# Patient Record
Sex: Female | Born: 1935 | Race: White | Hispanic: No | Marital: Married | Smoking: Never smoker
Health system: Southern US, Community
[De-identification: ages and names within clinical notes are randomized; demographics above are authoritative.]

## PROBLEM LIST (undated history)

## (undated) DIAGNOSIS — R0600 Dyspnea, unspecified: Secondary | ICD-10-CM

## (undated) DIAGNOSIS — I119 Hypertensive heart disease without heart failure: Secondary | ICD-10-CM

## (undated) DIAGNOSIS — Z9289 Personal history of other medical treatment: Secondary | ICD-10-CM

## (undated) DIAGNOSIS — I872 Venous insufficiency (chronic) (peripheral): Secondary | ICD-10-CM

## (undated) DIAGNOSIS — E039 Hypothyroidism, unspecified: Secondary | ICD-10-CM

## (undated) DIAGNOSIS — R51 Headache: Secondary | ICD-10-CM

## (undated) DIAGNOSIS — R519 Headache, unspecified: Secondary | ICD-10-CM

## (undated) DIAGNOSIS — R32 Unspecified urinary incontinence: Secondary | ICD-10-CM

## (undated) DIAGNOSIS — E785 Hyperlipidemia, unspecified: Secondary | ICD-10-CM

## (undated) DIAGNOSIS — M1990 Unspecified osteoarthritis, unspecified site: Secondary | ICD-10-CM

## (undated) DIAGNOSIS — N183 Chronic kidney disease, stage 3 unspecified: Secondary | ICD-10-CM

## (undated) DIAGNOSIS — I10 Essential (primary) hypertension: Secondary | ICD-10-CM

## (undated) DIAGNOSIS — I2510 Atherosclerotic heart disease of native coronary artery without angina pectoris: Secondary | ICD-10-CM

## (undated) DIAGNOSIS — I5032 Chronic diastolic (congestive) heart failure: Secondary | ICD-10-CM

## (undated) DIAGNOSIS — I639 Cerebral infarction, unspecified: Secondary | ICD-10-CM

## (undated) DIAGNOSIS — E669 Obesity, unspecified: Secondary | ICD-10-CM

## (undated) DIAGNOSIS — C801 Malignant (primary) neoplasm, unspecified: Secondary | ICD-10-CM

## (undated) DIAGNOSIS — J189 Pneumonia, unspecified organism: Secondary | ICD-10-CM

## (undated) HISTORY — PX: CHOLECYSTECTOMY: SHX55

## (undated) HISTORY — PX: ABDOMINAL HYSTERECTOMY: SHX81

## (undated) HISTORY — PX: CORONARY STENT PLACEMENT: SHX1402

## (undated) HISTORY — DX: Unspecified osteoarthritis, unspecified site: M19.90

## (undated) HISTORY — PX: OTHER SURGICAL HISTORY: SHX169

## (undated) HISTORY — PX: ROTATOR CUFF REPAIR: SHX139

## (undated) HISTORY — DX: Cerebral infarction, unspecified: I63.9

---

## 1997-10-19 ENCOUNTER — Ambulatory Visit (HOSPITAL_COMMUNITY): Admission: RE | Admit: 1997-10-19 | Discharge: 1997-10-19 | Payer: Self-pay | Admitting: Internal Medicine

## 1998-10-18 ENCOUNTER — Encounter: Payer: Self-pay | Admitting: Internal Medicine

## 1998-10-18 ENCOUNTER — Ambulatory Visit (HOSPITAL_COMMUNITY): Admission: RE | Admit: 1998-10-18 | Discharge: 1998-10-18 | Payer: Self-pay | Admitting: Internal Medicine

## 1999-06-26 ENCOUNTER — Encounter: Payer: Self-pay | Admitting: Internal Medicine

## 1999-06-26 ENCOUNTER — Ambulatory Visit (HOSPITAL_COMMUNITY): Admission: RE | Admit: 1999-06-26 | Discharge: 1999-06-26 | Payer: Self-pay | Admitting: Internal Medicine

## 1999-08-30 ENCOUNTER — Encounter: Payer: Self-pay | Admitting: Cardiology

## 1999-08-30 ENCOUNTER — Encounter: Admission: RE | Admit: 1999-08-30 | Discharge: 1999-08-30 | Payer: Self-pay | Admitting: Cardiology

## 1999-09-04 ENCOUNTER — Ambulatory Visit (HOSPITAL_COMMUNITY): Admission: RE | Admit: 1999-09-04 | Discharge: 1999-09-05 | Payer: Self-pay | Admitting: Cardiology

## 1999-09-04 HISTORY — PX: CARDIAC CATHETERIZATION: SHX172

## 1999-10-25 ENCOUNTER — Encounter: Payer: Self-pay | Admitting: Internal Medicine

## 1999-10-25 ENCOUNTER — Ambulatory Visit (HOSPITAL_COMMUNITY): Admission: RE | Admit: 1999-10-25 | Discharge: 1999-10-25 | Payer: Self-pay | Admitting: Internal Medicine

## 2000-10-28 ENCOUNTER — Ambulatory Visit (HOSPITAL_COMMUNITY): Admission: RE | Admit: 2000-10-28 | Discharge: 2000-10-28 | Payer: Self-pay | Admitting: Internal Medicine

## 2000-10-28 ENCOUNTER — Encounter: Payer: Self-pay | Admitting: Internal Medicine

## 2001-03-25 ENCOUNTER — Ambulatory Visit (HOSPITAL_COMMUNITY): Admission: RE | Admit: 2001-03-25 | Discharge: 2001-03-25 | Payer: Self-pay | Admitting: Cardiovascular Disease

## 2001-03-25 ENCOUNTER — Encounter: Payer: Self-pay | Admitting: Cardiovascular Disease

## 2001-03-25 HISTORY — PX: CARDIAC CATHETERIZATION: SHX172

## 2001-06-18 ENCOUNTER — Encounter: Payer: Self-pay | Admitting: Internal Medicine

## 2001-06-18 ENCOUNTER — Ambulatory Visit (HOSPITAL_COMMUNITY): Admission: RE | Admit: 2001-06-18 | Discharge: 2001-06-18 | Payer: Self-pay | Admitting: Internal Medicine

## 2001-06-23 ENCOUNTER — Encounter: Admission: RE | Admit: 2001-06-23 | Discharge: 2001-06-23 | Payer: Self-pay | Admitting: Internal Medicine

## 2001-06-23 ENCOUNTER — Encounter: Payer: Self-pay | Admitting: Internal Medicine

## 2001-07-11 ENCOUNTER — Encounter: Admission: RE | Admit: 2001-07-11 | Discharge: 2001-07-11 | Payer: Self-pay | Admitting: Family Medicine

## 2001-07-11 ENCOUNTER — Encounter: Payer: Self-pay | Admitting: Internal Medicine

## 2001-09-24 ENCOUNTER — Inpatient Hospital Stay (HOSPITAL_COMMUNITY): Admission: RE | Admit: 2001-09-24 | Discharge: 2001-09-25 | Payer: Self-pay | Admitting: Neurosurgery

## 2001-10-30 ENCOUNTER — Ambulatory Visit (HOSPITAL_COMMUNITY): Admission: RE | Admit: 2001-10-30 | Discharge: 2001-10-30 | Payer: Self-pay | Admitting: Internal Medicine

## 2001-10-30 ENCOUNTER — Encounter: Payer: Self-pay | Admitting: Internal Medicine

## 2002-02-03 ENCOUNTER — Emergency Department (HOSPITAL_COMMUNITY): Admission: EM | Admit: 2002-02-03 | Discharge: 2002-02-03 | Payer: Self-pay | Admitting: *Deleted

## 2002-02-03 ENCOUNTER — Encounter: Payer: Self-pay | Admitting: Emergency Medicine

## 2002-02-10 ENCOUNTER — Ambulatory Visit (HOSPITAL_COMMUNITY): Admission: RE | Admit: 2002-02-10 | Discharge: 2002-02-10 | Payer: Self-pay | Admitting: Internal Medicine

## 2002-02-10 ENCOUNTER — Encounter: Payer: Self-pay | Admitting: Internal Medicine

## 2003-04-08 ENCOUNTER — Encounter: Payer: Self-pay | Admitting: Internal Medicine

## 2003-04-08 ENCOUNTER — Ambulatory Visit (HOSPITAL_COMMUNITY): Admission: RE | Admit: 2003-04-08 | Discharge: 2003-04-08 | Payer: Self-pay | Admitting: Internal Medicine

## 2003-05-04 ENCOUNTER — Ambulatory Visit (HOSPITAL_BASED_OUTPATIENT_CLINIC_OR_DEPARTMENT_OTHER): Admission: RE | Admit: 2003-05-04 | Discharge: 2003-05-04 | Payer: Self-pay | Admitting: Otolaryngology

## 2003-07-20 ENCOUNTER — Ambulatory Visit (HOSPITAL_COMMUNITY): Admission: RE | Admit: 2003-07-20 | Discharge: 2003-07-20 | Payer: Self-pay | Admitting: Otolaryngology

## 2003-07-20 ENCOUNTER — Ambulatory Visit (HOSPITAL_BASED_OUTPATIENT_CLINIC_OR_DEPARTMENT_OTHER): Admission: RE | Admit: 2003-07-20 | Discharge: 2003-07-20 | Payer: Self-pay | Admitting: Otolaryngology

## 2003-07-23 ENCOUNTER — Ambulatory Visit (HOSPITAL_COMMUNITY): Admission: RE | Admit: 2003-07-23 | Discharge: 2003-07-23 | Payer: Self-pay | Admitting: Internal Medicine

## 2004-04-18 ENCOUNTER — Ambulatory Visit (HOSPITAL_COMMUNITY): Admission: RE | Admit: 2004-04-18 | Discharge: 2004-04-18 | Payer: Self-pay | Admitting: Internal Medicine

## 2004-05-24 ENCOUNTER — Other Ambulatory Visit: Admission: RE | Admit: 2004-05-24 | Discharge: 2004-05-24 | Payer: Self-pay | Admitting: Obstetrics and Gynecology

## 2005-04-27 ENCOUNTER — Ambulatory Visit (HOSPITAL_COMMUNITY): Admission: RE | Admit: 2005-04-27 | Discharge: 2005-04-27 | Payer: Self-pay | Admitting: Internal Medicine

## 2005-08-03 ENCOUNTER — Ambulatory Visit (HOSPITAL_COMMUNITY): Admission: RE | Admit: 2005-08-03 | Discharge: 2005-08-03 | Payer: Self-pay | Admitting: Neurosurgery

## 2005-08-14 ENCOUNTER — Encounter: Admission: RE | Admit: 2005-08-14 | Discharge: 2005-08-14 | Payer: Self-pay | Admitting: Orthopedic Surgery

## 2005-08-15 ENCOUNTER — Ambulatory Visit (HOSPITAL_BASED_OUTPATIENT_CLINIC_OR_DEPARTMENT_OTHER): Admission: RE | Admit: 2005-08-15 | Discharge: 2005-08-16 | Payer: Self-pay | Admitting: Orthopedic Surgery

## 2005-08-15 ENCOUNTER — Ambulatory Visit (HOSPITAL_COMMUNITY): Admission: RE | Admit: 2005-08-15 | Discharge: 2005-08-15 | Payer: Self-pay | Admitting: Orthopedic Surgery

## 2006-06-18 ENCOUNTER — Ambulatory Visit (HOSPITAL_COMMUNITY): Admission: RE | Admit: 2006-06-18 | Discharge: 2006-06-18 | Payer: Self-pay | Admitting: Internal Medicine

## 2006-08-27 ENCOUNTER — Ambulatory Visit (HOSPITAL_COMMUNITY): Admission: RE | Admit: 2006-08-27 | Discharge: 2006-08-27 | Payer: Self-pay | Admitting: Internal Medicine

## 2006-08-29 ENCOUNTER — Ambulatory Visit: Payer: Self-pay | Admitting: Gastroenterology

## 2006-09-12 ENCOUNTER — Ambulatory Visit: Payer: Self-pay | Admitting: Gastroenterology

## 2006-09-25 ENCOUNTER — Ambulatory Visit: Payer: Self-pay | Admitting: Internal Medicine

## 2006-09-27 ENCOUNTER — Ambulatory Visit: Payer: Self-pay | Admitting: Internal Medicine

## 2006-11-07 ENCOUNTER — Ambulatory Visit: Payer: Self-pay | Admitting: Internal Medicine

## 2007-07-10 ENCOUNTER — Ambulatory Visit (HOSPITAL_COMMUNITY): Admission: RE | Admit: 2007-07-10 | Discharge: 2007-07-10 | Payer: Self-pay | Admitting: Internal Medicine

## 2008-04-29 ENCOUNTER — Encounter: Admission: RE | Admit: 2008-04-29 | Discharge: 2008-04-29 | Payer: Self-pay | Admitting: Internal Medicine

## 2008-09-02 ENCOUNTER — Encounter: Admission: RE | Admit: 2008-09-02 | Discharge: 2008-09-02 | Payer: Self-pay | Admitting: Internal Medicine

## 2008-09-17 HISTORY — PX: BLADDER SURGERY: SHX569

## 2009-03-23 ENCOUNTER — Encounter: Admission: RE | Admit: 2009-03-23 | Discharge: 2009-03-23 | Payer: Self-pay | Admitting: Internal Medicine

## 2009-05-02 ENCOUNTER — Ambulatory Visit (HOSPITAL_BASED_OUTPATIENT_CLINIC_OR_DEPARTMENT_OTHER): Admission: RE | Admit: 2009-05-02 | Discharge: 2009-05-02 | Payer: Self-pay | Admitting: Urology

## 2009-05-30 ENCOUNTER — Ambulatory Visit (HOSPITAL_COMMUNITY): Admission: RE | Admit: 2009-05-30 | Discharge: 2009-05-30 | Payer: Self-pay | Admitting: Urology

## 2009-06-29 ENCOUNTER — Ambulatory Visit (HOSPITAL_COMMUNITY): Admission: RE | Admit: 2009-06-29 | Discharge: 2009-06-30 | Payer: Self-pay | Admitting: Urology

## 2009-07-28 ENCOUNTER — Ambulatory Visit (HOSPITAL_BASED_OUTPATIENT_CLINIC_OR_DEPARTMENT_OTHER): Admission: RE | Admit: 2009-07-28 | Discharge: 2009-07-28 | Payer: Self-pay | Admitting: Urology

## 2009-09-14 ENCOUNTER — Ambulatory Visit (HOSPITAL_COMMUNITY): Admission: RE | Admit: 2009-09-14 | Discharge: 2009-09-14 | Payer: Self-pay | Admitting: Internal Medicine

## 2010-01-31 ENCOUNTER — Ambulatory Visit
Admission: RE | Admit: 2010-01-31 | Discharge: 2010-01-31 | Payer: Self-pay | Source: Home / Self Care | Admitting: Urology

## 2010-02-09 ENCOUNTER — Ambulatory Visit (HOSPITAL_BASED_OUTPATIENT_CLINIC_OR_DEPARTMENT_OTHER): Admission: RE | Admit: 2010-02-09 | Discharge: 2010-02-09 | Payer: Self-pay | Admitting: Urology

## 2010-07-31 ENCOUNTER — Encounter: Admission: RE | Admit: 2010-07-31 | Discharge: 2010-07-31 | Payer: Self-pay | Admitting: Internal Medicine

## 2010-10-17 ENCOUNTER — Ambulatory Visit (HOSPITAL_COMMUNITY)
Admission: RE | Admit: 2010-10-17 | Discharge: 2010-10-17 | Payer: Self-pay | Source: Home / Self Care | Attending: Internal Medicine | Admitting: Internal Medicine

## 2010-12-04 LAB — POCT I-STAT 4, (NA,K, GLUC, HGB,HCT)
Glucose, Bld: 107 mg/dL — ABNORMAL HIGH (ref 70–99)
HCT: 44 % (ref 36.0–46.0)
Hemoglobin: 15 g/dL (ref 12.0–15.0)
Potassium: 3.4 mEq/L — ABNORMAL LOW (ref 3.5–5.1)
Sodium: 141 mEq/L (ref 135–145)

## 2010-12-04 LAB — POCT HEMOGLOBIN-HEMACUE: Hemoglobin: 15.6 g/dL — ABNORMAL HIGH (ref 12.0–15.0)

## 2010-12-20 LAB — POCT I-STAT 4, (NA,K, GLUC, HGB,HCT)
Glucose, Bld: 107 mg/dL — ABNORMAL HIGH (ref 70–99)
HCT: 48 % — ABNORMAL HIGH (ref 36.0–46.0)
Hemoglobin: 16.3 g/dL — ABNORMAL HIGH (ref 12.0–15.0)
Potassium: 3.4 mEq/L — ABNORMAL LOW (ref 3.5–5.1)
Sodium: 137 mEq/L (ref 135–145)

## 2010-12-21 LAB — TYPE AND SCREEN
ABO/RH(D): A POS
Antibody Screen: NEGATIVE

## 2010-12-21 LAB — COMPREHENSIVE METABOLIC PANEL
ALT: 37 U/L — ABNORMAL HIGH (ref 0–35)
AST: 42 U/L — ABNORMAL HIGH (ref 0–37)
Albumin: 3.7 g/dL (ref 3.5–5.2)
Alkaline Phosphatase: 59 U/L (ref 39–117)
BUN: 19 mg/dL (ref 6–23)
CO2: 27 mEq/L (ref 19–32)
Calcium: 9.3 mg/dL (ref 8.4–10.5)
Chloride: 103 mEq/L (ref 96–112)
Creatinine, Ser: 1.19 mg/dL (ref 0.4–1.2)
GFR calc Af Amer: 54 mL/min — ABNORMAL LOW (ref >60)
GFR calc non Af Amer: 44 mL/min — ABNORMAL LOW (ref >60)
Glucose, Bld: 106 mg/dL — ABNORMAL HIGH (ref 70–99)
Potassium: 4.4 mEq/L (ref 3.5–5.1)
Sodium: 137 mEq/L (ref 135–145)
Total Bilirubin: 1 mg/dL (ref 0.3–1.2)
Total Protein: 6.7 g/dL (ref 6.0–8.3)

## 2010-12-21 LAB — CBC
HCT: 44.7 % (ref 36.0–46.0)
Hemoglobin: 15.1 g/dL — ABNORMAL HIGH (ref 12.0–15.0)
MCHC: 33.7 g/dL (ref 30.0–36.0)
MCV: 95.8 fL (ref 78.0–100.0)
Platelets: 239 10*3/uL (ref 150–400)
RBC: 4.67 MIL/uL (ref 3.87–5.11)
RDW: 13.2 % (ref 11.5–15.5)
WBC: 7.9 10*3/uL (ref 4.0–10.5)

## 2010-12-21 LAB — PROTIME-INR
INR: 1.04 (ref 0.00–1.49)
Prothrombin Time: 13.5 seconds (ref 11.6–15.2)

## 2010-12-21 LAB — APTT: aPTT: 24 seconds (ref 24–37)

## 2010-12-21 LAB — ABO/RH: ABO/RH(D): A POS

## 2010-12-22 LAB — BASIC METABOLIC PANEL
BUN: 15 mg/dL (ref 6–23)
CO2: 27 mEq/L (ref 19–32)
Calcium: 8.9 mg/dL (ref 8.4–10.5)
Chloride: 102 mEq/L (ref 96–112)
Creatinine, Ser: 0.87 mg/dL (ref 0.4–1.2)
GFR calc Af Amer: >60 mL/min (ref >60)
GFR calc non Af Amer: >60 mL/min (ref >60)
Glucose, Bld: 110 mg/dL — ABNORMAL HIGH (ref 70–99)
Potassium: 3.7 mEq/L (ref 3.5–5.1)
Sodium: 137 mEq/L (ref 135–145)

## 2010-12-22 LAB — HEMOGLOBIN AND HEMATOCRIT, BLOOD
HCT: 43.2 % (ref 36.0–46.0)
Hemoglobin: 14.6 g/dL (ref 12.0–15.0)

## 2011-01-30 NOTE — Op Note (Signed)
Jill Preston, Jill Preston             ACCOUNT NO.:  [id number]   MEDICAL RECORD NO.:  [id number]          PATIENT TYPE:  AMB   LOCATION:  [facility]                         FACILITY:  [facility]   PHYSICIAN:  Bertram Millard. Dahlstedt, M.D.DATE OF BIRTH:  07-06-1933   DATE OF PROCEDURE:  05/02/2009  DATE OF DISCHARGE:                               OPERATIVE REPORT   PREOPERATIVE DIAGNOSIS:  Stress urinary incontinence.   POSTOPERATIVE DIAGNOSIS:  Stress urinary incontinence.   PROCEDURE:  Lynx suprapubic sling.   SURGEON:  Bertram Millard. Dahlstedt, M.D.   ANESTHESIA:  General with LMA.   COMPLICATIONS:  None.   BRIEF HISTORY:  The patient is a 77 year old female with significant  urinary leakage.  She has mixed incontinence, and has fair response  anticholinergics.  Urodynamics revealed decreased bladder capacity, but  she had a compliant bladder.  There was stress urinary incontinence as  well as unstable contractions.  The patient desires to be aggressive  with management of her leakage.  She is aware that a sling would fix the  stress incontinence, but that she would more than likely need  anticholinergics to improve her leakage as well.  Risks and  complications of the procedure have been discussed with the patient in  full.  She understands these and desires to proceed.   DESCRIPTION OF PROCEDURE:  The patient was administered Cipro initially.  After a small amount of this drug was infused intravenously, there was  an urticarial reaction.  This was stopped.  She was then given 80 mg of  gentamicin IV.  The patient was identified and then taken to the  operating room where general ended was administered using LMA.  She was  placed in the dorsal lithotomy position.  Genitalia and perineum as well  as lower abdomen were prepped with a non-iodine containing solution.  She was draped.  Her bladder was drained with a Foley catheter, 39-  [place], that was left in place.  The balloon was evident at  the bladder  neck palpably through the vagina.  Two small punctures were made on  either side of the midline just overlying the pubic arch.  A Deaver was  then placed in the vagina, as the weighted speculum could not be placed  due to introital stenosis.  The urethral meatus was grasped with an  Allis clamp.  The submucosal tissue in the vagina anteriorly was  infiltrated with 1% lidocaine with epinephrine, approximately 4 mL were  used.  A midline incision was made along the anterior vaginal wall in  the midline just overlying the urethra.  This was dissected carefully  bilaterally underneath the vaginal epithelium.  I dissected up to the  pubocervical fascia bilaterally which could be easily palpable.  The  vaginal incision was approximately 2 cm long.  The tissue was quite  thin.  I then passed the retropubic needles through each puncture wound  through the space of Retzius, with the needles carefully hugging the  pubic bone.  These were brought out through the corresponding locations  on either side of the periurethral incision.  The bladder was then  inspected with a 70 degrees lens.  No punctures were seen within the  bladder.  The urethra was normal as well.  The lynx sling was then  grasped and brought through the retropubic space by pulling the needles  anteriorly.  The sheath was cut and the plastic sheath removed.  The  sling was left in place with a fair amount of slack between the urethra  and the sling.  This was enough slack to easily admit a right angle with  extra space in between the sling and the right angle clamp.  The vaginal  mucosa was then irrigated.  It was carefully closed with a running 2-0  Vicryl.  Care was taken to evert the skin edges to avoid extrusion of  the sling later on.  The sling was then clipped below the skin  bilaterally and pubic area with the sling pushed into the subdermal  tissue.  Dermabond was used to close the small puncture sites.  A   Estrace impregnated pack was then left within the vagina.  Approximately  150 mL of water was left in the bladder following removal of the  cystoscope.  The procedure was then terminated.  The patient was  awakened and taken to PACU in stable condition.      Bertram Millard. Dahlstedt, M.D.  Electronically Signed     SMD/MEDQ  D:  05/02/2009  T:  05/02/2009  Job:  [id number]

## 2011-02-02 NOTE — Op Note (Signed)
NAME:  Jill Preston, RISK NO.:  [id number]   MEDICAL RECORD NO.:  [id number]          PATIENT TYPE:  AMB   LOCATION:  DSC                          FACILITY:  [facility]   PHYSICIAN:  Lockie Pares, M.D.    DATE OF BIRTH:  1933/01/27   DATE OF PROCEDURE:  08/15/2005  DATE OF DISCHARGE:                                 OPERATIVE REPORT   INDICATION:  The patient is a 77 year old female who presented to my office  with intractable shoulder pain and MRI-proven symptomatic rotator cuff tear  felt to be amenable to outpatient surgery.   PREOPERATIVE DIAGNOSES:  1.  Torn right rotator cuff (5-cm tear).  2.  Impingement.  3.  Acromioclavicular joint arthritis.  4.  Degenerative tear of anterior and superior labrum with involvement of      biceps.   OPERATION:  1.  Open rotator cuff repair and open acromioplasty.  2.  Arthroscopic debridement of torn labrum.  3.  Open excision of distal clavicle.   SURGEON:  Lockie Pares, M.D.   ANESTHESIA:  General.   DESCRIPTION OF PROCEDURE:  Sterile prep and drape.  Normal range of motion,  no instability.  She was arthroscoped through a posterior, lateral and  anterior portal.  Intra-articular inspection showed no degenerative changes  of the glenoid or humerus.  Attritional-type tearing with flattening of the  biceps, degenerative tear of the labrum, extensive debridement carried out,  large rotator cuff identified.  Procedure quickly converted to an open  procedure, incision bisecting the [facility] joint acromion, splitting the deltoid 2  cm distal to the tip of the acromion, excision of very hypertrophic distal  clavicle as well as a severely thickened an type 3 acromion.  Cuff tear was  freshened with a 15 blade.  Bursectomy carried out, tuberosity freshened,  followed by insertion of two 5.5-mm Arthrex absorbable anchors, each with #2  FiberWires attached with a total of 4 sutures to create an essentially  watertight repair of the cuff  under no tension.  Irrigation was carried out.  Closure of the deltoid, which had been split, with meticulous repair with #1  Tycron including 2 sutures through drill holes, lightly compressive sterile  dressing, mini-pillows were applied, taken to the recovery room in stable  condition.      Lockie Pares, M.D.  Electronically Signed     WDC/MEDQ  D:  08/15/2005  T:  08/16/2005  Job:  [id number]

## 2011-02-02 NOTE — Op Note (Signed)
Jill Preston, Jill Preston                         ACCOUNT NO.:  [id number]   MEDICAL RECORD NO.:  [id number]                   PATIENT TYPE:  AMB   LOCATION:  DSC                                  FACILITY:  [facility]   PHYSICIAN:  Christopher E. Ezzard Standing, M.D.         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  Turbinate hypertrophy.   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  Turbinate hypertrophy.   OPERATION PERFORMED:  Septoplasty with bilateral inferior turbinate  reductions.   SURGEON:  Kristine Garbe. Ezzard Standing, M.D.   ANESTHESIA:  General endotracheal.   COMPLICATIONS:  None.   INDICATIONS FOR PROCEDURE:  Jill Preston is a 77 year old female who  apparently fell on her face and nose back in December.  Since that time, she  has had difficulty breathing through the left side of her nose.  On  examination, she has a significant septal deviation to the left with 70 to  80% closure of the left nasal passageway compared to the right.  She has  moderate sized turbinates.  The patient was taken to the operating room at  this time for septoplasty and turbinate reductions.   DESCRIPTION OF PROCEDURE:  After adequate endotracheal anesthesia, the  patient received 1g Ancef IV preoperatively.  The nose was prepped with  Betadine solution and draped out with sterile towels.  The nose was then  injected with Xylocaine with epinephrine for hemostasis. The patient  had a  severe deviation of the septum to the left, obstructing the left nasal  airway.  A hemitransfixion incision was made along the caudal edge of the  septum on the left side, mucoperichondrial and mucoperiosteal flaps were  elevated posteriorly.  The portion of the septum that was deviated was  mostly at the juncture of the  bony cartilaginous septum.  This portion of  the septum was removed  after elevating mucoperichondrial and mucoperiosteal  flaps on either side.  This allowed the remaining septum to return more to  midline.  In addition, the patient had a portion of the cartilaginous septum  bowed into the right nasal airway off of the maxillary crest and this  portion protruding into the right airway was excised and cauterized.  Hemitransfixion incision was closed with interrupted 5-0 chromic sutures.  The septum was basted with a 3-0 chromic suture.  Next, inferior turbinate  reductions were performed on the right side.  ______ was used to perform  submucosal bipolar cauterization of the inferior turbinate.  On the left  side, incision was made along the inferior edge of the turbinate.  Mucosal  flaps were elevated off of the turbinate bone and turbinate bone was  removed.  After removing turbinate bone, submucosal cauterization was  performed.  The remaining turbinate tissue was outfractured.  This completed  the procedure.  Splints were secured to either side of the septum with a 3-0  nylon suture.  Nose was then packed with Telfa soaked in bacitracin  ointment.    DISPOSITION:  Jill Preston is discharged home later this morning on  Tylenol and Vicodin as needed for pain, Keflex 500 mg twice daily for one  week.  Will have her follow up in my office tomorrow to have her nasal packs  removed.                                               Kristine Garbe. Ezzard Standing, M.D.    CEN/MEDQ  D:  05/04/2003  T:  05/04/2003  Job:  [id number]   cc:   Lucky Cowboy, M.D.  [address]  [place], [place] [address]  Fax: [phone]

## 2011-02-02 NOTE — Assessment & Plan Note (Signed)
[place]                             PULMONARY OFFICE NOTE   SHANIGUA, DURELL                    MRN:          [id number]  DATE:09/25/2006                            DOB:          [birth year]    PULMONARY/NEW PATIENT EVALUATION   CHIEF COMPLAINT:  Dyspnea.   HISTORY:  A 77 year old white female who has been having trouble with  her breathing for 3 or 4 years and typically loses her voice when she  loses her breath. This happened again around Thanksgiving and left her  with a persistent sensation that she could not get her breath. She  also had profound hoarseness. She tells me that she was evaluated by Dr.  Radene Journey and was found to have no problems. She was not treated  with anything and ended up in the ER and admitted to [facility]  five days ago with elevated blood pressure, but I gather no pulmonary  medicines. For some reason, she was discharged on prednisone however.  She says she is better since the prednisone was started. She denies any  pleuritic pain, fevers, chills, sweats, orthopnea, PND or leg swelling.   PAST MEDICAL HISTORY:  Significant for:  1. Remote cholecystectomy.  2. Neck surgery.  3. Angioplasty.  4. Hysterectomy.  5. She also has a history of hypertension, but denies being on ACE      inhibitors.   MEDICATIONS:  Reviewed in detail on the worksheet. Significant for the  fact that she is not on ACE inhibitors. See column dated September 25, 2006, for details.   SOCIAL HISTORY:  She has never smoked. Denies any unusual travel, pet or  hobby exposure.   FAMILY HISTORY:  Is recorded in detail on the worksheet. Significant for  the absence of respiratory diseases.   REVIEW OF SYSTEMS:  Taken in detail on the worksheet. Significant only  for upper airway complaints.   PHYSICAL EXAMINATION:  This is a hoarse, ambulatory, white female with  classic voice fatigue. She is afebrile with stable vital signs.  HEENT: Reveals upper and lower dentures in place. Oropharynx was  completely clear without any evidence of excessive post-nasal drainage  or cobblestoning.  NECK: Supple without cervical adenopathy or tenderness. Trachea is  midline. No thyromegaly.  Lung fields perfectly clear bilaterally to auscultation and percussion.  She had classic pseudo wheeze.  HEART: Regular rate and rhythm without murmur, gallop or rub present.  ABDOMEN: Soft, benign.  EXTREMITIES: Warm without calf tenderness, cyanosis, clubbing or edema.   IMPRESSION:  Upper airways dysfunction, either functional related to  reflux and interestingly at least partially responsive to prednisone. I  would have bet that she was on ACE inhibitors, but she denied this and  did give a very accurate inventory of all of her medications.   Since ACE inhibitors and sinus disease do not appear to be contributing  to her upper airway problem, I am going to recommend treatment directed  at reflux namely use Zegerid 40 mg at bedtime, along with dietary  modifications and followup here in 4 to 6 weeks with PFTs.  If her  condition deteriorates in the meantime, I have asked her to return here  immediately.   In the meantime, I will try to obtain all of her records from her recent  [facility] admission which included apparently every x-ray and blood test  you can name (she actually could name none of them).   09/26/2006 Reviewed record from [facility] indicates she was admitted on  quinapril and discharged off it. Contacted patient by phone who says she  thinks she's still taking it and doesn't have the discharge list of meds  from [facility] and doesn't really know what to take. I asked her to stop  the quinapril (she's already taken today's dose) and return tomorrow for  med reconciliation.     Christena Deem. Melvyn Novas, MD, [facility]  Electronically Signed    MBW/MedQ  DD: 09/25/2006  DT: 09/25/2006  Job #: [id number]

## 2011-02-02 NOTE — Op Note (Signed)
[place]  Patient:    AIDALY, CORDNER Visit Number: [id number] MRN: [id number]          Service Type: SUR Location: [id number] Attending Physician:  Cristi Loron Proc. Date: 09/24/01 Admit Date:  09/24/2001 Discharge Date: 09/25/2001                             Operative Report  INDICATION:  The patient is a 77 year old white female who suffers from neck and arm pain.  She failed medical management and was worked up as an outpatient with a cervical MRI which demonstrated a herniated disk with spondylosis at C5-6 and C6-7.  I discussed the various treatment options with her including surgery.  The patient weighed the benefits, benefits, and alternatives to surgery, and decided to proceed with the operation.  PREOPERATIVE DIAGNOSES:  C5-6 and C6-7 spondylosis, herniated stenosis, and cervical radiculopathy.  POSTOPERATIVE DIAGNOSES:  C5-6 and C6-7 spondylosis, herniated stenosis, and cervical radiculopathy.  PROCEDURE PERFORMED:  C5-6 and C6-7 extensive anterior cervical diskectomy, interbody iliac crest allograft arthrodesis, anterior cervical plating (Codman titanium plate and screws).  SURGEON:  Cristi Loron, M.D.  ASSISTANT:  Stefani Dama, M.D.  ANESTHESIA:  General endotracheal.  ESTIMATED BLOOD LOSS:  150 cc.  SPECIMENS:  None.  DRAINS:  None.  COMPLICATIONS:  None.  DESCRIPTION OF PROCEDURE:  The patient was brought to the operating room by the anesthesia team.  General endotracheal anesthesia was induced.  The patient remained in the supine position.  A roll was placed under her shoulders to place her neck in slight extension.  Her anterior cervical region was then prepared with Betadine scrub and Betadine solution.  Sterile drapes were applied.  I then injected the area to be incised with Marcaine with epinephrine solution.  I then made the transverse incision in the patients left anterior neck.  I  used the Metzenbaum scissors to divide the platysma muscle and divide medial to the sternocleidomastoid muscle, jugular vein, and carotid artery.  I bluntly dissected down towards the anterior cervical spine carefully identifying the esophagus and retracting it medially.  I cleared the soft tissues from the anterior cervical spine using swabs and then inserted a bent spinal needle into the upper exposed interspace.  I obtained intraoperative radiographs to confirm our location and then used electrocautery to detach the medial border of the longus coli muscle bilaterally from the C5-6 and C6-7 intervertebral disk.  The caspar self-retaining retractor for exposure.  I then incised the C5-6 intervertebral disk and performed the partial diskectomy using the pituitary forceps.  I inserted distraction screws at C5 and C6.  I then distracted the interspace. I then used a high speed drill to decorticate the vertebral end plate of M5-7 and drilled away the remainder of the intervertebral disk.  I then brought out the partial posterior longitudinal ligament with the drill and drilled out some bone spurs from the posterior bone edges.  I then incised the posterior longitudinal ligament with the knife and removed it with Kerrison punch undercutting the vertebral end plate of Q4-6, decompressing the thecal sac.  I then performed a foraminotomy about the bilateral C6 nerve root.  I then repeated the procedure at C6-7.  I removed the distraction screw at C5 placed at C7, distracted the C6-7 interspace, incising the vertebral disk for a partial diskectomy with the pituitary forceps and karlin curets using a high speed drill  to decorticate the vertebral end plate of Z6-1, thinning out the posterior longitudinal ligament, and drilled away some bone spurs.  I incised the ligament with the knife and then removed it with a Kerrison punch on the vertebral end plate of W9-6 decompressing the thecal sac.  I then  performed a foraminotomy about the bilateral C7 nerve root.  I completed the diskectomy and I then turned my attention to the anterior spinal fusion/arthrodesis.  I obtained the iliac crest tricortical allograft bone graft and fashioned them to their proximal dimensions.  Approximately 7 height and 1 cm in depth.  I inserted one bone graft in the tract of C6-7 interspace and then removed it from C7 and placed it back in C5, distracted the C5-6 interspace and placed the bone graft into the C5-6 interspace.  There was a good fit of bone graft at both levels.  I then turned my attention to the anterior spinal instrumentation.  I obtained the appropriate length of the anterior cervical plate, and laid it along the anterior aspect of the vertebral body from C5 to C7 and then drilled two holes in C5, two in C6, two in C7, tapped the holes, and secured the plate to the vertebral bodies with two 15 mm screws at each vertebral body.  I then obtained a radiograph with limited exposure particularly with the lower edge of the plate, but it looked to be in good position.  I then secured the screws to the plate using the can tightener at each screws and achieved hemostasis using bipolar electrocautery and Gelfoam.  I removed the Caspar retractor and then inspected the esophagus for any damage, there was none apparent.  I then reapproximated the patients platysma muscle with interrupted 3-0 Vicryl suture, subcutaneous tissue with interrupted 3-0 Vicryl suture, and the skin with Steri-Strips and Benzoin. The wound was then coated with bacitracin ointment.  A sterile dressing was applied.  The patient was subsequently extubated by the anesthesia team and transported to the post anesthesia care unit in stable condition.  All sponge, instrument, and needle counts were correct at the end of the case. Attending Physician:  Tressie Stalker D DD:  09/24/01 TD:  09/25/01 Job: [id number]

## 2011-02-02 NOTE — Cardiovascular Report (Signed)
[place]  Patient:    Jill Preston, Jill Preston                    MRN: [id number] Proc. Date: 03/25/01 Adm. Date:  [id number] Attending:  Virgina Evener CC:         Madaline Savage, M.D.  Tildon Husky, M.D.   Cardiac Catheterization  INDICATIONS:  The patient is a 77 year old, white female, patient of Dr. Elsie Lincoln, who had previous undergone PTCA and stenting of the proximal to mid LAD in December of 2000.  The patient had recently developed chest and left arm discomfort.  She saw Dr. Elsie Lincoln yesterday in the office.  She had recently had a Cardiolite stress test suggesting possible mild anterior wall ischemia.  She is now referred for definitive diagnostic catheterization.  PROCEDURES:  Left heart catheterization, cine coronary angiography, biplane left ventriculography, distal aortography.  HEMODYNAMIC DATA:  Central aortic pressure 138/59, mean 92, left ventricular pressure 138/14, post A wave 25.  ANGIOGRAPHIC DATA:  Left main coronary artery:  The left main coronary artery was a long normal vessel that trifurcated into an LAD, a ramus intermediate branch and a left circumflex coronary artery.  Left anterior descending:  The LAD extended to the LV apex.  Between the first and second diagonal vessel was the site of the prior LAD stent.  This was widely patent with a residual narrowing of no greater than 10%.  There was a midportion of the LAD that seemed to dip intramyocardial.  There was no significant stenoses.  The ramus intermediate vessel was angiographically normal.  Circumflex artery:  The circumflex vessel was angiographically normal and gave rise to one marginal vessel.  Right coronary artery:  The right coronary artery was angiographically normal and had a proximal conus branch.  The vessel supplied the PDA, two inferior lv branches and ended as a posterolateral vessel.  The RCA was  angiographically normal.  Biplane cine left ventriculography revealed normal LV function without focal segmental wall motion abnormality.  DISTAL AORTOGRAPHY:  Distal aortography did not demonstrate any renal artery stenosis in this patient with a history of hypertension.  There was no significant aortoiliac disease.  IMPRESSION: 1. Normal left ventricular function. 2. No evidence for re-stenosis at the prior proximal left anterior descending    stent site with residual narrowing of less than 10%, intimal hyperplasia. 3. Normal ramus intermediate, circumflex and right coronary arteries. 4. No evidence for renal artery stenosis. DD:  03/25/01 TD:  03/25/01 Job: [id number]

## 2011-02-02 NOTE — Assessment & Plan Note (Signed)
[facility]                             PULMONARY OFFICE NOTE   Jill Preston, Jill Preston                    MRN:          [id number]  DATE:11/07/2006                            DOB:          December 26, 1932    HISTORY:  This patient was initially seen on September 25, 2006, with  choking spells that I thought were consistent with upper airway  obstruction from either reflux or ACE inhibitors.  She did not actually  give the history that she was on Quinapril but we found out later that  she did, indeed, take it and on the 11th, it was stopped.  We treated  her empirically for GERD with Zegerid 40 mg at bedtime and she says she  is 100% better at this point; however, she does continue to audibly  clear her throat during the interview and exam.  Her husband says she  notices it but she actually does not perceive it as a problem.  She  denies any excess nasal complaints, overt reflux symptoms, fevers,  chills, sweats, orthopnea, PND, or leg swelling.   For a full list of medications, please see face sheet on the column  dated November 07, 2006, noting that she is now on Benicar 40 mg per day  in the place of Quinapril, and note that her medications now are  reflected on a medication calendar which corresponds 100% to the  medication face sheet on the column dated November 07, 2006, and was  reviewed with her in detail and is correct as listed.   PHYSICAL EXAMINATION:  GENERAL:  She is an obese ambulatory white female  in no acute distress who clears her throat frequently during the exam.  VITAL SIGNS:  Blood pressure 126/82.  HEENT:  Her oropharynx is clear without any excessive post nasal  drainage or cobblestoning.  NECK:  Supple without cervical adenopathy or tenderness.  Trachea is  midline.  LUNGS:  Fields clear bilaterally to auscultation and percussion.  HEART:  Regular rate and rhythm without murmur, gallop or rub.  ABDOMEN:  Soft, benign.  EXTREMITIES:  Warm without calf tenderness, cyanosis, clubbing, or  edema.   IMPRESSION:  1. Continued throat pain is the only finding six weeks after stopping      Quinapril with no further spells of dyspnea or cough.  At this      point, I am not sure reflux is even an issue and recommended she      empirically stop the Zegerid to see if the problem flares.  If so,      a GI referral may be necessary.  2. Blood pressure is well controlled on Benicar in the place of      Quinapril.  3. I did review lifestyle issues with her, as well, in terms of diet      and encouraged her to use sugarless candy instead of menthol      containing lozenges to suppress the urge to clear her throat.   I also reviewed with her her PFTs today which are basically normal  except for slight truncation of the  inspiratory loop, which would  normally give me concern but note that Dr. Radene Journey has already  evaluated her upper airway and feels that it is normal.   The patient plans to establish with Dr. Tomasa Hosteller.  I will send him this  note which summarizes my previous evaluation.  Pulmonary follow up can  be p.r.n.     Jill Preston. Melvyn Novas, MD, [facility]  Electronically Signed    MBW/MedQ  DD: 11/07/2006  DT: 11/07/2006  Job #: [id number]   cc:   Ardeen Jourdain, M.D.

## 2011-02-02 NOTE — Assessment & Plan Note (Signed)
[facility]                             PULMONARY OFFICE NOTE   Jill Preston, Jill Preston                    MRN:          [id number]  DATE:09/27/2006                            DOB:          03-27-1933    Patient is a 77 year old white female patient of Dr. Thurston Hole who was seen  in the office 2 days ago for pulmonary consult for worsening shortness  of breath and cough.  Patient was recently admitted at [facility]  from January 4-January 7 for atypical chest pain with rule out cardiac  event, and was ruled out for cardiac event.  Patient also had an acute  tracheobronchitis, and was treated with antibiotics and prednisone  taper.  Since discharge, patient reports that she has had decreased  cough, congestion, and shortness of breath.  Patient does complain that  she continues to have a persistent hoarseness and throat clearing.  Patient has brought all of her medications in today for review.  Patient  had been suspected to have some upper airway instability, possibly  secondary to reflux.  Zegerid was added into her regimen 2 days ago.  Patient denies any chest pain, purulent sputum, fever, orthopnea, PND or  leg swelling.  Patient has brought all of her medications in today for  review.  Patient has multiple discrepancies, including several old  bottles of medications, and several medications that were not previously  listed on her medication list.   PAST MEDICAL HISTORY:  Reviewed.   CURRENT MEDICATIONS:  Reviewed.   PHYSICAL EXAMINATION:  Patient is a pleasant female in no acute  distress.  She is afebrile with stable vital signs.  O2 saturation is  94% on room air.  HEENT:  Posterior pharynx is clear without any exudate or redness.  TMs  are normal.  EACs are clear.  Nasal mucosa is slightly pale with some  turbinate edema.  NECK:  Supple without cervical adenopathy.  No JVD.  LUNGS:  Clear to auscultation bilaterally without any wheezes,  no  crackles.  CARDIAC:  Regular rate and rhythm.  ABDOMEN:  Soft without any hepatosplenomegaly, no guarding or rebound.  EXTREMITIES:  Warm without any calf tenderness, cyanosis, clubbing, or  edema.   IMPRESSION:  1. Upper airway instability, possibly secondary to reflux and/or      angiotensin-converting enzyme inhibitor.  Patient will stop her      quinapril presently and begin Benicar 40 mg daily.  She will      continue on Zegerid at bedtime.  Patient also has a sample of      Advair which she is unsure if she has been taking or not.  I      recommended to hold on this medication for now since it is not      listed on any of her medication lists nor her discharge summary      from [facility].  This can also contribute to upper airway      instability.  Patient does have a followup appointment in 4 weeks      for a scheduled pulmonary function test, and  we will determine if      any inhalers are needed at that time.  2. Hypertension.  Will recheck blood pressure at followup visit in 4      weeks on new medication of Benicar 40 daily.  3. Complex medication regimen.  Patient's medications were reviewed in      detail.  Patient education was provided.  Patient, as above, has      multiple discrepancies in her medicines.  Patient has discarded all      medication bottles that are expired, and has been given a      computerized medication list of her medications, and patient      verbalizes understanding and awareness to bring list back to each      and every visit.      Rubye Oaks, NP  Electronically Signed      Charlaine Dalton. Sherene Sires, MD, [facility]  Electronically Signed   TP/MedQ  DD: 10/01/2006  DT: 10/01/2006  Job #: [id number]

## 2011-02-27 ENCOUNTER — Other Ambulatory Visit: Payer: Self-pay | Admitting: Adult Health Nurse Practitioner

## 2011-02-27 ENCOUNTER — Ambulatory Visit
Admission: RE | Admit: 2011-02-27 | Discharge: 2011-02-27 | Disposition: A | Discharge status: Home / Self Care | Payer: Medicare Other | Source: Ambulatory Visit | Attending: Adult Health Nurse Practitioner | Admitting: Adult Health Nurse Practitioner

## 2011-02-27 DIAGNOSIS — R52 Pain, unspecified: Secondary | ICD-10-CM

## 2011-05-01 ENCOUNTER — Ambulatory Visit (HOSPITAL_BASED_OUTPATIENT_CLINIC_OR_DEPARTMENT_OTHER)
Admission: RE | Admit: 2011-05-01 | Discharge: 2011-05-01 | Disposition: A | Discharge status: Home / Self Care | Payer: Medicare Other | Source: Ambulatory Visit | Attending: Urology | Admitting: Urology

## 2011-05-01 ENCOUNTER — Ambulatory Visit (HOSPITAL_COMMUNITY): Payer: Medicare Other | Attending: Urology

## 2011-05-01 DIAGNOSIS — Z01812 Encounter for preprocedural laboratory examination: Secondary | ICD-10-CM | POA: Insufficient documentation

## 2011-05-01 DIAGNOSIS — N3941 Urge incontinence: Secondary | ICD-10-CM | POA: Insufficient documentation

## 2011-05-01 DIAGNOSIS — I10 Essential (primary) hypertension: Secondary | ICD-10-CM | POA: Insufficient documentation

## 2011-05-01 DIAGNOSIS — Y831 Surgical operation with implant of artificial internal device as the cause of abnormal reaction of the patient, or of later complication, without mention of misadventure at the time of the procedure: Secondary | ICD-10-CM | POA: Insufficient documentation

## 2011-05-01 DIAGNOSIS — R9431 Abnormal electrocardiogram [ECG] [EKG]: Secondary | ICD-10-CM | POA: Insufficient documentation

## 2011-05-01 DIAGNOSIS — Z79899 Other long term (current) drug therapy: Secondary | ICD-10-CM | POA: Insufficient documentation

## 2011-05-01 DIAGNOSIS — T85890A Other specified complication of nervous system prosthetic devices, implants and grafts, initial encounter: Secondary | ICD-10-CM | POA: Insufficient documentation

## 2011-05-01 DIAGNOSIS — T8389XA Other specified complication of genitourinary prosthetic devices, implants and grafts, initial encounter: Secondary | ICD-10-CM | POA: Insufficient documentation

## 2011-05-01 DIAGNOSIS — R109 Unspecified abdominal pain: Secondary | ICD-10-CM | POA: Insufficient documentation

## 2011-05-01 DIAGNOSIS — I2510 Atherosclerotic heart disease of native coronary artery without angina pectoris: Secondary | ICD-10-CM | POA: Insufficient documentation

## 2011-05-01 LAB — POCT I-STAT 4, (NA,K, GLUC, HGB,HCT)
Glucose, Bld: 99 mg/dL (ref 70–99)
HCT: 44 % (ref 36.0–46.0)
Hemoglobin: 15 g/dL (ref 12.0–15.0)
Potassium: 3.5 mEq/L (ref 3.5–5.1)
Sodium: 140 mEq/L (ref 135–145)

## 2011-07-05 ENCOUNTER — Other Ambulatory Visit: Payer: Self-pay | Admitting: Family Medicine

## 2011-07-05 ENCOUNTER — Ambulatory Visit
Admission: RE | Admit: 2011-07-05 | Discharge: 2011-07-05 | Disposition: A | Discharge status: Home / Self Care | Payer: Medicare Other | Source: Ambulatory Visit | Attending: Family Medicine | Admitting: Family Medicine

## 2011-07-05 DIAGNOSIS — R05 Cough: Secondary | ICD-10-CM

## 2011-07-05 DIAGNOSIS — R059 Cough, unspecified: Secondary | ICD-10-CM

## 2011-07-05 DIAGNOSIS — IMO0001 Reserved for inherently not codable concepts without codable children: Secondary | ICD-10-CM

## 2011-07-20 ENCOUNTER — Inpatient Hospital Stay (HOSPITAL_COMMUNITY)
Admission: EM | Admit: 2011-07-20 | Discharge: 2011-07-24 | DRG: 689 | Disposition: A | Discharge status: Home / Self Care | Payer: Medicare Other | Attending: Internal Medicine | Admitting: Internal Medicine

## 2011-07-20 ENCOUNTER — Observation Stay (HOSPITAL_COMMUNITY): Payer: Medicare Other

## 2011-07-20 DIAGNOSIS — J189 Pneumonia, unspecified organism: Secondary | ICD-10-CM | POA: Diagnosis present

## 2011-07-20 DIAGNOSIS — E872 Acidosis, unspecified: Secondary | ICD-10-CM | POA: Diagnosis present

## 2011-07-20 DIAGNOSIS — Z7982 Long term (current) use of aspirin: Secondary | ICD-10-CM

## 2011-07-20 DIAGNOSIS — E039 Hypothyroidism, unspecified: Secondary | ICD-10-CM

## 2011-07-20 DIAGNOSIS — N390 Urinary tract infection, site not specified: Principal | ICD-10-CM | POA: Diagnosis present

## 2011-07-20 DIAGNOSIS — I2510 Atherosclerotic heart disease of native coronary artery without angina pectoris: Secondary | ICD-10-CM | POA: Diagnosis present

## 2011-07-20 DIAGNOSIS — I951 Orthostatic hypotension: Secondary | ICD-10-CM | POA: Diagnosis present

## 2011-07-20 DIAGNOSIS — E785 Hyperlipidemia, unspecified: Secondary | ICD-10-CM | POA: Diagnosis present

## 2011-07-20 DIAGNOSIS — I10 Essential (primary) hypertension: Secondary | ICD-10-CM | POA: Diagnosis present

## 2011-07-20 DIAGNOSIS — Z91040 Latex allergy status: Secondary | ICD-10-CM

## 2011-07-20 DIAGNOSIS — Z880 Allergy status to penicillin: Secondary | ICD-10-CM

## 2011-07-20 DIAGNOSIS — N179 Acute kidney failure, unspecified: Secondary | ICD-10-CM | POA: Diagnosis present

## 2011-07-20 DIAGNOSIS — Z9861 Coronary angioplasty status: Secondary | ICD-10-CM

## 2011-07-20 LAB — URINALYSIS, ROUTINE W REFLEX MICROSCOPIC
Bilirubin Urine: NEGATIVE
Glucose, UA: NEGATIVE mg/dL
Hgb urine dipstick: NEGATIVE
Ketones, ur: NEGATIVE mg/dL
Nitrite: NEGATIVE
Protein, ur: NEGATIVE mg/dL
Specific Gravity, Urine: 1.018 (ref 1.005–1.030)
Urobilinogen, UA: 0.2 mg/dL (ref 0.0–1.0)
pH: 5.5 (ref 5.0–8.0)

## 2011-07-20 LAB — COMPREHENSIVE METABOLIC PANEL
ALT: 22 U/L (ref 0–35)
AST: 22 U/L (ref 0–37)
Albumin: 3.9 g/dL (ref 3.5–5.2)
Alkaline Phosphatase: 94 U/L (ref 39–117)
BUN: 14 mg/dL (ref 6–23)
CO2: 16 mEq/L — ABNORMAL LOW (ref 19–32)
Calcium: 9.2 mg/dL (ref 8.4–10.5)
Chloride: 100 mEq/L (ref 96–112)
Creatinine, Ser: 0.7 mg/dL (ref 0.50–1.10)
GFR calc Af Amer: >90 mL/min (ref >90)
GFR calc non Af Amer: 83 mL/min — ABNORMAL LOW (ref >90)
Glucose, Bld: 116 mg/dL — ABNORMAL HIGH (ref 70–99)
Potassium: 3.8 mEq/L (ref 3.5–5.1)
Sodium: 137 mEq/L (ref 135–145)
Total Bilirubin: 0.2 mg/dL — ABNORMAL LOW (ref 0.3–1.2)
Total Protein: 6.5 g/dL (ref 6.0–8.3)

## 2011-07-20 LAB — URINE MICROSCOPIC-ADD ON

## 2011-07-20 LAB — CBC
HCT: 39.5 % (ref 36.0–46.0)
Hemoglobin: 13.5 g/dL (ref 12.0–15.0)
MCH: 29.8 pg (ref 26.0–34.0)
MCHC: 34.2 g/dL (ref 30.0–36.0)
MCV: 87.2 fL (ref 78.0–100.0)
Platelets: 255 10*3/uL (ref 150–400)
RBC: 4.53 MIL/uL (ref 3.87–5.11)
RDW: 13.8 % (ref 11.5–15.5)
WBC: 9.4 10*3/uL (ref 4.0–10.5)

## 2011-07-20 LAB — LIPASE, BLOOD: Lipase: 56 U/L (ref 11–59)

## 2011-07-21 LAB — BASIC METABOLIC PANEL
BUN: 35 mg/dL — ABNORMAL HIGH (ref 6–23)
CO2: 31 mEq/L (ref 19–32)
Calcium: 9.5 mg/dL (ref 8.4–10.5)
Chloride: 99 mEq/L (ref 96–112)
Creatinine, Ser: 2.05 mg/dL — ABNORMAL HIGH (ref 0.50–1.10)
GFR calc Af Amer: 26 mL/min — ABNORMAL LOW (ref >90)
GFR calc non Af Amer: 23 mL/min — ABNORMAL LOW (ref >90)
Glucose, Bld: 100 mg/dL — ABNORMAL HIGH (ref 70–99)
Potassium: 3 mEq/L — ABNORMAL LOW (ref 3.5–5.1)
Sodium: 140 mEq/L (ref 135–145)

## 2011-07-21 LAB — URINE CULTURE
Colony Count: 75000
Culture  Setup Time: 201211030052

## 2011-07-21 LAB — CBC
HCT: 39.6 % (ref 36.0–46.0)
Hemoglobin: 13.1 g/dL (ref 12.0–15.0)
MCH: 33.2 pg (ref 26.0–34.0)
MCHC: 33.1 g/dL (ref 30.0–36.0)
MCV: 100.5 fL — ABNORMAL HIGH (ref 78.0–100.0)
Platelets: 184 10*3/uL (ref 150–400)
RBC: 3.94 MIL/uL (ref 3.87–5.11)
RDW: 14.7 % (ref 11.5–15.5)
WBC: 8.2 10*3/uL (ref 4.0–10.5)

## 2011-07-21 LAB — URINE MICROSCOPIC-ADD ON

## 2011-07-21 LAB — URINALYSIS, ROUTINE W REFLEX MICROSCOPIC
Bilirubin Urine: NEGATIVE
Glucose, UA: NEGATIVE mg/dL
Hgb urine dipstick: NEGATIVE
Ketones, ur: NEGATIVE mg/dL
Nitrite: NEGATIVE
Protein, ur: NEGATIVE mg/dL
Specific Gravity, Urine: 1.018 (ref 1.005–1.030)
Urobilinogen, UA: 0.2 mg/dL (ref 0.0–1.0)
pH: 6 (ref 5.0–8.0)

## 2011-07-21 LAB — RETICULOCYTES
RBC.: 3.94 MIL/uL (ref 3.87–5.11)
Retic Count, Absolute: 74.9 10*3/uL (ref 19.0–186.0)
Retic Ct Pct: 1.9 % (ref 0.4–3.1)

## 2011-07-21 LAB — MAGNESIUM: Magnesium: 2.2 mg/dL (ref 1.5–2.5)

## 2011-07-21 LAB — IRON: Iron: 116 ug/dL (ref 42–135)

## 2011-07-21 LAB — LACTIC ACID, PLASMA: Lactic Acid, Venous: 1.8 mmol/L (ref 0.5–2.2)

## 2011-07-21 LAB — PHOSPHORUS: Phosphorus: 2.2 mg/dL — ABNORMAL LOW (ref 2.3–4.6)

## 2011-07-21 LAB — OCCULT BLOOD X 1 CARD TO LAB, STOOL: Fecal Occult Bld: NEGATIVE

## 2011-07-21 MED ORDER — SULFAMETHOXAZOLE-TRIMETHOPRIM 400-80 MG PO TABS
1.0000 | ORAL_TABLET | Freq: Two times a day (BID) | ORAL | Status: DC
  Administered 2011-07-22 (×2): 1 via ORAL
  Filled 2011-07-21 (×6): qty 1

## 2011-07-21 MED ORDER — FENOFIBRATE 160 MG PO TABS
160.0000 mg | ORAL_TABLET | Freq: Every day | ORAL | Status: DC
  Administered 2011-07-22 – 2011-07-24 (×3): 160 mg via ORAL
  Filled 2011-07-21 (×4): qty 1

## 2011-07-21 MED ORDER — SODIUM CHLORIDE 0.9 % IV SOLN: INTRAVENOUS | Status: DC

## 2011-07-21 MED ORDER — LEVOTHYROXINE SODIUM 150 MCG PO TABS
150.0000 ug | ORAL_TABLET | Freq: Every day | ORAL | Status: DC
  Administered 2011-07-22 – 2011-07-24 (×3): 150 ug via ORAL
  Filled 2011-07-21 (×5): qty 1

## 2011-07-21 MED ORDER — GENTAMICIN IN SALINE 1.6-0.9 MG/ML-% IV SOLN
80.0000 mg | Freq: Three times a day (TID) | INTRAVENOUS | Status: DC
  Filled 2011-07-21 (×5): qty 50

## 2011-07-21 MED ORDER — ONDANSETRON HCL 4 MG PO TABS: 4.0000 mg | ORAL_TABLET | Freq: Four times a day (QID) | ORAL | Status: DC | PRN

## 2011-07-21 MED ORDER — ASPIRIN EC 81 MG PO TBEC
81.0000 mg | DELAYED_RELEASE_TABLET | Freq: Every day | ORAL | Status: DC
  Administered 2011-07-22 – 2011-07-24 (×3): 81 mg via ORAL
  Filled 2011-07-21 (×4): qty 1

## 2011-07-21 MED ORDER — PRAVASTATIN SODIUM 20 MG PO TABS
20.0000 mg | ORAL_TABLET | Freq: Every day | ORAL | Status: DC
  Administered 2011-07-22 – 2011-07-23 (×2): 20 mg via ORAL
  Filled 2011-07-21: qty 1
  Filled 2011-07-21 (×2): qty 0.5
  Filled 2011-07-21: qty 1
  Filled 2011-07-21: qty 0.5

## 2011-07-21 MED ORDER — ESTRADIOL 0.1 MG/GM VA CREA: 2.0000 g | TOPICAL_CREAM | Freq: Every day | VAGINAL | Status: DC | PRN

## 2011-07-21 MED ORDER — ACETAMINOPHEN 325 MG PO TABS: 650.0000 mg | ORAL_TABLET | ORAL | Status: DC | PRN

## 2011-07-21 MED ORDER — ONDANSETRON HCL 4 MG/2ML IJ SOLN: 4.0000 mg | Freq: Four times a day (QID) | INTRAMUSCULAR | Status: DC | PRN

## 2011-07-21 MED ORDER — PRAVASTATIN SODIUM 40 MG PO TABS
20.0000 mg | ORAL_TABLET | Freq: Every day | ORAL | Status: DC
  Filled 2011-07-21: qty 0.5

## 2011-07-21 MED ORDER — HEPARIN SODIUM (PORCINE) 5000 UNIT/ML IJ SOLN
5000.0000 [IU] | Freq: Three times a day (TID) | INTRAMUSCULAR | Status: DC
  Administered 2011-07-22 – 2011-07-24 (×6): 5000 [IU] via SUBCUTANEOUS
  Filled 2011-07-21 (×10): qty 1

## 2011-07-21 NOTE — H&P (Signed)
Jill Preston, AMARAL NO.:  [id number]  MEDICAL RECORD NO.:  [id number]  LOCATION:  3013                         FACILITY:  [facility]  PHYSICIAN:  Carlota Raspberry, MD         DATE OF BIRTH:  1933/02/15  DATE OF ADMISSION:  07/20/2011 DATE OF DISCHARGE:                             HISTORY & PHYSICAL   CHIEF COMPLAINT:  Orthostatic symptoms dizziness, not feeling well.  HISTORY OF PRESENT ILLNESS:  A 77 year old female with a history of PTCA and stenting of mid LAD in 2000, normal cath in July 2009, bladder prolapse and incontinence status post numerous surgical procedures presents with orthostasis.  The patient is able to relate her history and states that about 2-3 weeks ago she had an episode of bronchitis for which she was given a Z- Pak x2.  She also presents with some typed notes from her PCP visit today which confirms that she has suspected bronchitis but they state she also was given prednisone, but the patient denies prednisone.  At that time, she had chills but no objective fever and very severe cough for which she was given Hycodan cough syrup.  These symptoms have apparently resolved over the past couple of weeks,  however, the more acute issue at present is that for the past week she has felt decreased energy, sluggishness and lethargy.  It has been progressively worsening over the past week. Over the past 2 days, she has felt orthostatics symptoms such that when she gets out of bed  she feels very dizzy and will have to hold on to the wall while walking to the bathroom and has felt very presyncopal but does not actually lost any consciousness.  She is not currently having any fevers.  She does maintain that she is constantly drinking water and has maintained oral liquids but does endorse anorexia and decreased appetite and has not been eating as much over the past couple of days.  She endorses "craving salt."  Otherwise, she has been having nausea when  she eats the little amount of food that she has been able to get in but she feels that her cough is improved and denies any chest pain, palpitations, shortness of breath, vomiting, abdominal pain.  She denies any orthopnea and states that her lower extremities are always swollen (however by my exam are not really that swollen).  She does endorse some dysuria but this appears to be a chronic issue for her that improves with estrogen cream and is more related to her long history of urethral and bladder issues for which she has had 7 surgeries over the past 3 years.  Since all of these bladder issues, she does state that she has had more UTIs in the recent past. She is having dysuria over the last few days.  She presents to her primary care doctor on July 20, 2011, with the above complaints and was noted to have a blood pressure of 80/60 with temp 97.9 and a pulse of 70 while sitting.  Her blood pressure apparently rose to 130/80 with a pulse of 58 while standing, however, the paper also states that she later had a blood pressure of 90/60  with a pulse of 68.  Her PCP sent her to the emergency room for orthostatic hypotension.  She also mentioned an issue with elevated creatinine, see below in the impression.  In the emergency room, initial vital signs were temp 97.9, blood pressure 164/60, pulse 57, respirations 16, and pulse ox 95%.  Her workup in the ED showed a fairly normal CBC other than a slightly dropped hematocrit at 39.  Chemistry normal except her bicarb was 16. Her renal function was 17 and 0.7.  She had a UA which showed large leuk esterase, but negative nitrites, and 21-50 wbc's and a few bacteria; however, there were a lot of squamous cells.  She was initially put in the CDU and started treatment with IV gentamicin given that she has an extensive history of allergies with hives from Cipro and shortness of breath from penicillin.  Therefore, ED staff spoke with the pharmacy  and the most reasonable antibiotic they came up with was IV gentamicin which she was given 120 mg.  She was also given 2 L of normal saline.  Currently, the patient is feeling better and is resting comfortably. She relates the above review of systems and is otherwise negative.  PAST MEDICAL HISTORY: 1. CAD status post PTCA and stenting of the proximal to mid LAD in     December 2000, repeat cath July 2009 was normal. 2. Refractory urge incontinence status post 7 surgeries since 2009,     and status post implantation and explantation of an InterStim. 3. Upper airway issue unclear,?  Due to GERD versus ACE inhibitor in     2008. 4. Hypertension. 5. Remote cholecystectomy. 6. Neck surgery. 7. Hysterectomy. 8. Primary hypothyroidism. 9. Right rotator cuff status post arthroscopy in 2005.  MEDICATIONS:  Medication list is somewhat reconciled with the patient and is done between a handwritten list and also medication list from the primary care providers note and include: 1. B12 1000 mcg daily. 2. Losartan/hydrochlorothiazide 100/25 daily. 3. Levothyroxine 150 mcg daily. 4. Fenofibrate 160 mg daily. 5. Bumex 2 mg daily. 6. Aspirin 81 daily. 7. Estrace cream. 8. Naproxen 250-500 mg q.8 p.r.n. 9. Cranberry juice. 10.Azithromycin 250 mg for the recent bronchitis. 11.Hycodan syrup for the bronchitis. 12.Pravachol 20 mg daily. 13.Prednisone 10 mg dose pack which the patient states she had     actually never took. 14.Pro-Air inhalers.  ALLERGIES:  Listed are to LATEX which causes hives and skin rash, CIPRO which causes hives and skin rash, PENICILLIN which causes trouble breathing, and QUINAPRIL which caused a cough.  FAMILY HISTORY:  Father had a CVA and deceased at 47, mother had breast cancer and deceased at 11.  Father and mother had hypertension.  SOCIAL HISTORY:  She is still living at home.  She has never smoked.  PHYSICAL EXAMINATION:  VITAL SIGNS:  Temperature 98.0, pulse  57, respirations 20, blood pressure 129/68, 95% on room air. GENERAL:  She is a large, somewhat obese lady in the hospital bed.  She is pleasant, conversant, able to relate her history well.  She does not appear ill or toxic. HEENT:  Her pupils are equal, round, and reactive to light.  Her extraocular muscles are intact.  Her sclerae are clear.  Her mouth is somewhat dry but not overtly so.  Her lips are not extremely dry appearing, otherwise her oropharynx is clear. NECK:  Supple.  There is no cervical lymphadenopathy or thyromegaly. LUNGS:  Bilateral and inspiratory crackles at the bases only, otherwise are quite  clear. HEART:  Regular rate and rhythm with no murmurs or gallops or other adventitious heart sounds. ABDOMEN:  A bit obese but is soft, nontender, nondistended and quite benign. EXTREMITIES:  Warm, well perfused with no cyanosis or clubbing.  There is no bilateral lower extremity edema by my exam. NEURO:  Grossly nonfocal.  She is alert, conversant, pleasant, moving her extremities, able to sit up in the ED bed. SKIN:  Nondiaphoretic.  It is a bit dry appearing and there is some mild skin tenting on her hand.  LABORATORY WORK:  White blood cell count 9.4, hematocrit 39.5, platelets of 255.  Chemistry panel was completely normal except her bicarb was 16. Her glucose is 116. Her renal function is 17 and 0.7.  LFTs are normal. Calcium is 9.2, lipase is 56.  Her UA shows large leuk esterase and negative nitrites, many squamous cells, 21-50 wbc's and a few bacteria.  Chest x-ray shows cardiomegaly but no active disease.  Of note, her chest x-ray on June 25, 2011, was called as a normal heart size.  IMPRESSION:  This is a 77 year old female with a history of coronary artery disease status post percutaneous transluminal coronary angioplasty/stenting of the left anterior descending  in December 2000, with repeat catheterization in 2009 normal, refractory urge  incontinence and bladder and urethral issues status post numerous procedures, hypothyroidism who presents with orthostatic hypotension at her PCP's office. 1. Orthostasis, lethargy.  I suspect that she had a bronchitis a     couple of weeks ago and got behind in her p.o. intake and had     continued to take hydrochlorothiazide and bumetanide and maybe got     a bit dehydrated.  This would cause her orthostatics seen at the     PCP.  Her PCP does mention some blood loss anemia in her assessment     and her hematocrit is down at 39.5 from a baseline of 44 earlier     this year.  I doubt this is causing her acute symptoms as  this     hematocrit is still quite robust but it is a bit different.     Finally, it should be mentioned that her chest x-ray shows     cardiomegaly, where as one done a couple of weeks ago was called as     a normal heart size.  I reviewed these chest x-rays myself and I am     not that impressed that she is overtly in congestive heart failure.     Therefore, we would treat her for orthostatic hypotension at     present, and if she continues to do poorly, just consider an echo     but I am not overwhelmed to do this at this point.  Therefore, overnight, we will hold her Bumex and her hydrochlorothiazide and hold her blood pressure medicines as well.  We will get orthostatic on admission and continue them daily until they are seen to be negative and continue to give her some continuous fluids.  Per her PCP's suggestion, we will get TSH, free T4-T3 although I doubt this is the cause of her presentation.  We will Hemoccult her stools and get iron studies and reticulocyte count to evaluate anemia.  1. Questionable metabolic acidosis: Her bicarb is 16, but I do not     think that she is as ill as to suggest a florid acidosis, and I am     questioning whether this is a lab abnormality  although I cannot be     sure.  Therefore, like to repeat a BMET as soon as possible and  get     a venous lactate at the same time.  She does not appear to have     hyperglycemia to suggest a DKA and her renal function is also     normal.  If it continues to be abnormal, she will need an ABG to     further evaluate.  1. Urinary tract infection.  She does showed signs of infection on her     UA and has a complicated history of bladder and urethral     procedures.  However, her UA shows a lot of squamous cells, and so     I would like to repeat it and also get a urine culture.  Treat with     gentamicin at this point is fair until she rules in or out for     urinary tract infection.  Gentamicin was decided upon because of a     complicated allergy history.  1. ? AKI.  Per her prior PCP note, her creatinine is ranged 1.55-1.63     over the past couple of months, and her creatinine was 2.36 on     July 05, 2011, per PCP notes.  However, it appears completely     normal now so this is unclear to me.  For now, I would just     continue to trend this.  1. Coronary artery disease.  We will continue her home aspirin 81, but     we will hold home losartan/hydrochlorothiazide and continue home     statin.  1. Hypothyroidism.  We will continue her home thyroid medication and     get thyroid studies.  Her PCP notes that her TSH was 0.115 on     May 08, 2011.  1. Fluids, electrolytes and nutrition.  We will give her continuous IV     fluids overnight and she can get her heart healthy diet.  1. IV access.  She has 1 peripheral IV in her left arm.  1. Prophylaxis subcutaneous heparin unless she is ambulatory t.i.d.,     Zofran, Tylenol.  CODE STATUS:  She will be full code.  I have discussed this with her.  The patient will be admitted to a regular bed under [facility].          ______________________________ Carlota Raspberry, MD     EB/MEDQ  D:  07/21/2011  T:  07/21/2011  Job:  [id number]  Electronically Signed by Carlota Raspberry MD on 07/21/2011 04:19:17 AM

## 2011-07-21 NOTE — Progress Notes (Signed)
  Jill Preston, Jill Preston NO.:  [id number]  MEDICAL RECORD NO.:  [id number]  LOCATION:  3013                         FACILITY:  [facility]  PHYSICIAN:  Brendia Sacks, MD    DATE OF BIRTH:  02/23/1933                                PROGRESS NOTE   BRIEF NARRATIVE: This is a 77 year old woman who presented to her primary care physician's office  in July 20, 2011 with complaints of dizziness for a week.  She had recently been treated for bronchitis with Zithromax, prednisone and cough medication.  Primary care physician is Andi Devon.  She has noted to have gait instability, generalized weakness and malaise.  Blood pressure was noted to be 80/60 with a heart rate of 70.  EMS was called for orthostasis.  Per EMS documentation on arrival, blood pressure is 122/86, and heart rate of 66 with no evidence of orthostasis.  Review of Dr. Renae Gloss notes and include laboratory studies are notable for creatinine of 1.63 May 08, 2011 and a creatinine of 2.36 with a BUN of 40, July 05, 2011.  Outpatient medications include bumetanide and Hyzaar.  PAST MEDICAL HISTORY: Includes hypertension, hypothyroidism, coronary artery disease with stent placement, bladder prolapse with incontinence.  ASSESSMENT/PLAN: 1. Orthostatic hypotension likely secondary to diuretics and     dehydration.  Creatinine yesterday was 0.7,  today is 2.05 which     has been confirmed.  I suspect yesterday's value was spurious.  She     was also noted to have a low CO2 at that time.  I suspect basic     metabolic panel today reflects her true status at this point.     Actually her creatinine is improved and her BUN is improved when     compared to July 05, 2011 laboratory studies.  She does not have     any history of heart failure.  She has been on Bumex and Hyzaar for     some time for lower extremity edema.  These are on hold and we will     continue IV fluids at this time. 2. Urinary  tract infection.  Culture is pending.  The patient has no     allergy to sulfa.  She has no allergies to sulfa.  She cannot recall      what previous antibiotic she has taken     for urinary tract infections.  She is apparently allergic to CIPRO     as well as PENICILLIN. Will discontinue gentamicin and start Bactrim per     pharmacy. 3. Hypothyroidism.  Continue her Synthroid and follow up on TSH. 4. Hypertension appears to be stable. 5. Chronic lower extremity edema.  She does not have any significant     edema on exam today.     Brendia Sacks, MD     DG/MEDQ  D:  07/21/2011  T:  07/21/2011  Job:  [id number]  Electronically Signed by Brendia Sacks  on 07/21/2011 05:22:02 PM

## 2011-07-22 LAB — BASIC METABOLIC PANEL
BUN: 21 mg/dL (ref 6–23)
CO2: 25 mEq/L (ref 19–32)
Calcium: 8.8 mg/dL (ref 8.4–10.5)
Chloride: 102 mEq/L (ref 96–112)
Creatinine, Ser: 1.59 mg/dL — ABNORMAL HIGH (ref 0.50–1.10)
GFR calc Af Amer: 36 mL/min — ABNORMAL LOW (ref >90)
GFR calc non Af Amer: 31 mL/min — ABNORMAL LOW (ref >90)
Glucose, Bld: 100 mg/dL — ABNORMAL HIGH (ref 70–99)
Potassium: 3 mEq/L — ABNORMAL LOW (ref 3.5–5.1)
Sodium: 137 mEq/L (ref 135–145)

## 2011-07-22 LAB — PHOSPHORUS: Phosphorus: 1.5 mg/dL — ABNORMAL LOW (ref 2.3–4.6)

## 2011-07-22 LAB — LACTIC ACID, PLASMA: Lactic Acid, Venous: 1.9 mmol/L (ref 0.5–2.2)

## 2011-07-22 LAB — CBC
HCT: 38.7 % (ref 36.0–46.0)
Hemoglobin: 12.8 g/dL (ref 12.0–15.0)
MCH: 33.3 pg (ref 26.0–34.0)
MCHC: 33.1 g/dL (ref 30.0–36.0)
MCV: 100.8 fL — ABNORMAL HIGH (ref 78.0–100.0)
Platelets: 181 10*3/uL (ref 150–400)
RBC: 3.84 MIL/uL — ABNORMAL LOW (ref 3.87–5.11)
RDW: 14.8 % (ref 11.5–15.5)
WBC: 6.2 10*3/uL (ref 4.0–10.5)

## 2011-07-22 LAB — MAGNESIUM: Magnesium: 2.2 mg/dL (ref 1.5–2.5)

## 2011-07-22 LAB — T3: T3, Total: <10 ng/dl — ABNORMAL LOW (ref 80.0–204.0)

## 2011-07-22 LAB — URINE CULTURE
Colony Count: 10000
Culture  Setup Time: 201211031204
Special Requests: NEGATIVE

## 2011-07-22 LAB — FERRITIN: Ferritin: 246 ng/mL (ref 10–291)

## 2011-07-22 LAB — T4, FREE: Free T4: 0.12 ng/dL — ABNORMAL LOW (ref 0.80–1.80)

## 2011-07-22 LAB — TSH: TSH: 157.371 u[IU]/mL — ABNORMAL HIGH (ref 0.350–4.500)

## 2011-07-22 MED ORDER — PANTOPRAZOLE SODIUM 40 MG PO TBEC
40.0000 mg | DELAYED_RELEASE_TABLET | Freq: Every day | ORAL | Status: DC
  Administered 2011-07-22 – 2011-07-24 (×3): 40 mg via ORAL
  Filled 2011-07-22 (×2): qty 1

## 2011-07-22 MED ORDER — POTASSIUM CHLORIDE 20 MEQ/15ML (10%) PO LIQD
40.0000 meq | Freq: Once | ORAL | Status: AC
  Administered 2011-07-22: 40 meq via ORAL
  Filled 2011-07-22: qty 30

## 2011-07-22 MED ORDER — POTASSIUM PHOSPHATE MONOBASIC 500 MG PO TABS
500.0000 mg | ORAL_TABLET | Freq: Three times a day (TID) | ORAL | Status: AC
  Administered 2011-07-22 – 2011-07-23 (×3): 500 mg via ORAL
  Filled 2011-07-22 (×4): qty 1

## 2011-07-22 MED ORDER — POTASSIUM CHLORIDE IN NACL 20-0.45 MEQ/L-% IV SOLN
INTRAVENOUS | Status: DC
  Administered 2011-07-22: 15:00:00 via INTRAVENOUS
  Filled 2011-07-22 (×2): qty 1000

## 2011-07-22 NOTE — Progress Notes (Signed)
Subjective: Reviewed chart. Saw and examined patient at bedside. Jill Preston was admitted with dizziness and found to be orthostatic, has UTI, hypothyroid with TSH 157, in ARF. It seems like she takes the synthroid inconsistently. She complaints of cough and leg swelling. She feels drained out.  Objective: Vital signs in last 24 hours: Temp:  [97.6 F (36.4 C)-98 F (36.7 C)] 97.6 F (36.4 C) (11/04 0933) Pulse Rate:  [55] 55  (11/04 0933) Resp:  [20] 20  (11/04 0933) BP: (134-137)/(69-73) 134/69 mmHg (11/04 0933) SpO2:  [93 %-96 %] 96 % (11/04 0933) Weight:  [104.3 kg (229 lb 15 oz)] 229 lb 15 oz (104.3 kg) (11/03 1700) Weight change:     Intake/Output from previous day: 11/03 0701 - 11/04 0700 In: -  Out: 250 [Urine:250] Intake/Output this shift: Total I/O In: 1440 [P.O.:1440] Out: 901 [Urine:900; Stool:1]  General appearance: alert, cooperative, fatigued, mild distress, morbidly obese and slowed mentation Neck: no adenopathy, no carotid bruit, no JVD, supple, symmetrical, trachea midline and thyroid not enlarged, symmetric, no tenderness/mass/nodules Resp: diminished breath sounds bilaterally and rhonchi RLL Cardio: regular rate and rhythm, S1, S2 normal, no murmur, click, rub or gallop GI: soft, non-tender; bowel sounds normal; no masses,  no organomegaly Extremities: some tinge of edema and tenderness to palpation. Cold. Neurologic: Alert and oriented X 3, normal strength and tone. Normal symmetric reflexes. Normal coordination and gait  Lab Results:  Basename 07/22/11 0730 07/21/11 0730  WBC 6.2 8.2  HGB 12.8 13.1  HCT 38.7 39.6  PLT 181 184   BMET  Basename 07/22/11 0730 07/21/11 1023  NA 137 140  K 3.0* 3.0*  CL 102 99  CO2 25 31  GLUCOSE 100* 100*  BUN 21 35*  CREATININE 1.59* 2.05*  CALCIUM 8.8 9.5    Studies/Results: Dg Chest 2 View  07/20/2011  *RADIOLOGY REPORT*  Clinical Data: Weakness, cough, shortness of breath.  CHEST - 2 VIEW  Comparison:  07/05/2011  Findings: Mild cardiomegaly.  No focal opacities, effusions or edema.  No acute bony abnormality.  IMPRESSION: Cardiomegaly.  No active disease.  Original Report Authenticated By: Cyndie Chime, M.D.    Medications: medications reviewed.  Assessment/Plan: 1. AKI, orthostatic hypotension- improving. Wonder how much hypothyroidism contributed. Continue gentle fluids. 2. UTI- multiple bacterial polymorphs on culture. On bactrim, not ideal given aki. Will switch to ceftriaxone/zithromax. 3. Hypothyroidism- not sure if qtn of non adherence or something else going on. Continue synthroid as current. 4. Morbid obesity. 5. Possible PNA- will switch to ceftriaxone/zithromax. 6. DVT/GI prophylaxis.  LOS: 2 days   Donell Sliwinski 07/22/2011, 2:21 PM

## 2011-07-23 ENCOUNTER — Inpatient Hospital Stay (HOSPITAL_COMMUNITY): Payer: Medicare Other

## 2011-07-23 DIAGNOSIS — N179 Acute kidney failure, unspecified: Secondary | ICD-10-CM | POA: Diagnosis present

## 2011-07-23 DIAGNOSIS — I10 Essential (primary) hypertension: Secondary | ICD-10-CM | POA: Diagnosis present

## 2011-07-23 DIAGNOSIS — E039 Hypothyroidism, unspecified: Secondary | ICD-10-CM | POA: Diagnosis present

## 2011-07-23 LAB — COMPREHENSIVE METABOLIC PANEL
ALT: 18 U/L (ref 0–35)
AST: 34 U/L (ref 0–37)
Albumin: 3 g/dL — ABNORMAL LOW (ref 3.5–5.2)
Alkaline Phosphatase: 25 U/L — ABNORMAL LOW (ref 39–117)
BUN: 13 mg/dL (ref 6–23)
CO2: 26 mEq/L (ref 19–32)
Calcium: 8.8 mg/dL (ref 8.4–10.5)
Chloride: 106 mEq/L (ref 96–112)
Creatinine, Ser: 1.62 mg/dL — ABNORMAL HIGH (ref 0.50–1.10)
GFR calc Af Amer: 35 mL/min — ABNORMAL LOW (ref >90)
GFR calc non Af Amer: 30 mL/min — ABNORMAL LOW (ref >90)
Glucose, Bld: 97 mg/dL (ref 70–99)
Potassium: 3.7 mEq/L (ref 3.5–5.1)
Sodium: 140 mEq/L (ref 135–145)
Total Bilirubin: 0.6 mg/dL (ref 0.3–1.2)
Total Protein: 5.6 g/dL — ABNORMAL LOW (ref 6.0–8.3)

## 2011-07-23 LAB — PHOSPHORUS: Phosphorus: 2.3 mg/dL (ref 2.3–4.6)

## 2011-07-23 LAB — CBC
HCT: 36.2 % (ref 36.0–46.0)
Hemoglobin: 11.9 g/dL — ABNORMAL LOW (ref 12.0–15.0)
MCH: 33.1 pg (ref 26.0–34.0)
MCHC: 32.9 g/dL (ref 30.0–36.0)
MCV: 100.8 fL — ABNORMAL HIGH (ref 78.0–100.0)
Platelets: 166 10*3/uL (ref 150–400)
RBC: 3.59 MIL/uL — ABNORMAL LOW (ref 3.87–5.11)
RDW: 14.7 % (ref 11.5–15.5)
WBC: 5.7 10*3/uL (ref 4.0–10.5)

## 2011-07-23 LAB — SODIUM, URINE, RANDOM: Sodium, Ur: 92 mEq/L

## 2011-07-23 LAB — CREATININE, URINE, RANDOM: Creatinine, Urine: 60.52 mg/dL

## 2011-07-23 LAB — LACTIC ACID, PLASMA: Lactic Acid, Venous: 1.5 mmol/L (ref 0.5–2.2)

## 2011-07-23 LAB — MAGNESIUM: Magnesium: 2.1 mg/dL (ref 1.5–2.5)

## 2011-07-23 MED ORDER — AZITHROMYCIN 250 MG PO TABS
500.0000 mg | ORAL_TABLET | Freq: Every day | ORAL | Status: AC
  Filled 2011-07-23: qty 1

## 2011-07-23 MED ORDER — AZITHROMYCIN 250 MG PO TABS
250.0000 mg | ORAL_TABLET | Freq: Every day | ORAL | Status: DC
  Administered 2011-07-24: 250 mg via ORAL
  Filled 2011-07-23: qty 1

## 2011-07-23 MED ORDER — SULFAMETHOXAZOLE-TRIMETHOPRIM 400-80 MG PO TABS
1.0000 | ORAL_TABLET | Freq: Every day | ORAL | Status: DC
  Administered 2011-07-23 – 2011-07-24 (×2): 1 via ORAL
  Filled 2011-07-23 (×2): qty 1

## 2011-07-23 NOTE — Progress Notes (Signed)
Subjective: Feels better. More energy. Problems breathing at night. Husband says patient occasionally snores.  Objective: Vital signs in last 24 hours: Temp:  [98.2 F (36.8 C)-98.4 F (36.9 C)] 98.2 F (36.8 C) (11/05 0603) Pulse Rate:  [54-59] 54  (11/05 0603) Resp:  [19-20] 20  (11/05 0603) BP: (130-145)/(55-72) 130/72 mmHg (11/05 0603) SpO2:  [91 %-94 %] 94 % (11/05 0603) Weight change:  Last BM Date: 07/21/11  Intake/Output from previous day: 11/04 0701 - 11/05 0700 In: 2490 [P.O.:2160; I.V.:330] Out: 2301 [Urine:2300; Stool:1] Intake/Output this shift: Total I/O In: -  Out: 700 [Urine:700]  HEENT- no jvd. RS- Lungs clear. CVS- S1,S2.RRR. No murmurs. Abdomen-benign. CNS-grossly intact. Extremities- pedal edema resolved.  Lab Results:  Basename 07/23/11 0620 07/22/11 0730  WBC 5.7 6.2  HGB 11.9* 12.8  HCT 36.2 38.7  PLT 166 181   BMET  Basename 07/23/11 0620 07/22/11 0730  NA 140 137  K 3.7 3.0*  CL 106 102  CO2 26 25  GLUCOSE 97 100*  BUN 13 21  CREATININE 1.62* 1.59*  CALCIUM 8.8 8.8      Medications: medications reviewed.  Assessment/Plan:  1. AKI- no significant change. Check urine electrolytes. D/C IVF. Change bactrim to once daily dosing. 2. UTI- On bactrim,not given ceftriaxone due to pcn allergy.  3. Hypothyroidism- better with oral synthroid. Compliance seems an issue. Would continue synthroid for a couple of weeks and reevaluate for ?central problems. Meanwhile, sent a screening random cortisol/prolactin. May benefit form Endocrinology f/u outpatient.  4. Morbid obesity/Possible OSA- will need sleep study/PFTs once back to baseline. 5. Possible PNA- clinically better. Add Zithromax to bactrim for atypicals. 6. DVT/GI prophylaxis.   LOS: 3 days   Lillian Tigges 07/23/2011, 10:11 AM

## 2011-07-23 NOTE — Progress Notes (Signed)
Physical Therapy Treatment Patient Details Name: Jill Preston MRN: [id number] DOB: 28-Mar-1933 Today's Date: 07/23/2011  PT Assessment/Plan  PT - Assessment/Plan Comments on Treatment Session: Pt progressing well with PT goals.  States that she continues to feel slightly weak but is getting back to baseline.   PT Plan: Discharge plan remains appropriate Follow Up Recommendations: Home health PT Equipment Recommended: None recommended by PT PT Goals     PT Treatment Precautions/Restrictions  Restrictions Weight Bearing Restrictions: No Mobility (including Balance) Bed Mobility Bed Mobility: No Transfers Transfers: Yes Sit to Stand: 5: Supervision Sit to Stand Details (indicate cue type and reason): supervision for safety due to weakness Stand to Sit: 5: Supervision Stand to Sit Details: supervision for safety due to weakness Ambulation/Gait Ambulation/Gait: Yes Ambulation/Gait Assistance: 5: Supervision Ambulation/Gait Assistance Details (indicate cue type and reason): supervision for safety due to weakness.  Pt with mild unsteadiness/mild swaying with various challenges such as horizontal/vertical head turns, directional changes, velocity changes Ambulation Distance (Feet): 300 Feet Assistive device: None Stairs: No Wheelchair Mobility Wheelchair Mobility: No  Posture/Postural Control Posture/Postural Control: No significant limitations Balance Balance Assessed: No Exercise  General Exercises - Lower Extremity Long Arc Quad: AROM;15 reps;Both;Seated Hip Flexion/Marching: AROM;Both;Other reps (comment);Seated (15 reps) Heel Raises: 15 reps;AROM;Seated End of Session PT - End of Session Equipment Utilized During Treatment: Gait belt Activity Tolerance: Patient tolerated treatment well Patient left: in chair General Behavior During Session: [facility] for tasks performed Cognition: [facility] for tasks performed  Lara Mulch 07/23/2011, 3:59 PM

## 2011-07-24 LAB — BASIC METABOLIC PANEL
BUN: 11 mg/dL (ref 6–23)
CO2: 28 mEq/L (ref 19–32)
Calcium: 9.7 mg/dL (ref 8.4–10.5)
Chloride: 104 mEq/L (ref 96–112)
Creatinine, Ser: 1.77 mg/dL — ABNORMAL HIGH (ref 0.50–1.10)
GFR calc Af Amer: 31 mL/min — ABNORMAL LOW (ref >90)
GFR calc non Af Amer: 27 mL/min — ABNORMAL LOW (ref >90)
Glucose, Bld: 97 mg/dL (ref 70–99)
Potassium: 3.8 mEq/L (ref 3.5–5.1)
Sodium: 141 mEq/L (ref 135–145)

## 2011-07-24 LAB — TSH: TSH: 164.451 u[IU]/mL — ABNORMAL HIGH (ref 0.350–4.500)

## 2011-07-24 LAB — HEPATIC FUNCTION PANEL
ALT: 20 U/L (ref 0–35)
AST: 37 U/L (ref 0–37)
Albumin: 3.5 g/dL (ref 3.5–5.2)
Alkaline Phosphatase: 29 U/L — ABNORMAL LOW (ref 39–117)
Bilirubin, Direct: 0.1 mg/dL (ref 0.0–0.3)
Indirect Bilirubin: 0.4 mg/dL (ref 0.3–0.9)
Total Bilirubin: 0.5 mg/dL (ref 0.3–1.2)
Total Protein: 6.5 g/dL (ref 6.0–8.3)

## 2011-07-24 LAB — MAGNESIUM: Magnesium: 2.3 mg/dL (ref 1.5–2.5)

## 2011-07-24 LAB — TRANSFERRIN: Transferrin: 323 mg/dL (ref 200–360)

## 2011-07-24 LAB — PHOSPHORUS: Phosphorus: 2.2 mg/dL — ABNORMAL LOW (ref 2.3–4.6)

## 2011-07-24 LAB — CK: Total CK: 358 U/L — ABNORMAL HIGH (ref 7–177)

## 2011-07-24 MED ORDER — AZITHROMYCIN 250 MG PO TABS: 250.0000 mg | ORAL_TABLET | Freq: Every day | ORAL | Status: DC

## 2011-07-24 MED ORDER — PANTOPRAZOLE SODIUM 40 MG PO TBEC: 40.0000 mg | DELAYED_RELEASE_TABLET | Freq: Every day | ORAL | Status: DC

## 2011-07-24 NOTE — Progress Notes (Signed)
Physical Therapy Treatment Patient Details Name: KIYOMI PALLO MRN: [id number] DOB: 08-11-1933 Today's Date: 07/24/2011  PT Assessment/Plan  PT - Assessment/Plan PT Plan: Discharge plan remains appropriate Follow Up Recommendations: Home health PT PT Goals  Acute Rehab PT Goals PT Transfer Goal: Sit to Stand/Stand to Sit - Progress: Met PT Goal: Ambulate - Progress: Progressing toward goal PT Goal: Up/Down Stairs - Progress: Met  PT Treatment Precautions/Restrictions  Restrictions Weight Bearing Restrictions: No Mobility (including Balance) Bed Mobility Bed Mobility: No Transfers Transfers: Yes Sit to Stand: 6: Modified independent (Device/Increase time);With upper extremity assist Stand to Sit: 6: Modified independent (Device/Increase time);With upper extremity assist Ambulation/Gait Ambulation/Gait: Yes Ambulation/Gait Assistance: 5: Supervision Ambulation/Gait Assistance Details (indicate cue type and reason): supervision for safety due to pt with mild staggering with dynamic movements but did not require any physical assistance; pt with SOB but 02 sats WNL--> cues for pt to perform pursed lip breathing Ambulation Distance (Feet): 400 Feet Assistive device: None Gait Pattern: Within Functional Limits Stairs: Yes Stairs Assistance: 6: Modified independent (Device/Increase time) Stair Management Technique: One rail Right Number of Stairs: 4  Wheelchair Mobility Wheelchair Mobility: No  Balance Balance Assessed: No Dynamic Gait Index Level Surface: Normal Change in Gait Speed: Mild Impairment Gait with Horizontal Head Turns: Normal Gait with Vertical Head Turns: Normal Gait and Pivot Turn: Mild Impairment Step Over Obstacle: Normal Step Around Obstacles: Normal Steps: Mild Impairment Total Score: 21  Exercise    End of Session PT - End of Session Equipment Utilized During Treatment: Gait belt Activity Tolerance:  (SOB noted but O2 sats WNL (95%)) Patient  left: in chair;with call bell in reach;with family/visitor present General Behavior During Session: [facility] for tasks performed Cognition: [facility] for tasks performed  Lara Mulch 07/24/2011, 9:49 AM

## 2011-07-24 NOTE — Discharge Instructions (Signed)
Follow with your PCP in week to have your renal function checked.

## 2011-07-24 NOTE — Discharge Summary (Addendum)
Physician Discharge Summary  Patient ID: EARNIE ROCKHOLD MRN: [id number] DOB/AGE: 1932/10/10 77 y.o.  Admit date: 07/20/2011 Discharge date: 07/24/2011  Admission Diagnoses: Orthostatic hypotension.  Discharge Diagnoses:  Active Problems:  Myxedema  ARF (acute renal failure)  Morbid obesity  Hypothyroidism  HTN (hypertension)   Discharged Condition: fair  Hospital Course: A 77 year old female with a history of PTCA  and stenting of mid LAD in 2000, normal cath in July 2009, bladder  prolapse and incontinence status post numerous surgical procedures  presents with orthostasis.  Patient found to have UTI, multiple polymorphs, possible pna. Has worsening renal failure ? Combination of factors- antibiotics/myxedema/arb/diuretics/uti. TSH 157? Compliance issues versus central causes. Cortisol/prolactin/fsh/LH pending at time of d/c. Patient eager to go home. She will follow with her PCP in 1 week. She will call for appointment. She received a few days of bactrim, and she will d/c on zithromax- PCN allergy. Condition improved.    Consults: none  Significant Diagnostic Studies: radiology: CXR:    Discharge Exam: Blood pressure 162/96, pulse 62, temperature 98.3 F (36.8 C), temperature source Oral, resp. rate 18, height 5\' 6"  (1.676 m), weight 104.3 kg (229 lb 15 oz), SpO2 95.00%. Resp: clear to auscultation bilaterally Extremities: no edema, redness or tenderness in the calves or thighs  Disposition: Home or Self Care  Discharge Orders    Future Orders Please Complete By Expires   Diet - low sodium heart healthy      Increase activity slowly        Current Discharge Medication List    START taking these medications   Details  azithromycin (ZITHROMAX) 250 MG tablet Take 1 tablet (250 mg total) by mouth daily. Qty: 4 each, Refills: 0    pantoprazole (PROTONIX) 40 MG tablet Take 1 tablet (40 mg total) by mouth daily at 12 noon. Qty: 15 tablet, Refills: 0      CONTINUE  these medications which have NOT CHANGED   Details  aspirin 81 MG tablet Take 81 mg by mouth daily.      estradiol (ESTRACE) 0.1 MG/GM vaginal cream Place 2 g vaginally daily as needed. For urinary tract infection     fenofibrate 160 MG tablet Take 160 mg by mouth daily.      levothyroxine (SYNTHROID, LEVOTHROID) 150 MCG tablet Take 150 mcg by mouth every morning.      pravastatin (PRAVACHOL) 20 MG tablet Take 20 mg by mouth daily.      vitamin B-12 (CYANOCOBALAMIN) 1000 MCG tablet Take 1,000 mcg by mouth daily.      Cranberry 500 MG CAPS Take 1 capsule by mouth daily.        STOP taking these medications     bumetanide (BUMEX) 2 MG tablet      losartan-hydrochlorothiazide (HYZAAR) 100-25 MG per tablet        Follow-up Information    Follow up with with PCP.         Signed: Brindley Madarang 07/24/2011, 1:02 PM  Time spent for d/c preparation 25 minutes.

## 2011-09-13 ENCOUNTER — Encounter (HOSPITAL_COMMUNITY): Payer: Self-pay | Admitting: Pharmacy Technician

## 2011-09-17 ENCOUNTER — Other Ambulatory Visit: Payer: Self-pay | Admitting: Cardiovascular Disease

## 2011-09-25 ENCOUNTER — Encounter (HOSPITAL_COMMUNITY)
Admission: RE | Disposition: A | Discharge status: Home / Self Care | Payer: Self-pay | Source: Ambulatory Visit | Attending: Cardiovascular Disease

## 2011-09-25 ENCOUNTER — Ambulatory Visit (HOSPITAL_COMMUNITY)
Admission: RE | Admit: 2011-09-25 | Discharge: 2011-09-25 | Disposition: A | Discharge status: Home / Self Care | Payer: Medicare Other | Source: Ambulatory Visit | Attending: Cardiovascular Disease | Admitting: Cardiovascular Disease

## 2011-09-25 DIAGNOSIS — I209 Angina pectoris, unspecified: Secondary | ICD-10-CM | POA: Insufficient documentation

## 2011-09-25 DIAGNOSIS — I2510 Atherosclerotic heart disease of native coronary artery without angina pectoris: Secondary | ICD-10-CM | POA: Insufficient documentation

## 2011-09-25 HISTORY — PX: LEFT HEART CATHETERIZATION WITH CORONARY ANGIOGRAM: SHX5451

## 2011-09-25 HISTORY — PX: CARDIAC CATHETERIZATION: SHX172

## 2011-09-25 SURGERY — LEFT HEART CATHETERIZATION WITH CORONARY ANGIOGRAM
Anesthesia: LOCAL

## 2011-09-25 MED ORDER — SODIUM CHLORIDE 0.9 % IV SOLN
INTRAVENOUS | Status: DC
  Administered 2011-09-25: 08:00:00 via INTRAVENOUS

## 2011-09-25 MED ORDER — DIPHENHYDRAMINE HCL 50 MG/ML IJ SOLN
25.0000 mg | INTRAMUSCULAR | Status: AC
  Administered 2011-09-25: 25 mg via INTRAVENOUS

## 2011-09-25 MED ORDER — MIDAZOLAM HCL 2 MG/2ML IJ SOLN
INTRAMUSCULAR | Status: AC
  Filled 2011-09-25: qty 2

## 2011-09-25 MED ORDER — ISOSORBIDE MONONITRATE ER 30 MG PO TB24: 30.0000 mg | ORAL_TABLET | Freq: Every day | ORAL | Status: DC

## 2011-09-25 MED ORDER — FENTANYL CITRATE 0.05 MG/ML IJ SOLN
INTRAMUSCULAR | Status: AC
  Filled 2011-09-25: qty 2

## 2011-09-25 MED ORDER — VERAPAMIL HCL 2.5 MG/ML IV SOLN
INTRAVENOUS | Status: AC
  Filled 2011-09-25: qty 2

## 2011-09-25 MED ORDER — SODIUM CHLORIDE 0.9 % IJ SOLN: 3.0000 mL | INTRAMUSCULAR | Status: DC | PRN

## 2011-09-25 MED ORDER — METHYLPREDNISOLONE SODIUM SUCC 125 MG IJ SOLR
INTRAMUSCULAR | Status: AC
  Filled 2011-09-25: qty 2

## 2011-09-25 MED ORDER — HEPARIN (PORCINE) IN NACL 2-0.9 UNIT/ML-% IJ SOLN
INTRAMUSCULAR | Status: AC
  Filled 2011-09-25: qty 2000

## 2011-09-25 MED ORDER — DIPHENHYDRAMINE HCL 50 MG/ML IJ SOLN
INTRAMUSCULAR | Status: AC
  Filled 2011-09-25: qty 1

## 2011-09-25 MED ORDER — NITROGLYCERIN 0.2 MG/ML ON CALL CATH LAB
INTRAVENOUS | Status: AC
  Filled 2011-09-25: qty 1

## 2011-09-25 MED ORDER — FAMOTIDINE IN NACL 20-0.9 MG/50ML-% IV SOLN
INTRAVENOUS | Status: AC
  Filled 2011-09-25: qty 50

## 2011-09-25 MED ORDER — HEPARIN SODIUM (PORCINE) 1000 UNIT/ML IJ SOLN
INTRAMUSCULAR | Status: AC
  Filled 2011-09-25: qty 1

## 2011-09-25 MED ORDER — FAMOTIDINE IN NACL 20-0.9 MG/50ML-% IV SOLN
20.0000 mg | INTRAVENOUS | Status: AC
  Administered 2011-09-25: 20 mg via INTRAVENOUS

## 2011-09-25 MED ORDER — METHYLPREDNISOLONE SODIUM SUCC 125 MG IJ SOLR
60.0000 mg | INTRAMUSCULAR | Status: AC
  Administered 2011-09-25: 125 mg via INTRAVENOUS

## 2011-09-25 MED ORDER — LIDOCAINE HCL (PF) 1 % IJ SOLN
INTRAMUSCULAR | Status: AC
  Filled 2011-09-25: qty 30

## 2011-09-25 NOTE — Progress Notes (Signed)
On discharge instructions noted that there was a med that "to be picked up from any pharmacy": Imdur. PA for Dr Sallyanne Kuster called in [place] to [facility] [phone]. Pt's made aware.

## 2011-09-25 NOTE — Discharge Instructions (Addendum)
Radial Site Care Refer to this sheet in the next few weeks. These instructions provide you with information on caring for yourself after your procedure. Your caregiver may also give you more specific instructions. Your treatment has been planned according to current medical practices, but problems sometimes occur. Call your caregiver if you have any problems or questions after your procedure. HOME CARE INSTRUCTIONS  You may shower the day after the procedure.Remove the bandage (dressing) and gently wash the site with plain soap and water.Gently pat the site dry.   Do not apply powder or lotion to the site.   Do not submerge the affected site in water for 3 to 5 days.   Inspect the site at least twice daily.   Do not flex or bend the affected arm for 24 hours.   No lifting over 5 pounds (2.3 kg) for 5 days after your procedure.   Do not drive home if you are discharged the same day of the procedure. Have someone else drive you.   You may drive 24 hours after the procedure unless otherwise instructed by your caregiver.  What to expect:  Any bruising will usually fade within 1 to 2 weeks.   Blood that collects in the tissue (hematoma) may be painful to the touch. It should usually decrease in size and tenderness within 1 to 2 weeks.  SEEK IMMEDIATE MEDICAL CARE IF:  You have unusual pain at the radial site.   You have redness, warmth, swelling, or pain at the radial site.   You have drainage (other than a small amount of blood on the dressing).   You have chills.   You have a fever or persistent symptoms for more than 72 hours.   You have a fever and your symptoms suddenly get worse.   Your arm becomes pale, cool, tingly, or numb.   You have heavy bleeding from the site. Hold pressure on the site.  Document Released: 10/06/2010 Document Revised: 05/16/2011 Document Reviewed: 10/06/2010 Exit[employer] Patient Information 2012 [employer]. 

## 2011-09-25 NOTE — Op Note (Signed)
Jill Preston,Jill Preston Female, 78 y.o., 1933/01/11  MRN: [id number]   CARDIAC CATHETERIZATION REPORT   Procedures performed:  1. Left heart catheterization  2. Selective coronary angiography   Reason for procedure:  Stable angina pectoris Abnormal nuclear stress test/stress echo   Procedure performed by: Thurmon Fair, MD, [facility]  Complications: none   Estimated blood loss: less than 5 mL   History:  Jill Preston is a 78 year old woman with a long-standing history of coronary artery disease who is now 12 years status post stents to the LAD artery. She has had a recurrence of anginal chest pain associated with mild to moderate exertion. This initially began when she was hyper thyroid after an adjustment of her thyroid hormone supplementation dose. Even after a reduction in the dose of levothyroxine and improvements in her thyroid hormone levels she continues to have exertional angina. A followup nuclear stress this suggests possible LAD territory ischemia although attenuation artifact limits the evaluation.  Consent: The risks, benefits, and details of the procedure were explained to the patient. Risks including death, MI, stroke, bleeding, limb ischemia, renal failure and allergy were described and accepted by the patient. Informed written consent was obtained prior to proceeding.  Technique: The patient was brought to the cardiac catheterization laboratory in the fasting state. He was prepped and draped in the usual sterile fashion. Local anesthesia with 1% lidocaine was administered to the right wrist area. Using the modified Seldinger technique a 5 French right radial artery glide sheath was introduced without difficulty. Under fluoroscopic guidance, using 5 [place] TIG and NoTorque catheters, selective cannulation of the left coronary artery and right coronary artery were respectively performed. Several coronary angiograms in a variety of projections were recorded.  A left ventriculogram was not  performed. After the procedure, hemostasis will be achieved with manual pressure.  Contrast used: 60 mL Omnipaque  Angiographic Findings:  1. The left main coronary artery is free of significant atherosclerosis and bifurcates in the usual fashion into the left anterior descending artery and left circumflex coronary artery.  2. The left anterior descending artery is a large vessel that reaches the apex and generates two major diagonal branches. There is evidence of mild luminal irregularities and minimal calcification. no hemodynamically meaningful stenoses are seen. The previously placed proximal to mid LAD stent is widely patent and free of restenosis. The second diagonal artery branch has moderate luminal irregularities and an approximately 60% stenosis. 3. The left circumflex coronary artery is a medium to large-size vessel non- dominant vessel that generates a single major oblque marginal artery and then continues as a fairly long AV groove vessel with small oblique marginal branches. There is evidence of mild luminal irregularities and mild calcification. no hemodynamically meaningful stenoses are seen. 4. The right coronary artery is a very large-size dominant vessel that generates a large posterior lateral ventricular system as well as the PDA. There is evidence of mild luminal irregularities and minimal calcification. no hemodynamically meaningful stenoses are seen.    IMPRESSIONS:  The overall territory of potential ischemia is small and attributable to a moderately diseased small second diagonal artery. Medical therapy is indicated.  RECOMMENDATION:  Risk factor modification and antianginal therapy.     CC: Jill Preston.D.

## 2011-09-25 NOTE — H&P (Signed)
History reviewed, patient examined, no change in status, stable for surgery. Thurmon Fair, MD, [facility] [phone] 09/25/2011 8:48 AM

## 2011-09-28 ENCOUNTER — Other Ambulatory Visit (HOSPITAL_COMMUNITY): Payer: Self-pay | Admitting: Internal Medicine

## 2011-09-28 DIAGNOSIS — Z1231 Encounter for screening mammogram for malignant neoplasm of breast: Secondary | ICD-10-CM

## 2011-10-24 ENCOUNTER — Ambulatory Visit (HOSPITAL_COMMUNITY)
Admission: RE | Admit: 2011-10-24 | Discharge: 2011-10-24 | Disposition: A | Discharge status: Home / Self Care | Payer: Medicare Other | Source: Ambulatory Visit | Attending: Internal Medicine | Admitting: Internal Medicine

## 2011-10-24 DIAGNOSIS — Z1231 Encounter for screening mammogram for malignant neoplasm of breast: Secondary | ICD-10-CM | POA: Insufficient documentation

## 2012-06-03 DIAGNOSIS — N398 Other specified disorders of urinary system: Secondary | ICD-10-CM | POA: Insufficient documentation

## 2012-06-03 DIAGNOSIS — N3941 Urge incontinence: Secondary | ICD-10-CM | POA: Insufficient documentation

## 2012-06-03 DIAGNOSIS — L900 Lichen sclerosus et atrophicus: Secondary | ICD-10-CM | POA: Insufficient documentation

## 2012-06-03 DIAGNOSIS — N390 Urinary tract infection, site not specified: Secondary | ICD-10-CM | POA: Insufficient documentation

## 2012-06-03 DIAGNOSIS — N816 Rectocele: Secondary | ICD-10-CM | POA: Insufficient documentation

## 2012-08-05 DIAGNOSIS — E785 Hyperlipidemia, unspecified: Secondary | ICD-10-CM | POA: Insufficient documentation

## 2012-08-06 DIAGNOSIS — J45909 Unspecified asthma, uncomplicated: Secondary | ICD-10-CM | POA: Insufficient documentation

## 2012-10-23 DIAGNOSIS — B3731 Acute candidiasis of vulva and vagina: Secondary | ICD-10-CM | POA: Insufficient documentation

## 2012-10-23 DIAGNOSIS — B373 Candidiasis of vulva and vagina: Secondary | ICD-10-CM | POA: Insufficient documentation

## 2012-11-10 ENCOUNTER — Ambulatory Visit
Admission: RE | Admit: 2012-11-10 | Discharge: 2012-11-10 | Disposition: A | Discharge status: Home / Self Care | Payer: Medicare Other | Source: Ambulatory Visit | Attending: Internal Medicine | Admitting: Internal Medicine

## 2012-11-10 ENCOUNTER — Other Ambulatory Visit: Payer: Self-pay | Admitting: Family

## 2012-11-10 DIAGNOSIS — M549 Dorsalgia, unspecified: Secondary | ICD-10-CM

## 2012-12-05 ENCOUNTER — Other Ambulatory Visit: Payer: Self-pay | Admitting: Neurosurgery

## 2012-12-05 DIAGNOSIS — M5410 Radiculopathy, site unspecified: Secondary | ICD-10-CM

## 2012-12-05 DIAGNOSIS — M48061 Spinal stenosis, lumbar region without neurogenic claudication: Secondary | ICD-10-CM

## 2012-12-12 ENCOUNTER — Ambulatory Visit
Admission: RE | Admit: 2012-12-12 | Discharge: 2012-12-12 | Disposition: A | Discharge status: Home / Self Care | Payer: Medicare Other | Source: Ambulatory Visit | Attending: Neurosurgery | Admitting: Neurosurgery

## 2012-12-12 DIAGNOSIS — M5410 Radiculopathy, site unspecified: Secondary | ICD-10-CM

## 2012-12-12 DIAGNOSIS — M48061 Spinal stenosis, lumbar region without neurogenic claudication: Secondary | ICD-10-CM

## 2013-02-23 ENCOUNTER — Telehealth: Payer: Self-pay | Admitting: Cardiovascular Disease

## 2013-02-23 NOTE — Telephone Encounter (Signed)
Returned call.  Pt stated Sunday morning she got up and "had all this chest pain."  Stated it was in the middle of her chest and when she takes a deep breath, it shoots out to the right.  Pt wanted to know if she should get it checked out.  Denied CP today unless she takes a "real deep breath."  Pt also c/o SOB with any exertion.  Stated she has been SOB before, but this is different since she had the epidural. Message forwarded to L. Diona Fanti, PA-C for further instructions.

## 2013-02-23 NOTE — Telephone Encounter (Signed)
After speaking w/ pt, L. Kilroy, PA-C advised RN schedule pt to come in within a week to see him.  Also advised to take aleve for pain and if no improvement to call back.  Pt will need an appt w/ Dr. [name] for her annual f/u.  Will schedule defer until after visit w/ Bluford Kaufmann, PA-C.  Appt scheduled for Thursday, June 12th at 8:40am w/ Diona Fanti, PA-C.  ER instructions given and pt verbalized understanding and agreed w/ plan.

## 2013-02-23 NOTE — Telephone Encounter (Signed)
Please call-had epidural 3 wks ago-Neck was swollen at the base of her neck,not now-no energy-middle of chest hurt ,started hurting yesterday-head aching also-wonder if Epidural affected her heart?Also SOB!

## 2013-02-23 NOTE — Telephone Encounter (Signed)
I spoke with the pt. She has been having atypical pleuritic chest pain over the weekend. It radiates to the Rt side of her chest with inspiration. She also has various other somatic complaints and is somewhat of a difficult historian, answering direct questions with long anecdotes.. She denies orthopnea or hemoptysis. She had a Temp of 100 yesterday but not today. No cough. No lower extremity edema. She has chronic SOB which is unchanged. I suggested Aleve BID prn. She is overdue for an apt and we will schedule her to be seen next week. She will call if her symptoms change in which case we will see her sooner.  Corine Shelter PA-C 02/23/2013 1:17 PM

## 2013-02-26 ENCOUNTER — Encounter (HOSPITAL_COMMUNITY): Payer: Self-pay | Admitting: Physician Assistant

## 2013-02-26 ENCOUNTER — Ambulatory Visit (INDEPENDENT_AMBULATORY_CARE_PROVIDER_SITE_OTHER): Payer: Medicare Other | Admitting: Cardiology

## 2013-02-26 ENCOUNTER — Encounter: Payer: Self-pay | Admitting: Cardiology

## 2013-02-26 VITALS — BP 136/80 | HR 59 | Ht 65.0 in | Wt 216.5 lb

## 2013-02-26 DIAGNOSIS — R0609 Other forms of dyspnea: Secondary | ICD-10-CM | POA: Insufficient documentation

## 2013-02-26 DIAGNOSIS — R079 Chest pain, unspecified: Secondary | ICD-10-CM | POA: Insufficient documentation

## 2013-02-26 DIAGNOSIS — R0600 Dyspnea, unspecified: Secondary | ICD-10-CM | POA: Insufficient documentation

## 2013-02-26 DIAGNOSIS — M1990 Unspecified osteoarthritis, unspecified site: Secondary | ICD-10-CM

## 2013-02-26 DIAGNOSIS — I10 Essential (primary) hypertension: Secondary | ICD-10-CM

## 2013-02-26 DIAGNOSIS — I2510 Atherosclerotic heart disease of native coronary artery without angina pectoris: Secondary | ICD-10-CM | POA: Insufficient documentation

## 2013-02-26 DIAGNOSIS — I872 Venous insufficiency (chronic) (peripheral): Secondary | ICD-10-CM

## 2013-02-26 DIAGNOSIS — R0989 Other specified symptoms and signs involving the circulatory and respiratory systems: Secondary | ICD-10-CM

## 2013-02-26 DIAGNOSIS — E785 Hyperlipidemia, unspecified: Secondary | ICD-10-CM | POA: Insufficient documentation

## 2013-02-26 MED ORDER — ASPIRIN EC 81 MG PO TBEC: 81.0000 mg | DELAYED_RELEASE_TABLET | Freq: Every day | ORAL | Status: DC

## 2013-02-26 MED ORDER — ISOSORBIDE MONONITRATE ER 30 MG PO TB24: 30.0000 mg | ORAL_TABLET | Freq: Every day | ORAL | Status: DC

## 2013-02-26 MED ORDER — NITROGLYCERIN 0.4 MG SL SUBL: 0.4000 mg | SUBLINGUAL_TABLET | SUBLINGUAL | Status: DC | PRN

## 2013-02-26 NOTE — Patient Instructions (Signed)
Resume Imdur. Myoview to be scheduled. NTG if needed for chest pressure, tightness.

## 2013-02-26 NOTE — Assessment & Plan Note (Signed)
Last cath 1/13

## 2013-02-26 NOTE — Assessment & Plan Note (Signed)
She complains of dyspnea doing housework, sweeping, vacuuming. Relieved with rest.

## 2013-02-26 NOTE — Assessment & Plan Note (Signed)
Some atypical symptoms- worse with deep inspiration, worse with movement, "sharp pain"

## 2013-02-26 NOTE — Progress Notes (Signed)
02/26/2013 Jill Preston   04/09/1933  BJ:[id number]  Primary Physicia Salena Saner., MD Primary Cardiologist: Dr Sallyanne Kuster  HPI:  79 y/o with a history of CAD. She had an LAD stent placed in Dec 2000. This has been patent of restudy in '02 and Jan 2013. She does have some residual Dx disease treated medically. She is in the office today with complaints of chest pain and dyspnea. Her chest pain sounds a little atypical at first- sharp, worse with deep breathing and movement. She also admits to dyspnea and "tightness" when sweeping or vacuuming. In addition she is currently being Rx'd for back DJD, she had her second epidural yesterday. She is not on an aspirin and her Imdur has not been refilled.   Current Outpatient Prescriptions  Medication Sig Dispense Refill  . Co-Enzyme Q-10 100 MG CAPS Take 1 capsule by mouth daily.        . Cranberry 500 MG CAPS Take 1 capsule by mouth daily.        Marland Kitchen docusate sodium (COLACE) 50 MG capsule Take by mouth 2 (two) times daily.      Marland Kitchen estradiol (ESTRACE) 0.1 MG/GM vaginal cream Place 2 g vaginally daily as needed. For urinary tract infection       . fenofibrate 160 MG tablet Take 160 mg by mouth daily.        Marland Kitchen levothyroxine (SYNTHROID, LEVOTHROID) 125 MCG tablet Take 125 mcg by mouth daily before breakfast.      . losartan-hydrochlorothiazide (HYZAAR) 100-25 MG per tablet Take 1 tablet by mouth daily.        . Magnesium 250 MG TABS Take 1 tablet by mouth daily.        . Multiple Vitamins-Minerals (CENTRUM SILVER ADULT 50+) TABS Take by mouth.      . Multiple Vitamins-Minerals (ICAPS MV PO) Take by mouth.      Marland Kitchen POTASSIUM GLUCONATE PO Take 1 tablet by mouth daily.        . pravastatin (PRAVACHOL) 20 MG tablet Take 20 mg by mouth daily.        Marland Kitchen pyridoxine (B-6) 100 MG tablet Take 100 mg by mouth daily.      . vitamin B-12 (CYANOCOBALAMIN) 1000 MCG tablet Take 2,500 mcg by mouth daily.       . vitamin C (ASCORBIC ACID) 250 MG tablet Take 1,000 mg by  mouth daily.       Marland Kitchen aspirin EC 81 MG tablet Take 1 tablet (81 mg total) by mouth daily.  90 tablet  3  . isosorbide mononitrate (IMDUR) 30 MG 24 hr tablet Take 1 tablet (30 mg total) by mouth daily.  30 tablet  10  . nitroGLYCERIN (NITROSTAT) 0.4 MG SL tablet Place 1 tablet (0.4 mg total) under the tongue every 5 (five) minutes as needed for chest pain.  25 tablet  2   No current facility-administered medications for this visit.    Allergies  Allergen Reactions  . Atorvastatin Anaphylaxis    Rxn unknown  . Latex     Rxn is blisters  . Shellfish Allergy Nausea And Vomiting  . Ciprofloxacin Hives  . Codeine Nausea And Vomiting  . Contrast Media (Iodinated Diagnostic Agents)     Flushing  . Gelnique (Oxybutynin)     Rxn is blisters  . Penicillins Hives    History   Social History  . Marital Status: Married    Spouse Name: N/A    Number of Children: N/A  . Years  of Education: N/A   Occupational History  . Not on file.   Social History Main Topics  . Smoking status: Never Smoker   . Smokeless tobacco: Not on file  . Alcohol Use: No  . Drug Use: No  . Sexually Active: Not on file   Other Topics Concern  . Not on file   Social History Narrative  . No narrative on file     Review of Systems: General: negative for chills, fever, night sweats or weight changes.  Cardiovascular: negative for chest pain, dyspnea on exertion, edema, orthopnea, palpitations, paroxysmal nocturnal dyspnea or shortness of breath Dermatological: negative for rash Respiratory: negative for cough or wheezing Urologic: negative for hematuria Abdominal: negative for nausea, vomiting, diarrhea, bright red blood per rectum, melena, or hematemesis Neurologic: negative for visual changes, syncope, or dizziness All other systems reviewed and are otherwise negative except as noted above.    Blood pressure 136/80, pulse 59, height 5\' 5"  (1.651 m), weight 216 lb 8 oz (98.204 kg).  General appearance:  alert, cooperative, no distress and moderately obese Neck: no carotid bruit and no JVD Lungs: clear to auscultation bilaterally Heart: regular rate and rhythm Extrem: Trace edema  EKG  EKG: normal EKG, normal sinus rhythm, unchanged from previous tracings, sinus bradycardia.  ASSESSMENT AND PLAN:   Dyspnea on exertion She complains of dyspnea doing housework, sweeping, vacuuming. Relieved with rest.  Chest pain Some atypical symptoms- worse with deep inspiration, worse with movement, "sharp pain"  DJD (degenerative joint disease)- back She is currently being treated for back pain- Dr Arnoldo Morale follows. She had her second epidural yesterday.  Venous insufficiency Chronic. Venous dopplers done last year  CAD LAD stent 12/00. Patent '02 and 1/13 Last cath 1/13  Morbid obesity BMI 36  HTN (hypertension) controlled   PLAN  I ordered a Myoview. She will resume her ASA 81mg  and Imdur. Follow up with Dr Sallyanne Kuster in a few weeks as he has not seen her in  More than a a year.   [facility] KPA-C 02/26/2013 9:38 AM

## 2013-02-26 NOTE — Assessment & Plan Note (Signed)
She is currently being treated for back pain- Dr Lovell Sheehan follows. She had her second epidural yesterday.

## 2013-02-26 NOTE — Assessment & Plan Note (Signed)
controlled 

## 2013-02-26 NOTE — Assessment & Plan Note (Addendum)
BMI 36 

## 2013-02-26 NOTE — Assessment & Plan Note (Signed)
Chronic. Venous dopplers done last year

## 2013-03-03 ENCOUNTER — Encounter: Payer: Self-pay | Admitting: Cardiovascular Disease

## 2013-03-05 ENCOUNTER — Ambulatory Visit (HOSPITAL_COMMUNITY)
Admission: RE | Admit: 2013-03-05 | Discharge: 2013-03-05 | Disposition: A | Discharge status: Home / Self Care | Payer: Medicare Other | Source: Ambulatory Visit | Attending: Cardiovascular Disease | Admitting: Cardiovascular Disease

## 2013-03-05 DIAGNOSIS — R0609 Other forms of dyspnea: Secondary | ICD-10-CM | POA: Insufficient documentation

## 2013-03-05 DIAGNOSIS — I2510 Atherosclerotic heart disease of native coronary artery without angina pectoris: Secondary | ICD-10-CM | POA: Insufficient documentation

## 2013-03-05 DIAGNOSIS — R42 Dizziness and giddiness: Secondary | ICD-10-CM | POA: Insufficient documentation

## 2013-03-05 DIAGNOSIS — R0600 Dyspnea, unspecified: Secondary | ICD-10-CM

## 2013-03-05 DIAGNOSIS — R0989 Other specified symptoms and signs involving the circulatory and respiratory systems: Secondary | ICD-10-CM | POA: Insufficient documentation

## 2013-03-05 DIAGNOSIS — R079 Chest pain, unspecified: Secondary | ICD-10-CM | POA: Insufficient documentation

## 2013-03-05 DIAGNOSIS — Z9861 Coronary angioplasty status: Secondary | ICD-10-CM | POA: Insufficient documentation

## 2013-03-05 MED ORDER — TECHNETIUM TC 99M SESTAMIBI GENERIC - CARDIOLITE
10.9000 | Freq: Once | INTRAVENOUS | Status: AC | PRN
  Administered 2013-03-05: 10.9 via INTRAVENOUS

## 2013-03-05 MED ORDER — REGADENOSON 0.4 MG/5ML IV SOLN
0.4000 mg | Freq: Once | INTRAVENOUS | Status: AC
  Administered 2013-03-05: 0.4 mg via INTRAVENOUS

## 2013-03-05 MED ORDER — AMINOPHYLLINE 25 MG/ML IV SOLN
75.0000 mg | Freq: Once | INTRAVENOUS | Status: AC
  Administered 2013-03-05: 75 mg via INTRAVENOUS

## 2013-03-05 MED ORDER — TECHNETIUM TC 99M SESTAMIBI GENERIC - CARDIOLITE
32.6000 | Freq: Once | INTRAVENOUS | Status: AC | PRN
  Administered 2013-03-05: 33 via INTRAVENOUS

## 2013-03-05 NOTE — Procedures (Addendum)
[facility] [address] [place] [address] [phone]  Cardiology Nuclear Med Study  Jill Preston is a 79 y.o. female     MRN : [id number]     DOB: [birth year]  Procedure Date: 03/05/2013  Nuclear Med Background Indication for Stress Test:  Stent Patency History:  CAD;STENT/PTCA--08/1999;STENT--2002 AND 2013 Cardiac Risk Factors: Family History - CAD, Hypertension, Lipids, Obesity and VENOUS INSUFFICIENCY  Symptoms:  Chest Pain, DOE, Light-Headedness and SOB   Nuclear Pre-Procedure Caffeine/Decaff Intake:  1:00am NPO After: 11AM   IV Site: R Forearm  IV 0.9% NS with Angio Cath:  22g  Chest Size (in):  N/A IV Started by: Emmit Pomfret, RN  Height: 5\' 5"  (1.651 m)  Cup Size: D  BMI:  Body mass index is 35.94 kg/(m^2). Weight:  216 lb (97.977 kg)   Tech Comments:  N/A    Nuclear Med Study 1 or 2 day study: 1 day  Stress Test Type:  Lexiscan  Order Authorizing Provider:  Thurmon Fair, MD   Resting Radionuclide: Technetium 76m Sestamibi  Resting Radionuclide Dose: 10.9 mCi   Stress Radionuclide:  Technetium 25m Sestamibi  Stress Radionuclide Dose: 32.6 mCi           Stress Protocol Rest HR: 60 Stress HR: 66  Rest BP: 155/90 Stress BP: 151/75  Exercise Time (min): n/a METS: n/a   Predicted Max HR: 143 bpm % Max HR: 46.15 bpm Rate Pressure Product: 8844  Dose of Adenosine (mg):  n/a Dose of Lexiscan: 0.4 mg  Dose of Atropine (mg): n/a Dose of Dobutamine: n/a mcg/kg/min (at max HR)  Stress Test Technologist: Esperanza Sheets, CCT Nuclear Technologist: Gonzella Lex, CNMT   Rest Procedure:  Myocardial perfusion imaging was performed at rest 45 minutes following the intravenous administration of Technetium 62m Sestamibi. Stress Procedure:  The patient received IV Lexiscan 0.4 mg over 15-seconds.  Technetium 55m Sestamibi injected at 30-seconds.  The patient experienced SOB and lightheadedness; 75 mg IV  Aminophylline was administered with resolutionof symptoms.  There were no significant changes with Lexiscan.  Quantitative spect images were obtained after a 45 minute delay.  Transient Ischemic Dilatation (Normal <1.22):  0.94 Lung/Heart Ratio (Normal <0.45):  0.27 QGS EDV:  71 ml QGS ESV:  22 ml LV Ejection Fraction: 69%  Rest ECG: NSR - Normal EKG  Stress ECG: No significant change from baseline ECG  QPS Raw Data Images:  Normal; no motion artifact; normal heart/lung ratio. Stress Images:  There is decreased uptake in the anterior wall. Rest Images:  There is decreased uptake in the anterior wall. Subtraction (SDS):  These findings are consistent with ischemia. The SDS score is 5.  Impression Exercise Capacity:  Lexiscan with no exercise. BP Response:  Normal blood pressure response. Clinical Symptoms:  No significant symptoms noted. ECG Impression:  No significant ECG changes with Lexiscan. Comparison with Prior Nuclear Study: There is a persistent reversible anterior defect which was previously identified to be diagonal ischemia.    Overall Impression:  Intermediate risk stress nuclear study with reversible anterior ischemia - SDS 5..  LV Wall Motion:  NL LV Function; NL Wall Motion; EF 69%.  Chrystie Nose, MD, [facility] Board Certified in Nuclear Cardiology Attending Cardiologist [facility]  Chrystie Nose, MD  03/05/2013 5:54 PM

## 2013-03-06 ENCOUNTER — Telehealth: Payer: Self-pay | Admitting: Cardiovascular Disease

## 2013-03-06 NOTE — Telephone Encounter (Signed)
Mrs. Freeze is returning your call .Marland Kitchen

## 2013-03-10 NOTE — Telephone Encounter (Signed)
appt 03/11/13 w/Dr. [name] for test results

## 2013-03-11 ENCOUNTER — Ambulatory Visit (INDEPENDENT_AMBULATORY_CARE_PROVIDER_SITE_OTHER): Payer: Medicare Other | Admitting: Cardiovascular Disease

## 2013-03-11 ENCOUNTER — Encounter: Payer: Self-pay | Admitting: Cardiovascular Disease

## 2013-03-11 VITALS — BP 128/78 | HR 60 | Resp 20 | Ht 65.5 in | Wt 219.8 lb

## 2013-03-11 DIAGNOSIS — I2510 Atherosclerotic heart disease of native coronary artery without angina pectoris: Secondary | ICD-10-CM

## 2013-03-11 DIAGNOSIS — N189 Chronic kidney disease, unspecified: Secondary | ICD-10-CM

## 2013-03-11 DIAGNOSIS — E785 Hyperlipidemia, unspecified: Secondary | ICD-10-CM

## 2013-03-11 DIAGNOSIS — E039 Hypothyroidism, unspecified: Secondary | ICD-10-CM

## 2013-03-11 MED ORDER — RANOLAZINE ER 500 MG PO TB12: 500.0000 mg | ORAL_TABLET | Freq: Two times a day (BID) | ORAL | Status: DC

## 2013-03-11 MED ORDER — RANOLAZINE ER 1000 MG PO TB12: 1000.0000 mg | ORAL_TABLET | Freq: Two times a day (BID) | ORAL | Status: DC

## 2013-03-11 NOTE — Assessment & Plan Note (Addendum)
Her nuclear stress test is abnormal, but the area of ischemia is small and fits quite well with the known stenosis in the second diagonal artery. The distribution of the defect does not suggest proximal LAD stenosis or left main disease. Systolic function is preserved. Her symptoms are consistent with exertional angina, CCS class 2-3. Her report of low blood pressure after physical exercise is a little concerning, but it is difficult to believe that she has progressed to three-vessel coronary artery disease and/or a left main stenosis in just 18 months. She showed little if any progression of disease over a period of 11 years leading up to the recent cardiac catheterization in January 2013. Her blood pressure is relatively low. I do not think adding beta blockers or calcium channel blockers will have salutary effects. I recommended that she start Ranexa 500 mg twice a day for one week and then 1000 mg twice a day thereafter. If her symptoms are well compensated after this treatment, I would stop there. If not she should proceed to repeat cardiac catheterization. Notes that she had difficulty with coronary angiography from the radial approach and if coronary angiography is necessary again she prefers the traditional femoral approach.

## 2013-03-11 NOTE — Progress Notes (Signed)
Patient ID: Jill Preston, female   DOB: 08/19/1933, 79 y.o.   MRN: [id number]     Reason for office visit Followup abnormal nuclear stress test  79 y/o with a history of CAD. She had an LAD stent placed in Dec 2000. This has been patent of restudy in '02 and Jan 2013. She does have some residual Dx disease treated medically. She is in the office today with complaints of chest pain and dyspnea. Her chest pain sounds a little atypical at first- sharp, worse with deep breathing and movement. She also admits to dyspnea and "tightness" when sweeping or vacuuming. In addition she is currently being Rx'd for back DJD, she had her second epidural yesterday.   She had a LexiScan Myoview performed recently which showed a relatively small anterior wall reversible defect. This corresponds quite well with a known stenosis in the second diagonal artery that was left for medical therapy after her last cath. Left ventricular systolic function is normal. She states that the discomfort she felt in her chest during a LexiScan study was identical with her clinical complaints of left-sided chest pain.  She also says that she is unable to perform usual activities such as sweeping mopping or vacuuming because of sudden onset of severe weakness. She checks her blood pressure when she feels this way and it has been low at around 99/60 mm Hg      Allergies  Allergen Reactions  . Atorvastatin Anaphylaxis    Rxn unknown  . Latex     Rxn is blisters  . Shellfish Allergy Nausea And Vomiting  . Ciprofloxacin Hives  . Codeine Nausea And Vomiting  . Contrast Media (Iodinated Diagnostic Agents)     Flushing  . Gelnique (Oxybutynin)     Rxn is blisters  . Penicillins Hives    Current Outpatient Prescriptions  Medication Sig Dispense Refill  . aspirin EC 81 MG tablet Take 1 tablet (81 mg total) by mouth daily.  90 tablet  3  . Co-Enzyme Q-10 100 MG CAPS Take 1 capsule by mouth daily.        . Cranberry 500 MG  CAPS Take 1 capsule by mouth daily.        Marland Kitchen docusate sodium (COLACE) 50 MG capsule Take by mouth 2 (two) times daily.      Marland Kitchen estradiol (ESTRACE) 0.1 MG/GM vaginal cream Place 2 g vaginally daily as needed. For urinary tract infection       . fenofibrate 160 MG tablet Take 160 mg by mouth daily.        . furosemide (LASIX) 20 MG tablet Take 20 mg by mouth daily as needed.      . isosorbide mononitrate (IMDUR) 30 MG 24 hr tablet Take 1 tablet (30 mg total) by mouth daily.  30 tablet  10  . levothyroxine (SYNTHROID, LEVOTHROID) 125 MCG tablet Take 125 mcg by mouth daily before breakfast.      . losartan-hydrochlorothiazide (HYZAAR) 100-25 MG per tablet Take 1 tablet by mouth daily.        . Magnesium 250 MG TABS Take 1 tablet by mouth daily.        . Multiple Vitamins-Minerals (CENTRUM SILVER ADULT 50+) TABS Take by mouth.      . Multiple Vitamins-Minerals (ICAPS MV PO) Take by mouth.      . nitroGLYCERIN (NITROSTAT) 0.4 MG SL tablet Place 1 tablet (0.4 mg total) under the tongue every 5 (five) minutes as needed for chest pain.  25  tablet  2  . potassium chloride (K-DUR) 10 MEQ tablet Take 10 mEq by mouth as needed.      . pravastatin (PRAVACHOL) 20 MG tablet Take 20 mg by mouth daily.        Marland Kitchen pyridoxine (B-6) 100 MG tablet Take 100 mg by mouth daily.      . vitamin B-12 (CYANOCOBALAMIN) 1000 MCG tablet Take 2,500 mcg by mouth daily.       . vitamin C (ASCORBIC ACID) 250 MG tablet Take 1,000 mg by mouth daily.       Marland Kitchen POTASSIUM GLUCONATE PO Take 1 tablet by mouth daily.         No current facility-administered medications for this visit.    Past Medical History  Diagnosis Date  . SOB (shortness of breath)     2D ECHO, 04/27/2010 - EF >55%, normal  . Chest pain, atypical     LEXISCAN, 08/29/2011 - anterior wall defect, partially reversible-suggestive of artifact and mild ischemia in LAD, post stress EF 68%, no ECG changes  . Edema of lower extremity     LEA VENOUS, 10/17/2011 - mild reflux  in bilateral common femoral veins    Past Surgical History  Procedure Laterality Date  . Cardiac catheterization Left 09/25/2011    Medical management  . Cardiac catheterization Left 03/25/2001    Normal LV function, LAD residual narrowing of less than 10%, normal ramus intermediate, circumflex, and RCA,   . Cardiac catheterization  09/04/1999    LAD, 3x44mm Tetra stent resulting in a reduction of the 80% stenosis to 0% residual    Family History  Problem Relation Age of Onset  . Cancer Mother   . Heart disease Father   . Heart disease Sister   . Heart disease Brother     History   Social History  . Marital Status: Married    Spouse Name: N/A    Number of Children: N/A  . Years of Education: N/A   Occupational History  . Not on file.   Social History Main Topics  . Smoking status: Never Smoker   . Smokeless tobacco: Not on file  . Alcohol Use: No  . Drug Use: No  . Sexually Active: Not on file   Other Topics Concern  . Not on file   Social History Narrative  . No narrative on file    Review of systems: She complains of exertional fatigue, left-sided chest discomfort and dyspnea. The patient specifically denies any chest pain at rest, dyspnea at rest, orthopnea, paroxysmal nocturnal dyspnea, syncope, palpitations, focal neurological deficits, intermittent claudication, lower extremity edema, unexplained weight gain, cough, hemoptysis or wheezing.  The patient also denies abdominal pain, nausea, vomiting, dysphagia, diarrhea, constipation, polyuria, polydipsia, dysuria, hematuria, frequency, urgency, abnormal bleeding or bruising, fever, chills, unexpected weight changes, mood swings, change in skin or hair texture, change in voice quality, auditory or visual problems, allergic reactions or rashes, new musculoskeletal complaints other than usual "aches and pains" , mostly in her back and legs.   PHYSICAL EXAM BP 128/78  Pulse 60  Resp 20  Ht 5' 5.5" (1.664 m)  Wt 219  lb 12.8 oz (99.701 kg)  BMI 36.01 kg/m2  General: Alert, oriented x3, no distress Head: no evidence of trauma, PERRL, EOMI, no exophtalmos or lid lag, no myxedema, no xanthelasma; normal ears, nose and oropharynx Neck: normal jugular venous pulsations and no hepatojugular reflux; brisk carotid pulses without delay and no carotid bruits Chest: clear to auscultation, no  signs of consolidation by percussion or palpation, normal fremitus, symmetrical and full respiratory excursions Cardiovascular: normal position and quality of the apical impulse, regular rhythm, normal first and second heart sounds, no murmurs, rubs or gallops Abdomen: no tenderness or distention, no masses by palpation, no abnormal pulsatility or arterial bruits, normal bowel sounds, no hepatosplenomegaly Extremities: no clubbing, cyanosis or edema; 2+ radial, ulnar and brachial pulses bilaterally; 2+ right femoral, posterior tibial and dorsalis pedis pulses; 2+ left femoral, posterior tibial and dorsalis pedis pulses; no subclavian or femoral bruits Neurological: grossly nonfocal    BMET    Component Value Date/Time   NA 141 07/24/2011 0620   K 3.8 07/24/2011 0620   CL 104 07/24/2011 0620   CO2 28 07/24/2011 0620   GLUCOSE 97 07/24/2011 0620   BUN 11 07/24/2011 0620   CREATININE 1.77* 07/24/2011 0620   CALCIUM 9.7 07/24/2011 0620   GFRNONAA 27* 07/24/2011 0620   GFRAA 31* 07/24/2011 0620   LEXISCAN MYOVIEW 02/26/2013 Impression Exercise Capacity: Lexiscan with no exercise. BP Response: Normal blood pressure response. Clinical Symptoms: No significant symptoms noted. ECG Impression: No significant ECG changes with Lexiscan. Comparison with Prior Nuclear Study: There is a persistent  reversible anterior defect which was previously identified to be  diagonal ischemia.   Overall Impression: Intermediate risk stress nuclear study with  reversible anterior ischemia - SDS 5..  LV Wall Motion: NL LV Function; NL Wall Motion; EF  69%.  Chrystie Nose, MD, [facility]    ASSESSMENT AND PLAN CAD LAD stent 12/00. Patent '02 and 1/13 Her nuclear stress test is abnormal, but the area of ischemia is small and fits quite well with the known stenosis in the second diagonal artery. The distribution of the defect does not suggest proximal LAD stenosis or left main disease. Systolic function is preserved. Her symptoms are consistent with exertional angina, CCS class 2-3. Her report of low blood pressure after physical exercise is a little concerning, but it is difficult to believe that she has progressed to three-vessel coronary artery disease and/or a left main stenosis in just 18 months. She showed little if any progression of disease over a period of 11 years leading up to the recent cardiac catheterization in January 2013. Her blood pressure is relatively low. I do not think adding beta blockers or calcium channel blockers will have salutary effects. I recommended that she start Ranexa 500 mg twice a day for one week and then 1000 mg twice a day thereafter. If her symptoms are well compensated after this treatment, I would stop there. If not she should proceed to repeat cardiac catheterization. Notes that she had difficulty with coronary angiography from the radial approach and if coronary angiography is necessary again she prefers the traditional femoral approach.  Dyslipidemia Recheck a lipid profile prior to her next appointment  Hypothyroidism Followup on TSH level. At one point it appeared that her increasing angina was related to iatrogenic hyperthyroid  Morbid obesity Once her angina is better controlled, regular physical exercise would be very helpful to assist with weight loss  CKD (chronic kidney disease) Although her most recent creatinine that I have available for review (January 2013) was normal at 1.1, chest had numerous creatinine levels between 1.5 and 1.7 over the last couple of years. Her creatinine peaked at 2.0  during an episode of acute renal failure within the last 2 years. Kidney disease is a major disincentive to proceed with coronary angiography and contrast exposure for what appears to  be diagonal branch stenosis.    Meds ordered this encounter  Medications  . furosemide (LASIX) 20 MG tablet    Sig: Take 20 mg by mouth daily as needed.  . potassium chloride (K-DUR) 10 MEQ tablet    Sig: Take 10 mEq by mouth as needed.  . ranolazine (RANEXA) 500 MG 12 hr tablet    Sig: Take 1 tablet (500 mg total) by mouth 2 (two) times daily.    Dispense:  14 tablet    Refill:  0  . ranolazine (RANEXA) 1000 MG SR tablet    Sig: Take 1 tablet (1,000 mg total) by mouth 2 (two) times daily.    Dispense:  42 tablet    Refill:  0    [id number] Exp: 06/2016    Junious Silk, MD, [facility] [phone] office [phone] pager

## 2013-03-11 NOTE — Patient Instructions (Signed)
.  Your physician has recommended you make the following change in your medication: start Ranexa 500 mg twice a day for one week, then 1000 mg twice a day for 3 weeks Your physician recommends that you schedule a follow-up appointment in: 4 weeks. Cardiac catheterization may be necessary if you do not improve with this treatment.

## 2013-03-11 NOTE — Assessment & Plan Note (Signed)
Once her angina is better controlled, regular physical exercise would be very helpful to assist with weight loss

## 2013-03-11 NOTE — Assessment & Plan Note (Signed)
Followup on TSH level. At one point it appeared that her increasing angina was related to iatrogenic hyperthyroid

## 2013-03-11 NOTE — Assessment & Plan Note (Signed)
Recheck a lipid profile prior to her next appointment

## 2013-03-11 NOTE — Assessment & Plan Note (Signed)
Although her most recent creatinine that I have available for review (January 2013) was normal at 1.1, chest had numerous creatinine levels between 1.5 and 1.7 over the last couple of years. Her creatinine peaked at 2.0 during an episode of acute renal failure within the last 2 years. Kidney disease is a major disincentive to proceed with coronary angiography and contrast exposure for what appears to be diagonal branch stenosis.

## 2013-04-08 ENCOUNTER — Ambulatory Visit (INDEPENDENT_AMBULATORY_CARE_PROVIDER_SITE_OTHER): Payer: Medicare Other | Admitting: Cardiovascular Disease

## 2013-04-08 ENCOUNTER — Encounter: Payer: Self-pay | Admitting: Cardiovascular Disease

## 2013-04-08 VITALS — BP 116/62 | HR 68 | Ht 65.5 in | Wt 218.2 lb

## 2013-04-08 DIAGNOSIS — E785 Hyperlipidemia, unspecified: Secondary | ICD-10-CM

## 2013-04-08 DIAGNOSIS — I2510 Atherosclerotic heart disease of native coronary artery without angina pectoris: Secondary | ICD-10-CM

## 2013-04-08 MED ORDER — ISOSORBIDE MONONITRATE ER 30 MG PO TB24: 30.0000 mg | ORAL_TABLET | Freq: Two times a day (BID) | ORAL | Status: DC

## 2013-04-08 NOTE — Patient Instructions (Addendum)
Your physician has recommended you make the following change in your medication: Increase isosorbide mononitrate to a total of 60 mg a day. Depending on the pattern of your chest discomfort, he may choose to take both 30 mg tablets every morning or choose to split the medication until morning and evening dose. Your physician recommends that you return for lab work first available. Your physician recommends that you schedule a follow-up appointment in: 4 weeks

## 2013-04-08 NOTE — Assessment & Plan Note (Signed)
Ms. Gafford has mild angina pectoris that is usually exertional. Unfortunately there is little room to adjust her antianginal is due to low blood pressure. Ranexa seemed to work but was associated with intolerable constipation. The symptoms at this point are mild and her recent nuclear stress test is reassuring. I think we will try to avoid invasive coronary angiography, especially knowing that the diagonal artery stenosis was not felt amenable to PCI. She will increase the isosorbide mononitrate to 60 mg a day. She would probably do best if she takes the entire amount early in the morning, but if she has nocturnal angina (as she sometimes does) she may choose to split it into twice daily administration.

## 2013-04-08 NOTE — Progress Notes (Signed)
Patient ID: Jill Preston, female   DOB: 1933/04/12, 79 y.o.   MRN: [id number]     Reason for office visit Coronary artery disease  79 y/o with a history of CAD. She had an LAD stent placed in Dec 2000. This has been patent by restudy in '02 and Jan 2013. She does have some residual Dx disease treated medically.   She is in the office today because of complaints of chest pain and dyspnea. We started Ranexa and does appear to lead to improvement in her symptoms, but after about a week she developed severe constipation and required large amounts of laxatives. She has stopped next. The medication did appear to alleviate her symptoms. She has only had one episode of angina since she was here last month. In the week status past and she stopped Ranexa she has not had recurrent chest pain. She does not think she can take this medication long-term because of its side effects.  She had a LexiScan Myoview performed recently which showed a relatively small anterior wall reversible defect. This corresponds quite well with a known stenosis in the second diagonal artery that was left for medical therapy after her last cath. Left ventricular systolic function is normal. She states that the discomfort she felt in her chest during a LexiScan study was identical with her clinical complaints of left-sided chest pain.     Allergies  Allergen Reactions  . Atorvastatin Anaphylaxis    Rxn unknown  . Latex     Rxn is blisters  . Shellfish Allergy Nausea And Vomiting  . Ciprofloxacin Hives  . Codeine Nausea And Vomiting  . Contrast Media (Iodinated Diagnostic Agents)     Flushing  . Gelnique (Oxybutynin)     Rxn is blisters  . Penicillins Hives    Current Outpatient Prescriptions  Medication Sig Dispense Refill  . aspirin EC 81 MG tablet Take 1 tablet (81 mg total) by mouth daily.  90 tablet  3  . Co-Enzyme Q-10 100 MG CAPS Take 1 capsule by mouth daily.        . Cranberry 500 MG CAPS Take 1 capsule by  mouth daily.        Marland Kitchen docusate sodium (COLACE) 50 MG capsule Take by mouth 2 (two) times daily.      Marland Kitchen estradiol (ESTRACE) 0.1 MG/GM vaginal cream Place 2 g vaginally daily as needed. For urinary tract infection       . fenofibrate 160 MG tablet Take 160 mg by mouth daily.        . furosemide (LASIX) 20 MG tablet Take 20 mg by mouth daily as needed.      . isosorbide mononitrate (IMDUR) 30 MG 24 hr tablet Take 1 tablet (30 mg total) by mouth daily.  30 tablet  10  . levothyroxine (SYNTHROID, LEVOTHROID) 125 MCG tablet Take 125 mcg by mouth daily before breakfast.      . losartan-hydrochlorothiazide (HYZAAR) 100-25 MG per tablet Take 1 tablet by mouth daily.        . Magnesium 250 MG TABS Take 1 tablet by mouth daily.        . Multiple Vitamins-Minerals (CENTRUM SILVER ADULT 50+) TABS Take by mouth.      . Multiple Vitamins-Minerals (ICAPS MV PO) Take by mouth.      . nitroGLYCERIN (NITROSTAT) 0.4 MG SL tablet Place 1 tablet (0.4 mg total) under the tongue every 5 (five) minutes as needed for chest pain.  25 tablet  2  .  potassium chloride (K-DUR) 10 MEQ tablet Take 10 mEq by mouth as needed.      Marland Kitchen POTASSIUM GLUCONATE PO Take 1 tablet by mouth daily.        . pravastatin (PRAVACHOL) 20 MG tablet Take 20 mg by mouth daily.        Marland Kitchen pyridoxine (B-6) 100 MG tablet Take 100 mg by mouth daily.      . vitamin B-12 (CYANOCOBALAMIN) 1000 MCG tablet Take 2,500 mcg by mouth daily.       . vitamin C (ASCORBIC ACID) 250 MG tablet Take 1,000 mg by mouth daily.       . ranolazine (RANEXA) 1000 MG SR tablet Take 1 tablet (1,000 mg total) by mouth 2 (two) times daily.  42 tablet  0   No current facility-administered medications for this visit.    Past Medical History  Diagnosis Date  . SOB (shortness of breath)     2D ECHO, 04/27/2010 - EF >55%, normal  . Chest pain, atypical     LEXISCAN, 08/29/2011 - anterior wall defect, partially reversible-suggestive of artifact and mild ischemia in LAD, post stress  EF 68%, no ECG changes  . Edema of lower extremity     LEA VENOUS, 10/17/2011 - mild reflux in bilateral common femoral veins    Past Surgical History  Procedure Laterality Date  . Cardiac catheterization Left 09/25/2011    Medical management  . Cardiac catheterization Left 03/25/2001    Normal LV function, LAD residual narrowing of less than 10%, normal ramus intermediate, circumflex, and RCA,   . Cardiac catheterization  09/04/1999    LAD, 3x55mm Tetra stent resulting in a reduction of the 80% stenosis to 0% residual    Family History  Problem Relation Age of Onset  . Cancer Mother   . Heart disease Father   . Heart disease Sister   . Heart disease Brother     History   Social History  . Marital Status: Married    Spouse Name: N/A    Number of Children: N/A  . Years of Education: N/A   Occupational History  . Not on file.   Social History Main Topics  . Smoking status: Never Smoker   . Smokeless tobacco: Not on file  . Alcohol Use: No  . Drug Use: No  . Sexually Active: Not on file   Other Topics Concern  . Not on file   Social History Narrative  . No narrative on file    Review of systems: The patient specifically denies any  orthopnea, paroxysmal nocturnal dyspnea, syncope, palpitations, focal neurological deficits, intermittent claudication, lower extremity edema, unexplained weight gain, cough, hemoptysis or wheezing.  The patient also denies abdominal pain, nausea, vomiting, dysphagia, diarrhea, constipation, polyuria, polydipsia, dysuria, hematuria, frequency, urgency, abnormal bleeding or bruising, fever, chills, unexpected weight changes, mood swings, change in skin or hair texture, change in voice quality, auditory or visual problems, allergic reactions or rashes.  She has chronic back pain and recently has required repeated epidural injections  PHYSICAL EXAM BP 116/62  Pulse 68  Ht 5' 5.5" (1.664 m)  Wt 218 lb 3.2 oz (98.975 kg)  BMI 35.75  kg/m2 General: Alert, oriented x3, no distress  Head: no evidence of trauma, PERRL, EOMI, no exophtalmos or lid lag, no myxedema, no xanthelasma; normal ears, nose and oropharynx  Neck: normal jugular venous pulsations and no hepatojugular reflux; brisk carotid pulses without delay and no carotid bruits  Chest: clear to auscultation, no signs  of consolidation by percussion or palpation, normal fremitus, symmetrical and full respiratory excursions  Cardiovascular: normal position and quality of the apical impulse, regular rhythm, normal first and second heart sounds, no murmurs, rubs or gallops  Abdomen: no tenderness or distention, no masses by palpation, no abnormal pulsatility or arterial bruits, normal bowel sounds, no hepatosplenomegaly  Extremities: no clubbing, cyanosis or edema; 2+ radial, ulnar and brachial pulses bilaterally; 2+ right femoral, posterior tibial and dorsalis pedis pulses; 2+ left femoral, posterior tibial and dorsalis pedis pulses; no subclavian or femoral bruits  Neurological: grossly nonfocal   BMET    Component Value Date/Time   NA 141 07/24/2011 0620   K 3.8 07/24/2011 0620   CL 104 07/24/2011 0620   CO2 28 07/24/2011 0620   GLUCOSE 97 07/24/2011 0620   BUN 11 07/24/2011 0620   CREATININE 1.77* 07/24/2011 0620   CALCIUM 9.7 07/24/2011 0620   GFRNONAA 27* 07/24/2011 0620   GFRAA 31* 07/24/2011 0620     ASSESSMENT AND PLAN CAD LAD stent 12/00. Patent '02 and 1/13 Ms. Takeshita has mild angina pectoris that is usually exertional. Unfortunately there is little room to adjust her antianginal is due to low blood pressure. Ranexa seemed to work but was associated with intolerable constipation. The symptoms at this point are mild and her recent nuclear stress test is reassuring. I think we will try to avoid invasive coronary angiography, especially knowing that the diagonal artery stenosis was not felt amenable to PCI. She will increase the isosorbide mononitrate to 60 mg a day.  She would probably do best if she takes the entire amount early in the morning, but if she has nocturnal angina (as she sometimes does) she may choose to split it into twice daily administration.   Orders Placed This Encounter  Procedures  . Lipid Profile  . Comp Met (CMET)   Meds ordered this encounter  Medications  . isosorbide mononitrate (IMDUR) 30 MG 24 hr tablet    Sig: Take 1 tablet (30 mg total) by mouth 2 (two) times daily.    Dispense:  60 tablet    Refill:  12    Order Specific Question:  Supervising Provider    Answer:  Runell Gess [[id number]]    Junious Silk, MD, [facility] [phone] office [phone] pager

## 2013-04-18 LAB — COMPREHENSIVE METABOLIC PANEL
ALT: 20 U/L (ref 0–35)
AST: 30 U/L (ref 0–37)
Albumin: 4 g/dL (ref 3.5–5.2)
Alkaline Phosphatase: 28 U/L — ABNORMAL LOW (ref 39–117)
BUN: 17 mg/dL (ref 6–23)
CO2: 28 mEq/L (ref 19–32)
Calcium: 9.9 mg/dL (ref 8.4–10.5)
Chloride: 103 mEq/L (ref 96–112)
Creat: 1.4 mg/dL — ABNORMAL HIGH (ref 0.50–1.10)
Glucose, Bld: 125 mg/dL — ABNORMAL HIGH (ref 70–99)
Potassium: 3.9 mEq/L (ref 3.5–5.3)
Sodium: 140 mEq/L (ref 135–145)
Total Bilirubin: 0.8 mg/dL (ref 0.3–1.2)
Total Protein: 6.1 g/dL (ref 6.0–8.3)

## 2013-04-18 LAB — LIPID PANEL
Cholesterol: 162 mg/dL (ref 0–200)
HDL: 42 mg/dL (ref >39)
LDL Cholesterol: 92 mg/dL (ref 0–99)
Total CHOL/HDL Ratio: 3.9 Ratio
Triglycerides: 140 mg/dL (ref <150)
VLDL: 28 mg/dL (ref 0–40)

## 2013-04-27 ENCOUNTER — Telehealth: Payer: Self-pay | Admitting: Cardiovascular Disease

## 2013-04-27 ENCOUNTER — Other Ambulatory Visit: Payer: Self-pay | Admitting: Neurosurgery

## 2013-04-27 NOTE — Telephone Encounter (Signed)
Pt is experiencing pain in your left side of chest that lasted about 2 minutes, I

## 2013-04-27 NOTE — Telephone Encounter (Signed)
Pt is calling stating that she is having some chest pain that last approx. 2 min or so. She was offered an earlier appointment but wanted to talk to someone regarding this before she decides whether or not she needs to move her appointment.

## 2013-04-27 NOTE — Telephone Encounter (Signed)
Pt had an episode of chest pain yesterday while in the choir at church.  It was associated with a headache that went from the back of her head down into her neck.  It lasted approx 2 min.  She did not take NTG. It upset her to the point that she cried.  She said that the pain was stronger than normal.  Today, she is feeling fatigued and weak, but no more episodes of chest pain since yesterday.    Dr C...please advise

## 2013-04-27 NOTE — Telephone Encounter (Signed)
She has a known the diagonal artery stenosis and a corresponding small perfusion defect on her nuclear study. I think it is possible she'll continue to have occasional brief angina pectoris episodes. If she has episodes that last more than 20 minutes, she should immediately seek medical assistance in the emergency room. Otherwise continue with the same plan of care.

## 2013-04-27 NOTE — Telephone Encounter (Signed)
Patient advised of treatment plan and verbalized understanding

## 2013-04-28 ENCOUNTER — Other Ambulatory Visit: Payer: Self-pay | Admitting: Neurosurgery

## 2013-04-28 DIAGNOSIS — M5412 Radiculopathy, cervical region: Secondary | ICD-10-CM

## 2013-05-07 ENCOUNTER — Ambulatory Visit (INDEPENDENT_AMBULATORY_CARE_PROVIDER_SITE_OTHER): Payer: Medicare Other | Admitting: Physician Assistant

## 2013-05-07 ENCOUNTER — Encounter: Payer: Self-pay | Admitting: Physician Assistant

## 2013-05-07 VITALS — BP 148/62 | HR 61 | Ht 65.5 in | Wt 216.3 lb

## 2013-05-07 DIAGNOSIS — I2510 Atherosclerotic heart disease of native coronary artery without angina pectoris: Secondary | ICD-10-CM

## 2013-05-07 DIAGNOSIS — R079 Chest pain, unspecified: Secondary | ICD-10-CM

## 2013-05-07 MED ORDER — ISOSORBIDE MONONITRATE ER 30 MG PO TB24: 60.0000 mg | ORAL_TABLET | Freq: Two times a day (BID) | ORAL | Status: DC

## 2013-05-07 NOTE — Assessment & Plan Note (Signed)
Patient continues to have intermittent chest pain. She's had 3 episodes since Sunday although last less than a minute duration. She typically just sitting. Maximum intensity is been 6/10. Going to increase her Imdur to 60 mg twice daily from 30 mg twice daily. She's taken Ranexa in the past but has made her extremely constipated and that had been discontinued I did talk to her briefly about restarting at a lower dose. We may need to consider that in the future

## 2013-05-07 NOTE — Patient Instructions (Signed)
Start taking Imdur 60 mg twice daily.  Follow up with Dr. Salena Saner in three months.

## 2013-05-07 NOTE — Progress Notes (Signed)
Date:  05/07/2013   ID:  Jill Preston, DOB 04/19/1933, MRN [id number]  PCP:  Alva Garnet., MD  Primary Cardiologist:  [facility]    History of Present Illness: Jill Preston is a 79 y.o. female with a history of CAD. She had an LAD stent placed in Dec 2000. This has been patent by restudy in '02 and Jan 2013. She does have some residual Dx disease treated medically.  Her last nuclear stress test was June 2014 and showed intermediate risk with reversible anterior ischemia which was previously identified to be in the diagonal is occluded. Overall however her she is no wall motion abnormalities and ejection fraction 69%.  These results are similar to spelled Lexiscan completed December 2012  Presents today for followup evaluation. She has reported 3 episodes of chest pain since Sunday all less than 1 minute in duration.  Strong as intense as it was 6/10. She has not taken any sublingual nitroglycerin they all have resolved on their own. She also reports some morning nausea and occasional shortness of breath neither of which coincide with her chest pain. She does take Lasix as needed but does complain of orthopnea and occasional PND.  The patient currently denies vomiting, fever,  Dizziness, cough, congestion, abdominal pain, hematochezia, melena, lower extremity edema, claudication.  Wt Readings from Last 3 Encounters:  05/07/13 216 lb 4.8 oz (98.113 kg)  04/08/13 218 lb 3.2 oz (98.975 kg)  03/11/13 219 lb 12.8 oz (99.701 kg)     Past Medical History  Diagnosis Date  . SOB (shortness of breath)     2D ECHO, 04/27/2010 - EF >55%, normal  . Chest pain, atypical     LEXISCAN, 08/29/2011 - anterior wall defect, partially reversible-suggestive of artifact and mild ischemia in LAD, post stress EF 68%, no ECG changes  . Edema of lower extremity     LEA VENOUS, 10/17/2011 - mild reflux in bilateral common femoral veins    Current Outpatient Prescriptions  Medication Sig Dispense  Refill  . aspirin EC 81 MG tablet Take 1 tablet (81 mg total) by mouth daily.  90 tablet  3  . Co-Enzyme Q-10 100 MG CAPS Take 1 capsule by mouth daily.        . Cranberry 500 MG CAPS Take 1 capsule by mouth daily.        Marland Kitchen docusate sodium (COLACE) 50 MG capsule Take by mouth 2 (two) times daily.      Marland Kitchen estradiol (ESTRACE) 0.1 MG/GM vaginal cream Place 2 g vaginally daily as needed. For urinary tract infection       . fenofibrate 160 MG tablet Take 160 mg by mouth daily.        . furosemide (LASIX) 20 MG tablet Take 20 mg by mouth daily as needed.      . isosorbide mononitrate (IMDUR) 30 MG 24 hr tablet Take 2 tablets (60 mg total) by mouth 2 (two) times daily.  60 tablet  12  . levothyroxine (SYNTHROID, LEVOTHROID) 125 MCG tablet Take 125 mcg by mouth daily before breakfast.      . losartan-hydrochlorothiazide (HYZAAR) 100-25 MG per tablet Take 1 tablet by mouth daily.        . Magnesium 250 MG TABS Take 1 tablet by mouth daily.        . Multiple Vitamins-Minerals (CENTRUM SILVER ADULT 50+) TABS Take by mouth.      . nitroGLYCERIN (NITROSTAT) 0.4 MG SL tablet Place 1 tablet (0.4 mg total)  under the tongue every 5 (five) minutes as needed for chest pain.  25 tablet  2  . potassium chloride (K-DUR) 10 MEQ tablet Take 10 mEq by mouth as needed.      . pravastatin (PRAVACHOL) 20 MG tablet Take 20 mg by mouth daily.        Marland Kitchen pyridoxine (B-6) 100 MG tablet Take 100 mg by mouth daily.      . vitamin B-12 (CYANOCOBALAMIN) 1000 MCG tablet Take 2,500 mcg by mouth daily.       . vitamin C (ASCORBIC ACID) 250 MG tablet Take 1,000 mg by mouth daily.       Marland Kitchen POTASSIUM GLUCONATE PO Take 1 tablet by mouth daily.         No current facility-administered medications for this visit.    Allergies:    Allergies  Allergen Reactions  . Atorvastatin Anaphylaxis    Rxn unknown  . Latex     Rxn is blisters  . Shellfish Allergy Nausea And Vomiting  . Ciprofloxacin Hives  . Codeine Nausea And Vomiting  .  Contrast Media [Iodinated Diagnostic Agents]     Flushing  . Gelnique [Oxybutynin]     Rxn is blisters  . Penicillins Hives    Social History:  The patient  reports that she has never smoked. She does not have any smokeless tobacco history on file. She reports that she does not drink alcohol or use illicit drugs.   Family history:   Family History  Problem Relation Age of Onset  . Cancer Mother   . Heart disease Father   . Heart disease Sister   . Heart disease Brother     ROS:  Please see the history of present illness.  All other systems reviewed and negative.   PHYSICAL EXAM: VS:  BP 148/62  Pulse 61  Ht 5' 5.5" (1.664 m)  Wt 216 lb 4.8 oz (98.113 kg)  BMI 35.43 kg/m2 Obese, well developed, in no acute distress HEENT: Pupils are equal round react to light accommodation extraocular movements are intact.  Neck: no JVDNo cervical lymphadenopathy. Cardiac: Regular rate and rhythm without murmurs rubs or gallops. Lungs:  clear to auscultation bilaterally, no wheezing, rhonchi or rales Abd: soft, nontender, positive bowel sounds all quadrants, no hepatosplenomegaly Ext: no lower extremity edema.  2+ radial and dorsalis pedis pulses. Skin: warm and dry Neuro:  Grossly normal  EKG:   Normal sinus rhythm T-wave inversions in V1 and 2 aVL similar to prior rate 61 beats per minute  ASSESSMENT AND PLAN:  Problem List Items Addressed This Visit   Chest pain - Primary     Patient continues to have intermittent chest pain. She's had 3 episodes since Sunday although last less than a minute duration. She typically just sitting. Maximum intensity is been 6/10. Going to increase her Imdur to 60 mg twice daily from 30 mg twice daily. She's taken Ranexa in the past but has made her extremely constipated and that had been discontinued I did talk to her briefly about restarting at a lower dose. We may need to consider that in the future    Relevant Orders      EKG 12-Lead   CAD LAD stent  12/00. Patent '02 and 1/13 (Chronic)   Relevant Medications      isosorbide mononitrate (IMDUR) 24 hr tablet

## 2013-06-24 ENCOUNTER — Ambulatory Visit (INDEPENDENT_AMBULATORY_CARE_PROVIDER_SITE_OTHER): Payer: BLUE CROSS/BLUE SHIELD | Admitting: Podiatry

## 2013-06-24 ENCOUNTER — Encounter: Payer: Self-pay | Admitting: Podiatry

## 2013-06-24 VITALS — BP 133/71 | HR 68 | Ht 65.0 in | Wt 215.0 lb

## 2013-06-24 DIAGNOSIS — M21969 Unspecified acquired deformity of unspecified lower leg: Secondary | ICD-10-CM

## 2013-06-24 DIAGNOSIS — M25571 Pain in right ankle and joints of right foot: Secondary | ICD-10-CM

## 2013-06-24 DIAGNOSIS — M79609 Pain in unspecified limb: Secondary | ICD-10-CM

## 2013-06-24 DIAGNOSIS — M79671 Pain in right foot: Secondary | ICD-10-CM | POA: Insufficient documentation

## 2013-06-24 DIAGNOSIS — M25579 Pain in unspecified ankle and joints of unspecified foot: Secondary | ICD-10-CM

## 2013-06-24 MED ORDER — ARCH BANDAGE MISC: 1.0000 | Freq: Once | Status: DC

## 2013-06-24 NOTE — Patient Instructions (Signed)
Seen for right foot pain and swelling. Findings reveal possible torn ligament at the base of the 2nd metatarsal bone. If conservative approach fails improve, the area may need be fused surgically. At this time it is recommended to decrease weight bearing activity and wear Metatarsal binder with ankle brace. Return next week to evaluate Metatarsal binder.

## 2013-06-24 NOTE — Progress Notes (Signed)
Subjective: 79 y.o. year old female patient presents complaining of pain on top of right foot that extends up to ankle and to front of right lower leg for duration of a year. Patient points the center top of the right foot being the source of pain and swelling. Stated that she is very active and on feet constantly caring for grandchildren and sewing for cancer patients.   Review of Systems - General ROS: negative for - chills, fatigue, hot flashes, night sweats or sleep disturbance Ophthalmic ROS: Eyes are getting weaker. She will have them checked soon. ENT ROS: negative Allergy and Immunology ROS: Allertic to shell fish strawberry, eggs, and greenbeans. Breast ROS: negative for breast lumps Respiratory ROS: no cough, shortness of breath, or wheezing Cardiovascular ROS: Being monitored for a small blocked artery in heart.  Gastrointestinal ROS: no abdominal pain, change in bowel habits, or black or bloody stools Musculoskeletal ROS: Only in legs and foot. Neurological ROS: no TIA or stroke symptoms Dermatological ROS: negative.  Objective: Dermatologic: Normal findings.  Vascular: Pedal pulses are all palpable. Orthopedic: Forefoot varus with elevated first ray bilateral. Pain upon direct pressure and range of motion moving the mid foot at lisfranc's joint near 2nd Metatarsal cuneiform joint right. Neurologic: All epicritic and tactile sensations grossly intact. Some decreased response to Monofilament sensory testing bilateral. Normal vibratory sensation test. Decreased DTR reaction on left knee, all others, knee, ankle DTR is normal.  X-ray: Status post resected medial eminence of the first metatarsal bone bilateral. Long 2nd metatarsal bone right foot, short and flat metatarsal head on 2nd left foot in AP view. Elevated first ray bilateral in lateral view. Increased joint space at the articulation between medial aspect of the 2nd metatarsal base and the distal lateral surface of the  first cuneiform joint.  Positive of excess bone spur at the plantar and posterior aspect of the calcaneus bilateral on latera view.  There is more lateral deviation of the Calcaneocuboid lateral deviation angle on right more than the left in AP view.   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  Focalized Lisfranc joint injury at medial base of the 2nd Metatarsal and the lateral surface of the first socuneiform joint right foot. Forefoot varus and elevated first ray leading excess load to the 2nd ray and at Lisfranc joint right.   Treatment: Reviewed clinical findings and available treatment options. May benefit from midtarsal joint support and compression using Metatarsal binder.  May benefit from changing shoe gear to lace up Tennis shoes (patient has problem wearing closed in shoes, because of burning feeling with them). Patient is to return with her foot wrap for evaluation. Discussed possible surgical fusion of the injured site if the conservative treatment fail to improve the right foot condition.

## 2013-07-01 ENCOUNTER — Encounter: Payer: Self-pay | Admitting: Podiatry

## 2013-07-01 ENCOUNTER — Telehealth: Payer: Self-pay | Admitting: *Deleted

## 2013-07-01 ENCOUNTER — Encounter: Payer: Self-pay | Admitting: Gastroenterology

## 2013-07-01 ENCOUNTER — Ambulatory Visit (INDEPENDENT_AMBULATORY_CARE_PROVIDER_SITE_OTHER): Payer: BLUE CROSS/BLUE SHIELD | Admitting: Podiatry

## 2013-07-01 VITALS — BP 136/78 | HR 67 | Ht 65.0 in | Wt 210.0 lb

## 2013-07-01 DIAGNOSIS — M21969 Unspecified acquired deformity of unspecified lower leg: Secondary | ICD-10-CM

## 2013-07-01 DIAGNOSIS — M25579 Pain in unspecified ankle and joints of unspecified foot: Secondary | ICD-10-CM

## 2013-07-01 DIAGNOSIS — M21961 Unspecified acquired deformity of right lower leg: Secondary | ICD-10-CM

## 2013-07-01 DIAGNOSIS — M25571 Pain in right ankle and joints of right foot: Secondary | ICD-10-CM

## 2013-07-01 MED ORDER — DICLOFENAC EPOLAMINE 1.3 % TD PTCH: 1.0000 | MEDICATED_PATCH | Freq: Two times a day (BID) | TRANSDERMAL | Status: DC

## 2013-07-01 NOTE — Telephone Encounter (Signed)
Dr. Raynald Kemp, [facility] called and said the Rx for Patches were 95.00(15) She didn't get it filled, saying she might as well do the surgery. She wanted me to let you know.

## 2013-07-01 NOTE — Progress Notes (Signed)
Right foot pain on dorsum. Patient stated that Metatarsal binder and ankle brace is not helping.  Patient is seeking other alternative options such as surgery. Assessment: Previously noted for Arthropathy of Lisfranc joint right at 2nd MCJ.  Plan: Will try topical cream and patches at this time.  If this fails then may discuss other options.  Try the topical and patches for 3 weeks and return.

## 2013-07-01 NOTE — Patient Instructions (Signed)
Problem with right foot pain. Metatarsal pad, and other modality failed to improve. Will try compounding cream and patch. Return in 3 weeks.

## 2013-07-19 ENCOUNTER — Emergency Department (HOSPITAL_COMMUNITY)
Admission: EM | Admit: 2013-07-19 | Discharge: 2013-07-19 | Disposition: A | Discharge status: Home / Self Care | Payer: Medicare Other | Attending: Emergency Medicine | Admitting: Emergency Medicine

## 2013-07-19 ENCOUNTER — Encounter (HOSPITAL_COMMUNITY): Payer: Self-pay | Admitting: Emergency Medicine

## 2013-07-19 ENCOUNTER — Emergency Department (HOSPITAL_COMMUNITY): Payer: Medicare Other

## 2013-07-19 DIAGNOSIS — Y9389 Activity, other specified: Secondary | ICD-10-CM | POA: Insufficient documentation

## 2013-07-19 DIAGNOSIS — W19XXXA Unspecified fall, initial encounter: Secondary | ICD-10-CM

## 2013-07-19 DIAGNOSIS — Z79899 Other long term (current) drug therapy: Secondary | ICD-10-CM | POA: Insufficient documentation

## 2013-07-19 DIAGNOSIS — Z91040 Latex allergy status: Secondary | ICD-10-CM | POA: Insufficient documentation

## 2013-07-19 DIAGNOSIS — Y92009 Unspecified place in unspecified non-institutional (private) residence as the place of occurrence of the external cause: Secondary | ICD-10-CM | POA: Insufficient documentation

## 2013-07-19 DIAGNOSIS — W1809XA Striking against other object with subsequent fall, initial encounter: Secondary | ICD-10-CM | POA: Insufficient documentation

## 2013-07-19 DIAGNOSIS — IMO0002 Reserved for concepts with insufficient information to code with codable children: Secondary | ICD-10-CM | POA: Insufficient documentation

## 2013-07-19 DIAGNOSIS — Z7982 Long term (current) use of aspirin: Secondary | ICD-10-CM | POA: Insufficient documentation

## 2013-07-19 DIAGNOSIS — R32 Unspecified urinary incontinence: Secondary | ICD-10-CM | POA: Insufficient documentation

## 2013-07-19 DIAGNOSIS — Z880 Allergy status to penicillin: Secondary | ICD-10-CM | POA: Insufficient documentation

## 2013-07-19 MED ORDER — HYDROCODONE-ACETAMINOPHEN 5-325 MG PO TABS: 1.0000 | ORAL_TABLET | Freq: Four times a day (QID) | ORAL | Status: DC | PRN

## 2013-07-19 NOTE — Discharge Instructions (Signed)
Contusion X-rays show no broken bones. You do have arthritis in your back. Take Tylenol for mild pain or the pain medicine prescribed for bad pain. See Dr. Karlton Lemon if you have significant pain in 4 or 5 days. Consider getting a cane if you feel unsteady on her feet A contusion is a deep bruise. Contusions are the result of an injury that caused bleeding under the skin. The contusion may turn blue, purple, or yellow. Minor injuries will give you a painless contusion, but more severe contusions may stay painful and swollen for a few weeks.  CAUSES  A contusion is usually caused by a blow, trauma, or direct force to an area of the body. SYMPTOMS   Swelling and redness of the injured area.  Bruising of the injured area.  Tenderness and soreness of the injured area.  Pain. DIAGNOSIS  The diagnosis can be made by taking a history and physical exam. An X-ray, CT scan, or MRI may be needed to determine if there were any associated injuries, such as fractures. TREATMENT  Specific treatment will depend on what area of the body was injured. In general, the best treatment for a contusion is resting, icing, elevating, and applying cold compresses to the injured area. Over-the-counter medicines may also be recommended for pain control. Ask your caregiver what the best treatment is for your contusion. HOME CARE INSTRUCTIONS   Put ice on the injured area.  Put ice in a plastic bag.  Place a towel between your skin and the bag.  Leave the ice on for 15-20 minutes, 3-4 times a day.  Only take over-the-counter or prescription medicines for pain, discomfort, or fever as directed by your caregiver. Your caregiver may recommend avoiding anti-inflammatory medicines (aspirin, ibuprofen, and naproxen) for 48 hours because these medicines may increase bruising.  Rest the injured area.  If possible, elevate the injured area to reduce swelling. SEEK IMMEDIATE MEDICAL CARE IF:   You have increased bruising or  swelling.  You have pain that is getting worse.  Your swelling or pain is not relieved with medicines. MAKE SURE YOU:   Understand these instructions.  Will watch your condition.  Will get help right away if you are not doing well or get worse. Document Released: 06/13/2005 Document Revised: 11/26/2011 Document Reviewed: 07/09/2011 [facility] Patient Information 2014 [place], [place].

## 2013-07-19 NOTE — ED Notes (Signed)
Pt from home reports that she bent down to pickup something from floor, lost her balance and fell onto her bottom, hitting her back on a chair 2 days ago. Pt has bruising to her lower spine. Pt also c/o R leg and hip pain since fall. Pt has been taking ibuprofen with no relief. Pt is A&O and in NAD, has no other c/o.

## 2013-07-19 NOTE — ED Provider Notes (Signed)
CSN: [id number]     Arrival date & time 07/19/13  0920 History   First MD Initiated Contact with Patient 07/19/13 [phone]     Chief Complaint  Patient presents with  . Back Pain  . Fall   (Consider location/radiation/quality/duration/timing/severity/associated sxs/prior Treatment) HPI Patient slipped and fell 2 days ago when she is bending over she landed on her buttocks and struck her back against a chair. She complains of low back pain, nonradiating since the event. She denies hip or leg pain. He has been in [employer] since the event. Pain is worse with moving or walking. Improved with remaining still. She's been treated herself with ibuprofen with partial relief. Patient suffers from chronic urinary incontinence. She's had no loss of bowel control. Past Medical History  Diagnosis Date  . SOB (shortness of breath)     2D ECHO, 04/27/2010 - EF >55%, normal  . Chest pain, atypical     LEXISCAN, 08/29/2011 - anterior wall defect, partially reversible-suggestive of artifact and mild ischemia in LAD, post stress EF 68%, no ECG changes  . Edema of lower extremity     LEA VENOUS, 10/17/2011 - mild reflux in bilateral common femoral veins   Past Surgical History  Procedure Laterality Date  . Cardiac catheterization Left 09/25/2011    Medical management  . Cardiac catheterization Left 03/25/2001    Normal LV function, LAD residual narrowing of less than 10%, normal ramus intermediate, circumflex, and RCA,   . Cardiac catheterization  09/04/1999    LAD, 3x47mm Tetra stent resulting in a reduction of the 80% stenosis to 0% residual   Family History  Problem Relation Age of Onset  . Cancer Mother   . Heart disease Father   . Heart disease Sister   . Heart disease Brother    History  Substance Use Topics  . Smoking status: Never Smoker   . Smokeless tobacco: Never Used  . Alcohol Use: No   OB History   Grav Para Term Preterm Abortions TAB SAB Ect Mult Living                 Review of Systems   Constitutional: Negative.   HENT: Negative.   Respiratory: Negative.   Cardiovascular: Negative.   Gastrointestinal: Negative.   Genitourinary:       Chronic urinary incontinence  Musculoskeletal: Positive for back pain.  Skin: Negative.   Neurological: Negative.   Psychiatric/Behavioral: Negative.   All other systems reviewed and are negative.    Allergies  Atorvastatin; Latex; Shellfish allergy; Ciprofloxacin; Codeine; Contrast media; Gelnique; and Penicillins  Home Medications   Current Outpatient Rx  Name  Route  Sig  Dispense  Refill  . aspirin EC 81 MG tablet   Oral   Take 1 tablet (81 mg total) by mouth daily.   90 tablet   3   . Co-Enzyme Q-10 100 MG CAPS   Oral   Take 1 capsule by mouth daily.           . Cranberry 500 MG CAPS   Oral   Take 1 capsule by mouth daily.           . diclofenac (FLECTOR) 1.3 % PTCH   Transdermal   Place 1 patch onto the skin 2 (two) times daily.   15 patch   0   . docusate sodium (COLACE) 50 MG capsule   Oral   Take by mouth 2 (two) times daily.         Marland Kitchen  estradiol (ESTRACE) 0.1 MG/GM vaginal cream   Vaginal   Place 2 g vaginally daily as needed. For urinary tract infection          . fenofibrate 160 MG tablet   Oral   Take 160 mg by mouth daily.           . furosemide (LASIX) 20 MG tablet   Oral   Take 20 mg by mouth daily as needed.         . isosorbide mononitrate (IMDUR) 30 MG 24 hr tablet   Oral   Take 2 tablets (60 mg total) by mouth 2 (two) times daily.   60 tablet   12   . levothyroxine (SYNTHROID, LEVOTHROID) 125 MCG tablet   Oral   Take 125 mcg by mouth daily before breakfast.         . losartan-hydrochlorothiazide (HYZAAR) 100-25 MG per tablet   Oral   Take 1 tablet by mouth daily.           . Magnesium 250 MG TABS   Oral   Take 1 tablet by mouth daily.           . Multiple Vitamins-Minerals (CENTRUM SILVER ADULT 50+) TABS   Oral   Take by mouth.         .  nitroGLYCERIN (NITROSTAT) 0.4 MG SL tablet   Sublingual   Place 1 tablet (0.4 mg total) under the tongue every 5 (five) minutes as needed for chest pain.   25 tablet   2   . potassium chloride (K-DUR) 10 MEQ tablet   Oral   Take 10 mEq by mouth as needed.         Marland Kitchen POTASSIUM GLUCONATE PO   Oral   Take 1 tablet by mouth daily.           . pravastatin (PRAVACHOL) 20 MG tablet   Oral   Take 20 mg by mouth daily.           Marland Kitchen pyridoxine (B-6) 100 MG tablet   Oral   Take 100 mg by mouth daily.         . vitamin B-12 (CYANOCOBALAMIN) 1000 MCG tablet   Oral   Take 2,500 mcg by mouth daily.          . vitamin C (ASCORBIC ACID) 250 MG tablet   Oral   Take 1,000 mg by mouth daily.           BP 139/76  Pulse 66  Temp(Src) 98.1 F (36.7 C) (Oral)  Resp 16  SpO2 93% Physical Exam  Nursing note and vitals reviewed. Constitutional: She is oriented to person, place, and time. She appears well-developed and well-nourished.  HENT:  Head: Normocephalic and atraumatic.  Eyes: Conjunctivae are normal. Pupils are equal, round, and reactive to light.  Neck: Neck supple. No tracheal deviation present. No thyromegaly present.  Cardiovascular: Normal rate and regular rhythm.   No murmur heard. Pulmonary/Chest: Effort normal and breath sounds normal.  Abdominal: Soft. Bowel sounds are normal. She exhibits no distension. There is no tenderness.  Musculoskeletal: Normal range of motion. She exhibits no edema and no tenderness.  Ecchymotic along the lumbar spine Mild lumbar tenderness. No cervical tenderness no thoracic tenderness. Stable nontender. All 4 extremities up tenderness neurovascularly intact. No pain on internal rotation of either thigh.  Neurological: She is alert and oriented to person, place, and time. She has normal reflexes. Coordination normal.  DTRs symmetric bilaterally and ankle jerk  knee jerk toes downgoing bilaterally. Gait normal  Skin: Skin is warm and dry.  No rash noted.  Psychiatric: She has a normal mood and affect.    ED Course  Procedures (including critical care time) Labs Review Labs Reviewed - No data to display Imaging Review No results found.  EKG Interpretation   None      declines pain medicine in ED X-rays viewed by me Results for orders placed in visit on 04/08/13  LIPID PANEL      Result Value Range   Cholesterol 162  0 - 200 mg/dL   Triglycerides 140  <150 mg/dL   HDL 42  >39 mg/dL   Total CHOL/HDL Ratio 3.9     VLDL 28  0 - 40 mg/dL   LDL Cholesterol 92  0 - 99 mg/dL  COMPREHENSIVE METABOLIC PANEL      Result Value Range   Sodium 140  135 - 145 mEq/L   Potassium 3.9  3.5 - 5.3 mEq/L   Chloride 103  96 - 112 mEq/L   CO2 28  19 - 32 mEq/L   Glucose, Bld 125 (*) 70 - 99 mg/dL   BUN 17  6 - 23 mg/dL   Creat 1.40 (*) 0.50 - 1.10 mg/dL   Total Bilirubin 0.8  0.3 - 1.2 mg/dL   Alkaline Phosphatase 28 (*) 39 - 117 U/L   AST 30  0 - 37 U/L   ALT 20  0 - 35 U/L   Total Protein 6.1  6.0 - 8.3 g/dL   Albumin 4.0  3.5 - 5.2 g/dL   Calcium 9.9  8.4 - 10.5 mg/dL   Dg Lumbar Spine Complete  07/19/2013   CLINICAL DATA:  Status post fall. Lower back pain which radiates into the right hip.  EXAM: LUMBAR SPINE - COMPLETE 4+ VIEW  COMPARISON:  11/10/2012  FINDINGS: Degenerative changes are seen throughout the lumbar spine. There there is no acute fracture or subluxation. No suspicious lytic or blastic lesions are identified. Regional bowel gas pattern is nonobstructive. Atherosclerotic calcification is seen in the aorta. Surgical clips are seen in the right upper quadrant of the abdomen.  IMPRESSION: 1. Degenerative changes. 2.  No evidence for acute  abnormality.   Electronically Signed   By: Shon Hale M.D.   On: 07/19/2013 10:57   Dg Hip Complete Right  07/19/2013   CLINICAL DATA:  Status post fall. Lower back pain radiating to the right hip.  EXAM: RIGHT HIP - COMPLETE 2+ VIEW  COMPARISON:  None.  FINDINGS: There is no  evidence of hip fracture or dislocation. There is no evidence of arthropathy or other focal bone abnormality.  IMPRESSION: Negative.   Electronically Signed   By: Shon Hale M.D.   On: 07/19/2013 10:58    MDM  No diagnosis found. Strongly doubt occult hip fracture denies hip pain Plan prescription Norco Followup Dr. Karlton Lemon if having significant pain 4 or5 days Diagnosis #1 fall #2 contusion to lower back    Orlie Dakin, MD 07/19/13 1133

## 2013-07-22 ENCOUNTER — Encounter: Payer: Self-pay | Admitting: Podiatry

## 2013-07-22 ENCOUNTER — Ambulatory Visit (INDEPENDENT_AMBULATORY_CARE_PROVIDER_SITE_OTHER): Payer: BLUE CROSS/BLUE SHIELD | Admitting: Podiatry

## 2013-07-22 VITALS — BP 144/85 | HR 68 | Ht 65.0 in | Wt 211.0 lb

## 2013-07-22 DIAGNOSIS — S8990XA Unspecified injury of unspecified lower leg, initial encounter: Secondary | ICD-10-CM

## 2013-07-22 DIAGNOSIS — M25579 Pain in unspecified ankle and joints of unspecified foot: Secondary | ICD-10-CM

## 2013-07-22 DIAGNOSIS — M79671 Pain in right foot: Secondary | ICD-10-CM

## 2013-07-22 DIAGNOSIS — S99929A Unspecified injury of unspecified foot, initial encounter: Secondary | ICD-10-CM | POA: Insufficient documentation

## 2013-07-22 DIAGNOSIS — M25571 Pain in right ankle and joints of right foot: Secondary | ICD-10-CM

## 2013-07-22 DIAGNOSIS — M79609 Pain in unspecified limb: Secondary | ICD-10-CM

## 2013-07-22 DIAGNOSIS — S99921A Unspecified injury of right foot, initial encounter: Secondary | ICD-10-CM

## 2013-07-22 MED ORDER — NABUMETONE 500 MG PO TABS: 500.0000 mg | ORAL_TABLET | Freq: Two times a day (BID) | ORAL | Status: DC | PRN

## 2013-07-22 NOTE — Progress Notes (Signed)
Patient fell and twisted right foot 4 days ago. The right foot gave in and fell down. Pain on midfoot with and without weight bearing. She has checked out her hip and found to be only a bruise and no broken bones.   Objective: Minimum edema and no erythema noted. Pain with weight bearing and non weight bearing over the Lisfranc joints right foot. Radiographs of right foot taken. No acute changes seen. No fracture noted in AP and Lateral view.  Assessment: Tenosynovitis lisfranc joint right foot following a fall. Pain on right foot.  Plan: Right foot and ankle wrapped with Unna boot followed by Coban and Ace wrap. Surgical shoe dispensed.

## 2013-07-22 NOTE — Patient Instructions (Signed)
Seen for painful injured right foot. Unna boot applied with Coban and Ace wrap. Keep the wrap for 2 weeks and return to replace. May ambulate as tolerated. Minimize weight bearing and ambulation if possible for the next 2 weeks.  Return in two weeks.

## 2013-08-04 ENCOUNTER — Ambulatory Visit (INDEPENDENT_AMBULATORY_CARE_PROVIDER_SITE_OTHER): Payer: BLUE CROSS/BLUE SHIELD | Admitting: Podiatry

## 2013-08-04 ENCOUNTER — Encounter: Payer: Self-pay | Admitting: Podiatry

## 2013-08-04 VITALS — BP 122/70 | HR 71 | Ht 65.0 in | Wt 211.0 lb

## 2013-08-04 DIAGNOSIS — M21961 Unspecified acquired deformity of right lower leg: Secondary | ICD-10-CM

## 2013-08-04 DIAGNOSIS — S99921A Unspecified injury of right foot, initial encounter: Secondary | ICD-10-CM

## 2013-08-04 DIAGNOSIS — M25579 Pain in unspecified ankle and joints of unspecified foot: Secondary | ICD-10-CM

## 2013-08-04 DIAGNOSIS — Z5189 Encounter for other specified aftercare: Secondary | ICD-10-CM

## 2013-08-04 DIAGNOSIS — S99921D Unspecified injury of right foot, subsequent encounter: Secondary | ICD-10-CM

## 2013-08-04 DIAGNOSIS — M21969 Unspecified acquired deformity of unspecified lower leg: Secondary | ICD-10-CM

## 2013-08-04 DIAGNOSIS — M25571 Pain in right ankle and joints of right foot: Secondary | ICD-10-CM

## 2013-08-04 NOTE — Progress Notes (Signed)
Subjective: Follow up on right foot injury. Stated that she is still in pain at near the ankle on top of right foot. She has noted a popping sound on right foot when as she was getting up from her recliner and had much pain afterward. Stated that Compound cream as helped. She has a trip planned to [place].    Objective: There is no visible edema or erythema. Pain with weight bearing on right. Normal range of motion on forefoot and rearfoot. All neurovascular status are within normal.  Assessment: Arthropathy right midfoot following right foot injury. Tenosynovitis at Lisfranc joint, chronic.  Plan: Reviewed the benefit of compound cream and ankle brace. Ankle brace medium dispensed. Sample Compound cream dispensed.

## 2013-08-04 NOTE — Patient Instructions (Signed)
Seen for follow up on right foot pain.  May use Ankle brace with Tennis shoes.

## 2013-08-12 ENCOUNTER — Ambulatory Visit: Payer: Medicare Other | Admitting: Cardiovascular Disease

## 2013-08-19 ENCOUNTER — Other Ambulatory Visit: Payer: Self-pay | Admitting: Family

## 2013-08-19 ENCOUNTER — Ambulatory Visit
Admission: RE | Admit: 2013-08-19 | Discharge: 2013-08-19 | Disposition: A | Discharge status: Home / Self Care | Payer: Medicare Other | Source: Ambulatory Visit | Attending: Family | Admitting: Family

## 2013-08-19 DIAGNOSIS — R0602 Shortness of breath: Secondary | ICD-10-CM

## 2013-08-19 DIAGNOSIS — R05 Cough: Secondary | ICD-10-CM

## 2013-08-19 DIAGNOSIS — J111 Influenza due to unidentified influenza virus with other respiratory manifestations: Secondary | ICD-10-CM

## 2013-08-19 DIAGNOSIS — R059 Cough, unspecified: Secondary | ICD-10-CM

## 2013-08-25 ENCOUNTER — Ambulatory Visit: Payer: Medicare Other | Admitting: Cardiovascular Disease

## 2013-10-22 ENCOUNTER — Encounter: Payer: Self-pay | Admitting: Cardiovascular Disease

## 2013-10-22 ENCOUNTER — Ambulatory Visit (INDEPENDENT_AMBULATORY_CARE_PROVIDER_SITE_OTHER): Payer: Medicare HMO | Admitting: Cardiovascular Disease

## 2013-10-22 VITALS — BP 126/72 | HR 76 | Resp 16 | Ht 65.5 in | Wt 214.0 lb

## 2013-10-22 DIAGNOSIS — I2510 Atherosclerotic heart disease of native coronary artery without angina pectoris: Secondary | ICD-10-CM

## 2013-10-22 DIAGNOSIS — E785 Hyperlipidemia, unspecified: Secondary | ICD-10-CM

## 2013-10-22 MED ORDER — LOSARTAN POTASSIUM-HCTZ 100-25 MG PO TABS: 0.5000 | ORAL_TABLET | Freq: Every day | ORAL | Status: DC

## 2013-10-22 NOTE — Patient Instructions (Signed)
DECREASE Losartan HCTZ to 1/2 tablet daily (50/12.5mg ).  Dr. Sallyanne Kuster recommends that you schedule a follow-up appointment in: 3 MONTHS.

## 2013-10-26 ENCOUNTER — Encounter: Payer: Self-pay | Admitting: Cardiovascular Disease

## 2013-10-26 MED ORDER — PRAVASTATIN SODIUM 80 MG PO TABS: 80.0000 mg | ORAL_TABLET | Freq: Every day | ORAL | Status: DC

## 2013-10-26 NOTE — Assessment & Plan Note (Signed)
As before, the biggest challenges finding an antianginal regimen that'll not cause symptomatic hypotension. Her episodes of pre-syncope was likely related to prolonged orthostasis but she is again proven to be intolerance of higher doses of nitrates. For the same reason we are avoiding calcium channel blockers. Will try to see if cutting back her losartan/hydrochlorothiazide dose will allow more antianginal therapy without causing worsening lower showed edema (the edema is mostly due to venous insufficiency). Ranexa would've been the ideal agent for her but she was unable to tolerate even a lower dose due to constipation

## 2013-10-26 NOTE — Progress Notes (Signed)
Patient ID: Jill Preston, female   DOB: Apr 16, 1933, 80 y.o.   MRN: [id number]      Reason for office visit CAD, dyslipidemia, hypertension  Jill Preston is a 80 year old woman with history of coronary artery disease status post remote placement of a bare-metal stent in the proximal LAD artery in the year 2000. The stent was found to be patent by cardiac catheterizations in 2002 and 2013. She has some residual distal disease especially prominent in the diagonal artery and has had some chest pain that might be angina. Is a mild perfusion defect in this distribution on nuclear stress testing . Chest aches were improved with Ranexa, but she had to stop this medication due to severe constipation. We therefore increased her dose of isosorbide  She is severely obese and has hypertension and dyslipidemia, venous insufficiency and chronic kidney disease stage III (estimated GFR 36 mL/minutes).  Since her last visit she has not been troubled by angina pectoris. Whenever she checks her blood pressure is normal. She had one episode of severe weakness associated with diaphoresis and dizziness that occurred while she was standing up and cooking. It resolved after 15-30 minutes of lying down. She has symptoms of orthostatic hypotension in the mornings.   She is very inactive. She had the "flu" for for 5 weeks between Thanksgiving and Christmas and still has hoarseness and weakness since then. She has pain in her trapezius muscle neck and the back of her head since that time.   Allergies  Allergen Reactions  . Atorvastatin Anaphylaxis    Rxn unknown  . Latex     Rxn is blisters  . Shellfish Allergy Nausea And Vomiting  . Ciprofloxacin Hives  . Codeine Nausea And Vomiting  . Contrast Media [Iodinated Diagnostic Agents]     Flushing  . Gelnique [Oxybutynin]     Rxn is blisters  . Penicillins Hives    Current Outpatient Prescriptions  Medication Sig Dispense Refill  . aspirin EC 81 MG tablet  Take 1 tablet (81 mg total) by mouth daily.  90 tablet  3  . docusate sodium (COLACE) 50 MG capsule Take by mouth 2 (two) times daily.      Marland Kitchen estradiol (ESTRACE) 0.1 MG/GM vaginal cream Place 2 g vaginally daily as needed. For urinary tract infection       . fenofibrate 160 MG tablet Take 160 mg by mouth daily.        Marland Kitchen ibuprofen (ADVIL,MOTRIN) 200 MG tablet Take 200 mg by mouth every 6 (six) hours as needed for pain.      . isosorbide mononitrate (IMDUR) 30 MG 24 hr tablet Take 2 tablets (60 mg total) by mouth 2 (two) times daily.  60 tablet  12  . levothyroxine (SYNTHROID, LEVOTHROID) 125 MCG tablet Take 125 mcg by mouth daily before breakfast.      . losartan-hydrochlorothiazide (HYZAAR) 100-25 MG per tablet Take 0.5 tablets by mouth daily.  30 tablet  3  . Magnesium 250 MG TABS Take 1 tablet by mouth daily.        . Multiple Vitamins-Minerals (CENTRUM SILVER ADULT 50+) TABS Take by mouth.      . nitroGLYCERIN (NITROSTAT) 0.4 MG SL tablet Place 1 tablet (0.4 mg total) under the tongue every 5 (five) minutes as needed for chest pain.  25 tablet  2  . pravastatin (PRAVACHOL) 20 MG tablet Take 20 mg by mouth daily.        Marland Kitchen pyridoxine (B-6) 100 MG tablet Take  100 mg by mouth daily.      . vitamin B-12 (CYANOCOBALAMIN) 1000 MCG tablet Take 2,500 mcg by mouth daily.       . vitamin C (ASCORBIC ACID) 250 MG tablet Take 1,000 mg by mouth daily.        Current Facility-Administered Medications  Medication Dose Route Frequency Provider Last Rate Last Dose  . Arch Bandage MISC 1 each  1 each Does not apply Once Estelle Grumbles, DPM        Past Medical History  Diagnosis Date  . SOB (shortness of breath)     2D ECHO, 04/27/2010 - EF >55%, normal  . Chest pain, atypical     LEXISCAN, 08/29/2011 - anterior wall defect, partially reversible-suggestive of artifact and mild ischemia in LAD, post stress EF 68%, no ECG changes  . Edema of lower extremity     LEA VENOUS, 10/17/2011 - mild reflux in bilateral  common femoral veins    Past Surgical History  Procedure Laterality Date  . Cardiac catheterization Left 09/25/2011    Medical management  . Cardiac catheterization Left 03/25/2001    Normal LV function, LAD residual narrowing of less than 10%, normal ramus intermediate, circumflex, and RCA,   . Cardiac catheterization  09/04/1999    LAD, 3x62mm Tetra stent resulting in a reduction of the 80% stenosis to 0% residual    Family History  Problem Relation Age of Onset  . Cancer Mother   . Heart disease Father   . Heart disease Sister   . Heart disease Brother     History   Social History  . Marital Status: Married    Spouse Name: N/A    Number of Children: N/A  . Years of Education: N/A   Occupational History  . Not on file.   Social History Main Topics  . Smoking status: Never Smoker   . Smokeless tobacco: Never Used  . Alcohol Use: No  . Drug Use: No  . Sexual Activity: Not on file   Other Topics Concern  . Not on file   Social History Narrative  . No narrative on file    Review of systems: The patient specifically denies any chest pain at rest or with exertion, dyspnea at rest or with exertion, orthopnea, paroxysmal nocturnal dyspnea, syncope, palpitations, focal neurological deficits, intermittent claudication, unexplained weight gain, hemoptysis or wheezing.  The patient also denies abdominal pain, nausea, vomiting, dysphagia, diarrhea, constipation, polyuria, polydipsia, dysuria, hematuria, frequency, urgency, abnormal bleeding or bruising, fever, chills, unexpected weight changes, mood swings, change in skin or hair texture, auditory or visual problems, allergic reactions or rashes, new musculoskeletal complaints other than usual "aches and pains".   PHYSICAL EXAM BP 126/72  Pulse 76  Resp 16  Ht 5' 5.5" (1.664 m)  Wt 97.07 kg (214 lb)  BMI 35.06 kg/m2 General: Alert, oriented x3, no distress  Head: no evidence of trauma, PERRL, EOMI, no exophtalmos or lid  lag, no myxedema, no xanthelasma; normal ears, nose and oropharynx  Neck: normal jugular venous pulsations and no hepatojugular reflux; brisk carotid pulses without delay and no carotid bruits  Chest: clear to auscultation, no signs of consolidation by percussion or palpation, normal fremitus, symmetrical and full respiratory excursions  Cardiovascular: normal position and quality of the apical impulse, regular rhythm, normal first and second heart sounds, no murmurs, rubs or gallops  Abdomen: no tenderness or distention, no masses by palpation, no abnormal pulsatility or arterial bruits, normal bowel sounds, no hepatosplenomegaly  Extremities: no clubbing, cyanosis or edema; 2+ radial, ulnar and brachial pulses bilaterally; 2+ right femoral, posterior tibial and dorsalis pedis pulses; 2+ left femoral, posterior tibial and dorsalis pedis pulses; no subclavian or femoral bruits  Neurological: grossly nonfocal    EKG: Normal sinus rhythm, nonspecific ST-T changes most obvious in leads V5 and V6 1 and aVL  Lipid Panel  September 18, 2013 total cholesterol 215, HDL 58, LDL 131, LDL particles 2340, small LDL 1055, triglycerides 129  TSH 6.87, free T4-1 0.73, free T3-1 0.5 (low) Creatinine 1.6 to (GFR 30), glucose 121, hemoglobin 15.1    Component Value Date/Time   CHOL 162 04/18/2013 0826   TRIG 140 04/18/2013 0826   HDL 42 04/18/2013 0826   CHOLHDL 3.9 04/18/2013 0826   VLDL 28 04/18/2013 0826   LDLCALC 92 04/18/2013 0826    BMET    Component Value Date/Time   NA 140 04/18/2013 0826   K 3.9 04/18/2013 0826   CL 103 04/18/2013 0826   CO2 28 04/18/2013 0826   GLUCOSE 125* 04/18/2013 0826   BUN 17 04/18/2013 0826   CREATININE 1.40* 04/18/2013 0826   CREATININE 1.77* 07/24/2011 0620   CALCIUM 9.9 04/18/2013 0826   GFRNONAA 27* 07/24/2011 0620   GFRAA 31* 07/24/2011 0620     ASSESSMENT AND PLAN CAD LAD stent 12/00. Patent '02 and 1/13 As before, the biggest challenges finding an antianginal regimen that'll not  cause symptomatic hypotension. Her episodes of pre-syncope was likely related to prolonged orthostasis but she is again proven to be intolerance of higher doses of nitrates. For the same reason we are avoiding calcium channel blockers. Will try to see if cutting back her losartan/hydrochlorothiazide dose will allow more antianginal therapy without causing worsening lower showed edema (the edema is mostly due to venous insufficiency). Ranexa would've been the ideal agent for her but she was unable to tolerate even a lower dose due to constipation  Dyslipidemia Her lipid profile is actually quite discouraging with worsening LDL and LDL particle number I would recommend increasing the dose of pravastatin. Notes that she was intolerance to atorvastatin. If a repeat lipid profile is still unsatisfactory, consider switching to Crestor.   Orders Placed This Encounter  Procedures  . EKG 12-Lead   Meds ordered this encounter  Medications  . losartan-hydrochlorothiazide (HYZAAR) 100-25 MG per tablet    Sig: Take 0.5 tablets by mouth daily.    Dispense:  30 tablet    Refill:  3  . pravastatin (PRAVACHOL) 80 MG tablet    Sig: Take 1 tablet (80 mg total) by mouth at bedtime.    Dispense:  90 tablet    Refill:  Inyo Angely Dietz, MD, [facility] [phone] office [phone] pager

## 2013-10-26 NOTE — Assessment & Plan Note (Signed)
Her lipid profile is actually quite discouraging with worsening LDL and LDL particle number I would recommend increasing the dose of pravastatin. Notes that she was intolerance to atorvastatin. If a repeat lipid profile is still unsatisfactory, consider switching to Crestor.

## 2013-10-27 ENCOUNTER — Telehealth: Payer: Self-pay | Admitting: Cardiovascular Disease

## 2013-10-27 NOTE — Telephone Encounter (Signed)
Blood pressure is going up,wants to know if she should go back on the half a pill he took her off of?(losortan)

## 2013-10-27 NOTE — Telephone Encounter (Signed)
Returned call.  Left message w/o pt-identifiable information that Dr. Loletha Grayer would like BP to be checked a few more days and to call back w/ reading.  Also to call back before 4pm if questions.

## 2013-10-27 NOTE — Telephone Encounter (Signed)
Returned call and pt verified x 2.  Pt stated Dr. Loletha Grayer cut her losartan when she was seen on the 5th and told her to watch her BP.  Stated BP was about 137/74 and 157/80 HR 63 both times.  Pt denied having any symptoms.  Stated she just wanted to let Dr. Loletha Grayer know b/c he told her to watch her BP.  Pt informed Dr. Loletha Grayer will be notified and RN will contact her once response given.  Stated she has been taking half of the pill as instructed.  Pt verbalized understanding and agreed w/ plan.  Message forwarded to Dr. Sallyanne Kuster.

## 2013-10-27 NOTE — Telephone Encounter (Signed)
Please tell her I'd like her to check a few more days to make sure we see where her blood pressure levels off before making other adjustments.

## 2013-11-05 ENCOUNTER — Other Ambulatory Visit: Payer: Self-pay | Admitting: *Deleted

## 2013-11-05 MED ORDER — ISOSORBIDE MONONITRATE ER 30 MG PO TB24: 60.0000 mg | ORAL_TABLET | Freq: Two times a day (BID) | ORAL | Status: DC

## 2013-11-05 NOTE — Telephone Encounter (Signed)
Rx was sent to pharmacy electronically. 

## 2013-12-31 ENCOUNTER — Telehealth: Payer: Self-pay | Admitting: *Deleted

## 2013-12-31 NOTE — Telephone Encounter (Signed)
Requested a copy of stent card from 1999 placed by Dr. Melvern Banker.  No copy of stent card available. Faxed cath report of 09/04/1999 which states stent type placed.

## 2014-01-19 ENCOUNTER — Encounter: Payer: Self-pay | Admitting: Cardiovascular Disease

## 2014-01-19 ENCOUNTER — Ambulatory Visit (INDEPENDENT_AMBULATORY_CARE_PROVIDER_SITE_OTHER): Payer: Medicare HMO | Admitting: Cardiovascular Disease

## 2014-01-19 VITALS — BP 142/70 | HR 68 | Resp 16 | Ht 65.5 in | Wt 207.8 lb

## 2014-01-19 DIAGNOSIS — I2510 Atherosclerotic heart disease of native coronary artery without angina pectoris: Secondary | ICD-10-CM

## 2014-01-19 DIAGNOSIS — I872 Venous insufficiency (chronic) (peripheral): Secondary | ICD-10-CM

## 2014-01-19 DIAGNOSIS — E785 Hyperlipidemia, unspecified: Secondary | ICD-10-CM

## 2014-01-19 NOTE — Patient Instructions (Signed)
Dr Croitoru wants you to follow-up in 1 year. You will receive a reminder letter in the mail one months in advance. If you don't receive a letter, please call our office to schedule the follow-up appointment. 

## 2014-01-21 ENCOUNTER — Encounter: Payer: Self-pay | Admitting: Cardiovascular Disease

## 2014-01-21 NOTE — Assessment & Plan Note (Signed)
Angina is now well controlled. In the past she has not tolerated Ranexa and other meds caused orthostatic hypotension when used in higher doses.

## 2014-01-21 NOTE — Assessment & Plan Note (Signed)
Ankle edema resolves if she elevates her legs

## 2014-01-21 NOTE — Progress Notes (Signed)
Patient ID: Jill Preston, female   DOB: 02/07/1933, 80 y.o.   MRN: [id number]     Reason for office visit CAD  Jill Preston is feeling well - no angina on current meds.  Jill Preston is a 80 year old woman with history of coronary artery disease status post remote placement of a bare-metal stent in the proximal LAD artery in the year 2000. The stent was found to be patent by cardiac catheterizations in 2002 and 2013. She has some residual distal disease especially prominent in the diagonal artery and has had some chest pain that might be angina. Is a mild perfusion defect in this distribution on nuclear stress testing . Chest aches were improved with Ranexa, but she had to stop this medication due to severe constipation. We therefore increased her dose of isosorbide  She is severely obese and has hypertension and dyslipidemia, venous insufficiency and chronic kidney disease stage III (estimated GFR 36 mL/minutes).     Allergies  Allergen Reactions  . Atorvastatin Anaphylaxis    Rxn unknown  . Latex     Rxn is blisters  . Shellfish Allergy Nausea And Vomiting  . Ciprofloxacin Hives  . Codeine Nausea And Vomiting  . Contrast Media [Iodinated Diagnostic Agents]     Flushing  . Gelnique [Oxybutynin]     Rxn is blisters  . Penicillins Hives    Current Outpatient Prescriptions  Medication Sig Dispense Refill  . aspirin EC 81 MG tablet Take 1 tablet (81 mg total) by mouth daily.  90 tablet  3  . docusate sodium (COLACE) 50 MG capsule Take by mouth 2 (two) times daily.      Marland Kitchen estradiol (ESTRACE) 0.1 MG/GM vaginal cream Place 2 g vaginally daily as needed. For urinary tract infection       . fenofibrate 160 MG tablet Take 160 mg by mouth daily.        Marland Kitchen ibuprofen (ADVIL,MOTRIN) 200 MG tablet Take 200 mg by mouth every 6 (six) hours as needed for pain.      . isosorbide mononitrate (IMDUR) 30 MG 24 hr tablet Take 2 tablets (60 mg total) by mouth 2 (two) times daily.  360 tablet  2  .  levothyroxine (SYNTHROID, LEVOTHROID) 125 MCG tablet Take 125 mcg by mouth daily before breakfast.      . losartan-hydrochlorothiazide (HYZAAR) 100-25 MG per tablet Take 0.5 tablets by mouth daily.  30 tablet  3  . Magnesium 250 MG TABS Take 1 tablet by mouth daily.        . Multiple Vitamins-Minerals (CENTRUM SILVER ADULT 50+) TABS Take by mouth.      . nitroGLYCERIN (NITROSTAT) 0.4 MG SL tablet Place 1 tablet (0.4 mg total) under the tongue every 5 (five) minutes as needed for chest pain.  25 tablet  2  . pravastatin (PRAVACHOL) 80 MG tablet Take 1 tablet (80 mg total) by mouth at bedtime.  90 tablet  3  . pyridoxine (B-6) 100 MG tablet Take 100 mg by mouth daily.      . vitamin B-12 (CYANOCOBALAMIN) 1000 MCG tablet Take 2,500 mcg by mouth daily.       . vitamin C (ASCORBIC ACID) 250 MG tablet Take 1,000 mg by mouth daily.        Current Facility-Administered Medications  Medication Dose Route Frequency Provider Last Rate Last Dose  . Arch Bandage MISC 1 each  1 each Does not apply Once Estelle Grumbles, DPM        Past Medical  History  Diagnosis Date  . SOB (shortness of breath)     2D ECHO, 04/27/2010 - EF >55%, normal  . Chest pain, atypical     LEXISCAN, 08/29/2011 - anterior wall defect, partially reversible-suggestive of artifact and mild ischemia in LAD, post stress EF 68%, no ECG changes  . Edema of lower extremity     LEA VENOUS, 10/17/2011 - mild reflux in bilateral common femoral veins    Past Surgical History  Procedure Laterality Date  . Cardiac catheterization Left 09/25/2011    Medical management  . Cardiac catheterization Left 03/25/2001    Normal LV function, LAD residual narrowing of less than 10%, normal ramus intermediate, circumflex, and RCA,   . Cardiac catheterization  09/04/1999    LAD, 3x41mm Tetra stent resulting in a reduction of the 80% stenosis to 0% residual    Family History  Problem Relation Age of Onset  . Cancer Mother   . Heart disease Father   .  Heart disease Sister   . Heart disease Brother     History   Social History  . Marital Status: Married    Spouse Name: N/A    Number of Children: N/A  . Years of Education: N/A   Occupational History  . Not on file.   Social History Main Topics  . Smoking status: Never Smoker   . Smokeless tobacco: Never Used  . Alcohol Use: No  . Drug Use: No  . Sexual Activity: Not on file   Other Topics Concern  . Not on file   Social History Narrative  . No narrative on file    Review of systems: The patient specifically denies any chest pain at rest or with exertion, dyspnea at rest or with exertion, orthopnea, paroxysmal nocturnal dyspnea, syncope, palpitations, focal neurological deficits, intermittent claudication, lower extremity edema, unexplained weight gain, cough, hemoptysis or wheezing.  The patient also denies abdominal pain, nausea, vomiting, dysphagia, diarrhea, constipation, polyuria, polydipsia, dysuria, hematuria, frequency, urgency, abnormal bleeding or bruising, fever, chills, unexpected weight changes, mood swings, change in skin or hair texture, change in voice quality, auditory or visual problems, allergic reactions or rashes, new musculoskeletal complaints other than usual "aches and pains".   PHYSICAL EXAM BP 142/70  Pulse 68  Resp 16  Ht 5' 5.5" (1.664 m)  Wt 207 lb 12.8 oz (94.257 kg)  BMI 34.04 kg/m2 General: Alert, oriented x3, no distress  Head: no evidence of trauma, PERRL, EOMI, no exophtalmos or lid lag, no myxedema, no xanthelasma; normal ears, nose and oropharynx  Neck: normal jugular venous pulsations and no hepatojugular reflux; brisk carotid pulses without delay and no carotid bruits  Chest: clear to auscultation, no signs of consolidation by percussion or palpation, normal fremitus, symmetrical and full respiratory excursions  Cardiovascular: normal position and quality of the apical impulse, regular rhythm, normal first and second heart sounds,  no murmurs, rubs or gallops  Abdomen: no tenderness or distention, no masses by palpation, no abnormal pulsatility or arterial bruits, normal bowel sounds, no hepatosplenomegaly  Extremities: no clubbing, cyanosis or edema; 2+ radial, ulnar and brachial pulses bilaterally; 2+ right femoral, posterior tibial and dorsalis pedis pulses; 2+ left femoral, posterior tibial and dorsalis pedis pulses; no subclavian or femoral bruits  Neurological: grossly nonfocal  EKG: Normal sinus rhythm, nonspecific ST-T changes most obvious in leads V5 and V6 1 and aVL   Lipid Panel     Component Value Date/Time   CHOL 162 04/18/2013 0826   TRIG 140  04/18/2013 0826   HDL 42 04/18/2013 0826   CHOLHDL 3.9 04/18/2013 0826   VLDL 28 04/18/2013 0826   LDLCALC 92 04/18/2013 0826    BMET    Component Value Date/Time   NA 140 04/18/2013 0826   K 3.9 04/18/2013 0826   CL 103 04/18/2013 0826   CO2 28 04/18/2013 0826   GLUCOSE 125* 04/18/2013 0826   BUN 17 04/18/2013 0826   CREATININE 1.40* 04/18/2013 0826   CREATININE 1.77* 07/24/2011 0620   CALCIUM 9.9 04/18/2013 0826   GFRNONAA 27* 07/24/2011 0620   GFRAA 31* 07/24/2011 0620     ASSESSMENT AND PLAN CAD LAD stent 12/00. Patent '02 and 1/13 Angina is now well controlled. In the past she has not tolerated Ranexa and other meds caused orthostatic hypotension when used in higher doses.  Dyslipidemia Fair lipid profile. Ideally LDL would be <70, but intolerant to more potent statins like atorvastatin and Crestor is too costly  Venous insufficiency Ankle edema resolves if she elevates her legs    Ambriana Selway  Sanda Klein, MD, [facility] [phone] office [phone] pager

## 2014-01-21 NOTE — Assessment & Plan Note (Signed)
Fair lipid profile. Ideally LDL would be <70, but intolerant to more potent statins like atorvastatin and Crestor is too costly

## 2014-02-17 ENCOUNTER — Other Ambulatory Visit: Payer: Self-pay | Admitting: Family Medicine

## 2014-02-17 ENCOUNTER — Ambulatory Visit
Admission: RE | Admit: 2014-02-17 | Discharge: 2014-02-17 | Disposition: A | Discharge status: Home / Self Care | Payer: Commercial Managed Care - HMO | Source: Ambulatory Visit | Attending: Family Medicine | Admitting: Family Medicine

## 2014-02-17 DIAGNOSIS — M79643 Pain in unspecified hand: Secondary | ICD-10-CM

## 2014-02-17 DIAGNOSIS — M542 Cervicalgia: Secondary | ICD-10-CM

## 2014-04-05 ENCOUNTER — Other Ambulatory Visit: Payer: Self-pay

## 2014-04-05 DIAGNOSIS — Z1231 Encounter for screening mammogram for malignant neoplasm of breast: Secondary | ICD-10-CM

## 2014-04-20 ENCOUNTER — Ambulatory Visit: Payer: Commercial Managed Care - HMO

## 2014-05-17 ENCOUNTER — Telehealth: Payer: Self-pay | Admitting: Cardiovascular Disease

## 2014-05-17 NOTE — Telephone Encounter (Signed)
Spoke with pt, she is having a sharpe pain and numbness in her hand that travels up her left arm and into her neck. It is a throbbing pain, like a tooth ache. She denies SOB or chest pain. Blue emu and aleve help ease the discomfort but it does not go away completely. She has talked to the surgeon who did the surgery on her neck several years ago and he is doing a MRI. Using the hand or arm will make the pain worse. Reassurance given to patient, it does not sound like it is related to her heart. She is going to call about getting the MRI scheduled. Pt agreed with this plan.

## 2014-05-17 NOTE — Telephone Encounter (Signed)
Pt is having tingling and numbness in her left hand.At first it started with numbness and than she had pain in arm and running up to her neck.

## 2014-05-26 ENCOUNTER — Ambulatory Visit
Admission: RE | Admit: 2014-05-26 | Discharge: 2014-05-26 | Disposition: A | Discharge status: Home / Self Care | Payer: Commercial Managed Care - HMO | Source: Ambulatory Visit | Attending: Family Medicine | Admitting: Family Medicine

## 2014-05-26 ENCOUNTER — Other Ambulatory Visit: Payer: Self-pay | Admitting: Family Medicine

## 2014-05-26 DIAGNOSIS — R52 Pain, unspecified: Secondary | ICD-10-CM

## 2014-07-04 ENCOUNTER — Emergency Department (HOSPITAL_COMMUNITY): Payer: Medicare HMO

## 2014-07-04 ENCOUNTER — Encounter (HOSPITAL_COMMUNITY): Payer: Self-pay | Admitting: Emergency Medicine

## 2014-07-04 ENCOUNTER — Inpatient Hospital Stay (HOSPITAL_COMMUNITY)
Admission: EM | Admit: 2014-07-04 | Discharge: 2014-07-08 | DRG: 291 | Disposition: A | Discharge status: Home / Self Care | Payer: Medicare HMO | Attending: Cardiology | Admitting: Cardiology

## 2014-07-04 DIAGNOSIS — E039 Hypothyroidism, unspecified: Secondary | ICD-10-CM | POA: Diagnosis present

## 2014-07-04 DIAGNOSIS — I130 Hypertensive heart and chronic kidney disease with heart failure and stage 1 through stage 4 chronic kidney disease, or unspecified chronic kidney disease: Principal | ICD-10-CM | POA: Diagnosis present

## 2014-07-04 DIAGNOSIS — I160 Hypertensive urgency: Secondary | ICD-10-CM

## 2014-07-04 DIAGNOSIS — R0602 Shortness of breath: Secondary | ICD-10-CM

## 2014-07-04 DIAGNOSIS — I5032 Chronic diastolic (congestive) heart failure: Secondary | ICD-10-CM | POA: Diagnosis present

## 2014-07-04 DIAGNOSIS — Z6835 Body mass index (BMI) 35.0-35.9, adult: Secondary | ICD-10-CM

## 2014-07-04 DIAGNOSIS — N183 Chronic kidney disease, stage 3 unspecified: Secondary | ICD-10-CM | POA: Diagnosis present

## 2014-07-04 DIAGNOSIS — I10 Essential (primary) hypertension: Secondary | ICD-10-CM | POA: Diagnosis present

## 2014-07-04 DIAGNOSIS — IMO0001 Reserved for inherently not codable concepts without codable children: Secondary | ICD-10-CM | POA: Diagnosis present

## 2014-07-04 DIAGNOSIS — E785 Hyperlipidemia, unspecified: Secondary | ICD-10-CM | POA: Diagnosis present

## 2014-07-04 DIAGNOSIS — Z7982 Long term (current) use of aspirin: Secondary | ICD-10-CM

## 2014-07-04 DIAGNOSIS — I5033 Acute on chronic diastolic (congestive) heart failure: Secondary | ICD-10-CM

## 2014-07-04 DIAGNOSIS — Z9861 Coronary angioplasty status: Secondary | ICD-10-CM

## 2014-07-04 DIAGNOSIS — Z79899 Other long term (current) drug therapy: Secondary | ICD-10-CM

## 2014-07-04 DIAGNOSIS — I2510 Atherosclerotic heart disease of native coronary artery without angina pectoris: Secondary | ICD-10-CM | POA: Diagnosis present

## 2014-07-04 DIAGNOSIS — I872 Venous insufficiency (chronic) (peripheral): Secondary | ICD-10-CM | POA: Diagnosis present

## 2014-07-04 DIAGNOSIS — I509 Heart failure, unspecified: Secondary | ICD-10-CM

## 2014-07-04 DIAGNOSIS — E876 Hypokalemia: Secondary | ICD-10-CM | POA: Diagnosis present

## 2014-07-04 DIAGNOSIS — E669 Obesity, unspecified: Secondary | ICD-10-CM | POA: Diagnosis present

## 2014-07-04 DIAGNOSIS — I119 Hypertensive heart disease without heart failure: Secondary | ICD-10-CM | POA: Diagnosis present

## 2014-07-04 HISTORY — DX: Headache, unspecified: R51.9

## 2014-07-04 HISTORY — DX: Chronic diastolic (congestive) heart failure: I50.32

## 2014-07-04 HISTORY — DX: Obesity, unspecified: E66.9

## 2014-07-04 HISTORY — DX: Hyperlipidemia, unspecified: E78.5

## 2014-07-04 HISTORY — DX: Atherosclerotic heart disease of native coronary artery without angina pectoris: I25.10

## 2014-07-04 HISTORY — DX: Headache: R51

## 2014-07-04 HISTORY — DX: Chronic kidney disease, stage 3 (moderate): N18.3

## 2014-07-04 HISTORY — DX: Essential (primary) hypertension: I10

## 2014-07-04 HISTORY — DX: Venous insufficiency (chronic) (peripheral): I87.2

## 2014-07-04 HISTORY — DX: Hypothyroidism, unspecified: E03.9

## 2014-07-04 HISTORY — DX: Chronic kidney disease, stage 3 unspecified: N18.30

## 2014-07-04 HISTORY — DX: Hypertensive heart disease without heart failure: I11.9

## 2014-07-04 LAB — CBC WITH DIFFERENTIAL/PLATELET
Basophils Absolute: 0.1 10*3/uL (ref 0.0–0.1)
Basophils Relative: 1 % (ref 0–1)
Eosinophils Absolute: 0.6 10*3/uL (ref 0.0–0.7)
Eosinophils Relative: 8 % — ABNORMAL HIGH (ref 0–5)
HCT: 42.6 % (ref 36.0–46.0)
Hemoglobin: 14.3 g/dL (ref 12.0–15.0)
Lymphocytes Relative: 21 % (ref 12–46)
Lymphs Abs: 1.6 10*3/uL (ref 0.7–4.0)
MCH: 31.7 pg (ref 26.0–34.0)
MCHC: 33.6 g/dL (ref 30.0–36.0)
MCV: 94.5 fL (ref 78.0–100.0)
Monocytes Absolute: 0.5 10*3/uL (ref 0.1–1.0)
Monocytes Relative: 6 % (ref 3–12)
Neutro Abs: 4.9 10*3/uL (ref 1.7–7.7)
Neutrophils Relative %: 64 % (ref 43–77)
Platelets: 186 10*3/uL (ref 150–400)
RBC: 4.51 MIL/uL (ref 3.87–5.11)
RDW: 12.7 % (ref 11.5–15.5)
WBC: 7.6 10*3/uL (ref 4.0–10.5)

## 2014-07-04 LAB — COMPREHENSIVE METABOLIC PANEL
ALT: 14 U/L (ref 0–35)
AST: 20 U/L (ref 0–37)
Albumin: 3.7 g/dL (ref 3.5–5.2)
Alkaline Phosphatase: 56 U/L (ref 39–117)
Anion gap: 12 (ref 5–15)
BUN: 21 mg/dL (ref 6–23)
CO2: 27 mEq/L (ref 19–32)
Calcium: 9.4 mg/dL (ref 8.4–10.5)
Chloride: 101 mEq/L (ref 96–112)
Creatinine, Ser: 1.08 mg/dL (ref 0.50–1.10)
GFR calc Af Amer: 55 mL/min — ABNORMAL LOW (ref >90)
GFR calc non Af Amer: 48 mL/min — ABNORMAL LOW (ref >90)
Glucose, Bld: 89 mg/dL (ref 70–99)
Potassium: 3.7 mEq/L (ref 3.7–5.3)
Sodium: 140 mEq/L (ref 137–147)
Total Bilirubin: 0.8 mg/dL (ref 0.3–1.2)
Total Protein: 6.8 g/dL (ref 6.0–8.3)

## 2014-07-04 LAB — PROTIME-INR
INR: 1.12 (ref 0.00–1.49)
Prothrombin Time: 14.5 seconds (ref 11.6–15.2)

## 2014-07-04 LAB — D-DIMER, QUANTITATIVE: D-Dimer, Quant: 0.65 ug/mL-FEU — ABNORMAL HIGH (ref 0.00–0.48)

## 2014-07-04 LAB — PRO B NATRIURETIC PEPTIDE: Pro B Natriuretic peptide (BNP): 872.5 pg/mL — ABNORMAL HIGH (ref 0–450)

## 2014-07-04 LAB — TROPONIN I: Troponin I: <0.3 ng/mL (ref <0.30)

## 2014-07-04 MED ORDER — SODIUM CHLORIDE 0.9 % IV SOLN: 250.0000 mL | INTRAVENOUS | Status: DC | PRN

## 2014-07-04 MED ORDER — TECHNETIUM TO 99M ALBUMIN AGGREGATED
6.0000 | Freq: Once | INTRAVENOUS | Status: AC | PRN
  Administered 2014-07-04: 6 via INTRAVENOUS

## 2014-07-04 MED ORDER — ONDANSETRON HCL 4 MG/2ML IJ SOLN: 4.0000 mg | Freq: Four times a day (QID) | INTRAMUSCULAR | Status: DC | PRN

## 2014-07-04 MED ORDER — LOSARTAN POTASSIUM 50 MG PO TABS
100.0000 mg | ORAL_TABLET | Freq: Every day | ORAL | Status: DC
  Administered 2014-07-04 – 2014-07-08 (×5): 100 mg via ORAL
  Filled 2014-07-04 (×5): qty 2

## 2014-07-04 MED ORDER — FUROSEMIDE 10 MG/ML IJ SOLN
40.0000 mg | Freq: Once | INTRAMUSCULAR | Status: AC
  Administered 2014-07-04: 40 mg via INTRAVENOUS
  Filled 2014-07-04: qty 4

## 2014-07-04 MED ORDER — NITROGLYCERIN 0.4 MG SL SUBL: 0.4000 mg | SUBLINGUAL_TABLET | SUBLINGUAL | Status: DC | PRN

## 2014-07-04 MED ORDER — FENOFIBRATE 160 MG PO TABS
160.0000 mg | ORAL_TABLET | Freq: Every day | ORAL | Status: DC
  Administered 2014-07-04 – 2014-07-08 (×5): 160 mg via ORAL
  Filled 2014-07-04 (×5): qty 1

## 2014-07-04 MED ORDER — ISOSORBIDE MONONITRATE ER 60 MG PO TB24
60.0000 mg | ORAL_TABLET | Freq: Every day | ORAL | Status: DC
  Administered 2014-07-04 – 2014-07-08 (×5): 60 mg via ORAL
  Filled 2014-07-04 (×5): qty 1

## 2014-07-04 MED ORDER — ASPIRIN EC 81 MG PO TBEC
81.0000 mg | DELAYED_RELEASE_TABLET | Freq: Every day | ORAL | Status: DC
  Administered 2014-07-04 – 2014-07-08 (×5): 81 mg via ORAL
  Filled 2014-07-04 (×5): qty 1

## 2014-07-04 MED ORDER — ACETAMINOPHEN 325 MG PO TABS
650.0000 mg | ORAL_TABLET | ORAL | Status: DC | PRN
Start: 2014-07-04 — End: 2014-07-08
  Administered 2014-07-04 – 2014-07-05 (×2): 650 mg via ORAL
  Filled 2014-07-04 (×2): qty 2

## 2014-07-04 MED ORDER — PRAVASTATIN SODIUM 20 MG PO TABS
20.0000 mg | ORAL_TABLET | Freq: Every day | ORAL | Status: DC
  Administered 2014-07-04 – 2014-07-08 (×5): 20 mg via ORAL
  Filled 2014-07-04 (×5): qty 1

## 2014-07-04 MED ORDER — FUROSEMIDE 10 MG/ML IJ SOLN
40.0000 mg | Freq: Two times a day (BID) | INTRAMUSCULAR | Status: DC
  Administered 2014-07-04 – 2014-07-06 (×4): 40 mg via INTRAVENOUS
  Filled 2014-07-04 (×5): qty 4

## 2014-07-04 MED ORDER — GUAIFENESIN-CODEINE 100-10 MG/5ML PO SOLN
5.0000 mL | ORAL | Status: DC | PRN
  Administered 2014-07-04: 5 mL via ORAL
  Filled 2014-07-04: qty 5

## 2014-07-04 MED ORDER — SODIUM CHLORIDE 0.9 % IJ SOLN: 3.0000 mL | INTRAMUSCULAR | Status: DC | PRN

## 2014-07-04 MED ORDER — TECHNETIUM TC 99M DIETHYLENETRIAME-PENTAACETIC ACID: 40.0000 | Freq: Once | INTRAVENOUS | Status: AC | PRN

## 2014-07-04 MED ORDER — SODIUM CHLORIDE 0.9 % IJ SOLN
3.0000 mL | Freq: Two times a day (BID) | INTRAMUSCULAR | Status: DC
  Administered 2014-07-04 – 2014-07-08 (×7): 3 mL via INTRAVENOUS

## 2014-07-04 MED ORDER — LEVOTHYROXINE SODIUM 125 MCG PO TABS
125.0000 ug | ORAL_TABLET | Freq: Every day | ORAL | Status: DC
  Administered 2014-07-05 – 2014-07-08 (×4): 125 ug via ORAL
  Filled 2014-07-04 (×5): qty 1

## 2014-07-04 NOTE — Progress Notes (Signed)
Patient came to the floor around 1800, alert oriented,mild pain to R. Side of the neck , patient refuse to take pain med, bp is elevated, 174/76, SB in 57. No c/o of cp. Will continue to monitor patient.

## 2014-07-04 NOTE — ED Notes (Signed)
Per pt. She is having shortness of breath, chest tightness, and had a high blood pressure at home "200 something over 100 something". Pt has a cuff at home that she monitors BP with. Pt says that the discomfort is radiating down left arm, and up her neck.

## 2014-07-04 NOTE — H&P (Addendum)
Patient ID: Jill Preston MRN: [id number], DOB/AGE: Apr 19, 1933   Admit date: 07/04/2014   Primary Physician: Rachell Cipro, MD Primary Cardiologist: Dr Sallyanne Kuster, Mihai  Pt. Profile:  SOB, hypertesive urgency  Problem List  Past Medical History  Diagnosis Date  . SOB (shortness of breath)     2D ECHO, 04/27/2010 - EF >55%, normal  . Chest pain, atypical     LEXISCAN, 08/29/2011 - anterior wall defect, partially reversible-suggestive of artifact and mild ischemia in LAD, post stress EF 68%, no ECG changes  . Edema of lower extremity     LEA VENOUS, 10/17/2011 - mild reflux in bilateral common femoral veins  . Hyperlipidemia     Past Surgical History  Procedure Laterality Date  . Cardiac catheterization Left 09/25/2011    Medical management  . Cardiac catheterization Left 03/25/2001    Normal LV function, LAD residual narrowing of less than 10%, normal ramus intermediate, circumflex, and RCA,   . Cardiac catheterization  09/04/1999    LAD, 3x11mm Tetra stent resulting in a reduction of the 80% stenosis to 0% residual    Allergies  Allergies  Allergen Reactions  . Atorvastatin Anaphylaxis and Other (See Comments)    Rxn unknown  . Latex Other (See Comments)    Rxn is blisters  . Shellfish Allergy Nausea And Vomiting  . Eggs Or Egg-Derived Products   . Ciprofloxacin Hives  . Codeine Nausea And Vomiting  . Contrast Media [Iodinated Diagnostic Agents] Other (See Comments)    Flushing  . Gelnique [Oxybutynin] Other (See Comments)    Rxn is blisters  . Penicillins Hives   HPI  Patient is a 80 y.o. female with a PMHx of CAD, s/p PCI/ BMS to proximal LAD and balloon angioplasty to mid LAD with suboptimal results in 2000, followed by Dr Sallyanne Kuster, last stress test in 2012 with findings of anterior wall defect, partially reversible-suggestive of artifact and mild ischemia in LAD, post stress EF 68%, no ECG changes. The stent was found to be patent by cardiac  catheterizations in 2002 and 2013. She has some residual distal disease especially prominent in the diagonal artery and has had some chest pain that might be angina. Is a mild perfusion defect in this distribution on nuclear stress testing . Chest aches were improved with Ranexa, but she had to stop this medication due to severe constipation. Dr Sallyanne Kuster therefore increased her dose of isosorbide. Her angina was well controlled during the last visit in May 2015.  She also has severe obesity and has hypertension and dyslipidemia, venous insufficiency and chronic kidney disease stage III (estimated GFR 36 mL/minutes).  The patient presented to ER today with complains of worsening SOB, lower extremity edema and noticed to have BP > 200 mmHg.  She has been experiencing elevated BP for the last 2 weeks, a week ago she developed dry cough. She noticed PND the last night. Minimal activity makes her SOB. No Chest pain.   Home Medications  Prior to Admission medications   Medication Sig Start Date End Date Taking? Authorizing Provider  aspirin EC 81 MG tablet Take 1 tablet (81 mg total) by mouth daily. 02/26/13  Yes Luke K Kilroy, PA-C  diclofenac sodium (VOLTAREN) 1 % GEL Apply 2 g topically 4 (four) times daily. pain   Yes Historical Provider, MD  docusate sodium (COLACE) 50 MG capsule Take by mouth 2 (two) times daily.   Yes Historical Provider, MD  estradiol (ESTRACE) 0.1 MG/GM vaginal cream Place 2 g  vaginally daily as needed. For urinary tract infection    Yes Historical Provider, MD  fenofibrate 160 MG tablet Take 160 mg by mouth daily.     Yes Historical Provider, MD  ibuprofen (ADVIL,MOTRIN) 200 MG tablet Take 200 mg by mouth every 6 (six) hours as needed for pain.   Yes Historical Provider, MD  isosorbide mononitrate (IMDUR) 30 MG 24 hr tablet Take 2 tablets (60 mg total) by mouth 2 (two) times daily. 11/05/13 11/05/14 Yes Mihai Croitoru, MD  levothyroxine (SYNTHROID, LEVOTHROID) 125 MCG tablet Take  125 mcg by mouth daily before breakfast.   Yes Historical Provider, MD  losartan-hydrochlorothiazide (HYZAAR) 100-25 MG per tablet Take 0.5 tablets by mouth daily. 10/22/13  Yes Mihai Croitoru, MD  Multiple Vitamins-Minerals (CENTRUM SILVER ADULT 50+) TABS Take by mouth.   Yes Historical Provider, MD  pravastatin (PRAVACHOL) 20 MG tablet Take 20 mg by mouth daily.   Yes Historical Provider, MD  nitroGLYCERIN (NITROSTAT) 0.4 MG SL tablet Place 1 tablet (0.4 mg total) under the tongue every 5 (five) minutes as needed for chest pain. 02/26/13   Erlene Quan, PA-C    Family History  Family History  Problem Relation Age of Onset  . Cancer Mother   . Heart disease Father   . Heart disease Sister   . Heart disease Brother     Social History  History   Social History  . Marital Status: Married    Spouse Name: N/A    Number of Children: N/A  . Years of Education: N/A   Occupational History  . Not on file.   Social History Main Topics  . Smoking status: Never Smoker   . Smokeless tobacco: Never Used  . Alcohol Use: No  . Drug Use: No  . Sexual Activity: Not on file   Other Topics Concern  . Not on file   Social History Narrative  . No narrative on file     Review of Systems General:  No chills, fever, night sweats or weight changes.  Cardiovascular:  No chest pain, dyspnea on exertion, edema, orthopnea, palpitations, paroxysmal nocturnal dyspnea. Dermatological: No rash, lesions/masses Respiratory: No cough, dyspnea Urologic: No hematuria, dysuria Abdominal:   No nausea, vomiting, diarrhea, bright red blood per rectum, melena, or hematemesis Neurologic:  No visual changes, wkns, changes in mental status. All other systems reviewed and are otherwise negative except as noted above.  Physical Exam  Blood pressure 185/70, pulse 67, temperature 97.8 F (36.6 C), temperature source Oral, resp. rate 18, height 5\' 5"  (1.651 m), weight 218 lb (98.884 kg), SpO2 97.00%.  General:  Pleasant, NAD Psych: Normal affect. Neuro: Alert and oriented X 3. Moves all extremities spontaneously. HEENT: Normal  Neck: Supple without bruits or JVD. Lungs:  Resp regular and unlabored, rales B/L. Heart: RRR no s3, s4, or murmurs. Abdomen: Soft, non-tender, non-distended, BS + x 4.  Extremities: No clubbing, cyanosis, +1 B/L edema. DP/PT/Radials 2+ and equal bilaterally.  Labs   Recent Labs  07/04/14 0923  TROPONINI <0.30   Lab Results  Component Value Date   WBC 7.6 07/04/2014   HGB 14.3 07/04/2014   HCT 42.6 07/04/2014   MCV 94.5 07/04/2014   PLT 186 07/04/2014    Recent Labs Lab 07/04/14 0923  NA 140  K 3.7  CL 101  CO2 27  BUN 21  CREATININE 1.08  CALCIUM 9.4  PROT 6.8  BILITOT 0.8  ALKPHOS 56  ALT 14  AST 20  GLUCOSE 89  Lab Results  Component Value Date   CHOL 162 04/18/2013   HDL 42 04/18/2013   LDLCALC 92 04/18/2013   TRIG 140 04/18/2013   Lab Results  Component Value Date   DDIMER 0.65* 07/04/2014   Radiology/Studies  Dg Chest Port 1 View  07/04/2014   CLINICAL DATA:  Shortness of breath with left-sided chest tightness beginning last night.  EXAM: PORTABLE CHEST - 1 VIEW  COMPARISON:  08/19/2013  FINDINGS: Lungs are adequately inflated with mild prominence of the perihilar markings suggesting a mild degree of vascular congestion. Borderline cardiomegaly. Fusion hardware over the lower cervical spine. Mild degenerative change of the spine. Minimal calcified plaque over the thoracic aorta.  IMPRESSION: Borderline cardiomegaly with suggestion of minimal vascular congestion.   Electronically Signed   By: Marin Olp M.D.   On: 07/04/2014 10:36   Echocardiogram - none  ECG - SR, non-specific T wave abnormality    ASSESSMENT AND PLAN  80 year old female   1. Acute on chronic diastolic CHF - secondary to hypertensive urgency  - 11 lbs weight gain since the last visit with Dr Sallyanne Kuster 218 lbs today, 207 in May 2015 -  no echo in epic, we will  order  - we will start Lasix 40 mg iv BID and monitor Crea closely, today 1.08, previously 1.5  2. Hypertensive urgency   - increase imdur to 60 mg po daily, adjust further as needed  3. CAD - no angina, unchanged ECG, negative enzymes  4. Dyslipidemia   - Fair lipid profile. Ideally LDL would be <70, but intolerant to more potent statins like atorvastatin and Crestor is too costly   Signed, Dorothy Spark, MD, [facility] 07/04/2014, 2:43 PM

## 2014-07-04 NOTE — ED Provider Notes (Signed)
CSN: [id number]     Arrival date & time 07/04/14  2831 History   First MD Initiated Contact with Patient 07/04/14 [phone]     Chief Complaint  Patient presents with  . Chest Pain  . Hypertension  . Shortness of Breath     (Consider location/radiation/quality/duration/timing/severity/associated sxs/prior Treatment) The history is provided by the patient. No language interpreter was used.  Jill Preston is a 80 y/o F with PMHx of chest pain, shortness of breath, edema to lower extremities bilaterally presenting to the ED with chest pain, shortness of breath that started during the night. Patient reported that while she was sleeping she felt short of breath, as if she was wheezing. Stated that when she was trying to sleep last night she was unable to completely lay flat secondary to feeling as if her chest is closing in on her. Reported that she has been having shortness of breath all morning worse with exertion. Stated that she has been feeling a pressure sensation to the center of her chest. Stated that on Thursday for received the pneumovax and reported that she has been having coughing - reported to be dry. Denied fever, chills, diaphoresis, travels, changes to leg swelling, abdominal pain, nausea, vomiting, dizziness, fainting. PCP Dr. Karlton Lemon Cardiology Dr. Sallyanne Kuster  Past Medical History  Diagnosis Date  . SOB (shortness of breath)     2D ECHO, 04/27/2010 - EF >55%, normal  . Chest pain, atypical     LEXISCAN, 08/29/2011 - anterior wall defect, partially reversible-suggestive of artifact and mild ischemia in LAD, post stress EF 68%, no ECG changes  . Edema of lower extremity     LEA VENOUS, 10/17/2011 - mild reflux in bilateral common femoral veins  . Hyperlipidemia    Past Surgical History  Procedure Laterality Date  . Cardiac catheterization Left 09/25/2011    Medical management  . Cardiac catheterization Left 03/25/2001    Normal LV function, LAD residual narrowing of less than 10%,  normal ramus intermediate, circumflex, and RCA,   . Cardiac catheterization  09/04/1999    LAD, 3x18mm Tetra stent resulting in a reduction of the 80% stenosis to 0% residual   Family History  Problem Relation Age of Onset  . Cancer Mother   . Heart disease Father   . Heart disease Sister   . Heart disease Brother    History  Substance Use Topics  . Smoking status: Never Smoker   . Smokeless tobacco: Never Used  . Alcohol Use: No   OB History   Grav Para Term Preterm Abortions TAB SAB Ect Mult Living                 Review of Systems  Constitutional: Negative for fever and chills.  Respiratory: Positive for cough and chest tightness.   Cardiovascular: Positive for chest pain and leg swelling (chronic).  Gastrointestinal: Negative for nausea, vomiting, abdominal pain and diarrhea.  Musculoskeletal: Negative for back pain and neck pain.  Neurological: Negative for dizziness, weakness and headaches.      Allergies  Atorvastatin; Latex; Shellfish allergy; Eggs or egg-derived products; Ciprofloxacin; Codeine; Contrast media; Gelnique; and Penicillins  Home Medications   Prior to Admission medications   Medication Sig Start Date End Date Taking? Authorizing Provider  aspirin EC 81 MG tablet Take 1 tablet (81 mg total) by mouth daily. 02/26/13  Yes Luke K Kilroy, PA-C  diclofenac sodium (VOLTAREN) 1 % GEL Apply 2 g topically 4 (four) times daily. pain   Yes  Historical Provider, MD  docusate sodium (COLACE) 50 MG capsule Take by mouth 2 (two) times daily.   Yes Historical Provider, MD  estradiol (ESTRACE) 0.1 MG/GM vaginal cream Place 2 g vaginally daily as needed. For urinary tract infection    Yes Historical Provider, MD  fenofibrate 160 MG tablet Take 160 mg by mouth daily.     Yes Historical Provider, MD  ibuprofen (ADVIL,MOTRIN) 200 MG tablet Take 200 mg by mouth every 6 (six) hours as needed for pain.   Yes Historical Provider, MD  isosorbide mononitrate (IMDUR) 30 MG 24 hr  tablet Take 2 tablets (60 mg total) by mouth 2 (two) times daily. 11/05/13 11/05/14 Yes Mihai Croitoru, MD  levothyroxine (SYNTHROID, LEVOTHROID) 125 MCG tablet Take 125 mcg by mouth daily before breakfast.   Yes Historical Provider, MD  losartan-hydrochlorothiazide (HYZAAR) 100-25 MG per tablet Take 0.5 tablets by mouth daily. 10/22/13  Yes Mihai Croitoru, MD  Multiple Vitamins-Minerals (CENTRUM SILVER ADULT 50+) TABS Take by mouth.   Yes Historical Provider, MD  pravastatin (PRAVACHOL) 20 MG tablet Take 20 mg by mouth daily.   Yes Historical Provider, MD  nitroGLYCERIN (NITROSTAT) 0.4 MG SL tablet Place 1 tablet (0.4 mg total) under the tongue every 5 (five) minutes as needed for chest pain. 02/26/13   Luke K Kilroy, PA-C   BP 164/70  Pulse 57  Temp(Src) 98.4 F (36.9 C) (Oral)  Resp 19  Ht 5\' 5"  (1.651 m)  Wt 218 lb (98.884 kg)  BMI 36.28 kg/m2  SpO2 95% Physical Exam  Nursing note and vitals reviewed. Constitutional: She is oriented to person, place, and time. She appears well-developed and well-nourished. No distress.  HENT:  Head: Normocephalic and atraumatic.  Mouth/Throat: Oropharynx is clear and moist. No oropharyngeal exudate.  Eyes: Conjunctivae and EOM are normal. Pupils are equal, round, and reactive to light. Right eye exhibits no discharge. Left eye exhibits no discharge.  Neck: Normal range of motion. Neck supple. No tracheal deviation present.  Cardiovascular: Normal rate, regular rhythm and normal heart sounds.  Exam reveals no friction rub.   No murmur heard. Pulses:      Radial pulses are 2+ on the right side, and 2+ on the left side.       Dorsalis pedis pulses are 2+ on the right side, and 2+ on the left side.  Cap refill < 3 seconds Negative swelling or pitting edema noted to the lower extremities bilaterally   Pulmonary/Chest: Effort normal. No respiratory distress. She has no wheezes. She has no rales. She exhibits no tenderness.  Patient is able to speak in full  sentences, but at the end of each sentence she does have to take a deep breath in  Negative use of accessory muscles Negative stridor  Musculoskeletal: Normal range of motion.  Full ROM to upper and lower extremities without difficulty noted, negative ataxia noted.  Lymphadenopathy:    She has no cervical adenopathy.  Neurological: She is alert and oriented to person, place, and time. No cranial nerve deficit. She exhibits normal muscle tone. Coordination normal.  Cranial nerves III-XII grossly intact Strength 5+/5+ to upper and lower extremities bilaterally with resistance applied, equal distribution noted Equal grip strength bilaterally  Negative facial drooping Negative slurred speech  Negative aphasia Negative arm drift Fine motor skills intact  Skin: Skin is warm and dry. No rash noted. She is not diaphoretic. No erythema.  Psychiatric: She has a normal mood and affect. Her behavior is normal. Thought content normal.  ED Course  Procedures (including critical care time)  Results for orders placed during the hospital encounter of 07/04/14  CBC WITH DIFFERENTIAL      Result Value Ref Range   WBC 7.6  4.0 - 10.5 K/uL   RBC 4.51  3.87 - 5.11 MIL/uL   Hemoglobin 14.3  12.0 - 15.0 g/dL   HCT 42.6  36.0 - 46.0 %   MCV 94.5  78.0 - 100.0 fL   MCH 31.7  26.0 - 34.0 pg   MCHC 33.6  30.0 - 36.0 g/dL   RDW 12.7  11.5 - 15.5 %   Platelets 186  150 - 400 K/uL   Neutrophils Relative % 64  43 - 77 %   Neutro Abs 4.9  1.7 - 7.7 K/uL   Lymphocytes Relative 21  12 - 46 %   Lymphs Abs 1.6  0.7 - 4.0 K/uL   Monocytes Relative 6  3 - 12 %   Monocytes Absolute 0.5  0.1 - 1.0 K/uL   Eosinophils Relative 8 (*) 0 - 5 %   Eosinophils Absolute 0.6  0.0 - 0.7 K/uL   Basophils Relative 1  0 - 1 %   Basophils Absolute 0.1  0.0 - 0.1 K/uL  COMPREHENSIVE METABOLIC PANEL      Result Value Ref Range   Sodium 140  137 - 147 mEq/L   Potassium 3.7  3.7 - 5.3 mEq/L   Chloride 101  96 - 112 mEq/L    CO2 27  19 - 32 mEq/L   Glucose, Bld 89  70 - 99 mg/dL   BUN 21  6 - 23 mg/dL   Creatinine, Ser 1.08  0.50 - 1.10 mg/dL   Calcium 9.4  8.4 - 10.5 mg/dL   Total Protein 6.8  6.0 - 8.3 g/dL   Albumin 3.7  3.5 - 5.2 g/dL   AST 20  0 - 37 U/L   ALT 14  0 - 35 U/L   Alkaline Phosphatase 56  39 - 117 U/L   Total Bilirubin 0.8  0.3 - 1.2 mg/dL   GFR calc non Af Amer 48 (*) >90 mL/min   GFR calc Af Amer 55 (*) >90 mL/min   Anion gap 12  5 - 15  TROPONIN I      Result Value Ref Range   Troponin I <0.30  <0.30 ng/mL  PRO B NATRIURETIC PEPTIDE      Result Value Ref Range   Pro B Natriuretic peptide (BNP) 872.5 (*) 0 - 450 pg/mL  PROTIME-INR      Result Value Ref Range   Prothrombin Time 14.5  11.6 - 15.2 seconds   INR 1.12  0.00 - 1.49  D-DIMER, QUANTITATIVE      Result Value Ref Range   D-Dimer, Quant 0.65 (*) 0.00 - 0.48 ug/mL-FEU    Labs Review Labs Reviewed  CBC WITH DIFFERENTIAL - Abnormal; Notable for the following:    Eosinophils Relative 8 (*)    All other components within normal limits  COMPREHENSIVE METABOLIC PANEL - Abnormal; Notable for the following:    GFR calc non Af Amer 48 (*)    GFR calc Af Amer 55 (*)    All other components within normal limits  PRO B NATRIURETIC PEPTIDE - Abnormal; Notable for the following:    Pro B Natriuretic peptide (BNP) 872.5 (*)    All other components within normal limits  D-DIMER, QUANTITATIVE - Abnormal; Notable for the following:  D-Dimer, Quant 0.65 (*)    All other components within normal limits  TROPONIN I  PROTIME-INR    Imaging Review Nm Pulmonary Perf And Vent  07/04/2014   CLINICAL DATA:  Elevated D-dimer, shortness of breath with left-sided chest tightness beginning last night, initial evaluation  EXAM: NUCLEAR MEDICINE VENTILATION - PERFUSION LUNG SCAN  TECHNIQUE: Ventilation images were obtained in multiple projections using inhaled aerosol technetium 99 M DTPA. Perfusion images were obtained in multiple  projections after intravenous injection of Tc-84m MAA.  RADIOPHARMACEUTICALS:  Forty mCi Tc-48m DTPA aerosol and 6 mCi Tc-54m MAA  COMPARISON:  07/04/2014 chest radiograph  FINDINGS: Ventilation: Tiny subsegmental right upper lobe defect. Tiny subsegmental left upper lobe defect.  Perfusion: Profusion imaging shows clumping of tracer centrally in both hilar regions. This suggests the possibility of air trapping which can be seen with COPD. Ventilation imaging is non back  IMPRESSION: Very low probability for pulmonary embolism. Nondiagnostic ventilation imaging.   Electronically Signed   By: Skipper Cliche M.D.   On: 07/04/2014 17:06   Dg Chest Port 1 View  07/04/2014   CLINICAL DATA:  Shortness of breath with left-sided chest tightness beginning last night.  EXAM: PORTABLE CHEST - 1 VIEW  COMPARISON:  08/19/2013  FINDINGS: Lungs are adequately inflated with mild prominence of the perihilar markings suggesting a mild degree of vascular congestion. Borderline cardiomegaly. Fusion hardware over the lower cervical spine. Mild degenerative change of the spine. Minimal calcified plaque over the thoracic aorta.  IMPRESSION: Borderline cardiomegaly with suggestion of minimal vascular congestion.   Electronically Signed   By: Marin Olp M.D.   On: 07/04/2014 10:36     EKG Interpretation   Date/Time:  Sunday July 04 2014 09:26:44 EDT Ventricular Rate:  68 PR Interval:  161 QRS Duration: 93 QT Interval:  427 QTC Calculation: 454 R Axis:   63 Text Interpretation:  Age not entered, assumed to be  80 years old for  purpose of ECG interpretation Sinus rhythm Nonspecific ST and T wave  abnormality Compared to prior, no longer sinus bradycardia Confirmed by  [facility]OFFORD  MD, TREY ([id number]) on 07/04/2014 9:37:34 AM      12 :17 PM Patient seen and assessed by attending physician, Dr. Maryanna Shape who agreed to admission. Recommended 40 mg IV lasix.  1:08 PM This provider spoke with Dr. Liane Comber, Cardiology -  discussed case, labs, imaging, vitals, ED course in great detail. Cardiologist to come and assess patient.   1:43 PM This provider re-assessed the patient. Patient reported that she is breathing more easily. Stated that the shortness of breath has improved.   55:32 PM This provider went to re-assess the patient. Patient doing well and breathing well. Discussed findings on VQ scan. Patient reported that she did not have a reaction to the medications given - denied chest pain, flushing, numbness, tingling, throat closing sensation. Negative signs of angioedema or respiratory distress.   MDM   Final diagnoses:  Shortness of breath  CHF exacerbation    Medications  isosorbide mononitrate (IMDUR) 24 hr tablet 60 mg (not administered)  furosemide (LASIX) injection 40 mg (40 mg Intravenous Given 07/04/14 1706)  guaiFENesin-codeine 100-10 MG/5ML solution 5 mL (5 mLs Oral Given 07/04/14 1554)  technetium TC 105M diethylenetriame-pentaacetic acid (DTPA) injection 40 milli Curie (not administered)  furosemide (LASIX) injection 40 mg (40 mg Intravenous Given 07/04/14 1259)  technetium albumin aggregated (MAA) injection solution 6 milli Curie (6 milli Curies Intravenous Contrast Given 07/04/14 1615)  Filed Vitals:   07/04/14 1245 07/04/14 1316 07/04/14 1324 07/04/14 1710  BP: 172/77 191/71 185/70 164/70  Pulse: 56 67  57  Temp:    98.4 F (36.9 C)  TempSrc:    Oral  Resp: 20 16 18 19   Height:      Weight:      SpO2: 96% 97% 97% 95%   This provider reviewed patient's chart. Patient has a stent placed in her LAD. Patient has had 3 cardiac catheterizations performed-2000, 2002, 2013. The latest cardiac catheterization reported identified calcification in the LAD, left circumflex artery and right coronary artery. EKG noted normal sinus rhythm with nonspecific ST and T wave abnormalities with a heart rate of 68 beats per minute. Troponin negative elevation. D-dimer elevated at 0.65. BNP mildly  elevated at 872.5. CBC and CMP unremarkable. INR unremarkable. Chest x-ray noted borderline cardiac megaly with suggestion of minimal vascular congestion. V/Q perfusion and ventilation very low probability for PE. Patient presenting to the ED with increased shortness of breath with difficulty breathing. Patient placed on nasal cannula - 2L/min to aid in breathing. Patient has elevated BNP. IV lasix given in the ED setting with improvement. Cardiomegaly noted on the chest xray - suspicion to be shortness of breath secondary to CHF exacerbation. Patient seen and assessed by Cardiology - patient to be admitted under the care of cardiology. Patient agreed to plan of admission, understood. Patient stable transferred.  Jamse Mead, PA-C 07/04/14 [place], PA-C 07/04/14 1755

## 2014-07-04 NOTE — ED Notes (Signed)
Attempted to call report. Secretary advised consulting w/Charge Nurse.

## 2014-07-05 ENCOUNTER — Encounter (HOSPITAL_COMMUNITY): Payer: Self-pay | Admitting: General Practice

## 2014-07-05 DIAGNOSIS — R0602 Shortness of breath: Secondary | ICD-10-CM | POA: Diagnosis present

## 2014-07-05 DIAGNOSIS — I2510 Atherosclerotic heart disease of native coronary artery without angina pectoris: Secondary | ICD-10-CM | POA: Diagnosis present

## 2014-07-05 DIAGNOSIS — Z9861 Coronary angioplasty status: Secondary | ICD-10-CM | POA: Diagnosis not present

## 2014-07-05 DIAGNOSIS — I130 Hypertensive heart and chronic kidney disease with heart failure and stage 1 through stage 4 chronic kidney disease, or unspecified chronic kidney disease: Secondary | ICD-10-CM | POA: Diagnosis present

## 2014-07-05 DIAGNOSIS — Z7982 Long term (current) use of aspirin: Secondary | ICD-10-CM | POA: Diagnosis not present

## 2014-07-05 DIAGNOSIS — N183 Chronic kidney disease, stage 3 (moderate): Secondary | ICD-10-CM | POA: Diagnosis present

## 2014-07-05 DIAGNOSIS — E669 Obesity, unspecified: Secondary | ICD-10-CM | POA: Diagnosis present

## 2014-07-05 DIAGNOSIS — E785 Hyperlipidemia, unspecified: Secondary | ICD-10-CM | POA: Diagnosis present

## 2014-07-05 DIAGNOSIS — I5033 Acute on chronic diastolic (congestive) heart failure: Secondary | ICD-10-CM | POA: Diagnosis present

## 2014-07-05 DIAGNOSIS — I059 Rheumatic mitral valve disease, unspecified: Secondary | ICD-10-CM

## 2014-07-05 DIAGNOSIS — I872 Venous insufficiency (chronic) (peripheral): Secondary | ICD-10-CM | POA: Diagnosis present

## 2014-07-05 DIAGNOSIS — E039 Hypothyroidism, unspecified: Secondary | ICD-10-CM | POA: Diagnosis present

## 2014-07-05 DIAGNOSIS — Z79899 Other long term (current) drug therapy: Secondary | ICD-10-CM | POA: Diagnosis not present

## 2014-07-05 DIAGNOSIS — E876 Hypokalemia: Secondary | ICD-10-CM | POA: Diagnosis present

## 2014-07-05 DIAGNOSIS — Z6835 Body mass index (BMI) 35.0-35.9, adult: Secondary | ICD-10-CM | POA: Diagnosis not present

## 2014-07-05 LAB — BASIC METABOLIC PANEL
Anion gap: 14 (ref 5–15)
BUN: 22 mg/dL (ref 6–23)
CO2: 32 mEq/L (ref 19–32)
Calcium: 9.6 mg/dL (ref 8.4–10.5)
Chloride: 94 mEq/L — ABNORMAL LOW (ref 96–112)
Creatinine, Ser: 1.33 mg/dL — ABNORMAL HIGH (ref 0.50–1.10)
GFR calc Af Amer: 43 mL/min — ABNORMAL LOW (ref >90)
GFR calc non Af Amer: 37 mL/min — ABNORMAL LOW (ref >90)
Glucose, Bld: 94 mg/dL (ref 70–99)
Potassium: 3.5 mEq/L — ABNORMAL LOW (ref 3.7–5.3)
Sodium: 140 mEq/L (ref 137–147)

## 2014-07-05 NOTE — Progress Notes (Signed)
SUBJECTIVE:  She thinks that she is not quite as tight as she has been.  Still not breathing at baseline.    PHYSICAL EXAM Filed Vitals:   07/04/14 1710 07/04/14 1814 07/04/14 2050 07/05/14 0613  BP: 164/70 174/76 153/65 118/55  Pulse: 57 57 54 51  Temp: 98.4 F (36.9 C) 98.2 F (36.8 C) 97.6 F (36.4 C) 97.8 F (36.6 C)  TempSrc: Oral Oral Oral Oral  Resp: 19 19 18 17   Height:  5\' 5"  (1.651 m)    Weight:  213 lb 6.4 oz (96.798 kg)  212 lb 4.9 oz (96.3 kg)  SpO2: 95% 99% 98% 95%   General:  No distress Lungs:  Few basilar crackles Heart:  RRR Abdomen:  Positive bowel sounds, no rebound no guarding Extremities:  Mild edema   LABS: Lab Results  Component Value Date   TROPONINI <0.30 07/04/2014   Results for orders placed during the hospital encounter of 07/04/14 (from the past 24 hour(s))  CBC WITH DIFFERENTIAL     Status: Abnormal   Collection Time    07/04/14  9:23 AM      Result Value Ref Range   WBC 7.6  4.0 - 10.5 K/uL   RBC 4.51  3.87 - 5.11 MIL/uL   Hemoglobin 14.3  12.0 - 15.0 g/dL   HCT 42.6  36.0 - 46.0 %   MCV 94.5  78.0 - 100.0 fL   MCH 31.7  26.0 - 34.0 pg   MCHC 33.6  30.0 - 36.0 g/dL   RDW 12.7  11.5 - 15.5 %   Platelets 186  150 - 400 K/uL   Neutrophils Relative % 64  43 - 77 %   Neutro Abs 4.9  1.7 - 7.7 K/uL   Lymphocytes Relative 21  12 - 46 %   Lymphs Abs 1.6  0.7 - 4.0 K/uL   Monocytes Relative 6  3 - 12 %   Monocytes Absolute 0.5  0.1 - 1.0 K/uL   Eosinophils Relative 8 (*) 0 - 5 %   Eosinophils Absolute 0.6  0.0 - 0.7 K/uL   Basophils Relative 1  0 - 1 %   Basophils Absolute 0.1  0.0 - 0.1 K/uL  COMPREHENSIVE METABOLIC PANEL     Status: Abnormal   Collection Time    07/04/14  9:23 AM      Result Value Ref Range   Sodium 140  137 - 147 mEq/L   Potassium 3.7  3.7 - 5.3 mEq/L   Chloride 101  96 - 112 mEq/L   CO2 27  19 - 32 mEq/L   Glucose, Bld 89  70 - 99 mg/dL   BUN 21  6 - 23 mg/dL   Creatinine, Ser 1.08  0.50 - 1.10 mg/dL   Calcium 9.4  8.4 - 10.5 mg/dL   Total Protein 6.8  6.0 - 8.3 g/dL   Albumin 3.7  3.5 - 5.2 g/dL   AST 20  0 - 37 U/L   ALT 14  0 - 35 U/L   Alkaline Phosphatase 56  39 - 117 U/L   Total Bilirubin 0.8  0.3 - 1.2 mg/dL   GFR calc non Af Amer 48 (*) >90 mL/min   GFR calc Af Amer 55 (*) >90 mL/min   Anion gap 12  5 - 15  TROPONIN I     Status: None   Collection Time    07/04/14  9:23 AM      Result  Value Ref Range   Troponin I <0.30  <0.30 ng/mL  PROTIME-INR     Status: None   Collection Time    07/04/14  9:23 AM      Result Value Ref Range   Prothrombin Time 14.5  11.6 - 15.2 seconds   INR 1.12  0.00 - 1.49  D-DIMER, QUANTITATIVE     Status: Abnormal   Collection Time    07/04/14  9:23 AM      Result Value Ref Range   D-Dimer, Quant 0.65 (*) 0.00 - 0.48 ug/mL-FEU  PRO B NATRIURETIC PEPTIDE     Status: Abnormal   Collection Time    07/04/14  9:44 AM      Result Value Ref Range   Pro B Natriuretic peptide (BNP) 872.5 (*) 0 - 450 pg/mL  BASIC METABOLIC PANEL     Status: Abnormal   Collection Time    07/05/14  4:00 AM      Result Value Ref Range   Sodium 140  137 - 147 mEq/L   Potassium 3.5 (*) 3.7 - 5.3 mEq/L   Chloride 94 (*) 96 - 112 mEq/L   CO2 32  19 - 32 mEq/L   Glucose, Bld 94  70 - 99 mg/dL   BUN 22  6 - 23 mg/dL   Creatinine, Ser 1.33 (*) 0.50 - 1.10 mg/dL   Calcium 9.6  8.4 - 10.5 mg/dL   GFR calc non Af Amer 37 (*) >90 mL/min   GFR calc Af Amer 43 (*) >90 mL/min   Anion gap 14  5 - 15    Intake/Output Summary (Last 24 hours) at 07/05/14 0759 Last data filed at 07/05/14 9024  Gross per 24 hour  Intake      0 ml  Output   2520 ml  Net  -2520 ml    ASSESSMENT AND PLAN:  CAD:  No objective evidence of ischemia.    ACUTE DIASTOLIC HF:    Echo pending.  Good urine out put.  Creat is up slightly.  I will however, continue current diuresis IV and check the BMET daily.    CKD III:  As above.    HTN:   BP has come down.   Continue current meds.     Jeneen Rinks  [facility] 07/05/2014 7:59 AM

## 2014-07-05 NOTE — Progress Notes (Signed)
UR completed 

## 2014-07-05 NOTE — Evaluation (Signed)
Physical Therapy Evaluation Patient Details Name: Jill Preston MRN: [id number] DOB: [birth year] Today's Date: 07/05/2014   History of Present Illness  Patient is a 80 y/o female admitted with SOB and HTN urgency. PMH of CAD, s/p PCI/ BMS to proximal LAD and balloon angioplasty to mid LAD with suboptimal results in 2000, obesity, HTN, dyslipidemia, venous insufficiency and CKD stage III.    Clinical Impression  Patient presents close to functional baseline as pt tolerated ambulating community distances and negotiating steps without LOB or difficulty. Mild SOB present with drop in Sa02 on RA however O2 sats resolved quickly during rest. Pt does not require skilled therapy services at this time. Encourage daily ambulation to improve uptake and utilization of 02 while in hospital. Will sign off for now. Thanks for the consult.    Follow Up Recommendations No PT follow up    Equipment Recommendations  None recommended by PT    Recommendations for Other Services       Precautions / Restrictions Restrictions Weight Bearing Restrictions: No      Mobility  Bed Mobility               General bed mobility comments: Received sitting in chair upon PT arrival.   Transfers Overall transfer level: Independent                  Ambulation/Gait Ambulation/Gait assistance: Independent   Assistive device: None Gait Pattern/deviations: Step-through pattern   Gait velocity interpretation: at or above normal speed for age/gender General Gait Details: Steady gait. Mild dyspnea present. Sa02 decreased to 87% however resolved quickly during rest.  Stairs Stairs: Yes Stairs assistance: Modified independent (Device/Increase time) Stair Management: One rail Right;Alternating pattern Number of Stairs: 11 General stair comments: Safe technique.  Wheelchair Mobility    Modified Rankin (Stroke Patients Only)       Balance Overall balance assessment: Independent                                            Pertinent Vitals/Pain Pain Assessment: No/denies pain    Home Living Family/patient expects to be discharged to:: Private residence Living Arrangements: Spouse/significant other Available Help at Discharge: Family;Available 24 hours/day Type of Home: House Home Access: Stairs to enter Entrance Stairs-Rails: Right Entrance Stairs-Number of Steps: 7 Home Layout: One level Home Equipment: None      Prior Function Level of Independence: Independent               Hand Dominance        Extremity/Trunk Assessment   Upper Extremity Assessment: Overall WFL for tasks assessed;RUE deficits/detail RUE Deficits / Details: Numbness in right hand that sometimes radiates up to shoulder.         Lower Extremity Assessment: Overall WFL for tasks assessed         Communication   Communication: No difficulties  Cognition Arousal/Alertness: Awake/alert Behavior During Therapy: WFL for tasks assessed/performed Overall Cognitive Status: Within Functional Limits for tasks assessed                      General Comments      Exercises        Assessment/Plan    PT Assessment Patent does not need any further PT services  PT Diagnosis Difficulty walking   PT Problem List    PT Treatment Interventions  PT Goals (Current goals can be found in the Care Plan section) Acute Rehab PT Goals Patient Stated Goal: to breathe better PT Goal Formulation: All assessment and education complete, DC therapy    Frequency     Barriers to discharge        Co-evaluation               End of Session Equipment Utilized During Treatment: Gait belt Activity Tolerance: Patient tolerated treatment well Patient left: in bed;with call bell/phone within reach;with family/visitor present Nurse Communication: Mobility status         Time: 9485-4627 PT Time Calculation (min): 15 min   Charges:   PT Evaluation $Initial  PT Evaluation Tier I: 1 Procedure PT Treatments $Therapeutic Exercise: 8-22 mins   PT G CodesCandy Sledge A 07/05/2014, 4:29 PM Candy Sledge, [place], DPT [phone]

## 2014-07-05 NOTE — ED Provider Notes (Signed)
Medical screening examination/treatment/procedure(s) were conducted as a shared visit with non-physician practitioner(s) and myself.  I personally evaluated the patient during the encounter.   EKG Interpretation   Date/Time:  Sunday July 04 2014 09:26:44 EDT Ventricular Rate:  68 PR Interval:  161 QRS Duration: 93 QT Interval:  427 QTC Calculation: 454 R Axis:   63 Text Interpretation:  Age not entered, assumed to be  80 years old for  purpose of ECG interpretation Sinus rhythm Nonspecific ST and T wave  abnormality Compared to prior, no longer sinus bradycardia Confirmed by  [facility]  MD, TREY ([id number]) on 07/04/2014 9:37:34 AM        Artis Delay, MD 07/05/14 1356

## 2014-07-05 NOTE — Plan of Care (Signed)
Problem: Phase I Progression Outcomes Goal: EF % per last Echo/documented,Core Reminder form on chart Outcome: Completed/Met Date Met:  07/05/14 EF >68% as of 2012

## 2014-07-05 NOTE — Plan of Care (Signed)
Problem: Phase I Progression Outcomes Goal: Up in chair, BRP Outcome: Completed/Met Date Met:  07/05/14 Pt up in room ad lib, pt state she walked in hallway with family members and PT today

## 2014-07-05 NOTE — ED Provider Notes (Signed)
Medical screening examination/treatment/procedure(s) were conducted as a shared visit with non-physician practitioner(s) and myself.  I personally evaluated the patient during the encounter.   EKG Interpretation   Date/Time:  Sunday July 04 2014 09:26:44 EDT Ventricular Rate:  68 PR Interval:  161 QRS Duration: 93 QT Interval:  427 QTC Calculation: 454 R Axis:   63 Text Interpretation:  Age not entered, assumed to be  80 years old for  purpose of ECG interpretation Sinus rhythm Nonspecific ST and T wave  abnormality Compared to prior, no longer sinus bradycardia Confirmed by  Simrin Vegh  MD, TREY ([id number]) on 07/04/2014 9:37:34 AM      80  yo female presenting with chest pain and shortness of breath.  On exam, nontoxic, but in moderate respiratory distressed, tachypneic, speaking in short sentences, bibasilar rales, heart sounds normal with RRR.  Plan admit to cardiology.    Clinical Impression: 1. Shortness of breath   2. CHF exacerbation   3. Hypertensive urgency   4. Acute on chronic diastolic CHF (congestive heart failure), NYHA class 3   5. Coronary artery disease involving native coronary artery of native heart without angina pectoris   6. Dyslipidemia        Artis Delay, MD 07/05/14 1356

## 2014-07-05 NOTE — Progress Notes (Signed)
Report Given to receiving RN. Patient in bed resting. No verbal complaints.

## 2014-07-05 NOTE — Progress Notes (Signed)
  Echocardiogram 2D Echocardiogram has been performed.  Jill Preston [name] 07/05/2014, 10:09 AM

## 2014-07-06 LAB — BASIC METABOLIC PANEL
Anion gap: 11 (ref 5–15)
BUN: 27 mg/dL — ABNORMAL HIGH (ref 6–23)
CO2: 36 mEq/L — ABNORMAL HIGH (ref 19–32)
Calcium: 10 mg/dL (ref 8.4–10.5)
Chloride: 93 mEq/L — ABNORMAL LOW (ref 96–112)
Creatinine, Ser: 1.38 mg/dL — ABNORMAL HIGH (ref 0.50–1.10)
GFR calc Af Amer: 41 mL/min — ABNORMAL LOW (ref >90)
GFR calc non Af Amer: 36 mL/min — ABNORMAL LOW (ref >90)
Glucose, Bld: 105 mg/dL — ABNORMAL HIGH (ref 70–99)
Potassium: 3.6 mEq/L — ABNORMAL LOW (ref 3.7–5.3)
Sodium: 140 mEq/L (ref 137–147)

## 2014-07-06 MED ORDER — FUROSEMIDE 40 MG PO TABS
40.0000 mg | ORAL_TABLET | Freq: Two times a day (BID) | ORAL | Status: DC
  Administered 2014-07-06 – 2014-07-08 (×4): 40 mg via ORAL
  Filled 2014-07-06 (×6): qty 1

## 2014-07-06 NOTE — Progress Notes (Signed)
Report Given to receiving RN. Patient sitting in chair with family at bedside. No verbal complaints.

## 2014-07-06 NOTE — Progress Notes (Signed)
Pt removed oxygen to sit in chair. O2 sat 89% on RA. Pt placed on 2L [place] to keep O2 greater than 92% per order

## 2014-07-06 NOTE — Plan of Care (Signed)
Problem: Phase II Progression Outcomes Goal: Walk in hall or up in chair TID Outcome: Completed/Met Date Met:  07/06/14 Patient ambulating in hallway.

## 2014-07-06 NOTE — Progress Notes (Signed)
Patient Name: Jill Preston Date of Encounter: 07/06/2014  Primary Cardiologist: Dr Sallyanne Kuster, Dani Gobble    Principal Problem:   Acute on chronic diastolic HF (heart failure) Active Problems:   Hypothyroidism   Morbid obesity   HTN (hypertension)   CAD LAD stent 12/00. Patent '02 and 1/13   Dyslipidemia   Venous insufficiency   CKD (chronic kidney disease)    SUBJECTIVE  Denies any significant SOB. Mild L sided chest discomfort around 8am today, lasted less than 1 min, resolved, no recurrence  CURRENT MEDS . aspirin EC  81 mg Oral Daily  . fenofibrate  160 mg Oral Daily  . furosemide  40 mg Intravenous BID  . isosorbide mononitrate  60 mg Oral Daily  . levothyroxine  125 mcg Oral QAC breakfast  . losartan  100 mg Oral Daily  . pravastatin  20 mg Oral Daily  . sodium chloride  3 mL Intravenous Q12H    OBJECTIVE  Filed Vitals:   07/06/14 0210 07/06/14 0524 07/06/14 0603 07/06/14 0606  BP: 119/52 130/46    Pulse: 60 61    Temp: 98.2 F (36.8 C) 98.2 F (36.8 C)    TempSrc: Oral Oral    Resp: 18 18    Height:      Weight:  210 lb 1.6 oz (95.3 kg)    SpO2: 95% 92% 89% 93%    Intake/Output Summary (Last 24 hours) at 07/06/14 0907 Last data filed at 07/06/14 0903  Gross per 24 hour  Intake   1200 ml  Output   2300 ml  Net  -1100 ml   Filed Weights   07/04/14 1814 07/05/14 0613 07/06/14 0524  Weight: 213 lb 6.4 oz (96.798 kg) 212 lb 4.9 oz (96.3 kg) 210 lb 1.6 oz (95.3 kg)    PHYSICAL EXAM  General: Pleasant, NAD. Neuro: Alert and oriented X 3. Moves all extremities spontaneously. Psych: Normal affect. HEENT:  Normal  Neck: Supple without bruits or JVD. Lungs:  Resp regular and unlabored, mild bibasilar rale Heart: RRR no s3, s4, or murmurs. Abdomen: Soft, non-tender, non-distended, BS + x 4.  Extremities: No clubbing, cyanosis or edema. DP/PT/Radials 2+ and equal bilaterally.  Accessory Clinical Findings  CBC  Recent Labs  07/04/14 0923  WBC  7.6  NEUTROABS 4.9  HGB 14.3  HCT 42.6  MCV 94.5  PLT 563   Basic Metabolic Panel  Recent Labs  07/05/14 0400 07/06/14 0524  NA 140 140  K 3.5* 3.6*  CL 94* 93*  CO2 32 36*  GLUCOSE 94 105*  BUN 22 27*  CREATININE 1.33* 1.38*  CALCIUM 9.6 10.0   Liver Function Tests  Recent Labs  07/04/14 0923  AST 20  ALT 14  ALKPHOS 56  BILITOT 0.8  PROT 6.8  ALBUMIN 3.7   Cardiac Enzymes  Recent Labs  07/04/14 0923  TROPONINI <0.30   D-Dimer  Recent Labs  07/04/14 0923  DDIMER 0.65*    TELE NSR with HR 40-60s    ECG  No new EKG  Echocardiogram 07/05/2014  LV EF: 65% - 70%  ------------------------------------------------------------------- Indications: CHF - 428.0.  ------------------------------------------------------------------- History: PMH: Chest pain. Coronary artery disease. Risk factors: Hypertension. Dyslipidemia.  ------------------------------------------------------------------- Study Conclusions  - Left ventricle: Systolic function was vigorous. The estimated ejection fraction was in the range of 65% to 70%. - Mitral valve: There was mild regurgitation.      Radiology/Studies  Nm Pulmonary Perf And Vent  07/04/2014   CLINICAL DATA:  Elevated D-dimer, shortness of breath with left-sided chest tightness beginning last night, initial evaluation  EXAM: NUCLEAR MEDICINE VENTILATION - PERFUSION LUNG SCAN  TECHNIQUE: Ventilation images were obtained in multiple projections using inhaled aerosol technetium 99 M DTPA. Perfusion images were obtained in multiple projections after intravenous injection of Tc-72m MAA.  RADIOPHARMACEUTICALS:  Forty mCi Tc-26m DTPA aerosol and 6 mCi Tc-55m MAA  COMPARISON:  07/04/2014 chest radiograph  FINDINGS: Ventilation: Tiny subsegmental right upper lobe defect. Tiny subsegmental left upper lobe defect.  Perfusion: Profusion imaging shows clumping of tracer centrally in both hilar regions. This suggests the  possibility of air trapping which can be seen with COPD. Ventilation imaging is non back  IMPRESSION: Very low probability for pulmonary embolism. Nondiagnostic ventilation imaging.   Electronically Signed   By: Skipper Cliche M.D.   On: 07/04/2014 17:06   Dg Chest Port 1 View  07/04/2014   CLINICAL DATA:  Shortness of breath with left-sided chest tightness beginning last night.  EXAM: PORTABLE CHEST - 1 VIEW  COMPARISON:  08/19/2013  FINDINGS: Lungs are adequately inflated with mild prominence of the perihilar markings suggesting a mild degree of vascular congestion. Borderline cardiomegaly. Fusion hardware over the lower cervical spine. Mild degenerative change of the spine. Minimal calcified plaque over the thoracic aorta.  IMPRESSION: Borderline cardiomegaly with suggestion of minimal vascular congestion.   Electronically Signed   By: Marin Olp M.D.   On: 07/04/2014 10:36    ASSESSMENT AND PLAN  1. Acute on chronic diastolic HF in the setting of malignant hypertension  - 11 lbs weight gain since the last visit with Dr Sallyanne Kuster 218 lbs today, 207 in May 2015  - V/Q scan negative for PE  - Echo 07/05/2014 EF 65-70%, mild MR  - -4L while on 40mg  IV lasix BID, still has mild intermittent rale near bases, likely can switch to oral diuretic either this afternoon or tomorrow morning. Problem is she had bladder surgery in 2010 which resulted in some degree of urinary incontinence which is why she was only on HCTZ before. She soaked 2 pads yesterday and fear oral lasix at home. ?if we can still do HCTZ with PRN lasix as needed based on weight gain and symptom.  - likely discharge tomorrow  2. CAD   - PCI/ BMS to proximal LAD and balloon angioplasty to mid LAD with suboptimal results in 2000  - no angina or EKG changes, serial enzyme negative  3. HTN: uncontrollled  - SBP >200 on arrival  4. Dyslipidemia  Signed, Almyra Deforest PA-C Pager: [phone]   History and all data above reviewed.   Patient examined.  I agree with the findings as above. No chest pain.  No SOB breathing at baseline  The patient exam reveals COR:RRR  ,  Lungs: Clear  ,  Abd: Positive bowel sounds, no rebound no guarding, Ext Mild edema  .  All available labs, radiology testing, previous records reviewed. Agree with documented assessment and plan. Acute on chronic HF:  Change to oral diuretic this PM and probably home in the AM.  She will at least need PRN Lasix at home. I discussed with her how we would dose that.   Jeneen Rinks Arman Loy  12:30 PM  07/06/2014

## 2014-07-07 LAB — BASIC METABOLIC PANEL
Anion gap: 12 (ref 5–15)
BUN: 29 mg/dL — ABNORMAL HIGH (ref 6–23)
CO2: 33 mEq/L — ABNORMAL HIGH (ref 19–32)
Calcium: 9.9 mg/dL (ref 8.4–10.5)
Chloride: 93 mEq/L — ABNORMAL LOW (ref 96–112)
Creatinine, Ser: 1.36 mg/dL — ABNORMAL HIGH (ref 0.50–1.10)
GFR calc Af Amer: 42 mL/min — ABNORMAL LOW (ref >90)
GFR calc non Af Amer: 36 mL/min — ABNORMAL LOW (ref >90)
Glucose, Bld: 116 mg/dL — ABNORMAL HIGH (ref 70–99)
Potassium: 3.4 mEq/L — ABNORMAL LOW (ref 3.7–5.3)
Sodium: 138 mEq/L (ref 137–147)

## 2014-07-07 MED ORDER — FUROSEMIDE 10 MG/ML IJ SOLN
40.0000 mg | Freq: Every day | INTRAMUSCULAR | Status: DC
  Administered 2014-07-07: 40 mg via INTRAVENOUS
  Filled 2014-07-07: qty 4

## 2014-07-07 MED ORDER — POTASSIUM CHLORIDE CRYS ER 20 MEQ PO TBCR
40.0000 meq | EXTENDED_RELEASE_TABLET | Freq: Once | ORAL | Status: AC
  Administered 2014-07-07: 40 meq via ORAL
  Filled 2014-07-07: qty 2

## 2014-07-07 MED ORDER — FUROSEMIDE 10 MG/ML IJ SOLN
INTRAMUSCULAR | Status: AC
  Filled 2014-07-07: qty 4

## 2014-07-07 NOTE — Progress Notes (Signed)
Report Given to receiving RN. Patient in bed resting. No verbal complaints.

## 2014-07-07 NOTE — Progress Notes (Signed)
Subjective: Breathing much better  Objective: Vital signs in last 24 hours: Temp:  [98.1 F (36.7 C)-98.5 F (36.9 C)] 98.4 F (36.9 C) (10/21 0531) Pulse Rate:  [59-68] 59 (10/21 0531) Resp:  [18-20] 18 (10/21 0531) BP: (105-153)/(47-68) 133/52 mmHg (10/21 0531) SpO2:  [96 %-97 %] 97 % (10/21 0531) Weight:  [210 lb 1.6 oz (95.3 kg)] 210 lb 1.6 oz (95.3 kg) (10/21 0531) Last BM Date: 07/04/14  Intake/Output from previous day: 10/20 0701 - 10/21 0700 In: 960 [P.O.:960] Out: 2365 [Urine:2365] Intake/Output this shift:    Medications Current Facility-Administered Medications  Medication Dose Route Frequency Provider Last Rate Last Dose  . 0.9 %  sodium chloride infusion  250 mL Intravenous PRN Brittainy Erie Noe, PA-C      . acetaminophen (TYLENOL) tablet 650 mg  650 mg Oral Q4H PRN Consuelo Pandy, PA-C   650 mg at 07/05/14 0333  . aspirin EC tablet 81 mg  81 mg Oral Daily Brittainy Erie Noe, PA-C   81 mg at 07/06/14 1012  . fenofibrate tablet 160 mg  160 mg Oral Daily Consuelo Pandy, PA-C   160 mg at 07/06/14 1012  . furosemide (LASIX) tablet 40 mg  40 mg Oral BID Minus Breeding, MD   40 mg at 07/06/14 1840  . guaiFENesin-codeine 100-10 MG/5ML solution 5 mL  5 mL Oral Q4H PRN Dorothy Spark, MD   5 mL at 07/04/14 1554  . isosorbide mononitrate (IMDUR) 24 hr tablet 60 mg  60 mg Oral Daily Dorothy Spark, MD   60 mg at 07/06/14 1012  . levothyroxine (SYNTHROID, LEVOTHROID) tablet 125 mcg  125 mcg Oral QAC breakfast Consuelo Pandy, PA-C   125 mcg at 07/07/14 0539  . losartan (COZAAR) tablet 100 mg  100 mg Oral Daily Consuelo Pandy, PA-C   100 mg at 07/06/14 1012  . nitroGLYCERIN (NITROSTAT) SL tablet 0.4 mg  0.4 mg Sublingual Q5 min PRN Brittainy M Simmons, PA-C      . ondansetron [facility]) injection 4 mg  4 mg Intravenous Q6H PRN Brittainy Erie Noe, PA-C      . pravastatin (PRAVACHOL) tablet 20 mg  20 mg Oral Daily Brittainy Erie Noe, PA-C   20 mg  at 07/06/14 1012  . sodium chloride 0.9 % injection 3 mL  3 mL Intravenous Q12H Brittainy Erie Noe, PA-C   3 mL at 07/06/14 2028  . sodium chloride 0.9 % injection 3 mL  3 mL Intravenous PRN Brittainy Erie Noe, PA-C        PE: General appearance: alert, cooperative and no distress Lungs: mild coarse crackles at the bases.  No wheeze Heart: regular rate and rhythm, S1, S2 normal, no murmur, click, rub or gallop Abdomen: +BS.  soft, nontender Extremities: No LEE Pulses: 2+ and symmetric Skin: Warm and dry Neurologic: Grossly normal  Lab Results:   Recent Labs  07/04/14 0923  WBC 7.6  HGB 14.3  HCT 42.6  PLT 186   BMET  Recent Labs  07/05/14 0400 07/06/14 0524 07/07/14 0420  NA 140 140 138  K 3.5* 3.6* 3.4*  CL 94* 93* 93*  CO2 32 36* 33*  GLUCOSE 94 105* 116*  BUN 22 27* 29*  CREATININE 1.33* 1.38* 1.36*  CALCIUM 9.6 10.0 9.9   PT/INR  Recent Labs  07/04/14 0923  LABPROT 14.5  INR 1.12    Assessment/Plan  Active Problems:   Acute on chronic diastolic HF (heart failure)  11 lbs weight gain since the last visit with Dr Sallyanne Kuster 218 lbs today, 207 in May 2015. Net fluids: -14L/-5.7L.  V/Q scan negative for PE.  Echo 07/05/2014 EF 65-70%, mild MR.    Lasix at 40mg  BID PO.  Plan for PRN lasix with daily weight monitoring. Discussed again. BUN/SCr slighly up.        Hypokalemia  Give K this morning.     HTN (hypertension)  BP stable. Imdur 60, cozaar 100.      Hypothyroidism   Morbid obesity   CAD LAD stent 12/00. Patent '02 and 1/13   Dyslipidemia  On a statin   Venous insufficiency   CKD (chronic kidney disease)    LOS: 3 days    HAGER, BRYAN PA-C 07/07/2014 7:50 AM  History and all data above reviewed.  Increased cough last night.  Still somewhat SOB. Patient examined.  I agree with the findings as above.  The patient exam reveals COR:RRR  ,  Lungs: Bilateral left greater than right crackles  ,  Abd: Positive bowel sounds, no rebound no  guarding, Ext Trace edema  .  All available labs, radiology testing, previous records reviewed. Agree with documented assessment and plan. Seems to have increased volume today.  I will restart IV Lasix.  Not home today.  She will likely need more than PRN Lasix.   Jeneen Rinks Zandyr Barnhill  10:16 AM  07/07/2014

## 2014-07-07 NOTE — Progress Notes (Signed)
SATURATION QUALIFICATIONS: (This note is used to comply with regulatory documentation for home oxygen)  Patient Saturations on Room Air at Rest = 96%  Patient Saturations on Room Air while Ambulating = 92-93%

## 2014-07-07 NOTE — Plan of Care (Signed)
Problem: Phase III Progression Outcomes Goal: Activity at appropriate level-compared to baseline (UP IN CHAIR FOR HEMODIALYSIS)  Outcome: Adequate for Discharge Patient ambulated in hallway. O2 Sats maintained within normal limits.

## 2014-07-08 ENCOUNTER — Encounter (HOSPITAL_COMMUNITY): Payer: Self-pay | Admitting: Physician Assistant

## 2014-07-08 ENCOUNTER — Other Ambulatory Visit: Payer: Self-pay | Admitting: Physician Assistant

## 2014-07-08 DIAGNOSIS — N183 Chronic kidney disease, stage 3 unspecified: Secondary | ICD-10-CM | POA: Diagnosis present

## 2014-07-08 DIAGNOSIS — I872 Venous insufficiency (chronic) (peripheral): Secondary | ICD-10-CM | POA: Insufficient documentation

## 2014-07-08 DIAGNOSIS — IMO0001 Reserved for inherently not codable concepts without codable children: Secondary | ICD-10-CM | POA: Diagnosis present

## 2014-07-08 DIAGNOSIS — I10 Essential (primary) hypertension: Secondary | ICD-10-CM | POA: Insufficient documentation

## 2014-07-08 DIAGNOSIS — I119 Hypertensive heart disease without heart failure: Secondary | ICD-10-CM | POA: Diagnosis present

## 2014-07-08 LAB — BASIC METABOLIC PANEL
Anion gap: 12 (ref 5–15)
BUN: 35 mg/dL — ABNORMAL HIGH (ref 6–23)
CO2: 31 mEq/L (ref 19–32)
Calcium: 10.2 mg/dL (ref 8.4–10.5)
Chloride: 94 mEq/L — ABNORMAL LOW (ref 96–112)
Creatinine, Ser: 1.68 mg/dL — ABNORMAL HIGH (ref 0.50–1.10)
GFR calc Af Amer: 33 mL/min — ABNORMAL LOW (ref >90)
GFR calc non Af Amer: 28 mL/min — ABNORMAL LOW (ref >90)
Glucose, Bld: 106 mg/dL — ABNORMAL HIGH (ref 70–99)
Potassium: 4.1 mEq/L (ref 3.7–5.3)
Sodium: 137 mEq/L (ref 137–147)

## 2014-07-08 MED ORDER — LOSARTAN POTASSIUM 100 MG PO TABS: 100.0000 mg | ORAL_TABLET | Freq: Every day | ORAL | Status: DC

## 2014-07-08 MED ORDER — FUROSEMIDE 20 MG PO TABS: 20.0000 mg | ORAL_TABLET | Freq: Every day | ORAL | Status: DC

## 2014-07-08 MED ORDER — ISOSORBIDE MONONITRATE ER 60 MG PO TB24
60.0000 mg | ORAL_TABLET | Freq: Every day | ORAL | Status: DC
Start: 2014-07-08 — End: 2015-11-22

## 2014-07-08 MED ORDER — FUROSEMIDE 20 MG PO TABS
20.0000 mg | ORAL_TABLET | Freq: Every day | ORAL | Status: DC
Start: 2014-07-09 — End: 2014-08-18

## 2014-07-08 NOTE — Progress Notes (Signed)
    SUBJECTIVE:  Breathing better.  No SOB.  Mild posterior neck pain.   PHYSICAL EXAM Filed Vitals:   07/07/14 1338 07/07/14 1725 07/07/14 2040 07/08/14 0453  BP: 103/51 118/61 129/57 136/46  Pulse: 74 73 63 51  Temp: 97.6 F (36.4 C)  97.8 F (36.6 C) 97.6 F (36.4 C)  TempSrc: Oral  Oral Oral  Resp: 16  18 18   Height:      Weight:    211 lb 3.2 oz (95.8 kg)  SpO2: 94% 96% 93% 96%   General:  No distress Lungs:  Left greater than right crackles Heart:  RRR Abdomen:  Positive bowel sounds, no rebound no guarding Extremities:  Trace edema   LABS:  Results for orders placed during the hospital encounter of 07/04/14 (from the past 24 hour(s))  BASIC METABOLIC PANEL     Status: Abnormal   Collection Time    07/08/14  5:28 AM      Result Value Ref Range   Sodium 137  137 - 147 mEq/L   Potassium 4.1  3.7 - 5.3 mEq/L   Chloride 94 (*) 96 - 112 mEq/L   CO2 31  19 - 32 mEq/L   Glucose, Bld 106 (*) 70 - 99 mg/dL   BUN 35 (*) 6 - 23 mg/dL   Creatinine, Ser 1.68 (*) 0.50 - 1.10 mg/dL   Calcium 10.2  8.4 - 10.5 mg/dL   GFR calc non Af Amer 28 (*) >90 mL/min   GFR calc Af Amer 33 (*) >90 mL/min   Anion gap 12  5 - 15    Intake/Output Summary (Last 24 hours) at 07/08/14 0806 Last data filed at 07/08/14 0459  Gross per 24 hour  Intake    960 ml  Output    850 ml  Net    110 ml    ASSESSMENT AND PLAN:  ACUTE ON CHRONIC DIASTOLIC HF:  Now creat is up.  Her sats with ambulation were OK.  She can go home but she will need a BMET early next week.  Needs to be seen within 7 days in the office.   Discharge on 20 mg Lasix daily.   She received a total of 120 mg yesterday.     CKD: Creat is up.  See above.  Will reduce diuretic at discharge with rising creat.  Check creat on Monday.     Jill Preston [facility] 07/08/2014 8:06 AM

## 2014-07-08 NOTE — Discharge Summary (Signed)
Discharge Summary   Patient ID: Jill Preston MRN: [id number], DOB/AGE: [birth year] 80 y.o. Admit date: 07/04/2014 D/C date:     07/08/2014  Primary Care Provider: Rachell Cipro, MD Primary Cardiologist: Croitoru  Primary Discharge Diagnoses:  1. Acute on chronic diastolic CHF due to hypertensive heart disease 2. AKI on CKD stage III 3. Accelerated HTN 4. Obesity Body mass index is 35.15 kg/(m^2). 5. CAD without recent angina - s/p PCI/BMS to prox LAD and balloon angioplasty to mLAD with suboptimal result in 2000 - abnormal stress test in 2012 and last cath in 09/2011 showed that the overall territory of potential ischemia was small and attributable to a moderately diseased small second diagonal artery, managed medically  Secondary Discharge Diagnoses:  1. Chronic venous insufficiency - LE VENOUS, 10/17/2011 - mild reflux in bilateral common femoral veins  2. Hyperlipidemia 3. Hypothyroidism  Hospital Course:  Jill Preston 79 y.o. female with history of CAD (s/p PCI/BMS to proximal LAD and balloon angioplasty to mid LAD with suboptimal results in 2000), HTN, HLD, chronic venous insufficiency, chronic diastolic CHF and CKD who presented to [facility] 07/04/2014 with SOB. She had an abnormal stress test in 2012 and last cath in 09/2011 showed that the overall territory of potential ischemia was small and attributable to a moderately diseased small second diagonal artery. She has been managed medically with addition of Ranexa but this was stopped this medication due to severe constipation. She has done better on an increased dose of Imdur.   She presented to the ER 07/04/2014 with worsening SOB, lower extremity edema, PND, and noticed to have BP > 200 mmHg. She has been experiencing elevated BP for the last 2 weeks. A week ago she developed dry cough. She has also had 11 lb weight gain since last OV in 01/2014. She had not had any chest pain. CXR showed borderline cardiomegaly with suggestion of  minimal vascular congestion. EKG showed SR, non-specific T wave abnormality. CMET, CBC, and initial troponin were unremarkable. pBNP was 872. D-dimer was mildly elevated but VQ showed low probability for PE. She was felt to have acute on chronic diastolic CHF and was admitted for IV diuresis. Her CAD was felt stable as she had had unchanged EKG and no recent angina. 2D Echo 07/05/14 showed EF 65-70%, mild MR. By 07/06/14, she was able to be changed to oral Lasix. Earlier on in her hospitalization she had evidence of hypoxia down to 89% on RA but closer to discharge was 96% at rest and 92-93% on ambulation thus did not require home O2. Today she is feeling better. With diuresis, her Cr has increased from 1.08 on admission to 1.38 to 1.68. However, this is closer to the levels she had 1-2 years ago of 1.4-1.77 so suspect her baseline is somewhere around 1.5. We will plan a BMET on 07/12/14 and TOC follow-up in the office. I have left a message on our [facility] office's scheduling voicemail requesting a follow-up appointment, and our office will call the patient with this appointment. I gave her a lab slip for the BMET.  Medication changes made this admission:  - Imdur decreased from 60mg  BID to 60mg  daily, - Losartan/HCTZ 100/25 1/2 tablet daily changed to Losartan 100mg  daily  - Addition of Lasix 20mg  daily.  She uses a mail order pharmacy but requested initial fill go to [facility]. At her f/u appt if she is tolerating meds well we can then send in to mail order. I did send in her  Imdur to mail order since she already had tablets at home but informed her this would be a new dose size since she was previously taking 2 30mg 's per day.  Of note, with her CKD, I advised her "Due to your kidney disease, you should avoid medicines like ibuprofen, Advil, Motrin, naproxen, and Aleve. Diclofenac (voltaren gel) is also a medicine that should be used with caution in the setting of kidney disease - please discuss  with your primary doctor."  Discharge Vitals: Blood pressure 124/52, pulse 71, temperature 98.3 F (36.8 C), temperature source Oral, resp. rate 17, height 5\' 5"  (1.651 m), weight 211 lb 3.2 oz (95.8 kg), SpO2 96.00%.  Labs: Lab Results  Component Value Date   WBC 7.6 07/04/2014   HGB 14.3 07/04/2014   HCT 42.6 07/04/2014   MCV 94.5 07/04/2014   PLT 186 07/04/2014    Recent Labs Lab 07/04/14 0923  07/08/14 0528  NA 140  < > 137  K 3.7  < > 4.1  CL 101  < > 94*  CO2 27  < > 31  BUN 21  < > 35*  CREATININE 1.08  < > 1.68*  CALCIUM 9.4  < > 10.2  PROT 6.8  --   --   BILITOT 0.8  --   --   ALKPHOS 56  --   --   ALT 14  --   --   AST 20  --   --   GLUCOSE 89  < > 106*  < > = values in this interval not displayed.  Lab Results  Component Value Date   CHOL 162 04/18/2013   HDL 42 04/18/2013   LDLCALC 92 04/18/2013   TRIG 140 04/18/2013   Lab Results  Component Value Date   DDIMER 0.65* 07/04/2014    Diagnostic Studies/Procedures   Nm Pulmonary Perf And Vent  07/04/2014   CLINICAL DATA:  Elevated D-dimer, shortness of breath with left-sided chest tightness beginning last night, initial evaluation  EXAM: NUCLEAR MEDICINE VENTILATION - PERFUSION LUNG SCAN  TECHNIQUE: Ventilation images were obtained in multiple projections using inhaled aerosol technetium 99 M DTPA. Perfusion images were obtained in multiple projections after intravenous injection of Tc-19m MAA.  RADIOPHARMACEUTICALS:  Forty mCi Tc-52m DTPA aerosol and 6 mCi Tc-16m MAA  COMPARISON:  07/04/2014 chest radiograph  FINDINGS: Ventilation: Tiny subsegmental right upper lobe defect. Tiny subsegmental left upper lobe defect.  Perfusion: Profusion imaging shows clumping of tracer centrally in both hilar regions. This suggests the possibility of air trapping which can be seen with COPD. Ventilation imaging is non back  IMPRESSION: Very low probability for pulmonary embolism. Nondiagnostic ventilation imaging.   Electronically  Signed   By: Skipper Cliche M.D.   On: 07/04/2014 17:06   Dg Chest Port 1 View  07/04/2014   CLINICAL DATA:  Shortness of breath with left-sided chest tightness beginning last night.  EXAM: PORTABLE CHEST - 1 VIEW  COMPARISON:  08/19/2013  FINDINGS: Lungs are adequately inflated with mild prominence of the perihilar markings suggesting a mild degree of vascular congestion. Borderline cardiomegaly. Fusion hardware over the lower cervical spine. Mild degenerative change of the spine. Minimal calcified plaque over the thoracic aorta.  IMPRESSION: Borderline cardiomegaly with suggestion of minimal vascular congestion.   Electronically Signed   By: Marin Olp M.D.   On: 07/04/2014 10:36   2D echo 07/05/14 - Left ventricle: Systolic function was vigorous. The estimated ejection fraction was in the range of 65% to  70%. - Mitral valve: There was mild regurgitation.    Discharge Medications   Current Discharge Medication List    START taking these medications   Details  furosemide (LASIX) 20 MG tablet Take 1 tablet (20 mg total) by mouth daily. Qty: 30 tablet, Refills: 1    losartan (COZAAR) 100 MG tablet Take 1 tablet (100 mg total) by mouth daily. Qty: 30 tablet, Refills: 1      CONTINUE these medications which have CHANGED   Details  isosorbide mononitrate (IMDUR) 60 MG 24 hr tablet Take 1 tablet (60 mg total) by mouth daily. Qty: 90 tablet, Refills: 2      CONTINUE these medications which have NOT CHANGED   Details  aspirin EC 81 MG tablet Take 1 tablet (81 mg total) by mouth daily.     diclofenac sodium (VOLTAREN) 1 % GEL Apply 2 g topically 4 (four) times daily. pain    docusate sodium (COLACE) 50 MG capsule Take by mouth 2 (two) times daily.    estradiol (ESTRACE) 0.1 MG/GM vaginal cream Place 2 g vaginally daily as needed. For urinary tract infection     fenofibrate 160 MG tablet Take 160 mg by mouth daily.      levothyroxine (SYNTHROID, LEVOTHROID) 125 MCG tablet Take  125 mcg by mouth daily before breakfast.    Multiple Vitamins-Minerals (CENTRUM SILVER ADULT 50+) TABS Take by mouth.    pravastatin (PRAVACHOL) 20 MG tablet Take 20 mg by mouth daily.    nitroGLYCERIN (NITROSTAT) 0.4 MG SL tablet Place 1 tablet (0.4 mg total) under the tongue every 5 (five) minutes as needed for chest pain.       STOP taking these medications     ibuprofen (ADVIL,MOTRIN) 200 MG tablet      losartan-hydrochlorothiazide (HYZAAR) 100-25 MG per tablet         Disposition   The patient will be discharged in stable condition to home. Discharge Instructions   Diet - low sodium heart healthy    Complete by:  As directed      Increase activity slowly    Complete by:  As directed   Due to your kidney disease, you should avoid medicines like ibuprofen, Advil, Motrin, naproxen, and Aleve. Diclofenac (voltaren gel) is also a medicine that should be used with caution in the setting of kidney disease - please discuss with your primary doctor.  Your losartan/hydrochlorothiazide tablet was changed to a losartan-only tablet - this was sent to [facility]. Lasix (furosemide) is a new prescription - this was sent to [facility]. Your isosorbide mononitrate was decreased to 60mg  only ONCE a day - we have send in a prescription for a new tablet size to your mail order. If you have lots of 30mg  tablets at home, you may take 2 of them daily to equal 60mg  total until you are due for your refill of the new 60mg  tablet.          Follow-up Information   Follow up with [facility]. (Please take lab slip to [facility] labs on Monday 07/12/14 to have bloodwork drawn.)    Contact information:   [address], [address]  [place], [place] [address]  [phone] (Phone)  (Fax)  8:00am-5:00pm First Floor of your Heart Doctor's Building      Follow up with [facility]. (Our office will call you for a follow-up appointment. Please call the office if you have not heard from [place] within 3  days.)    Specialty:  Cardiology   Contact information:   [address] [place] [address] [phone]        Duration of Discharge Encounter: Greater than 30 minutes including physician and PA time.  Signed, Melina Copa PA-C 07/08/2014, 10:01 AM  Patient seen and examined.  Plan as discussed in my rounding note for today and outlined above. Jill Preston  07/08/2014  10:50 AM

## 2014-07-08 NOTE — Progress Notes (Signed)
Patient ambulated in hall, O2 SAT 87 % on room air.

## 2014-07-09 ENCOUNTER — Other Ambulatory Visit: Payer: Self-pay | Admitting: *Deleted

## 2014-07-09 DIAGNOSIS — I779 Disorder of arteries and arterioles, unspecified: Secondary | ICD-10-CM

## 2014-07-09 DIAGNOSIS — I739 Peripheral vascular disease, unspecified: Principal | ICD-10-CM

## 2014-07-12 ENCOUNTER — Telehealth: Payer: Self-pay | Admitting: Cardiology

## 2014-07-12 DIAGNOSIS — N183 Chronic kidney disease, stage 3 unspecified: Secondary | ICD-10-CM

## 2014-07-12 LAB — BASIC METABOLIC PANEL
BUN: 25 mg/dL — ABNORMAL HIGH (ref 6–23)
CO2: 31 mEq/L (ref 19–32)
Calcium: 10.3 mg/dL (ref 8.4–10.5)
Chloride: 99 mEq/L (ref 96–112)
Creat: 1.63 mg/dL — ABNORMAL HIGH (ref 0.50–1.10)
Glucose, Bld: 104 mg/dL — ABNORMAL HIGH (ref 70–99)
Potassium: 4.8 mEq/L (ref 3.5–5.3)
Sodium: 139 mEq/L (ref 135–145)

## 2014-07-12 NOTE — Telephone Encounter (Signed)
Jill Preston states that she needs lab orders for pt. She is currently in the lab

## 2014-07-12 NOTE — Telephone Encounter (Signed)
Labs original order by D Dunn PA- LAB WAS SENT TO LAB ON [address] [facility] ON [place]. REORDER LAB.SPOKE TO ADRAINNE AT [facility].

## 2014-07-14 ENCOUNTER — Telehealth: Payer: Self-pay | Admitting: Cardiovascular Disease

## 2014-07-14 NOTE — Telephone Encounter (Signed)
New message    tcm appt move up to 10/30 @ 1:30 . Per dayna dunn. Pa

## 2014-07-16 ENCOUNTER — Telehealth: Payer: Self-pay | Admitting: Physician Assistant

## 2014-07-16 ENCOUNTER — Ambulatory Visit (INDEPENDENT_AMBULATORY_CARE_PROVIDER_SITE_OTHER): Payer: Commercial Managed Care - HMO | Admitting: Physician Assistant

## 2014-07-16 ENCOUNTER — Encounter: Payer: Self-pay | Admitting: Physician Assistant

## 2014-07-16 VITALS — BP 148/82 | HR 64 | Ht 65.0 in | Wt 210.1 lb

## 2014-07-16 DIAGNOSIS — I5032 Chronic diastolic (congestive) heart failure: Secondary | ICD-10-CM | POA: Insufficient documentation

## 2014-07-16 DIAGNOSIS — N183 Chronic kidney disease, stage 3 unspecified: Secondary | ICD-10-CM

## 2014-07-16 DIAGNOSIS — I509 Heart failure, unspecified: Secondary | ICD-10-CM

## 2014-07-16 DIAGNOSIS — I110 Hypertensive heart disease with heart failure: Secondary | ICD-10-CM

## 2014-07-16 DIAGNOSIS — I2510 Atherosclerotic heart disease of native coronary artery without angina pectoris: Secondary | ICD-10-CM

## 2014-07-16 DIAGNOSIS — I10 Essential (primary) hypertension: Secondary | ICD-10-CM

## 2014-07-16 NOTE — Progress Notes (Signed)
[place], [place], [place]  [address] Phone: [phone] Fax:  [phone]  Date:  07/16/2014   Patient ID:  Jill, Preston 1933/03/09, MRN [id number]   PCP:  Rachell Cipro, MD  Cardiologist:  Croitoru  History of Present Illness: Jill Preston is a 80 y.o. female with history of CAD (s/p PCI/BMS to proximal LAD and balloon angioplasty to mid LAD with suboptimal result in 2000, moderate diag disease in 2013), HTN, HLD, chronic venous insufficiency, chronic diastolic CHF, hypertensive heart disease, and CKD III who presents for post-hospital follow-up. She had an abnormal stress test in 2012 and last cath in 09/2011 showed that the overall territory of potential ischemia was small and attributable to a moderately diseased small second diagonal artery. She has been managed medically with addition of Ranexa but this was stopped this medication due to severe constipation. She has done better on an increased dose of Imdur. She was admitted 10/18-10/22 with worsening SOB, LEE, PND, 11lb weight gain and accelerated HTN. CBC, CMET, troponin unremarkable. 2D Echo 07/05/14 showed EF 65-70%, mild MR. She was treated with IV lasix. With diuresis, her Cr has increased from 1.08 on admission to 1.38 to 1.68. However, this is closer to the levels she had 1-2 years ago of 1.4-1.77 so suspect her baseline is somewhere around 1.5. Med changes during admission included:  - Imdur decreased from 60mg  BID to 60mg  daily,  - Losartan/HCTZ 100/25 1/2 tablet daily changed to Losartan 100mg  daily  - Addition of Lasix 20mg  daily. F/u BMET on 07/12/14 showed BUN/Cr 25/1.63, slightly improved from prior values.  She comes in doing stable from a cardiac standpoint from discharge. Her dyspnea and LEE are back to their chronic level. She has h/o mild LEE following an accident of dropping something on her legs. She denies any chest pain, orthopnea, syncope, vomiting. She has chronic arthritic type  pains that are followed by her PCP.  Recent Labs: 07/04/2014: ALT 14; Hemoglobin 14.3; Pro B Natriuretic peptide (BNP) 872.5*  07/12/2014: Creatinine 1.63*; Potassium 4.8   Wt Readings from Last 3 Encounters:  07/16/14 210 lb 1.9 oz (95.31 kg)  07/08/14 211 lb 3.2 oz (95.8 kg)  01/19/14 207 lb 12.8 oz (94.257 kg)     Past Medical History  Diagnosis Date  . Chronic venous insufficiency     LEA VENOUS, 10/17/2011 - mild reflux in bilateral common femoral veins  . Hyperlipidemia   . Coronary artery disease     a. s/p PCI/BMS to prox LAD and balloon angioplasty to mLAD with suboptimal result in 2000. b. Abnl nuc 2012, cath 09/2011 - showed that the overall territory of potential ischemia was small and attributable to a moderately diseased small second diagonal artery. Med rx.  . Hypertension   . Chronic diastolic CHF (congestive heart failure)   . Hypothyroidism   . Hypertensive heart disease   . CKD (chronic kidney disease), stage III   . Obesity   . Arthritis     Current Outpatient Prescriptions  Medication Sig Dispense Refill  . aspirin EC 81 MG tablet Take 1 tablet (81 mg total) by mouth daily.  90 tablet  3  . diclofenac sodium (VOLTAREN) 1 % GEL Apply 2 g topically 4 (four) times daily. pain      . docusate sodium (COLACE) 50 MG capsule Take by mouth 2 (two) times daily.      Marland Kitchen estradiol (ESTRACE) 0.1 MG/GM vaginal cream Place 2 g vaginally daily  as needed. For urinary tract infection       . fenofibrate 160 MG tablet Take 160 mg by mouth daily.        . furosemide (LASIX) 20 MG tablet Take 1 tablet (20 mg total) by mouth daily.  30 tablet  1  . isosorbide mononitrate (IMDUR) 60 MG 24 hr tablet Take 1 tablet (60 mg total) by mouth daily.  90 tablet  2  . levothyroxine (SYNTHROID, LEVOTHROID) 125 MCG tablet Take 125 mcg by mouth daily before breakfast.      . losartan (COZAAR) 100 MG tablet Take 1 tablet (100 mg total) by mouth daily.  30 tablet  1  . Multiple  Vitamins-Minerals (CENTRUM SILVER ADULT 50+) TABS Take by mouth.      . nitroGLYCERIN (NITROSTAT) 0.4 MG SL tablet Place 1 tablet (0.4 mg total) under the tongue every 5 (five) minutes as needed for chest pain.  25 tablet  2  . pravastatin (PRAVACHOL) 20 MG tablet Take 20 mg by mouth daily.       No current facility-administered medications for this visit.    Allergies:   Atorvastatin; Latex; Shellfish allergy; Eggs or egg-derived products; Ranexa; Ciprofloxacin; Codeine; Contrast media; Gelnique; and Penicillins   Social History:  The patient  reports that she has never smoked. She has never used smokeless tobacco. She reports that she does not drink alcohol or use illicit drugs.   Family History:  The patient's family history includes Cancer in her mother; Heart disease in her brother, father, and sister. \  ROS:  Please see the history of present illness.  All other systems reviewed and negative.   PHYSICAL EXAM:  VS:  BP 148/82  Pulse 64  Ht 5\' 5"  (1.651 m)  Wt 210 lb 1.9 oz (95.31 kg)  BMI 34.97 kg/m2 Well nourished, well developed, in no acute distress HEENT: normal Neck: no JVD Cardiac:  normal S1, S2; RRR; no murmur Lungs:  clear to auscultation bilaterally, no wheezing, rhonchi or rales Abd: soft, nontender, no hepatomegaly Ext: trade R>L LE edema Skin: warm and dry Neuro:  moves all extremities spontaneously, no focal abnormalities noted  EKG:  NSR 64bpm, TWI V2, avL, no acute ST-T changes, no significant change from prior  ASSESSMENT AND PLAN:  1. Chronic diastolic CHF due to hypertensive heart disease - appears euvolemic. Down 1 lb from hospital discharge. She has chronic R>LEE with chronic venous insufficiency which sounds back to her baseline. Will not push diuretics further right now given her CKD. Her recent f/u Cr came back at 1.63. Her Cr has varied quite a bit over the past few years but baseline may fall somewhere around 1.4-1.5 when euvolemic. Continue current  regimen. Salt/fluid restriction discussed. 2. Hypertension - she brings in BP's ranging 110-130 mainly, with last home reading in the 150's. She is slightly above goal today. I have asked her to follow pressures at home and to call if running higher than 130/80. Of note, she was referred to vascular for difference in BP in her arms and sees Dr. [name] Dock in a few weeks. 3. CKD stage III - she does not have a nephrologist and has not been evaluated for sequalae such as metabolic bone disease. I think stage 3 is an appropriate time for her to establish a relationship with nephrology, especially as we continue to manage her chronic diastolic CHF. Will refer. 4. CAD s/p PCI, without recent angina - continue current regimen. It appears that she is not on  a beta blocker, perhaps due to HR in the 60s. We could reconsider this at her follow-up appointment. 5. Arthritis - f/u PCP.  Dispo: F/u with Dr. Sallyanne Kuster in 6 weeks. I noticed her AVS accidentally says 6 months. I have sent a message to our nurse to help get this changed to 6 weeks.  Signed, Melina Copa, PA-C  07/16/2014 2:52 PM

## 2014-07-16 NOTE — Patient Instructions (Addendum)
Your physician recommends that you continue on your current medications as directed. Please refer to the Current Medication list given to you today.   Your physician wants you to follow-up in:6 MONTHS  DR [place] will receive a reminder letter in the mail two months in advance. If you don't receive a letter, please call our office to schedule the follow-up appointment.   REFERRAL TO  [facility] WITH ON OF THESE  DRS IF POSSIBLE  [place], [place], [place]  ,[place] OR [place]

## 2014-07-16 NOTE — Telephone Encounter (Signed)
Jill Preston, I think this patient needs to be seen in 6 weeks, not 6 months with Dr. Sallyanne Kuster. The AVS said 6 months. Can you help get this changed? Thanks!! [employer]

## 2014-07-22 ENCOUNTER — Ambulatory Visit: Payer: Commercial Managed Care - HMO | Admitting: Cardiology

## 2014-07-28 ENCOUNTER — Telehealth: Payer: Self-pay | Admitting: Cardiovascular Disease

## 2014-07-28 NOTE — Telephone Encounter (Signed)
Please call,pt received a letter about being in a heart failure research study. She wants to know what Dr C thinks about her participating in this.If not there or phone is busy,please call-[phone].

## 2014-07-28 NOTE — Telephone Encounter (Signed)
Patient received letter asking her to partake in a research study - [employer] Study. Letter states "volunteers needed", "payements up to $425", "no cost for FDA-approved medications".   I advised patient that there is no guarantee she would receive the study medication - if it is a med versus placebo kind of trial. She voiced understanding of this, however is interested, if Dr. Sallyanne Kuster gives her an OK, as her husband did a trial when he had prostate cancer and he received the medications and it helped him a lot.   No information as to what medication is being used in the trial - if any - was provided to patient in the letter.   www.myheartfailurestudies.com [phone]  Patient needs response by 11/27  Informed patient that this will be forwarded to Dr. Sallyanne Kuster & Pamala Hurry - but that he is on vacation and will not respond for a few days. She voiced understanding  Also, patient needs f/up with Dr. Loletha Grayer 6 weeks after last OV on 10/30 with D. Dunn, PA - message will be sent to his scheduler to set this up. Patient is agreeable to OV

## 2014-07-30 NOTE — Telephone Encounter (Signed)
Patient called with Dr. Lurline Del response.  Voiced understanding.

## 2014-07-30 NOTE — Telephone Encounter (Signed)
[employer] runs multiple trials. Hard to answer without more details. I see no reason why she could not go through screening process and then get more details so I can render an opinion

## 2014-08-02 ENCOUNTER — Telehealth: Payer: Self-pay | Admitting: Cardiovascular Disease

## 2014-08-02 NOTE — Telephone Encounter (Signed)
The pt states that her weight this am is 206 lbs. And that she weighed 199 lbs. Yesterday morning.  The pt states that she weighs herself every am on a set of digital scales and on a set of regular scales. She states that her regular scales are more accurate than the digital so we are basing her weights on her regular scales. She c/o SOB with exertion and bilateral edema in her feet and legs.  Based on the pts weight on 10/30 at home of 209.5 lbs. and in the office at the pts last ov on that same day her weight was 210 lbs (both of these weights are higher than her weight this am).  Per [employer] [employer], the pt is advised to wear her compression stockings, elevate her feet as often as possible and to watch her salt and fluid intake.  She verbalized understanding and agrees with plan.

## 2014-08-02 NOTE — Telephone Encounter (Signed)
Mrs. Martinek has gained 4lbs overnight and wants to know if she should increase her Fluid pills. Please call    Thanks

## 2014-08-03 ENCOUNTER — Encounter: Payer: Self-pay | Admitting: Vascular Surgery

## 2014-08-04 ENCOUNTER — Ambulatory Visit (INDEPENDENT_AMBULATORY_CARE_PROVIDER_SITE_OTHER): Payer: Commercial Managed Care - HMO | Admitting: Vascular Surgery

## 2014-08-04 ENCOUNTER — Encounter: Payer: Self-pay | Admitting: Vascular Surgery

## 2014-08-04 ENCOUNTER — Ambulatory Visit (HOSPITAL_COMMUNITY)
Admission: RE | Admit: 2014-08-04 | Discharge: 2014-08-04 | Disposition: A | Discharge status: Home / Self Care | Payer: Medicare HMO | Source: Ambulatory Visit | Attending: Vascular Surgery | Admitting: Vascular Surgery

## 2014-08-04 ENCOUNTER — Other Ambulatory Visit: Payer: Self-pay | Admitting: Vascular Surgery

## 2014-08-04 VITALS — BP 107/59 | HR 72 | Resp 18 | Ht 65.5 in | Wt 208.3 lb

## 2014-08-04 DIAGNOSIS — I708 Atherosclerosis of other arteries: Secondary | ICD-10-CM

## 2014-08-04 DIAGNOSIS — I779 Disorder of arteries and arterioles, unspecified: Secondary | ICD-10-CM | POA: Diagnosis not present

## 2014-08-04 DIAGNOSIS — R202 Paresthesia of skin: Secondary | ICD-10-CM

## 2014-08-04 DIAGNOSIS — I739 Peripheral vascular disease, unspecified: Secondary | ICD-10-CM

## 2014-08-04 DIAGNOSIS — I771 Stricture of artery: Secondary | ICD-10-CM | POA: Insufficient documentation

## 2014-08-04 NOTE — Assessment & Plan Note (Signed)
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 However, she has no evidence of subclavian steal. She has antegrade flow in both vertebral arteries and has no vertebrobasilar symptoms. I have simply instructed her to have her blood pressure always taken on the right side. She has occasional paresthesias in the left hand and I think this is likely related to previous procedures on the left hand. She has no significant carotid disease noted on her duplex scan. I do not think any further vascular workup is indicated. She also has some chronic lower extremity swelling partly related to her heart failure, but also she has some evidence of chronic venous insufficiency. We have discussed the importance of intermittent leg elevation in the proper positioning for this.

## 2014-08-04 NOTE — Progress Notes (Signed)
Patient ID: Jill Preston, female   DOB: 06/11/1933, 80 y.o.   MRN: [id number]  Reason for Consult:  Unequal arm pressures.  Referred by Dr. Rachell Cipro  Subjective:     HPI:  JANNAE Preston is a 80 y.o. female who was noted to have a blood pressure discrepancy in her upper extremities and was sent for vascular evaluation. She does note some occasional paresthesias in her left hand. She states that she's had cysts worked on on the left hand and the symptoms may be related to these previous procedures. I do not get any history consistent with TIAs. She did have a stroke in the remote past but cannot remember the details. She denies any problems with tinnitus or vertigo.  Of note she was admitted to the hospital with congestive heart failure last month and she is followed by Dr. Sallyanne Kuster.  I have reviewed her records from Dr. Ival Bible office. She has multiple medical issues including arthritis, morbid obesity, benign essential hypertension, hypothyroidism. Her blood pressure appears to be under good control. She had been having some occasional pain in the left arm and tingling in the left arm.  Past Medical History  Diagnosis Date  . Chronic venous insufficiency     LEA VENOUS, 10/17/2011 - mild reflux in bilateral common femoral veins  . Hyperlipidemia   . Coronary artery disease     a. s/p PCI/BMS to prox LAD and balloon angioplasty to mLAD with suboptimal result in 2000. b. Abnl nuc 2012, cath 09/2011 - showed that the overall territory of potential ischemia was small and attributable to a moderately diseased small second diagonal artery. Med rx.  . Hypertension   . Chronic diastolic CHF (congestive heart failure)   . Hypothyroidism   . Hypertensive heart disease   . CKD (chronic kidney disease), stage III   . Obesity   . Arthritis   . Stroke     pt. states she had "light stroke" in sept. 1980   Family History  Problem Relation Age of Onset  . Cancer Mother   . Heart  disease Father   . Heart disease Sister   . Heart disease Brother    Past Surgical History  Procedure Laterality Date  . Cardiac catheterization Left 09/25/2011    Medical management  . Cardiac catheterization Left 03/25/2001    Normal LV function, LAD residual narrowing of less than 10%, normal ramus intermediate, circumflex, and RCA,   . Cardiac catheterization  09/04/1999    LAD, 3x23mm Tetra stent resulting in a reduction of the 80% stenosis to 0% residual  . Coronary stent placement      LAD  . Bladder surgery  2010    WITH MESH   . Cholecystectomy    . Abdominal hysterectomy    . Rotator cuff repair Right     Short Social History:  History  Substance Use Topics  . Smoking status: Never Smoker   . Smokeless tobacco: Never Used  . Alcohol Use: No    Allergies  Allergen Reactions  . Atorvastatin Anaphylaxis and Other (See Comments)    Rxn unknown  . Latex Other (See Comments)    Rxn is blisters  . Shellfish Allergy Nausea And Vomiting  . Eggs Or Egg-Derived Products   . Ranexa [Ranolazine]     Constipation   . Ciprofloxacin Hives  . Codeine Nausea And Vomiting  . Contrast Media [Iodinated Diagnostic Agents] Other (See Comments)    Flushing  . Gelnique [Oxybutynin]  Other (See Comments)    Rxn is blisters  . Penicillins Hives    Current Outpatient Prescriptions  Medication Sig Dispense Refill  . aspirin EC 81 MG tablet Take 1 tablet (81 mg total) by mouth daily. 90 tablet 3  . docusate sodium (COLACE) 50 MG capsule Take by mouth 2 (two) times daily.    Marland Kitchen estradiol (ESTRACE) 0.1 MG/GM vaginal cream Place 2 g vaginally daily as needed. For urinary tract infection     . fenofibrate 160 MG tablet Take 160 mg by mouth daily.      . furosemide (LASIX) 20 MG tablet Take 1 tablet (20 mg total) by mouth daily. 30 tablet 1  . isosorbide mononitrate (IMDUR) 60 MG 24 hr tablet Take 1 tablet (60 mg total) by mouth daily. 90 tablet 2  . levothyroxine (SYNTHROID, LEVOTHROID) 125  MCG tablet Take 125 mcg by mouth daily before breakfast.    . losartan (COZAAR) 100 MG tablet Take 1 tablet (100 mg total) by mouth daily. 30 tablet 1  . Multiple Vitamins-Minerals (CENTRUM SILVER ADULT 50+) TABS Take by mouth.    . nitroGLYCERIN (NITROSTAT) 0.4 MG SL tablet Place 1 tablet (0.4 mg total) under the tongue every 5 (five) minutes as needed for chest pain. 25 tablet 2  . pravastatin (PRAVACHOL) 20 MG tablet Take 20 mg by mouth daily.    . diclofenac sodium (VOLTAREN) 1 % GEL Apply 2 g topically 4 (four) times daily. pain     No current facility-administered medications for this visit.    Review of Systems  Constitutional: Negative for chills and fever.  Eyes: Negative for loss of vision.  Respiratory: Negative for cough and wheezing.  Cardiovascular: Negative for chest pain, chest tightness, claudication, dyspnea with exertion, orthopnea and palpitations.  GI: Negative for blood in stool and vomiting.  GU: Negative for dysuria and hematuria.  Musculoskeletal: Negative for leg pain, joint pain and myalgias.  Skin: Negative for rash and wound.  Neurological: Positive for numbness. Negative for dizziness and speech difficulty.       She occasionally has paresthesias in her left hand. Hematologic: Negative for bruises/bleeds easily. Psychiatric: Negative for depressed mood.        Objective:  Objective  Filed Vitals:   08/04/14 1356  BP: 107/59  Pulse: 72  Resp: 18  Height: 5' 5.5" (1.664 m)  Weight: 208 lb 4.8 oz (94.484 kg)   Body mass index is 34.12 kg/(m^2).  Physical Exam  Constitutional: She is oriented to person, place, and time. She appears well-developed and well-nourished.  HENT:  Head: Normocephalic and atraumatic.  Neck: Neck supple. No JVD present. No thyromegaly present.  Cardiovascular: Normal rate, regular rhythm, normal heart sounds and normal pulses.  Exam reveals no friction rub.   No murmur heard. Pulses:      Femoral pulses are 2+ on the  right side, and 2+ on the left side.      Popliteal pulses are 2+ on the right side, and 2+ on the left side.       Dorsalis pedis pulses are 2+ on the right side, and 2+ on the left side.  I do not detect carotid bruits.  Pulmonary/Chest: Breath sounds normal. She has no wheezes. She has no rales.  Abdominal: Soft. Bowel sounds are normal. There is no tenderness.  Musculoskeletal: Normal range of motion. She exhibits no edema.  Lymphadenopathy:    She has no cervical adenopathy.  Neurological: She is alert and oriented to person,  place, and time. She has normal strength. No sensory deficit.  Skin: No lesion and no rash noted.  She does have some varicose veins bilaterally.  Psychiatric: She has a normal mood and affect.   Data: CAROTID DUPLEX: I have independently interpreted her carotid duplex scan. She has a less than 40% carotid stenosis bilaterally. There is a slight difference in her arm pressures. Systolic pressure on the right is 122 mmHg. Systolic pressure on the left is 100 mmHg. She has a triphasic brachial artery waveform bilaterally. Both vertebral arteries are patent with antegrade flow.      Assessment/Plan:     Subclavian artery stenosis, left Based on her Doppler study she does have evidence of a left subclavian artery stenosis. However, she has no evidence of subclavian steal. She has antegrade flow in both vertebral arteries and has no vertebrobasilar symptoms. I have simply instructed her to have her blood pressure always taken on the right side. She has occasional paresthesias in the left hand and I think this is likely related to previous procedures on the left hand. She has no significant carotid disease noted on her duplex scan. I do not think any further vascular workup is indicated. She also has some chronic lower extremity swelling partly related to her heart failure, but also she has some evidence of chronic venous insufficiency. We have discussed the importance of  intermittent leg elevation in the proper positioning for this.   Angelia Mould MD [facility]

## 2014-08-17 HISTORY — PX: OTHER SURGICAL HISTORY: SHX169

## 2014-08-18 ENCOUNTER — Ambulatory Visit (INDEPENDENT_AMBULATORY_CARE_PROVIDER_SITE_OTHER): Payer: Commercial Managed Care - HMO | Admitting: Cardiovascular Disease

## 2014-08-18 ENCOUNTER — Encounter: Payer: Self-pay | Admitting: Cardiovascular Disease

## 2014-08-18 VITALS — BP 108/50 | HR 72 | Ht 65.5 in | Wt 210.2 lb

## 2014-08-18 DIAGNOSIS — I5032 Chronic diastolic (congestive) heart failure: Secondary | ICD-10-CM

## 2014-08-18 DIAGNOSIS — I509 Heart failure, unspecified: Secondary | ICD-10-CM

## 2014-08-18 DIAGNOSIS — E785 Hyperlipidemia, unspecified: Secondary | ICD-10-CM

## 2014-08-18 DIAGNOSIS — N183 Chronic kidney disease, stage 3 unspecified: Secondary | ICD-10-CM

## 2014-08-18 DIAGNOSIS — I2510 Atherosclerotic heart disease of native coronary artery without angina pectoris: Secondary | ICD-10-CM

## 2014-08-18 DIAGNOSIS — I110 Hypertensive heart disease with heart failure: Secondary | ICD-10-CM

## 2014-08-18 DIAGNOSIS — I708 Atherosclerosis of other arteries: Secondary | ICD-10-CM

## 2014-08-18 DIAGNOSIS — E669 Obesity, unspecified: Secondary | ICD-10-CM

## 2014-08-18 DIAGNOSIS — E039 Hypothyroidism, unspecified: Secondary | ICD-10-CM

## 2014-08-18 DIAGNOSIS — I771 Stricture of artery: Secondary | ICD-10-CM

## 2014-08-18 MED ORDER — FUROSEMIDE 20 MG PO TABS: 40.0000 mg | ORAL_TABLET | Freq: Every day | ORAL | Status: DC

## 2014-08-18 NOTE — Progress Notes (Signed)
Patient ID: Jill Preston, female   DOB: 09/19/1932, 80 y.o.   MRN: [id number]     Reason for office visit Follow-up after hospitalization for acute exacerbation of diastolic heart failure  Jill Preston is a 80 y.o. female with history of CAD (s/p PCI/BMS to proximal LAD and balloon angioplasty to mid LAD, moderate diag disease in 2013), HTN, HLD, chronic venous insufficiency, chronic diastolic CHF, hypertensive heart disease, and CKD III. She had an abnormal stress test in 2012 and last cath in 09/2011 showed that the overall territory of potential ischemia was small and attributable to a moderately diseased small second diagonal artery. She has been managed medically with addition of Ranexa but this was stopped due to severe constipation. She has done better on an increased dose of Imdur. She was admitted 10/18-10/22 with worsening SOB, LEE, PND, 11lb weight gain and accelerated HTN. CBC, CMET, troponin unremarkable. 2D Echo 07/05/14 showed EF 65-70%, mild MR. She was treated with IV lasix. With diuresis, her Cr has increased from 1.08 on admission to 1.38 to 1.68. However, this is comparable to the levels she had 1-2 years ago of 1.4-1.77. Since her appointment in our office on October 30 her weight has not really changed.  She describes very rare dizziness when she bends over for a long time. Mostly she complains about shortness of breath and "exhaustion" after even limited physical activity. She has to stop for return to her car after just 15 minutes of shopping in a grocery store. She has chronic lower extremity edema and it has been difficult to distinguish heart failure from peripheral venous insufficiency.   Allergies  Allergen Reactions  . Atorvastatin Anaphylaxis and Other (See Comments)    Rxn unknown  . Latex Other (See Comments)    Rxn is blisters  . Shellfish Allergy Nausea And Vomiting  . Eggs Or Egg-Derived Products   . Ranexa [Ranolazine]     Constipation   .  Ciprofloxacin Hives  . Codeine Nausea And Vomiting  . Contrast Media [Iodinated Diagnostic Agents] Other (See Comments)    Flushing  . Gelnique [Oxybutynin] Other (See Comments)    Rxn is blisters  . Penicillins Hives    Current Outpatient Prescriptions  Medication Sig Dispense Refill  . aspirin EC 81 MG tablet Take 1 tablet (81 mg total) by mouth daily. 90 tablet 3  . diclofenac sodium (VOLTAREN) 1 % GEL Apply 2 g topically 4 (four) times daily. pain    . docusate sodium (COLACE) 50 MG capsule Take by mouth 2 (two) times daily.    Marland Kitchen estradiol (ESTRACE) 0.1 MG/GM vaginal cream Place 2 g vaginally daily as needed. For urinary tract infection     . fenofibrate 160 MG tablet Take 160 mg by mouth daily.      . fluconazole (DIFLUCAN) 150 MG tablet Take 150 mg by mouth every 30 (thirty) days.  0  . fluticasone (CUTIVATE) 0.05 % cream Apply topically 2 (two) times daily.  0  . furosemide (LASIX) 20 MG tablet Take 2 tablets (40 mg total) by mouth daily. 60 tablet 12  . isosorbide mononitrate (IMDUR) 60 MG 24 hr tablet Take 1 tablet (60 mg total) by mouth daily. 90 tablet 2  . ketoconazole (NIZORAL) 2 % cream Apply topically 2 (two) times daily.  0  . levothyroxine (SYNTHROID, LEVOTHROID) 125 MCG tablet Take 125 mcg by mouth daily before breakfast.    . losartan (COZAAR) 100 MG tablet Take 1 tablet (100 mg total)  by mouth daily. 30 tablet 1  . Multiple Vitamins-Minerals (CENTRUM SILVER ADULT 50+) TABS Take by mouth.    . nitroGLYCERIN (NITROSTAT) 0.4 MG SL tablet Place 1 tablet (0.4 mg total) under the tongue every 5 (five) minutes as needed for chest pain. 25 tablet 2  . pravastatin (PRAVACHOL) 20 MG tablet Take 20 mg by mouth daily.    . TURMERIC CURCUMIN PO Take 1 tablet by mouth daily.    . Vitamin D-Vitamin K (D3 + K2 DOTS PO) Take 1 tablet by mouth daily.     No current facility-administered medications for this visit.    Past Medical History  Diagnosis Date  . Chronic venous  insufficiency     LEA VENOUS, 10/17/2011 - mild reflux in bilateral common femoral veins  . Hyperlipidemia   . Coronary artery disease     a. s/p PCI/BMS to prox LAD and balloon angioplasty to mLAD with suboptimal result in 2000. b. Abnl nuc 2012, cath 09/2011 - showed that the overall territory of potential ischemia was small and attributable to a moderately diseased small second diagonal artery. Med rx.  . Hypertension   . Chronic diastolic CHF (congestive heart failure)   . Hypothyroidism   . Hypertensive heart disease   . CKD (chronic kidney disease), stage III   . Obesity   . Arthritis   . Stroke     pt. states she had "light stroke" in sept. 1980    Past Surgical History  Procedure Laterality Date  . Cardiac catheterization Left 09/25/2011    Medical management  . Cardiac catheterization Left 03/25/2001    Normal LV function, LAD residual narrowing of less than 10%, normal ramus intermediate, circumflex, and RCA,   . Cardiac catheterization  09/04/1999    LAD, 3x16mm Tetra stent resulting in a reduction of the 80% stenosis to 0% residual  . Coronary stent placement      LAD  . Bladder surgery  2010    WITH MESH   . Cholecystectomy    . Abdominal hysterectomy    . Rotator cuff repair Right     Family History  Problem Relation Age of Onset  . Cancer Mother   . Heart disease Father   . Heart disease Sister   . Heart disease Brother     History   Social History  . Marital Status: Married    Spouse Name: N/A    Number of Children: N/A  . Years of Education: N/A   Occupational History  . Not on file.   Social History Main Topics  . Smoking status: Never Smoker   . Smokeless tobacco: Never Used  . Alcohol Use: No  . Drug Use: No  . Sexual Activity: Not on file   Other Topics Concern  . Not on file   Social History Narrative    Review of systems: Shortness of breath on exertion, NYHA functional class II-III The patient specifically denies any chest pain at  rest or with exertion, dyspnea at rest , orthopnea, paroxysmal nocturnal dyspnea, syncope, palpitations, focal neurological deficits, intermittent claudication, lower extremity edema, unexplained weight gain, cough, hemoptysis or wheezing.  The patient also denies abdominal pain, nausea, vomiting, dysphagia, diarrhea, constipation, polyuria, polydipsia, dysuria, hematuria, frequency, urgency, abnormal bleeding or bruising, fever, chills, unexpected weight changes, mood swings, change in skin or hair texture, change in voice quality, auditory or visual problems, allergic reactions or rashes, new musculoskeletal complaints other than usual "aches and pains".   PHYSICAL EXAM BP  108/50 mmHg  Pulse 72  Ht 5' 5.5" (1.664 m)  Wt 210 lb 3.2 oz (95.346 kg)  BMI 34.43 kg/m2  General: Alert, oriented x3, no distress. Obesity does limit her exam Head: no evidence of trauma, PERRL, EOMI, no exophtalmos or lid lag, no myxedema, no xanthelasma; normal ears, nose and oropharynx Neck: normal jugular venous pulsations and no hepatojugular reflux; brisk carotid pulses without delay and no carotid bruits Chest: clear to auscultation, no signs of consolidation by percussion or palpation, normal fremitus, symmetrical and full respiratory excursions Cardiovascular: normal position and quality of the apical impulse, regular rhythm, normal first and second heart sounds, no murmurs, rubs or gallops Abdomen: no tenderness or distention, no masses by palpation, no abnormal pulsatility or arterial bruits, normal bowel sounds, no hepatosplenomegaly Extremities: no clubbing, cyanosis; there is 2+ right ankle edema and 1+ left ankle edema; 2+ radial, ulnar and brachial pulses bilaterally; 2+ right femoral, posterior tibial and dorsalis pedis pulses; 2+ left femoral, posterior tibial and dorsalis pedis pulses; no subclavian or femoral bruits Neurological: grossly nonfocal   Lipid Panel     Component Value Date/Time   CHOL  162 04/18/2013 0826   TRIG 140 04/18/2013 0826   HDL 42 04/18/2013 0826   CHOLHDL 3.9 04/18/2013 0826   VLDL 28 04/18/2013 0826   LDLCALC 92 04/18/2013 0826    BMET    Component Value Date/Time   NA 139 07/12/2014 1318   K 4.8 07/12/2014 1318   CL 99 07/12/2014 1318   CO2 31 07/12/2014 1318   GLUCOSE 104* 07/12/2014 1318   BUN 25* 07/12/2014 1318   CREATININE 1.63* 07/12/2014 1318   CREATININE 1.68* 07/08/2014 0528   CALCIUM 10.3 07/12/2014 1318   GFRNONAA 28* 07/08/2014 0528   GFRAA 33* 07/08/2014 0528     ASSESSMENT AND PLAN Diastolic heart failure, chronic Functional status is mediocre, She may still have some hypervolemia. Have asked her to increase her furosemide to 40 mg daily. Will repeat lab tests in short order.  CAD LAD stent 12/00. Patent '02 and 1/13 Angina is now well controlled. In the past she has not tolerated Ranexa and other meds caused orthostatic hypotension when used in higher doses.  Dyslipidemia Fair lipid profile. Ideally LDL would be <70, but intolerant to more potent statins like atorvastatin.Crestor is too costly  Venous insufficiency Ankle edema resolves if she elevates her legs  Left subclavian artery stenosis without evidence of subclavian steal Blood pressure should always be checked in the right upper extremity.  Patient Instructions  Your physician recommends that you schedule a follow-up appointment in: [place]  Your physician recommends that you HAVE LAB WORK IN ONE WEEK  INCREASE FUROSEMIDE TO 40 MG ONCE DAILY= 2 OF THE 20 MG TABLETS ONCE DAILY    Orders Placed This Encounter  Procedures  . BMP with Estimated GFR (CPT-80048)  . TSH   Meds ordered this encounter  Medications  . fluconazole (DIFLUCAN) 150 MG tablet    Sig: Take 150 mg by mouth every 30 (thirty) days.    Refill:  0  . fluticasone (CUTIVATE) 0.05 % cream    Sig: Apply topically 2 (two) times daily.    Refill:  0  . ketoconazole (NIZORAL)  2 % cream    Sig: Apply topically 2 (two) times daily.    Refill:  0  . TURMERIC CURCUMIN PO    Sig: Take 1 tablet by mouth daily.  . Vitamin D-Vitamin K (D3 +  K2 DOTS PO)    Sig: Take 1 tablet by mouth daily.  . furosemide (LASIX) 20 MG tablet    Sig: Take 2 tablets (40 mg total) by mouth daily.    Dispense:  60 tablet    Refill:  [address], [place], [facility] [phone] office [phone] pager

## 2014-08-18 NOTE — Patient Instructions (Addendum)
Your physician recommends that you schedule a follow-up appointment in: [place]  Your physician recommends that you HAVE LAB WORK IN ONE WEEK  INCREASE FUROSEMIDE TO 40 MG ONCE DAILY= 2 OF THE 20 MG TABLETS ONCE DAILY

## 2014-08-26 ENCOUNTER — Encounter (HOSPITAL_COMMUNITY): Payer: Self-pay | Admitting: Cardiovascular Disease

## 2014-08-26 LAB — BASIC METABOLIC PANEL WITH GFR
BUN: 35 mg/dL — ABNORMAL HIGH (ref 6–23)
CO2: 32 mEq/L (ref 19–32)
Calcium: 10.3 mg/dL (ref 8.4–10.5)
Chloride: 97 mEq/L (ref 96–112)
Creat: 1.47 mg/dL — ABNORMAL HIGH (ref 0.50–1.10)
GFR, Est African American: 39 mL/min — ABNORMAL LOW
GFR, Est Non African American: 34 mL/min — ABNORMAL LOW
Glucose, Bld: 105 mg/dL — ABNORMAL HIGH (ref 70–99)
Potassium: 3.7 mEq/L (ref 3.5–5.3)
Sodium: 139 mEq/L (ref 135–145)

## 2014-08-26 LAB — TSH: TSH: 0.689 u[IU]/mL (ref 0.350–4.500)

## 2014-08-27 ENCOUNTER — Other Ambulatory Visit: Payer: Self-pay | Admitting: *Deleted

## 2014-08-27 ENCOUNTER — Telehealth: Payer: Self-pay | Admitting: Cardiovascular Disease

## 2014-08-27 MED ORDER — LOSARTAN POTASSIUM 100 MG PO TABS: 100.0000 mg | ORAL_TABLET | Freq: Every day | ORAL | Status: DC

## 2014-08-27 NOTE — Telephone Encounter (Signed)
Lab results discussed with patient.  Voiced understanding.

## 2014-08-27 NOTE — Telephone Encounter (Signed)
Returning your call,concerning her lab work.

## 2014-09-05 ENCOUNTER — Emergency Department (HOSPITAL_COMMUNITY)
Admission: EM | Admit: 2014-09-05 | Discharge: 2014-09-05 | Disposition: A | Discharge status: Other Acute Inpatient Hospital | Payer: Commercial Managed Care - HMO | Attending: Emergency Medicine | Admitting: Emergency Medicine

## 2014-09-05 ENCOUNTER — Encounter (HOSPITAL_COMMUNITY): Payer: Self-pay | Admitting: *Deleted

## 2014-09-05 DIAGNOSIS — I130 Hypertensive heart and chronic kidney disease with heart failure and stage 1 through stage 4 chronic kidney disease, or unspecified chronic kidney disease: Secondary | ICD-10-CM | POA: Insufficient documentation

## 2014-09-05 DIAGNOSIS — Z9889 Other specified postprocedural states: Secondary | ICD-10-CM | POA: Diagnosis not present

## 2014-09-05 DIAGNOSIS — I2510 Atherosclerotic heart disease of native coronary artery without angina pectoris: Secondary | ICD-10-CM | POA: Diagnosis not present

## 2014-09-05 DIAGNOSIS — N183 Chronic kidney disease, stage 3 (moderate): Secondary | ICD-10-CM | POA: Insufficient documentation

## 2014-09-05 DIAGNOSIS — Z91040 Latex allergy status: Secondary | ICD-10-CM | POA: Diagnosis not present

## 2014-09-05 DIAGNOSIS — I998 Other disorder of circulatory system: Secondary | ICD-10-CM | POA: Insufficient documentation

## 2014-09-05 DIAGNOSIS — Z7982 Long term (current) use of aspirin: Secondary | ICD-10-CM | POA: Diagnosis not present

## 2014-09-05 DIAGNOSIS — Z79899 Other long term (current) drug therapy: Secondary | ICD-10-CM | POA: Diagnosis not present

## 2014-09-05 DIAGNOSIS — M6281 Muscle weakness (generalized): Secondary | ICD-10-CM | POA: Diagnosis present

## 2014-09-05 DIAGNOSIS — Z9861 Coronary angioplasty status: Secondary | ICD-10-CM | POA: Insufficient documentation

## 2014-09-05 DIAGNOSIS — I5032 Chronic diastolic (congestive) heart failure: Secondary | ICD-10-CM | POA: Diagnosis not present

## 2014-09-05 DIAGNOSIS — I509 Heart failure, unspecified: Secondary | ICD-10-CM | POA: Insufficient documentation

## 2014-09-05 DIAGNOSIS — Z880 Allergy status to penicillin: Secondary | ICD-10-CM | POA: Diagnosis not present

## 2014-09-05 DIAGNOSIS — M79605 Pain in left leg: Secondary | ICD-10-CM

## 2014-09-05 DIAGNOSIS — M1990 Unspecified osteoarthritis, unspecified site: Secondary | ICD-10-CM | POA: Diagnosis not present

## 2014-09-05 DIAGNOSIS — E669 Obesity, unspecified: Secondary | ICD-10-CM | POA: Insufficient documentation

## 2014-09-05 DIAGNOSIS — Z8673 Personal history of transient ischemic attack (TIA), and cerebral infarction without residual deficits: Secondary | ICD-10-CM | POA: Diagnosis not present

## 2014-09-05 DIAGNOSIS — E785 Hyperlipidemia, unspecified: Secondary | ICD-10-CM | POA: Insufficient documentation

## 2014-09-05 DIAGNOSIS — Z791 Long term (current) use of non-steroidal anti-inflammatories (NSAID): Secondary | ICD-10-CM | POA: Diagnosis not present

## 2014-09-05 DIAGNOSIS — E039 Hypothyroidism, unspecified: Secondary | ICD-10-CM | POA: Insufficient documentation

## 2014-09-05 LAB — DIFFERENTIAL
Basophils Absolute: 0.1 10*3/uL (ref 0.0–0.1)
Basophils Relative: 0 % (ref 0–1)
Eosinophils Absolute: 0.1 10*3/uL (ref 0.0–0.7)
Eosinophils Relative: 1 % (ref 0–5)
Lymphocytes Relative: 12 % (ref 12–46)
Lymphs Abs: 1.4 10*3/uL (ref 0.7–4.0)
Monocytes Absolute: 0.7 10*3/uL (ref 0.1–1.0)
Monocytes Relative: 5 % (ref 3–12)
Neutro Abs: 10 10*3/uL — ABNORMAL HIGH (ref 1.7–7.7)
Neutrophils Relative %: 82 % — ABNORMAL HIGH (ref 43–77)

## 2014-09-05 LAB — COMPREHENSIVE METABOLIC PANEL
ALT: 20 U/L (ref 0–35)
AST: 30 U/L (ref 0–37)
Albumin: 4 g/dL (ref 3.5–5.2)
Alkaline Phosphatase: 43 U/L (ref 39–117)
Anion gap: 18 — ABNORMAL HIGH (ref 5–15)
BUN: 43 mg/dL — ABNORMAL HIGH (ref 6–23)
CO2: 28 mEq/L (ref 19–32)
Calcium: 10.9 mg/dL — ABNORMAL HIGH (ref 8.4–10.5)
Chloride: 90 mEq/L — ABNORMAL LOW (ref 96–112)
Creatinine, Ser: 2.1 mg/dL — ABNORMAL HIGH (ref 0.50–1.10)
GFR calc Af Amer: 25 mL/min — ABNORMAL LOW (ref >90)
GFR calc non Af Amer: 21 mL/min — ABNORMAL LOW (ref >90)
Glucose, Bld: 132 mg/dL — ABNORMAL HIGH (ref 70–99)
Potassium: 3.5 mEq/L — ABNORMAL LOW (ref 3.7–5.3)
Sodium: 136 mEq/L — ABNORMAL LOW (ref 137–147)
Total Bilirubin: 0.7 mg/dL (ref 0.3–1.2)
Total Protein: 7.3 g/dL (ref 6.0–8.3)

## 2014-09-05 LAB — CBC
HCT: 43.1 % (ref 36.0–46.0)
Hemoglobin: 14.5 g/dL (ref 12.0–15.0)
MCH: 31.9 pg (ref 26.0–34.0)
MCHC: 33.6 g/dL (ref 30.0–36.0)
MCV: 94.9 fL (ref 78.0–100.0)
Platelets: 226 10*3/uL (ref 150–400)
RBC: 4.54 MIL/uL (ref 3.87–5.11)
RDW: 13.4 % (ref 11.5–15.5)
WBC: 12.2 10*3/uL — ABNORMAL HIGH (ref 4.0–10.5)

## 2014-09-05 LAB — I-STAT TROPONIN, ED: Troponin i, poc: 0.09 ng/mL (ref 0.00–0.08)

## 2014-09-05 LAB — APTT: aPTT: 26 seconds (ref 24–37)

## 2014-09-05 LAB — PROTIME-INR
INR: 1.08 (ref 0.00–1.49)
Prothrombin Time: 14.1 seconds (ref 11.6–15.2)

## 2014-09-05 MED ORDER — HEPARIN BOLUS VIA INFUSION
5000.0000 [IU] | Freq: Once | INTRAVENOUS | Status: AC
  Administered 2014-09-05: 5000 [IU] via INTRAVENOUS
  Filled 2014-09-05: qty 5000

## 2014-09-05 MED ORDER — SODIUM CHLORIDE 0.9 % IV BOLUS (SEPSIS)
500.0000 mL | Freq: Once | INTRAVENOUS | Status: AC
  Administered 2014-09-05: 500 mL via INTRAVENOUS

## 2014-09-05 MED ORDER — HEPARIN (PORCINE) IN NACL 100-0.45 UNIT/ML-% IJ SOLN
1200.0000 [IU]/h | INTRAMUSCULAR | Status: DC
  Administered 2014-09-05: 1200 [IU]/h via INTRAVENOUS
  Filled 2014-09-05: qty 250

## 2014-09-05 NOTE — ED Provider Notes (Signed)
CSN: [id number]     Arrival date & time 09/05/14  1343 History   First MD Initiated Contact with Patient 09/05/14 1431     Chief Complaint  Patient presents with  . Extremity Weakness     (Consider location/radiation/quality/duration/timing/severity/associated sxs/prior Treatment) Patient is a 80 y.o. female presenting with extremity weakness. The history is provided by the patient.  Extremity Weakness This is a new problem. The current episode started 6 to 12 hours ago. The problem occurs constantly. The problem has not changed since onset.Pertinent negatives include no abdominal pain and no shortness of breath. Exacerbated by: bearing weight, walking. Nothing relieves the symptoms. She has tried nothing for the symptoms.    Past Medical History  Diagnosis Date  . Chronic venous insufficiency     LEA VENOUS, 10/17/2011 - mild reflux in bilateral common femoral veins  . Hyperlipidemia   . Coronary artery disease     a. s/p PCI/BMS to prox LAD and balloon angioplasty to mLAD with suboptimal result in 2000. b. Abnl nuc 2012, cath 09/2011 - showed that the overall territory of potential ischemia was small and attributable to a moderately diseased small second diagonal artery. Med rx.  . Hypertension   . Chronic diastolic CHF (congestive heart failure)   . Hypothyroidism   . Hypertensive heart disease   . CKD (chronic kidney disease), stage III   . Obesity   . Arthritis   . Stroke     pt. states she had "light stroke" in sept. 1980   Past Surgical History  Procedure Laterality Date  . Cardiac catheterization Left 09/25/2011    Medical management  . Cardiac catheterization Left 03/25/2001    Normal LV function, LAD residual narrowing of less than 10%, normal ramus intermediate, circumflex, and RCA,   . Cardiac catheterization  09/04/1999    LAD, 3x91mm Tetra stent resulting in a reduction of the 80% stenosis to 0% residual  . Coronary stent placement      LAD  . Bladder surgery  2010     WITH MESH   . Cholecystectomy    . Abdominal hysterectomy    . Rotator cuff repair Right   . Left heart catheterization with coronary angiogram N/A 09/25/2011    Procedure: LEFT HEART CATHETERIZATION WITH CORONARY ANGIOGRAM;  Surgeon: Sanda Klein, MD;  Location: [place];  Service: Cardiovascular;  Laterality: N/A;   Family History  Problem Relation Age of Onset  . Cancer Mother   . Heart disease Father   . Heart disease Sister   . Heart disease Brother    History  Substance Use Topics  . Smoking status: Never Smoker   . Smokeless tobacco: Never Used  . Alcohol Use: No   OB History    No data available     Review of Systems  Constitutional: Negative for fever and chills.  Respiratory: Negative for cough and shortness of breath.   Gastrointestinal: Negative for vomiting and abdominal pain.  Musculoskeletal: Positive for extremity weakness.  All other systems reviewed and are negative.     Allergies  Atorvastatin; Latex; Shellfish allergy; Eggs or egg-derived products; Ranexa; Ciprofloxacin; Codeine; Contrast media; Gelnique; and Penicillins  Home Medications   Prior to Admission medications   Medication Sig Start Date End Date Taking? Authorizing Provider  aspirin EC 81 MG tablet Take 1 tablet (81 mg total) by mouth daily. 02/26/13   Erlene Quan, PA-C  diclofenac sodium (VOLTAREN) 1 % GEL Apply 2 g topically 4 (four) times  daily. pain    Historical Provider, MD  docusate sodium (COLACE) 50 MG capsule Take by mouth 2 (two) times daily.    Historical Provider, MD  estradiol (ESTRACE) 0.1 MG/GM vaginal cream Place 2 g vaginally daily as needed. For urinary tract infection     Historical Provider, MD  fenofibrate 160 MG tablet Take 160 mg by mouth daily.      Historical Provider, MD  fluconazole (DIFLUCAN) 150 MG tablet Take 150 mg by mouth every 30 (thirty) days. 08/17/14   Historical Provider, MD  fluticasone (CUTIVATE) 0.05 % cream Apply topically 2 (two) times  daily. 08/17/14   Historical Provider, MD  furosemide (LASIX) 20 MG tablet Take 2 tablets (40 mg total) by mouth daily. 08/18/14   Mihai Croitoru, MD  isosorbide mononitrate (IMDUR) 60 MG 24 hr tablet Take 1 tablet (60 mg total) by mouth daily. 07/08/14   Dayna N Dunn, PA-C  ketoconazole (NIZORAL) 2 % cream Apply topically 2 (two) times daily. 08/17/14   Historical Provider, MD  levothyroxine (SYNTHROID, LEVOTHROID) 125 MCG tablet Take 125 mcg by mouth daily before breakfast.    Historical Provider, MD  losartan (COZAAR) 100 MG tablet Take 1 tablet (100 mg total) by mouth daily. 08/27/14   Mihai Croitoru, MD  Multiple Vitamins-Minerals (CENTRUM SILVER ADULT 50+) TABS Take by mouth.    Historical Provider, MD  nitroGLYCERIN (NITROSTAT) 0.4 MG SL tablet Place 1 tablet (0.4 mg total) under the tongue every 5 (five) minutes as needed for chest pain. 02/26/13   Erlene Quan, PA-C  pravastatin (PRAVACHOL) 20 MG tablet Take 20 mg by mouth daily.    Historical Provider, MD  TURMERIC CURCUMIN PO Take 1 tablet by mouth daily.    Historical Provider, MD  Vitamin D-Vitamin K (D3 + K2 DOTS PO) Take 1 tablet by mouth daily.    Historical Provider, MD   BP 91/46 mmHg  Pulse 92  Temp(Src) 97.8 F (36.6 C) (Oral)  Resp 18  SpO2 94% Physical Exam  Constitutional: She is oriented to person, place, and time. She appears well-developed and well-nourished. No distress.  HENT:  Head: Normocephalic and atraumatic.  Mouth/Throat: Oropharynx is clear and moist.  Eyes: EOM are normal. Pupils are equal, round, and reactive to light.  Neck: Normal range of motion. Neck supple.  Cardiovascular: Normal rate and regular rhythm.  Exam reveals no friction rub.   No murmur heard. Pulmonary/Chest: Effort normal and breath sounds normal. No respiratory distress. She has no wheezes. She has no rales.  Abdominal: Soft. She exhibits no distension. There is no tenderness. There is no rebound.  Musculoskeletal: Normal range of  motion. She exhibits no edema.  Neurological: She is alert and oriented to person, place, and time.  Skin: No rash noted. She is not diaphoretic.  Nursing note and vitals reviewed.   ED Course  Procedures (including critical care time) Labs Review Labs Reviewed  PROTIME-INR  APTT  CBC  DIFFERENTIAL  COMPREHENSIVE METABOLIC PANEL  I-STAT [place], ED    Imaging Review No results found.   EKG Interpretation None     CRITICAL CARE Performed by: Osvaldo Shipper   Total critical care time: 30 minutes  Critical care time was exclusive of separately billable procedures and treating other patients.  Critical care was necessary to treat or prevent imminent or life-threatening deterioration.  Critical care was time spent personally by me on the following activities: development of treatment plan with patient and/or surrogate as well as nursing,  discussions with consultants, evaluation of patient's response to treatment, examination of patient, obtaining history from patient or surrogate, ordering and performing treatments and interventions, ordering and review of laboratory studies, ordering and review of radiographic studies, pulse oximetry and re-evaluation of patient's condition.  MDM   Final diagnoses:  Left leg pain  Ischemia of extremity    59F presents with left leg weakness. In triage, concern for stroke, stroke orderset used. Patient on my exam has blue and dusky left leg with pain. Unable to bear weight on leg. Normal sensation, normal strength, however pain with bearing weight. No appreciable swelling noted. Normal pulses, however sluggish cap refill when compared to contralateral side.  No hx of blood clots, does have hx of venous insufficiency.  No pain on palpation of leg, however entire L leg color is dusky and dark. Heparin initiated emergently. Vascular Surgery consulted. Husband helped her get dressed this morning and stated her leg was not like this this  morning. Vascular Surgery unable to see patient - they are in the OR with a critical Aortic Dissection. Dr. Trula Slade felt patient should be transferred to another center. [facility] accepting patient. I spoke with ED attending Dr. Curly Rim, and with the Vascular Surgeon on call. Unable to get CT Angio here due to contrast alelrgy, stated she was deathly ill and felt flushed.    [name], MD 09/05/14 [phone]

## 2014-09-05 NOTE — ED Notes (Signed)
Pt reports hx of back problems. Had cervical injection this week and was feeling fine, pt reports waking up with pain to left hip and then had left leg weakness/numbness, reports difficulty ambulating.

## 2014-09-05 NOTE — ED Notes (Signed)
Dr.Walden at the bedside.  

## 2014-09-05 NOTE — Progress Notes (Addendum)
ANTICOAGULATION CONSULT NOTE - Initial Consult  Pharmacy Consult for Heparin Indication: r/o VTE  Allergies  Allergen Reactions  . Atorvastatin Anaphylaxis and Other (See Comments)    Rxn unknown  . Latex Other (See Comments)    Rxn is blisters  . Shellfish Allergy Nausea And Vomiting  . Eggs Or Egg-Derived Products   . Ranexa [Ranolazine]     Constipation   . Ciprofloxacin Hives  . Codeine Nausea And Vomiting  . Contrast Media [Iodinated Diagnostic Agents] Other (See Comments)    Flushing  . Gelnique [Oxybutynin] Other (See Comments)    Rxn is blisters  . Penicillins Hives   Patient Measurements: Ht. 65 inches Wt. ~ 93 kg (from 07/2014) IBW:  ~ 57 kg Heparin Dosing Weight: 74.1 kg  Vital Signs: Temp: 97.8 F (36.6 C) (12/20 1406) Temp Source: Oral (12/20 1406) BP: 91/46 mmHg (12/20 1406) Pulse Rate: 92 (12/20 1406)  Labs:  Recent Labs  09/05/14 1435  HGB 14.5  HCT 43.1  PLT 226   CrCl cannot be calculated (Unknown ideal weight.).  Medical History: Past Medical History  Diagnosis Date  . Chronic venous insufficiency     LEA VENOUS, 10/17/2011 - mild reflux in bilateral common femoral veins  . Hyperlipidemia   . Coronary artery disease     a. s/p PCI/BMS to prox LAD and balloon angioplasty to mLAD with suboptimal result in 2000. b. Abnl nuc 2012, cath 09/2011 - showed that the overall territory of potential ischemia was small and attributable to a moderately diseased small second diagonal artery. Med rx.  . Hypertension   . Chronic diastolic CHF (congestive heart failure)   . Hypothyroidism   . Hypertensive heart disease   . CKD (chronic kidney disease), stage III   . Obesity   . Arthritis   . Stroke     pt. states she had "light stroke" in sept. 1980   Assessment: 80yo female presents with c/o of lower extremity weakness.  She has some chronic venous insufficiency and we have been asked to initiate IV heparin until her work up for possible VTE is  complete.  Goal of Therapy:  Heparin level 0.3-0.7 units/ml Monitor platelets by anticoagulation protocol: Yes   Plan:  Heparin 5000 units IV bolus x 1  Heparin infusion at 1200 units/hr F/U 6 hour HL and adjust Daily CBC/HL  Rober Minion, PharmD., MS Clinical Pharmacist Pager:  [phone] Thank you for allowing pharmacy to be part of this patients care team. 09/05/2014,2:59 PM  Addendum: H/H and platelets WNL - continue plan as noted above.

## 2014-09-05 NOTE — ED Notes (Signed)
Troponin results given to Dr. Mingo Amber on Pod A

## 2014-09-14 DIAGNOSIS — I82409 Acute embolism and thrombosis of unspecified deep veins of unspecified lower extremity: Secondary | ICD-10-CM | POA: Insufficient documentation

## 2014-09-14 DIAGNOSIS — D62 Acute posthemorrhagic anemia: Secondary | ICD-10-CM | POA: Insufficient documentation

## 2014-09-14 DIAGNOSIS — I82402 Acute embolism and thrombosis of unspecified deep veins of left lower extremity: Secondary | ICD-10-CM | POA: Insufficient documentation

## 2014-09-16 DIAGNOSIS — R600 Localized edema: Secondary | ICD-10-CM | POA: Insufficient documentation

## 2014-09-17 HISTORY — PX: IVC FILTER INSERTION: CATH118245

## 2014-09-20 ENCOUNTER — Non-Acute Institutional Stay (SKILLED_NURSING_FACILITY): Payer: BLUE CROSS/BLUE SHIELD | Admitting: Internal Medicine

## 2014-09-20 ENCOUNTER — Encounter: Payer: Self-pay | Admitting: Internal Medicine

## 2014-09-20 DIAGNOSIS — K661 Hemoperitoneum: Secondary | ICD-10-CM

## 2014-09-20 DIAGNOSIS — I2510 Atherosclerotic heart disease of native coronary artery without angina pectoris: Secondary | ICD-10-CM

## 2014-09-20 DIAGNOSIS — R5381 Other malaise: Secondary | ICD-10-CM

## 2014-09-20 DIAGNOSIS — E039 Hypothyroidism, unspecified: Secondary | ICD-10-CM

## 2014-09-20 DIAGNOSIS — K683 Retroperitoneal hematoma: Secondary | ICD-10-CM

## 2014-09-20 DIAGNOSIS — E785 Hyperlipidemia, unspecified: Secondary | ICD-10-CM

## 2014-09-20 DIAGNOSIS — K5900 Constipation, unspecified: Secondary | ICD-10-CM

## 2014-09-20 DIAGNOSIS — I82492 Acute embolism and thrombosis of other specified deep vein of left lower extremity: Secondary | ICD-10-CM

## 2014-09-20 DIAGNOSIS — B9620 Unspecified Escherichia coli [E. coli] as the cause of diseases classified elsewhere: Secondary | ICD-10-CM

## 2014-09-20 DIAGNOSIS — R7881 Bacteremia: Secondary | ICD-10-CM

## 2014-09-20 DIAGNOSIS — I10 Essential (primary) hypertension: Secondary | ICD-10-CM

## 2014-09-20 NOTE — Progress Notes (Signed)
Patient ID: Jill Preston, female   DOB: March 31, 1933, 81 y.o.   MRN: [id number]     [facility]   PCP: [facility], MD  Code Status: full code  Allergies  Allergen Reactions  . Atorvastatin Anaphylaxis  . Latex Other (See Comments)    Causes blisters  . Shellfish Allergy Nausea And Vomiting    Severe nausea and vomiting  . Eggs Or Egg-Derived Products Other (See Comments)    Causes eye problems  . Ranexa [Ranolazine] Other (See Comments)    Constipation   . Ciprofloxacin Hives  . Codeine Nausea And Vomiting  . Contrast Media [Iodinated Diagnostic Agents] Other (See Comments)    Flushing  . Gelnique [Oxybutynin] Other (See Comments)    Causes blisters  . Penicillins Hives    Chief Complaint  Patient presents with  . New Admit To SNF     HPI:  81 y/o female patient is here for STR post hospital admission from 09/05/14-09/16/14 with acute onset left lower extremity DVT. She was started on anticoagulation, developed retroperitoneal bleed with hemorrhagic shock. She required blood transfusion and had IVC filter placed. She also had e.coli bacteremia and was started on antibiotics for total of 6 weeks. Her anticoagulation has been on hold for now. She has PMH of CKD, CADs/p PCI/BMS, HTN, hypothyroidism, chronic venous insufficiency She is seen in her room today with her husband present. She complaints of being weak and tired but feels her strength has improved since time of admission. She is working with therapy team. Her appetite is coming back.   Review of Systems:  Constitutional: Negative for fever, chills, diaphoresis.  HENT: Negative for congestion  Eyes: Negative for eye pain, blurred vision, double vision and discharge.  Respiratory: Negative for cough, shortness of breath and wheezing.   Cardiovascular: Negative for chest pain, palpitations. Positive for leg swelling.  Gastrointestinal: Negative for heartburn, nausea,  vomiting, abdominal pain. Had a bowel movement this am.   Genitourinary: Negative for dysuria, hematuria, flank pain.  Musculoskeletal: Negative for falls. Low back pain Skin: Negative for itching, rash.  Neurological: Negative for dizziness, tingling, focal weakness and headaches.  Psychiatric/Behavioral: Negative for depression   Past Medical History  Diagnosis Date  . Chronic venous insufficiency     LEA VENOUS, 10/17/2011 - mild reflux in bilateral common femoral veins  . Hyperlipidemia   . Coronary artery disease     a. s/p PCI/BMS to prox LAD and balloon angioplasty to mLAD with suboptimal result in 2000. b. Abnl nuc 2012, cath 09/2011 - showed that the overall territory of potential ischemia was small and attributable to a moderately diseased small second diagonal artery. Med rx.  . Hypertension   . Chronic diastolic CHF (congestive heart failure)   . Hypothyroidism   . Hypertensive heart disease   . CKD (chronic kidney disease), stage III   . Obesity   . Arthritis   . Stroke     pt. states she had "light stroke" in sept. 1980   Past Surgical History  Procedure Laterality Date  . Cardiac catheterization Left 09/25/2011    Medical management  . Cardiac catheterization Left 03/25/2001    Normal LV function, LAD residual narrowing of less than 10%, normal ramus intermediate, circumflex, and RCA,   . Cardiac catheterization  09/04/1999    LAD, 3x73mm Tetra stent resulting in a reduction of the 80% stenosis to 0% residual  . Coronary stent placement      LAD  .  Bladder surgery  2010    WITH MESH   . Cholecystectomy    . Abdominal hysterectomy    . Rotator cuff repair Right   . Left heart catheterization with coronary angiogram N/A 09/25/2011    Procedure: LEFT HEART CATHETERIZATION WITH CORONARY ANGIOGRAM;  Surgeon: Sanda Klein, MD;  Location: [place] [facility];  Service: Cardiovascular;  Laterality: N/A;   Social History:   reports that she has never smoked. She has never used  smokeless tobacco. She reports that she does not drink alcohol or use illicit drugs.  Family History  Problem Relation Age of Onset  . Cancer Mother   . Heart disease Father   . Heart disease Sister   . Heart disease Brother     Medications: Patient's Medications  New Prescriptions   No medications on file  Previous Medications   ACETAMINOPHEN (TYLENOL) 650 MG CR TABLET    Take 1,300 mg by mouth 2 (two) times daily.   ASPIRIN EC 81 MG TABLET    Take 1 tablet (81 mg total) by mouth daily.   DICLOFENAC SODIUM (VOLTAREN) 1 % GEL    Apply 1 application topically daily as needed (foot pain).    DOCUSATE SODIUM (COLACE PO)    Take 2 capsules by mouth daily.   FENOFIBRATE 160 MG TABLET    Take 160 mg by mouth daily.     FUROSEMIDE (LASIX) 20 MG TABLET    Take 2 tablets (40 mg total) by mouth daily.   ISOSORBIDE MONONITRATE (IMDUR) 60 MG 24 HR TABLET    Take 1 tablet (60 mg total) by mouth daily.   LEVOTHYROXINE (SYNTHROID, LEVOTHROID) 125 MCG TABLET    Take 125 mcg by mouth daily before breakfast.   LOSARTAN (COZAAR) 100 MG TABLET    Take 1 tablet (100 mg total) by mouth daily.   MELATONIN 3 MG CAPS    Take 1 capsule by mouth at bedtime as needed.   MULTIPLE VITAMIN (MULTIVITAMIN WITH MINERALS) TABS TABLET    Take 1 tablet by mouth daily. Centrum 50+   NITROGLYCERIN (NITROSTAT) 0.4 MG SL TABLET    Place 1 tablet (0.4 mg total) under the tongue every 5 (five) minutes as needed for chest pain.   PRAVASTATIN (PRAVACHOL) 20 MG TABLET    Take 20 mg by mouth daily.   SULFAMETHOXAZOLE-TRIMETHOPRIM (BACTRIM DS,SEPTRA DS) 800-160 MG PER TABLET    Take 1 tablet by mouth 2 (two) times daily. Until 10/26/13 for e.coli bacteremia  Modified Medications   No medications on file  Discontinued Medications   FLUTICASONE (CUTIVATE) 0.05 % CREAM    Apply 1 application topically 2 (two) times daily. Apply with ketoconazole cream followed by Desitin   KETOCONAZOLE (NIZORAL) 2 % CREAM    Apply 1 application  topically 2 (two) times daily. Apply with fluticasone cream followed by Desitin   LIVER OIL-ZINC OXIDE (DESITIN) 40 % OINTMENT    Apply 1 application topically 2 (two) times daily. Apply to vaginal area over fluticasone cream and ketoconazole cream   OVER THE COUNTER MEDICATION    Apply 1 application topically at bedtime. Fungal OTC medication - for toes   TURMERIC CURCUMIN PO    Take 1 tablet by mouth daily.     Physical Exam: Filed Vitals:   09/20/14 1419  BP: 125/60  Pulse: 78  Temp: 98 F (36.7 C)  Resp: 17  SpO2: 91%    General- elderly female in no acute distress Head- atraumatic, normocephalic Eyes- no pallor,  no icterus, no discharge Neck- no cervical lymphadenopathy Throat- moist mucus membrane Cardiovascular- normal s1,s2, no murmurs, palpable dorsalis pedis Respiratory- bilateral clear to auscultation, no wheeze, no rhonchi, no crackles, no use of accessory muscles Abdomen- bowel sounds present, soft, non tender Musculoskeletal- able to move all 4 extremities, generalized weakness, on a recliner, trace edema right leg and 2+ edema left leg. Using walker, right arm picc line Neurological- no focal deficit Skin- warm and dry, easy bruising, left back resolving ecchymoses Psychiatry- alert and oriented to person, place and time, normal mood and affect    Labs reviewed: Basic Metabolic Panel:  Recent Labs  07/12/14 1318 08/25/14 0901 09/05/14 1435  NA 139 139 136*  K 4.8 3.7 3.5*  CL 99 97 90*  CO2 31 32 28  GLUCOSE 104* 105* 132*  BUN 25* 35* 43*  CREATININE 1.63* 1.47* 2.10*  CALCIUM 10.3 10.3 10.9*   Liver Function Tests:  Recent Labs  07/04/14 0923 09/05/14 1435  AST 20 30  ALT 14 20  ALKPHOS 56 43  BILITOT 0.8 0.7  PROT 6.8 7.3  ALBUMIN 3.7 4.0   No results for input(s): LIPASE, AMYLASE in the last 8760 hours. No results for input(s): AMMONIA in the last 8760 hours. CBC:  Recent Labs  07/04/14 0923 09/05/14 1435  WBC 7.6 12.2*    NEUTROABS 4.9 10.0*  HGB 14.3 14.5  HCT 42.6 43.1  MCV 94.5 94.9  PLT 186 226   Cardiac Enzymes:  Recent Labs  07/04/14 0923  TROPONINI <0.30    Radiological Exams: 09/10/14 cxr- no acute cardiopulmonary findings 09/11/14 ct abdomen/ pelvis- large left retroperitoneal hematoma, involvement of left iliacus muscle, cirrhotic liver morphology   Assessment/Plan  Physical deconditioning Will have patient work with PT/OT as tolerated to regain strength and restore function.  Fall precautions are in place.  Acute DVT LLE dvt, off anticoagulation for now, has IVC filter in place. Consider resuming anticoagulation after 6 weeks. Continue lasix 20 mg bid for left leg edema. Continue left groin skin check and has f/u with [facility] 10/25/14. Consider restart of anticoagulation during the visit  Retroperitoneal bleed S/p anticoagulation, off anticoagulants at present, s/p prbc transfusion. Monitor h&h  E.coli bacteremia Continue bactrim ds bid until 10/26/14, monitor wbc and temp curve. Continue picc line care  Insomnia Continue prn melatonin for now  HTN bp stable, continue losartan 25 mg daily for now, titrate up if needed, monitor bp readings  CAD S/p stent, remains chest pain free. Continue aspirin, losartan, imdur, statin and prn NTG  HLD Stable, continue fenofibrate and pravastatin 20 mg daily  Constipation Stable, continue colace 200 mg daily  Hypothyroidism Continue levothyroxine 125 mcg daily    Family/ staff Communication: reviewed care plan with patient and nursing supervisor   Goals of care: short term rehabilitation    Labs/tests ordered: cbc, bmp    Blanchie Serve, MD  [facility] [phone] (Monday-Friday 8 am - 5 pm) [phone] (afterhours)

## 2014-09-23 ENCOUNTER — Non-Acute Institutional Stay (SKILLED_NURSING_FACILITY): Payer: Commercial Managed Care - HMO | Admitting: Adult Health

## 2014-09-23 ENCOUNTER — Encounter: Payer: Self-pay | Admitting: Adult Health

## 2014-09-23 DIAGNOSIS — J309 Allergic rhinitis, unspecified: Secondary | ICD-10-CM

## 2014-09-23 DIAGNOSIS — R609 Edema, unspecified: Secondary | ICD-10-CM

## 2014-09-23 NOTE — Progress Notes (Signed)
Patient ID: Jill Preston, female   DOB: 04-03-1933, 81 y.o.   MRN: [id number]   09/23/2014  Facility:  Nursing Home Location:  [place] Room Number: 308-P LEVEL OF CARE:  SNF (31)   Chief Complaint  Patient presents with  . Acute Visit    Edema and Allergic Rhinitis    HISTORY OF PRESENT ILLNESS:  This is a 81 year old female who was noted to have LLE edema 3+. She is currently taking Lasix 20 mg BID. She, also, complained of clear nasal drip and reported taking Claritin @ home.  She had an acute onset of LLE DVT. She was started on anticoagulant and complicated with retroperitoneal bleed with hemorrhagic shock. She had blood transfusion and IVC filter placement. She, also, had E. Coli bacteriemia and was treated with antibiotic X 6 weeks. She is here for a short-term rehabilitation.   PAST MEDICAL HISTORY:  Past Medical History  Diagnosis Date  . Chronic venous insufficiency     LEA VENOUS, 10/17/2011 - mild reflux in bilateral common femoral veins  . Hyperlipidemia   . Coronary artery disease     a. s/p PCI/BMS to prox LAD and balloon angioplasty to mLAD with suboptimal result in 2000. b. Abnl nuc 2012, cath 09/2011 - showed that the overall territory of potential ischemia was small and attributable to a moderately diseased small second diagonal artery. Med rx.  . Hypertension   . Chronic diastolic CHF (congestive heart failure)   . Hypothyroidism   . Hypertensive heart disease   . CKD (chronic kidney disease), stage III   . Obesity   . Arthritis   . Stroke     pt. states she had "light stroke" in sept. 1980    CURRENT MEDICATIONS: Reviewed per MAR/see medication list  Allergies  Allergen Reactions  . Atorvastatin Anaphylaxis  . Latex Other (See Comments)    Causes blisters  . Shellfish Allergy Nausea And Vomiting    Severe nausea and vomiting  . Eggs Or Egg-Derived Products Other (See Comments)    Causes eye problems  . Ranexa  [Ranolazine] Other (See Comments)    Constipation   . Ciprofloxacin Hives  . Codeine Nausea And Vomiting  . Contrast Media [Iodinated Diagnostic Agents] Other (See Comments)    Flushing  . Gelnique [Oxybutynin] Other (See Comments)    Causes blisters  . Penicillins Hives     REVIEW OF SYSTEMS:  GENERAL: no change in appetite, no fatigue, no weight changes, no fever, chills or weakness RESPIRATORY: no cough, SOB, DOE, wheezing, hemoptysis CARDIAC: no chest pain, or palpitations GI: no abdominal pain, diarrhea, constipation, heart burn, nausea or vomiting  PHYSICAL EXAMINATION  GENERAL: no acute distress, obese EYES: conjunctivae normal, sclerae normal, normal eye lids NECK: supple, trachea midline, no neck masses, no thyroid tenderness, no thyromegaly LYMPHATICS: no LAN in the neck, no supraclavicular LAN RESPIRATORY: breathing is even & unlabored, BS CTAB CARDIAC: RRR, no murmur,no extra heart sounds, LLE edema 3+ GI: abdomen soft, normal BS, no masses, no tenderness, no hepatomegaly, no splenomegaly EXTREMITIES:  Able to move X 4 extremities PSYCHIATRIC: the patient is alert & oriented to person, affect & behavior appropriate  LABS/RADIOLOGY: Labs reviewed: Basic Metabolic Panel:  Recent Labs  07/12/14 1318 08/25/14 0901 09/05/14 1435  NA 139 139 136*  K 4.8 3.7 3.5*  CL 99 97 90*  CO2 31 32 28  GLUCOSE 104* 105* 132*  BUN 25* 35* 43*  CREATININE 1.63* 1.47*  2.10*  CALCIUM 10.3 10.3 10.9*   Liver Function Tests:  Recent Labs  07/04/14 0923 09/05/14 1435  AST 20 30  ALT 14 20  ALKPHOS 56 43  BILITOT 0.8 0.7  PROT 6.8 7.3  ALBUMIN 3.7 4.0   CBC:  Recent Labs  07/04/14 0923 09/05/14 1435  WBC 7.6 12.2*  NEUTROABS 4.9 10.0*  HGB 14.3 14.5  HCT 42.6 43.1  MCV 94.5 94.9  PLT 186 226   Cardiac Enzymes:  Recent Labs  07/04/14 0923  TROPONINI <0.30    ASSESSMENT/PLAN:  Edema - give Lasix 20 mg PO X 1 and continue Lasix 20 mg PO  BID  Allergic Rhinitis - start Claritin 10 mg 1 tab PO Q D   Goals of care:  Short-term rehabilitation   Labs/test ordered:  BMP next lab draw    CPT CODE: 16109    MEDINA-VARGAS,Loran Auguste, NP [facility] [phone]

## 2014-09-27 ENCOUNTER — Encounter: Payer: Self-pay | Admitting: Adult Health

## 2014-09-27 ENCOUNTER — Non-Acute Institutional Stay (SKILLED_NURSING_FACILITY): Payer: Commercial Managed Care - HMO | Admitting: Adult Health

## 2014-09-27 DIAGNOSIS — B001 Herpesviral vesicular dermatitis: Secondary | ICD-10-CM

## 2014-09-27 NOTE — Progress Notes (Signed)
Patient ID: Jill Preston, female   DOB: 1932/11/21, 81 y.o.   MRN: [id number]   09/27/2014  Facility:  Nursing Home Location:  [place] Room Number: 308-P LEVEL OF CARE:  SNF (31)   Chief Complaint  Patient presents with  . Acute Visit    Fever Blisters    HISTORY OF PRESENT ILLNESS:  This is a 81 year old female who was noted to have blisters on her upper lip. No fever reported. She is here for a short-term rehabilitation.  PAST MEDICAL HISTORY:  Past Medical History  Diagnosis Date  . Chronic venous insufficiency     LEA VENOUS, 10/17/2011 - mild reflux in bilateral common femoral veins  . Hyperlipidemia   . Coronary artery disease     a. s/p PCI/BMS to prox LAD and balloon angioplasty to mLAD with suboptimal result in 2000. b. Abnl nuc 2012, cath 09/2011 - showed that the overall territory of potential ischemia was small and attributable to a moderately diseased small second diagonal artery. Med rx.  . Hypertension   . Chronic diastolic CHF (congestive heart failure)   . Hypothyroidism   . Hypertensive heart disease   . CKD (chronic kidney disease), stage III   . Obesity   . Arthritis   . Stroke     pt. states she had "light stroke" in sept. 1980    CURRENT MEDICATIONS: Reviewed per MAR/see medication list  Allergies  Allergen Reactions  . Atorvastatin Anaphylaxis  . Latex Other (See Comments)    Causes blisters  . Shellfish Allergy Nausea And Vomiting    Severe nausea and vomiting  . Eggs Or Egg-Derived Products Other (See Comments)    Causes eye problems  . Ranexa [Ranolazine] Other (See Comments)    Constipation   . Ciprofloxacin Hives  . Codeine Nausea And Vomiting  . Contrast Media [Iodinated Diagnostic Agents] Other (See Comments)    Flushing  . Gelnique [Oxybutynin] Other (See Comments)    Causes blisters  . Penicillins Hives     REVIEW OF SYSTEMS:  GENERAL: no change in appetite, no fatigue, no weight  changes, no fever, chills or weakness RESPIRATORY: no cough, SOB, DOE, wheezing, hemoptysis CARDIAC: no chest pain, or palpitations GI: no abdominal pain, diarrhea, constipation, heart burn, nausea or vomiting  PHYSICAL EXAMINATION  GENERAL: no acute distress, obese LIPS:  Blisters noted on mid upper lip NECK: supple, trachea midline, no neck masses, no thyroid tenderness, no thyromegaly LYMPHATICS: no LAN in the neck, no supraclavicular LAN RESPIRATORY: breathing is even & unlabored, BS CTAB CARDIAC: RRR, no murmur,no extra heart sounds, LLE edema 3+ GI: abdomen soft, normal BS, no masses, no tenderness, no hepatomegaly, no splenomegaly EXTREMITIES:  Able to move X 4 extremities PSYCHIATRIC: the patient is alert & oriented to person, affect & behavior appropriate  LABS/RADIOLOGY: 09/23/13  WBC 6.9 hemoglobin 10.4 hematocrit 32.9 MCV 100.9 sodium 133 potassium 4.6 glucose 112 BUN 21 creatinine 1.9 calcium 9.3 GFR 27.60 Labs reviewed: Basic Metabolic Panel:  Recent Labs  07/12/14 1318 08/25/14 0901 09/05/14 1435  NA 139 139 136*  K 4.8 3.7 3.5*  CL 99 97 90*  CO2 31 32 28  GLUCOSE 104* 105* 132*  BUN 25* 35* 43*  CREATININE 1.63* 1.47* 2.10*  CALCIUM 10.3 10.3 10.9*   Liver Function Tests:  Recent Labs  07/04/14 0923 09/05/14 1435  AST 20 30  ALT 14 20  ALKPHOS 56 43  BILITOT 0.8 0.7  PROT 6.8 7.3  ALBUMIN 3.7 4.0   CBC:  Recent Labs  07/04/14 0923 09/05/14 1435  WBC 7.6 12.2*  NEUTROABS 4.9 10.0*  HGB 14.3 14.5  HCT 42.6 43.1  MCV 94.5 94.9  PLT 186 226   Cardiac Enzymes:  Recent Labs  07/04/14 0923  TROPONINI <0.30    ASSESSMENT/PLAN:  Fever Blisters - start Abreva 10% cream to upper lip blisters 5X/day 10 days   Goals of care:  Short-term rehabilitation   Labs/test ordered:  none    CPT CODE: 28366    [facility], [place]

## 2014-09-30 ENCOUNTER — Non-Acute Institutional Stay (SKILLED_NURSING_FACILITY): Payer: Commercial Managed Care - HMO | Admitting: Adult Health

## 2014-09-30 ENCOUNTER — Encounter: Payer: Self-pay | Admitting: Adult Health

## 2014-09-30 DIAGNOSIS — R5381 Other malaise: Secondary | ICD-10-CM

## 2014-09-30 DIAGNOSIS — N183 Chronic kidney disease, stage 3 unspecified: Secondary | ICD-10-CM

## 2014-09-30 DIAGNOSIS — E039 Hypothyroidism, unspecified: Secondary | ICD-10-CM

## 2014-09-30 DIAGNOSIS — R7881 Bacteremia: Secondary | ICD-10-CM

## 2014-09-30 DIAGNOSIS — I2510 Atherosclerotic heart disease of native coronary artery without angina pectoris: Secondary | ICD-10-CM

## 2014-09-30 DIAGNOSIS — I82492 Acute embolism and thrombosis of other specified deep vein of left lower extremity: Secondary | ICD-10-CM

## 2014-09-30 DIAGNOSIS — G4700 Insomnia, unspecified: Secondary | ICD-10-CM

## 2014-09-30 DIAGNOSIS — J309 Allergic rhinitis, unspecified: Secondary | ICD-10-CM

## 2014-09-30 DIAGNOSIS — R609 Edema, unspecified: Secondary | ICD-10-CM

## 2014-09-30 DIAGNOSIS — E785 Hyperlipidemia, unspecified: Secondary | ICD-10-CM

## 2014-09-30 DIAGNOSIS — I10 Essential (primary) hypertension: Secondary | ICD-10-CM

## 2014-09-30 NOTE — Progress Notes (Signed)
Patient ID: Jill Preston, female   DOB: [birth year], 81 y.o.   MRN: [id number]   09/30/2014  Facility:  Nursing Home Location:  [place] Room Number: 308-P LEVEL OF CARE:  SNF (31)   Chief Complaint  Patient presents with  . Discharge Note    Physical deconditioning, Escherichia coli bacteremia, insomnia, hypertension, CAD, hyperlipidemia, hypothyroidism, edema of lower extremity and CKD stage III    HISTORY OF PRESENT ILLNESS:  This is a 81 year old female who is for discharge home with home health PT and OT. She has past medical history of CAD, hypothyroidism and hypertension. She has been admitted to [facility] on 09/16/14 from [facility]. She had an acute onset of LLE DVT. She was started on anticoagulant and complicated with retroperitoneal bleed with hemorrhagic shock. She had blood transfusion and IVC filter placement. She, also, had E. Coli bacteriemia and was treated with antibiotic X 6 weeks. Patient was admitted to this facility for short-term rehabilitation after the patient's recent hospitalization.  Patient has completed SNF rehabilitation and therapy has cleared the patient for discharge.  PAST MEDICAL HISTORY:  Past Medical History  Diagnosis Date  . Chronic venous insufficiency     LEA VENOUS, 10/17/2011 - mild reflux in bilateral common femoral veins  . Hyperlipidemia   . Coronary artery disease     a. s/p PCI/BMS to prox LAD and balloon angioplasty to mLAD with suboptimal result in 2000. b. Abnl nuc 2012, cath 09/2011 - showed that the overall territory of potential ischemia was small and attributable to a moderately diseased small second diagonal artery. Med rx.  . Hypertension   . Chronic diastolic CHF (congestive heart failure)   . Hypothyroidism   . Hypertensive heart disease   . CKD (chronic kidney disease), stage III   . Obesity   . Arthritis   . Stroke     pt. states she had "light  stroke" in sept. 1980    CURRENT MEDICATIONS: Reviewed per MAR/see medication list  Allergies  Allergen Reactions  . Atorvastatin Anaphylaxis  . Latex Other (See Comments)    Causes blisters  . Shellfish Allergy Nausea And Vomiting    Severe nausea and vomiting  . Eggs Or Egg-Derived Products Other (See Comments)    Causes eye problems  . Ranexa [Ranolazine] Other (See Comments)    Constipation   . Ciprofloxacin Hives  . Codeine Nausea And Vomiting  . Contrast Media [Iodinated Diagnostic Agents] Other (See Comments)    Flushing  . Gelnique [Oxybutynin] Other (See Comments)    Causes blisters  . Penicillins Hives     REVIEW OF SYSTEMS:  GENERAL: no change in appetite, no fatigue, no weight changes, no fever, chills or weakness RESPIRATORY: no cough, SOB, DOE, wheezing, hemoptysis CARDIAC: no chest pain, or palpitations GI: no abdominal pain, diarrhea, constipation, heart burn, nausea or vomiting  PHYSICAL EXAMINATION  GENERAL: no acute distress, obese NECK: supple, trachea midline, no neck masses, no thyroid tenderness, no thyromegaly LYMPHATICS: no LAN in the neck, no supraclavicular LAN RESPIRATORY: breathing is even & unlabored, BS CTAB CARDIAC: RRR, no murmur,no extra heart sounds, LLE edema 3+ GI: abdomen soft, normal BS, no masses, no tenderness, no hepatomegaly, no splenomegaly EXTREMITIES:  Able to move X 4 extremities PSYCHIATRIC: the patient is alert & oriented to person, affect & behavior appropriate  LABS/RADIOLOGY: 09/23/13  WBC 6.9 hemoglobin 10.4 hematocrit 32.9 MCV 100.9 sodium 133 potassium 4.6 glucose 112  BUN 21 creatinine 1.9 calcium 9.3 GFR 27.60 Labs reviewed: Basic Metabolic Panel:  Recent Labs  07/12/14 1318 08/25/14 0901 09/05/14 1435  NA 139 139 136*  K 4.8 3.7 3.5*  CL 99 97 90*  CO2 31 32 28  GLUCOSE 104* 105* 132*  BUN 25* 35* 43*  CREATININE 1.63* 1.47* 2.10*  CALCIUM 10.3 10.3 10.9*   Liver Function Tests:  Recent Labs   07/04/14 0923 09/05/14 1435  AST 20 30  ALT 14 20  ALKPHOS 56 43  BILITOT 0.8 0.7  PROT 6.8 7.3  ALBUMIN 3.7 4.0   CBC:  Recent Labs  07/04/14 0923 09/05/14 1435  WBC 7.6 12.2*  NEUTROABS 4.9 10.0*  HGB 14.3 14.5  HCT 42.6 43.1  MCV 94.5 94.9  PLT 186 226   Cardiac Enzymes:  Recent Labs  07/04/14 0923  TROPONINI <0.30    ASSESSMENT/PLAN:  Physical deconditioning - for home health PT and OT Acute DVT, LLE - off anticoagulation for now; IVC filter in place; follow-up with the [facility] on 10/25/14 Escherichia coli bacteremia - continue Bactrim DS 1 tab by mouth twice a day by mouth 2/09/ Insomnia - continue melatonin 3 mg by mouth daily at bedtime Hypertension - well controlled; continue losartan 25 mg by mouth daily CAD S/P stent - stable; continue aspirin 81 mg daily, losartan 25 mg daily, Imdur 60 mg ER daily and nitroglycerin when necessary Dyslipidemia - continue pravastatin 20 mg by mouth daily and TriCor 160 mg by mouth daily Hypothyroidism - TSH 0.350; continue levothyroxine 125 g daily Edema, LLE - continue Lasix 20 mg 1 tab by mouth twice a day; monitory BMP Allergic rhinitis - continue Claritin 10 mg by mouth daily Chronic kidney disease, stage III - creatinine 1.9; stable    I have filled out patient's discharge paperwork and written prescriptions.  Patient will receive home health PT and OT.  Total discharge time: Greater than 30 minutes  Discharge time involved coordination of the discharge process with social worker, nursing staff and therapy department. Medical justification for home health services verified.    [facility], NP [employer] [phone]

## 2014-10-01 ENCOUNTER — Encounter: Payer: Self-pay | Admitting: Cardiovascular Disease

## 2014-10-01 NOTE — Telephone Encounter (Signed)
This encounter was created in error - please disregard.

## 2014-10-02 DIAGNOSIS — N189 Chronic kidney disease, unspecified: Secondary | ICD-10-CM | POA: Diagnosis not present

## 2014-10-02 DIAGNOSIS — I82402 Acute embolism and thrombosis of unspecified deep veins of left lower extremity: Secondary | ICD-10-CM | POA: Diagnosis not present

## 2014-10-02 DIAGNOSIS — I509 Heart failure, unspecified: Secondary | ICD-10-CM | POA: Diagnosis not present

## 2014-10-02 DIAGNOSIS — I129 Hypertensive chronic kidney disease with stage 1 through stage 4 chronic kidney disease, or unspecified chronic kidney disease: Secondary | ICD-10-CM | POA: Diagnosis not present

## 2014-10-12 ENCOUNTER — Telehealth: Payer: Self-pay | Admitting: Cardiovascular Disease

## 2014-10-12 NOTE — Telephone Encounter (Signed)
SPOKE TO WANDA-OCUPATIONAL THERAPIST WANDA STATES SHE WANTED TO KNOW IF PATIENT SHOULD BE TAKING COL - RITE-(LAXATIVE )  RN INFORMED WANDA- LAST VISIT SHE WAS TAKING COLACE MEDICATION. THIS MEDICATION WOULD BE FOLLOWED BY PRIMARY WANDA STATES THEY FOUND MEDICATION ,SHE IS TAKING IT

## 2014-11-03 ENCOUNTER — Other Ambulatory Visit: Payer: Self-pay | Admitting: Nephrology

## 2014-11-03 DIAGNOSIS — N183 Chronic kidney disease, stage 3 (moderate): Secondary | ICD-10-CM

## 2014-11-05 ENCOUNTER — Ambulatory Visit
Admission: RE | Admit: 2014-11-05 | Discharge: 2014-11-05 | Disposition: A | Discharge status: Home / Self Care | Payer: Commercial Managed Care - HMO | Source: Ambulatory Visit | Attending: Nephrology | Admitting: Nephrology

## 2014-11-05 DIAGNOSIS — N183 Chronic kidney disease, stage 3 (moderate): Secondary | ICD-10-CM

## 2014-11-17 ENCOUNTER — Telehealth (HOSPITAL_COMMUNITY): Payer: Self-pay | Admitting: Cardiovascular Disease

## 2014-11-17 NOTE — Telephone Encounter (Signed)
Received records from Dr Rachell Cipro for appointment on 11/19/14 with Dr Sallyanne Kuster.  Records given to [facility] (medical records) for Dr Croitoru's schedule on 11/19/14.  lp

## 2014-11-19 ENCOUNTER — Ambulatory Visit (INDEPENDENT_AMBULATORY_CARE_PROVIDER_SITE_OTHER): Payer: Commercial Managed Care - HMO | Admitting: Cardiovascular Disease

## 2014-11-19 ENCOUNTER — Encounter: Payer: Self-pay | Admitting: Cardiovascular Disease

## 2014-11-19 VITALS — BP 140/80 | HR 84 | Resp 16 | Ht 65.5 in | Wt 208.9 lb

## 2014-11-19 DIAGNOSIS — I509 Heart failure, unspecified: Secondary | ICD-10-CM

## 2014-11-19 DIAGNOSIS — I10 Essential (primary) hypertension: Secondary | ICD-10-CM

## 2014-11-19 DIAGNOSIS — I708 Atherosclerosis of other arteries: Secondary | ICD-10-CM

## 2014-11-19 DIAGNOSIS — N183 Chronic kidney disease, stage 3 unspecified: Secondary | ICD-10-CM

## 2014-11-19 DIAGNOSIS — I2510 Atherosclerotic heart disease of native coronary artery without angina pectoris: Secondary | ICD-10-CM

## 2014-11-19 DIAGNOSIS — I110 Hypertensive heart disease with heart failure: Secondary | ICD-10-CM

## 2014-11-19 DIAGNOSIS — E785 Hyperlipidemia, unspecified: Secondary | ICD-10-CM

## 2014-11-19 DIAGNOSIS — I771 Stricture of artery: Secondary | ICD-10-CM

## 2014-11-19 DIAGNOSIS — I82402 Acute embolism and thrombosis of unspecified deep veins of left lower extremity: Secondary | ICD-10-CM

## 2014-11-19 NOTE — Patient Instructions (Signed)
Dr. Croitoru recommends that you schedule a follow-up appointment in: 6 months    

## 2014-11-21 ENCOUNTER — Encounter: Payer: Self-pay | Admitting: Cardiovascular Disease

## 2014-11-21 NOTE — Progress Notes (Signed)
Patient ID: Jill Preston, female   DOB: June 25, 1933, 81 y.o.   MRN: [id number]      Cardiology Office Note   Date:  11/21/2014   ID:  Jill Preston, DOB 09-Dec-1932, MRN [id number]  PCP:  Rachell Cipro, MD  Cardiologist:   Sanda Klein, MD   Chief Complaint  Patient presents with  . Follow-up    3 months: Recent hospitalization for blood clots left leg and sepsis.      History of Present Illness: Jill Preston is a 81 y.o. female who presents for CAD, HTN with chronic diastolic HF, chronic venous insufficiency due to bilateral common femoral vein reflux and PAD.  She was critically ill and admitted to the ICU at [facility] in late December. She presented with acute iliofemoral left leg DVT. Anticoagulation was complicated by retroperitoneal hematoma (an IVC filter was placed) and hemorrhagic shock, subsequently sepsis due to E.coli UTI, transient renal failure and CHF exacerbation. Imaging studies suggested possible cirrhosis (?NASH -related). Reevaluation for removal of the IVC filter showed residual bilateral DVT and this has been delayed for May.  She is much better and really seems to be back to her usual self. She is bothered most by inguinal intertrigo, not helped by nystatin ointment. Ketoconazole cream helped better.  She has CAD (s/p PCI/BMS to proximal LAD and balloon angioplasty to mid LAD, moderate diag disease in 2013). She had an abnormal stress test in 2012 and last cath in 09/2011 showed that the overall territory of potential ischemia was small and attributable to a moderately diseased small second diagonal artery. She responded to addition of Ranexa but this was stopped due to severe constipation. She has done better on an increased dose of Imdur. She was admitted 10/18-10/22 with acute diastolic HF. 2D Echo 66/44/03 showed EF 65-70%, mild MR.   Hard to know what he dry weight is now. She probably lost substantial weight during her ICU stay, but then  had fluid overload. Gained weight back.  Past Medical History  Diagnosis Date  . Chronic venous insufficiency     LEA VENOUS, 10/17/2011 - mild reflux in bilateral common femoral veins  . Hyperlipidemia   . Coronary artery disease     a. s/p PCI/BMS to prox LAD and balloon angioplasty to mLAD with suboptimal result in 2000. b. Abnl nuc 2012, cath 09/2011 - showed that the overall territory of potential ischemia was small and attributable to a moderately diseased small second diagonal artery. Med rx.  . Hypertension   . Chronic diastolic CHF (congestive heart failure)   . Hypothyroidism   . Hypertensive heart disease   . CKD (chronic kidney disease), stage III   . Obesity   . Arthritis   . Stroke     pt. states she had "light stroke" in sept. 1980    Past Surgical History  Procedure Laterality Date  . Cardiac catheterization Left 09/25/2011    Medical management  . Cardiac catheterization Left 03/25/2001    Normal LV function, LAD residual narrowing of less than 10%, normal ramus intermediate, circumflex, and RCA,   . Cardiac catheterization  09/04/1999    LAD, 3x73mm Tetra stent resulting in a reduction of the 80% stenosis to 0% residual  . Coronary stent placement      LAD  . Bladder surgery  2010    WITH MESH   . Cholecystectomy    . Abdominal hysterectomy    . Rotator cuff repair Right   . Left heart catheterization  with coronary angiogram N/A 09/25/2011    Procedure: LEFT HEART CATHETERIZATION WITH CORONARY ANGIOGRAM;  Surgeon: Sanda Klein, MD;  Location: [place] CATH LAB;  Service: Cardiovascular;  Laterality: N/A;     Current Outpatient Prescriptions  Medication Sig Dispense Refill  . acetaminophen (TYLENOL) 650 MG CR tablet Take 1,300 mg by mouth 2 (two) times daily.    Marland Kitchen aspirin EC 81 MG tablet Take 1 tablet (81 mg total) by mouth daily. 90 tablet 3  . diclofenac sodium (VOLTAREN) 1 % GEL Apply 1 application topically daily as needed (foot pain).     Jill Preston Sodium  (COLACE PO) Take 2 capsules by mouth daily.    . fenofibrate 160 MG tablet Take 160 mg by mouth daily.      . furosemide (LASIX) 20 MG tablet Take 2 tablets (40 mg total) by mouth daily. (Patient taking differently: Take 20 mg by mouth daily. 1/2 in the afternoon as needed for swelling.) 60 tablet 12  . isosorbide mononitrate (IMDUR) 60 MG 24 hr tablet Take 1 tablet (60 mg total) by mouth daily. 90 tablet 2  . levothyroxine (SYNTHROID, LEVOTHROID) 125 MCG tablet Take 125 mcg by mouth daily before breakfast.    . losartan (COZAAR) 100 MG tablet Take 1 tablet (100 mg total) by mouth daily. (Patient taking differently: Take 25 mg by mouth daily. ) 30 tablet 9  . Melatonin 3 MG CAPS Take 1 capsule by mouth at bedtime as needed.    . Multiple Vitamin (MULTIVITAMIN WITH MINERALS) TABS tablet Take 1 tablet by mouth daily. Centrum 50+    . nitroGLYCERIN (NITROSTAT) 0.4 MG SL tablet Place 1 tablet (0.4 mg total) under the tongue every 5 (five) minutes as needed for chest pain. 25 tablet 2  . pravastatin (PRAVACHOL) 20 MG tablet Take 20 mg by mouth daily.    . Rivaroxaban (XARELTO) 15 MG TABS tablet Take 15 mg by mouth 2 (two) times daily with a meal.     No current facility-administered medications for this visit.    Allergies:   Atorvastatin; Latex; Shellfish allergy; Eggs or egg-derived products; Ranexa; Ciprofloxacin; Codeine; Contrast media; Gelnique; and Penicillins    Social History:  The patient  reports that she has never smoked. She has never used smokeless tobacco. She reports that she does not drink alcohol or use illicit drugs.   Family History:  The patient's family history includes Cancer in her mother; Heart disease in her brother, father, and sister.    ROS:  Please see the history of present illness. She has class 2-3 exertional dyspnea.   Otherwise, review of systems are positive for NONE  The patient specifically denies any chest pain at rest or with exertion, dyspnea at rest or  with light exertion, orthopnea, paroxysmal nocturnal dyspnea, syncope, palpitations, focal neurological deficits, intermittent claudication, unexplained weight gain, cough, hemoptysis or wheezing.  The patient also denies abdominal pain, nausea, vomiting, dysphagia, diarrhea, constipation, polyuria, polydipsia, dysuria, hematuria, frequency, urgency, abnormal bleeding or bruising, fever, chills, unexpected weight changes, mood swings, change in skin or hair texture, change in voice quality, auditory or visual problems, allergic reactions, new musculoskeletal complaints other than usual "aches and pains".    PHYSICAL EXAM: VS:  BP 140/80 mmHg  Pulse 84  Resp 16  Ht 5' 5.5" (1.664 m)  Wt 208 lb 14.4 oz (94.756 kg)  BMI 34.22 kg/m2 , BMI Body mass index is 34.22 kg/(m^2). General: Alert, oriented x3, no distress. Obesity does limit her exam Head:  no evidence of trauma, PERRL, EOMI, no exophtalmos or lid lag, no myxedema, no xanthelasma; normal ears, nose and oropharynx Neck: normal jugular venous pulsations and no hepatojugular reflux; brisk carotid pulses without delay and no carotid bruits Chest: clear to auscultation, no signs of consolidation by percussion or palpation, normal fremitus, symmetrical and full respiratory excursions Cardiovascular: normal position and quality of the apical impulse, regular rhythm, normal first and second heart sounds, no murmurs, rubs or gallops Abdomen: no tenderness or distention, no masses by palpation, no abnormal pulsatility or arterial bruits, normal bowel sounds, no hepatosplenomegaly Extremities: no clubbing, cyanosis; there is 2+ right ankle edema and 2+ left ankle edema; 2+ radial, ulnar and brachial pulses bilaterally; 2+ right femoral, posterior tibial and dorsalis pedis pulses; 2+ left femoral, posterior tibial and dorsalis pedis pulses; no subclavian or femoral bruits Neurological: grossly nonfocal   EKG:  EKG is not ordered today.   Recent  Labs: 07/04/2014: Pro B Natriuretic peptide (BNP) 872.5* 08/25/2014: TSH 0.689 09/05/2014: ALT 20; BUN 43*; Creatinine 2.10*; Hemoglobin 14.5; Platelets 226; Potassium 3.5*; Sodium 136*    Lipid Panel    Component Value Date/Time   CHOL 162 04/18/2013 0826   TRIG 140 04/18/2013 0826   HDL 42 04/18/2013 0826   CHOLHDL 3.9 04/18/2013 0826   VLDL 28 04/18/2013 0826   LDLCALC 92 04/18/2013 0826      Wt Readings from Last 3 Encounters:  11/19/14 208 lb 14.4 oz (94.756 kg)  09/30/14 217 lb (98.431 kg)  09/27/14 217 lb (98.431 kg)      Other studies Reviewed: Additional studies/ records that were reviewed today include: extensive records from [facility], including labs and imaging studies..  ASSESSMENT AND PLAN:  Diastolic heart failure, chronic Functional status remains mediocre. Probably at/close to euvolemia. Dry weight uncertain. Fair BP control, now on lower losartan dose. Na restriction reinforced. CKD stage 3-4.  CAD LAD stent 12/00. Patent '02 and 1/13 Angina is now well controlled. In the past she has not tolerated Ranexa and other meds caused orthostatic hypotension when used in higher doses.  Dyslipidemia Fair lipid profile. Ideally LDL would be <70, but intolerant to more potent statins like atorvastatin.Crestor is too costly. Note report of abdominal MR images suggestive of cirrhosis, but no clinical or biochemical findings to support this.  Venous insufficiency and recent left iliofemoral DVT Recommended improved compliance with compression stockings. Plan for IVC filter removal in May.Continue anticoagulants.  Left subclavian artery stenosis without evidence of subclavian steal Blood pressure should always be checked in the right upper extremity.   Current medicines are reviewed at length with the patient today.  The patient does not have concerns regarding medicines.  The following changes have been made:  no change  Labs/ tests ordered today include: No  orders of the defined types were placed in this encounter.    Patient Instructions  Dr. Sallyanne Kuster recommends that you schedule a follow-up appointment in: 6 months.       Mikael Spray, MD  11/21/2014 11:35 AM    [address], [place], [place]  [address] Phone: [phone]; Fax: [phone]

## 2015-01-13 ENCOUNTER — Telehealth: Payer: Self-pay | Admitting: Cardiovascular Disease

## 2015-01-13 NOTE — Telephone Encounter (Signed)
Pt c/o Shortness Of Breath: STAT if SOB developed within the last 24 hours or pt is noticeably SOB on the phone  1. Are you currently SOB (can you hear that pt is SOB on the phone)? no  2. How long have you been experiencing SOB? Last couple of days   3. Are you SOB when sitting or when up moving around?Moving around   4. Are you currently experiencing any other symptoms? High bp 133/64 pulse 80 , also irregular heartbeat

## 2015-01-13 NOTE — Telephone Encounter (Signed)
Returned call to patient she stated she went out to eat lunch and do some errands today.Stated while in store she became weak,sob,left chest pain.Stated chest pain lasted appox 1 hour.Stated when she got home she was exhausted.B/P 133/64 pulse irregular 80 bpm.Stated she took a nap and feels a little better.Stated she continues to be sob hard to walk from room to room.Stated she has noticed her sob is worse, would like to be seen.Appointment scheduled with Jill Range PA tomorrow 01/14/15 at 8:30 am [facility] office.Advised to go to [facility] ER if needed.

## 2015-01-13 NOTE — Progress Notes (Signed)
Cardiology Office Note   Date:  01/14/2015   ID:  Jill Preston, DOB [birth year], MRN [id number]  PCP:  Rachell Cipro, MD  Cardiologist:  Dr. Sallyanne Kuster   CP and SOB    History of Present Illness: Jill Preston is a 81 y.o. female with a history of CAD s/p PCI/BMS to pLAD and POBA to mLAD, HTN, HLD, chronic diastolic HF, chronic venous insufficiency due to bilateral common femoral vein reflux, CKD, PAD, and left subclavian artery stenosis who presents to clinic today for evaluation of CP and SOB.  She has CAD (s/p PCI/BMS to proximal LAD and balloon angioplasty to mid LAD, moderate diag disease in 2013). She had her last cath in 09/2011 showed that the overall territory of potential ischemia was small and attributable to a moderately diseased small second diagonal artery. She had a LexiScan Myoview 02/2013 which showed a relatively small anterior wall reversible defect. This was felt to correspond quite well with a known stenosis in the second diagonal artery that was left for medical therapy after her last cath. Left ventricular systolic function is normal. She states that the discomfort she felt in her chest during a LexiScan study was identical with her clinical complaints of left-sided chest pain. Unfortunately there was little room to adjust her antianginal is due to low blood pressure. Ranexa seemed to work but was associated with intolerable constipation. Dr Sallyanne Kuster elected to avoid invasive coronary angiography at that time, especially knowing that the diagonal artery stenosis was not felt amenable to PCI.   She was admitted to [facility] from 10/18-10/22/15 with acute diastolic HF. 2D Echo 34/91/79 showed EF 65-70%, mild MR.   She was critically ill and admitted to the ICU at [facility] in 08/2014. She presented with acute iliofemoral left leg DVT. Anticoagulation was complicated by retroperitoneal hematoma (an IVC filter was placed) and hemorrhagic shock, subsequently sepsis due  to E.coli UTI, transient renal failure and CHF exacerbation. Imaging studies suggested possible cirrhosis (?NASH -related). Reevaluation for removal of the IVC filter showed residual bilateral DVT and this has been delayed for May. She is on Xarelto 15mg  BID.   Seen in the office on 11/19/14 and felt to be close to her dry weight at 208 lbs and chronic poor functional statius. She was noted to have class 2-3 exertional dyspnea at that time.   She was added onto my schedule today for evaluation of CP and SOB while running errands yesterday. She had chest pain which lasted ~1 hour. Her chest pain resolved after a nap but she continued to feel exhausted and felt SOB walking from room to room. She has been complaint with all her medications and salt and fluid restrictions.     Past Medical History  Diagnosis Date  . Chronic venous insufficiency     LEA VENOUS, 10/17/2011 - mild reflux in bilateral common femoral veins  . Hyperlipidemia   . Coronary artery disease     a. s/p PCI/BMS to prox LAD and balloon angioplasty to mLAD with suboptimal result in 2000. b. Abnl nuc 2012, cath 09/2011 - showed that the overall territory of potential ischemia was small and attributable to a moderately diseased small second diagonal artery. Med rx.  . Hypertension   . Chronic diastolic CHF (congestive heart failure)   . Hypothyroidism   . Hypertensive heart disease   . CKD (chronic kidney disease), stage III   . Obesity   . Arthritis   . Stroke  pt. states she had "light stroke" in sept. 1980    Past Surgical History  Procedure Laterality Date  . Cardiac catheterization Left 09/25/2011    Medical management  . Cardiac catheterization Left 03/25/2001    Normal LV function, LAD residual narrowing of less than 10%, normal ramus intermediate, circumflex, and RCA,   . Cardiac catheterization  09/04/1999    LAD, 3x41mm Tetra stent resulting in a reduction of the 80% stenosis to 0% residual  . Coronary stent  placement      LAD  . Bladder surgery  2010    WITH MESH   . Cholecystectomy    . Abdominal hysterectomy    . Rotator cuff repair Right   . Left heart catheterization with coronary angiogram N/A 09/25/2011    Procedure: LEFT HEART CATHETERIZATION WITH CORONARY ANGIOGRAM;  Surgeon: Sanda Klein, MD;  Location: [place] CATH LAB;  Service: Cardiovascular;  Laterality: N/A;     Current Outpatient Prescriptions  Medication Sig Dispense Refill  . acetaminophen (TYLENOL) 650 MG CR tablet Take 1,300 mg by mouth 2 (two) times daily.    Marland Kitchen aspirin EC 81 MG tablet Take 1 tablet (81 mg total) by mouth daily. 90 tablet 3  . diclofenac sodium (VOLTAREN) 1 % GEL Apply 1 application topically daily as needed (foot pain).     Mariane Baumgarten Sodium (COLACE PO) Take 2 capsules by mouth daily.    . fenofibrate 160 MG tablet Take 160 mg by mouth daily.      . furosemide (LASIX) 20 MG tablet Take 2 tablets (40 mg total) by mouth daily. (Patient taking differently: Take 20 mg by mouth daily. 1/2 in the afternoon as needed for swelling.) 60 tablet 12  . isosorbide mononitrate (IMDUR) 60 MG 24 hr tablet Take 1 tablet (60 mg total) by mouth daily. 90 tablet 2  . levothyroxine (SYNTHROID, LEVOTHROID) 125 MCG tablet Take 125 mcg by mouth daily before breakfast.    . losartan (COZAAR) 100 MG tablet Take 1 tablet (100 mg total) by mouth daily. (Patient taking differently: Take 25 mg by mouth daily. ) 30 tablet 9  . Melatonin 3 MG CAPS Take 1 capsule by mouth at bedtime as needed.    . Multiple Vitamin (MULTIVITAMIN WITH MINERALS) TABS tablet Take 1 tablet by mouth daily. Centrum 50+    . nitroGLYCERIN (NITROSTAT) 0.4 MG SL tablet Place 1 tablet (0.4 mg total) under the tongue every 5 (five) minutes as needed for chest pain. 25 tablet 2  . pravastatin (PRAVACHOL) 20 MG tablet Take 20 mg by mouth daily.    . Rivaroxaban (XARELTO) 15 MG TABS tablet Take 15 mg by mouth 2 (two) times daily with a meal.     No current  facility-administered medications for this visit.    Allergies:   Atorvastatin; Latex; Shellfish allergy; Eggs or egg-derived products; Ranexa; Ciprofloxacin; Codeine; Contrast media; Gelnique; and Penicillins    Social History:  The patient  reports that she has never smoked. She has never used smokeless tobacco. She reports that she does not drink alcohol or use illicit drugs.   Family History:  The patient's family history includes Cancer in her mother; Heart disease in her brother, father, and sister.    ROS:  Please see the history of present illness. Otherwise, review of systems are positive for none.   All other systems are reviewed and negative.    PHYSICAL EXAM: VS:  BP 110/60 mmHg  Pulse 67  Ht 5' 5.5" (1.664 m)  Wt 212 lb 12.8 oz (96.525 kg)  BMI 34.86 kg/m2 , BMI Body mass index is 34.86 kg/(m^2). GEN: Well nourished, well developed, in no acute distress HEENT: normal Neck: no JVD, carotid bruits, or masses Cardiac: RRR; no murmurs, rubs, or gallops,no edema  Respiratory:  clear to auscultation bilaterally, normal work of breathing GI: soft, nontender, nondistended, + BS MS: no deformity or atrophy Skin: warm and dry, no rash Neuro:  Strength and sensation are intact Psych: euthymic mood, full affect   EKG:  EKG is ordered today. The ekg ordered today demonstrates NSR HR 67   Recent Labs: 07/04/2014: Pro B Natriuretic peptide (BNP) 872.5* 08/25/2014: TSH 0.689 09/05/2014: ALT 20; Hemoglobin 14.5; Platelets 226 01/14/2015: BUN 31*; Creatinine 1.25*; Potassium 3.1*; Sodium 140    Lipid Panel    Component Value Date/Time   CHOL 162 04/18/2013 0826   TRIG 140 04/18/2013 0826   HDL 42 04/18/2013 0826   CHOLHDL 3.9 04/18/2013 0826   VLDL 28 04/18/2013 0826   LDLCALC 92 04/18/2013 0826      Wt Readings from Last 3 Encounters:  01/14/15 212 lb 12.8 oz (96.525 kg)  11/19/14 208 lb 14.4 oz (94.756 kg)  09/30/14 217 lb (98.431 kg)      Other studies  Reviewed: Additional studies/ records that were reviewed today include: 2D ECHO, LHC Review of the above records demonstrates:  -- 2D ECHO 06/2014: EF 65-75%, mild MR. -- LHC 2013 showed that the overall territory of potential ischemia was small and attributable to a moderately diseased small second diagonal artery. Med rx.  ASSESSMENT AND PLAN:  Jill Preston is a 81 y.o. female with a history of CAD s/p PCI/BMS to pLAD and POBA to mLAD, HTN, HLD, chronic diastolic HF, chronic venous insufficiency due to bilateral common femoral vein reflux, CKD, PAD, and left subclavian artery stenosis who presents to clinic today for evaluation of CP and SOB.  CP and SOB- this is a reoccurring problem for her. I spoke with her primary cardiologist, Dr. Sallyanne Kuster, who felt that [facility] was not unreasonable. Her last creat was 2.10 in 08/2014. We will check a BMET today. If okay she will need to be set up for [facility] next week. She will need to hold her xarelto the night prior and as well as her Losartan and lasix.   CAD- with worsening angina and SOB -- Continue ASA and statin. No BB due to low BP. Continue imdur 60mg . Will set her up for [facility] depending on kidney function.  Diastolic heart failure, chronic -- Functional status remains mediocre. Probably at/close to euvolemia. Dry weight uncertain.  -- Today she weighs ~213 lbs which is a little higher than her last office visit at 208 but she does not appear volume overloaded to me.   Dyslipidemia Continue statin.   Venous insufficiency and recent left iliofemoral DVT Continue compression stockings. Plan for IVC filter removal in May. SHe has an appointment for this next week at [place].  Left subclavian artery stenosis without evidence of subclavian steal Blood pressure should always be checked in the right upper extremity.   Current medicines are reviewed at length with the patient today.  The patient does not have concerns regarding  medicines.  The following changes have been made:  no change  Labs/ tests ordered today include:   Orders Placed This Encounter  Procedures  . Basic metabolic panel  . EKG 12-Lead     Disposition:   FU with Dr Sallyanne Kuster  after [facility]  Signed, Eileen Stanford, PA-C  01/14/2015 1:14 PM    [facility] [address], [place], [place]  [address] Phone: [phone]; Fax: [phone]

## 2015-01-14 ENCOUNTER — Ambulatory Visit (INDEPENDENT_AMBULATORY_CARE_PROVIDER_SITE_OTHER): Payer: Commercial Managed Care - HMO | Admitting: Physician Assistant

## 2015-01-14 ENCOUNTER — Encounter: Payer: Self-pay | Admitting: Physician Assistant

## 2015-01-14 ENCOUNTER — Other Ambulatory Visit: Payer: Self-pay | Admitting: *Deleted

## 2015-01-14 VITALS — BP 110/60 | HR 67 | Ht 65.5 in | Wt 212.8 lb

## 2015-01-14 DIAGNOSIS — I5032 Chronic diastolic (congestive) heart failure: Secondary | ICD-10-CM | POA: Diagnosis not present

## 2015-01-14 DIAGNOSIS — N183 Chronic kidney disease, stage 3 unspecified: Secondary | ICD-10-CM

## 2015-01-14 LAB — BASIC METABOLIC PANEL
BUN: 31 mg/dL — ABNORMAL HIGH (ref 6–23)
CO2: 33 mEq/L — ABNORMAL HIGH (ref 19–32)
Calcium: 10.6 mg/dL — ABNORMAL HIGH (ref 8.4–10.5)
Chloride: 98 mEq/L (ref 96–112)
Creatinine, Ser: 1.25 mg/dL — ABNORMAL HIGH (ref 0.40–1.20)
GFR: 43.89 mL/min — ABNORMAL LOW (ref >60.00)
Glucose, Bld: 90 mg/dL (ref 70–99)
Potassium: 3.1 mEq/L — ABNORMAL LOW (ref 3.5–5.1)
Sodium: 140 mEq/L (ref 135–145)

## 2015-01-14 NOTE — Patient Instructions (Addendum)
Medication Instructions:   Your physician recommends that you continue on your current medications as directed. Please refer to the Current Medication list given to you today.   Labwork: BMET  Testing/Procedures:   Follow-Up:   Any Other Special Instructions Will Be Listed Below (If Applicable).  WILL CONTACTED AFTER LAB RESULTS FOR FURTHER STEPS

## 2015-01-18 ENCOUNTER — Encounter: Payer: Self-pay | Admitting: *Deleted

## 2015-01-25 ENCOUNTER — Telehealth: Payer: Self-pay | Admitting: Cardiovascular Disease

## 2015-01-25 NOTE — Telephone Encounter (Signed)
Returned call to patient. She expressed concerns regarding her filter she has in for her clots in her abdominal area. She states she went to [facility] yesterday, was told her clots were gone. She states the MD at [facility] wants to take out filter on 5/16 and put in stent in stomach. She wanted to know if this was too much in a short period of time since she has a heart cath tomorrow. Advised patient it is up to her if she wants to reschedule the procedure at [facility] and that the MD will be able to let her know tomorrow after the cath if she should be OK to proceed with the procedure at [facility].   Will route to Dr. Sallyanne Kuster and Dr. Claiborne Billings (doing cath) as Juluis Rainier

## 2015-01-25 NOTE — Telephone Encounter (Signed)
Pt is scheduled for Cath tomorrow,she have some questions.

## 2015-01-26 ENCOUNTER — Encounter (HOSPITAL_COMMUNITY)
Admission: RE | Disposition: A | Discharge status: Home / Self Care | Payer: Commercial Managed Care - HMO | Source: Ambulatory Visit | Attending: Cardiovascular Disease

## 2015-01-26 ENCOUNTER — Encounter (HOSPITAL_COMMUNITY): Payer: Self-pay | Admitting: *Deleted

## 2015-01-26 ENCOUNTER — Other Ambulatory Visit: Payer: Self-pay | Admitting: *Deleted

## 2015-01-26 ENCOUNTER — Inpatient Hospital Stay (HOSPITAL_COMMUNITY)
Admission: RE | Admit: 2015-01-26 | Discharge: 2015-01-28 | DRG: 287 | Disposition: A | Discharge status: Home / Self Care | Payer: Commercial Managed Care - HMO | Source: Ambulatory Visit | Attending: Cardiovascular Disease | Admitting: Cardiovascular Disease

## 2015-01-26 DIAGNOSIS — R7309 Other abnormal glucose: Secondary | ICD-10-CM | POA: Diagnosis present

## 2015-01-26 DIAGNOSIS — I2510 Atherosclerotic heart disease of native coronary artery without angina pectoris: Secondary | ICD-10-CM

## 2015-01-26 DIAGNOSIS — Z8673 Personal history of transient ischemic attack (TIA), and cerebral infarction without residual deficits: Secondary | ICD-10-CM | POA: Diagnosis not present

## 2015-01-26 DIAGNOSIS — N183 Chronic kidney disease, stage 3 (moderate): Secondary | ICD-10-CM | POA: Diagnosis present

## 2015-01-26 DIAGNOSIS — I10 Essential (primary) hypertension: Secondary | ICD-10-CM | POA: Diagnosis present

## 2015-01-26 DIAGNOSIS — I130 Hypertensive heart and chronic kidney disease with heart failure and stage 1 through stage 4 chronic kidney disease, or unspecified chronic kidney disease: Secondary | ICD-10-CM | POA: Diagnosis present

## 2015-01-26 DIAGNOSIS — Z7982 Long term (current) use of aspirin: Secondary | ICD-10-CM | POA: Diagnosis not present

## 2015-01-26 DIAGNOSIS — Z91013 Allergy to seafood: Secondary | ICD-10-CM

## 2015-01-26 DIAGNOSIS — I872 Venous insufficiency (chronic) (peripheral): Secondary | ICD-10-CM | POA: Diagnosis present

## 2015-01-26 DIAGNOSIS — I771 Stricture of artery: Secondary | ICD-10-CM | POA: Diagnosis present

## 2015-01-26 DIAGNOSIS — Z79899 Other long term (current) drug therapy: Secondary | ICD-10-CM

## 2015-01-26 DIAGNOSIS — Z955 Presence of coronary angioplasty implant and graft: Secondary | ICD-10-CM | POA: Diagnosis not present

## 2015-01-26 DIAGNOSIS — E039 Hypothyroidism, unspecified: Secondary | ICD-10-CM | POA: Diagnosis present

## 2015-01-26 DIAGNOSIS — E785 Hyperlipidemia, unspecified: Secondary | ICD-10-CM | POA: Diagnosis present

## 2015-01-26 DIAGNOSIS — R079 Chest pain, unspecified: Secondary | ICD-10-CM | POA: Diagnosis present

## 2015-01-26 DIAGNOSIS — R001 Bradycardia, unspecified: Secondary | ICD-10-CM | POA: Diagnosis not present

## 2015-01-26 DIAGNOSIS — I739 Peripheral vascular disease, unspecified: Secondary | ICD-10-CM | POA: Diagnosis present

## 2015-01-26 DIAGNOSIS — Z86718 Personal history of other venous thrombosis and embolism: Secondary | ICD-10-CM | POA: Diagnosis not present

## 2015-01-26 DIAGNOSIS — I5032 Chronic diastolic (congestive) heart failure: Secondary | ICD-10-CM | POA: Diagnosis present

## 2015-01-26 DIAGNOSIS — R0789 Other chest pain: Secondary | ICD-10-CM | POA: Diagnosis present

## 2015-01-26 DIAGNOSIS — Z7901 Long term (current) use of anticoagulants: Secondary | ICD-10-CM

## 2015-01-26 DIAGNOSIS — I25119 Atherosclerotic heart disease of native coronary artery with unspecified angina pectoris: Secondary | ICD-10-CM | POA: Diagnosis not present

## 2015-01-26 DIAGNOSIS — Z01811 Encounter for preprocedural respiratory examination: Secondary | ICD-10-CM

## 2015-01-26 DIAGNOSIS — I495 Sick sinus syndrome: Secondary | ICD-10-CM | POA: Diagnosis not present

## 2015-01-26 DIAGNOSIS — I209 Angina pectoris, unspecified: Secondary | ICD-10-CM | POA: Insufficient documentation

## 2015-01-26 HISTORY — PX: CARDIAC CATHETERIZATION: SHX172

## 2015-01-26 LAB — BASIC METABOLIC PANEL
Anion gap: 10 (ref 5–15)
BUN: 29 mg/dL — ABNORMAL HIGH (ref 6–20)
CO2: 30 mmol/L (ref 22–32)
Calcium: 9.7 mg/dL (ref 8.9–10.3)
Chloride: 101 mmol/L (ref 101–111)
Creatinine, Ser: 1.4 mg/dL — ABNORMAL HIGH (ref 0.44–1.00)
GFR calc Af Amer: 40 mL/min — ABNORMAL LOW (ref >60)
GFR calc non Af Amer: 35 mL/min — ABNORMAL LOW (ref >60)
Glucose, Bld: 116 mg/dL — ABNORMAL HIGH (ref 70–99)
Potassium: 3.9 mmol/L (ref 3.5–5.1)
Sodium: 141 mmol/L (ref 135–145)

## 2015-01-26 LAB — MRSA PCR SCREENING: MRSA by PCR: NEGATIVE

## 2015-01-26 LAB — CBC
HCT: 43.1 % (ref 36.0–46.0)
Hemoglobin: 13.9 g/dL (ref 12.0–15.0)
MCH: 30.9 pg (ref 26.0–34.0)
MCHC: 32.3 g/dL (ref 30.0–36.0)
MCV: 95.8 fL (ref 78.0–100.0)
Platelets: 174 10*3/uL (ref 150–400)
RBC: 4.5 MIL/uL (ref 3.87–5.11)
RDW: 13 % (ref 11.5–15.5)
WBC: 5.5 10*3/uL (ref 4.0–10.5)

## 2015-01-26 LAB — TROPONIN I: Troponin I: <0.03 ng/mL (ref <0.031)

## 2015-01-26 SURGERY — RIGHT/LEFT HEART CATH AND CORONARY ANGIOGRAPHY

## 2015-01-26 MED ORDER — FUROSEMIDE 40 MG PO TABS
40.0000 mg | ORAL_TABLET | Freq: Every day | ORAL | Status: DC
  Administered 2015-01-27 – 2015-01-28 (×2): 40 mg via ORAL
  Filled 2015-01-26 (×2): qty 1

## 2015-01-26 MED ORDER — LOSARTAN POTASSIUM 50 MG PO TABS
100.0000 mg | ORAL_TABLET | Freq: Every day | ORAL | Status: DC
  Administered 2015-01-26 – 2015-01-28 (×3): 100 mg via ORAL
  Filled 2015-01-26 (×3): qty 2

## 2015-01-26 MED ORDER — METHYLPREDNISOLONE SODIUM SUCC 125 MG IJ SOLR
INTRAMUSCULAR | Status: AC
  Filled 2015-01-26: qty 2

## 2015-01-26 MED ORDER — LEVOTHYROXINE SODIUM 125 MCG PO TABS
125.0000 ug | ORAL_TABLET | Freq: Every day | ORAL | Status: DC
  Administered 2015-01-27 – 2015-01-28 (×2): 125 ug via ORAL
  Filled 2015-01-26 (×3): qty 1

## 2015-01-26 MED ORDER — FENTANYL CITRATE (PF) 100 MCG/2ML IJ SOLN
INTRAMUSCULAR | Status: DC | PRN
  Administered 2015-01-26: 25 ug via INTRAVENOUS

## 2015-01-26 MED ORDER — ISOSORBIDE MONONITRATE ER 60 MG PO TB24
60.0000 mg | ORAL_TABLET | Freq: Every day | ORAL | Status: DC
  Administered 2015-01-27 – 2015-01-28 (×2): 60 mg via ORAL
  Filled 2015-01-26 (×2): qty 1

## 2015-01-26 MED ORDER — SODIUM CHLORIDE 0.9 % IJ SOLN: 3.0000 mL | INTRAMUSCULAR | Status: DC | PRN

## 2015-01-26 MED ORDER — ASPIRIN EC 81 MG PO TBEC: 81.0000 mg | DELAYED_RELEASE_TABLET | Freq: Every day | ORAL | Status: DC

## 2015-01-26 MED ORDER — FAMOTIDINE IN NACL 20-0.9 MG/50ML-% IV SOLN
20.0000 mg | Freq: Once | INTRAVENOUS | Status: AC
  Administered 2015-01-26: 20 mg via INTRAVENOUS

## 2015-01-26 MED ORDER — ACETAMINOPHEN ER 650 MG PO TBCR: 1300.0000 mg | EXTENDED_RELEASE_TABLET | Freq: Three times a day (TID) | ORAL | Status: DC | PRN

## 2015-01-26 MED ORDER — ACETAMINOPHEN 325 MG PO TABS: 650.0000 mg | ORAL_TABLET | ORAL | Status: DC | PRN

## 2015-01-26 MED ORDER — ASPIRIN EC 81 MG PO TBEC
81.0000 mg | DELAYED_RELEASE_TABLET | Freq: Every day | ORAL | Status: DC
  Administered 2015-01-27 – 2015-01-28 (×2): 81 mg via ORAL
  Filled 2015-01-26 (×2): qty 1

## 2015-01-26 MED ORDER — HEPARIN (PORCINE) IN NACL 100-0.45 UNIT/ML-% IJ SOLN
1200.0000 [IU]/h | INTRAMUSCULAR | Status: DC
  Administered 2015-01-26: 1000 [IU]/h via INTRAVENOUS
  Filled 2015-01-26 (×2): qty 250

## 2015-01-26 MED ORDER — LOSARTAN POTASSIUM-HCTZ 100-25 MG PO TABS: 1.0000 | ORAL_TABLET | Freq: Every day | ORAL | Status: DC

## 2015-01-26 MED ORDER — SODIUM CHLORIDE 0.9 % IV SOLN: 250.0000 mL | INTRAVENOUS | Status: DC | PRN

## 2015-01-26 MED ORDER — LIDOCAINE HCL (PF) 1 % IJ SOLN
INTRAMUSCULAR | Status: AC
  Filled 2015-01-26: qty 30

## 2015-01-26 MED ORDER — TRAMADOL HCL 50 MG PO TABS
100.0000 mg | ORAL_TABLET | Freq: Once | ORAL | Status: AC
  Administered 2015-01-26: 100 mg via ORAL
  Filled 2015-01-26: qty 2

## 2015-01-26 MED ORDER — SODIUM CHLORIDE 0.9 % WEIGHT BASED INFUSION
3.0000 mL/kg/h | INTRAVENOUS | Status: DC
  Administered 2015-01-26: 3 mL/kg/h via INTRAVENOUS

## 2015-01-26 MED ORDER — FENOFIBRATE 160 MG PO TABS
160.0000 mg | ORAL_TABLET | Freq: Every day | ORAL | Status: DC
  Administered 2015-01-27 – 2015-01-28 (×2): 160 mg via ORAL
  Filled 2015-01-26 (×2): qty 1

## 2015-01-26 MED ORDER — DIPHENHYDRAMINE HCL 50 MG/ML IJ SOLN
INTRAMUSCULAR | Status: AC
  Filled 2015-01-26: qty 1

## 2015-01-26 MED ORDER — SODIUM CHLORIDE 0.9 % IV SOLN: INTRAVENOUS | Status: AC

## 2015-01-26 MED ORDER — ADULT MULTIVITAMIN W/MINERALS CH
1.0000 | ORAL_TABLET | Freq: Every day | ORAL | Status: DC
  Administered 2015-01-26 – 2015-01-28 (×3): 1 via ORAL
  Filled 2015-01-26 (×3): qty 1

## 2015-01-26 MED ORDER — MIDAZOLAM HCL 2 MG/2ML IJ SOLN
INTRAMUSCULAR | Status: DC | PRN
  Administered 2015-01-26: 1 mg via INTRAVENOUS

## 2015-01-26 MED ORDER — IOHEXOL 350 MG/ML SOLN
INTRAVENOUS | Status: DC | PRN
  Administered 2015-01-26: 100 mL via INTRA_ARTERIAL

## 2015-01-26 MED ORDER — DIPHENHYDRAMINE HCL 50 MG/ML IJ SOLN
25.0000 mg | Freq: Once | INTRAMUSCULAR | Status: AC
  Administered 2015-01-26: 25 mg via INTRAVENOUS

## 2015-01-26 MED ORDER — SODIUM CHLORIDE 0.9 % WEIGHT BASED INFUSION
1.0000 mL/kg/h | INTRAVENOUS | Status: DC
  Administered 2015-01-26: 1 mL/kg/h via INTRAVENOUS

## 2015-01-26 MED ORDER — ONDANSETRON HCL 4 MG/2ML IJ SOLN: 4.0000 mg | Freq: Four times a day (QID) | INTRAMUSCULAR | Status: DC | PRN

## 2015-01-26 MED ORDER — DOCUSATE SODIUM 100 MG PO CAPS
100.0000 mg | ORAL_CAPSULE | Freq: Every day | ORAL | Status: DC
  Administered 2015-01-27 – 2015-01-28 (×2): 100 mg via ORAL
  Filled 2015-01-26 (×2): qty 1

## 2015-01-26 MED ORDER — METOPROLOL TARTRATE 12.5 MG HALF TABLET
12.5000 mg | ORAL_TABLET | Freq: Two times a day (BID) | ORAL | Status: DC
  Administered 2015-01-26: 12.5 mg via ORAL
  Filled 2015-01-26 (×3): qty 1

## 2015-01-26 MED ORDER — ASPIRIN 81 MG PO CHEW: 81.0000 mg | CHEWABLE_TABLET | ORAL | Status: DC

## 2015-01-26 MED ORDER — METHYLPREDNISOLONE SODIUM SUCC 125 MG IJ SOLR
125.0000 mg | Freq: Once | INTRAMUSCULAR | Status: AC
Start: 2015-01-26 — End: 2015-01-26
  Administered 2015-01-26: 125 mg via INTRAVENOUS

## 2015-01-26 MED ORDER — MIDAZOLAM HCL 2 MG/2ML IJ SOLN
INTRAMUSCULAR | Status: AC
  Filled 2015-01-26: qty 2

## 2015-01-26 MED ORDER — FENTANYL CITRATE (PF) 100 MCG/2ML IJ SOLN
INTRAMUSCULAR | Status: AC
  Filled 2015-01-26: qty 2

## 2015-01-26 MED ORDER — NITROGLYCERIN 0.4 MG SL SUBL: 0.4000 mg | SUBLINGUAL_TABLET | SUBLINGUAL | Status: DC | PRN

## 2015-01-26 MED ORDER — HEPARIN (PORCINE) IN NACL 2-0.9 UNIT/ML-% IJ SOLN
INTRAMUSCULAR | Status: AC
  Filled 2015-01-26: qty 1000

## 2015-01-26 MED ORDER — FAMOTIDINE IN NACL 20-0.9 MG/50ML-% IV SOLN
INTRAVENOUS | Status: AC
  Filled 2015-01-26: qty 50

## 2015-01-26 MED ORDER — ISOSORBIDE MONONITRATE ER 30 MG PO TB24: 30.0000 mg | ORAL_TABLET | Freq: Every day | ORAL | Status: DC

## 2015-01-26 MED ORDER — MELATONIN 3 MG PO CAPS: 1.0000 | ORAL_CAPSULE | Freq: Every evening | ORAL | Status: DC | PRN

## 2015-01-26 MED ORDER — NITROGLYCERIN 1 MG/10 ML FOR IR/CATH LAB
INTRA_ARTERIAL | Status: AC
  Filled 2015-01-26: qty 10

## 2015-01-26 MED ORDER — SODIUM CHLORIDE 0.9 % IJ SOLN: 3.0000 mL | Freq: Two times a day (BID) | INTRAMUSCULAR | Status: DC

## 2015-01-26 MED ORDER — HYDROCHLOROTHIAZIDE 25 MG PO TABS
25.0000 mg | ORAL_TABLET | Freq: Every day | ORAL | Status: DC
  Administered 2015-01-26 – 2015-01-28 (×3): 25 mg via ORAL
  Filled 2015-01-26 (×3): qty 1

## 2015-01-26 MED ORDER — SODIUM CHLORIDE 0.9 % IJ SOLN
3.0000 mL | Freq: Two times a day (BID) | INTRAMUSCULAR | Status: DC
  Administered 2015-01-27 (×2): 3 mL via INTRAVENOUS

## 2015-01-26 SURGICAL SUPPLY — 20 items
CATH BALLN WEDGE 5F 110CM (CATHETERS) IMPLANT
CATH INFINITI 5FR ANG PIGTAIL (CATHETERS) IMPLANT
CATH INFINITI 5FR JL5 (CATHETERS) ×3 IMPLANT
CATH INFINITI 5FR MULTPACK ANG (CATHETERS) ×3 IMPLANT
CATH OPTITORQUE TIG 4.0 5F (CATHETERS) IMPLANT
CATH SWAN GANZ 7F STRAIGHT (CATHETERS) IMPLANT
DEVICE RAD COMP TR BAND LRG (VASCULAR PRODUCTS) IMPLANT
GLIDESHEATH SLEND A-KIT 6F 22G (SHEATH) IMPLANT
KIT HEART LEFT (KITS) ×3 IMPLANT
KIT HEART RIGHT NAMIC (KITS) IMPLANT
PACK CARDIAC CATHETERIZATION (CUSTOM PROCEDURE TRAY) ×3 IMPLANT
SET AVANTA MULTI PATIENT (MISCELLANEOUS) IMPLANT
SHEATH FAST CATH BRACH 5F 5CM (SHEATH) IMPLANT
SHEATH PINNACLE 5F 10CM (SHEATH) ×3 IMPLANT
SHEATH PINNACLE 7F 10CM (SHEATH) IMPLANT
SYR MEDRAD MARK V 150ML (SYRINGE) ×3 IMPLANT
TRANSDUCER W/STOPCOCK (MISCELLANEOUS) ×3 IMPLANT
TUBING CIL FLEX 10 FLL-RA (TUBING) IMPLANT
WIRE EMERALD 3MM-J .035X150CM (WIRE) ×3 IMPLANT
WIRE SAFE-T 1.5MM-J .035X260CM (WIRE) IMPLANT

## 2015-01-26 NOTE — Progress Notes (Signed)
Report given to 2H. Dr. Servando Snare in seeing pt.

## 2015-01-26 NOTE — Interval H&P Note (Signed)
History and Physical Interval Note:  01/26/2015 Cath Lab Visit (complete for each Cath Lab visit)  Clinical Evaluation Leading to the Procedure:   ACS: No.  Non-ACS:    Anginal Classification: CCS III  Anti-ischemic medical therapy: Maximal Therapy (2 or more classes of medications)  Non-Invasive Test Results: No non-invasive testing performed  Prior CABG: No previous CABG       10:59 AM  Jill Preston  has presented today for surgery, with the diagnosis of cp/shortness of breath  The various methods of treatment have been discussed with the patient and family. After consideration of risks, benefits and other options for treatment, the patient has consented to  Procedure(s): Right/Left Heart Cath and Coronary Angiography (N/A) as a surgical intervention .  The patient's history has been reviewed, patient examined, no change in status, stable for surgery.  I have reviewed the patient's chart and labs.  Questions were answered to the patient's satisfaction.     KELLY,THOMAS A

## 2015-01-26 NOTE — Progress Notes (Signed)
Bed requested

## 2015-01-26 NOTE — H&P (View-Only) (Signed)
Cardiology Office Note   Date:  01/14/2015   ID:  Jill Preston, DOB Jan 15, 1933, MRN [id number]  PCP:  Rachell Cipro, MD  Cardiologist:  Dr. Sallyanne Kuster   CP and SOB    History of Present Illness: Jill Preston is a 81 y.o. female with a history of CAD s/p PCI/BMS to pLAD and POBA to mLAD, HTN, HLD, chronic diastolic HF, chronic venous insufficiency due to bilateral common femoral vein reflux, CKD, PAD, and left subclavian artery stenosis who presents to clinic today for evaluation of CP and SOB.  She has CAD (s/p PCI/BMS to proximal LAD and balloon angioplasty to mid LAD, moderate diag disease in 2013). She had her last cath in 09/2011 showed that the overall territory of potential ischemia was small and attributable to a moderately diseased small second diagonal artery. She had a LexiScan Myoview 02/2013 which showed a relatively small anterior wall reversible defect. This was felt to correspond quite well with a known stenosis in the second diagonal artery that was left for medical therapy after her last cath. Left ventricular systolic function is normal. She states that the discomfort she felt in her chest during a LexiScan study was identical with her clinical complaints of left-sided chest pain. Unfortunately there was little room to adjust her antianginal is due to low blood pressure. Ranexa seemed to work but was associated with intolerable constipation. Dr Sallyanne Kuster elected to avoid invasive coronary angiography at that time, especially knowing that the diagonal artery stenosis was not felt amenable to PCI.   She was admitted to [facility] from 10/18-10/22/15 with acute diastolic HF. 2D Echo 25/42/70 showed EF 65-70%, mild MR.   She was critically ill and admitted to the ICU at [facility] in 08/2014. She presented with acute iliofemoral left leg DVT. Anticoagulation was complicated by retroperitoneal hematoma (an IVC filter was placed) and hemorrhagic shock, subsequently sepsis due  to E.coli UTI, transient renal failure and CHF exacerbation. Imaging studies suggested possible cirrhosis (?NASH -related). Reevaluation for removal of the IVC filter showed residual bilateral DVT and this has been delayed for May. She is on Xarelto 15mg  BID.   Seen in the office on 11/19/14 and felt to be close to her dry weight at 208 lbs and chronic poor functional statius. She was noted to have class 2-3 exertional dyspnea at that time.   She was added onto my schedule today for evaluation of CP and SOB while running errands yesterday. She had chest pain which lasted ~1 hour. Her chest pain resolved after a nap but she continued to feel exhausted and felt SOB walking from room to room. She has been complaint with all her medications and salt and fluid restrictions.     Past Medical History  Diagnosis Date  . Chronic venous insufficiency     LEA VENOUS, 10/17/2011 - mild reflux in bilateral common femoral veins  . Hyperlipidemia   . Coronary artery disease     a. s/p PCI/BMS to prox LAD and balloon angioplasty to mLAD with suboptimal result in 2000. b. Abnl nuc 2012, cath 09/2011 - showed that the overall territory of potential ischemia was small and attributable to a moderately diseased small second diagonal artery. Med rx.  . Hypertension   . Chronic diastolic CHF (congestive heart failure)   . Hypothyroidism   . Hypertensive heart disease   . CKD (chronic kidney disease), stage III   . Obesity   . Arthritis   . Stroke  pt. states she had "light stroke" in sept. 1980    Past Surgical History  Procedure Laterality Date  . Cardiac catheterization Left 09/25/2011    Medical management  . Cardiac catheterization Left 03/25/2001    Normal LV function, LAD residual narrowing of less than 10%, normal ramus intermediate, circumflex, and RCA,   . Cardiac catheterization  09/04/1999    LAD, 3x94mm Tetra stent resulting in a reduction of the 80% stenosis to 0% residual  . Coronary stent  placement      LAD  . Bladder surgery  2010    WITH MESH   . Cholecystectomy    . Abdominal hysterectomy    . Rotator cuff repair Right   . Left heart catheterization with coronary angiogram N/A 09/25/2011    Procedure: LEFT HEART CATHETERIZATION WITH CORONARY ANGIOGRAM;  Surgeon: Sanda Klein, MD;  Location: [facility] [place] CATH [facility];  Service: Cardiovascular;  Laterality: N/A;     Current Outpatient Prescriptions  Medication Sig Dispense Refill  . acetaminophen (TYLENOL) 650 MG CR tablet Take 1,300 mg by mouth 2 (two) times daily.    Marland Kitchen aspirin EC 81 MG tablet Take 1 tablet (81 mg total) by mouth daily. 90 tablet 3  . diclofenac sodium (VOLTAREN) 1 % GEL Apply 1 application topically daily as needed (foot pain).     Jill Preston Sodium (COLACE PO) Take 2 capsules by mouth daily.    . fenofibrate 160 MG tablet Take 160 mg by mouth daily.      . furosemide (LASIX) 20 MG tablet Take 2 tablets (40 mg total) by mouth daily. (Patient taking differently: Take 20 mg by mouth daily. 1/2 in the afternoon as needed for swelling.) 60 tablet 12  . isosorbide mononitrate (IMDUR) 60 MG 24 hr tablet Take 1 tablet (60 mg total) by mouth daily. 90 tablet 2  . levothyroxine (SYNTHROID, LEVOTHROID) 125 MCG tablet Take 125 mcg by mouth daily before breakfast.    . losartan (COZAAR) 100 MG tablet Take 1 tablet (100 mg total) by mouth daily. (Patient taking differently: Take 25 mg by mouth daily. ) 30 tablet 9  . Melatonin 3 MG CAPS Take 1 capsule by mouth at bedtime as needed.    . Multiple Vitamin (MULTIVITAMIN WITH MINERALS) TABS tablet Take 1 tablet by mouth daily. Centrum 50+    . nitroGLYCERIN (NITROSTAT) 0.4 MG SL tablet Place 1 tablet (0.4 mg total) under the tongue every 5 (five) minutes as needed for chest pain. 25 tablet 2  . pravastatin (PRAVACHOL) 20 MG tablet Take 20 mg by mouth daily.    . Rivaroxaban (XARELTO) 15 MG TABS tablet Take 15 mg by mouth 2 (two) times daily with a meal.     No current  facility-administered medications for this visit.    Allergies:   Atorvastatin; Latex; Shellfish allergy; Eggs or egg-derived products; Ranexa; Ciprofloxacin; Codeine; Contrast media; Gelnique; and Penicillins    Social History:  The patient  reports that she has never smoked. She has never used smokeless tobacco. She reports that she does not drink alcohol or use illicit drugs.   Family History:  The patient's family history includes Cancer in her mother; Heart disease in her brother, father, and sister.    ROS:  Please see the history of present illness. Otherwise, review of systems are positive for none.   All other systems are reviewed and negative.    PHYSICAL EXAM: VS:  BP 110/60 mmHg  Pulse 67  Ht 5' 5.5" (1.664 m)  Wt 212 lb 12.8 oz (96.525 kg)  BMI 34.86 kg/m2 , BMI Body mass index is 34.86 kg/(m^2). GEN: Well nourished, well developed, in no acute distress HEENT: normal Neck: no JVD, carotid bruits, or masses Cardiac: RRR; no murmurs, rubs, or gallops,no edema  Respiratory:  clear to auscultation bilaterally, normal work of breathing GI: soft, nontender, nondistended, + BS MS: no deformity or atrophy Skin: warm and dry, no rash Neuro:  Strength and sensation are intact Psych: euthymic mood, full affect   EKG:  EKG is ordered today. The ekg ordered today demonstrates NSR HR 67   Recent Labs: 07/04/2014: Pro B Natriuretic peptide (BNP) 872.5* 08/25/2014: TSH 0.689 09/05/2014: ALT 20; Hemoglobin 14.5; Platelets 226 01/14/2015: BUN 31*; Creatinine 1.25*; Potassium 3.1*; Sodium 140    Lipid Panel    Component Value Date/Time   CHOL 162 04/18/2013 0826   TRIG 140 04/18/2013 0826   HDL 42 04/18/2013 0826   CHOLHDL 3.9 04/18/2013 0826   VLDL 28 04/18/2013 0826   LDLCALC 92 04/18/2013 0826      Wt Readings from Last 3 Encounters:  01/14/15 212 lb 12.8 oz (96.525 kg)  11/19/14 208 lb 14.4 oz (94.756 kg)  09/30/14 217 lb (98.431 kg)      Other studies  Reviewed: Additional studies/ records that were reviewed today include: 2D ECHO, LHC Review of the above records demonstrates:  -- 2D ECHO 06/2014: EF 65-75%, mild MR. -- LHC 2013 showed that the overall territory of potential ischemia was small and attributable to a moderately diseased small second diagonal artery. Med rx.  ASSESSMENT AND PLAN:  JOELYN LOVER is a 81 y.o. female with a history of CAD s/p PCI/BMS to pLAD and POBA to mLAD, HTN, HLD, chronic diastolic HF, chronic venous insufficiency due to bilateral common femoral vein reflux, CKD, PAD, and left subclavian artery stenosis who presents to clinic today for evaluation of CP and SOB.  CP and SOB- this is a reoccurring problem for her. I spoke with her primary cardiologist, Dr. Sallyanne Kuster, who felt that [facility] was not unreasonable. Her last creat was 2.10 in 08/2014. We will check a BMET today. If okay she will need to be set up for [facility] next week. She will need to hold her xarelto the night prior and as well as her Losartan and lasix.   CAD- with worsening angina and SOB -- Continue ASA and statin. No BB due to low BP. Continue imdur 60mg . Will set her up for [facility] depending on kidney function.  Diastolic heart failure, chronic -- Functional status remains mediocre. Probably at/close to euvolemia. Dry weight uncertain.  -- Today she weighs ~213 lbs which is a little higher than her last office visit at 208 but she does not appear volume overloaded to me.   Dyslipidemia Continue statin.   Venous insufficiency and recent left iliofemoral DVT Continue compression stockings. Plan for IVC filter removal in May. SHe has an appointment for this next week at [place].  Left subclavian artery stenosis without evidence of subclavian steal Blood pressure should always be checked in the right upper extremity.   Current medicines are reviewed at length with the patient today.  The patient does not have concerns regarding  medicines.  The following changes have been made:  no change  Labs/ tests ordered today include:   Orders Placed This Encounter  Procedures  . Basic metabolic panel  . EKG 12-Lead     Disposition:   FU with Dr Sallyanne Kuster  after [facility]  Signed, Eileen Stanford, PA-C  01/14/2015 1:14 PM    [facility] [place], [place], [place]  [address] Phone: [phone]; Fax: [phone]

## 2015-01-26 NOTE — Progress Notes (Signed)
ANTICOAGULATION CONSULT NOTE - Initial Consult  Pharmacy Consult for heparin Indication: chest pain/ACS  Allergies  Allergen Reactions  . Atorvastatin Anaphylaxis  . Latex Other (See Comments)    Causes blisters  . Shellfish Allergy Nausea And Vomiting    Severe nausea and vomiting  . Eggs Or Egg-Derived Products Other (See Comments)    Causes eye problems  . Ranexa [Ranolazine] Other (See Comments)    Constipation   . Ciprofloxacin Hives  . Codeine Nausea And Vomiting  . Contrast Media [Iodinated Diagnostic Agents] Other (See Comments)    Flushing  . Gelnique [Oxybutynin] Other (See Comments)    Causes blisters  . Penicillins Hives    Patient Measurements: Height: 5' 5.5" (166.4 cm) Weight: 213 lb (96.616 kg) IBW/kg (Calculated) : 58.15 Heparin Dosing Weight: 71 kg  Vital Signs: Temp: 98.3 F (36.8 C) (05/11 1830) Temp Source: Oral (05/11 1830) BP: 164/87 mmHg (05/11 1900) Pulse Rate: 77 (05/11 1900)  Labs:  Recent Labs  01/26/15 0817  HGB 13.9  HCT 43.1  PLT 174  CREATININE 1.40*    Estimated Creatinine Clearance: 37.9 mL/min (by C-G formula based on Cr of 1.4).   Medical History: Past Medical History  Diagnosis Date  . Chronic venous insufficiency     LEA VENOUS, 10/17/2011 - mild reflux in bilateral common femoral veins  . Hyperlipidemia   . Coronary artery disease     a. s/p PCI/BMS to prox LAD and balloon angioplasty to mLAD with suboptimal result in 2000. b. Abnl nuc 2012, cath 09/2011 - showed that the overall territory of potential ischemia was small and attributable to a moderately diseased small second diagonal artery. Med rx.  . Hypertension   . Chronic diastolic CHF (congestive heart failure)   . Hypothyroidism   . Hypertensive heart disease   . CKD (chronic kidney disease), stage III   . Obesity   . Arthritis   . Stroke     pt. states she had "light stroke" in sept. 1980    Medications:  Prescriptions prior to admission  Medication  Sig Dispense Refill Last Dose  . acetaminophen (TYLENOL) 650 MG CR tablet Take 1,300 mg by mouth every 8 (eight) hours as needed for pain.    Past Month at Unknown time  . aspirin EC 81 MG tablet Take 1 tablet (81 mg total) by mouth daily. 90 tablet 3 01/26/2015 at 0500  . docusate sodium (COLACE) 100 MG capsule Take 100 mg by mouth daily.   01/25/2015 at 2100  . fenofibrate 160 MG tablet Take 160 mg by mouth daily.     01/25/2015 at 0800  . furosemide (LASIX) 20 MG tablet Take 2 tablets (40 mg total) by mouth daily. 60 tablet 12 01/25/2015 at 0800  . isosorbide mononitrate (IMDUR) 60 MG 24 hr tablet Take 1 tablet (60 mg total) by mouth daily. 90 tablet 2 01/25/2015 at 0800  . Lactobacillus-Inulin (CULTURELLE DIGESTIVE HEALTH PO) Take 1 tablet by mouth daily.   01/25/2015 at Unknown time  . levothyroxine (SYNTHROID, LEVOTHROID) 125 MCG tablet Take 125 mcg by mouth daily before breakfast.   01/26/2015 at Unknown time  . losartan-hydrochlorothiazide (HYZAAR) 100-25 MG per tablet Take 1 tablet by mouth daily.   01/25/2015 at Unknown time  . Melatonin 3 MG CAPS Take 1 capsule by mouth at bedtime as needed (sleep).    01/25/2015 at Unknown time  . Multiple Vitamin (MULTIVITAMIN WITH MINERALS) TABS tablet Take 1 tablet by mouth daily. Centrum 50+  01/25/2015 at Unknown time  . nitroGLYCERIN (NITROSTAT) 0.4 MG SL tablet Place 1 tablet (0.4 mg total) under the tongue every 5 (five) minutes as needed for chest pain. 25 tablet 2 not yet taken  . nystatin ointment (MYCOSTATIN) Apply 1 application topically at bedtime.   0 01/25/2015 at Unknown time  . pravastatin (PRAVACHOL) 20 MG tablet Take 40 mg by mouth daily.    01/25/2015 at Unknown time  . rivaroxaban (XARELTO) 20 MG TABS tablet Take 20 mg by mouth daily with supper.   01/24/2015 at Unknown time  . losartan (COZAAR) 100 MG tablet Take 1 tablet (100 mg total) by mouth daily. (Patient not taking: Reported on 01/26/2015) 30 tablet 9 Not Taking at Unknown time     Assessment: 81 yo lady to start heparin 4 hours after sheath pull.  She is referred to [facility].   Goal of Therapy:  Heparin level 0.3-0.7 units/ml Monitor platelets by anticoagulation protocol: Yes   Plan:  Start heparin at 1000 units/hr Check heparin level in 8 hours Daily HL and CBC while on heparin Monitor for bleeding complications  Thanks for allowing pharmacy to be a part of this patient's care.  Excell Seltzer, PharmD Clinical Pharmacist, [phone]  01/26/2015,7:24 PM

## 2015-01-26 NOTE — Progress Notes (Signed)
PHARMACIST - PHYSICIAN ORDER COMMUNICATION  CONCERNING: P&T Medication Policy on Herbal Medications  DESCRIPTION:  This patient's order for:  Melatonin   has been noted.  This product(s) is classified as an "herbal" or natural product. Due to a lack of definitive safety studies or FDA approval, nonstandard manufacturing practices, plus the potential risk of unknown drug-drug interactions while on inpatient medications, the Pharmacy and Therapeutics Committee does not permit the use of "herbal" or natural products of this type within [facility].   ACTION TAKEN: The pharmacy department is unable to verify this order at this time and your patient has been informed of this safety policy. Please reevaluate patient's clinical condition at discharge and address if the herbal or natural product(s) should be resumed at that time.  Sherlon Handing, PharmD, BCPS Clinical pharmacist, pager [phone] 01/26/2015 7:22 PM

## 2015-01-26 NOTE — Progress Notes (Signed)
Lars Pinks PA in to see pt.

## 2015-01-26 NOTE — Progress Notes (Signed)
eLink Physician-Brief Progress Note Patient Name: Jill Preston DOB: Jul 02, 1933 MRN: [id number]   Date of Service  01/26/2015  HPI/Events of Note  81 yo female with PMH HTN, Hyperlipidemia, Chronic venous insufficiency, Hyothyroidism, PVD, CVA, Ileofemoral DVT s/p IVC, Chronic Diastolic Heart Failure and CAD. Underwent cardiac cath today with PCI and BMS to proximal LAD and balloon dilation of mid LAD. Also has L Main and CFx stenosis. Being evaluated for CABG. Management per Cardiology.   eICU Interventions  Watch BP. May require more aggressive BP control.      Intervention Category Evaluation Type: New Patient Evaluation  Lysle Dingwall 01/26/2015, 7:41 PM

## 2015-01-26 NOTE — Progress Notes (Signed)
Site area: rt fa sheath Site Prior to Removal:  Level   0 Pressure Applied For:  20 minutes Manual:   yes Patient Status During Pull:   stable Post Pull Site:  Level  0 Post Pull Instructions Given:  yes Post Pull Pulses Present:  yes Dressing Applied:  tegaderm Bedrest begins @ 9802 Comments:  none

## 2015-01-26 NOTE — Consult Note (Signed)
[address]       [place] [address]             [phone]        Kameshia K Joslyn Craig Medical Record #[id number] Date of Birth: 02/23/1933  Referring: Dr. Claiborne Billings Primary Care: Rachell Cipro, MD  Chief Complaint:   Chest pain and shortness of breath  History of Present Illness:     This is a 81 year old female with a history of CAD (s/p PCI/BMS to proximal LAD and balloon angioplasty to mid LAD), hypertnesion, hyperlipidemia, chronic diastolic HF, chronic venous insufficiency (due to bilateral common femoral vein reflux), chronic kidney disease, peripheral arterial disease, and left subclavian artery stenosis who presented to the cardiologist office for evaluation of chest pain and shortness of breath on 01/14/2015.   She has CAD (s/p PCI/BMS to proximal LAD and balloon angioplasty to mid LAD, moderate diag disease in 2013).  Her last cath in 09/2011 showed that the overall territory of potential ischemia was small and attributable to a moderately diseased small second diagonal artery. She had a LexiScan Myoview 02/2013 which showed a relatively small anterior wall reversible defect. This was felt to correspond quite well with a known stenosis in the second diagonal artery that was left for medical therapy after her last cath. Left ventricular systolic function is normal. She states that the discomfort she felt in her chest during a LexiScan study was identical with her clinical complaints of left-sided chest pain. Unfortunately, there was little room to adjust her antianginal is due to low blood pressure. Ranexa seemed to work but was associated with intolerable constipation. Dr Sallyanne Kuster elected to avoid invasive coronary angiography at that time, especially knowing that the diagonal artery stenosis was not felt amenable to PCI.   She was admitted to [facility] from 10/18-10/22/15 with acute diastolic heart failure. 2D Echo 07/05/14 showed LVEF 65-70%  and mild MR.   She was critically ill and admitted to the ICU at [facility] in 08/2014 after being seen in the [facility]. She presented with acute iliofemoral left leg DVT. Anticoagulation was complicated by retroperitoneal hematoma (an IVC filter was placed) and hemorrhagic shock. She also had sepsis due to E.coli UTI, transient renal failure, and CHF exacerbation. Imaging studies suggested possible cirrhosis (?NASH -related). Reevaluation for removal of the IVC filter showed residual bilateral DVT and this has been delayed for May. She is on Xarelto 15mg  BID.   She was last seen  in the cardiologist office on 11/19/14 and felt to be close to her dry weight at 208 lbs and chronic poor functional statius. She was noted to have class 2-3 exertional dyspnea at that time.   She apparently was running errands yesterday and experienced chest pain and shortness of breath. The chest pain lasted about 1 hour. Her chest pain resolved after a nap, but she continued to feel exhausted and felt short of breath walking from room to room. Per the patient, she has actually been having these symptoms for several months. What prompted her to call cardiology, was left arm pain.She apparently has been compliant with all her medications and salt and fluid restrictions.   She present to [facility] today to have a cardiac catheterization. This was done by Dr. Claiborne Billings earlier today.  Cardiac Surgery consult was requested . Currently, the patient is NOT having chest pain, shortness of breath, and is in NO distress.  Current Activity/ Functional Status:  Patient is independent with mobility/ambulation, transfers, ADL's, IADL's.   Zubrod Score: At the time of surgery this patient's most appropriate activity status/level should be described as: []     0    Normal activity, no symptoms [x]     1    Restricted in physical strenuous activity but ambulatory, able to do out light work [x]     2    Ambulatory and capable of  self care, unable to do work activities, up and about                 more than 50%  Of the time                            []     3    Only limited self care, in bed greater than 50% of waking hours []     4    Completely disabled, no self care, confined to bed or chair []     5    Moribund  Past Medical History  Diagnosis Date  . Chronic venous insufficiency     LEA VENOUS, 10/17/2011 - mild reflux in bilateral common femoral veins  . Hyperlipidemia   . Coronary artery disease     a. s/p PCI/BMS to prox LAD and balloon angioplasty to mLAD with suboptimal result in 2000. b. Abnl nuc 2012, cath 09/2011 - showed that the overall territory of potential ischemia was small and attributable to a moderately diseased small second diagonal artery. Med rx.  . Hypertension   . Chronic diastolic CHF (congestive heart failure)   . Hypothyroidism   . Hypertensive heart disease   . CKD (chronic kidney disease), stage III   . Obesity   . Arthritis   . Stroke     pt. states she had "light stroke" in sept. 1980    Past Surgical History  Procedure Laterality Date  . Cardiac catheterization Left 09/25/2011    Medical management  . Cardiac catheterization Left 03/25/2001    Normal LV function, LAD residual narrowing of less than 10%, normal ramus intermediate, circumflex, and RCA,   . Cardiac catheterization  09/04/1999    LAD, 3x70mm Tetra stent resulting in a reduction of the 80% stenosis to 0% residual  . Coronary stent placement      LAD  . Bladder surgery  2010    WITH MESH   . Cholecystectomy    . Abdominal hysterectomy    . Rotator cuff repair Right   . Left heart catheterization with coronary angiogram N/A 09/25/2011    Procedure: LEFT HEART CATHETERIZATION WITH CORONARY ANGIOGRAM;  Surgeon: Sanda Klein, MD;  Location: [facility] CATH LAB;  Service: Cardiovascular;  Laterality: N/A;  . Cardiac catheterization N/A 01/26/2015    Procedure: Right/Left Heart Cath and Coronary Angiography;  Surgeon: Troy Sine, MD;  Location: [facility] CV LAB;  Service: Cardiovascular;  Laterality: N/A;    History   Social History  . Marital Status: Married    Spouse Name: N/A  . Number of Children: 1 son  . Years of Education: N/A    Social History Main Topics  . Smoking status: Never Smoker   . Smokeless tobacco: Never Used  . Alcohol Use: No  . Drug Use: No  . Sexual Activity: Not on file    Allergies  Allergen Reactions  . Atorvastatin Anaphylaxis  . Latex Other (See Comments)    Causes blisters  . Shellfish Allergy Nausea  And Vomiting    Severe nausea and vomiting  . Eggs Or Egg-Derived Products Other (See Comments)    Causes eye problems  . Ranexa [Ranolazine] Other (See Comments)    Constipation   . Ciprofloxacin Hives  . Codeine Nausea And Vomiting  . Contrast Media [Iodinated Diagnostic Agents] Other (See Comments)    Flushing  . Gelnique [Oxybutynin] Other (See Comments)    Causes blisters  . Penicillins Hives    Current Facility-Administered Medications  Medication Dose Route Frequency Provider Last Rate Last Dose  . 0.9 %  sodium chloride infusion  250 mL Intravenous PRN Troy Sine, MD      . 0.9 %  sodium chloride infusion   Intravenous Continuous Troy Sine, MD 150 mL/hr at 01/26/15 1316    . acetaminophen (TYLENOL) tablet 650 mg  650 mg Oral Q4H PRN Troy Sine, MD      . Derrill Memo ON 01/27/2015] aspirin EC tablet 81 mg  81 mg Oral Daily Troy Sine, MD      . diphenhydrAMINE (BENADRYL) 50 MG/ML injection           . famotidine (PEPCID) 20-0.9 MG/50ML-% IVPB           . methylPREDNISolone sodium succinate (SOLU-MEDROL) 125 mg/2 mL injection           . ondansetron (ZOFRAN) injection 4 mg  4 mg Intravenous Q6H PRN Troy Sine, MD      . sodium chloride 0.9 % injection 3 mL  3 mL Intravenous Q12H Troy Sine, MD      . sodium chloride 0.9 % injection 3 mL  3 mL Intravenous PRN Troy Sine, MD        Prescriptions prior to admission  Medication  Sig Dispense Refill Last Dose  . acetaminophen (TYLENOL) 650 MG CR tablet Take 1,300 mg by mouth every 8 (eight) hours as needed for pain.    Past Month at Unknown time  . aspirin EC 81 MG tablet Take 1 tablet (81 mg total) by mouth daily. 90 tablet 3 01/26/2015 at 0500  . docusate sodium (COLACE) 100 MG capsule Take 100 mg by mouth daily.   01/25/2015 at 2100  . fenofibrate 160 MG tablet Take 160 mg by mouth daily.     01/25/2015 at 0800  . furosemide (LASIX) 20 MG tablet Take 2 tablets (40 mg total) by mouth daily. 60 tablet 12 01/25/2015 at 0800  . isosorbide mononitrate (IMDUR) 60 MG 24 hr tablet Take 1 tablet (60 mg total) by mouth daily. 90 tablet 2 01/25/2015 at 0800  . Lactobacillus-Inulin (CULTURELLE DIGESTIVE HEALTH PO) Take 1 tablet by mouth daily.   01/25/2015 at Unknown time  . levothyroxine (SYNTHROID, LEVOTHROID) 125 MCG tablet Take 125 mcg by mouth daily before breakfast.   01/26/2015 at Unknown time  . losartan-hydrochlorothiazide (HYZAAR) 100-25 MG per tablet Take 1 tablet by mouth daily.   01/25/2015 at Unknown time  . Melatonin 3 MG CAPS Take 1 capsule by mouth at bedtime as needed (sleep).    01/25/2015 at Unknown time  . Multiple Vitamin (MULTIVITAMIN WITH MINERALS) TABS tablet Take 1 tablet by mouth daily. Centrum 50+   01/25/2015 at Unknown time  . nitroGLYCERIN (NITROSTAT) 0.4 MG SL tablet Place 1 tablet (0.4 mg total) under the tongue every 5 (five) minutes as needed for chest pain. 25 tablet 2 not yet taken  . nystatin ointment (MYCOSTATIN) Apply 1 application topically at bedtime.   0  01/25/2015 at Unknown time  . pravastatin (PRAVACHOL) 20 MG tablet Take 40 mg by mouth daily.    01/25/2015 at Unknown time  . rivaroxaban (XARELTO) 20 MG TABS tablet Take 20 mg by mouth daily with supper.   01/24/2015 at Unknown time  . losartan (COZAAR) 100 MG tablet Take 1 tablet (100 mg total) by mouth daily. (Patient not taking: Reported on 01/26/2015) 30 tablet 9 Not Taking at Unknown time     Family History  Problem Relation Age of Onset  . Cancer Mother   . Heart disease Father   . Heart disease Sister   . Heart disease Brother      Review of Systems:      Cardiac Review of Systems: Y or N  Chest Pain [  Y  ]  Resting SOB [ N  ] Exertional SOB  [ Y ]  Orthopnea [ N ]   Pedal Edema [  Y ]    Palpitations [ N ] Syncope  [ N ]   Presyncope [ N  ]  General Review of Systems: [Y] = yes [  ]=no Constitional: recent weight change [ N ]; anorexia Aqua.Slicker  ]; fatigue [ Y ]; nausea [  N]; night sweats [N ]; fever Aqua.Slicker  ]; or chills [  N]                                               Eye : blurred vision [ N ]; diplopia [ N  ]; vision changes-difficult to see small print [ Y ];  Amaurosis fugax[N  ]; Resp: cough [ N ];  wheezing[  N];  hemoptysis[ N ];  GI: vomiting[ N ];  dysphagia[ N ]; melena[ N ];  hematochezia [ N ]; heartburn[ N ];  GU: kidney stones [ N ]; hematuria[ N ];   dysuria [ N ];  nocturia[ N ];               Skin: rash, swelling[N  ];, hair loss[ N ];  peripheral edema[ Y ];  or itching[ N ]; Musculosketetal: myalgias[ N ];  joint swelling[N  ];  joint erythema[ N ]; joint pain[ N ];  back pain[ Y ];  Heme/Lymph: bruising[N  ];  bleeding[N  ];  anemia[ N ];  Neuro: TIA[ N ]; stroke[  Y];  vertigo[ N ];  seizures[  N];   paresthesias[ N ];  difficulty walking[ N ];  Psych:depression[ N ]; anxiety[ N ];  Endocrine: diabetes[ N ];  thyroid dysfunction[ Y ];    Physical Exam: BP 162/72 mmHg  Pulse 85  Temp(Src) 98.3 F (36.8 C) (Oral)  Resp 18  Ht 5' 5.5" (1.664 m)  Wt 213 lb (96.616 kg)  BMI 34.89 kg/m2  SpO2 94%   General appearance: alert, cooperative and no distress Head: Normocephalic, without obvious abnormality, atraumatic Neck: no adenopathy, no carotid bruit, no JVD and supple, symmetrical, trachea midline Resp: clear to auscultation bilaterally Cardio: regular rate and rhythm, S1, S2 normal, no murmur, click, rub or gallop GI: Soft, obese, non  tender, bowel sounds present Extremities: Mild LE edema;palpable DP bilaterally Neurologic: Grossly normal  Diagnostic Studies & Laboratory data:     Recent Radiology Findings:  No radiology findings   Recent Lab Findings: Lab Results  Component Value Date   WBC 5.5  01/26/2015   HGB 13.9 01/26/2015   HCT 43.1 01/26/2015   PLT 174 01/26/2015   GLUCOSE 116* 01/26/2015   CHOL 162 04/18/2013   TRIG 140 04/18/2013   HDL 42 04/18/2013   LDLCALC 92 04/18/2013   ALT 20 09/05/2014   AST 30 09/05/2014   NA 141 01/26/2015   K 3.9 01/26/2015   CL 101 01/26/2015   CREATININE 1.40* 01/26/2015   BUN 29* 01/26/2015   CO2 30 01/26/2015   TSH 0.689 08/25/2014   INR 1.08 09/05/2014    Chronic Kidney Disease   Stage I     GFR >90  Stage II    GFR 60-89  Stage IIIA GFR 45-59  Stage IIIB GFR 30-44  Stage IV   GFR 15-29  Stage V    GFR  <15  Lab Results  Component Value Date   CREATININE 1.40* 01/26/2015   Estimated Creatinine Clearance: 37.9 mL/min (by C-G formula based on Cr of 1.4).  Cath films:  LM lesion, 70% stenosed.  Ost Cx lesion, 60% stenosed.  Prox RCA-1 lesion, 20% stenosed.  Prox RCA-2 lesion, 30% stenosed.  Hyperdynamic LV function with significant left ventricular hypertrophy and an ejection fraction of at least 70%.  Coronary obstructive disease with focal calcification of the distal left main with 70% distal left main stenosis, widely patent stent in the LAD after the first diagonal vessel, 60% ostial left circumflex stenosis prior to a very high marginal, almost ramus intermediate like vessel and mild 20 and 30% narrowings in the proximal dominant RCA.  IVC filter visualized in the inferior vena cava.  Normal left ventricular end-diastolic pressure.  RECOMMENDATION:  In light of the focal distal calcified shelflike left main stenosis with ostial circumflex encroachment, surgical consultation will be obtained for consideration of CABG revascularization  surgery Dr Claiborne Billings    I have independently reviewed the above  cath films and reviewed the findings with the  patient . After review of the films I am concerned about the significance of the lesions,     Assessment / Plan:      1. CAD-Has had PCI with BMS to proximal LAD and percutaneous balloon angioplasty to mid LAD. I suggest review of the films by additional cardiologist and consider objective determination of the significance of the lesions before considering CABG in high risk patient with chronic renal disease and history caval filter and large ileofemoral DVT  And sepsis several months ago. Discussed with patient and her husband  2. Hypertension-taking Imdur 60 mg daily, Cozaar 25 mg daily,  3. Hyperlipidemia-taking Pravachol 20 mg daily and fenofibrate 160 mg daily 4. Chronic diastolic heart failure-taking lasix  20 mg daily 5. Chronic kidney disease (stage III) 6. Chronic venous insufficiency 7. Left ileofemoral DVT-taking Xarelto 15 mg bid and has IVC filter 8. Stroke 9. Hypothyroidism-on Synthroid 125 mcg daily 10. Left subclavian artery stenosis-BP should be taken in the RUE    I  spent 45 minutes counseling the patient face to face and 50% or more the  time was spent in counseling and coordination of care. The total time spent in the appointment was 60 minutes.   Grace Isaac MD      [address] [place] [address] Office [phone]   [place]

## 2015-01-27 ENCOUNTER — Inpatient Hospital Stay (HOSPITAL_COMMUNITY): Payer: Commercial Managed Care - HMO

## 2015-01-27 ENCOUNTER — Encounter (HOSPITAL_COMMUNITY): Payer: Commercial Managed Care - HMO

## 2015-01-27 ENCOUNTER — Encounter (HOSPITAL_COMMUNITY)
Admission: RE | Disposition: A | Discharge status: Home / Self Care | Payer: Commercial Managed Care - HMO | Source: Ambulatory Visit | Attending: Cardiovascular Disease

## 2015-01-27 DIAGNOSIS — I495 Sick sinus syndrome: Secondary | ICD-10-CM

## 2015-01-27 DIAGNOSIS — I2510 Atherosclerotic heart disease of native coronary artery without angina pectoris: Secondary | ICD-10-CM

## 2015-01-27 HISTORY — PX: CARDIAC CATHETERIZATION: SHX172

## 2015-01-27 LAB — HEPARIN LEVEL (UNFRACTIONATED): Heparin Unfractionated: 0.13 IU/mL — ABNORMAL LOW (ref 0.30–0.70)

## 2015-01-27 LAB — BASIC METABOLIC PANEL
Anion gap: 8 (ref 5–15)
BUN: 24 mg/dL — ABNORMAL HIGH (ref 6–20)
CO2: 29 mmol/L (ref 22–32)
Calcium: 9.5 mg/dL (ref 8.9–10.3)
Chloride: 103 mmol/L (ref 101–111)
Creatinine, Ser: 1.27 mg/dL — ABNORMAL HIGH (ref 0.44–1.00)
GFR calc Af Amer: 45 mL/min — ABNORMAL LOW (ref >60)
GFR calc non Af Amer: 39 mL/min — ABNORMAL LOW (ref >60)
Glucose, Bld: 103 mg/dL — ABNORMAL HIGH (ref 65–99)
Potassium: 3.8 mmol/L (ref 3.5–5.1)
Sodium: 140 mmol/L (ref 135–145)

## 2015-01-27 LAB — POCT I-STAT 3, ART BLOOD GAS (G3+)
Acid-Base Excess: 1 mmol/L (ref 0.0–2.0)
Bicarbonate: 26.6 mEq/L — ABNORMAL HIGH (ref 20.0–24.0)
O2 Saturation: 95 %
TCO2: 28 mmol/L (ref 0–100)
pCO2 arterial: 46.5 mmHg — ABNORMAL HIGH (ref 35.0–45.0)
pH, Arterial: 7.365 (ref 7.350–7.450)
pO2, Arterial: 79 mmHg — ABNORMAL LOW (ref 80.0–100.0)

## 2015-01-27 LAB — POCT I-STAT 3, VENOUS BLOOD GAS (G3P V)
Acid-base deficit: 4 mmol/L — ABNORMAL HIGH (ref 0.0–2.0)
Bicarbonate: 20.8 mEq/L (ref 20.0–24.0)
O2 Saturation: 71 %
TCO2: 22 mmol/L (ref 0–100)
pCO2, Ven: 37.2 mmHg — ABNORMAL LOW (ref 45.0–50.0)
pH, Ven: 7.357 — ABNORMAL HIGH (ref 7.250–7.300)
pO2, Ven: 39 mmHg (ref 30.0–45.0)

## 2015-01-27 LAB — TROPONIN I
Troponin I: <0.03 ng/mL (ref <0.031)
Troponin I: <0.03 ng/mL (ref <0.031)

## 2015-01-27 LAB — CBC
HCT: 41.7 % (ref 36.0–46.0)
Hemoglobin: 13.9 g/dL (ref 12.0–15.0)
MCH: 31.8 pg (ref 26.0–34.0)
MCHC: 33.3 g/dL (ref 30.0–36.0)
MCV: 95.4 fL (ref 78.0–100.0)
Platelets: 166 10*3/uL (ref 150–400)
RBC: 4.37 MIL/uL (ref 3.87–5.11)
RDW: 12.8 % (ref 11.5–15.5)
WBC: 9.7 10*3/uL (ref 4.0–10.5)

## 2015-01-27 LAB — POCT ACTIVATED CLOTTING TIME
Activated Clotting Time: 294 seconds
Activated Clotting Time: 239 seconds

## 2015-01-27 LAB — HEMOGLOBIN A1C
Hgb A1c MFr Bld: 6.1 % — ABNORMAL HIGH (ref 4.8–5.6)
Mean Plasma Glucose: 128 mg/dL

## 2015-01-27 SURGERY — INTRAVASCULAR PRESSURE WIRE/FFR STUDY
Anesthesia: LOCAL

## 2015-01-27 MED ORDER — SODIUM CHLORIDE 0.9 % IJ SOLN: 3.0000 mL | INTRAMUSCULAR | Status: DC | PRN

## 2015-01-27 MED ORDER — VERAPAMIL HCL 2.5 MG/ML IV SOLN
INTRAVENOUS | Status: DC | PRN
  Administered 2015-01-27: 14:00:00 via INTRA_ARTERIAL

## 2015-01-27 MED ORDER — SODIUM CHLORIDE 0.9 % IV SOLN: 250.0000 mL | INTRAVENOUS | Status: DC | PRN

## 2015-01-27 MED ORDER — DIPHENHYDRAMINE HCL 50 MG/ML IJ SOLN
25.0000 mg | INTRAMUSCULAR | Status: AC
  Administered 2015-01-27: 25 mg via INTRAVENOUS
  Filled 2015-01-27: qty 1

## 2015-01-27 MED ORDER — IOHEXOL 300 MG/ML  SOLN
INTRAMUSCULAR | Status: DC | PRN
  Administered 2015-01-27: 70 mL via INTRA_ARTERIAL

## 2015-01-27 MED ORDER — ZOLPIDEM TARTRATE 5 MG PO TABS
5.0000 mg | ORAL_TABLET | Freq: Every evening | ORAL | Status: DC | PRN
Start: 2015-01-27 — End: 2015-01-28
  Administered 2015-01-27: 5 mg via ORAL
  Filled 2015-01-27: qty 1

## 2015-01-27 MED ORDER — SODIUM CHLORIDE 0.9 % WEIGHT BASED INFUSION: 3.0000 mL/kg/h | INTRAVENOUS | Status: DC

## 2015-01-27 MED ORDER — ADENOSINE (DIAGNOSTIC) 140MCG/KG/MIN
INTRAVENOUS | Status: DC | PRN
Start: 2015-01-27 — End: 2015-01-27
  Administered 2015-01-27: 140 ug/kg/min via INTRAVENOUS

## 2015-01-27 MED ORDER — LIDOCAINE HCL (PF) 1 % IJ SOLN
INTRAMUSCULAR | Status: AC
  Filled 2015-01-27: qty 30

## 2015-01-27 MED ORDER — VERAPAMIL HCL 2.5 MG/ML IV SOLN
INTRAVENOUS | Status: AC
  Filled 2015-01-27: qty 2

## 2015-01-27 MED ORDER — SODIUM CHLORIDE 0.9 % IV SOLN: INTRAVENOUS | Status: DC

## 2015-01-27 MED ORDER — FENTANYL CITRATE (PF) 100 MCG/2ML IJ SOLN
INTRAMUSCULAR | Status: AC
  Filled 2015-01-27: qty 2

## 2015-01-27 MED ORDER — METHYLPREDNISOLONE SODIUM SUCC 125 MG IJ SOLR
125.0000 mg | INTRAMUSCULAR | Status: AC
  Administered 2015-01-27: 125 mg via INTRAVENOUS
  Filled 2015-01-27: qty 2

## 2015-01-27 MED ORDER — HEPARIN SODIUM (PORCINE) 1000 UNIT/ML IJ SOLN
INTRAMUSCULAR | Status: DC | PRN
  Administered 2015-01-27: 10000 [IU] via INTRAVENOUS

## 2015-01-27 MED ORDER — ADENOSINE 12 MG/4ML IV SOLN
16.0000 mL | INTRAVENOUS | Status: DC
  Filled 2015-01-27: qty 16

## 2015-01-27 MED ORDER — SODIUM CHLORIDE 0.9 % IJ SOLN
3.0000 mL | Freq: Two times a day (BID) | INTRAMUSCULAR | Status: DC
  Administered 2015-01-27: 3 mL via INTRAVENOUS

## 2015-01-27 MED ORDER — SODIUM CHLORIDE 0.9 % WEIGHT BASED INFUSION: 1.0000 mL/kg/h | INTRAVENOUS | Status: DC

## 2015-01-27 MED ORDER — FENTANYL CITRATE (PF) 100 MCG/2ML IJ SOLN
INTRAMUSCULAR | Status: DC | PRN
  Administered 2015-01-27: 25 ug via INTRAVENOUS

## 2015-01-27 MED ORDER — MIDAZOLAM HCL 2 MG/2ML IJ SOLN
INTRAMUSCULAR | Status: AC
  Filled 2015-01-27: qty 2

## 2015-01-27 MED ORDER — HEPARIN (PORCINE) IN NACL 2-0.9 UNIT/ML-% IJ SOLN
INTRAMUSCULAR | Status: AC
  Filled 2015-01-27: qty 1000

## 2015-01-27 MED ORDER — MIDAZOLAM HCL 2 MG/2ML IJ SOLN
INTRAMUSCULAR | Status: DC | PRN
  Administered 2015-01-27: 1 mg via INTRAVENOUS

## 2015-01-27 MED ORDER — SODIUM CHLORIDE 0.9 % IJ SOLN
3.0000 mL | Freq: Two times a day (BID) | INTRAMUSCULAR | Status: DC
Start: 2015-01-27 — End: 2015-01-27
  Administered 2015-01-27: 3 mL via INTRAVENOUS

## 2015-01-27 MED ORDER — FAMOTIDINE IN NACL 20-0.9 MG/50ML-% IV SOLN
20.0000 mg | INTRAVENOUS | Status: AC
  Administered 2015-01-27: 20 mg via INTRAVENOUS
  Filled 2015-01-27: qty 50

## 2015-01-27 MED ORDER — DOCUSATE SODIUM 100 MG PO CAPS
100.0000 mg | ORAL_CAPSULE | Freq: Once | ORAL | Status: AC
  Administered 2015-01-27: 100 mg via ORAL
  Filled 2015-01-27: qty 1

## 2015-01-27 MED ORDER — NITROGLYCERIN 1 MG/10 ML FOR IR/CATH LAB
INTRA_ARTERIAL | Status: AC
  Filled 2015-01-27: qty 10

## 2015-01-27 MED FILL — Lidocaine HCl Local Preservative Free (PF) Inj 1%: INTRAMUSCULAR | Qty: 30 | Status: AC

## 2015-01-27 MED FILL — Heparin Sodium (Porcine) 2 Unit/ML in Sodium Chloride 0.9%: INTRAMUSCULAR | Qty: 1000 | Status: AC

## 2015-01-27 SURGICAL SUPPLY — 15 items
CATH BALLN WEDGE 5F 110CM (CATHETERS) ×2 IMPLANT
CATH MICROCATH NAVVUS (MICROCATHETER) ×1 IMPLANT
CATH VISTA GUIDE 6FR JL4 (CATHETERS) ×2 IMPLANT
CATH VISTA GUIDE 6FR XBLAD3.5 (CATHETERS) ×2 IMPLANT
GLIDESHEATH SLEND SS 6F .021 (SHEATH) ×2 IMPLANT
KIT ESSENTIALS PG (KITS) ×2 IMPLANT
KIT HEART LEFT (KITS) ×2 IMPLANT
MICROCATHETER NAVVUS (MICROCATHETER) ×2
PACK CARDIAC CATHETERIZATION (CUSTOM PROCEDURE TRAY) ×2 IMPLANT
SHEATH FAST CATH BRACH 5F 5CM (SHEATH) ×2 IMPLANT
TRANSDUCER W/STOPCOCK (MISCELLANEOUS) ×2 IMPLANT
TUBING CIL FLEX 10 FLL-RA (TUBING) ×2 IMPLANT
WIRE COUGAR XT STRL 190CM (WIRE) ×2 IMPLANT
WIRE HI TORQ WHISPER MS 190CM (WIRE) ×2 IMPLANT
WIRE SAFE-T 1.5MM-J .035X260CM (WIRE) ×2 IMPLANT

## 2015-01-27 NOTE — Progress Notes (Signed)
Subjective: Ms. Milian was seen and examined this morning.  She continues to have constant 1/10 left chest pressure.  She is not dyspneic.    Objective: Vital signs in last 24 hours: Filed Vitals:   01/27/15 0300 01/27/15 0400 01/27/15 0500 01/27/15 0600  BP: 109/39 103/41 109/37 125/48  Pulse: 41 44 43 46  Temp:   98 F (36.7 C)   TempSrc:   Oral   Resp: 12 14 19 12   Height:      Weight:      SpO2: 93% 94% 93% 89%   Weight change:   Intake/Output Summary (Last 24 hours) at 01/27/15 0727 Last data filed at 01/27/15 0600  Gross per 24 hour  Intake    332 ml  Output    850 ml  Net   -518 ml   General:  Sitting up in bed in NAD reading HEENT:  [place]/AT, no JVD or carotid bruits Cardiac: RRR, no rubs, murmurs or gallops; distal pulses 2+ Pulm: clear to auscultation bilaterally, moving normal volumes of air, no wheezes/rales/rhonchi Abd: soft, nondistended, BS present, mild diffuse tenderness (she says it's "sore") Ext: warm and well perfused, trace pedal edema, right femoral bandage c/d/i Neuro: alert and oriented X3, responding appropriately, moving extremities spontaneously  Lab Results: Basic Metabolic Panel:  Recent Labs Lab 01/26/15 0817 01/27/15 0740  NA 141 140  K 3.9 3.8  CL 101 103  CO2 30 29  GLUCOSE 116* 103*  BUN 29* 24*  CREATININE 1.40* 1.27*  CALCIUM 9.7 9.5   CBC:  Recent Labs Lab 01/26/15 0817 01/27/15 0328  WBC 5.5 9.7  HGB 13.9 13.9  HCT 43.1 41.7  MCV 95.8 95.4  PLT 174 166   Cardiac Enzymes:  Recent Labs Lab 01/26/15 2020 01/27/15 0126 01/27/15 0740  TROPONINI <0.03 <0.03 <0.03   Hemoglobin A1C:  Recent Labs Lab 01/26/15 2020  HGBA1C 6.1*    Studies/Results: Dg Chest 2 View  01/27/2015   CLINICAL DATA:  CABG.  EXAM: CHEST  2 VIEW  COMPARISON:  07/04/2014.  FINDINGS: Mediastinum and hilar structures are normal. Stable cardiomegaly with normal pulmonary vascularity. Mild bibasilar subsegmental atelectasis and/or  scarring. No pleural effusion or pneumothorax. Prior cervical spine fusion. IVC filter noted.  IMPRESSION: 1. Stable cardiomegaly.  No CHF . 2. Mild bibasilar subsegmental atelectasis.   Electronically Signed   By: Marcello Moores  Register   On: 01/27/2015 07:19   Medications: I have reviewed the patient's current medications. Scheduled Meds: . aspirin EC  81 mg Oral Daily  . docusate sodium  100 mg Oral Daily  . fenofibrate  160 mg Oral Daily  . furosemide  40 mg Oral Daily  . losartan  100 mg Oral Daily   And  . hydrochlorothiazide  25 mg Oral Daily  . isosorbide mononitrate  60 mg Oral Daily  . levothyroxine  125 mcg Oral QAC breakfast  . multivitamin with minerals  1 tablet Oral Daily  . sodium chloride  3 mL Intravenous Q12H   Continuous Infusions: . heparin 1,200 Units/hr (01/27/15 0559)   PRN Meds:.sodium chloride, acetaminophen, nitroGLYCERIN, ondansetron (ZOFRAN) IV, sodium chloride   Telemetry:  Sinus brady, HR 40s 01/27/15 EKG:  Sinus brady with sinus arrhythmia (likely respiratory), HR 40, normal intervals, no ST abnormalities  Assessment/Plan: 34 w/ PMH of CAD s/p BMS to pLAD and POBA to mLAD, HTN, HLD, chronic diastolic HF, DVT, CKD, PAD, left subclavian artery stenosis s/p LHC 01/26/15 direct admitted for consideration of CABG.  CAD:  S/p LHC 01/26/15.  Left main with 70% calcified stenosis with ostial circumflex involvement.  There was initial concern that she may need CABG, but after additional review by Cardiology and CVTS, CABG may not be necessary.  Will await RHC and Fractional Flow Reserve to assist with further management decisions.   - FFR and RHC today - continue ASA 81mg  daily, Imdur 60mg  daily, losartan 100mg  daily, fenofibrate (atorvastatin allergy - anaphylaxis; however pravastatin is on her home med list), SL NTG prn - d/c metoprolol due to significant sinus brady (HR 40s)  Sinus bradycardia:  Tele occasionally picking up AFib this morning, however, patient is  regular on exam and EKG reveals p waves.  D/c metop as above.  Chronic diastolic HF:  Euvolemic.  Continue home po Lasix dose.  Hx of left leg DVT w/ IVC filter in place:  Xarelto held for cath.  She says her last dose was on 05/08. - continue heparin gtt and continue to hold Xarelto for today's procedures   LOS: 1 day   Francesca Oman, DO 01/27/2015, 7:27 AM   Attending Note:   The patient was seen and examined.  Agree with assessment and plan as noted above.  Changes made to the above note as needed.  I have personally reviewed the angiograms.   I do not think the LM is significant .  There is a LCx stenosis that may be causing her symptoms. Agree with plans for FFR evaluation Also, she was scheduled for right heart cath which was not done.  Will do RHC today from the brachial or IJ.   She is on heparin for her DVT.   Thayer Headings, Brooke Bonito., MD, [facility] 01/27/2015, 9:41 AM [address]  [place] Pager [phone]

## 2015-01-27 NOTE — Progress Notes (Signed)
Pt c/o "burning" right lower abdomen.On first check skin was clear. On second report red rash noted approx 6 x1 in Dr  Shon Hough notified. He recomends observation for now.

## 2015-01-27 NOTE — Care Management Note (Signed)
Case Management Note  Patient Details  Name: Jill Preston MRN: [id number] Date of Birth: August 25, 1933  Subjective/Objective:    Adm w pos card cath, await cvts eval                Action/Plan:lives w husband, pcp dr Benjamine Mola dewey   Expected Discharge Date:                  Expected Discharge Plan:  Home/Self Care  In-House Referral:     Discharge planning Services     Post Acute Care Choice:    Choice offered to:     DME Arranged:    DME Agency:     HH Arranged:    [place] Agency:     Status of Service:     Medicare Important Message Given:    Date Medicare IM Given:    Medicare IM give by:    Date Additional Medicare IM Given:    Additional Medicare Important Message give by:     If discussed at [place] of Stay Meetings, dates discussed:    Additional Comments:ur ins review  Lacretia Leigh, RN 01/27/2015, 11:32 AM

## 2015-01-27 NOTE — Progress Notes (Signed)
Site area:Left ac venous sheath  Site Prior to Removal:  Level 0 Pressure Applied For:22min Manual:  yes  Patient Status During Pull:stable  Post Pull Site:  Level0 Post Pull Instructions Given:  yes Post Pull Pulses Present: n/a Dressing Applied: clear/coban Bedrest begins @  Comments: not able to obtain adequate return for ACT

## 2015-01-27 NOTE — H&P (View-Only) (Signed)
Subjective: [name] was seen and examined this morning.  She continues to have constant 1/10 left chest pressure.  She is not dyspneic.    Objective: Vital signs in last 24 hours: Filed Vitals:   01/27/15 0300 01/27/15 0400 01/27/15 0500 01/27/15 0600  BP: 109/39 103/41 109/37 125/48  Pulse: 41 44 43 46  Temp:   98 F (36.7 C)   TempSrc:   Oral   Resp: 12 14 19 12   Height:      Weight:      SpO2: 93% 94% 93% 89%   Weight change:   Intake/Output Summary (Last 24 hours) at 01/27/15 0727 Last data filed at 01/27/15 0600  Gross per 24 hour  Intake    332 ml  Output    850 ml  Net   -518 ml   General:  Sitting up in bed in NAD reading HEENT:  [place]/AT, no JVD or carotid bruits Cardiac: RRR, no rubs, murmurs or gallops; distal pulses 2+ Pulm: clear to auscultation bilaterally, moving normal volumes of air, no wheezes/rales/rhonchi Abd: soft, nondistended, BS present, mild diffuse tenderness (she says it's "sore") Ext: warm and well perfused, trace pedal edema, right femoral bandage c/d/i Neuro: alert and oriented X3, responding appropriately, moving extremities spontaneously  Lab Results: Basic Metabolic Panel:  Recent Labs Lab 01/26/15 0817 01/27/15 0740  NA 141 140  K 3.9 3.8  CL 101 103  CO2 30 29  GLUCOSE 116* 103*  BUN 29* 24*  CREATININE 1.40* 1.27*  CALCIUM 9.7 9.5   CBC:  Recent Labs Lab 01/26/15 0817 01/27/15 0328  WBC 5.5 9.7  HGB 13.9 13.9  HCT 43.1 41.7  MCV 95.8 95.4  PLT 174 166   Cardiac Enzymes:  Recent Labs Lab 01/26/15 2020 01/27/15 0126 01/27/15 0740  TROPONINI <0.03 <0.03 <0.03   Hemoglobin A1C:  Recent Labs Lab 01/26/15 2020  HGBA1C 6.1*    Studies/Results: Dg Chest 2 View  01/27/2015   CLINICAL DATA:  CABG.  EXAM: CHEST  2 VIEW  COMPARISON:  07/04/2014.  FINDINGS: Mediastinum and hilar structures are normal. Stable cardiomegaly with normal pulmonary vascularity. Mild bibasilar subsegmental atelectasis and/or  scarring. No pleural effusion or pneumothorax. Prior cervical spine fusion. IVC filter noted.  IMPRESSION: 1. Stable cardiomegaly.  No CHF . 2. Mild bibasilar subsegmental atelectasis.   Electronically Signed   By: Marcello Moores  Register   On: 01/27/2015 07:19   Medications: I have reviewed the patient's current medications. Scheduled Meds: . aspirin EC  81 mg Oral Daily  . docusate sodium  100 mg Oral Daily  . fenofibrate  160 mg Oral Daily  . furosemide  40 mg Oral Daily  . losartan  100 mg Oral Daily   And  . hydrochlorothiazide  25 mg Oral Daily  . isosorbide mononitrate  60 mg Oral Daily  . levothyroxine  125 mcg Oral QAC breakfast  . multivitamin with minerals  1 tablet Oral Daily  . sodium chloride  3 mL Intravenous Q12H   Continuous Infusions: . heparin 1,200 Units/hr (01/27/15 0559)   PRN Meds:.sodium chloride, acetaminophen, nitroGLYCERIN, ondansetron (ZOFRAN) IV, sodium chloride   Telemetry:  Sinus brady, HR 40s 01/27/15 EKG:  Sinus brady with sinus arrhythmia (likely respiratory), HR 40, normal intervals, no ST abnormalities  Assessment/Plan: 76 w/ PMH of CAD s/p BMS to pLAD and POBA to mLAD, HTN, HLD, chronic diastolic HF, DVT, CKD, PAD, left subclavian artery stenosis s/p LHC 01/26/15 direct admitted for consideration of CABG.  CAD:  S/p LHC 01/26/15.  Left main with 70% calcified stenosis with ostial circumflex involvement.  There was initial concern that she may need CABG, but after additional review by Cardiology and CVTS, CABG may not be necessary.  Will await RHC and Fractional Flow Reserve to assist with further management decisions.   - FFR and RHC today - continue ASA 81mg  daily, Imdur 60mg  daily, losartan 100mg  daily, fenofibrate (atorvastatin allergy - anaphylaxis; however pravastatin is on her home med list), SL NTG prn - d/c metoprolol due to significant sinus brady (HR 40s)  Sinus bradycardia:  Tele occasionally picking up AFib this morning, however, patient is  regular on exam and EKG reveals p waves.  D/c metop as above.  Chronic diastolic HF:  Euvolemic.  Continue home po Lasix dose.  Hx of left leg DVT w/ IVC filter in place:  Xarelto held for cath.  She says her last dose was on 05/08. - continue heparin gtt and continue to hold Xarelto for today's procedures   LOS: 1 day   Francesca Oman, DO 01/27/2015, 7:27 AM   Attending Note:   The patient was seen and examined.  Agree with assessment and plan as noted above.  Changes made to the above note as needed.  I have personally reviewed the angiograms.   I do not think the LM is significant .  There is a LCx stenosis that may be causing her symptoms. Agree with plans for FFR evaluation Also, she was scheduled for right heart cath which was not done.  Will do RHC today from the brachial or IJ.   She is on heparin for her DVT.   Thayer Headings, Brooke Bonito., MD, [facility] 01/27/2015, 9:41 AM [address],  [place] Pager [phone]

## 2015-01-27 NOTE — Progress Notes (Signed)
VASCULAR LAB PRELIMINARY  PRELIMINARY  PRELIMINARY  PRELIMINARY  Pre-op Cardiac Surgery  Carotid Findings at [facility] Cardiovascular Imaging at [facility] on 08/04/14:  Bilateral:  1-39% ICA stenosis.  Vertebral artery flow is antegrade.      Upper Extremity Right Left  Brachial Pressures 145 triphasic Triphasic - BP not taken due to left subclavian steal and cath via left radial artery  Radial Waveforms triphasic triphasic  Ulnar Waveforms triphasic triphasic  Palmar Arch (Allen's Test) WNL* **   Findings:  *Right:  Doppler waveforms remain normal with ulnar and radial compressions.  **Left:  Allen's test not performed due to cath via left radial artery.    Lower  Extremity Right Left  Dorsalis Pedis    Anterior Tibial 172 triphasic 168 triphasic  Posterior Tibial 179 triphasic 176 triphasic  Ankle/Brachial Indices      Findings:  ABI is within normal limits bilaterally.   Guinevere Ferrari, RVT 01/27/2015, 4:55 PM

## 2015-01-27 NOTE — Interval H&P Note (Signed)
History and Physical Interval Note:  01/27/2015 1:17 PM  Jill Preston  has presented today for cardiac cath with the diagnosis of CAD, chest pain. The various methods of treatment have been discussed with the patient and family. After consideration of risks, benefits and other options for treatment, the patient has consented to  Procedure(s): Intravascular Pressure Wire/FFR Study (N/A) Right Heart Cath (N/A) as a surgical intervention .  The patient's history has been reviewed, patient examined, no change in status, stable for surgery.  I have reviewed the patient's chart and labs.  Questions were answered to the patient's satisfaction.    Cath Lab Visit (complete for each Cath Lab visit)  Clinical Evaluation Leading to the Procedure:   ACS: No.  Non-ACS:    Anginal Classification: CCS III  Anti-ischemic medical therapy: Minimal Therapy (1 class of medications)  Non-Invasive Test Results: No non-invasive testing performed  Prior CABG: No previous CABG         MCALHANY,CHRISTOPHER

## 2015-01-27 NOTE — Progress Notes (Signed)
ANTICOAGULATION CONSULT NOTE - Follow Up Consult  Pharmacy Consult for Heparin  Indication: chest pain/ACS, s/p cath, undergoing TCTS work-up  Allergies  Allergen Reactions  . Atorvastatin Anaphylaxis  . Latex Other (See Comments)    Causes blisters  . Shellfish Allergy Nausea And Vomiting    Severe nausea and vomiting  . Eggs Or Egg-Derived Products Other (See Comments)    Causes eye problems  . Ranexa [Ranolazine] Other (See Comments)    Constipation   . Ciprofloxacin Hives  . Codeine Nausea And Vomiting  . Contrast Media [Iodinated Diagnostic Agents] Other (See Comments)    Flushing  . Gelnique [Oxybutynin] Other (See Comments)    Causes blisters  . Penicillins Hives    Patient Measurements: Height: 5' 5.5" (166.4 cm) Weight: 213 lb (96.616 kg) IBW/kg (Calculated) : 58.15  Vital Signs: Temp: 98 F (36.7 C) (05/12 0000) Temp Source: Oral (05/12 0000) BP: 109/39 mmHg (05/12 0300) Pulse Rate: 41 (05/12 0300)  Labs:  Recent Labs  01/26/15 0817 01/26/15 2020 01/27/15 0126 01/27/15 0328  HGB 13.9  --   --  13.9  HCT 43.1  --   --  41.7  PLT 174  --   --  166  HEPARINUNFRC  --   --   --  0.13*  CREATININE 1.40*  --   --   --   TROPONINI  --  <0.03 <0.03  --     Estimated Creatinine Clearance: 37.9 mL/min (by C-G formula based on Cr of 1.4).  Assessment: Sub-therapeutic heparin level x 1 s/p cath, no issues per RN.   Goal of Therapy:  Heparin level 0.3-0.7 units/ml Monitor platelets by anticoagulation protocol: Yes   Plan:  -Increase heparin drip to 1200 units/hr -1400 HL -Daily CBC/HL -Monitor for bleeding  Narda Bonds 01/27/2015,5:56 AM

## 2015-01-28 ENCOUNTER — Encounter (HOSPITAL_COMMUNITY): Payer: Self-pay | Admitting: Cardiovascular Disease

## 2015-01-28 DIAGNOSIS — I10 Essential (primary) hypertension: Secondary | ICD-10-CM

## 2015-01-28 LAB — CBC
HCT: 41.2 % (ref 36.0–46.0)
Hemoglobin: 13.4 g/dL (ref 12.0–15.0)
MCH: 30.9 pg (ref 26.0–34.0)
MCHC: 32.5 g/dL (ref 30.0–36.0)
MCV: 94.9 fL (ref 78.0–100.0)
Platelets: 153 10*3/uL (ref 150–400)
RBC: 4.34 MIL/uL (ref 3.87–5.11)
RDW: 12.8 % (ref 11.5–15.5)
WBC: 7.2 10*3/uL (ref 4.0–10.5)

## 2015-01-28 LAB — BASIC METABOLIC PANEL
Anion gap: 13 (ref 5–15)
BUN: 27 mg/dL — ABNORMAL HIGH (ref 6–20)
CO2: 25 mmol/L (ref 22–32)
Calcium: 9.3 mg/dL (ref 8.9–10.3)
Chloride: 99 mmol/L — ABNORMAL LOW (ref 101–111)
Creatinine, Ser: 1.33 mg/dL — ABNORMAL HIGH (ref 0.44–1.00)
GFR calc Af Amer: 43 mL/min — ABNORMAL LOW (ref >60)
GFR calc non Af Amer: 37 mL/min — ABNORMAL LOW (ref >60)
Glucose, Bld: 126 mg/dL — ABNORMAL HIGH (ref 65–99)
Potassium: 3.9 mmol/L (ref 3.5–5.1)
Sodium: 137 mmol/L (ref 135–145)

## 2015-01-28 MED ORDER — RIVAROXABAN 20 MG PO TABS
20.0000 mg | ORAL_TABLET | Freq: Every day | ORAL | Status: DC
  Filled 2015-01-28: qty 1

## 2015-01-28 MED FILL — Lidocaine HCl Local Preservative Free (PF) Inj 1%: INTRAMUSCULAR | Qty: 30 | Status: AC

## 2015-01-28 MED FILL — Heparin Sodium (Porcine) 2 Unit/ML in Sodium Chloride 0.9%: INTRAMUSCULAR | Qty: 1000 | Status: AC

## 2015-01-28 NOTE — Care Management Note (Signed)
Case Management Note  Patient Details  Name: BUSHRA DENMAN MRN: [id number] Date of Birth: 01-22-1933  Subjective/Objective:       Adm w cad             Action/Plan:lives w husband   Expected Discharge Date:     01/28/15             Expected Discharge Plan:  Home/Self Care  In-House Referral:     Discharge planning Services  CM Consult, Medication Assistance  Post Acute Care Choice:    Choice offered to:     DME Arranged:    DME Agency:     HH Arranged:    HH Agency:     Status of Service:     Medicare Important Message Given:    Date Medicare IM Given:    Medicare IM give by:    Date Additional Medicare IM Given:    Additional Medicare Important Message give by:     If discussed at [place] of Stay Meetings, dates discussed:    Additional Comments:spoke w pt and husband, gave pt 30day free xarelto card. She had never used this. Gave her pt asssist inform on xarelto since her copay is 400.00 per month. Gave her inform on shipp program thru [place] to see if she can get assist w meds.  Lacretia Leigh, RN 01/28/2015, 9:39 AM

## 2015-01-28 NOTE — Discharge Instructions (Addendum)
Angiogram, Care After Refer to this sheet in the next few weeks. These instructions provide you with information on caring for yourself after your procedure. Your health care provider may also give you more specific instructions. Your treatment has been planned according to current medical practices, but problems sometimes occur. Call your health care provider if you have any problems or questions after your procedure.  WHAT TO EXPECT AFTER THE PROCEDURE After your procedure, it is typical to have the following sensations:  Minor discomfort or tenderness and a small bump at the catheter insertion site. The bump should usually decrease in size and tenderness within 1 to 2 weeks.  Any bruising will usually fade within 2 to 4 weeks. HOME CARE INSTRUCTIONS   You may need to keep taking blood thinners if they were prescribed for you. Take medicines only as directed by your health care provider.  Do not apply powder or lotion to the site.  Do not take baths, swim, or use a hot tub until your health care provider approves.  You may shower 24 hours after the procedure. Remove the bandage (dressing) and gently wash the site with plain soap and water. Gently pat the site dry.  Inspect the site at least twice daily.  Limit your activity for the first 48 hours. Do not bend, squat, or lift anything over 20 lb (9 kg) or as directed by your health care provider.  Plan to have someone take you home after the procedure. Follow instructions about when you can drive or return to work. SEEK MEDICAL CARE IF:  You get light-headed when standing up.  You have drainage (other than a small amount of blood on the dressing).  You have chills.  You have a fever.  You have redness, warmth, swelling, or pain at the insertion site. SEEK IMMEDIATE MEDICAL CARE IF:   You develop chest pain or shortness of breath, feel faint, or pass out.  You have bleeding, swelling larger than a walnut, or drainage from the  catheter insertion site.  You develop pain, discoloration, coldness, or severe bruising in the leg or arm that held the catheter.  You develop bleeding from any other place, such as the bowels. You may see bright red blood in your urine or stools, or your stools may appear black and tarry.  You have heavy bleeding from the site. If this happens, hold pressure on the site and call 911. MAKE SURE YOU:  Understand these instructions.  Will watch your condition.  Will get help right away if you are not doing well or get worse. Document Released: 03/22/2005 Document Revised: 01/18/2014 Document Reviewed: 01/26/2013 [facility] Patient Information 2015 [place], [place]. This information is not intended to replace advice given to you by your health care provider. Make sure you discuss any questions you have with your health care provider.  ------------------------  Information on my medicine - XARELTO (rivaroxaban)  This medication education was reviewed with me or my healthcare representative as part of my discharge preparation.  The pharmacist that spoke with me during my hospital stay was:  Blossom Hoops, Datto? Xarelto was prescribed to treat blood clots that may have been found in the veins of your legs (deep vein thrombosis) or in your lungs (pulmonary embolism) and to reduce the risk of them occurring again.  What do you need to know about Xarelto? DO NOT stop taking Xarelto without talking to the health care provider who prescribed the medication.  Refill your prescription for 20 mg tablets before you run out.  After discharge, you should have regular check-up appointments with your healthcare provider that is prescribing your Xarelto.  In the future your dose may need to be changed if your kidney function changes by a significant amount.  What do you do if you miss a dose? If you are taking Xarelto TWICE DAILY and you miss a dose, take it as soon  as you remember. You may take two 15 mg tablets (total 30 mg) at the same time then resume your regularly scheduled 15 mg twice daily the next day.  If you are taking Xarelto ONCE DAILY and you miss a dose, take it as soon as you remember on the same day then continue your regularly scheduled once daily regimen the next day. Do not take two doses of Xarelto at the same time.   Important Safety Information Xarelto is a blood thinner medicine that can cause bleeding. You should call your healthcare provider right away if you experience any of the following: ? Bleeding from an injury or your nose that does not stop. ? Unusual colored urine (red or dark brown) or unusual colored stools (red or black). ? Unusual bruising for unknown reasons. ? A serious fall or if you hit your head (even if there is no bleeding).  Some medicines may interact with Xarelto and might increase your risk of bleeding while on Xarelto. To help avoid this, consult your healthcare provider or pharmacist prior to using any new prescription or non-prescription medications, including herbals, vitamins, non-steroidal anti-inflammatory drugs (NSAIDs) and supplements.  This website has more information on Xarelto: [email or web address]   1. You have hospital follow up appointments as follows: 02/02/15 at 1:45PM - with Dr. Ernie Hew 02/03/15 at 8:30AM - with Dr. Sallyanne Kuster   2. Please take all medications as prescribed.    3. If you have worsening of your symptoms or new symptoms arise, please call the clinic ([phone]), or go to the ER immediately if symptoms are severe.

## 2015-01-28 NOTE — Plan of Care (Signed)
Problem: Phase II Progression Outcomes Goal: Discharge plan in place and appropriate Outcome: Completed/Met Date Met:  01/28/15 Pt discharged to home with all belongings. Transported to car per wheelchair. Discharged at 1415

## 2015-01-28 NOTE — Progress Notes (Signed)
Cardiology Progress Note  Subjective: Ms. Dockery was seen and examined this morning. She denies CP or dyspnea.  Appetite is good.  She has some irritation at site where tape was holding gauze over right femoral artery cath site.  She is wondering when she can go home.   Objective: Vital signs in last 24 hours: Filed Vitals:   01/28/15 0400 01/28/15 0505 01/28/15 0600 01/28/15 0700  BP: 161/59 120/44 160/51 168/71  Pulse: 50 48 50 53  Temp:      TempSrc:      Resp: 11 14 12 15   Height:      Weight:      SpO2: 94% 96% 93% 99%   Weight change:   Intake/Output Summary (Last 24 hours) at 01/28/15 0721 Last data filed at 01/28/15 0700  Gross per 24 hour  Intake 1959.9 ml  Output   3325 ml  Net -1365.1 ml   General:  Sitting up in bed in NAD  HEENT:  [place]/AT, no JVD or carotid bruits Cardiac: bradycardic and regular; no rubs, murmurs or gallops; distal pulse intact, 2+ right femoral Pulm: clear to auscultation bilaterally, moving normal volumes of air, no wheezes/rales/rhonchi Abd: soft, nondistended, BS present, mild lower abdominal TTP, mild erythema beneath right pannus/inguinal area Ext: warm and well perfused, trace pedal edema B/L, left radial and left brachial bandages c/d/i, bruise of right antecubital fossa  Neuro: alert and oriented X3, responding appropriately, moving extremities spontaneously  Lab Results: Basic Metabolic Panel:  Recent Labs Lab 01/27/15 0740 01/28/15 0248  NA 140 137  K 3.8 3.9  CL 103 99*  CO2 29 25  GLUCOSE 103* 126*  BUN 24* 27*  CREATININE 1.27* 1.33*  CALCIUM 9.5 9.3   CBC:  Recent Labs Lab 01/27/15 0328 01/28/15 0248  WBC 9.7 7.2  HGB 13.9 13.4  HCT 41.7 41.2  MCV 95.4 94.9  PLT 166 153   Cardiac Enzymes:  Recent Labs Lab 01/26/15 2020 01/27/15 0126 01/27/15 0740  TROPONINI <0.03 <0.03 <0.03   Hemoglobin A1C:  Recent Labs Lab 01/26/15 2020  HGBA1C 6.1*    Studies/Results: Dg Chest 2 View  01/27/2015    CLINICAL DATA:  CABG.  EXAM: CHEST  2 VIEW  COMPARISON:  07/04/2014.  FINDINGS: Mediastinum and hilar structures are normal. Stable cardiomegaly with normal pulmonary vascularity. Mild bibasilar subsegmental atelectasis and/or scarring. No pleural effusion or pneumothorax. Prior cervical spine fusion. IVC filter noted.  IMPRESSION: 1. Stable cardiomegaly.  No CHF . 2. Mild bibasilar subsegmental atelectasis.   Electronically Signed   By: Marcello Moores  Register   On: 01/27/2015 07:19   Medications: I have reviewed the patient's current medications. Scheduled Meds: . aspirin EC  81 mg Oral Daily  . docusate sodium  100 mg Oral Daily  . fenofibrate  160 mg Oral Daily  . furosemide  40 mg Oral Daily  . losartan  100 mg Oral Daily   And  . hydrochlorothiazide  25 mg Oral Daily  . isosorbide mononitrate  60 mg Oral Daily  . levothyroxine  125 mcg Oral QAC breakfast  . multivitamin with minerals  1 tablet Oral Daily  . sodium chloride  3 mL Intravenous Q12H  . sodium chloride  3 mL Intravenous Q12H   Continuous Infusions: . sodium chloride 75 mL/hr at 01/27/15 2000   PRN Meds:.sodium chloride, sodium chloride, acetaminophen, nitroGLYCERIN, ondansetron (ZOFRAN) IV, sodium chloride, sodium chloride, zolpidem   Telemetry:  Sinus brady, HR 50s 01/28/15 EKG:  Sinus brady, HR  70, normal intervals, no ST abnormalities  Assessment/Plan: 44 w/ PMH of CAD s/p BMS to pLAD and POBA to mLAD, HTN, HLD, chronic diastolic HF, DVT, CKD, PAD, left subclavian artery stenosis s/p LHC 01/26/15 direct admitted for consideration of CABG.  Non-cardiac chest pain in the setting of CAD:  S/p LHC 01/26/15.  Left main with 70% calcified stenosis with ostial circumflex involvement.  There was initial concern that she may need CABG, but after additional review by Cardiology and CVTS, CABG may not be necessary.  FFR performed pm 01/27/15 was 0.91 which suggests the stenosis is not flow limiting.  Recommendations are for medical  management as below.   - no indication for CABG at this time - continue ASA 81mg  daily, Imdur 60mg  daily, losartan 100mg  daily, fenofibrate, pravastatin (atorvastatin allergy - anaphylaxis, however she confirms she takes pravastatin at home), SL NTG prn - no BB due to sinus brady 50s - ambulate with RN - wean oxygen and check saturations - if she continues to do well, will plan on d/c this afternoon - will arrange close follow-up with PCP and with cardiologist, Dr. Sallyanne Kuster  Sinus bradycardia:  The patient says her HR is usually low, 50s-60s.  EKG sinus brady.  She is not on BB at home.  Will continue to hold BB.    Chronic diastolic HF:  Pulmonary wedge pressure elevated at 18 but no evidence of pulmonary edema.  She reports compliance with daily Lasix 40mg .  Continue above meds and po lasix.  Advise salt-free diet.    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  She says her last dose was on 05/08. - heparin gtt was d/c 01/27/15 - resume Xarelto today  Pre-DM:  hgb A1c 6.1.  Diet recommendations made.  Will have patient follow-up with PCP for monitoring.  Irritated skin of right pannus/inguinal area:  This is likely local skin reaction to tape (had been holding femoral bandage in place) which has now been removed.  She has a strong femoral pulse, no swelling of the area.   LOS: 2 days   Francesca Oman, DO IMTS PGY2 on CCU Service 01/28/2015, 7:21 AM   Attending Note:   The patient was seen and examined.  Agree with assessment and plan as noted above.  Changes made to the above note as needed.  I have reviewed the angiograms and agree that the LM stenosis does not appear to be critical  FFR shows the stenosis is not significant .   Wedge pressure is slightly  Elevated.  Advised her to avoid salt .   She may be discharged today .  Follow up with Dr.  Sallyanne Kuster   Thayer Headings, Brooke Bonito., MD, [facility] 01/28/2015, 9:34 AM [address],  [place] Pager [phone]

## 2015-01-28 NOTE — Discharge Summary (Signed)
[facility] Discharge Summary   Patient Name:  Jill Preston  MRN: [id number]  PCP: Rachell Cipro, MD  DOB:  [birth year]       Date of Admission:  01/26/2015  Date of Discharge:  01/28/2015      Attending Physician: Dr. Troy Sine, MD         DISCHARGE DIAGNOSES:: 1. CAD in native artery 2. Sinus bradycardia 3. Chronic diastolic heart failure 4. Essential hypertension 5. History of left leg DVT w/ IVC filter and on Xarelto 6. Pre-diabetes    DISPOSITION AND FOLLOW-UP: Jill Preston is to follow-up with the listed providers as detailed below.     Follow-up Information    Follow up with [facility], MD On 02/02/2015.   Specialty:  Family Medicine   Why:  for hospital follow-up 1:45PM   Contact information:   [address] [place] [address] [phone]       Follow up with Sanda Klein, MD On 02/03/2015.   Specialty:  Cardiology   Why:  for hospital follow-up at 8:30AM   Contact information:   [address] [place] [address] [phone]        DISCHARGE MEDICATIONS:   Medication List    STOP taking these medications        losartan 100 MG tablet  Commonly known as:  COZAAR      TAKE these medications        acetaminophen 650 MG CR tablet  Commonly known as:  TYLENOL  Take 1,300 mg by mouth every 8 (eight) hours as needed for pain.     aspirin EC 81 MG tablet  Take 1 tablet (81 mg total) by mouth daily.     CULTURELLE DIGESTIVE HEALTH PO  Take 1 tablet by mouth daily.     docusate sodium 100 MG capsule  Commonly known as:  COLACE  Take 100 mg by mouth daily.     fenofibrate 160 MG tablet  Take 160 mg by mouth daily.     furosemide 20 MG tablet  Commonly known as:  LASIX  Take 2 tablets (40 mg total) by mouth daily.     isosorbide mononitrate 60 MG 24 hr tablet  Commonly known as:  IMDUR  Take 1 tablet (60 mg total) by mouth daily.     levothyroxine 125 MCG tablet  Commonly known as:   SYNTHROID, LEVOTHROID  Take 125 mcg by mouth daily before breakfast.     losartan-hydrochlorothiazide 100-25 MG per tablet  Commonly known as:  HYZAAR  Take 1 tablet by mouth daily.     Melatonin 3 MG Caps  Take 1 capsule by mouth at bedtime as needed (sleep).     multivitamin with minerals Tabs tablet  Take 1 tablet by mouth daily. Centrum 50+     nitroGLYCERIN 0.4 MG SL tablet  Commonly known as:  NITROSTAT  Place 1 tablet (0.4 mg total) under the tongue every 5 (five) minutes as needed for chest pain.     nystatin ointment  Commonly known as:  MYCOSTATIN  Apply 1 application topically at bedtime.     PRAVACHOL 20 MG tablet  Generic drug:  pravastatin  Take 40 mg by mouth daily.     rivaroxaban 20 MG Tabs tablet  Commonly known as:  XARELTO  Take 20 mg by mouth daily with supper.         CONSULTS:   Lanelle Bal, MD (Cardiothoracic Surgeon)   PROCEDURES PERFORMED:  Dg Chest 2 View  01/27/2015   CLINICAL DATA:  CABG.  EXAM: CHEST  2 VIEW  COMPARISON:  07/04/2014.  FINDINGS: Mediastinum and hilar structures are normal. Stable cardiomegaly with normal pulmonary vascularity. Mild bibasilar subsegmental atelectasis and/or scarring. No pleural effusion or pneumothorax. Prior cervical spine fusion. IVC filter noted.  IMPRESSION: 1. Stable cardiomegaly.  No CHF . 2. Mild bibasilar subsegmental atelectasis.   Electronically Signed   By: [place]   On: 01/27/2015 07:19   01/26/15 LHC    LM lesion, 70% stenosed.  Ost Cx lesion, 60% stenosed.  Prox RCA-1 lesion, 20% stenosed.  Prox RCA-2 lesion, 30% stenosed.  Hyperdynamic LV function with significant left ventricular hypertrophy and an ejection fraction of at least 70%.  Coronary obstructive disease with focal calcification of the distal left main with 70% distal left main stenosis, widely patent stent in the LAD after the first diagonal vessel, 60% ostial left circumflex stenosis prior to a very high marginal,  almost ramus intermediate like vessel and mild 20 and 30% narrowings in the proximal dominant RCA.  IVC filter visualized in the inferior vena cava.  Normal left ventricular end-diastolic pressure.  RECOMMENDATION:  In light of the focal distal calcified shelflike left main stenosis with ostial circumflex encroachment, surgical consultation will be obtained for consideration of CABG revascularization surgery.  01/27/15 RHC and FFR  Ost Cx lesion, 60% stenosed.  LM lesion, 50% stenosed.  1. Moderate distal left main artery stenosis with fractional flow reserve assessment suggesting the lesion is not flow limiting. (FFR=0.91) 2. Moderate ostial Circumflex stenosis  Recommendations: Medical management of CAD. I would not pursue revascularization strategies at this time. Her chest pressure has been present continuously for months with no relief suggesting it is not cardiac related.     ADMISSION DATA: Patient presented for outpatient R and LHC on 01/26/15 and was found to have a 70% stenosis of left main so she was direct admitted for evaluation for potential CABG.  [name] has presented today for surgery, with the diagnosis of cp/shortness of breath The various methods of treatment have been discussed with the patient and family. After consideration of risks, benefits and other options for treatment, the patient has consented to Procedure(s): Right/Left Heart Cath and Coronary Angiography (N/A) as a surgical intervention . The patient's history has been reviewed, patient examined, no change in status, stable for surgery. I have reviewed the patient's chart and labs. Questions were answered to the patient's satisfaction.   [place] COURSE:  Non-cardiac chest pain in the setting of CAD: The patient reported constant left sided chest and arm pressure for several months duration.  She was direct admitted to cardiology service after her [place] on 01/26/15  due to significant left main disease. Left main with 70% calcified stenosis with ostial circumflex involvement. There was initial concern that she may need CABG.  After further review by CVTS and Cardiology, it was felt the lesion may not be as stenotic and further investigation with Fractional flow reserve (FFR) was pursued.  FFR was performed and was 0.91 which suggests the stenosis is not flow limiting and likely not responsible for the patient's chronic chest pain.  CVTS and Cardiology both agree that there is no indication for CABG at this time.  Recommendations are for continued medical management. Patient's home medications (including ASA, Imdur, losartan-HCTZ, fenofibrate, SL NTG prn, pravastatin were continued.  The patient has an atorvastatin allergy (? Anaphylaxis) however she confirms she  takes pravastatin at home.  She is not on a beta blocker and we did not add one due to sinus bradycardia.  She confirmed that her HR is usually 50s-60s.  It is unclear what is causing her constant chest pressure of several months duration but this would be unlikely for pain of cardiac etiology.  There is very low likelihood of PE since she has an IVC filter and is on A/C.  She had no chest pain, was breathing comfortably on room and able to ambulate around the unit without problem by day of discharge.  She was discharged in stable condition.  She will follow-up with her PCP and her cardiologist next week (dates and times listed above).  Sinus bradycardia: EKG with sinus bradycardia.  She was asymptomatic and confirmed that her HR is usually 50s-60s.     Chronic diastolic HF: RHC revealed an elevated pulmonary wedge pressure of 18.  She appeared euvolemic.  Home medications, including daily po Lasix 40mg  was continued during admission. She was counseled on avoiding dietary salt.    Hypertension:  Home medications were continued.   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  Heparin was discontinued after procedures were complete.  Xarelto was resumed on day of discharge.   Pre-diabetes: Hgb A1c was 6.1 this admission.  The patient says she has been told her A1c was in pre-DM range before.  Diet recommendations were made. She will follow-up with PCP for monitoring.   DISCHARGE DATA: Vital Signs: BP 101/66 mmHg  Pulse 58  Temp(Src) 98.2 F (36.8 C) (Oral)  Resp 19  Ht 5' 5.5" (1.664 m)  Wt 213 lb (96.616 kg)  BMI 34.89 kg/m2  SpO2 96%  Labs: Results for orders placed or performed during the hospital encounter of 01/26/15 (from the past 24 hour(s))  I-STAT 3, arterial blood gas (G3+)     Status: Abnormal   Collection Time: 01/27/15  2:06 PM  Result Value Ref Range   pH, Arterial 7.365 7.350 - 7.450   pCO2 arterial 46.5 (H) 35.0 - 45.0 mmHg   pO2, Arterial 79.0 (L) 80.0 - 100.0 mmHg   Bicarbonate 26.6 (H) 20.0 - 24.0 mEq/L   TCO2 28 0 - 100 mmol/L   O2 Saturation 95.0 %   Acid-Base Excess 1.0 0.0 - 2.0 mmol/L   Sample type ARTERIAL   I-STAT 3, venous blood gas (G3P V)     Status: Abnormal   Collection Time: 01/27/15  2:08 PM  Result Value Ref Range   pH, Ven 7.357 (H) 7.250 - 7.300   pCO2, Ven 37.2 (L) 45.0 - 50.0 mmHg   pO2, Ven 39.0 30.0 - 45.0 mmHg   Bicarbonate 20.8 20.0 - 24.0 mEq/L   TCO2 22 0 - 100 mmol/L   O2 Saturation 71.0 %   Acid-base deficit 4.0 (H) 0.0 - 2.0 mmol/L   Sample type VENOUS    Comment NOTIFIED PHYSICIAN   POCT Activated clotting time     Status: None   Collection Time: 01/27/15  2:18 PM  Result Value Ref Range   Activated Clotting Time 294 seconds  POCT Activated clotting time     Status: None   Collection Time: 01/27/15  2:47 PM  Result Value Ref Range   Activated Clotting Time 239 seconds  Basic metabolic panel     Status: Abnormal   Collection Time: 01/28/15  2:48 AM  Result Value  Ref Range   Sodium 137 135 - 145 mmol/L   Potassium 3.9 3.5 - 5.1  mmol/L   Chloride 99 (L) 101 - 111 mmol/L   CO2 25 22 - 32 mmol/L   Glucose, Bld 126 (H) 65 - 99 mg/dL   BUN 27 (H) 6 - 20 mg/dL   Creatinine, Ser 1.33 (H) 0.44 - 1.00 mg/dL   Calcium 9.3 8.9 - 10.3 mg/dL   GFR calc non Af Amer 37 (L) >60 mL/min   GFR calc Af Amer 43 (L) >60 mL/min   Anion gap 13 5 - 15  CBC     Status: None   Collection Time: 01/28/15  2:48 AM  Result Value Ref Range   WBC 7.2 4.0 - 10.5 K/uL   RBC 4.34 3.87 - 5.11 MIL/uL   Hemoglobin 13.4 12.0 - 15.0 g/dL   HCT 41.2 36.0 - 46.0 %   MCV 94.9 78.0 - 100.0 fL   MCH 30.9 26.0 - 34.0 pg   MCHC 32.5 30.0 - 36.0 g/dL   RDW 12.8 11.5 - 15.5 %   Platelets 153 150 - 400 K/uL     Services Ordered on Discharge: Y = Yes; Blank = No PT:   OT:   RN:   Equipment:   Other:      Time Spent on Discharge: 35 min   Signed: Duwaine Maxin DO PGY 2, Internal Medicine Resident on CCU 01/28/2015, 1:18 PM   Attending Note:   The patient was seen and examined.  Agree with assessment and plan as noted above.  Changes made to the above note as needed.  Please see my progress note from day of discharge.  Repeat evaluation of the LM stenosis shows that it is not critical . will continue with medical therapy.   Thayer Headings, Brooke Bonito., MD, [facility] 01/31/2015, 1:22 PM [address],  [place] Pager [phone]

## 2015-02-02 ENCOUNTER — Telehealth: Payer: Self-pay | Admitting: Cardiovascular Disease

## 2015-02-02 NOTE — Telephone Encounter (Signed)
Appt confirmed for tomorrow w/ Dr. Sallyanne Kuster.

## 2015-02-02 NOTE — Telephone Encounter (Signed)
Needs a TOC phone call .. Thanks  °

## 2015-02-03 ENCOUNTER — Encounter: Payer: Self-pay | Admitting: Cardiovascular Disease

## 2015-02-03 ENCOUNTER — Ambulatory Visit (INDEPENDENT_AMBULATORY_CARE_PROVIDER_SITE_OTHER): Payer: Commercial Managed Care - HMO | Admitting: Cardiovascular Disease

## 2015-02-03 VITALS — BP 102/84 | HR 72 | Ht 65.0 in | Wt 210.6 lb

## 2015-02-03 DIAGNOSIS — I25118 Atherosclerotic heart disease of native coronary artery with other forms of angina pectoris: Secondary | ICD-10-CM

## 2015-02-03 DIAGNOSIS — I110 Hypertensive heart disease with heart failure: Secondary | ICD-10-CM | POA: Diagnosis not present

## 2015-02-03 DIAGNOSIS — I2510 Atherosclerotic heart disease of native coronary artery without angina pectoris: Secondary | ICD-10-CM

## 2015-02-03 DIAGNOSIS — I872 Venous insufficiency (chronic) (peripheral): Secondary | ICD-10-CM | POA: Diagnosis not present

## 2015-02-03 DIAGNOSIS — I5032 Chronic diastolic (congestive) heart failure: Secondary | ICD-10-CM

## 2015-02-03 DIAGNOSIS — I771 Stricture of artery: Secondary | ICD-10-CM

## 2015-02-03 DIAGNOSIS — I509 Heart failure, unspecified: Secondary | ICD-10-CM

## 2015-02-03 DIAGNOSIS — R0602 Shortness of breath: Secondary | ICD-10-CM | POA: Diagnosis not present

## 2015-02-03 DIAGNOSIS — E669 Obesity, unspecified: Secondary | ICD-10-CM

## 2015-02-03 DIAGNOSIS — I10 Essential (primary) hypertension: Secondary | ICD-10-CM

## 2015-02-03 DIAGNOSIS — E785 Hyperlipidemia, unspecified: Secondary | ICD-10-CM

## 2015-02-03 DIAGNOSIS — I708 Atherosclerosis of other arteries: Secondary | ICD-10-CM | POA: Diagnosis not present

## 2015-02-03 NOTE — Patient Instructions (Signed)
Your physician has recommended that you have a pulmonary function test with and without bronchodilators. Pulmonary Function Tests are a group of tests that measure how well air moves in and out of your lungs.  STOP Fenofibrate.  Dr. Sallyanne Kuster recommends that you schedule a follow-up appointment in: 4 months.

## 2015-02-05 NOTE — Progress Notes (Signed)
Patient ID: Jill Preston, female   DOB: Nov 22, 1932, 81 y.o.   MRN: [id number]     Cardiology Office Note   Date:  02/05/2015   ID:  Jill Preston, DOB January 03, 1933, MRN [id number]  PCP:  Rachell Cipro, MD  Cardiologist:   Sanda Klein, MD   Chief Complaint  Patient presents with  . Follow-up    Post cardiac cath:  SOB with minimal activity.      History of Present Illness: Jill Preston is a 81 y.o. female who presents for follow-up after hospitalization for chest pain and repeat cardiac catheterization.  She has a long history of coronary disease. She underwent proximal LAD bare-metal stenting and balloon angioplasty of the mid LAD in 2013. She has had persistent ischemia in the territory of the diagonal artery on nuclear stress test performed over the last few years. She has preserved left ventricular systolic function but has had episodes of acute exacerbation of diastolic heart failure attributed to hypertensive heart disease. Last December she was critically ill with an acute left iliofemoral DVT with anticoagulation complicated by retroperitoneal hematoma and hemorrhagic shock, requiring placement of an inferior vena cava filter. There was a plan to remove her IVC filter this month but she was then hospitalized with chest pain.  On May 11 coronary angiography performed by Dr. Ellouise Newer estimated there was a 70% stenosis of the left coronary artery, 60% stenosis of the ostium of the left circumflex coronary artery and 20-30%stenosis in the right coronary artery. The LAD stent was widely patent. LVEDP was normal. Surgical consultation was obtained but there was some uncertainty regarding the severity of the left main coronary stenosis. Fractional flow reserve was performed by Dr. Darlina Guys May 12 in the FFR was 0.96 at baseline and 0.91 during IV adenosine infusion. Medical management of her coronary disease was then recommended.  In the past her angina seemed to  respond well to Ranexa, but this medication caused severe constipation and Mrs. Ogden could not tolerate it. She is not receiving beta blockers due to previous problems with bradycardia.  Past Medical History  Diagnosis Date  . Chronic venous insufficiency     LEA VENOUS, 10/17/2011 - mild reflux in bilateral common femoral veins  . Hyperlipidemia   . Coronary artery disease     a. s/p PCI/BMS to prox LAD and balloon angioplasty to mLAD with suboptimal result in 2000. b. Abnl nuc 2012, cath 09/2011 - showed that the overall territory of potential ischemia was small and attributable to a moderately diseased small second diagonal artery. Med rx.  . Hypertension   . Chronic diastolic CHF (congestive heart failure)   . Hypothyroidism   . Hypertensive heart disease   . CKD (chronic kidney disease), stage III   . Obesity   . Arthritis   . Stroke     pt. states she had "light stroke" in sept. 1980    Past Surgical History  Procedure Laterality Date  . Cardiac catheterization Left 09/25/2011    Medical management  . Cardiac catheterization Left 03/25/2001    Normal LV function, LAD residual narrowing of less than 10%, normal ramus intermediate, circumflex, and RCA,   . Cardiac catheterization  09/04/1999    LAD, 3x60mm Tetra stent resulting in a reduction of the 80% stenosis to 0% residual  . Coronary stent placement      LAD  . Bladder surgery  2010    WITH MESH   . Cholecystectomy    . Abdominal  hysterectomy    . Rotator cuff repair Right   . Left heart catheterization with coronary angiogram N/A 09/25/2011    Procedure: LEFT HEART CATHETERIZATION WITH CORONARY ANGIOGRAM;  Surgeon: Sanda Klein, MD;  Location: [facility];  Service: Cardiovascular;  Laterality: N/A;  . Cardiac catheterization N/A 01/26/2015    Procedure: Right/Left Heart Cath and Coronary Angiography;  Surgeon: Troy Sine, MD;  Location: [facility];  Service: Cardiovascular;  Laterality: N/A;  . Cardiac  catheterization N/A 01/27/2015    Procedure: Intravascular Pressure Wire/FFR Study;  Surgeon: Burnell Blanks, MD;  Location: [facility];  Service: Cardiovascular;  Laterality: N/A;  . Cardiac catheterization N/A 01/27/2015    Procedure: Right Heart Cath;  Surgeon: Burnell Blanks, MD;  Location: [facility];  Service: Cardiovascular;  Laterality: N/A;     Current Outpatient Prescriptions  Medication Sig Dispense Refill  . acetaminophen (TYLENOL) 650 MG CR tablet Take 1,300 mg by mouth every 8 (eight) hours as needed for pain.     Marland Kitchen aspirin EC 81 MG tablet Take 1 tablet (81 mg total) by mouth daily. 90 tablet 3  . docusate sodium (COLACE) 100 MG capsule Take 100 mg by mouth daily.    . furosemide (LASIX) 20 MG tablet Take 2 tablets (40 mg total) by mouth daily. 60 tablet 12  . isosorbide mononitrate (IMDUR) 60 MG 24 hr tablet Take 1 tablet (60 mg total) by mouth daily. 90 tablet 2  . Lactobacillus-Inulin (CULTURELLE DIGESTIVE HEALTH PO) Take 1 tablet by mouth daily.    Marland Kitchen levothyroxine (SYNTHROID, LEVOTHROID) 125 MCG tablet Take 125 mcg by mouth daily before breakfast.    . losartan-hydrochlorothiazide (HYZAAR) 100-25 MG per tablet Take 1 tablet by mouth daily.    . Melatonin 3 MG CAPS Take 1 capsule by mouth at bedtime as needed (sleep).     . Multiple Vitamin (MULTIVITAMIN WITH MINERALS) TABS tablet Take 1 tablet by mouth daily. Centrum 50+    . nitroGLYCERIN (NITROSTAT) 0.4 MG SL tablet Place 1 tablet (0.4 mg total) under the tongue every 5 (five) minutes as needed for chest pain. 25 tablet 2  . nystatin ointment (MYCOSTATIN) Apply 1 application topically at bedtime.   0  . pravastatin (PRAVACHOL) 20 MG tablet Take 40 mg by mouth daily.     . rivaroxaban (XARELTO) 20 MG TABS tablet Take 20 mg by mouth daily with supper.     No current facility-administered medications for this visit.    Allergies:   Atorvastatin; Latex; Shellfish allergy; Eggs or egg-derived  products; Ranexa; Ciprofloxacin; Codeine; Contrast media; Gelnique; and Penicillins    Social History:  The patient  reports that she has never smoked. She has never used smokeless tobacco. She reports that she does not drink alcohol or use illicit drugs.   Family History:  The patient's family history includes Cancer in her mother; Heart disease in her brother, father, and sister.    ROS:  Please see the history of present illness.    Otherwise, review of systems positive for lower extremity edema, shortness of breath with light activity..   All other systems are reviewed and negative.    PHYSICAL EXAM: VS:  BP 102/84 mmHg  Pulse 72  Ht 5\' 5"  (1.651 m)  Wt 95.528 kg (210 lb 9.6 oz)  BMI 35.05 kg/m2 , BMI Body mass index is 35.05 kg/(m^2).  General: Alert, oriented x3, no distress Head: no evidence of trauma, PERRL, EOMI, no  exophtalmos or lid lag, no myxedema, no xanthelasma; normal ears, nose and oropharynx Neck: normal jugular venous pulsations and no hepatojugular reflux; brisk carotid pulses without delay and no carotid bruits Chest: clear to auscultation, no signs of consolidation by percussion or palpation, normal fremitus, symmetrical and full respiratory excursions Cardiovascular: normal position and quality of the apical impulse, regular rhythm, normal first and second heart sounds, no murmurs, rubs or gallops Abdomen: no tenderness or distention, no masses by palpation, no abnormal pulsatility or arterial bruits, normal bowel sounds, no hepatosplenomegaly Extremities: no clubbing, cyanosis or edema; 2+ radial, ulnar and brachial pulses bilaterally; 2+ right femoral, posterior tibial and dorsalis pedis pulses; 2+ left femoral, posterior tibial and dorsalis pedis pulses; no subclavian or femoral bruits Neurological: grossly nonfocal Psych: euthymic mood, full affect   EKG:  EKG is not ordered today.   Recent Labs: 07/04/2014: Pro B Natriuretic peptide (BNP)  872.5* 08/25/2014: TSH 0.689 09/05/2014: ALT 20 01/28/2015: BUN 27*; Creatinine 1.33*; Hemoglobin 13.4; Platelets 153; Potassium 3.9; Sodium 137    Lipid Panel    Component Value Date/Time   CHOL 162 04/18/2013 0826   TRIG 140 04/18/2013 0826   HDL 42 04/18/2013 0826   CHOLHDL 3.9 04/18/2013 0826   VLDL 28 04/18/2013 0826   LDLCALC 92 04/18/2013 0826      Wt Readings from Last 3 Encounters:  02/03/15 95.528 kg (210 lb 9.6 oz)  01/26/15 96.616 kg (213 lb)  01/14/15 96.525 kg (212 lb 12.8 oz)      Other studies Reviewed: Additional studies/ records that were reviewed today include: Reports and images of recent cardiac catheterization procedures on May 11 and May 12, discharge summary, ECG and labs.  ASSESSMENT AND PLAN:  CAD s/p LAD stent, moderate LMCA stenosis Mrs. Thurmond's symptoms have been troublesome for a long time now. She has not had any major coronary events since 2013. We'll continue with medical therapy. She has had side effects with potent statins in the past and is on a combination of pravastatin and fenofibrate. The benefit of fenofibrate is quite questionable and might be contracting to some of her other somatic complaints. Will stop it and reevaluate her lipid profile. She is receiving a moderate dose of long-acting nitrates. Consider adding amlodipine instead of her other antihypertensive medications. However this will likely worsen her lower showed edema.  Diastolic heart failure, chronic Functional status remains mediocre. Probably at/close to euvolemia. LVEDP normal at cath. Excellent BP control. She had dyspnea in the setting of normal intracardiac filling pressures, suggesting a noncardiac cause for her dyspnea. She has a recent pulmonary embolism and I'm sure her obesity is contributory. Will check pulmonary function tests as well.  Dyslipidemia Fair lipid profile. Ideally LDL would be <70, but intolerant to more potent statins like atorvastatin.Crestor is  too costly. Note report of abdominal MR images suggestive of cirrhosis, but no clinical or biochemical findings to support this. Stop fenofibrate, of very questionable benefit and increased risk of side effects.  Venous insufficiency and recent left iliofemoral DVT Recommended improved compliance with compression stockings. Plan for IVC filter removal in future. Continue anticoagulants as long as filter is in place.  Left subclavian artery stenosis without evidence of subclavian steal Blood pressure should always be checked in the right upper extremity.     Current medicines are reviewed at length with the patient today.  The patient has concerns regarding medicines.  The following changes have been made:  Stop fenofibrate  Labs/ tests ordered today include:  Orders Placed This Encounter  Procedures  . Pulmonary Function Test    Patient Instructions  Your physician has recommended that you have a pulmonary function test with and without bronchodilators. Pulmonary Function Tests are a group of tests that measure how well air moves in and out of your lungs.  STOP Fenofibrate.  Dr. Sallyanne Kuster recommends that you schedule a follow-up appointment in: 4 months.      Mikael Spray, MD  02/05/2015 4:40 PM    Sanda Klein, MD, [facility] [phone] office [phone] pager

## 2015-02-09 ENCOUNTER — Ambulatory Visit (HOSPITAL_COMMUNITY)
Admission: RE | Admit: 2015-02-09 | Discharge: 2015-02-09 | Disposition: A | Discharge status: Home / Self Care | Payer: Commercial Managed Care - HMO | Source: Ambulatory Visit | Attending: Cardiovascular Disease | Admitting: Cardiovascular Disease

## 2015-02-09 DIAGNOSIS — R0602 Shortness of breath: Secondary | ICD-10-CM

## 2015-02-09 LAB — PULMONARY FUNCTION TEST
DL/VA % pred: 103 %
DL/VA: 4.96 ml/min/mmHg/L
DLCO cor % pred: 79 %
DLCO cor: 19.32 ml/min/mmHg
DLCO unc % pred: 79 %
DLCO unc: 19.32 ml/min/mmHg
FEF 25-75 Post: 2.72 L/sec
FEF 25-75 Pre: 2.44 L/sec
FEF2575-%Change-Post: 11 %
FEF2575-%Pred-Post: 190 %
FEF2575-%Pred-Pre: 170 %
FEV1-%Change-Post: 3 %
FEV1-%Pred-Post: 117 %
FEV1-%Pred-Pre: 112 %
FEV1-Post: 2.3 L
FEV1-Pre: 2.21 L
FEV1FVC-%Change-Post: 2 %
FEV1FVC-%Pred-Pre: 111 %
FEV6-%Change-Post: 1 %
FEV6-%Pred-Post: 109 %
FEV6-%Pred-Pre: 107 %
FEV6-Post: 2.72 L
FEV6-Pre: 2.67 L
FEV6FVC-%Pred-Post: 105 %
FEV6FVC-%Pred-Pre: 105 %
FVC-%Change-Post: 1 %
FVC-%Pred-Post: 103 %
FVC-%Pred-Pre: 101 %
FVC-Post: 2.72 L
FVC-Pre: 2.67 L
Post FEV1/FVC ratio: 85 %
Post FEV6/FVC ratio: 100 %
Pre FEV1/FVC ratio: 83 %
Pre FEV6/FVC Ratio: 100 %
RV % pred: 90 %
RV: 2.16 L
TLC % pred: 96 %
TLC: 4.85 L

## 2015-02-09 MED ORDER — ALBUTEROL SULFATE (2.5 MG/3ML) 0.083% IN NEBU
2.5000 mg | INHALATION_SOLUTION | Freq: Once | RESPIRATORY_TRACT | Status: AC
  Administered 2015-02-09: 2.5 mg via RESPIRATORY_TRACT

## 2015-02-15 DIAGNOSIS — Z95828 Presence of other vascular implants and grafts: Secondary | ICD-10-CM | POA: Insufficient documentation

## 2015-02-15 HISTORY — PX: ILIAC VEIN ANGIOPLASTY / STENTING: SHX1788

## 2015-02-15 HISTORY — PX: IVC FILTER REMOVAL: CATH118246

## 2015-03-04 ENCOUNTER — Telehealth: Payer: Self-pay | Admitting: Cardiovascular Disease

## 2015-03-04 NOTE — Telephone Encounter (Signed)
Spoke w/ patient. She had BP elevation of 165/101, HR 64 which was with activity. When rechecked a few minutes later on the phone with me, her BP was 141/78, HR 61.  She had no symptoms w/e of her dyspnea upon walking, which is an ongoing concern. She had recent PFT, was recommended to f/u w/ pulmonology. She is still waiting on PCP to give her word on this.  Advised her to call PCP, see if Pulmonology referral has been sent.  Advised her to call for worsening/different/concerning symptoms - pt verbalized understanding.

## 2015-03-04 NOTE — Telephone Encounter (Signed)
Spoke to patient - states she called PCP and they had not received report.  Called Dr. Ival Bible office at [phone], inquired - faxed PFT results to their office fax [phone]

## 2015-03-04 NOTE — Telephone Encounter (Signed)
Pt says she needs to talk to you again.

## 2015-03-04 NOTE — Telephone Encounter (Signed)
Pt says her blood pressure is 165/101 and pulse rate is 64. She wants to know if there is anything she can take,so she can be able to walk. She cant even walk yards without getting out of breath.

## 2015-03-09 ENCOUNTER — Telehealth: Payer: Self-pay | Admitting: Cardiovascular Disease

## 2015-03-09 NOTE — Telephone Encounter (Signed)
Mrs. Gloeckner is calling to see if she can get some samples of Xarelto .Marland Kitchen Please call    Thanks

## 2015-03-09 NOTE — Telephone Encounter (Signed)
Returned call to patient Jill Preston 20 mg samples left at front desk of [facility] office. 

## 2015-03-28 ENCOUNTER — Telehealth: Payer: Self-pay | Admitting: Cardiovascular Disease

## 2015-03-28 NOTE — Telephone Encounter (Signed)
Pt called in stating that when she took her BP this morning and it seemed normal but as the day progressed, her BP started dropping and now her monitor reads that she has an irregular heart beat. She would like to be advised on what to do.   Thanks

## 2015-03-28 NOTE — Telephone Encounter (Signed)
Can this encounter be closed?

## 2015-03-28 NOTE — Telephone Encounter (Signed)
Patient states her BP this AM was 111/70 HR of 55.  After lunch she check again because she felt like her heart was irregular BP 104/62 HR 60.  States her minister (EMT in the past) told her in the past to take 2 NTG when she felt like this.  She did and now she feels better.  States she place 2 NTG under her tongue at the same time.  Instructed her not to do that again and NTG is normally used for chest pain.  But states she feels fine now and doesn't feel she needs any assistance.  Will call if she experiences any more problems.

## 2015-06-16 ENCOUNTER — Ambulatory Visit (INDEPENDENT_AMBULATORY_CARE_PROVIDER_SITE_OTHER): Payer: Commercial Managed Care - HMO | Admitting: Cardiovascular Disease

## 2015-06-16 ENCOUNTER — Encounter: Payer: Self-pay | Admitting: Cardiovascular Disease

## 2015-06-16 ENCOUNTER — Ambulatory Visit (HOSPITAL_COMMUNITY)
Admission: RE | Admit: 2015-06-16 | Discharge: 2015-06-16 | Disposition: A | Discharge status: Home / Self Care | Payer: Commercial Managed Care - HMO | Source: Ambulatory Visit | Attending: Cardiovascular Disease | Admitting: Cardiovascular Disease

## 2015-06-16 VITALS — BP 106/62 | HR 69 | Resp 16 | Ht 65.0 in | Wt 203.0 lb

## 2015-06-16 DIAGNOSIS — N183 Chronic kidney disease, stage 3 unspecified: Secondary | ICD-10-CM

## 2015-06-16 DIAGNOSIS — E669 Obesity, unspecified: Secondary | ICD-10-CM

## 2015-06-16 DIAGNOSIS — I872 Venous insufficiency (chronic) (peripheral): Secondary | ICD-10-CM | POA: Diagnosis not present

## 2015-06-16 DIAGNOSIS — I5032 Chronic diastolic (congestive) heart failure: Secondary | ICD-10-CM

## 2015-06-16 DIAGNOSIS — Z79899 Other long term (current) drug therapy: Secondary | ICD-10-CM

## 2015-06-16 DIAGNOSIS — I129 Hypertensive chronic kidney disease with stage 1 through stage 4 chronic kidney disease, or unspecified chronic kidney disease: Secondary | ICD-10-CM | POA: Diagnosis not present

## 2015-06-16 DIAGNOSIS — I2510 Atherosclerotic heart disease of native coronary artery without angina pectoris: Secondary | ICD-10-CM

## 2015-06-16 DIAGNOSIS — I82403 Acute embolism and thrombosis of unspecified deep veins of lower extremity, bilateral: Secondary | ICD-10-CM | POA: Insufficient documentation

## 2015-06-16 DIAGNOSIS — E785 Hyperlipidemia, unspecified: Secondary | ICD-10-CM | POA: Diagnosis not present

## 2015-06-16 DIAGNOSIS — I509 Heart failure, unspecified: Secondary | ICD-10-CM

## 2015-06-16 DIAGNOSIS — I25118 Atherosclerotic heart disease of native coronary artery with other forms of angina pectoris: Secondary | ICD-10-CM

## 2015-06-16 DIAGNOSIS — I708 Atherosclerosis of other arteries: Secondary | ICD-10-CM

## 2015-06-16 DIAGNOSIS — I110 Hypertensive heart disease with heart failure: Secondary | ICD-10-CM | POA: Diagnosis not present

## 2015-06-16 DIAGNOSIS — I771 Stricture of artery: Secondary | ICD-10-CM

## 2015-06-16 DIAGNOSIS — I119 Hypertensive heart disease without heart failure: Secondary | ICD-10-CM | POA: Insufficient documentation

## 2015-06-16 NOTE — Progress Notes (Signed)
Patient ID: DARNISE MONTAG, female   DOB: 01-08-1933, 81 y.o.   MRN: [id number]     Cardiology Office Note   Date:  06/17/2015   ID:  MARCHETTA NAVRATIL, DOB August 04, 1933, MRN [id number]  PCP:  Rachell Cipro, MD  Cardiologist:   Sanda Klein, MD   Chief Complaint  Patient presents with  . 4 MONTHS    Patient has no complaints.      History of Present Illness: FRANCELLA BARNETT is a 81 y.o. female who presents for  Follow-up coronary artery disease and diastolic heart failure, previous deep venous thrombosis of the lower extremities , hypertension and hyperlipidemia.   she had a recent fall (not associated with syncope or dizziness) and developed a very large bruise over her right hip and right leg swelling. 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   She has had 2 episodes that sound vasovagal in etiology and associated with some type of abdominal distress. On both occasions she had severe cramping pain in her lower abdomen, associated with cold sweats, flushing and dizziness , that improved after she laid down.  She did not lose consciousness.  She has a long history of coronary disease. She underwent proximal LAD bare-metal stenting and balloon angioplasty of the mid LAD in 2013. She has had persistent ischemia in the territory of the diagonal artery on nuclear stress test performed over the last few years. She has preserved left ventricular systolic function but has had episodes of acute exacerbation of diastolic heart failure attributed to hypertensive heart disease. Last December she was critically ill with an acute left iliofemoral DVT with anticoagulation complicated by retroperitoneal hematoma and hemorrhagic shock, requiring placement of an inferior vena cava filter. The filter was removed in June 2016.  On May 11 coronary angiography performed by Dr. Ellouise Newer estimated there was a 70% stenosis of the left coronary  artery, 60% stenosis of the ostium of the left circumflex coronary artery and 20-30%stenosis in the right coronary artery. The LAD stent was widely patent. LVEDP was normal. Surgical consultation was obtained but there was some uncertainty regarding the severity of the left main coronary stenosis. Fractional flow reserve was performed by Dr. Darlina Guys May 12 in the FFR was 0.96 at baseline and 0.91 during IV adenosine infusion. Medical management of her coronary disease was then recommended.  In the past her angina seemed to respond well to Ranexa, but this medication caused severe constipation and Mrs. Heidler could not tolerate it. She is not receiving beta blockers due to previous problems with bradycardia.    Past Medical History  Diagnosis Date  . Chronic venous insufficiency     LEA VENOUS, 10/17/2011 - mild reflux in bilateral common femoral veins  . Hyperlipidemia   . Coronary artery disease     a. s/p PCI/BMS to prox LAD and balloon angioplasty to mLAD with suboptimal result in 2000. b. Abnl nuc 2012, cath 09/2011 - showed that the overall territory of potential ischemia was small and attributable to a moderately diseased small second diagonal artery. Med rx.  . Hypertension   . Chronic diastolic CHF (congestive heart failure)   . Hypothyroidism   . Hypertensive heart disease   . CKD (chronic kidney disease), stage III   . Obesity   . Arthritis   . Stroke     pt. states she had "light stroke" in sept. 1980    Past Surgical History  Procedure Laterality Date  . Cardiac catheterization Left 09/25/2011    Medical management  . Cardiac catheterization Left 03/25/2001    Normal LV function, LAD residual narrowing of less than 10%, normal ramus intermediate, circumflex, and RCA,   . Cardiac catheterization  09/04/1999    LAD, 3x3mm Tetra stent resulting in a reduction of the 80% stenosis to 0% residual  . Coronary stent placement      LAD  . Bladder surgery  2010    WITH MESH     . Cholecystectomy    . Abdominal hysterectomy    . Rotator cuff repair Right   . Left heart catheterization with coronary angiogram N/A 09/25/2011    Procedure: LEFT HEART CATHETERIZATION WITH CORONARY ANGIOGRAM;  Surgeon: Sanda Klein, MD;  Location: [place] [facility];  Service: Cardiovascular;  Laterality: N/A;  . Cardiac catheterization N/A 01/26/2015    Procedure: Right/Left Heart Cath and Coronary Angiography;  Surgeon: Troy Sine, MD;  Location: [place] [facility];  Service: Cardiovascular;  Laterality: N/A;  . Cardiac catheterization N/A 01/27/2015    Procedure: Intravascular Pressure Wire/FFR Study;  Surgeon: Burnell Blanks, MD;  Location: [place] [facility];  Service: Cardiovascular;  Laterality: N/A;  . Cardiac catheterization N/A 01/27/2015    Procedure: Right Heart Cath;  Surgeon: Burnell Blanks, MD;  Location: [place] [facility];  Service: Cardiovascular;  Laterality: N/A;     Current Outpatient Prescriptions  Medication Sig Dispense Refill  . acetaminophen (TYLENOL) 650 MG CR tablet Take 1,300 mg by mouth every 8 (eight) hours as needed for pain.     Marland Kitchen aspirin EC 81 MG tablet Take 1 tablet (81 mg total) by mouth daily. 90 tablet 3  . docusate sodium (COLACE) 100 MG capsule Take 100 mg by mouth daily.    . furosemide (LASIX) 20 MG tablet Take 2 tablets (40 mg total) by mouth daily. 60 tablet 12  . isosorbide mononitrate (IMDUR) 60 MG 24 hr tablet Take 1 tablet (60 mg total) by mouth daily. 90 tablet 2  . Lactobacillus-Inulin (CULTURELLE DIGESTIVE HEALTH PO) Take 1 tablet by mouth daily.    Marland Kitchen levothyroxine (SYNTHROID, LEVOTHROID) 125 MCG tablet Take 125 mcg by mouth daily before breakfast.    . losartan-hydrochlorothiazide (HYZAAR) 100-25 MG per tablet Take 0.5 tablets by mouth daily.    . Melatonin 3 MG CAPS Take 1 capsule by mouth at bedtime as needed (sleep).     . Multiple Vitamin (MULTIVITAMIN WITH MINERALS) TABS tablet Take 1 tablet by mouth daily. Centrum 50+     . nitroGLYCERIN (NITROSTAT) 0.4 MG SL tablet Place 1 tablet (0.4 mg total) under the tongue every 5 (five) minutes as needed for chest pain. 25 tablet 2  . nystatin ointment (MYCOSTATIN) Apply 1 application topically at bedtime.   0  . pravastatin (PRAVACHOL) 20 MG tablet Take 40 mg by mouth daily.      No current facility-administered medications for this visit.    Allergies:   Atorvastatin; Latex; Shellfish allergy; Eggs or egg-derived products; Ranexa; Ciprofloxacin; Codeine; Contrast media; Gelnique; and Penicillins    Social History:  The patient  reports that she has never smoked. She has never used smokeless tobacco. She reports that she does not drink alcohol or use illicit drugs.   Family History:  The patient's family history includes Cancer in her mother; Heart disease in her brother, father, and sister.    ROS:  Please see the history of present illness.  Otherwise, review of systems positive for none.   All other systems are reviewed and negative.    PHYSICAL EXAM: VS:  BP 106/62 mmHg  Pulse 69  Resp 16  Ht 5\' 5"  (1.651 m)  Wt 203 lb (92.08 kg)  BMI 33.78 kg/m2 , BMI Body mass index is 33.78 kg/(m^2).  General: Alert, oriented x3, no distress Head: no evidence of trauma, PERRL, EOMI, no exophtalmos or lid lag, no myxedema, no xanthelasma; normal ears, nose and oropharynx Neck: normal jugular venous pulsations and no hepatojugular reflux; brisk carotid pulses without delay and no carotid bruits Chest: clear to auscultation, no signs of consolidation by percussion or palpation, normal fremitus, symmetrical and full respiratory excursions Cardiovascular: normal position and quality of the apical impulse, regular rhythm, normal first and second heart sounds, no  murmurs, rubs or gallops Abdomen: no tenderness or distention, no masses by palpation, no abnormal pulsatility or arterial bruits, normal bowel sounds, no hepatosplenomegaly Extremities: no clubbing, cyanosis;;   She has some residual ecchymosis over her right buttock ; the right thigh is slightly more swollen than the left; 2+ radial, ulnar and brachial pulses bilaterally; 2+ right femoral, posterior tibial and dorsalis pedis pulses; 2+ left femoral, posterior tibial and dorsalis pedis pulses; no subclavian or femoral bruits Neurological: grossly nonfocal Psych: euthymic mood, full affect    Recent Labs: 07/04/2014: Pro B Natriuretic peptide (BNP) 872.5* 08/25/2014: TSH 0.689 09/05/2014: ALT 20 01/28/2015: BUN 27*; Creatinine, Ser 1.33*; Hemoglobin 13.4; Platelets 153; Potassium 3.9; Sodium 137    Lipid Panel    Component Value Date/Time   CHOL 162 04/18/2013 0826   TRIG 140 04/18/2013 0826   HDL 42 04/18/2013 0826   CHOLHDL 3.9 04/18/2013 0826   VLDL 28 04/18/2013 0826   LDLCALC 92 04/18/2013 0826      Wt Readings from Last 3 Encounters:  06/16/15 203 lb (92.08 kg)  02/03/15 210 lb 9.6 oz (95.528 kg)  01/26/15 213 lb (96.616 kg)       ASSESSMENT AND PLAN:  1.  Right leg swelling and history of recent DVT , no longer on anticoagulation due to bleeding complications. We repeated the lower extremity venous Dopplers in the office today. There was no evidence of DVT in either leg. There was also no evidence of residual hematoma.  2.  Possible vasovagal events. Her blood pressure is quite low. Have asked her to decrease the valsartan/hydrochlorothiazide to a half tablet daily.  3.  CAD s/p LAD stent, moderate LMCA stenosis with recent coronary angiogram. Currently free of angina pectoris. Continue medical therapy.  4.  Hyperlipidemia on statin therapy. I'm a little skeptical that the current dose of pravastatin is sufficient. Will repeat labs.  She did not tolerate atorvastatin and cannot afford Crestor. Note report of abdominal MR images suggestive of cirrhosis, but no clinical or biochemical findings to support this  5. Left subclavian artery stenosis without evidence of subclavian  steal Blood pressure should always be checked in the right upper extremity.  Current medicines are reviewed at length with the patient today.  The patient does not have concerns regarding medicines.  The following changes have been made:  no change  Labs/ tests ordered today include:  LIPID profile   Patient Instructions  Your physician has requested that you have a lower extremity venous duplex. This test is an ultrasound of the veins in the legs or arms. It looks at venous blood flow that carries blood from the heart to the legs or arms.  Allow one hour for a Lower Venous exam. Allow thirty minutes for an Upper Venous exam. There are no restrictions or special instructions.  Dr. Sallyanne Kuster recommends that you schedule a follow-up appointment in: ONE YEAR           Signed, Sanda Klein, MD  06/17/2015 2:26 PM    Sanda Klein, MD, [facility] [phone] office [phone] pager

## 2015-06-16 NOTE — Patient Instructions (Signed)
Your physician has requested that you have a lower extremity venous duplex. This test is an ultrasound of the veins in the legs or arms. It looks at venous blood flow that carries blood from the heart to the legs or arms. Allow one hour for a Lower Venous exam. Allow thirty minutes for an Upper Venous exam. There are no restrictions or special instructions.  Dr. Sallyanne Kuster recommends that you schedule a follow-up appointment in: [place]

## 2015-06-28 LAB — COMPREHENSIVE METABOLIC PANEL
ALT: 30 U/L — ABNORMAL HIGH (ref 6–29)
AST: 34 U/L (ref 10–35)
Albumin: 4.1 g/dL (ref 3.6–5.1)
Alkaline Phosphatase: 69 U/L (ref 33–130)
BUN: 25 mg/dL (ref 7–25)
CO2: 32 mmol/L — ABNORMAL HIGH (ref 20–31)
Calcium: 10.6 mg/dL — ABNORMAL HIGH (ref 8.6–10.4)
Chloride: 94 mmol/L — ABNORMAL LOW (ref 98–110)
Creat: 1.06 mg/dL — ABNORMAL HIGH (ref 0.60–0.93)
Glucose, Bld: 114 mg/dL — ABNORMAL HIGH (ref 65–99)
Potassium: 3.4 mmol/L — ABNORMAL LOW (ref 3.5–5.3)
Sodium: 139 mmol/L (ref 135–146)
Total Bilirubin: 0.9 mg/dL (ref 0.2–1.2)
Total Protein: 6.7 g/dL (ref 6.1–8.1)

## 2015-06-28 LAB — LIPID PANEL
Cholesterol: 187 mg/dL (ref 125–200)
HDL: 45 mg/dL — ABNORMAL LOW (ref >=46)
LDL Cholesterol: 94 mg/dL (ref <130)
Total CHOL/HDL Ratio: 4.2 Ratio (ref <=5.0)
Triglycerides: 239 mg/dL — ABNORMAL HIGH (ref <150)
VLDL: 48 mg/dL — ABNORMAL HIGH (ref <30)

## 2015-06-30 ENCOUNTER — Telehealth: Payer: Self-pay | Admitting: *Deleted

## 2015-06-30 MED ORDER — POTASSIUM CHLORIDE ER 10 MEQ PO TBCR: 10.0000 meq | EXTENDED_RELEASE_TABLET | Freq: Every day | ORAL | Status: DC

## 2015-06-30 NOTE — Telephone Encounter (Signed)
Lab results called to patient.  Will start K+ 10 meq daily.  Rx sent to pharmacy.  Patient in agreement to decrease the sweets and starches in her diet. Voiced understanding.  Copy sent to Dr. Ernie Hew.

## 2015-06-30 NOTE — Telephone Encounter (Signed)
-----   Message from Sanda Klein, MD sent at 06/29/2015 10:13 AM EDT ----- Labs generally OK except low K. Start KCl 10 mEq daily please. TG a little high. Cut back on sweets and starches please. Copy Dr. Ernie Hew please

## 2015-09-18 DIAGNOSIS — Z9289 Personal history of other medical treatment: Secondary | ICD-10-CM

## 2015-09-18 HISTORY — DX: Personal history of other medical treatment: Z92.89

## 2015-09-30 DIAGNOSIS — J309 Allergic rhinitis, unspecified: Secondary | ICD-10-CM | POA: Diagnosis not present

## 2015-09-30 DIAGNOSIS — H6122 Impacted cerumen, left ear: Secondary | ICD-10-CM | POA: Diagnosis not present

## 2015-09-30 DIAGNOSIS — M8930 Hypertrophy of bone, unspecified site: Secondary | ICD-10-CM | POA: Diagnosis not present

## 2015-09-30 DIAGNOSIS — M2550 Pain in unspecified joint: Secondary | ICD-10-CM | POA: Diagnosis not present

## 2015-10-03 ENCOUNTER — Other Ambulatory Visit: Payer: Self-pay | Admitting: Family Medicine

## 2015-10-03 ENCOUNTER — Ambulatory Visit
Admission: RE | Admit: 2015-10-03 | Discharge: 2015-10-03 | Disposition: A | Discharge status: Home / Self Care | Payer: PPO | Source: Ambulatory Visit | Attending: Family Medicine | Admitting: Family Medicine

## 2015-10-03 DIAGNOSIS — R52 Pain, unspecified: Secondary | ICD-10-CM

## 2015-10-03 DIAGNOSIS — M25512 Pain in left shoulder: Secondary | ICD-10-CM | POA: Diagnosis not present

## 2015-10-04 ENCOUNTER — Telehealth: Payer: Self-pay | Admitting: Cardiovascular Disease

## 2015-10-04 NOTE — Telephone Encounter (Signed)
Explained to patient message to her from Dr, Sallyanne Kuster

## 2015-10-04 NOTE — Telephone Encounter (Signed)
Left arm BP is much lower than right L 95/66 R 128/77  HR 65 Concerned this morning of the difference in her left and right arm BP Denies any c/o numbness, tingling or discomfort on left side.  Normal color and warmth equal both hands

## 2015-10-04 NOTE — Telephone Encounter (Signed)
Please tell her we have known for quite some time that she has a narrowing in her left subclavian artery. Her BP has been lower on the left at her previous office checks. The real BP is the one recorded on the right. This is the same type of blockage that she has in her coronaries. It does not need to be "fixed" if she has no symptoms attributable to it. The same treatments she takes for her CAD (keep cholesterol low, aspirin, etc.) help prevent this worsening as well.

## 2015-10-14 DIAGNOSIS — J387 Other diseases of larynx: Secondary | ICD-10-CM | POA: Diagnosis not present

## 2015-10-14 DIAGNOSIS — R490 Dysphonia: Secondary | ICD-10-CM | POA: Diagnosis not present

## 2015-10-21 ENCOUNTER — Ambulatory Visit (INDEPENDENT_AMBULATORY_CARE_PROVIDER_SITE_OTHER): Payer: PPO | Admitting: Cardiovascular Disease

## 2015-10-21 ENCOUNTER — Encounter: Payer: Self-pay | Admitting: Cardiovascular Disease

## 2015-10-21 ENCOUNTER — Telehealth: Payer: Self-pay | Admitting: Cardiovascular Disease

## 2015-10-21 VITALS — BP 120/74 | HR 73 | Resp 16 | Ht 65.5 in | Wt 207.6 lb

## 2015-10-21 DIAGNOSIS — M47812 Spondylosis without myelopathy or radiculopathy, cervical region: Secondary | ICD-10-CM

## 2015-10-21 DIAGNOSIS — I10 Essential (primary) hypertension: Secondary | ICD-10-CM

## 2015-10-21 DIAGNOSIS — R079 Chest pain, unspecified: Secondary | ICD-10-CM

## 2015-10-21 DIAGNOSIS — E785 Hyperlipidemia, unspecified: Secondary | ICD-10-CM

## 2015-10-21 DIAGNOSIS — I110 Hypertensive heart disease with heart failure: Secondary | ICD-10-CM

## 2015-10-21 DIAGNOSIS — I2510 Atherosclerotic heart disease of native coronary artery without angina pectoris: Secondary | ICD-10-CM

## 2015-10-21 DIAGNOSIS — I872 Venous insufficiency (chronic) (peripheral): Secondary | ICD-10-CM

## 2015-10-21 DIAGNOSIS — I708 Atherosclerosis of other arteries: Secondary | ICD-10-CM | POA: Diagnosis not present

## 2015-10-21 DIAGNOSIS — I5032 Chronic diastolic (congestive) heart failure: Secondary | ICD-10-CM | POA: Diagnosis not present

## 2015-10-21 DIAGNOSIS — E669 Obesity, unspecified: Secondary | ICD-10-CM

## 2015-10-21 DIAGNOSIS — G4710 Hypersomnia, unspecified: Secondary | ICD-10-CM

## 2015-10-21 DIAGNOSIS — I771 Stricture of artery: Secondary | ICD-10-CM

## 2015-10-21 NOTE — Progress Notes (Signed)
Patient ID: Jill Preston, female   DOB: 08-09-1933, 82 y.o.   MRN: [id number]    Cardiology Office Note    Date:  10/23/2015   ID:  Jill Preston, DOB 12-26-1932, MRN [id number]  PCP:  Rachell Cipro, MD  Cardiologist:   Sanda Klein, MD   Chief Complaint  Patient presents with  . Follow-up    no current chest pain, has chest pressure, has pain in Left arm, has shortness of breath, has edema in ankles, has pain and cramping in legs, occassional lightheaded and dizziness    History of Present Illness:  Jill Preston is a 82 y.o. female with CAD, heart failure with preserved ejection fraction, history of lower extremity DVT, hypertension, hyperlipidemia and previous cervical spine surgery. She presents today with a variety of complaints and looks quite unhappy.  She has a lot of discomfort in both shoulders, occipital headaches, shooting neuralgic pain in her left arm and recently has developed severe numbness in her left arm. The pain radiates down her back. About 2 weeks ago she expressed chest pressure while walking in [place] store, she has not had any recurrence since then. She describes waking up feeling tired and has daytime hypersomnolence. She snores. Her husband sleeps in a separate room so it is unclear whether she has had apnea.  She has a long history of coronary disease. She underwent proximal LAD bare-metal stenting and balloon angioplasty of the mid LAD in 2013. She has had persistent ischemia in the territory of the diagonal artery on nuclear stress test performed over the last few years. Coronary angiography performed May 2016 showed a widely patent LAD stent, 60% ostial circumflex stenosis, 20-30 percent right coronary artery stenosis. There was concern about possible left coronary artery stenosis but fractional flow reserve was normal (0.96 at baseline, 0.91 during intravenous adenosine infusion). She had good anginal response to Ranexa but developed severe  constipation and could not tolerate the medication. She has been intolerant to beta blockers due to bradycardia. She has preserved left ventricular systolic function but has had episodes of acute exacerbation of diastolic heart failure attributed to hypertensive heart disease. December 2015, she was critically ill with an acute left iliofemoral DVT with anticoagulation complicated by retroperitoneal hematoma and hemorrhagic shock, requiring placement of an inferior vena cava filter. The filter was removed in June 2016.  Past Medical History  Diagnosis Date  . Chronic venous insufficiency     LEA VENOUS, 10/17/2011 - mild reflux in bilateral common femoral veins  . Hyperlipidemia   . Coronary artery disease     a. s/p PCI/BMS to prox LAD and balloon angioplasty to mLAD with suboptimal result in 2000. b. Abnl nuc 2012, cath 09/2011 - showed that the overall territory of potential ischemia was small and attributable to a moderately diseased small second diagonal artery. Med rx.  . Hypertension   . Chronic diastolic CHF (congestive heart failure) ([place])   . Hypothyroidism   . Hypertensive heart disease   . CKD (chronic kidney disease), stage III   . Obesity   . Arthritis   . Stroke [facility])     pt. states she had "light stroke" in sept. 1980    Past Surgical History  Procedure Laterality Date  . Cardiac catheterization Left 09/25/2011    Medical management  . Cardiac catheterization Left 03/25/2001    Normal LV function, LAD residual narrowing of less than 10%, normal ramus intermediate, circumflex, and RCA,   . Cardiac catheterization  09/04/1999  LAD, 3x32mm Tetra stent resulting in a reduction of the 80% stenosis to 0% residual  . Coronary stent placement      LAD  . Bladder surgery  2010    WITH MESH   . Cholecystectomy    . Abdominal hysterectomy    . Rotator cuff repair Right   . Left heart catheterization with coronary angiogram N/A 09/25/2011    Procedure: LEFT HEART CATHETERIZATION WITH  CORONARY ANGIOGRAM;  Surgeon: Sanda Klein, MD;  Location: [facility] CATH [facility];  Service: Cardiovascular;  Laterality: N/A;  . Cardiac catheterization N/A 01/26/2015    Procedure: Right/Left Heart Cath and Coronary Angiography;  Surgeon: Troy Sine, MD;  Location: [place] CV [facility];  Service: Cardiovascular;  Laterality: N/A;  . Cardiac catheterization N/A 01/27/2015    Procedure: Intravascular Pressure Wire/FFR Study;  Surgeon: Burnell Blanks, MD;  Location: [place] CV LAB;  Service: Cardiovascular;  Laterality: N/A;  . Cardiac catheterization N/A 01/27/2015    Procedure: Right Heart Cath;  Surgeon: Burnell Blanks, MD;  Location: [facility] CV LAB;  Service: Cardiovascular;  Laterality: N/A;    Outpatient Prescriptions Prior to Visit  Medication Sig Dispense Refill  . acetaminophen (TYLENOL) 650 MG CR tablet Take 1,300 mg by mouth every 8 (eight) hours as needed for pain.     Marland Kitchen aspirin EC 81 MG tablet Take 1 tablet (81 mg total) by mouth daily. 90 tablet 3  . docusate sodium (COLACE) 100 MG capsule Take 100 mg by mouth daily.    . furosemide (LASIX) 20 MG tablet Take 2 tablets (40 mg total) by mouth daily. 60 tablet 12  . isosorbide mononitrate (IMDUR) 60 MG 24 hr tablet Take 1 tablet (60 mg total) by mouth daily. 90 tablet 2  . Lactobacillus-Inulin (CULTURELLE DIGESTIVE HEALTH PO) Take 1 tablet by mouth daily.    Marland Kitchen levothyroxine (SYNTHROID, LEVOTHROID) 125 MCG tablet Take 125 mcg by mouth daily before breakfast.    . Melatonin 3 MG CAPS Take 1 capsule by mouth at bedtime as needed (sleep).     . Multiple Vitamin (MULTIVITAMIN WITH MINERALS) TABS tablet Take 1 tablet by mouth daily. Centrum 50+    . nitroGLYCERIN (NITROSTAT) 0.4 MG SL tablet Place 1 tablet (0.4 mg total) under the tongue every 5 (five) minutes as needed for chest pain. 25 tablet 2  . nystatin ointment (MYCOSTATIN) Apply 1 application topically at bedtime.   0  . potassium chloride (K-DUR) 10 MEQ tablet Take 1  tablet (10 mEq total) by mouth daily. 90 tablet 3  . losartan-hydrochlorothiazide (HYZAAR) 100-25 MG per tablet Take 0.5 tablets by mouth daily.    . pravastatin (PRAVACHOL) 20 MG tablet Take 40 mg by mouth daily.      No facility-administered medications prior to visit.     Allergies:   Atorvastatin; Latex; Shellfish allergy; Eggs or egg-derived products; Oxybutynin chloride; Ranexa; Ciprofloxacin; Codeine; Contrast media; Gelnique; and Penicillins   Social History   Social History  . Marital Status: Married    Spouse Name: N/A  . Number of Children: N/A  . Years of Education: N/A   Social History Main Topics  . Smoking status: Never Smoker   . Smokeless tobacco: Never Used  . Alcohol Use: No  . Drug Use: No  . Sexual Activity: Not Asked   Other Topics Concern  . None   Social History Narrative     Family History:  The patient's family history includes Cancer in her mother; Heart disease  in her brother, father, and sister.   ROS:   Please see the history of present illness.    ROS All other systems reviewed and are negative.   PHYSICAL EXAM:   VS:  BP 120/74 mmHg  Pulse 73  Ht 5' 5.5" (1.664 m)  Wt 94.15 kg (207 lb 9 oz)  BMI 34.00 kg/m2   GEN: Well nourished, well developed, in no acute distress HEENT: normal Neck: no JVD, carotid bruits, or masses Cardiac: RRR; no murmurs, rubs, or gallops, 1+ metrical ankle edema  Respiratory:  clear to auscultation bilaterally, normal work of breathing GI: soft, nontender, nondistended, + BS MS: no deformity or atrophy Skin: warm and dry, no rash Neuro:  Alert and Oriented x 3, Strength and sensation are intact Psych: euthymic mood, full affect  Wt Readings from Last 3 Encounters:  10/21/15 94.15 kg (207 lb 9 oz)  06/16/15 92.08 kg (203 lb)  02/03/15 95.528 kg (210 lb 9.6 oz)      Studies/Labs Reviewed:   EKG:  EKG is ordered today.  The ekg ordered today demonstrates sinus rhythm, minor nonspecific ST and T-wave  abnormalities in the anterior leads that are just slightly more obvious than on previous tracings  Recent Labs: 01/28/2015: Hemoglobin 13.4; Platelets 153 06/27/2015: ALT 30*; BUN 25; Creat 1.06*; Potassium 3.4*; Sodium 139   Lipid Panel    Component Value Date/Time   CHOL 187 06/27/2015 1336   TRIG 239* 06/27/2015 1336   HDL 45* 06/27/2015 1336   CHOLHDL 4.2 06/27/2015 1336   VLDL 48* 06/27/2015 1336   LDLCALC 94 06/27/2015 1336     ASSESSMENT:    1. Chest pain, unspecified chest pain type   2. CAD in native artery   3. Chronic diastolic CHF (congestive heart failure) (H[facility])   4. Subclavian artery stenosis, left   5. Hypertensive heart disease with heart failure ([place])   6. Essential hypertension   7. Obesity   8. Dyslipidemia   9. Chronic venous insufficiency   10. Cervical spine degeneration   11. Hypersomnolence      PLAN:  In order of problems listed above:  1. Chest pain: Still the pain syndrome-she describes sounds highly compatible with cervical spine radicular compression, maybe even direct cord impingement. Particularly word by her complaints of numbness in her left arm and I suggested that she have a follow-up visit with her neurosurgeon. The episode of chest discomfort that occurred while walking in a store 2 weeks ago might have been angina pectoris, but this appears to be an infrequent type of chest discomfort. We'll recheck a nuclear perfusion study 2. CAD: She has a known significant stenosis in the diagonal artery with ischemia in the corresponding territory of the anterior wall. This is not amenable to revascularization. She seems to have CCS class II angina pectoris. Unfortunately she did not tolerate beta blockers due to bradycardia and did not tolerated Ranexa due to constipation. She is on long-acting nitrates. Her blood pressure would permit additional treatment with calcium channel blockers are higher nitrate dose, but I don't think her current pain  complaints are predominantly due to coronary insufficiency. 3. CHF: Seems to be well compensated. NYHA functional class I-II, are to assess since she is quite sedentary. Minimal signs of hypervolemia on exam. She takes a relatively low dose of loop diuretic. Her weight is not substantially different from last September and last May 4. The diagnosis of subclavian artery stenosis was not confirmed on her most recent ultrasound study  from November 2015 5. Blood pressure well controlled 6. Blood pressure well controlled 7. Moderate obesity, weight loss encouraged. 8. HLP: She was intolerant to atorvastatin. Current LDL level is acceptable, although ideally LDL less than 70. 9. Her ankle edema is just as likely to be secondary to venous insufficiency as it is to be secondary to heart failure. I would not increase her dose of diuretic. 10. I think her back pain, neck pain, neuralgic left arm pain and left arm numbness may be signs of cervical spine impingement. Strongly encourage her to make a follow-up with her neurosurgeon, Dr. Arnoldo Morale. If she should develop muscle weakness, she should seek emergency medical attention. 11. She has several features to suggest obstructive sleep apnea. I think she needs a sleep study. She is reluctant to do this at this time. We'll first try to get to the bottom of her chest discomfort and possible cervical spine problems    Medication Adjustments/Labs and Tests Ordered: Current medicines are reviewed at length with the patient today.  Concerns regarding medicines are outlined above.  Medication changes, Labs and Tests ordered today are listed in the Patient Instructions below. Patient Instructions  Your physician has requested that you have a lexiscan myoview. For further information please visit [email or web address]. Please follow instruction sheet, as given.  Dr. Sallyanne Kuster recommends that you schedule a follow-up appointment in: FOLLOWING TESTING         Signed, Sanda Klein, MD  10/23/2015 11:08 AM    [facility] [address], [place], [place]  [address] Phone: [phone]; Fax: [phone]

## 2015-10-21 NOTE — Telephone Encounter (Signed)
Returned pt call. She complains of left shoulder, neck, back, and arm pain. Persistent for ~ 1 month. She has also stated CP for about same duration.  States that more recently she has had issues of increased sleepiness - falls asleep during day now when sitting down on couch, which is unusual for her.  Noted same-day blocks available. Added to Dr. Victorino December schedule for today.

## 2015-10-21 NOTE — Patient Instructions (Signed)
Your physician has requested that you have a lexiscan myoview. For further information please visit [email or web address]. Please follow instruction sheet, as given.  Dr. Sallyanne Kuster recommends that you schedule a follow-up appointment in: FOLLOWING TESTING

## 2015-10-21 NOTE — Telephone Encounter (Signed)
New Message  Pt called states that she has numbness in left arm, neck hurts and shoulder hurts and headaches.   When this happens she stops and rest. When it gets better she can get up and do a little bit more and then the symptoms return.   No chest pain. Just Chest pressure.  Pt c/o of Chest Pressure: 1. Are you having CP right now? No because she isnt doing anuthong 2. Are you experiencing any other symptoms (ex. SOB, nausea, vomiting, sweating)?  3. How long have you been experiencing Chest Pressure? It started about 3-4 weeks ago but she decided to just slow up. But the problem is the pressure is persistent  4. Is your CP continuous or coming and going? Coming and going. When she isnt doing anything. But when she gets goin it wipes her out.

## 2015-10-21 NOTE — Telephone Encounter (Signed)
Pain is convincingly musculoskeletal, not cardiac. Sounds like she may have sleep apnea. Recommend split night sleep study. Will discuss today.

## 2015-10-23 ENCOUNTER — Encounter: Payer: Self-pay | Admitting: Cardiovascular Disease

## 2015-10-23 DIAGNOSIS — G4710 Hypersomnia, unspecified: Secondary | ICD-10-CM | POA: Insufficient documentation

## 2015-10-23 DIAGNOSIS — M47812 Spondylosis without myelopathy or radiculopathy, cervical region: Secondary | ICD-10-CM | POA: Insufficient documentation

## 2015-10-28 ENCOUNTER — Telehealth (HOSPITAL_COMMUNITY): Payer: Self-pay

## 2015-10-28 NOTE — Telephone Encounter (Signed)
Encounter complete. 

## 2015-10-31 DIAGNOSIS — R739 Hyperglycemia, unspecified: Secondary | ICD-10-CM | POA: Diagnosis not present

## 2015-10-31 DIAGNOSIS — E559 Vitamin D deficiency, unspecified: Secondary | ICD-10-CM | POA: Diagnosis not present

## 2015-10-31 DIAGNOSIS — Z7901 Long term (current) use of anticoagulants: Secondary | ICD-10-CM | POA: Diagnosis not present

## 2015-11-01 DIAGNOSIS — N183 Chronic kidney disease, stage 3 (moderate): Secondary | ICD-10-CM | POA: Diagnosis not present

## 2015-11-01 DIAGNOSIS — I509 Heart failure, unspecified: Secondary | ICD-10-CM | POA: Diagnosis not present

## 2015-11-01 DIAGNOSIS — R739 Hyperglycemia, unspecified: Secondary | ICD-10-CM | POA: Diagnosis not present

## 2015-11-02 ENCOUNTER — Ambulatory Visit (HOSPITAL_COMMUNITY)
Admission: RE | Admit: 2015-11-02 | Discharge: 2015-11-02 | Disposition: A | Discharge status: Home / Self Care | Payer: PPO | Source: Ambulatory Visit | Attending: Cardiovascular Disease | Admitting: Cardiovascular Disease

## 2015-11-02 DIAGNOSIS — R0602 Shortness of breath: Secondary | ICD-10-CM | POA: Insufficient documentation

## 2015-11-02 DIAGNOSIS — R079 Chest pain, unspecified: Secondary | ICD-10-CM | POA: Diagnosis not present

## 2015-11-02 DIAGNOSIS — E663 Overweight: Secondary | ICD-10-CM | POA: Insufficient documentation

## 2015-11-02 DIAGNOSIS — Z6833 Body mass index (BMI) 33.0-33.9, adult: Secondary | ICD-10-CM | POA: Insufficient documentation

## 2015-11-02 DIAGNOSIS — R5383 Other fatigue: Secondary | ICD-10-CM | POA: Diagnosis not present

## 2015-11-02 DIAGNOSIS — I10 Essential (primary) hypertension: Secondary | ICD-10-CM | POA: Diagnosis not present

## 2015-11-02 DIAGNOSIS — Z8249 Family history of ischemic heart disease and other diseases of the circulatory system: Secondary | ICD-10-CM | POA: Insufficient documentation

## 2015-11-02 DIAGNOSIS — R42 Dizziness and giddiness: Secondary | ICD-10-CM | POA: Diagnosis not present

## 2015-11-02 LAB — MYOCARDIAL PERFUSION IMAGING
LV dias vol: 77 mL
LV sys vol: 26 mL
Peak HR: 83 {beats}/min
Rest HR: 66 {beats}/min
SDS: 0
SRS: 1
SSS: 1
TID: 1.08

## 2015-11-02 MED ORDER — TECHNETIUM TC 99M SESTAMIBI GENERIC - CARDIOLITE
30.4000 | Freq: Once | INTRAVENOUS | Status: AC | PRN
  Administered 2015-11-02: 30.4 via INTRAVENOUS

## 2015-11-02 MED ORDER — AMINOPHYLLINE 25 MG/ML IV SOLN
75.0000 mg | Freq: Once | INTRAVENOUS | Status: AC
  Administered 2015-11-02: 75 mg via INTRAVENOUS

## 2015-11-02 MED ORDER — REGADENOSON 0.4 MG/5ML IV SOLN
0.4000 mg | Freq: Once | INTRAVENOUS | Status: AC
  Administered 2015-11-02: 0.4 mg via INTRAVENOUS

## 2015-11-02 MED ORDER — TECHNETIUM TC 99M SESTAMIBI GENERIC - CARDIOLITE
9.7000 | Freq: Once | INTRAVENOUS | Status: AC | PRN
  Administered 2015-11-02: 9.7 via INTRAVENOUS

## 2015-11-08 ENCOUNTER — Telehealth: Payer: Self-pay | Admitting: *Deleted

## 2015-11-08 DIAGNOSIS — Z79899 Other long term (current) drug therapy: Secondary | ICD-10-CM

## 2015-11-08 NOTE — Telephone Encounter (Signed)
Dr. Loletha Grayer reviewed labs from Dr. Ernie Hew.  Hold Lasix  And recheck BMP in one week. Patient voiced understanding.  Order placed.  Lab to be done at [facility] on the first floor.

## 2015-11-10 ENCOUNTER — Telehealth: Payer: Self-pay | Admitting: Cardiovascular Disease

## 2015-11-10 DIAGNOSIS — Z79899 Other long term (current) drug therapy: Secondary | ICD-10-CM

## 2015-11-10 DIAGNOSIS — I509 Heart failure, unspecified: Secondary | ICD-10-CM

## 2015-11-10 NOTE — Telephone Encounter (Signed)
New message      Pt c/o of weakness.  She has no energy.  She is also having a bad headache.  She states she has gained fluid--214.5 lbs as of this am.  She is no longer on the fluid pill.  Please advise.

## 2015-11-10 NOTE — Telephone Encounter (Signed)
Spoke with pt, she is in bed and crying on the phone with me. She was taken off the furosemide on Tuesday because her creatine increased per her report. On Sunday her weight was 207 lb, then Tuesday 212 lb, yesterday 213 lb and today 214lb. She reports swelling in right leg, knee, hands and eyes. Her SOB is no worse than usual and she feels better when lying down. Her bp today has been 180/84, 169/84 and 170/87 with pulse 67 to 70. Advised pt to take a furosemide today and then will send this note to dr croitoru for his advise. Pt agreed with this plan.

## 2015-11-14 NOTE — Telephone Encounter (Signed)
Could you please follow up and see if she is any better?

## 2015-11-14 NOTE — Telephone Encounter (Signed)
Thank you. Could she please check BMET and BNP on Thursday?

## 2015-11-14 NOTE — Telephone Encounter (Signed)
BNP ordered alongside BMET.   Pt informed of recommendations. Appt confirmed for Friday.

## 2015-11-14 NOTE — Telephone Encounter (Signed)
Called pt. She reports she feels marginally improved, still feels swollen, but she is not in bed as much. Notes knees feel full of fluid. She denies shortness of breath at this time but does state moreso when lying down.  She is taking 20mg  lasix a day currently. Advised to return to 40mg  daily dose x next 2-3 days. Will return call for further instructions. Pending BMET on Wed or Thurs. She sees Dr. Sallyanne Kuster on Friday.  Will see if anything further advised at this time.

## 2015-11-15 ENCOUNTER — Telehealth: Payer: Self-pay | Admitting: Cardiovascular Disease

## 2015-11-15 NOTE — Telephone Encounter (Signed)
Pt had some variable readings on her home BP cuff today. No symptoms, pressures varied wildly between readings over a short period of time.  Gave reassurance, advised BP cuff may not be configured or she may have had inaccurate readings - we could compare to office cuff when she comes in on Friday for return OV. Pt voiced understanding & thanks. Aware to call if needs in interim.

## 2015-11-15 NOTE — Telephone Encounter (Signed)
Please call,so she can give you an update on her blood pressure.

## 2015-11-17 DIAGNOSIS — I509 Heart failure, unspecified: Secondary | ICD-10-CM | POA: Diagnosis not present

## 2015-11-17 DIAGNOSIS — Z79899 Other long term (current) drug therapy: Secondary | ICD-10-CM | POA: Diagnosis not present

## 2015-11-18 ENCOUNTER — Ambulatory Visit (INDEPENDENT_AMBULATORY_CARE_PROVIDER_SITE_OTHER): Payer: PPO | Admitting: Cardiovascular Disease

## 2015-11-18 ENCOUNTER — Encounter: Payer: Self-pay | Admitting: Cardiovascular Disease

## 2015-11-18 VITALS — BP 156/78 | HR 58 | Ht 65.5 in | Wt 209.3 lb

## 2015-11-18 DIAGNOSIS — I872 Venous insufficiency (chronic) (peripheral): Secondary | ICD-10-CM

## 2015-11-18 DIAGNOSIS — I2510 Atherosclerotic heart disease of native coronary artery without angina pectoris: Secondary | ICD-10-CM

## 2015-11-18 DIAGNOSIS — I5033 Acute on chronic diastolic (congestive) heart failure: Secondary | ICD-10-CM | POA: Diagnosis not present

## 2015-11-18 DIAGNOSIS — E785 Hyperlipidemia, unspecified: Secondary | ICD-10-CM

## 2015-11-18 DIAGNOSIS — I10 Essential (primary) hypertension: Secondary | ICD-10-CM | POA: Diagnosis not present

## 2015-11-18 DIAGNOSIS — E669 Obesity, unspecified: Secondary | ICD-10-CM

## 2015-11-18 LAB — BASIC METABOLIC PANEL
BUN: 19 mg/dL (ref 7–25)
CO2: 29 mmol/L (ref 20–31)
Calcium: 9.5 mg/dL (ref 8.6–10.4)
Chloride: 101 mmol/L (ref 98–110)
Creat: 1.25 mg/dL — ABNORMAL HIGH (ref 0.60–0.88)
Glucose, Bld: 95 mg/dL (ref 65–99)
Potassium: 4.4 mmol/L (ref 3.5–5.3)
Sodium: 140 mmol/L (ref 135–146)

## 2015-11-18 LAB — BRAIN NATRIURETIC PEPTIDE: Brain Natriuretic Peptide: 127.3 pg/mL — ABNORMAL HIGH (ref <100)

## 2015-11-18 NOTE — Progress Notes (Signed)
Patient ID: Jill Preston, female   DOB: Jun 13, 1933, 82 y.o.   MRN: [id number]    Cardiology Office Note    Date:  11/20/2015   ID:  Jill Preston, DOB 02/19/1933, MRN [id number]  PCP:  Rachell Cipro, MD  Cardiologist:   Sanda Klein, MD   Chief Complaint  Patient presents with  . Follow-up    Follow-up Lexiscan.  Occas. SOB with activity.  Bilateral ankle edema. patients BP monitor here in the office 197/109.     History of Present Illness:  Jill Preston is a 82 y.o. female returning in follow-up after developing severe dyspnea. Her diuretic was curtailed when she had evidence of worsening renal function on routine laboratory tests. Within a matter of days she has developed severe dyspnea with minimal exertion and even dyspnea at rest. She restarted taking her diuretic a few days ago and is already feeling better. She denies angina pectoris. She has moderate ankle edema bilaterally in a symmetrical pattern. She denies cough, hemoptysis, syncope, angina or pleuritic chest pain, focal neurological deficits, abdominal pain, change in bowel pattern or bleeding problems.  Her nuclear stress test performed just 2 weeks ago was normal. LVEF was calculated at 66%.  Her wrist blood pressure monitor appears to be very inaccurate, overestimating her systolic and diastolic blood pressure by about 40 mmHg.  As far as I can tell, "optimal" fluid balance is at a "dry weight" of around 204-205 pounds.  She has a long history of coronary disease. She underwent proximal LAD bare-metal stenting and balloon angioplasty of the mid LAD in 2013. She has had persistent ischemia in the territory of the diagonal artery on nuclear stress test performed over the last few years. Coronary angiography performed May 2016 showed a widely patent LAD stent, 60% ostial circumflex stenosis, 20-30 percent right coronary artery stenosis. There was concern about possible left coronary artery stenosis but fractional  flow reserve was normal (0.96 at baseline, 0.91 during intravenous adenosine infusion). She had good anginal response to Ranexa but developed severe constipation and could not tolerate the medication. She has been intolerant to beta blockers due to bradycardia. She has preserved left ventricular systolic function but has had episodes of acute exacerbation of diastolic heart failure attributed to hypertensive heart disease. December 2015, she was critically ill with an acute left iliofemoral DVT with anticoagulation complicated by retroperitoneal hematoma and hemorrhagic shock, requiring placement of an inferior vena cava filter. The filter was removed in June 2016.  Past Medical History  Diagnosis Date  . Chronic venous insufficiency     LEA VENOUS, 10/17/2011 - mild reflux in bilateral common femoral veins  . Hyperlipidemia   . Coronary artery disease     a. s/p PCI/BMS to prox LAD and balloon angioplasty to mLAD with suboptimal result in 2000. b. Abnl nuc 2012, cath 09/2011 - showed that the overall territory of potential ischemia was small and attributable to a moderately diseased small second diagonal artery. Med rx.  . Hypertension   . Chronic diastolic CHF (congestive heart failure) ([place])   . Hypothyroidism   . Hypertensive heart disease   . CKD (chronic kidney disease), stage III   . Obesity   . Arthritis   . Stroke [facility])     pt. states she had "light stroke" in sept. 1980    Past Surgical History  Procedure Laterality Date  . Cardiac catheterization Left 09/25/2011    Medical management  . Cardiac catheterization Left 03/25/2001    Normal  LV function, LAD residual narrowing of less than 10%, normal ramus intermediate, circumflex, and RCA,   . Cardiac catheterization  09/04/1999    LAD, 3x103mm Tetra stent resulting in a reduction of the 80% stenosis to 0% residual  . Coronary stent placement      LAD  . Bladder surgery  2010    WITH MESH   . Cholecystectomy    . Abdominal  hysterectomy    . Rotator cuff repair Right   . Left heart catheterization with coronary angiogram N/A 09/25/2011    Procedure: LEFT HEART CATHETERIZATION WITH CORONARY ANGIOGRAM;  Surgeon: Sanda Klein, MD;  Location: [facility] CATH LAB;  Service: Cardiovascular;  Laterality: N/A;  . Cardiac catheterization N/A 01/26/2015    Procedure: Right/Left Heart Cath and Coronary Angiography;  Surgeon: Troy Sine, MD;  Location: [place] CV LAB;  Service: Cardiovascular;  Laterality: N/A;  . Cardiac catheterization N/A 01/27/2015    Procedure: Intravascular Pressure Wire/FFR Study;  Surgeon: Burnell Blanks, MD;  Location: [place] CV LAB;  Service: Cardiovascular;  Laterality: N/A;  . Cardiac catheterization N/A 01/27/2015    Procedure: Right Heart Cath;  Surgeon: Burnell Blanks, MD;  Location: [place] CV LAB;  Service: Cardiovascular;  Laterality: N/A;    Outpatient Prescriptions Prior to Visit  Medication Sig Dispense Refill  . acetaminophen (TYLENOL) 650 MG CR tablet Take 1,300 mg by mouth every 8 (eight) hours as needed for pain.     Marland Kitchen aspirin EC 81 MG tablet Take 1 tablet (81 mg total) by mouth daily. 90 tablet 3  . docusate sodium (COLACE) 100 MG capsule Take 100 mg by mouth daily.    . furosemide (LASIX) 20 MG tablet Take 2 tablets (40 mg total) by mouth daily. 60 tablet 12  . isosorbide mononitrate (IMDUR) 60 MG 24 hr tablet Take 1 tablet (60 mg total) by mouth daily. 90 tablet 2  . Lactobacillus-Inulin (CULTURELLE DIGESTIVE HEALTH PO) Take 1 tablet by mouth daily.    Marland Kitchen levothyroxine (SYNTHROID, LEVOTHROID) 125 MCG tablet Take 125 mcg by mouth daily before breakfast.    . Melatonin 3 MG CAPS Take 1 capsule by mouth at bedtime as needed (sleep).     . Multiple Vitamin (MULTIVITAMIN WITH MINERALS) TABS tablet Take 1 tablet by mouth daily. Centrum 50+    . nitroGLYCERIN (NITROSTAT) 0.4 MG SL tablet Place 1 tablet (0.4 mg total) under the tongue every 5 (five) minutes as needed  for chest pain. 25 tablet 2  . nystatin ointment (MYCOSTATIN) Apply 1 application topically at bedtime.   0  . potassium chloride (K-DUR) 10 MEQ tablet Take 1 tablet (10 mEq total) by mouth daily. 90 tablet 3  . pravastatin (PRAVACHOL) 80 MG tablet Take 80 mg by mouth at bedtime. Take 1 tab daily  0   No facility-administered medications prior to visit.     Allergies:   Atorvastatin; Latex; Shellfish allergy; Eggs or egg-derived products; Oxybutynin chloride; Ranexa; Ciprofloxacin; Codeine; Contrast media; Gelnique; and Penicillins   Social History   Social History  . Marital Status: Married    Spouse Name: N/A  . Number of Children: N/A  . Years of Education: N/A   Social History Main Topics  . Smoking status: Never Smoker   . Smokeless tobacco: Never Used  . Alcohol Use: No  . Drug Use: No  . Sexual Activity: Not Asked   Other Topics Concern  . None   Social History Narrative     Family  History:  The patient's family history includes Cancer in her mother; Heart disease in her brother, father, and sister.   ROS:   Please see the history of present illness.    ROS All other systems reviewed and are negative.   PHYSICAL EXAM:   VS:  BP 156/78 mmHg  Pulse 58  Ht 5' 5.5" (1.664 m)  Wt 94.938 kg (209 lb 4.8 oz)  BMI 34.29 kg/m2   GEN: Well nourished, well developed, in no acute distress HEENT: normal Neck: no JVD, carotid bruits, or masses Cardiac: RRR; no murmurs, rubs, or gallops, 1-2 plus edema  Respiratory:  clear to auscultation bilaterally, normal work of breathing GI: soft, nontender, nondistended, + BS MS: no deformity or atrophy Skin: warm and dry, no rash Neuro:  Alert and Oriented x 3, Strength and sensation are intact Psych: euthymic mood, full affect  Wt Readings from Last 3 Encounters:  11/18/15 94.938 kg (209 lb 4.8 oz)  11/02/15 93.895 kg (207 lb)  10/21/15 94.15 kg (207 lb 9 oz)      Studies/Labs Reviewed:   EKG:  EKG is ordered today.     Recent Labs: 01/28/2015: Hemoglobin 13.4; Platelets 153 06/27/2015: ALT 30* 11/17/2015: BUN 19; Creat 1.25*; Potassium 4.4; Sodium 140   Lipid Panel    Component Value Date/Time   CHOL 187 06/27/2015 1336   TRIG 239* 06/27/2015 1336   HDL 45* 06/27/2015 1336   CHOLHDL 4.2 06/27/2015 1336   VLDL 48* 06/27/2015 1336   LDLCALC 94 06/27/2015 1336     ASSESSMENT:    1. Acute on chronic diastolic HF (heart failure) ([place])   2. CAD in native artery   3. Essential hypertension   4. Chronic venous insufficiency   5. Dyslipidemia   6. Obesity      PLAN:  In order of problems listed above:  1. CHF: Clearly, Mikaiya has a rather narrow margin of compensation between renal insufficiency and symptomatic left heart failure. We'll try to see if a tailored diuretic prescription will work for her. She will not take diuretics if her weight is under 205 pounds but will double the dose of diuretic when she exceeds 210 pounds. We will have to tolerate some degree of worsening renal function to prevent severe symptomatic heart failure 2. CAD: Currently appears to be free of angina. Limitations in use of antianginal medications described above. She has an occasional headache that does not seem pulsatile but rather positional and related to cervical spine disease 3. HTN: She needs a new blood pressure monitor. I think we have been adjusting medications based on faulty data. Advised that she get a automatic blood pressure monitor with a large arm cuff 4. Peripheral venous insufficiency explains some of her ankle edema. I think her weight is a better parameter to use in deciding on diuretic dose adjustment 5. Hyperlipidemia: Satisfactory lipid profile.  6. Weight loss recommended    Medication Adjustments/Labs and Tests Ordered: Current medicines are reviewed at length with the patient today.  Concerns regarding medicines are outlined above.  Medication changes, Labs and Tests ordered today are listed  in the Patient Instructions below. Patient Instructions  IF YOUR WEIGHT IS 205 OR LESS  ------   NO FUROSEMIDE OR POTASSIUM  IF YOUR WEIGHT IS 205 - 210 ------  TAKE 1 FUROSEMIDE AND 1 POTASSIUM  IF YOUR WEIGHT IS 210 OR HIGH ------  TAKE 2 FUROSEMIDE AND 2 POTASSIUM    Dr. Sallyanne Kuster recommends that you schedule a follow-up  appointment in: 8-9 Hazle Nordmann, MD  11/20/2015 12:30 PM    [facility] [place], [place], [place]  [address] Phone: [phone]; Fax: [phone]

## 2015-11-18 NOTE — Patient Instructions (Signed)
IF YOUR WEIGHT IS 205 OR LESS  ------   NO FUROSEMIDE OR POTASSIUM  IF YOUR WEIGHT IS 205 - 210 ------  TAKE 1 FUROSEMIDE AND 1 POTASSIUM  IF YOUR WEIGHT IS 210 OR HIGH ------  TAKE 2 FUROSEMIDE AND 2 POTASSIUM    Dr. Sallyanne Kuster recommends that you schedule a follow-up appointment in: 8-9 WEEKS

## 2015-11-22 ENCOUNTER — Other Ambulatory Visit: Payer: Self-pay | Admitting: Physician Assistant

## 2015-11-22 NOTE — Telephone Encounter (Signed)
REFILL 

## 2015-11-23 DIAGNOSIS — I10 Essential (primary) hypertension: Secondary | ICD-10-CM | POA: Diagnosis not present

## 2015-11-29 ENCOUNTER — Other Ambulatory Visit: Payer: Self-pay | Admitting: Family Medicine

## 2015-11-29 DIAGNOSIS — M5416 Radiculopathy, lumbar region: Secondary | ICD-10-CM

## 2015-12-01 DIAGNOSIS — Z85828 Personal history of other malignant neoplasm of skin: Secondary | ICD-10-CM | POA: Diagnosis not present

## 2015-12-01 DIAGNOSIS — L821 Other seborrheic keratosis: Secondary | ICD-10-CM | POA: Diagnosis not present

## 2015-12-01 DIAGNOSIS — D485 Neoplasm of uncertain behavior of skin: Secondary | ICD-10-CM | POA: Diagnosis not present

## 2015-12-07 ENCOUNTER — Ambulatory Visit
Admission: RE | Admit: 2015-12-07 | Discharge: 2015-12-07 | Disposition: A | Discharge status: Home / Self Care | Payer: PPO | Source: Ambulatory Visit | Attending: Family Medicine | Admitting: Family Medicine

## 2015-12-07 DIAGNOSIS — M5126 Other intervertebral disc displacement, lumbar region: Secondary | ICD-10-CM | POA: Diagnosis not present

## 2015-12-07 DIAGNOSIS — M5416 Radiculopathy, lumbar region: Secondary | ICD-10-CM

## 2015-12-14 DIAGNOSIS — M545 Low back pain: Secondary | ICD-10-CM | POA: Diagnosis not present

## 2015-12-14 DIAGNOSIS — I10 Essential (primary) hypertension: Secondary | ICD-10-CM | POA: Diagnosis not present

## 2016-01-09 DIAGNOSIS — I10 Essential (primary) hypertension: Secondary | ICD-10-CM | POA: Diagnosis not present

## 2016-01-09 DIAGNOSIS — N183 Chronic kidney disease, stage 3 (moderate): Secondary | ICD-10-CM | POA: Diagnosis not present

## 2016-01-09 DIAGNOSIS — I2510 Atherosclerotic heart disease of native coronary artery without angina pectoris: Secondary | ICD-10-CM | POA: Diagnosis not present

## 2016-01-09 DIAGNOSIS — E039 Hypothyroidism, unspecified: Secondary | ICD-10-CM | POA: Diagnosis not present

## 2016-01-17 NOTE — Progress Notes (Signed)
Patient ID: Jill Preston, female   DOB: 1933/02/17, 82 y.o.   MRN: [id number] Patient ID: Jill Preston, female   DOB: 1933-04-13, 82 y.o.   MRN: [id number]    Cardiology Office Note    Date:  01/18/2016   ID:  Jill Preston, DOB 1933-08-25, MRN [id number]  PCP:  Rachell Cipro, MD  Cardiologist:   Sanda Klein, MD   Chief Complaint  Patient presents with  . Follow-up    had some chest pain that was sharp, hands and arms went numb, no shortness of breath, has normal edema, occassional lightheaded  and dizziness, has cramping in legs     History of Present Illness:  Jill Preston is a 82 y.o. female returning in follow-up For CAD and CHF. Her diuretic was curtailed when she had evidence of worsening renal function on routine laboratory tests. Within a matter of days she developed severe dyspnea with minimal exertion and even dyspnea at rest, improving after she restarted taking her diuretic. At her last appointment in February we started a tailored diuretic dose (no diuretics if her weight is under 205 pounds but double the dose of diuretic when she exceeds 210 pounds). This has worked quite well for her. Her breathing is pretty good. She has only had to take a double dose of loop diuretic once in the last couple of weeks.  She has "a summer cold". She has dysphagia, dry cough, nasal congestion and drainage. This started about 48 hours ago. She had one episode of sudden very sharp chest pain behind her left breast associated with numbness in both hands. This happened while she was driving and resolve spontaneously. She does not have exertional chest discomfort. She continues to have problems with low back pain and her MRI showed progression of multi-level lumbar spine disease, most prominently at L5-S1. She was considering laser surgery with Dr. Joya Salm, but has been rescheduled Dr. Arnoldo Morale, she believes because the abnormalities are too severe to allow laser surgery. She does not  think she wants to have open surgical repair.  She denies angina pectoris. She had moderate ankle edema bilaterally, now virtually resolved. Her right ankle has a previous injury and always stays a little swollen. She denies fever or chills, hemoptysis, syncope, angina or pleuritic chest pain, focal neurological deficits, abdominal pain, change in bowel pattern or bleeding problems.  Her nuclear stress test performed just In January was normal. LVEF was calculated at 66%.  As far as I can tell, "optimal" fluid balance is at a "dry weight" of around 204-205 pounds.  She has a long history of coronary disease. She underwent proximal LAD bare-metal stenting and balloon angioplasty of the mid LAD in 2013. She has had persistent ischemia in the territory of the diagonal artery on nuclear stress test performed over the last few years. Coronary angiography performed May 2016 showed a widely patent LAD stent, 60% ostial circumflex stenosis, 20-30 percent right coronary artery stenosis. There was concern about possible left coronary artery stenosis but fractional flow reserve was normal (0.96 at baseline, 0.91 during intravenous adenosine infusion). She had good anginal response to Ranexa but developed severe constipation and could not tolerate the medication. She has been intolerant to beta blockers due to bradycardia. She has preserved left ventricular systolic function but has had episodes of acute exacerbation of diastolic heart failure attributed to hypertensive heart disease. December 2015, she was critically ill with an acute left iliofemoral DVT with anticoagulation complicated by retroperitoneal hematoma and hemorrhagic shock,  requiring placement of an inferior vena cava filter. The filter was removed in June 2016.  Past Medical History  Diagnosis Date  . Chronic venous insufficiency     LEA VENOUS, 10/17/2011 - mild reflux in bilateral common femoral veins  . Hyperlipidemia   . Coronary artery disease      a. s/p PCI/BMS to prox LAD and balloon angioplasty to mLAD with suboptimal result in 2000. b. Abnl nuc 2012, cath 09/2011 - showed that the overall territory of potential ischemia was small and attributable to a moderately diseased small second diagonal artery. Med rx.  . Hypertension   . Chronic diastolic CHF (congestive heart failure) ([place])   . Hypothyroidism   . Hypertensive heart disease   . CKD (chronic kidney disease), stage III   . Obesity   . Arthritis   . Stroke [facility])     pt. states she had "light stroke" in sept. 1980    Past Surgical History  Procedure Laterality Date  . Cardiac catheterization Left 09/25/2011    Medical management  . Cardiac catheterization Left 03/25/2001    Normal LV function, LAD residual narrowing of less than 10%, normal ramus intermediate, circumflex, and RCA,   . Cardiac catheterization  09/04/1999    LAD, 3x31mm Tetra stent resulting in a reduction of the 80% stenosis to 0% residual  . Coronary stent placement      LAD  . Bladder surgery  2010    WITH MESH   . Cholecystectomy    . Abdominal hysterectomy    . Rotator cuff repair Right   . Left heart catheterization with coronary angiogram N/A 09/25/2011    Procedure: LEFT HEART CATHETERIZATION WITH CORONARY ANGIOGRAM;  Surgeon: Sanda Klein, MD;  Location: [facility] CATH LAB;  Service: Cardiovascular;  Laterality: N/A;  . Cardiac catheterization N/A 01/26/2015    Procedure: Right/Left Heart Cath and Coronary Angiography;  Surgeon: Troy Sine, MD;  Location: [place] CV LAB;  Service: Cardiovascular;  Laterality: N/A;  . Cardiac catheterization N/A 01/27/2015    Procedure: Intravascular Pressure Wire/FFR Study;  Surgeon: Burnell Blanks, MD;  Location: [facility] CV LAB;  Service: Cardiovascular;  Laterality: N/A;  . Cardiac catheterization N/A 01/27/2015    Procedure: Right Heart Cath;  Surgeon: Burnell Blanks, MD;  Location: [place] CV LAB;  Service: Cardiovascular;  Laterality:  N/A;    Outpatient Prescriptions Prior to Visit  Medication Sig Dispense Refill  . acetaminophen (TYLENOL) 650 MG CR tablet Take 1,300 mg by mouth every 8 (eight) hours as needed for pain.     Marland Kitchen aspirin EC 81 MG tablet Take 1 tablet (81 mg total) by mouth daily. 90 tablet 3  . docusate sodium (COLACE) 100 MG capsule Take 100 mg by mouth daily.    . furosemide (LASIX) 20 MG tablet Take 2 tablets (40 mg total) by mouth daily. 60 tablet 12  . isosorbide mononitrate (IMDUR) 30 MG 24 hr tablet Take 2 tablets (60 mg total) by mouth 2 (two) times daily. KEEP OV. 120 tablet 1  . Melatonin 3 MG CAPS Take 1 capsule by mouth at bedtime as needed (sleep).     . Multiple Vitamin (MULTIVITAMIN WITH MINERALS) TABS tablet Take 1 tablet by mouth daily. Centrum 50+    . nitroGLYCERIN (NITROSTAT) 0.4 MG SL tablet Place 1 tablet (0.4 mg total) under the tongue every 5 (five) minutes as needed for chest pain. 25 tablet 2  . nystatin ointment (MYCOSTATIN) Apply 1 application topically at  bedtime.   0  . potassium chloride (K-DUR) 10 MEQ tablet Take 1 tablet (10 mEq total) by mouth daily. 90 tablet 3  . pravastatin (PRAVACHOL) 80 MG tablet Take 80 mg by mouth at bedtime. Take 1 tab daily  0  . Lactobacillus-Inulin (CULTURELLE DIGESTIVE HEALTH PO) Take 1 tablet by mouth daily. Reported on 01/18/2016    . levothyroxine (SYNTHROID, LEVOTHROID) 125 MCG tablet Take 125 mcg by mouth daily before breakfast.     No facility-administered medications prior to visit.     Allergies:   Atorvastatin; Latex; Shellfish allergy; Eggs or egg-derived products; Oxybutynin chloride; Ranexa; Ciprofloxacin; Codeine; Contrast media; Gelnique; and Penicillins   Social History   Social History  . Marital Status: Married    Spouse Name: N/A  . Number of Children: N/A  . Years of Education: N/A   Social History Main Topics  . Smoking status: Never Smoker   . Smokeless tobacco: Never Used  . Alcohol Use: No  . Drug Use: No  . Sexual  Activity: Not Asked   Other Topics Concern  . None   Social History Narrative     Family History:  The patient's family history includes Cancer in her mother; Heart disease in her brother, father, and sister.   ROS:   Please see the history of present illness.    ROS All other systems reviewed and are negative.   PHYSICAL EXAM:   VS:  BP 129/70 mmHg  Pulse 64  Ht 5' 5.5" (1.664 m)  Wt 94.858 kg (209 lb 2 oz)  BMI 34.26 kg/m2   GEN: Well nourished, well developed, in no acute distress HEENT: normal Neck: no JVD, carotid bruits, or masses Cardiac: RRR; no murmurs, rubs, or gallops, 1-2 plus edema  Respiratory:  clear to auscultation bilaterally, normal work of breathing GI: soft, nontender, nondistended, + BS MS: no deformity or atrophy Skin: warm and dry, no rash Neuro:  Alert and Oriented x 3, Strength and sensation are intact Psych: euthymic mood, full affect  Wt Readings from Last 3 Encounters:  01/18/16 94.858 kg (209 lb 2 oz)  11/18/15 94.938 kg (209 lb 4.8 oz)  11/02/15 93.895 kg (207 lb)      Studies/Labs Reviewed:   EKG:  EKG is ordered today.    Recent Labs: 01/28/2015: Hemoglobin 13.4; Platelets 153 06/27/2015: ALT 30* 11/17/2015: BUN 19; Creat 1.25*; Potassium 4.4; Sodium 140   Lipid Panel    Component Value Date/Time   CHOL 187 06/27/2015 1336   TRIG 239* 06/27/2015 1336   HDL 45* 06/27/2015 1336   CHOLHDL 4.2 06/27/2015 1336   VLDL 48* 06/27/2015 1336   LDLCALC 94 06/27/2015 1336     ASSESSMENT:    1. Chronic diastolic CHF (congestive heart failure) ([place])   2. CAD in native artery   3. Essential hypertension   4. Chronic venous insufficiency   5. Obesity   6. Dyslipidemia   7. CKD (chronic kidney disease), stage III      PLAN:  In order of problems listed above:  1. CHF: Clearly, Mckena has a rather narrow margin of compensation between renal insufficiency and symptomatic left heart failure. The tailored diuretic prescription seems  to be working reasonably well for her (no diuretics if her weight is under 205 pounds but double the dose of diuretic when she exceeds 210 pounds).  2. CAD: Currently appears to be free of angina. Limitations in use of antianginal medications described above. The episode of chest discomfort while  driving sounds distinctly nonanginal. It may be related to cervical spine disease 3. HTN:  Well controlled. She should always use a blood pressure monitor with a large arm cuff 4. Peripheral venous insufficiency explains some of her ankle edema. Currently not a big problem. I think her weight is a better parameter to use in deciding on diuretic dose adjustment. 5. Obesity: Weight loss recommended 6. Hyperlipidemia: Satisfactory lipid profile.  7. CKD: We will have to tolerate some degree of worsening renal function to prevent severe symptomatic heart failure. 8. Lumbar spine disease. She does not want to have conventional surgery. If she does decide on surgery, she is at a low to moderate risk for cardiovascular complications, primarily heart failure decompensation.   Medication Adjustments/Labs and Tests Ordered: Current medicines are reviewed at length with the patient today.  Concerns regarding medicines are outlined above.  Medication changes, Labs and Tests ordered today are listed in the Patient Instructions below. Patient Instructions  Medication Instructions:  No Changes  Labwork: None  Testing/Procedures: none  Follow-Up: 6 Months with Dr Sallyanne Kuster  Any Other Special Instructions Will Be Listed Below (If Applicable).     If you need a refill on your cardiac medications before your next appointment, please call your pharmacy.         Mikael Spray, MD  01/18/2016 9:18 AM    [facility] [place], [place], [place]  [address] Phone: [phone]; Fax: [phone]

## 2016-01-18 ENCOUNTER — Ambulatory Visit (INDEPENDENT_AMBULATORY_CARE_PROVIDER_SITE_OTHER): Payer: PPO | Admitting: Cardiovascular Disease

## 2016-01-18 ENCOUNTER — Encounter: Payer: Self-pay | Admitting: Cardiovascular Disease

## 2016-01-18 VITALS — BP 129/70 | HR 64 | Ht 65.5 in | Wt 209.1 lb

## 2016-01-18 DIAGNOSIS — I2510 Atherosclerotic heart disease of native coronary artery without angina pectoris: Secondary | ICD-10-CM | POA: Diagnosis not present

## 2016-01-18 DIAGNOSIS — E669 Obesity, unspecified: Secondary | ICD-10-CM

## 2016-01-18 DIAGNOSIS — I10 Essential (primary) hypertension: Secondary | ICD-10-CM

## 2016-01-18 DIAGNOSIS — N183 Chronic kidney disease, stage 3 unspecified: Secondary | ICD-10-CM

## 2016-01-18 DIAGNOSIS — I872 Venous insufficiency (chronic) (peripheral): Secondary | ICD-10-CM

## 2016-01-18 DIAGNOSIS — I5032 Chronic diastolic (congestive) heart failure: Secondary | ICD-10-CM | POA: Diagnosis not present

## 2016-01-18 DIAGNOSIS — E785 Hyperlipidemia, unspecified: Secondary | ICD-10-CM

## 2016-01-18 NOTE — Patient Instructions (Signed)
Medication Instructions:  No Changes  Labwork: None  Testing/Procedures: none  Follow-Up: 6 Months with Dr Sallyanne Kuster  Any Other Special Instructions Will Be Listed Below (If Applicable).     If you need a refill on your cardiac medications before your next appointment, please call your pharmacy.

## 2016-01-20 DIAGNOSIS — M542 Cervicalgia: Secondary | ICD-10-CM | POA: Diagnosis not present

## 2016-01-20 DIAGNOSIS — M545 Low back pain: Secondary | ICD-10-CM | POA: Diagnosis not present

## 2016-01-26 ENCOUNTER — Other Ambulatory Visit: Payer: Self-pay

## 2016-01-26 MED ORDER — ISOSORBIDE MONONITRATE ER 30 MG PO TB24: 60.0000 mg | ORAL_TABLET | Freq: Two times a day (BID) | ORAL | Status: DC

## 2016-01-26 NOTE — Telephone Encounter (Signed)
Rx(s) sent to pharmacy electronically.  

## 2016-02-02 DIAGNOSIS — B351 Tinea unguium: Secondary | ICD-10-CM | POA: Diagnosis not present

## 2016-02-02 DIAGNOSIS — R600 Localized edema: Secondary | ICD-10-CM | POA: Diagnosis not present

## 2016-02-17 ENCOUNTER — Telehealth: Payer: Self-pay | Admitting: Cardiovascular Disease

## 2016-02-17 NOTE — Telephone Encounter (Signed)
New Message   Pt call to get a renewal on her handicap stick. Please call back to discuss

## 2016-02-17 NOTE — Telephone Encounter (Signed)
Line is busy when dialed. 

## 2016-02-20 NOTE — Telephone Encounter (Signed)
Routed to Dr. Sallyanne Kuster for review and form provided for signature

## 2016-02-20 NOTE — Telephone Encounter (Signed)
Returned call to patient. Husband is bringing a handicap placard application form for signature.

## 2016-02-20 NOTE — Telephone Encounter (Addendum)
Pt is calling back to speak with Dr. Loletha Grayer about coming to pick up a new handicap placard , due to the one she has is expired. Please f/u with her.

## 2016-02-20 NOTE — Telephone Encounter (Signed)
Form ready for patient pickup. I have called and notified the patient that this will be at the reception area.

## 2016-02-28 DIAGNOSIS — N644 Mastodynia: Secondary | ICD-10-CM | POA: Diagnosis not present

## 2016-02-28 DIAGNOSIS — Z803 Family history of malignant neoplasm of breast: Secondary | ICD-10-CM | POA: Diagnosis not present

## 2016-02-28 DIAGNOSIS — N63 Unspecified lump in breast: Secondary | ICD-10-CM | POA: Diagnosis not present

## 2016-03-07 DIAGNOSIS — E039 Hypothyroidism, unspecified: Secondary | ICD-10-CM | POA: Diagnosis not present

## 2016-03-09 DIAGNOSIS — I509 Heart failure, unspecified: Secondary | ICD-10-CM | POA: Diagnosis not present

## 2016-03-09 DIAGNOSIS — I10 Essential (primary) hypertension: Secondary | ICD-10-CM | POA: Diagnosis not present

## 2016-03-09 DIAGNOSIS — E039 Hypothyroidism, unspecified: Secondary | ICD-10-CM | POA: Diagnosis not present

## 2016-04-04 ENCOUNTER — Telehealth: Payer: Self-pay | Admitting: Cardiovascular Disease

## 2016-04-04 NOTE — Telephone Encounter (Signed)
Closed encounter °

## 2016-04-09 DIAGNOSIS — M659 Synovitis and tenosynovitis, unspecified: Secondary | ICD-10-CM | POA: Diagnosis not present

## 2016-04-17 DIAGNOSIS — M84374D Stress fracture, right foot, subsequent encounter for fracture with routine healing: Secondary | ICD-10-CM | POA: Diagnosis not present

## 2016-04-20 DIAGNOSIS — M25571 Pain in right ankle and joints of right foot: Secondary | ICD-10-CM | POA: Diagnosis not present

## 2016-04-20 DIAGNOSIS — M79671 Pain in right foot: Secondary | ICD-10-CM | POA: Diagnosis not present

## 2016-04-20 IMAGING — CR DG CHEST 2V
2 series · 2 of 2 positions shown · non-contrast
Comparison: 07/04/2014.

CLINICAL DATA: CABG.

EXAM:
CHEST  2 VIEW

[chest pa]
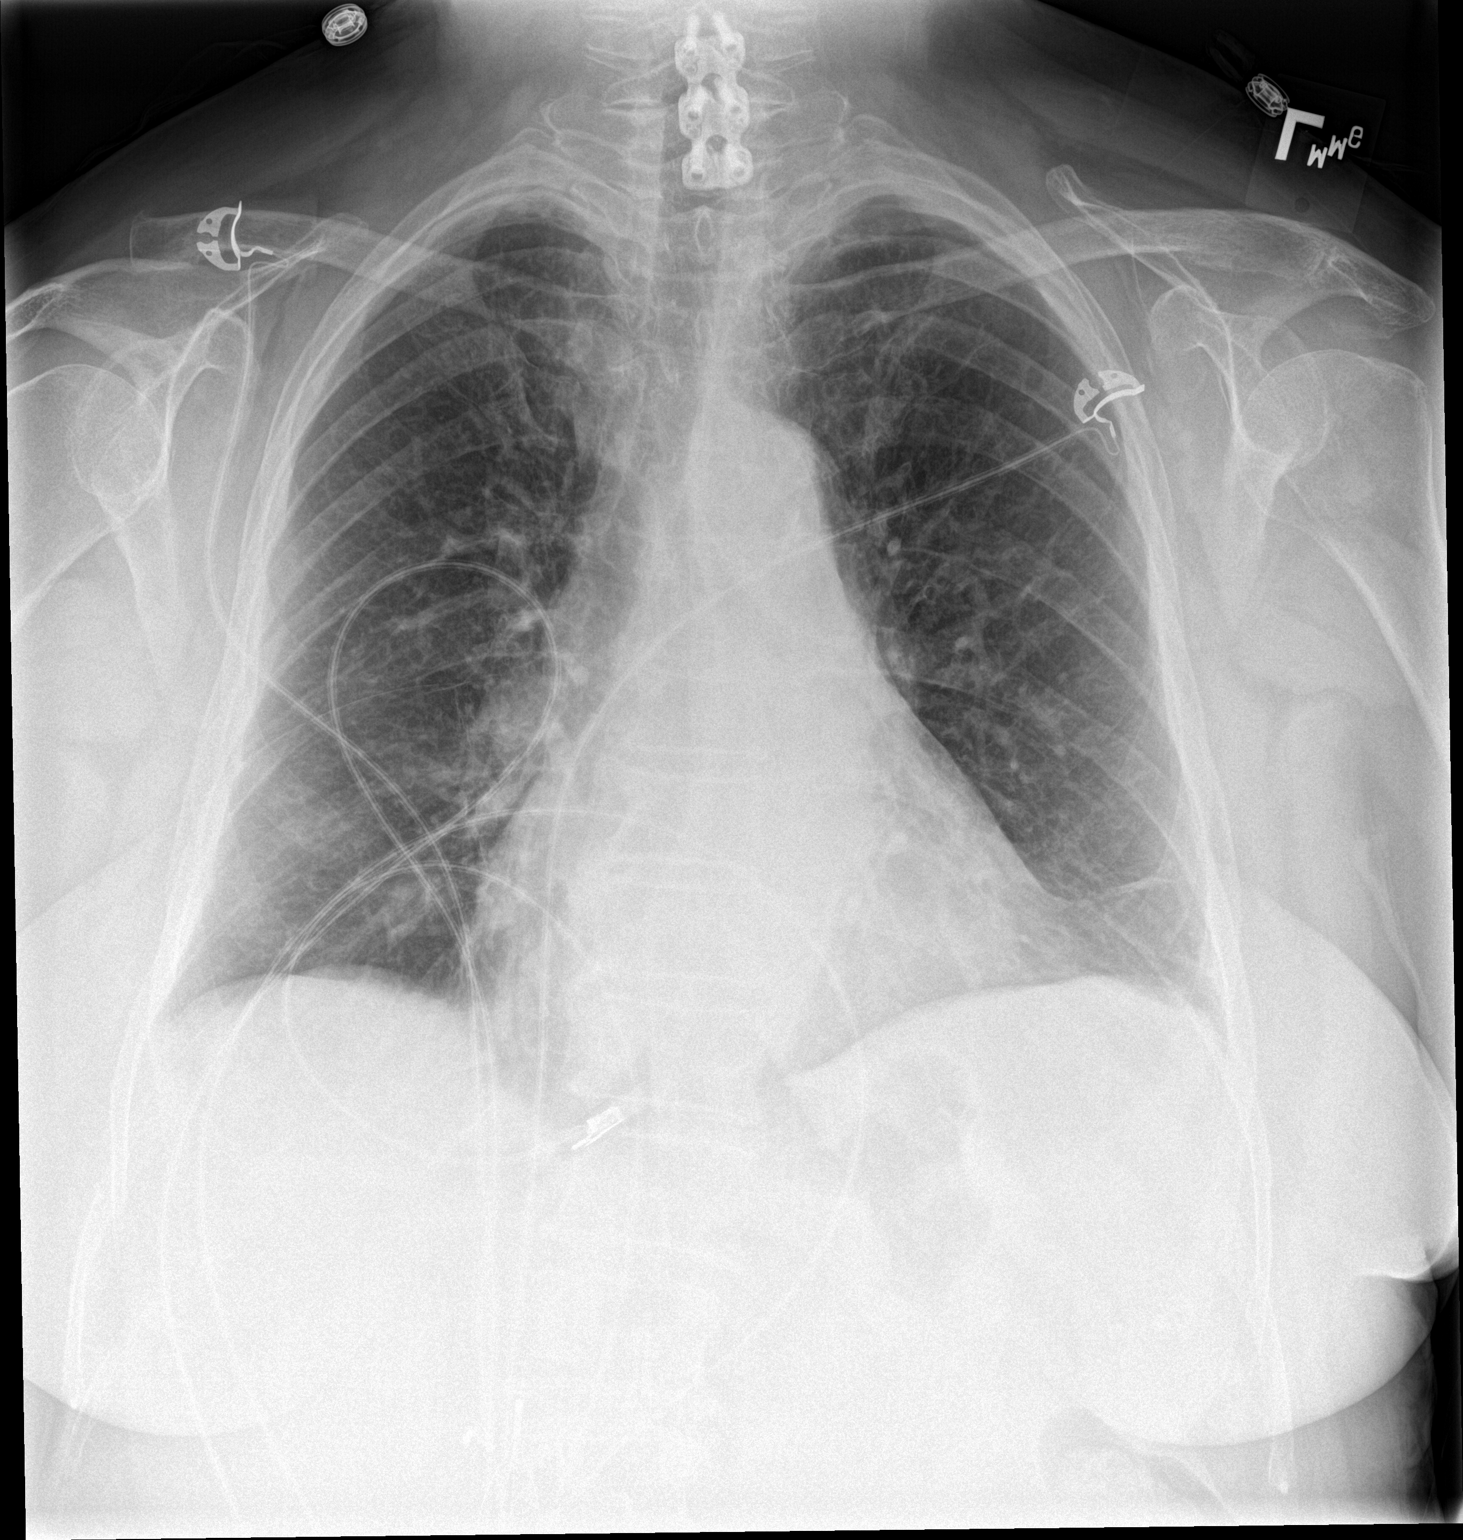

[chest lat]
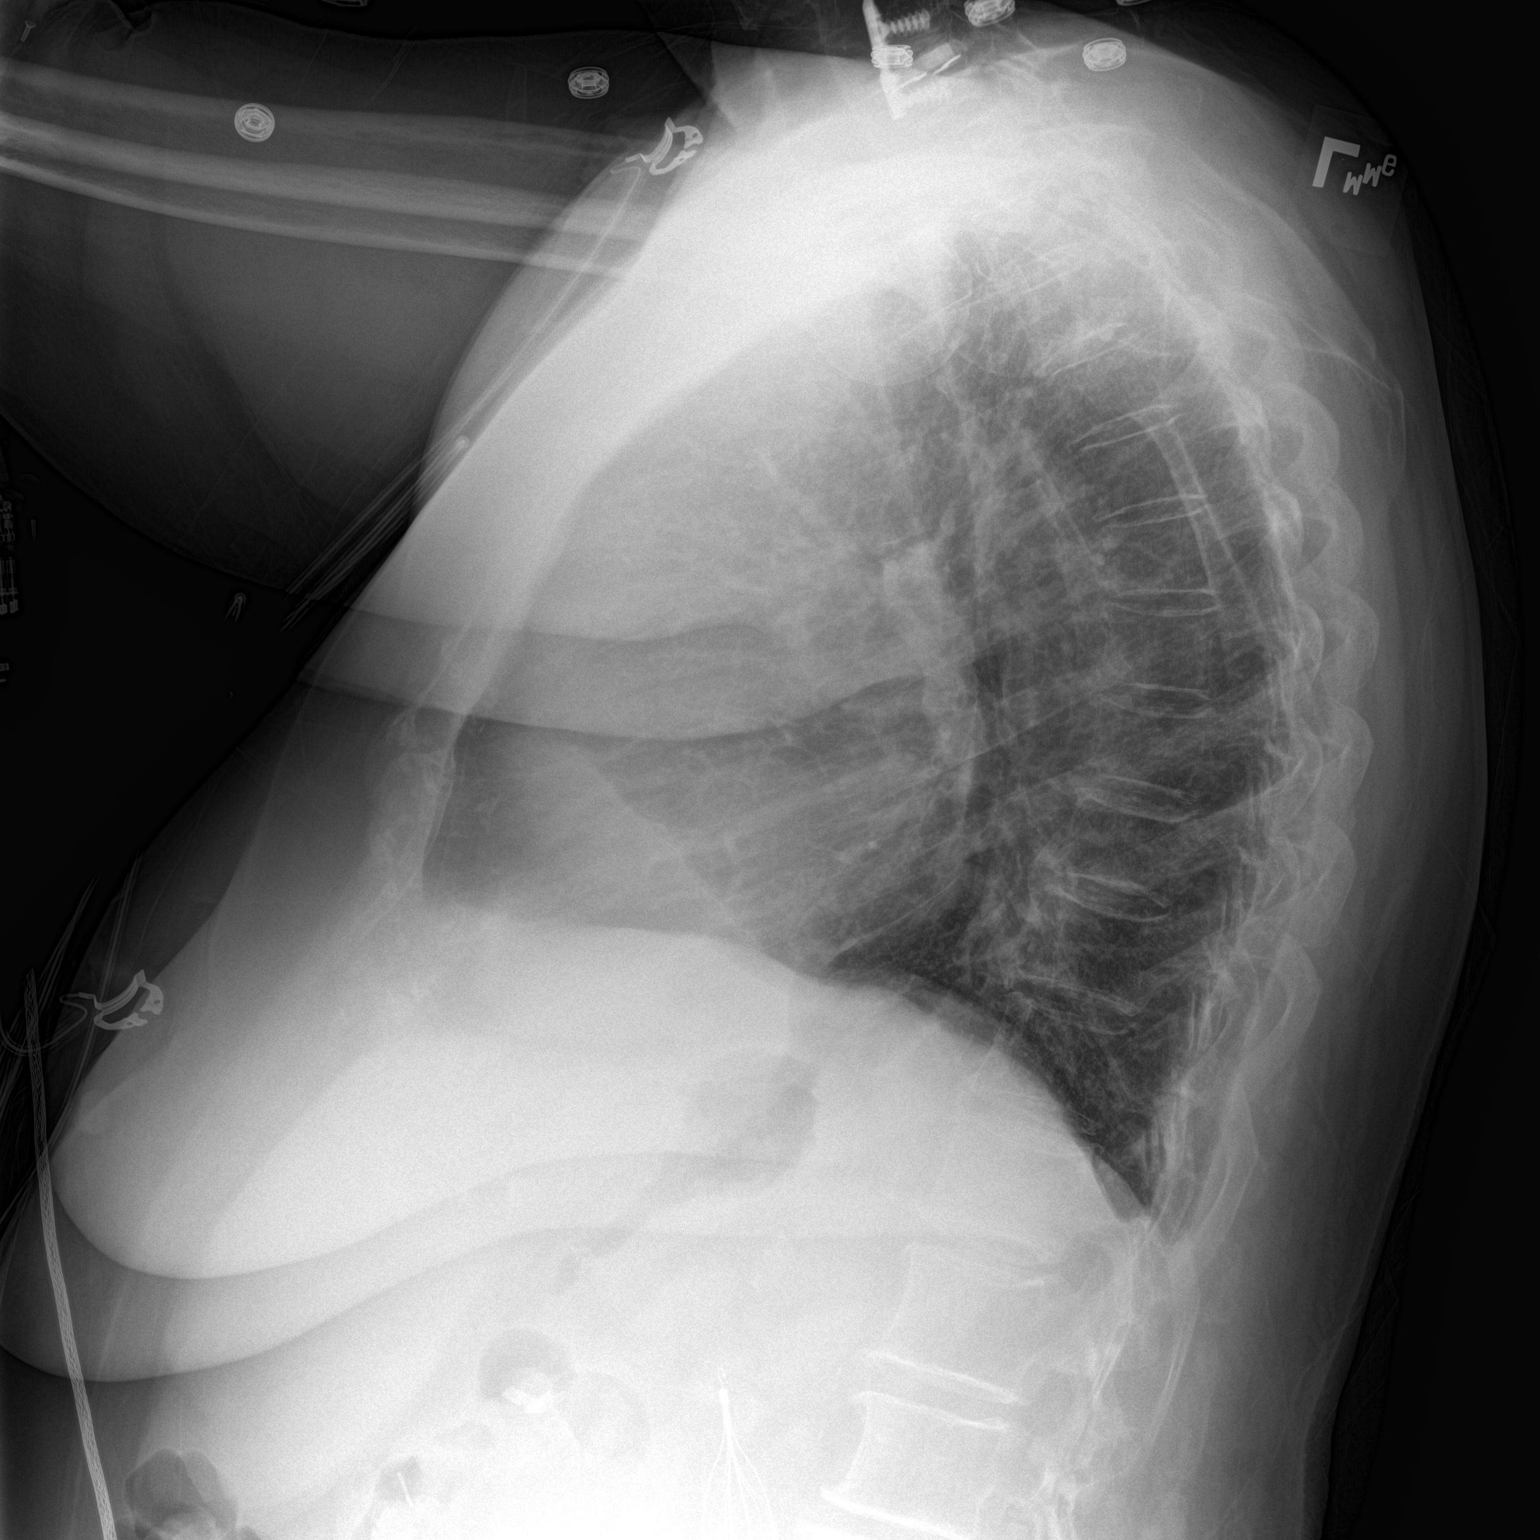

[2 of 2 positions shown; findings below may reference images not displayed]

FINDINGS: Mediastinum and hilar structures are normal. Stable cardiomegaly
with normal pulmonary vascularity. Mild bibasilar subsegmental
atelectasis and/or scarring. No pleural effusion or pneumothorax.
Prior cervical spine fusion. IVC filter noted.
IMPRESSION: 1. Stable cardiomegaly.  No CHF .
2. Mild bibasilar subsegmental atelectasis.

## 2016-05-22 DIAGNOSIS — M25571 Pain in right ankle and joints of right foot: Secondary | ICD-10-CM | POA: Diagnosis not present

## 2016-05-25 DIAGNOSIS — M25562 Pain in left knee: Secondary | ICD-10-CM | POA: Diagnosis not present

## 2016-05-25 DIAGNOSIS — M25571 Pain in right ankle and joints of right foot: Secondary | ICD-10-CM | POA: Diagnosis not present

## 2016-05-31 DIAGNOSIS — M25571 Pain in right ankle and joints of right foot: Secondary | ICD-10-CM | POA: Diagnosis not present

## 2016-05-31 DIAGNOSIS — M25562 Pain in left knee: Secondary | ICD-10-CM | POA: Diagnosis not present

## 2016-06-09 DIAGNOSIS — M25562 Pain in left knee: Secondary | ICD-10-CM | POA: Diagnosis not present

## 2016-06-11 DIAGNOSIS — M25562 Pain in left knee: Secondary | ICD-10-CM | POA: Diagnosis not present

## 2016-06-11 DIAGNOSIS — M25571 Pain in right ankle and joints of right foot: Secondary | ICD-10-CM | POA: Diagnosis not present

## 2016-06-13 DIAGNOSIS — I10 Essential (primary) hypertension: Secondary | ICD-10-CM | POA: Diagnosis not present

## 2016-06-13 DIAGNOSIS — Z136 Encounter for screening for cardiovascular disorders: Secondary | ICD-10-CM | POA: Diagnosis not present

## 2016-06-13 DIAGNOSIS — E039 Hypothyroidism, unspecified: Secondary | ICD-10-CM | POA: Diagnosis not present

## 2016-06-15 DIAGNOSIS — E782 Mixed hyperlipidemia: Secondary | ICD-10-CM | POA: Diagnosis not present

## 2016-06-15 DIAGNOSIS — R600 Localized edema: Secondary | ICD-10-CM | POA: Diagnosis not present

## 2016-06-15 DIAGNOSIS — I509 Heart failure, unspecified: Secondary | ICD-10-CM | POA: Diagnosis not present

## 2016-06-18 ENCOUNTER — Telehealth: Payer: Self-pay | Admitting: Cardiovascular Disease

## 2016-06-18 NOTE — Telephone Encounter (Signed)
Please give contact info for [facility]. Please let her know that many practices may have limited availability for new Medicare patients.

## 2016-06-18 NOTE — Telephone Encounter (Signed)
Pt is supposed to be having back surgery. She wants to know if there any problems with heart preventing from having this surgery?

## 2016-06-18 NOTE — Telephone Encounter (Signed)
Spoke to patient and discussed typical process for cardiac clearance. All questions addressed.  She also wanted to know if Dr. Sallyanne Kuster would recommend a PCP who specializes w senior adults. She currently sees Dr. Ernie Hew but wants to see new provider.

## 2016-06-20 NOTE — Telephone Encounter (Signed)
Recommendations communicated to patient. Gave her contact numbers and addresses for practices.  She is aware to call us if further needs.

## 2016-06-25 ENCOUNTER — Ambulatory Visit (INDEPENDENT_AMBULATORY_CARE_PROVIDER_SITE_OTHER): Payer: PPO | Admitting: Physician Assistant

## 2016-06-25 ENCOUNTER — Encounter: Payer: Self-pay | Admitting: Physician Assistant

## 2016-06-25 ENCOUNTER — Telehealth: Payer: Self-pay | Admitting: Cardiovascular Disease

## 2016-06-25 VITALS — BP 139/83 | HR 85 | Ht 65.5 in | Wt 195.0 lb

## 2016-06-25 DIAGNOSIS — I10 Essential (primary) hypertension: Secondary | ICD-10-CM | POA: Diagnosis not present

## 2016-06-25 DIAGNOSIS — M79604 Pain in right leg: Secondary | ICD-10-CM

## 2016-06-25 DIAGNOSIS — E785 Hyperlipidemia, unspecified: Secondary | ICD-10-CM

## 2016-06-25 DIAGNOSIS — M79605 Pain in left leg: Secondary | ICD-10-CM

## 2016-06-25 DIAGNOSIS — I2510 Atherosclerotic heart disease of native coronary artery without angina pectoris: Secondary | ICD-10-CM

## 2016-06-25 DIAGNOSIS — I5032 Chronic diastolic (congestive) heart failure: Secondary | ICD-10-CM | POA: Diagnosis not present

## 2016-06-25 LAB — BASIC METABOLIC PANEL WITH GFR
BUN: 32 mg/dL — ABNORMAL HIGH (ref 7–25)
CO2: 26 mmol/L (ref 20–31)
Calcium: 10 mg/dL (ref 8.6–10.4)
Chloride: 100 mmol/L (ref 98–110)
Creat: 1.11 mg/dL — ABNORMAL HIGH (ref 0.60–0.88)
GFR, Est African American: 54 mL/min — ABNORMAL LOW (ref >=60)
GFR, Est Non African American: 47 mL/min — ABNORMAL LOW (ref >=60)
Glucose, Bld: 88 mg/dL (ref 65–99)
Potassium: 4 mmol/L (ref 3.5–5.3)
Sodium: 139 mmol/L (ref 135–146)

## 2016-06-25 LAB — HEPATIC FUNCTION PANEL
ALT: 16 U/L (ref 6–29)
AST: 22 U/L (ref 10–35)
Albumin: 4.1 g/dL (ref 3.6–5.1)
Alkaline Phosphatase: 38 U/L (ref 33–130)
Bilirubin, Direct: 0.1 mg/dL (ref <=0.2)
Indirect Bilirubin: 0.5 mg/dL (ref 0.2–1.2)
Total Bilirubin: 0.6 mg/dL (ref 0.2–1.2)
Total Protein: 6.6 g/dL (ref 6.1–8.1)

## 2016-06-25 MED ORDER — GABAPENTIN 300 MG PO CAPS: 300.0000 mg | ORAL_CAPSULE | Freq: Every day | ORAL | 6 refills | Status: DC

## 2016-06-25 NOTE — Telephone Encounter (Signed)
New message    Pt verbalized that she is having issues with her legs   Her feet tingles and go numb and at times she states that she cant walk

## 2016-06-25 NOTE — Telephone Encounter (Signed)
Patient received a brochure from husband-about P.A.D. PATIENT concerned she has many of the symptoms. wolud like to evaluated.  she has an appointment with Dr Lenna Sciara enkins IN REGARDS TO HER LUMBAR SPINE  THIS MONTH.  APPT GIVEN FOR 2:30 PM TODAY WITH MENG.

## 2016-06-25 NOTE — Progress Notes (Signed)
Cardiology Office Note    Date:  06/25/2016   ID:  Jill Preston, DOB 10-Jun-1933, MRN [id number]  PCP:  Rachell Cipro, MD  Cardiologist:  Dr. Sallyanne Kuster   Chief Complaint  Patient presents with  . Follow-up    seen for Dr. Sallyanne Kuster, for evaluation of possible PAD and bilateral leg pain with walking    History of Present Illness:  Jill Preston is a 82 y.o. female with PMH of CAD, chronic venous insufficiency, hyperlipidemia, hypertension, chronic diastolic heart failure, hypothyroidism, stage III CKD, and history of CVA.  In December 2015, she did develop acute left iliofemoral DVT with anticoagulation complicated by retroperitoneal hematoma and hemorrhagic shock, requiring placement of inferior vena cava filter. The filter was removed in June 2016. She had a history of chronic diastolic heart failure with very narrow therapeutic range between acute kidney injury versus fluid overload. She was eventually placed on sliding-scale diuretic with no diuretic if her weight is under 205 pounds and double the dose of diuretic when she exceeded 210 pounds. Her dry weight seems to be around 204-205 pounds. This seems to have worked quite well for her. She has a long history of coronary artery disease and underwent proximal LAD bare metal stenting and balloon angioplasty of mid LAD in 2013. She has had persistent ischemia in the territory of diagonal artery on nuclear stress test over the last few years. Cardiac cath performed in May 2016 showed a widely patent LAD stent, 60% ostial left circumflex stenosis, 20-30% RCA stenosis. There was concern of possible left coronary artery stenosis, but FFR was normal. There was CT surgery consult by Dr. Roxy Horseman regarding her moderate left main disease, after reviewing her chart, it was felt she is likely high risk surgical candidate, therefore medical therapy was recommended. She has not had any further angina symptom. She has good angina response to Ranexa  but developed severe constipation could not tolerate the medication. She is also intolerant to beta blocker due to baseline bradycardia. She has preserved ejection fraction with episodes of acute exacerbation of diastolic heart failure attributed to pulmonary hypertensive disease. Myoview obtained in February 2017 was low risk without ischemia.  Her last office visit was on 01/18/2016 at which times she was doing well from cardiology perspective. 6 month follow-up was recommended. Patient presents today for cardiology evaluation of possible lower extremity peripheral arterial disease. She says her volume status has been fairly stable. She actually lost roughly 10 pounds, her weight today is 195 which is likely her new dry weight. He denies any chest discomfort or shortness of breath. She denies any exertional symptom. She however has chronic lower back pain. She also noticed for the past month, that both of her legs has been having a lot of tingling and pain. The symptom is especially worse with ambulation and also worse on the right lower extremity compared to the left side. She also says her symptom is much worse at night, she is unable to sleep, she could not stop moving. She also had pain in her leg when squeezed down. She has noticeable varicose veins in bilateral lower extremity.    Past Medical History:  Diagnosis Date  . Arthritis   . Chronic diastolic CHF (congestive heart failure) ([place])   . Chronic venous insufficiency    LEA VENOUS, 10/17/2011 - mild reflux in bilateral common femoral veins  . CKD (chronic kidney disease), stage III   . Coronary artery disease    a. s/p PCI/BMS to  prox LAD and balloon angioplasty to mLAD with suboptimal result in 2000. b. Abnl nuc 2012, cath 09/2011 - showed that the overall territory of potential ischemia was small and attributable to a moderately diseased small second diagonal artery. Med rx.  . Hyperlipidemia   . Hypertension   . Hypertensive heart disease    . Hypothyroidism   . Obesity   . Stroke Jill Preston)    pt. states she had "light stroke" in sept. 1980    Past Surgical History:  Procedure Laterality Date  . ABDOMINAL HYSTERECTOMY    . BLADDER SURGERY  2010   WITH MESH   . CARDIAC CATHETERIZATION Left 09/25/2011   Medical management  . CARDIAC CATHETERIZATION Left 03/25/2001   Normal LV function, LAD residual narrowing of less than 10%, normal ramus intermediate, circumflex, and RCA,   . CARDIAC CATHETERIZATION  09/04/1999   LAD, 3x49mm Tetra stent resulting in a reduction of the 80% stenosis to 0% residual  . CARDIAC CATHETERIZATION N/A 01/26/2015   Procedure: Right/Left Heart Cath and Coronary Angiography;  Surgeon: Troy Sine, MD;  Location: [facility];  Service: Cardiovascular;  Laterality: N/A;  . CARDIAC CATHETERIZATION N/A 01/27/2015   Procedure: Intravascular Pressure Wire/FFR Study;  Surgeon: Burnell Blanks, MD;  Location: [facility];  Service: Cardiovascular;  Laterality: N/A;  . CARDIAC CATHETERIZATION N/A 01/27/2015   Procedure: Right Heart Cath;  Surgeon: Burnell Blanks, MD;  Location: [facility];  Service: Cardiovascular;  Laterality: N/A;  . CHOLECYSTECTOMY    . CORONARY STENT PLACEMENT     LAD  . LEFT HEART CATHETERIZATION WITH CORONARY ANGIOGRAM N/A 09/25/2011   Procedure: LEFT HEART CATHETERIZATION WITH CORONARY ANGIOGRAM;  Surgeon: Sanda Klein, MD;  Location: [facility];  Service: Cardiovascular;  Laterality: N/A;  . ROTATOR CUFF REPAIR Right     Current Medications: Outpatient Medications Prior to Visit  Medication Sig Dispense Refill  . acetaminophen (TYLENOL) 650 MG CR tablet Take 1,300 mg by mouth every 8 (eight) hours as needed for pain.     Marland Kitchen aspirin EC 81 MG tablet Take 1 tablet (81 mg total) by mouth daily. 90 tablet 3  . docusate sodium (COLACE) 100 MG capsule Take 100 mg by mouth daily.    . fenofibrate 160 MG tablet Take 1 tablet by mouth daily.  0  . isosorbide  mononitrate (IMDUR) 30 MG 24 hr tablet Take 2 tablets (60 mg total) by mouth 2 (two) times daily. 120 tablet 11  . losartan-hydrochlorothiazide (HYZAAR) 100-25 MG tablet Take 0.5 tablets by mouth daily.  0  . Melatonin 3 MG CAPS Take 1 capsule by mouth at bedtime as needed (sleep).     . Multiple Vitamin (MULTIVITAMIN WITH MINERALS) TABS tablet Take 1 tablet by mouth daily. Centrum 50+    . nitroGLYCERIN (NITROSTAT) 0.4 MG SL tablet Place 1 tablet (0.4 mg total) under the tongue every 5 (five) minutes as needed for chest pain. 25 tablet 2  . potassium chloride (K-DUR) 10 MEQ tablet Take 1 tablet (10 mEq total) by mouth daily. 90 tablet 3  . pravastatin (PRAVACHOL) 80 MG tablet Take 80 mg by mouth at bedtime. Take 1 tab daily  0  . furosemide (LASIX) 20 MG tablet Take 2 tablets (40 mg total) by mouth daily. 60 tablet 12  . levothyroxine (SYNTHROID, LEVOTHROID) 150 MCG tablet Take 1 tablet by mouth daily.  0  . nystatin ointment (MYCOSTATIN) Apply 1 application topically at bedtime.  0   No facility-administered medications prior to visit.      Allergies:   Atorvastatin; Latex; Shellfish allergy; Eggs or egg-derived products; Oxybutynin chloride; Ranexa [ranolazine]; Ciprofloxacin; Codeine; Contrast media [iodinated diagnostic agents]; Gelnique [oxybutynin]; and Penicillins   Social History   Social History  . Marital status: Married    Spouse name: N/A  . Number of children: N/A  . Years of education: N/A   Social History Main Topics  . Smoking status: Never Smoker  . Smokeless tobacco: Never Used  . Alcohol use No  . Drug use: No  . Sexual activity: Not Asked   Other Topics Concern  . None   Social History Narrative  . None     Family History:  The patient's family history includes Cancer in her mother; Heart disease in her brother, father, and sister.   ROS:   Please see the history of present illness.    ROS All other systems reviewed and are negative.   PHYSICAL EXAM:    VS:  BP 139/83   Pulse 85   Ht 5' 5.5" (1.664 m)   Wt 195 lb (88.5 kg)   BMI 31.96 kg/m    GEN: Well nourished, well developed, in no acute distress  HEENT: normal  Neck: no JVD, carotid bruits, or masses Cardiac: RRR; no murmurs, rubs, or gallops,no edema  Respiratory:  clear to auscultation bilaterally, normal work of breathing GI: soft, nontender, nondistended, + BS MS: no deformity or atrophy  Skin: warm and dry, no rash Neuro:  Alert and Oriented x 3, Strength and sensation are intact Psych: euthymic mood, full affect  Wt Readings from Last 3 Encounters:  06/25/16 195 lb (88.5 kg)  01/18/16 209 lb 2 oz (94.9 kg)  11/18/15 209 lb 4.8 oz (94.9 kg)      Studies/Labs Reviewed:   EKG:  EKG is not ordered today.    Recent Labs: 06/27/2015: ALT 30 11/17/2015: Brain Natriuretic Peptide 127.3; BUN 19; Creat 1.25; Potassium 4.4; Sodium 140   Lipid Panel    Component Value Date/Time   CHOL 187 06/27/2015 1336   TRIG 239 (H) 06/27/2015 1336   HDL 45 (L) 06/27/2015 1336   CHOLHDL 4.2 06/27/2015 1336   VLDL 48 (H) 06/27/2015 1336   LDLCALC 94 06/27/2015 1336    Additional studies/ records that were reviewed today include:   07/05/2014 LV EF: 65% -  70%  ------------------------------------------------------------------- Indications:   CHF - 428.0.  ------------------------------------------------------------------- History:  PMH:  Chest pain. Coronary artery disease. Risk factors: Hypertension. Dyslipidemia.  ------------------------------------------------------------------- Study Conclusions  - Left ventricle: Systolic function was vigorous. The estimated ejection fraction was in the range of 65% to 70%. - Mitral valve: There was mild regurgitation.   Cath 01/27/2015 Conclusion    Ost Cx lesion, 60% stenosed.  LM lesion, 50% stenosed.   1. Moderate distal left main artery stenosis with fractional flow reserve assessment suggesting the lesion  is not flow limiting. (FFR=0.91) 2. Moderate ostial Circumflex stenosis  Recommendations: Medical management of CAD. I would not pursue revascularization strategies at this time. Her chest pressure has been present continuously for months with no relief suggesting it is not cardiac related.      Myoview 10/23/2015 Study Highlights    Nuclear stress EF: 66%.  The left ventricular ejection fraction is hyperdynamic (>65%).  There was no ST segment deviation noted during stress.  The study is normal.  This is a low risk study.   Normal pharmacologic nuclear study  with no evidence for infarct or ischemia.      ASSESSMENT:    1. Bilateral leg pain   2. Chronic diastolic CHF (congestive heart failure) ([place])   3. CAD in native artery   4. Essential hypertension   5. Hyperlipidemia, unspecified hyperlipidemia type      PLAN:  In order of problems listed above:  1. Bilateral leg pain: Some of her symptom is classic for PAD with worsening pain in bilateral legs with ambulation, right worse than left, however she has 2+ pulses bilaterally on physical exam which is not consistent with significant PAD. We will obtain a screening ABI, however my suspicion is this may be neuropathic pain, nerve pain, varicosities or restless leg syndrome. She says she is unable to stay still during sleep because of discomfort. Possible treatment in this case include Neurontin versus Requip. I will start her on a trial of Neurontin 300 mg daily. I will also obtain basic metabolic panel to check her electrolyte and also LFT.  2. Chronic diastolic heart failure: She has lost roughly 10 pounds, her volume status is stable. She does not appears to be dehydrated nor does she appears to be fluid overloaded. There is no significant pitting edema in the lower extremity, she does have 1+ nonpitting edema below. Her lung is clear.   3. CAD: She has occasional acid reflux symptom, however no exertional type symptoms.  No further workup is needed.  4. HTN: Blood pressure stable.  5. HLD: On Pravachol, plan to repeat lipid panel on next follow-up.    Medication Adjustments/Labs and Tests Ordered: Current medicines are reviewed at length with the patient today.  Concerns regarding medicines are outlined above.  Medication changes, Labs and Tests ordered today are listed in the Patient Instructions below. Patient Instructions  Medications:  START Gabapentin (Neurontin) 300 mg daily.   Labwork:  Your physician recommends that you return for lab work today--BMP, LFT   Testing:  Your physician has requested that you have an ankle brachial index (ABI). During this test an ultrasound and blood pressure cuff are used to evaluate the arteries that supply the arms and legs with blood. Allow thirty minutes for this exam. There are no restrictions or special instructions.   Follow-Up:  Your physician recommends that you schedule a follow-up appointment in: 1 month with Almyra Deforest, PA-C or Dr. Sallyanne Kuster.  If you need a refill on your cardiac medications before your next appointment, please call your pharmacy.        Hilbert Corrigan, [place]  06/25/2016 4:53 PM    [facility] [address], [place], [place]  [address] Phone: [phone]; Fax: [phone]

## 2016-06-25 NOTE — Patient Instructions (Signed)
Medications:  START Gabapentin (Neurontin) 300 mg daily.   Labwork:  Your physician recommends that you return for lab work today--BMP, LFT   Testing:  Your physician has requested that you have an ankle brachial index (ABI). During this test an ultrasound and blood pressure cuff are used to evaluate the arteries that supply the arms and legs with blood. Allow thirty minutes for this exam. There are no restrictions or special instructions.   Follow-Up:  Your physician recommends that you schedule a follow-up appointment in: 1 month with Almyra Deforest, PA-C or Dr. Sallyanne Kuster.  If you need a refill on your cardiac medications before your next appointment, please call your pharmacy.

## 2016-06-27 ENCOUNTER — Other Ambulatory Visit: Payer: Self-pay | Admitting: Physician Assistant

## 2016-06-27 DIAGNOSIS — M79605 Pain in left leg: Principal | ICD-10-CM

## 2016-06-27 DIAGNOSIS — M79604 Pain in right leg: Secondary | ICD-10-CM

## 2016-07-03 DIAGNOSIS — M79604 Pain in right leg: Secondary | ICD-10-CM | POA: Diagnosis not present

## 2016-07-03 DIAGNOSIS — Z6832 Body mass index (BMI) 32.0-32.9, adult: Secondary | ICD-10-CM | POA: Diagnosis not present

## 2016-07-04 DIAGNOSIS — E782 Mixed hyperlipidemia: Secondary | ICD-10-CM | POA: Diagnosis not present

## 2016-07-04 DIAGNOSIS — E039 Hypothyroidism, unspecified: Secondary | ICD-10-CM | POA: Diagnosis not present

## 2016-07-06 ENCOUNTER — Other Ambulatory Visit: Payer: Self-pay | Admitting: Neurosurgery

## 2016-07-06 DIAGNOSIS — M79604 Pain in right leg: Secondary | ICD-10-CM

## 2016-07-16 ENCOUNTER — Ambulatory Visit
Admission: RE | Admit: 2016-07-16 | Discharge: 2016-07-16 | Disposition: A | Discharge status: Home / Self Care | Payer: PPO | Source: Ambulatory Visit | Attending: Neurosurgery | Admitting: Neurosurgery

## 2016-07-16 VITALS — BP 97/48 | HR 47

## 2016-07-16 DIAGNOSIS — M47897 Other spondylosis, lumbosacral region: Secondary | ICD-10-CM

## 2016-07-16 DIAGNOSIS — M79604 Pain in right leg: Secondary | ICD-10-CM

## 2016-07-16 DIAGNOSIS — M5126 Other intervertebral disc displacement, lumbar region: Secondary | ICD-10-CM | POA: Diagnosis not present

## 2016-07-16 MED ORDER — ONDANSETRON HCL 4 MG/2ML IJ SOLN
4.0000 mg | Freq: Once | INTRAMUSCULAR | Status: AC
  Administered 2016-07-16: 4 mg via INTRAMUSCULAR

## 2016-07-16 MED ORDER — MEPERIDINE HCL 100 MG/ML IJ SOLN
50.0000 mg | Freq: Once | INTRAMUSCULAR | Status: AC
  Administered 2016-07-16: 50 mg via INTRAMUSCULAR

## 2016-07-16 MED ORDER — IOPAMIDOL (ISOVUE-M 200) INJECTION 41%
15.0000 mL | Freq: Once | INTRAMUSCULAR | Status: AC
  Administered 2016-07-16: 15 mL via INTRATHECAL

## 2016-07-16 MED ORDER — DIAZEPAM 5 MG PO TABS
5.0000 mg | ORAL_TABLET | Freq: Once | ORAL | Status: AC
  Administered 2016-07-16: 5 mg via ORAL

## 2016-07-16 NOTE — Discharge Instructions (Signed)

## 2016-07-17 ENCOUNTER — Inpatient Hospital Stay (HOSPITAL_COMMUNITY): Admission: RE | Admit: 2016-07-17 | Payer: PPO | Source: Ambulatory Visit

## 2016-07-18 ENCOUNTER — Telehealth: Payer: Self-pay

## 2016-07-18 NOTE — Telephone Encounter (Signed)
Spoke with patient after she had a myelogram here 07/16/16.  She states she is doing well today and denies any headache.  jkl

## 2016-07-20 DIAGNOSIS — M5416 Radiculopathy, lumbar region: Secondary | ICD-10-CM | POA: Diagnosis not present

## 2016-07-20 DIAGNOSIS — M5126 Other intervertebral disc displacement, lumbar region: Secondary | ICD-10-CM | POA: Diagnosis not present

## 2016-07-24 ENCOUNTER — Ambulatory Visit (HOSPITAL_COMMUNITY)
Admission: RE | Admit: 2016-07-24 | Discharge: 2016-07-24 | Disposition: A | Discharge status: Home / Self Care | Payer: PPO | Source: Ambulatory Visit | Attending: Cardiovascular Disease | Admitting: Cardiovascular Disease

## 2016-07-24 ENCOUNTER — Other Ambulatory Visit: Payer: Self-pay | Admitting: Physician Assistant

## 2016-07-24 DIAGNOSIS — I10 Essential (primary) hypertension: Secondary | ICD-10-CM | POA: Insufficient documentation

## 2016-07-24 DIAGNOSIS — M79604 Pain in right leg: Secondary | ICD-10-CM | POA: Diagnosis not present

## 2016-07-24 DIAGNOSIS — E785 Hyperlipidemia, unspecified: Secondary | ICD-10-CM | POA: Diagnosis not present

## 2016-07-24 DIAGNOSIS — I2510 Atherosclerotic heart disease of native coronary artery without angina pectoris: Secondary | ICD-10-CM | POA: Diagnosis not present

## 2016-07-24 DIAGNOSIS — Z8673 Personal history of transient ischemic attack (TIA), and cerebral infarction without residual deficits: Secondary | ICD-10-CM | POA: Insufficient documentation

## 2016-07-24 DIAGNOSIS — I739 Peripheral vascular disease, unspecified: Secondary | ICD-10-CM | POA: Diagnosis not present

## 2016-07-24 DIAGNOSIS — M79605 Pain in left leg: Secondary | ICD-10-CM | POA: Diagnosis not present

## 2016-07-25 ENCOUNTER — Ambulatory Visit (INDEPENDENT_AMBULATORY_CARE_PROVIDER_SITE_OTHER): Payer: PPO | Admitting: Physician Assistant

## 2016-07-25 ENCOUNTER — Encounter: Payer: Self-pay | Admitting: Physician Assistant

## 2016-07-25 VITALS — BP 122/72 | HR 85 | Ht 65.5 in | Wt 195.4 lb

## 2016-07-25 DIAGNOSIS — I2510 Atherosclerotic heart disease of native coronary artery without angina pectoris: Secondary | ICD-10-CM | POA: Diagnosis not present

## 2016-07-25 DIAGNOSIS — I5032 Chronic diastolic (congestive) heart failure: Secondary | ICD-10-CM

## 2016-07-25 DIAGNOSIS — M79604 Pain in right leg: Secondary | ICD-10-CM | POA: Diagnosis not present

## 2016-07-25 DIAGNOSIS — E785 Hyperlipidemia, unspecified: Secondary | ICD-10-CM

## 2016-07-25 DIAGNOSIS — I10 Essential (primary) hypertension: Secondary | ICD-10-CM | POA: Diagnosis not present

## 2016-07-25 DIAGNOSIS — M79605 Pain in left leg: Secondary | ICD-10-CM

## 2016-07-25 NOTE — Progress Notes (Signed)
Cardiology Office Note    Date:  07/25/2016   ID:  Jill Preston, DOB 1933-03-25, MRN [id number]  PCP:  Merrilee Seashore, MD  Cardiologist:  Dr. Sallyanne Kuster  Chief Complaint  Patient presents with  . Follow-up    seen for Dr. Sallyanne Kuster, review recent LE arterial doppler    History of Present Illness:  Jill Preston is a 82 y.o. female  with PMH of CAD, chronic venous insufficiency, hyperlipidemia, hypertension, chronic diastolic heart failure, hypothyroidism, stage III CKD, and history of CVA.  In December 2015, she did develop acute left iliofemoral DVT with anticoagulation complicated by retroperitoneal hematoma and hemorrhagic shock, requiring placement of inferior vena cava filter. The filter was removed in June 2016. She had a history of chronic diastolic heart failure with very narrow therapeutic range between acute kidney injury versus fluid overload. She was eventually placed on sliding-scale diuretic with no diuretic if her weight is under 205 pounds and double the dose of diuretic when she exceeded 210 pounds. Her dry weight seems to be around 204-205 pounds. This seems to have worked quite well for her. She has a long history of coronary artery disease and underwent proximal LAD bare metal stenting and balloon angioplasty of mid LAD in 2013. She has had persistent ischemia in the territory of diagonal artery on nuclear stress test over the last few years. Cardiac cath performed in May 2016 showed a widely patent LAD stent, 60% ostial left circumflex stenosis, 20-30% RCA stenosis. There was concern of possible left coronary artery stenosis, but FFR was normal. There was CT surgery consult by Dr. Roxy Horseman regarding her moderate left main disease, after reviewing her chart, it was felt she is likely high risk surgical candidate, therefore medical therapy was recommended. She has not had any further angina symptom. She has good angina response to Ranexa but developed severe  constipation could not tolerate the medication. She is also intolerant to beta blocker due to baseline bradycardia. She has preserved ejection fraction with episodes of acute exacerbation of diastolic heart failure attributed to pulmonary hypertensive disease. Myoview obtained in February 2017 was low risk without ischemia.  She was last seen in the office on 06/25/3016, she was complaining of lower extremity pain. Her weight at the time was 195 pounds which was 10 pounds lower than her baseline. Her symptom was much worse at night, however also worse with ambulation. The right side is worse than the left side. Outpatient lower extremity arterial Doppler was negative for significant blockage. I did start the patient on Neurontin 300 mg daily. She presents today for follow-up, she has not noticed any significant change after starting in gabapentin. She says she was diagnosed with a pinched nerve likely causing her issue. She still have a great deal of right lower extremity pain. She is planning to undergo surgery by Dr. Arnoldo Morale. She denies any recent chest discomfort or shortness of breath. She has not used her sliding scale Lasix for long time. She appears to be stable on cardiac perspective. Since that she just had a negative Myoview back in February 2017, no further ischemic workup is needed prior to surgery. I have discussed this with Dr. Sallyanne Kuster, who agreed with the plan. Since she has not noticed any benefit after starting gabapentin, I would discontinue it now.   Past Medical History:  Diagnosis Date  . Arthritis   . Chronic diastolic CHF (congestive heart failure) ([place])   . Chronic venous insufficiency    LEA VENOUS, 10/17/2011 -  mild reflux in bilateral common femoral veins  . CKD (chronic kidney disease), stage III   . Coronary artery disease    a. s/p PCI/BMS to prox LAD and balloon angioplasty to mLAD with suboptimal result in 2000. b. Abnl nuc 2012, cath 09/2011 - showed that the overall  territory of potential ischemia was small and attributable to a moderately diseased small second diagonal artery. Med rx.  . Hyperlipidemia   . Hypertension   . Hypertensive heart disease   . Hypothyroidism   . Obesity   . Stroke [facility])    pt. states she had "light stroke" in sept. 1980    Past Surgical History:  Procedure Laterality Date  . ABDOMINAL HYSTERECTOMY    . BLADDER SURGERY  2010   WITH MESH   . CARDIAC CATHETERIZATION Left 09/25/2011   Medical management  . CARDIAC CATHETERIZATION Left 03/25/2001   Normal LV function, LAD residual narrowing of less than 10%, normal ramus intermediate, circumflex, and RCA,   . CARDIAC CATHETERIZATION  09/04/1999   LAD, 3x7mm Tetra stent resulting in a reduction of the 80% stenosis to 0% residual  . CARDIAC CATHETERIZATION N/A 01/26/2015   Procedure: Right/Left Heart Cath and Coronary Angiography;  Surgeon: Troy Sine, MD;  Location: [facility];  Service: Cardiovascular;  Laterality: N/A;  . CARDIAC CATHETERIZATION N/A 01/27/2015   Procedure: Intravascular Pressure Wire/FFR Study;  Surgeon: Burnell Blanks, MD;  Location: [facility];  Service: Cardiovascular;  Laterality: N/A;  . CARDIAC CATHETERIZATION N/A 01/27/2015   Procedure: Right Heart Cath;  Surgeon: Burnell Blanks, MD;  Location: [facility];  Service: Cardiovascular;  Laterality: N/A;  . CHOLECYSTECTOMY    . CORONARY STENT PLACEMENT     LAD  . LEFT HEART CATHETERIZATION WITH CORONARY ANGIOGRAM N/A 09/25/2011   Procedure: LEFT HEART CATHETERIZATION WITH CORONARY ANGIOGRAM;  Surgeon: Sanda Klein, MD;  Location: [facility];  Service: Cardiovascular;  Laterality: N/A;  . ROTATOR CUFF REPAIR Right     Current Medications: Outpatient Medications Prior to Visit  Medication Sig Dispense Refill  . acetaminophen (TYLENOL) 650 MG CR tablet Take 1,300 mg by mouth every 8 (eight) hours as needed for pain.     Marland Kitchen aspirin EC 81 MG tablet Take 1 tablet (81  mg total) by mouth daily. 90 tablet 3  . docusate sodium (COLACE) 100 MG capsule Take 100 mg by mouth daily.    . fenofibrate 160 MG tablet Take 1 tablet by mouth daily.  0  . furosemide (LASIX) 20 MG tablet Take 40 mg by mouth daily as needed.    . isosorbide mononitrate (IMDUR) 30 MG 24 hr tablet Take 2 tablets (60 mg total) by mouth 2 (two) times daily. 120 tablet 11  . levothyroxine (SYNTHROID, LEVOTHROID) 137 MCG tablet Take 137 mcg by mouth daily before breakfast.    . losartan-hydrochlorothiazide (HYZAAR) 100-25 MG tablet Take 0.5 tablets by mouth daily.  0  . Melatonin 3 MG CAPS Take 1 capsule by mouth at bedtime as needed (sleep).     . Multiple Vitamin (MULTIVITAMIN WITH MINERALS) TABS tablet Take 1 tablet by mouth daily. Centrum 50+    . nitroGLYCERIN (NITROSTAT) 0.4 MG SL tablet Place 1 tablet (0.4 mg total) under the tongue every 5 (five) minutes as needed for chest pain. 25 tablet 2  . nystatin (MYCOSTATIN/NYSTOP) powder Apply 1 g topically 4 (four) times daily.    . potassium chloride (K-DUR) 10 MEQ tablet Take 1  tablet (10 mEq total) by mouth daily. 90 tablet 3  . gabapentin (NEURONTIN) 300 MG capsule Take 1 capsule (300 mg total) by mouth daily. 30 capsule 6   No facility-administered medications prior to visit.      Allergies:   Atorvastatin; Eggs or egg-derived products; Latex; Shellfish allergy; Ciprofloxacin; Codeine; Contrast media [iodinated diagnostic agents]; Gelnique [oxybutynin]; Penicillins; and Ranexa [ranolazine]   Social History   Social History  . Marital status: Married    Spouse name: N/A  . Number of children: N/A  . Years of education: N/A   Social History Main Topics  . Smoking status: Never Smoker  . Smokeless tobacco: Never Used  . Alcohol use No  . Drug use: No  . Sexual activity: Not Asked   Other Topics Concern  . None   Social History Narrative  . None     Family History:  The patient's family history includes Cancer in her mother;  Heart disease in her brother, father, and sister.   ROS:   Please see the history of present illness.    ROS All other systems reviewed and are negative.   PHYSICAL EXAM:   VS:  BP 122/72   Pulse 85   Ht 5' 5.5" (1.664 m)   Wt 195 lb 6.4 oz (88.6 kg)   BMI 32.02 kg/m    GEN: Well nourished, well developed, in no acute distress  HEENT: normal  Neck: no JVD, carotid bruits, or masses Cardiac: RRR; no murmurs, rubs, or gallops,no edema  Respiratory:  clear to auscultation bilaterally, normal work of breathing GI: soft, nontender, nondistended, + BS MS: no deformity or atrophy  Skin: warm and dry, no rash Neuro:  Alert and Oriented x 3, Strength and sensation are intact Psych: euthymic mood, full affect  Wt Readings from Last 3 Encounters:  07/25/16 195 lb 6.4 oz (88.6 kg)  06/25/16 195 lb (88.5 kg)  01/18/16 209 lb 2 oz (94.9 kg)      Studies/Labs Reviewed:   EKG:  EKG is not ordered today.   Recent Labs: 11/17/2015: Brain Natriuretic Peptide 127.3 06/25/2016: ALT 16; BUN 32; Creat 1.11; Potassium 4.0; Sodium 139   Lipid Panel    Component Value Date/Time   CHOL 187 06/27/2015 1336   TRIG 239 (H) 06/27/2015 1336   HDL 45 (L) 06/27/2015 1336   CHOLHDL 4.2 06/27/2015 1336   VLDL 48 (H) 06/27/2015 1336   LDLCALC 94 06/27/2015 1336    Additional studies/ records that were reviewed today include:   Echo 07/05/2014 LV EF: 65% -  70%  ------------------------------------------------------------------- Indications:   CHF - 428.0.  ------------------------------------------------------------------- History:  PMH:  Chest pain. Coronary artery disease. Risk factors: Hypertension. Dyslipidemia.  ------------------------------------------------------------------- Study Conclusions  - Left ventricle: Systolic function was vigorous. The estimated ejection fraction was in the range of 65% to 70%. - Mitral valve: There was mild regurgitation.   Cath  01/27/2015 Conclusion    Ost Cx lesion, 60% stenosed.  LM lesion, 50% stenosed.  1. Moderate distal left main artery stenosis with fractional flow reserve assessment suggesting the lesion is not flow limiting. (FFR=0.91) 2. Moderate ostial Circumflex stenosis  Recommendations:Medical management of CAD. I would not pursue revascularization strategies at this time. Her chest pressure has been present continuously for months with no relief suggesting it is not cardiac related.      Myoview 10/23/2015 Study Highlights    Nuclear stress EF: 66%.  The left ventricular ejection fraction is hyperdynamic (>65%).  There  was no ST segment deviation noted during stress.  The study is normal.  This is a low risk study.  Normal pharmacologic nuclear study with no evidence for infarct or ischemia.    LE ABI 07/24/2016 Impressions No evidence of segmental lower extremity arterial disease at rest, bilaterally. ABI's are normal, bilaterally. The great toe-brachial indices are normal, bilaterally.    ASSESSMENT:    1. Bilateral leg pain   2. Chronic diastolic heart failure ([place])   3. Coronary artery disease involving native coronary artery of native heart without angina pectoris   4. Essential hypertension   5. Hyperlipidemia, unspecified hyperlipidemia type      PLAN:  In order of problems listed above:  1. Bilateral leg pain: Per patient, she is being evaluated by Dr. Arnoldo Morale for possible surgery. From cardiology perspective, she is okay to proceed with surgery. Although she does have significant left main disease on previous cath that was medically managed, she also has since had a Myoview. Myoview did not reveal significant reversible abnormalities. She has not noticed any benefit after starting Neurontin, I will discontinue the medication.  2. Chronic diastolic heart failure: Stable physical exam.  3. CAD: Known moderate left main disease, turned down by cardiothoracic  surgery in 2016. Had Myoview in February 2017, no ischemia.  4. Hypertension: Blood pressure was stable on current medication.  5. Hyperlipidemia: Currently on fenofibrate     Medication Adjustments/Labs and Tests Ordered: Current medicines are reviewed at length with the patient today.  Concerns regarding medicines are outlined above.  Medication changes, Labs and Tests ordered today are listed in the Patient Instructions below. Patient Instructions  Medication Instructions:  STOP Gabapentin   Labwork: None   Testing/Procedures: none  Follow-Up: Your physician recommends that you schedule a follow-up appointment in: 4 MONTHS WITH DR Sallyanne Kuster  Any Other Special Instructions Will Be Listed Below (If Applicable)  You have been cleared for back surgery, we will send a copy of this note to Dr Arnoldo Morale.  If you need a refill on your cardiac medications before your next appointment, please call your pharmacy.     Hilbert Corrigan, [place]  07/25/2016 10:48 PM    [facility] [place], [place], [place]  [address] Phone: [phone]; Fax: [phone]

## 2016-07-25 NOTE — Patient Instructions (Addendum)
Medication Instructions:  STOP Gabapentin   Labwork: None   Testing/Procedures: none  Follow-Up: Your physician recommends that you schedule a follow-up appointment in: 4 MONTHS WITH DR Sallyanne Kuster  Any Other Special Instructions Will Be Listed Below (If Applicable)  You have been cleared for back surgery, we will send a copy of this note to Dr Arnoldo Morale.  If you need a refill on your cardiac medications before your next appointment, please call your pharmacy.

## 2016-07-26 DIAGNOSIS — M5126 Other intervertebral disc displacement, lumbar region: Secondary | ICD-10-CM | POA: Diagnosis not present

## 2016-07-26 HISTORY — PX: BACK SURGERY: SHX140

## 2016-09-04 DIAGNOSIS — Z780 Asymptomatic menopausal state: Secondary | ICD-10-CM | POA: Diagnosis not present

## 2016-09-04 DIAGNOSIS — N390 Urinary tract infection, site not specified: Secondary | ICD-10-CM | POA: Diagnosis not present

## 2016-09-04 DIAGNOSIS — M5440 Lumbago with sciatica, unspecified side: Secondary | ICD-10-CM | POA: Diagnosis not present

## 2016-09-04 DIAGNOSIS — Z0000 Encounter for general adult medical examination without abnormal findings: Secondary | ICD-10-CM | POA: Diagnosis not present

## 2016-09-04 DIAGNOSIS — I2510 Atherosclerotic heart disease of native coronary artery without angina pectoris: Secondary | ICD-10-CM | POA: Diagnosis not present

## 2016-09-19 DIAGNOSIS — E782 Mixed hyperlipidemia: Secondary | ICD-10-CM | POA: Diagnosis not present

## 2016-09-19 DIAGNOSIS — E039 Hypothyroidism, unspecified: Secondary | ICD-10-CM | POA: Diagnosis not present

## 2016-10-16 DIAGNOSIS — R350 Frequency of micturition: Secondary | ICD-10-CM | POA: Diagnosis not present

## 2016-10-16 DIAGNOSIS — N390 Urinary tract infection, site not specified: Secondary | ICD-10-CM | POA: Diagnosis not present

## 2016-11-15 ENCOUNTER — Telehealth (HOSPITAL_COMMUNITY): Payer: Self-pay

## 2016-11-15 ENCOUNTER — Encounter: Payer: Self-pay | Admitting: Cardiovascular Disease

## 2016-11-15 ENCOUNTER — Ambulatory Visit (INDEPENDENT_AMBULATORY_CARE_PROVIDER_SITE_OTHER): Payer: PPO | Admitting: Cardiovascular Disease

## 2016-11-15 VITALS — BP 122/62 | HR 81 | Ht 65.0 in | Wt 202.0 lb

## 2016-11-15 DIAGNOSIS — Z6833 Body mass index (BMI) 33.0-33.9, adult: Secondary | ICD-10-CM | POA: Diagnosis not present

## 2016-11-15 DIAGNOSIS — I5032 Chronic diastolic (congestive) heart failure: Secondary | ICD-10-CM

## 2016-11-15 DIAGNOSIS — N183 Chronic kidney disease, stage 3 unspecified: Secondary | ICD-10-CM

## 2016-11-15 DIAGNOSIS — E785 Hyperlipidemia, unspecified: Secondary | ICD-10-CM | POA: Diagnosis not present

## 2016-11-15 DIAGNOSIS — R9431 Abnormal electrocardiogram [ECG] [EKG]: Secondary | ICD-10-CM | POA: Diagnosis not present

## 2016-11-15 DIAGNOSIS — M47897 Other spondylosis, lumbosacral region: Secondary | ICD-10-CM

## 2016-11-15 DIAGNOSIS — E6609 Other obesity due to excess calories: Secondary | ICD-10-CM | POA: Diagnosis not present

## 2016-11-15 DIAGNOSIS — I2510 Atherosclerotic heart disease of native coronary artery without angina pectoris: Secondary | ICD-10-CM | POA: Diagnosis not present

## 2016-11-15 DIAGNOSIS — Z79899 Other long term (current) drug therapy: Secondary | ICD-10-CM

## 2016-11-15 DIAGNOSIS — I10 Essential (primary) hypertension: Secondary | ICD-10-CM | POA: Diagnosis not present

## 2016-11-15 LAB — BASIC METABOLIC PANEL
BUN: 28 mg/dL — ABNORMAL HIGH (ref 7–25)
CO2: 32 mmol/L — ABNORMAL HIGH (ref 20–31)
Calcium: 10.2 mg/dL (ref 8.6–10.4)
Chloride: 98 mmol/L (ref 98–110)
Creat: 1.39 mg/dL — ABNORMAL HIGH (ref 0.60–0.88)
Glucose, Bld: 94 mg/dL (ref 65–99)
Potassium: 3.9 mmol/L (ref 3.5–5.3)
Sodium: 141 mmol/L (ref 135–146)

## 2016-11-15 LAB — MAGNESIUM: Magnesium: 2 mg/dL (ref 1.5–2.5)

## 2016-11-15 MED ORDER — NITROGLYCERIN 0.4 MG SL SUBL: 0.4000 mg | SUBLINGUAL_TABLET | SUBLINGUAL | 2 refills | Status: DC | PRN

## 2016-11-15 NOTE — Progress Notes (Signed)
Patient ID: [name], female   DOB: 04/13/1933, 83 y.o.   MRN: [id number] Patient ID: [name], female   DOB: [birth year], 83 y.o.   MRN: [id number]    Cardiology Office Note    Date:  11/15/2016   ID:  Jill Preston, DOB [birth year], MRN [id number]  PCP:  Jill Seashore, MD  Cardiologist:   Jill Klein, MD   Chief Complaint  Patient presents with  . Follow-up    History of Present Illness:  Jill Preston is a 83 y.o. female returning in follow-up For CAD and CHF.   She has had problems with worsening renal function due to hypovolemia as well as heart failure exacerbation when her diuretics were stopped completely. We had previously seen to establish a "dry weight" of 205-210 pounds, but it seems that she has lost some real weight. He for the holidays she reports that her weight on the home scale was as low as 190 pounds. Today both her home scale in our office scale showed 202 pounds. She describes NYHA functional class II exertional dyspnea but also exertional chest tightness. Her husband Jill Preston underwent DCCV [facility] and when she was walking the long corridor in the [place] she developed dyspnea and chest tightness and had to stop. She also reports that maybe 2 months ago she had a lengthy episode of chest pain at rest that kept her awake at night. She did not seek medical attention. Has not had problems with leg edema, orthopnea or PND  Her electrocardiogram today is quite worrisome. It has new T-wave inversion in leads 2, 3, F and V3-V6. There is even a slight hint of ST elevation in a few of the leads, but well under 1 mm maximum amplitude. The QT interval was markedly prolonged (QTC 541 ms) she is in normal sinus rhythm she is not having any chest tightness right now.  She notes substantial improvement in her low back pain compared to last year. She denies fever or chills, hemoptysis, syncope, angina or pleuritic chest pain, focal neurological  deficits, abdominal pain, change in bowel pattern or bleeding problems.  Her nuclear stress test performed just In January 2017 was normal. LVEF was calculated at 66%.  She has a long history of coronary disease. She underwent proximal LAD bare-metal stenting and balloon angioplasty of the mid LAD in 2013. She has had persistent ischemia in the territory of the diagonal artery on nuclear stress test performed over the last few years. Coronary angiography performed May 2016 showed a widely patent LAD stent, 60% ostial circumflex stenosis, 20-30% right coronary artery stenosis. There was concern about possible left coronary artery stenosis but fractional flow reserve was normal (0.96 at baseline, 0.91 during intravenous adenosine infusion). She had good anginal response to Ranexa but developed severe constipation and could not tolerate the medication. She has been intolerant to beta blockers due to bradycardia. She has preserved left ventricular systolic function but has had episodes of acute exacerbation of diastolic heart failure attributed to hypertensive heart disease. December 2015, she was critically ill with an acute left iliofemoral DVT with anticoagulation complicated by retroperitoneal hematoma and hemorrhagic shock, requiring placement of an inferior vena cava filter. The filter was removed in June 2016.  Past Medical History:  Diagnosis Date  . Arthritis   . Chronic diastolic CHF (congestive heart failure) ([place])   . Chronic venous insufficiency    LEA VENOUS, 10/17/2011 - mild reflux in bilateral common femoral veins  .  CKD (chronic kidney disease), stage III   . Coronary artery disease    a. s/p PCI/BMS to prox LAD and balloon angioplasty to mLAD with suboptimal result in 2000. b. Abnl nuc 2012, cath 09/2011 - showed that the overall territory of potential ischemia was small and attributable to a moderately diseased small second diagonal artery. Med rx.  . Hyperlipidemia   . Hypertension     . Hypertensive heart disease   . Hypothyroidism   . Obesity   . Stroke [facility])    pt. states she had "light stroke" in sept. 1980    Past Surgical History:  Procedure Laterality Date  . ABDOMINAL HYSTERECTOMY    . BLADDER SURGERY  2010   WITH MESH   . CARDIAC CATHETERIZATION Left 09/25/2011   Medical management  . CARDIAC CATHETERIZATION Left 03/25/2001   Normal LV function, LAD residual narrowing of less than 10%, normal ramus intermediate, circumflex, and RCA,   . CARDIAC CATHETERIZATION  09/04/1999   LAD, 3x35mm Tetra stent resulting in a reduction of the 80% stenosis to 0% residual  . CARDIAC CATHETERIZATION N/A 01/26/2015   Procedure: Right/Left Heart Cath and Coronary Angiography;  Surgeon: Jill Sine, MD;  Location: [place] [facility];  Service: Cardiovascular;  Laterality: N/A;  . CARDIAC CATHETERIZATION N/A 01/27/2015   Procedure: Intravascular Pressure Wire/FFR Study;  Surgeon: Jill Blanks, MD;  Location: [place] [facility];  Service: Cardiovascular;  Laterality: N/A;  . CARDIAC CATHETERIZATION N/A 01/27/2015   Procedure: Right Heart Cath;  Surgeon: Jill Blanks, MD;  Location: [place] [facility];  Service: Cardiovascular;  Laterality: N/A;  . CHOLECYSTECTOMY    . CORONARY STENT PLACEMENT     LAD  . LEFT HEART CATHETERIZATION WITH CORONARY ANGIOGRAM N/A 09/25/2011   Procedure: LEFT HEART CATHETERIZATION WITH CORONARY ANGIOGRAM;  Surgeon: Jill Klein, MD;  Location: [place] [facility];  Service: Cardiovascular;  Laterality: N/A;  . ROTATOR CUFF REPAIR Right     Outpatient Medications Prior to Visit  Medication Sig Dispense Refill  . acetaminophen (TYLENOL) 650 MG CR tablet Take 1,300 mg by mouth every 8 (eight) hours as needed for pain.     Marland Kitchen aspirin EC 81 MG tablet Take 1 tablet (81 mg total) by mouth daily. 90 tablet 3  . docusate sodium (COLACE) 100 MG capsule Take 100 mg by mouth daily.    . fenofibrate 160 MG tablet Take 1 tablet by mouth daily.  0  .  furosemide (LASIX) 20 MG tablet Take 40 mg by mouth daily as needed.    . isosorbide mononitrate (IMDUR) 30 MG 24 hr tablet Take 2 tablets (60 mg total) by mouth 2 (two) times daily. 120 tablet 11  . levothyroxine (SYNTHROID, LEVOTHROID) 137 MCG tablet Take 137 mcg by mouth daily before breakfast.    . losartan-hydrochlorothiazide (HYZAAR) 100-25 MG tablet Take 0.5 tablets by mouth daily.  0  . Melatonin 3 MG CAPS Take 1 capsule by mouth at bedtime as needed (sleep).     . Multiple Vitamin (MULTIVITAMIN WITH MINERALS) TABS tablet Take 1 tablet by mouth daily. Centrum 50+    . nystatin (MYCOSTATIN/NYSTOP) powder Apply 1 g topically 4 (four) times daily.    . potassium chloride (K-DUR) 10 MEQ tablet Take 1 tablet (10 mEq total) by mouth daily. 90 tablet 3  . nitroGLYCERIN (NITROSTAT) 0.4 MG SL tablet Place 1 tablet (0.4 mg total) under the tongue every 5 (five) minutes as needed for chest pain. 25 tablet 2  No facility-administered medications prior to visit.      Allergies:   Atorvastatin; Eggs or egg-derived products; Latex; Shellfish allergy; Ciprofloxacin; Codeine; Contrast media [iodinated diagnostic agents]; Gelnique [oxybutynin]; Penicillins; and Ranexa [ranolazine]   Social History   Social History  . Marital status: Married    Spouse name: N/A  . Number of children: N/A  . Years of education: N/A   Social History Main Topics  . Smoking status: Never Smoker  . Smokeless tobacco: Never Used  . Alcohol use No  . Drug use: No  . Sexual activity: Not on file   Other Topics Concern  . Not on file   Social History Narrative  . No narrative on file     Family History:  The patient's family history includes Cancer in her mother; Heart disease in her brother, father, and sister.   ROS:   Please see the history of present illness.    ROS All other systems reviewed and are negative.   PHYSICAL EXAM:   VS:  BP 122/62   Pulse 81   Ht 5\' 5"  (1.651 m)   Wt 91.6 kg (202 lb)    BMI 33.61 kg/m    GEN: Well nourished, well developed, in no acute distress  HEENT: normal  Neck: no JVD, carotid bruits, or masses Cardiac: RRR; no murmurs, rubs, or gallops, 1+ L ankle edema  Respiratory:  clear to auscultation bilaterally, normal work of breathing GI: soft, nontender, nondistended, + BS MS: no deformity or atrophy  Skin: warm and dry, no rash Neuro:  Alert and Oriented x 3, Strength and sensation are intact Psych: euthymic mood, full affect  Wt Readings from Last 3 Encounters:  11/15/16 91.6 kg (202 lb)  07/25/16 88.6 kg (195 lb 6.4 oz)  06/25/16 88.5 kg (195 lb)      Studies/Labs Reviewed:   EKG:  EKG is ordered today.  Compared to the most recent previous tracing from May and other older tracings. It shows sinus rhythm with profound ST segment and T-wave changes throughout the anterior precordial and inferior leads and a markedly prolonged QT interval (see above)  Recent Labs: 11/17/2015: Brain Natriuretic Peptide 127.3 06/25/2016: ALT 16; BUN 32; Creat 1.11; Potassium 4.0; Sodium 139   Lipid Panel    Component Value Date/Time   CHOL 187 06/27/2015 1336   TRIG 239 (H) 06/27/2015 1336   HDL 45 (L) 06/27/2015 1336   CHOLHDL 4.2 06/27/2015 1336   VLDL 48 (H) 06/27/2015 1336   LDLCALC 94 06/27/2015 1336   Labs 09/04/2016 total cholesterol 169, triglycerides 160, HDL 40, LDL 97, hemoglobin A1c 6.1%, glucose 104, creatinine 1.16, BUN 19, TSH 0.40  ASSESSMENT:    1. Abnormal EKG   2. Medication management   3. CAD in native artery   4. Chronic diastolic CHF (congestive heart failure) ([place])   5. Essential hypertension   6. Class 1 obesity due to excess calories with serious comorbidity and body mass index (BMI) of 33.0 to 33.9 in adult   7. Dyslipidemia   8. CKD (chronic kidney disease), stage III   9. Other osteoarthritis of spine, lumbosacral region      PLAN:  In order of problems listed above:  1. CAD: Currently appears to be free of angina, but  had protracted angina a couple of months ago at night at rest. Her EKG shows striking new changes. Check electrolytes to see if this might be the reason.. Limitations in use of antianginal medications described above. Because  of her previous problems with renal insufficiency, we'll start with a repeat stress test, but she might require repeat angiography. Recommend that she seek urgent medical attention if she again has chest discomfort that lasts for more than 30 minutes. 2. CHF: Jill Preston had a rather narrow margin of compensation between renal insufficiency and symptomatic left heart failure, as far as I can tell is close to euvolemic today. Need to reestablish her "dry weight" since she has lost some true weight.  3. HTN:  Well controlled. She should always use a blood pressure monitor with a large arm cuff 4. Obesity: Weight loss recommended 5. Hyperlipidemia: fair lipid profile, but target LDL<70. Intolerant to statins. Discuss PCSK9 at next appointment 6. CKD: We may have to tolerate some degree of worsening renal function to prevent severe symptomatic heart failure. 7. Lumbar spine disease.    Medication Adjustments/Labs and Tests Ordered: Current medicines are reviewed at length with the patient today.  Concerns regarding medicines are outlined above.  Medication changes, Labs and Tests ordered today are listed in the Patient Instructions below. Patient Instructions  Medication Instructions: Dr Sallyanne Kuster recommends that you continue on your current medications as directed. Please refer to the Current Medication list given to you today.  Labwork: Your physician recommends that you return for lab work in [place].  Testing/Procedures: 1. [place] Stress Test - Your physician has requested that you have a lexiscan myoview. For further information please visit [email or web address]. Please follow instruction sheet, as given.  Follow-up: Please schedule a nurse visit for an EKG on  11/26/16.  Dr Sallyanne Kuster recommends that you schedule a follow-up appointment in first available.  If you need a refill on your cardiac medications before your next appointment, please call your pharmacy.      Signed, Jill Klein, MD  11/15/2016 4:52 PM    [facility] [address], [place], [place]  [address] Phone: [phone]; Fax: [phone]

## 2016-11-15 NOTE — Telephone Encounter (Signed)
Encounter complete. 

## 2016-11-15 NOTE — Patient Instructions (Signed)
Medication Instructions: Dr Sallyanne Kuster recommends that you continue on your current medications as directed. Please refer to the Current Medication list given to you today.  Labwork: Your physician recommends that you return for lab work in [place].  Testing/Procedures: 1. [place] Stress Test - Your physician has requested that you have a lexiscan myoview. For further information please visit [email or web address]. Please follow instruction sheet, as given.  Follow-up: Please schedule a nurse visit for an EKG on 11/26/16.  Dr Sallyanne Kuster recommends that you schedule a follow-up appointment in first available.  If you need a refill on your cardiac medications before your next appointment, please call your pharmacy.

## 2016-11-20 ENCOUNTER — Ambulatory Visit (HOSPITAL_COMMUNITY)
Admission: RE | Admit: 2016-11-20 | Discharge: 2016-11-20 | Disposition: A | Discharge status: Home / Self Care | Payer: PPO | Source: Ambulatory Visit | Attending: Cardiovascular Disease | Admitting: Cardiovascular Disease

## 2016-11-20 DIAGNOSIS — Z79899 Other long term (current) drug therapy: Secondary | ICD-10-CM | POA: Diagnosis not present

## 2016-11-20 DIAGNOSIS — Z8673 Personal history of transient ischemic attack (TIA), and cerebral infarction without residual deficits: Secondary | ICD-10-CM | POA: Insufficient documentation

## 2016-11-20 DIAGNOSIS — R079 Chest pain, unspecified: Secondary | ICD-10-CM | POA: Diagnosis not present

## 2016-11-20 DIAGNOSIS — I130 Hypertensive heart and chronic kidney disease with heart failure and stage 1 through stage 4 chronic kidney disease, or unspecified chronic kidney disease: Secondary | ICD-10-CM | POA: Diagnosis not present

## 2016-11-20 DIAGNOSIS — I2510 Atherosclerotic heart disease of native coronary artery without angina pectoris: Secondary | ICD-10-CM | POA: Diagnosis not present

## 2016-11-20 DIAGNOSIS — E079 Disorder of thyroid, unspecified: Secondary | ICD-10-CM | POA: Insufficient documentation

## 2016-11-20 DIAGNOSIS — R9431 Abnormal electrocardiogram [ECG] [EKG]: Secondary | ICD-10-CM | POA: Diagnosis not present

## 2016-11-20 DIAGNOSIS — I509 Heart failure, unspecified: Secondary | ICD-10-CM | POA: Insufficient documentation

## 2016-11-20 DIAGNOSIS — N183 Chronic kidney disease, stage 3 (moderate): Secondary | ICD-10-CM | POA: Diagnosis not present

## 2016-11-20 LAB — MYOCARDIAL PERFUSION IMAGING
LV dias vol: 103 mL (ref 46–106)
LV sys vol: 39 mL
Peak HR: 78 {beats}/min
Rest HR: 59 {beats}/min
SDS: 0
SRS: 1
SSS: 1
TID: 1.11

## 2016-11-20 MED ORDER — TECHNETIUM TC 99M TETROFOSMIN IV KIT
31.8000 | PACK | Freq: Once | INTRAVENOUS | Status: AC | PRN
  Administered 2016-11-20: 31.8 via INTRAVENOUS
  Filled 2016-11-20: qty 32

## 2016-11-20 MED ORDER — TECHNETIUM TC 99M TETROFOSMIN IV KIT
9.1000 | PACK | Freq: Once | INTRAVENOUS | Status: AC | PRN
  Administered 2016-11-20: 9.1 via INTRAVENOUS
  Filled 2016-11-20: qty 10

## 2016-11-20 MED ORDER — AMINOPHYLLINE 25 MG/ML IV SOLN
75.0000 mg | Freq: Once | INTRAVENOUS | Status: AC
  Administered 2016-11-20: 75 mg via INTRAVENOUS

## 2016-11-20 MED ORDER — REGADENOSON 0.4 MG/5ML IV SOLN
0.4000 mg | Freq: Once | INTRAVENOUS | Status: AC
  Administered 2016-11-20: 0.4 mg via INTRAVENOUS

## 2016-11-21 ENCOUNTER — Telehealth: Payer: Self-pay | Admitting: Cardiovascular Disease

## 2016-11-21 NOTE — Telephone Encounter (Signed)
agree

## 2016-11-21 NOTE — Telephone Encounter (Signed)
I returned call and spoke to patient.  She had lexiscan yesterday - she has had these in the past with no problems, but states she felt like she had a reaction to the medication.  She has, in last day, experienced burning when urinating, swelling of genital area, also notes red patches on stomach and red streak at IV site. Poor sleep last night. She reports that the genital discomfort has been relieved with nystatin powder.  I reviewed chart - she had the regadenoson, as well as aminophylline injection yesterday which was given for symptomatic relief during procedure. Discussed w Raquel RPH. Strong indication that the aminophylline is culprit as she had lexiscan 1 year ago w/o reported issue. Added to allergy list for future reference. Raquel recommended Benadryl 25mg  BID (may induce drowsiness), zantac 150mg  daily - if no improvement in 24 hrs, instruct patient to go to urgent care as she may need steroidals.  Recommendation from pharmD relayed to patient -  Also advised if any worsening of symptoms or newer more severe symptoms such as tightness in chest/throat, increased SOB, she should go to ED right away. Pt voiced understanding of recommendations. Does note that this has been almost 24 hrs since stress test and symptoms have not gotten more severe since yesterday evening.  Lastly, made sure patient knew we were very regretful that this reaction happened, and made sure she knows that I would mark the allergy with her reported symptoms and notify physician. Pt voiced appreciation for attention given.  Routed to [employer] and Dr. Sallyanne Kuster for any further recommendations.

## 2016-11-21 NOTE — Telephone Encounter (Signed)
New message    Pt states she had [place] stress test 11/20/16 , she thinks she is having a reaction to the medication given during the test , she is broke out and swollen in her groin area , and it itches and burns when she tries to use bathroom

## 2016-11-22 ENCOUNTER — Telehealth: Payer: Self-pay | Admitting: Cardiovascular Disease

## 2016-11-22 NOTE — Telephone Encounter (Signed)
  Notes Recorded by Sanda Klein, MD on 11/16/2016 at 8:58 AM EST Labs generally OK and do not explain the ECG changes. Go ahead with stress test. Kidney function is abnormal, but close to baseline.   Notes Recorded by Pixie Casino, MD on 11/22/2016 at 8:12 AM EST Negative stress test with normal LV function.  Dr. Lemmie Evens (for Dr. Loletha Grayer)   ---Results given to pt. Pt verbalized understanding. She stated she is still having some burning and is itching all over. Still some "puffy redness" around IV site, but it is better than it was. Reiterated to pt to go to urgent care if Sx do not continue to improve as she may need steroidal medication per pharmacy and to go to ED if tightness in chest/throat or increased SOB. Pt verbalized understanding and appreciation for the call. She stated she will go to urgent care if it is not better in the next 48 hrs because she cannot sit down without discomfort.   --Routing to Dr. Loletha Grayer as Juluis Rainier

## 2016-11-29 DIAGNOSIS — L570 Actinic keratosis: Secondary | ICD-10-CM | POA: Diagnosis not present

## 2016-11-29 DIAGNOSIS — L820 Inflamed seborrheic keratosis: Secondary | ICD-10-CM | POA: Diagnosis not present

## 2016-11-29 DIAGNOSIS — L821 Other seborrheic keratosis: Secondary | ICD-10-CM | POA: Diagnosis not present

## 2016-11-29 DIAGNOSIS — D1801 Hemangioma of skin and subcutaneous tissue: Secondary | ICD-10-CM | POA: Diagnosis not present

## 2016-12-03 ENCOUNTER — Telehealth: Payer: Self-pay | Admitting: Cardiovascular Disease

## 2016-12-03 NOTE — Telephone Encounter (Signed)
New Message  Pt voiced she was suppose to have an appt on 3.23.18 for MD-Berry to go over results but advised pt there is not appt setup and we can provide results over the phone..  Pt voiced she was informed MD-Berry is the only person who can provide results.  Please f/u with pt

## 2016-12-03 NOTE — Telephone Encounter (Signed)
Spoke with pt and went over appts for her and her husband-nothing else needed

## 2016-12-18 DIAGNOSIS — M542 Cervicalgia: Secondary | ICD-10-CM | POA: Diagnosis not present

## 2016-12-18 DIAGNOSIS — I10 Essential (primary) hypertension: Secondary | ICD-10-CM | POA: Diagnosis not present

## 2016-12-18 DIAGNOSIS — H25093 Other age-related incipient cataract, bilateral: Secondary | ICD-10-CM | POA: Diagnosis not present

## 2017-01-04 DIAGNOSIS — M5412 Radiculopathy, cervical region: Secondary | ICD-10-CM | POA: Diagnosis not present

## 2017-01-04 DIAGNOSIS — M542 Cervicalgia: Secondary | ICD-10-CM | POA: Diagnosis not present

## 2017-01-25 ENCOUNTER — Encounter: Payer: Self-pay | Admitting: Cardiovascular Disease

## 2017-01-25 ENCOUNTER — Ambulatory Visit (INDEPENDENT_AMBULATORY_CARE_PROVIDER_SITE_OTHER): Payer: PPO | Admitting: Cardiovascular Disease

## 2017-01-25 VITALS — BP 139/72 | HR 74 | Ht 65.5 in | Wt 207.4 lb

## 2017-01-25 DIAGNOSIS — I5032 Chronic diastolic (congestive) heart failure: Secondary | ICD-10-CM | POA: Diagnosis not present

## 2017-01-25 DIAGNOSIS — N183 Chronic kidney disease, stage 3 unspecified: Secondary | ICD-10-CM

## 2017-01-25 DIAGNOSIS — M79605 Pain in left leg: Secondary | ICD-10-CM

## 2017-01-25 DIAGNOSIS — E6609 Other obesity due to excess calories: Secondary | ICD-10-CM | POA: Diagnosis not present

## 2017-01-25 DIAGNOSIS — I25118 Atherosclerotic heart disease of native coronary artery with other forms of angina pectoris: Secondary | ICD-10-CM | POA: Diagnosis not present

## 2017-01-25 DIAGNOSIS — E785 Hyperlipidemia, unspecified: Secondary | ICD-10-CM | POA: Diagnosis not present

## 2017-01-25 DIAGNOSIS — M79604 Pain in right leg: Secondary | ICD-10-CM

## 2017-01-25 DIAGNOSIS — I10 Essential (primary) hypertension: Secondary | ICD-10-CM

## 2017-01-25 DIAGNOSIS — Z6833 Body mass index (BMI) 33.0-33.9, adult: Secondary | ICD-10-CM

## 2017-01-25 NOTE — Patient Instructions (Signed)
Dr Croitoru recommends that you schedule a follow-up appointment in 12 months. You will receive a reminder letter in the mail two months in advance. If you don't receive a letter, please call our office to schedule the follow-up appointment.  If you need a refill on your cardiac medications before your next appointment, please call your pharmacy. 

## 2017-01-25 NOTE — Progress Notes (Signed)
Patient ID: Jill Preston, female   DOB: 05-Dec-1932, 83 y.o.   MRN: [id number] Patient ID: Jill Preston, female   DOB: [birth year], 83 y.o.   MRN: [id number]    Cardiology Office Note    Date:  01/27/2017   ID:  Jill Preston, DOB 24-Aug-1933, MRN [id number]  PCP:  Merrilee Seashore, MD  Cardiologist:   Sanda Klein, MD   Chief Complaint  Patient presents with  . Follow-up  . Shortness of Breath    occasionally,  . Edema    legs, feet, ankles.    History of Present Illness:  Jill Preston is a 83 y.o. female returning in follow-up For CAD and CHF.   She had some complaints of chest discomfort in March and changes on her electrocardiogram, but her nuclear stress test showed reassuring findings, no evidence of ischemia. EF 66%. She has not really been troubled by chest discomfort since.  She does have NYHA functional class II shortness of breath. Her weight today is roughly 5 pounds higher than it was in March. She does not have a lot of problems with edema, although her ankles do swell intermittently. She is restricted in her activity more due to leg pain and shortness of breath.  Worsening renal function due to hypovolemia has limited use of diuretics, but she developed heart failure exacerbation when we stopped her diuretics completely. It seems she does best at a dry weight of around 200 pounds, but this has been a bit of a moving target recently.  She describes her legs as hurting all the way from her groin down to her toes. Her legs hurt before she even gets out of bed in the morning. There is no clear relationship with physical activity. She has a lot of problems with her spine.  The patient specifically denies any chest pain at rest or with exertion, orthopnea, paroxysmal nocturnal dyspnea, syncope, palpitations, focal neurological deficits, intermittent claudication, cough, hemoptysis or wheezing.  The patient also denies abdominal pain, nausea, vomiting,  dysphagia, diarrhea, constipation, polyuria, polydipsia, dysuria, hematuria, frequency, urgency, abnormal bleeding or bruising, fever, chills, unexpected weight changes, mood swings, change in skin or hair texture, change in voice quality, auditory or visual problems, allergic reactions or rashes.  She has a long history of coronary disease. She underwent proximal LAD bare-metal stenting and balloon angioplasty of the mid LAD in 2013. She has had persistent ischemia in the territory of the diagonal artery on nuclear stress test performed over the last few years. Coronary angiography performed May 2016 showed a widely patent LAD stent, 60% ostial circumflex stenosis, 20-30% right coronary artery stenosis. There was concern about possible left coronary artery stenosis but fractional flow reserve was normal (0.96 at baseline, 0.91 during intravenous adenosine infusion). She had good anginal response to Ranexa but developed severe constipation and could not tolerate the medication. She has been intolerant to beta blockers due to bradycardia. She has preserved left ventricular systolic function but has had episodes of acute exacerbation of diastolic heart failure attributed to hypertensive heart disease. December 2015, she was critically ill with an acute left iliofemoral DVT with anticoagulation complicated by retroperitoneal hematoma and hemorrhagic shock, requiring placement of an inferior vena cava filter. The filter was removed in June 2016.  Past Medical History:  Diagnosis Date  . Arthritis   . Chronic diastolic CHF (congestive heart failure) ([place])   . Chronic venous insufficiency    LEA VENOUS, 10/17/2011 - mild reflux in bilateral common femoral  veins  . CKD (chronic kidney disease), stage III   . Coronary artery disease    a. s/p PCI/BMS to prox LAD and balloon angioplasty to mLAD with suboptimal result in 2000. b. Abnl nuc 2012, cath 09/2011 - showed that the overall territory of potential ischemia  was small and attributable to a moderately diseased small second diagonal artery. Med rx.  . Hyperlipidemia   . Hypertension   . Hypertensive heart disease   . Hypothyroidism   . Obesity   . Stroke [facility])    pt. states she had "light stroke" in sept. 1980    Past Surgical History:  Procedure Laterality Date  . ABDOMINAL HYSTERECTOMY    . BLADDER SURGERY  2010   WITH MESH   . CARDIAC CATHETERIZATION Left 09/25/2011   Medical management  . CARDIAC CATHETERIZATION Left 03/25/2001   Normal LV function, LAD residual narrowing of less than 10%, normal ramus intermediate, circumflex, and RCA,   . CARDIAC CATHETERIZATION  09/04/1999   LAD, 3x31mm Tetra stent resulting in a reduction of the 80% stenosis to 0% residual  . CARDIAC CATHETERIZATION N/A 01/26/2015   Procedure: Right/Left Heart Cath and Coronary Angiography;  Surgeon: Troy Sine, MD;  Location: [place];  Service: Cardiovascular;  Laterality: N/A;  . CARDIAC CATHETERIZATION N/A 01/27/2015   Procedure: Intravascular Pressure Wire/FFR Study;  Surgeon: Burnell Blanks, MD;  Location: [place] [facility];  Service: Cardiovascular;  Laterality: N/A;  . CARDIAC CATHETERIZATION N/A 01/27/2015   Procedure: Right Heart Cath;  Surgeon: Burnell Blanks, MD;  Location: [place] [facility];  Service: Cardiovascular;  Laterality: N/A;  . CHOLECYSTECTOMY    . CORONARY STENT PLACEMENT     LAD  . LEFT HEART CATHETERIZATION WITH CORONARY ANGIOGRAM N/A 09/25/2011   Procedure: LEFT HEART CATHETERIZATION WITH CORONARY ANGIOGRAM;  Surgeon: Sanda Klein, MD;  Location: [facility];  Service: Cardiovascular;  Laterality: N/A;  . ROTATOR CUFF REPAIR Right     Outpatient Medications Prior to Visit  Medication Sig Dispense Refill  . acetaminophen (TYLENOL) 650 MG CR tablet Take 1,300 mg by mouth every 8 (eight) hours as needed for pain.     Marland Kitchen ascorbic acid (VITAMIN C) 1000 MG tablet Take 1,000 mg by mouth 2 (two) times daily.    Marland Kitchen  aspirin EC 81 MG tablet Take 1 tablet (81 mg total) by mouth daily. 90 tablet 3  . docusate sodium (COLACE) 100 MG capsule Take 100 mg by mouth daily.    . fenofibrate 160 MG tablet Take 1 tablet by mouth daily.  0  . furosemide (LASIX) 20 MG tablet Take 40 mg by mouth daily as needed.    . isosorbide mononitrate (IMDUR) 30 MG 24 hr tablet Take 2 tablets (60 mg total) by mouth 2 (two) times daily. 120 tablet 11  . levothyroxine (SYNTHROID, LEVOTHROID) 137 MCG tablet Take 137 mcg by mouth daily before breakfast.    . losartan-hydrochlorothiazide (HYZAAR) 100-25 MG tablet Take 0.5 tablets by mouth daily.  0  . Melatonin 3 MG CAPS Take 1 capsule by mouth at bedtime as needed (sleep).     . Multiple Vitamin (MULTIVITAMIN WITH MINERALS) TABS tablet Take 1 tablet by mouth daily. Centrum 50+    . nitroGLYCERIN (NITROSTAT) 0.4 MG SL tablet Place 1 tablet (0.4 mg total) under the tongue every 5 (five) minutes as needed for chest pain. 25 tablet 2  . nystatin (MYCOSTATIN/NYSTOP) powder Apply 1 g topically 4 (four) times daily.    Marland Kitchen  potassium chloride (K-DUR) 10 MEQ tablet Take 1 tablet (10 mEq total) by mouth daily. 90 tablet 3   No facility-administered medications prior to visit.      Allergies:   Atorvastatin; Aminophylline; Eggs or egg-derived products; Latex; Shellfish allergy; Ciprofloxacin; Codeine; Contrast media [iodinated diagnostic agents]; Gelnique [oxybutynin]; Penicillins; and Ranexa [ranolazine]   Social History   Social History  . Marital status: Married    Spouse name: N/A  . Number of children: N/A  . Years of education: N/A   Social History Main Topics  . Smoking status: Never Smoker  . Smokeless tobacco: Never Used  . Alcohol use No  . Drug use: No  . Sexual activity: Not Asked   Other Topics Concern  . None   Social History Narrative  . None     Family History:  The patient's family history includes Cancer in her mother; Heart disease in her brother, father, and  sister.   ROS:   Please see the history of present illness.    ROS All other systems reviewed and are negative.   PHYSICAL EXAM:   VS:  BP 139/72   Pulse 74   Ht 5' 5.5" (1.664 m)   Wt 207 lb 6.4 oz (94.1 kg)   BMI 33.99 kg/m     General: Alert, oriented x3, no distress.. Mildly obese Head: no evidence of trauma, PERRL, EOMI, no exophtalmos or lid lag, no myxedema, no xanthelasma; normal ears, nose and oropharynx Neck: normal jugular venous pulsations and no hepatojugular reflux; brisk carotid pulses without delay and no carotid bruits Chest: clear to auscultation, no signs of consolidation by percussion or palpation, normal fremitus, symmetrical and full respiratory excursions Cardiovascular: normal position and quality of the apical impulse, regular rhythm, normal first and second heart sounds, no murmurs, rubs or gallops Abdomen: no tenderness or distention, no masses by palpation, no abnormal pulsatility or arterial bruits, normal bowel sounds, no hepatosplenomegaly Extremities: no clubbing, cyanosis or edema; 2+ radial, ulnar and brachial pulses bilaterally; 2+ right femoral, posterior tibial and dorsalis pedis pulses; 2+ left femoral, posterior tibial and dorsalis pedis pulses; no subclavian or femoral bruits Neurological: grossly nonfocal   Wt Readings from Last 3 Encounters:  01/25/17 207 lb 6.4 oz (94.1 kg)  11/20/16 202 lb (91.6 kg)  11/15/16 202 lb (91.6 kg)      Studies/Labs Reviewed:   EKG:  EKG is not ordered today.  Recent Labs: 06/25/2016: ALT 16 11/15/2016: BUN 28; Creat 1.39; Magnesium 2.0; Potassium 3.9; Sodium 141   Lipid Panel    Component Value Date/Time   CHOL 187 06/27/2015 1336   TRIG 239 (H) 06/27/2015 1336   HDL 45 (L) 06/27/2015 1336   CHOLHDL 4.2 06/27/2015 1336   VLDL 48 (H) 06/27/2015 1336   LDLCALC 94 06/27/2015 1336   Labs 09/04/2016 total cholesterol 169, triglycerides 160, HDL 40, LDL 97, hemoglobin A1c 6.1%, glucose 104, creatinine  1.16, BUN 19, TSH 0.40  ASSESSMENT:    1. Coronary artery disease of native artery with stable angina pectoris, unspecified whether native or transplanted heart ([place])   2. Chronic diastolic CHF (congestive heart failure) ([place])   3. Essential hypertension   4. Class 1 obesity due to excess calories with serious comorbidity and body mass index (BMI) of 33.0 to 33.9 in adult   5. Dyslipidemia   6. CKD (chronic kidney disease), stage III   7. Bilateral leg pain      PLAN:  In order of problems listed above:  1. CAD: She does not have angina pectoris on the current regimen of long-acting nitrates and has a recent normal nuclear stress test 2. CHF: She has a narrow margin of compensation between heart failure and kidney failure. As far as I can tell she is euvolemic today. Functional status assessment is limited by orthopedic problems. 3. HTN:  Fairly well controlled. She should always use a blood pressure monitor with a large arm cuff in her right upper extremity. Appears to have left subclavian artery stenosis 4. Obesity: Weight loss would be beneficial and is again recommended. As she cannot be physically active this will have to be achieved with calorie restriction 5. Hyperlipidemia: fair lipid profile, but target LDL<70. Intolerant to statins. Discussed PCS K9 inhibitors, seems to be financially prohibitive. 6. CKD: We may have to tolerate some degree of worsening renal function to prevent severe symptomatic heart failure. 7. Leg pain: I think she is describing Lumbar spine disease, not claudication   Medication Adjustments/Labs and Tests Ordered: Current medicines are reviewed at length with the patient today.  Concerns regarding medicines are outlined above.  Medication changes, Labs and Tests ordered today are listed in the Patient Instructions below. Patient Instructions  Dr Sallyanne Kuster recommends that you schedule a follow-up appointment in 12 months. You will receive a reminder letter  in the mail two months in advance. If you don't receive a letter, please call our office to schedule the follow-up appointment.  If you need a refill on your cardiac medications before your next appointment, please call your pharmacy.      Signed, Sanda Klein, MD  01/27/2017 4:25 PM    [facility] [address], [place], [place]  [address] Phone: [phone]; Fax: [phone]

## 2017-01-27 DIAGNOSIS — M79604 Pain in right leg: Secondary | ICD-10-CM | POA: Insufficient documentation

## 2017-01-27 DIAGNOSIS — M79605 Pain in left leg: Secondary | ICD-10-CM

## 2017-01-30 DIAGNOSIS — L820 Inflamed seborrheic keratosis: Secondary | ICD-10-CM | POA: Diagnosis not present

## 2017-02-12 DIAGNOSIS — Z1211 Encounter for screening for malignant neoplasm of colon: Secondary | ICD-10-CM | POA: Diagnosis not present

## 2017-02-12 DIAGNOSIS — K625 Hemorrhage of anus and rectum: Secondary | ICD-10-CM | POA: Diagnosis not present

## 2017-02-12 DIAGNOSIS — Z86010 Personal history of colonic polyps: Secondary | ICD-10-CM | POA: Diagnosis not present

## 2017-02-12 DIAGNOSIS — S83411A Sprain of medial collateral ligament of right knee, initial encounter: Secondary | ICD-10-CM | POA: Diagnosis not present

## 2017-02-12 DIAGNOSIS — M25561 Pain in right knee: Secondary | ICD-10-CM | POA: Diagnosis not present

## 2017-02-12 DIAGNOSIS — M1711 Unilateral primary osteoarthritis, right knee: Secondary | ICD-10-CM | POA: Diagnosis not present

## 2017-02-13 ENCOUNTER — Other Ambulatory Visit: Payer: Self-pay | Admitting: Cardiology

## 2017-02-13 ENCOUNTER — Other Ambulatory Visit: Payer: Self-pay | Admitting: Gastroenterology

## 2017-02-13 NOTE — Telephone Encounter (Signed)
Rx has been sent to the pharmacy electronically. ° °

## 2017-02-26 DIAGNOSIS — M25561 Pain in right knee: Secondary | ICD-10-CM | POA: Diagnosis not present

## 2017-02-26 DIAGNOSIS — S83411A Sprain of medial collateral ligament of right knee, initial encounter: Secondary | ICD-10-CM | POA: Diagnosis not present

## 2017-03-05 ENCOUNTER — Encounter (HOSPITAL_COMMUNITY): Payer: Self-pay | Admitting: *Deleted

## 2017-03-05 DIAGNOSIS — M25561 Pain in right knee: Secondary | ICD-10-CM | POA: Diagnosis not present

## 2017-03-05 DIAGNOSIS — M1711 Unilateral primary osteoarthritis, right knee: Secondary | ICD-10-CM | POA: Diagnosis not present

## 2017-03-06 NOTE — Anesthesia Preprocedure Evaluation (Addendum)
Anesthesia Evaluation  Patient identified by MRN, date of birth, ID band Patient awake    Reviewed: Allergy & Precautions, NPO status , Patient's Chart, lab work & pertinent test results  History of Anesthesia Complications Negative for: history of anesthetic complications  Airway Mallampati: II  TM Distance: >3 FB Neck ROM: Full    Dental  (+) Edentulous Upper, Edentulous Lower, Dental Advisory Given   Pulmonary neg pulmonary ROS,    Pulmonary exam normal        Cardiovascular hypertension, + CAD, + Cardiac Stents, + Peripheral Vascular Disease and +CHF  Normal cardiovascular exam  Notes Recorded by Pixie Casino, MD on 11/22/2016 at 8:12 AM EST Negative stress test with normal LV function.   Neuro/Psych CVA, No Residual Symptoms negative psych ROS   GI/Hepatic negative GI ROS, Neg liver ROS,   Endo/Other  Hypothyroidism   Renal/GU Renal Insufficiency     Musculoskeletal negative musculoskeletal ROS (+)   Abdominal   Peds  Hematology negative hematology ROS (+)   Anesthesia Other Findings Day of surgery medications reviewed with the patient.  Reproductive/Obstetrics                           Anesthesia Physical Anesthesia Plan  ASA: III  Anesthesia Plan: MAC   Post-op Pain Management:    Induction:   PONV Risk Score and Plan: 2 and Ondansetron and Propofol  Airway Management Planned: Natural Airway and Simple Face Mask  Additional Equipment:   Intra-op Plan:   Post-operative Plan:   Informed Consent: I have reviewed the patients History and Physical, chart, labs and discussed the procedure including the risks, benefits and alternatives for the proposed anesthesia with the patient or authorized representative who has indicated his/her understanding and acceptance.   Dental advisory given  Plan Discussed with: Anesthesiologist and CRNA  Anesthesia Plan Comments:         Anesthesia Quick Evaluation

## 2017-03-07 ENCOUNTER — Ambulatory Visit (HOSPITAL_COMMUNITY)
Admission: RE | Admit: 2017-03-07 | Discharge: 2017-03-07 | Disposition: A | Discharge status: Home / Self Care | Payer: PPO | Source: Ambulatory Visit | Attending: Gastroenterology | Admitting: Gastroenterology

## 2017-03-07 ENCOUNTER — Ambulatory Visit (HOSPITAL_COMMUNITY): Payer: PPO | Admitting: Anesthesiology

## 2017-03-07 ENCOUNTER — Encounter (HOSPITAL_COMMUNITY)
Admission: RE | Disposition: A | Discharge status: Home / Self Care | Payer: Self-pay | Source: Ambulatory Visit | Attending: Gastroenterology

## 2017-03-07 ENCOUNTER — Encounter (HOSPITAL_COMMUNITY): Payer: Self-pay

## 2017-03-07 DIAGNOSIS — E785 Hyperlipidemia, unspecified: Secondary | ICD-10-CM | POA: Diagnosis not present

## 2017-03-07 DIAGNOSIS — Z1211 Encounter for screening for malignant neoplasm of colon: Secondary | ICD-10-CM | POA: Diagnosis not present

## 2017-03-07 DIAGNOSIS — Z8673 Personal history of transient ischemic attack (TIA), and cerebral infarction without residual deficits: Secondary | ICD-10-CM | POA: Diagnosis not present

## 2017-03-07 DIAGNOSIS — Z86010 Personal history of colonic polyps: Secondary | ICD-10-CM | POA: Diagnosis not present

## 2017-03-07 DIAGNOSIS — K635 Polyp of colon: Secondary | ICD-10-CM | POA: Diagnosis not present

## 2017-03-07 DIAGNOSIS — N183 Chronic kidney disease, stage 3 (moderate): Secondary | ICD-10-CM | POA: Diagnosis not present

## 2017-03-07 DIAGNOSIS — I129 Hypertensive chronic kidney disease with stage 1 through stage 4 chronic kidney disease, or unspecified chronic kidney disease: Secondary | ICD-10-CM | POA: Insufficient documentation

## 2017-03-07 DIAGNOSIS — E039 Hypothyroidism, unspecified: Secondary | ICD-10-CM | POA: Insufficient documentation

## 2017-03-07 DIAGNOSIS — Z955 Presence of coronary angioplasty implant and graft: Secondary | ICD-10-CM | POA: Diagnosis not present

## 2017-03-07 DIAGNOSIS — I2510 Atherosclerotic heart disease of native coronary artery without angina pectoris: Secondary | ICD-10-CM | POA: Diagnosis not present

## 2017-03-07 DIAGNOSIS — Z7982 Long term (current) use of aspirin: Secondary | ICD-10-CM | POA: Diagnosis not present

## 2017-03-07 DIAGNOSIS — I739 Peripheral vascular disease, unspecified: Secondary | ICD-10-CM | POA: Insufficient documentation

## 2017-03-07 DIAGNOSIS — Z79899 Other long term (current) drug therapy: Secondary | ICD-10-CM | POA: Insufficient documentation

## 2017-03-07 DIAGNOSIS — D122 Benign neoplasm of ascending colon: Secondary | ICD-10-CM | POA: Diagnosis not present

## 2017-03-07 HISTORY — DX: Unspecified urinary incontinence: R32

## 2017-03-07 HISTORY — PX: COLONOSCOPY WITH PROPOFOL: SHX5780

## 2017-03-07 HISTORY — DX: Malignant (primary) neoplasm, unspecified: C80.1

## 2017-03-07 SURGERY — COLONOSCOPY WITH PROPOFOL
Anesthesia: Monitor Anesthesia Care

## 2017-03-07 MED ORDER — SODIUM CHLORIDE 0.9 % IV SOLN: INTRAVENOUS | Status: DC

## 2017-03-07 MED ORDER — ONDANSETRON HCL 4 MG/2ML IJ SOLN
INTRAMUSCULAR | Status: DC | PRN
  Administered 2017-03-07: 4 mg via INTRAVENOUS

## 2017-03-07 MED ORDER — ONDANSETRON HCL 4 MG/2ML IJ SOLN
INTRAMUSCULAR | Status: AC
  Filled 2017-03-07: qty 2

## 2017-03-07 MED ORDER — PROPOFOL 10 MG/ML IV BOLUS
INTRAVENOUS | Status: AC
  Filled 2017-03-07: qty 40

## 2017-03-07 MED ORDER — PROPOFOL 10 MG/ML IV BOLUS
INTRAVENOUS | Status: DC | PRN
  Administered 2017-03-07 (×2): 20 mg via INTRAVENOUS
  Administered 2017-03-07: 10 mg via INTRAVENOUS

## 2017-03-07 MED ORDER — LACTATED RINGERS IV SOLN
INTRAVENOUS | Status: DC
  Administered 2017-03-07: 1000 mL via INTRAVENOUS

## 2017-03-07 MED ORDER — PROPOFOL 500 MG/50ML IV EMUL
INTRAVENOUS | Status: DC | PRN
  Administered 2017-03-07: 100 ug/kg/min via INTRAVENOUS

## 2017-03-07 SURGICAL SUPPLY — 21 items

## 2017-03-07 NOTE — Discharge Instructions (Signed)

## 2017-03-07 NOTE — Transfer of Care (Signed)
Immediate Anesthesia Transfer of Care Note  Patient: Jill Preston  Procedure(s) Performed: Procedure(s): COLONOSCOPY WITH PROPOFOL (N/A)  Patient Location: PACU and Endoscopy Unit  Anesthesia Type:MAC  Level of Consciousness: awake, sedated and responds to stimulation  Airway & Oxygen Therapy: Patient Spontanous Breathing and Patient connected to face mask oxygen  Post-op Assessment: Report given to RN and Post -op Vital signs reviewed and stable  Post vital signs: Reviewed and stable  Last Vitals:  Vitals:   03/07/17 0630  BP: 139/65  Pulse: 71  Resp: 13  Temp: 36.7 C    Last Pain:  Vitals:   03/07/17 0630  TempSrc: Oral         Complications: No apparent anesthesia complications

## 2017-03-07 NOTE — Op Note (Signed)
[facility] Patient Name: Jill Preston Procedure Date: 03/07/2017 MRN: [id number] Attending MD: Juanita Craver , MD Date of Birth: 1933/04/02 CSN: [id number] Age: 83 Admit Type: Outpatient Procedure:                Colonoscopy with cold biopsy x 1. Indications:              CRC screening for colorectal malignant neoplasm. Providers:                Juanita Craver, MD, Laverta Baltimore RN, RN, Elspeth Cho Tech., Technician, [address]. [name] Resides CRNA,                            CRNA Referring MD:             Merrilee Seashore, MD Medicines:                Monitored Anesthesia Care Complications:            No immediate complications. Estimated Blood Loss:     Estimated blood loss was minimal. Procedure:                Pre-anesthesia assessment: - Prior to the                            procedure, a history and physical was performed,                            and patient medications and allergies were                            reviewed. The patient's tolerance of previous                            anesthesia was also reviewed. The risks and                            benefits of the procedure and the sedation options                            and risks were discussed with the patient. All                            questions were answered, and informed consent was                            obtained. Prior anticoagulants: The patient has                            taken aspirin, last dose was 1 day prior to                            procedure. ASA Grade assessment: III - A patient  with severe systemic disease. After reviewing the                            risks and benefits, the patient was deemed in                            satisfactory condition to undergo the procedure.                            After obtaining informed consent, the colonoscope                            was passed under direct vision.  Throughout the                            procedure, the patient's blood pressure, pulse, and                            oxygen saturations were monitored continuously. The                            Colonoscope was introduced through the anus and                            advanced to the the cecum, identified by                            appendiceal orifice and ileocecal valve. The                            colonoscopy was performed without difficulty. The                            patient tolerated the procedure well. The quality                            of the bowel preparation was adequate. The                            ileocecal valve, the appendiceal orifice and the                            rectum were photographed. The bowel preparation                            used was GoLYTELY. Scope In: 7:25:31 AM Scope Out: 7:43:08 AM Scope Withdrawal Time: 0 hours 10 minutes 13 seconds  Total Procedure Duration: 0 hours 17 minutes 37 seconds  Findings:      A small sessile polyp was found in the mid-ascending colon-this was       removed by cold biopsy x 1.      The exam was otherwise without abnormality on direct and retroflexion       views. Impression:               -  One small sessile polyp in the mid-ascending                            colon-removed by cold biopsy x 1.                           - The examination was otherwise normal on direct                            and retroflexion views. Moderate Sedation:      MAC given. Recommendation:           - High fiber diet with augmented water consumption                            daily.                           - Continue present medications.                           - Await pathology results.                           - Repeat colonoscopy is not planned at this time                            due to the patient's age and co-morbidities.                           - Return to GI office PRN.                           - If  the patient has any abnormal GI symptoms in                            the interim, she has been advised to call the                            office ASAP for further recommendations. Procedure Code(s):        --- Professional ---                           [phone], Colonoscopy, flexible; with biopsy, single                            or multiple Diagnosis Code(s):        --- Professional ---                           D12.2, Benign neoplasm of ascending colon                           Z86.010, Personal history of colonic polyps  Z12.11, Encounter for screening for malignant                            neoplasm of colon CPT copyright 2016 [employer]. All rights reserved. The codes documented in this report are preliminary and upon coder review may  be revised to meet current compliance requirements. Juanita Craver, MD Juanita Craver, MD 03/07/2017 7:52:20 AM This report has been signed electronically. Number of Addenda: 0

## 2017-03-07 NOTE — Anesthesia Postprocedure Evaluation (Signed)
Anesthesia Post Note  Patient: Jill Preston  Procedure(s) Performed: Procedure(s) (LRB): COLONOSCOPY WITH PROPOFOL (N/A)     Patient location during evaluation: Endoscopy Anesthesia Type: MAC Level of consciousness: awake and alert Pain management: pain level controlled Vital Signs Assessment: post-procedure vital signs reviewed and stable Respiratory status: spontaneous breathing and respiratory function stable Cardiovascular status: stable Anesthetic complications: no    Last Vitals:  Vitals:   03/07/17 0805 03/07/17 0820  BP: 107/71 (!) 117/30  Pulse: 62   Resp: 13 16  Temp:      Last Pain:  Vitals:   03/07/17 0750  TempSrc: Oral                 Ab Leaming DANIEL

## 2017-03-07 NOTE — H&P (Signed)
Jill Preston is an 83 y.o. female.   Chief Complaint: Colorectal cancer screening. HPI: 83 year old white female here for a screening colonoscopy. See office notes for details.  Past Medical History:  Diagnosis Date  . Arthritis    right knee  . Cancer [facility]) yrs ago   skin cancer removed from face  . Chronic diastolic CHF (congestive heart failure) ([place])   . Chronic venous insufficiency    LEA VENOUS, 10/17/2011 - mild reflux in bilateral common femoral veins  . CKD (chronic kidney disease), stage III   . Coronary artery disease    a. s/p PCI/BMS to prox LAD and balloon angioplasty to mLAD with suboptimal result in 2000. b. Abnl nuc 2012, cath 09/2011 - showed that the overall territory of potential ischemia was small and attributable to a moderately diseased small second diagonal artery. Med rx.  . Hyperlipidemia   . Hypertension   . Hypertensive heart disease   . Hypothyroidism   . Obesity   . Stroke [facility])    pt. states she had "light stroke" in sept. 1980  . Urinary incontinence    Past Surgical History:  Procedure Laterality Date  . ABDOMINAL HYSTERECTOMY  1970's   complete  . BACK SURGERY  07/26/2016   cervical neck surgery, [facility]  . BLADDER SURGERY  2010   WITH MESH  bladder tach  . bunion removal surgery Bilateral 15 yrs ago  . CARDIAC CATHETERIZATION Left 09/25/2011   Medical management  . CARDIAC CATHETERIZATION Left 03/25/2001   Normal LV function, LAD residual narrowing of less than 10%, normal ramus intermediate, circumflex, and RCA,   . CARDIAC CATHETERIZATION  09/04/1999   LAD, 3x8mm Tetra stent resulting in a reduction of the 80% stenosis to 0% residual  . CARDIAC CATHETERIZATION N/A 01/26/2015   Procedure: Right/Left Heart Cath and Coronary Angiography;  Surgeon: Troy Sine, MD;  Location: [place] [facility];  Service: Cardiovascular;  Laterality: N/A;  . CARDIAC CATHETERIZATION N/A 01/27/2015   Procedure: Intravascular Pressure  Wire/FFR Study;  Surgeon: Burnell Blanks, MD;  Location: [place] [facility];  Service: Cardiovascular;  Laterality: N/A;  . CARDIAC CATHETERIZATION N/A 01/27/2015   Procedure: Right Heart Cath;  Surgeon: Burnell Blanks, MD;  Location: [place] [facility];  Service: Cardiovascular;  Laterality: N/A;  . CHOLECYSTECTOMY    . CORONARY STENT PLACEMENT     LAD x 1  . ganglion cyst removal  yrs ago   x 2  . LEFT HEART CATHETERIZATION WITH CORONARY ANGIOGRAM N/A 09/25/2011   Procedure: LEFT HEART CATHETERIZATION WITH CORONARY ANGIOGRAM;  Surgeon: Sanda Klein, MD;  Location: [place] [facility];  Service: Cardiovascular;  Laterality: N/A;  . multiple bladder surgeries to remove mesh    . ROTATOR CUFF REPAIR Right   . stent to groin  08/2014   left leg   Family History  Problem Relation Age of Onset  . Cancer Mother   . Heart disease Father   . Heart disease Sister   . Heart disease Brother    Social History:  reports that she has never smoked. She has never used smokeless tobacco. She reports that she does not drink alcohol or use drugs.  Allergies:  Allergies  Allergen Reactions  . Atorvastatin Anaphylaxis  . Penicillins Anaphylaxis and Hives    Has patient had a PCN reaction causing immediate rash, facial/tongue/throat swelling, SOB or lightheadedness with hypotension: Yes Has patient had a PCN reaction causing severe rash involving  mucus membranes or skin necrosis: Yes Has patient had a PCN reaction that required hospitalization: Yes Has patient had a PCN reaction occurring within the last 10 years: Yes If all of the above answers are "NO", then may proceed with Cephalosporin use.   . Aminophylline Itching, Swelling and Rash    Pt experienced burning urination, itching, redness/rash, and swelling of genitals after given this medication via IV push.  . Latex Other (See Comments)    Causes blisters  . Shellfish Allergy Nausea And Vomiting    Severe nausea and vomiting  .  Adhesive [Tape] Rash    Paper tape is ok  . Ciprofloxacin Hives  . Codeine Nausea And Vomiting  . Contrast Media [Iodinated Diagnostic Agents] Swelling and Other (See Comments)    Flushing  . Gelnique [Oxybutynin] Other (See Comments)    Causes blisters  . Ranexa [Ranolazine] Other (See Comments)    Constipation     Medications Prior to Admission  Medication Sig Dispense Refill  . Apoaequorin (PREVAGEN) 10 MG CAPS Take 10 mg by mouth daily.    Marland Kitchen aspirin EC 81 MG tablet Take 1 tablet (81 mg total) by mouth daily. 90 tablet 3  . Calcium Carbonate-Vitamin D (CALCIUM 600+D PO) Take 1 tablet by mouth daily.    . diphenhydrAMINE (BENADRYL) 25 MG tablet Take 25 mg by mouth at bedtime as needed for sleep.    Marland Kitchen docusate sodium (COLACE) 100 MG capsule Take 100 mg by mouth daily.    . fenofibrate 160 MG tablet Take 160 mg by mouth daily.   0  . Homeopathic Products (LEG CRAMP RELIEF) SUBL Place 2 tablets under the tongue at bedtime as needed (cramps).    . isosorbide mononitrate (IMDUR) 30 MG 24 hr tablet TAKE 2 TABLETS BY MOUTH 2 TIMES DAILY 120 tablet 11  . levothyroxine (SYNTHROID, LEVOTHROID) 137 MCG tablet Take 137 mcg by mouth daily before breakfast.    . losartan-hydrochlorothiazide (HYZAAR) 100-25 MG tablet Take 0.5 tablets by mouth daily.  0  . Menthol-Methyl Salicylate (MUSCLE RUB) 10-15 % CREA Apply 1 application topically as needed for muscle pain.    . Multiple Vitamin (MULTIVITAMIN WITH MINERALS) TABS tablet Take 1 tablet by mouth daily. Centrum 50+    . nystatin (MYCOSTATIN/NYSTOP) powder Apply 1 g topically daily as needed (rash).     . pravastatin (PRAVACHOL) 80 MG tablet Take 80 mg by mouth at bedtime.    . vitamin C (ASCORBIC ACID) 500 MG tablet Take 1,000 mg by mouth daily.    . nitroGLYCERIN (NITROSTAT) 0.4 MG SL tablet Place 1 tablet (0.4 mg total) under the tongue every 5 (five) minutes as needed for chest pain. 25 tablet 2    No results found for this or any previous  visit (from the past 48 hour(s)). No results found.  Review of Systems  Constitutional: Negative.   HENT: Negative.   Eyes: Negative.   Respiratory: Negative.   Cardiovascular: Negative.   Gastrointestinal: Positive for blood in stool and constipation. Negative for abdominal pain, diarrhea, heartburn, melena, nausea and vomiting.  Genitourinary: Negative.   Skin: Negative.   Neurological: Negative.   Endo/Heme/Allergies: Negative.   Psychiatric/Behavioral: Negative.    Blood pressure 139/65, pulse 71, temperature 98 F (36.7 C), temperature source Oral, resp. rate 13, height 5' 5.5" (1.664 m), weight 91.6 kg (202 lb), SpO2 92 %. Physical Exam  Constitutional: She is oriented to person, place, and time. She appears well-developed and well-nourished.  HENT:  Head:  Normocephalic and atraumatic.  Eyes: Conjunctivae and EOM are normal. Pupils are equal, round, and reactive to light.  Neck: Normal range of motion. Neck supple.  Cardiovascular: Normal rate and regular rhythm.   Respiratory: Effort normal and breath sounds normal.  GI: Soft. Bowel sounds are normal.  Musculoskeletal: Normal range of motion.  Neurological: She is alert and oriented to person, place, and time.  Skin: Skin is warm and dry.  Psychiatric: She has a normal mood and affect. Her behavior is normal. Judgment and thought content normal.   Assessment/Plan Colorectal cancer screening/personal history of colonic polyps: proceed with a colonoscopy at this time.  Kerwin Augustus, MD 03/07/2017, 7:10 AM

## 2017-03-08 ENCOUNTER — Encounter (HOSPITAL_COMMUNITY): Payer: Self-pay | Admitting: Gastroenterology

## 2017-03-13 DIAGNOSIS — M1711 Unilateral primary osteoarthritis, right knee: Secondary | ICD-10-CM | POA: Diagnosis not present

## 2017-03-13 DIAGNOSIS — M25561 Pain in right knee: Secondary | ICD-10-CM | POA: Diagnosis not present

## 2017-03-19 ENCOUNTER — Other Ambulatory Visit: Payer: Self-pay | Admitting: Rheumatology

## 2017-03-19 DIAGNOSIS — M25561 Pain in right knee: Secondary | ICD-10-CM

## 2017-03-21 ENCOUNTER — Telehealth: Payer: Self-pay | Admitting: Cardiovascular Disease

## 2017-03-21 NOTE — Telephone Encounter (Signed)
S/w pt she states that she has been having SOB both at rest and exertional she will sit down and it will take her 10-15 min to recover. She states that she also has episode of chest discomfort she states that she would not call it chest pain just discomfort. She states that it woke her up at 4am and her BP at that time it was 174/102 HR 59 and after it stopped it was 147/71 HR 58. Still a little high but better, she states that this is how it has been running. Denies nausea or dizziness. Appt made for pt 7-11, informed pt that she should go to the ER if sx worsen before appt.

## 2017-03-21 NOTE — Telephone Encounter (Signed)
Pt c/o Shortness Of Breath: STAT if SOB developed within the last 24 hours or pt is noticeably SOB on the phone  1. Are you currently SOB (can you hear that pt is SOB on the phone)? No  2. How long have you been experiencing SOB? Quite some time   3. Are you SOB when sitting or when up moving around? Moving around, "anything with arms get short-winded"  4. Are you currently experiencing any other symptoms? Chest discomfort

## 2017-03-26 DIAGNOSIS — K602 Anal fissure, unspecified: Secondary | ICD-10-CM | POA: Diagnosis not present

## 2017-03-26 DIAGNOSIS — K625 Hemorrhage of anus and rectum: Secondary | ICD-10-CM | POA: Diagnosis not present

## 2017-03-26 DIAGNOSIS — Z86010 Personal history of colonic polyps: Secondary | ICD-10-CM | POA: Diagnosis not present

## 2017-03-26 DIAGNOSIS — B3789 Other sites of candidiasis: Secondary | ICD-10-CM | POA: Diagnosis not present

## 2017-03-27 ENCOUNTER — Ambulatory Visit (INDEPENDENT_AMBULATORY_CARE_PROVIDER_SITE_OTHER): Payer: PPO | Admitting: Physician Assistant

## 2017-03-27 ENCOUNTER — Encounter: Payer: Self-pay | Admitting: Physician Assistant

## 2017-03-27 VITALS — BP 147/83 | HR 65 | Ht 65.5 in | Wt 210.0 lb

## 2017-03-27 DIAGNOSIS — I25110 Atherosclerotic heart disease of native coronary artery with unstable angina pectoris: Secondary | ICD-10-CM

## 2017-03-27 DIAGNOSIS — E785 Hyperlipidemia, unspecified: Secondary | ICD-10-CM | POA: Diagnosis not present

## 2017-03-27 DIAGNOSIS — I2510 Atherosclerotic heart disease of native coronary artery without angina pectoris: Secondary | ICD-10-CM | POA: Diagnosis not present

## 2017-03-27 DIAGNOSIS — I10 Essential (primary) hypertension: Secondary | ICD-10-CM

## 2017-03-27 DIAGNOSIS — I5032 Chronic diastolic (congestive) heart failure: Secondary | ICD-10-CM | POA: Diagnosis not present

## 2017-03-27 DIAGNOSIS — E039 Hypothyroidism, unspecified: Secondary | ICD-10-CM | POA: Diagnosis not present

## 2017-03-27 DIAGNOSIS — E782 Mixed hyperlipidemia: Secondary | ICD-10-CM | POA: Diagnosis not present

## 2017-03-27 LAB — CBC
Hematocrit: 43.1 % (ref 34.0–46.6)
Hemoglobin: 14.6 g/dL (ref 11.1–15.9)
MCH: 32.4 pg (ref 26.6–33.0)
MCHC: 33.9 g/dL (ref 31.5–35.7)
MCV: 96 fL (ref 79–97)
Platelets: 219 10*3/uL (ref 150–379)
RBC: 4.51 x10E6/uL (ref 3.77–5.28)
RDW: 12.9 % (ref 12.3–15.4)
WBC: 6 10*3/uL (ref 3.4–10.8)

## 2017-03-27 LAB — BASIC METABOLIC PANEL
BUN/Creatinine Ratio: 31 — ABNORMAL HIGH (ref 12–28)
BUN: 28 mg/dL — ABNORMAL HIGH (ref 8–27)
CO2: 24 mmol/L (ref 20–29)
Calcium: 9.9 mg/dL (ref 8.7–10.3)
Chloride: 100 mmol/L (ref 96–106)
Creatinine, Ser: 0.91 mg/dL (ref 0.57–1.00)
GFR calc Af Amer: 68 mL/min/{1.73_m2} (ref >59)
GFR calc non Af Amer: 59 mL/min/{1.73_m2} — ABNORMAL LOW (ref >59)
Glucose: 102 mg/dL — ABNORMAL HIGH (ref 65–99)
Potassium: 4.1 mmol/L (ref 3.5–5.2)
Sodium: 141 mmol/L (ref 134–144)

## 2017-03-27 LAB — PROTIME-INR
INR: 1 (ref 0.8–1.2)
Prothrombin Time: 10.9 s (ref 9.1–12.0)

## 2017-03-27 MED ORDER — PREDNISONE 50 MG PO TABS: ORAL_TABLET | ORAL | 0 refills | Status: DC

## 2017-03-27 NOTE — Progress Notes (Signed)
Cardiology Office Note    Date:  03/28/2017   ID:  Jill Preston, DOB [birth year], MRN [id number]  PCP:  Merrilee Seashore, MD  Cardiologist:  Dr. Sallyanne Kuster  Chief Complaint  Patient presents with  . Follow-up    seen for Dr. Sallyanne Kuster  . Chest Pain    last recent chest pain last monday.     History of Present Illness:  Jill Preston is a 83 y.o. female with PMH of CAD, chronic venous insufficiency, hyperlipidemia, hypertension, chronic diastolic heart failure, hypothyroidism, stage III CKD, and history of CVA. In December 2015, she did develop acute left iliofemoral DVT with anticoagulation complicated by retroperitoneal hematoma and hemorrhagic shock, requiring placement of inferior vena cava filter. The filter was removed in June 2016. She had a history of chronic diastolic heart failure with very narrow therapeutic range between acute kidney injury versus fluid overload. She was eventually placed on sliding-scale diuretic with no diuretic if her weight is under 205 pounds and double the dose of diuretic when she exceeded 210 pounds. Her dry weight seems to be around 204-205 pounds. This seems to have worked quite well for her. She has a long history of coronary artery disease and underwent proximal LAD bare metal stenting and balloon angioplasty of mid LAD in 2013. She has had persistent ischemia in the territory of diagonal artery on nuclear stress test over the last few years. Cardiac cath performed in May 2016 showed a widely patent LAD stent, 60% ostial left circumflex stenosis, 20-30% RCA stenosis. There was concern of 50% LM disease, but FFR was normal. There was CT surgery consult by Dr. Roxy Horseman regarding her moderate left main disease, after reviewing her chart, it was felt she is likely high risk surgical candidate, therefore medical therapy was recommended. She has not had any further angina symptom. She has good angina response to Ranexa but developed severe constipation  could not tolerate the medication. She is also intolerant to beta blocker due to baseline bradycardia. She has preserved ejection fraction with episodes of acute exacerbation of diastolic heart failure attributed to pulmonary hypertensive disease. Myoview obtained in February 2017 was low risk without ischemia. She was seen by Dr. Sallyanne Kuster on 11/15/2016, there were EKG changes and some chest pain. Myoview was repeated on 11/20/2016 which showed EF 62%, T wave inversion all 4 mm noted during stress in the inferolateral leads, however no perfusion abnormality, overall considered low risk study.  She presents today for cardiology office visit, since she was last seen, she continued to have worsening shortness of breath with exertion and chest tightness. She has not found any significant relief. She denies any lower extremity edema, orthopnea or paroxysmal nocturnal dyspnea. I did discuss her case with her primary cardiologist Dr. Sallyanne Kuster, she had known left main disease, we will arrange for outpatient cardiac catheterization for more definitive diagnosis. She says she did have recent colonoscopy, afterward she had some bright red blood per rectum, she is taking medication for it. Given her recent GI bleed, I will delay her cardiac catheterization to later next week.   Past Medical History:  Diagnosis Date  . Arthritis    right knee  . Cancer [facility]) yrs ago   skin cancer removed from face  . Chronic diastolic CHF (congestive heart failure) ([place])   . Chronic venous insufficiency    LEA VENOUS, 10/17/2011 - mild reflux in bilateral common femoral veins  . CKD (chronic kidney disease), stage III   . Coronary artery  disease    a. s/p PCI/BMS to prox LAD and balloon angioplasty to mLAD with suboptimal result in 2000. b. Abnl nuc 2012, cath 09/2011 - showed that the overall territory of potential ischemia was small and attributable to a moderately diseased small second diagonal artery. Med rx.  . Hyperlipidemia     . Hypertension   . Hypertensive heart disease   . Hypothyroidism   . Obesity   . Stroke [facility])    pt. states she had "light stroke" in sept. 1980  . Urinary incontinence     Past Surgical History:  Procedure Laterality Date  . ABDOMINAL HYSTERECTOMY  1970's   complete  . BACK SURGERY  07/26/2016   cervical neck surgery, [facility]  . BLADDER SURGERY  2010   WITH MESH  bladder tach  . bunion removal surgery Bilateral 15 yrs ago  . CARDIAC CATHETERIZATION Left 09/25/2011   Medical management  . CARDIAC CATHETERIZATION Left 03/25/2001   Normal LV function, LAD residual narrowing of less than 10%, normal ramus intermediate, circumflex, and RCA,   . CARDIAC CATHETERIZATION  09/04/1999   LAD, 3x42mm Tetra stent resulting in a reduction of the 80% stenosis to 0% residual  . CARDIAC CATHETERIZATION N/A 01/26/2015   Procedure: Right/Left Heart Cath and Coronary Angiography;  Surgeon: Troy Sine, MD;  Location: [place] CV LAB;  Service: Cardiovascular;  Laterality: N/A;  . CARDIAC CATHETERIZATION N/A 01/27/2015   Procedure: Intravascular Pressure Wire/FFR Study;  Surgeon: Burnell Blanks, MD;  Location: [place] CV LAB;  Service: Cardiovascular;  Laterality: N/A;  . CARDIAC CATHETERIZATION N/A 01/27/2015   Procedure: Right Heart Cath;  Surgeon: Burnell Blanks, MD;  Location: [place] CV LAB;  Service: Cardiovascular;  Laterality: N/A;  . CHOLECYSTECTOMY    . COLONOSCOPY WITH PROPOFOL N/A 03/07/2017   Procedure: COLONOSCOPY WITH PROPOFOL;  Surgeon: Juanita Craver, MD;  Location: [facility] ENDOSCOPY;  Service: Endoscopy;  Laterality: N/A;  . CORONARY STENT PLACEMENT     LAD x 1  . ganglion cyst removal  yrs ago   x 2  . LEFT HEART CATHETERIZATION WITH CORONARY ANGIOGRAM N/A 09/25/2011   Procedure: LEFT HEART CATHETERIZATION WITH CORONARY ANGIOGRAM;  Surgeon: Sanda Klein, MD;  Location: [place] CATH LAB;  Service: Cardiovascular;  Laterality: N/A;  . multiple bladder  surgeries to remove mesh    . ROTATOR CUFF REPAIR Right   . stent to groin  08/2014   left leg    Current Medications: Outpatient Medications Prior to Visit  Medication Sig Dispense Refill  . Apoaequorin (PREVAGEN) 10 MG CAPS Take 10 mg by mouth daily.    Marland Kitchen aspirin EC 81 MG tablet Take 1 tablet (81 mg total) by mouth daily. 90 tablet 3  . Calcium Carbonate-Vitamin D (CALCIUM 600+D PO) Take 1 tablet by mouth daily.    . diphenhydrAMINE (BENADRYL) 25 MG tablet Take 25 mg by mouth at bedtime as needed for sleep.    Marland Kitchen docusate sodium (COLACE) 100 MG capsule Take 100 mg by mouth daily.    . fenofibrate 160 MG tablet Take 160 mg by mouth daily.   0  . Homeopathic Products (LEG CRAMP RELIEF) SUBL Place 2 tablets under the tongue at bedtime as needed (cramps).    . isosorbide mononitrate (IMDUR) 30 MG 24 hr tablet TAKE 2 TABLETS BY MOUTH 2 TIMES DAILY 120 tablet 11  . levothyroxine (SYNTHROID, LEVOTHROID) 137 MCG tablet Take 137 mcg by mouth daily before breakfast.    .  losartan-hydrochlorothiazide (HYZAAR) 100-25 MG tablet Take 0.5 tablets by mouth daily.  0  . Menthol-Methyl Salicylate (MUSCLE RUB) 10-15 % CREA Apply 1 application topically as needed for muscle pain.    . Multiple Vitamin (MULTIVITAMIN WITH MINERALS) TABS tablet Take 1 tablet by mouth daily. Centrum 50+    . nitroGLYCERIN (NITROSTAT) 0.4 MG SL tablet Place 1 tablet (0.4 mg total) under the tongue every 5 (five) minutes as needed for chest pain. 25 tablet 2  . nystatin (MYCOSTATIN/NYSTOP) powder Apply 1 g topically daily as needed (rash).     . pravastatin (PRAVACHOL) 80 MG tablet Take 80 mg by mouth at bedtime.    . vitamin C (ASCORBIC ACID) 500 MG tablet Take 1,000 mg by mouth daily.     No facility-administered medications prior to visit.      Allergies:   Atorvastatin; Penicillins; Aminophylline; Latex; Shellfish allergy; Adhesive [tape]; Ciprofloxacin; Codeine; Contrast media [iodinated diagnostic agents]; Gelnique  [oxybutynin]; and Ranexa [ranolazine]   Social History   Social History  . Marital status: Married    Spouse name: N/A  . Number of children: N/A  . Years of education: N/A   Social History Main Topics  . Smoking status: Never Smoker  . Smokeless tobacco: Never Used  . Alcohol use No  . Drug use: No  . Sexual activity: Not Asked   Other Topics Concern  . None   Social History Narrative  . None     Family History:  The patient's family history includes Cancer in her mother; Heart disease in her brother, father, and sister.   ROS:   Please see the history of present illness.    ROS All other systems reviewed and are negative.   PHYSICAL EXAM:   VS:  BP (!) 147/83   Pulse 65   Ht 5' 5.5" (1.664 m)   Wt 210 lb (95.3 kg)   BMI 34.41 kg/m    GEN: Well nourished, well developed, in no acute distress  HEENT: normal  Neck: no JVD, carotid bruits, or masses Cardiac: RRR; no murmurs, rubs, or gallops,no edema  Respiratory:  clear to auscultation bilaterally, normal work of breathing GI: soft, nontender, nondistended, + BS MS: no deformity or atrophy  Skin: warm and dry, no rash Neuro:  Alert and Oriented x 3, Strength and sensation are intact Psych: euthymic mood, full affect  Wt Readings from Last 3 Encounters:  03/27/17 210 lb (95.3 kg)  03/07/17 202 lb (91.6 kg)  01/25/17 207 lb 6.4 oz (94.1 kg)      Studies/Labs Reviewed:   EKG:  EKG is ordered today.  The ekg ordered today demonstrates Normal sinus rhythm T wave inversions in V1 and V2, heart rate 65  Recent Labs: 06/25/2016: ALT 16 11/15/2016: Magnesium 2.0 03/27/2017: BUN 28; Creatinine, Ser 0.91; Hemoglobin 14.6; Platelets 219; Potassium 4.1; Sodium 141   Lipid Panel    Component Value Date/Time   CHOL 187 06/27/2015 1336   TRIG 239 (H) 06/27/2015 1336   HDL 45 (L) 06/27/2015 1336   CHOLHDL 4.2 06/27/2015 1336   VLDL 48 (H) 06/27/2015 1336   LDLCALC 94 06/27/2015 1336    Additional studies/ records  that were reviewed today include:   Echo 07/05/2014 LV EF: 65% -  70% Study Conclusions  - Left ventricle: Systolic function was vigorous. The estimated ejection fraction was in the range of 65% to 70%. - Mitral valve: There was mild regurgitation.   Cath 01/27/2015 Conclusion    Ost Cx  lesion, 60% stenosed.  LM lesion, 50% stenosed.  1. Moderate distal left main artery stenosis with fractional flow reserve assessment suggesting the lesion is not flow limiting. (FFR=0.91) 2. Moderate ostial Circumflex stenosis  Recommendations:Medical management of CAD. I would not pursue revascularization strategies at this time. Her chest pressure has been present continuously for months with no relief suggesting it is not cardiac related.      Myoview 10/23/2015 Study Highlights    Nuclear stress EF: 66%.  The left ventricular ejection fraction is hyperdynamic (>65%).  There was no ST segment deviation noted during stress.  The study is normal.  This is a low risk study.  Normal pharmacologic nuclear study with no evidence for infarct or ischemia.      LE ABI 07/24/2016 Impressions No evidence of segmental lower extremity arterial disease at rest, bilaterally. ABI's are normal, bilaterally. The great toe-brachial indices are normal, bilaterally.    Myoview 11/20/2016 Study Highlights     The left ventricular ejection fraction is normal (55-65%).  Nuclear stress EF: 62%.  T wave inversion of 4 mm was noted during stress in the II, III, aVF, V6, V5 and V4 leads. T wave inversion persisted.  There was no ST segment deviation noted during stress.  This is a low risk study.   No reversible ischemia. RV uptake noted which could suggest elevated PA pressure. LVEF 62% with normal wall motion. This is a low risk study.     ASSESSMENT:    1. Coronary artery disease involving native coronary artery of native heart with unstable angina pectoris ([place])   2.  Essential hypertension   3. Hyperlipidemia, unspecified hyperlipidemia type   4. Chronic diastolic heart failure (HCC)      PLAN:  In order of problems listed above:  1. CAD with unstable angina:   - Since last cardiac catheterization which showed 50% left main and 60% left circumflex, she's had Myoview in 2017 and also early 2018. Despite this, she has been having progressive shortness of breath with exertion and chest pain.   - I have discussed the case with her primary cardiologist Dr. Sallyanne Kuster, I recommended a cardiac catheterization. She does have contrast dye allergy, we will premedicate her  - Risk and benefit of procedure explained to the patient who display clear understanding and agree to proceed. Discussed with patient possible procedural risk include bleeding, vascular injury, renal injury, arrythmia, MI, stroke and loss of limb or life.  2. HTN: Blood pressure mildly elevated today, will reassess after cardiac catheterization. Consider adding amlodipine  3. HLD: On Pravachol.  4. Chronic diastolic heart failure: Euvolemic on physical exam.    Medication Adjustments/Labs and Tests Ordered: Current medicines are reviewed at length with the patient today.  Concerns regarding medicines are outlined above.  Medication changes, Labs and Tests ordered today are listed in the Patient Instructions below. Patient Instructions  Medication Instructions:  1. Your physician recommends that you continue on your current medications as directed. Please refer to the Current Medication list given to you today.  2. AN RX HAS BEEN SENT IN FOR PREDNISONE 50 MG TABLET FOR YOUR HEART CATH : DIRECTIONS TO TAKE ONE 50 MG TABLET AT 6 PM ON 7/19, 12 AM 7/20, 6 AM 7/20  Labwork: TODAY BMET, CBC, PT/INR  Testing/Procedures: Your physician has requested that you have a cardiac catheterization. Cardiac catheterization is used to diagnose and/or treat various heart conditions. Doctors may recommend  this procedure for a number of different reasons. The  most common reason is to evaluate chest pain. Chest pain can be a symptom of coronary artery disease (CAD), and cardiac catheterization can show whether plaque is narrowing or blocking your heart's arteries. This procedure is also used to evaluate the valves, as well as measure the blood flow and oxygen levels in different parts of your heart. For further information please visit HugeFiesta.tn. Please follow instruction sheet, as given.    Follow-Up: 04/30/17 @ 8 AM WITH Marland Reine, PAC POST CATH FOLLOW UP  Any Other Special Instructions Will Be Listed Below (If Applicable). If you need a refill on your cardiac medications before your next appointment, please call your pharmacy.    [facility] [address] [place] [address] Dept: [phone] Loc: [phone]  Jill Preston  03/27/2017  You are scheduled for a Cardiac Catheterization on Friday, July 20 with Dr. Peter Martinique.  1. Please arrive at the [facility] (Main Entrance A) at [facility]: [place], [place] [address] at 8:30 AM (two hours before your procedure to ensure your preparation). Free valet parking service is available.   Special note: Every effort is made to have your procedure done on time. Please understand that emergencies sometimes delay scheduled procedures.  2. Diet: Do not eat or drink anything after midnight prior to your procedure except sips of water to take medications.  3. Labs: You will need to have blood drawn on Wednesday, July 11 at [place], [place]  Open: [place] (Lunch 12:30 - 1:30)   Phone: [phone]. You do not need to be fasting.  4. Medication instructions in preparation for your procedure:  YOU HAVE BEEN GIVEN AN RX FOR PREDNISONE 50 MG TABLET, PLEASE FOLLOW THESE DIRECTIONS:  1. TAKE  1ST TABLET ON 04/04/17 @ 6 PM 2. TAKE 2ND TABLET ON 7/201/8 @ 12 AM (MIDNIGHT) 3. TAKE 3RD TABLET ON 04/05/17 @ 6 AM THE MORNING OF CATH 4. On the morning of your procedure, take your Aspirin 81 MG THE MORNING OF CATH and any morning medicines NOT listed above.  You may use SMALL sips of water.  5. Plan for one night stay--bring personal belongings. 6. Bring a current list of your medications and current insurance cards. 7. You MUST have a responsible person to drive you home. 8. Someone MUST be with you the first 24 hours after you arrive home or your discharge will be delayed. 9. Please wear clothes that are easy to get on and off and wear slip-on shoes.  Thank you for allowing [place] to care for you!   -- [facility] Invasive Cardiovascular services      Signed, Almyra Deforest, [place]  03/28/2017 7:11 PM    [facility] [place], [place], [place]  [address] Phone: [phone]; Fax: [phone]

## 2017-03-27 NOTE — Patient Instructions (Addendum)
Medication Instructions:  1. Your physician recommends that you continue on your current medications as directed. Please refer to the Current Medication list given to you today.  2. AN RX HAS BEEN SENT IN FOR PREDNISONE 50 MG TABLET FOR YOUR HEART CATH : DIRECTIONS TO TAKE ONE 50 MG TABLET AT 6 PM ON 7/19, 12 AM 7/20, 6 AM 7/20  Labwork: TODAY BMET, CBC, PT/INR  Testing/Procedures: Your physician has requested that you have a cardiac catheterization. Cardiac catheterization is used to diagnose and/or treat various heart conditions. Doctors may recommend this procedure for a number of different reasons. The most common reason is to evaluate chest pain. Chest pain can be a symptom of coronary artery disease (CAD), and cardiac catheterization can show whether plaque is narrowing or blocking your heart's arteries. This procedure is also used to evaluate the valves, as well as measure the blood flow and oxygen levels in different parts of your heart. For further information please visit HugeFiesta.tn. Please follow instruction sheet, as given.    Follow-Up: 04/30/17 @ 8 AM WITH HAO MENG, PAC POST CATH FOLLOW UP  Any Other Special Instructions Will Be Listed Below (If Applicable). If you need a refill on your cardiac medications before your next appointment, please call your pharmacy.    [place] [address] [place] [address] Dept: [phone] Loc: [phone]  Jill Preston  03/27/2017  You are scheduled for a Cardiac Catheterization on Friday, July 20 with Dr. Peter Martinique.  1. Please arrive at the [facility] (Main Entrance A) at [facility]: [place],  [address] at 8:30 AM (two hours before your procedure to ensure your preparation). Free valet parking service is available.   Special note: Every effort is made to have your procedure done on time. Please  understand that emergencies sometimes delay scheduled procedures.  2. Diet: Do not eat or drink anything after midnight prior to your procedure except sips of water to take medications.  3. Labs: You will need to have blood drawn on Wednesday, July 11 at [place], [place]  Open: [place] (Lunch 12:30 - 1:30)   Phone: [phone]. You do not need to be fasting.  4. Medication instructions in preparation for your procedure:  YOU HAVE BEEN GIVEN AN RX FOR PREDNISONE 50 MG TABLET, PLEASE FOLLOW THESE DIRECTIONS:  1. TAKE 1ST TABLET ON 04/04/17 @ 6 PM 2. TAKE 2ND TABLET ON 7/201/8 @ 12 AM (MIDNIGHT) 3. TAKE 3RD TABLET ON 04/05/17 @ 6 AM THE MORNING OF CATH 4. On the morning of your procedure, take your Aspirin 81 MG THE MORNING OF CATH and any morning medicines NOT listed above.  You may use SMALL sips of water.  5. Plan for one night stay--bring personal belongings. 6. Bring a current list of your medications and current insurance cards. 7. You MUST have a responsible person to drive you home. 8. Someone MUST be with you the first 24 hours after you arrive home or your discharge will be delayed. 9. Please wear clothes that are easy to get on and off and wear slip-on shoes.  Thank you for allowing [place] to care for you!   -- [place] Invasive Cardiovascular services

## 2017-03-28 ENCOUNTER — Other Ambulatory Visit: Payer: Self-pay | Admitting: Physician Assistant

## 2017-03-28 ENCOUNTER — Encounter: Payer: Self-pay | Admitting: Physician Assistant

## 2017-03-28 NOTE — Progress Notes (Signed)
Kidney function improved compare to 4 month ago. No sign of anemia.

## 2017-03-29 DIAGNOSIS — M25561 Pain in right knee: Secondary | ICD-10-CM | POA: Diagnosis not present

## 2017-03-29 DIAGNOSIS — M1711 Unilateral primary osteoarthritis, right knee: Secondary | ICD-10-CM | POA: Diagnosis not present

## 2017-03-29 DIAGNOSIS — G8929 Other chronic pain: Secondary | ICD-10-CM | POA: Diagnosis not present

## 2017-03-31 ENCOUNTER — Other Ambulatory Visit: Payer: PPO

## 2017-04-01 ENCOUNTER — Telehealth: Payer: Self-pay | Admitting: Gastroenterology

## 2017-04-01 NOTE — Telephone Encounter (Signed)
Pt noted small amount of BRBPR with mild lower abdominal cramping this evening. No other complaints. She had a colonoscopy by Dr. Collene Mares on 6/21 with a small asc colon polyp removed by cold biopsy. She relates that ASA 81 mg is only blood thinner she takes. Advised pt to change to a clear liquid diet and rest tonight. Call back if bleeding or cramping continues and call Dr. Collene Mares when her office opens on Monday.

## 2017-04-03 ENCOUNTER — Telehealth: Payer: Self-pay

## 2017-04-03 NOTE — Telephone Encounter (Signed)
Patient contacted pre-catheterization at [facility] scheduled for:  04/05/2017 @ 1030 Verified arrival time and place:  [facility] @ [id number]-[id number] directions to admitting Confirmed AM meds to be taken pre-cath with sip of water: Premedicate for dye allergy-50 mg prednisone 7/19 @ 1800, 50 mg pred 04/05/2017 @ 0000, 50 mg pred 04/05/2017 @ 0600 Take baby ASA in am with prednisone Pt to hold losartan/HCTZ day of procedure Confirmed patient has responsible person to drive home post procedure and observe patient for 24 hours:   husband Addl concerns:  Pt allergies to latex, contrast media, tape (paper tape OK)

## 2017-04-05 ENCOUNTER — Encounter (HOSPITAL_COMMUNITY): Payer: Self-pay | Admitting: Cardiology

## 2017-04-05 ENCOUNTER — Ambulatory Visit (HOSPITAL_COMMUNITY)
Admission: RE | Admit: 2017-04-05 | Discharge: 2017-04-05 | Disposition: A | Discharge status: Home / Self Care | Payer: PPO | Source: Ambulatory Visit | Attending: Cardiology | Admitting: Cardiology

## 2017-04-05 ENCOUNTER — Ambulatory Visit (HOSPITAL_COMMUNITY)
Admission: RE | Disposition: A | Discharge status: Home / Self Care | Payer: Self-pay | Source: Ambulatory Visit | Attending: Cardiology

## 2017-04-05 DIAGNOSIS — Z6834 Body mass index (BMI) 34.0-34.9, adult: Secondary | ICD-10-CM | POA: Diagnosis not present

## 2017-04-05 DIAGNOSIS — Z8673 Personal history of transient ischemic attack (TIA), and cerebral infarction without residual deficits: Secondary | ICD-10-CM | POA: Insufficient documentation

## 2017-04-05 DIAGNOSIS — I5032 Chronic diastolic (congestive) heart failure: Secondary | ICD-10-CM | POA: Insufficient documentation

## 2017-04-05 DIAGNOSIS — I130 Hypertensive heart and chronic kidney disease with heart failure and stage 1 through stage 4 chronic kidney disease, or unspecified chronic kidney disease: Secondary | ICD-10-CM | POA: Insufficient documentation

## 2017-04-05 DIAGNOSIS — I25119 Atherosclerotic heart disease of native coronary artery with unspecified angina pectoris: Secondary | ICD-10-CM

## 2017-04-05 DIAGNOSIS — E785 Hyperlipidemia, unspecified: Secondary | ICD-10-CM | POA: Diagnosis not present

## 2017-04-05 DIAGNOSIS — Z880 Allergy status to penicillin: Secondary | ICD-10-CM | POA: Insufficient documentation

## 2017-04-05 DIAGNOSIS — Z91041 Radiographic dye allergy status: Secondary | ICD-10-CM | POA: Insufficient documentation

## 2017-04-05 DIAGNOSIS — M1711 Unilateral primary osteoarthritis, right knee: Secondary | ICD-10-CM | POA: Insufficient documentation

## 2017-04-05 DIAGNOSIS — I2510 Atherosclerotic heart disease of native coronary artery without angina pectoris: Secondary | ICD-10-CM | POA: Diagnosis present

## 2017-04-05 DIAGNOSIS — I119 Hypertensive heart disease without heart failure: Secondary | ICD-10-CM | POA: Diagnosis present

## 2017-04-05 DIAGNOSIS — N183 Chronic kidney disease, stage 3 (moderate): Secondary | ICD-10-CM | POA: Insufficient documentation

## 2017-04-05 DIAGNOSIS — I209 Angina pectoris, unspecified: Secondary | ICD-10-CM | POA: Diagnosis present

## 2017-04-05 DIAGNOSIS — I25110 Atherosclerotic heart disease of native coronary artery with unstable angina pectoris: Secondary | ICD-10-CM | POA: Diagnosis not present

## 2017-04-05 DIAGNOSIS — Z8249 Family history of ischemic heart disease and other diseases of the circulatory system: Secondary | ICD-10-CM | POA: Diagnosis not present

## 2017-04-05 DIAGNOSIS — I2584 Coronary atherosclerosis due to calcified coronary lesion: Secondary | ICD-10-CM | POA: Diagnosis not present

## 2017-04-05 DIAGNOSIS — Z86718 Personal history of other venous thrombosis and embolism: Secondary | ICD-10-CM | POA: Insufficient documentation

## 2017-04-05 DIAGNOSIS — Z91040 Latex allergy status: Secondary | ICD-10-CM | POA: Insufficient documentation

## 2017-04-05 DIAGNOSIS — E039 Hypothyroidism, unspecified: Secondary | ICD-10-CM | POA: Diagnosis not present

## 2017-04-05 DIAGNOSIS — Z7982 Long term (current) use of aspirin: Secondary | ICD-10-CM | POA: Diagnosis not present

## 2017-04-05 DIAGNOSIS — Z955 Presence of coronary angioplasty implant and graft: Secondary | ICD-10-CM | POA: Diagnosis not present

## 2017-04-05 DIAGNOSIS — E669 Obesity, unspecified: Secondary | ICD-10-CM | POA: Diagnosis not present

## 2017-04-05 DIAGNOSIS — I872 Venous insufficiency (chronic) (peripheral): Secondary | ICD-10-CM | POA: Diagnosis not present

## 2017-04-05 HISTORY — PX: CORONARY PRESSURE/FFR STUDY: CATH118243

## 2017-04-05 HISTORY — PX: LEFT HEART CATH AND CORONARY ANGIOGRAPHY: CATH118249

## 2017-04-05 LAB — POCT ACTIVATED CLOTTING TIME: Activated Clotting Time: 224 seconds

## 2017-04-05 SURGERY — LEFT HEART CATH AND CORONARY ANGIOGRAPHY
Anesthesia: LOCAL

## 2017-04-05 MED ORDER — SODIUM CHLORIDE 0.9% FLUSH: 3.0000 mL | INTRAVENOUS | Status: DC | PRN

## 2017-04-05 MED ORDER — HEPARIN (PORCINE) IN NACL 2-0.9 UNIT/ML-% IJ SOLN
INTRAMUSCULAR | Status: AC | PRN
  Administered 2017-04-05: 1000 mL

## 2017-04-05 MED ORDER — DIPHENHYDRAMINE HCL 50 MG/ML IJ SOLN
INTRAMUSCULAR | Status: AC
  Administered 2017-04-05: 25 mg via INTRAVENOUS
  Filled 2017-04-05: qty 1

## 2017-04-05 MED ORDER — ASPIRIN 81 MG PO CHEW: 81.0000 mg | CHEWABLE_TABLET | ORAL | Status: DC

## 2017-04-05 MED ORDER — ADENOSINE 12 MG/4ML IV SOLN
INTRAVENOUS | Status: AC
  Filled 2017-04-05: qty 4

## 2017-04-05 MED ORDER — VERAPAMIL HCL 2.5 MG/ML IV SOLN
INTRAVENOUS | Status: AC
  Filled 2017-04-05: qty 2

## 2017-04-05 MED ORDER — IOPAMIDOL (ISOVUE-370) INJECTION 76%
INTRAVENOUS | Status: AC
  Filled 2017-04-05: qty 50

## 2017-04-05 MED ORDER — HEPARIN SODIUM (PORCINE) 1000 UNIT/ML IJ SOLN
INTRAMUSCULAR | Status: DC | PRN
  Administered 2017-04-05: 3000 [IU] via INTRAVENOUS
  Administered 2017-04-05: 5000 [IU] via INTRAVENOUS
  Administered 2017-04-05: 3000 [IU] via INTRAVENOUS

## 2017-04-05 MED ORDER — HEPARIN SODIUM (PORCINE) 1000 UNIT/ML IJ SOLN
INTRAMUSCULAR | Status: AC
  Filled 2017-04-05: qty 1

## 2017-04-05 MED ORDER — FENTANYL CITRATE (PF) 100 MCG/2ML IJ SOLN
INTRAMUSCULAR | Status: AC
  Filled 2017-04-05: qty 2

## 2017-04-05 MED ORDER — ADENOSINE (DIAGNOSTIC) 140MCG/KG/MIN
INTRAVENOUS | Status: AC | PRN
  Administered 2017-04-05: 140 ug/kg/min via INTRAVENOUS

## 2017-04-05 MED ORDER — SODIUM CHLORIDE 0.9 % WEIGHT BASED INFUSION: 1.0000 mL/kg/h | INTRAVENOUS | Status: AC

## 2017-04-05 MED ORDER — SODIUM CHLORIDE 0.9 % IV SOLN: 250.0000 mL | INTRAVENOUS | Status: DC | PRN

## 2017-04-05 MED ORDER — IOPAMIDOL (ISOVUE-370) INJECTION 76%
INTRAVENOUS | Status: AC
  Filled 2017-04-05: qty 100

## 2017-04-05 MED ORDER — SODIUM CHLORIDE 0.9 % WEIGHT BASED INFUSION: 1.0000 mL/kg/h | INTRAVENOUS | Status: DC

## 2017-04-05 MED ORDER — DIPHENHYDRAMINE HCL 50 MG/ML IJ SOLN
25.0000 mg | INTRAMUSCULAR | Status: AC
  Administered 2017-04-05: 25 mg via INTRAVENOUS

## 2017-04-05 MED ORDER — MIDAZOLAM HCL 2 MG/2ML IJ SOLN
INTRAMUSCULAR | Status: DC | PRN
  Administered 2017-04-05: 1 mg via INTRAVENOUS

## 2017-04-05 MED ORDER — VERAPAMIL HCL 2.5 MG/ML IV SOLN
INTRAVENOUS | Status: DC | PRN
  Administered 2017-04-05: 10 mL via INTRA_ARTERIAL

## 2017-04-05 MED ORDER — ADENOSINE 12 MG/4ML IV SOLN
INTRAVENOUS | Status: AC
  Filled 2017-04-05: qty 16

## 2017-04-05 MED ORDER — MIDAZOLAM HCL 2 MG/2ML IJ SOLN
INTRAMUSCULAR | Status: AC
  Filled 2017-04-05: qty 2

## 2017-04-05 MED ORDER — LIDOCAINE HCL (PF) 1 % IJ SOLN
INTRAMUSCULAR | Status: AC
  Filled 2017-04-05: qty 30

## 2017-04-05 MED ORDER — FENTANYL CITRATE (PF) 100 MCG/2ML IJ SOLN
INTRAMUSCULAR | Status: DC | PRN
Start: 2017-04-05 — End: 2017-04-05
  Administered 2017-04-05: 25 ug via INTRAVENOUS

## 2017-04-05 MED ORDER — FAMOTIDINE IN NACL 20-0.9 MG/50ML-% IV SOLN
INTRAVENOUS | Status: AC
  Administered 2017-04-05: 20 mg via INTRAVENOUS
  Filled 2017-04-05: qty 50

## 2017-04-05 MED ORDER — FAMOTIDINE IN NACL 20-0.9 MG/50ML-% IV SOLN
20.0000 mg | INTRAVENOUS | Status: AC
  Administered 2017-04-05: 20 mg via INTRAVENOUS

## 2017-04-05 MED ORDER — SODIUM CHLORIDE 0.9 % WEIGHT BASED INFUSION
3.0000 mL/kg/h | INTRAVENOUS | Status: AC
  Administered 2017-04-05: 3 mL/kg/h via INTRAVENOUS

## 2017-04-05 MED ORDER — SODIUM CHLORIDE 0.9% FLUSH: 3.0000 mL | Freq: Two times a day (BID) | INTRAVENOUS | Status: DC

## 2017-04-05 SURGICAL SUPPLY — 14 items
CATH 5FR JL3.5 JR4 ANG PIG MP (CATHETERS) ×2 IMPLANT
CATH LAUNCHER 5F RADR (CATHETERS) ×1 IMPLANT
CATH LAUNCHER 6FR EBU3.5 (CATHETERS) ×2 IMPLANT
CATHETER LAUNCHER 5F RADR (CATHETERS) ×2
DEVICE RAD COMP TR BAND LRG (VASCULAR PRODUCTS) ×2 IMPLANT
GLIDESHEATH SLEND SS 6F .021 (SHEATH) ×2 IMPLANT
GUIDEWIRE INQWIRE 1.5J.035X260 (WIRE) ×1 IMPLANT
GUIDEWIRE PRESSURE COMET II (WIRE) ×2 IMPLANT
INQWIRE 1.5J .035X260CM (WIRE) ×2
KIT HEART LEFT (KITS) ×2 IMPLANT
PACK CARDIAC CATHETERIZATION (CUSTOM PROCEDURE TRAY) ×2 IMPLANT
SYR MEDRAD MARK V 150ML (SYRINGE) ×2 IMPLANT
TRANSDUCER W/STOPCOCK (MISCELLANEOUS) ×2 IMPLANT
TUBING CIL FLEX 10 FLL-RA (TUBING) ×2 IMPLANT

## 2017-04-05 NOTE — H&P (View-Only) (Signed)
Cardiology Office Note    Date:  03/28/2017   ID:  Jill Preston, DOB 21-Aug-1933, MRN [id number]  PCP:  Merrilee Seashore, MD  Cardiologist:  Dr. Sallyanne Kuster  Chief Complaint  Patient presents with  . Follow-up    seen for Dr. Sallyanne Kuster  . Chest Pain    last recent chest pain last monday.     History of Present Illness:  Jill Preston is a 83 y.o. female with PMH of CAD, chronic venous insufficiency, hyperlipidemia, hypertension, chronic diastolic heart failure, hypothyroidism, stage III CKD, and history of CVA. In December 2015, she did develop acute left iliofemoral DVT with anticoagulation complicated by retroperitoneal hematoma and hemorrhagic shock, requiring placement of inferior vena cava filter. The filter was removed in June 2016. She had a history of chronic diastolic heart failure with very narrow therapeutic range between acute kidney injury versus fluid overload. She was eventually placed on sliding-scale diuretic with no diuretic if her weight is under 205 pounds and double the dose of diuretic when she exceeded 210 pounds. Her dry weight seems to be around 204-205 pounds. This seems to have worked quite well for her. She has a long history of coronary artery disease and underwent proximal LAD bare metal stenting and balloon angioplasty of mid LAD in 2013. She has had persistent ischemia in the territory of diagonal artery on nuclear stress test over the last few years. Cardiac cath performed in May 2016 showed a widely patent LAD stent, 60% ostial left circumflex stenosis, 20-30% RCA stenosis. There was concern of 50% LM disease, but FFR was normal. There was CT surgery consult by Dr. Roxy Horseman regarding her moderate left main disease, after reviewing her chart, it was felt she is likely high risk surgical candidate, therefore medical therapy was recommended. She has not had any further angina symptom. She has good angina response to Ranexa but developed severe constipation  could not tolerate the medication. She is also intolerant to beta blocker due to baseline bradycardia. She has preserved ejection fraction with episodes of acute exacerbation of diastolic heart failure attributed to pulmonary hypertensive disease. Myoview obtained in February 2017 was low risk without ischemia. She was seen by Dr. Sallyanne Kuster on 11/15/2016, there were EKG changes and some chest pain. Myoview was repeated on 11/20/2016 which showed EF 62%, T wave inversion all 4 mm noted during stress in the inferolateral leads, however no perfusion abnormality, overall considered low risk study.  She presents today for cardiology office visit, since she was last seen, she continued to have worsening shortness of breath with exertion and chest tightness. She has not found any significant relief. She denies any lower extremity edema, orthopnea or paroxysmal nocturnal dyspnea. I did discuss her case with her primary cardiologist Dr. Sallyanne Kuster, she had known left main disease, we will arrange for outpatient cardiac catheterization for more definitive diagnosis. She says she did have recent colonoscopy, afterward she had some bright red blood per rectum, she is taking medication for it. Given her recent GI bleed, I will delay her cardiac catheterization to later next week.   Past Medical History:  Diagnosis Date  . Arthritis    right knee  . Cancer [facility]) yrs ago   skin cancer removed from face  . Chronic diastolic CHF (congestive heart failure) ([place])   . Chronic venous insufficiency    LEA VENOUS, 10/17/2011 - mild reflux in bilateral common femoral veins  . CKD (chronic kidney disease), stage III   . Coronary artery  disease    a. s/p PCI/BMS to prox LAD and balloon angioplasty to mLAD with suboptimal result in 2000. b. Abnl nuc 2012, cath 09/2011 - showed that the overall territory of potential ischemia was small and attributable to a moderately diseased small second diagonal artery. Med rx.  . Hyperlipidemia     . Hypertension   . Hypertensive heart disease   . Hypothyroidism   . Obesity   . Stroke [facility])    pt. states she had "light stroke" in sept. 1980  . Urinary incontinence     Past Surgical History:  Procedure Laterality Date  . ABDOMINAL HYSTERECTOMY  1970's   complete  . BACK SURGERY  07/26/2016   cervical neck surgery, [facility]  . BLADDER SURGERY  2010   WITH MESH  bladder tach  . bunion removal surgery Bilateral 15 yrs ago  . CARDIAC CATHETERIZATION Left 09/25/2011   Medical management  . CARDIAC CATHETERIZATION Left 03/25/2001   Normal LV function, LAD residual narrowing of less than 10%, normal ramus intermediate, circumflex, and RCA,   . CARDIAC CATHETERIZATION  09/04/1999   LAD, 3x68mm Tetra stent resulting in a reduction of the 80% stenosis to 0% residual  . CARDIAC CATHETERIZATION N/A 01/26/2015   Procedure: Right/Left Heart Cath and Coronary Angiography;  Surgeon: Troy Sine, MD;  Location: [place] CV LAB;  Service: Cardiovascular;  Laterality: N/A;  . CARDIAC CATHETERIZATION N/A 01/27/2015   Procedure: Intravascular Pressure Wire/FFR Study;  Surgeon: Burnell Blanks, MD;  Location: [place] CV LAB;  Service: Cardiovascular;  Laterality: N/A;  . CARDIAC CATHETERIZATION N/A 01/27/2015   Procedure: Right Heart Cath;  Surgeon: Burnell Blanks, MD;  Location: [place] CV LAB;  Service: Cardiovascular;  Laterality: N/A;  . CHOLECYSTECTOMY    . COLONOSCOPY WITH PROPOFOL N/A 03/07/2017   Procedure: COLONOSCOPY WITH PROPOFOL;  Surgeon: Juanita Craver, MD;  Location: [facility];  Service: Endoscopy;  Laterality: N/A;  . CORONARY STENT PLACEMENT     LAD x 1  . ganglion cyst removal  yrs ago   x 2  . LEFT HEART CATHETERIZATION WITH CORONARY ANGIOGRAM N/A 09/25/2011   Procedure: LEFT HEART CATHETERIZATION WITH CORONARY ANGIOGRAM;  Surgeon: Sanda Klein, MD;  Location: [place] CATH LAB;  Service: Cardiovascular;  Laterality: N/A;  . multiple bladder  surgeries to remove mesh    . ROTATOR CUFF REPAIR Right   . stent to groin  08/2014   left leg    Current Medications: Outpatient Medications Prior to Visit  Medication Sig Dispense Refill  . Apoaequorin (PREVAGEN) 10 MG CAPS Take 10 mg by mouth daily.    Marland Kitchen aspirin EC 81 MG tablet Take 1 tablet (81 mg total) by mouth daily. 90 tablet 3  . Calcium Carbonate-Vitamin D (CALCIUM 600+D PO) Take 1 tablet by mouth daily.    . diphenhydrAMINE (BENADRYL) 25 MG tablet Take 25 mg by mouth at bedtime as needed for sleep.    Marland Kitchen docusate sodium (COLACE) 100 MG capsule Take 100 mg by mouth daily.    . fenofibrate 160 MG tablet Take 160 mg by mouth daily.   0  . Homeopathic Products (LEG CRAMP RELIEF) SUBL Place 2 tablets under the tongue at bedtime as needed (cramps).    . isosorbide mononitrate (IMDUR) 30 MG 24 hr tablet TAKE 2 TABLETS BY MOUTH 2 TIMES DAILY 120 tablet 11  . levothyroxine (SYNTHROID, LEVOTHROID) 137 MCG tablet Take 137 mcg by mouth daily before breakfast.    .  losartan-hydrochlorothiazide (HYZAAR) 100-25 MG tablet Take 0.5 tablets by mouth daily.  0  . Menthol-Methyl Salicylate (MUSCLE RUB) 10-15 % CREA Apply 1 application topically as needed for muscle pain.    . Multiple Vitamin (MULTIVITAMIN WITH MINERALS) TABS tablet Take 1 tablet by mouth daily. Centrum 50+    . nitroGLYCERIN (NITROSTAT) 0.4 MG SL tablet Place 1 tablet (0.4 mg total) under the tongue every 5 (five) minutes as needed for chest pain. 25 tablet 2  . nystatin (MYCOSTATIN/NYSTOP) powder Apply 1 g topically daily as needed (rash).     . pravastatin (PRAVACHOL) 80 MG tablet Take 80 mg by mouth at bedtime.    . vitamin C (ASCORBIC ACID) 500 MG tablet Take 1,000 mg by mouth daily.     No facility-administered medications prior to visit.      Allergies:   Atorvastatin; Penicillins; Aminophylline; Latex; Shellfish allergy; Adhesive [tape]; Ciprofloxacin; Codeine; Contrast media [iodinated diagnostic agents]; Gelnique  [oxybutynin]; and Ranexa [ranolazine]   Social History   Social History  . Marital status: Married    Spouse name: N/A  . Number of children: N/A  . Years of education: N/A   Social History Main Topics  . Smoking status: Never Smoker  . Smokeless tobacco: Never Used  . Alcohol use No  . Drug use: No  . Sexual activity: Not Asked   Other Topics Concern  . None   Social History Narrative  . None     Family History:  The patient's family history includes Cancer in her mother; Heart disease in her brother, father, and sister.   ROS:   Please see the history of present illness.    ROS All other systems reviewed and are negative.   PHYSICAL EXAM:   VS:  BP (!) 147/83   Pulse 65   Ht 5' 5.5" (1.664 m)   Wt 210 lb (95.3 kg)   BMI 34.41 kg/m    GEN: Well nourished, well developed, in no acute distress  HEENT: normal  Neck: no JVD, carotid bruits, or masses Cardiac: RRR; no murmurs, rubs, or gallops,no edema  Respiratory:  clear to auscultation bilaterally, normal work of breathing GI: soft, nontender, nondistended, + BS MS: no deformity or atrophy  Skin: warm and dry, no rash Neuro:  Alert and Oriented x 3, Strength and sensation are intact Psych: euthymic mood, full affect  Wt Readings from Last 3 Encounters:  03/27/17 210 lb (95.3 kg)  03/07/17 202 lb (91.6 kg)  01/25/17 207 lb 6.4 oz (94.1 kg)      Studies/Labs Reviewed:   EKG:  EKG is ordered today.  The ekg ordered today demonstrates Normal sinus rhythm T wave inversions in V1 and V2, heart rate 65  Recent Labs: 06/25/2016: ALT 16 11/15/2016: Magnesium 2.0 03/27/2017: BUN 28; Creatinine, Ser 0.91; Hemoglobin 14.6; Platelets 219; Potassium 4.1; Sodium 141   Lipid Panel    Component Value Date/Time   CHOL 187 06/27/2015 1336   TRIG 239 (H) 06/27/2015 1336   HDL 45 (L) 06/27/2015 1336   CHOLHDL 4.2 06/27/2015 1336   VLDL 48 (H) 06/27/2015 1336   LDLCALC 94 06/27/2015 1336    Additional studies/ records  that were reviewed today include:   Echo 07/05/2014 LV EF: 65% -  70% Study Conclusions  - Left ventricle: Systolic function was vigorous. The estimated ejection fraction was in the range of 65% to 70%. - Mitral valve: There was mild regurgitation.   Cath 01/27/2015 Conclusion    Ost Cx  lesion, 60% stenosed.  LM lesion, 50% stenosed.  1. Moderate distal left main artery stenosis with fractional flow reserve assessment suggesting the lesion is not flow limiting. (FFR=0.91) 2. Moderate ostial Circumflex stenosis  Recommendations:Medical management of CAD. I would not pursue revascularization strategies at this time. Her chest pressure has been present continuously for months with no relief suggesting it is not cardiac related.      Myoview 10/23/2015 Study Highlights    Nuclear stress EF: 66%.  The left ventricular ejection fraction is hyperdynamic (>65%).  There was no ST segment deviation noted during stress.  The study is normal.  This is a low risk study.  Normal pharmacologic nuclear study with no evidence for infarct or ischemia.      LE ABI 07/24/2016 Impressions No evidence of segmental lower extremity arterial disease at rest, bilaterally. ABI's are normal, bilaterally. The great toe-brachial indices are normal, bilaterally.    Myoview 11/20/2016 Study Highlights     The left ventricular ejection fraction is normal (55-65%).  Nuclear stress EF: 62%.  T wave inversion of 4 mm was noted during stress in the II, III, aVF, V6, V5 and V4 leads. T wave inversion persisted.  There was no ST segment deviation noted during stress.  This is a low risk study.   No reversible ischemia. RV uptake noted which could suggest elevated PA pressure. LVEF 62% with normal wall motion. This is a low risk study.     ASSESSMENT:    1. Coronary artery disease involving native coronary artery of native heart with unstable angina pectoris ([place])   2.  Essential hypertension   3. Hyperlipidemia, unspecified hyperlipidemia type   4. Chronic diastolic heart failure (HCC)      PLAN:  In order of problems listed above:  1. CAD with unstable angina:   - Since last cardiac catheterization which showed 50% left main and 60% left circumflex, she's had Myoview in 2017 and also early 2018. Despite this, she has been having progressive shortness of breath with exertion and chest pain.   - I have discussed the case with her primary cardiologist Dr. Sallyanne Kuster, I recommended a cardiac catheterization. She does have contrast dye allergy, we will premedicate her  - Risk and benefit of procedure explained to the patient who display clear understanding and agree to proceed. Discussed with patient possible procedural risk include bleeding, vascular injury, renal injury, arrythmia, MI, stroke and loss of limb or life.  2. HTN: Blood pressure mildly elevated today, will reassess after cardiac catheterization. Consider adding amlodipine  3. HLD: On Pravachol.  4. Chronic diastolic heart failure: Euvolemic on physical exam.    Medication Adjustments/Labs and Tests Ordered: Current medicines are reviewed at length with the patient today.  Concerns regarding medicines are outlined above.  Medication changes, Labs and Tests ordered today are listed in the Patient Instructions below. Patient Instructions  Medication Instructions:  1. Your physician recommends that you continue on your current medications as directed. Please refer to the Current Medication list given to you today.  2. AN RX HAS BEEN SENT IN FOR PREDNISONE 50 MG TABLET FOR YOUR HEART CATH : DIRECTIONS TO TAKE ONE 50 MG TABLET AT 6 PM ON 7/19, 12 AM 7/20, 6 AM 7/20  Labwork: TODAY BMET, CBC, PT/INR  Testing/Procedures: Your physician has requested that you have a cardiac catheterization. Cardiac catheterization is used to diagnose and/or treat various heart conditions. Doctors may recommend  this procedure for a number of different reasons. The  most common reason is to evaluate chest pain. Chest pain can be a symptom of coronary artery disease (CAD), and cardiac catheterization can show whether plaque is narrowing or blocking your heart's arteries. This procedure is also used to evaluate the valves, as well as measure the blood flow and oxygen levels in different parts of your heart. For further information please visit [email or web address]. Please follow instruction sheet, as given.    Follow-Up: 04/30/17 @ 8 AM WITH Dyanara Cozza, PAC POST CATH FOLLOW UP  Any Other Special Instructions Will Be Listed Below (If Applicable). If you need a refill on your cardiac medications before your next appointment, please call your pharmacy.    [facility] [address] [place] [address] Dept: [phone] Loc: [phone]  Jill Preston  03/27/2017  You are scheduled for a Cardiac Catheterization on Friday, July 20 with Dr. Peter Martinique.  1. Please arrive at the [facility] (Main Entrance A) at [facility]: [place], [place] [address] at 8:30 AM (two hours before your procedure to ensure your preparation). Free valet parking service is available.   Special note: Every effort is made to have your procedure done on time. Please understand that emergencies sometimes delay scheduled procedures.  2. Diet: Do not eat or drink anything after midnight prior to your procedure except sips of water to take medications.  3. Labs: You will need to have blood drawn on Wednesday, July 11 at [place], [place]  Open: [place] (Lunch 12:30 - 1:30)   Phone: [phone]. You do not need to be fasting.  4. Medication instructions in preparation for your procedure:  YOU HAVE BEEN GIVEN AN RX FOR PREDNISONE 50 MG TABLET, PLEASE FOLLOW THESE DIRECTIONS:  1. TAKE  1ST TABLET ON 04/04/17 @ 6 PM 2. TAKE 2ND TABLET ON 7/201/8 @ 12 AM (MIDNIGHT) 3. TAKE 3RD TABLET ON 04/05/17 @ 6 AM THE MORNING OF CATH 4. On the morning of your procedure, take your Aspirin 81 MG THE MORNING OF CATH and any morning medicines NOT listed above.  You may use SMALL sips of water.  5. Plan for one night stay--bring personal belongings. 6. Bring a current list of your medications and current insurance cards. 7. You MUST have a responsible person to drive you home. 8. Someone MUST be with you the first 24 hours after you arrive home or your discharge will be delayed. 9. Please wear clothes that are easy to get on and off and wear slip-on shoes.  Thank you for allowing [place] to care for you!   -- [facility] Invasive Cardiovascular services      Signed, Almyra Deforest, [place]  03/28/2017 7:11 PM    [facility] [place], [place], [place]  [address] Phone: [phone]; Fax: [phone]

## 2017-04-05 NOTE — Discharge Instructions (Signed)

## 2017-04-05 NOTE — Interval H&P Note (Signed)
History and Physical Interval Note:  04/05/2017 9:08 AM  Jill Preston  has presented today for surgery, with the diagnosis of CAD, unstable angina  The various methods of treatment have been discussed with the patient and family. After consideration of risks, benefits and other options for treatment, the patient has consented to  Procedure(s): Left Heart Cath and Coronary Angiography (N/A) as a surgical intervention .  The patient's history has been reviewed, patient examined, no change in status, stable for surgery.  I have reviewed the patient's chart and labs.  Questions were answered to the patient's satisfaction.     Collier [place]ina [id number] [facility] 04/05/2017 9:08 AM Cath Lab Visit (complete for each Cath Lab visit)  Clinical Evaluation Leading to the Procedure:   ACS: No.  Non-ACS:    Anginal Classification: CCS III  Anti-ischemic medical therapy: Minimal Therapy (1 class of medications)  Non-Invasive Test Results: No non-invasive testing performed  Prior CABG: No previous CABG

## 2017-04-08 DIAGNOSIS — I10 Essential (primary) hypertension: Secondary | ICD-10-CM | POA: Diagnosis not present

## 2017-04-08 DIAGNOSIS — E782 Mixed hyperlipidemia: Secondary | ICD-10-CM | POA: Diagnosis not present

## 2017-04-08 DIAGNOSIS — E039 Hypothyroidism, unspecified: Secondary | ICD-10-CM | POA: Diagnosis not present

## 2017-04-08 MED FILL — Lidocaine HCl Local Preservative Free (PF) Inj 1%: INTRAMUSCULAR | Qty: 30 | Status: AC

## 2017-04-08 MED FILL — Adenosine IV Soln 12 MG/4ML: INTRAVENOUS | Qty: 8 | Status: AC

## 2017-04-15 ENCOUNTER — Encounter: Payer: Self-pay | Admitting: Cardiovascular Disease

## 2017-04-15 ENCOUNTER — Ambulatory Visit (INDEPENDENT_AMBULATORY_CARE_PROVIDER_SITE_OTHER): Payer: PPO | Admitting: Cardiovascular Disease

## 2017-04-15 VITALS — BP 120/64 | HR 60 | Ht 65.5 in | Wt 214.0 lb

## 2017-04-15 DIAGNOSIS — I5033 Acute on chronic diastolic (congestive) heart failure: Secondary | ICD-10-CM | POA: Diagnosis not present

## 2017-04-15 DIAGNOSIS — IMO0001 Reserved for inherently not codable concepts without codable children: Secondary | ICD-10-CM

## 2017-04-15 DIAGNOSIS — I10 Essential (primary) hypertension: Secondary | ICD-10-CM

## 2017-04-15 DIAGNOSIS — E669 Obesity, unspecified: Secondary | ICD-10-CM | POA: Diagnosis not present

## 2017-04-15 DIAGNOSIS — E785 Hyperlipidemia, unspecified: Secondary | ICD-10-CM

## 2017-04-15 DIAGNOSIS — I25118 Atherosclerotic heart disease of native coronary artery with other forms of angina pectoris: Secondary | ICD-10-CM

## 2017-04-15 DIAGNOSIS — N183 Chronic kidney disease, stage 3 unspecified: Secondary | ICD-10-CM

## 2017-04-15 MED ORDER — MICONAZOLE NITRATE 2 % POWD: 1.0000 "application " | Freq: Two times a day (BID) | 0 refills | Status: DC

## 2017-04-15 NOTE — Progress Notes (Signed)
Patient ID: Jill Preston, female   DOB: 02-06-1933, 83 y.o.   MRN: [id number] Patient ID: Jill Preston, female   DOB: Aug 31, 1933, 83 y.o.   MRN: [id number]    Cardiology Office Note    Date:  04/17/2017   ID:  Jill Preston, DOB 02-15-1933, MRN [id number]  PCP:  Merrilee Seashore, MD  Cardiologist:   Sanda Klein, MD   chief complaint: chest pain   History of Present Illness:  Jill Preston is a 83 y.o. female returning in follow-up For CAD and CHF.  Due to recurrent problems with chest pain she underwent coronary angiography on July 20. This showed similar anatomy to her previous catheterization. The old stent in the LAD artery was widely patent. There is a eccentric stenosis in the distal left main coronary artery that has not changed since 2016. Fractional flow reserve was only mildly altered both in the LAD (0.89) as well as in the left circumflex (0.86). The lesion was felt to not be flow-limiting and medical therapy was advised. The ventricular ejection fraction remains normal at 55-65 percent with normal wall motion. Left ventricular end-diastolic pressure was only borderline abnormal at 17 mmHg.  She has significant shortness of breath on exertion, now class II-III, worse than before catheterization. She weighs roughly 9 pounds more than she did at the time of her angiogram. She is 14 pounds over her previously estimated dry weight of 200 pounds or so. She also has ankle swelling. She does not have orthopnea or PND and denies any palpitations, syncope or focal neurological events.  She continues to have a lot of problems with her spine.Complains of inguinal intertrigo  The patient also denies abdominal pain, nausea, vomiting, dysphagia, diarrhea, constipation, polyuria, polydipsia, dysuria, hematuria, frequency, urgency, abnormal bleeding or bruising, fever, chills, mood swings, change in skin or hair texture, change in voice quality, auditory or visual problems,  allergic reactions .   She has a long history of coronary disease. She underwent proximal LAD bare-metal stenting and balloon angioplasty of the mid LAD in 2013. She has had persistent ischemia in the territory of the diagonal artery on nuclear stress test performed over the last few years. Coronary angiography performed May 2016 showed a widely patent LAD stent, 60% ostial circumflex stenosis, 20-30% right coronary artery stenosis. There was concern about possible left coronary artery stenosis but fractional flow reserve was normal (0.96 at baseline, 0.91 during intravenous adenosine infusion). She had good anginal response to Ranexa but developed severe constipation and could not tolerate the medication. She has been intolerant to beta blockers due to bradycardia. She has preserved left ventricular systolic function but has had episodes of acute exacerbation of diastolic heart failure attributed to hypertensive heart disease. December 2015, she was critically ill with an acute left iliofemoral DVT with anticoagulation complicated by retroperitoneal hematoma and hemorrhagic shock, requiring placement of an inferior vena cava filter. The filter was removed in June 2016. Coronary angiography in July 2018 showed unchanged anatomy of the eccentric stenosis in the left main coronary artery with noncritical FFR (0.89 in the LAD, 0.86 in the LCx).  Past Medical History:  Diagnosis Date  . Arthritis    right knee  . Cancer [facility]) yrs ago   skin cancer removed from face  . Chronic diastolic CHF (congestive heart failure) ([place])   . Chronic venous insufficiency    LEA VENOUS, 10/17/2011 - mild reflux in bilateral common femoral veins  . CKD (chronic kidney disease), stage III   .  Coronary artery disease    a. s/p PCI/BMS to prox LAD and balloon angioplasty to mLAD with suboptimal result in 2000. b. Abnl nuc 2012, cath 09/2011 - showed that the overall territory of potential ischemia was small and attributable to a  moderately diseased small second diagonal artery. Med rx.  . Hyperlipidemia   . Hypertension   . Hypertensive heart disease   . Hypothyroidism   . Obesity   . Stroke [facility])    pt. states she had "light stroke" in sept. 1980  . Urinary incontinence     Past Surgical History:  Procedure Laterality Date  . ABDOMINAL HYSTERECTOMY  1970's   complete  . BACK SURGERY  07/26/2016   cervical neck surgery, [facility]  . BLADDER SURGERY  2010   WITH MESH  bladder tach  . bunion removal surgery Bilateral 15 yrs ago  . CARDIAC CATHETERIZATION Left 09/25/2011   Medical management  . CARDIAC CATHETERIZATION Left 03/25/2001   Normal LV function, LAD residual narrowing of less than 10%, normal ramus intermediate, circumflex, and RCA,   . CARDIAC CATHETERIZATION  09/04/1999   LAD, 3x39mm Tetra stent resulting in a reduction of the 80% stenosis to 0% residual  . CARDIAC CATHETERIZATION N/A 01/26/2015   Procedure: Right/Left Heart Cath and Coronary Angiography;  Surgeon: Troy Sine, MD;  Location: [place] [facility];  Service: Cardiovascular;  Laterality: N/A;  . CARDIAC CATHETERIZATION N/A 01/27/2015   Procedure: Intravascular Pressure Wire/FFR Study;  Surgeon: Burnell Blanks, MD;  Location: [place] [facility];  Service: Cardiovascular;  Laterality: N/A;  . CARDIAC CATHETERIZATION N/A 01/27/2015   Procedure: Right Heart Cath;  Surgeon: Burnell Blanks, MD;  Location: [place] [facility];  Service: Cardiovascular;  Laterality: N/A;  . CHOLECYSTECTOMY    . COLONOSCOPY WITH PROPOFOL N/A 03/07/2017   Procedure: COLONOSCOPY WITH PROPOFOL;  Surgeon: Juanita Craver, MD;  Location: [facility];  Service: Endoscopy;  Laterality: N/A;  . CORONARY STENT PLACEMENT     LAD x 1  . ganglion cyst removal  yrs ago   x 2  . INTRAVASCULAR PRESSURE WIRE/FFR STUDY N/A 04/05/2017   Procedure: Intravascular Pressure Wire/FFR Study;  Surgeon: Martinique, Peter M, MD;  Location: [place] [facility];   Service: Cardiovascular;  Laterality: N/A;  . LEFT HEART CATH AND CORONARY ANGIOGRAPHY N/A 04/05/2017   Procedure: Left Heart Cath and Coronary Angiography;  Surgeon: Martinique, Peter M, MD;  Location: [place] [facility];  Service: Cardiovascular;  Laterality: N/A;  . LEFT HEART CATHETERIZATION WITH CORONARY ANGIOGRAM N/A 09/25/2011   Procedure: LEFT HEART CATHETERIZATION WITH CORONARY ANGIOGRAM;  Surgeon: Sanda Klein, MD;  Location: [place] [facility];  Service: Cardiovascular;  Laterality: N/A;  . multiple bladder surgeries to remove mesh    . ROTATOR CUFF REPAIR Right   . stent to groin  08/2014   left leg    Outpatient Medications Prior to Visit  Medication Sig Dispense Refill  . acetaminophen (TYLENOL) 500 MG tablet Take 1,000 mg by mouth at bedtime.    Marland Kitchen Apoaequorin (PREVAGEN) 10 MG CAPS Take 10 mg by mouth daily.    Marland Kitchen aspirin EC 81 MG tablet Take 1 tablet (81 mg total) by mouth daily. 90 tablet 3  . Calcium Carbonate-Vitamin D (CALCIUM 600+D PO) Take 1 tablet by mouth daily.    . diphenhydrAMINE (BENADRYL) 25 MG tablet Take 25 mg by mouth at bedtime as needed for sleep.    Marland Kitchen docusate sodium (COLACE) 100 MG capsule  Take 100 mg by mouth 2 (two) times daily.     . fenofibrate 160 MG tablet Take 160 mg by mouth daily.   0  . furosemide (LASIX) 20 MG tablet Take 10 mg by mouth daily as needed for fluid or edema.    . Homeopathic Products (LEG CRAMP RELIEF) SUBL Place 2 tablets under the tongue at bedtime as needed (cramps).    . isosorbide mononitrate (IMDUR) 30 MG 24 hr tablet TAKE 2 TABLETS BY MOUTH 2 TIMES DAILY 120 tablet 11  . levothyroxine (SYNTHROID, LEVOTHROID) 137 MCG tablet Take 137 mcg by mouth daily before breakfast.    . losartan-hydrochlorothiazide (HYZAAR) 100-25 MG tablet Take 0.5 tablets by mouth daily.  0  . Menthol-Methyl Salicylate (MUSCLE RUB) 10-15 % CREA Apply 1 application topically 2 (two) times daily as needed for muscle pain (knee pain).     . Multiple Vitamin  (MULTIVITAMIN WITH MINERALS) TABS tablet Take 1 tablet by mouth daily. Centrum 50+    . nitroGLYCERIN (NITROSTAT) 0.4 MG SL tablet Place 1 tablet (0.4 mg total) under the tongue every 5 (five) minutes as needed for chest pain. 25 tablet 2  . nystatin (MYCOSTATIN/NYSTOP) powder Apply 1 g topically daily as needed (rash).     . pravastatin (PRAVACHOL) 80 MG tablet Take 80 mg by mouth at bedtime.    . vitamin C (ASCORBIC ACID) 500 MG tablet Take 1,000 mg by mouth daily.     No facility-administered medications prior to visit.      Allergies:   Atorvastatin; Penicillins; Aminophylline; Latex; Shellfish allergy; Adhesive [tape]; Ciprofloxacin; Codeine; Contrast media [iodinated diagnostic agents]; Gelnique [oxybutynin]; and Ranexa [ranolazine]   Social History   Social History  . Marital status: Married    Spouse name: N/A  . Number of children: N/A  . Years of education: N/A   Social History Main Topics  . Smoking status: Never Smoker  . Smokeless tobacco: Never Used  . Alcohol use No  . Drug use: No  . Sexual activity: Not Asked   Other Topics Concern  . None   Social History Narrative  . None     Family History:  The patient's family history includes Cancer in her mother; Heart disease in her brother, father, and sister.   ROS:   Please see the history of present illness.    ROS All other systems reviewed and are negative.   PHYSICAL EXAM:   VS:  BP 120/64   Pulse 60   Ht 5' 5.5" (1.664 m)   Wt 214 lb (97.1 kg)   BMI 35.07 kg/m     General: Alert, oriented x3, no distress.. Mildly obese Head: no evidence of trauma, PERRL, EOMI, no exophtalmos or lid lag, no myxedema, no xanthelasma; normal ears, nose and oropharynx Neck: normal jugular venous pulsations and no hepatojugular reflux; brisk carotid pulses without delay and no carotid bruits Chest: clear to auscultation, no signs of consolidation by percussion or palpation, normal fremitus, symmetrical and full  respiratory excursions Cardiovascular: normal position and quality of the apical impulse, regular rhythm, normal first and second heart sounds, no murmurs, rubs or gallops Abdomen: no tenderness or distention, no masses by palpation, no abnormal pulsatility or arterial bruits, normal bowel sounds, no hepatosplenomegaly, Inguinal intertrigo Extremities: no clubbing, cyanosis or edema; 2+ radial, ulnar and brachial pulses bilaterally; 2+ right femoral, posterior tibial and dorsalis pedis pulses; 2+ left femoral, posterior tibial and dorsalis pedis pulses; no subclavian or femoral bruits Neurological: grossly nonfocal  Wt Readings from Last 3 Encounters:  04/15/17 214 lb (97.1 kg)  03/27/17 210 lb (95.3 kg)  03/07/17 202 lb (91.6 kg)      Studies/Labs Reviewed:   EKG:  EKG is not ordered today.  Recent Labs: 06/25/2016: ALT 16 11/15/2016: Magnesium 2.0 03/27/2017: BUN 28; Creatinine, Ser 0.91; Hemoglobin 14.6; Platelets 219; Potassium 4.1; Sodium 141   Lipid Panel    Component Value Date/Time   CHOL 187 06/27/2015 1336   TRIG 239 (H) 06/27/2015 1336   HDL 45 (L) 06/27/2015 1336   CHOLHDL 4.2 06/27/2015 1336   VLDL 48 (H) 06/27/2015 1336   LDLCALC 94 06/27/2015 1336   Labs 09/04/2016 total cholesterol 169, triglycerides 160, HDL 40, LDL 97, hemoglobin A1c 6.1%, glucose 104, creatinine 1.16, BUN 19, TSH 0.40  CATH 04/05/2017   LM lesion, 70 %stenosed.  Mid LAD lesion, 0 %stenosed at site of prior stent.  Ost Cx lesion, 60 %stenosed.  Prox RCA-1 lesion, 20 %stenosed.  Prox RCA-2 lesion, 30 %stenosed.  The left ventricular systolic function is normal.  LV end diastolic pressure is normal.  The left ventricular ejection fraction is 55-65% by visual estimate.   1. 70% eccentric distal left main stenosis. Angiographically similar to 2016. FFR 0.89 into the LAD and 0.86 into the LCx suggesting that this lesion is not flow limiting.  2. Otherwise nonobstructive CAD. Patent  stent in LAD. 3. Normal LV function 4. Normal LVEDP  Plan: recommend continued medical therapy.   ASSESSMENT:    1. Coronary artery disease involving native coronary artery of native heart with other form of angina pectoris ([place])   2. Acute on chronic diastolic HF (heart failure) ([place])   3. Essential hypertension   4. Obesity, Class II, BMI 35-39.9, with comorbidity   5. Dyslipidemia   6. CKD (chronic kidney disease), stage III      PLAN:  In order of problems listed above:  1. CAD: recent invasive evaluation again shows a noncritical stenosis in the left main coronary artery. Continue treatment with high-dose statin, aspirin, nitrates. Intolerance to beta blockers due to bradycardia. 2. CHF: She has a narrow margin of compensation between heart failure and kidney failure. She is hypervolemic today and we will increase her diuretics. She is on a tiny dose of loop diuretic at this time Hopefully this will be to amelioration of her symptoms. Ideally we'll get her weight down to about 200 pounds but deftly should be under 205.  3. HTN:  well controlled. She should always use a blood pressure monitor with a large arm cuff in her right upper extremity. Appears to have left subclavian artery stenosis 4. Obesity: Weight loss would be beneficial and is again recommended. As she cannot be physically active this will have to be achieved with calorie restriction 5. Hyperlipidemia: fair lipid profile, but target LDL<70. Intolerant to most statins, including atorvastatin and rosuvastatin. On maximum dose pravastatin at this time. Reminded that she has to take at bedtime for maximum efficiency.. Discussed PCS K9 inhibitors, seems to be financially prohibitive. 6. CKD: We may have to tolerate some degree of worsening renal function to prevent severe symptomatic heart failure. 7. Intertrigo: Gave prescription for antifungal, this always flares up whenever she receives steroids for her back  pain   Medication Adjustments/Labs and Tests Ordered: Current medicines are reviewed at length with the patient today.  Concerns regarding medicines are outlined above.  Medication changes, Labs and Tests ordered today are listed in the Patient Instructions below.  Patient Instructions  Dr Sallyanne Kuster has recommended making the following medication changes: 1. INCREASE Furosemide to 20 mg daily until you weigh 205 pounds, then resume usual dose 2. USE Miconazole powder as directed  Your physician recommends that you schedule a follow-up appointment in 3 months.  If you need a refill on your cardiac medications before your next appointment, please call your pharmacy.      Signed, Sanda Klein, MD  04/17/2017 8:08 PM    [address], [place], [place]  [address] Phone: [phone]; Fax: [phone]

## 2017-04-15 NOTE — Patient Instructions (Signed)
Dr Sallyanne Kuster has recommended making the following medication changes: 1. INCREASE Furosemide to 20 mg daily until you weigh 205 pounds, then resume usual dose 2. USE Miconazole powder as directed  Your physician recommends that you schedule a follow-up appointment in 3 months.  If you need a refill on your cardiac medications before your next appointment, please call your pharmacy.

## 2017-04-22 ENCOUNTER — Telehealth: Payer: Self-pay | Admitting: Cardiovascular Disease

## 2017-04-22 NOTE — Telephone Encounter (Signed)
Patient called with MD instructions to increase lasix. She voiced understanding.

## 2017-04-22 NOTE — Telephone Encounter (Signed)
New message     Pt c/o swelling: STAT is pt has developed SOB within 24 hours  1. How long have you been experiencing swelling? Since thursday  2. Where is the swelling located?  Gained 6lbs since Thursday, face (her eyes are almost swollen shut), legs and feet, hands   3.  Are you currently taking a "fluid pill"? yes  4.  Are you currently SOB? All the time , that is normal for her   5.  Have you traveled recently? no

## 2017-04-22 NOTE — Telephone Encounter (Signed)
Phone call transferred to triage d/t acute concerns regarding swelling/weight gain  Patient saw MD 04/15/17 at which time her lasix was increased with the goal for her weight to be 205 lbs - she is taking lasix 20mg  QAM and 10mg  QPM. She reports a 6lb weight gain since last Thursday 8/2. She states last night weight was 218lbs, decreased to 213lbs by this AM. She has swelling in her face, hands, leg, feet. She states this is NOT NEW. She had same symptoms on 7/30 when she saw MD. She wanted MD to know that the med change is not helping her swelling. She reports SOB but states she always has this and does not think it is worse.  She states she got advice from a nurse a while back to treat swelling by elevating legs and treating iced tea. She has tried this and lost weight overnight from this method.   Informed patient is it reassuring that her weight is decreasing despite the continued swelling. Offered PAOV this afternoon but patient states she has been to the doctor so much here recently - offered to message MD for advice. Unsure if diuretic dose should be increased further - patient concerned about renal function.

## 2017-04-22 NOTE — Telephone Encounter (Signed)
Ok to do the legs up iced tea, but I also recommend increasing the furosemide to 40 mg AM and 20 mg PM, until weight down to target MCr

## 2017-04-30 ENCOUNTER — Ambulatory Visit: Payer: PPO | Admitting: Physician Assistant

## 2017-05-09 ENCOUNTER — Other Ambulatory Visit: Payer: Self-pay | Admitting: Podiatry

## 2017-05-09 ENCOUNTER — Ambulatory Visit (INDEPENDENT_AMBULATORY_CARE_PROVIDER_SITE_OTHER): Payer: PPO

## 2017-05-09 ENCOUNTER — Ambulatory Visit (INDEPENDENT_AMBULATORY_CARE_PROVIDER_SITE_OTHER): Payer: PPO | Admitting: Podiatry

## 2017-05-09 DIAGNOSIS — G8929 Other chronic pain: Secondary | ICD-10-CM | POA: Diagnosis not present

## 2017-05-09 DIAGNOSIS — M25471 Effusion, right ankle: Secondary | ICD-10-CM | POA: Diagnosis not present

## 2017-05-09 DIAGNOSIS — M25571 Pain in right ankle and joints of right foot: Principal | ICD-10-CM

## 2017-05-09 DIAGNOSIS — M779 Enthesopathy, unspecified: Secondary | ICD-10-CM

## 2017-05-10 DIAGNOSIS — G8929 Other chronic pain: Secondary | ICD-10-CM | POA: Diagnosis not present

## 2017-05-10 DIAGNOSIS — M779 Enthesopathy, unspecified: Secondary | ICD-10-CM

## 2017-05-10 DIAGNOSIS — M25571 Pain in right ankle and joints of right foot: Secondary | ICD-10-CM | POA: Diagnosis not present

## 2017-05-10 MED ORDER — TRIAMCINOLONE ACETONIDE 10 MG/ML IJ SUSP
10.0000 mg | Freq: Once | INTRAMUSCULAR | Status: AC
  Administered 2017-05-10: 10 mg

## 2017-05-10 NOTE — Progress Notes (Signed)
Subjective:    Patient ID: Jill Preston, female   DOB: 83 y.o.   MRN: [id number]   HPI patient presents with chronic discomfort in the right ankle stating that it's been hurting her for a couple years and she saw another doctor who thought she had a broken ankle    Review of Systems  All other systems reviewed and are negative.       Objective:  Physical Exam  Constitutional: She appears well-developed and well-nourished.  Cardiovascular: Intact distal pulses.   Pulmonary/Chest: Breath sounds normal.  Musculoskeletal: Normal range of motion.  Neurological: She is alert.  Skin: Skin is warm.  Nursing note and vitals reviewed.  neurovascular status was found to be intact muscle strength was within normal limits with patient's right ankle been sore directly into the ankle joint with spurring noted mild diminishment of motion of the ankle and crepitus within the joint surface     Assessment:  Probability that this is probably arthritic issue versus an issue of fracture and that it's going to be difficult to make a big difference for her as far as quality of life       Plan:    H&P condition reviewed with Dr. March Rummage. We reviewed the x-rays and at this time he did do an ankle injection for her to try this see the response and then decide on other treatments that may be of benefit to her. Patient will be seen back by Dr. March Rummage in the next several weeks to see response and may consider scope of this  X-rays indicate that there is spurring of the ankle joint with moderate narrowing of the joint surface

## 2017-05-23 ENCOUNTER — Encounter: Payer: Self-pay | Admitting: Podiatry

## 2017-05-23 ENCOUNTER — Ambulatory Visit (INDEPENDENT_AMBULATORY_CARE_PROVIDER_SITE_OTHER): Payer: PPO | Admitting: Podiatry

## 2017-05-23 DIAGNOSIS — M19071 Primary osteoarthritis, right ankle and foot: Secondary | ICD-10-CM

## 2017-05-23 DIAGNOSIS — M659 Synovitis and tenosynovitis, unspecified: Secondary | ICD-10-CM

## 2017-05-23 DIAGNOSIS — M958 Other specified acquired deformities of musculoskeletal system: Secondary | ICD-10-CM | POA: Diagnosis not present

## 2017-05-28 ENCOUNTER — Telehealth: Payer: Self-pay | Admitting: *Deleted

## 2017-05-28 DIAGNOSIS — M19071 Primary osteoarthritis, right ankle and foot: Secondary | ICD-10-CM

## 2017-05-28 DIAGNOSIS — M659 Unspecified synovitis and tenosynovitis, unspecified site: Secondary | ICD-10-CM

## 2017-05-28 DIAGNOSIS — M958 Other specified acquired deformities of musculoskeletal system: Secondary | ICD-10-CM

## 2017-05-28 NOTE — Telephone Encounter (Addendum)
-----   Message from [name], DPM sent at 05/28/2017  7:40 AM EDT ----- Regarding: MRI  Can we order MRI R ankle without contrast?  Dx: R ankle arthritis, osteochondral defect of ankle, synovitis and tenosynovitis.  Thanks! Faxed required form, clinicals, orders and demographics to [employer]. Faxed to [place].

## 2017-05-28 NOTE — Telephone Encounter (Signed)
Patient had MRI in 2017 Any recent stents should be MRI compatible.

## 2017-05-28 NOTE — Progress Notes (Signed)
   Subjective:    Patient ID: Jill Preston, female    DOB: 1933/01/15, 83 y.o.   MRN: [id number]  HPI Chief Complaint  Patient presents with  . Ankle Pain    Follow-up; Right foot; both sides; pt stated, "The injection only lasted a day and a half; ankle still hurts"   83 y.o. female returns for the above complaint. Patient still having significant pain from her ankle. States the ankle injection only lasted for about a day. States she has tried every manner of bracing and nothing helps.  Review of Systems     Objective:   Physical Exam  There were no vitals filed for this visit. General AA&O x3. Normal mood and affect.  Vascular Dorsalis pedis and posterior tibial pulses  present 2+ bilaterally  Capillary refill normal to all digits. Pedal hair growth normal.  Neurologic Epicritic sensation grossly present.  Dermatologic No open lesions. Interspaces clear of maceration. Nails well groomed and normal in appearance.  Orthopedic: MMT 5/5 in dorsiflexion, plantarflexion, inversion, and eversion. R ankle joint with pain at anterior, medial, and lateral ankle gutters. Pain on ROM.      Assessment & Plan:  R Ankle Arthritis -Relief from injection, albeit for a short duration, suggests intra-articular pathology. -Discussed with patient performing MRI to further evaluate ankle cartilage. Patient has failed all other conservative therapy including injections, bracing, anti-inflammatory medications. R/u ankle OCD lesion.  F/u in 4-6 weeks.

## 2017-06-08 ENCOUNTER — Ambulatory Visit
Admission: RE | Admit: 2017-06-08 | Discharge: 2017-06-08 | Disposition: A | Discharge status: Home / Self Care | Payer: PPO | Source: Ambulatory Visit | Attending: Podiatry | Admitting: Podiatry

## 2017-06-08 DIAGNOSIS — M19071 Primary osteoarthritis, right ankle and foot: Secondary | ICD-10-CM | POA: Diagnosis not present

## 2017-06-27 ENCOUNTER — Ambulatory Visit (INDEPENDENT_AMBULATORY_CARE_PROVIDER_SITE_OTHER): Payer: PPO | Admitting: Podiatry

## 2017-06-27 DIAGNOSIS — M659 Synovitis and tenosynovitis, unspecified: Secondary | ICD-10-CM

## 2017-06-27 DIAGNOSIS — M19071 Primary osteoarthritis, right ankle and foot: Secondary | ICD-10-CM

## 2017-06-27 DIAGNOSIS — S86311D Strain of muscle(s) and tendon(s) of peroneal muscle group at lower leg level, right leg, subsequent encounter: Secondary | ICD-10-CM

## 2017-06-27 NOTE — Patient Instructions (Signed)
Pre-Operative Instructions  Congratulations, you have decided to take an important step towards improving your quality of life.  You can be assured that the doctors and staff at [facility] will be with you every step of the way.  Here are some important things you should know:  1. Plan to be at the surgery center/hospital at least 1 (one) hour prior to your scheduled time, unless otherwise directed by the surgical center/hospital staff.  You must have a responsible adult accompany you, remain during the surgery and drive you home.  Make sure you have directions to the surgical center/hospital to ensure you arrive on time. 2. If you are having surgery at [facility] or [facility], you will need a copy of your medical history and physical form from your family physician within one month prior to the date of surgery. We will give you a form for your primary physician to complete.  3. We make every effort to accommodate the date you request for surgery.  However, there are times where surgery dates or times have to be moved.  We will contact you as soon as possible if a change in schedule is required.   4. No aspirin/ibuprofen for one week before surgery.  If you are on aspirin, any non-steroidal anti-inflammatory medications (Mobic, Aleve, Ibuprofen) should not be taken seven (7) days prior to your surgery.  You make take Tylenol for pain prior to surgery.  5. Medications - If you are taking daily heart and blood pressure medications, seizure, reflux, allergy, asthma, anxiety, pain or diabetes medications, make sure you notify the surgery center/hospital before the day of surgery so they can tell you which medications you should take or avoid the day of surgery. 6. No food or drink after midnight the night before surgery unless directed otherwise by surgical center/hospital staff. 7. No alcoholic beverages 24-hours prior to surgery.  No smoking 24-hours prior or 24-hours after  surgery. 8. Wear loose pants or shorts. They should be loose enough to fit over bandages, boots, and casts. 9. Don't wear slip-on shoes. Sneakers are preferred. 10. Bring your boot with you to the surgery center/hospital.  Also bring crutches or a walker if your physician has prescribed it for you.  If you do not have this equipment, it will be provided for you after surgery. 11. If you have not been contacted by the surgery center/hospital by the day before your surgery, call to confirm the date and time of your surgery. 12. Leave-time from work may vary depending on the type of surgery you have.  Appropriate arrangements should be made prior to surgery with your employer. 13. Prescriptions will be provided immediately following surgery by your doctor.  Fill these as soon as possible after surgery and take the medication as directed. Pain medications will not be refilled on weekends and must be approved by the doctor. 14. Remove nail polish on the operative foot and avoid getting pedicures prior to surgery. 15. Wash the night before surgery.  The night before surgery wash the foot and leg well with water and the antibacterial soap provided. Be sure to pay special attention to beneath the toenails and in between the toes.  Wash for at least three (3) minutes. Rinse thoroughly with water and dry well with a towel.  Perform this wash unless told not to do so by your physician.  Enclosed: 1 Ice pack (please put in freezer the night before surgery)   1 Hibiclens skin cleaner     Pre-op instructions  If you have any questions regarding the instructions, please do not hesitate to call our office.  [place]: [address], [place], [place] [address] -- [phone]  [place]: [address] [place], [place] [address] -- [phone]  [place]: [address]  [place], [place] [address] -- [phone]  [place]: [address], [address], [place], [place] [address] -- [phone]  Website:  [email or web address] 

## 2017-06-27 NOTE — Progress Notes (Signed)
  Subjective:  Patient ID: Jill Preston, female    DOB: [birth year],  MRN: [id number]  Chief Complaint  Patient presents with  . Foot Pain    Right foot and lower leg still very painful. MRI results   83 y.o. female returns for the above complaint. Had MRI performed and wishes to discuss results reported significant pain at the outside of her right ankle and within the ankle joint itself. Has failed bracing and physical therapy.  Objective:  There were no vitals filed for this visit. General AA&O x3. Normal mood and affect.  Vascular Dorsalis pedis and posterior tibial pulses  present 2+ bilateral  Capillary refill normal to all digits. Pedal hair growth normal.  Neurologic Epicritic sensation grossly present.  Dermatologic No open lesions. Interspaces clear of maceration. Nails well groomed and normal in appearance.  Orthopedic: Pain on range of motion right ankle. Limited range of motion right ankle Tenderness palpation about the left peroneal tendons    Assessment & Plan:  Patient was evaluated and treated and all questions answered.  Split peroneus longus tear, R - MRI report reviewed with patient. -Discussed findings of a split tearin her peroneus longus tendon. Discussed conservative versus surgical options. Patient wishes to undergo surgical intervention. Discussed nonweightbearing course for approximately 4 weeks  Ankle arthritis right -Minimal joint space narrowing noted on MRI. Discussed performing ankle Arthroscopy with debridementn attempt to alleviate some of her ankle discomfort. Advised my hope is that she has less pain after surgery however I will likely be unable to alleviate all of her pain at her ankle.  Consent reviewed and signed for the above procedures. She will be performed at a date determined by the surgical coordinator  15 minutes of face to face time were spent with the patient. >50% of this was spent on counseling and coordination of care.  Specifically discussed with patient the above surgical plan and postoperative course. Patient wishes to proceed with surgery.

## 2017-06-28 DIAGNOSIS — I10 Essential (primary) hypertension: Secondary | ICD-10-CM | POA: Diagnosis not present

## 2017-07-02 ENCOUNTER — Telehealth: Payer: Self-pay | Admitting: Podiatry

## 2017-07-02 DIAGNOSIS — E039 Hypothyroidism, unspecified: Secondary | ICD-10-CM | POA: Diagnosis not present

## 2017-07-02 DIAGNOSIS — E782 Mixed hyperlipidemia: Secondary | ICD-10-CM | POA: Diagnosis not present

## 2017-07-02 DIAGNOSIS — Z01818 Encounter for other preprocedural examination: Secondary | ICD-10-CM | POA: Diagnosis not present

## 2017-07-02 DIAGNOSIS — I2510 Atherosclerotic heart disease of native coronary artery without angina pectoris: Secondary | ICD-10-CM | POA: Diagnosis not present

## 2017-07-02 DIAGNOSIS — N183 Chronic kidney disease, stage 3 (moderate): Secondary | ICD-10-CM | POA: Diagnosis not present

## 2017-07-02 DIAGNOSIS — I10 Essential (primary) hypertension: Secondary | ICD-10-CM | POA: Diagnosis not present

## 2017-07-03 NOTE — Telephone Encounter (Signed)
I told pt that if Dr. March Rummage had reviewed the MRI and discuss results and treatment in depth enough for her to schedule surgery, then if she was having pain and questions she would benefit from coming in and discussing the MRI and her surgery questions in more detail with Dr. March Rummage. Pt states understanding, and she doesn't want to have surgery and then turn around and have surgery again. I told pt I understood her concerns and transferred to scheduler to set up appt.

## 2017-07-03 NOTE — Telephone Encounter (Signed)
I'm seeing Dr. March Rummage and I know he found some things on my ankle on the MRI, but I wanted to know if he checked the inside of my ankle because I am having some pain there. I would appreciate a call back at [phone]. Thank you. Bye bye.

## 2017-07-04 ENCOUNTER — Telehealth: Payer: Self-pay | Admitting: *Deleted

## 2017-07-04 NOTE — Telephone Encounter (Signed)
"  I'm calling to let you know that due to the patient's medical history, the anesthesiologist has informed us that it's best for Jill Preston to have her surgery done at [facility].  She's scheduled for Wednesday, October 24."  Okay, I'll get it rescheduled, thank you.  I moved the surgery from [place] to [place].  The new start time is 12:45 pm on the same date.    (Please adjust Dr. Eleanora Neighbor schedule, maybe start him back in the office at 3:15 pm)

## 2017-07-04 NOTE — Progress Notes (Signed)
Chart reviewed with Dr. Linna Caprice, upcoming ankle surgery needs to be done at the main hospital. Pt is not a good candidate for outpatient surgery.  Deeann Dowse surgical scheduler notified.

## 2017-07-05 ENCOUNTER — Ambulatory Visit (INDEPENDENT_AMBULATORY_CARE_PROVIDER_SITE_OTHER): Payer: PPO | Admitting: Podiatry

## 2017-07-05 DIAGNOSIS — S86311D Strain of muscle(s) and tendon(s) of peroneal muscle group at lower leg level, right leg, subsequent encounter: Secondary | ICD-10-CM

## 2017-07-05 DIAGNOSIS — M76821 Posterior tibial tendinitis, right leg: Secondary | ICD-10-CM | POA: Diagnosis not present

## 2017-07-05 NOTE — Progress Notes (Signed)
  Subjective:  Patient ID: Jill Preston, female    DOB: 1933-06-13,  MRN: [id number]  Chief Complaint  Patient presents with  . Foot Pain    right ankle pain, medial, radiates up. worried that she has another split tendon on the inside   83 y.o. female returns for f/u and to further discuss surgery. States that she has pain on the inside of her ankle and wanted to talk about it before she underwent surgery so she didn't have to have surgery twice.  Objective:  There were no vitals filed for this visit.  General AA&O x3. Normal mood and affect.  Vascular Dorsalis pedis and posterior tibial pulses present 2+ bilateral  Capillary refill normal to all digits. Pedal hair growth normal.  Neurologic Epicritic sensation grossly present. Negative Tinel's L ankle.  Dermatologic No open lesions. Interspaces clear of maceration. Nails well groomed and normal in appearance.  Orthopedic: Pain on range of motion right ankle. Limited range of motion right ankle Tenderness palpation about the left peroneal tendons  Tenderness proximal to medial malleolus at the posterior tibial tendon.   Assessment & Plan:  Patient was evaluated and treated and all questions answered.  Posterior Tibial Tendonitis / Peroneal Tendonitis R, R Ankle Arthritis -Will continue with continued plan for R ankle arthroscopy, R peroneal tendon repair.  -Will also inject R posterior tibial tendon - patient did not wish to have done today. -CAM boot dispensed for PT tendonitis for protection and to promote healing of the tendon.  F/u after Surgery

## 2017-07-08 NOTE — Pre-Procedure Instructions (Signed)
Averee Harb Saylor  07/08/2017      [facility], [place] [address] Phone: [phone] Fax: [phone]  [facility] - [place], [place] [address] Phone: [phone] Fax: [phone]  [facility] [place], [place] - [address] [place] [address] Phone: [phone] Fax: [phone]  [facility] [place], [place] - [place] [address] [place] [address] Phone: [phone] Fax: [phone]    Your procedure is scheduled on Wednesday October 24.  Report to [facility] Admitting at 10:45 A.M.  Call this number if you have problems the morning of surgery:  [phone]   Remember:  Do not eat food or drink liquids after midnight.  CONTINUE TAKING all prescribed medications up until the day before surgery unless otherwise instructed.   Take these medicines the morning of surgery with A SIP OF WATER:  Isosorbide (Imdur) levothyroxine (synthroid) Acetaminophen (tylenol) if needed Benadryl if needed  7 days prior to surgery STOP taking any Aleve, Naproxen, Ibuprofen, Motrin, Advil, Goody's, BC's, all herbal medications, fish oil, and all vitamins  FOLLOW MD's instructions on stopping Aspirin    Do not wear jewelry, make-up or nail polish.  Do not wear lotions, powders, or perfumes, or deoderant.  Do not shave 48 hours prior to surgery.  Men may shave face and neck.  Do not bring valuables to the hospital.  [facility] is not responsible for any belongings or valuables.  Contacts, dentures or bridgework may not be worn into surgery.  Leave your suitcase in the car.  After surgery it may be brought to your room.  For patients admitted to the hospital, discharge time will be determined  by your treatment team.  Patients discharged the day of surgery will not be allowed to drive home.   Special instructions:    [place]- Preparing For Surgery  Before surgery, you can play an important role. Because skin is not sterile, your skin needs to be as free of germs as possible. You can reduce the number of germs on your skin by washing with CHG (chlorahexidine gluconate) Soap before surgery.  CHG is an antiseptic cleaner which kills germs and bonds with the skin to continue killing germs even after washing.  Please do not use if you have an allergy to CHG or antibacterial soaps. If your skin becomes reddened/irritated stop using the CHG.  Do not shave (including legs and underarms) for at least 48 hours prior to first CHG shower. It is OK to shave your face.  Please follow these instructions carefully.   1. Shower the NIGHT BEFORE SURGERY and the MORNING OF SURGERY with CHG.   2. If you chose to wash your hair, wash your hair first as usual with your normal shampoo.  3. After you shampoo, rinse your hair and body thoroughly to remove the shampoo.  4. Use CHG as you would any other liquid soap. You can apply CHG directly to the skin and wash gently with a scrungie or a clean washcloth.   5. Apply the CHG Soap to your body ONLY FROM THE NECK DOWN.  Do not use on open wounds or open sores. Avoid contact with your eyes, ears, mouth and genitals (private parts).  Wash Face and genitals (private parts)  with your normal soap.  6. Wash thoroughly, paying special attention to the area where your surgery will be performed.  7. Thoroughly rinse your body with warm water from the neck down.  8. DO NOT shower/wash with your normal soap after using and rinsing off the CHG Soap.  9. Pat yourself dry with a CLEAN TOWEL.  10. Wear CLEAN PAJAMAS to bed the night before surgery, wear comfortable clothes the morning of surgery  11. Place CLEAN SHEETS on your bed the night of your first  shower and DO NOT SLEEP WITH PETS.    Day of Surgery: Do not apply any deodorants/lotions. Please wear clean clothes to the hospital/surgery center.      Please read over the following fact sheets that you were given. Coughing and Deep Breathing and MRSA Information

## 2017-07-08 NOTE — Progress Notes (Signed)
Anesthesia Chart Review: Patient is a 83 year old female scheduled for right peroneal tendon repair, right ankle arthroscopy on 07/10/17 by Hardie Pulley, DPM. Anesthesia type is posted for MAC. Chart was moved from [facility] to [facility] main OR per anesthesiologist Dr. Roberts Gaudy.  History includes never smoker, CAD s/p PCI/BMS to LAD 00/8676, chronic diastolic CHF, chronic venous insufficiency, HLD, left iliofemoral DVT 09/05/14 (transferred to [facility]; vascular intervention deferred due to sepsis/E. Coli bacteremia/UTI; DVT treated with anticoagulation, complicated by retroperitoneal hematoma and hemorrhagic shock requiring IVC filter--removed 02/2015), hypothyroidism, CKD III, CVA '80, arthritis, skin cancer, urinary incontinence (s/p multiple urologic surgeries), cholecystectomy, hysterectomy, C5-7 ACDF 09/25/01, back surgery 07/2016 (Dr. Arnoldo Morale), septoplasty with inferior turbinate reduction 05/04/03. She saw vascular surgeon Dr. Deitra Mayo 08/04/14 for BP discrepancy in upper extremities and had evidence of left SCA stenosis by Doppler but no evidence of subclavian steal. BP in RUE recommended.   PCP is Dr. Merrilee Seashore. He completed a H&P and cleared her for surgery following 07/02/17 visit.  Cardiologist is Dr. Sanda Klein, last visit 04/15/17. She was seen for chest pain follow-up s/p 04/05/17 cath showing stable anatomy and continued medical therapy recommended. In regards to her CHF he wrote, "She has a narrow margin of compensation between heart failure and kidney failure." He increased her diuretics. Ideally he wanted her weight below 205 lb. Three month follow-up recommended. (Of note, she was seen by CT surgeon Dr. Lanelle Bal in 01/2015 for consideration of CABG for 70% LM, 60% CX. She was considered a high risk CABG candidate, and he recommended review by additional cardiologist. Ultimately, patient had RHC and FFR on 01/27/15 showing 60% ostial CX and 50% LM with FFR=0.91  suggesting LM lesion was not flow limiting. Also, continuous chest pain with no relief for months was felt to suggest non-cardiac etiology. Medical management of CAD recommended.)  Meds include Prevagen, ASA 81 mg, fenofibrate, diphenhydramine, Lasix, Homeopathic Products (Leg Cramp Relief), Imdur, levothyroxine, losartan-HCTZ, KCl, pravastatin, Nitro. (She is intolerable to beta blockers due to bradycardia.) Patient to contact surgeon regarding whether he would like her to hold ASA prior to surgery.   BP (!) 141/60   Pulse 65   Temp 36.6 C   Resp 20   Ht _0  (1.651 m)   Wt 214 lb 3.2 oz (97.2 kg)   SpO2 95%   BMI 35.64 kg/m   EKG 03/27/17 ([facility]): NSR, junctional ST depression, probably abnormal.  Cardiac cath 04/05/17:  LM lesion, 70 %stenosed.  Mid LAD lesion, 0 %stenosed at site of prior stent.  Ost Cx lesion, 60 %stenosed.  Prox RCA-1 lesion, 20 %stenosed.  Prox RCA-2 lesion, 30 %stenosed.  The left ventricular systolic function is normal.  LV end diastolic pressure is normal.  The left ventricular ejection fraction is 55-65% by visual estimate.  1. 70% eccentric distal left main stenosis. Angiographically similar to 2016. FFR 0.89 into the LAD and 0.86 into the LCx suggesting that this lesion is not flow limiting.  2. Otherwise nonobstructive CAD. Patent stent in LAD. 3. Normal LV function 4. Normal LVED Plan: recommend continued medical therapy.  Nuclear stress test 11/20/16:  The left ventricular ejection fraction is normal (55-65%).  Nuclear stress EF: 62%.  T wave inversion of 4 mm was noted during stress in the II, III, aVF, V6, V5 and V4 leads. T wave inversion persisted.  There was no ST segment deviation noted during stress.  This is a low risk study.  No reversible  ischemia. RV uptake noted which could suggest elevated PA pressure. LVEF 62% with normal wall motion. This is a low risk study.  Echo 07/05/14: Study Conclusion - Left  ventricle: Systolic function was vigorous. The estimated ejection fraction was in the range of 65% to 70%. - Mitral valve: There was mild regurgitation.  Carotid U/S 08/04/14: BICA stenosis < 40%. Bilateral subclavian arteries patent and multiphasic. No plaque visualized. Right SCA tortuous at proximal segment.   PFTs 02/09/15: FVC 2.67 (101%0, FEV1 2.21 (112%), DLCO unc 19.32 (79%).   Preoperative labs noted. Cr 1.10. Glucose 013. CBC WNL.    Patient denied SOB, fever, cough, chest pain at PAT. She denied chest pain since having her cath in July. If no acute changes then I would anticipate that she can proceed as planned.  [facility] Short Stay Center/Anesthesiology Phone [phone] 07/09/2017 12:28 PM

## 2017-07-09 ENCOUNTER — Encounter (HOSPITAL_COMMUNITY): Payer: Self-pay

## 2017-07-09 ENCOUNTER — Telehealth: Payer: Self-pay | Admitting: *Deleted

## 2017-07-09 ENCOUNTER — Encounter (HOSPITAL_COMMUNITY)
Admission: RE | Admit: 2017-07-09 | Discharge: 2017-07-09 | Disposition: A | Discharge status: Home / Self Care | Payer: PPO | Source: Ambulatory Visit | Attending: Podiatry | Admitting: Podiatry

## 2017-07-09 DIAGNOSIS — M19071 Primary osteoarthritis, right ankle and foot: Secondary | ICD-10-CM | POA: Diagnosis not present

## 2017-07-09 DIAGNOSIS — M76821 Posterior tibial tendinitis, right leg: Secondary | ICD-10-CM | POA: Diagnosis not present

## 2017-07-09 DIAGNOSIS — I5032 Chronic diastolic (congestive) heart failure: Secondary | ICD-10-CM | POA: Diagnosis not present

## 2017-07-09 DIAGNOSIS — Z6835 Body mass index (BMI) 35.0-35.9, adult: Secondary | ICD-10-CM | POA: Diagnosis not present

## 2017-07-09 DIAGNOSIS — Z86718 Personal history of other venous thrombosis and embolism: Secondary | ICD-10-CM | POA: Diagnosis not present

## 2017-07-09 DIAGNOSIS — E785 Hyperlipidemia, unspecified: Secondary | ICD-10-CM | POA: Diagnosis not present

## 2017-07-09 DIAGNOSIS — S96811A Strain of other specified muscles and tendons at ankle and foot level, right foot, initial encounter: Secondary | ICD-10-CM | POA: Diagnosis not present

## 2017-07-09 DIAGNOSIS — I130 Hypertensive heart and chronic kidney disease with heart failure and stage 1 through stage 4 chronic kidney disease, or unspecified chronic kidney disease: Secondary | ICD-10-CM | POA: Diagnosis not present

## 2017-07-09 DIAGNOSIS — Z955 Presence of coronary angioplasty implant and graft: Secondary | ICD-10-CM | POA: Diagnosis not present

## 2017-07-09 DIAGNOSIS — I2510 Atherosclerotic heart disease of native coronary artery without angina pectoris: Secondary | ICD-10-CM | POA: Diagnosis not present

## 2017-07-09 DIAGNOSIS — Z7982 Long term (current) use of aspirin: Secondary | ICD-10-CM | POA: Diagnosis not present

## 2017-07-09 DIAGNOSIS — Z8673 Personal history of transient ischemic attack (TIA), and cerebral infarction without residual deficits: Secondary | ICD-10-CM | POA: Diagnosis not present

## 2017-07-09 DIAGNOSIS — X58XXXA Exposure to other specified factors, initial encounter: Secondary | ICD-10-CM | POA: Diagnosis not present

## 2017-07-09 DIAGNOSIS — Z79899 Other long term (current) drug therapy: Secondary | ICD-10-CM | POA: Diagnosis not present

## 2017-07-09 DIAGNOSIS — I739 Peripheral vascular disease, unspecified: Secondary | ICD-10-CM | POA: Diagnosis not present

## 2017-07-09 DIAGNOSIS — N183 Chronic kidney disease, stage 3 (moderate): Secondary | ICD-10-CM | POA: Diagnosis not present

## 2017-07-09 DIAGNOSIS — I129 Hypertensive chronic kidney disease with stage 1 through stage 4 chronic kidney disease, or unspecified chronic kidney disease: Secondary | ICD-10-CM | POA: Diagnosis not present

## 2017-07-09 DIAGNOSIS — I340 Nonrheumatic mitral (valve) insufficiency: Secondary | ICD-10-CM | POA: Diagnosis not present

## 2017-07-09 DIAGNOSIS — M25571 Pain in right ankle and joints of right foot: Secondary | ICD-10-CM | POA: Diagnosis present

## 2017-07-09 DIAGNOSIS — E039 Hypothyroidism, unspecified: Secondary | ICD-10-CM | POA: Diagnosis not present

## 2017-07-09 HISTORY — DX: Dyspnea, unspecified: R06.00

## 2017-07-09 LAB — CBC
HCT: 44.9 % (ref 36.0–46.0)
Hemoglobin: 14.7 g/dL (ref 12.0–15.0)
MCH: 32 pg (ref 26.0–34.0)
MCHC: 32.7 g/dL (ref 30.0–36.0)
MCV: 97.8 fL (ref 78.0–100.0)
Platelets: 189 10*3/uL (ref 150–400)
RBC: 4.59 MIL/uL (ref 3.87–5.11)
RDW: 13.1 % (ref 11.5–15.5)
WBC: 4.8 10*3/uL (ref 4.0–10.5)

## 2017-07-09 LAB — BASIC METABOLIC PANEL
Anion gap: 9 (ref 5–15)
BUN: 18 mg/dL (ref 6–20)
CO2: 28 mmol/L (ref 22–32)
Calcium: 9.7 mg/dL (ref 8.9–10.3)
Chloride: 102 mmol/L (ref 101–111)
Creatinine, Ser: 1.1 mg/dL — ABNORMAL HIGH (ref 0.44–1.00)
GFR calc Af Amer: 53 mL/min — ABNORMAL LOW (ref >60)
GFR calc non Af Amer: 46 mL/min — ABNORMAL LOW (ref >60)
Glucose, Bld: 103 mg/dL — ABNORMAL HIGH (ref 65–99)
Potassium: 3.9 mmol/L (ref 3.5–5.1)
Sodium: 139 mmol/L (ref 135–145)

## 2017-07-09 LAB — SURGICAL PCR SCREEN
MRSA, PCR: NEGATIVE
Staphylococcus aureus: NEGATIVE

## 2017-07-09 NOTE — Progress Notes (Signed)
PCP - Merrilee Seashore Cardiologist - Dr. Sallyanne Kuster  EKG - 03/27/17 Stress Test - 11/20/16 ECHO - 07/05/14 Cardiac Cath - 04/05/17  Pt was not given any instructions on whether to stop taking Aspirin. Pt states she will call Dr. Eleanora Neighbor office or "run by" his office on the way home to ask whether she needs to take aspirin tonight/tomorrow.   Patient denies shortness of breath, fever, cough and chest pain at PAT appointment. Pt denies having chest pain since her cath was done in July.   Patient verbalized understanding of instructions that were given to them at the PAT appointment. Patient was also instructed that they will need to review over the PAT instructions again at home before surgery.

## 2017-07-09 NOTE — Telephone Encounter (Addendum)
Pt presented to office stating she is having surgery with Dr. March Rummage tomorrow and is she to continue the baby aspirin. I told her not to take the one today or tomorrow but to begin again on 07/11/2017. Pt states understanding.

## 2017-07-10 ENCOUNTER — Ambulatory Visit (HOSPITAL_COMMUNITY): Payer: PPO | Admitting: Anesthesiology

## 2017-07-10 ENCOUNTER — Encounter: Payer: Self-pay | Admitting: Podiatry

## 2017-07-10 ENCOUNTER — Encounter (HOSPITAL_COMMUNITY): Payer: Self-pay | Admitting: *Deleted

## 2017-07-10 ENCOUNTER — Other Ambulatory Visit: Payer: Self-pay | Admitting: Podiatry

## 2017-07-10 ENCOUNTER — Ambulatory Visit (HOSPITAL_COMMUNITY)
Admission: RE | Admit: 2017-07-10 | Discharge: 2017-07-10 | Disposition: A | Discharge status: Home / Self Care | Payer: PPO | Source: Ambulatory Visit | Attending: Podiatry | Admitting: Podiatry

## 2017-07-10 ENCOUNTER — Encounter (HOSPITAL_COMMUNITY)
Admission: RE | Disposition: A | Discharge status: Home / Self Care | Payer: Self-pay | Source: Ambulatory Visit | Attending: Podiatry

## 2017-07-10 ENCOUNTER — Ambulatory Visit (HOSPITAL_COMMUNITY): Payer: PPO | Admitting: Vascular Surgery

## 2017-07-10 DIAGNOSIS — N183 Chronic kidney disease, stage 3 (moderate): Secondary | ICD-10-CM | POA: Diagnosis not present

## 2017-07-10 DIAGNOSIS — I5032 Chronic diastolic (congestive) heart failure: Secondary | ICD-10-CM | POA: Insufficient documentation

## 2017-07-10 DIAGNOSIS — Z7982 Long term (current) use of aspirin: Secondary | ICD-10-CM | POA: Diagnosis not present

## 2017-07-10 DIAGNOSIS — I130 Hypertensive heart and chronic kidney disease with heart failure and stage 1 through stage 4 chronic kidney disease, or unspecified chronic kidney disease: Secondary | ICD-10-CM | POA: Diagnosis not present

## 2017-07-10 DIAGNOSIS — I2510 Atherosclerotic heart disease of native coronary artery without angina pectoris: Secondary | ICD-10-CM | POA: Insufficient documentation

## 2017-07-10 DIAGNOSIS — M19071 Primary osteoarthritis, right ankle and foot: Secondary | ICD-10-CM | POA: Insufficient documentation

## 2017-07-10 DIAGNOSIS — M76821 Posterior tibial tendinitis, right leg: Secondary | ICD-10-CM | POA: Insufficient documentation

## 2017-07-10 DIAGNOSIS — X58XXXA Exposure to other specified factors, initial encounter: Secondary | ICD-10-CM | POA: Insufficient documentation

## 2017-07-10 DIAGNOSIS — Z86718 Personal history of other venous thrombosis and embolism: Secondary | ICD-10-CM | POA: Diagnosis not present

## 2017-07-10 DIAGNOSIS — Z6835 Body mass index (BMI) 35.0-35.9, adult: Secondary | ICD-10-CM | POA: Insufficient documentation

## 2017-07-10 DIAGNOSIS — Z8673 Personal history of transient ischemic attack (TIA), and cerebral infarction without residual deficits: Secondary | ICD-10-CM | POA: Diagnosis not present

## 2017-07-10 DIAGNOSIS — I739 Peripheral vascular disease, unspecified: Secondary | ICD-10-CM | POA: Insufficient documentation

## 2017-07-10 DIAGNOSIS — I129 Hypertensive chronic kidney disease with stage 1 through stage 4 chronic kidney disease, or unspecified chronic kidney disease: Secondary | ICD-10-CM | POA: Diagnosis not present

## 2017-07-10 DIAGNOSIS — Z955 Presence of coronary angioplasty implant and graft: Secondary | ICD-10-CM | POA: Insufficient documentation

## 2017-07-10 DIAGNOSIS — M659 Synovitis and tenosynovitis, unspecified: Secondary | ICD-10-CM | POA: Diagnosis not present

## 2017-07-10 DIAGNOSIS — S96811A Strain of other specified muscles and tendons at ankle and foot level, right foot, initial encounter: Secondary | ICD-10-CM | POA: Diagnosis not present

## 2017-07-10 DIAGNOSIS — E785 Hyperlipidemia, unspecified: Secondary | ICD-10-CM | POA: Insufficient documentation

## 2017-07-10 DIAGNOSIS — Z79899 Other long term (current) drug therapy: Secondary | ICD-10-CM | POA: Insufficient documentation

## 2017-07-10 DIAGNOSIS — E039 Hypothyroidism, unspecified: Secondary | ICD-10-CM | POA: Insufficient documentation

## 2017-07-10 DIAGNOSIS — I340 Nonrheumatic mitral (valve) insufficiency: Secondary | ICD-10-CM | POA: Insufficient documentation

## 2017-07-10 DIAGNOSIS — S86311A Strain of muscle(s) and tendon(s) of peroneal muscle group at lower leg level, right leg, initial encounter: Secondary | ICD-10-CM | POA: Diagnosis not present

## 2017-07-10 HISTORY — PX: TENDON REPAIR: SHX5111

## 2017-07-10 HISTORY — PX: ANKLE ARTHROSCOPY: SHX545

## 2017-07-10 SURGERY — TENDON REPAIR
Anesthesia: General | Laterality: Right

## 2017-07-10 MED ORDER — CLINDAMYCIN HCL 300 MG PO CAPS: 300.0000 mg | ORAL_CAPSULE | Freq: Two times a day (BID) | ORAL | 0 refills | Status: AC

## 2017-07-10 MED ORDER — DIPHENHYDRAMINE HCL 50 MG/ML IJ SOLN
INTRAMUSCULAR | Status: DC | PRN
  Administered 2017-07-10: 12.5 mg via INTRAVENOUS

## 2017-07-10 MED ORDER — DEXTROSE 5 % IV SOLN
INTRAVENOUS | Status: DC | PRN
  Administered 2017-07-10: 25 ug/min via INTRAVENOUS

## 2017-07-10 MED ORDER — FENTANYL CITRATE (PF) 100 MCG/2ML IJ SOLN
25.0000 ug | INTRAMUSCULAR | Status: DC | PRN
Start: 2017-07-10 — End: 2017-07-10

## 2017-07-10 MED ORDER — OXYCODONE-ACETAMINOPHEN 10-325 MG PO TABS: 1.0000 | ORAL_TABLET | ORAL | 0 refills | Status: DC | PRN

## 2017-07-10 MED ORDER — SODIUM CHLORIDE 0.9 % IR SOLN
Status: DC | PRN
  Administered 2017-07-10: 3000 mL
  Administered 2017-07-10: 1000 mL

## 2017-07-10 MED ORDER — ONDANSETRON HCL 4 MG PO TABS: 4.0000 mg | ORAL_TABLET | Freq: Every day | ORAL | 1 refills | Status: DC | PRN

## 2017-07-10 MED ORDER — FENTANYL CITRATE (PF) 250 MCG/5ML IJ SOLN
INTRAMUSCULAR | Status: DC | PRN
  Administered 2017-07-10 (×3): 50 ug via INTRAVENOUS

## 2017-07-10 MED ORDER — DEXAMETHASONE SODIUM PHOSPHATE 10 MG/ML IJ SOLN
INTRAMUSCULAR | Status: DC | PRN
  Administered 2017-07-10: 10 mg via INTRAVENOUS

## 2017-07-10 MED ORDER — FENTANYL CITRATE (PF) 250 MCG/5ML IJ SOLN
INTRAMUSCULAR | Status: AC
  Filled 2017-07-10: qty 5

## 2017-07-10 MED ORDER — EPINEPHRINE PF 1 MG/ML IJ SOLN
INTRAMUSCULAR | Status: AC
  Filled 2017-07-10: qty 1

## 2017-07-10 MED ORDER — LACTATED RINGERS IV SOLN
INTRAVENOUS | Status: DC
  Administered 2017-07-10 (×2): via INTRAVENOUS

## 2017-07-10 MED ORDER — CHLORHEXIDINE GLUCONATE 4 % EX LIQD: 60.0000 mL | Freq: Once | CUTANEOUS | Status: DC

## 2017-07-10 MED ORDER — ROCURONIUM BROMIDE 100 MG/10ML IV SOLN
INTRAVENOUS | Status: DC | PRN
  Administered 2017-07-10: 50 mg via INTRAVENOUS

## 2017-07-10 MED ORDER — SUGAMMADEX SODIUM 200 MG/2ML IV SOLN
INTRAVENOUS | Status: DC | PRN
  Administered 2017-07-10: 200 mg via INTRAVENOUS

## 2017-07-10 MED ORDER — ONDANSETRON HCL 4 MG/2ML IJ SOLN
INTRAMUSCULAR | Status: DC | PRN
  Administered 2017-07-10: 4 mg via INTRAVENOUS

## 2017-07-10 MED ORDER — METHYLPREDNISOLONE ACETATE 40 MG/ML IJ SUSP
INTRAMUSCULAR | Status: AC
  Filled 2017-07-10: qty 1

## 2017-07-10 MED ORDER — PROPOFOL 10 MG/ML IV BOLUS
INTRAVENOUS | Status: AC
  Filled 2017-07-10: qty 20

## 2017-07-10 MED ORDER — SUCCINYLCHOLINE CHLORIDE 20 MG/ML IJ SOLN
INTRAMUSCULAR | Status: DC | PRN
  Administered 2017-07-10: 100 mg via INTRAVENOUS

## 2017-07-10 MED ORDER — PROPOFOL 10 MG/ML IV BOLUS
INTRAVENOUS | Status: DC | PRN
  Administered 2017-07-10: 150 mg via INTRAVENOUS

## 2017-07-10 MED ORDER — CLINDAMYCIN PHOSPHATE 900 MG/50ML IV SOLN
900.0000 mg | INTRAVENOUS | Status: AC
  Administered 2017-07-10: 900 mg via INTRAVENOUS
  Filled 2017-07-10: qty 50

## 2017-07-10 MED ORDER — LIDOCAINE 2% (20 MG/ML) 5 ML SYRINGE
INTRAMUSCULAR | Status: DC | PRN
  Administered 2017-07-10: 60 mg via INTRAVENOUS

## 2017-07-10 MED ORDER — PHENYLEPHRINE HCL 10 MG/ML IJ SOLN: INTRAVENOUS | Status: DC | PRN

## 2017-07-10 MED ORDER — BUPIVACAINE HCL (PF) 0.5 % IJ SOLN
INTRAMUSCULAR | Status: AC
  Filled 2017-07-10: qty 30

## 2017-07-10 MED ORDER — BUPIVACAINE HCL (PF) 0.5 % IJ SOLN
INTRAMUSCULAR | Status: DC | PRN
  Administered 2017-07-10: 10 mL
  Administered 2017-07-10: 20 mL

## 2017-07-10 MED ORDER — PHENYLEPHRINE 40 MCG/ML (10ML) SYRINGE FOR IV PUSH (FOR BLOOD PRESSURE SUPPORT)
PREFILLED_SYRINGE | INTRAVENOUS | Status: DC | PRN
  Administered 2017-07-10: 80 ug via INTRAVENOUS

## 2017-07-10 SURGICAL SUPPLY — 76 items
BANDAGE ACE 4X5 VEL STRL LF (GAUZE/BANDAGES/DRESSINGS) IMPLANT
BANDAGE ELASTIC 6 VELCRO ST LF (GAUZE/BANDAGES/DRESSINGS) ×2 IMPLANT
BLADE CUDA 5.5 (BLADE) IMPLANT
BLADE GREAT WHITE 4.2 (BLADE) IMPLANT
BLADE INCISOR PLUS 5.5 (BLADE) IMPLANT
BNDG COHESIVE 6X5 TAN STRL LF (GAUZE/BANDAGES/DRESSINGS) ×2 IMPLANT
BNDG ESMARK 4X9 LF (GAUZE/BANDAGES/DRESSINGS) ×2 IMPLANT
BNDG GAUZE ELAST 4 BULKY (GAUZE/BANDAGES/DRESSINGS) ×2 IMPLANT
BUR CUDA 2.9 (BURR) ×2 IMPLANT
BUR OVAL 4.0 (BURR) IMPLANT
CHLORAPREP W/TINT 26ML (MISCELLANEOUS) ×2 IMPLANT
COVER SURGICAL LIGHT HANDLE (MISCELLANEOUS) ×4 IMPLANT
CUFF TOURNIQUET SINGLE 18IN (TOURNIQUET CUFF) IMPLANT
CUFF TOURNIQUET SINGLE 34IN LL (TOURNIQUET CUFF) ×2 IMPLANT
CUFF TOURNIQUET SINGLE 44IN (TOURNIQUET CUFF) IMPLANT
DRAPE ARTHROSCOPY W/POUCH 114 (DRAPES) ×2 IMPLANT
DRAPE C-ARM MINI 42X72 WSTRAPS (DRAPES) IMPLANT
DRAPE U-SHAPE 47X51 STRL (DRAPES) ×2 IMPLANT
DRSG EMULSION OIL 3X3 NADH (GAUZE/BANDAGES/DRESSINGS) ×2 IMPLANT
DRSG PAD ABDOMINAL 8X10 ST (GAUZE/BANDAGES/DRESSINGS) ×4 IMPLANT
DURAPREP 26ML APPLICATOR (WOUND CARE) ×2 IMPLANT
ELECT CAUTERY BLADE 6.4 (BLADE) ×2 IMPLANT
ELECT REM PT RETURN 9FT ADLT (ELECTROSURGICAL) ×2
ELECTRODE REM PT RTRN 9FT ADLT (ELECTROSURGICAL) ×1 IMPLANT
GAUZE SPONGE 4X4 12PLY STRL (GAUZE/BANDAGES/DRESSINGS) ×4 IMPLANT
GAUZE SPONGE 4X4 12PLY STRL LF (GAUZE/BANDAGES/DRESSINGS) ×2 IMPLANT
GAUZE XEROFORM 1X8 LF (GAUZE/BANDAGES/DRESSINGS) ×2 IMPLANT
GLOVE BIO SURGEON STRL SZ7.5 (GLOVE) ×2 IMPLANT
GLOVE BIOGEL PI IND STRL 8 (GLOVE) ×1 IMPLANT
GLOVE BIOGEL PI IND STRL 9 (GLOVE) ×1 IMPLANT
GLOVE BIOGEL PI INDICATOR 8 (GLOVE) ×1
GLOVE BIOGEL PI INDICATOR 9 (GLOVE) ×1
GLOVE SURG ORTHO 9.0 STRL STRW (GLOVE) ×2 IMPLANT
GOWN STRL REUS W/ TWL LRG LVL3 (GOWN DISPOSABLE) ×1 IMPLANT
GOWN STRL REUS W/ TWL XL LVL3 (GOWN DISPOSABLE) ×3 IMPLANT
GOWN STRL REUS W/TWL LRG LVL3 (GOWN DISPOSABLE) ×2
GOWN STRL REUS W/TWL XL LVL3 (GOWN DISPOSABLE) ×6
KIT BASIN OR (CUSTOM PROCEDURE TRAY) ×2 IMPLANT
KIT ROOM TURNOVER OR (KITS) ×2 IMPLANT
MANIFOLD NEPTUNE II (INSTRUMENTS) ×2 IMPLANT
NEEDLE 18GX1X1/2 (RX/OR ONLY) (NEEDLE) ×2 IMPLANT
NEEDLE HYPO 25GX1X1/2 BEV (NEEDLE) IMPLANT
NEEDLE SPNL 18GX3.5 QUINCKE PK (NEEDLE) ×2 IMPLANT
NS IRRIG 1000ML POUR BTL (IV SOLUTION) ×2 IMPLANT
PACK ARTHROSCOPY DSU (CUSTOM PROCEDURE TRAY) ×2 IMPLANT
PACK ORTHO EXTREMITY (CUSTOM PROCEDURE TRAY) ×2 IMPLANT
PAD ARMBOARD 7.5X6 YLW CONV (MISCELLANEOUS) ×4 IMPLANT
PADDING CAST COTTON 6X4 STRL (CAST SUPPLIES) ×2 IMPLANT
SCRUB BETADINE 4OZ XXX (MISCELLANEOUS) IMPLANT
SET ARTHROSCOPY TUBING (MISCELLANEOUS) ×2
SET ARTHROSCOPY TUBING LN (MISCELLANEOUS) ×1 IMPLANT
SET CYSTO W/LG BORE CLAMP LF (SET/KITS/TRAYS/PACK) ×2 IMPLANT
SOL PREP POV-IOD 4OZ 10% (MISCELLANEOUS) IMPLANT
SPONGE LAP 4X18 X RAY DECT (DISPOSABLE) ×2 IMPLANT
SUT ETHIBOND 3-0 V-5 (SUTURE) ×2 IMPLANT
SUT ETHIBOND NAB CT1 #1 30IN (SUTURE) ×2 IMPLANT
SUT ETHILON 4 0 PS 2 18 (SUTURE) ×6 IMPLANT
SUT MNCRL AB 3-0 PS2 18 (SUTURE) IMPLANT
SUT PROLENE 6 0 BV (SUTURE) ×2 IMPLANT
SUT SILK 2 0 PERMA HAND 18 BK (SUTURE) ×2 IMPLANT
SUT SILK 2 0 TIES 10X30 (SUTURE) ×2 IMPLANT
SUT VIC AB 2-0 CT1 27 (SUTURE)
SUT VIC AB 2-0 CT1 TAPERPNT 27 (SUTURE) IMPLANT
SUT VIC AB 2-0 FS1 27 (SUTURE) ×2 IMPLANT
SUT VIC AB 3-0 FS2 27 (SUTURE) ×4 IMPLANT
SYR 20CC LL (SYRINGE) ×2 IMPLANT
SYR CONTROL 10ML LL (SYRINGE) IMPLANT
TAPE STRIPS DRAPE STRL (GAUZE/BANDAGES/DRESSINGS) IMPLANT
TOWEL GREEN STERILE (TOWEL DISPOSABLE) ×2 IMPLANT
TOWEL GREEN STERILE FF (TOWEL DISPOSABLE) ×2 IMPLANT
TOWEL OR 17X24 6PK STRL BLUE (TOWEL DISPOSABLE) ×2 IMPLANT
TOWEL OR 17X26 10 PK STRL BLUE (TOWEL DISPOSABLE) ×2 IMPLANT
TUBE CONNECTING 12X1/4 (SUCTIONS) ×2 IMPLANT
WAND HAND CNTRL MULTIVAC 90 (MISCELLANEOUS) IMPLANT
WATER STERILE IRR 1000ML POUR (IV SOLUTION) ×2 IMPLANT
YANKAUER SUCT BULB TIP NO VENT (SUCTIONS) ×2 IMPLANT

## 2017-07-10 NOTE — H&P (View-Only) (Signed)
  Subjective:  Patient ID: Jill Preston, female    DOB: 11-07-1932,  MRN: [id number]  Chief Complaint  Patient presents with  . Foot Pain    right ankle pain, medial, radiates up. worried that she has another split tendon on the inside   82 y.o. female returns for f/u and to further discuss surgery. States that she has pain on the inside of her ankle and wanted to talk about it before she underwent surgery so she didn't have to have surgery twice.  Objective:  There were no vitals filed for this visit.  General AA&O x3. Normal mood and affect.  Vascular Dorsalis pedis and posterior tibial pulses present 2+ bilateral  Capillary refill normal to all digits. Pedal hair growth normal.  Neurologic Epicritic sensation grossly present. Negative Tinel's L ankle.  Dermatologic No open lesions. Interspaces clear of maceration. Nails well groomed and normal in appearance.  Orthopedic: Pain on range of motion right ankle. Limited range of motion right ankle Tenderness palpation about the left peroneal tendons  Tenderness proximal to medial malleolus at the posterior tibial tendon.   Assessment & Plan:  Patient was evaluated and treated and all questions answered.  Posterior Tibial Tendonitis / Peroneal Tendonitis R, R Ankle Arthritis -Will continue with continued plan for R ankle arthroscopy, R peroneal tendon repair.  -Will also inject R posterior tibial tendon - patient did not wish to have done today. -CAM boot dispensed for PT tendonitis for protection and to promote healing of the tendon.  F/u after Surgery

## 2017-07-10 NOTE — Op Note (Signed)
Patient Name: Jill Preston DOB: Jun 06, 1933  MRN: [id number]  Date of Service: 07/10/17 Surgeon: Dr. Hardie Pulley, DPM Assistants: None Pre-operative Diagnosis:   1) Peroneus longus split tear R  2) Ankle Arthritis R  3) PT Tendonitis R Post-operative Diagnosis:  Same Procedures:             1) Repair R Peroneal Tendon  2) R Ankle Arthroscopy with limited debridement  3) Injection R PT Tendon Pathology/Specimens:             1) None Anesthesia: General; post-op popliteal Hemostasis: Anatomic Estimated Blood Loss: minimal Materials: None Medications: 30 ccs Marcaine; 1 cc DepoMedrol Complications: None  Indications for Procedure:   Procedure in Detail: Patient was identified in pre-operative holding area. Formal consent was signed and the right lower extremity was marked. Patient was brought back to the operating room and placed on the operating room table in the supine position. Anesthesia was induced. The right lower extremity was prepped and draped in the usual sterile fashion. Timeout was taken to confirm patient name, laterality, and procedure prior to incision.    Procedure: PT Tendon Injection Attention was then directed to the medial aspect of the right ankle. The posterior tibial tendon was palpated and injection consisting of 1 mL of 0.5% Marcaine and 1 mL Depo-Medrol was injected into the area around the posterior tibial tendon. Aspiration was performed to insure no intravascular injection.  Procedure: R Peroneal Tendon Repair Attention was then directed to the lateral aspect of the right ankle. The peroneal tendons and posterior part of the fibula were palpated and marked. Incision was made overlying the peroneal tendons posterior to the lateral malleolus. Dissection was carried down through skin and subcutaneous tissue with care to avoid all vital neurovascular structures. The sural nerve was identified and retracted from view. The peroneal tendon sheath was  identified. A small nick was made in the peroneal tendon sheath and the tendon groove director was used to incise the tendon sheath. The peritenon was then gently reflected from the tendon. The previous longus tendon was flat and broad with evidence of a central longitudinal tear. The peroneus brevis appeared intact. The full extent of the previous longus tear was identified and the tear was found to be 6.5 cm in length. The longitudinal tear with 3-0 Ethibond in running fashion and the tendon was tubularized. The area was copiously irrigated. The peroneal tendon sheath was repaired with 3-0 Vicryl. Deep closure was performed with 3-0 Vicryl. The skin was closed with 4-0 nylon in horizontal mattress and simple fashion.  Procedure: R Ankle Arthroscopy with Limited Debridement Attention was then directed to anterior aspect of the R ankle. the ankle joint was insufflated with 20 mL of half percent Marcaine plain with dorsiflexion noted during insufflation A standard medial ankle arthroscopy portal was made medial to the tibialis anterior tendon. Blunt dissection was carried down with a trocar and cannula. A 2.7 mm scope was introduced into the ankle joint. The ankle joint was noted to be rather tight. The anterior aspect of the joint was explored. Synovitic tissue was identified but the cartilage appeared mostly intact. A standard lateral portal was made in similar fashion. A shaver was introduced into the joint. Synovitic tissue was gently debrided with the shaver. All instrumentation was then removed and the portals closed with 4-0 nylon.  The foot was then dressed with xeroform, 4x4, kerlix, and ACE bandage. Patient tolerated the procedure well. She will be placed in a CAM  boot that she brought from home. She is to be non-weightbearing to the RLE.  Disposition: Following a period of post-operative monitoring, patient will be transferred back home.

## 2017-07-10 NOTE — Anesthesia Preprocedure Evaluation (Addendum)
Anesthesia Evaluation  Patient identified by MRN, date of birth, ID band Patient awake    Reviewed: Allergy & Precautions, NPO status , Patient's Chart, lab work & pertinent test results  History of Anesthesia Complications Negative for: history of anesthetic complications  Airway Mallampati: II  TM Distance: >3 FB Neck ROM: Full    Dental  (+) Lower Dentures, Upper Dentures   Pulmonary shortness of breath,    breath sounds clear to auscultation       Cardiovascular hypertension, (-) angina+ CAD, + Cardiac Stents, + Peripheral Vascular Disease and +CHF   Rhythm:Regular Rate:Normal  LVEF 62% Distal L main 70% Ostial Cx 60% RCA mid30% distal 20%   Neuro/Psych  Headaches, CVA, No Residual Symptoms    GI/Hepatic negative GI ROS, Neg liver ROS,   Endo/Other  Hypothyroidism Morbid obesity  Renal/GU Renal InsufficiencyRenal disease  negative genitourinary   Musculoskeletal  (+) Arthritis , Osteoarthritis,  Rupture peroneal tendon right ankle   Abdominal (+) + obese,   Peds  Hematology negative hematology ROS (+)   Anesthesia Other Findings   Reproductive/Obstetrics                           Anesthesia Physical Anesthesia Plan  ASA: III  Anesthesia Plan: General   Post-op Pain Management:    Induction: Intravenous  PONV Risk Score and Plan: 3 and Ondansetron, Dexamethasone, Midazolam, Propofol infusion and Treatment may vary due to age or medical condition  Airway Management Planned: LMA  Additional Equipment:   Intra-op Plan:   Post-operative Plan: Extubation in OR  Informed Consent: I have reviewed the patients History and Physical, chart, labs and discussed the procedure including the risks, benefits and alternatives for the proposed anesthesia with the patient or authorized representative who has indicated his/her understanding and acceptance.   Dental advisory given  Plan  Discussed with: CRNA, Anesthesiologist and Surgeon  Anesthesia Plan Comments:        Anesthesia Quick Evaluation

## 2017-07-10 NOTE — Anesthesia Postprocedure Evaluation (Signed)
Anesthesia Post Note  Patient: ALEETA SCHMALTZ  Procedure(s) Performed: RIGHT PERONEAL TENDON REPAIR (Right ) ANKLE ARTHROSCOPY (Right )     Patient location during evaluation: PACU Anesthesia Type: General Level of consciousness: awake and alert Pain management: pain level controlled Vital Signs Assessment: post-procedure vital signs reviewed and stable Respiratory status: spontaneous breathing, nonlabored ventilation, respiratory function stable and patient connected to nasal cannula oxygen Cardiovascular status: blood pressure returned to baseline and stable Postop Assessment: no apparent nausea or vomiting Anesthetic complications: no    Last Vitals:  Vitals:   07/10/17 1632 07/10/17 1647  BP: (!) 151/84 (!) 151/59  Pulse: 66 (!) 59  Resp: 12 12  Temp:    SpO2: 94% 93%    Last Pain:  Vitals:   07/10/17 1645  TempSrc:   PainSc: 0-No pain                 Ariyel Jeangilles DAVID

## 2017-07-10 NOTE — Transfer of Care (Signed)
Immediate Anesthesia Transfer of Care Note  Patient: Jill Preston  Procedure(s) Performed: RIGHT PERONEAL TENDON REPAIR (Right ) ANKLE ARTHROSCOPY (Right )  Patient Location: PACU  Anesthesia Type:General  Level of Consciousness: awake, alert  and oriented  Airway & Oxygen Therapy: Patient Spontanous Breathing and Patient connected to nasal cannula oxygen  Post-op Assessment: Report given to RN, Post -op Vital signs reviewed and stable and Patient moving all extremities X 4  Post vital signs: Reviewed and stable  Last Vitals:  Vitals:   07/10/17 1048 07/10/17 1617  BP: (!) 160/74   Pulse: 67   Resp: 18   Temp: 36.9 C (P) 36.5 C  SpO2: 96%     Last Pain:  Vitals:   07/10/17 1617  TempSrc:   PainSc: (P) Asleep         Complications: No apparent anesthesia complications

## 2017-07-10 NOTE — Interval H&P Note (Signed)
History and Physical Interval Note:  07/10/2017 12:05 PM  Jill Preston  has presented today for surgery, with the diagnosis of RIGHT ANKLE ARTHRITIS, PERONEAL TENDON TEAR, SYNOVITIS   The various methods of treatment have been discussed with the patient and family. After consideration of risks, benefits and other options for treatment, the patient has consented to  Procedure(s): RIGHT PERONEAL TENDON REPAIR (Right) ANKLE ARTHROSCOPY (Right) as a surgical intervention .  The patient's history has been reviewed, patient examined, no change in status, stable for surgery.  I have reviewed the patient's chart and labs.  Questions were answered to the patient's satisfaction.     [name]

## 2017-07-10 NOTE — Progress Notes (Signed)
Allergies reviewed with Hedy Camara from pharmacy states ol to give Cleocin.

## 2017-07-10 NOTE — Anesthesia Procedure Notes (Signed)
Procedure Name: Intubation Date/Time: 07/10/2017 1:05 PM Performed by: Mariea Clonts Pre-anesthesia Checklist: Patient identified, Emergency Drugs available, Suction available and Patient being monitored Patient Re-evaluated:Patient Re-evaluated prior to induction Oxygen Delivery Method: Circle System Utilized Preoxygenation: Pre-oxygenation with 100% oxygen Induction Type: IV induction Ventilation: Mask ventilation without difficulty Laryngoscope Size: Mac and 3 Grade View: Grade I Tube type: Oral Tube size: 7.5 mm Number of attempts: 1 Airway Equipment and Method: Stylet and Oral airway Placement Confirmation: ETT inserted through vocal cords under direct vision,  positive ETCO2 and breath sounds checked- equal and bilateral Tube secured with: Tape Dental Injury: Teeth and Oropharynx as per pre-operative assessment

## 2017-07-10 NOTE — Brief Op Note (Signed)
07/10/2017  3:57 PM  PATIENT:  Jill Preston  83 y.o. female  PRE-OPERATIVE DIAGNOSIS:  RIGHT ANKLE ARTHRITIS, PERONEAL TENDON TEAR, SYNOVITIS   POST-OPERATIVE DIAGNOSIS:  RIGHT ANKLE ARTHRITIS, PERONEAL TENDON TEAR, SYNOVITIS   PROCEDURE:  Procedure(s): RIGHT PERONEAL TENDON REPAIR (Right) ANKLE ARTHROSCOPY (Right)  SURGEON:  Surgeon(s) and Role:    * [name], DPM - Primary  PHYSICIAN ASSISTANT:   ASSISTANTS: none   ANESTHESIA:   general  EBL:  5ccs   BLOOD ADMINISTERED:none  DRAINS: none   LOCAL MEDICATIONS USED:  MARCAINE     SPECIMEN:  No Specimen  DISPOSITION OF SPECIMEN:  N/A  COUNTS:  YES  TOURNIQUET:   Total Tourniquet Time Documented: Thigh (Right) - 120 minutes Total: Thigh (Right) - 120 minutes   DICTATION: .Note written in EPIC  PLAN OF CARE: Discharge to home after PACU  PATIENT DISPOSITION:  PACU - hemodynamically stable.   Delay start of Pharmacological VTE agent (>24hrs) due to surgical blood loss or risk of bleeding: not applicable

## 2017-07-11 ENCOUNTER — Encounter (HOSPITAL_COMMUNITY): Payer: Self-pay | Admitting: Podiatry

## 2017-07-16 ENCOUNTER — Encounter: Payer: Self-pay | Admitting: Cardiovascular Disease

## 2017-07-16 ENCOUNTER — Ambulatory Visit (INDEPENDENT_AMBULATORY_CARE_PROVIDER_SITE_OTHER): Payer: PPO | Admitting: Cardiovascular Disease

## 2017-07-16 VITALS — BP 124/64 | HR 58 | Ht 65.0 in | Wt 212.0 lb

## 2017-07-16 DIAGNOSIS — I10 Essential (primary) hypertension: Secondary | ICD-10-CM

## 2017-07-16 DIAGNOSIS — I5032 Chronic diastolic (congestive) heart failure: Secondary | ICD-10-CM

## 2017-07-16 DIAGNOSIS — E785 Hyperlipidemia, unspecified: Secondary | ICD-10-CM | POA: Diagnosis not present

## 2017-07-16 DIAGNOSIS — N183 Chronic kidney disease, stage 3 unspecified: Secondary | ICD-10-CM

## 2017-07-16 DIAGNOSIS — I2510 Atherosclerotic heart disease of native coronary artery without angina pectoris: Secondary | ICD-10-CM

## 2017-07-16 NOTE — Progress Notes (Signed)
Patient ID: Jill Preston, female   DOB: 09/15/1933, 83 y.o.   MRN: [id number] Patient ID: Jill Preston, female   DOB: 11/24/1932, 83 y.o.   MRN: [id number]    Cardiology Office Note    Date:  07/16/2017   ID:  Jill Preston, DOB Sep 13, 1933, MRN [id number]  PCP:  Merrilee Seashore, MD  Cardiologist:   Sanda Klein, MD   chief complaint: chest pain   History of Present Illness:  Jill Preston is a 83 y.o. female returning in follow-up For CAD and CHF.  The patient specifically denies any chest pain at rest exertion, dyspnea at rest or with exertion, orthopnea, paroxysmal nocturnal dyspnea, syncope, palpitations, focal neurological deficits, intermittent claudication, lower extremity edema, unexplained weight gain, cough, hemoptysis or wheezing. Activity over the last week has been limited by recovery from surgical repair of a torn right ankle ligament (Dr. March Rummage).   Due to recurrent problems with chest pain she underwent coronary angiography on July 20. This showed similar anatomy to her previous catheterization. The old stent in the LAD artery was widely patent. There is a eccentric stenosis in the distal left main coronary artery that has not changed since 2016. Fractional flow reserve was only mildly altered both in the LAD (0.89) as well as in the left circumflex (0.86). The lesion was felt to not be flow-limiting and medical therapy was advised. The ventricular ejection fraction remains normal at 55-65 percent with normal wall motion. Left ventricular end-diastolic pressure was only borderline abnormal at 17 mmHg.  She has significant shortness of breath on exertion, now class II-III, worse than before catheterization. She weighs roughly 9 pounds more than she did at the time of her angiogram. She is 14 pounds over her previously estimated dry weight of 200 pounds or so. She also has ankle swelling. She does not have orthopnea or PND and denies any palpitations, syncope  or focal neurological events.  She continues to have a lot of problems with her spine.Complains of inguinal intertrigo  The patient also denies abdominal pain, nausea, vomiting, dysphagia, diarrhea, constipation, polyuria, polydipsia, dysuria, hematuria, frequency, urgency, abnormal bleeding or bruising, fever, chills, mood swings, change in skin or hair texture, change in voice quality, auditory or visual problems, allergic reactions .   She has a long history of coronary disease. She underwent proximal LAD bare-metal stenting and balloon angioplasty of the mid LAD in 2013. She has had persistent ischemia in the territory of the diagonal artery on nuclear stress test performed over the last few years. Coronary angiography performed May 2016 showed a widely patent LAD stent, 60% ostial circumflex stenosis, 20-30% right coronary artery stenosis. There was concern about possible left coronary artery stenosis but fractional flow reserve was normal (0.96 at baseline, 0.91 during intravenous adenosine infusion). She had good anginal response to Ranexa but developed severe constipation and could not tolerate the medication. She has been intolerant to beta blockers due to bradycardia. She has preserved left ventricular systolic function but has had episodes of acute exacerbation of diastolic heart failure attributed to hypertensive heart disease. December 2015, she was critically ill with an acute left iliofemoral DVT with anticoagulation complicated by retroperitoneal hematoma and hemorrhagic shock, requiring placement of an inferior vena cava filter. The filter was removed in June 2016. Coronary angiography in July 2018 showed unchanged anatomy of the eccentric stenosis in the left main coronary artery with noncritical FFR (0.89 in the LAD, 0.86 in the LCx).  Past Medical History:  Diagnosis Date  . Arthritis    right knee  . Cancer [facility]) yrs ago   skin cancer removed from face  . Chronic diastolic CHF  (congestive heart failure) ([place])   . Chronic venous insufficiency    LEA VENOUS, 10/17/2011 - mild reflux in bilateral common femoral veins  . CKD (chronic kidney disease), stage III ([place])   . Coronary artery disease    a. s/p PCI/BMS to prox LAD and balloon angioplasty to mLAD with suboptimal result in 2000. b. Abnl nuc 2012, cath 09/2011 - showed that the overall territory of potential ischemia was small and attributable to a moderately diseased small second diagonal artery. Med rx.  Marland Kitchen Dyspnea    with exertion  . Headache   . Hyperlipidemia   . Hypertension   . Hypertensive heart disease   . Hypothyroidism   . Obesity   . Stroke [facility])    pt. states she had "light stroke" in sept. 1980  . Urinary incontinence     Past Surgical History:  Procedure Laterality Date  . ABDOMINAL HYSTERECTOMY  1970's   complete  . ANKLE ARTHROSCOPY Right 07/10/2017   Procedure: ANKLE ARTHROSCOPY;  Surgeon: [name], DPM;  Location: [place];  Service: Podiatry;  Laterality: Right;  . BACK SURGERY  07/26/2016   cervical neck surgery,  surgical center  . BLADDER SURGERY  2010   WITH MESH  bladder tach  . bunion removal surgery Bilateral 15 yrs ago  . CARDIAC CATHETERIZATION Left 09/25/2011   Medical management  . CARDIAC CATHETERIZATION Left 03/25/2001   Normal LV function, LAD residual narrowing of less than 10%, normal ramus intermediate, circumflex, and RCA,   . CARDIAC CATHETERIZATION  09/04/1999   LAD, 3x75mm Tetra stent resulting in a reduction of the 80% stenosis to 0% residual  . CARDIAC CATHETERIZATION N/A 01/26/2015   Procedure: Right/Left Heart Cath and Coronary Angiography;  Surgeon: Troy Sine, MD;  Location: [place] CV LAB;  Service: Cardiovascular;  Laterality: N/A;  . CARDIAC CATHETERIZATION N/A 01/27/2015   Procedure: Intravascular Pressure Wire/FFR Study;  Surgeon: Burnell Blanks, MD;  Location: [place] CV LAB;  Service: Cardiovascular;  Laterality: N/A;    . CARDIAC CATHETERIZATION N/A 01/27/2015   Procedure: Right Heart Cath;  Surgeon: Burnell Blanks, MD;  Location: [place] CV LAB;  Service: Cardiovascular;  Laterality: N/A;  . CHOLECYSTECTOMY    . COLONOSCOPY WITH PROPOFOL N/A 03/07/2017   Procedure: COLONOSCOPY WITH PROPOFOL;  Surgeon: Juanita Craver, MD;  Location: [facility];  Service: Endoscopy;  Laterality: N/A;  . CORONARY STENT PLACEMENT     LAD x 1  . ganglion cyst removal  yrs ago   x 2  . INTRAVASCULAR PRESSURE WIRE/FFR STUDY N/A 04/05/2017   Procedure: Intravascular Pressure Wire/FFR Study;  Surgeon: Martinique, Peter M, MD;  Location: [place] CV LAB;  Service: Cardiovascular;  Laterality: N/A;  . LEFT HEART CATH AND CORONARY ANGIOGRAPHY N/A 04/05/2017   Procedure: Left Heart Cath and Coronary Angiography;  Surgeon: Martinique, Peter M, MD;  Location: [place] CV [facility];  Service: Cardiovascular;  Laterality: N/A;  . LEFT HEART CATHETERIZATION WITH CORONARY ANGIOGRAM N/A 09/25/2011   Procedure: LEFT HEART CATHETERIZATION WITH CORONARY ANGIOGRAM;  Surgeon: Sanda Klein, MD;  Location: [place] CATH LAB;  Service: Cardiovascular;  Laterality: N/A;  . multiple bladder surgeries to remove mesh    . ROTATOR CUFF REPAIR Right   . stent to groin  08/2014   left leg  . TENDON  REPAIR Right 07/10/2017   Procedure: RIGHT PERONEAL TENDON REPAIR;  Surgeon: [name], DPM;  Location: [place];  Service: Podiatry;  Laterality: Right;    Outpatient Medications Prior to Visit  Medication Sig Dispense Refill  . acetaminophen (TYLENOL) 500 MG tablet Take 500-1,000 mg by mouth every 6 (six) hours as needed (for pain.).     Marland Kitchen Apoaequorin (PREVAGEN) 10 MG CAPS Take 1 capsule by mouth daily.     Marland Kitchen aspirin EC 81 MG tablet Take 1 tablet (81 mg total) by mouth daily. 90 tablet 3  . bisacodyl (DULCOLAX) 5 MG EC tablet Take 5 mg by mouth 2 (two) times daily.    . Calcium Carbonate-Vitamin D (CALCIUM 600+D PO) Take 1 tablet by mouth daily.    .  clindamycin (CLEOCIN) 300 MG capsule Take 1 capsule (300 mg total) by mouth 2 (two) times daily. 14 capsule 0  . diphenhydrAMINE (BENADRYL) 25 MG tablet Take 25 mg by mouth every 6 (six) hours as needed for itching.     . fenofibrate 160 MG tablet Take 160 mg by mouth daily.   0  . furosemide (LASIX) 20 MG tablet Take 10 mg by mouth 2 (two) times daily.     . Homeopathic Products (LEG CRAMP RELIEF) SUBL Place 2 tablets under the tongue at bedtime as needed (cramps).    . isosorbide mononitrate (IMDUR) 30 MG 24 hr tablet TAKE 2 TABLETS BY MOUTH 2 TIMES DAILY 120 tablet 11  . levothyroxine (SYNTHROID, LEVOTHROID) 137 MCG tablet Take 137 mcg by mouth daily before breakfast.    . losartan-hydrochlorothiazide (HYZAAR) 100-25 MG tablet Take 0.5 tablets by mouth daily.  0  . Menthol-Methyl Salicylate (MUSCLE RUB) 10-15 % CREA Apply 1 application topically 2 (two) times daily as needed for muscle pain (knee pain).     . Miconazole Nitrate 2 % POWD Apply 1 application topically 2 (two) times daily. (Patient taking differently: Apply 1 application topically 2 (two) times daily as needed (for rash around lower stomach/groin/legs). ) 5 g 0  . Multiple Vitamin (MULTIVITAMIN WITH MINERALS) TABS tablet Take 1 tablet by mouth 2 (two) times daily. Centrum 50+     . nitroGLYCERIN (NITROSTAT) 0.4 MG SL tablet Place 1 tablet (0.4 mg total) under the tongue every 5 (five) minutes as needed for chest pain. 25 tablet 2  . nystatin (MYCOSTATIN/NYSTOP) powder Apply 1 g topically daily as needed (rash).     . ondansetron (ZOFRAN) 4 MG tablet Take 1 tablet (4 mg total) by mouth daily as needed for nausea or vomiting. 30 tablet 1  . oxyCODONE-acetaminophen (PERCOCET) 10-325 MG tablet Take 1 tablet by mouth every 4 (four) hours as needed for pain. 30 tablet 0  . potassium chloride (K-DUR) 10 MEQ tablet Take 10 mEq by mouth 2 (two) times daily.    . pravastatin (PRAVACHOL) 80 MG tablet Take 80 mg by mouth at bedtime.    .  vitamin C (ASCORBIC ACID) 500 MG tablet Take 1,000-3,000 mg by mouth daily.      No facility-administered medications prior to visit.      Allergies:   Atorvastatin; Penicillins; Aminophylline; Contrast media [iodinated diagnostic agents]; Latex; Adhesive [tape]; Betadine [povidone iodine]; Ciprofloxacin; Codeine; Gelnique [oxybutynin]; Ranexa [ranolazine]; and Shellfish allergy   Social History   Social History  . Marital status: Married    Spouse name: N/A  . Number of children: N/A  . Years of education: N/A   Social History Main Topics  . Smoking  status: Never Smoker  . Smokeless tobacco: Never Used  . Alcohol use No  . Drug use: No  . Sexual activity: Not Asked   Other Topics Concern  . None   Social History Narrative  . None     Family History:  The patient's family history includes Cancer in her mother; Heart disease in her brother, father, and sister.   ROS:   Please see the history of present illness.    ROS All other systems reviewed and are negative.   PHYSICAL EXAM:   VS:  BP 124/64   Pulse (!) 58   Ht 5\' 5"  (1.651 m)   Wt 212 lb (96.2 kg)   SpO2 98%   BMI 35.28 kg/m     General: Alert, oriented x3, no distress.. Mildly obese Head: no evidence of trauma, PERRL, EOMI, no exophtalmos or lid lag, no myxedema, no xanthelasma; normal ears, nose and oropharynx Neck: normal jugular venous pulsations and no hepatojugular reflux; brisk carotid pulses without delay and no carotid bruits Chest: clear to auscultation, no signs of consolidation by percussion or palpation, normal fremitus, symmetrical and full respiratory excursions Cardiovascular: normal position and quality of the apical impulse, regular rhythm, normal first and second heart sounds, no murmurs, rubs or gallops Abdomen: no tenderness or distention, no masses by palpation, no abnormal pulsatility or arterial bruits, normal bowel sounds, no hepatosplenomegaly, Inguinal intertrigo Extremities: no  clubbing, cyanosis or edema; 2+ radial, ulnar and brachial pulses bilaterally; 2+ right femoral, posterior tibial and dorsalis pedis pulses; 2+ left femoral, posterior tibial and dorsalis pedis pulses; no subclavian or femoral bruits Neurological: grossly nonfocal   Wt Readings from Last 3 Encounters:  07/16/17 212 lb (96.2 kg)  07/10/17 214 lb (97.1 kg)  07/09/17 214 lb 3.2 oz (97.2 kg)      Studies/Labs Reviewed:   EKG:  EKG is not ordered today.  Recent Labs: 11/15/2016: Magnesium 2.0 07/09/2017: BUN 18; Creatinine, Ser 1.10; Hemoglobin 14.7; Platelets 189; Potassium 3.9; Sodium 139   Lipid Panel    Component Value Date/Time   CHOL 187 06/27/2015 1336   TRIG 239 (H) 06/27/2015 1336   HDL 45 (L) 06/27/2015 1336   CHOLHDL 4.2 06/27/2015 1336   VLDL 48 (H) 06/27/2015 1336   LDLCALC 94 06/27/2015 1336   Labs 09/04/2016 total cholesterol 169, triglycerides 160, HDL 40, LDL 97, hemoglobin A1c 6.1%, glucose 104, creatinine 1.16, BUN 19, TSH 0.40  CATH 04/05/2017   LM lesion, 70 %stenosed.  Mid LAD lesion, 0 %stenosed at site of prior stent.  Ost Cx lesion, 60 %stenosed.  Prox RCA-1 lesion, 20 %stenosed.  Prox RCA-2 lesion, 30 %stenosed.  The left ventricular systolic function is normal.  LV end diastolic pressure is normal.  The left ventricular ejection fraction is 55-65% by visual estimate.   1. 70% eccentric distal left main stenosis. Angiographically similar to 2016. FFR 0.89 into the LAD and 0.86 into the LCx suggesting that this lesion is not flow limiting.  2. Otherwise nonobstructive CAD. Patent stent in LAD. 3. Normal LV function 4. Normal LVEDP  Plan: recommend continued medical therapy.   ASSESSMENT:    1. Chronic diastolic CHF (congestive heart failure) ([place])   2. CAD in native artery   3. Essential hypertension   4. Dyslipidemia   5. CKD (chronic kidney disease), stage III ([place])      PLAN:  In order of problems listed above:  1. CHF: She  has a narrow margin of compensation between heart  failure and kidney failure.  By exam today she appears to be euvolemic.  Her weight is identical to her last appointment (when she appeared to be fluid loaded), but she is wearing a heavy orthopedic boot. 2. CAD: Shortness of breath have not been recent complaints.  This may be due to the fact that she has been sedentary following her ankle surgery.  Continue same antianginal medications.  She is known to have a moderate stenosis in the left main coronary artery that was not significant by pressure wire analysis. 3. HTN:  well controlled. She should always use a blood pressure monitor with a large arm cuff in her right upper extremity. Appears to have left subclavian artery stenosis 4. Hyperlipidemia: fair lipid profile, but target LDL<70. Intolerant to most statins, including atorvastatin and rosuvastatin. On maximum dose pravastatin at this time. Reminded that she has to take at bedtime for maximum efficiency.. Discussed PCS K9 inhibitors, seems to be financially prohibitive. 5. CKD: Very recent labs show improvement in creatinine level.   Medication Adjustments/Labs and Tests Ordered: Current medicines are reviewed at length with the patient today.  Concerns regarding medicines are outlined above.  Medication changes, Labs and Tests ordered today are listed in the Patient Instructions below. Patient Instructions  Dr Sallyanne Kuster recommends that you schedule a follow-up appointment in 4 months.  If you need a refill on your cardiac medications before your next appointment, please call your pharmacy.      Signed, Sanda Klein, MD  07/16/2017 2:40 PM    [facility] [address], [place], [place]  [address] Phone: [phone]; Fax: [phone]

## 2017-07-16 NOTE — Patient Instructions (Signed)
Dr Croitoru recommends that you schedule a follow-up appointment in 4 months.  If you need a refill on your cardiac medications before your next appointment, please call your pharmacy. 

## 2017-07-17 ENCOUNTER — Ambulatory Visit (INDEPENDENT_AMBULATORY_CARE_PROVIDER_SITE_OTHER): Payer: Self-pay | Admitting: Podiatry

## 2017-07-17 VITALS — BP 123/68 | HR 63

## 2017-07-17 DIAGNOSIS — M76821 Posterior tibial tendinitis, right leg: Secondary | ICD-10-CM

## 2017-07-17 DIAGNOSIS — Z9889 Other specified postprocedural states: Secondary | ICD-10-CM

## 2017-07-17 NOTE — Progress Notes (Addendum)
  Subjective:  Patient ID: Jill Preston, female    DOB: 1933-05-06,  MRN: [id number]  Chief Complaint  Patient presents with  . Routine Post Op    pov #1 - patient doing well, no pain medication since saturday   83 y.o. female returns for post-op of the above procedure.  She has not had to take pain medication since Saturday (3 days post procedure).  Denies nausea vomiting fever chills. Denies pain today.  Objective:   Vitals:   07/17/17 1324  BP: 123/68  Pulse: 63   General AA&O x3. Normal mood and affect.  Vascular Foot warm and well perfused.  Neurologic Gross sensation intact.  Dermatologic Skin doing well with intact suture.  No erythema no drainage no warmth no signs of acute infection.  Orthopedic: Tenderness to palpation noted about the surgical site. No tenderness to palpation at the right posterior tibial tendon.   Assessment & Plan:  Patient was evaluated and treated and all questions answered.  S/p right peroneal tendon repair, right ankle arthroscopy -Skin healing well.  Sutures left intact.  No signs of infection -Return 1 week for suture removal. -Discussed postop course including 4 weeks total nonweightbearing.  Will transition to PT at 4 weeks.  Right PT tendinitis -Doing well post injection.  No pain to palpation.  Return in about 1 week (around 07/24/2017) for Post-op.

## 2017-07-18 NOTE — Addendum Note (Signed)
Addended by: Hardie Pulley on: 07/18/2017 08:54 AM   Modules accepted: Level of Service

## 2017-07-24 ENCOUNTER — Ambulatory Visit (INDEPENDENT_AMBULATORY_CARE_PROVIDER_SITE_OTHER): Payer: PPO | Admitting: Podiatry

## 2017-07-24 DIAGNOSIS — M659 Synovitis and tenosynovitis, unspecified: Secondary | ICD-10-CM

## 2017-07-25 ENCOUNTER — Telehealth: Payer: Self-pay | Admitting: *Deleted

## 2017-07-25 NOTE — Telephone Encounter (Signed)
Faxed required form, LOV and demographics to [facility]. 

## 2017-07-25 NOTE — Progress Notes (Signed)
  Subjective:  Patient ID: Jill Preston, female    DOB: [birth year],  MRN: [id number]  Chief Complaint  Patient presents with  . Routine Post Op    POV #2/ DOS 07/10/17/ suture removal    DOS: 07/10/17 Procedure: R Peroneal Tendon Repair, R Ankle Arthroscopy  83 y.o. female returns for post-op check. Denies N/V/F/Ch. Pain controlled. Not currently taking medications.  Objective:   General AA&O x3. Normal mood and affect.  Vascular Foot warm and well perfused.  Neurologic Gross sensation intact.  Dermatologic Skin healing well without signs of infection. Skin edges well coapted without signs of infection.  Orthopedic: Tenderness to palpation noted about the surgical site.    Assessment & Plan:  Patient was evaluated and treated and all questions answered.  S/p R Peroneal Tendon Repair, Ankle Arthroscopy -Progressing as expected post-operatively. -Sutures: removed. Steris applied. Ok to shower but not soak. -Medications refilled: none -Foot redressed. -Continue NWB to RLE -Start PROM exercises with home PT. Will order [facility].  Return in about 2 weeks (around 08/07/2017) for Post-op.

## 2017-07-26 NOTE — Telephone Encounter (Signed)
Jill Preston - [facility] states pt's insurance is not in network, her home health care will be taken by [facility] and begin 07/29/2017.

## 2017-07-30 DIAGNOSIS — I10 Essential (primary) hypertension: Secondary | ICD-10-CM | POA: Diagnosis not present

## 2017-07-30 DIAGNOSIS — I509 Heart failure, unspecified: Secondary | ICD-10-CM | POA: Diagnosis not present

## 2017-07-30 DIAGNOSIS — M6281 Muscle weakness (generalized): Secondary | ICD-10-CM | POA: Diagnosis not present

## 2017-07-30 DIAGNOSIS — E669 Obesity, unspecified: Secondary | ICD-10-CM | POA: Diagnosis not present

## 2017-07-31 ENCOUNTER — Telehealth: Payer: Self-pay | Admitting: *Deleted

## 2017-07-31 ENCOUNTER — Telehealth: Payer: Self-pay | Admitting: Podiatry

## 2017-07-31 NOTE — Telephone Encounter (Signed)
Left message informing Physical Therapist - [facility], of Dr. March Rummage 07/31/2017 1:40pm orders.

## 2017-07-31 NOTE — Telephone Encounter (Signed)
Yes, my name is Lemmie Evens, a physical therapist with [facility]. I'm calling in regards to the order in regards to pass of range of motion, and I'm clarifying that is for her right ankle. I'm following precautions from her surgery as well. I am wanting to know know if she can do active range of motion of her right hip and knee, I will be careful. Also, does Dr. March Rummage want no pass of motion of her toes? If you could please call me back for clarification in regards to the right ankle. You can reach me at [phone] and if I don't answer you can leave me a voicemail. I will be going back out to see her tomorrow. Thanks so much.

## 2017-07-31 NOTE — Telephone Encounter (Addendum)
I informed Marla - [facility] Physical Therapist, Dr. March Rummage wanted pt to have 2 weeks of Active ROM then full weight bearing in cam boot as tolerated.

## 2017-07-31 NOTE — Telephone Encounter (Signed)
Ok for Active ROM of Hip and knee. Ok for pass ROM of the toes. Please inform. Thanks!

## 2017-07-31 NOTE — Telephone Encounter (Signed)
Physical Therapist - [facility] request PT orders for right ankle. Dr. March Rummage orders passive ROM right ankle for 2 weeks then active ROM for the remaining scheduled PT.

## 2017-08-03 DIAGNOSIS — I10 Essential (primary) hypertension: Secondary | ICD-10-CM | POA: Diagnosis not present

## 2017-08-03 DIAGNOSIS — I509 Heart failure, unspecified: Secondary | ICD-10-CM | POA: Diagnosis not present

## 2017-08-03 DIAGNOSIS — E669 Obesity, unspecified: Secondary | ICD-10-CM | POA: Diagnosis not present

## 2017-08-03 DIAGNOSIS — M6281 Muscle weakness (generalized): Secondary | ICD-10-CM | POA: Diagnosis not present

## 2017-08-05 DIAGNOSIS — M79622 Pain in left upper arm: Secondary | ICD-10-CM | POA: Diagnosis not present

## 2017-08-06 DIAGNOSIS — M79622 Pain in left upper arm: Secondary | ICD-10-CM | POA: Diagnosis not present

## 2017-08-07 ENCOUNTER — Encounter: Payer: PPO | Admitting: Podiatry

## 2017-08-07 ENCOUNTER — Ambulatory Visit (INDEPENDENT_AMBULATORY_CARE_PROVIDER_SITE_OTHER): Payer: PPO | Admitting: Podiatry

## 2017-08-07 DIAGNOSIS — Z9889 Other specified postprocedural states: Secondary | ICD-10-CM

## 2017-08-07 DIAGNOSIS — M659 Synovitis and tenosynovitis, unspecified: Secondary | ICD-10-CM

## 2017-08-07 NOTE — Progress Notes (Signed)
  Subjective:  Patient ID: Jill Preston, female    DOB: 02-01-1933,  MRN: [id number]  Chief Complaint  Patient presents with  . Routine Post Op    dooing well  - range of motion slowly getting better  -swelling down bilaterally    DOS: 07/10/17 Procedure: R Peroneal Tendon Repair, R Ankle Arthroscopy  83 y.o. female returns for post-op check. Denies N/V/F/Ch. Has been working on strengthening and PROM exercises with home PT.  Objective:   General AA&O x3. Normal mood and affect.  Vascular Foot warm and well perfused.  Neurologic Gross sensation intact.  Dermatologic Skin well healed.  Orthopedic: Minimal tenderness to palpation noted about the surgical site.    Assessment & Plan:  Patient was evaluated and treated and all questions answered.  S/p R Peroneal Repair, R Ankle arthroscopy. -Progressing as expected post-operatively. -Skin well healed. -Will start AROM for 2 weeks with PT.  -Begin protected, assisted WB in 2 weeks with walker assist.  Return in about 3 weeks (around 08/28/2017) for Post-op.

## 2017-08-07 NOTE — Patient Instructions (Signed)
PT Instructions:  Work on AROM for 2 weeks Begin protected, assisted WB in 2 weeks with walker assist.  Will f/u with patient in 3 weeks

## 2017-08-10 DIAGNOSIS — M6281 Muscle weakness (generalized): Secondary | ICD-10-CM | POA: Diagnosis not present

## 2017-08-10 DIAGNOSIS — I509 Heart failure, unspecified: Secondary | ICD-10-CM | POA: Diagnosis not present

## 2017-08-10 DIAGNOSIS — E669 Obesity, unspecified: Secondary | ICD-10-CM | POA: Diagnosis not present

## 2017-08-10 DIAGNOSIS — I10 Essential (primary) hypertension: Secondary | ICD-10-CM | POA: Diagnosis not present

## 2017-08-17 DIAGNOSIS — M6281 Muscle weakness (generalized): Secondary | ICD-10-CM | POA: Diagnosis not present

## 2017-08-17 DIAGNOSIS — I10 Essential (primary) hypertension: Secondary | ICD-10-CM | POA: Diagnosis not present

## 2017-08-17 DIAGNOSIS — E669 Obesity, unspecified: Secondary | ICD-10-CM | POA: Diagnosis not present

## 2017-08-17 DIAGNOSIS — I509 Heart failure, unspecified: Secondary | ICD-10-CM | POA: Diagnosis not present

## 2017-08-24 DIAGNOSIS — I10 Essential (primary) hypertension: Secondary | ICD-10-CM | POA: Diagnosis not present

## 2017-08-24 DIAGNOSIS — I509 Heart failure, unspecified: Secondary | ICD-10-CM | POA: Diagnosis not present

## 2017-08-24 DIAGNOSIS — M6281 Muscle weakness (generalized): Secondary | ICD-10-CM | POA: Diagnosis not present

## 2017-08-24 DIAGNOSIS — E669 Obesity, unspecified: Secondary | ICD-10-CM | POA: Diagnosis not present

## 2017-08-29 ENCOUNTER — Ambulatory Visit (INDEPENDENT_AMBULATORY_CARE_PROVIDER_SITE_OTHER): Payer: PPO | Admitting: Podiatry

## 2017-08-29 ENCOUNTER — Telehealth: Payer: Self-pay | Admitting: *Deleted

## 2017-08-29 DIAGNOSIS — M659 Synovitis and tenosynovitis, unspecified: Secondary | ICD-10-CM

## 2017-08-29 DIAGNOSIS — M76821 Posterior tibial tendinitis, right leg: Secondary | ICD-10-CM

## 2017-08-29 DIAGNOSIS — Z9889 Other specified postprocedural states: Secondary | ICD-10-CM

## 2017-08-29 NOTE — Progress Notes (Signed)
  Subjective:  Patient ID: Jill Preston, female    DOB: [birth year],  MRN: [id number]  DOS: 07/10/17  Procedure: R Ankle Peroneal Repair, R ankle arthroscopy, R PT Tendon injection   83 y.o. female returns for post-op check. Denies N/V/F/Ch. Doing well. Making progress with PT.  Objective:   General AA&O x3. Normal mood and affect.  Vascular Foot warm and well perfused.  Neurologic Gross sensation intact.  Dermatologic Skin well healed. Not  Orthopedic: No tenderness to palpation noted about the surgical site. No pain with active eversion. Good eversion strength noted.   Assessment & Plan:  Patient was evaluated and treated and all questions answered.  S/p R Ankle Peroneal Repair, R ankle arthroscopy, R PT Tendon injection -Progressing as expected post-operatively. -Wound well healed. -Medications refilled: none -Foot redressed with ACE bandage  -Start protected weightbearing in boot -Progress with physical therapy for additional 2 weeks.  Can transition out of cam boot in 2 weeks.  Return in about 4 weeks (around 09/26/2017) for All.

## 2017-08-29 NOTE — Telephone Encounter (Signed)
-----   Message from [name], DPM sent at 08/29/2017  9:00 AM EST ----- Regarding: PT orders Can we send updated PT orders for patient?  WBAT in CAM boot RLE. Transition out of boot to the RLE

## 2017-08-29 NOTE — Telephone Encounter (Signed)
Left message requesting call back for new orders for PT. Jill Preston, PT - [facility] was given orders and she requested more information concerning when pt could transition out of the boot, if pt could transition to cane from walker. Jill Preston states pt is doing well, and Dr. March Rummage did a fantastic job on her surgery.

## 2017-08-30 NOTE — Telephone Encounter (Signed)
Left message informing [facility], Dr. March Rummage ordered in 1 week pt may transition to cane, 2 weeks may begin transitioning to regular shoe at Physical Therapist discretion.

## 2017-08-31 DIAGNOSIS — I10 Essential (primary) hypertension: Secondary | ICD-10-CM | POA: Diagnosis not present

## 2017-08-31 DIAGNOSIS — M6281 Muscle weakness (generalized): Secondary | ICD-10-CM | POA: Diagnosis not present

## 2017-08-31 DIAGNOSIS — I509 Heart failure, unspecified: Secondary | ICD-10-CM | POA: Diagnosis not present

## 2017-08-31 DIAGNOSIS — E669 Obesity, unspecified: Secondary | ICD-10-CM | POA: Diagnosis not present

## 2017-09-04 DIAGNOSIS — L570 Actinic keratosis: Secondary | ICD-10-CM | POA: Diagnosis not present

## 2017-09-07 DIAGNOSIS — I509 Heart failure, unspecified: Secondary | ICD-10-CM | POA: Diagnosis not present

## 2017-09-07 DIAGNOSIS — E669 Obesity, unspecified: Secondary | ICD-10-CM | POA: Diagnosis not present

## 2017-09-07 DIAGNOSIS — M6281 Muscle weakness (generalized): Secondary | ICD-10-CM | POA: Diagnosis not present

## 2017-09-07 DIAGNOSIS — I10 Essential (primary) hypertension: Secondary | ICD-10-CM | POA: Diagnosis not present

## 2017-09-14 DIAGNOSIS — E669 Obesity, unspecified: Secondary | ICD-10-CM | POA: Diagnosis not present

## 2017-09-14 DIAGNOSIS — I509 Heart failure, unspecified: Secondary | ICD-10-CM | POA: Diagnosis not present

## 2017-09-14 DIAGNOSIS — I10 Essential (primary) hypertension: Secondary | ICD-10-CM | POA: Diagnosis not present

## 2017-09-14 DIAGNOSIS — M6281 Muscle weakness (generalized): Secondary | ICD-10-CM | POA: Diagnosis not present

## 2017-09-17 DIAGNOSIS — I10 Essential (primary) hypertension: Secondary | ICD-10-CM | POA: Diagnosis not present

## 2017-09-17 DIAGNOSIS — M6281 Muscle weakness (generalized): Secondary | ICD-10-CM | POA: Diagnosis not present

## 2017-09-17 DIAGNOSIS — E669 Obesity, unspecified: Secondary | ICD-10-CM | POA: Diagnosis not present

## 2017-09-17 DIAGNOSIS — I509 Heart failure, unspecified: Secondary | ICD-10-CM | POA: Diagnosis not present

## 2017-09-21 DIAGNOSIS — I10 Essential (primary) hypertension: Secondary | ICD-10-CM | POA: Diagnosis not present

## 2017-09-21 DIAGNOSIS — M6281 Muscle weakness (generalized): Secondary | ICD-10-CM | POA: Diagnosis not present

## 2017-09-21 DIAGNOSIS — E669 Obesity, unspecified: Secondary | ICD-10-CM | POA: Diagnosis not present

## 2017-09-21 DIAGNOSIS — I509 Heart failure, unspecified: Secondary | ICD-10-CM | POA: Diagnosis not present

## 2017-09-26 ENCOUNTER — Telehealth: Payer: Self-pay | Admitting: *Deleted

## 2017-09-26 ENCOUNTER — Ambulatory Visit (INDEPENDENT_AMBULATORY_CARE_PROVIDER_SITE_OTHER): Payer: PPO | Admitting: Podiatry

## 2017-09-26 DIAGNOSIS — Z9889 Other specified postprocedural states: Secondary | ICD-10-CM

## 2017-09-26 NOTE — Telephone Encounter (Signed)
Left message Jill Preston - [facility] PT to continue PT to maximal improvement, okay to weight bear without restrictions and to transition out of the assisting devices.

## 2017-09-28 DIAGNOSIS — E669 Obesity, unspecified: Secondary | ICD-10-CM | POA: Diagnosis not present

## 2017-09-28 DIAGNOSIS — M6281 Muscle weakness (generalized): Secondary | ICD-10-CM | POA: Diagnosis not present

## 2017-09-28 DIAGNOSIS — I10 Essential (primary) hypertension: Secondary | ICD-10-CM | POA: Diagnosis not present

## 2017-09-28 DIAGNOSIS — I509 Heart failure, unspecified: Secondary | ICD-10-CM | POA: Diagnosis not present

## 2017-09-30 ENCOUNTER — Telehealth: Payer: Self-pay | Admitting: *Deleted

## 2017-09-30 DIAGNOSIS — M25512 Pain in left shoulder: Secondary | ICD-10-CM | POA: Diagnosis not present

## 2017-09-30 NOTE — Telephone Encounter (Signed)
Jill Preston, PT states she has received the PT orders of 09/26/2017 and will contact pt.

## 2017-10-04 DIAGNOSIS — M25561 Pain in right knee: Secondary | ICD-10-CM | POA: Diagnosis not present

## 2017-10-04 DIAGNOSIS — M7580 Other shoulder lesions, unspecified shoulder: Secondary | ICD-10-CM | POA: Diagnosis not present

## 2017-10-04 DIAGNOSIS — M7522 Bicipital tendinitis, left shoulder: Secondary | ICD-10-CM | POA: Diagnosis not present

## 2017-10-04 DIAGNOSIS — M25512 Pain in left shoulder: Secondary | ICD-10-CM | POA: Diagnosis not present

## 2017-10-04 DIAGNOSIS — M1711 Unilateral primary osteoarthritis, right knee: Secondary | ICD-10-CM | POA: Diagnosis not present

## 2017-10-05 DIAGNOSIS — I10 Essential (primary) hypertension: Secondary | ICD-10-CM | POA: Diagnosis not present

## 2017-10-05 DIAGNOSIS — M6281 Muscle weakness (generalized): Secondary | ICD-10-CM | POA: Diagnosis not present

## 2017-10-05 DIAGNOSIS — I509 Heart failure, unspecified: Secondary | ICD-10-CM | POA: Diagnosis not present

## 2017-10-05 DIAGNOSIS — E669 Obesity, unspecified: Secondary | ICD-10-CM | POA: Diagnosis not present

## 2017-10-09 ENCOUNTER — Telehealth: Payer: Self-pay | Admitting: Podiatry

## 2017-10-09 NOTE — Telephone Encounter (Signed)
Hello, this is Horris Latino, I'm the physical therapist who treated Jill Preston. I have discharged today and she is in agreement as she is doing wonderful. Dr. March Rummage did an absolutely great surgery and all of you have been so good at keeping in touch with me. I cannot think you enough for all you've done also to make my job so much easier. My cell phone number is [phone] if you have any questions. Thank you so much for everything and have a good day.

## 2017-10-12 ENCOUNTER — Other Ambulatory Visit: Payer: Self-pay | Admitting: Rheumatology

## 2017-10-12 DIAGNOSIS — M25512 Pain in left shoulder: Secondary | ICD-10-CM

## 2017-10-17 NOTE — Progress Notes (Signed)
  Subjective:  Patient ID: Jill Preston, female    DOB: 03-21-1933,  MRN: [id number]  Chief Complaint  Patient presents with  . Routine Post Op    4 wk POV dos 10.24.18 Repair Peroneal Tendon Rt; Ankle Arthroscopy Rt    DOS: 07/10/2018 Procedure: Right ankle arthroscopy, right ankle peroneal tendon repair  84 y.o. female returns for post-op check. Denies N/V/F/Ch. Is doing physical therapy with noted results.  Patient states she is doing very well and pleased with results of surgery.  Objective:   General AA&O x3. Normal mood and affect.  Vascular Foot warm and well perfused.  Neurologic Gross sensation intact.  Dermatologic Skin healing well without signs of infection. Skin edges well coapted without signs of infection.  Orthopedic: Tenderness to palpation noted about the surgical site.    Assessment & Plan:  Patient was evaluated and treated and all questions answered.  S/p right peroneal tendon repair, right ankle arthroscopy -Progressing as expected post-operatively. -Continue PT to maximal medical improvement.  Okay to weight-bear without restrictions.  PT to transition out of all assistive devices.  Return in about 6 weeks (around 11/07/2017) for Post-op.

## 2017-10-18 NOTE — Progress Notes (Signed)
DOS 07/10/17 Rt peroneal tendon repair, right ankle arthroscopy w/ debridement

## 2017-10-20 ENCOUNTER — Ambulatory Visit
Admission: RE | Admit: 2017-10-20 | Discharge: 2017-10-20 | Disposition: A | Discharge status: Home / Self Care | Payer: PPO | Source: Ambulatory Visit | Attending: Rheumatology | Admitting: Rheumatology

## 2017-10-20 DIAGNOSIS — M75112 Incomplete rotator cuff tear or rupture of left shoulder, not specified as traumatic: Secondary | ICD-10-CM | POA: Diagnosis not present

## 2017-10-20 DIAGNOSIS — M25512 Pain in left shoulder: Secondary | ICD-10-CM

## 2017-10-21 DIAGNOSIS — E039 Hypothyroidism, unspecified: Secondary | ICD-10-CM | POA: Diagnosis not present

## 2017-10-21 DIAGNOSIS — E782 Mixed hyperlipidemia: Secondary | ICD-10-CM | POA: Diagnosis not present

## 2017-10-21 DIAGNOSIS — I2510 Atherosclerotic heart disease of native coronary artery without angina pectoris: Secondary | ICD-10-CM | POA: Diagnosis not present

## 2017-10-21 DIAGNOSIS — Z0000 Encounter for general adult medical examination without abnormal findings: Secondary | ICD-10-CM | POA: Diagnosis not present

## 2017-10-28 DIAGNOSIS — E782 Mixed hyperlipidemia: Secondary | ICD-10-CM | POA: Diagnosis not present

## 2017-10-28 DIAGNOSIS — E876 Hypokalemia: Secondary | ICD-10-CM | POA: Diagnosis not present

## 2017-10-28 DIAGNOSIS — I2510 Atherosclerotic heart disease of native coronary artery without angina pectoris: Secondary | ICD-10-CM | POA: Diagnosis not present

## 2017-10-28 DIAGNOSIS — E039 Hypothyroidism, unspecified: Secondary | ICD-10-CM | POA: Diagnosis not present

## 2017-10-28 DIAGNOSIS — I5032 Chronic diastolic (congestive) heart failure: Secondary | ICD-10-CM | POA: Diagnosis not present

## 2017-10-28 DIAGNOSIS — I10 Essential (primary) hypertension: Secondary | ICD-10-CM | POA: Diagnosis not present

## 2017-10-28 DIAGNOSIS — M150 Primary generalized (osteo)arthritis: Secondary | ICD-10-CM | POA: Diagnosis not present

## 2017-10-28 DIAGNOSIS — M5440 Lumbago with sciatica, unspecified side: Secondary | ICD-10-CM | POA: Diagnosis not present

## 2017-10-28 DIAGNOSIS — N183 Chronic kidney disease, stage 3 (moderate): Secondary | ICD-10-CM | POA: Diagnosis not present

## 2017-10-29 DIAGNOSIS — M75112 Incomplete rotator cuff tear or rupture of left shoulder, not specified as traumatic: Secondary | ICD-10-CM | POA: Diagnosis not present

## 2017-10-29 DIAGNOSIS — M75102 Unspecified rotator cuff tear or rupture of left shoulder, not specified as traumatic: Secondary | ICD-10-CM | POA: Insufficient documentation

## 2017-11-07 ENCOUNTER — Ambulatory Visit (INDEPENDENT_AMBULATORY_CARE_PROVIDER_SITE_OTHER): Payer: PPO | Admitting: Podiatry

## 2017-11-07 DIAGNOSIS — M659 Synovitis and tenosynovitis, unspecified: Secondary | ICD-10-CM

## 2017-11-07 DIAGNOSIS — S86311S Strain of muscle(s) and tendon(s) of peroneal muscle group at lower leg level, right leg, sequela: Secondary | ICD-10-CM

## 2017-11-07 DIAGNOSIS — M76821 Posterior tibial tendinitis, right leg: Secondary | ICD-10-CM

## 2017-11-08 NOTE — Progress Notes (Signed)
  Subjective:  Patient ID: Jill Preston, female    DOB: 1933/09/03,  MRN: [id number]  Chief Complaint  Patient presents with  . Routine Post Op    dos 10.24.18 Repair Peroneal Tendon Rt; Ankle Arthroscopy Rt - doing very well, still has some swelling    DOS: 07/10/2018 Procedure: Right ankle arthroscopy, right ankle peroneal tendon repair  84 y.o. female returns for post-op check. Denies pain. Only complains of intermittent swelling to the R ankle.  Objective:   General AA&O x3. Normal mood and affect.  Vascular Foot warm and well perfused.  Neurologic Gross sensation intact.  Dermatologic Skin well healed with thin scar.  Orthopedic: No tenderness to palpation noted about the peroneal tendons.    Assessment & Plan:  Patient was evaluated and treated and all questions answered.  S/p right peroneal tendon repair, right ankle arthroscopy -Doing well, no pain. -Dicussed swelling may take up to a year to fully resolve. -Discussed return precautions.  Return if symptoms worsen or fail to improve.

## 2017-11-14 ENCOUNTER — Encounter: Payer: Self-pay | Admitting: Cardiovascular Disease

## 2017-11-14 ENCOUNTER — Ambulatory Visit: Payer: PPO | Admitting: Cardiovascular Disease

## 2017-11-14 VITALS — BP 130/74 | HR 72 | Ht 65.5 in | Wt 222.6 lb

## 2017-11-14 DIAGNOSIS — I5032 Chronic diastolic (congestive) heart failure: Secondary | ICD-10-CM | POA: Diagnosis not present

## 2017-11-14 DIAGNOSIS — I739 Peripheral vascular disease, unspecified: Secondary | ICD-10-CM | POA: Diagnosis not present

## 2017-11-14 DIAGNOSIS — N183 Chronic kidney disease, stage 3 unspecified: Secondary | ICD-10-CM

## 2017-11-14 DIAGNOSIS — E785 Hyperlipidemia, unspecified: Secondary | ICD-10-CM

## 2017-11-14 DIAGNOSIS — I10 Essential (primary) hypertension: Secondary | ICD-10-CM

## 2017-11-14 DIAGNOSIS — Z79899 Other long term (current) drug therapy: Secondary | ICD-10-CM | POA: Diagnosis not present

## 2017-11-14 DIAGNOSIS — I2510 Atherosclerotic heart disease of native coronary artery without angina pectoris: Secondary | ICD-10-CM | POA: Diagnosis not present

## 2017-11-14 MED ORDER — POTASSIUM CHLORIDE ER 10 MEQ PO TBCR: 20.0000 meq | EXTENDED_RELEASE_TABLET | Freq: Two times a day (BID) | ORAL | 3 refills | Status: DC

## 2017-11-14 MED ORDER — FUROSEMIDE 40 MG PO TABS: ORAL_TABLET | ORAL | 3 refills | Status: DC

## 2017-11-14 NOTE — Progress Notes (Signed)
Patient ID: Jill Preston, female   DOB: 08-14-1933, 84 y.o.   MRN: [id number] Patient ID: Jill Preston, female   DOB: 04-06-1933, 84 y.o.   MRN: [id number]    Cardiology Office Note    Date:  11/15/2017   ID:  Jill Preston, DOB [birth year], MRN [id number]  PCP:  Merrilee Seashore, MD  Cardiologist:   Sanda Klein, MD   chief complaint: Fatigue and ankle swelling   History of Present Illness:  Jill Preston is a 84 y.o. female returning in follow-up For CAD and CHF.  She has not had any chest pain since her last appointment, but reports that she has reduced exercise tolerance.  She develops fatigue with relatively light activity.  She also becomes short of breath, which is getting tired seems to be the dominant complaint.  She has developed ankle swelling, 2+ on the right and 1+ on the left.  Her weight today is up 8lb from her last appointment (probably more than that since she was wearing an orthopedic boot last time) and is substantially higher than her estimated "dry weight" of 200 pounds.  Her activity level has been limited over the last several months due to her torn right ankle ligament.  She is trying to find some type of exercise though not put strain on her ankle joint.  She does not have orthopnea or PND.  She has not been troubled by palpitations, dizziness, syncope.  She does complain of muscle cramps.  She has recently been prescribed a magnesium supplement.  The patient also denies abdominal pain, nausea, vomiting, dysphagia, diarrhea, constipation, polyuria, polydipsia, dysuria, hematuria, frequency, urgency, abnormal bleeding or bruising, fever, chills, mood swings, change in skin or hair texture, change in voice quality, auditory or visual problems, allergic reactions .   She has a long history of coronary disease. She underwent proximal LAD bare-metal stenting and balloon angioplasty of the mid LAD in 2013. She has had persistent ischemia in the territory  of the diagonal artery on nuclear stress test performed over the last few years. Coronary angiography performed May 2016 showed a widely patent LAD stent, 60% ostial circumflex stenosis, 20-30% right coronary artery stenosis. There was concern about possible left coronary artery stenosis but fractional flow reserve was normal (0.96 at baseline, 0.91 during intravenous adenosine infusion). She had good anginal response to Ranexa but developed severe constipation and could not tolerate the medication. She has been intolerant to beta blockers due to bradycardia. She has preserved left ventricular systolic function but has had episodes of acute exacerbation of diastolic heart failure attributed to hypertensive heart disease. December 2015, she was critically ill with an acute left iliofemoral DVT with anticoagulation complicated by retroperitoneal hematoma and hemorrhagic shock, requiring placement of an inferior vena cava filter. The filter was removed in June 2016. Coronary angiography in July 2018 showed unchanged anatomy of the eccentric stenosis in the left main coronary artery with noncritical FFR (0.89 in the LAD, 0.86 in the LCx).  Past Medical History:  Diagnosis Date  . Arthritis    right knee  . Cancer [facility]) yrs ago   skin cancer removed from face  . Chronic diastolic CHF (congestive heart failure) ([place])   . Chronic venous insufficiency    LEA VENOUS, 10/17/2011 - mild reflux in bilateral common femoral veins  . CKD (chronic kidney disease), stage III ([place])   . Coronary artery disease    a. s/p PCI/BMS to prox LAD and balloon angioplasty to mLAD  with suboptimal result in 2000. b. Abnl nuc 2012, cath 09/2011 - showed that the overall territory of potential ischemia was small and attributable to a moderately diseased small second diagonal artery. Med rx.  Marland Kitchen Dyspnea    with exertion  . Headache   . Hyperlipidemia   . Hypertension   . Hypertensive heart disease   . Hypothyroidism   . Obesity     . Stroke [facility])    pt. states she had "light stroke" in sept. 1980  . Urinary incontinence     Past Surgical History:  Procedure Laterality Date  . ABDOMINAL HYSTERECTOMY  1970's   complete  . ANKLE ARTHROSCOPY Right 07/10/2017   Procedure: ANKLE ARTHROSCOPY;  Surgeon: [name], DPM;  Location: [place];  Service: Podiatry;  Laterality: Right;  . BACK SURGERY  07/26/2016   cervical neck surgery, [facility]  . BLADDER SURGERY  2010   WITH MESH  bladder tach  . bunion removal surgery Bilateral 15 yrs ago  . CARDIAC CATHETERIZATION Left 09/25/2011   Medical management  . CARDIAC CATHETERIZATION Left 03/25/2001   Normal LV function, LAD residual narrowing of less than 10%, normal ramus intermediate, circumflex, and RCA,   . CARDIAC CATHETERIZATION  09/04/1999   LAD, 3x76mm Tetra stent resulting in a reduction of the 80% stenosis to 0% residual  . CARDIAC CATHETERIZATION N/A 01/26/2015   Procedure: Right/Left Heart Cath and Coronary Angiography;  Surgeon: Troy Sine, MD;  Location: [place] CV LAB;  Service: Cardiovascular;  Laterality: N/A;  . CARDIAC CATHETERIZATION N/A 01/27/2015   Procedure: Intravascular Pressure Wire/FFR Study;  Surgeon: Burnell Blanks, MD;  Location: [place] CV LAB;  Service: Cardiovascular;  Laterality: N/A;  . CARDIAC CATHETERIZATION N/A 01/27/2015   Procedure: Right Heart Cath;  Surgeon: Burnell Blanks, MD;  Location: [place] CV LAB;  Service: Cardiovascular;  Laterality: N/A;  . CHOLECYSTECTOMY    . COLONOSCOPY WITH PROPOFOL N/A 03/07/2017   Procedure: COLONOSCOPY WITH PROPOFOL;  Surgeon: Juanita Craver, MD;  Location: [facility];  Service: Endoscopy;  Laterality: N/A;  . CORONARY STENT PLACEMENT     LAD x 1  . ganglion cyst removal  yrs ago   x 2  . INTRAVASCULAR PRESSURE WIRE/FFR STUDY N/A 04/05/2017   Procedure: Intravascular Pressure Wire/FFR Study;  Surgeon: Martinique, Peter M, MD;  Location: [place] [facility];   Service: Cardiovascular;  Laterality: N/A;  . LEFT HEART CATH AND CORONARY ANGIOGRAPHY N/A 04/05/2017   Procedure: Left Heart Cath and Coronary Angiography;  Surgeon: Martinique, Peter M, MD;  Location: [place] CV [facility];  Service: Cardiovascular;  Laterality: N/A;  . LEFT HEART CATHETERIZATION WITH CORONARY ANGIOGRAM N/A 09/25/2011   Procedure: LEFT HEART CATHETERIZATION WITH CORONARY ANGIOGRAM;  Surgeon: Sanda Klein, MD;  Location: [place] CATH LAB;  Service: Cardiovascular;  Laterality: N/A;  . multiple bladder surgeries to remove mesh    . ROTATOR CUFF REPAIR Right   . stent to groin  08/2014   left leg  . TENDON REPAIR Right 07/10/2017   Procedure: RIGHT PERONEAL TENDON REPAIR;  Surgeon: [name], DPM;  Location: [place];  Service: Podiatry;  Laterality: Right;    Outpatient Medications Prior to Visit  Medication Sig Dispense Refill  . acetaminophen (TYLENOL) 500 MG tablet Take 500-1,000 mg by mouth every 6 (six) hours as needed (for pain.).     Marland Kitchen Apoaequorin (PREVAGEN) 10 MG CAPS Take 1 capsule by mouth daily.     Marland Kitchen aspirin  EC 81 MG tablet Take 1 tablet (81 mg total) by mouth daily. 90 tablet 3  . bisacodyl (DULCOLAX) 5 MG EC tablet Take 5 mg by mouth 2 (two) times daily.    . Calcium Carbonate-Vitamin D (CALCIUM 600+D PO) Take 1 tablet by mouth daily.    . diphenhydrAMINE (BENADRYL) 25 MG tablet Take 25 mg by mouth every 6 (six) hours as needed for itching.     . Homeopathic Products (LEG CRAMP RELIEF) SUBL Place 2 tablets under the tongue at bedtime as needed (cramps).    . isosorbide mononitrate (IMDUR) 30 MG 24 hr tablet TAKE 2 TABLETS BY MOUTH 2 TIMES DAILY 120 tablet 11  . levothyroxine (SYNTHROID, LEVOTHROID) 137 MCG tablet Take 137 mcg by mouth daily before breakfast.    . losartan-hydrochlorothiazide (HYZAAR) 100-25 MG tablet Take 0.5 tablets by mouth daily.  0  . Menthol-Methyl Salicylate (MUSCLE RUB) 10-15 % CREA Apply 1 application topically 2 (two) times daily as needed for  muscle pain (knee pain).     . Miconazole Nitrate 2 % POWD Apply 1 application topically 2 (two) times daily. (Patient taking differently: Apply 1 application topically 2 (two) times daily as needed (for rash around lower stomach/groin/legs). ) 5 g 0  . Multiple Vitamin (MULTIVITAMIN WITH MINERALS) TABS tablet Take 1 tablet by mouth 2 (two) times daily. Centrum 50+     . nitroGLYCERIN (NITROSTAT) 0.4 MG SL tablet Place 1 tablet (0.4 mg total) under the tongue every 5 (five) minutes as needed for chest pain. 25 tablet 2  . nystatin (MYCOSTATIN/NYSTOP) powder Apply 1 g topically daily as needed (rash).     . ondansetron (ZOFRAN) 4 MG tablet Take 1 tablet (4 mg total) by mouth daily as needed for nausea or vomiting. 30 tablet 1  . oxyCODONE-acetaminophen (PERCOCET) 10-325 MG tablet Take 1 tablet by mouth every 4 (four) hours as needed for pain. 30 tablet 0  . pravastatin (PRAVACHOL) 80 MG tablet Take 80 mg by mouth at bedtime.    . vitamin C (ASCORBIC ACID) 500 MG tablet Take 1,000-3,000 mg by mouth daily.     . fenofibrate 160 MG tablet Take 160 mg by mouth daily.   0  . furosemide (LASIX) 20 MG tablet Take 10 mg by mouth 2 (two) times daily.     . potassium chloride (K-DUR) 10 MEQ tablet Take 10 mEq by mouth 2 (two) times daily.     No facility-administered medications prior to visit.      Allergies:   Atorvastatin; Penicillins; Aminophylline; Contrast media [iodinated diagnostic agents]; Latex; Adhesive [tape]; Betadine [povidone iodine]; Ciprofloxacin; Codeine; Gelnique [oxybutynin]; Ranexa [ranolazine]; and Shellfish allergy   Social History   Socioeconomic History  . Marital status: Married    Spouse name: None  . Number of children: None  . Years of education: None  . Highest education level: None  Social Needs  . Financial resource strain: None  . Food insecurity - worry: None  . Food insecurity - inability: None  . Transportation needs - medical: None  . Transportation needs -  non-medical: None  Occupational History  . None  Tobacco Use  . Smoking status: Never Smoker  . Smokeless tobacco: Never Used  Substance and Sexual Activity  . Alcohol use: No  . Drug use: No  . Sexual activity: None  Other Topics Concern  . None  Social History Narrative  . None     Family History:  The patient's family history includes Cancer in  her mother; Heart disease in her brother, father, and sister.   ROS:   Please see the history of present illness.    ROS All other systems reviewed and are negative.   PHYSICAL EXAM:   VS:  BP 130/74   Pulse 72   Ht 5' 5.5" (1.664 m)   Wt 222 lb 9.6 oz (101 kg)   BMI 36.48 kg/m     General: Alert, oriented x3, no distress.. Mildly obese Head: no evidence of trauma, PERRL, EOMI, no exophtalmos or lid lag, no myxedema, no xanthelasma; normal ears, nose and oropharynx Neck: Very difficult to see her jugular venous pulsations, but she appears to have hepatojugular reflux; brisk carotid pulses without delay and no carotid bruits Chest: clear to auscultation, no signs of consolidation by percussion or palpation, normal fremitus, symmetrical and full respiratory excursions Cardiovascular: normal position and quality of the apical impulse, regular rhythm, normal first and second heart sounds, no murmurs, rubs or gallops Abdomen: no tenderness or distention, no masses by palpation, no abnormal pulsatility or arterial bruits, normal bowel sounds, no hepatosplenomegaly, Inguinal intertrigo Extremities: no clubbing, cyanosis bilateral 2+ ankle edema; 2+ radial, ulnar and brachial pulses bilaterally; 2+ right femoral, posterior tibial and dorsalis pedis pulses; 2+ left femoral, posterior tibial and dorsalis pedis pulses; no subclavian or femoral bruits Neurological: grossly nonfocal   Wt Readings from Last 3 Encounters:  11/14/17 222 lb 9.6 oz (101 kg)  07/16/17 212 lb (96.2 kg)  07/10/17 214 lb (97.1 kg)      Studies/Labs Reviewed:    EKG:  EKG is ordered today.  NSR, T wave inversion in leads V2-V3 and minor ST segment depression or with biphasic T waves in leads V4-V6   Recent Labs: 07/09/2017: BUN 18; Creatinine, Ser 1.10; Hemoglobin 14.7; Platelets 189; Potassium 3.9; Sodium 139   Lipid Panel    Component Value Date/Time   CHOL 187 06/27/2015 1336   TRIG 239 (H) 06/27/2015 1336   HDL 45 (L) 06/27/2015 1336   CHOLHDL 4.2 06/27/2015 1336   VLDL 48 (H) 06/27/2015 1336   LDLCALC 94 06/27/2015 1336   Labs 09/04/2016 total cholesterol 169, triglycerides 160, HDL 40, LDL 97, hemoglobin A1c 6.1%, glucose 104, creatinine 1.16, BUN 19, TSH 0.40  CATH 04/05/2017   LM lesion, 70 %stenosed.  Mid LAD lesion, 0 %stenosed at site of prior stent.  Ost Cx lesion, 60 %stenosed.  Prox RCA-1 lesion, 20 %stenosed.  Prox RCA-2 lesion, 30 %stenosed.  The left ventricular systolic function is normal.  LV end diastolic pressure is normal.  The left ventricular ejection fraction is 55-65% by visual estimate.   1. 70% eccentric distal left main stenosis. Angiographically similar to 2016. FFR 0.89 into the LAD and 0.86 into the LCx suggesting that this lesion is not flow limiting.  2. Otherwise nonobstructive CAD. Patent stent in LAD. 3. Normal LV function 4. Normal LVEDP  Plan: recommend continued medical therapy.   ASSESSMENT:    1. Chronic diastolic CHF (congestive heart failure) ([place])   2. Coronary artery disease involving native coronary artery of native heart without angina pectoris   3. Essential hypertension   4. PAD (peripheral artery disease) ([place])   5. Dyslipidemia   6. CKD (chronic kidney disease) stage 3, GFR 30-59 ml/min (H[facility])   7. Medication management      PLAN:  In order of problems listed above:  1. CHF: She has a narrow margin of compensation between heart failure and kidney failure.  By exam  today she appears to be hypervolemic.  Increase diuretics.  For now, will shoot for a target  weight of 210 pounds or so.  Increase potassium supplement to avoid muscle cramps.  She is already on magnesium.  Repeat labs in short period of time.  I am not sure that her cramps are due to electrolyte imbalance or diuretics.  We should stop her fenofibrate. 2. CAD: He does not have angina.  She is known to have a moderate stenosis in the left main coronary artery that was not significant by pressure wire analysis at cardiac catheterization performed about 6 months ago. 3. HTN: Blood pressure should always be checked in her right arm since she appears to have left subclavian artery stenosis.  Blood pressure today is well controlled.   4. PAD: Does not have symptoms of left subclavian steal syndrome or left arm claudication 5. Hyperlipidemia: On maximum dose of pravastatin.  Discussed PCS K9 inhibitors, seemed to be financially prohibitive last time we talked, but prices have since come down.  We will explore this again.  Target LDL under 70.  We will get an updated lipid profile.  We will stop the fenofibrate to see if this leads to improvement in her muscle cramps.  She does have some hypertriglyceridemia, but the LDL is the primary target for therapy. 6. CKD: Most recent labs show improvement in creatinine level, should allow more liberal treatment with diuretics.   Medication Adjustments/Labs and Tests Ordered: Current medicines are reviewed at length with the patient today.  Concerns regarding medicines are outlined above.  Medication changes, Labs and Tests ordered today are listed in the Patient Instructions below. Patient Instructions  Medication Instructions: Dr Sallyanne Kuster has recommended making the following medication changes: 1. STOP Fenofibrate 2. INCREASE Furosemide (Lasix) - take 2 tablet every morning and take 1 tablet every afternoon 3. INCREASE Potassium - take 2 tablets twice daily  Labwork: Your physician recommends that you return for lab work in 4  weeks.  Testing/Procedures: NONE ORDERED  Follow-up: Your physician recommends that you schedule a follow-up appointment in 1 month with a NP/PA.  Dr Sallyanne Kuster recommends that you schedule a follow-up appointment in 3 months.  If you need a refill on your cardiac medications before your next appointment, please call your pharmacy.      Signed, Sanda Klein, MD  11/15/2017 2:46 PM    [facility] [place], [place],   [address] Phone: [phone]; Fax: [phone]

## 2017-11-14 NOTE — Patient Instructions (Signed)
Medication Instructions: Dr Sallyanne Kuster has recommended making the following medication changes: 1. STOP Fenofibrate 2. INCREASE Furosemide (Lasix) - take 2 tablet every morning and take 1 tablet every afternoon 3. INCREASE Potassium - take 2 tablets twice daily  Labwork: Your physician recommends that you return for lab work in 4 weeks.  Testing/Procedures: NONE ORDERED  Follow-up: Your physician recommends that you schedule a follow-up appointment in 1 month with a NP/PA.  Dr Sallyanne Kuster recommends that you schedule a follow-up appointment in 3 months.  If you need a refill on your cardiac medications before your next appointment, please call your pharmacy.

## 2017-11-15 ENCOUNTER — Telehealth: Payer: Self-pay | Admitting: *Deleted

## 2017-11-15 ENCOUNTER — Encounter: Payer: Self-pay | Admitting: Cardiovascular Disease

## 2017-11-15 MED ORDER — POTASSIUM CHLORIDE ER 10 MEQ PO TBCR: 20.0000 meq | EXTENDED_RELEASE_TABLET | Freq: Two times a day (BID) | ORAL | 3 refills | Status: DC

## 2017-11-15 MED ORDER — FUROSEMIDE 40 MG PO TABS: ORAL_TABLET | ORAL | 3 refills | Status: DC

## 2017-11-15 NOTE — Telephone Encounter (Signed)
Spoke to patient . She stated need clarification for  medication change from 11/14/17  RN and patient went over AVS.  Medication sent to local pharmacy . Patient states she does not use [facility] mail order.

## 2017-11-15 NOTE — Telephone Encounter (Signed)
Patient left a msg on the refill vm requesting a call back to discuss the medication that Dr Sallyanne Kuster started her in yesterday. No preferred call back number was provided. Thanks, MI

## 2017-11-18 ENCOUNTER — Telehealth: Payer: Self-pay | Admitting: *Deleted

## 2017-11-18 DIAGNOSIS — E785 Hyperlipidemia, unspecified: Secondary | ICD-10-CM | POA: Diagnosis not present

## 2017-11-18 DIAGNOSIS — I5032 Chronic diastolic (congestive) heart failure: Secondary | ICD-10-CM | POA: Diagnosis not present

## 2017-11-18 DIAGNOSIS — Z79899 Other long term (current) drug therapy: Secondary | ICD-10-CM | POA: Diagnosis not present

## 2017-11-18 LAB — PRO B NATRIURETIC PEPTIDE: NT-Pro BNP: 101 pg/mL (ref 0–738)

## 2017-11-18 LAB — BASIC METABOLIC PANEL
BUN/Creatinine Ratio: 27 (ref 12–28)
BUN: 36 mg/dL — ABNORMAL HIGH (ref 8–27)
CO2: 31 mmol/L — ABNORMAL HIGH (ref 20–29)
Calcium: 10.5 mg/dL — ABNORMAL HIGH (ref 8.7–10.3)
Chloride: 92 mmol/L — ABNORMAL LOW (ref 96–106)
Creatinine, Ser: 1.31 mg/dL — ABNORMAL HIGH (ref 0.57–1.00)
GFR calc Af Amer: 44 mL/min/{1.73_m2} — ABNORMAL LOW (ref >59)
GFR calc non Af Amer: 38 mL/min/{1.73_m2} — ABNORMAL LOW (ref >59)
Glucose: 105 mg/dL — ABNORMAL HIGH (ref 65–99)
Potassium: 3.9 mmol/L (ref 3.5–5.2)
Sodium: 142 mmol/L (ref 134–144)

## 2017-11-18 LAB — LIPID PANEL
Chol/HDL Ratio: 4.1 ratio (ref 0.0–4.4)
Cholesterol, Total: 193 mg/dL (ref 100–199)
HDL: 47 mg/dL (ref >39)
LDL Calculated: 104 mg/dL — ABNORMAL HIGH (ref 0–99)
Triglycerides: 212 mg/dL — ABNORMAL HIGH (ref 0–149)
VLDL Cholesterol Cal: 42 mg/dL — ABNORMAL HIGH (ref 5–40)

## 2017-11-18 NOTE — Telephone Encounter (Signed)
Patient in office today for follow up labs. She was concerned about her blood pressure being 156/84 HR 82 this morning. She took blood pressure around the time she took her medications. Did check blood pressure and it was 136/72 and she felt better to know blood pressure improved. She is unable to take her Tylenol secondary to upcoming shoulder surgery and is having a lot of shoulder pain. Explained to patient that pain could cause the blood pressure to be elevated. She was very concerned would not be safe for her to have her surgery with this blood pressure reading. Explained that pain could be a factor in her blood pressure, continue same dose of medications  Advised would forward to Dr Sallyanne Kuster for review and will call if any changes.

## 2017-11-18 NOTE — Telephone Encounter (Signed)
BP is not that bad, elevation is likely caused by pain. That degree of HTN will not prevent surgery. She can take tylenol (she should take it if in pain.), it will not preclude surgery.

## 2017-11-18 NOTE — Telephone Encounter (Signed)
Spoke with patient, per patient no Tylenol until after surgery

## 2017-11-22 ENCOUNTER — Telehealth: Payer: Self-pay | Admitting: *Deleted

## 2017-11-22 NOTE — Telephone Encounter (Addendum)
   [facility] Pre-operative Risk Assessment    Request for surgical clearance:  1. What type of surgery is being performed? Left shoulder Arthroscopy with Rotator cuff repair.  2. When is this surgery scheduled? 12/12/17   3. What type of clearance is required (medical clearance vs. Pharmacy clearance to hold med vs. Both)? Both  4. Are there any medications that need to be held prior to surgery and how long? Aspirin  5. Practice name and name of physician performing surgery? [facility],    6. What is your office phone and fax number? Fax: [phone], Phone: [phone]  7. Anesthesia type (None, local, MAC, general) ? General    Ricci Barker 11/22/2017, 5:01 PM  _________________________________________________________________   (provider comments below)

## 2017-11-25 ENCOUNTER — Telehealth: Payer: Self-pay | Admitting: Cardiovascular Disease

## 2017-11-25 NOTE — Telephone Encounter (Signed)
Returned the call to the patient. She stated that she is still fatigued and has no energy to do anything anymore. She has been very active in the past.   She has been taking Furosemide 80 mg in the am and 40 mg in the afternoon as well as the potassium 20 meq. She stated that she lost 8 pounds in the beginning but since has gained 4 back. She currently weighs 222 pounds. She stated that her leg edema is better and she no longer has swelling in her lower extremities. She does have abdominal swelling to the point that she states that "she looks pregnant." Her shortness of breath is not any worse than it has been in the past.  Blood pressure this morning was 145/75 and heart rate was 76.  She was also calling to get an update on taking Zetia or Repatha (she prefers the Zetia).   Patients main concerns: -Extreme fatigue -abdominal swelling -Starting Zetia

## 2017-11-25 NOTE — Telephone Encounter (Signed)
Patient calling,   States that she feels really tired and is still not feeling well. Patient would like some advice on what to do next.

## 2017-11-26 ENCOUNTER — Telehealth: Payer: Self-pay | Admitting: Pharmacist Clinician (PhC)/ Clinical Pharmacy Specialist

## 2017-11-26 ENCOUNTER — Other Ambulatory Visit: Payer: Self-pay | Admitting: Pharmacist Clinician (PhC)/ Clinical Pharmacy Specialist

## 2017-11-26 DIAGNOSIS — E785 Hyperlipidemia, unspecified: Secondary | ICD-10-CM

## 2017-11-26 MED ORDER — EZETIMIBE 10 MG PO TABS: 10.0000 mg | ORAL_TABLET | Freq: Every day | ORAL | 5 refills | Status: DC

## 2017-11-26 NOTE — Telephone Encounter (Signed)
Pt scheduled on 3/21 w/ Pecolia Ades for clearance

## 2017-11-26 NOTE — Telephone Encounter (Signed)
-----   Message from Sanda Klein, MD sent at 11/19/2017  3:56 PM EST ----- Labs do not support fluid overload: if anything, a little on the dry side. Triglycerides are only mildly abnormal - stay off fenofibrate. LDL still too high and she was intolerant of other statins. Need to look into adding Zetia or Repatha.   Will ask our clinical pharmacists (please!) to look into which is an affordable option (Repatha would be the superior medication if not cost-prohibitive.)

## 2017-11-26 NOTE — Telephone Encounter (Signed)
Follow up   Patient is calling back in reference to feeling fatigue. She also states that a cardiac clearance is to be sent in for her to have shoulder surgery. But what she wants to know is do Dr. Orene Desanctis thinks that since she is so tired should she cancel the surgery. In addition to her cholesterol being high. Please call.

## 2017-11-26 NOTE — Telephone Encounter (Signed)
Spoke with patient about her options for further cholesterol reduction.  She would prefer to start with ezetimibe 10 mg daily.  Will send to pharmacy and have patient repeat labs in 8-10 weeks.  Patient voiced understanding with plan

## 2017-11-26 NOTE — Telephone Encounter (Signed)
Returned call to patient.She stated she don't feel like she is able to have rotor cuff surgery scheduled for 12/12/17.Stated her weakness has got worse.She is sob.Weight  223 lbs.No swelling in feet and lower legs,has swelling in abdomen.She will call and cancel surgery.Appointment moved up with Rosaria Ferries PA to 12/12/17 at 11:00 am.Message sent to Dr.Croitoru for review.

## 2017-11-26 NOTE — Telephone Encounter (Signed)
Pt needs follow up with APP before the 25th of March for clearance with recent CHF.

## 2017-11-27 NOTE — Telephone Encounter (Signed)
Please have her take metolazone 2.5 mg once tomorrow morning, 30 minutes before her AM furosemide and also take an EXTRA 20 mEq KCl above current amount. We are trying to diurese down to 210 lb or so. MCr

## 2017-11-28 MED ORDER — METOLAZONE 2.5 MG PO TABS: ORAL_TABLET | ORAL | 3 refills | Status: DC

## 2017-11-28 NOTE — Telephone Encounter (Signed)
Spoke to patient Dr.Croitoru's recommendations given.Advised to call back to report weight.

## 2017-11-29 ENCOUNTER — Telehealth: Payer: Self-pay | Admitting: Cardiovascular Disease

## 2017-11-29 NOTE — Telephone Encounter (Signed)
New Message   Pt c/o medication issue:  1. Name of Medication: losartan-hydrochlorothiazide (HYZAAR) 100-25 MG tablet  2. How are you currently taking this medication (dosage and times per day)? Take 0.5 tablets by mouth daily  3. Are you having a reaction (difficulty breathing--STAT)?   4. What is your medication issue? Pt states that she received a recall letter from her pharmacy and feels like this could be why she's having all the problems she is having. Please call

## 2017-11-29 NOTE — Telephone Encounter (Signed)
Advised to contact her pharmacy to see if she received the lot# that has been recalled to determine if her medication needed to be changed. Verbalized understanding of plan.

## 2017-12-02 ENCOUNTER — Telehealth: Payer: Self-pay | Admitting: Cardiovascular Disease

## 2017-12-02 MED ORDER — METOLAZONE 5 MG PO TABS: ORAL_TABLET | ORAL | 3 refills | Status: DC

## 2017-12-02 NOTE — Telephone Encounter (Signed)
Returned call to patient.She stated her weight still up at 219 lbs.Stated she took metolazone 2.5 mg on 3/12 30 mins before morning dose of lasix with no results.Stated she notices swelling in lower legs and feet at night.She feels bad, no energy.Advised no extender appointments this week.I will send message to Dr.Croitoru for advice.

## 2017-12-02 NOTE — Telephone Encounter (Signed)
Pt c/o medication issue:  1. Name of Medication: Metolazone   2. How are you currently taking this medication (dosage and times per day)? 5 mg , every other morning   3. Are you having a reaction (difficulty breathing--STAT)? No   4. What is your medication issue? Patient states that there is a discrepancy in the dosage instructions, she states that her verbal instructions were to take pill every day for five days until appt on 12-12-17 and the written instructions state to take everyday , there are 30 pills.

## 2017-12-02 NOTE — Telephone Encounter (Signed)
That's actually a slight improvement from 222. Please ask her to try 5 mg metolazone today and do that every other day, please MCr

## 2017-12-02 NOTE — Telephone Encounter (Signed)
Returned call to patient. Reiterated to patient the instructions from [place], LPN about metolazone - take 5mg  QOD 30 mins prior to AM dose of lasix. She states she picked up an Rx but it has different instructions. Advised she follow advice/instructions given this AM until she sees APP next week.

## 2017-12-02 NOTE — Telephone Encounter (Signed)
Returned call to patient Dr.Croitoru's recommendations given.Advised to keep appointment with Rosaria Ferries PA 12/12/17 at 11:00 am.

## 2017-12-02 NOTE — Telephone Encounter (Signed)
New message    Patient calling with concerns about fluid. Pleas call  Pt c/o swelling: STAT is pt has developed SOB within 24 hours  1) How much weight have you gained and in what time span? N/A  2) If swelling, where is the swelling located? FEET  3) Are you currently taking a fluid pill? YES  4) Are you currently SOB? YES  5) Do you have a log of your daily weights (if so, list)? 219  6) Have you gained 3 pounds in a day or 5 pounds in a week?NO  7) Have you traveled recently? NO

## 2017-12-05 ENCOUNTER — Ambulatory Visit: Payer: PPO | Admitting: Cardiology

## 2017-12-06 ENCOUNTER — Telehealth: Payer: Self-pay | Admitting: Cardiovascular Disease

## 2017-12-06 NOTE — Telephone Encounter (Signed)
New Message:   Pt is throwing up with metolazone (ZAROXOLYN) 5 MG tablet  pravastatin (PRAVACHOL) 80 MG tablet  furosemide (LASIX) 40 MG tablet And is mainly concerned about the fluid pill that she is currently taking and is feeling drained. No energy at all. Pt noticed yesterday when she was walking she almost blacked out.

## 2017-12-06 NOTE — Telephone Encounter (Signed)
OK 

## 2017-12-06 NOTE — Telephone Encounter (Signed)
Noted  

## 2017-12-06 NOTE — Telephone Encounter (Signed)
Returned call to pt she states that she is feeling fatigued and nauseous, she denies any fever or any other sx  she states that her BP is 122/75 HR 73 wt is 218 she states that this is what her weight was on Tuesday also She did not take the metolazone today as she takes it EOD she will take it tomorrow. She took lasix today and she did eat breakfast and states that she stays well hydrated. She will take ir easy until appt and stay hydrated. She will rest until her appt and she will call back if nausea worsens

## 2017-12-09 ENCOUNTER — Other Ambulatory Visit (HOSPITAL_COMMUNITY): Payer: PPO

## 2017-12-10 ENCOUNTER — Telehealth: Payer: Self-pay | Admitting: Cardiovascular Disease

## 2017-12-10 NOTE — Telephone Encounter (Signed)
New Message:    Please call asap,Pt says she is still retaining  A lot of fluid-she still weighs 218.She has a hurting under her right breast,under her arm and now in her back.She says it hurts so bad.When she moves around she get real SOB and heart rate increase so much when she moves around.She says she needs some relief.

## 2017-12-10 NOTE — Telephone Encounter (Signed)
Let's wait until her appt. MCr

## 2017-12-10 NOTE — Telephone Encounter (Signed)
Spoke with patient and her weight today is 218. When I tried to review medications with patient she said she was taking everything like she was supposed to. She stated her weight last night had gone up to 220. Explained to patient that weighing first thing in the morning after she urinates is the most accurate time to weigh. Last night she had pain under her right breast radiating to back and worse with movement, relieved with ES Tylenol. She rested well last night and SBP this am in the 130's. Patient does have an appointment to see Donnie Aho PA on 12/12/17. Advised patient no sooner appointments available but would forward to Dr Sallyanne Kuster to see if any further recommendations prior to appointment.

## 2017-12-12 ENCOUNTER — Ambulatory Visit: Admit: 2017-12-12 | Payer: PPO | Admitting: Orthopedic Surgery

## 2017-12-12 ENCOUNTER — Encounter: Payer: Self-pay | Admitting: Physician Assistant

## 2017-12-12 ENCOUNTER — Ambulatory Visit: Payer: PPO | Admitting: Physician Assistant

## 2017-12-12 VITALS — BP 116/70 | HR 77 | Ht 65.5 in | Wt 217.0 lb

## 2017-12-12 DIAGNOSIS — R0609 Other forms of dyspnea: Secondary | ICD-10-CM | POA: Diagnosis not present

## 2017-12-12 DIAGNOSIS — R0600 Dyspnea, unspecified: Secondary | ICD-10-CM

## 2017-12-12 DIAGNOSIS — I10 Essential (primary) hypertension: Secondary | ICD-10-CM

## 2017-12-12 DIAGNOSIS — Z79899 Other long term (current) drug therapy: Secondary | ICD-10-CM

## 2017-12-12 DIAGNOSIS — I5032 Chronic diastolic (congestive) heart failure: Secondary | ICD-10-CM | POA: Diagnosis not present

## 2017-12-12 SURGERY — SHOULDER ARTHROSCOPY WITH ROTATOR CUFF REPAIR AND SUBACROMIAL DECOMPRESSION
Anesthesia: General | Laterality: Left

## 2017-12-12 MED ORDER — METOLAZONE 5 MG PO TABS: ORAL_TABLET | ORAL | 3 refills | Status: DC

## 2017-12-12 NOTE — Patient Instructions (Addendum)
Medication Instructions:  Your physician has recommended you make the following change in your medication:  1) TAKE Metolazone 5mg  by mouth TWICE a week   Labwork: Your physician recommends that you return for lab work in: TODAY(Bmet/cbc/TSH)   Testing/Procedures: none  Follow-Up: Your physician recommends that you schedule a follow-up appointment in: pending lab work Keep scheduled appointment with Dr. Sallyanne Kuster in May   Any Other Special Instructions Will Be Listed Below (If Applicable).  Start measuring ALL liquid intake; Do not consume more that 2 quarts of liquid daily  Work to maintain your current weight. Weight yourself daily at home and keep a log.   Low-Sodium Eating Plan  Sodium, which is an element that makes up salt, helps you maintain a healthy balance of fluids in your body. Too much sodium can increase your blood pressure and cause fluid and waste to be held in your body. Your health care provider or dietitian may recommend following this plan if you have high blood pressure (hypertension), kidney disease, liver disease, or heart failure. Eating less sodium can help lower your blood pressure, reduce swelling, and protect your heart, liver, and kidneys. What are tips for following this plan? General guidelines  Most people on this plan should limit their sodium intake to 1,500-2,000 mg (milligrams) of sodium each day. (500 mg per meal) Reading food labels  The Nutrition Facts label lists the amount of sodium in one serving of the food. If you eat more than one serving, you must multiply the listed amount of sodium by the number of servings.  Choose foods with less than 140 mg of sodium per serving.  Avoid foods with 300 mg of sodium or more per serving. Shopping  Look for lower-sodium products, often labeled as "low-sodium" or "no salt added."  Always check the sodium content even if foods are labeled as "unsalted" or "no salt added".  Buy fresh  foods. ? Avoid canned foods and premade or frozen meals. ? Avoid canned, cured, or processed meats  Buy breads that have less than 80 mg of sodium per slice. Cooking  Eat more home-cooked food and less restaurant, buffet, and fast food.  Avoid adding salt when cooking. Use salt-free seasonings or herbs instead of table salt or sea salt. Check with your health care provider or pharmacist before using salt substitutes.  Cook with plant-based oils, such as canola, sunflower, or olive oil. Meal planning  When eating at a restaurant, ask that your food be prepared with less salt or no salt, if possible.  Avoid foods that contain MSG (monosodium glutamate). MSG is sometimes added to [place] food, bouillon, and some canned foods. What foods are recommended? The items listed may not be a complete list. Talk with your dietitian about what dietary choices are best for you. Grains Low-sodium cereals, including oats, puffed wheat and rice, and shredded wheat. Low-sodium crackers. Unsalted rice. Unsalted pasta. Low-sodium bread. Whole-grain breads and whole-grain pasta. Vegetables Fresh or frozen vegetables. "No salt added" canned vegetables. "No salt added" tomato sauce and paste. Low-sodium or reduced-sodium tomato and vegetable juice. Fruits Fresh, frozen, or canned fruit. Fruit juice. Meats and other protein foods Fresh or frozen (no salt added) meat, poultry, seafood, and fish. Low-sodium canned tuna and salmon. Unsalted nuts. Dried peas, beans, and lentils without added salt. Unsalted canned beans. Eggs. Unsalted nut butters. Dairy Milk. Soy milk. Cheese that is naturally low in sodium, such as ricotta cheese, fresh mozzarella, or Swiss cheese Low-sodium or reduced-sodium cheese. Cream  cheese. Yogurt. Fats and oils Unsalted butter. Unsalted margarine with no trans fat. Vegetable oils such as canola or olive oils. Seasonings and other foods Fresh and dried herbs and spices. Salt-free  seasonings. Low-sodium mustard and ketchup. Sodium-free salad dressing. Sodium-free light mayonnaise. Fresh or refrigerated horseradish. Lemon juice. Vinegar. Homemade, reduced-sodium, or low-sodium soups. Unsalted popcorn and pretzels. Low-salt or salt-free chips. What foods are not recommended? The items listed may not be a complete list. Talk with your dietitian about what dietary choices are best for you. Grains Instant hot cereals. Bread stuffing, pancake, and biscuit mixes. Croutons. Seasoned rice or pasta mixes. Noodle soup cups. Boxed or frozen macaroni and cheese. Regular salted crackers. Self-rising flour. Vegetables Sauerkraut, pickled vegetables, and relishes. Olives. [place] fries. Onion rings. Regular canned vegetables (not low-sodium or reduced-sodium). Regular canned tomato sauce and paste (not low-sodium or reduced-sodium). Regular tomato and vegetable juice (not low-sodium or reduced-sodium). Frozen vegetables in sauces. Meats and other protein foods Meat or fish that is salted, canned, smoked, spiced, or pickled. Bacon, ham, sausage, hotdogs, corned beef, chipped beef, packaged lunch meats, salt pork, jerky, pickled herring, anchovies, regular canned tuna, sardines, salted nuts. Dairy Processed cheese and cheese spreads. Cheese curds. Blue cheese. Feta cheese. String cheese. Regular cottage cheese. Buttermilk. Canned milk. Fats and oils Salted butter. Regular margarine. Ghee. Bacon fat. Seasonings and other foods Onion salt, garlic salt, seasoned salt, table salt, and sea salt. Canned and packaged gravies. Worcestershire sauce. Tartar sauce. Barbecue sauce. Teriyaki sauce. Soy sauce, including reduced-sodium. Steak sauce. Fish sauce. Oyster sauce. Cocktail sauce. Horseradish that you find on the shelf. Regular ketchup and mustard. Meat flavorings and tenderizers. Bouillon cubes. Hot sauce and Tabasco sauce. Premade or packaged marinades. Premade or packaged taco seasonings. Relishes.  Regular salad dressings. Salsa. Potato and tortilla chips. Corn chips and puffs. Salted popcorn and pretzels. Canned or dried soups. Pizza. Frozen entrees and pot pies. Summary  Eating less sodium can help lower your blood pressure, reduce swelling, and protect your heart, liver, and kidneys.  Most people on this plan should limit their sodium intake to 1,500-2,000 mg (milligrams) of sodium each day.  Canned, boxed, and frozen foods are high in sodium. Restaurant foods, fast foods, and pizza are also very high in sodium. You also get sodium by adding salt to food.  Try to cook at home, eat more fresh fruits and vegetables, and eat less fast food, canned, processed, or prepared foods. This information is not intended to replace advice given to you by your health care provider. Make sure you discuss any questions you have with your health care provider. Document Released: 02/23/2002 Document Revised: 08/27/2016 Document Reviewed: 08/27/2016 Elsevier Interactive Patient Education  [employer].   If you need a refill on your cardiac medications before your next appointment, please call your pharmacy.

## 2017-12-12 NOTE — Progress Notes (Signed)
Cardiology Office Note   Date:  12/12/2017   ID:  Jill Preston, DOB 26-Dec-1932, MRN [id number]  PCP:  Merrilee Seashore, MD  Cardiologist:  Dr. Juanetta Snow, PAC Katina Degree, Student-PA   No chief complaint on file.   History of Present Illness: Jill Preston is a 84 y.o. female with a history of CAD, chronic diastolic heart failure (09/173 EF 55-65%), chronic venous insufficiency, HTN, HLD and CKD Stage III.  Patient had BMS to pLAD with PTCA mLAD in 2013. Cath in July 2018 showed unchanged anatomy of the eccentric stenosis in the left main coronary artery in 2016 with non-critical FFR (0.89 in the LAD, 0.86 in the LCx). In 2015, patient had acute left iliofemoral DVT with anticoagulation complicated by retroperitoneal hematoma and shock, requiring an inferior IVC filter w/ subsequent removal once her condition improved to the point that she was placed on Xarelto. 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 Patient underwent stenting of left iliac vein at [place].   S/p hypercoagulable workup at [place]. patient had normal functional thrombophilia. Genetic thrombophilia demonstrated no evidence of prothrombin gene mutation or Factor V Leiden mutation. Patient had positive lupus inhibitor (most probably false positive due to Xarelto), cardiolipin antibodies IgG titers were normal; homocysteine concentration and lipoprotein(a) were normal.      Patient intolerant to Ranexa due to severe constipation and beta blockers due to bradycardia.   Patient was last seen by Dr. Sallyanne Kuster in 11/14/17, and at that time, was complaining of fatigue, decreased exercise tolerance and LEE. Lasix dosage increased (80 mg in morning, 40 mg at night) with recent addition of Metolazone 2.5 mg PO QD.   Jill Preston presents for today for medication management and  follow-up evaluation.   Today, she states that her overall volume status in her lower  extremities status is improving, but endorses at least two weeks of worsening fatigue and weakness. She reports that her functional status has declined tremendously, as she is not able to complete basic activities such cooking and cleaning like she used to because she is "too wiped out." she drinks at least 4, 16-20 oz glasses of water. She recently decided to cancel her orthopedic surgery procedure for a left shoulder rotator cuff repair today (12/12/17) due to her recent health status, knowing she would not feel strong enough for post-op rehab.   Ms. Tortora reports her weight status is fairly consistent (within the past week, recorded as 217-219 pounds), but she continues to have shortness of breath with "any exertion, other than just sitting." She endorses orthopnea, PND.   She also reports having intermittent chest pain with the shortness of breath, but she admits it is sometimes hard for her to differentiate her normal body aches from the chest pain. The CP starts when she is laying down and starts in the epigastric area.  Has taken SL nitroglycerin on two occassions in the past week with relief in about 30 minutes. Denies any palpitations, lightheadedness, dizziness or syncope.    Past Medical History:  Diagnosis Date  . Arthritis    right knee  . Cancer [facility]) yrs ago   skin cancer removed from face  . Chronic diastolic CHF (congestive heart failure) ([place])   . Chronic venous insufficiency    LEA VENOUS, 10/17/2011 - mild reflux in bilateral common femoral veins  . CKD (chronic kidney disease), stage III ([place])   . Coronary artery disease  a. s/p PCI/BMS to prox LAD and balloon angioplasty to mLAD with suboptimal result in 2000. b. Abnl nuc 2012, cath 09/2011 - showed that the overall territory of potential ischemia was small and attributable to a moderately diseased small second diagonal artery. Med rx.  Marland Kitchen Dyspnea    with exertion  . Headache   . Hyperlipidemia   . Hypertension   .  Hypertensive heart disease   . Hypothyroidism   . Obesity   . Stroke [facility])    pt. states she had "light stroke" in sept. 1980  . Urinary incontinence     Past Surgical History:  Procedure Laterality Date  . ABDOMINAL HYSTERECTOMY  1970's   complete  . ANKLE ARTHROSCOPY Right 07/10/2017   Procedure: ANKLE ARTHROSCOPY;  Surgeon: [name], DPM;  Location: [place];  Service: Podiatry;  Laterality: Right;  . BACK SURGERY  07/26/2016   cervical neck surgery, [facility]  . BLADDER SURGERY  2010   WITH MESH  bladder tach  . bunion removal surgery Bilateral 15 yrs ago  . CARDIAC CATHETERIZATION Left 09/25/2011   Medical management  . CARDIAC CATHETERIZATION Left 03/25/2001   Normal LV function, LAD residual narrowing of less than 10%, normal ramus intermediate, circumflex, and RCA,   . CARDIAC CATHETERIZATION  09/04/1999   LAD, 3x30mm Tetra stent resulting in a reduction of the 80% stenosis to 0% residual  . CARDIAC CATHETERIZATION N/A 01/26/2015   Procedure: Right/Left Heart Cath and Coronary Angiography;  Surgeon: Troy Sine, MD;  Location: [place] CV LAB;  Service: Cardiovascular;  Laterality: N/A;  . CARDIAC CATHETERIZATION N/A 01/27/2015   Procedure: Intravascular Pressure Wire/FFR Study;  Surgeon: Burnell Blanks, MD;  Location: [place] CV LAB;  Service: Cardiovascular;  Laterality: N/A;  . CARDIAC CATHETERIZATION N/A 01/27/2015   Procedure: Right Heart Cath;  Surgeon: Burnell Blanks, MD;  Location: [place] CV LAB;  Service: Cardiovascular;  Laterality: N/A;  . CHOLECYSTECTOMY    . COLONOSCOPY WITH PROPOFOL N/A 03/07/2017   Procedure: COLONOSCOPY WITH PROPOFOL;  Surgeon: Juanita Craver, MD;  Location: [facility];  Service: Endoscopy;  Laterality: N/A;  . CORONARY STENT PLACEMENT     LAD x 1  . ganglion cyst removal  yrs ago   x 2  . ILIAC VEIN ANGIOPLASTY / STENTING  02/15/2015  . INTRAVASCULAR PRESSURE WIRE/FFR STUDY N/A 04/05/2017    Procedure: Intravascular Pressure Wire/FFR Study;  Surgeon: Martinique, Peter M, MD;  Location: [place] CV LAB;  Service: Cardiovascular;  Laterality: N/A;  . IVC FILTER INSERTION  2016  . IVC FILTER REMOVAL  02/15/2015   at [facility]  . LEFT HEART CATH AND CORONARY ANGIOGRAPHY N/A 04/05/2017   Procedure: Left Heart Cath and Coronary Angiography;  Surgeon: Martinique, Peter M, MD;  Location: [place] CV LAB;  Service: Cardiovascular;  Laterality: N/A;  . LEFT HEART CATHETERIZATION WITH CORONARY ANGIOGRAM N/A 09/25/2011   Procedure: LEFT HEART CATHETERIZATION WITH CORONARY ANGIOGRAM;  Surgeon: Sanda Klein, MD;  Location: [place] CATH LAB;  Service: Cardiovascular;  Laterality: N/A;  . multiple bladder surgeries to remove mesh    . ROTATOR CUFF REPAIR Right   . stent to groin  08/2014   left leg  . TENDON REPAIR Right 07/10/2017   Procedure: RIGHT PERONEAL TENDON REPAIR;  Surgeon: [name], DPM;  Location: [place];  Service: Podiatry;  Laterality: Right;    Current Outpatient Medications  Medication Sig Dispense Refill  . acetaminophen (TYLENOL) 500 MG tablet  Take 500-1,000 mg by mouth every 6 (six) hours as needed (for pain.).     Marland Kitchen Apoaequorin (PREVAGEN) 10 MG CAPS Take 1 capsule by mouth daily.     Marland Kitchen aspirin EC 81 MG tablet Take 1 tablet (81 mg total) by mouth daily. 90 tablet 3  . bisacodyl (DULCOLAX) 5 MG EC tablet Take 5 mg by mouth 2 (two) times daily.    Marland Kitchen ezetimibe (ZETIA) 10 MG tablet Take 1 tablet (10 mg total) by mouth daily. 30 tablet 5  . furosemide (LASIX) 40 MG tablet Take 2 tablets (80 mg total) by mouth every morning AND 1 tablet (40 mg total) every evening. 270 tablet 3  . Homeopathic Products (LEG CRAMP RELIEF) SUBL Place 2 tablets under the tongue at bedtime as needed (cramps).    . isosorbide mononitrate (IMDUR) 30 MG 24 hr tablet TAKE 2 TABLETS BY MOUTH 2 TIMES DAILY 120 tablet 11  . levothyroxine (SYNTHROID, LEVOTHROID) 137 MCG tablet Take 137 mcg by mouth daily before  breakfast.    . losartan-hydrochlorothiazide (HYZAAR) 100-25 MG tablet Take 0.5 tablets by mouth daily.  0  . Magnesium 250 MG TABS Take 1 tablet by mouth daily.    . metolazone (ZAROXOLYN) 5 MG tablet Take 5 mg tablet by mouth TWICE weekly 30 tablet 3  . Miconazole Nitrate 2 % POWD Apply 1 application topically 2 (two) times daily. (Patient taking differently: Apply 1 application topically 2 (two) times daily as needed (for rash around lower stomach/groin/legs). ) 5 g 0  . Multiple Vitamin (MULTIVITAMIN WITH MINERALS) TABS tablet Take 1 tablet by mouth 2 (two) times daily. Centrum 50+     . nitroGLYCERIN (NITROSTAT) 0.4 MG SL tablet Place 1 tablet (0.4 mg total) under the tongue every 5 (five) minutes as needed for chest pain. 25 tablet 2  . nystatin (MYCOSTATIN/NYSTOP) powder Apply 1 g topically daily as needed (rash).     . potassium chloride (K-DUR) 10 MEQ tablet Take 2 tablets (20 mEq total) by mouth 2 (two) times daily. 180 tablet 3  . pravastatin (PRAVACHOL) 80 MG tablet Take 80 mg by mouth at bedtime.    . vitamin C (ASCORBIC ACID) 500 MG tablet Take 1,000-3,000 mg by mouth daily.      No current facility-administered medications for this visit.     Allergies:   Atorvastatin; Penicillins; Aminophylline; Contrast media [iodinated diagnostic agents]; Latex; Egg white (diagnostic); Adhesive [tape]; Betadine [povidone iodine]; Ciprofloxacin; Codeine; Gelnique [oxybutynin]; Ranexa [ranolazine]; and Shellfish allergy    Social History:  The patient  reports that she has never smoked. She has never used smokeless tobacco. She reports that she does not drink alcohol or use drugs.   Family History:  The patient's family history includes Cancer in her mother; Heart disease in her brother, father, and sister.    ROS:  Please see the history of present illness. All other systems are reviewed and negative.    PHYSICAL EXAM: VS:  BP 116/70   Pulse 77   Ht 5' 5.5" (1.664 m)   Wt 217 lb (98.4  kg)   BMI 35.56 kg/m  , BMI Body mass index is 35.56 kg/m. GEN: Well nourished, well developed, overweight female in no acute distress HEENT: normal for age  Neck: no JVD seen, difficult to assess secondary to body habitus, no carotid bruit, no masses Cardiac: RRR; no murmur, no rubs, or gallops Respiratory: Decreased breath sounds bases bilaterally, normal work of breathing GI: soft, nontender, nondistended, + BS  MS: no deformity or atrophy; trace lower extremity edema; distal pulses are 2+ in all 4 extremities  Skin: warm and dry, decreased skin turgor Neuro:  Strength and sensation are intact Psych: depressed affect, tearful during interview    Recent Labs: 07/09/2017: Hemoglobin 14.7; Platelets 189 11/18/2017: BUN 36; Creatinine, Ser 1.31; NT-Pro BNP 101; Potassium 3.9; Sodium 142    Lipid Panel    Component Value Date/Time   CHOL 193 11/18/2017 0857   TRIG 212 (H) 11/18/2017 0857   HDL 47 11/18/2017 0857   CHOLHDL 4.1 11/18/2017 0857   CHOLHDL 4.2 06/27/2015 1336   VLDL 48 (H) 06/27/2015 1336   LDLCALC 104 (H) 11/18/2017 0857     Wt Readings from Last 3 Encounters:  12/12/17 217 lb (98.4 kg)  11/14/17 222 lb 9.6 oz (101 kg)  07/16/17 212 lb (96.2 kg)     Other studies Reviewed:  CATH 04/05/2017   LM lesion, 70 %stenosed.  Mid LAD lesion, 0 %stenosed at site of prior stent.  Ost Cx lesion, 60 %stenosed.  Prox RCA-1 lesion, 20 %stenosed.  Prox RCA-2 lesion, 30 %stenosed.  The left ventricular systolic function is normal.  LV end diastolic pressure is normal.  The left ventricular ejection fraction is 55-65% by visual estimate.  1. 70% eccentric distal left main stenosis. Angiographically similar to 2016. FFR 0.89 into the LAD and 0.86 into the LCx suggesting that this lesion is not flow limiting.  2. Otherwise nonobstructive CAD. Patent stent in LAD. 3. Normal LV function 4. Normal LVEDP   ASSESSMENT AND PLAN:  1. DOE/ Chronic diastolic  Congestive Heart Failure - continues to complain of worsening fatigue, weakness, and SOB  - appears to be euvolemic on exam, weight consistent this past week with dry weight ~217 lb - orthostatic BP readings at today's visit positive with positional changes  - will continue with Lasix 80 mg in morning and 40 mg at night - will decrease Metolazone to 5 mg twice a week - order CMP, CBC, BNP today - due to SOB and hx of DVT 2105, will order D-dimer to r/o PE - Hx May-Thurner syndrome causing DVT 2015, s/p endovascular repair -  May need to follow up with Dr. Sallyanne Kuster before her scheduled May appt if labs are abnormal  - instructed patient to limit her fluid intake to 2 quarts/day   2. CAD - currently without complaints of angina  - known to have a moderate stenosis in the left main coronary artery that was not significant by pressure wire analysis at cardiac catheterization performed 03/2017   3. HTN - Blood pressure should always be checked in her right arm since she appears to have left subclavian artery stenosis - BP today stable, 116/70  - continue current medication regimen with Losartan-HCTZ  4. HLD - FLP 11/18/17: cholesterol 193. HDL: 47, TG: 212, LDL: 102 - Continue Pravastatin (on max dose) and Zetia   6. CKD Stage III - last BMP 11/18/17 showed increased Cr 1.10 >> 1.31 - will order CMP to recheck electrolytes and kidney function again today   Current medicines are reviewed at length with the patient today.  The patient does not have concerns regarding medicines.  The following changes have been made: Decrease metolazone  Labs/ tests ordered today include:   Orders Placed This Encounter  Procedures  . Basic metabolic panel  . CBC with Differential/Platelet  . TSH  . Brain natriuretic peptide  . D-Dimer, Quantitative   Disposition:   FU  with Dr. Sallyanne Kuster  Signed, Rosaria Ferries, PA-C  12/12/2017 5:43 PM    [place] Phone: [phone];  Fax: [phone]  This note was written with the assistance of speech recognition software.  Please excuse any transcriptional errors.

## 2017-12-13 ENCOUNTER — Telehealth: Payer: Self-pay

## 2017-12-13 DIAGNOSIS — I5032 Chronic diastolic (congestive) heart failure: Secondary | ICD-10-CM

## 2017-12-13 LAB — CBC WITH DIFFERENTIAL/PLATELET
Basophils Absolute: 0.1 10*3/uL (ref 0.0–0.2)
Basos: 1 %
EOS (ABSOLUTE): 0.3 10*3/uL (ref 0.0–0.4)
Eos: 4 %
Hematocrit: 47.6 % — ABNORMAL HIGH (ref 34.0–46.6)
Hemoglobin: 16.4 g/dL — ABNORMAL HIGH (ref 11.1–15.9)
Immature Grans (Abs): 0 10*3/uL (ref 0.0–0.1)
Immature Granulocytes: 0 %
Lymphocytes Absolute: 1.7 10*3/uL (ref 0.7–3.1)
Lymphs: 22 %
MCH: 32.5 pg (ref 26.6–33.0)
MCHC: 34.5 g/dL (ref 31.5–35.7)
MCV: 94 fL (ref 79–97)
Monocytes Absolute: 0.7 10*3/uL (ref 0.1–0.9)
Monocytes: 9 %
Neutrophils Absolute: 4.7 10*3/uL (ref 1.4–7.0)
Neutrophils: 64 %
Platelets: 214 10*3/uL (ref 150–379)
RBC: 5.05 x10E6/uL (ref 3.77–5.28)
RDW: 12.9 % (ref 12.3–15.4)
WBC: 7.4 10*3/uL (ref 3.4–10.8)

## 2017-12-13 LAB — BASIC METABOLIC PANEL
BUN/Creatinine Ratio: 40 — ABNORMAL HIGH (ref 12–28)
BUN: 48 mg/dL — ABNORMAL HIGH (ref 8–27)
CO2: 30 mmol/L — ABNORMAL HIGH (ref 20–29)
Calcium: 10.4 mg/dL — ABNORMAL HIGH (ref 8.7–10.3)
Chloride: 88 mmol/L — ABNORMAL LOW (ref 96–106)
Creatinine, Ser: 1.2 mg/dL — ABNORMAL HIGH (ref 0.57–1.00)
GFR calc Af Amer: 49 mL/min/{1.73_m2} — ABNORMAL LOW (ref >59)
GFR calc non Af Amer: 42 mL/min/{1.73_m2} — ABNORMAL LOW (ref >59)
Glucose: 104 mg/dL — ABNORMAL HIGH (ref 65–99)
Potassium: 3.1 mmol/L — ABNORMAL LOW (ref 3.5–5.2)
Sodium: 139 mmol/L (ref 134–144)

## 2017-12-13 LAB — BRAIN NATRIURETIC PEPTIDE: BNP: 59.8 pg/mL (ref 0.0–100.0)

## 2017-12-13 LAB — TSH: TSH: 0.266 u[IU]/mL — ABNORMAL LOW (ref 0.450–4.500)

## 2017-12-13 LAB — D-DIMER, QUANTITATIVE: D-DIMER: 0.62 mg/L FEU — ABNORMAL HIGH (ref 0.00–0.49)

## 2017-12-13 NOTE — Telephone Encounter (Signed)
-----   Message from Lonn Georgia, PA-C sent at 12/13/2017  1:22 PM EDT -----   ----- Message ----- From: Sanda Klein, MD Sent: 12/13/2017  10:38 AM To: Lonn Georgia, PA-C  D-dimer is not impressive. BNP still pending. Fatigue may be related to low K - let's try repleting that and see how she feels. Will keep eyes open for BNP, but I suspect it will be normal. MCr ----- Message ----- From: Reola Mosher Sent: 12/12/2017   5:46 PM To: Sanda Klein, MD

## 2017-12-13 NOTE — Progress Notes (Signed)
Please ask her to take 4 extra potassium tablets today and tomorrow. Take an extra 2 potassium tabs whenever she takes the metolazone. Please forward her TSH to her PCP.  Repeat BMET in 2 weeks.  Thanks

## 2017-12-16 ENCOUNTER — Telehealth: Payer: Self-pay | Admitting: *Deleted

## 2017-12-16 DIAGNOSIS — R0600 Dyspnea, unspecified: Secondary | ICD-10-CM

## 2017-12-16 DIAGNOSIS — R0609 Other forms of dyspnea: Secondary | ICD-10-CM

## 2017-12-16 NOTE — Telephone Encounter (Signed)
Author: Lonn Georgia, PA-C Service: Cardiology Author Type: Physician Assistant Certified  Filed: 04/19/2335 1:22 PM Encounter Date: 12/12/2017 Status: Signed  Editor: Reola Mosher (Physician Assistant Certified)    Please ask her to take 4 extra potassium tablets today and tomorrow. Take an extra 2 potassium tabs whenever she takes the metolazone. Please forward her TSH to her PCP.  Repeat BMET in 2 weeks.  Thanks

## 2017-12-16 NOTE — Telephone Encounter (Signed)
Referral placed to pulmonology.   Message sent to schedulers.

## 2017-12-16 NOTE — Telephone Encounter (Deleted)
-----   Message from Lonn Georgia, PA-C sent at 12/13/2017  1:22 PM EDT -----   ----- Message ----- From: Sanda Klein, MD Sent: 12/13/2017  10:38 AM To: Lonn Georgia, PA-C  D-dimer is not impressive. BNP still pending. Fatigue may be related to low K - let's try repleting that and see how she feels. Will keep eyes open for BNP, but I suspect it will be normal. MCr ----- Message ----- From: Reola Mosher Sent: 12/12/2017   5:46 PM To: Sanda Klein, MD

## 2017-12-16 NOTE — Telephone Encounter (Signed)
-----   Message from Roland Earl sent at 12/16/2017  4:34 PM EDT ----- Dr. Vaughan Browner Friday 12/27/17 @ 11:30am----patient is aware ----- Message ----- From: Silverio Lay, RN Sent: 12/16/2017   4:19 PM To: Dionne Bucy Truitt, CMA, [facility] Admin Pool  Referral placed to pulmonology per Dr. Sallyanne Kuster.     Thanks!

## 2017-12-16 NOTE — Telephone Encounter (Signed)
Called patient on Friday 12/13/2017. Patient reported, initially, that she didn't have any potassium. Called patient's preferred pharmacy. Pharmacy personnel determined that Mrs Warriner picked up potassium earlier in the month.   Returned call to Mrs Krisher, notified of pharmacy comments. We went through each of her medications and found potassium (patient's bottle says KlorCon). Attempted to review medications. Patient was very confused and emotional. I asked patient if she had a calendar to write these instructions down. Reviewed recommendations once again, slowly and wrote medication instructions for every day per [facility] instructions below.   Greater than 35 minutes spent consoling patient and reviewing instructions. Patient verbalized understanding, agreed with plan, and appreciated time/patience.

## 2017-12-16 NOTE — Telephone Encounter (Signed)
-----   Message from Lonn Georgia, PA-C sent at 12/16/2017  4:11 PM EDT -----   ----- Message ----- From: Sanda Klein, MD Sent: 12/15/2017  12:25 PM To: Lonn Georgia, PA-C  BNP very low. Doubt this is CHF. Please refer to Pulmonary MCr ----- Message ----- From: Lonn Georgia, PA-C Sent: 12/13/2017  11:00 AM To: Sanda Klein, MD, Lonn Georgia, PA-C  Please review labs on Ms Vita and advise plan.  Thanks

## 2017-12-19 ENCOUNTER — Ambulatory Visit: Payer: PPO | Admitting: Physician Assistant

## 2017-12-23 ENCOUNTER — Telehealth: Payer: Self-pay | Admitting: Physician Assistant

## 2017-12-23 NOTE — Telephone Encounter (Signed)
New Message:     Pt said she received a lab order and another sheet in the mail.She had lab work on 12-12-17,never received the results.She needs to know if she needs more lab work.

## 2017-12-23 NOTE — Telephone Encounter (Signed)
Spoke with patient and reviewed labs again. She will come end of the week for repeat labs.

## 2017-12-27 ENCOUNTER — Other Ambulatory Visit: Payer: Self-pay

## 2017-12-27 ENCOUNTER — Encounter (HOSPITAL_COMMUNITY): Payer: Self-pay

## 2017-12-27 ENCOUNTER — Encounter: Payer: Self-pay | Admitting: Pulmonary Disease

## 2017-12-27 ENCOUNTER — Ambulatory Visit: Payer: PPO | Admitting: Pulmonary Disease

## 2017-12-27 ENCOUNTER — Ambulatory Visit (HOSPITAL_COMMUNITY)
Admission: RE | Admit: 2017-12-27 | Discharge: 2017-12-27 | Disposition: A | Discharge status: Home / Self Care | Payer: PPO | Source: Ambulatory Visit | Attending: Pulmonary Disease | Admitting: Pulmonary Disease

## 2017-12-27 ENCOUNTER — Ambulatory Visit (HOSPITAL_BASED_OUTPATIENT_CLINIC_OR_DEPARTMENT_OTHER)
Admission: RE | Admit: 2017-12-27 | Discharge: 2017-12-27 | Disposition: A | Discharge status: Home / Self Care | Payer: PPO | Source: Ambulatory Visit | Attending: Cardiovascular Disease | Admitting: Cardiovascular Disease

## 2017-12-27 ENCOUNTER — Encounter (HOSPITAL_COMMUNITY)
Admission: RE | Admit: 2017-12-27 | Discharge: 2017-12-27 | Disposition: A | Discharge status: Home / Self Care | Payer: PPO | Source: Ambulatory Visit | Attending: Pulmonary Disease | Admitting: Pulmonary Disease

## 2017-12-27 ENCOUNTER — Other Ambulatory Visit (HOSPITAL_COMMUNITY): Payer: Self-pay | Admitting: Internal Medicine

## 2017-12-27 VITALS — BP 106/60 | HR 88 | Ht 65.5 in | Wt 217.6 lb

## 2017-12-27 DIAGNOSIS — Z86718 Personal history of other venous thrombosis and embolism: Secondary | ICD-10-CM

## 2017-12-27 DIAGNOSIS — R0602 Shortness of breath: Secondary | ICD-10-CM

## 2017-12-27 DIAGNOSIS — I872 Venous insufficiency (chronic) (peripheral): Secondary | ICD-10-CM | POA: Insufficient documentation

## 2017-12-27 MED ORDER — TECHNETIUM TO 99M ALBUMIN AGGREGATED
5.0000 | Freq: Once | INTRAVENOUS | Status: AC | PRN
  Administered 2017-12-27: 5 via INTRAVENOUS

## 2017-12-27 MED ORDER — TECHNETIUM TC 99M DIETHYLENETRIAME-PENTAACETIC ACID
30.2000 | Freq: Once | INTRAVENOUS | Status: AC | PRN
  Administered 2017-12-27: 30.2 via RESPIRATORY_TRACT

## 2017-12-27 NOTE — Patient Instructions (Signed)
We will schedule you for a chest x-ray today Order a VQ scan and lower extremity duplex to look for blood clots  Follow-up in 2-4 weeks

## 2017-12-27 NOTE — Progress Notes (Signed)
Thanks, [employer]

## 2017-12-27 NOTE — Progress Notes (Unsigned)
Today's lower extremity venous duplex is negative for DVT, bilaterally. Preliminary results given to Dr. Ashby Dawes at 3:48.

## 2017-12-27 NOTE — Progress Notes (Signed)
Jill Preston    [id number]    Jun 01, 1933  Primary Care Physician:Ramachandran, Mauro Kaufmann, MD  Referring Physician: Merrilee Seashore, [place], [place] [address]  Chief complaint: Consult for dyspnea  HPI: 83 year old with history of chronic heart failure, coronary artery disease, lower extremity DVT, chronic kidney disease  She had a right peroneal tendon rupture status post repair and ankle arthroscopy on 07/10/17.  She had been relatively immobile since then. Seen by cardiology on 11/14/17 for dyspnea on exertion, 8 lbs weight gain, lower extremity swelling right greater than left.  She has been diuresed aggressively with improvement in lower extremity edema.  However continues to be symptomatic with dyspnea on exertion.  BNP is low.  Hence she is referred to pulmonary for further evaluation. D dimer was sent which is mildly elevated.    History of coronary artery disease status post stenting in 2013.  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  Developed retroperitoneal hematoma on anticoagulation and had a temporary IVC filter.  She was also on Xarelto, underwent stenting of the left iliac vein.  Currently she is of anticoagulation.  She had a hypercoagulable workup at [facility] in 2015 which is negative except for positive lupus inhibitor thought to be false positive due to Xarelto.  Prothrombin gene mutation, factor V Leiden test and homocystine levels were negative.  Pets: No pets.  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  No significant travel.  Outpatient Encounter Medications as of 12/27/2017  Medication Sig  . acetaminophen (TYLENOL) 500 MG tablet Take 500-1,000 mg by mouth every 6 (six) hours as  needed (for pain.).   Marland Kitchen Apoaequorin (PREVAGEN) 10 MG CAPS Take 1 capsule by mouth daily.   Marland Kitchen aspirin EC 81 MG tablet Take 1 tablet (81 mg total) by mouth daily.  . bisacodyl (DULCOLAX) 5 MG EC tablet Take 5 mg by mouth 2 (two) times daily.  Marland Kitchen ezetimibe (ZETIA) 10 MG tablet Take 1 tablet (10 mg total) by mouth daily.  . furosemide (LASIX) 40 MG tablet Take 2 tablets (80 mg total) by mouth every morning AND 1 tablet (40 mg total) every evening.  . Homeopathic Products (LEG CRAMP RELIEF) SUBL Place 2 tablets under the tongue at bedtime as needed (cramps).  . isosorbide mononitrate (IMDUR) 30 MG 24 hr tablet TAKE 2 TABLETS BY MOUTH 2 TIMES DAILY  . levothyroxine (SYNTHROID, LEVOTHROID) 137 MCG tablet Take 137 mcg by mouth daily before breakfast.  . losartan-hydrochlorothiazide (HYZAAR) 100-25 MG tablet Take 0.5 tablets by mouth daily.  . Magnesium 250 MG TABS Take 1 tablet by mouth daily.  . metolazone (ZAROXOLYN) 5 MG tablet Take 5 mg tablet by mouth TWICE weekly  . Miconazole Nitrate 2 % POWD Apply 1 application topically 2 (two) times daily. (Patient taking differently: Apply 1 application topically 2 (two) times daily as needed (for rash around lower stomach/groin/legs). )  . Multiple Vitamin (MULTIVITAMIN WITH MINERALS) TABS tablet Take 1 tablet by mouth 2 (two) times daily. Centrum 50+   . nitroGLYCERIN (NITROSTAT) 0.4 MG SL tablet Place 1 tablet (0.4 mg total) under the tongue every 5 (five) minutes as needed for chest pain.  Marland Kitchen nystatin (MYCOSTATIN/NYSTOP) powder Apply 1 g topically daily  as needed (rash).   . potassium chloride (K-DUR) 10 MEQ tablet Take 2 tablets (20 mEq total) by mouth 2 (two) times daily.  . pravastatin (PRAVACHOL) 80 MG tablet Take 80 mg by mouth at bedtime.  . vitamin C (ASCORBIC ACID) 500 MG tablet Take 1,000-3,000 mg by mouth daily.    No facility-administered encounter medications on file as of 12/27/2017.     Allergies as of 12/27/2017 - Review Complete 12/27/2017    Allergen Reaction Noted  . Atorvastatin Anaphylaxis 07/21/2011  . Penicillins Anaphylaxis and Hives 07/21/2011  . Aminophylline Itching, Swelling, and Rash 11/21/2016  . Contrast media [iodinated diagnostic agents] Swelling and Other (See Comments) 07/21/2011  . Latex Other (See Comments) 07/21/2011  . Egg white (diagnostic) Other (See Comments) 09/05/2014  . Adhesive [tape] Rash 03/05/2017  . Betadine [povidone iodine] Rash 07/09/2017  . Ciprofloxacin Hives 07/21/2011  . Codeine Nausea And Vomiting 07/21/2011  . Gelnique [oxybutynin] Other (See Comments) 07/21/2011  . Ranexa [ranolazine] Other (See Comments) 07/08/2014  . Shellfish allergy Nausea And Vomiting 09/25/2011    Past Medical History:  Diagnosis Date  . Arthritis    right knee  . Cancer [facility]) yrs ago   skin cancer removed from face  . Chronic diastolic CHF (congestive heart failure) ([place])   . Chronic venous insufficiency    LEA VENOUS, 10/17/2011 - mild reflux in bilateral common femoral veins  . CKD (chronic kidney disease), stage III ([place])   . Coronary artery disease    a. s/p PCI/BMS to prox LAD and balloon angioplasty to mLAD with suboptimal result in 2000. b. Abnl nuc 2012, cath 09/2011 - showed that the overall territory of potential ischemia was small and attributable to a moderately diseased small second diagonal artery. Med rx.  Marland Kitchen Dyspnea    with exertion  . Headache   . Hyperlipidemia   . Hypertension   . Hypertensive heart disease   . Hypothyroidism   . Obesity   . Stroke [facility])    pt. states she had "light stroke" in sept. 1980  . Urinary incontinence     Past Surgical History:  Procedure Laterality Date  . ABDOMINAL HYSTERECTOMY  1970's   complete  . ANKLE ARTHROSCOPY Right 07/10/2017   Procedure: ANKLE ARTHROSCOPY;  Surgeon: [name], DPM;  Location: [place];  Service: Podiatry;  Laterality: Right;  . BACK SURGERY  07/26/2016   cervical neck surgery, [facility]  . BLADDER  SURGERY  2010   WITH MESH  bladder tach  . bunion removal surgery Bilateral 15 yrs ago  . CARDIAC CATHETERIZATION Left 09/25/2011   Medical management  . CARDIAC CATHETERIZATION Left 03/25/2001   Normal LV function, LAD residual narrowing of less than 10%, normal ramus intermediate, circumflex, and RCA,   . CARDIAC CATHETERIZATION  09/04/1999   LAD, 3x64mm Tetra stent resulting in a reduction of the 80% stenosis to 0% residual  . CARDIAC CATHETERIZATION N/A 01/26/2015   Procedure: Right/Left Heart Cath and Coronary Angiography;  Surgeon: Troy Sine, MD;  Location: [place] CV LAB;  Service: Cardiovascular;  Laterality: N/A;  . CARDIAC CATHETERIZATION N/A 01/27/2015   Procedure: Intravascular Pressure Wire/FFR Study;  Surgeon: Burnell Blanks, MD;  Location: [place] CV LAB;  Service: Cardiovascular;  Laterality: N/A;  . CARDIAC CATHETERIZATION N/A 01/27/2015   Procedure: Right Heart Cath;  Surgeon: Burnell Blanks, MD;  Location: [place] CV LAB;  Service: Cardiovascular;  Laterality: N/A;  . CHOLECYSTECTOMY    .  COLONOSCOPY WITH PROPOFOL N/A 03/07/2017   Procedure: COLONOSCOPY WITH PROPOFOL;  Surgeon: Juanita Craver, MD;  Location: [facility];  Service: Endoscopy;  Laterality: N/A;  . CORONARY STENT PLACEMENT     LAD x 1  . ganglion cyst removal  yrs ago   x 2  . ILIAC VEIN ANGIOPLASTY / STENTING  02/15/2015  . INTRAVASCULAR PRESSURE WIRE/FFR STUDY N/A 04/05/2017   Procedure: Intravascular Pressure Wire/FFR Study;  Surgeon: Martinique, Peter M, MD;  Location: [place] CV LAB;  Service: Cardiovascular;  Laterality: N/A;  . IVC FILTER INSERTION  2016  . IVC FILTER REMOVAL  02/15/2015   at [facility]  . LEFT HEART CATH AND CORONARY ANGIOGRAPHY N/A 04/05/2017   Procedure: Left Heart Cath and Coronary Angiography;  Surgeon: Martinique, Peter M, MD;  Location: [place] CV LAB;  Service: Cardiovascular;  Laterality: N/A;  . LEFT HEART CATHETERIZATION WITH CORONARY ANGIOGRAM N/A  09/25/2011   Procedure: LEFT HEART CATHETERIZATION WITH CORONARY ANGIOGRAM;  Surgeon: Sanda Klein, MD;  Location: [place] CATH LAB;  Service: Cardiovascular;  Laterality: N/A;  . multiple bladder surgeries to remove mesh    . ROTATOR CUFF REPAIR Right   . stent to groin  08/2014   left leg  . TENDON REPAIR Right 07/10/2017   Procedure: RIGHT PERONEAL TENDON REPAIR;  Surgeon: [name], DPM;  Location: [place];  Service: Podiatry;  Laterality: Right;    Family History  Problem Relation Age of Onset  . Cancer Mother   . Heart disease Father   . Heart disease Sister   . Heart disease Brother     Social History   Socioeconomic History  . Marital status: Married    Spouse name: Not on file  . Number of children: Not on file  . Years of education: Not on file  . Highest education level: Not on file  Occupational History  . Not on file  Social Needs  . Financial resource strain: Not on file  . Food insecurity:    Worry: Not on file    Inability: Not on file  . Transportation needs:    Medical: Not on file    Non-medical: Not on file  Tobacco Use  . Smoking status: Never Smoker  . Smokeless tobacco: Never Used  Substance and Sexual Activity  . Alcohol use: No  . Drug use: No  . Sexual activity: Not on file  Lifestyle  . Physical activity:    Days per week: Not on file    Minutes per session: Not on file  . Stress: Not on file  Relationships  . Social connections:    Talks on phone: Not on file    Gets together: Not on file    Attends religious service: Not on file    Active member of club or organization: Not on file    Attends meetings of clubs or organizations: Not on file    Relationship status: Not on file  . Intimate partner violence:    Fear of current or ex partner: Not on file    Emotionally abused: Not on file    Physically abused: Not on file    Forced sexual activity: Not on file  Other Topics Concern  . Not on file  Social History Narrative  . Not  on file    Review of systems: Review of Systems  Constitutional: Negative for fever and chills.  HENT: Negative.   Eyes: Negative for blurred vision.  Respiratory: as per HPI  Cardiovascular:  Negative for chest pain and palpitations.  Gastrointestinal: Negative for vomiting, diarrhea, blood per rectum. Genitourinary: Negative for dysuria, urgency, frequency and hematuria.  Musculoskeletal: Negative for myalgias, back pain and joint pain.  Skin: Negative for itching and rash.  Neurological: Negative for dizziness, tremors, focal weakness, seizures and loss of consciousness.  Endo/Heme/Allergies: Negative for environmental allergies.  Psychiatric/Behavioral: Negative for depression, suicidal ideas and hallucinations.  All other systems reviewed and are negative.  Physical Exam: Blood pressure 106/60, pulse 88, height 5' 5.5" (1.664 m), weight 217 lb 9.6 oz (98.7 kg), SpO2 93 %. Gen:      No acute distress HEENT:  EOMI, sclera anicteric Neck:     No masses; no thyromegaly Lungs:    Clear to auscultation bilaterally; normal respiratory effort CV:         Regular rate and rhythm; no murmurs Abd:      + bowel sounds; soft, non-tender; no palpable masses, no distension Ext:    1-2+ edema. Skin:      Warm and dry; no rash Neuro: alert and oriented x 3 Psych: normal mood and affect  Data Reviewed: PFTs 02/09/15 FVC 2.72 [103%], FEV1 2.30 [117%], F/F 85, TLC 96%, DLCO 79%, DLCO/VA 103% Normal study.  Minimal diffusion defect which corrects for alveolar volume.  Chest x-ray 01/27/15 Borderline cardiomegaly.  Bibasilar atelectasis.  I reviewed the images personally  Labs D-dimer 12/12/17-0.62 BNP 12/12/17-60  Assessment:  Assessment for dyspnea She is being diuresed by cardiology but still continues to be short of breath Suspicion for COPD is low as she is a non-smoker and previous PFTs were unremarkable Labs noted for mild elevation in d-dimer which is probably not significant.   However given her relative immobility and history of DVT we will get lower extremity Doppler and VQ scan Chest x-ray today to evaluate for lung abnormalities  Suspect that she may have deconditioning from her immobility after her foot surgery.  She will need to check with her podiatrist about exercise clearance.  Plan/Recommendations: - V/Q scan, LE duplex, CXR  Marshell Garfinkel MD [facility] 12/27/2017, 11:27 AM  CC: Merrilee Seashore, MD

## 2017-12-30 ENCOUNTER — Telehealth: Payer: Self-pay | Admitting: Cardiovascular Disease

## 2017-12-30 MED ORDER — METOLAZONE 2.5 MG PO TABS: ORAL_TABLET | ORAL | Status: DC

## 2017-12-30 NOTE — Telephone Encounter (Signed)
Returned the call to the patient. She stated that on Saturday she was sitting in her chair when she stared feeling dizzy and saw stars. Her husband took her blood pressure and it was "80 over something." After lying down for an hour, she retook her blood pressure and it was 97/52 and heart rate of 76. She stated that she slept all day on Saturday. She felt somewhat better on Sunday but was still weak. She did state that she did not eat or drink much on Friday and was active Saturday morning. Patient denies shortness of breath.  Blood pressure this morning was 129/71 and heart rate was 69. She stated that she feels much better today.   Current medications: Metolazone 5 mg on Monday and Thursday Furosemide 80 mg in the morning, 40 mg in the evening Potassium 20 meq daily, Monday and Thursdays she takes 30 meq (different then prescribed) Losartan-HCTZ 100-25: Half a tablet daily Imdur: 60 mg bid  Patient has been advised to rest today and to make sure she is eating plus staying hydrated. She will keep a log of her blood pressures and symptoms and bring this plus her medications to her next appointment on 02/14/18.  Message has been routed to the provider for any further recommendations.

## 2017-12-30 NOTE — Telephone Encounter (Signed)
Let's cut back the metolazone to 2.5 mg twice weekly

## 2017-12-30 NOTE — Telephone Encounter (Signed)
° °  Pt c/o BP issue: STAT if pt c/o blurred vision, one-sided weakness or slurred speech  1. What are your last 5 BP readings? Unknown  2. Are you having any other symptoms (ex. Dizziness, headache, blurred vision, passed out)? blurred vision seeing black and gold stars   3. What is your BP issue?  Low, pt felt weird all evening, slept all day 12/28/2017

## 2017-12-30 NOTE — Telephone Encounter (Signed)
Left message to call back  

## 2017-12-30 NOTE — Telephone Encounter (Signed)
Patient has been made aware of the recommendations and will call back if anything further is needed. Medication has been updated in Epic.

## 2018-01-10 ENCOUNTER — Ambulatory Visit: Payer: PPO | Admitting: Pulmonary Disease

## 2018-01-10 ENCOUNTER — Encounter: Payer: Self-pay | Admitting: Pulmonary Disease

## 2018-01-10 VITALS — BP 112/82 | HR 90 | Ht 65.5 in | Wt 217.0 lb

## 2018-01-10 DIAGNOSIS — R0602 Shortness of breath: Secondary | ICD-10-CM

## 2018-01-10 NOTE — Patient Instructions (Signed)
Please continue to work on exercise and getting back into shape The tests we did shows normal lungs with no evidence of clots I will see you back in 6 months.

## 2018-01-10 NOTE — Progress Notes (Signed)
Jill Preston    [id number]    July 21, 1933  Primary Care Physician:Ramachandran, Mauro Kaufmann, MD  Referring Physician: Merrilee Seashore, [place] [employer] [place], [place] [id number]  Chief complaint: Follow-up for dyspnea  HPI: 84 year old with history of chronic heart failure, coronary artery disease, lower extremity DVT, chronic kidney disease  She had a right peroneal tendon rupture status post repair and ankle arthroscopy on 07/10/17.  She had been relatively immobile since then. Seen by cardiology on 11/14/17 for dyspnea on exertion, 8 lbs weight gain, lower extremity swelling right greater than left.  She has been diuresed aggressively with improvement in lower extremity edema.  However continues to be symptomatic with dyspnea on exertion.  BNP is low.  Hence she is referred to pulmonary for further evaluation. D dimer was sent which is mildly elevated.    History of coronary artery disease status post stenting in 2013.  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  Developed retroperitoneal hematoma on anticoagulation and had a temporary IVC filter.  She was also on Xarelto, underwent stenting of the left iliac vein.  Currently she is of anticoagulation.  She had a hypercoagulable workup at [facility] in 2015 which is negative except for positive lupus inhibitor thought to be false positive due to Xarelto.  Prothrombin gene mutation, factor V Leiden test and homocystine levels were negative.  Pets: No pets.  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  No significant travel.  Interval history: She is had VQ scan, chest x-ray and lower extremity Dopplers which were unremarkable.   Continues on diuretics per cardiology.  States  that her dyspnea is slightly improved Has started to walk and is trying to exercise.  Outpatient Encounter Medications as of 01/10/2018  Medication Sig  . acetaminophen (TYLENOL) 500 MG tablet Take 500-1,000 mg by mouth every 6 (six) hours as needed (for pain.).   Marland Kitchen Apoaequorin (PREVAGEN) 10 MG CAPS Take 1 capsule by mouth daily.   Marland Kitchen aspirin EC 81 MG tablet Take 1 tablet (81 mg total) by mouth daily.  . bisacodyl (DULCOLAX) 5 MG EC tablet Take 5 mg by mouth 2 (two) times daily.  Marland Kitchen ezetimibe (ZETIA) 10 MG tablet Take 1 tablet (10 mg total) by mouth daily.  . furosemide (LASIX) 40 MG tablet Take 2 tablets (80 mg total) by mouth every morning AND 1 tablet (40 mg total) every evening.  . Homeopathic Products (LEG CRAMP RELIEF) SUBL Place 2 tablets under the tongue at bedtime as needed (cramps).  . isosorbide mononitrate (IMDUR) 30 MG 24 hr tablet TAKE 2 TABLETS BY MOUTH 2 TIMES DAILY  . levothyroxine (SYNTHROID, LEVOTHROID) 137 MCG tablet Take 137 mcg by mouth daily before breakfast.  . losartan-hydrochlorothiazide (HYZAAR) 100-25 MG tablet Take 0.5 tablets by mouth daily.  . Magnesium 250 MG TABS Take 1 tablet by mouth daily.  . metolazone (ZAROXOLYN) 2.5 MG tablet Take a 2.5 mg tablet by mouth TWICE weekly  . Miconazole Nitrate 2 % POWD Apply 1 application topically 2 (two) times daily. (Patient taking differently: Apply 1 application topically 2 (two) times daily as needed (for rash around lower stomach/groin/legs). )  . Multiple Vitamin (MULTIVITAMIN WITH MINERALS) TABS tablet Take 1 tablet by mouth 2 (  two) times daily. Centrum 50+   . nitroGLYCERIN (NITROSTAT) 0.4 MG SL tablet Place 1 tablet (0.4 mg total) under the tongue every 5 (five) minutes as needed for chest pain.  Marland Kitchen nystatin (MYCOSTATIN/NYSTOP) powder Apply 1 g topically daily as needed (rash).   . potassium chloride (K-DUR) 10 MEQ tablet Take 2 tablets (20 mEq total) by mouth 2 (two) times daily.  . pravastatin (PRAVACHOL) 80 MG tablet  Take 80 mg by mouth at bedtime.  . vitamin C (ASCORBIC ACID) 500 MG tablet Take 1,000-3,000 mg by mouth daily.    No facility-administered encounter medications on file as of 01/10/2018.     Allergies as of 01/10/2018 - Review Complete 01/10/2018  Allergen Reaction Noted  . Atorvastatin Anaphylaxis 07/21/2011  . Penicillins Anaphylaxis and Hives 07/21/2011  . Aminophylline Itching, Swelling, and Rash 11/21/2016  . Contrast media [iodinated diagnostic agents] Swelling and Other (See Comments) 07/21/2011  . Latex Other (See Comments) 07/21/2011  . Egg white (diagnostic) Other (See Comments) 09/05/2014  . Adhesive [tape] Rash 03/05/2017  . Betadine [povidone iodine] Rash 07/09/2017  . Ciprofloxacin Hives 07/21/2011  . Codeine Nausea And Vomiting 07/21/2011  . Gelnique [oxybutynin] Other (See Comments) 07/21/2011  . Ranexa [ranolazine] Other (See Comments) 07/08/2014  . Shellfish allergy Nausea And Vomiting 09/25/2011    Past Medical History:  Diagnosis Date  . Arthritis    right knee  . Cancer [facility]) yrs ago   skin cancer removed from face  . Chronic diastolic CHF (congestive heart failure) ([place])   . Chronic venous insufficiency    LEA VENOUS, 10/17/2011 - mild reflux in bilateral common femoral veins  . CKD (chronic kidney disease), stage III ([place])   . Coronary artery disease    a. s/p PCI/BMS to prox LAD and balloon angioplasty to mLAD with suboptimal result in 2000. b. Abnl nuc 2012, cath 09/2011 - showed that the overall territory of potential ischemia was small and attributable to a moderately diseased small second diagonal artery. Med rx.  Marland Kitchen Dyspnea    with exertion  . Headache   . Hyperlipidemia   . Hypertension   . Hypertensive heart disease   . Hypothyroidism   . Obesity   . Stroke [facility])    pt. states she had "light stroke" in sept. 1980  . Urinary incontinence     Past Surgical History:  Procedure Laterality Date  . ABDOMINAL HYSTERECTOMY  1970's   complete  .  ANKLE ARTHROSCOPY Right 07/10/2017   Procedure: ANKLE ARTHROSCOPY;  Surgeon: [name], DPM;  Location: [place];  Service: Podiatry;  Laterality: Right;  . BACK SURGERY  07/26/2016   cervical neck surgery, [facility]  . BLADDER SURGERY  2010   WITH MESH  bladder tach  . bunion removal surgery Bilateral 15 yrs ago  . CARDIAC CATHETERIZATION Left 09/25/2011   Medical management  . CARDIAC CATHETERIZATION Left 03/25/2001   Normal LV function, LAD residual narrowing of less than 10%, normal ramus intermediate, circumflex, and RCA,   . CARDIAC CATHETERIZATION  09/04/1999   LAD, 3x64mm Tetra stent resulting in a reduction of the 80% stenosis to 0% residual  . CARDIAC CATHETERIZATION N/A 01/26/2015   Procedure: Right/Left Heart Cath and Coronary Angiography;  Surgeon: Troy Sine, MD;  Location: [place] CV LAB;  Service: Cardiovascular;  Laterality: N/A;  . CARDIAC CATHETERIZATION N/A 01/27/2015   Procedure: Intravascular Pressure Wire/FFR Study;  Surgeon: Burnell Blanks, MD;  Location: [place] CV LAB;  Service:  Cardiovascular;  Laterality: N/A;  . CARDIAC CATHETERIZATION N/A 01/27/2015   Procedure: Right Heart Cath;  Surgeon: Burnell Blanks, MD;  Location: [place] CV LAB;  Service: Cardiovascular;  Laterality: N/A;  . CHOLECYSTECTOMY    . COLONOSCOPY WITH PROPOFOL N/A 03/07/2017   Procedure: COLONOSCOPY WITH PROPOFOL;  Surgeon: Juanita Craver, MD;  Location: [facility];  Service: Endoscopy;  Laterality: N/A;  . CORONARY STENT PLACEMENT     LAD x 1  . ganglion cyst removal  yrs ago   x 2  . ILIAC VEIN ANGIOPLASTY / STENTING  02/15/2015  . INTRAVASCULAR PRESSURE WIRE/FFR STUDY N/A 04/05/2017   Procedure: Intravascular Pressure Wire/FFR Study;  Surgeon: Martinique, Peter M, MD;  Location: [place] CV LAB;  Service: Cardiovascular;  Laterality: N/A;  . IVC FILTER INSERTION  2016  . IVC FILTER REMOVAL  02/15/2015   at [facility]  . LEFT HEART CATH AND CORONARY  ANGIOGRAPHY N/A 04/05/2017   Procedure: Left Heart Cath and Coronary Angiography;  Surgeon: Martinique, Peter M, MD;  Location: [place] CV LAB;  Service: Cardiovascular;  Laterality: N/A;  . LEFT HEART CATHETERIZATION WITH CORONARY ANGIOGRAM N/A 09/25/2011   Procedure: LEFT HEART CATHETERIZATION WITH CORONARY ANGIOGRAM;  Surgeon: Sanda Klein, MD;  Location: [place] CATH LAB;  Service: Cardiovascular;  Laterality: N/A;  . multiple bladder surgeries to remove mesh    . ROTATOR CUFF REPAIR Right   . stent to groin  08/2014   left leg  . TENDON REPAIR Right 07/10/2017   Procedure: RIGHT PERONEAL TENDON REPAIR;  Surgeon: [name], DPM;  Location: [place];  Service: Podiatry;  Laterality: Right;    Family History  Problem Relation Age of Onset  . Cancer Mother   . Heart disease Father   . Heart disease Sister   . Heart disease Brother     Social History   Socioeconomic History  . Marital status: Married    Spouse name: Not on file  . Number of children: Not on file  . Years of education: Not on file  . Highest education level: Not on file  Occupational History  . Not on file  Social Needs  . Financial resource strain: Not on file  . Food insecurity:    Worry: Not on file    Inability: Not on file  . Transportation needs:    Medical: Not on file    Non-medical: Not on file  Tobacco Use  . Smoking status: Never Smoker  . Smokeless tobacco: Never Used  Substance and Sexual Activity  . Alcohol use: No  . Drug use: No  . Sexual activity: Not on file  Lifestyle  . Physical activity:    Days per week: Not on file    Minutes per session: Not on file  . Stress: Not on file  Relationships  . Social connections:    Talks on phone: Not on file    Gets together: Not on file    Attends religious service: Not on file    Active member of club or organization: Not on file    Attends meetings of clubs or organizations: Not on file    Relationship status: Not on file  . Intimate  partner violence:    Fear of current or ex partner: Not on file    Emotionally abused: Not on file    Physically abused: Not on file    Forced sexual activity: Not on file  Other Topics Concern  . Not on file  Social History Narrative  . Not on file    Review of systems: Review of Systems  Constitutional: Negative for fever and chills.  HENT: Negative.   Eyes: Negative for blurred vision.  Respiratory: as per HPI  Cardiovascular: Negative for chest pain and palpitations.  Gastrointestinal: Negative for vomiting, diarrhea, blood per rectum. Genitourinary: Negative for dysuria, urgency, frequency and hematuria.  Musculoskeletal: Negative for myalgias, back pain and joint pain.  Skin: Negative for itching and rash.  Neurological: Negative for dizziness, tremors, focal weakness, seizures and loss of consciousness.  Endo/Heme/Allergies: Negative for environmental allergies.  Psychiatric/Behavioral: Negative for depression, suicidal ideas and hallucinations.  All other systems reviewed and are negative.  Physical Exam: Blood pressure 112/82, pulse 90, height 5' 5.5" (1.664 m), weight 217 lb (98.4 kg), SpO2 95 %. Gen:      No acute distress HEENT:  EOMI, sclera anicteric Neck:     No masses; no thyromegaly Lungs:    Clear to auscultation bilaterally; normal respiratory effort CV:         Regular rate and rhythm; no murmurs Abd:      + bowel sounds; soft, non-tender; no palpable masses, no distension Ext:    No edema; adequate peripheral perfusion Skin:      Warm and dry; no rash Neuro: alert and oriented x 3 Psych: normal mood and affect  Data Reviewed: PFTs 02/09/15 FVC 2.72 [103%], FEV1 2.30 [117%], F/F 85, TLC 96%, DLCO 79%, DLCO/VA 103% Normal study.  Minimal diffusion defect which corrects for alveolar volume.  Chest x-ray 01/27/15 Borderline cardiomegaly.  Bibasilar atelectasis.  Chest x-ray 12/27/17 Lungs are clear  VQ scan 12/27/17- Negative for pulmonary  embolism.  Lower extremity Doppler 12/27/17-no evidence of DVT  Labs D-dimer 12/12/17-0.62 BNP 12/12/17-60  Assessment:  Follow-up for dyspnea Unlikely to be COPD as she is a non-smoker and previous PFTs were unremarkable Suspect that she may have deconditioning from her immobility after her foot surgery. No evidence of lung abnormality or pulmonary embolism, DVT on work-up  She continues on diuresis per cardiology with improvement in her weight.   Encouraged her to continue on gradual increase in her exercise, work on weight loss and diet  Plan/Recommendations: - Continue to work on exercise program, weight loss and diet  Marshell Garfinkel MD [facility] 01/10/2018, 12:23 PM  CC: Merrilee Seashore, MD

## 2018-01-13 ENCOUNTER — Ambulatory Visit: Payer: PPO | Admitting: Pulmonary Disease

## 2018-01-29 ENCOUNTER — Telehealth: Payer: Self-pay | Admitting: Cardiovascular Disease

## 2018-01-29 NOTE — Telephone Encounter (Signed)
New Message   Pt c/o medication issue:  1. Name of Medication: furosemide (LASIX) 40 MG tablet  2. How are you currently taking this medication (dosage and times per day)? Take 2 tablets (80 mg total) by mouth every morning AND 1 tablet (40 mg total) every evening.  3. Are you having a reaction (difficulty breathing--STAT)? yes  4. What is your medication issue? Pt states that the medication is making her extremely tired and she wants an alternative. Please call

## 2018-01-29 NOTE — Telephone Encounter (Signed)
Patient stated that she would like to stop taking the furosemide because it is making her tired and weak. She stated that she is still having some edema bilaterally in her feet and ankles even though she is taking the furosemide and she still has shortness of breath on exertion. She has been advised to not stop taking the furosemide unless advised by the provider.   She is currenty taking: Furosemide 80 mg in the morning and 40 mg in the evening  Metolazone 2.5 mg on Mondays and Thursday  Losartan HCTZ 100-25: 0.5 mg daily  Imdur 30 mg daily  She stated that she is "fed up" with taking her blood pressure and with feeling tired and dizzy all the time. She does not want to take all of the medications anymore. She stated that she feels dizzy every time she gets up to do any activity and has to sit down. When asked if she took her blood pressure when she stands she stated that it will drop from the 120/50's to 90/60's. She gave a recent reading of 126/57 HR 69 when sitting. Standing it went to 99/62 HR 82. She stated that this has been the norm for her. The patient has a follow up on 5/31 with provider. Message has been routed to him for further recommendation.

## 2018-01-30 NOTE — Telephone Encounter (Signed)
Please stop taking the metolazone.  Continue the furosemide for now, let us give it a week or 2 to see how she feels.

## 2018-01-31 NOTE — Telephone Encounter (Signed)
Patient made aware and verbalized her understanding. 

## 2018-01-31 NOTE — Telephone Encounter (Signed)
Left a message to call back.

## 2018-02-14 ENCOUNTER — Encounter: Payer: Self-pay | Admitting: Cardiovascular Disease

## 2018-02-14 ENCOUNTER — Ambulatory Visit: Payer: PPO | Admitting: Cardiovascular Disease

## 2018-02-14 VITALS — BP 118/60 | HR 63 | Ht 65.5 in | Wt 213.0 lb

## 2018-02-14 DIAGNOSIS — I2510 Atherosclerotic heart disease of native coronary artery without angina pectoris: Secondary | ICD-10-CM

## 2018-02-14 DIAGNOSIS — N183 Chronic kidney disease, stage 3 unspecified: Secondary | ICD-10-CM

## 2018-02-14 DIAGNOSIS — E782 Mixed hyperlipidemia: Secondary | ICD-10-CM | POA: Diagnosis not present

## 2018-02-14 DIAGNOSIS — I10 Essential (primary) hypertension: Secondary | ICD-10-CM

## 2018-02-14 DIAGNOSIS — I771 Stricture of artery: Secondary | ICD-10-CM

## 2018-02-14 DIAGNOSIS — I5032 Chronic diastolic (congestive) heart failure: Secondary | ICD-10-CM

## 2018-02-14 MED ORDER — FUROSEMIDE 80 MG PO TABS: 80.0000 mg | ORAL_TABLET | Freq: Every day | ORAL | 3 refills | Status: DC

## 2018-02-14 MED ORDER — POTASSIUM CHLORIDE ER 20 MEQ PO TBCR: 40.0000 meq | EXTENDED_RELEASE_TABLET | Freq: Every day | ORAL | 3 refills | Status: DC

## 2018-02-14 NOTE — Patient Instructions (Signed)
Medication Instructions: Dr Sallyanne Kuster has recommended making the following medication changes: 1. DECREASE Furosemide to 80 mg ONCE DAILY 2. DECREASE Potassium to 40 mg ONCE DAILY  Labwork: Your physician recommends that you return for lab work in 3 months.  Testing/Procedures: NONE ORDERED  Follow-up: Your physician recommends that you schedule a follow-up appointment in 3 months with Rosaria Ferries, PA.  Dr Sallyanne Kuster recommends that you schedule a follow-up appointment in 6 months. You will receive a reminder letter in the mail two months in advance. If you don't receive a letter, please call our office to schedule the follow-up appointment.  If you need a refill on your cardiac medications before your next appointment, please call your pharmacy.

## 2018-02-14 NOTE — Progress Notes (Signed)
Patient ID: Jill Preston, female   DOB: 1932/10/29, 84 y.o.   MRN: [id number] Patient ID: Jill Preston, female   DOB: 12-21-1932, 84 y.o.   MRN: [id number]    Cardiology Office Note    Date:  02/16/2018   ID:  Jill Preston, DOB Feb 10, 1933, MRN [id number]  PCP:  Merrilee Seashore, MD  Cardiologist:   Sanda Klein, MD   chief complaint: Fatigue   History of Present Illness:  Jill Preston is a 84 y.o. female returning in follow-up For CAD and CHF.  She feels better since her last appointment and has not had angina.  She continues to have 1-2+ ankle edema that is always more prominent on the right where she has had a previous injury.  We have the legs and fatigue bother her more than shortness of breath.  Following a respiratory infection in February she has had a persistent dry cough.  She saw Dr. Vaughan Browner in the pulmonary clinic and underwent a VQ scan with normal findings.  Her home scale shows a weight of 210 pounds, 3 pounds higher in our office.  Attempts at diuresis down to 200 pounds were not well-tolerated.  She has also stopped taking magnesium since this because of diarrhea.  She denies orthopnea, PND, palpitations, syncope.  She can tell when her potassium is low since she develops muscle cramps in her fingers.  She has a long history of coronary disease. She underwent proximal LAD bare-metal stenting and balloon angioplasty of the mid LAD in 2013. She has had persistent ischemia in the territory of the diagonal artery on nuclear stress test performed over the last few years. Coronary angiography performed May 2016 showed a widely patent LAD stent, 60% ostial circumflex stenosis, 20-30% right coronary artery stenosis. There was concern about possible left coronary artery stenosis but fractional flow reserve was normal (0.96 at baseline, 0.91 during intravenous adenosine infusion). She had good anginal response to Ranexa but developed severe constipation and could not  tolerate the medication. She has been intolerant to beta blockers due to bradycardia. She has preserved left ventricular systolic function but has had episodes of acute exacerbation of diastolic heart failure attributed to hypertensive heart disease. December 2015, she was critically ill with an acute left iliofemoral DVT with anticoagulation complicated by retroperitoneal hematoma and hemorrhagic shock, requiring placement of an inferior vena cava filter. The filter was removed in June 2016. Coronary angiography in July 2018 showed unchanged anatomy of the eccentric stenosis in the left main coronary artery with noncritical FFR (0.89 in the LAD, 0.86 in the LCx).  Intravascular Pressure Wire/FFR Study  Left Heart Cath and Coronary Angiography  Conclusion     LM lesion, 70 %stenosed.  Mid LAD lesion, 0 %stenosed at site of prior stent.  Ost Cx lesion, 60 %stenosed.  Prox RCA-1 lesion, 20 %stenosed.  Prox RCA-2 lesion, 30 %stenosed.  The left ventricular systolic function is normal.  LV end diastolic pressure is normal.  The left ventricular ejection fraction is 55-65% by visual estimate.   1. 70% eccentric distal left main stenosis. Angiographically similar to 2016. FFR 0.89 into the LAD and 0.86 into the LCx suggesting that this lesion is not flow limiting.  2. Otherwise nonobstructive CAD. Patent stent in LAD. 3. Normal LV function 4. Normal LVEDP  Plan: recommend continued medical therapy.     Past Medical History:  Diagnosis Date  . Arthritis    right knee  . Cancer ([place]) yrs ago  skin cancer removed from face  . Chronic diastolic CHF (congestive heart failure) ([place])   . Chronic venous insufficiency    LEA VENOUS, 10/17/2011 - mild reflux in bilateral common femoral veins  . CKD (chronic kidney disease), stage III ([place])   . Coronary artery disease    a. s/p PCI/BMS to prox LAD and balloon angioplasty to mLAD with suboptimal result in 2000. b. Abnl nuc 2012, cath  09/2011 - showed that the overall territory of potential ischemia was small and attributable to a moderately diseased small second diagonal artery. Med rx.  Marland Kitchen Dyspnea    with exertion  . Headache   . Hyperlipidemia   . Hypertension   . Hypertensive heart disease   . Hypothyroidism   . Obesity   . Stroke [facility])    pt. states she had "light stroke" in sept. 1980  . Urinary incontinence     Past Surgical History:  Procedure Laterality Date  . ABDOMINAL HYSTERECTOMY  1970's   complete  . ANKLE ARTHROSCOPY Right 07/10/2017   Procedure: ANKLE ARTHROSCOPY;  Surgeon: [name], DPM;  Location: [place];  Service: Podiatry;  Laterality: Right;  . BACK SURGERY  07/26/2016   cervical neck surgery, [facility]  . BLADDER SURGERY  2010   WITH MESH  bladder tach  . bunion removal surgery Bilateral 15 yrs ago  . CARDIAC CATHETERIZATION Left 09/25/2011   Medical management  . CARDIAC CATHETERIZATION Left 03/25/2001   Normal LV function, LAD residual narrowing of less than 10%, normal ramus intermediate, circumflex, and RCA,   . CARDIAC CATHETERIZATION  09/04/1999   LAD, 3x75mm Tetra stent resulting in a reduction of the 80% stenosis to 0% residual  . CARDIAC CATHETERIZATION N/A 01/26/2015   Procedure: Right/Left Heart Cath and Coronary Angiography;  Surgeon: Troy Sine, MD;  Location: [place] [facility];  Service: Cardiovascular;  Laterality: N/A;  . CARDIAC CATHETERIZATION N/A 01/27/2015   Procedure: Intravascular Pressure Wire/FFR Study;  Surgeon: Burnell Blanks, MD;  Location: [place] [facility];  Service: Cardiovascular;  Laterality: N/A;  . CARDIAC CATHETERIZATION N/A 01/27/2015   Procedure: Right Heart Cath;  Surgeon: Burnell Blanks, MD;  Location: [place] [facility];  Service: Cardiovascular;  Laterality: N/A;  . CHOLECYSTECTOMY    . COLONOSCOPY WITH PROPOFOL N/A 03/07/2017   Procedure: COLONOSCOPY WITH PROPOFOL;  Surgeon: Juanita Craver, MD;  Location: [facility]  ENDOSCOPY;  Service: Endoscopy;  Laterality: N/A;  . CORONARY STENT PLACEMENT     LAD x 1  . ganglion cyst removal  yrs ago   x 2  . ILIAC VEIN ANGIOPLASTY / STENTING  02/15/2015  . INTRAVASCULAR PRESSURE WIRE/FFR STUDY N/A 04/05/2017   Procedure: Intravascular Pressure Wire/FFR Study;  Surgeon: Martinique, Peter M, MD;  Location: [place] [facility];  Service: Cardiovascular;  Laterality: N/A;  . IVC FILTER INSERTION  2016  . IVC FILTER REMOVAL  02/15/2015   at [facility]  . LEFT HEART CATH AND CORONARY ANGIOGRAPHY N/A 04/05/2017   Procedure: Left Heart Cath and Coronary Angiography;  Surgeon: Martinique, Peter M, MD;  Location: [place] [facility];  Service: Cardiovascular;  Laterality: N/A;  . LEFT HEART CATHETERIZATION WITH CORONARY ANGIOGRAM N/A 09/25/2011   Procedure: LEFT HEART CATHETERIZATION WITH CORONARY ANGIOGRAM;  Surgeon: Sanda Klein, MD;  Location: [place] CATH LAB;  Service: Cardiovascular;  Laterality: N/A;  . multiple bladder surgeries to remove mesh    . ROTATOR CUFF REPAIR Right   . stent to groin  08/2014   left leg  . TENDON REPAIR Right 07/10/2017   Procedure: RIGHT PERONEAL TENDON REPAIR;  Surgeon: [name], DPM;  Location: [place];  Service: Podiatry;  Laterality: Right;    Outpatient Medications Prior to Visit  Medication Sig Dispense Refill  . acetaminophen (TYLENOL) 500 MG tablet Take 500-1,000 mg by mouth every 6 (six) hours as needed (for pain.).     Marland Kitchen Apoaequorin (PREVAGEN) 10 MG CAPS Take 1 capsule by mouth daily.     Marland Kitchen aspirin EC 81 MG tablet Take 1 tablet (81 mg total) by mouth daily. 90 tablet 3  . bisacodyl (DULCOLAX) 5 MG EC tablet Take 5 mg by mouth 2 (two) times daily.    Marland Kitchen ezetimibe (ZETIA) 10 MG tablet Take 1 tablet (10 mg total) by mouth daily. 30 tablet 5  . Homeopathic Products (LEG CRAMP RELIEF) SUBL Place 2 tablets under the tongue at bedtime as needed (cramps).    . isosorbide mononitrate (IMDUR) 30 MG 24 hr tablet TAKE 2 TABLETS BY MOUTH 2 TIMES DAILY 120  tablet 11  . levothyroxine (SYNTHROID, LEVOTHROID) 137 MCG tablet Take 137 mcg by mouth daily before breakfast.    . losartan-hydrochlorothiazide (HYZAAR) 100-25 MG tablet Take 0.5 tablets by mouth daily.  0  . Magnesium 250 MG TABS Take 1 tablet by mouth daily.    . Miconazole Nitrate 2 % POWD Apply 1 application topically 2 (two) times daily. (Patient taking differently: Apply 1 application topically 2 (two) times daily as needed (for rash around lower stomach/groin/legs). ) 5 g 0  . Multiple Vitamin (MULTIVITAMIN WITH MINERALS) TABS tablet Take 1 tablet by mouth 2 (two) times daily. Centrum 50+     . nitroGLYCERIN (NITROSTAT) 0.4 MG SL tablet Place 1 tablet (0.4 mg total) under the tongue every 5 (five) minutes as needed for chest pain. 25 tablet 2  . nystatin (MYCOSTATIN/NYSTOP) powder Apply 1 g topically daily as needed (rash).     . pravastatin (PRAVACHOL) 80 MG tablet Take 80 mg by mouth at bedtime.    . vitamin C (ASCORBIC ACID) 500 MG tablet Take 1,000-3,000 mg by mouth daily.     . furosemide (LASIX) 40 MG tablet Take 2 tablets (80 mg total) by mouth every morning AND 1 tablet (40 mg total) every evening. 270 tablet 3  . potassium chloride (K-DUR) 10 MEQ tablet Take 2 tablets (20 mEq total) by mouth 2 (two) times daily. 180 tablet 3   No facility-administered medications prior to visit.      Allergies:   Atorvastatin; Penicillins; Aminophylline; Contrast media [iodinated diagnostic agents]; Latex; Egg white (diagnostic); Adhesive [tape]; Betadine [povidone iodine]; Ciprofloxacin; Codeine; Gelnique [oxybutynin]; Ranexa [ranolazine]; and Shellfish allergy   Social History   Socioeconomic History  . Marital status: Married    Spouse name: Not on file  . Number of children: Not on file  . Years of education: Not on file  . Highest education level: Not on file  Occupational History  . Not on file  Social Needs  . Financial resource strain: Not on file  . Food insecurity:    Worry:  Not on file    Inability: Not on file  . Transportation needs:    Medical: Not on file    Non-medical: Not on file  Tobacco Use  . Smoking status: Never Smoker  . Smokeless tobacco: Never Used  Substance and Sexual Activity  . Alcohol use: No  . Drug use: No  . Sexual activity:  Not on file  Lifestyle  . Physical activity:    Days per week: Not on file    Minutes per session: Not on file  . Stress: Not on file  Relationships  . Social connections:    Talks on phone: Not on file    Gets together: Not on file    Attends religious service: Not on file    Active member of club or organization: Not on file    Attends meetings of clubs or organizations: Not on file    Relationship status: Not on file  Other Topics Concern  . Not on file  Social History Narrative  . Not on file     Family History:  The patient's family history includes Cancer in her mother; Heart disease in her brother, father, and sister.   ROS:   Please see the history of present illness.    ROS All other systems reviewed and are negative.   PHYSICAL EXAM:   VS:  BP 118/60 (BP Location: Left Arm, Patient Position: Sitting, Cuff Size: Large)   Pulse 63   Ht 5' 5.5" (1.664 m)   Wt 213 lb (96.6 kg)   BMI 34.91 kg/m     General: Alert, oriented x3, no distress, morbidly obese Head: no evidence of trauma, PERRL, EOMI, no exophtalmos or lid lag, no myxedema, no xanthelasma; normal ears, nose and oropharynx Neck: normal jugular venous pulsations and no hepatojugular reflux; brisk carotid pulses without delay and no carotid bruits Chest: clear to auscultation, no signs of consolidation by percussion or palpation, normal fremitus, symmetrical and full respiratory excursions Cardiovascular: normal position and quality of the apical impulse, regular rhythm, normal first and second heart sounds, no murmurs, rubs or gallops Abdomen: no tenderness or distention, no masses by palpation, no abnormal pulsatility or  arterial bruits, normal bowel sounds, no hepatosplenomegaly Extremities: no clubbing, cyanosis; 2+ right ankle edema, 1+ left ankle edema;; 2+ radial, ulnar and brachial pulses bilaterally; 2+ right femoral, posterior tibial and dorsalis pedis pulses; 2+ left femoral, posterior tibial and dorsalis pedis pulses; no subclavian or femoral bruits Neurological: grossly nonfocal Psych: Normal mood and affect   Wt Readings from Last 3 Encounters:  02/14/18 213 lb (96.6 kg)  01/10/18 217 lb (98.4 kg)  12/27/17 217 lb 9.6 oz (98.7 kg)      Studies/Labs Reviewed:   EKG:  EKG is ordered today.  It shows normal sinus rhythm, early RS transition in lead V2, nonspecific T wave changes borderline prolonged QTC 468 ms  Recent Labs: 11/18/2017: NT-Pro BNP 101 12/12/2017: BNP 59.8; BUN 48; Creatinine, Ser 1.20; Hemoglobin 16.4; Platelets 214; Potassium 3.1; Sodium 139; TSH 0.266   Lipid Panel    Component Value Date/Time   CHOL 193 11/18/2017 0857   TRIG 212 (H) 11/18/2017 0857   HDL 47 11/18/2017 0857   CHOLHDL 4.1 11/18/2017 0857   CHOLHDL 4.2 06/27/2015 1336   VLDL 48 (H) 06/27/2015 1336   LDLCALC 104 (H) 11/18/2017 0857   Labs 09/04/2016 total cholesterol 169, triglycerides 160, HDL 40, LDL 97, hemoglobin A1c 6.1%, glucose 104, creatinine 1.16, BUN 19, TSH 0.40  CATH 04/05/2017   LM lesion, 70 %stenosed.  Mid LAD lesion, 0 %stenosed at site of prior stent.  Ost Cx lesion, 60 %stenosed.  Prox RCA-1 lesion, 20 %stenosed.  Prox RCA-2 lesion, 30 %stenosed.  The left ventricular systolic function is normal.  LV end diastolic pressure is normal.  The left ventricular ejection fraction is 55-65% by visual estimate.  1. 70% eccentric distal left main stenosis. Angiographically similar to 2016. FFR 0.89 into the LAD and 0.86 into the LCx suggesting that this lesion is not flow limiting.  2. Otherwise nonobstructive CAD. Patent stent in LAD. 3. Normal LV function 4. Normal  LVEDP  Plan: recommend continued medical therapy.   ASSESSMENT:    1. Chronic diastolic heart failure ([place])   2. Coronary artery disease involving native coronary artery of native heart without angina pectoris   3. Essential hypertension   4. Stenosis of left subclavian artery (H[facility])   5. Mixed hyperlipidemia   6. CKD (chronic kidney disease) stage 3, GFR 30-59 ml/min ([facility])      PLAN:  In order of problems listed above:  1. CHF: By her home scale she is at the target "dry weight" of 210 pounds.  BMP in March was very low at 18.8.Marland Kitchen  Attempts to diurese more aggressively did not really help her shortness of breath and more associated with muscle cramps.  Her breathing is better, although I am not entirely sure why.  Pulmonary work-up has been unremarkable.  She has a narrow margin of compensation between heart failure and kidney failure.  A large part of her dyspnea is attributable to obesity and deconditioning.  We will cut back on her diuretic and her potassium supplement.  Asked her to call if her weight increases over 215 pounds. 2. CAD: No angina at rest or with activity.  She is known to have a moderate stenosis in the left main coronary artery that was not significant by pressure wire analysis at cardiac catheterization performed a in July 2018. 3. HTN: Blood pressure should always be checked in her right arm since she appears to have left subclavian artery stenosis.  Good blood pressure control today 4. PAD: Asymptomatic.  Does not have symptoms of left subclavian steal syndrome or left arm claudication 5. Hyperlipidemia: On maximum dose of pravastatin.  Not at Target LDL under 70.  Mildly elevated triglycerides do not justify fiber therapy, especially in a patient with renal insufficiency. 6. CKD: Baseline creatinine seems to be around 1.2.   Medication Adjustments/Labs and Tests Ordered: Current medicines are reviewed at length with the patient today.  Concerns regarding  medicines are outlined above.  Medication changes, Labs and Tests ordered today are listed in the Patient Instructions below. Patient Instructions  Medication Instructions: Dr Sallyanne Kuster has recommended making the following medication changes: 1. DECREASE Furosemide to 80 mg ONCE DAILY 2. DECREASE Potassium to 40 mg ONCE DAILY  Labwork: Your physician recommends that you return for lab work in 3 months.  Testing/Procedures: NONE ORDERED  Follow-up: Your physician recommends that you schedule a follow-up appointment in 3 months with Rosaria Ferries, PA.  Dr Sallyanne Kuster recommends that you schedule a follow-up appointment in 6 months. You will receive a reminder letter in the mail two months in advance. If you don't receive a letter, please call our office to schedule the follow-up appointment.  If you need a refill on your cardiac medications before your next appointment, please call your pharmacy.      Signed, Sanda Klein, MD  02/16/2018 3:40 PM    [facility] [address], [place], [place]  [address] Phone: [phone]; Fax: [phone]

## 2018-02-16 ENCOUNTER — Encounter: Payer: Self-pay | Admitting: Cardiovascular Disease

## 2018-02-24 DIAGNOSIS — I2510 Atherosclerotic heart disease of native coronary artery without angina pectoris: Secondary | ICD-10-CM | POA: Diagnosis not present

## 2018-02-24 DIAGNOSIS — N183 Chronic kidney disease, stage 3 (moderate): Secondary | ICD-10-CM | POA: Diagnosis not present

## 2018-02-24 DIAGNOSIS — E782 Mixed hyperlipidemia: Secondary | ICD-10-CM | POA: Diagnosis not present

## 2018-02-24 DIAGNOSIS — E039 Hypothyroidism, unspecified: Secondary | ICD-10-CM | POA: Diagnosis not present

## 2018-02-24 DIAGNOSIS — I5032 Chronic diastolic (congestive) heart failure: Secondary | ICD-10-CM | POA: Diagnosis not present

## 2018-02-28 ENCOUNTER — Other Ambulatory Visit: Payer: Self-pay | Admitting: Cardiology

## 2018-03-06 DIAGNOSIS — M79606 Pain in leg, unspecified: Secondary | ICD-10-CM | POA: Diagnosis not present

## 2018-03-06 DIAGNOSIS — E039 Hypothyroidism, unspecified: Secondary | ICD-10-CM | POA: Diagnosis not present

## 2018-03-06 DIAGNOSIS — I5032 Chronic diastolic (congestive) heart failure: Secondary | ICD-10-CM | POA: Diagnosis not present

## 2018-03-06 DIAGNOSIS — E782 Mixed hyperlipidemia: Secondary | ICD-10-CM | POA: Diagnosis not present

## 2018-03-06 DIAGNOSIS — I10 Essential (primary) hypertension: Secondary | ICD-10-CM | POA: Diagnosis not present

## 2018-03-06 DIAGNOSIS — I2510 Atherosclerotic heart disease of native coronary artery without angina pectoris: Secondary | ICD-10-CM | POA: Diagnosis not present

## 2018-03-11 ENCOUNTER — Telehealth: Payer: Self-pay | Admitting: Cardiovascular Disease

## 2018-03-11 NOTE — Telephone Encounter (Signed)
Patient  understands to take extra  Lasix 80 mg if weight is over 214 lbs upon daily weight check

## 2018-03-11 NOTE — Telephone Encounter (Signed)
I spoke with patient, she states her weight last night was 215 lbs, so, she took an extra 80 mg Lasix at night .She reports she was up all night urinating and her wight this am was 211 lbs. I encouraged her to weigh every 24 hrs, in the am after emptying bladder and before she gets dressed.Then record weight. She feels she needs to weigh at night also and then take more lasix.

## 2018-03-11 NOTE — Telephone Encounter (Signed)
New Message    Patient is calling because she was advised that whenever she reaches 215lbs to call. On Saturday weight 209, Sunday 211, Monday 216 and this morning 211. She says her entire body is swelling. She says when she moves she has SOB.   Pt c/o swelling: STAT is pt has developed SOB within 24 hours  1) How much weight have you gained and in what time span? 5LBS  2) If swelling, where is the swelling located? All over  3) Are you currently taking a fluid pill? yes  4) Are you currently SOB? Not on the call just only when she moves around  5) Do you have a log of your daily weights (if so, list)? 06/22 209, 06/23 211, 06/24 216, 06/25 211  6) Have you gained 3 pounds in a day or 5 pounds in a week? 5lbs in a day  7) Have you traveled recently? no

## 2018-03-11 NOTE — Telephone Encounter (Signed)
I would go ahead and take an extra 80 mg furosemide on days when weight is 214 lb or greater.

## 2018-03-13 DIAGNOSIS — Z803 Family history of malignant neoplasm of breast: Secondary | ICD-10-CM | POA: Diagnosis not present

## 2018-03-13 DIAGNOSIS — Z1231 Encounter for screening mammogram for malignant neoplasm of breast: Secondary | ICD-10-CM | POA: Diagnosis not present

## 2018-03-14 ENCOUNTER — Telehealth: Payer: Self-pay | Admitting: Cardiovascular Disease

## 2018-03-14 MED ORDER — LOSARTAN POTASSIUM-HCTZ 100-25 MG PO TABS: 0.5000 | ORAL_TABLET | Freq: Every day | ORAL | 1 refills | Status: DC

## 2018-03-14 NOTE — Telephone Encounter (Signed)
New message   .Pt c/o BP issue: STAT if pt c/o blurred vision, one-sided weakness or slurred speech  1. What are your last 5 BP readings? 165/90 HR 65  2. Are you having any other symptoms (ex. Dizziness, headache, blurred vision, passed out)? Tired, weak  3. What is your BP issue? Patient concerned BP too high

## 2018-03-14 NOTE — Telephone Encounter (Signed)
Spoke with patient of Dr. Loletha Grayer who reports 6lbs weight gain since last Saturday. She reports swelling all over and shortness of breath with any amount of exertion. She spoke with triage nurse on 6/25 and was advised to take extra lasix 80mg  on days weight is 214lbs or greater. She was 215.8lbs this AM. Advised she should take extra lasix as directed by MD. Asked that patient notify our office if she is having to take extra lasix routinely, as in multiple days in a row or per week, d/t to weight gain as medications may need to be permanantly adjusted or she may need to be seen. She voiced understanding. Routed to MD as Juluis Rainier

## 2018-03-14 NOTE — Telephone Encounter (Signed)
Thank you again [facility]

## 2018-03-14 NOTE — Telephone Encounter (Signed)
New Message:       Pt c/o swelling: STAT is pt has developed SOB within 24 hours  1) How much weight have you gained and in what time span? 6 lbs since saturday  2) If swelling, where is the swelling located? All over but legs are worst  3) Are you currently taking a fluid pill? yes  4) Are you currently SOB? If she is active she states  5) Do you have a log of your daily weights (if so, list)? NO  6) Have you gained 3 pounds in a day or 5 pounds in a week? 6  7) Have you traveled recently? No

## 2018-03-14 NOTE — Telephone Encounter (Signed)
Returned call to patient of Dr. Loletha Grayer - she reports elevated BP of 165/90 and HR 65 this afternoon. She has been lying down all day keeping her legs elevated. She states she didn't feel just right so she checked her BP. She states she has been peeing all day as she took extra lasix today as previously advised per MD note for weight 214lbs or greater (spoke with her this AM for swelling triage concern). She has been out of hyzaar for 1 week. Refilled this medication for her. Advised to resume BP med and track BP twice daily. Advised her to call back if her BP remains elevated after resuming her hyzaar

## 2018-03-18 ENCOUNTER — Telehealth: Payer: Self-pay | Admitting: Cardiovascular Disease

## 2018-03-18 NOTE — Telephone Encounter (Signed)
New message   Patient calling to report BP per Dr Sallyanne Kuster request  1. What are your last 5 BP readings?  156/85/69 173/91/66 158/84/81 142/79/91 161/81/59 165/98/65   2. Are you having any other symptoms (ex. Dizziness, headache, blurred vision, passed out)? Tired, weak  3. What is your BP issue? Feels BP not controlled

## 2018-03-18 NOTE — Telephone Encounter (Signed)
Returned call to patient. She resumed her losartan-hctz HALF tab on Friday 6/28 and has been tracking her BP/HR since. She would like MD to review and determine if she needs to make med changes.   Routed to Dr. Loletha Grayer

## 2018-03-19 MED ORDER — LOSARTAN POTASSIUM-HCTZ 100-12.5 MG PO TABS: 1.0000 | ORAL_TABLET | Freq: Every day | ORAL | 1 refills | Status: DC

## 2018-03-19 NOTE — Telephone Encounter (Signed)
Will forward to Dr Sallyanne Kuster for review

## 2018-03-19 NOTE — Telephone Encounter (Signed)
Follow up    Patient is calling back to obtain information as to what Dr.Croitoru is suggesting. Please call.

## 2018-03-19 NOTE — Telephone Encounter (Signed)
Unfortunately, I think we need to go middle of the road with losartan 100 mg and hydrochlorothiazide 12.5 mg once daily.  Her current fixed combination losartan-HCTZ makes it difficult.  She will need 2 separate prescriptions for the 2 ingredients she will need a new prescription Thanks MCr

## 2018-03-19 NOTE — Telephone Encounter (Signed)
Advised patient, verbalized understanding. Combo available in this dose so

## 2018-03-25 ENCOUNTER — Telehealth: Payer: Self-pay | Admitting: Cardiovascular Disease

## 2018-03-25 NOTE — Telephone Encounter (Signed)
New message         Are you dizzy now? NO 1) Do you feel faint or have you passed out? NO  Do you have any other symptoms? Patient states she had arm, neck, back pain, dizziness and weakness.No chest pain 2) Patient took 2 Nitro  3) Have you checked your HR and BP (record if available)? 99/60/67, 111/55/63

## 2018-03-25 NOTE — Telephone Encounter (Signed)
Called patient stated that she was feeling very fatigued at the moment. She stated around 2:30 this afternoon she began to 'feel funny' and she had very blurry vision, left arm pain radiating to back and under arm, she sat down and began to check her BP it was 99/60 HR 67, she took 2 nitro at the same time, waited 30 minutes rechecked blood pressure it was 111/55 HR 63. She continues to feel fatigued and has no energy. She rechecked BP on the phone with me which was 150/71 HR 64. Advised her to make appointment, Eulas Post PA had appointment for tomorrow, she verbalized she could come to that appointment to be checked and if any chest pain began or left arm pain came back to go to the emergency room. Also advised patient to not take 2 nitro's back to back, to wait 5 minutes in between taking them. Patient verbalized understanding and will be here for appointment tomorrow.

## 2018-03-25 NOTE — Telephone Encounter (Signed)
Agree, thanks MCr 

## 2018-03-26 ENCOUNTER — Ambulatory Visit (INDEPENDENT_AMBULATORY_CARE_PROVIDER_SITE_OTHER): Payer: PPO | Admitting: Physician Assistant

## 2018-03-26 ENCOUNTER — Encounter: Payer: Self-pay | Admitting: Physician Assistant

## 2018-03-26 VITALS — BP 120/82 | HR 59 | Ht 65.5 in | Wt 211.0 lb

## 2018-03-26 DIAGNOSIS — N183 Chronic kidney disease, stage 3 unspecified: Secondary | ICD-10-CM

## 2018-03-26 DIAGNOSIS — E039 Hypothyroidism, unspecified: Secondary | ICD-10-CM | POA: Diagnosis not present

## 2018-03-26 DIAGNOSIS — R531 Weakness: Secondary | ICD-10-CM

## 2018-03-26 DIAGNOSIS — I10 Essential (primary) hypertension: Secondary | ICD-10-CM | POA: Diagnosis not present

## 2018-03-26 DIAGNOSIS — I5032 Chronic diastolic (congestive) heart failure: Secondary | ICD-10-CM | POA: Diagnosis not present

## 2018-03-26 DIAGNOSIS — R42 Dizziness and giddiness: Secondary | ICD-10-CM

## 2018-03-26 DIAGNOSIS — Z8673 Personal history of transient ischemic attack (TIA), and cerebral infarction without residual deficits: Secondary | ICD-10-CM | POA: Diagnosis not present

## 2018-03-26 DIAGNOSIS — R5383 Other fatigue: Secondary | ICD-10-CM

## 2018-03-26 DIAGNOSIS — I2510 Atherosclerotic heart disease of native coronary artery without angina pectoris: Secondary | ICD-10-CM | POA: Diagnosis not present

## 2018-03-26 DIAGNOSIS — E785 Hyperlipidemia, unspecified: Secondary | ICD-10-CM | POA: Diagnosis not present

## 2018-03-26 LAB — COMPREHENSIVE METABOLIC PANEL
ALT: 27 IU/L (ref 0–32)
AST: 41 IU/L — ABNORMAL HIGH (ref 0–40)
Albumin/Globulin Ratio: 1.9 (ref 1.2–2.2)
Albumin: 4.1 g/dL (ref 3.5–4.7)
Alkaline Phosphatase: 82 IU/L (ref 39–117)
BUN/Creatinine Ratio: 21 (ref 12–28)
BUN: 19 mg/dL (ref 8–27)
Bilirubin Total: 0.6 mg/dL (ref 0.0–1.2)
CO2: 29 mmol/L (ref 20–29)
Calcium: 10 mg/dL (ref 8.7–10.3)
Chloride: 95 mmol/L — ABNORMAL LOW (ref 96–106)
Creatinine, Ser: 0.9 mg/dL (ref 0.57–1.00)
GFR calc Af Amer: 69 mL/min/{1.73_m2} (ref >59)
GFR calc non Af Amer: 60 mL/min/{1.73_m2} (ref >59)
Globulin, Total: 2.2 g/dL (ref 1.5–4.5)
Glucose: 96 mg/dL (ref 65–99)
Potassium: 4 mmol/L (ref 3.5–5.2)
Sodium: 141 mmol/L (ref 134–144)
Total Protein: 6.3 g/dL (ref 6.0–8.5)

## 2018-03-26 LAB — T3: T3, Total: 133 ng/dL (ref 71–180)

## 2018-03-26 LAB — CBC
Hematocrit: 46.5 % (ref 34.0–46.6)
Hemoglobin: 15.5 g/dL (ref 11.1–15.9)
MCH: 32.2 pg (ref 26.6–33.0)
MCHC: 33.3 g/dL (ref 31.5–35.7)
MCV: 97 fL (ref 79–97)
Platelets: 222 10*3/uL (ref 150–450)
RBC: 4.82 x10E6/uL (ref 3.77–5.28)
RDW: 13 % (ref 12.3–15.4)
WBC: 6.6 10*3/uL (ref 3.4–10.8)

## 2018-03-26 LAB — T4, FREE: Free T4: 1.7 ng/dL (ref 0.82–1.77)

## 2018-03-26 NOTE — Patient Instructions (Signed)
Medication Instructions:  Your physician recommends that you continue on your current medications as directed. Please refer to the Current Medication list given to you today.  Labwork: Your physician recommends that you return for lab work in: TODAY-CMET, CBC, FREE T4, T3,   Testing/Procedures: None  Follow-Up: Your physician recommends that you schedule a follow-up appointment in: 3 months with Dr Sallyanne Kuster.  Any Other Special Instructions Will Be Listed Below (If Applicable). 1. RECORD your blood pressures daily for 1 week send them to the office to Dr Sallyanne Kuster or Almyra Deforest, PA-C 2. Bring your automated blood pressure cuff to your next visit.  If you need a refill on your cardiac medications before your next appointment, please call your pharmacy.

## 2018-03-26 NOTE — Progress Notes (Signed)
Cardiology Office Note    Date:  03/28/2018   ID:  ZILDA NO, DOB 25-Jan-1933, MRN [id number]  PCP:  Merrilee Seashore, MD  Cardiologist:  Dr. Sallyanne Kuster   Chief Complaint  Patient presents with  . Follow-up    Left arm pain/Dizziness/vision blurry    History of Present Illness:  Jill Preston is a 84 y.o. female with PMH of CAD (BMS to mLAD 2013), chronic diastolic HF, chronic LE edema R>L, HTN, HLD, hypothyroidism, CKD stage III, and CVA.  Patient had another cardiac catheterization in May 2016 which showed widely patent LAD stent, 60% ostial left circumflex artery stenosis, 20 to 30% RCA stenosis, borderline lesion in the left coronary artery had normal FFR.  She had a good anginal response to Ranexa but developed severe constipation and could not tolerate the medication.  She also had intolerance to beta-blockers due to bradycardia.  In December 2015, she was critically ill with acute left iliofemoral DVT, anticoagulation was complicated by retroperitoneal hematoma and hemorrhagic shock requiring placement of inferior vena cava filter.  Filter was removed in June 2016.  Last cardiac catheterization in July 2018 showed unchanged anatomy of eccentric stenosis in the left main coronary artery measuring 70% with noncritical FFR.  Patient was last seen by Dr. Sallyanne Kuster on 02/14/2018, she continued to have some baseline dyspnea which was attributed to obesity and deconditioning.  Despite lower extremity edema, her dry weight was found to be 210 pounds and she appears to be compensated from a volume perspective.  Based on patient was emails, it appears her blood pressure has been elevated recently.  Her blood pressure medication has been changed to losartan-HCTZ 100- 12.5 mg.  After she is started on this dose, she had a significant weakness and dizziness, blood pressure dropped down to the 90s.  Patient presents to cardiology service for further evaluation today complaining of  persistent weakness, fatigue, lack of energy.  This has been chronic for her and multiple work-up has been done in the past.  Her PCP recently reduced her thyroid medication due to low TSH in March 2019.  I will obtain complete metabolic panel, CBC, free T3 and T4 to assess for secondary causes for her weakness and the fatigue.  She says yesterday she had a episode where she became very weak, she also had significant blurry vision's and the trouble to see out of both eye for 15 minutes.  She decided to take 2 sublingual nitroglycerin, this will dropped her blood pressure further down to the 90/60 range.  She has been checking her blood pressure at home, however did not bring a list of her home blood pressure nor her blood pressure cuff with her.  She says during the episode, she also has significant left upper arm pain and neck pain.  She mentions her neck pain is somewhat chronic and hurts every time she moves her body in a particular position.  At this time, repeat cardiac catheterization likely will not help her.  I discussed her case with Dr. Sallyanne Kuster, based on neurological exam, she does not have any focal deficit, her symptoms likely was not a stroke but more likely shift in the blood pressure.  Other than basic lab works to rule out secondary causes, I recommend her to continue to check her blood pressure for the next week and send her blood pressure to either Dr. Sallyanne Kuster or me to review.  Overall some of her symptom is very chronic, and I am not confident  there are further cardiac work-up that can be done to help her.    Past Medical History:  Diagnosis Date  . Arthritis    right knee  . Cancer [facility]) yrs ago   skin cancer removed from face  . Chronic diastolic CHF (congestive heart failure) ([place])   . Chronic venous insufficiency    LEA VENOUS, 10/17/2011 - mild reflux in bilateral common femoral veins  . CKD (chronic kidney disease), stage III ([place])   . Coronary artery disease    a. s/p  PCI/BMS to prox LAD and balloon angioplasty to mLAD with suboptimal result in 2000. b. Abnl nuc 2012, cath 09/2011 - showed that the overall territory of potential ischemia was small and attributable to a moderately diseased small second diagonal artery. Med rx.  Marland Kitchen Dyspnea    with exertion  . Headache   . Hyperlipidemia   . Hypertension   . Hypertensive heart disease   . Hypothyroidism   . Obesity   . Stroke [facility])    pt. states she had "light stroke" in sept. 1980  . Urinary incontinence     Past Surgical History:  Procedure Laterality Date  . ABDOMINAL HYSTERECTOMY  1970's   complete  . ANKLE ARTHROSCOPY Right 07/10/2017   Procedure: ANKLE ARTHROSCOPY;  Surgeon: [name], DPM;  Location: [place];  Service: Podiatry;  Laterality: Right;  . BACK SURGERY  07/26/2016   cervical neck surgery, [place] [facility]  . BLADDER SURGERY  2010   WITH MESH  bladder tach  . bunion removal surgery Bilateral 15 yrs ago  . CARDIAC CATHETERIZATION Left 09/25/2011   Medical management  . CARDIAC CATHETERIZATION Left 03/25/2001   Normal LV function, LAD residual narrowing of less than 10%, normal ramus intermediate, circumflex, and RCA,   . CARDIAC CATHETERIZATION  09/04/1999   LAD, 3x54mm Tetra stent resulting in a reduction of the 80% stenosis to 0% residual  . CARDIAC CATHETERIZATION N/A 01/26/2015   Procedure: Right/Left Heart Cath and Coronary Angiography;  Surgeon: Troy Sine, MD;  Location: [place] CV LAB;  Service: Cardiovascular;  Laterality: N/A;  . CARDIAC CATHETERIZATION N/A 01/27/2015   Procedure: Intravascular Pressure Wire/FFR Study;  Surgeon: Burnell Blanks, MD;  Location: [place] CV LAB;  Service: Cardiovascular;  Laterality: N/A;  . CARDIAC CATHETERIZATION N/A 01/27/2015   Procedure: Right Heart Cath;  Surgeon: Burnell Blanks, MD;  Location: [place] CV LAB;  Service: Cardiovascular;  Laterality: N/A;  . CHOLECYSTECTOMY    . COLONOSCOPY WITH  PROPOFOL N/A 03/07/2017   Procedure: COLONOSCOPY WITH PROPOFOL;  Surgeon: Juanita Craver, MD;  Location: [facility];  Service: Endoscopy;  Laterality: N/A;  . CORONARY STENT PLACEMENT     LAD x 1  . ganglion cyst removal  yrs ago   x 2  . ILIAC VEIN ANGIOPLASTY / STENTING  02/15/2015  . INTRAVASCULAR PRESSURE WIRE/FFR STUDY N/A 04/05/2017   Procedure: Intravascular Pressure Wire/FFR Study;  Surgeon: Martinique, Peter M, MD;  Location: [place] CV LAB;  Service: Cardiovascular;  Laterality: N/A;  . IVC FILTER INSERTION  2016  . IVC FILTER REMOVAL  02/15/2015   at [facility]  . LEFT HEART CATH AND CORONARY ANGIOGRAPHY N/A 04/05/2017   Procedure: Left Heart Cath and Coronary Angiography;  Surgeon: Martinique, Peter M, MD;  Location: [place] CV LAB;  Service: Cardiovascular;  Laterality: N/A;  . LEFT HEART CATHETERIZATION WITH CORONARY ANGIOGRAM N/A 09/25/2011   Procedure: LEFT HEART CATHETERIZATION WITH CORONARY ANGIOGRAM;  Surgeon:  Sanda Klein, MD;  Location: [place] CATH LAB;  Service: Cardiovascular;  Laterality: N/A;  . multiple bladder surgeries to remove mesh    . ROTATOR CUFF REPAIR Right   . stent to groin  08/2014   left leg  . TENDON REPAIR Right 07/10/2017   Procedure: RIGHT PERONEAL TENDON REPAIR;  Surgeon: [name], DPM;  Location: [place];  Service: Podiatry;  Laterality: Right;    Current Medications: Outpatient Medications Prior to Visit  Medication Sig Dispense Refill  . acetaminophen (TYLENOL) 500 MG tablet Take 500-1,000 mg by mouth every 6 (six) hours as needed (for pain.).     Marland Kitchen Apoaequorin (PREVAGEN) 10 MG CAPS Take 1 capsule by mouth daily.     Marland Kitchen aspirin EC 81 MG tablet Take 1 tablet (81 mg total) by mouth daily. 90 tablet 3  . bisacodyl (DULCOLAX) 5 MG EC tablet Take 5 mg by mouth 2 (two) times daily.    . furosemide (LASIX) 80 MG tablet Take 1 tablet (80 mg total) by mouth daily. 90 tablet 3  . gabapentin (NEURONTIN) 100 MG capsule Take 100 mg by mouth daily.  6  .  Homeopathic Products (LEG CRAMP RELIEF) SUBL Place 2 tablets under the tongue at bedtime as needed (cramps).    . isosorbide mononitrate (IMDUR) 30 MG 24 hr tablet TAKE 2 TABLETS TWICE A DAY 120 tablet 3  . levothyroxine (SYNTHROID, LEVOTHROID) 125 MCG tablet Take 125 mcg by mouth daily.  6  . losartan-hydrochlorothiazide (HYZAAR) 100-12.5 MG tablet Take 1 tablet by mouth daily. 90 tablet 1  . Miconazole Nitrate 2 % POWD Apply 1 application topically 2 (two) times daily. (Patient taking differently: Apply 1 application topically 2 (two) times daily as needed (for rash around lower stomach/groin/legs). ) 5 g 0  . Multiple Vitamin (MULTIVITAMIN WITH MINERALS) TABS tablet Take 1 tablet by mouth 2 (two) times daily. Centrum 50+     . nitroGLYCERIN (NITROSTAT) 0.4 MG SL tablet Place 1 tablet (0.4 mg total) under the tongue every 5 (five) minutes as needed for chest pain. 25 tablet 2  . nystatin (MYCOSTATIN/NYSTOP) powder Apply 1 g topically daily as needed (rash).     . potassium chloride 20 MEQ TBCR Take 40 mEq by mouth daily. 180 tablet 3  . pravastatin (PRAVACHOL) 80 MG tablet Take 80 mg by mouth at bedtime.    . vitamin C (ASCORBIC ACID) 500 MG tablet Take 1,000-3,000 mg by mouth daily.     . Magnesium 250 MG TABS Take 1 tablet by mouth daily.    Marland Kitchen ezetimibe (ZETIA) 10 MG tablet Take 1 tablet (10 mg total) by mouth daily. 30 tablet 5  . levothyroxine (SYNTHROID, LEVOTHROID) 137 MCG tablet Take 137 mcg by mouth daily before breakfast.     No facility-administered medications prior to visit.      Allergies:   Atorvastatin; Penicillins; Aminophylline; Contrast media [iodinated diagnostic agents]; Latex; Egg white (diagnostic); Adhesive [tape]; Betadine [povidone iodine]; Ciprofloxacin; Codeine; Gelnique [oxybutynin]; Ranexa [ranolazine]; and Shellfish allergy   Social History   Socioeconomic History  . Marital status: Married    Spouse name: Not on file  . Number of children: Not on file  .  Years of education: Not on file  . Highest education level: Not on file  Occupational History  . Not on file  Social Needs  . Financial resource strain: Not on file  . Food insecurity:    Worry: Not on file    Inability: Not on  file  . Transportation needs:    Medical: Not on file    Non-medical: Not on file  Tobacco Use  . Smoking status: Never Smoker  . Smokeless tobacco: Never Used  Substance and Sexual Activity  . Alcohol use: No  . Drug use: No  . Sexual activity: Not on file  Lifestyle  . Physical activity:    Days per week: Not on file    Minutes per session: Not on file  . Stress: Not on file  Relationships  . Social connections:    Talks on phone: Not on file    Gets together: Not on file    Attends religious service: Not on file    Active member of club or organization: Not on file    Attends meetings of clubs or organizations: Not on file    Relationship status: Not on file  Other Topics Concern  . Not on file  Social History Narrative  . Not on file     Family History:  The patient's family history includes Cancer in her mother; Heart disease in her brother, father, and sister.   ROS:   Please see the history of present illness.    ROS All other systems reviewed and are negative.   PHYSICAL EXAM:   VS:  BP 120/82   Pulse (!) 59   Ht 5' 5.5" (1.664 m)   Wt 211 lb (95.7 kg)   SpO2 94%   BMI 34.58 kg/m    GEN: Well nourished, well developed, in no acute distress  HEENT: normal  Neck: no JVD, carotid bruits, or masses Cardiac: RRR; no murmurs, rubs, or gallops,no edema  Respiratory:  clear to auscultation bilaterally, normal work of breathing GI: soft, nontender, nondistended, + BS MS: no deformity or atrophy  Skin: warm and dry, no rash Neuro:  Alert and Oriented x 3, Strength and sensation are intact Psych: euthymic mood, full affect  Wt Readings from Last 3 Encounters:  03/26/18 211 lb (95.7 kg)  02/14/18 213 lb (96.6 kg)  01/10/18 217 lb  (98.4 kg)      Studies/Labs Reviewed:   EKG:  EKG is ordered today.  The ekg ordered today demonstrates normal sinus rhythm, no significant ST-T wave changes  Recent Labs: 11/18/2017: NT-Pro BNP 101 12/12/2017: BNP 59.8; TSH 0.266 03/26/2018: ALT 27; BUN 19; Creatinine, Ser 0.90; Hemoglobin 15.5; Platelets 222; Potassium 4.0; Sodium 141   Lipid Panel    Component Value Date/Time   CHOL 193 11/18/2017 0857   TRIG 212 (H) 11/18/2017 0857   HDL 47 11/18/2017 0857   CHOLHDL 4.1 11/18/2017 0857   CHOLHDL 4.2 06/27/2015 1336   VLDL 48 (H) 06/27/2015 1336   LDLCALC 104 (H) 11/18/2017 0857    Additional studies/ records that were reviewed today include:   Cath 04/05/2017  LM lesion, 70 %stenosed.  Mid LAD lesion, 0 %stenosed at site of prior stent.  Ost Cx lesion, 60 %stenosed.  Prox RCA-1 lesion, 20 %stenosed.  Prox RCA-2 lesion, 30 %stenosed.  The left ventricular systolic function is normal.  LV end diastolic pressure is normal.  The left ventricular ejection fraction is 55-65% by visual estimate.   1. 70% eccentric distal left main stenosis. Angiographically similar to 2016. FFR 0.89 into the LAD and 0.86 into the LCx suggesting that this lesion is not flow limiting.  2. Otherwise nonobstructive CAD. Patent stent in LAD. 3. Normal LV function 4. Normal LVEDP  Plan: recommend continued medical therapy.   ASSESSMENT:  1. Fatigue, unspecified type   2. Chronic diastolic heart failure ([place])   3. Dizziness   4. Weakness   5. Coronary artery disease involving native coronary artery of native heart without angina pectoris   6. Essential hypertension   7. Hyperlipidemia, unspecified hyperlipidemia type   8. Hypothyroidism, unspecified type   9. CKD (chronic kidney disease), stage III ([place])   10. H/O: CVA (cerebrovascular accident)      PLAN:  In order of problems listed above:  1. Fatigue: Obtain secondary work-up include complete metabolic panel, CBC, free T4  and T3.  She has a chronic history of fatigue.  She says her recent fatigue has been worse than usual.  She denies any obvious chest pain, and given stable anatomy on previous cardiac catheterization, repeat cardiac catheterization likely will not be very beneficial   2. Dizziness: Says there was a episode of significant dizziness associated with back and shoulder pain yesterday.  This was accompanied by blurred vision as well.  I think this may be related possible drop in the blood pressure.  Unfortunately she did not bring her blood pressure cuff or a blood pressure diary.  I recommended for her to continue to monitor her blood pressure and contact us if she does have episodes of hypotension.  3. Chronic diastolic heart failure: She appears to be euvolemic on physical exam.  4. CAD: Stable anatomy on last cardiac catheterization 2018.  Continue aspirin  5. Hypertension: Blood pressure stable.  6. Hyperlipidemia: On Pravachol and Zetia.  7. Hypothyroidism: Managed by primary care provider.  8. History of CVA: Despite blurred vision and generalized weakness yesterday.  There is no focal neurological deficit on today's physical exam.  Her weakness is worse generalized and is not in particular side.    Medication Adjustments/Labs and Tests Ordered: Current medicines are reviewed at length with the patient today.  Concerns regarding medicines are outlined above.  Medication changes, Labs and Tests ordered today are listed in the Patient Instructions below. Patient Instructions  Medication Instructions:  Your physician recommends that you continue on your current medications as directed. Please refer to the Current Medication list given to you today.  Labwork: Your physician recommends that you return for lab work in: TODAY-CMET, CBC, FREE T4, T3,   Testing/Procedures: None  Follow-Up: Your physician recommends that you schedule a follow-up appointment in: 3 months with Dr Sallyanne Kuster.  Any  Other Special Instructions Will Be Listed Below (If Applicable). 1. RECORD your blood pressures daily for 1 week send them to the office to Dr Sallyanne Kuster or Almyra Deforest, PA-C 2. Bring your automated blood pressure cuff to your next visit.  If you need a refill on your cardiac medications before your next appointment, please call your pharmacy.     Hilbert Corrigan, [place]  03/28/2018 10:55 PM    [facility] [place], [place], [place]  [address] Phone: [phone]; Fax: [phone]

## 2018-03-28 ENCOUNTER — Encounter: Payer: Self-pay | Admitting: Physician Assistant

## 2018-04-03 ENCOUNTER — Telehealth: Payer: Self-pay | Admitting: Cardiovascular Disease

## 2018-04-03 NOTE — Telephone Encounter (Signed)
New Message    Patient is calling because she was advised to keep a record of her BP for 7 days. She is calling to provide that information.   7/11  112/76  83 HR 7/12  139/68  73 HR 7/13  152/77  70 HR 7/14  159/89  75 HR 7/15  156/83  62 HR 7/16  140/70  69 HR 7/17  138/72  71 HR  7/18   138/65  66 HR

## 2018-04-03 NOTE — Telephone Encounter (Signed)
Returned call to patient, she reports she is feeling better as far as dizziness and CP/arm pain.   She reports she has been having lower back pain, leg pain and has called Dr. Arnoldo Morale' office about this.   Will route to PA to review BP readings and advise

## 2018-04-04 NOTE — Telephone Encounter (Signed)
Ok blood pressure. Thank you.

## 2018-04-04 NOTE — Telephone Encounter (Signed)
Patient notified no change to meds per PA, BP OK. She voiced understanding

## 2018-04-07 DIAGNOSIS — L820 Inflamed seborrheic keratosis: Secondary | ICD-10-CM | POA: Diagnosis not present

## 2018-04-10 ENCOUNTER — Emergency Department (HOSPITAL_COMMUNITY)
Admission: EM | Admit: 2018-04-10 | Discharge: 2018-04-10 | Disposition: A | Discharge status: Home / Self Care | Payer: PPO | Attending: Emergency Medicine | Admitting: Emergency Medicine

## 2018-04-10 ENCOUNTER — Emergency Department (HOSPITAL_COMMUNITY): Payer: PPO

## 2018-04-10 ENCOUNTER — Encounter (HOSPITAL_COMMUNITY): Payer: Self-pay | Admitting: Emergency Medicine

## 2018-04-10 DIAGNOSIS — N183 Chronic kidney disease, stage 3 (moderate): Secondary | ICD-10-CM | POA: Diagnosis not present

## 2018-04-10 DIAGNOSIS — Z85828 Personal history of other malignant neoplasm of skin: Secondary | ICD-10-CM | POA: Insufficient documentation

## 2018-04-10 DIAGNOSIS — I5032 Chronic diastolic (congestive) heart failure: Secondary | ICD-10-CM | POA: Diagnosis not present

## 2018-04-10 DIAGNOSIS — Z7982 Long term (current) use of aspirin: Secondary | ICD-10-CM | POA: Diagnosis not present

## 2018-04-10 DIAGNOSIS — Z79899 Other long term (current) drug therapy: Secondary | ICD-10-CM | POA: Insufficient documentation

## 2018-04-10 DIAGNOSIS — M545 Low back pain: Secondary | ICD-10-CM | POA: Diagnosis present

## 2018-04-10 DIAGNOSIS — Z955 Presence of coronary angioplasty implant and graft: Secondary | ICD-10-CM | POA: Insufficient documentation

## 2018-04-10 DIAGNOSIS — M5441 Lumbago with sciatica, right side: Secondary | ICD-10-CM | POA: Diagnosis not present

## 2018-04-10 DIAGNOSIS — I2510 Atherosclerotic heart disease of native coronary artery without angina pectoris: Secondary | ICD-10-CM | POA: Insufficient documentation

## 2018-04-10 DIAGNOSIS — I130 Hypertensive heart and chronic kidney disease with heart failure and stage 1 through stage 4 chronic kidney disease, or unspecified chronic kidney disease: Secondary | ICD-10-CM | POA: Diagnosis not present

## 2018-04-10 DIAGNOSIS — M5431 Sciatica, right side: Secondary | ICD-10-CM

## 2018-04-10 DIAGNOSIS — R531 Weakness: Secondary | ICD-10-CM | POA: Diagnosis not present

## 2018-04-10 DIAGNOSIS — E039 Hypothyroidism, unspecified: Secondary | ICD-10-CM | POA: Insufficient documentation

## 2018-04-10 DIAGNOSIS — Z91040 Latex allergy status: Secondary | ICD-10-CM | POA: Diagnosis not present

## 2018-04-10 DIAGNOSIS — M5126 Other intervertebral disc displacement, lumbar region: Secondary | ICD-10-CM | POA: Diagnosis not present

## 2018-04-10 NOTE — ED Triage Notes (Signed)
Pt reports chronic back pain, initially had pain radiation to her L leg, states she had surgery to repair a nerve but then began having pain and weakness in her right leg, contacted her surgeon who advised he would work her in but states the pain has gotten worse and she has not been able to see her doctor yet. Took 2 tylenol for pain. No falls or known injuries

## 2018-04-10 NOTE — ED Notes (Signed)
Patient transported to MRI 

## 2018-04-10 NOTE — ED Provider Notes (Signed)
[facility] EMERGENCY DEPARTMENT Provider Note   CSN: [id number] Arrival date & time: 04/10/18  0325     History   Chief Complaint Chief Complaint  Patient presents with  . Back Pain    HPI Jill Preston is a 84 y.o. female.  The history is provided by the patient.  She has history of hypertension, hyperlipidemia, coronary artery disease, diastolic heart failure, chronic kidney disease and comes in with ongoing pain in her lower back radiating down her right leg.  She states that she had surgery because of a bulging disc on her left side.  Since then, she continues to have low back pain with radiation down the right, but it is getting worse over the last several months.  She also notes that if she walks, she will suddenly lose strength in her right leg.  She denies any bowel or bladder dysfunction.  She denies any numbness or tingling.  She has been trying to get into see her neurosurgeon, but has been unable to get an appointment.  Past Medical History:  Diagnosis Date  . Arthritis    right knee  . Cancer [facility]) yrs ago   skin cancer removed from face  . Chronic diastolic CHF (congestive heart failure) ([place])   . Chronic venous insufficiency    LEA VENOUS, 10/17/2011 - mild reflux in bilateral common femoral veins  . CKD (chronic kidney disease), stage III ([place])   . Coronary artery disease    a. s/p PCI/BMS to prox LAD and balloon angioplasty to mLAD with suboptimal result in 2000. b. Abnl nuc 2012, cath 09/2011 - showed that the overall territory of potential ischemia was small and attributable to a moderately diseased small second diagonal artery. Med rx.  Marland Kitchen Dyspnea    with exertion  . Headache   . Hyperlipidemia   . Hypertension   . Hypertensive heart disease   . Hypothyroidism   . Obesity   . Stroke [facility])    pt. states she had "light stroke" in sept. 1980  . Urinary incontinence     Patient Active Problem List   Diagnosis Date Noted  . Bilateral  leg pain 01/27/2017  . Cervical spine degeneration 10/23/2015  . Hypersomnolence 10/23/2015  . Angina pectoris ([place]) 01/26/2015  . CAD in native artery   . Ischemic chest pain   . Acute thromboembolism of deep veins of lower extremity ([place]) 09/14/2014  . Subclavian artery stenosis, left ([place]) 08/04/2014  . Chronic diastolic CHF (congestive heart failure) ([place]) 07/16/2014  . Essential hypertension 07/08/2014  . CKD (chronic kidney disease), stage III ([place])   . Hypertensive heart disease   . Obesity, Class II, BMI 35-39.9, with comorbidity   . Chronic venous insufficiency   . Chronic diastolic heart failure ([place]) 07/04/2014  . Metatarsal deformity 06/24/2013  . CAD - LAD stent 12/00. Patent '02 and 1/13, moderate D2 managed medically 02/26/2013  . Dyslipidemia 02/26/2013  . DJD (degenerative joint disease)- back 02/26/2013  . Hypothyroidism 07/23/2011  . Accelerated hypertension 07/23/2011    Past Surgical History:  Procedure Laterality Date  . ABDOMINAL HYSTERECTOMY  1970's   complete  . ANKLE ARTHROSCOPY Right 07/10/2017   Procedure: ANKLE ARTHROSCOPY;  Surgeon: [name], DPM;  Location: [facility];  Service: Podiatry;  Laterality: Right;  . BACK SURGERY  07/26/2016   cervical neck surgery, [facility]  . BLADDER SURGERY  2010   WITH MESH  bladder tach  . bunion removal surgery Bilateral  15 yrs ago  . CARDIAC CATHETERIZATION Left 09/25/2011   Medical management  . CARDIAC CATHETERIZATION Left 03/25/2001   Normal LV function, LAD residual narrowing of less than 10%, normal ramus intermediate, circumflex, and RCA,   . CARDIAC CATHETERIZATION  09/04/1999   LAD, 3x49mm Tetra stent resulting in a reduction of the 80% stenosis to 0% residual  . CARDIAC CATHETERIZATION N/A 01/26/2015   Procedure: Right/Left Heart Cath and Coronary Angiography;  Surgeon: Troy Sine, MD;  Location: [facility] CV LAB;  Service: Cardiovascular;  Laterality: N/A;  . CARDIAC  CATHETERIZATION N/A 01/27/2015   Procedure: Intravascular Pressure Wire/FFR Study;  Surgeon: Burnell Blanks, MD;  Location: [facility] CV LAB;  Service: Cardiovascular;  Laterality: N/A;  . CARDIAC CATHETERIZATION N/A 01/27/2015   Procedure: Right Heart Cath;  Surgeon: Burnell Blanks, MD;  Location: [facility] CV LAB;  Service: Cardiovascular;  Laterality: N/A;  . CHOLECYSTECTOMY    . COLONOSCOPY WITH PROPOFOL N/A 03/07/2017   Procedure: COLONOSCOPY WITH PROPOFOL;  Surgeon: Juanita Craver, MD;  Location: [facility] END[facility];  Service: Endoscopy;  Laterality: N/A;  . CORONARY STENT PLACEMENT     LAD x 1  . ganglion cyst removal  yrs ago   x 2  . ILIAC VEIN ANGIOPLASTY / STENTING  02/15/2015  . INTRAVASCULAR PRESSURE WIRE/FFR STUDY N/A 04/05/2017   Procedure: Intravascular Pressure Wire/FFR Study;  Surgeon: Martinique, Peter M, MD;  Location: [facility] CV LAB;  Service: Cardiovascular;  Laterality: N/A;  . IVC FILTER INSERTION  2016  . IVC FILTER REMOVAL  02/15/2015   at [facility]  . LEFT HEART CATH AND CORONARY ANGIOGRAPHY N/A 04/05/2017   Procedure: Left Heart Cath and Coronary Angiography;  Surgeon: Martinique, Peter M, MD;  Location: [facility] CV LAB;  Service: Cardiovascular;  Laterality: N/A;  . LEFT HEART CATHETERIZATION WITH CORONARY ANGIOGRAM N/A 09/25/2011   Procedure: LEFT HEART CATHETERIZATION WITH CORONARY ANGIOGRAM;  Surgeon: Sanda Klein, MD;  Location: [facility] CATH LAB;  Service: Cardiovascular;  Laterality: N/A;  . multiple bladder surgeries to remove mesh    . ROTATOR CUFF REPAIR Right   . stent to groin  08/2014   left leg  . TENDON REPAIR Right 07/10/2017   Procedure: RIGHT PERONEAL TENDON REPAIR;  Surgeon: [name], DPM;  Location: [facility];  Service: Podiatry;  Laterality: Right;     OB History   None      Home Medications    Prior to Admission medications   Medication Sig Start Date End Date Taking? Authorizing Provider  acetaminophen (TYLENOL) 500 MG tablet  Take 500-1,000 mg by mouth every 6 (six) hours as needed (for pain.).    Yes [provider]  Apoaequorin (PREVAGEN) 10 MG CAPS Take 10 mg by mouth daily.    Yes [provider]  aspirin EC 81 MG tablet Take 1 tablet (81 mg total) by mouth daily. 02/26/13  Yes Kilroy, Doreene Burke, PA-C  ezetimibe (ZETIA) 10 MG tablet Take 1 tablet (10 mg total) by mouth daily. 11/26/17 04/10/26 Yes Croitoru, Mihai, MD  furosemide (LASIX) 80 MG tablet Take 1 tablet (80 mg total) by mouth daily. 02/14/18  Yes Croitoru, Mihai, MD  Homeopathic Products (LEG CRAMP RELIEF) SUBL Place 2 tablets under the tongue at bedtime as needed (cramps).   Yes [provider]  isosorbide mononitrate (IMDUR) 30 MG 24 hr tablet TAKE 2 TABLETS TWICE A DAY 03/03/18  Yes Leonie Man, MD  levothyroxine (SYNTHROID, LEVOTHROID) 125 MCG tablet Take  125 mcg by mouth daily. 03/06/18  Yes [provider]  losartan-hydrochlorothiazide (HYZAAR) 100-12.5 MG tablet Take 1 tablet by mouth daily. 03/19/18  Yes Croitoru, Mihai, MD  Multiple Vitamin (MULTIVITAMIN WITH MINERALS) TABS tablet Take 1 tablet by mouth 2 (two) times daily. Centrum 50+    Yes [provider]  nitroGLYCERIN (NITROSTAT) 0.4 MG SL tablet Place 1 tablet (0.4 mg total) under the tongue every 5 (five) minutes as needed for chest pain. 11/15/16  Yes Croitoru, Mihai, MD  potassium chloride 20 MEQ TBCR Take 40 mEq by mouth daily. 02/14/18  Yes Croitoru, Mihai, MD  pravastatin (PRAVACHOL) 80 MG tablet Take 80 mg by mouth at bedtime.   Yes [provider]  vitamin C (ASCORBIC ACID) 500 MG tablet Take 500 mg by mouth daily.    Yes [provider]  Miconazole Nitrate 2 % POWD Apply 1 application topically 2 (two) times daily. Patient not taking: Reported on 04/10/2018 04/15/17   Croitoru, Dani Gobble, MD    Family History Family History  Problem Relation Age of Onset  . Cancer Mother   . Heart disease Father   . Heart disease Sister   . Heart  disease Brother     Social History Social History   Tobacco Use  . Smoking status: Never Smoker  . Smokeless tobacco: Never Used  Substance Use Topics  . Alcohol use: No  . Drug use: No     Allergies   Atorvastatin; Penicillins; Shellfish allergy; Aminophylline; Contrast media [iodinated diagnostic agents]; Latex; Egg white (diagnostic); Adhesive [tape]; Betadine [povidone iodine]; Ciprofloxacin; Codeine; Gelnique [oxybutynin]; and Ranexa [ranolazine]   Review of Systems Review of Systems  All other systems reviewed and are negative.    Physical Exam Updated Vital Signs BP 131/69 (BP Location: Left Arm)   Pulse 70   Temp 98.2 F (36.8 C) (Oral)   Resp 18   SpO2 94%   Physical Exam  Nursing note and vitals reviewed.  84 year old female, resting comfortably and in no acute distress. Vital signs are normal. Oxygen saturation is 94%, which is normal. Head is normocephalic and atraumatic. PERRLA, EOMI. Oropharynx is clear. Neck is nontender and supple without adenopathy or JVD. Back is nontender in the midline.  There is moderate right paralumbar tenderness in the lower lumbar region.  Straight leg raise is positive on the right at 30 degrees, negative on the left.  There is no CVA tenderness. Lungs are clear without rales, wheezes, or rhonchi. Chest is nontender. Heart has regular rate and rhythm without murmur. Abdomen is soft, flat, nontender without masses or hepatosplenomegaly and peristalsis is normoactive. Extremities have 2+ edema, full range of motion is present.  Dorsalis pedis pulses are 2+ bilaterally. Skin is warm and dry without rash. Neurologic: Mental status is normal, cranial nerves are intact, there are no motor or sensory deficits.  ED Treatments / Results   Radiology Mr Lumbar Spine Wo Contrast  Result Date: 04/10/2018 CLINICAL DATA:  Back pain radiating down the left leg. Weakness in the right leg with pain. EXAM: MRI LUMBAR SPINE WITHOUT CONTRAST  TECHNIQUE: Multiplanar, multisequence MR imaging of the lumbar spine was performed. No intravenous contrast was administered. COMPARISON:  12/07/2015 FINDINGS: Segmentation:  5 lumbar type vertebral bodies Alignment: Mild dextroscoliosis, also seen previously. No listhesis. Vertebrae: Degenerative appearing endplate edema about the right aspect of the narrow or L4-5 disc space, new. There has been an interval right-sided laminotomy at this level. No acute fracture. No evidence of  aggressive bone lesion. Conus medullaris and cauda equina: Conus extends to the L1 level. Conus and cauda equina appear normal. Paraspinal and other soft tissues: No acute finding. Expected postoperative changes at L4-5. Disc levels: T12- L1: Minimal disc bulging L1-L2: Mild spondylosis and disc bulging.  No impingement L2-L3: Disc narrowing and endplate ridging with bulge. There is a superimposed left paracentral protrusion. Mild facet spurring. Mild left subarticular recess and foraminal narrowing. L3-L4: Disc narrowing and bulge with a left foraminal protrusion impinging on the left L3 nerve root. Contributory facet spurring. Left foraminal stenosis is moderate with mild progression since prior. Mild bilateral subarticular recess narrowing L4-L5: Circumferential disc bulging with a right paracentral disc protrusion at an annular fissure. There is facet degeneration with small posterior synovial cyst on the left. Interval right-sided laminotomy with improved canal patency. There is progressive right foraminal stenosis. Endplate edema and annular fissure along the right aspect of the disc L5-S1:Disc narrowing and bulging with annular fissures. Right far-lateral endplate spurring and disc bulging impinging on the right L5 nerve root. Patent spinal canal IMPRESSION: 1. Progressive multilevel degenerative disease compared to 2017. 2. L4-5 right foraminal impingement. There is degenerative appearing rightward endplate edema and annular fissure  which could contribute to a L4 radiculopathy. 3. L5-S1 right foraminal impingement. 4. L3-4 left foraminal protrusion impinging on the left L3 nerve root, mildly progressed. Electronically Signed   By: Monte Fantasia M.D.   On: 04/10/2018 07:27    Procedures Procedures   Medications Ordered in ED Medications - No data to display   Initial Impression / Assessment and Plan / ED Course  I have reviewed the triage vital signs and the nursing notes.  Pertinent imaging results that were available during my care of the patient were reviewed by me and considered in my medical decision making (see chart for details).  Right-sided sciatica with apparent right-sided claudication.  This appears to be neurogenic claudication and not vascular claudication.  Old records are reviewed, and she did have an MRI scan 2 years ago which showed a number of disc protrusions.  Will check MRI today.  MRI shows compression of the L4-L5 nerve root from combination of disc bulge and arthritic changes.  This likely accounts for her symptoms.  She is referred back to her neurosurgeon for further evaluation and treatment.  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

## 2018-04-10 NOTE — Discharge Instructions (Signed)
Call your neurosurgeon to arrange further treatment.

## 2018-04-10 NOTE — ED Notes (Signed)
Patient in MRI. Family at bedside.  

## 2018-04-21 DIAGNOSIS — M435X6 Other recurrent vertebral dislocation, lumbar region: Secondary | ICD-10-CM | POA: Diagnosis not present

## 2018-04-21 DIAGNOSIS — M5416 Radiculopathy, lumbar region: Secondary | ICD-10-CM | POA: Diagnosis not present

## 2018-04-23 ENCOUNTER — Telehealth: Payer: Self-pay

## 2018-04-23 NOTE — Telephone Encounter (Signed)
   [facility] Pre-operative Risk Assessment    Request for surgical clearance:  1. What type of surgery is being performed? Lumbar Fusion   2. When is this surgery scheduled? TBD   3. What type of clearance is required (medical clearance vs. Pharmacy clearance to hold med vs. Both)? Both  4. Are there any medications that need to be held prior to surgery and how long? Aspirin   5. Practice name and name of physician performing surgery? [facility] - Dr.Jeffrey Jenkins  6. What is your office phone number [phone]    7.   What is your office fax number [phone]  8.   Anesthesia type (None, local, MAC, general) ? Unknown   Ena Dawley 04/23/2018, 4:31 PM  _________________________________________________________________   (provider comments below)

## 2018-04-24 NOTE — Telephone Encounter (Signed)
Given h/o 73 LM, needs ov with either Dr Sallyanne Kuster or PA to discuss risk of surgery and possible need for repeat functional study preop. Jill Preston

## 2018-04-24 NOTE — Telephone Encounter (Signed)
Dr. Loletha Grayer, this pt needs lumbar fusion, can aspirin be held and for how long?  Please route response back to CV DIV PREOP

## 2018-04-28 NOTE — Telephone Encounter (Signed)
S/w pt has upcoming appt with Rosaria Ferries, PA, and would like to keep this.  Stated Dr. Adline Mango is on vacation and won't be back for two weeks.

## 2018-04-28 NOTE — Telephone Encounter (Signed)
Per Dr. Stanford Breed - will need OV.   Burtis Junes, RN, [facility] [address] [place], [place]  [phone]

## 2018-05-07 DIAGNOSIS — I2510 Atherosclerotic heart disease of native coronary artery without angina pectoris: Secondary | ICD-10-CM | POA: Diagnosis not present

## 2018-05-07 DIAGNOSIS — E782 Mixed hyperlipidemia: Secondary | ICD-10-CM | POA: Diagnosis not present

## 2018-05-07 DIAGNOSIS — I5032 Chronic diastolic (congestive) heart failure: Secondary | ICD-10-CM | POA: Diagnosis not present

## 2018-05-08 ENCOUNTER — Other Ambulatory Visit: Payer: Self-pay | Admitting: Cardiovascular Disease

## 2018-05-08 NOTE — Telephone Encounter (Signed)
Rx sent to pharmacy   

## 2018-05-09 ENCOUNTER — Other Ambulatory Visit: Payer: Self-pay | Admitting: Neurosurgery

## 2018-05-13 DIAGNOSIS — M25561 Pain in right knee: Secondary | ICD-10-CM | POA: Diagnosis not present

## 2018-05-13 DIAGNOSIS — M1711 Unilateral primary osteoarthritis, right knee: Secondary | ICD-10-CM | POA: Diagnosis not present

## 2018-05-14 ENCOUNTER — Encounter: Payer: Self-pay | Admitting: Physician Assistant

## 2018-05-14 ENCOUNTER — Ambulatory Visit (INDEPENDENT_AMBULATORY_CARE_PROVIDER_SITE_OTHER): Payer: PPO | Admitting: Physician Assistant

## 2018-05-14 VITALS — BP 124/62 | HR 70 | Ht 65.5 in | Wt 209.2 lb

## 2018-05-14 DIAGNOSIS — I2510 Atherosclerotic heart disease of native coronary artery without angina pectoris: Secondary | ICD-10-CM | POA: Diagnosis not present

## 2018-05-14 DIAGNOSIS — Z01810 Encounter for preprocedural cardiovascular examination: Secondary | ICD-10-CM | POA: Diagnosis not present

## 2018-05-14 DIAGNOSIS — I5032 Chronic diastolic (congestive) heart failure: Secondary | ICD-10-CM

## 2018-05-14 MED ORDER — AMLODIPINE BESYLATE 2.5 MG PO TABS: 2.5000 mg | ORAL_TABLET | Freq: Every day | ORAL | 3 refills | Status: DC

## 2018-05-14 NOTE — Patient Instructions (Signed)
MEDICATION INSTRUCTIONS  START AMLODIPINE 2.5 MG  ONE TABLET DAILY     YOU HAVE CLEARANCE FOR SURGERY-WILL SEND TO DR Arnoldo Morale    Your physician recommends that you schedule a follow-up appointment in [place].    If you need a refill on your cardiac medications before your next appointment, please call your pharmacy.

## 2018-05-14 NOTE — Progress Notes (Signed)
Cardiology Office Note   Date:  05/14/2018   ID:  Jill Preston, DOB 08-Dec-1932, MRN [id number]  PCP:  Merrilee Seashore, MD  Cardiologist: Dr. Sallyanne Kuster 02/14/2018 Almyra Deforest  [facility] 03/26/2018 Rosaria Ferries, PA-C   Chief Complaint  Patient presents with  . Medical Clearance    History of Present Illness: Jill Preston is a 84 y.o. female with a history of BMS mLAD 2013, D-CHF, LE edema R>L, HTN, HLD, CKD III, CVA, hypothyroid, obesity. 2018 cath w/ LAD stent patent, CFX 60%, RCA 30%, 70% Lmain w/ nl FFR, L ileofem DVT 2015 w/ RP hematoma s/p IVC filter>removed 2016  03/26/2018 office visit, c/o weakness and fatigue, blurry vision episode described. BP checks recommended, TFTs checked, euvolemic at 211 lbs  Jill Preston presents for preop evaluation.  She had back surgery a few years ago, but now needs fusion and a rod.   She climbs 7-8 stairs to get in the house, goes slowly due to back and R knee problems. She gets winded with activity, even as minor as washing dishes.  She has been this way for years, no significant recent change.  She does not wake with lower extremity edema. She gets some lower extremity swelling during the day, more if she is on her feet a lot.    She has swelling in her knee and has significant right knee pain.  She is to start getting Synvisc injections in her knee soon.  She cannot have steroids until after the back surgery.  She gets pain on the L lateral chest and L arm at times. It is not exertional. It can wake her in the night. Happens several times during the week.   She does not wake with LE edema, gets it during the day. She wakes with PND, describes orthopnea.  These are chronic and have not changed recently.   Past Medical History:  Diagnosis Date  . Arthritis    right knee  . Cancer [facility]) yrs ago   skin cancer removed from face  . Chronic diastolic CHF (congestive heart failure) ([place])   . Chronic venous insufficiency    LEA  VENOUS, 10/17/2011 - mild reflux in bilateral common femoral veins  . CKD (chronic kidney disease), stage III ([place])   . Coronary artery disease    a. s/p PCI/BMS to prox LAD and balloon angioplasty to mLAD with suboptimal result in 2000. b. Abnl nuc 2012, cath 09/2011 - showed that the overall territory of potential ischemia was small and attributable to a moderately diseased small second diagonal artery. Med rx.  Marland Kitchen Dyspnea    with exertion  . Headache   . Hyperlipidemia   . Hypertension   . Hypertensive heart disease   . Hypothyroidism   . Obesity   . Stroke [facility])    pt. states she had "light stroke" in sept. 1980  . Urinary incontinence     Past Surgical History:  Procedure Laterality Date  . ABDOMINAL HYSTERECTOMY  1970's   complete  . ANKLE ARTHROSCOPY Right 07/10/2017   Procedure: ANKLE ARTHROSCOPY;  Surgeon: [name], DPM;  Location: [place];  Service: Podiatry;  Laterality: Right;  . BACK SURGERY  07/26/2016   cervical neck surgery, [place] [facility]  . BLADDER SURGERY  2010   WITH MESH  bladder tach  . bunion removal surgery Bilateral 15 yrs ago  . CARDIAC CATHETERIZATION Left 09/25/2011   Medical management  . CARDIAC CATHETERIZATION Left 03/25/2001  Normal LV function, LAD residual narrowing of less than 10%, normal ramus intermediate, circumflex, and RCA,   . CARDIAC CATHETERIZATION  09/04/1999   LAD, 3x55mm Tetra stent resulting in a reduction of the 80% stenosis to 0% residual  . CARDIAC CATHETERIZATION N/A 01/26/2015   Procedure: Right/Left Heart Cath and Coronary Angiography;  Surgeon: Troy Sine, MD;  Location: [place] CV LAB;  Service: Cardiovascular;  Laterality: N/A;  . CARDIAC CATHETERIZATION N/A 01/27/2015   Procedure: Intravascular Pressure Wire/FFR Study;  Surgeon: Burnell Blanks, MD;  Location: [place] CV LAB;  Service: Cardiovascular;  Laterality: N/A;  . CARDIAC CATHETERIZATION N/A 01/27/2015   Procedure: Right Heart Cath;   Surgeon: Burnell Blanks, MD;  Location: [place] CV LAB;  Service: Cardiovascular;  Laterality: N/A;  . CHOLECYSTECTOMY    . COLONOSCOPY WITH PROPOFOL N/A 03/07/2017   Procedure: COLONOSCOPY WITH PROPOFOL;  Surgeon: Juanita Craver, MD;  [facility];  Service: Endoscopy;  Laterality: N/A;  . CORONARY STENT PLACEMENT     LAD x 1  . ganglion cyst removal  yrs ago   x 2  . ILIAC VEIN ANGIOPLASTY / STENTING  02/15/2015  . INTRAVASCULAR PRESSURE WIRE/FFR STUDY N/A 04/05/2017   Procedure: Intravascular Pressure Wire/FFR Study;  Surgeon: Martinique, Peter M, MD;  Location: [place] CV LAB;  Service: Cardiovascular;  Laterality: N/A;  . IVC FILTER INSERTION  2016  . IVC FILTER REMOVAL  02/15/2015   at [facility]  . LEFT HEART CATH AND CORONARY ANGIOGRAPHY N/A 04/05/2017   Procedure: Left Heart Cath and Coronary Angiography;  Surgeon: Martinique, Peter M, MD;  Location: [place] CV LAB;  Service: Cardiovascular;  Laterality: N/A;  . LEFT HEART CATHETERIZATION WITH CORONARY ANGIOGRAM N/A 09/25/2011   Procedure: LEFT HEART CATHETERIZATION WITH CORONARY ANGIOGRAM;  Surgeon: Sanda Klein, MD;  Location: [place] CATH LAB;  Service: Cardiovascular;  Laterality: N/A;  . multiple bladder surgeries to remove mesh    . ROTATOR CUFF REPAIR Right   . stent to groin  08/2014   left leg  . TENDON REPAIR Right 07/10/2017   Procedure: RIGHT PERONEAL TENDON REPAIR;  Surgeon: [name], DPM;  Location: [place];  Service: Podiatry;  Laterality: Right;    Current Outpatient Medications  Medication Sig Dispense Refill  . acetaminophen (TYLENOL) 500 MG tablet Take 500-1,000 mg by mouth every 6 (six) hours as needed (for pain.).     Marland Kitchen Apoaequorin (PREVAGEN) 10 MG CAPS Take 10 mg by mouth daily.     Marland Kitchen aspirin EC 81 MG tablet Take 1 tablet (81 mg total) by mouth daily. 90 tablet 3  . ezetimibe (ZETIA) 10 MG tablet TAKE 1 TABLET BY MOUTH EVERY DAY 90 tablet 1  . furosemide (LASIX) 80 MG tablet Take 1 tablet (80  mg total) by mouth daily. 90 tablet 3  . Homeopathic Products (LEG CRAMP RELIEF) SUBL Place 2 tablets under the tongue at bedtime as needed (cramps).    . isosorbide mononitrate (IMDUR) 30 MG 24 hr tablet TAKE 2 TABLETS TWICE A DAY 120 tablet 3  . levothyroxine (SYNTHROID, LEVOTHROID) 125 MCG tablet Take 125 mcg by mouth daily.  6  . losartan-hydrochlorothiazide (HYZAAR) 100-12.5 MG tablet Take 1 tablet by mouth daily. 90 tablet 1  . Multiple Vitamin (MULTIVITAMIN WITH MINERALS) TABS tablet Take 1 tablet by mouth 2 (two) times daily. Centrum 50+     . nitroGLYCERIN (NITROSTAT) 0.4 MG SL tablet Place 1 tablet (0.4 mg total) under the tongue every  5 (five) minutes as needed for chest pain. 25 tablet 2  . potassium chloride 20 MEQ TBCR Take 40 mEq by mouth daily. 180 tablet 3  . pravastatin (PRAVACHOL) 80 MG tablet Take 80 mg by mouth at bedtime.    . vitamin C (ASCORBIC ACID) 500 MG tablet Take 500 mg by mouth daily.      No current facility-administered medications for this visit.     Allergies:   Atorvastatin; Penicillins; Shellfish allergy; Aminophylline; Eggs or egg-derived products; Iodinated diagnostic agents; Latex; Egg white (diagnostic); Oxybutynin chloride; Adhesive [tape]; Betadine [povidone iodine]; Ciprofloxacin; Codeine; Gelnique [oxybutynin]; and Ranolazine    Social History:  The patient  reports that she has never smoked. She has never used smokeless tobacco. She reports that she does not drink alcohol or use drugs.   Family History:  The patient's family history includes Cancer in her mother; Heart disease in her brother, father, and sister.    ROS:  Please see the history of present illness. All other systems are reviewed and negative.    PHYSICAL EXAM: VS:  BP 124/62   Pulse 70   Ht 5' 5.5" (1.664 m)   Wt 209 lb 3.2 oz (94.9 kg)   BMI 34.28 kg/m  , BMI Body mass index is 34.28 kg/m. GEN: Well nourished, well developed, female in no acute distress  HEENT: normal for  age  Neck: no JVD, no carotid bruit, no masses Cardiac: RRR; soft murmur, no rubs, or gallops Respiratory:  clear to auscultation bilaterally, normal work of breathing GI: soft, nontender, nondistended, + BS MS: no deformity or atrophy; no edema; distal pulses are 2+ in all 4 extremities   Skin: warm and dry, no rash Neuro:  Strength and sensation are intact Psych: euthymic mood, full affect   EKG:  EKG is ordered today. The ekg ordered today demonstrates sinus rhythm, lateral ST wave abnormality no significant change from 03/26/2018  Cardiac CATH: 04/05/2017  LM lesion, 70 %stenosed.  Mid LAD lesion, 0 %stenosed at site of prior stent.  Ost Cx lesion, 60 %stenosed.  Prox RCA-1 lesion, 20 %stenosed.  Prox RCA-2 lesion, 30 %stenosed.  The left ventricular systolic function is normal.  LV end diastolic pressure is normal.  The left ventricular ejection fraction is 55-65% by visual estimate.  1. 70% eccentric distal left main stenosis. Angiographically similar to 2016. FFR 0.89 into the LAD and 0.86 into the LCx suggesting that this lesion is not flow limiting.  2. Otherwise nonobstructive CAD. Patent stent in LAD. 3. Normal LV function 4. Normal LVEDP Plan: recommend continued medical therapy.  Diagnostic Diagram          Recent Labs: 11/18/2017: NT-Pro BNP 101 12/12/2017: BNP 59.8; TSH 0.266 03/26/2018: ALT 27; BUN 19; Creatinine, Ser 0.90; Hemoglobin 15.5; Platelets 222; Potassium 4.0; Sodium 141    Lipid Panel    Component Value Date/Time   CHOL 193 11/18/2017 0857   TRIG 212 (H) 11/18/2017 0857   HDL 47 11/18/2017 0857   CHOLHDL 4.1 11/18/2017 0857   CHOLHDL 4.2 06/27/2015 1336   VLDL 48 (H) 06/27/2015 1336   LDLCALC 104 (H) 11/18/2017 0857     Wt Readings from Last 3 Encounters:  05/14/18 209 lb 3.2 oz (94.9 kg)  03/26/18 211 lb (95.7 kg)  02/14/18 213 lb (96.6 kg)     Other studies Reviewed: Additional studies/ records that were reviewed today  include: Office notes, hospital records and testing.  ASSESSMENT AND PLAN:  1.  Preop  evaluation: I reviewed the patient's history and cath report with Dr. Sallyanne Kuster.  Her activity level is limited by her musculoskeletal issues.  Her dyspnea on exertion is chronic, with no recent change.  Her weight is stable or trending down, no volume overload by exam.  Vital signs are stable.  A cardiac catheterization less than a year ago, showed stable disease, nonobstructive by FFR. -Jill Preston is at low-moderate risk for the planned procedure, no further cardiac testing is indicated.  2.  CAD: Continue baby aspirin, Zetia, high-dose Imdur, and losartan/HCTZ. -She is currently not on a beta-blocker and does not have a reported allergy to beta-blockers, will leave this to Dr. Sallyanne Kuster. - She is on a high dose of them to her and has not tolerated Ranexa in the past. - Try low-dose amlodipine to see if this helps her symptoms, she is at risk to have small vessel disease.  3.  Chronic diastolic CHF: She has no volume overload by exam, weight is stable -Continue current Lasix dosing.   Current medicines are reviewed at length with the patient today.  The patient does not have concerns regarding medicines.  The following changes have been made: Add low-dose amlodipine  Labs/ tests ordered today include:  No orders of the defined types were placed in this encounter.    Disposition:   FU with Dr. Sallyanne Kuster  Signed, Rosaria Ferries, PA-C  05/14/2018 1:36 PM    [place] Phone: [phone]; Fax: [phone]  This note was written with the assistance of speech recognition software. Please excuse any transcriptional errors.

## 2018-05-14 NOTE — Progress Notes (Signed)
Agree. I do not think she would benefit from additional cardiovascular work-up prior to the planned back surgery. MCr

## 2018-05-15 DIAGNOSIS — E039 Hypothyroidism, unspecified: Secondary | ICD-10-CM | POA: Diagnosis not present

## 2018-05-15 DIAGNOSIS — I2510 Atherosclerotic heart disease of native coronary artery without angina pectoris: Secondary | ICD-10-CM | POA: Diagnosis not present

## 2018-05-15 DIAGNOSIS — I5032 Chronic diastolic (congestive) heart failure: Secondary | ICD-10-CM | POA: Diagnosis not present

## 2018-05-15 DIAGNOSIS — N183 Chronic kidney disease, stage 3 (moderate): Secondary | ICD-10-CM | POA: Diagnosis not present

## 2018-05-15 DIAGNOSIS — H00014 Hordeolum externum left upper eyelid: Secondary | ICD-10-CM | POA: Diagnosis not present

## 2018-05-15 DIAGNOSIS — E782 Mixed hyperlipidemia: Secondary | ICD-10-CM | POA: Diagnosis not present

## 2018-05-15 DIAGNOSIS — I10 Essential (primary) hypertension: Secondary | ICD-10-CM | POA: Diagnosis not present

## 2018-05-20 DIAGNOSIS — M25561 Pain in right knee: Secondary | ICD-10-CM | POA: Diagnosis not present

## 2018-05-20 DIAGNOSIS — M1711 Unilateral primary osteoarthritis, right knee: Secondary | ICD-10-CM | POA: Diagnosis not present

## 2018-05-20 NOTE — Addendum Note (Signed)
Addended by: Leanord Asal T on: 05/20/2018 04:46 PM   Modules accepted: Orders

## 2018-05-30 ENCOUNTER — Other Ambulatory Visit: Payer: Self-pay

## 2018-05-30 ENCOUNTER — Other Ambulatory Visit: Payer: Self-pay | Admitting: Neurosurgery

## 2018-05-30 ENCOUNTER — Encounter (HOSPITAL_COMMUNITY): Payer: Self-pay | Admitting: *Deleted

## 2018-06-02 ENCOUNTER — Other Ambulatory Visit: Payer: Self-pay

## 2018-06-02 ENCOUNTER — Inpatient Hospital Stay (HOSPITAL_COMMUNITY): Payer: PPO | Admitting: Anesthesiology

## 2018-06-02 ENCOUNTER — Inpatient Hospital Stay (HOSPITAL_COMMUNITY)
Admission: RE | Admit: 2018-06-02 | Discharge: 2018-06-03 | DRG: 454 | Disposition: A | Discharge status: Home / Self Care | Payer: PPO | Attending: Neurosurgery | Admitting: Neurosurgery

## 2018-06-02 ENCOUNTER — Encounter (HOSPITAL_COMMUNITY): Payer: Self-pay | Admitting: *Deleted

## 2018-06-02 ENCOUNTER — Inpatient Hospital Stay (HOSPITAL_COMMUNITY): Payer: PPO

## 2018-06-02 ENCOUNTER — Encounter (HOSPITAL_COMMUNITY)
Admission: RE | Disposition: A | Discharge status: Home / Self Care | Payer: Self-pay | Source: Home / Self Care | Attending: Neurosurgery

## 2018-06-02 DIAGNOSIS — Z90710 Acquired absence of both cervix and uterus: Secondary | ICD-10-CM

## 2018-06-02 DIAGNOSIS — M48061 Spinal stenosis, lumbar region without neurogenic claudication: Secondary | ICD-10-CM | POA: Diagnosis present

## 2018-06-02 DIAGNOSIS — Z9861 Coronary angioplasty status: Secondary | ICD-10-CM

## 2018-06-02 DIAGNOSIS — M1990 Unspecified osteoarthritis, unspecified site: Secondary | ICD-10-CM | POA: Diagnosis present

## 2018-06-02 DIAGNOSIS — E039 Hypothyroidism, unspecified: Secondary | ICD-10-CM | POA: Diagnosis present

## 2018-06-02 DIAGNOSIS — M5416 Radiculopathy, lumbar region: Secondary | ICD-10-CM | POA: Diagnosis present

## 2018-06-02 DIAGNOSIS — Z95828 Presence of other vascular implants and grafts: Secondary | ICD-10-CM

## 2018-06-02 DIAGNOSIS — Z7982 Long term (current) use of aspirin: Secondary | ICD-10-CM

## 2018-06-02 DIAGNOSIS — Z419 Encounter for procedure for purposes other than remedying health state, unspecified: Secondary | ICD-10-CM

## 2018-06-02 DIAGNOSIS — E785 Hyperlipidemia, unspecified: Secondary | ICD-10-CM | POA: Diagnosis present

## 2018-06-02 DIAGNOSIS — N183 Chronic kidney disease, stage 3 (moderate): Secondary | ICD-10-CM | POA: Diagnosis present

## 2018-06-02 DIAGNOSIS — Z888 Allergy status to other drugs, medicaments and biological substances status: Secondary | ICD-10-CM | POA: Diagnosis not present

## 2018-06-02 DIAGNOSIS — I872 Venous insufficiency (chronic) (peripheral): Secondary | ICD-10-CM | POA: Diagnosis not present

## 2018-06-02 DIAGNOSIS — Z981 Arthrodesis status: Secondary | ICD-10-CM | POA: Diagnosis not present

## 2018-06-02 DIAGNOSIS — Z885 Allergy status to narcotic agent status: Secondary | ICD-10-CM

## 2018-06-02 DIAGNOSIS — I130 Hypertensive heart and chronic kidney disease with heart failure and stage 1 through stage 4 chronic kidney disease, or unspecified chronic kidney disease: Secondary | ICD-10-CM | POA: Diagnosis not present

## 2018-06-02 DIAGNOSIS — I5032 Chronic diastolic (congestive) heart failure: Secondary | ICD-10-CM | POA: Diagnosis present

## 2018-06-02 DIAGNOSIS — M5126 Other intervertebral disc displacement, lumbar region: Secondary | ICD-10-CM | POA: Diagnosis present

## 2018-06-02 DIAGNOSIS — Z91013 Allergy to seafood: Secondary | ICD-10-CM

## 2018-06-02 DIAGNOSIS — Z8673 Personal history of transient ischemic attack (TIA), and cerebral infarction without residual deficits: Secondary | ICD-10-CM

## 2018-06-02 DIAGNOSIS — Z91041 Radiographic dye allergy status: Secondary | ICD-10-CM

## 2018-06-02 DIAGNOSIS — Z9889 Other specified postprocedural states: Secondary | ICD-10-CM | POA: Diagnosis not present

## 2018-06-02 DIAGNOSIS — I252 Old myocardial infarction: Secondary | ICD-10-CM | POA: Diagnosis not present

## 2018-06-02 DIAGNOSIS — Z91040 Latex allergy status: Secondary | ICD-10-CM

## 2018-06-02 DIAGNOSIS — Z6833 Body mass index (BMI) 33.0-33.9, adult: Secondary | ICD-10-CM

## 2018-06-02 DIAGNOSIS — Z79890 Hormone replacement therapy (postmenopausal): Secondary | ICD-10-CM

## 2018-06-02 DIAGNOSIS — R0602 Shortness of breath: Secondary | ICD-10-CM | POA: Diagnosis not present

## 2018-06-02 DIAGNOSIS — S33141A Dislocation of L4/L5 lumbar vertebra, initial encounter: Secondary | ICD-10-CM | POA: Diagnosis not present

## 2018-06-02 DIAGNOSIS — Z91012 Allergy to eggs: Secondary | ICD-10-CM | POA: Diagnosis not present

## 2018-06-02 DIAGNOSIS — Z85828 Personal history of other malignant neoplasm of skin: Secondary | ICD-10-CM | POA: Diagnosis not present

## 2018-06-02 DIAGNOSIS — E669 Obesity, unspecified: Secondary | ICD-10-CM | POA: Diagnosis present

## 2018-06-02 DIAGNOSIS — M5116 Intervertebral disc disorders with radiculopathy, lumbar region: Secondary | ICD-10-CM | POA: Diagnosis not present

## 2018-06-02 DIAGNOSIS — I25119 Atherosclerotic heart disease of native coronary artery with unspecified angina pectoris: Secondary | ICD-10-CM | POA: Diagnosis present

## 2018-06-02 DIAGNOSIS — Z880 Allergy status to penicillin: Secondary | ICD-10-CM

## 2018-06-02 DIAGNOSIS — Z79899 Other long term (current) drug therapy: Secondary | ICD-10-CM

## 2018-06-02 DIAGNOSIS — Z9049 Acquired absence of other specified parts of digestive tract: Secondary | ICD-10-CM | POA: Diagnosis not present

## 2018-06-02 DIAGNOSIS — Z809 Family history of malignant neoplasm, unspecified: Secondary | ICD-10-CM

## 2018-06-02 DIAGNOSIS — Z8249 Family history of ischemic heart disease and other diseases of the circulatory system: Secondary | ICD-10-CM

## 2018-06-02 HISTORY — DX: Personal history of other medical treatment: Z92.89

## 2018-06-02 HISTORY — DX: Pneumonia, unspecified organism: J18.9

## 2018-06-02 LAB — TYPE AND SCREEN
ABO/RH(D): A POS
Antibody Screen: NEGATIVE

## 2018-06-02 LAB — CBC
HCT: 44.7 % (ref 36.0–46.0)
Hemoglobin: 14.7 g/dL (ref 12.0–15.0)
MCH: 31.5 pg (ref 26.0–34.0)
MCHC: 32.9 g/dL (ref 30.0–36.0)
MCV: 95.9 fL (ref 78.0–100.0)
Platelets: 183 10*3/uL (ref 150–400)
RBC: 4.66 MIL/uL (ref 3.87–5.11)
RDW: 12.8 % (ref 11.5–15.5)
WBC: 5.4 10*3/uL (ref 4.0–10.5)

## 2018-06-02 LAB — ABO/RH: ABO/RH(D): A POS

## 2018-06-02 LAB — BASIC METABOLIC PANEL
Anion gap: 13 (ref 5–15)
BUN: 21 mg/dL (ref 8–23)
CO2: 28 mmol/L (ref 22–32)
Calcium: 9.7 mg/dL (ref 8.9–10.3)
Chloride: 99 mmol/L (ref 98–111)
Creatinine, Ser: 0.9 mg/dL (ref 0.44–1.00)
GFR calc Af Amer: >60 mL/min (ref >60)
GFR calc non Af Amer: 58 mL/min — ABNORMAL LOW (ref >60)
Glucose, Bld: 110 mg/dL — ABNORMAL HIGH (ref 70–99)
Potassium: 3.4 mmol/L — ABNORMAL LOW (ref 3.5–5.1)
Sodium: 140 mmol/L (ref 135–145)

## 2018-06-02 LAB — GLUCOSE, CAPILLARY: Glucose-Capillary: 129 mg/dL — ABNORMAL HIGH (ref 70–99)

## 2018-06-02 SURGERY — POSTERIOR LUMBAR FUSION 1 LEVEL
Anesthesia: General | Site: Spine Lumbar

## 2018-06-02 MED ORDER — DOCUSATE SODIUM 100 MG PO CAPS
100.0000 mg | ORAL_CAPSULE | Freq: Two times a day (BID) | ORAL | Status: DC
  Administered 2018-06-02: 100 mg via ORAL
  Filled 2018-06-02 (×2): qty 1

## 2018-06-02 MED ORDER — PROPOFOL 10 MG/ML IV BOLUS
INTRAVENOUS | Status: AC
  Filled 2018-06-02: qty 20

## 2018-06-02 MED ORDER — THROMBIN 5000 UNITS EX SOLR
CUTANEOUS | Status: AC
  Filled 2018-06-02: qty 5000

## 2018-06-02 MED ORDER — FENTANYL CITRATE (PF) 100 MCG/2ML IJ SOLN
INTRAMUSCULAR | Status: AC
  Filled 2018-06-02: qty 2

## 2018-06-02 MED ORDER — LIDOCAINE 2% (20 MG/ML) 5 ML SYRINGE
INTRAMUSCULAR | Status: AC
  Filled 2018-06-02: qty 5

## 2018-06-02 MED ORDER — SODIUM CHLORIDE 0.9 % IV SOLN
INTRAVENOUS | Status: DC | PRN
  Administered 2018-06-02: 16:00:00

## 2018-06-02 MED ORDER — ACETAMINOPHEN 650 MG RE SUPP: 650.0000 mg | RECTAL | Status: DC | PRN

## 2018-06-02 MED ORDER — BISACODYL 10 MG RE SUPP: 10.0000 mg | Freq: Every day | RECTAL | Status: DC | PRN

## 2018-06-02 MED ORDER — BUPIVACAINE-EPINEPHRINE (PF) 0.5% -1:200000 IJ SOLN
INTRAMUSCULAR | Status: DC | PRN
  Administered 2018-06-02: 10 mL

## 2018-06-02 MED ORDER — SODIUM CHLORIDE 0.9 % IV SOLN
INTRAVENOUS | Status: DC | PRN
  Administered 2018-06-02: 25 ug/min via INTRAVENOUS

## 2018-06-02 MED ORDER — BACITRACIN ZINC 500 UNIT/GM EX OINT
TOPICAL_OINTMENT | CUTANEOUS | Status: AC
  Filled 2018-06-02: qty 28.35

## 2018-06-02 MED ORDER — FENTANYL CITRATE (PF) 250 MCG/5ML IJ SOLN
INTRAMUSCULAR | Status: AC
  Filled 2018-06-02: qty 5

## 2018-06-02 MED ORDER — LEVOTHYROXINE SODIUM 112 MCG PO TABS
112.0000 ug | ORAL_TABLET | Freq: Every day | ORAL | Status: DC
  Administered 2018-06-03: 112 ug via ORAL
  Filled 2018-06-02: qty 1

## 2018-06-02 MED ORDER — ONDANSETRON HCL 4 MG/2ML IJ SOLN
INTRAMUSCULAR | Status: DC | PRN
  Administered 2018-06-02: 4 mg via INTRAVENOUS

## 2018-06-02 MED ORDER — VANCOMYCIN HCL 1000 MG IV SOLR
INTRAVENOUS | Status: DC | PRN
  Administered 2018-06-02: 1000 mg

## 2018-06-02 MED ORDER — MORPHINE SULFATE (PF) 4 MG/ML IV SOLN: 4.0000 mg | INTRAVENOUS | Status: DC | PRN

## 2018-06-02 MED ORDER — ACETAMINOPHEN 325 MG PO TABS: 650.0000 mg | ORAL_TABLET | ORAL | Status: DC | PRN

## 2018-06-02 MED ORDER — GLYCOPYRROLATE PF 0.2 MG/ML IJ SOSY
PREFILLED_SYRINGE | INTRAMUSCULAR | Status: AC
  Filled 2018-06-02: qty 1

## 2018-06-02 MED ORDER — FENTANYL CITRATE (PF) 100 MCG/2ML IJ SOLN
INTRAMUSCULAR | Status: DC | PRN
  Administered 2018-06-02 (×4): 50 ug via INTRAVENOUS

## 2018-06-02 MED ORDER — OXYCODONE HCL 5 MG PO TABS: 10.0000 mg | ORAL_TABLET | ORAL | Status: DC | PRN

## 2018-06-02 MED ORDER — ZOLPIDEM TARTRATE 5 MG PO TABS: 5.0000 mg | ORAL_TABLET | Freq: Every evening | ORAL | Status: DC | PRN

## 2018-06-02 MED ORDER — FENTANYL CITRATE (PF) 100 MCG/2ML IJ SOLN
25.0000 ug | INTRAMUSCULAR | Status: DC | PRN
  Administered 2018-06-02: 25 ug via INTRAVENOUS

## 2018-06-02 MED ORDER — PHENOL 1.4 % MT LIQD: 1.0000 | OROMUCOSAL | Status: DC | PRN

## 2018-06-02 MED ORDER — BUPIVACAINE LIPOSOME 1.3 % IJ SUSP
20.0000 mL | Freq: Once | INTRAMUSCULAR | Status: AC
  Administered 2018-06-02: 20 mL
  Filled 2018-06-02 (×2): qty 20

## 2018-06-02 MED ORDER — CHLORHEXIDINE GLUCONATE CLOTH 2 % EX PADS: 6.0000 | MEDICATED_PAD | Freq: Once | CUTANEOUS | Status: DC

## 2018-06-02 MED ORDER — ONDANSETRON HCL 4 MG PO TABS: 4.0000 mg | ORAL_TABLET | Freq: Four times a day (QID) | ORAL | Status: DC | PRN

## 2018-06-02 MED ORDER — SUGAMMADEX SODIUM 200 MG/2ML IV SOLN
INTRAVENOUS | Status: DC | PRN
  Administered 2018-06-02: 200 mg via INTRAVENOUS

## 2018-06-02 MED ORDER — DEXAMETHASONE SODIUM PHOSPHATE 10 MG/ML IJ SOLN
INTRAMUSCULAR | Status: AC
  Filled 2018-06-02: qty 1

## 2018-06-02 MED ORDER — MENTHOL 3 MG MT LOZG: 1.0000 | LOZENGE | OROMUCOSAL | Status: DC | PRN

## 2018-06-02 MED ORDER — SODIUM CHLORIDE 0.9% FLUSH: 3.0000 mL | INTRAVENOUS | Status: DC | PRN

## 2018-06-02 MED ORDER — BUPIVACAINE-EPINEPHRINE (PF) 0.5% -1:200000 IJ SOLN
INTRAMUSCULAR | Status: AC
  Filled 2018-06-02: qty 30

## 2018-06-02 MED ORDER — BACITRACIN ZINC 500 UNIT/GM EX OINT
TOPICAL_OINTMENT | CUTANEOUS | Status: DC | PRN
  Administered 2018-06-02: 1 via TOPICAL

## 2018-06-02 MED ORDER — 0.9 % SODIUM CHLORIDE (POUR BTL) OPTIME
TOPICAL | Status: DC | PRN
  Administered 2018-06-02: 1000 mL

## 2018-06-02 MED ORDER — THROMBIN 5000 UNITS EX SOLR
OROMUCOSAL | Status: DC | PRN
  Administered 2018-06-02: 16:00:00 via TOPICAL

## 2018-06-02 MED ORDER — FUROSEMIDE 40 MG PO TABS
80.0000 mg | ORAL_TABLET | Freq: Every day | ORAL | Status: DC
  Administered 2018-06-02: 80 mg via ORAL
  Filled 2018-06-02 (×2): qty 2

## 2018-06-02 MED ORDER — PRAVASTATIN SODIUM 40 MG PO TABS
80.0000 mg | ORAL_TABLET | Freq: Every day | ORAL | Status: DC
  Administered 2018-06-02: 80 mg via ORAL
  Filled 2018-06-02: qty 2

## 2018-06-02 MED ORDER — ONDANSETRON HCL 4 MG/2ML IJ SOLN
INTRAMUSCULAR | Status: AC
  Filled 2018-06-02: qty 2

## 2018-06-02 MED ORDER — ROCURONIUM BROMIDE 50 MG/5ML IV SOSY
PREFILLED_SYRINGE | INTRAVENOUS | Status: DC | PRN
  Administered 2018-06-02: 50 mg via INTRAVENOUS
  Administered 2018-06-02: 25 mg via INTRAVENOUS

## 2018-06-02 MED ORDER — ISOSORBIDE MONONITRATE ER 60 MG PO TB24
60.0000 mg | ORAL_TABLET | Freq: Two times a day (BID) | ORAL | Status: DC
  Administered 2018-06-02: 60 mg via ORAL
  Filled 2018-06-02 (×2): qty 1

## 2018-06-02 MED ORDER — SODIUM CHLORIDE 0.9% FLUSH
3.0000 mL | Freq: Two times a day (BID) | INTRAVENOUS | Status: DC
  Administered 2018-06-02: 3 mL via INTRAVENOUS

## 2018-06-02 MED ORDER — OXYCODONE HCL 5 MG PO TABS
5.0000 mg | ORAL_TABLET | ORAL | Status: DC | PRN
  Administered 2018-06-03: 5 mg via ORAL
  Filled 2018-06-02: qty 1

## 2018-06-02 MED ORDER — ACETAMINOPHEN 500 MG PO TABS
1000.0000 mg | ORAL_TABLET | Freq: Four times a day (QID) | ORAL | Status: DC
  Administered 2018-06-02 – 2018-06-03 (×3): 1000 mg via ORAL
  Filled 2018-06-02 (×3): qty 2

## 2018-06-02 MED ORDER — EPHEDRINE 5 MG/ML INJ
INTRAVENOUS | Status: AC
  Filled 2018-06-02: qty 10

## 2018-06-02 MED ORDER — PROPOFOL 10 MG/ML IV BOLUS
INTRAVENOUS | Status: DC | PRN
  Administered 2018-06-02: 140 mg via INTRAVENOUS

## 2018-06-02 MED ORDER — LIDOCAINE 2% (20 MG/ML) 5 ML SYRINGE
INTRAMUSCULAR | Status: DC | PRN
  Administered 2018-06-02: 100 mg via INTRAVENOUS

## 2018-06-02 MED ORDER — PHENYLEPHRINE 40 MCG/ML (10ML) SYRINGE FOR IV PUSH (FOR BLOOD PRESSURE SUPPORT)
PREFILLED_SYRINGE | INTRAVENOUS | Status: AC
  Filled 2018-06-02: qty 10

## 2018-06-02 MED ORDER — ONDANSETRON HCL 4 MG/2ML IJ SOLN: 4.0000 mg | Freq: Four times a day (QID) | INTRAMUSCULAR | Status: DC | PRN

## 2018-06-02 MED ORDER — EPHEDRINE SULFATE-NACL 50-0.9 MG/10ML-% IV SOSY
PREFILLED_SYRINGE | INTRAVENOUS | Status: DC | PRN
  Administered 2018-06-02: 5 mg via INTRAVENOUS

## 2018-06-02 MED ORDER — NITROGLYCERIN 0.4 MG SL SUBL: 0.4000 mg | SUBLINGUAL_TABLET | SUBLINGUAL | Status: DC | PRN

## 2018-06-02 MED ORDER — AMLODIPINE BESYLATE 2.5 MG PO TABS
2.5000 mg | ORAL_TABLET | Freq: Every day | ORAL | Status: DC
  Filled 2018-06-02: qty 1

## 2018-06-02 MED ORDER — POTASSIUM CHLORIDE CRYS ER 20 MEQ PO TBCR
40.0000 meq | EXTENDED_RELEASE_TABLET | Freq: Every day | ORAL | Status: DC
  Administered 2018-06-02: 40 meq via ORAL
  Filled 2018-06-02 (×3): qty 2

## 2018-06-02 MED ORDER — EZETIMIBE 10 MG PO TABS
10.0000 mg | ORAL_TABLET | Freq: Every day | ORAL | Status: DC
  Filled 2018-06-02: qty 1

## 2018-06-02 MED ORDER — PHENYLEPHRINE 40 MCG/ML (10ML) SYRINGE FOR IV PUSH (FOR BLOOD PRESSURE SUPPORT)
PREFILLED_SYRINGE | INTRAVENOUS | Status: DC | PRN
  Administered 2018-06-02: 120 ug via INTRAVENOUS
  Administered 2018-06-02: 80 ug via INTRAVENOUS

## 2018-06-02 MED ORDER — DEXAMETHASONE SODIUM PHOSPHATE 10 MG/ML IJ SOLN
INTRAMUSCULAR | Status: DC | PRN
  Administered 2018-06-02 (×2): 10 mg via INTRAVENOUS

## 2018-06-02 MED ORDER — VANCOMYCIN HCL IN DEXTROSE 1-5 GM/200ML-% IV SOLN
1000.0000 mg | INTRAVENOUS | Status: AC
  Administered 2018-06-02: 1000 mg via INTRAVENOUS

## 2018-06-02 MED ORDER — ROCURONIUM BROMIDE 50 MG/5ML IV SOSY
PREFILLED_SYRINGE | INTRAVENOUS | Status: AC
  Filled 2018-06-02: qty 5

## 2018-06-02 MED ORDER — CYCLOBENZAPRINE HCL 10 MG PO TABS: 10.0000 mg | ORAL_TABLET | Freq: Three times a day (TID) | ORAL | Status: DC | PRN

## 2018-06-02 MED ORDER — LACTATED RINGERS IV SOLN
INTRAVENOUS | Status: DC
  Administered 2018-06-02 (×2): via INTRAVENOUS

## 2018-06-02 MED ORDER — VANCOMYCIN HCL 1000 MG IV SOLR
INTRAVENOUS | Status: AC
  Filled 2018-06-02: qty 1000

## 2018-06-02 MED ORDER — VANCOMYCIN HCL IN DEXTROSE 1-5 GM/200ML-% IV SOLN
INTRAVENOUS | Status: AC
  Administered 2018-06-02: 1000 mg via INTRAVENOUS
  Filled 2018-06-02: qty 200

## 2018-06-02 SURGICAL SUPPLY — 64 items
BAG DECANTER FOR FLEXI CONT (MISCELLANEOUS) ×2 IMPLANT
BENZOIN TINCTURE PRP APPL 2/3 (GAUZE/BANDAGES/DRESSINGS) ×2 IMPLANT
BUR MATCHSTICK NEURO 3.0 LAGG (BURR) ×2 IMPLANT
BUR PRECISION FLUTE 6.0 (BURR) ×2 IMPLANT
CAGE ALTERA 10X31X9-13 15D (Cage) ×2 IMPLANT
CANISTER SUCT 3000ML PPV (MISCELLANEOUS) ×2 IMPLANT
CAP REVERE LOCKING (Cap) ×8 IMPLANT
CARTRIDGE OIL MAESTRO DRILL (MISCELLANEOUS) ×1 IMPLANT
CONT SPEC 4OZ CLIKSEAL STRL BL (MISCELLANEOUS) ×2 IMPLANT
COVER BACK TABLE 60X90IN (DRAPES) ×2 IMPLANT
DIFFUSER DRILL AIR PNEUMATIC (MISCELLANEOUS) ×2 IMPLANT
DRAPE C-ARM 42X72 X-RAY (DRAPES) ×4 IMPLANT
DRAPE HALF SHEET 40X57 (DRAPES) ×2 IMPLANT
DRAPE LAPAROTOMY 100X72X124 (DRAPES) ×2 IMPLANT
DRAPE SURG 17X23 STRL (DRAPES) ×8 IMPLANT
DRSG OPSITE POSTOP 4X6 (GAUZE/BANDAGES/DRESSINGS) ×2 IMPLANT
ELECT BLADE 4.0 EZ CLEAN MEGAD (MISCELLANEOUS) ×2
ELECT REM PT RETURN 9FT ADLT (ELECTROSURGICAL) ×2
ELECTRODE BLDE 4.0 EZ CLN MEGD (MISCELLANEOUS) ×1 IMPLANT
ELECTRODE REM PT RTRN 9FT ADLT (ELECTROSURGICAL) ×1 IMPLANT
EVACUATOR 1/8 PVC DRAIN (DRAIN) IMPLANT
GAUZE 4X4 16PLY RFD (DISPOSABLE) ×2 IMPLANT
GAUZE SPONGE 4X4 12PLY STRL (GAUZE/BANDAGES/DRESSINGS) ×2 IMPLANT
GLOVE BIOGEL PI IND STRL 7.0 (GLOVE) ×1 IMPLANT
GLOVE BIOGEL PI IND STRL 7.5 (GLOVE) ×1 IMPLANT
GLOVE BIOGEL PI IND STRL 8 (GLOVE) ×1 IMPLANT
GLOVE BIOGEL PI INDICATOR 7.0 (GLOVE) ×1
GLOVE BIOGEL PI INDICATOR 7.5 (GLOVE) ×1
GLOVE BIOGEL PI INDICATOR 8 (GLOVE) ×1
GLOVE SS N UNI LF 7.0 STRL (GLOVE) ×4 IMPLANT
GLOVE SS N UNI LF 7.5 STRL (GLOVE) ×4 IMPLANT
GLOVE SS N UNI LF 8.0 STRL (GLOVE) ×4 IMPLANT
GLOVE SS N UNI LF 8.5 STRL (GLOVE) ×4 IMPLANT
GOWN STRL REUS W/ TWL LRG LVL3 (GOWN DISPOSABLE) ×2 IMPLANT
GOWN STRL REUS W/ TWL XL LVL3 (GOWN DISPOSABLE) ×2 IMPLANT
GOWN STRL REUS W/TWL 2XL LVL3 (GOWN DISPOSABLE) ×2 IMPLANT
GOWN STRL REUS W/TWL LRG LVL3 (GOWN DISPOSABLE) ×4
GOWN STRL REUS W/TWL XL LVL3 (GOWN DISPOSABLE) ×4
HEMOSTAT POWDER KIT SURGIFOAM (HEMOSTASIS) ×2 IMPLANT
KIT BASIN OR (CUSTOM PROCEDURE TRAY) ×2 IMPLANT
KIT TURNOVER KIT B (KITS) ×2 IMPLANT
MILL MEDIUM DISP (BLADE) ×2 IMPLANT
NEEDLE HYPO 21X1.5 SAFETY (NEEDLE) IMPLANT
NEEDLE HYPO 22GX1.5 SAFETY (NEEDLE) ×2 IMPLANT
NS IRRIG 1000ML POUR BTL (IV SOLUTION) ×2 IMPLANT
OIL CARTRIDGE MAESTRO DRILL (MISCELLANEOUS) ×2
PACK LAMINECTOMY NEURO (CUSTOM PROCEDURE TRAY) ×2 IMPLANT
PAD ARMBOARD 7.5X6 YLW CONV (MISCELLANEOUS) ×6 IMPLANT
PATTIES SURGICAL .5 X1 (DISPOSABLE) IMPLANT
ROD REVERE 6.35 40MM (Rod) ×4 IMPLANT
SCREW 7.5X45MM (Screw) ×4 IMPLANT
SCREW 7.5X50MM (Screw) ×4 IMPLANT
SPONGE LAP 4X18 RFD (DISPOSABLE) IMPLANT
SPONGE NEURO XRAY DETECT 1X3 (DISPOSABLE) IMPLANT
SPONGE SURGIFOAM ABS GEL 100 (HEMOSTASIS) IMPLANT
STRIP BIOACTIVE 20CC 25X100X8 (Miscellaneous) ×2 IMPLANT
STRIP CLOSURE SKIN 1/2X4 (GAUZE/BANDAGES/DRESSINGS) ×2 IMPLANT
SUT VIC AB 1 CT1 18XBRD ANBCTR (SUTURE) ×2 IMPLANT
SUT VIC AB 1 CT1 8-18 (SUTURE) ×4
SUT VIC AB 2-0 CP2 18 (SUTURE) ×4 IMPLANT
SYR 20CC LL (SYRINGE) IMPLANT
TOWEL GREEN STERILE (TOWEL DISPOSABLE) ×2 IMPLANT
TOWEL GREEN STERILE FF (TOWEL DISPOSABLE) ×2 IMPLANT
WATER STERILE IRR 1000ML POUR (IV SOLUTION) ×2 IMPLANT

## 2018-06-02 NOTE — Progress Notes (Signed)
OR to call when time to start vancomycin

## 2018-06-02 NOTE — Plan of Care (Signed)
  Problem: Safety: Goal: Ability to remain free from injury will improve Outcome: Progressing   

## 2018-06-02 NOTE — H&P (Signed)
Subjective: The patient is an 84 year old white female on whom I performed an L4-5 discectomy.  She has developed recurrent back and leg pain.  She has failed medical management.  She was worked up with a lumbar MRI which demonstrated L4-5 degenerative disease and a recurrent herniated disc.  I discussed the various treatment options with the patient including surgery.  She has weighed the risks, benefits, and alternatives surgery and decided proceed with an L4-5 decompression, instrumentation, and fusion.  Past Medical History:  Diagnosis Date  . Arthritis    right knee  . Cancer [facility]) yrs ago   skin cancer removed from face  . Chronic diastolic CHF (congestive heart failure) ([place])   . Chronic venous insufficiency    LEA VENOUS, 10/17/2011 - mild reflux in bilateral common femoral veins  . CKD (chronic kidney disease), stage III ([place])   . Coronary artery disease    a. s/p PCI/BMS to prox LAD and balloon angioplasty to mLAD with suboptimal result in 2000. b. Abnl nuc 2012, cath 09/2011 - showed that the overall territory of potential ischemia was small and attributable to a moderately diseased small second diagonal artery. Med rx.  Marland Kitchen Dyspnea    with activity  . Headache   . History of blood transfusion 2017  . Hyperlipidemia   . Hypertension   . Hypertensive heart disease   . Hypothyroidism   . Obesity   . Pneumonia    several times  . Stroke [facility])    pt. states she had "light stroke" in sept. 1980  . Urinary incontinence     Past Surgical History:  Procedure Laterality Date  . ABDOMINAL HYSTERECTOMY  1970's   complete  . ANKLE ARTHROSCOPY Right 07/10/2017   Procedure: ANKLE ARTHROSCOPY;  Surgeon: [name], DPM;  Location: [place];  Service: Podiatry;  Laterality: Right;  . BACK SURGERY  07/26/2016   cervical neck surgery, [place] [facility]  . BLADDER SURGERY  2010   WITH MESH  bladder tach  . bunion removal surgery Bilateral 15 yrs ago  . CARDIAC CATHETERIZATION  Left 09/25/2011   Medical management  . CARDIAC CATHETERIZATION Left 03/25/2001   Normal LV function, LAD residual narrowing of less than 10%, normal ramus intermediate, circumflex, and RCA,   . CARDIAC CATHETERIZATION  09/04/1999   LAD, 3x28mm Tetra stent resulting in a reduction of the 80% stenosis to 0% residual  . CARDIAC CATHETERIZATION N/A 01/26/2015   Procedure: Right/Left Heart Cath and Coronary Angiography;  Surgeon: Troy Sine, MD;  Location: [place] CV LAB;  Service: Cardiovascular;  Laterality: N/A;  . CARDIAC CATHETERIZATION N/A 01/27/2015   Procedure: Intravascular Pressure Wire/FFR Study;  Surgeon: Burnell Blanks, MD;  Location: [place] CV LAB;  Service: Cardiovascular;  Laterality: N/A;  . CARDIAC CATHETERIZATION N/A 01/27/2015   Procedure: Right Heart Cath;  Surgeon: Burnell Blanks, MD;  Location: [place] CV LAB;  Service: Cardiovascular;  Laterality: N/A;  . CHOLECYSTECTOMY    . COLONOSCOPY WITH PROPOFOL N/A 03/07/2017   Procedure: COLONOSCOPY WITH PROPOFOL;  Surgeon: Juanita Craver, MD;  [facility];  Service: Endoscopy;  Laterality: N/A;  . CORONARY STENT PLACEMENT     LAD x 1  . ganglion cyst removal  yrs ago   x 2  . ILIAC VEIN ANGIOPLASTY / STENTING  02/15/2015  . INTRAVASCULAR PRESSURE WIRE/FFR STUDY N/A 04/05/2017   Procedure: Intravascular Pressure Wire/FFR Study;  Surgeon: Martinique, Peter M, MD;  Location: [place] CV LAB;  Service: Cardiovascular;  Laterality: N/A;  . IVC FILTER INSERTION  2016  . IVC FILTER REMOVAL  02/15/2015   at [facility]  . LEFT HEART CATH AND CORONARY ANGIOGRAPHY N/A 04/05/2017   Procedure: Left Heart Cath and Coronary Angiography;  Surgeon: Martinique, Peter M, MD;  Location: [place] CV LAB;  Service: Cardiovascular;  Laterality: N/A;  . LEFT HEART CATHETERIZATION WITH CORONARY ANGIOGRAM N/A 09/25/2011   Procedure: LEFT HEART CATHETERIZATION WITH CORONARY ANGIOGRAM;  Surgeon: Sanda Klein, MD;  Location: [place] CATH  LAB;  Service: Cardiovascular;  Laterality: N/A;  . multiple bladder surgeries to remove mesh    . ROTATOR CUFF REPAIR Right   . stent to groin Left 08/2014   left leg  . TENDON REPAIR Right 07/10/2017   Procedure: RIGHT PERONEAL TENDON REPAIR;  Surgeon: [name], DPM;  Location: [place];  Service: Podiatry;  Laterality: Right;    Allergies  Allergen Reactions  . Atorvastatin Anaphylaxis    Patient does not remember; caused pneumonia- took her off of it per patient.   . Penicillins Anaphylaxis and Hives    PATIENT HAS HAD A PCN REACTION WITH IMMEDIATE RASH, FACIAL/TONGUE/THROAT SWELLING, SOB, OR LIGHTHEADEDNESS WITH HYPOTENSION:  #  #  #  YES  #  #  #   HAS PT DEVELOPED SEVERE RASH INVOLVING MUCUS MEMBRANES or SKIN NECROSIS: #  #  #  YES  #  #  #   PATIENT HAS HAD A PCN REACTION THAT REQUIRED HOSPITALIZATION:  #  #  #  YES  #  #  #  Has patient had a PCN reaction occurring within the last 10 years:#  #  #  YES  #  #  #    . Shellfish Allergy Anaphylaxis and Nausea And Vomiting    Severe nausea and vomiting  . Aminophylline Itching, Swelling and Rash    Pt experienced burning urination, itching, redness/rash, and swelling of genitals after given this medication via IV push.  . Eggs Or Egg-Derived Products Other (See Comments)    Causes eye problems "causes eye problems"  . Iodinated Diagnostic Agents Swelling and Other (See Comments)    SWELLING REACTION UNSPECIFIED  fFLUSHING  . Latex Other (See Comments)    Causes blisters  . Egg White (Diagnostic) Other (See Comments)    "causes eye problems"  . Oxybutynin Chloride Other (See Comments)    "blisters" "blisters"  . Adhesive [Tape] Rash    Paper tape is ok  . Betadine [Povidone Iodine] Rash  . Ciprofloxacin Hives  . Codeine Nausea And Vomiting    .  Marland Kitchen Gelnique [Oxybutynin] Other (See Comments)    Causes blisters  . Ranolazine Other (See Comments)    Constipation  .    Social History   Tobacco Use  . Smoking  status: Never Smoker  . Smokeless tobacco: Never Used  Substance Use Topics  . Alcohol use: No    Family History  Problem Relation Age of Onset  . Cancer Mother   . Heart disease Father   . Heart disease Sister   . Heart disease Brother    Prior to Admission medications   Medication Sig Start Date End Date Taking? Authorizing Provider  acetaminophen (TYLENOL) 500 MG tablet Take 500-1,000 mg by mouth every 6 (six) hours as needed (for pain.).    Yes [provider]  amLODipine (NORVASC) 2.5 MG tablet Take 1 tablet (2.5 mg total) by mouth daily. 05/14/18 08/12/18 Yes Barrett, Evelene Croon, PA-C  bisacodyl (DULCOLAX) 5 MG EC tablet Take 5 mg by mouth 2 (two) times daily.   Yes [provider]  Coenzyme Q10 (COQ10 PO) Take 2 capsules by mouth daily.   Yes [provider]  ezetimibe (ZETIA) 10 MG tablet TAKE 1 TABLET BY MOUTH EVERY DAY Patient taking differently: Take 10 mg by mouth daily.  05/08/18  Yes Croitoru, Mihai, MD  furosemide (LASIX) 80 MG tablet Take 1 tablet (80 mg total) by mouth daily. 02/14/18  Yes Croitoru, Mihai, MD  isosorbide mononitrate (IMDUR) 30 MG 24 hr tablet TAKE 2 TABLETS TWICE A DAY Patient taking differently: Take 60 mg by mouth 2 (two) times daily.  03/03/18  Yes Leonie Man, MD  KLOR-CON M20 20 MEQ tablet Take 40 mEq by mouth daily. 05/11/18  Yes [provider]  levothyroxine (SYNTHROID, LEVOTHROID) 112 MCG tablet Take 112 mcg by mouth at bedtime.  05/15/18  Yes [provider]  Multiple Vitamin (MULTIVITAMIN WITH MINERALS) TABS tablet Take 1 tablet by mouth daily. Centrum 50+    Yes [provider]  nitroGLYCERIN (NITROSTAT) 0.4 MG SL tablet Place 1 tablet (0.4 mg total) under the tongue every 5 (five) minutes as needed for chest pain. 11/15/16  Yes Croitoru, Mihai, MD  pravastatin (PRAVACHOL) 80 MG tablet Take 80 mg by mouth at bedtime.   Yes [provider]  vitamin C (ASCORBIC ACID) 500 MG tablet Take  500 mg by mouth daily.    Yes [provider]  aspirin EC 81 MG tablet Take 1 tablet (81 mg total) by mouth daily. 02/26/13   Erlene Quan, PA-C  losartan-hydrochlorothiazide (HYZAAR) 100-12.5 MG tablet Take 1 tablet by mouth daily. Patient not taking: Reported on 05/29/2018 03/19/18   Croitoru, Dani Gobble, MD     Review of Systems  Positive ROS: As above  All other systems have been reviewed and were otherwise negative with the exception of those mentioned in the HPI and as above.  Objective: Vital signs in last 24 hours: Temp:  [97.4 F (36.3 C)] 97.4 F (36.3 C) (09/16 1035) BP: (161)/(59) 161/59 (09/16 1035) SpO2:  [98 %] 98 % (09/16 1035) Weight:  [93 kg] 93 kg (09/16 1035) Estimated body mass index is 33.59 kg/m as calculated from the following:   Height as of this encounter: 5' 5.5" (1.664 m).   Weight as of this encounter: 93 kg.   General Appearance: Alert Head: Normocephalic, without obvious abnormality, atraumatic Eyes: PERRL, conjunctiva/corneas clear, EOM's intact,    Ears: Normal  Throat: Normal  Neck: Supple, Back: The patient's lumbar incision is well-healed. Lungs: Clear to auscultation bilaterally, respirations unlabored Heart: Regular rate and rhythm, no murmur, rub or gallop Abdomen: Soft, non-tender Extremities: Extremities normal, atraumatic, no cyanosis or edema Skin: unremarkable  NEUROLOGIC:   Mental status: alert and oriented,Motor Exam - grossly normal Sensory Exam - grossly normal Reflexes:  Coordination - grossly normal Gait - grossly normal Balance - grossly normal Cranial Nerves: I: smell Not tested  II: visual acuity  OS: Normal  OD: Normal   II: visual fields Full to confrontation  II: pupils Equal, round, reactive to light  III,VII: ptosis None  III,IV,VI: extraocular muscles  Full ROM  V: mastication Normal  V: facial light touch sensation  Normal  V,VII: corneal reflex  Present  VII: facial muscle function - upper  Normal   VII: facial muscle function - lower Normal  VIII: hearing Not tested  IX: soft palate elevation  Normal  IX,X: gag reflex Present  XI: trapezius strength  5/5  XI: sternocleidomastoid strength 5/5  XI: neck flexion strength  5/5  XII: tongue strength  Normal    Data Review Lab Results  Component Value Date   WBC 6.6 03/26/2018   HGB 15.5 03/26/2018   HCT 46.5 03/26/2018   MCV 97 03/26/2018   PLT 222 03/26/2018   Lab Results  Component Value Date   NA 141 03/26/2018   K 4.0 03/26/2018   CL 95 (L) 03/26/2018   CO2 29 03/26/2018   BUN 19 03/26/2018   CREATININE 0.90 03/26/2018   GLUCOSE 96 03/26/2018   Lab Results  Component Value Date   INR 1.0 03/27/2017    Assessment/Plan: L4-5 degenerative disc disease, herniated disc, spinal stenosis, lumbago, lumbar radiculopathy: I have discussed the situation with the patient and her husband.  I reviewed the imaging studies with them and pointed out the abnormalities.  We have discussed the various treatment options including surgery.  I have described the surgical treatment option of an L4-5 decompression, instrumentation, and fusion.  I have shown her surgical models.  I have given her surgical pamphlet.  We have discussed the risks, benefits, alternatives, expected postoperative course, and likelihood of achieving our goals with surgery.  I have answered all their questions.  She has decided to proceed with surgery.   Ophelia Charter 06/02/2018 10:49 AM

## 2018-06-02 NOTE — Progress Notes (Signed)
Patient called stating she was itching on her left arm. Hive noted on left arm. Vancomycin rate initially decreased. Patient developed hive to right chest and back. Two spots noted on back and one spot noted on right chest area. Vancomycin stopped. Dr. Nyoka Cowden and Dr. Arnoldo Morale notified.

## 2018-06-02 NOTE — Anesthesia Preprocedure Evaluation (Signed)
Anesthesia Evaluation  Patient identified by MRN, date of birth, ID band Patient awake    Reviewed: Allergy & Precautions, NPO status   Airway Mallampati: II  TM Distance: >3 FB     Dental   Pulmonary shortness of breath, pneumonia,    breath sounds clear to auscultation       Cardiovascular hypertension, + angina + Past MI, + Peripheral Vascular Disease and +CHF   Rhythm:Regular Rate:Normal     Neuro/Psych  Headaches,    GI/Hepatic negative GI ROS, Neg liver ROS,   Endo/Other  Hypothyroidism   Renal/GU Renal disease     Musculoskeletal  (+) Arthritis ,   Abdominal   Peds  Hematology   Anesthesia Other Findings   Reproductive/Obstetrics                             Anesthesia Physical Anesthesia Plan  ASA: III  Anesthesia Plan: General   Post-op Pain Management:    Induction: Intravenous  PONV Risk Score and Plan: Ondansetron, Dexamethasone, Midazolam and Treatment may vary due to age or medical condition  Airway Management Planned: Oral ETT  Additional Equipment:   Intra-op Plan:   Post-operative Plan: Possible Post-op intubation/ventilation  Informed Consent: I have reviewed the patients History and Physical, chart, labs and discussed the procedure including the risks, benefits and alternatives for the proposed anesthesia with the patient or authorized representative who has indicated his/her understanding and acceptance.   Dental advisory given  Plan Discussed with: CRNA and Anesthesiologist  Anesthesia Plan Comments:         Anesthesia Quick Evaluation

## 2018-06-02 NOTE — Anesthesia Procedure Notes (Signed)
Procedure Name: Intubation Date/Time: 06/02/2018 1:31 PM Performed by: Kyung Rudd, CRNA Pre-anesthesia Checklist: Patient identified, Emergency Drugs available, Suction available, Patient being monitored and Timeout performed Patient Re-evaluated:Patient Re-evaluated prior to induction Oxygen Delivery Method: Circle system utilized Preoxygenation: Pre-oxygenation with 100% oxygen Induction Type: IV induction Ventilation: Mask ventilation without difficulty Laryngoscope Size: Mac and 3 Grade View: Grade I Tube type: Oral Tube size: 7.0 mm Number of attempts: 1 Airway Equipment and Method: Stylet Placement Confirmation: ETT inserted through vocal cords under direct vision,  positive ETCO2 and breath sounds checked- equal and bilateral Secured at: 21 cm Tube secured with: Tape Dental Injury: Teeth and Oropharynx as per pre-operative assessment

## 2018-06-02 NOTE — Transfer of Care (Signed)
Immediate Anesthesia Transfer of Care Note  Patient: Jill Preston  Procedure(s) Performed: POSTERIOR LUMBAR INTERBODY FUSION, INTERVERTEBRAL PROSTHESIS,POSTERIOR INSTRUMENTATION AND FUSION LUMBAR FOUR- LUMBAR FIVE (N/A Spine Lumbar)  Patient Location: PACU  Anesthesia Type:General  Level of Consciousness: awake and drowsy  Airway & Oxygen Therapy: Patient Spontanous Breathing and Patient connected to nasal cannula oxygen  Post-op Assessment: Report given to RN, Post -op Vital signs reviewed and stable and Patient moving all extremities X 4  Post vital signs: Reviewed and stable  Last Vitals:  Vitals Value Taken Time  BP 139/62 06/02/2018  5:05 PM  Temp 36.4 C 06/02/2018  5:05 PM  Pulse 86 06/02/2018  5:10 PM  Resp 13 06/02/2018  5:10 PM  SpO2 96 % 06/02/2018  5:10 PM  Vitals shown include unvalidated device data.  Last Pain:  Vitals:   06/02/18 1125  TempSrc:   PainSc: 4       Patients Stated Pain Goal: 4 (15/18/34 3735)  Complications: No apparent anesthesia complications

## 2018-06-02 NOTE — Op Note (Signed)
Brief history: The patient is an 84 year old white female on whom I previously performed an L4-5 discectomy.  He is developed recurrent back and leg pain.  She has failed medical management.  She was worked up with a lumbar MRI which demonstrated L4-5 disc degeneration and recurrent ruptured disc and foraminal stenosis.  I discussed the situation with the patient and her husband.  She has weighed the risks, benefits, and alternatives of surgery and decided proceed with a L4-5 decompression, instrumentation, and fusion.  Preoperative diagnosis: L4-5 recurrent herniated disc, degenerative disc disease, foraminal stenosis compressing both the L4 and the and L5 nerve roots; lumbago; lumbar radiculopathy  Postoperative diagnosis: The same  Procedure: Bilateral L4-5 laminotomy/foraminotomies to decompress the bilateral L4 and L5 nerve roots(the work required to do this was in addition to the work required to do the posterior lumbar interbody fusion because of the patient's foraminal stenosis, recurrent herniated disc. Etc. requiring a wide decompression of the nerve roots.);  L4-5 transforaminal lumbar interbody fusion with local morselized autograft bone and Kinnex graft extender; insertion of interbody prosthesis at L4-5 (globus peek expandable interbody prosthesis); posterior nonsegmental instrumentation from L4 to L5 with globus titanium pedicle screws and rods; posterior lateral arthrodesis at L4-5 bilaterally with local morselized autograft bone and Kinnex bone graft extender.  Surgeon: Dr. Earle Gell  Asst.: Arnetha Massy, NP  Anesthesia: Gen. endotracheal  Estimated blood loss: 200 cc  Drains: None  Complications: None  Description of procedure: The patient was brought to the operating room by the anesthesia team. General endotracheal anesthesia was induced. The patient was turned to the prone position on the Wilson frame. The patient's lumbosacral region was then prepared with Betadine scrub  and Betadine solution. Sterile drapes were applied.  I then injected the area to be incised with Marcaine with epinephrine solution. I then used the scalpel to make a linear midline incision over the L4-5 interspace, incising through the old surgical scar. I then used electrocautery to perform a bilateral subperiosteal dissection exposing the spinous process and lamina of L4 and L5 bilaterally. We then obtained intraoperative radiograph to confirm our location. We then inserted the Verstrac retractor to provide exposure.  I began the decompression by using the high speed drill to perform laminotomies at L4-5 bilaterally. We then used the Kerrison punches to widen the laminotomy and removed the ligamentum flavum at L4-5 on the left and the scar tissue on the right. We used the Kerrison punches to remove the medial facets at L4-5 bilaterally. We performed wide foraminotomies about the bilateral L4 and L5 nerve roots completing the decompression.  We now turned our attention to the posterior lumbar interbody fusion. I used a scalpel to incise the intervertebral disc at L4-5 bilaterally. I then performed a partial intervertebral discectomy at L4-5 bilaterally using the pituitary forceps. We prepared the vertebral endplates at C6-2 bilaterally for the fusion by removing the soft tissues with the curettes. We then used the trial spacers to pick the appropriate sized interbody prosthesis. We prefilled his prosthesis with a combination of local morselized autograft bone that we obtained during the decompression as well as Kinnex bone graft extender. We inserted the prefilled prosthesis into the interspace at L4-5 from the right, we then expanded the prosthesis. There was a good snug fit of the prosthesis in the interspace. We then filled and the remainder of the intervertebral disc space with local morselized autograft bone and Kinnex. This completed the posterior lumbar interbody arthrodesis.  We now turned  attention to the instrumentation. Under fluoroscopic guidance we cannulated the bilateral L4 and L5 pedicles with the bone probe. We then removed the bone probe. We then tapped the pedicle with a 6.5 millimeter tap. We then removed the tap. We probed inside the tapped pedicle with a ball probe to rule out cortical breaches. We then inserted a 7.5 x 45 and 50 millimeter pedicle screw into the L4 and L5 pedicles bilaterally under fluoroscopic guidance. We then palpated along the medial aspect of the pedicles to rule out cortical breaches. There were none. The nerve roots were not injured. We then connected the unilateral pedicle screws with a lordotic rod. We compressed the construct and secured the rod in place with the caps. We then tightened the caps appropriately. This completed the instrumentation from L4-5 bilaterally.  We now turned our attention to the posterior lateral arthrodesis at L4-5 bilaterally. We used the high-speed drill to decorticate the remainder of the facets, pars, transverse process at L4-5 bilaterally. We then applied a combination of local morselized autograft bone and Kinnex bone graft extender over these decorticated posterior lateral structures. This completed the posterior lateral arthrodesis.  We then obtained hemostasis using bipolar electrocautery. We irrigated the wound out with bacitracin solution. We inspected the thecal sac and nerve roots and noted they were well decompressed. We then removed the retractor.  We reapproximated patient's thoracolumbar fascia with interrupted #1 Vicryl suture. We reapproximated patient's subcutaneous tissue with interrupted 2-0 Vicryl suture. The reapproximated patient's skin with Steri-Strips and benzoin. The wound was then coated with bacitracin ointment. A sterile dressing was applied. The drapes were removed. The patient was subsequently returned to the supine position where they were extubated by the anesthesia team. He was then transported  to the post anesthesia care unit in stable condition. All sponge instrument and needle counts were reportedly correct at the end of this case.

## 2018-06-02 NOTE — Anesthesia Postprocedure Evaluation (Signed)
Anesthesia Post Note  Patient: Jill Preston  Procedure(s) Performed: POSTERIOR LUMBAR INTERBODY FUSION, INTERVERTEBRAL PROSTHESIS,POSTERIOR INSTRUMENTATION AND FUSION LUMBAR FOUR- LUMBAR FIVE (N/A Spine Lumbar)     Patient location during evaluation: PACU Anesthesia Type: General Level of consciousness: awake Pain management: pain level controlled Vital Signs Assessment: post-procedure vital signs reviewed and stable Respiratory status: spontaneous breathing Cardiovascular status: stable Postop Assessment: no apparent nausea or vomiting Anesthetic complications: no    Last Vitals:  Vitals:   06/02/18 1720 06/02/18 1735  BP: 122/72 127/68  Pulse: 85 82  Resp: 14 17  Temp:    SpO2: 96% 92%    Last Pain:  Vitals:   06/02/18 1749  TempSrc:   PainSc: 5                  Julena Barbour

## 2018-06-03 LAB — BASIC METABOLIC PANEL
Anion gap: 10 (ref 5–15)
BUN: 23 mg/dL (ref 8–23)
CO2: 26 mmol/L (ref 22–32)
Calcium: 8.7 mg/dL — ABNORMAL LOW (ref 8.9–10.3)
Chloride: 101 mmol/L (ref 98–111)
Creatinine, Ser: 1.26 mg/dL — ABNORMAL HIGH (ref 0.44–1.00)
GFR calc Af Amer: 45 mL/min — ABNORMAL LOW (ref >60)
GFR calc non Af Amer: 39 mL/min — ABNORMAL LOW (ref >60)
Glucose, Bld: 131 mg/dL — ABNORMAL HIGH (ref 70–99)
Potassium: 3.8 mmol/L (ref 3.5–5.1)
Sodium: 137 mmol/L (ref 135–145)

## 2018-06-03 LAB — CBC
HCT: 39.8 % (ref 36.0–46.0)
Hemoglobin: 13.1 g/dL (ref 12.0–15.0)
MCH: 31.7 pg (ref 26.0–34.0)
MCHC: 32.9 g/dL (ref 30.0–36.0)
MCV: 96.4 fL (ref 78.0–100.0)
Platelets: 165 10*3/uL (ref 150–400)
RBC: 4.13 MIL/uL (ref 3.87–5.11)
RDW: 12.8 % (ref 11.5–15.5)
WBC: 13 10*3/uL — ABNORMAL HIGH (ref 4.0–10.5)

## 2018-06-03 MED ORDER — CYCLOBENZAPRINE HCL 5 MG PO TABS
5.0000 mg | ORAL_TABLET | Freq: Three times a day (TID) | ORAL | Status: DC | PRN
  Administered 2018-06-03: 5 mg via ORAL
  Filled 2018-06-03: qty 1

## 2018-06-03 MED ORDER — OXYCODONE HCL 5 MG PO TABS: 5.0000 mg | ORAL_TABLET | ORAL | 0 refills | Status: DC | PRN

## 2018-06-03 MED ORDER — CYCLOBENZAPRINE HCL 5 MG PO TABS: 5.0000 mg | ORAL_TABLET | Freq: Three times a day (TID) | ORAL | 1 refills | Status: DC | PRN

## 2018-06-03 NOTE — Evaluation (Signed)
Occupational Therapy Evaluation Patient Details Name: Jill Preston MRN: [id number] DOB: 09-12-1933 Today's Date: 06/03/2018    History of Present Illness Pt is a 84 y.o. female s/p PLIF @ L4-5. PMH significant for CHF, CKD stage 3, CVI, CAN, HTN, and HLD.   Clinical Impression   PTA, pt was living with her husband and was independent. Currently, pt requires Supervision-Min Guard A for ADLs and functional mobility using RW. Provided education and handout on back precautions, bed mobility, brace management, LB ADLs with AE, toileting with AE, and tub/shower transfer; pt demonstrated and verbalized understanding. Answered all pt questions. Recommend dc home once medically stable per physician. All acute OT needs met and will sign off. Thank you.     Follow Up Recommendations  No OT follow up;Supervision/Assistance - 24 hour    Equipment Recommendations  None recommended by OT    Recommendations for Other Services PT consult     Precautions / Restrictions Precautions Precautions: Back;Fall Precaution Booklet Issued: Yes (comment) Precaution Comments: Reviewed precautions in full with pt.  Required Braces or Orthoses: Spinal Brace Spinal Brace: Applied in sitting position;Lumbar corset Restrictions Weight Bearing Restrictions: No      Mobility Bed Mobility Overal bed mobility: Needs Assistance Bed Mobility: Rolling;Sidelying to Sit Rolling: Min guard Sidelying to sit: Min guard;HOB elevated       General bed mobility comments: OOB upon arrival. Reviewed log roll technique and pt verbalized understanding  Transfers Overall transfer level: Needs assistance Equipment used: None Transfers: Sit to/from Stand Sit to Stand: Min guard         General transfer comment: Min G for safety.     Balance Overall balance assessment: Mild deficits observed, not formally tested                                         ADL either performed or assessed with  clinical judgement   ADL Overall ADL's : Needs assistance/impaired Eating/Feeding: Set up;Supervision/ safety;Sitting   Grooming: Set up;Supervision/safety;Standing   Upper Body Bathing: Set up;Supervision/ safety;Sitting   Lower Body Bathing: Set up;Supervison/ safety;Sit to/from stand   Upper Body Dressing : Set up;Supervision/safety;Sitting Upper Body Dressing Details (indicate cue type and reason): Pt managing brace without difficulty Lower Body Dressing: Supervision/safety;With caregiver independent assisting;With adaptive equipment;Sit to/from stand Lower Body Dressing Details (indicate cue type and reason): Pt demonstrating understanding of education and donning/doffing socks and pants with supervision and AE. Husband also reporting he is available to assist as needed Toilet Transfer: Min guard;Ambulation(Simulated in room)   Toileting- Clothing Manipulation and Hygiene: Supervision/safety;Sit to/from stand;With caregiver independent assisting;With adaptive equipment Toileting - Clothing Manipulation Details (indicate cue type and reason): educating pt on AE for toilet hygiene and difficult techniques to opimize independence. Pt also reporting husband can assist Tub/ Shower Transfer: Min guard;Ambulation   Functional mobility during ADLs: Min guard;Cane General ADL Comments: Educating pt on back precautions and compensatory techniques for adherance. Pt performing ADLs and functional mobility at supervision-Min Guard A level. Providing education and handout on bed mobility, brace management, LB ADLs with AE, toileting with AE, and tub/shower transfer. Pt demosntrated and verbalized understanding.     Vision         Perception     Praxis      Pertinent Vitals/Pain Pain Assessment: Faces Faces Pain Scale: Hurts a little bit Pain Location: Back Pain Descriptors / Indicators: Pressure  Pain Intervention(s): Monitored during session;Repositioned     Hand Dominance Left    Extremity/Trunk Assessment Upper Extremity Assessment Upper Extremity Assessment: Overall WFL for tasks assessed   Lower Extremity Assessment Lower Extremity Assessment: Defer to PT evaluation   Cervical / Trunk Assessment Cervical / Trunk Assessment: Other exceptions Cervical / Trunk Exceptions: s/p PLIF @ L4-5   Communication Communication Communication: No difficulties   Cognition Arousal/Alertness: Awake/alert Behavior During Therapy: WFL for tasks assessed/performed Overall Cognitive Status: Within Functional Limits for tasks assessed                                 General Comments: Very pleasant and motivated to participate in therapy   General Comments  Husband present throughout session    Exercises     Shoulder Instructions      Home Living Family/patient expects to be discharged to:: Private residence Living Arrangements: Spouse/significant other Available Help at Discharge: Family;Available 24 hours/day Type of Home: House Home Access: Stairs to enter [employer] of Steps: 3(front entrance ) Entrance Stairs-Rails: Can reach both Home Layout: One level     Bathroom Shower/Tub: Walk-in shower;Tub/shower unit   Bathroom Toilet: Handicapped height     Home Equipment: Environmental consultant - 2 wheels;Walker - 4 wheels;Cane - single point;Grab bars - tub/shower;Hand held shower head   Additional Comments: Pt has son available down the street if needed. Master bathroom has tub/shower unit but pt only uses walk-in shower in hallway.Pt reports she has 3 entrances to her home all with steps.      Prior Functioning/Environment Level of Independence: Independent        Comments: Uses SPC when ascended/descending back entrance of house as it has 7 stairs and she can only reach one rail        OT Problem List: Decreased strength;Decreased range of motion;Decreased activity tolerance;Impaired balance (sitting and/or standing);Decreased knowledge of  use of DME or AE;Decreased knowledge of precautions;Pain      OT Treatment/Interventions:      OT Goals(Current goals can be found in the care plan section) Acute Rehab OT Goals Patient Stated Goal: to go home OT Goal Formulation: All assessment and education complete, DC therapy  OT Frequency:     Barriers to D/C:            Co-evaluation              AM-PAC PT "6 Clicks" Daily Activity     Outcome Measure Help from another person eating meals?: None Help from another person taking care of personal grooming?: None Help from another person toileting, which includes using toliet, bedpan, or urinal?: A Little Help from another person bathing (including washing, rinsing, drying)?: A Little Help from another person to put on and taking off regular upper body clothing?: None Help from another person to put on and taking off regular lower body clothing?: A Little 6 Click Score: 21   End of Session Equipment Utilized During Treatment: Back brace Nurse Communication: Mobility status;Precautions  Activity Tolerance: Patient tolerated treatment well Patient left: in chair;with call bell/phone within reach;with family/visitor present  OT Visit Diagnosis: Unsteadiness on feet (R26.81);Other abnormalities of gait and mobility (R26.89);Muscle weakness (generalized) (M62.81);Pain Pain - part of body: (Back)                Time: 9485-4627 OT Time Calculation (min): 28 min Charges:  OT General Charges $OT Visit: 1 Visit OT  Evaluation $OT Eval Low Complexity: 1 Low OT Treatments $Self Care/Home Management : 8-22 mins  Iyad Deroo MSOT, OTR/L Acute Rehab Pager: [phone] Office: [place] 06/03/2018, 9:53 AM

## 2018-06-03 NOTE — Discharge Summary (Signed)
Physician Discharge Summary  Patient ID: Jill Preston MRN: [id number] DOB/AGE: [birth year] 84 y.o.  Admit date: 06/02/2018 Discharge date: 06/03/2018  Admission Diagnoses: Recurrent lumbar herniated disc, lumbago, lumbar radiculopathy  Discharge Diagnoses: The same Active Problems:   Herniated intervertebral disc of lumbar spine   Discharged Condition: good  Hospital Course: I performed an L4-5 decompression, instrumentation and fusion on patient on 06/02/2018.  The surgery went well.  The patient's postoperative course was unremarkable.  On postoperative day #1 she requested discharge home.  She was given written and oral discharge instructions.  All her questions were answered.  Consults: Physical therapy, Occupational Therapy Significant Diagnostic Studies: None Treatments: L4-5 decompression, nstrumentation, and fusion. Discharge Exam: Blood pressure (!) 126/54, pulse 62, temperature 98 F (36.7 C), temperature source Oral, resp. rate 16, height 5' 5.5" (1.664 m), weight 93 kg, SpO2 94 %. The patient is alert and pleasant.  She looks well.  Her strength is normal.  Disposition: Home  Discharge Instructions    Call MD for:  difficulty breathing, headache or visual disturbances   Complete by:  As directed    Call MD for:  extreme fatigue   Complete by:  As directed    Call MD for:  hives   Complete by:  As directed    Call MD for:  persistant dizziness or light-headedness   Complete by:  As directed    Call MD for:  persistant nausea and vomiting   Complete by:  As directed    Call MD for:  redness, tenderness, or signs of infection (pain, swelling, redness, odor or green/yellow discharge around incision site)   Complete by:  As directed    Call MD for:  severe uncontrolled pain   Complete by:  As directed    Call MD for:  temperature >100.4   Complete by:  As directed    Diet - low sodium heart healthy   Complete by:  As directed    Discharge instructions    Complete by:  As directed    Call [phone] for a followup appointment. Take a stool softener while you are using pain medications.   Driving Restrictions   Complete by:  As directed    Do not drive for 2 weeks.   Increase activity slowly   Complete by:  As directed    Lifting restrictions   Complete by:  As directed    Do not lift more than 5 pounds. No excessive bending or twisting.   May shower / Bathe   Complete by:  As directed    Remove the dressing for 3 days after surgery.  You may shower, but leave the incision alone.   Remove dressing in 48 hours   Complete by:  As directed    Your stitches are under the scan and will dissolve by themselves. The Steri-Strips will fall off after you take a few showers. Do not rub back or pick at the wound, Leave the wound alone.     Allergies as of 06/03/2018      Reactions   Atorvastatin Anaphylaxis   Patient does not remember; caused pneumonia- took her off of it per patient.    Penicillins Anaphylaxis, Hives   PATIENT HAS HAD A PCN REACTION WITH IMMEDIATE RASH, FACIAL/TONGUE/THROAT SWELLING, SOB, OR LIGHTHEADEDNESS WITH HYPOTENSION:  #  #  #  YES  #  #  #   HAS PT DEVELOPED SEVERE RASH INVOLVING MUCUS MEMBRANES or SKIN NECROSIS: #  #  #  YES  #  #  #   PATIENT HAS HAD A PCN REACTION THAT REQUIRED HOSPITALIZATION:  #  #  #  YES  #  #  #  Has patient had a PCN reaction occurring within the last 10 years:#  #  #  YES  #  #  #    Shellfish Allergy Anaphylaxis, Nausea And Vomiting   Severe nausea and vomiting   Aminophylline Itching, Swelling, Rash   Pt experienced burning urination, itching, redness/rash, and swelling of genitals after given this medication via IV push.   Eggs Or Egg-derived Products Other (See Comments)   Causes eye problems "causes eye problems"   Iodinated Diagnostic Agents Swelling, Other (See Comments)   SWELLING REACTION UNSPECIFIED  fFLUSHING   Latex Other (See Comments)   Causes blisters   Egg White  (diagnostic) Other (See Comments)   "causes eye problems"   Oxybutynin Chloride Other (See Comments)   "blisters" "blisters"   Vancomycin Hives   06/02/2018- Hives arm, back and chest   Adhesive [tape] Rash   Paper tape is ok   Betadine [povidone Iodine] Rash   Ciprofloxacin Hives   Codeine Nausea And Vomiting   .   Gelnique [oxybutynin] Other (See Comments)   Causes blisters   Ranolazine Other (See Comments)   Constipation .      Medication List    TAKE these medications   acetaminophen 500 MG tablet Commonly known as:  TYLENOL Take 500-1,000 mg by mouth every 6 (six) hours as needed (for pain.).   amLODipine 2.5 MG tablet Commonly known as:  NORVASC Take 1 tablet (2.5 mg total) by mouth daily.   aspirin EC 81 MG tablet Take 1 tablet (81 mg total) by mouth daily.   bisacodyl 5 MG EC tablet Commonly known as:  DULCOLAX Take 5 mg by mouth 2 (two) times daily.   COQ10 PO Take 2 capsules by mouth daily.   cyclobenzaprine 5 MG tablet Commonly known as:  FLEXERIL Take 1 tablet (5 mg total) by mouth 3 (three) times daily as needed for muscle spasms.   ezetimibe 10 MG tablet Commonly known as:  ZETIA TAKE 1 TABLET BY MOUTH EVERY DAY   furosemide 80 MG tablet Commonly known as:  LASIX Take 1 tablet (80 mg total) by mouth daily.   isosorbide mononitrate 30 MG 24 hr tablet Commonly known as:  IMDUR TAKE 2 TABLETS TWICE A DAY   KLOR-CON M20 20 MEQ tablet Generic drug:  potassium chloride SA Take 40 mEq by mouth daily.   levothyroxine 112 MCG tablet Commonly known as:  SYNTHROID, LEVOTHROID Take 112 mcg by mouth at bedtime.   losartan-hydrochlorothiazide 100-12.5 MG tablet Commonly known as:  HYZAAR Take 1 tablet by mouth daily.   multivitamin with minerals Tabs tablet Take 1 tablet by mouth daily. Centrum 50+   nitroGLYCERIN 0.4 MG SL tablet Commonly known as:  NITROSTAT Place 1 tablet (0.4 mg total) under the tongue every 5 (five) minutes as needed for  chest pain.   oxyCODONE 5 MG immediate release tablet Commonly known as:  Oxy IR/ROXICODONE Take 1 tablet (5 mg total) by mouth every 3 (three) hours as needed for moderate pain ((score 4 to 6)).   pravastatin 80 MG tablet Commonly known as:  PRAVACHOL Take 80 mg by mouth at bedtime.   vitamin C 500 MG tablet Commonly known as:  ASCORBIC ACID Take 500 mg by mouth daily.        Signed: Dellis Filbert  D Dhruvan Gullion 06/03/2018, 7:44 AM

## 2018-06-03 NOTE — Progress Notes (Signed)
Physical Therapy Evaluation Patient Details Name: Jill Preston MRN: [id number] DOB: Jan 06, 1933 Today's Date: 06/03/2018   History of Present Illness  Pt is a 84 y.o. female s/p PLIF @ L4-5. PMH significant for CHF, CKD stage 3, CVI, CAN, HTN, and HLD.  Clinical Impression  Patient is s/p above surgery resulting in deficits listed below (see PT Problem List). At time of evaluation pt performed ambulation and negotiated stairs with gross min G with SPC for safety. Pt educated on precautions, cane sequencing, stairs, positioning, and generalized walking program. Recommending pt utilized SPC at d/c to improve safety with post-op mobilization. Will continue to follow acutely while admitted to increase their independence and safety with mobility to allow discharge to the venue listed below.       Follow Up Recommendations No PT follow up;Supervision/Assistance - 24 hour    Equipment Recommendations  None recommended by PT    Recommendations for Other Services       Precautions / Restrictions Precautions Precautions: Back;Fall Precaution Booklet Issued: Yes (comment) Precaution Comments: Reviewed precautions in full with pt.  Required Braces or Orthoses: Spinal Brace Spinal Brace: Applied in sitting position;Lumbar corset Restrictions Weight Bearing Restrictions: No      Mobility  Bed Mobility Overal bed mobility: Needs Assistance Bed Mobility: Rolling;Sidelying to Sit Rolling: Min guard Sidelying to sit: Min guard;HOB elevated       General bed mobility comments: Pt reports having an adjustable bed at home. Bed rails lowered. Min cues required for log roll sequence. Increased time noted  Transfers Overall transfer level: Needs assistance Equipment used: None Transfers: Sit to/from Stand Sit to Stand: Min guard         General transfer comment: Min G for safety. Pt unable to perform sit>stand without bed height elevated to that similiar to bed height at home.  Demonstrated safe hand placement.   Ambulation/Gait Ambulation/Gait assistance: Min guard Gait Distance (Feet): 300 Feet Assistive device: Straight cane Gait Pattern/deviations: Step-through pattern;Decreased stance time - right;Decreased weight shift to right Gait velocity: decreased Gait velocity interpretation: <1.31 ft/sec, indicative of household ambulator General Gait Details: Min G for safety as pt reports R knee occasionally buckles. VCs for sequenceing with SPC, with pt reporting she feels most comortable with SPC on right side as RLE is weaker despite education for improved support on opposite side. Pt's right knee buckled once during gait but believe pt  was demonstrating R knee occasionally giving away without cane on R side.  Stairs Stairs: Yes Stairs assistance: Min guard Stair Management: One rail Right;One rail Left;Step to pattern;Forwards;With cane Number of Stairs: 4 General stair comments: Pt ascended forwards with stronger LE leading and descended forwards with weaker LE leading. Min cues for sequencing with SPC and overall safety negotiating stairs.   Wheelchair Mobility    Modified Rankin (Stroke Patients Only)       Balance Overall balance assessment: Mild deficits observed, not formally tested                                           Pertinent Vitals/Pain Pain Assessment: No/denies pain(Reports pressure but no pain)    Home Living Family/patient expects to be discharged to:: Private residence Living Arrangements: Spouse/significant other Available Help at Discharge: Family;Available 24 hours/day Type of Home: House Home Access: Stairs to enter Entrance Stairs-Rails: Can reach both Entrance Stairs-Number of Steps: 3(front entrance )  Home Layout: One level Home Equipment: Walker - 2 wheels;Walker - 4 wheels;Cane - single point;Grab bars - tub/shower Additional Comments: Pt has son available down the street if needed. Master bathroom  has tub/shower unit but pt only uses walk-in shower in hallway.Pt reports she has 3 entrances to her home all with steps.    Prior Function Level of Independence: Independent         Comments: Uses SPC when ascended/descending back entrance of house as it has 7 stairs and she can only reach one rail     Hand Dominance        Extremity/Trunk Assessment   Upper Extremity Assessment Upper Extremity Assessment: Overall WFL for tasks assessed    Lower Extremity Assessment Lower Extremity Assessment: Generalized weakness(Reports RLE weaker than LLE)    Cervical / Trunk Assessment Cervical / Trunk Assessment: Normal  Communication   Communication: No difficulties  Cognition Arousal/Alertness: Awake/alert Behavior During Therapy: WFL for tasks assessed/performed Overall Cognitive Status: Within Functional Limits for tasks assessed                                        General Comments General comments (skin integrity, edema, etc.): Husband present at end of session. Pt reports husband often needs to use SPC due to falls. Recommended ordering pt another cane so her and spouse both have cane but pt declined and said spouse wouldn't actually use the cane.    Exercises     Assessment/Plan    PT Assessment Patient needs continued PT services  PT Problem List Decreased strength;Decreased range of motion;Decreased activity tolerance;Decreased balance;Decreased mobility;Decreased coordination;Decreased knowledge of use of DME;Decreased safety awareness;Decreased knowledge of precautions       PT Treatment Interventions Gait training;DME instruction;Stair training;Functional mobility training;Therapeutic activities;Therapeutic exercise;Balance training;Patient/family education;Modalities    PT Goals (Current goals can be found in the Care Plan section)  Acute Rehab PT Goals Patient Stated Goal: to go home PT Goal Formulation: With patient Time For Goal  Achievement: 06/10/18 Potential to Achieve Goals: Good    Frequency Min 5X/week   Barriers to discharge        Co-evaluation               AM-PAC PT "6 Clicks" Daily Activity  Outcome Measure Difficulty turning over in bed (including adjusting bedclothes, sheets and blankets)?: A Little Difficulty moving from lying on back to sitting on the side of the bed? : A Little Difficulty sitting down on and standing up from a chair with arms (e.g., wheelchair, bedside commode, etc,.)?: A Little Help needed moving to and from a bed to chair (including a wheelchair)?: A Little Help needed walking in hospital room?: A Little Help needed climbing 3-5 steps with a railing? : A Little 6 Click Score: 18    End of Session Equipment Utilized During Treatment: Gait belt;Back brace Activity Tolerance: Patient tolerated treatment well Patient left: in chair;with call bell/phone within reach;with family/visitor present Nurse Communication: Mobility status PT Visit Diagnosis: Other abnormalities of gait and mobility (R26.89);Muscle weakness (generalized) (M62.81);Difficulty in walking, not elsewhere classified (R26.2)    Time: 4401-0272 PT Time Calculation (min) (ACUTE ONLY): 24 min   Charges:    1 Mod Eval Gait Training: 8-22 mins          Einar Crow, [place]  Student Physical Therapist Acute Rehab [phone]   Einar Crow 06/03/2018, 8:43 AM

## 2018-06-03 NOTE — Progress Notes (Signed)
Patient is discharged from room 3C09 at this time. Alert and in stable condition. IV site d/c'd and instructions read to patient and spouse with understanding verbalized. Left unit via wheelchair with all belongings at side. 

## 2018-06-04 ENCOUNTER — Inpatient Hospital Stay (HOSPITAL_COMMUNITY): Admission: RE | Admit: 2018-06-04 | Payer: PPO | Source: Ambulatory Visit

## 2018-06-04 MED FILL — Sodium Chloride IV Soln 0.9%: INTRAVENOUS | Qty: 1000 | Status: AC

## 2018-06-04 MED FILL — Heparin Sodium (Porcine) Inj 1000 Unit/ML: INTRAMUSCULAR | Qty: 30 | Status: AC

## 2018-06-09 ENCOUNTER — Inpatient Hospital Stay: Admit: 2018-06-09 | Payer: PPO | Admitting: Neurosurgery

## 2018-06-09 SURGERY — POSTERIOR LUMBAR FUSION 1 LEVEL
Anesthesia: General

## 2018-06-16 ENCOUNTER — Other Ambulatory Visit: Payer: Self-pay | Admitting: Cardiology

## 2018-06-30 ENCOUNTER — Ambulatory Visit: Payer: PPO | Admitting: Cardiovascular Disease

## 2018-07-01 DIAGNOSIS — M5416 Radiculopathy, lumbar region: Secondary | ICD-10-CM | POA: Diagnosis not present

## 2018-07-28 DIAGNOSIS — I10 Essential (primary) hypertension: Secondary | ICD-10-CM | POA: Diagnosis not present

## 2018-07-28 DIAGNOSIS — N952 Postmenopausal atrophic vaginitis: Secondary | ICD-10-CM | POA: Diagnosis not present

## 2018-07-28 DIAGNOSIS — E039 Hypothyroidism, unspecified: Secondary | ICD-10-CM | POA: Diagnosis not present

## 2018-07-30 DIAGNOSIS — L900 Lichen sclerosus et atrophicus: Secondary | ICD-10-CM | POA: Diagnosis not present

## 2018-08-01 ENCOUNTER — Ambulatory Visit (INDEPENDENT_AMBULATORY_CARE_PROVIDER_SITE_OTHER): Payer: PPO | Admitting: Cardiovascular Disease

## 2018-08-01 ENCOUNTER — Encounter: Payer: Self-pay | Admitting: Cardiovascular Disease

## 2018-08-01 VITALS — BP 132/64 | HR 65 | Ht 65.5 in | Wt 207.4 lb

## 2018-08-01 DIAGNOSIS — I10 Essential (primary) hypertension: Secondary | ICD-10-CM | POA: Diagnosis not present

## 2018-08-01 DIAGNOSIS — I25708 Atherosclerosis of coronary artery bypass graft(s), unspecified, with other forms of angina pectoris: Secondary | ICD-10-CM

## 2018-08-01 DIAGNOSIS — I5032 Chronic diastolic (congestive) heart failure: Secondary | ICD-10-CM

## 2018-08-01 DIAGNOSIS — I739 Peripheral vascular disease, unspecified: Secondary | ICD-10-CM

## 2018-08-01 DIAGNOSIS — N183 Chronic kidney disease, stage 3 unspecified: Secondary | ICD-10-CM

## 2018-08-01 DIAGNOSIS — E785 Hyperlipidemia, unspecified: Secondary | ICD-10-CM

## 2018-08-01 NOTE — Progress Notes (Signed)
Patient ID: Jill Preston, female   DOB: Feb 02, 1933, 84 y.o.   MRN: [id number] Patient ID: Jill Preston, female   DOB: [birth year], 84 y.o.   MRN: [id number]    Cardiology Office Note    Date:  08/01/2018   ID:  Jill Preston, DOB 1933-04-09, MRN [id number]  PCP:  Merrilee Seashore, MD  Cardiologist:   Sanda Klein, MD   chief complaint: Fatigue   History of Present Illness:  Jill Preston is a 84 y.o. female returning in follow-up For CAD and CHF.  She feels much better following her back surgery.  She is walking with a cane.  She feels her activity level has improved substantially.  She started amlodipine after her visit with Rosaria Ferries, PA and states that this has really helped with her breathing.  She has chronic right ankle swelling, but this has not worsened.  She is 3 pounds lighter than when I last saw her at 207 pounds.  Attempts at diuresis down to 200 pounds were not well-tolerated.  She can tell when her potassium is low since she develops muscle cramps in her fingers and has not been a recent problem.  She denies orthopnea, PND, angina at rest or with activity, palpitations, dizziness, syncope or focal neurological events.  Completed a drug Aricept in the office..  She has a long history of coronary disease. She underwent proximal LAD bare-metal stenting and balloon angioplasty of the mid LAD in 2013. She has had persistent ischemia in the territory of the diagonal artery on nuclear stress test performed over the last few years. Coronary angiography performed May 2016 showed a widely patent LAD stent, 60% ostial circumflex stenosis, 20-30% right coronary artery stenosis. There was concern about possible left coronary artery stenosis but fractional flow reserve was normal (0.96 at baseline, 0.91 during intravenous adenosine infusion). She had good anginal response to Ranexa but developed severe constipation and could not tolerate the medication. She has been  intolerant to beta blockers due to bradycardia. She has preserved left ventricular systolic function but has had episodes of acute exacerbation of diastolic heart failure attributed to hypertensive heart disease. December 2015, she was critically ill with an acute left iliofemoral DVT with anticoagulation complicated by retroperitoneal hematoma and hemorrhagic shock, requiring placement of an inferior vena cava filter. The filter was removed in June 2016. Coronary angiography in July 2018 showed unchanged anatomy of the eccentric stenosis in the left main coronary artery with noncritical FFR (0.89 in the LAD, 0.86 in the LCx).  Intravascular Pressure Wire/FFR Study  Left Heart Cath and Coronary Angiography  Conclusion     LM lesion, 70 %stenosed.  Mid LAD lesion, 0 %stenosed at site of prior stent.  Ost Cx lesion, 60 %stenosed.  Prox RCA-1 lesion, 20 %stenosed.  Prox RCA-2 lesion, 30 %stenosed.  The left ventricular systolic function is normal.  LV end diastolic pressure is normal.  The left ventricular ejection fraction is 55-65% by visual estimate.   1. 70% eccentric distal left main stenosis. Angiographically similar to 2016. FFR 0.89 into the LAD and 0.86 into the LCx suggesting that this lesion is not flow limiting.  2. Otherwise nonobstructive CAD. Patent stent in LAD. 3. Normal LV function 4. Normal LVEDP  Plan: recommend continued medical therapy.     Past Medical History:  Diagnosis Date  . Arthritis    right knee  . Cancer [facility]) yrs ago   skin cancer removed from face  . Chronic diastolic CHF (  congestive heart failure) ([place])   . Chronic venous insufficiency    LEA VENOUS, 10/17/2011 - mild reflux in bilateral common femoral veins  . CKD (chronic kidney disease), stage III ([place])   . Coronary artery disease    a. s/p PCI/BMS to prox LAD and balloon angioplasty to mLAD with suboptimal result in 2000. b. Abnl nuc 2012, cath 09/2011 - showed that the overall territory  of potential ischemia was small and attributable to a moderately diseased small second diagonal artery. Med rx.  Marland Kitchen Dyspnea    with activity  . Headache   . History of blood transfusion 2017  . Hyperlipidemia   . Hypertension   . Hypertensive heart disease   . Hypothyroidism   . Obesity   . Pneumonia    several times  . Stroke [place])    pt. states she had "light stroke" in sept. 1980  . Urinary incontinence     Past Surgical History:  Procedure Laterality Date  . ABDOMINAL HYSTERECTOMY  1970's   complete  . ANKLE ARTHROSCOPY Right 07/10/2017   Procedure: ANKLE ARTHROSCOPY;  Surgeon: [name], DPM;  Location: [place];  Service: Podiatry;  Laterality: Right;  . BACK SURGERY  07/26/2016   cervical neck surgery, [facility]  . BLADDER SURGERY  2010   WITH MESH  bladder tach  . bunion removal surgery Bilateral 15 yrs ago  . CARDIAC CATHETERIZATION Left 09/25/2011   Medical management  . CARDIAC CATHETERIZATION Left 03/25/2001   Normal LV function, LAD residual narrowing of less than 10%, normal ramus intermediate, circumflex, and RCA,   . CARDIAC CATHETERIZATION  09/04/1999   LAD, 3x32mm Tetra stent resulting in a reduction of the 80% stenosis to 0% residual  . CARDIAC CATHETERIZATION N/A 01/26/2015   Procedure: Right/Left Heart Cath and Coronary Angiography;  Surgeon: Troy Sine, MD;  Location: [place] CV LAB;  Service: Cardiovascular;  Laterality: N/A;  . CARDIAC CATHETERIZATION N/A 01/27/2015   Procedure: Intravascular Pressure Wire/FFR Study;  Surgeon: Burnell Blanks, MD;  Location: [place] CV LAB;  Service: Cardiovascular;  Laterality: N/A;  . CARDIAC CATHETERIZATION N/A 01/27/2015   Procedure: Right Heart Cath;  Surgeon: Burnell Blanks, MD;  Location: [place] CV LAB;  Service: Cardiovascular;  Laterality: N/A;  . CHOLECYSTECTOMY    . COLONOSCOPY WITH PROPOFOL N/A 03/07/2017   Procedure: COLONOSCOPY WITH PROPOFOL;  Surgeon: Juanita Craver, MD;  Location: [facility];  Service: Endoscopy;  Laterality: N/A;  . CORONARY STENT PLACEMENT     LAD x 1  . ganglion cyst removal  yrs ago   x 2  . ILIAC VEIN ANGIOPLASTY / STENTING  02/15/2015  . INTRAVASCULAR PRESSURE WIRE/FFR STUDY N/A 04/05/2017   Procedure: Intravascular Pressure Wire/FFR Study;  Surgeon: Martinique, Peter M, MD;  Location: [place] CV LAB;  Service: Cardiovascular;  Laterality: N/A;  . IVC FILTER INSERTION  2016  . IVC FILTER REMOVAL  02/15/2015   at [facility]  . LEFT HEART CATH AND CORONARY ANGIOGRAPHY N/A 04/05/2017   Procedure: Left Heart Cath and Coronary Angiography;  Surgeon: Martinique, Peter M, MD;  Location: [place] CV LAB;  Service: Cardiovascular;  Laterality: N/A;  . LEFT HEART CATHETERIZATION WITH CORONARY ANGIOGRAM N/A 09/25/2011   Procedure: LEFT HEART CATHETERIZATION WITH CORONARY ANGIOGRAM;  Surgeon: Sanda Klein, MD;  Location: [place] CATH LAB;  Service: Cardiovascular;  Laterality: N/A;  . multiple bladder surgeries to remove mesh    . ROTATOR CUFF REPAIR Right   .  stent to groin Left 08/2014   left leg  . TENDON REPAIR Right 07/10/2017   Procedure: RIGHT PERONEAL TENDON REPAIR;  Surgeon: [name], DPM;  Location: [place];  Service: Podiatry;  Laterality: Right;    Outpatient Medications Prior to Visit  Medication Sig Dispense Refill  . amLODipine (NORVASC) 2.5 MG tablet Take 1 tablet (2.5 mg total) by mouth daily. 180 tablet 3  . aspirin EC 81 MG tablet Take 1 tablet (81 mg total) by mouth daily. 90 tablet 3  . bisacodyl (DULCOLAX) 5 MG EC tablet Take 5 mg by mouth 2 (two) times daily.    . Coenzyme Q10 (COQ10 PO) Take 2 capsules by mouth daily.    Marland Kitchen ezetimibe (ZETIA) 10 MG tablet TAKE 1 TABLET BY MOUTH EVERY DAY (Patient taking differently: Take 10 mg by mouth daily. ) 90 tablet 1  . furosemide (LASIX) 80 MG tablet Take 1 tablet (80 mg total) by mouth daily. 90 tablet 3  . isosorbide mononitrate (IMDUR) 30 MG 24 hr tablet TAKE 2 TABLETS  TWICE A DAY 360 tablet 2  . KLOR-CON M20 20 MEQ tablet Take 40 mEq by mouth daily.  3  . levothyroxine (SYNTHROID, LEVOTHROID) 112 MCG tablet Take 112 mcg by mouth at bedtime.   4  . losartan-hydrochlorothiazide (HYZAAR) 100-12.5 MG tablet Take 1 tablet by mouth daily. 90 tablet 1  . Multiple Vitamin (MULTIVITAMIN WITH MINERALS) TABS tablet Take 1 tablet by mouth daily. Centrum 50+     . nitroGLYCERIN (NITROSTAT) 0.4 MG SL tablet Place 1 tablet (0.4 mg total) under the tongue every 5 (five) minutes as needed for chest pain. 25 tablet 2  . oxyCODONE (OXY IR/ROXICODONE) 5 MG immediate release tablet Take 1 tablet (5 mg total) by mouth every 3 (three) hours as needed for moderate pain ((score 4 to 6)). 30 tablet 0  . pravastatin (PRAVACHOL) 80 MG tablet Take 80 mg by mouth at bedtime.    . vitamin C (ASCORBIC ACID) 500 MG tablet Take 500 mg by mouth daily.     Marland Kitchen acetaminophen (TYLENOL) 500 MG tablet Take 500-1,000 mg by mouth every 6 (six) hours as needed (for pain.).     Marland Kitchen cyclobenzaprine (FLEXERIL) 5 MG tablet Take 1 tablet (5 mg total) by mouth 3 (three) times daily as needed for muscle spasms. 50 tablet 1   No facility-administered medications prior to visit.      Allergies:   Atorvastatin; Penicillins; Shellfish allergy; Aminophylline; Iodinated diagnostic agents; Latex; Oxybutynin chloride; Vancomycin; Adhesive [tape]; Betadine [povidone iodine]; Ciprofloxacin; Codeine; Gelnique [oxybutynin]; and Ranolazine   Social History   Socioeconomic History  . Marital status: Married    Spouse name: Not on file  . Number of children: Not on file  . Years of education: Not on file  . Highest education level: Not on file  Occupational History  . Not on file  Social Needs  . Financial resource strain: Not on file  . Food insecurity:    Worry: Not on file    Inability: Not on file  . Transportation needs:    Medical: Not on file    Non-medical: Not on file  Tobacco Use  . Smoking status:  Never Smoker  . Smokeless tobacco: Never Used  Substance and Sexual Activity  . Alcohol use: No  . Drug use: No  . Sexual activity: Not on file  Lifestyle  . Physical activity:    Days per week: Not on file    Minutes  per session: Not on file  . Stress: Not on file  Relationships  . Social connections:    Talks on phone: Not on file    Gets together: Not on file    Attends religious service: Not on file    Active member of club or organization: Not on file    Attends meetings of clubs or organizations: Not on file    Relationship status: Not on file  Other Topics Concern  . Not on file  Social History Narrative  . Not on file     Family History:  The patient's family history includes Cancer in her mother; Heart disease in her brother, father, and sister.   ROS:   Please see the history of present illness.    ROS All other systems are reviewed and are negative  PHYSICAL EXAM:   VS:  BP 132/64   Pulse 65   Ht 5' 5.5" (1.664 m)   Wt 207 lb 6.4 oz (94.1 kg)   BMI 33.99 kg/m    Come pick me up in the epitrochlear, pick me up in the ventricle General: Alert, oriented x3, no distress, moderately obese Head: no evidence of trauma, PERRL, EOMI, no exophtalmos or lid lag, no myxedema, no xanthelasma; normal ears, nose and oropharynx Neck: normal jugular venous pulsations and no hepatojugular reflux; brisk carotid pulses without delay and no carotid bruits Chest: clear to auscultation, no signs of consolidation by percussion or palpation, normal fremitus, symmetrical and full respiratory excursions Cardiovascular: normal position and quality of the apical impulse, regular rhythm, normal first and second heart sounds, no murmurs, rubs or gallops Abdomen: no tenderness or distention, no masses by palpation, no abnormal pulsatility or arterial bruits, normal bowel sounds, no hepatosplenomegaly Extremities: no clubbing, cyanosis or edema; 2+ radial, ulnar and brachial pulses  bilaterally; 2+ right femoral, posterior tibial and dorsalis pedis pulses; 2+ left femoral, posterior tibial and dorsalis pedis pulses; no subclavian or femoral bruits Neurological: grossly nonfocal Psych: Normal mood and affect   Wt Readings from Last 3 Encounters:  08/01/18 207 lb 6.4 oz (94.1 kg)  06/02/18 205 lb (93 kg)  05/14/18 209 lb 3.2 oz (94.9 kg)      Studies/Labs Reviewed:   EKG:  EKG is not ordered today.   Recent Labs: 11/18/2017: NT-Pro BNP 101 12/12/2017: BNP 59.8; TSH 0.266 03/26/2018: ALT 27 06/03/2018: BUN 23; Creatinine, Ser 1.26; Hemoglobin 13.1; Platelets 165; Potassium 3.8; Sodium 137   Lipid Panel    Component Value Date/Time   CHOL 193 11/18/2017 0857   TRIG 212 (H) 11/18/2017 0857   HDL 47 11/18/2017 0857   CHOLHDL 4.1 11/18/2017 0857   CHOLHDL 4.2 06/27/2015 1336   VLDL 48 (H) 06/27/2015 1336   LDLCALC 104 (H) 11/18/2017 0857   Labs 09/04/2016 total cholesterol 169, triglycerides 160, HDL 40, LDL 97, hemoglobin A1c 6.1%, glucose 104, creatinine 1.16, BUN 19, TSH 0.40  CATH 04/05/2017   LM lesion, 70 %stenosed.  Mid LAD lesion, 0 %stenosed at site of prior stent.  Ost Cx lesion, 60 %stenosed.  Prox RCA-1 lesion, 20 %stenosed.  Prox RCA-2 lesion, 30 %stenosed.  The left ventricular systolic function is normal.  LV end diastolic pressure is normal.  The left ventricular ejection fraction is 55-65% by visual estimate.   1. 70% eccentric distal left main stenosis. Angiographically similar to 2016. FFR 0.89 into the LAD and 0.86 into the LCx suggesting that this lesion is not flow limiting.  2. Otherwise nonobstructive CAD. Patent  stent in LAD. 3. Normal LV function 4. Normal LVEDP  Plan: recommend continued medical therapy.   ASSESSMENT:    1. Dyslipidemia   2. Chronic diastolic heart failure ([place])   3. Coronary artery disease involving coronary bypass graft of native heart with other forms of angina pectoris ([place])   4. Essential  hypertension   5. PAD (peripheral artery disease) ([place])   6. CKD (chronic kidney disease) stage 3, GFR 30-59 ml/min ([facility])      PLAN:  In order of problems listed above:  1. CHF: She is doing well and appears to be clinically euvolemic.  She always has some swelling in her right ankle due to previous injury.  Attempts to diurese her to lower weights have always led to symptoms of dehydration.  Her dyspnea is better after starting amlodipine, making me wonder whether it was some type of angina equivalent and that she might have coronary vasospasm.  She has a narrow margin of compensation between heart failure and kidney failure.  A large part of her dyspnea is attributable to obesity and deconditioning.  Asked her to call if her weight increases over 215 pounds. 2. CAD: No angina at rest or with activity.  She is known to have a moderate stenosis in the left main coronary artery that was not significant by pressure wire analysis at cardiac catheterization performed a in July 2018.  I wonder if she could have some component of vasospasm causing periods of shortness of breath, since she improved so well on a small dose of amlodipine. 3. HTN: Blood pressure should always be checked in her right arm since she appears to have left subclavian artery stenosis.  Good blood pressure control today 4. PAD: Asymptomatic.  Does not have symptoms of left subclavian steal syndrome or left arm claudication 5. Hyperlipidemia: On maximum dose of pravastatin, intolerant of other more potent agents.  Consider Repatha or Praluent if still not at target LDL under 70 at her next lipid profile check.  Mildly elevated triglycerides do not justify fibrate therapy, especially in a patient with renal insufficiency. 6. CKD: Baseline creatinine seems to be around 1.2.  Stable.   Medication Adjustments/Labs and Tests Ordered: Current medicines are reviewed at length with the patient today.  Concerns regarding medicines are  outlined above.  Medication changes, Labs and Tests ordered today are listed in the Patient Instructions below. Patient Instructions  Medication Instructions:  Your physician recommends that you continue on your current medications as directed. Please refer to the Current Medication list given to you today.  If you need a refill on your cardiac medications before your next appointment, please call your pharmacy.   Lab work: Your physician recommends that you return for a FASTING lipid profile and hepatic function panel prior to next appointment with Dr. Sallyanne Kuster.  If you have labs (blood work) drawn today and your tests are completely normal, you will receive your results only by: Marland Kitchen MyChart Message (if you have MyChart) OR . A paper copy in the mail If you have any lab test that is abnormal or we need to change your treatment, we will call you to review the results.  Follow-Up: At [facility], you and your health needs are our priority.  As part of our continuing mission to provide you with exceptional heart care, we have created designated Provider Care Teams.  These Care Teams include your primary Cardiologist (physician) and Advanced Practice Providers (APPs -  Physician Assistants and Nurse Practitioners) who  all work together to provide you with the care you need, when you need it. You will need a follow up appointment in 6 months.  Please call our office 2 months in advance to schedule this appointment.  You may see or one of the following Advanced Practice Providers on your designated Care Team: Almyra Deforest, [place] . Fabian Sharp, PA-C  Any Other Special Instructions Will Be Listed Below (If Applicable). none        Signed, Sanda Klein, MD  08/01/2018 1:41 PM    [facility] [address], [place], [place]  [address] Phone: [phone]; Fax: [phone]

## 2018-08-01 NOTE — Patient Instructions (Signed)
Medication Instructions:  Your physician recommends that you continue on your current medications as directed. Please refer to the Current Medication list given to you today.  If you need a refill on your cardiac medications before your next appointment, please call your pharmacy.   Lab work: Your physician recommends that you return for a FASTING lipid profile and hepatic function panel prior to next appointment with Dr. Sallyanne Kuster.  If you have labs (blood work) drawn today and your tests are completely normal, you will receive your results only by: Marland Kitchen MyChart Message (if you have MyChart) OR . A paper copy in the mail If you have any lab test that is abnormal or we need to change your treatment, we will call you to review the results.  Follow-Up: At [facility], you and your health needs are our priority.  As part of our continuing mission to provide you with exceptional heart care, we have created designated Provider Care Teams.  These Care Teams include your primary Cardiologist (physician) and Advanced Practice Providers (APPs -  Physician Assistants and Nurse Practitioners) who all work together to provide you with the care you need, when you need it. You will need a follow up appointment in 6 months.  Please call our office 2 months in advance to schedule this appointment.  You may see or one of the following Advanced Practice Providers on your designated Care Team: Almyra Deforest, [place] . Fabian Sharp, PA-C  Any Other Special Instructions Will Be Listed Below (If Applicable). none

## 2018-08-19 DIAGNOSIS — L900 Lichen sclerosus et atrophicus: Secondary | ICD-10-CM | POA: Diagnosis not present

## 2018-09-11 ENCOUNTER — Other Ambulatory Visit: Payer: Self-pay | Admitting: Cardiovascular Disease

## 2018-09-15 ENCOUNTER — Telehealth: Payer: Self-pay | Admitting: Cardiovascular Disease

## 2018-09-15 NOTE — Telephone Encounter (Signed)
New Message   Pt c/o BP issue:  1. What are your last 5 BP readings? 132/69 (8:00am this morning) HR 107, 94/59 (about 20 minutes ago) HR 104 2. Are you having any other symptoms (ex. Dizziness, headache, blurred vision, passed out)? Fatigue, low energy  3. What is your medication issue? none

## 2018-09-15 NOTE — Telephone Encounter (Signed)
Returned call to patient of Dr. Loletha Grayer who reports BP and HR issues. She reports she checked her BP at 8am 132/69 and HR 107. She washed dishes today, felt lightheaded and checked BP/HR - 94/59 HR 104, 96/66 HR 102 (about noon today). She reports no energy since last Friday. She states on Friday or Saturday night she had left breast and left arm pain that lasted a few hours and eased off. She was unable to describe this pain. She is checking BP/HR in right arm, as suggested per MD note. She does not think her HR has been this high (100s), but she does not have a log of the past few days BP/HR readings to provide for comparison, although she checks on daily basis.   Advised would notify MD and call her back with suggestions

## 2018-09-15 NOTE — Telephone Encounter (Signed)
Most likely explanation is sinus tachycardia and low BP due to hypovolemia. Please stop the furosemide for a couple of days. Please ask her to report her weight. Drink plenty of water today. Call back tomorrow AM with HR and BP, please. MCr

## 2018-09-15 NOTE — Telephone Encounter (Signed)
Patient called with MD recommendations. She agreed w/plan and voiced understanding. She reports she weighed 207.8lbs today. She was advised to call back tomorrow.

## 2018-09-16 NOTE — Telephone Encounter (Signed)
Patient called with MD recommendations/parameters for lasix. She voiced understanding of med advice and daily weights.

## 2018-09-16 NOTE — Telephone Encounter (Signed)
Patient called in with VS reading per MD request.   6:18am prior to meds - 146/78 HR 78 8:00am took meds 8:30am after meds (except lasix) - 131/64 HR 75 Weight today 209lbs No complains Reports HR is better controlled than yesterday.

## 2018-09-16 NOTE — Addendum Note (Signed)
Addended by: Fidel Levy on: 09/16/2018 10:31 AM   Modules accepted: Orders

## 2018-09-16 NOTE — Telephone Encounter (Signed)
Suspect optimal "fluid weight" is 209-211 lb. Continue daily weights. Recommend holding furosemide if weight is under 208, taking usual dose if 209-214 and calling us for advice about increasing diuretic if weight is over 215. MCr

## 2018-09-23 DIAGNOSIS — N390 Urinary tract infection, site not specified: Secondary | ICD-10-CM | POA: Diagnosis not present

## 2018-09-23 DIAGNOSIS — R5383 Other fatigue: Secondary | ICD-10-CM | POA: Diagnosis not present

## 2018-09-26 DIAGNOSIS — R531 Weakness: Secondary | ICD-10-CM | POA: Diagnosis not present

## 2018-09-26 DIAGNOSIS — M1990 Unspecified osteoarthritis, unspecified site: Secondary | ICD-10-CM | POA: Diagnosis not present

## 2018-10-02 ENCOUNTER — Encounter: Payer: Self-pay | Admitting: Podiatry

## 2018-10-02 ENCOUNTER — Other Ambulatory Visit: Payer: Self-pay

## 2018-10-02 ENCOUNTER — Ambulatory Visit (INDEPENDENT_AMBULATORY_CARE_PROVIDER_SITE_OTHER): Payer: HMO

## 2018-10-02 ENCOUNTER — Ambulatory Visit: Payer: PPO | Admitting: Podiatry

## 2018-10-02 DIAGNOSIS — R269 Unspecified abnormalities of gait and mobility: Secondary | ICD-10-CM | POA: Diagnosis not present

## 2018-10-02 DIAGNOSIS — R600 Localized edema: Secondary | ICD-10-CM | POA: Diagnosis not present

## 2018-10-02 DIAGNOSIS — S86311S Strain of muscle(s) and tendon(s) of peroneal muscle group at lower leg level, right leg, sequela: Secondary | ICD-10-CM

## 2018-10-02 DIAGNOSIS — S99911A Unspecified injury of right ankle, initial encounter: Secondary | ICD-10-CM

## 2018-10-02 NOTE — Progress Notes (Signed)
Subjective:  Patient ID: Jill Preston, female    DOB: Nov 22, 1932,  MRN: [id number]  Chief Complaint  Patient presents with  . Ankle Pain    R-lateral; "swelling and painful after vacumming yesterday"    85 y.o. female presents with the above complaint. History of Right ankle peroneal repair last year - has been doing well for over a year without pain until yesterday where she was vacuuming and felt pain and swelling acute onset right ankle. Hard to walk due to the pain.  Review of Systems: Negative except as noted in the HPI. Denies N/V/F/Ch.  Past Medical History:  Diagnosis Date  . Arthritis    right knee  . Cancer [facility]) yrs ago   skin cancer removed from face  . Chronic diastolic CHF (congestive heart failure) ([place])   . Chronic venous insufficiency    LEA VENOUS, 10/17/2011 - mild reflux in bilateral common femoral veins  . CKD (chronic kidney disease), stage III ([place])   . Coronary artery disease    a. s/p PCI/BMS to prox LAD and balloon angioplasty to mLAD with suboptimal result in 2000. b. Abnl nuc 2012, cath 09/2011 - showed that the overall territory of potential ischemia was small and attributable to a moderately diseased small second diagonal artery. Med rx.  Marland Kitchen Dyspnea    with activity  . Headache   . History of blood transfusion 2017  . Hyperlipidemia   . Hypertension   . Hypertensive heart disease   . Hypothyroidism   . Obesity   . Pneumonia    several times  . Stroke [facility])    pt. states she had "light stroke" in sept. 1980  . Urinary incontinence     Current Outpatient Medications:  .  aspirin EC 81 MG tablet, Take 1 tablet (81 mg total) by mouth daily., Disp: 90 tablet, Rfl: 3 .  b complex vitamins tablet, Take by mouth., Disp: , Rfl:  .  bisacodyl (DULCOLAX) 5 MG EC tablet, Take 5 mg by mouth 2 (two) times daily., Disp: , Rfl:  .  Coenzyme Q10 (COQ10 PO), Take 2 capsules by mouth daily., Disp: , Rfl:  .  ezetimibe (ZETIA) 10 MG tablet, TAKE 1 TABLET BY  MOUTH EVERY DAY (Patient taking differently: Take 10 mg by mouth daily. ), Disp: 90 tablet, Rfl: 1 .  furosemide (LASIX) 80 MG tablet, Take 80 mg by mouth. Take daily if weight is 209-214lbs. HOLD if weight is under 208lbs. Call office if weight greater than 215lbs., Disp: , Rfl:  .  isosorbide mononitrate (IMDUR) 30 MG 24 hr tablet, TAKE 2 TABLETS TWICE A DAY, Disp: 360 tablet, Rfl: 2 .  KLOR-CON M20 20 MEQ tablet, Take 40 mEq by mouth daily., Disp: , Rfl: 3 .  levothyroxine (SYNTHROID, LEVOTHROID) 112 MCG tablet, Take 112 mcg by mouth at bedtime. , Disp: , Rfl: 4 .  losartan-hydrochlorothiazide (HYZAAR) 100-12.5 MG tablet, Take 1 tablet by mouth daily., Disp: 90 tablet, Rfl: 1 .  losartan-hydrochlorothiazide (HYZAAR) 100-25 MG tablet, TAKE 1/2 TABLET EVERY DAY, Disp: 45 tablet, Rfl: 1 .  Multiple Vitamin (MULTIVITAMIN WITH MINERALS) TABS tablet, Take 1 tablet by mouth daily. Centrum 50+ , Disp: , Rfl:  .  Multiple Vitamins-Minerals (MULTIVITAMIN WITH MINERALS) tablet, Take by mouth., Disp: , Rfl:  .  nitroGLYCERIN (NITROSTAT) 0.4 MG SL tablet, Place 1 tablet (0.4 mg total) under the tongue every 5 (five) minutes as needed for chest pain., Disp: 25 tablet, Rfl: 2 .  oxyCODONE (OXY  IR/ROXICODONE) 5 MG immediate release tablet, Take 1 tablet (5 mg total) by mouth every 3 (three) hours as needed for moderate pain ((score 4 to 6))., Disp: 30 tablet, Rfl: 0 .  pravastatin (PRAVACHOL) 80 MG tablet, Take 80 mg by mouth at bedtime., Disp: , Rfl:  .  Specialty Vitamins Products (MAGNESIUM, AMINO ACID CHELATE,) 133 MG tablet, Take by mouth., Disp: , Rfl:  .  vitamin C (ASCORBIC ACID) 500 MG tablet, Take 500 mg by mouth daily. , Disp: , Rfl:  .  amLODipine (NORVASC) 2.5 MG tablet, Take 1 tablet (2.5 mg total) by mouth daily., Disp: 180 tablet, Rfl: 3  Social History   Tobacco Use  Smoking Status Never Smoker  Smokeless Tobacco Never Used    Allergies  Allergen Reactions  . Atorvastatin Anaphylaxis     Patient does not remember; caused pneumonia- took her off of it per patient.   . Penicillins Anaphylaxis and Hives    PATIENT HAS HAD A PCN REACTION WITH IMMEDIATE RASH, FACIAL/TONGUE/THROAT SWELLING, SOB, OR LIGHTHEADEDNESS WITH HYPOTENSION:  #  #  #  YES  #  #  #   HAS PT DEVELOPED SEVERE RASH INVOLVING MUCUS MEMBRANES or SKIN NECROSIS: #  #  #  YES  #  #  #   PATIENT HAS HAD A PCN REACTION THAT REQUIRED HOSPITALIZATION:  #  #  #  YES  #  #  #  Has patient had a PCN reaction occurring within the last 10 years:#  #  #  YES  #  #  #    . Shellfish Allergy Anaphylaxis and Nausea And Vomiting    Severe nausea and vomiting  . Aminophylline Itching, Swelling and Rash    Pt experienced burning urination, itching, redness/rash, and swelling of genitals after given this medication via IV push.  . Iodinated Diagnostic Agents Swelling and Other (See Comments)    SWELLING REACTION UNSPECIFIED  fFLUSHING  . Latex Other (See Comments)    Causes blisters  . Oxybutynin Chloride Other (See Comments)    "blisters" "blisters"  . Vancomycin Hives    06/02/2018- Hives arm, back and chest  . Adhesive [Tape] Rash    Paper tape is ok  . Betadine [Povidone Iodine] Rash  . Ciprofloxacin Hives  . Codeine Nausea And Vomiting    .  Marland Kitchen Gelnique [Oxybutynin] Other (See Comments)    Causes blisters  . Ranolazine Other (See Comments)    Constipation  .   Objective:  There were no vitals filed for this visit. There is no height or weight on file to calculate BMI. Constitutional Well developed. Well nourished.  Vascular Dorsalis pedis pulses palpable bilaterally. Posterior tibial pulses palpable bilaterally. Capillary refill normal to all digits.  No cyanosis or clubbing noted. Pedal hair growth normal.  Neurologic Normal speech. Oriented to person, place, and time. Epicritic sensation to light touch grossly present bilaterally.  Dermatologic Nails well groomed and normal in appearance. No open  wounds. No skin lesions.  Orthopedic: POP R Ankle peroneal tendons POP R lateral ankle Local edema R lateral ankle No pain medial ankle   Radiographs: XR taken reviewed no acute fractures noted. Assessment:   1. Tear of peroneal tendon, right, sequela   2. Gait disturbance   3. Localized edema   4. Injury of right ankle, initial encounter    Plan:  Patient was evaluated and treated and all questions answered.  R Peroneal Sprain Sequela, Likely R Ankle Sprain, unlikely re-tear -  Will protect area in CAM walker boot to provide safe injury free ambulation. High fall risl. -Multicompression dressing applied for swelling. -F/u in 1 week for recheck.  Procedure: Multilayer Compression dressing Rationale: venous insufficiency Technique: Unna boot, cast padding, Coban compression dressing applied Disposition: Patient tolerated procedure well.   Return in about 1 week (around 10/09/2018).

## 2018-10-06 ENCOUNTER — Other Ambulatory Visit: Payer: Self-pay

## 2018-10-06 ENCOUNTER — Other Ambulatory Visit: Payer: Self-pay | Admitting: Podiatry

## 2018-10-06 DIAGNOSIS — S99911A Unspecified injury of right ankle, initial encounter: Secondary | ICD-10-CM

## 2018-10-06 DIAGNOSIS — Z634 Disappearance and death of family member: Secondary | ICD-10-CM | POA: Diagnosis not present

## 2018-10-06 DIAGNOSIS — F5101 Primary insomnia: Secondary | ICD-10-CM | POA: Diagnosis not present

## 2018-10-06 DIAGNOSIS — R531 Weakness: Secondary | ICD-10-CM | POA: Diagnosis not present

## 2018-10-06 DIAGNOSIS — M1990 Unspecified osteoarthritis, unspecified site: Secondary | ICD-10-CM | POA: Diagnosis not present

## 2018-10-06 NOTE — Patient Outreach (Signed)
  [facility]) Care Management Chronic Special Needs Program  10/06/2018  Name: Jill Preston DOB: 11/28/1932  MRN: [id number]  [name] is enrolled in a chronic special needs plan for Heart Failure. Chronic Care Management Coordinator telephoned client to review health risk assessment and to develop individualized care plan.  Introduced the chronic care management program, importance of client participation, and taking their care plan to all provider appointments and inpatient facilities.  Reviewed the transition of care process and possible referral to community care management.  Subjective: Client states that she knows when to call her doctor if she is having problems getting her fluid to go down.  States that she takes a fluid pill if her weight is over 208 lb.  States she gets short winded if she does very much but it gets better when she sits and rests.  States she usually only needs to take her NTG about once a year.  States that her husband helps her cook if needed.    Goals Addressed            This Visit's Progress   . Client understands the importance of follow-up with providers by attending scheduled visits      . Client will increase activity tolerance by  reported less SOB within 6 months       Pace activity Review [employer]     . Client will not report change from baseline and no repeated symptoms of stroke with in the next 12 months       . Client will report no worsening of symptoms related to heart disease within the next 6 months       . Client will verbalize knowledge of self management of Hypertension as evidences by BP reading of 140/90 or less; or as defined by provider      . Decrease inpatient Heart Failure admissions/ readmissions with in the year      . Decrease the use of hospital emergency department related to heart failure within the next year      . Maintain timely refills of Heart Failure medication as prescribed  within the year        [place] pharmacist will call you to review your medications    . Obtain annual  Lipid Profile, LDL-C      . Visit Primary Care Provider or Cardiologist at least 2 times per year       Client able to verbalize Heart Failure action plan with good teach back.  Client on greater than 8 medications plus supplements and will be referred to pharmacy for medication review.   Plan:  Send successful outreach letter with a copy of their individualized care plan, Send individual care plan to provider and Send educational material [place] with weight log  Chronic care management coordination will outreach in:  6 Months  Will refer client to:  Pharmacy for medication review for polypharmacy   Peter Garter RN, [facility], [place] Management [phone]

## 2018-10-08 ENCOUNTER — Other Ambulatory Visit: Payer: Self-pay | Admitting: Cardiovascular Disease

## 2018-10-08 ENCOUNTER — Ambulatory Visit: Payer: Self-pay | Admitting: Pharmacist

## 2018-10-08 DIAGNOSIS — R32 Unspecified urinary incontinence: Secondary | ICD-10-CM | POA: Diagnosis not present

## 2018-10-08 DIAGNOSIS — N904 Leukoplakia of vulva: Secondary | ICD-10-CM | POA: Diagnosis not present

## 2018-10-08 DIAGNOSIS — N3941 Urge incontinence: Secondary | ICD-10-CM | POA: Diagnosis not present

## 2018-10-08 DIAGNOSIS — R82998 Other abnormal findings in urine: Secondary | ICD-10-CM | POA: Diagnosis not present

## 2018-10-10 ENCOUNTER — Ambulatory Visit (INDEPENDENT_AMBULATORY_CARE_PROVIDER_SITE_OTHER): Payer: HMO | Admitting: Podiatry

## 2018-10-10 DIAGNOSIS — S86311S Strain of muscle(s) and tendon(s) of peroneal muscle group at lower leg level, right leg, sequela: Secondary | ICD-10-CM | POA: Diagnosis not present

## 2018-10-10 DIAGNOSIS — S99911D Unspecified injury of right ankle, subsequent encounter: Secondary | ICD-10-CM

## 2018-10-10 DIAGNOSIS — M659 Synovitis and tenosynovitis, unspecified: Secondary | ICD-10-CM

## 2018-10-10 DIAGNOSIS — R269 Unspecified abnormalities of gait and mobility: Secondary | ICD-10-CM | POA: Diagnosis not present

## 2018-10-10 DIAGNOSIS — R600 Localized edema: Secondary | ICD-10-CM | POA: Diagnosis not present

## 2018-10-11 ENCOUNTER — Encounter: Payer: Self-pay | Admitting: Podiatry

## 2018-10-11 NOTE — Progress Notes (Signed)
Subjective:  Patient ID: Jill Preston, female    DOB: [birth year],  MRN: [id number]  Chief Complaint  Patient presents with  . Ankle Pain    1 week follow up - not any better    85 y.o. female presents with the above complaint.  States that she is having a lot of pain still in the ankle having difficulty sleeping states that she has to wear the boot at night in order to not have any pain.  Review of Systems: Negative except as noted in the HPI. Denies N/V/F/Ch.  Past Medical History:  Diagnosis Date  . Arthritis    right knee  . Cancer [facility]) yrs ago   skin cancer removed from face  . Chronic diastolic CHF (congestive heart failure) ([place])   . Chronic venous insufficiency    LEA VENOUS, 10/17/2011 - mild reflux in bilateral common femoral veins  . CKD (chronic kidney disease), stage III ([place])   . Coronary artery disease    a. s/p PCI/BMS to prox LAD and balloon angioplasty to mLAD with suboptimal result in 2000. b. Abnl nuc 2012, cath 09/2011 - showed that the overall territory of potential ischemia was small and attributable to a moderately diseased small second diagonal artery. Med rx.  Marland Kitchen Dyspnea    with activity  . Headache   . History of blood transfusion 2017  . Hyperlipidemia   . Hypertension   . Hypertensive heart disease   . Hypothyroidism   . Obesity   . Pneumonia    several times  . Stroke [facility])    pt. states she had "light stroke" in sept. 1980  . Urinary incontinence     Current Outpatient Medications:  .  amLODipine (NORVASC) 2.5 MG tablet, Take 1 tablet (2.5 mg total) by mouth daily., Disp: 180 tablet, Rfl: 3 .  aspirin EC 81 MG tablet, Take 1 tablet (81 mg total) by mouth daily., Disp: 90 tablet, Rfl: 3 .  b complex vitamins tablet, Take by mouth., Disp: , Rfl:  .  bisacodyl (DULCOLAX) 5 MG EC tablet, Take 5 mg by mouth 2 (two) times daily., Disp: , Rfl:  .  Coenzyme Q10 (COQ10 PO), Take 2 capsules by mouth daily., Disp: , Rfl:  .  ezetimibe (ZETIA) 10  MG tablet, TAKE 1 TABLET BY MOUTH EVERY DAY (Patient taking differently: Take 10 mg by mouth daily. ), Disp: 90 tablet, Rfl: 1 .  furosemide (LASIX) 80 MG tablet, Take 80 mg by mouth. Take daily if weight is 209-214lbs. HOLD if weight is under 208lbs. Call office if weight greater than 215lbs., Disp: , Rfl:  .  isosorbide mononitrate (IMDUR) 30 MG 24 hr tablet, TAKE 2 TABLETS TWICE A DAY, Disp: 360 tablet, Rfl: 2 .  KLOR-CON M20 20 MEQ tablet, Take 40 mEq by mouth daily., Disp: , Rfl: 3 .  levothyroxine (SYNTHROID, LEVOTHROID) 112 MCG tablet, Take 112 mcg by mouth at bedtime. , Disp: , Rfl: 4 .  losartan-hydrochlorothiazide (HYZAAR) 100-12.5 MG tablet, TAKE 1 TABLET BY MOUTH EVERY DAY, Disp: 90 tablet, Rfl: 2 .  losartan-hydrochlorothiazide (HYZAAR) 100-25 MG tablet, TAKE 1/2 TABLET EVERY DAY, Disp: 45 tablet, Rfl: 1 .  Multiple Vitamin (MULTIVITAMIN WITH MINERALS) TABS tablet, Take 1 tablet by mouth daily. Centrum 50+ , Disp: , Rfl:  .  Multiple Vitamins-Minerals (MULTIVITAMIN WITH MINERALS) tablet, Take by mouth., Disp: , Rfl:  .  nitroGLYCERIN (NITROSTAT) 0.4 MG SL tablet, Place 1 tablet (0.4 mg total) under the tongue every 5 (  five) minutes as needed for chest pain., Disp: 25 tablet, Rfl: 2 .  oxyCODONE (OXY IR/ROXICODONE) 5 MG immediate release tablet, Take 1 tablet (5 mg total) by mouth every 3 (three) hours as needed for moderate pain ((score 4 to 6))., Disp: 30 tablet, Rfl: 0 .  pravastatin (PRAVACHOL) 80 MG tablet, Take 80 mg by mouth at bedtime., Disp: , Rfl:  .  Specialty Vitamins Products (MAGNESIUM, AMINO ACID CHELATE,) 133 MG tablet, Take by mouth., Disp: , Rfl:  .  vitamin C (ASCORBIC ACID) 500 MG tablet, Take 500 mg by mouth daily. , Disp: , Rfl:   Social History   Tobacco Use  Smoking Status Never Smoker  Smokeless Tobacco Never Used    Allergies  Allergen Reactions  . Atorvastatin Anaphylaxis    Patient does not remember; caused pneumonia- took her off of it per patient.     . Penicillins Anaphylaxis and Hives    PATIENT HAS HAD A PCN REACTION WITH IMMEDIATE RASH, FACIAL/TONGUE/THROAT SWELLING, SOB, OR LIGHTHEADEDNESS WITH HYPOTENSION:  #  #  #  YES  #  #  #   HAS PT DEVELOPED SEVERE RASH INVOLVING MUCUS MEMBRANES or SKIN NECROSIS: #  #  #  YES  #  #  #   PATIENT HAS HAD A PCN REACTION THAT REQUIRED HOSPITALIZATION:  #  #  #  YES  #  #  #  Has patient had a PCN reaction occurring within the last 10 years:#  #  #  YES  #  #  #    . Shellfish Allergy Anaphylaxis and Nausea And Vomiting    Severe nausea and vomiting  . Aminophylline Itching, Swelling and Rash    Pt experienced burning urination, itching, redness/rash, and swelling of genitals after given this medication via IV push.  . Iodinated Diagnostic Agents Swelling and Other (See Comments)    SWELLING REACTION UNSPECIFIED  fFLUSHING  . Latex Other (See Comments)    Causes blisters  . Oxybutynin Chloride Other (See Comments)    "blisters" "blisters"  . Vancomycin Hives    06/02/2018- Hives arm, back and chest  . Adhesive [Tape] Rash    Paper tape is ok  . Betadine [Povidone Iodine] Rash  . Ciprofloxacin Hives  . Codeine Nausea And Vomiting    .  Marland Kitchen Gelnique [Oxybutynin] Other (See Comments)    Causes blisters  . Ranolazine Other (See Comments)    Constipation  .   Objective:  There were no vitals filed for this visit. There is no height or weight on file to calculate BMI. Constitutional Well developed. Well nourished.  Vascular Dorsalis pedis pulses palpable bilaterally. Posterior tibial pulses palpable bilaterally. Capillary refill normal to all digits.  No cyanosis or clubbing noted. Pedal hair growth normal.  Neurologic Normal speech. Oriented to person, place, and time. Epicritic sensation to light touch grossly present bilaterally.  Dermatologic Nails well groomed and normal in appearance. No open wounds. No skin lesions.  Orthopedic: POP R Ankle peroneal tendons POP R lateral  ankle Local edema R lateral ankle No pain medial ankle   Radiographs: None today Assessment:   1. Localized edema   2. Tear of peroneal tendon, right, sequela   3. Gait disturbance   4. Injury of right ankle, subsequent encounter   5. Synovitis and tenosynovitis    Plan:  Patient was evaluated and treated and all questions answered.  R Peroneal Sprain Sequela, Likely R Ankle Sprain, unlikely re-tear -Reapply Unna boot  and compressive bandage as patient states that it did help with the pain.  Would benefit from the immobilization.  Continue immobilization in cam walker boot. -Follow-up in 4 weeks for recheck -Would consider referral to physical therapy should pain persist  Procedure: Multilayer Compression dressing Rationale: venous insufficiency Technique: Unna boot, cast padding, Coban compression dressing applied Disposition: Patient tolerated procedure well.   Return in about 4 weeks (around 11/07/2018) for Tendonitis.

## 2018-10-13 ENCOUNTER — Other Ambulatory Visit: Payer: Self-pay | Admitting: Pharmacist

## 2018-10-13 ENCOUNTER — Other Ambulatory Visit: Payer: Self-pay

## 2018-10-13 ENCOUNTER — Ambulatory Visit: Payer: Self-pay | Admitting: Pharmacist

## 2018-10-13 NOTE — Patient Outreach (Signed)
[facility]) Care Management  [place]  10/13/2018  Jill Preston 07-Nov-1932 [id number]   Reason for call: HTA-CSNP  Unsuccessful telephone call attempt #1 to patient.   HIPAA compliant voicemail left requesting a return call  Plan:  I will make another outreach attempt to patient within 3-4 business days   Regina Eck, PharmD, [place]  [phone]

## 2018-10-13 NOTE — Patient Outreach (Signed)
[facility]) Care Management  10/13/2018  Jill Preston 11-Jan-1933 [id number]   Referral Date: 10/13/2018 Referral Source: [facility]  Referral Reason: Has  headache and cannot sleep.   Outreach Attempt:spoke with patient.  Addressed red alert with patient.  She states that she feels much better.  She has no headache.  Patient denies any other questions or concerns.  Patient appreciative of follow up.  Plan: RN CM will sign off case.    Jone Baseman, RN, MSN [facility] Care Management Care Management Coordinator Direct Line [phone] Toll Free: [phone]  Fax: [phone]

## 2018-10-14 ENCOUNTER — Ambulatory Visit: Payer: Self-pay | Admitting: Pharmacist

## 2018-10-14 ENCOUNTER — Other Ambulatory Visit: Payer: Self-pay | Admitting: Pharmacist

## 2018-10-14 ENCOUNTER — Other Ambulatory Visit: Payer: Self-pay | Admitting: Cardiovascular Disease

## 2018-10-14 MED ORDER — FUROSEMIDE 80 MG PO TABS: 80.0000 mg | ORAL_TABLET | Freq: Every day | ORAL | 2 refills | Status: DC

## 2018-10-14 NOTE — Telephone Encounter (Signed)
Rx(s) sent to pharmacy electronically.  

## 2018-10-14 NOTE — Telephone Encounter (Signed)
 *  STAT* If patient is at the pharmacy, call can be transferred to refill team.   1. Which medications need to be refilled? (please list name of each medication and dose if known) furosemide (LASIX) 80 MG tablet  2. Which pharmacy/location (including street and city if local pharmacy) is medication to be sent to? [facility] [place]  3. Do they need a 30 day or 90 day supply? [place]

## 2018-10-14 NOTE — Patient Outreach (Signed)
[facility]) Care Management  [place]   10/14/2018  JACAYLA NORDELL 1933/01/15 [id number]  Reason for referral: Medication Management-HTA CSNP  Referral source: [facility] RN Current insurance:[employer]  PMHx includes but not limited to:  CAD, PAD, HTN, HLD, CHF (65-70%), hypothyroidism, CKD III  Outreach:  Incoming call from Ms. Widdowson with HIPAA identifiers verified.  Patient states she was delayed returning my call due to recent deaths in the family.  She states she is doing "okay" today.  She is agreeable to review medications telephonically. She states she has no issues affording medications at this time.  [facility].  She states she is compliant with medications.  She has been running low on lasix and is having to cut her current dose in half to "make it last".  She denies SOB and states she does have some lower extremity edema (more in the R leg d/t injury).  She also is questioning whether or not she should be on fenofibrate.     Objective: Lab Results  Component Value Date   CREATININE 1.26 (H) 06/03/2018   CREATININE 0.90 06/02/2018   CREATININE 0.90 03/26/2018    Lab Results  Component Value Date   HGBA1C 6.1 (H) 01/26/2015    Lipid Panel     Component Value Date/Time   CHOL 193 11/18/2017 0857   TRIG 212 (H) 11/18/2017 0857   HDL 47 11/18/2017 0857   CHOLHDL 4.1 11/18/2017 0857   CHOLHDL 4.2 06/27/2015 1336   VLDL 48 (H) 06/27/2015 1336   LDLCALC 104 (H) 11/18/2017 0857    BP Readings from Last 3 Encounters:  08/01/18 132/64  06/03/18 (!) 126/54  05/14/18 124/62    Allergies  Allergen Reactions  . Atorvastatin Anaphylaxis    Patient does not remember; caused pneumonia- took her off of it per patient.   . Penicillins Anaphylaxis and Hives    PATIENT HAS HAD A PCN REACTION WITH IMMEDIATE RASH, FACIAL/TONGUE/THROAT SWELLING, SOB, OR LIGHTHEADEDNESS WITH HYPOTENSION:  #  #  #  YES  #   #  #   HAS PT DEVELOPED SEVERE RASH INVOLVING MUCUS MEMBRANES or SKIN NECROSIS: #  #  #  YES  #  #  #   PATIENT HAS HAD A PCN REACTION THAT REQUIRED HOSPITALIZATION:  #  #  #  YES  #  #  #  Has patient had a PCN reaction occurring within the last 10 years:#  #  #  YES  #  #  #    . Shellfish Allergy Anaphylaxis and Nausea And Vomiting    Severe nausea and vomiting  . Aminophylline Itching, Swelling and Rash    Pt experienced burning urination, itching, redness/rash, and swelling of genitals after given this medication via IV push.  . Iodinated Diagnostic Agents Swelling and Other (See Comments)    SWELLING REACTION UNSPECIFIED  fFLUSHING  . Latex Other (See Comments)    Causes blisters  . Oxybutynin Chloride Other (See Comments)    "blisters" "blisters"  . Vancomycin Hives    06/02/2018- Hives arm, back and chest  . Adhesive [Tape] Rash    Paper tape is ok  . Betadine [Povidone Iodine] Rash  . Ciprofloxacin Hives  . Codeine Nausea And Vomiting    .  Marland Kitchen Gelnique [Oxybutynin] Other (See Comments)    Causes blisters  . Ranolazine Other (See Comments)    Constipation  .    Medications  Reviewed Today    Reviewed by Lavera Guise, [facility] (Pharmacist) on 10/14/18 at 1140  Med List Status: <None>  Medication Order Taking? Sig Documenting Provider Last Dose Status Informant  amLODipine (NORVASC) 2.5 MG tablet [id number] Yes Take 1 tablet (2.5 mg total) by mouth daily. Barrett, Evelene Croon, PA-C Taking Active Self  aspirin EC 81 MG tablet [id number] Yes Take 1 tablet (81 mg total) by mouth daily. Erlene Quan, PA-C Taking Active Self           Med Note Liliane Bade May 29, 2018 10:09 AM) On hold due to upcoming procedure  b complex vitamins tablet [id number] Yes Take by mouth. [provider] Taking Active   bisacodyl (DULCOLAX) 5 MG EC tablet [id number] Yes Take 5 mg by mouth 2 (two) times daily. [provider] Taking Active Self  Coenzyme Q10 (COQ10 PO)  [id number] Yes Take 2 capsules by mouth daily. [provider] Taking Active Self  ezetimibe (ZETIA) 10 MG tablet [id number] Yes TAKE 1 TABLET BY MOUTH EVERY DAY  Patient taking differently:  Take 10 mg by mouth daily.    Croitoru, Mihai, MD Taking Active Self  furosemide (LASIX) 80 MG tablet [id number] Yes Take 80 mg by mouth. Take daily if weight is 209-214lbs. HOLD if weight is under 208lbs. Call office if weight greater than 215lbs. [provider] Taking Active   isosorbide mononitrate (IMDUR) 30 MG 24 hr tablet [id number] Yes TAKE 2 TABLETS TWICE A DAY Leonie Man, MD Taking Active   KLOR-CON M20 20 MEQ tablet [id number] Yes Take 40 mEq by mouth daily. [provider] Taking Active Self  levothyroxine (SYNTHROID, LEVOTHROID) 112 MCG tablet [id number] Yes Take 112 mcg by mouth at bedtime.  [provider] Taking Active Self  losartan-hydrochlorothiazide (HYZAAR) 100-12.5 MG tablet [id number] Yes TAKE 1 TABLET BY MOUTH EVERY DAY Croitoru, Mihai, MD Taking Active         Discontinued 10/14/18 1140 (Change in therapy)   Multiple Vitamin (MULTIVITAMIN WITH MINERALS) TABS tablet [id number] Yes Take 1 tablet by mouth daily. Centrum 50+  [provider] Taking Active Self  Multiple Vitamins-Minerals (MULTIVITAMIN WITH MINERALS) tablet [id number] Yes Take by mouth. [provider] Taking Active   nitroGLYCERIN (NITROSTAT) 0.4 MG SL tablet [id number] Yes Place 1 tablet (0.4 mg total) under the tongue every 5 (five) minutes as needed for chest pain. Croitoru, Mihai, MD Taking Active Self  oxyCODONE (OXY IR/ROXICODONE) 5 MG immediate release tablet [id number] Yes Take 1 tablet (5 mg total) by mouth every 3 (three) hours as needed for moderate pain ((score 4 to 6)). Newman Pies, MD Taking Active   pravastatin (PRAVACHOL) 80 MG tablet [id number] Yes Take 80 mg by mouth at bedtime. [provider] Taking Active Self  Specialty Vitamins Products  (MAGNESIUM, AMINO ACID CHELATE,) 133 MG tablet [id number] Yes Take by mouth. [provider] Taking Active   vitamin C (ASCORBIC ACID) 500 MG tablet [id number] Yes Take 500 mg by mouth daily.  [provider] Taking Active Self          Assessment:  Drugs sorted by system  Cardiovascular: ASA, furosemide, amlodipine, Zetia, isosorbide, losartan/HCTZ, pravastatin, nitrates PRN  Gastrointestinal: bisacodyl  Endocrine: levothyroxine  Pain: oxycodone  Infectious Diseases:  Macrobid (UTI-will finish on 10/16/2018)  Vitamins/Minerals/Supplements: MVI, potassium, vitB, COQ10, Magnesium, vitamin C  Medication Review Findings:  . Patient still taking fenofibrate, however Dr. Victorino December note from 07/2018 states that "Mildly elevated triglycerides do  not justify fibrate therapy, especially in a patient with renal insufficiency.  Patient encouraged to follow most recent instructions from Dr. Loletha Grayer and most recent medication list which does not have fenofibrate listed.  Encouraged patient to call Dr. Lurline Del office if questions remain. . Patient to finish ABx for UTI on 10/16/2018.  Counseled patient to finish medication.  She reports some GI upset, therefore encouraged patient to take with food. . Duplicate medications:  Patient getting losartan/HCTZ 100-25mg  and the 100-12.5mg .  Patient is not taking the 100-25mg  strength.  Removed from medication list [facility]. . Patient is out of LASIX.  She has been cutting her dose in half to "make it last".  Her weights have been up so she is taking more.  Called [facility] to have new RX called in.   Plan: -I will follow up with patient later today once refills called in. -I will follow up with patient in 2 weeks    Regina Eck, PharmD, [place]  [phone]

## 2018-10-15 ENCOUNTER — Ambulatory Visit: Payer: Self-pay | Admitting: Pharmacist

## 2018-10-15 DIAGNOSIS — Z6835 Body mass index (BMI) 35.0-35.9, adult: Secondary | ICD-10-CM | POA: Diagnosis not present

## 2018-10-15 DIAGNOSIS — M435X6 Other recurrent vertebral dislocation, lumbar region: Secondary | ICD-10-CM | POA: Diagnosis not present

## 2018-10-15 DIAGNOSIS — M5126 Other intervertebral disc displacement, lumbar region: Secondary | ICD-10-CM | POA: Diagnosis not present

## 2018-10-20 ENCOUNTER — Telehealth: Payer: Self-pay | Admitting: Podiatry

## 2018-10-20 ENCOUNTER — Encounter: Payer: Self-pay | Admitting: Sports Medicine

## 2018-10-20 ENCOUNTER — Ambulatory Visit: Payer: HMO | Admitting: Sports Medicine

## 2018-10-20 DIAGNOSIS — R600 Localized edema: Secondary | ICD-10-CM | POA: Diagnosis not present

## 2018-10-20 DIAGNOSIS — M25571 Pain in right ankle and joints of right foot: Secondary | ICD-10-CM | POA: Diagnosis not present

## 2018-10-20 DIAGNOSIS — S86311S Strain of muscle(s) and tendon(s) of peroneal muscle group at lower leg level, right leg, sequela: Secondary | ICD-10-CM | POA: Diagnosis not present

## 2018-10-20 NOTE — Telephone Encounter (Signed)
I called pt and she states she took the boot off and still has the soft unna on in her athletic shoe. I asked pt if the calf was sore, hard, red and she states it is very swollen below the knee, and the pain feels like the tendon pain. I offered pt an appt and she accepted today at 3:30pm.

## 2018-10-20 NOTE — Progress Notes (Signed)
Subjective:  Jill Preston is a 85 y.o. female patient who presents to office for evaluation of * ankle pain. Patient complains of continued pain in the ankle. Patient has tried * with no relief in symptoms. Patient denies any other pedal complaints. Denies injury/trip/fall/sprain/any causative factors.   Patient Active Problem List   Diagnosis Date Noted  . PAD (peripheral artery disease) ([place]) 08/01/2018  . Herniated intervertebral disc of lumbar spine 06/02/2018  . Tear of left rotator cuff 10/29/2017  . Bilateral leg pain 01/27/2017  . Cervical spine degeneration 10/23/2015  . Hypersomnolence 10/23/2015  . S/P IVC filter 02/15/2015  . Angina pectoris ([place]) 01/26/2015  . CAD in native artery   . Ischemic chest pain ([place])   . Edema of left lower extremity 09/16/2014  . Acute thromboembolism of deep veins of lower extremity ([place]) 09/14/2014  . Acute deep vein thrombosis of left lower extremity ([place]) 09/14/2014  . Anemia associated with acute blood loss 09/14/2014  . CHF (congestive heart failure) ([place]) 09/05/2014  . Subclavian artery stenosis, left ([place]) 08/04/2014  . Chronic diastolic CHF (congestive heart failure) ([place]) 07/16/2014  . Essential hypertension 07/08/2014  . CKD (chronic kidney disease), stage III ([place])   . Hypertensive heart disease   . Obesity, Class II, BMI 35-39.9, with comorbidity   . Chronic venous insufficiency   . Chronic diastolic heart failure ([place]) 07/04/2014  . Metatarsal deformity 06/24/2013  . CAD - LAD stent 12/00. Patent '02 and 1/13, moderate D2 managed medically 02/26/2013  . Dyslipidemia 02/26/2013  . DJD (degenerative joint disease)- back 02/26/2013  . Vaginal candidiasis 10/23/2012  . Asthma 08/06/2012  . Hyperlipidemia 08/05/2012  . Lichen sclerosus 74/25/9563  . Rectocele 06/03/2012  . Recurrent UTI 06/03/2012  . Urge urinary incontinence 06/03/2012  . Voiding dysfunction 06/03/2012  . Hypothyroidism 07/23/2011  . Accelerated  hypertension 07/23/2011    Current Outpatient Medications on File Prior to Visit  Medication Sig Dispense Refill  . amLODipine (NORVASC) 2.5 MG tablet Take 1 tablet (2.5 mg total) by mouth daily. 180 tablet 3  . aspirin EC 81 MG tablet Take 1 tablet (81 mg total) by mouth daily. 90 tablet 3  . b complex vitamins tablet Take by mouth.    . bisacodyl (DULCOLAX) 5 MG EC tablet Take 5 mg by mouth 2 (two) times daily.    . Coenzyme Q10 (COQ10 PO) Take 2 capsules by mouth daily.    Marland Kitchen ezetimibe (ZETIA) 10 MG tablet TAKE 1 TABLET BY MOUTH EVERY DAY (Patient taking differently: Take 10 mg by mouth daily. ) 90 tablet 1  . furosemide (LASIX) 80 MG tablet Take 1 tablet (80 mg total) by mouth daily. Take daily if weight is 209-214lbs. HOLD if weight is under 208lbs. Call office if weight greater than 215lbs. 90 tablet 2  . isosorbide mononitrate (IMDUR) 30 MG 24 hr tablet TAKE 2 TABLETS TWICE A DAY 360 tablet 2  . KLOR-CON M20 20 MEQ tablet Take 40 mEq by mouth daily.  3  . levothyroxine (SYNTHROID, LEVOTHROID) 112 MCG tablet Take 112 mcg by mouth at bedtime.   4  . losartan-hydrochlorothiazide (HYZAAR) 100-12.5 MG tablet TAKE 1 TABLET BY MOUTH EVERY DAY 90 tablet 2  . Multiple Vitamin (MULTIVITAMIN WITH MINERALS) TABS tablet Take 1 tablet by mouth daily. Centrum 50+     . Multiple Vitamins-Minerals (MULTIVITAMIN WITH MINERALS) tablet Take by mouth.    . nitroGLYCERIN (NITROSTAT) 0.4 MG SL tablet Place 1 tablet (0.4 mg total) under the  tongue every 5 (five) minutes as needed for chest pain. 25 tablet 2  . oxyCODONE (OXY IR/ROXICODONE) 5 MG immediate release tablet Take 1 tablet (5 mg total) by mouth every 3 (three) hours as needed for moderate pain ((score 4 to 6)). 30 tablet 0  . pravastatin (PRAVACHOL) 80 MG tablet Take 80 mg by mouth at bedtime.    Marland Kitchen Specialty Vitamins Products (MAGNESIUM, AMINO ACID CHELATE,) 133 MG tablet Take by mouth.    . vitamin C (ASCORBIC ACID) 500 MG tablet Take 500 mg by mouth  daily.      No current facility-administered medications on file prior to visit.     Allergies  Allergen Reactions  . Atorvastatin Anaphylaxis    Patient does not remember; caused pneumonia- took her off of it per patient.   . Penicillins Anaphylaxis and Hives    PATIENT HAS HAD A PCN REACTION WITH IMMEDIATE RASH, FACIAL/TONGUE/THROAT SWELLING, SOB, OR LIGHTHEADEDNESS WITH HYPOTENSION:  #  #  #  YES  #  #  #   HAS PT DEVELOPED SEVERE RASH INVOLVING MUCUS MEMBRANES or SKIN NECROSIS: #  #  #  YES  #  #  #   PATIENT HAS HAD A PCN REACTION THAT REQUIRED HOSPITALIZATION:  #  #  #  YES  #  #  #  Has patient had a PCN reaction occurring within the last 10 years:#  #  #  YES  #  #  #    . Shellfish Allergy Anaphylaxis and Nausea And Vomiting    Severe nausea and vomiting  . Aminophylline Itching, Swelling and Rash    Pt experienced burning urination, itching, redness/rash, and swelling of genitals after given this medication via IV push.  . Iodinated Diagnostic Agents Swelling and Other (See Comments)    SWELLING REACTION UNSPECIFIED  fFLUSHING  . Latex Other (See Comments)    Causes blisters  . Oxybutynin Chloride Other (See Comments)    "blisters" "blisters"  . Vancomycin Hives    06/02/2018- Hives arm, back and chest  . Adhesive [Tape] Rash    Paper tape is ok  . Betadine [Povidone Iodine] Rash  . Ciprofloxacin Hives  . Codeine Nausea And Vomiting    .  Marland Kitchen Gelnique [Oxybutynin] Other (See Comments)    Causes blisters  . Ranolazine Other (See Comments)    Constipation  .    Objective:  General: Alert and oriented x3 in no acute distress  Dermatology: Old surgery scars right ankle well healed, No open lesions bilateral lower extremities, no webspace macerations, no ecchymosis bilateral, all nails x 10 are well manicured.  Vascular: Dorsalis Pedis and Posterior Tibial pedal pulses palpable, Capillary Fill Time 5 seconds,Scant pedal hair growth bilateral, Trace edema bilateral  lower extremities with mild increase at right ankle, + Varicosities, Temperature gradient within normal limits.  Neurology: Johney Maine sensation intact via light touch bilateral.  Musculoskeletal: Mild tenderness with palpation at lateral and anterior ankle on right, Range of motion within normal limits with mild guarding on Right ankle. Strength within normal limits in all groups bilateral.   Gait: Antalgic gait   Assessment and Plan: Problem List Items Addressed This Visit    None    Visit Diagnoses    Localized edema    -  Primary   Tear of peroneal tendon, right, sequela       Acute right ankle pain           -Complete examination performed -Discussed treatement  options for continued pain in right ankle -Rx PT per Dr. Eleanora Neighbor last note -Removed unna boot and dispensed compression sleeve and advised patient to return to cam boot until she starts PT -May try OTC pain creams, rest, ice, elevation PRN -Patient to return to office after starting PT to see Dr. March Rummage or sooner if condition worsens.  Landis Martins, DPM

## 2018-10-20 NOTE — Telephone Encounter (Signed)
Pt wanting to speak with nurse, having some trouble with her foot. Pt stated that she took her cast off because it was hurting. Please give pt a call.

## 2018-10-21 ENCOUNTER — Telehealth: Payer: Self-pay | Admitting: *Deleted

## 2018-10-21 DIAGNOSIS — S86311S Strain of muscle(s) and tendon(s) of peroneal muscle group at lower leg level, right leg, sequela: Secondary | ICD-10-CM

## 2018-10-21 DIAGNOSIS — M25571 Pain in right ankle and joints of right foot: Secondary | ICD-10-CM

## 2018-10-21 DIAGNOSIS — R600 Localized edema: Secondary | ICD-10-CM

## 2018-10-21 DIAGNOSIS — Z9889 Other specified postprocedural states: Secondary | ICD-10-CM

## 2018-10-21 NOTE — Telephone Encounter (Signed)
I called pt, she is having so much pain and cramping in the right calf and shin, slept in her compression sock from 10:00pm to about 3:00am, woke with terrible pain, then was up and around and put the compression sock back on and about 10:00am it was so painful she had to take it off again. I informed Dr. Cannon Kettle of pt's complaints and she recommending venous doppler. I informed pt and she states understanding.

## 2018-10-21 NOTE — Telephone Encounter (Signed)
I was seen yesterday and had a compression sock put on. I had to take it off because I could not stand the pressure as it was hurting so bad. I don't think I can go through any therapy with it hurting like that. Please call me and let me know something.

## 2018-10-21 NOTE — Telephone Encounter (Signed)
Faxed orders to [facility]. 

## 2018-10-21 NOTE — Telephone Encounter (Signed)
-----   Message from Jill Preston, [place] sent at 10/20/2018  7:24 PM EST ----- Regarding: PT for Dr. March Rummage Patient Continued ankle pain after previous tendon tear with repair with Dr March Rummage. Start PT 2x wk for 4 weeks

## 2018-10-21 NOTE — Addendum Note (Signed)
Addended by: Harriett Sine D on: 10/21/2018 01:44 PM   Modules accepted: Orders

## 2018-10-21 NOTE — Telephone Encounter (Signed)
I hand delivered PT to [facility] - In-office.

## 2018-10-22 ENCOUNTER — Telehealth: Payer: Self-pay | Admitting: *Deleted

## 2018-10-22 ENCOUNTER — Ambulatory Visit (HOSPITAL_COMMUNITY)
Admission: RE | Admit: 2018-10-22 | Discharge: 2018-10-22 | Disposition: A | Discharge status: Home / Self Care | Payer: HMO | Source: Ambulatory Visit | Attending: Cardiology | Admitting: Cardiology

## 2018-10-22 ENCOUNTER — Encounter (HOSPITAL_COMMUNITY): Payer: Self-pay

## 2018-10-22 DIAGNOSIS — S86311S Strain of muscle(s) and tendon(s) of peroneal muscle group at lower leg level, right leg, sequela: Secondary | ICD-10-CM | POA: Diagnosis not present

## 2018-10-22 DIAGNOSIS — R600 Localized edema: Secondary | ICD-10-CM

## 2018-10-22 DIAGNOSIS — Z9889 Other specified postprocedural states: Secondary | ICD-10-CM

## 2018-10-22 DIAGNOSIS — M25571 Pain in right ankle and joints of right foot: Secondary | ICD-10-CM | POA: Diagnosis not present

## 2018-10-22 NOTE — Telephone Encounter (Signed)
I informed pt of Dr. Leeanne Rio review of the venous doppler and orders. Pt states understanding and states she almost fell leaving the testing site and he husband caught her. I told pt I was very happy she did fall and she should rest.

## 2018-10-22 NOTE — Telephone Encounter (Signed)
-----   Message from Landis Martins, [place] sent at 10/22/2018 11:13 AM EST ----- No blood clot. Advised patient to continue with rest, ice and elevation to help with swelling and pain  -Dr. Cannon Kettle

## 2018-10-22 NOTE — Telephone Encounter (Signed)
Jill Preston - [facility] states pt is negative for right calf DVT.

## 2018-10-22 NOTE — Progress Notes (Unsigned)
STAT lower extremity venous duplex exam performed for right calf pain and swelling. NEGATIVE preliminary result called to RN, Mateo Flow at ordering provider's office.

## 2018-10-27 ENCOUNTER — Telehealth: Payer: Self-pay | Admitting: Cardiovascular Disease

## 2018-10-27 ENCOUNTER — Ambulatory Visit: Payer: Self-pay | Admitting: Pharmacist

## 2018-10-27 DIAGNOSIS — J40 Bronchitis, not specified as acute or chronic: Secondary | ICD-10-CM | POA: Diagnosis not present

## 2018-10-27 DIAGNOSIS — R0602 Shortness of breath: Secondary | ICD-10-CM | POA: Diagnosis not present

## 2018-10-27 DIAGNOSIS — R5383 Other fatigue: Secondary | ICD-10-CM | POA: Diagnosis not present

## 2018-10-27 DIAGNOSIS — R05 Cough: Secondary | ICD-10-CM | POA: Diagnosis not present

## 2018-10-27 NOTE — Telephone Encounter (Signed)
Spoke with pt and over the last 4- 5 weeks has had deep cough ,chest tightness, SOB with activity and up 7 pounds since last WED no fever. Per pt just started Furosemide the other day at 80 mg and night before last took an additional 40 mg in pm with some relief Per pt is going to PMD today and get CXR Will forward to Dr Sallyanne Kuster for review and recommendations ./cy

## 2018-10-27 NOTE — Telephone Encounter (Signed)
Pt c/o swelling: STAT is pt has developed SOB within 24 hours  1) How much weight have you gained and in what time span? 7 pounds in 4 days.  2) If swelling, where is the swelling located? Mid section and legs feet.  3) Are you currently taking a fluid pill? Yes.   4) Are you currently SOB? Yes.   5) Do you have a log of your daily weights (if so, list)? Yes awhile ago  6) Have you gained 3 pounds in a day or 5 pounds in a week? Yes   7) Have you traveled recently? No

## 2018-10-28 NOTE — Telephone Encounter (Signed)
I would stay on that 80 mg dose of furosemide until she is back to her baseline weight. Then can retreat to the usual Rx, but keep monitoring daily for weight increase and can adjust the dose back to 80 mg whenever she is 3 lb or more above the baseline.Jill Preston

## 2018-10-28 NOTE — Telephone Encounter (Signed)
Advised patient, verbalized understanding. Will call back if she does not continue to see improvement or weight goes up.

## 2018-10-28 NOTE — Telephone Encounter (Signed)
Patient was calling to check status of yesterday's message, as she has not heard back.

## 2018-10-29 ENCOUNTER — Ambulatory Visit: Payer: Self-pay | Admitting: Pharmacist

## 2018-10-29 DIAGNOSIS — M25571 Pain in right ankle and joints of right foot: Secondary | ICD-10-CM | POA: Diagnosis not present

## 2018-10-29 DIAGNOSIS — R269 Unspecified abnormalities of gait and mobility: Secondary | ICD-10-CM | POA: Diagnosis not present

## 2018-10-29 DIAGNOSIS — M62561 Muscle wasting and atrophy, not elsewhere classified, right lower leg: Secondary | ICD-10-CM | POA: Diagnosis not present

## 2018-10-29 DIAGNOSIS — M25471 Effusion, right ankle: Secondary | ICD-10-CM | POA: Diagnosis not present

## 2018-11-04 DIAGNOSIS — I2510 Atherosclerotic heart disease of native coronary artery without angina pectoris: Secondary | ICD-10-CM | POA: Diagnosis not present

## 2018-11-04 DIAGNOSIS — I5032 Chronic diastolic (congestive) heart failure: Secondary | ICD-10-CM | POA: Diagnosis not present

## 2018-11-04 DIAGNOSIS — E782 Mixed hyperlipidemia: Secondary | ICD-10-CM | POA: Diagnosis not present

## 2018-11-04 DIAGNOSIS — Z0000 Encounter for general adult medical examination without abnormal findings: Secondary | ICD-10-CM | POA: Diagnosis not present

## 2018-11-04 DIAGNOSIS — I10 Essential (primary) hypertension: Secondary | ICD-10-CM | POA: Diagnosis not present

## 2018-11-06 ENCOUNTER — Ambulatory Visit: Payer: PPO | Admitting: Podiatry

## 2018-11-06 DIAGNOSIS — M7671 Peroneal tendinitis, right leg: Secondary | ICD-10-CM

## 2018-11-06 DIAGNOSIS — R269 Unspecified abnormalities of gait and mobility: Secondary | ICD-10-CM

## 2018-11-06 DIAGNOSIS — R600 Localized edema: Secondary | ICD-10-CM

## 2018-11-11 DIAGNOSIS — E782 Mixed hyperlipidemia: Secondary | ICD-10-CM | POA: Diagnosis not present

## 2018-11-11 DIAGNOSIS — I130 Hypertensive heart and chronic kidney disease with heart failure and stage 1 through stage 4 chronic kidney disease, or unspecified chronic kidney disease: Secondary | ICD-10-CM | POA: Diagnosis not present

## 2018-11-11 DIAGNOSIS — N183 Chronic kidney disease, stage 3 (moderate): Secondary | ICD-10-CM | POA: Diagnosis not present

## 2018-11-11 DIAGNOSIS — I5032 Chronic diastolic (congestive) heart failure: Secondary | ICD-10-CM | POA: Diagnosis not present

## 2018-11-11 DIAGNOSIS — Z0000 Encounter for general adult medical examination without abnormal findings: Secondary | ICD-10-CM | POA: Diagnosis not present

## 2018-11-11 DIAGNOSIS — I10 Essential (primary) hypertension: Secondary | ICD-10-CM | POA: Diagnosis not present

## 2018-11-11 DIAGNOSIS — E039 Hypothyroidism, unspecified: Secondary | ICD-10-CM | POA: Diagnosis not present

## 2018-11-11 DIAGNOSIS — I2510 Atherosclerotic heart disease of native coronary artery without angina pectoris: Secondary | ICD-10-CM | POA: Diagnosis not present

## 2018-11-11 DIAGNOSIS — I739 Peripheral vascular disease, unspecified: Secondary | ICD-10-CM | POA: Diagnosis not present

## 2018-11-12 ENCOUNTER — Ambulatory Visit: Payer: Self-pay | Admitting: Pharmacist

## 2018-11-12 ENCOUNTER — Other Ambulatory Visit: Payer: Self-pay | Admitting: Pharmacist

## 2018-11-12 NOTE — Patient Outreach (Signed)
[facility]) Care Management  [place]  11/12/2018  RONDELL FRICK [birth year] [id number]   Reason for call: medication management/Routine patient outreach for HTA CSNP Patient  Unsuccessful telephone call attempt to patient.   HIPAA compliant voicemail left requesting a return call  Plan:  I will make another outreach attempt to patient within 3-4 business days    Regina Eck, PharmD, [place]  [phone]

## 2018-11-13 ENCOUNTER — Other Ambulatory Visit: Payer: Self-pay | Admitting: Pharmacist

## 2018-11-13 NOTE — Patient Outreach (Signed)
[facility]) Care Management  [place]   11/13/2018  Jill Preston 06-20-1933 [id number]  Reason for call: medication management/Routine patient outreach for HTA CSNP Patient (CHF 65-70%)  PMHx includes but not limited to:  HTN, HLD, CHF, PAD, hypothyroidsim  Outreach:  Successful telephone call with Jill Preston.  HIPAA identifiers verified.  Patient states she is doing well today.  Denies SOB or edema above baseline.  She continues to take medications as prescribed.  She states she is frustrated with her pharmacy ([facility]).  [place] offered to transfer medication to another pharmacy or potentially mail order, however patient will give them one more chance.  Medications reviewed and patient has all medications that she needs in her possession.  Daily weights reported to be within goal range.  Patient knows to call MD cards office when weight is >215lbs.  Encouraged patient to reach out if additional pharmacy needs arise.    Objective: Lab Results  Component Value Date   CREATININE 1.26 (H) 06/03/2018   CREATININE 0.90 06/02/2018   CREATININE 0.90 03/26/2018    Lab Results  Component Value Date   HGBA1C 6.1 (H) 01/26/2015    Lipid Panel     Component Value Date/Time   CHOL 193 11/18/2017 0857   TRIG 212 (H) 11/18/2017 0857   HDL 47 11/18/2017 0857   CHOLHDL 4.1 11/18/2017 0857   CHOLHDL 4.2 06/27/2015 1336   VLDL 48 (H) 06/27/2015 1336   LDLCALC 104 (H) 11/18/2017 0857    BP Readings from Last 3 Encounters:  08/01/18 132/64  06/03/18 (!) 126/54  05/14/18 124/62    Allergies  Allergen Reactions  . Atorvastatin Anaphylaxis    Patient does not remember; caused pneumonia- took her off of it per patient.   . Penicillins Anaphylaxis and Hives    PATIENT HAS HAD A PCN REACTION WITH IMMEDIATE RASH, FACIAL/TONGUE/THROAT SWELLING, SOB, OR LIGHTHEADEDNESS WITH HYPOTENSION:  #  #  #  YES  #  #  #   HAS PT DEVELOPED SEVERE RASH  INVOLVING MUCUS MEMBRANES or SKIN NECROSIS: #  #  #  YES  #  #  #   PATIENT HAS HAD A PCN REACTION THAT REQUIRED HOSPITALIZATION:  #  #  #  YES  #  #  #  Has patient had a PCN reaction occurring within the last 10 years:#  #  #  YES  #  #  #    . Shellfish Allergy Anaphylaxis and Nausea And Vomiting    Severe nausea and vomiting  . Aminophylline Itching, Swelling and Rash    Pt experienced burning urination, itching, redness/rash, and swelling of genitals after given this medication via IV push.  . Iodinated Diagnostic Agents Swelling and Other (See Comments)    SWELLING REACTION UNSPECIFIED  fFLUSHING  . Latex Other (See Comments)    Causes blisters  . Oxybutynin Chloride Other (See Comments)    "blisters" "blisters"  . Vancomycin Hives    06/02/2018- Hives arm, back and chest  . Adhesive [Tape] Rash    Paper tape is ok  . Betadine [Povidone Iodine] Rash  . Ciprofloxacin Hives  . Codeine Nausea And Vomiting    .  Marland Kitchen Gelnique [Oxybutynin] Other (See Comments)    Causes blisters  . Ranolazine Other (See Comments)    Constipation  .    Medications Reviewed Today    Reviewed by Lavera Guise, [facility] (Pharmacist) on 11/17/18 at [place] [facility]  Status: <None>  Medication Order Taking? Sig Documenting Provider Last Dose Status Informant  amLODipine (NORVASC) 2.5 MG tablet [id number] Yes Take 1 tablet (2.5 mg total) by mouth daily. Barrett, Evelene Croon, PA-C Taking Active Self  aspirin EC 81 MG tablet [id number] Yes Take 1 tablet (81 mg total) by mouth daily. Erlene Quan, PA-C Taking Active Self           Med Note Liliane Bade May 29, 2018 10:09 AM) On hold due to upcoming procedure  b complex vitamins tablet [id number] Yes Take by mouth. [provider] Taking Active   bisacodyl (DULCOLAX) 5 MG EC tablet [id number] Yes Take 5 mg by mouth 2 (two) times daily. [provider] Taking Active Self  Coenzyme Q10 (COQ10 PO) [id number] Yes Take 2 capsules by mouth  daily. [provider] Taking Active Self  ezetimibe (ZETIA) 10 MG tablet [id number] Yes TAKE 1 TABLET BY MOUTH EVERY DAY  Patient taking differently:  Take 10 mg by mouth daily.    Croitoru, Mihai, MD Taking Active Self  furosemide (LASIX) 80 MG tablet [id number] Yes Take 1 tablet (80 mg total) by mouth daily. Take daily if weight is 209-214lbs. HOLD if weight is under 208lbs. Call office if weight greater than 215lbs. Croitoru, Mihai, MD Taking Active   isosorbide mononitrate (IMDUR) 30 MG 24 hr tablet [id number] Yes TAKE 2 TABLETS TWICE A DAY Leonie Man, MD Taking Active   KLOR-CON M20 20 MEQ tablet [id number] Yes Take 40 mEq by mouth daily. [provider] Taking Active Self  levothyroxine (SYNTHROID, LEVOTHROID) 112 MCG tablet [id number] Yes Take 112 mcg by mouth at bedtime.  [provider] Taking Active Self  losartan-hydrochlorothiazide (HYZAAR) 100-12.5 MG tablet [id number]  TAKE 1 TABLET BY MOUTH EVERY DAY Croitoru, Mihai, MD  Active   Multiple Vitamin (MULTIVITAMIN WITH MINERALS) TABS tablet [id number] Yes Take 1 tablet by mouth daily. Centrum 50+  [provider] Taking Active Self        Discontinued 11/17/18 0932 (No longer needed (for PRN medications))   nitroGLYCERIN (NITROSTAT) 0.4 MG SL tablet [id number] Yes Place 1 tablet (0.4 mg total) under the tongue every 5 (five) minutes as needed for chest pain. Croitoru, Mihai, MD Taking Active Self  oxyCODONE (OXY IR/ROXICODONE) 5 MG immediate release tablet [id number] Yes Take 1 tablet (5 mg total) by mouth every 3 (three) hours as needed for moderate pain ((score 4 to 6)). Newman Pies, MD Taking Active   pravastatin (PRAVACHOL) 80 MG tablet [id number] Yes Take 80 mg by mouth at bedtime. [provider] Taking Active Self  Specialty Vitamins Products (MAGNESIUM, AMINO ACID CHELATE,) 133 MG tablet [id number] Yes Take by mouth. [provider] Taking Active   vitamin C (ASCORBIC ACID) 500  MG tablet [id number] Yes Take 500 mg by mouth daily.  [provider] Taking Active Self           Plan: -I will follow up with HTA CSNP patient quarterly -Patient to f/u repeat Scr to ensure within range to continue safely on ARB.  Regina Eck, PharmD, [place]  [phone]

## 2018-11-14 ENCOUNTER — Ambulatory Visit: Payer: Self-pay | Admitting: Pharmacist

## 2018-11-15 NOTE — Progress Notes (Signed)
Subjective:  Patient ID: Jill Preston, female    DOB: May 22, 1933,  MRN: [id number]  No chief complaint on file.   86 y.o. female presents with the above complaint.  States that the pain is about the same has been wearing the boot as prescribed  Review of Systems: Negative except as noted in the HPI. Denies N/V/F/Ch.  Past Medical History:  Diagnosis Date  . Arthritis    right knee  . Cancer [facility]) yrs ago   skin cancer removed from face  . Chronic diastolic CHF (congestive heart failure) ([place])   . Chronic venous insufficiency    LEA VENOUS, 10/17/2011 - mild reflux in bilateral common femoral veins  . CKD (chronic kidney disease), stage III ([place])   . Coronary artery disease    a. s/p PCI/BMS to prox LAD and balloon angioplasty to mLAD with suboptimal result in 2000. b. Abnl nuc 2012, cath 09/2011 - showed that the overall territory of potential ischemia was small and attributable to a moderately diseased small second diagonal artery. Med rx.  Marland Kitchen Dyspnea    with activity  . Headache   . History of blood transfusion 2017  . Hyperlipidemia   . Hypertension   . Hypertensive heart disease   . Hypothyroidism   . Obesity   . Pneumonia    several times  . Stroke [facility])    pt. states she had "light stroke" in sept. 1980  . Urinary incontinence     Current Outpatient Medications:  .  amLODipine (NORVASC) 2.5 MG tablet, Take 1 tablet (2.5 mg total) by mouth daily., Disp: 180 tablet, Rfl: 3 .  aspirin EC 81 MG tablet, Take 1 tablet (81 mg total) by mouth daily., Disp: 90 tablet, Rfl: 3 .  b complex vitamins tablet, Take by mouth., Disp: , Rfl:  .  bisacodyl (DULCOLAX) 5 MG EC tablet, Take 5 mg by mouth 2 (two) times daily., Disp: , Rfl:  .  Coenzyme Q10 (COQ10 PO), Take 2 capsules by mouth daily., Disp: , Rfl:  .  ezetimibe (ZETIA) 10 MG tablet, TAKE 1 TABLET BY MOUTH EVERY DAY (Patient taking differently: Take 10 mg by mouth daily. ), Disp: 90 tablet, Rfl: 1 .  furosemide (LASIX)  80 MG tablet, Take 1 tablet (80 mg total) by mouth daily. Take daily if weight is 209-214lbs. HOLD if weight is under 208lbs. Call office if weight greater than 215lbs., Disp: 90 tablet, Rfl: 2 .  isosorbide mononitrate (IMDUR) 30 MG 24 hr tablet, TAKE 2 TABLETS TWICE A DAY, Disp: 360 tablet, Rfl: 2 .  KLOR-CON M20 20 MEQ tablet, Take 40 mEq by mouth daily., Disp: , Rfl: 3 .  levothyroxine (SYNTHROID, LEVOTHROID) 112 MCG tablet, Take 112 mcg by mouth at bedtime. , Disp: , Rfl: 4 .  losartan-hydrochlorothiazide (HYZAAR) 100-12.5 MG tablet, TAKE 1 TABLET BY MOUTH EVERY DAY, Disp: 90 tablet, Rfl: 2 .  Multiple Vitamin (MULTIVITAMIN WITH MINERALS) TABS tablet, Take 1 tablet by mouth daily. Centrum 50+ , Disp: , Rfl:  .  Multiple Vitamins-Minerals (MULTIVITAMIN WITH MINERALS) tablet, Take by mouth., Disp: , Rfl:  .  nitroGLYCERIN (NITROSTAT) 0.4 MG SL tablet, Place 1 tablet (0.4 mg total) under the tongue every 5 (five) minutes as needed for chest pain., Disp: 25 tablet, Rfl: 2 .  oxyCODONE (OXY IR/ROXICODONE) 5 MG immediate release tablet, Take 1 tablet (5 mg total) by mouth every 3 (three) hours as needed for moderate pain ((score 4 to 6))., Disp: 30 tablet, Rfl:  0 .  pravastatin (PRAVACHOL) 80 MG tablet, Take 80 mg by mouth at bedtime., Disp: , Rfl:  .  Specialty Vitamins Products (MAGNESIUM, AMINO ACID CHELATE,) 133 MG tablet, Take by mouth., Disp: , Rfl:  .  vitamin C (ASCORBIC ACID) 500 MG tablet, Take 500 mg by mouth daily. , Disp: , Rfl:   Social History   Tobacco Use  Smoking Status Never Smoker  Smokeless Tobacco Never Used    Allergies  Allergen Reactions  . Atorvastatin Anaphylaxis    Patient does not remember; caused pneumonia- took her off of it per patient.   . Penicillins Anaphylaxis and Hives    PATIENT HAS HAD A PCN REACTION WITH IMMEDIATE RASH, FACIAL/TONGUE/THROAT SWELLING, SOB, OR LIGHTHEADEDNESS WITH HYPOTENSION:  #  #  #  YES  #  #  #   HAS PT DEVELOPED SEVERE RASH  INVOLVING MUCUS MEMBRANES or SKIN NECROSIS: #  #  #  YES  #  #  #   PATIENT HAS HAD A PCN REACTION THAT REQUIRED HOSPITALIZATION:  #  #  #  YES  #  #  #  Has patient had a PCN reaction occurring within the last 10 years:#  #  #  YES  #  #  #    . Shellfish Allergy Anaphylaxis and Nausea And Vomiting    Severe nausea and vomiting  . Aminophylline Itching, Swelling and Rash    Pt experienced burning urination, itching, redness/rash, and swelling of genitals after given this medication via IV push.  . Iodinated Diagnostic Agents Swelling and Other (See Comments)    SWELLING REACTION UNSPECIFIED  fFLUSHING  . Latex Other (See Comments)    Causes blisters  . Oxybutynin Chloride Other (See Comments)    "blisters" "blisters"  . Vancomycin Hives    06/02/2018- Hives arm, back and chest  . Adhesive [Tape] Rash    Paper tape is ok  . Betadine [Povidone Iodine] Rash  . Ciprofloxacin Hives  . Codeine Nausea And Vomiting    .  Marland Kitchen Gelnique [Oxybutynin] Other (See Comments)    Causes blisters  . Ranolazine Other (See Comments)    Constipation  .   Objective:  There were no vitals filed for this visit. There is no height or weight on file to calculate BMI. Constitutional Well developed. Well nourished.  Vascular Dorsalis pedis pulses palpable bilaterally. Posterior tibial pulses palpable bilaterally. Capillary refill normal to all digits.  No cyanosis or clubbing noted. Pedal hair growth normal.  Neurologic Normal speech. Oriented to person, place, and time. Epicritic sensation to light touch grossly present bilaterally.  Dermatologic Nails well groomed and normal in appearance. No open wounds. No skin lesions.  Orthopedic: POP R Ankle peroneal tendons POP R lateral ankle Local edema R lateral ankle No pain medial ankle   Radiographs: None today Assessment:   1. Peroneal tendinitis of right lower leg   2. Localized edema   3. Gait disturbance    Plan:  Patient was evaluated  and treated and all questions answered.  R Peroneal Sprain Sequela, Likely R Ankle Sprain, unlikely re-tear -Transition out of boot into a Tri-Lock ankle brace and sneaker. -Continue resting icing as needed  Return in about 4 weeks (around 12/04/2018) for Peroneal tendonitis f/u, nail fungus check.

## 2018-11-17 ENCOUNTER — Telehealth: Payer: Self-pay | Admitting: Cardiovascular Disease

## 2018-11-17 NOTE — Telephone Encounter (Signed)
Keep on twice daily furosemide until weight is 214 lb or less. Call us back Thursday if weight has not reached that goal. [facility]

## 2018-11-17 NOTE — Telephone Encounter (Signed)
Spoke to patient . She is aware of instruction from dr c. She verbalized understading to contact office on Thursday if weight has not reached 214 or less.

## 2018-11-17 NOTE — Telephone Encounter (Signed)
New Message  Pt c/o swelling: STAT is pt has developed SOB within 24 hours  1) How much weight have you gained and in what time span? 3lbs  3 days  2) If swelling, where is the swelling located? Waist line up   3) Are you currently taking a fluid pill? yes  4) Are you currently SOB? No   5) Do you have a log of your daily weights (if so, list)? 2/29 214, 3/2 217.5  6) Have you gained 3 pounds in a day or 5 pounds in a week? no   7) Have you traveled recently? No

## 2018-11-17 NOTE — Telephone Encounter (Signed)
Patient states she "full of water". Patient states she has not taken furosemide for the day.   Per protocol,  RN informed patient to take  Dose of furosemide now and 1 pm take another dose. Contact office tomorrow if weight is not down.  patient aware will informed Dr Sallyanne Kuster for any further instructions will call if any additional  Instructions.

## 2018-11-19 DIAGNOSIS — N3946 Mixed incontinence: Secondary | ICD-10-CM | POA: Diagnosis not present

## 2018-11-19 DIAGNOSIS — Z87440 Personal history of urinary (tract) infections: Secondary | ICD-10-CM | POA: Diagnosis not present

## 2018-11-19 DIAGNOSIS — N390 Urinary tract infection, site not specified: Secondary | ICD-10-CM | POA: Diagnosis not present

## 2018-11-19 DIAGNOSIS — N393 Stress incontinence (female) (male): Secondary | ICD-10-CM | POA: Diagnosis not present

## 2018-11-20 NOTE — Telephone Encounter (Signed)
I would stay on the current (higher) dose of furosemide for now. If no better through the weekend, bring in for appt next week ( I will not be in the office for 2 weeks). MCr

## 2018-11-20 NOTE — Telephone Encounter (Signed)
Pt called to report that she has only lost 2.5lbs and is only down to 215.. she is feeling bad the last few days.. Dr. Ashby Dawes her PCP has been seeing her since last month and again last week for a "cold".. drainage, sore throat, no fever, but body aches... she is not getting up and moving around at all... she is always in bed trying to fight this... she denies chest pain, productive cough, sob... no edema.   I advised her to keep in touch with Dr. Ashby Dawes and I will forward her message to Dr. Sallyanne Kuster re: her weight.

## 2018-11-20 NOTE — Telephone Encounter (Signed)
Follow up    Patient is calling because she has only lost 2.5lbs since Monday. Patient weighs 215. She states that the cramps in her leg are severe.  Please call.

## 2018-11-20 NOTE — Telephone Encounter (Signed)
Pt verbalized understanding of Dr. Victorino December advice and recommendation.

## 2018-11-24 ENCOUNTER — Telehealth: Payer: Self-pay

## 2018-11-24 ENCOUNTER — Telehealth: Payer: Self-pay | Admitting: Cardiovascular Disease

## 2018-11-24 DIAGNOSIS — I5032 Chronic diastolic (congestive) heart failure: Secondary | ICD-10-CM

## 2018-11-24 DIAGNOSIS — I10 Essential (primary) hypertension: Secondary | ICD-10-CM

## 2018-11-24 NOTE — Telephone Encounter (Signed)
Pt called this morning she stated that she felt dehydrated because she can pull the skin up on the back of her hand, her weight is down to 212.5 and that her legs are itching pt is informed to not scratch her legs and make sores to keep lotion on them, pt also advised to cut  Her lasix back to 80mg  once a day from the twice a day as noted to do in prior phone notes, pt verbalized understanding, pt informed to call back if she needed [place] or had any other questions, message sent to Dr. Sallyanne Kuster

## 2018-11-24 NOTE — Telephone Encounter (Signed)
  Patient was asked by Dr Sallyanne Kuster to call and give [place] her weight since taking 80 mg 2 x daily of her fluid pill.  Her weight this morning was 212.5. Patient feels like she may be a little dehydrated.

## 2018-11-26 NOTE — Telephone Encounter (Signed)
Ok, so according to our last several messages 217 is "too wet" (on 80 mg daily of furosemide)  and 212 is "too dry" (on 80 mg twice daily). Let's try this: - continue daily weights - take furosemide 80 mg ONCE  daily and KDur 40 mEq ONCE daily if weight is 215 lb or less - take furosemide 80 mg TWICE daily and KDUr 40 mEq TWICE daily if weight is OVER 215 lb. After we try this for about 3 weeks, please check BMET and Mg. MCr

## 2018-12-01 NOTE — Telephone Encounter (Signed)
Spoke to patient Dr.Croitoru's recommendation given.Stated she has been following those instructions for the past 1&1/2 months.Advised she will need a bmet and magnesium this week.Lab orders placed in lab box.

## 2018-12-02 LAB — BASIC METABOLIC PANEL
BUN/Creatinine Ratio: 18 (ref 12–28)
BUN: 19 mg/dL (ref 8–27)
CO2: 28 mmol/L (ref 20–29)
Calcium: 9.4 mg/dL (ref 8.7–10.3)
Chloride: 92 mmol/L — ABNORMAL LOW (ref 96–106)
Creatinine, Ser: 1.06 mg/dL — ABNORMAL HIGH (ref 0.57–1.00)
GFR calc Af Amer: 56 mL/min/{1.73_m2} — ABNORMAL LOW (ref >59)
GFR calc non Af Amer: 49 mL/min/{1.73_m2} — ABNORMAL LOW (ref >59)
Glucose: 157 mg/dL — ABNORMAL HIGH (ref 65–99)
Potassium: 3.7 mmol/L (ref 3.5–5.2)
Sodium: 138 mmol/L (ref 134–144)

## 2018-12-02 LAB — MAGNESIUM: Magnesium: 1.6 mg/dL (ref 1.6–2.3)

## 2018-12-04 ENCOUNTER — Telehealth: Payer: Self-pay | Admitting: Cardiovascular Disease

## 2018-12-04 NOTE — Telephone Encounter (Signed)
Returned call to pt she states that she has been unable to sleep since nov. At that time she saw [facility]. Per ov note:  (She says during the episode, she also has significant left upper arm pain and neck pain.  She mentions her neck pain is somewhat chronic and hurts every time she moves her body in a particular position.  At this time, repeat cardiac catheterization likely will not help her.  I discussed her case with Dr. Sallyanne Kuster, based on neurological exam, she does not have any focal deficit, her symptoms likely was not a stroke but more likely shift in the blood pressure.  Other than basic lab works to rule out secondary causes, I recommend her to continue to check her blood pressure for the next week and send her blood pressure to either Dr. Sallyanne Kuster or me to review.  Overall some of her symptom is very chronic, and I am not confident there are further cardiac work-up that can be done to help her.) she states that she has been unable to sleep d/t neck and shoulder pain. Informed pt that she needs to cal her PCP since she has already been evaluated for this and r/o any cardiac issues so PCP can evaluated for ortho/sleep issues. Pt states that she will call PCP and go from there.

## 2018-12-04 NOTE — Telephone Encounter (Signed)
New Message:    Pt says she have been unable to sleep since January. She said last night she took 2 Nitroglycerin and she was able to sleep for about 4 or 5 hours. She wants to know if she can continue taking Nitroglycerin to help her sleep please?

## 2018-12-11 ENCOUNTER — Other Ambulatory Visit: Payer: Self-pay

## 2018-12-11 ENCOUNTER — Ambulatory Visit (INDEPENDENT_AMBULATORY_CARE_PROVIDER_SITE_OTHER): Payer: HMO | Admitting: Podiatry

## 2018-12-11 DIAGNOSIS — R600 Localized edema: Secondary | ICD-10-CM

## 2018-12-11 DIAGNOSIS — M7671 Peroneal tendinitis, right leg: Secondary | ICD-10-CM

## 2018-12-11 NOTE — Progress Notes (Signed)
Subjective:  Patient ID: Jill Preston, female    DOB: 11-Nov-1932,  MRN: [id number]  Chief Complaint  Patient presents with   Foot Pain    Pt states still having lots of pain and swelling, particularly in the medial and lateral sides of her ankle. Pt states brace helps but is fitting oddly due to swelling.    85 y.o. female presents with the above complaint.  Still having pain to her ankle states that she cannot wear the ankle brace that she previously had because it no longer fits.  Review of Systems: Negative except as noted in the HPI. Denies N/V/F/Ch.  Past Medical History:  Diagnosis Date   Arthritis    right knee   Cancer ([place]) yrs ago   skin cancer removed from face   Chronic diastolic CHF (congestive heart failure) (HCC)    Chronic venous insufficiency    LEA VENOUS, 10/17/2011 - mild reflux in bilateral common femoral veins   CKD (chronic kidney disease), stage III ([place])    Coronary artery disease    a. s/p PCI/BMS to prox LAD and balloon angioplasty to mLAD with suboptimal result in 2000. b. Abnl nuc 2012, cath 09/2011 - showed that the overall territory of potential ischemia was small and attributable to a moderately diseased small second diagonal artery. Med rx.   Dyspnea    with activity   Headache    History of blood transfusion 2017   Hyperlipidemia    Hypertension    Hypertensive heart disease    Hypothyroidism    Obesity    Pneumonia    several times   Stroke [facility])    pt. states she had "light stroke" in sept. 1980   Urinary incontinence     Current Outpatient Medications:    amLODipine (NORVASC) 2.5 MG tablet, Take 1 tablet (2.5 mg total) by mouth daily., Disp: 180 tablet, Rfl: 3   aspirin EC 81 MG tablet, Take 1 tablet (81 mg total) by mouth daily., Disp: 90 tablet, Rfl: 3   azithromycin (ZITHROMAX) 250 MG tablet, , Disp: , Rfl:    b complex vitamins tablet, Take by mouth., Disp: , Rfl:    bisacodyl (DULCOLAX) 5 MG EC tablet,  Take 5 mg by mouth 2 (two) times daily., Disp: , Rfl:    clobetasol ointment (TEMOVATE) 0.05 %, Please use a pea sized amount daily, Disp: , Rfl:    Coenzyme Q10 (COQ10 PO), Take 2 capsules by mouth daily., Disp: , Rfl:    ezetimibe (ZETIA) 10 MG tablet, TAKE 1 TABLET BY MOUTH EVERY DAY (Patient taking differently: Take 10 mg by mouth daily. ), Disp: 90 tablet, Rfl: 1   furosemide (LASIX) 80 MG tablet, Take 1 tablet (80 mg total) by mouth daily. Take daily if weight is 209-214lbs. HOLD if weight is under 208lbs. Call office if weight greater than 215lbs., Disp: 90 tablet, Rfl: 2   isosorbide mononitrate (IMDUR) 30 MG 24 hr tablet, TAKE 2 TABLETS TWICE A DAY, Disp: 360 tablet, Rfl: 2   KLOR-CON M20 20 MEQ tablet, Take 40 mEq by mouth daily., Disp: , Rfl: 3   levothyroxine (SYNTHROID, LEVOTHROID) 112 MCG tablet, Take 112 mcg by mouth at bedtime. , Disp: , Rfl: 4   losartan-hydrochlorothiazide (HYZAAR) 100-12.5 MG tablet, TAKE 1 TABLET BY MOUTH EVERY DAY, Disp: 90 tablet, Rfl: 2   mirabegron ER (MYRBETRIQ) 50 MG TB24 tablet, Take by mouth., Disp: , Rfl:    Multiple Vitamin (MULTIVITAMIN WITH MINERALS) TABS tablet, Take  1 tablet by mouth daily. Centrum 50+ , Disp: , Rfl:    nitrofurantoin, macrocrystal-monohydrate, (MACROBID) 100 MG capsule, , Disp: , Rfl:    nitroGLYCERIN (NITROSTAT) 0.4 MG SL tablet, Place 1 tablet (0.4 mg total) under the tongue every 5 (five) minutes as needed for chest pain., Disp: 25 tablet, Rfl: 2   nystatin (MYCOSTATIN/NYSTOP) powder, , Disp: , Rfl:    oxyCODONE (OXY IR/ROXICODONE) 5 MG immediate release tablet, Take 1 tablet (5 mg total) by mouth every 3 (three) hours as needed for moderate pain ((score 4 to 6))., Disp: 30 tablet, Rfl: 0   pravastatin (PRAVACHOL) 80 MG tablet, Take 80 mg by mouth at bedtime., Disp: , Rfl:    predniSONE (DELTASONE) 10 MG tablet, , Disp: , Rfl:    Specialty Vitamins Products (MAGNESIUM, AMINO ACID CHELATE,) 133 MG tablet, Take by  mouth., Disp: , Rfl:    sulfamethoxazole-trimethoprim (BACTRIM DS,SEPTRA DS) 800-160 MG tablet, sulfamethoxazole 800 mg-trimethoprim 160 mg tablet, Disp: , Rfl:    traMADol (ULTRAM) 50 MG tablet, Take 50-100 mg by mouth every 6 (six) hours as needed., Disp: , Rfl: 1   traZODone (DESYREL) 100 MG tablet, Take by mouth., Disp: , Rfl:    Vaginal Lubricant (REPLENS) GEL, USE AS DIRECTED ONCE DAILY VAGINAL 14 DAYS, Disp: , Rfl:    vitamin C (ASCORBIC ACID) 500 MG tablet, Take 500 mg by mouth daily. , Disp: , Rfl:   Social History   Tobacco Use  Smoking Status Never Smoker  Smokeless Tobacco Never Used    Allergies  Allergen Reactions   Atorvastatin Anaphylaxis    Patient does not remember; caused pneumonia- took her off of it per patient.    Penicillins Anaphylaxis and Hives    PATIENT HAS HAD A PCN REACTION WITH IMMEDIATE RASH, FACIAL/TONGUE/THROAT SWELLING, SOB, OR LIGHTHEADEDNESS WITH HYPOTENSION:  #  #  #  YES  #  #  #   HAS PT DEVELOPED SEVERE RASH INVOLVING MUCUS MEMBRANES or SKIN NECROSIS: #  #  #  YES  #  #  #   PATIENT HAS HAD A PCN REACTION THAT REQUIRED HOSPITALIZATION:  #  #  #  YES  #  #  #  Has patient had a PCN reaction occurring within the last 10 years:#  #  #  YES  #  #  #     Shellfish Allergy Anaphylaxis and Nausea And Vomiting    Severe nausea and vomiting   Aminophylline Itching, Swelling and Rash    Pt experienced burning urination, itching, redness/rash, and swelling of genitals after given this medication via IV push.   Iodinated Diagnostic Agents Swelling and Other (See Comments)    SWELLING REACTION UNSPECIFIED  fFLUSHING   Latex Other (See Comments)    Causes blisters   Oxybutynin Chloride Other (See Comments)    "blisters" "blisters"   Vancomycin Hives    06/02/2018- Hives arm, back and chest   Adhesive [Tape] Rash    Paper tape is ok   Betadine [Povidone Iodine] Rash   Ciprofloxacin Hives   Codeine Nausea And Vomiting    .    Gelnique [Oxybutynin] Other (See Comments)    Causes blisters   Ranolazine Other (See Comments)    Constipation  .   Objective:  There were no vitals filed for this visit. There is no height or weight on file to calculate BMI. Constitutional Well developed. Well nourished.  Vascular Dorsalis pedis pulses palpable bilaterally. Posterior tibial pulses palpable  bilaterally. Capillary refill normal to all digits.  No cyanosis or clubbing noted. Pedal hair growth normal.  Neurologic Normal speech. Oriented to person, place, and time. Epicritic sensation to light touch grossly present bilaterally.  Dermatologic Nails well groomed and normal in appearance. No open wounds. No skin lesions.  Orthopedic: POP R Ankle peroneal tendons POP R lateral ankle Local edema R lateral ankle No pain medial ankle   Radiographs: None today Assessment:   1. Peroneal tendinitis of right lower leg   2. Localized edema    Plan:  Patient was evaluated and treated and all questions answered.  R Peroneal Sprain Sequela, Likely R Ankle Sprain, possible re-tear -Injection delivered to the peroneal sheath -Unna boot applied for reduction of swelling and inflammation -Trilock brace dispensed. Patient's previous brace no longer fits, is unable to be used.  -Will consider MRI in 6 weeks should patient's pain persist.  I think 0 11 discussed with patient given the current alignment of the coronavirus pandemic it would be best to avoid this time.  Patient agreed.  Procedure: Injection Tendon/Ligament Consent: Verbal consent obtained. Location: right peroneal tendon sheath Skin Prep: Alcohol. Injectate: 1 cc 0.5% marcaine plain, 1 cc dexamethasone phosphate, 0.5 cc kenalog 10. Disposition: Patient tolerated procedure well. Injection site dressed with a band-aid.  Return in about 6 weeks (around 01/22/2019) for Peroneal tendonitis .

## 2018-12-12 DIAGNOSIS — H61002 Unspecified perichondritis of left external ear: Secondary | ICD-10-CM | POA: Diagnosis not present

## 2018-12-12 DIAGNOSIS — L820 Inflamed seborrheic keratosis: Secondary | ICD-10-CM | POA: Diagnosis not present

## 2018-12-19 ENCOUNTER — Telehealth: Payer: Self-pay | Admitting: Cardiovascular Disease

## 2018-12-19 ENCOUNTER — Other Ambulatory Visit: Payer: Self-pay

## 2018-12-19 MED ORDER — METOLAZONE 2.5 MG PO TABS: ORAL_TABLET | ORAL | 0 refills | Status: DC

## 2018-12-19 MED ORDER — KLOR-CON M20 20 MEQ PO TBCR: 40.0000 meq | EXTENDED_RELEASE_TABLET | Freq: Every day | ORAL | 3 refills | Status: DC

## 2018-12-19 MED ORDER — FUROSEMIDE 80 MG PO TABS: ORAL_TABLET | ORAL | 3 refills | Status: DC

## 2018-12-19 NOTE — Telephone Encounter (Signed)
Spoke to patient Dr.Croitoru's recommendation given.Advised patient to take metolazone 2.5 mg tomorrow 30 min before am dose of lasix only take one time.Keep other 9 tablets to have on hand and only take when directed by Dr.Croitoru.Advised to keep taking lasix and potassium as prescribed.Refills sent to pharmacy.Advised to call back Mon 4/6 to report her weight and how she is feeling.

## 2018-12-19 NOTE — Telephone Encounter (Signed)
New message  \ Pt c/o swelling: STAT is pt has developed SOB within 24 hours  1) How much weight have you gained and in what time span? 6 lb within 2 weeks  2) If swelling, where is the swelling located? Both legs  3) Are you currently taking a fluid pill?yes   4) Are you currently SOB?if the patient moves around she is sob  5) Do you have a log of your daily weights (if so, list)? Yes, can provide if needed   6) Have you gained 3 pounds in a day or 5 pounds in a week?yes   7) Have you traveled recently? No

## 2018-12-19 NOTE — Telephone Encounter (Signed)
I would recommend a one time dose of metolazone 2.5 mg.  We can Rx 10 tabs, no RF, to be taken only one at a time when indicated. That way she will have some available at home for furture episodes and she does not need to leave the house to refill the Rx.  Please make sure she understands that she is not to take them daily for 10 days! Just once. Call back with weight and symptoms on Monday, please.

## 2018-12-19 NOTE — Telephone Encounter (Signed)
Returned call to patient she stated she has gained 6 lbs within the past 2 to 3 weeks.Stated she has increased swelling in both lower legs and feet.She is sob with exertion.She takes lasix 80 mg 6am and 80 mg at 1pm.Takes Kdur 20 meq twice a day.Advised I will send message to Dr.Croitoru for advice.

## 2018-12-22 ENCOUNTER — Other Ambulatory Visit: Payer: Self-pay

## 2018-12-22 ENCOUNTER — Telehealth: Payer: Self-pay | Admitting: Cardiovascular Disease

## 2018-12-22 DIAGNOSIS — I5032 Chronic diastolic (congestive) heart failure: Secondary | ICD-10-CM

## 2018-12-22 DIAGNOSIS — I10 Essential (primary) hypertension: Secondary | ICD-10-CM

## 2018-12-22 MED ORDER — METOLAZONE 2.5 MG PO TABS: ORAL_TABLET | ORAL | 3 refills | Status: DC

## 2018-12-22 NOTE — Telephone Encounter (Signed)
Follow Up:    Please call,she said she is supposed to give you an update on her weight gain or loss.

## 2018-12-22 NOTE — Telephone Encounter (Signed)
Spoke to patient Dr.Croitoru's advice given.Stated she will have lab done today at 2:00 pm at our office.

## 2018-12-22 NOTE — Telephone Encounter (Signed)
Is there a way we could get a BMP and BNP? MCr

## 2018-12-22 NOTE — Telephone Encounter (Signed)
Spoke to patient.she stated she took Metolazone 2.5 mg 30 min before am dose of Lasix this past Sat.Stated her weight Sat 218 1/2 lbs Sun 214 1/2 lbs today 214 1/2 lbs.Stated she feels awful,very weak she tried to wash dishes and had to sit down.She is sob with exertion.Stated she has very little swelling in both lower legs and feet.She has been in bed the past 2 days.Advised I will send message to Dr.Croitoru for advice.

## 2018-12-23 ENCOUNTER — Other Ambulatory Visit: Payer: Self-pay

## 2018-12-23 ENCOUNTER — Telehealth: Payer: Self-pay | Admitting: Cardiovascular Disease

## 2018-12-23 LAB — BASIC METABOLIC PANEL
BUN/Creatinine Ratio: 27 (ref 12–28)
BUN: 28 mg/dL — ABNORMAL HIGH (ref 8–27)
CO2: 31 mmol/L — ABNORMAL HIGH (ref 20–29)
Calcium: 9.7 mg/dL (ref 8.7–10.3)
Chloride: 91 mmol/L — ABNORMAL LOW (ref 96–106)
Creatinine, Ser: 1.05 mg/dL — ABNORMAL HIGH (ref 0.57–1.00)
GFR calc Af Amer: 57 mL/min/{1.73_m2} — ABNORMAL LOW (ref >59)
GFR calc non Af Amer: 49 mL/min/{1.73_m2} — ABNORMAL LOW (ref >59)
Glucose: 112 mg/dL — ABNORMAL HIGH (ref 65–99)
Potassium: 3.4 mmol/L — ABNORMAL LOW (ref 3.5–5.2)
Sodium: 139 mmol/L (ref 134–144)

## 2018-12-23 LAB — BRAIN NATRIURETIC PEPTIDE: BNP: 60.4 pg/mL (ref 0.0–100.0)

## 2018-12-23 MED ORDER — EZETIMIBE 10 MG PO TABS: 10.0000 mg | ORAL_TABLET | Freq: Every day | ORAL | 3 refills | Status: DC

## 2018-12-23 NOTE — Telephone Encounter (Signed)
Already spoke to patient lab results given.Potassium low 3.4.Dr.Croitoru advised to increase Kdur to 60 meq daily for the next 3 days then return to normal dose 40 meq daily.Advised to call back if she continues to feel bad.

## 2018-12-23 NOTE — Telephone Encounter (Signed)
Spoke to patient .  The result of labs for 12/22/18   patient states blood pressure is lw and fluctuating  Low and lower. It started this afternoon .  Other the given blood pressure she  States   Was 107/?  Now it is  89/84 pulse 85.  She fells dizzy  Headache and no energy .    She states she  Has not used metolazone  Since Saturday .    She states she usually take medications - 6 am  Fluid pill , potassium -after  Breakfast  The rest of them but she could not ell the names - 1 pm fluid pill potassium - bedtime  Pills again could not tell the name of medications   Patient states she fells dizzy , instructed to sit down prop  Legs up ; will defr to dr C and contact her back.

## 2018-12-23 NOTE — Telephone Encounter (Signed)
Pt c/o BP issue: STAT if pt c/o blurred vision, one-sided weakness or slurred speech  1. What are your last 5 BP readings? 95/54 HR 87 1 hour ago 104/58 HR 85  2. Are you having any other symptoms (ex. Dizziness, headache, blurred vision, passed out)? At first   3. What is your BP issue? Patient States BP  Going down

## 2018-12-24 ENCOUNTER — Other Ambulatory Visit: Payer: Self-pay

## 2018-12-24 NOTE — Patient Outreach (Signed)
  [facility]) Care Management Chronic Special Needs Program    12/24/2018  Name: Jill Preston, DOB: 1932-11-09  MRN: [id number]   Jill Preston is enrolled in a chronic special needs plan for Heart Failure. Client called to follow up on call to the Nurse Advise Line 12/23/18 for low B/P and headache.  States she had taken fluid pills over the weekend for her swelling. States she called Dr. Sallyanne Kuster and she had labs drawn.  States her potassium was low and he ordered her to take extra potassium. States she is feeling better today and her B/P was 128/62 and pulse 71 this morning.  States her headache is better and her swelling is also better. Reviewed s/s to call MD  Discussed COVID19 cause, symptoms, precautions (social distancing, stay at home order, hand washing), confirmed client knows how to contact provider. Plan to outreach on next scheduled call in July or sooner if needed. Peter Garter RN, Jackquline [place], CDE Chronic Care Management Coordinator [facility] Management [phone]

## 2018-12-25 ENCOUNTER — Telehealth: Payer: Self-pay | Admitting: Cardiovascular Disease

## 2018-12-25 DIAGNOSIS — I5032 Chronic diastolic (congestive) heart failure: Secondary | ICD-10-CM

## 2018-12-25 MED ORDER — METOLAZONE 2.5 MG PO TABS: ORAL_TABLET | ORAL | 3 refills | Status: DC

## 2018-12-25 MED ORDER — EZETIMIBE 10 MG PO TABS: 10.0000 mg | ORAL_TABLET | Freq: Every day | ORAL | 3 refills | Status: DC

## 2018-12-25 NOTE — Telephone Encounter (Signed)
Let's try metolazone 2.5 mg again today and Monday, stay on the 60 mEq of KCL to avoid the K dropping again

## 2018-12-25 NOTE — Telephone Encounter (Signed)
Spoke with pt who is calling to report weight gain and generalized edema. Last OV with Dr. Sallyanne Kuster 08/01/2018. Pt weight at that time was 207lbs. Notes included that pt was to call if weight increases over 215lbs. She is calling to report weight at 214lbs on 4/7 and 217lbs on 4/9. She is concerned about the weight increase. She c/o 'swelling all over body' and states she is SOB but that she has generally been SOB. Reviewed cardiac meds with pt; specifically Lasix, Hyzaar, Zaroxolyn, amlodipine, potassium, and Imdur. Pt was advised on 4/7 to take potassium 60 mEq daily for 3 days then resume taking 40MeQ daily. Per tele note on 4/3, it appears that pt was to take metolazone 2.5 mg once and keep the rest of tabs on hand. Pt states she has only taken metolazone once. Routed to Dr. Sallyanne Kuster for review. Refill of  ezetimibe sent to pt preferred pharm.   Pt was also c/o of K+ causing diarrhea. Consulted pharmD [place], [place] who advised that pt try taking K+ with food.

## 2018-12-25 NOTE — Telephone Encounter (Signed)
Pt c/o swelling: STAT is pt has developed SOB within 24 hours  1) How much weight have you gained and in what time span? 3 pounds  2) If swelling, where is the swelling located? All over patient body  3) Are you currently taking a fluid pill? Yes  4) Are you currently SOB? Yes   5) Do you have a log of your daily weights (if so, list)? Yes   6) Have you gained 3 pounds in a day or 5 pounds in a week? yes  7) Have you traveled recently? no

## 2018-12-25 NOTE — Telephone Encounter (Signed)
Spoke with Jill Preston and advised that per Dr. Sallyanne Kuster she is to take metolazone 2.5 mg again today and take another one Monday April 13. Advised her to take the Monday metolazone 30 min before her morning lasix since that was included in med instructions and 4/3 telephone note. Also advised Jill Preston to stay on the 60 mEq of KCL to avoid her K+ dropping again. Jill Preston verbalized understanding.  Advised Jill Preston that per pharmD she may take her K+ with food to see if that helps with diarrhea. Jill Preston verbalized understanding.

## 2018-12-31 DIAGNOSIS — M545 Low back pain: Secondary | ICD-10-CM | POA: Diagnosis not present

## 2018-12-31 DIAGNOSIS — M435X6 Other recurrent vertebral dislocation, lumbar region: Secondary | ICD-10-CM | POA: Diagnosis not present

## 2019-01-02 DIAGNOSIS — J40 Bronchitis, not specified as acute or chronic: Secondary | ICD-10-CM | POA: Diagnosis not present

## 2019-01-02 DIAGNOSIS — R05 Cough: Secondary | ICD-10-CM | POA: Diagnosis not present

## 2019-01-07 ENCOUNTER — Telehealth: Payer: Self-pay | Admitting: Cardiovascular Disease

## 2019-01-07 LAB — BASIC METABOLIC PANEL
BUN/Creatinine Ratio: 30 — ABNORMAL HIGH (ref 12–28)
BUN: 30 mg/dL — ABNORMAL HIGH (ref 8–27)
CO2: 28 mmol/L (ref 20–29)
Calcium: 9.6 mg/dL (ref 8.7–10.3)
Chloride: 97 mmol/L (ref 96–106)
Creatinine, Ser: 0.99 mg/dL (ref 0.57–1.00)
GFR calc Af Amer: 61 mL/min/{1.73_m2} (ref >59)
GFR calc non Af Amer: 53 mL/min/{1.73_m2} — ABNORMAL LOW (ref >59)
Glucose: 152 mg/dL — ABNORMAL HIGH (ref 65–99)
Potassium: 3.7 mmol/L (ref 3.5–5.2)
Sodium: 142 mmol/L (ref 134–144)

## 2019-01-07 NOTE — Telephone Encounter (Signed)
spoke with patient - information given per Dr C order.- take furosemide for next 3 days twice a day . Patient voiced understanding- states that what she informed the nurse earlier .

## 2019-01-07 NOTE — Telephone Encounter (Signed)
Spoke with pt who states she has had a 3 lb weight gain overnight. She reports that she has been weighing 217 lbs for a week. When she woke this morning she was tired and current weight is 220 lbs. Pt reports that she has been taking lasix daily but plans to take it again at 1pm today. She c/o DOE while waking and making her bed.

## 2019-01-07 NOTE — Telephone Encounter (Signed)
I think that's what we need to do: take furosemide twice daily (first thing AM and then again early afternoon) for next 3 days. MCr

## 2019-01-07 NOTE — Telephone Encounter (Signed)
New Message    Pt c/o swelling: STAT is pt has developed SOB within 24 hours  1) How much weight have you gained and in what time span? 217lbs for about a week and today it is 220  2) If swelling, where is the swelling located? All over   3) Are you currently taking a fluid pill? Yes, 1 this morning at about 7am and around 1 she will take another one   4) Are you currently SOB? No,She feels tired   5) Do you have a log of your daily weights (if so, list)?Yes   6) Have you gained 3 pounds in a day or 5 pounds in a week? Yes    Have you traveled recently? No

## 2019-01-08 DIAGNOSIS — R05 Cough: Secondary | ICD-10-CM | POA: Diagnosis not present

## 2019-01-08 DIAGNOSIS — M545 Low back pain: Secondary | ICD-10-CM | POA: Diagnosis not present

## 2019-01-08 DIAGNOSIS — M48061 Spinal stenosis, lumbar region without neurogenic claudication: Secondary | ICD-10-CM | POA: Diagnosis not present

## 2019-01-08 DIAGNOSIS — J40 Bronchitis, not specified as acute or chronic: Secondary | ICD-10-CM | POA: Diagnosis not present

## 2019-01-13 ENCOUNTER — Ambulatory Visit: Payer: Self-pay | Admitting: Pharmacist

## 2019-01-20 DIAGNOSIS — M5441 Lumbago with sciatica, right side: Secondary | ICD-10-CM | POA: Diagnosis not present

## 2019-01-20 DIAGNOSIS — G8929 Other chronic pain: Secondary | ICD-10-CM | POA: Diagnosis not present

## 2019-01-21 ENCOUNTER — Telehealth: Payer: Self-pay | Admitting: Cardiovascular Disease

## 2019-01-21 NOTE — Telephone Encounter (Signed)
Thank you.  Since there was a difference in heart rate as well as blood pressure on that first check, it was probably an error.  Her current blood pressure and heart rate are perfectly acceptable.  Suspect the pain in her arm is musculoskeletal, since it occurs randomly and gets worse with using the arm.  I do not think it is a sign of coronary blockages.  I am pleased to hear that she is no longer short of breath.  No change in medications. MCr

## 2019-01-21 NOTE — Telephone Encounter (Signed)
Call placed to the patient. She stated that this morning her blood pressure in her left arm was 144/86 and heart rate was 80. In her right arm it was 112/65 and heart rate was 59. She called because she was concerned about the difference in pressures and she stated that her left arm was hurting worse this morning and wondered if that had anything to do with the increased pressure.   She was asked to recheck her pressure while I was on the phone with her. The left arm was 139/80 and heart rate was 73. Right arm was 130/70 and heart rate was 70.  She is getting concerned about the increased pain in her left arm and shoulder. She stated that the pain comes on suddenly and can wake her up at night. She does find that the more she uses the arm the worse it becomes. She is concerned that the blockages she has could be getting worse.  She also stated that on Monday her weight was 221. Tuesday it was 219 and then this morning it was 217. She is still taking Furosemide 80 mg bid. She does not have any new complaints of shortness of breath, just fatigue. She was skipping meals to see if she would lose weight that way and has been advised to not skip meals that the weight was most likely fluid.

## 2019-01-21 NOTE — Telephone Encounter (Signed)
°  Patient has questions about BP  Right arm 144/86 HR 80 Left arm 112/65 HR 59   Pt c/o swelling: STAT is pt has developed SOB within 24 hours  1) How much weight have you gained and in what time span? no  2) If swelling, where is the swelling located? Legs, stomach  3) Are you currently taking a fluid pill? YES  4) Are you currently SOB? NO  5) Do you have a log of your daily weights (if so, list)? 217 today, 219 yesterday, 221  6) Have you gained 3 pounds in a day or 5 pounds in a week? no  7) Have you traveled recently? NO

## 2019-01-21 NOTE — Telephone Encounter (Signed)
Patient made aware and will call back if anything changes or if anything else is needed.

## 2019-01-22 ENCOUNTER — Encounter: Payer: Self-pay | Admitting: Podiatry

## 2019-01-22 ENCOUNTER — Other Ambulatory Visit: Payer: Self-pay

## 2019-01-22 ENCOUNTER — Ambulatory Visit: Payer: HMO | Admitting: Podiatry

## 2019-01-22 VITALS — Temp 97.5°F

## 2019-01-22 DIAGNOSIS — R269 Unspecified abnormalities of gait and mobility: Secondary | ICD-10-CM

## 2019-01-22 DIAGNOSIS — M7671 Peroneal tendinitis, right leg: Secondary | ICD-10-CM

## 2019-01-22 DIAGNOSIS — R600 Localized edema: Secondary | ICD-10-CM

## 2019-01-26 ENCOUNTER — Other Ambulatory Visit: Payer: Self-pay | Admitting: Neurosurgery

## 2019-01-26 DIAGNOSIS — G8929 Other chronic pain: Secondary | ICD-10-CM

## 2019-01-30 ENCOUNTER — Ambulatory Visit: Payer: Self-pay | Admitting: Pharmacist

## 2019-02-03 ENCOUNTER — Telehealth: Payer: Self-pay | Admitting: Cardiovascular Disease

## 2019-02-03 ENCOUNTER — Other Ambulatory Visit: Payer: Self-pay

## 2019-02-03 MED ORDER — METOLAZONE 2.5 MG PO TABS: 2.5000 mg | ORAL_TABLET | ORAL | 6 refills | Status: DC | PRN

## 2019-02-03 NOTE — Telephone Encounter (Signed)
Received a call from patient she stated when she turned over in bed this morning she became very dizzy felt like the room was spinning around.Stated she feels nauseated.Stated she is still laying in bed very dizzy when she moves. No chest pain.B/P 153/77.Advised to call PCP.

## 2019-02-03 NOTE — Telephone Encounter (Signed)
Spoke to patient Dr.Croitoru's advice given.She stated she called PCP and he sent medication to pharmacy.Stated husband is on the way to drug store to pick up.Advised I hope she feels better soon.

## 2019-02-03 NOTE — Telephone Encounter (Signed)
If she has dizziness when she turns her head and nausea it is almost certainly benign positional vertigo. Please refer to PCP/ENT. If she cannot reach them OK to try meclizine 25 mg PO every 8h as needed for vertigo.

## 2019-02-05 ENCOUNTER — Telehealth: Payer: Self-pay

## 2019-02-05 NOTE — Telephone Encounter (Signed)
Phone call to patient to verify medication list and allergies for myelogram procedure. Pt instructed to hold Ultram and Trazodone for 48hrs prior to myelogram appointment time. Pt also has allergy to contrast dye, an RN will call in a 13 hr prep for pt once she is scheduled.  Pt verbalized understanding.

## 2019-02-06 ENCOUNTER — Other Ambulatory Visit: Payer: Self-pay | Admitting: Pharmacist

## 2019-02-06 ENCOUNTER — Ambulatory Visit: Payer: Self-pay | Admitting: Pharmacist

## 2019-02-06 NOTE — Patient Outreach (Signed)
[facility]) Care Management [place]  02/06/2019  Jill Preston [birth year] [id number]  Reason for call: HTA C-SNP quarterly outreach  Successful telephonic outreach to Jill Preston with HIPAA identifiers verified x2.  Purpose of call today is to check in with Jill Preston and her chronic health conditions.  Patient states she is not feeling well today and has been battling vertigo since 5.19.20.  She reports SX of dizziness, nausea, etc.  She reports that she called her cardiologist, Dr. Aundria Mems on 5.19.20 and he suggested that it may be BPH.  He recommended meclizine 25mg  as needed.  Patient states she has taken it and gets relief, but then it comes back.  Encouraged her to reach out to MD as directed if SXs persist/worsen.  Patient would like for me to call back next week to discuss her medications & chronic conditions.  PLAN: -I will reach out to patient next week for routine HTA C-SNP outreach -I will pass along information to Dr. Loletha Grayer regarding patient's SX  Regina Eck, PharmD, [place]  [phone]

## 2019-02-10 DIAGNOSIS — H8110 Benign paroxysmal vertigo, unspecified ear: Secondary | ICD-10-CM | POA: Diagnosis not present

## 2019-02-10 DIAGNOSIS — Z7189 Other specified counseling: Secondary | ICD-10-CM | POA: Diagnosis not present

## 2019-02-13 ENCOUNTER — Ambulatory Visit: Payer: Self-pay | Admitting: Pharmacist

## 2019-02-15 NOTE — Progress Notes (Signed)
Subjective:  Patient ID: Jill Preston, female    DOB: [birth year],  MRN: [id number]  Chief Complaint  Patient presents with  . Foot Problem    f/u on peroneal tendonitis, pt states that she is feeling better, but is still concerned about the feet swelling, pt is also not happy with the injection she recieved last time    85 y.o. female presents with the above complaint.  History above confirmed patient Review of Systems: Negative except as noted in the HPI. Denies N/V/F/Ch.  Past Medical History:  Diagnosis Date  . Arthritis    right knee  . Cancer [facility]) yrs ago   skin cancer removed from face  . Chronic diastolic CHF (congestive heart failure) ([place])   . Chronic venous insufficiency    LEA VENOUS, 10/17/2011 - mild reflux in bilateral common femoral veins  . CKD (chronic kidney disease), stage III ([place])   . Coronary artery disease    a. s/p PCI/BMS to prox LAD and balloon angioplasty to mLAD with suboptimal result in 2000. b. Abnl nuc 2012, cath 09/2011 - showed that the overall territory of potential ischemia was small and attributable to a moderately diseased small second diagonal artery. Med rx.  Marland Kitchen Dyspnea    with activity  . Headache   . History of blood transfusion 2017  . Hyperlipidemia   . Hypertension   . Hypertensive heart disease   . Hypothyroidism   . Obesity   . Pneumonia    several times  . Stroke [facility])    pt. states she had "light stroke" in sept. 1980  . Urinary incontinence     Current Outpatient Medications:  .  amLODipine (NORVASC) 2.5 MG tablet, Take 1 tablet (2.5 mg total) by mouth daily., Disp: 180 tablet, Rfl: 3 .  aspirin EC 81 MG tablet, Take 1 tablet (81 mg total) by mouth daily., Disp: 90 tablet, Rfl: 3 .  azithromycin (ZITHROMAX) 250 MG tablet, , Disp: , Rfl:  .  b complex vitamins tablet, Take by mouth., Disp: , Rfl:  .  bisacodyl (DULCOLAX) 5 MG EC tablet, Take 5 mg by mouth 2 (two) times daily., Disp: , Rfl:  .  clobetasol ointment  (TEMOVATE) 0.05 %, Please use a pea sized amount daily, Disp: , Rfl:  .  Coenzyme Q10 (COQ10 PO), Take 2 capsules by mouth daily., Disp: , Rfl:  .  ezetimibe (ZETIA) 10 MG tablet, Take 1 tablet (10 mg total) by mouth daily., Disp: 90 tablet, Rfl: 3 .  furosemide (LASIX) 80 MG tablet, Take 80 mg at 6 am and 80 mg at 1 pm, Disp: 180 tablet, Rfl: 3 .  isosorbide mononitrate (IMDUR) 30 MG 24 hr tablet, TAKE 2 TABLETS TWICE A DAY, Disp: 360 tablet, Rfl: 2 .  KLOR-CON M20 20 MEQ tablet, Take 2 tablets (40 mEq total) by mouth daily., Disp: 180 tablet, Rfl: 3 .  levothyroxine (SYNTHROID, LEVOTHROID) 112 MCG tablet, Take 112 mcg by mouth at bedtime. , Disp: , Rfl: 4 .  losartan-hydrochlorothiazide (HYZAAR) 100-12.5 MG tablet, TAKE 1 TABLET BY MOUTH EVERY DAY, Disp: 90 tablet, Rfl: 2 .  Multiple Vitamin (MULTIVITAMIN WITH MINERALS) TABS tablet, Take 1 tablet by mouth daily. Centrum 50+ , Disp: , Rfl:  .  nitrofurantoin, macrocrystal-monohydrate, (MACROBID) 100 MG capsule, , Disp: , Rfl:  .  nitroGLYCERIN (NITROSTAT) 0.4 MG SL tablet, Place 1 tablet (0.4 mg total) under the tongue every 5 (five) minutes as needed for chest pain., Disp: 25 tablet, Rfl:  2 .  nystatin (MYCOSTATIN/NYSTOP) powder, , Disp: , Rfl:  .  oxyCODONE (OXY IR/ROXICODONE) 5 MG immediate release tablet, Take 1 tablet (5 mg total) by mouth every 3 (three) hours as needed for moderate pain ((score 4 to 6)). (Patient not taking: Reported on 02/05/2019), Disp: 30 tablet, Rfl: 0 .  pravastatin (PRAVACHOL) 80 MG tablet, Take 80 mg by mouth at bedtime., Disp: , Rfl:  .  predniSONE (DELTASONE) 10 MG tablet, , Disp: , Rfl:  .  Specialty Vitamins Products (MAGNESIUM, AMINO ACID CHELATE,) 133 MG tablet, Take by mouth., Disp: , Rfl:  .  sulfamethoxazole-trimethoprim (BACTRIM DS,SEPTRA DS) 800-160 MG tablet, sulfamethoxazole 800 mg-trimethoprim 160 mg tablet, Disp: , Rfl:  .  traMADol (ULTRAM) 50 MG tablet, Take 50-100 mg by mouth every 6 (six) hours as  needed., Disp: , Rfl: 1 .  traZODone (DESYREL) 100 MG tablet, Take by mouth., Disp: , Rfl:  .  Vaginal Lubricant (REPLENS) GEL, USE AS DIRECTED ONCE DAILY VAGINAL 14 DAYS, Disp: , Rfl:  .  vitamin C (ASCORBIC ACID) 500 MG tablet, Take 500 mg by mouth daily. , Disp: , Rfl:  .  metolazone (ZAROXOLYN) 2.5 MG tablet, Take 1 tablet (2.5 mg total) by mouth as needed. Take 2.5 mg 30 min before AM dose of Lasix (only take when instructed to do so by Dr. Sallyanne Kuster), Disp: 30 tablet, Rfl: 6 .  mirabegron ER (MYRBETRIQ) 50 MG TB24 tablet, Take by mouth., Disp: , Rfl:   Social History   Tobacco Use  Smoking Status Never Smoker  Smokeless Tobacco Never Used    Allergies  Allergen Reactions  . Atorvastatin Anaphylaxis    Patient does not remember; caused pneumonia- took her off of it per patient.   . Penicillins Anaphylaxis and Hives    PATIENT HAS HAD A PCN REACTION WITH IMMEDIATE RASH, FACIAL/TONGUE/THROAT SWELLING, SOB, OR LIGHTHEADEDNESS WITH HYPOTENSION:  #  #  #  YES  #  #  #   HAS PT DEVELOPED SEVERE RASH INVOLVING MUCUS MEMBRANES or SKIN NECROSIS: #  #  #  YES  #  #  #   PATIENT HAS HAD A PCN REACTION THAT REQUIRED HOSPITALIZATION:  #  #  #  YES  #  #  #  Has patient had a PCN reaction occurring within the last 10 years:#  #  #  YES  #  #  #    . Shellfish Allergy Anaphylaxis and Nausea And Vomiting    Severe nausea and vomiting  . Aminophylline Itching, Swelling and Rash    Pt experienced burning urination, itching, redness/rash, and swelling of genitals after given this medication via IV push.  . Iodinated Diagnostic Agents Swelling and Other (See Comments)    SWELLING REACTION UNSPECIFIED  fFLUSHING  . Latex Other (See Comments)    Causes blisters  . Oxybutynin Chloride Other (See Comments)    "blisters" "blisters"  . Vancomycin Hives    06/02/2018- Hives arm, back and chest  . Adhesive [Tape] Rash    Paper tape is ok  . Betadine [Povidone Iodine] Rash  . Ciprofloxacin Hives  .  Codeine Nausea And Vomiting    .  Marland Kitchen Gelnique [Oxybutynin] Other (See Comments)    Causes blisters  . Ranolazine Other (See Comments)    Constipation  .   Objective:   Vitals:   01/22/19 1607  Temp: (!) 97.5 F (36.4 C)   There is no height or weight on file to calculate BMI.  Constitutional Well developed. Well nourished.  Vascular Dorsalis pedis pulses palpable bilaterally. Posterior tibial pulses palpable bilaterally. Capillary refill normal to all digits.  No cyanosis or clubbing noted. Pedal hair growth normal.  Neurologic Normal speech. Oriented to person, place, and time. Epicritic sensation to light touch grossly present bilaterally.  Dermatologic Nails well groomed and normal in appearance. No open wounds. No skin lesions.  Orthopedic: POP R Ankle peroneal tendons POP R lateral ankle Local edema R lateral ankle No pain medial ankle   Radiographs: None today Assessment:   1. Peroneal tendinitis of right lower leg   2. Localized edema   3. Gait disturbance    Plan:  Patient was evaluated and treated and all questions answered.  R Peroneal Sprain Sequela, Likely R Ankle Sprain, possible re-tear -Continue compression dressings as needed. -No further injections -Discussed that should her pain persist would consider an MRI however at this point we would only consider MRI if we were be considering fixing the tendon again.  I do not think given her age medical history and history of previous rupture if she would be a good candidate for fixing the tendon again  No follow-ups on file.

## 2019-02-17 ENCOUNTER — Ambulatory Visit: Payer: Self-pay | Admitting: Pharmacist

## 2019-02-17 ENCOUNTER — Other Ambulatory Visit: Payer: Self-pay | Admitting: Pharmacist

## 2019-02-24 NOTE — Patient Outreach (Signed)
[facility]) Care Management  [place]   02/24/2019  Jill Preston August 23, 1933 [id number]  Reason for referral: Reason for call:medication management/Routine patient outreach for HTA CSNP Patient (CHF 65-70%)  PMHx includes but not limited to:  HTN, HLD, CHF, PAD, hypothyroidsim  Outreach:  Successful telephone call with Ms. Sarff.  HIPAA identifiers verified.  Patient states she is doing well today.  She continues to take medications as prescribed.  Medications reviewed and patient has all medications that she needs in her possession.  Multiple changes to medication list per patient report.  Medications updated in the EMR.  She reports that her vertigo is still present, but PCP is following closely.   Daily weights reported to be within goal range.  Denies SOB or edema above baseline.  Patient knows to call cardiologist when weight is >215lbs (she reports ranges 214-221lbs).  Patient stated she would call.  She knows to dose extra lasix in the early evening for these weights.  She reports that she is not taking metolazone, however she may benefit from MWF administration.  Instructions for metolazone state "only as directed by MD".  Encouraged patient to ask cardiologist about this.  Labs reviewed.  Encouraged patient to reach out if additional pharmacy needs arise.    Lab Results  Component Value Date   CREATININE 0.99 01/06/2019   CREATININE 1.05 (H) 12/22/2018   CREATININE 1.06 (H) 12/02/2018    Lab Results  Component Value Date   HGBA1C 6.1 (H) 01/26/2015    Lipid Panel     Component Value Date/Time   CHOL 193 11/18/2017 0857   TRIG 212 (H) 11/18/2017 0857   HDL 47 11/18/2017 0857   CHOLHDL 4.1 11/18/2017 0857   CHOLHDL 4.2 06/27/2015 1336   VLDL 48 (H) 06/27/2015 1336   LDLCALC 104 (H) 11/18/2017 0857    Medications Reviewed Today    Reviewed by Lavera Guise, [facility] (Pharmacist) on 02/17/19 at 1412  Med List Status: <None>  Medication  Order Taking? Sig Documenting Provider Last Dose Status Informant  amLODipine (NORVASC) 2.5 MG tablet [id number]  Take 1 tablet (2.5 mg total) by mouth daily. Barrett, Felisa Bonier  Active Self  aspirin EC 81 MG tablet [id number] Yes Take 1 tablet (81 mg total) by mouth daily. Erlene Quan, PA-C Taking Active Self           Med Note Andrey Farmer Feb 05, 2019  9:24 AM)          Discontinued 02/17/19 1412 (Completed Course)   bisacodyl (DULCOLAX) 5 MG EC tablet [id number] Yes Take 5 mg by mouth 2 (two) times daily. [provider] Taking Active Self  clobetasol ointment (TEMOVATE) 0.05 % [id number] Yes Please use a pea sized amount daily [provider] Taking Active   Coenzyme Q10 (COQ10 PO) [id number] Yes Take 2 capsules by mouth daily. [provider] Taking Active Self  ezetimibe (ZETIA) 10 MG tablet [id number] Yes Take 1 tablet (10 mg total) by mouth daily. Croitoru, Mihai, MD Taking Active   furosemide (LASIX) 80 MG tablet [id number] Yes Take 80 mg at 6 am and 80 mg at 1 pm Croitoru, Mihai, MD Taking Active   isosorbide mononitrate (IMDUR) 30 MG 24 hr tablet [id number] Yes TAKE 2 TABLETS TWICE A DAY Leonie Man, MD Taking Active   KLOR-CON M20 20 MEQ tablet [id number] Yes Take 2 tablets (40 mEq total) by mouth daily. Croitoru, Dani Gobble, MD Taking Active  levothyroxine (SYNTHROID, LEVOTHROID) 112 MCG tablet [id number] Yes Take 112 mcg by mouth at bedtime.  [provider] Taking Active Self  losartan-hydrochlorothiazide (HYZAAR) 100-12.5 MG tablet [id number] Yes TAKE 1 TABLET BY MOUTH EVERY DAY Croitoru, Mihai, MD Taking Active   metolazone (ZAROXOLYN) 2.5 MG tablet [id number] Yes Take 1 tablet (2.5 mg total) by mouth as needed. Take 2.5 mg 30 min before AM dose of Lasix (only take when instructed to do so by Dr. Sallyanne Kuster) Croitoru, Dani Gobble, MD Taking Active   mirabegron ER [facility]) 50 MG TB24 tablet [id number] Yes Take by mouth. [provider]  Taking Active            Med Note Blanca Friend, Royce Macadamia   Tue Feb 17, 2019  1:59 PM) Only takes as needed   Multiple Vitamin (MULTIVITAMIN WITH MINERALS) TABS tablet [id number] Yes Take 1 tablet by mouth daily. Centrum 50+  [provider] Taking Active Self  nitroGLYCERIN (NITROSTAT) 0.4 MG SL tablet [id number] Yes Place 1 tablet (0.4 mg total) under the tongue every 5 (five) minutes as needed for chest pain. Croitoru, Mihai, MD Taking Active Self  nystatin (MYCOSTATIN/NYSTOP) powder [id number] Yes  [provider] Taking Active   oxyCODONE (OXY IR/ROXICODONE) 5 MG immediate release tablet [id number] No Take 1 tablet (5 mg total) by mouth every 3 (three) hours as needed for moderate pain ((score 4 to 6)).  Patient not taking:  Reported on 02/05/2019   Newman Pies, MD Not Taking Active   pravastatin (PRAVACHOL) 80 MG tablet [id number] Yes Take 80 mg by mouth at bedtime. [provider] Taking Active Self  predniSONE (DELTASONE) 10 MG tablet [id number] No  [provider] Not Taking Active   Specialty Vitamins Products (MAGNESIUM, AMINO ACID CHELATE,) 133 MG tablet [id number] Yes Take by mouth. [provider] Taking Active   traMADol (ULTRAM) 50 MG tablet [id number] Yes Take 50-100 mg by mouth every 6 (six) hours as needed. [provider] Taking Active   traZODone (DESYREL) 100 MG tablet [id number] Yes Take by mouth. [provider] Taking Active   Vaginal Lubricant (REPLENS) GEL [id number] Yes USE AS DIRECTED ONCE DAILY VAGINAL 14 DAYS [provider] Taking Active   vitamin C (ASCORBIC ACID) 500 MG tablet [id number] Yes Take 500 mg by mouth daily.  [provider] Taking Active Self          Plan: . I will continue quarterly outreach for HTA C-SNP patient  Regina Eck, PharmD, [place]  [phone]

## 2019-02-25 ENCOUNTER — Telehealth: Payer: Self-pay | Admitting: Nurse Practitioner

## 2019-02-25 NOTE — Progress Notes (Signed)
Phone call to patient to review instructions for 13 hr prep for a myelogram on June 17th at 0815. Prescription called into [facility] on [address]. Pt aware and verbalized understanding of instructions. Prescription: 1915- 50mg  Prednisone 0115- 50mg  Prednisone 0715- 50mg  Prednisone and 50mg  Benadryl

## 2019-03-02 ENCOUNTER — Ambulatory Visit: Payer: Self-pay | Admitting: Pharmacist

## 2019-03-04 ENCOUNTER — Ambulatory Visit
Admission: RE | Admit: 2019-03-04 | Discharge: 2019-03-04 | Disposition: A | Discharge status: Home / Self Care | Payer: HMO | Source: Ambulatory Visit | Attending: Neurosurgery | Admitting: Neurosurgery

## 2019-03-04 ENCOUNTER — Other Ambulatory Visit: Payer: Self-pay

## 2019-03-04 ENCOUNTER — Telehealth: Payer: Self-pay | Admitting: Podiatry

## 2019-03-04 DIAGNOSIS — G8929 Other chronic pain: Secondary | ICD-10-CM

## 2019-03-04 DIAGNOSIS — M5441 Lumbago with sciatica, right side: Secondary | ICD-10-CM

## 2019-03-04 DIAGNOSIS — M545 Low back pain: Secondary | ICD-10-CM | POA: Diagnosis not present

## 2019-03-04 MED ORDER — IOPAMIDOL (ISOVUE-M 200) INJECTION 41%
15.0000 mL | Freq: Once | INTRAMUSCULAR | Status: AC
  Administered 2019-03-04: 15 mL via INTRATHECAL

## 2019-03-04 MED ORDER — DIAZEPAM 5 MG PO TABS
5.0000 mg | ORAL_TABLET | Freq: Once | ORAL | Status: AC
  Administered 2019-03-04: 5 mg via ORAL

## 2019-03-04 NOTE — Telephone Encounter (Signed)
Pt called stating that she had a CT Scan done today at [place] for her back and leg and had a scan done of her foot and ankle while she was there. Pt wanted to see if Dr.Price could take a look at those images and see if there anything he sees so she will not have to have another MRI done.

## 2019-03-04 NOTE — Telephone Encounter (Signed)
I called pt and told her I would inform Dr. March Rummage of the CT results from Dr. Arnoldo Morale being available and pt states she will she if Dr. Arnoldo Morale will send Dr. March Rummage a note from her 03/10/2019 appt to discuss the CT. I told pt I would send Dr.Price a note to remind him to check with her concerning the CT and Dr. Arnoldo Morale evaluation.

## 2019-03-04 NOTE — Discharge Instructions (Signed)

## 2019-03-05 ENCOUNTER — Ambulatory Visit: Payer: HMO | Admitting: Podiatry

## 2019-03-10 DIAGNOSIS — Z6836 Body mass index (BMI) 36.0-36.9, adult: Secondary | ICD-10-CM | POA: Diagnosis not present

## 2019-03-10 DIAGNOSIS — M545 Low back pain: Secondary | ICD-10-CM | POA: Diagnosis not present

## 2019-03-12 ENCOUNTER — Other Ambulatory Visit: Payer: Self-pay

## 2019-03-12 ENCOUNTER — Ambulatory Visit: Payer: HMO | Admitting: Podiatry

## 2019-03-12 DIAGNOSIS — S86311S Strain of muscle(s) and tendon(s) of peroneal muscle group at lower leg level, right leg, sequela: Secondary | ICD-10-CM

## 2019-03-12 DIAGNOSIS — R269 Unspecified abnormalities of gait and mobility: Secondary | ICD-10-CM | POA: Diagnosis not present

## 2019-03-12 DIAGNOSIS — M7671 Peroneal tendinitis, right leg: Secondary | ICD-10-CM | POA: Diagnosis not present

## 2019-03-12 NOTE — Progress Notes (Signed)
Subjective:  Patient ID: Jill Preston, female    DOB: Feb 08, 1933,  MRN: [id number]  Chief Complaint  Patient presents with  . Ankle Pain    f/u on ankle pain in the right ankle, pt is still dealing with swelling in the right ankle, compression sock has not been helping, pain is on a scale of 5 out of 30    85 y.o. female presents with the above complaint.  History above confirmed patient. Still having pain and not much improvement. Unable to wear the boot much due to her back.   Review of Systems: Negative except as noted in the HPI. Denies N/V/F/Ch.  Past Medical History:  Diagnosis Date  . Arthritis    right knee  . Cancer [facility]) yrs ago   skin cancer removed from face  . Chronic diastolic CHF (congestive heart failure) ([place])   . Chronic venous insufficiency    LEA VENOUS, 10/17/2011 - mild reflux in bilateral common femoral veins  . CKD (chronic kidney disease), stage III ([place])   . Coronary artery disease    a. s/p PCI/BMS to prox LAD and balloon angioplasty to mLAD with suboptimal result in 2000. b. Abnl nuc 2012, cath 09/2011 - showed that the overall territory of potential ischemia was small and attributable to a moderately diseased small second diagonal artery. Med rx.  Marland Kitchen Dyspnea    with activity  . Headache   . History of blood transfusion 2017  . Hyperlipidemia   . Hypertension   . Hypertensive heart disease   . Hypothyroidism   . Obesity   . Pneumonia    several times  . Stroke [facility])    pt. states she had "light stroke" in sept. 1980  . Urinary incontinence     Current Outpatient Medications:  .  amLODipine (NORVASC) 2.5 MG tablet, Take 1 tablet (2.5 mg total) by mouth daily., Disp: 180 tablet, Rfl: 3 .  aspirin EC 81 MG tablet, Take 1 tablet (81 mg total) by mouth daily., Disp: 90 tablet, Rfl: 3 .  bisacodyl (DULCOLAX) 5 MG EC tablet, Take 5 mg by mouth 2 (two) times daily., Disp: , Rfl:  .  clobetasol ointment (TEMOVATE) 0.05 %, Please use a pea sized  amount daily, Disp: , Rfl:  .  Coenzyme Q10 (COQ10 PO), Take 2 capsules by mouth daily., Disp: , Rfl:  .  ezetimibe (ZETIA) 10 MG tablet, Take 1 tablet (10 mg total) by mouth daily., Disp: 90 tablet, Rfl: 3 .  furosemide (LASIX) 80 MG tablet, Take 80 mg at 6 am and 80 mg at 1 pm, Disp: 180 tablet, Rfl: 3 .  isosorbide mononitrate (IMDUR) 30 MG 24 hr tablet, TAKE 2 TABLETS TWICE A DAY, Disp: 360 tablet, Rfl: 2 .  KLOR-CON M20 20 MEQ tablet, Take 2 tablets (40 mEq total) by mouth daily., Disp: 180 tablet, Rfl: 3 .  levothyroxine (SYNTHROID, LEVOTHROID) 112 MCG tablet, Take 112 mcg by mouth at bedtime. , Disp: , Rfl: 4 .  losartan-hydrochlorothiazide (HYZAAR) 100-12.5 MG tablet, TAKE 1 TABLET BY MOUTH EVERY DAY, Disp: 90 tablet, Rfl: 2 .  metolazone (ZAROXOLYN) 2.5 MG tablet, Take 1 tablet (2.5 mg total) by mouth as needed. Take 2.5 mg 30 min before AM dose of Lasix (only take when instructed to do so by Dr. Sallyanne Kuster), Disp: 30 tablet, Rfl: 6 .  mirabegron ER (MYRBETRIQ) 50 MG TB24 tablet, Take by mouth., Disp: , Rfl:  .  Multiple Vitamin (MULTIVITAMIN WITH MINERALS) TABS tablet, Take  1 tablet by mouth daily. Centrum 50+ , Disp: , Rfl:  .  nitroGLYCERIN (NITROSTAT) 0.4 MG SL tablet, Place 1 tablet (0.4 mg total) under the tongue every 5 (five) minutes as needed for chest pain., Disp: 25 tablet, Rfl: 2 .  nystatin (MYCOSTATIN/NYSTOP) powder, , Disp: , Rfl:  .  oxyCODONE (OXY IR/ROXICODONE) 5 MG immediate release tablet, Take 1 tablet (5 mg total) by mouth every 3 (three) hours as needed for moderate pain ((score 4 to 6))., Disp: 30 tablet, Rfl: 0 .  pravastatin (PRAVACHOL) 80 MG tablet, Take 80 mg by mouth at bedtime., Disp: , Rfl:  .  predniSONE (DELTASONE) 10 MG tablet, , Disp: , Rfl:  .  Specialty Vitamins Products (MAGNESIUM, AMINO ACID CHELATE,) 133 MG tablet, Take by mouth., Disp: , Rfl:  .  Vaginal Lubricant (REPLENS) GEL, USE AS DIRECTED ONCE DAILY VAGINAL 14 DAYS, Disp: , Rfl:  .  vitamin C  (ASCORBIC ACID) 500 MG tablet, Take 500 mg by mouth daily. , Disp: , Rfl:   Social History   Tobacco Use  Smoking Status Never Smoker  Smokeless Tobacco Never Used    Allergies  Allergen Reactions  . Atorvastatin Anaphylaxis    Patient does not remember; caused pneumonia- took her off of it per patient.   . Penicillins Anaphylaxis and Hives    PATIENT HAS HAD A PCN REACTION WITH IMMEDIATE RASH, FACIAL/TONGUE/THROAT SWELLING, SOB, OR LIGHTHEADEDNESS WITH HYPOTENSION:  #  #  #  YES  #  #  #   HAS PT DEVELOPED SEVERE RASH INVOLVING MUCUS MEMBRANES or SKIN NECROSIS: #  #  #  YES  #  #  #   PATIENT HAS HAD A PCN REACTION THAT REQUIRED HOSPITALIZATION:  #  #  #  YES  #  #  #  Has patient had a PCN reaction occurring within the last 10 years:#  #  #  YES  #  #  #    . Shellfish Allergy Anaphylaxis and Nausea And Vomiting    Severe nausea and vomiting  . Aminophylline Itching, Swelling and Rash    Pt experienced burning urination, itching, redness/rash, and swelling of genitals after given this medication via IV push.  . Iodinated Diagnostic Agents Swelling and Other (See Comments)    SWELLING REACTION UNSPECIFIED  fFLUSHING  . Latex Other (See Comments)    Causes blisters  . Oxybutynin Chloride Other (See Comments)    "blisters" "blisters"  . Vancomycin Hives    06/02/2018- Hives arm, back and chest  . Adhesive [Tape] Rash    Paper tape is ok  . Betadine [Povidone Iodine] Rash  . Ciprofloxacin Hives  . Codeine Nausea And Vomiting    .  Marland Kitchen Gelnique [Oxybutynin] Other (See Comments)    Causes blisters  . Ranolazine Other (See Comments)    Constipation  .   Objective:   There were no vitals filed for this visit. There is no height or weight on file to calculate BMI. Constitutional Well developed. Well nourished.  Vascular Dorsalis pedis pulses palpable bilaterally. Posterior tibial pulses palpable bilaterally. Capillary refill normal to all digits.  No cyanosis or clubbing  noted. Pedal hair growth normal.  Neurologic Normal speech. Oriented to person, place, and time. Epicritic sensation to light touch grossly present bilaterally.  Dermatologic Nails well groomed and normal in appearance. No open wounds. No skin lesions.  Orthopedic: POP R Ankle peroneal tendons POP R lateral ankle Local edema R lateral ankle No  pain medial ankle   Radiographs: None today Assessment:   1. Tear of peroneal tendon, right, sequela   2. Peroneal tendinitis of right lower leg   3. Gait disturbance    Plan:  Patient was evaluated and treated and all questions answered.  R Peroneal Sprain Sequela, Likely R Ankle Sprain, possible re-tear -Continue compression dressings as needed. -Given continued pain and swelling recalcitrant to conservative care will order MRI for pre-operative eval.  No follow-ups on file.

## 2019-03-17 ENCOUNTER — Telehealth: Payer: Self-pay | Admitting: Cardiovascular Disease

## 2019-03-17 NOTE — Telephone Encounter (Signed)
Spoke with pt and complained with being restless and uneasy all night finally fell asleep at 5:00 this morning and awoke around 8 and had taken meds Pt noted had to sit down while making breakfast and later checked B/P see readings below values are from 9:30 - 10:30 am Pt stated closed eyes and was seeing colors Pt was laying down when called Pt's weight is currently 218 Will forward message to Dr Sallyanne Kuster for review .Adonis Housekeeper

## 2019-03-17 NOTE — Telephone Encounter (Signed)
Pt aware of recommendations and will call back this afternoon with updated B/P./cy

## 2019-03-17 NOTE — Telephone Encounter (Signed)
New Message    STAT if patient feels like he/she is going to faint   1) Are you dizzy now? No, but feels that way when she sits up. She is laying down at the time of the call  2) Do you feel faint or have you passed out? No   3) Do you have any other symptoms?   4) Have you checked your HR and BP (record if available)? Patient has taken her BP and HR 3 times this morning:  73/52 HR 86 80/55 HR 84 96/54 HR 78

## 2019-03-17 NOTE — Telephone Encounter (Signed)
Please skip any additional meds that are scheduled today from the following list: furosemide, metolazone, amlodipine, losartan-HCTZ, isosorbide. Drink plenty of fluids and stay in bed this morning. Let us know what her BP is later, around 2-3 PM.  Call again tomorrow morning with BP before taking any meds.

## 2019-03-18 NOTE — Telephone Encounter (Signed)
SPOKE TO PATIENT INSTRUCTION  GIVEN VERBALIZED  UNDERSTANDING-  PATIENT STES SHE FELT BETTER TODAY B/P117/67 P 69

## 2019-03-18 NOTE — Telephone Encounter (Signed)
Forward to dr [employer] for further instructions

## 2019-03-18 NOTE — Telephone Encounter (Signed)
Follow up    Patient is calling to provider her vitals this morning as instructed. Her BP is 118/67 HR 69. Her weight is 221 it was 218 yesterday.

## 2019-03-18 NOTE — Telephone Encounter (Signed)
Please restart furosemide, amlodipine and isosorbide. Stay off metolazone and losartan HCT for another day. Hope she is feeling better.

## 2019-03-27 ENCOUNTER — Telehealth: Payer: Self-pay | Admitting: *Deleted

## 2019-03-27 ENCOUNTER — Ambulatory Visit
Admission: RE | Admit: 2019-03-27 | Discharge: 2019-03-27 | Disposition: A | Discharge status: Home / Self Care | Payer: HMO | Source: Ambulatory Visit | Attending: Podiatry | Admitting: Podiatry

## 2019-03-27 DIAGNOSIS — R269 Unspecified abnormalities of gait and mobility: Secondary | ICD-10-CM

## 2019-03-27 DIAGNOSIS — M7671 Peroneal tendinitis, right leg: Secondary | ICD-10-CM

## 2019-03-27 DIAGNOSIS — R600 Localized edema: Secondary | ICD-10-CM

## 2019-03-27 DIAGNOSIS — S86311S Strain of muscle(s) and tendon(s) of peroneal muscle group at lower leg level, right leg, sequela: Secondary | ICD-10-CM

## 2019-03-27 NOTE — Telephone Encounter (Signed)
-----   Message from [place] sent at 03/26/2019  4:52 PM EDT -----  Tivis Ringer he cc'd me the entire chart note  ----- Message ----- From: [name], DPM Sent: 03/12/2019   9:13 AM EDT To: Dimas Millin  Can we order R ankle MRI for Peroneal tear? Pre-operative eval

## 2019-03-27 NOTE — Telephone Encounter (Signed)
Faxed orders to [facility]. 

## 2019-04-06 ENCOUNTER — Telehealth: Payer: Self-pay | Admitting: Cardiovascular Disease

## 2019-04-06 ENCOUNTER — Other Ambulatory Visit: Payer: Self-pay

## 2019-04-06 NOTE — Progress Notes (Signed)
She already did. Thanks. MCr

## 2019-04-06 NOTE — Patient Outreach (Addendum)
  [facility]) Care Management Chronic Special Needs Program  04/06/2019  Name: Jill Preston DOB: August 26, 1933  MRN: [id number]  [name] is enrolled in a chronic special needs plan for Heart Failure. Reviewed and updated care plan.  Subjective: Client states that she has more swelling in her legs and her weight has been going up and down.  States she does not have an appt with Dr. Sallyanne Kuster until later in August.  States she gets SOB when she moves around for 5-10 minutes.  Denies any chest pains unless she does too much.  States she is following a low salt diet most of the time.  States her weights have ranged from 223-230 this last week  Goals Addressed            This Visit's Progress   . Client understands the importance of follow-up with providers by attending scheduled visits   On track   . Client will increase activity tolerance by  reported less SOB within 6 months-continue 04/06/19   No change    Pace activity    . Client will not report change from baseline and no repeated symptoms of stroke with in the next 12 months    On track   . Client will report no worsening of symptoms related to heart disease within the next 6 months continue 04/06/19   On track    Call Dr. Sallyanne Kuster for weight gain of 3 lbs in one day or 5 lbs in one week    . Client will verbalize knowledge of self management of Hypertension as evidences by BP reading of 140/90 or less; or as defined by provider   On track   . Decrease inpatient Heart Failure admissions/ readmissions with in the year   On track   . Decrease the use of hospital emergency department related to heart failure within the next year   On track   . Maintain timely refills of Heart Failure medication as prescribed within the year    On track    [facility] pharmacist will call you to review your medications    . COMPLETED: Obtain annual  Lipid Profile, LDL-C       Completed 11/04/18    . Visit  Primary Care Provider or Cardiologist at least 2 times per year   On track    Client with increased c/o swelling in legs and weight fluctuations from 223-230. SOB with minimal exertion.  Client last contacted Dr. Sallyanne Kuster 03/17/19 with changes made to her medications Instructed client to call Dr.Croitoru today to report her weight and increased swelling.   Reviewed Heart failure action plan zones and when to call her doctor. Reinforced to follow a low sodium diet. Reviewed number for 24 hour nurse Line Discussed COVID19 cause, symptoms, precautions (social distancing, stay at home order, hand washing), confirmed client knows how to contact provider.  Plan:  Send successful outreach letter with a copy of their individualized care plan and Send individual care plan to provider, Note routed to cardiology  Chronic care management coordinator will outreach in:  4-5 Months     [place], [facility], [place] Management Coordinator [place] Management [phone]

## 2019-04-06 NOTE — Telephone Encounter (Signed)
I think we will have to continue giving her the furosemide tailored for her weight.  Any time her weight reaches 230 pounds she should take twice the usual dose of diuretic until she gets back down to 225 pounds.  Sometimes the reason for fluid accumulation will be obvious (salty meal) other times it may be hard to figure out. From the past several communications is pretty clear that at 220 pounds she is "too dry" and at 230 pounds she is "too wet". So, I suggest:  - if weight is under 220 lb, do NOT take the diuretic  - if weight is 221-229 lb, take ONE diuretic tablet  -if weight is 230 lb or higher, take TWO diuretic tablets. Please clarify whether she is currently taking the losartan HCT

## 2019-04-06 NOTE — Telephone Encounter (Signed)
Call returned to the patient to give the provider's recommendations.  She stated that she no longer takes: Amlodipine Losartan-HCTZ Metolazone  Imdur: she takes 60 mg once daily  Furosemide: she takes 80 mg once daily.   She was taking the Furosemide 80 mg bid for a weight over 214 pounds but she stated that she never reached that weight so she stayed on the 80 mg once daily. For the past month her weight has been hanging around the mid 220's. She stated that she cannot take Furosemide 80 mg bid for more than one day because it makes her too lethargic so she will take it one day and then go back to her 80 mg once daily therefore she has the fluctuating weight. She was asked if she would take the 80 bid for at least two days and she stated that it makes her too lethargic so she would rather not.  She has been elevating her feet daily and now she has been wearing her husband'ss shirt due to the increased fluid. She has shortness of breath on exertion only.

## 2019-04-06 NOTE — Telephone Encounter (Signed)
Called patient- she states that she noticed her weight going up at the end of last week, but before she could call we had closed for the weekend. She took two of her fluid pills Saturday and Sunday, and the weight this morning had come down to 223lb. But she states any other day she only takes one pill as she feels if she takes two it drys her out and she is unable to urinate as well. She just checked her BP it was 150/83; 85HR, she is swelling and states it is all over, denies chest pain, and only has SOB on exertion. She would like to know what recommendations Dr.C has for her for her weight, she has had no changes in her lifestyle, no med changes. Will route to MD. See weight log below.

## 2019-04-06 NOTE — Telephone Encounter (Signed)
° °  Pt c/o swelling: STAT is pt has developed SOB within 24 hours  1) How much weight have you gained and in what time span?   2) If swelling, where is the swelling located? everywhere  3) Are you currently taking a fluid pill? Yes.   4) Are you currently SOB? When she gets up and moving  5) Do you have a log of your daily weights (if so, list)?   Tuesday 224  Wednesday 226  Thursday 228  Friday 230  Sunday 228  Monday 223  6) Have you gained 3 pounds in a day or 5 pounds in a week? no  7) Have you traveled recently? no  The patient noticed her weight start to creep up at the end of last week. When she noticed it was so high, she tried to call the office, but we were already closed.  She took both fluid pills as requested when this happens, and managed to get her weight down a little over the weekend.   The patient says she just feels fatigued all the time. She spends a lot of time in bed or with her feet up in a chair, as movement makes her short of breath.  The patient also states that she is not eating much salt, but is having a hard time getting her weight down. She states there are days where she can not produce much urine after taking 2 fluid pills, so she takes onetp prevent dehydration.   She would like to find a happy medium.    She had a check in with her home health nurse, and  was told  to make Dr. Loletha Grayer aware of the situation.

## 2019-04-07 ENCOUNTER — Telehealth: Payer: Self-pay

## 2019-04-07 NOTE — Telephone Encounter (Signed)
Pt. Called wanting to know her MRI results and what treatment she should be doing at home.

## 2019-04-08 NOTE — Telephone Encounter (Signed)
The patient has been made aware to keep with the lasix scheduled listed below. She stated that she is now down 2 pounds since Monday.

## 2019-04-08 NOTE — Telephone Encounter (Signed)
Take the diuretics as prescribed based on her weight in last message.

## 2019-04-08 NOTE — Telephone Encounter (Signed)
Called and reviewed MRI with Patient

## 2019-04-10 ENCOUNTER — Ambulatory Visit: Payer: HMO | Admitting: Podiatry

## 2019-04-13 ENCOUNTER — Telehealth: Payer: Self-pay | Admitting: *Deleted

## 2019-04-13 DIAGNOSIS — L718 Other rosacea: Secondary | ICD-10-CM | POA: Diagnosis not present

## 2019-04-13 DIAGNOSIS — R269 Unspecified abnormalities of gait and mobility: Secondary | ICD-10-CM

## 2019-04-13 DIAGNOSIS — S86311S Strain of muscle(s) and tendon(s) of peroneal muscle group at lower leg level, right leg, sequela: Secondary | ICD-10-CM

## 2019-04-13 DIAGNOSIS — L814 Other melanin hyperpigmentation: Secondary | ICD-10-CM | POA: Diagnosis not present

## 2019-04-13 DIAGNOSIS — L821 Other seborrheic keratosis: Secondary | ICD-10-CM | POA: Diagnosis not present

## 2019-04-13 DIAGNOSIS — L820 Inflamed seborrheic keratosis: Secondary | ICD-10-CM | POA: Diagnosis not present

## 2019-04-13 DIAGNOSIS — M7671 Peroneal tendinitis, right leg: Secondary | ICD-10-CM

## 2019-04-13 DIAGNOSIS — R600 Localized edema: Secondary | ICD-10-CM

## 2019-04-13 NOTE — Telephone Encounter (Signed)
[facility] - [facility]. And[facility] Elk states they do not accept HTA and will forward the referral to [facility].

## 2019-04-13 NOTE — Telephone Encounter (Signed)
-----   Message from [name], DPM sent at 04/08/2019 10:51 AM EDT ----- Marcy Siren - can we order home health PT for Peroneal tear right ankle? She had a HHC she really liked after her last surgery not sure who it was.  Estill Bamberg - can we cancel her appt Friday and call her to move it to about 3 weeks from now?

## 2019-04-13 NOTE — Telephone Encounter (Signed)
Faxed required form, LOV and demographics to [facility].

## 2019-04-17 DIAGNOSIS — Z9181 History of falling: Secondary | ICD-10-CM | POA: Diagnosis not present

## 2019-04-17 DIAGNOSIS — N183 Chronic kidney disease, stage 3 (moderate): Secondary | ICD-10-CM | POA: Diagnosis not present

## 2019-04-17 DIAGNOSIS — Z85828 Personal history of other malignant neoplasm of skin: Secondary | ICD-10-CM | POA: Diagnosis not present

## 2019-04-17 DIAGNOSIS — I5032 Chronic diastolic (congestive) heart failure: Secondary | ICD-10-CM | POA: Diagnosis not present

## 2019-04-17 DIAGNOSIS — M1711 Unilateral primary osteoarthritis, right knee: Secondary | ICD-10-CM | POA: Diagnosis not present

## 2019-04-17 DIAGNOSIS — Z79891 Long term (current) use of opiate analgesic: Secondary | ICD-10-CM | POA: Diagnosis not present

## 2019-04-17 DIAGNOSIS — E785 Hyperlipidemia, unspecified: Secondary | ICD-10-CM | POA: Diagnosis not present

## 2019-04-17 DIAGNOSIS — I130 Hypertensive heart and chronic kidney disease with heart failure and stage 1 through stage 4 chronic kidney disease, or unspecified chronic kidney disease: Secondary | ICD-10-CM | POA: Diagnosis not present

## 2019-04-17 DIAGNOSIS — Z7982 Long term (current) use of aspirin: Secondary | ICD-10-CM | POA: Diagnosis not present

## 2019-04-17 DIAGNOSIS — E039 Hypothyroidism, unspecified: Secondary | ICD-10-CM | POA: Diagnosis not present

## 2019-04-17 DIAGNOSIS — Z8701 Personal history of pneumonia (recurrent): Secondary | ICD-10-CM | POA: Diagnosis not present

## 2019-04-17 DIAGNOSIS — I2510 Atherosclerotic heart disease of native coronary artery without angina pectoris: Secondary | ICD-10-CM | POA: Diagnosis not present

## 2019-04-17 DIAGNOSIS — S86311S Strain of muscle(s) and tendon(s) of peroneal muscle group at lower leg level, right leg, sequela: Secondary | ICD-10-CM | POA: Diagnosis not present

## 2019-04-17 DIAGNOSIS — M7671 Peroneal tendinitis, right leg: Secondary | ICD-10-CM | POA: Diagnosis not present

## 2019-04-17 DIAGNOSIS — Z8673 Personal history of transient ischemic attack (TIA), and cerebral infarction without residual deficits: Secondary | ICD-10-CM | POA: Diagnosis not present

## 2019-04-23 DIAGNOSIS — Z85828 Personal history of other malignant neoplasm of skin: Secondary | ICD-10-CM | POA: Diagnosis not present

## 2019-04-23 DIAGNOSIS — E039 Hypothyroidism, unspecified: Secondary | ICD-10-CM | POA: Diagnosis not present

## 2019-04-23 DIAGNOSIS — Z79891 Long term (current) use of opiate analgesic: Secondary | ICD-10-CM | POA: Diagnosis not present

## 2019-04-23 DIAGNOSIS — S86311S Strain of muscle(s) and tendon(s) of peroneal muscle group at lower leg level, right leg, sequela: Secondary | ICD-10-CM | POA: Diagnosis not present

## 2019-04-23 DIAGNOSIS — I130 Hypertensive heart and chronic kidney disease with heart failure and stage 1 through stage 4 chronic kidney disease, or unspecified chronic kidney disease: Secondary | ICD-10-CM | POA: Diagnosis not present

## 2019-04-23 DIAGNOSIS — M1711 Unilateral primary osteoarthritis, right knee: Secondary | ICD-10-CM | POA: Diagnosis not present

## 2019-04-23 DIAGNOSIS — E785 Hyperlipidemia, unspecified: Secondary | ICD-10-CM | POA: Diagnosis not present

## 2019-04-23 DIAGNOSIS — I5032 Chronic diastolic (congestive) heart failure: Secondary | ICD-10-CM | POA: Diagnosis not present

## 2019-04-23 DIAGNOSIS — Z7982 Long term (current) use of aspirin: Secondary | ICD-10-CM | POA: Diagnosis not present

## 2019-04-23 DIAGNOSIS — M7671 Peroneal tendinitis, right leg: Secondary | ICD-10-CM | POA: Diagnosis not present

## 2019-04-23 DIAGNOSIS — Z9181 History of falling: Secondary | ICD-10-CM | POA: Diagnosis not present

## 2019-04-23 DIAGNOSIS — I2510 Atherosclerotic heart disease of native coronary artery without angina pectoris: Secondary | ICD-10-CM | POA: Diagnosis not present

## 2019-04-23 DIAGNOSIS — N183 Chronic kidney disease, stage 3 (moderate): Secondary | ICD-10-CM | POA: Diagnosis not present

## 2019-04-23 DIAGNOSIS — Z8673 Personal history of transient ischemic attack (TIA), and cerebral infarction without residual deficits: Secondary | ICD-10-CM | POA: Diagnosis not present

## 2019-04-23 DIAGNOSIS — Z8701 Personal history of pneumonia (recurrent): Secondary | ICD-10-CM | POA: Diagnosis not present

## 2019-05-04 ENCOUNTER — Telehealth: Payer: Self-pay | Admitting: Podiatry

## 2019-05-04 NOTE — Telephone Encounter (Signed)
[facility] called to let the doctor know that the patient missed one of her physical therapy appts last week.

## 2019-05-05 DIAGNOSIS — I130 Hypertensive heart and chronic kidney disease with heart failure and stage 1 through stage 4 chronic kidney disease, or unspecified chronic kidney disease: Secondary | ICD-10-CM | POA: Diagnosis not present

## 2019-05-05 DIAGNOSIS — Z7982 Long term (current) use of aspirin: Secondary | ICD-10-CM | POA: Diagnosis not present

## 2019-05-05 DIAGNOSIS — M1711 Unilateral primary osteoarthritis, right knee: Secondary | ICD-10-CM | POA: Diagnosis not present

## 2019-05-05 DIAGNOSIS — Z8701 Personal history of pneumonia (recurrent): Secondary | ICD-10-CM | POA: Diagnosis not present

## 2019-05-05 DIAGNOSIS — Z79891 Long term (current) use of opiate analgesic: Secondary | ICD-10-CM | POA: Diagnosis not present

## 2019-05-05 DIAGNOSIS — N183 Chronic kidney disease, stage 3 (moderate): Secondary | ICD-10-CM | POA: Diagnosis not present

## 2019-05-05 DIAGNOSIS — E785 Hyperlipidemia, unspecified: Secondary | ICD-10-CM | POA: Diagnosis not present

## 2019-05-05 DIAGNOSIS — S86311S Strain of muscle(s) and tendon(s) of peroneal muscle group at lower leg level, right leg, sequela: Secondary | ICD-10-CM | POA: Diagnosis not present

## 2019-05-05 DIAGNOSIS — E039 Hypothyroidism, unspecified: Secondary | ICD-10-CM | POA: Diagnosis not present

## 2019-05-05 DIAGNOSIS — Z85828 Personal history of other malignant neoplasm of skin: Secondary | ICD-10-CM | POA: Diagnosis not present

## 2019-05-05 DIAGNOSIS — I2510 Atherosclerotic heart disease of native coronary artery without angina pectoris: Secondary | ICD-10-CM | POA: Diagnosis not present

## 2019-05-05 DIAGNOSIS — Z9181 History of falling: Secondary | ICD-10-CM | POA: Diagnosis not present

## 2019-05-05 DIAGNOSIS — M7671 Peroneal tendinitis, right leg: Secondary | ICD-10-CM | POA: Diagnosis not present

## 2019-05-05 DIAGNOSIS — Z8673 Personal history of transient ischemic attack (TIA), and cerebral infarction without residual deficits: Secondary | ICD-10-CM | POA: Diagnosis not present

## 2019-05-05 DIAGNOSIS — I5032 Chronic diastolic (congestive) heart failure: Secondary | ICD-10-CM | POA: Diagnosis not present

## 2019-05-12 ENCOUNTER — Telehealth: Payer: Self-pay | Admitting: *Deleted

## 2019-05-12 NOTE — Telephone Encounter (Signed)
[facility] states they are discharging pt from [facility] because she is not meeting her goal, she is able to stand and walk short distance, has full ROM, but pt spends a lot of time sitting due to the weakness in the right foot, and she feels that pt may need further testing.

## 2019-05-12 NOTE — Telephone Encounter (Signed)
Ok noted. thanks

## 2019-05-13 ENCOUNTER — Ambulatory Visit (INDEPENDENT_AMBULATORY_CARE_PROVIDER_SITE_OTHER): Payer: HMO | Admitting: Cardiovascular Disease

## 2019-05-13 ENCOUNTER — Encounter: Payer: Self-pay | Admitting: Cardiovascular Disease

## 2019-05-13 ENCOUNTER — Other Ambulatory Visit: Payer: Self-pay

## 2019-05-13 VITALS — BP 136/89 | HR 81 | Temp 95.9°F | Ht 65.5 in | Wt 220.2 lb

## 2019-05-13 DIAGNOSIS — E7800 Pure hypercholesterolemia, unspecified: Secondary | ICD-10-CM

## 2019-05-13 DIAGNOSIS — E66812 Obesity, class 2: Secondary | ICD-10-CM

## 2019-05-13 DIAGNOSIS — I25708 Atherosclerosis of coronary artery bypass graft(s), unspecified, with other forms of angina pectoris: Secondary | ICD-10-CM | POA: Diagnosis not present

## 2019-05-13 DIAGNOSIS — I739 Peripheral vascular disease, unspecified: Secondary | ICD-10-CM | POA: Diagnosis not present

## 2019-05-13 DIAGNOSIS — I5032 Chronic diastolic (congestive) heart failure: Secondary | ICD-10-CM

## 2019-05-13 DIAGNOSIS — I10 Essential (primary) hypertension: Secondary | ICD-10-CM | POA: Diagnosis not present

## 2019-05-13 DIAGNOSIS — Z6835 Body mass index (BMI) 35.0-35.9, adult: Secondary | ICD-10-CM

## 2019-05-13 NOTE — Patient Instructions (Signed)

## 2019-05-13 NOTE — Progress Notes (Signed)
Patient ID: Jill Preston, female   DOB: 1932/12/08, 85 y.o.   MRN: [id number] Patient ID: Jill Preston, female   DOB: Nov 14, 1932, 85 y.o.   MRN: [id number]    Cardiology Office Note    Date:  05/13/2019   ID:  Jill Preston, DOB Jan 08, 1933, MRN [id number]  PCP:  Merrilee Seashore, MD  Cardiologist:   Sanda Klein, MD   chief complaint: Fatigue   History of Present Illness:  Jill Preston is a 85 y.o. female returning in follow-up For CAD and CHF.  [name] has had a difficult few months, trying to keep the balance between edema and dyspnea due to diastolic heart failure versus hypotension and worsening renal function due to diuretics.  Most recently gained fluid while receiving steroids for orthopedic problems, causing pedal edema and worsening dyspnea.  She ruptured the tendon in her right ankle again and is wearing an orthopedic boot.  After increasing the dose of diuretics she is getting a little better, but is still roughly 2 pounds above what she considers to be her optimal fluid situation (214 pounds on her home scale).  Our office scale reports her weight roughly 4 pounds higher than her home scale.  This reported weight is higher than what I had previously estimated to be her "dry weight" at about 207 pounds on our office scale  She is taking furosemide 80 mg a day every morning and then takes an optional second 80 mg dose based on fluid status in the afternoon.  On the average, she takes the second dose about twice a week.  Currently she denies orthopnea, PND and has only trace ankle edema.  She denies angina pectoris at rest or with limited activity that she can perform.  She has a long history of coronary disease. She underwent proximal LAD bare-metal stenting and balloon angioplasty of the mid LAD in 2013. She has had persistent ischemia in the territory of the diagonal artery on nuclear stress test performed over the last few years. Coronary angiography  performed May 2016 showed a widely patent LAD stent, 60% ostial circumflex stenosis, 20-30% right coronary artery stenosis. There was concern about possible left coronary artery stenosis but fractional flow reserve was normal (0.96 at baseline, 0.91 during intravenous adenosine infusion). She had good anginal response to Ranexa but developed severe constipation and could not tolerate the medication. She has been intolerant to beta blockers due to bradycardia. She has preserved left ventricular systolic function but has had episodes of acute exacerbation of diastolic heart failure attributed to hypertensive heart disease. December 2015, she was critically ill with an acute left iliofemoral DVT with anticoagulation complicated by retroperitoneal hematoma and hemorrhagic shock, requiring placement of an inferior vena cava filter. The filter was removed in June 2016. Coronary angiography in July 2018 showed unchanged anatomy of the eccentric stenosis in the left main coronary artery with noncritical FFR (0.89 in the LAD, 0.86 in the LCx).  Intravascular Pressure Wire/FFR Study  Left Heart Cath and Coronary Angiography  Conclusion     LM lesion, 70 %stenosed.  Mid LAD lesion, 0 %stenosed at site of prior stent.  Ost Cx lesion, 60 %stenosed.  Prox RCA-1 lesion, 20 %stenosed.  Prox RCA-2 lesion, 30 %stenosed.  The left ventricular systolic function is normal.  LV end diastolic pressure is normal.  The left ventricular ejection fraction is 55-65% by visual estimate.   1. 70% eccentric distal left main stenosis. Angiographically similar to 2016. FFR 0.89 into  the LAD and 0.86 into the LCx suggesting that this lesion is not flow limiting.  2. Otherwise nonobstructive CAD. Patent stent in LAD. 3. Normal LV function 4. Normal LVEDP  Plan: recommend continued medical therapy.     Past Medical History:  Diagnosis Date  . Arthritis    right knee  . Cancer [facility]) yrs ago   skin cancer removed  from face  . Chronic diastolic CHF (congestive heart failure) ([place])   . Chronic venous insufficiency    LEA VENOUS, 10/17/2011 - mild reflux in bilateral common femoral veins  . CKD (chronic kidney disease), stage III ([place])   . Coronary artery disease    a. s/p PCI/BMS to prox LAD and balloon angioplasty to mLAD with suboptimal result in 2000. b. Abnl nuc 2012, cath 09/2011 - showed that the overall territory of potential ischemia was small and attributable to a moderately diseased small second diagonal artery. Med rx.  Marland Kitchen Dyspnea    with activity  . Headache   . History of blood transfusion 2017  . Hyperlipidemia   . Hypertension   . Hypertensive heart disease   . Hypothyroidism   . Obesity   . Pneumonia    several times  . Stroke [facility])    pt. states she had "light stroke" in sept. 1980  . Urinary incontinence     Past Surgical History:  Procedure Laterality Date  . ABDOMINAL HYSTERECTOMY  1970's   complete  . ANKLE ARTHROSCOPY Right 07/10/2017   Procedure: ANKLE ARTHROSCOPY;  Surgeon: [name], DPM;  Location: [place];  Service: Podiatry;  Laterality: Right;  . BACK SURGERY  07/26/2016   cervical neck surgery, [facility]  . BLADDER SURGERY  2010   WITH MESH  bladder tach  . bunion removal surgery Bilateral 15 yrs ago  . CARDIAC CATHETERIZATION Left 09/25/2011   Medical management  . CARDIAC CATHETERIZATION Left 03/25/2001   Normal LV function, LAD residual narrowing of less than 10%, normal ramus intermediate, circumflex, and RCA,   . CARDIAC CATHETERIZATION  09/04/1999   LAD, 3x22mm Tetra stent resulting in a reduction of the 80% stenosis to 0% residual  . CARDIAC CATHETERIZATION N/A 01/26/2015   Procedure: Right/Left Heart Cath and Coronary Angiography;  Surgeon: Troy Sine, MD;  Location: [place] CV LAB;  Service: Cardiovascular;  Laterality: N/A;  . CARDIAC CATHETERIZATION N/A 01/27/2015   Procedure: Intravascular Pressure Wire/FFR Study;  Surgeon:  Burnell Blanks, MD;  Location: [place] CV LAB;  Service: Cardiovascular;  Laterality: N/A;  . CARDIAC CATHETERIZATION N/A 01/27/2015   Procedure: Right Heart Cath;  Surgeon: Burnell Blanks, MD;  Location: [place] CV LAB;  Service: Cardiovascular;  Laterality: N/A;  . CHOLECYSTECTOMY    . COLONOSCOPY WITH PROPOFOL N/A 03/07/2017   Procedure: COLONOSCOPY WITH PROPOFOL;  Surgeon: Juanita Craver, MD;  Location: [facility];  Service: Endoscopy;  Laterality: N/A;  . CORONARY STENT PLACEMENT     LAD x 1  . ganglion cyst removal  yrs ago   x 2  . ILIAC VEIN ANGIOPLASTY / STENTING  02/15/2015  . INTRAVASCULAR PRESSURE WIRE/FFR STUDY N/A 04/05/2017   Procedure: Intravascular Pressure Wire/FFR Study;  Surgeon: Martinique, Peter M, MD;  Location: [place] [facility];  Service: Cardiovascular;  Laterality: N/A;  . IVC FILTER INSERTION  2016  . IVC FILTER REMOVAL  02/15/2015   at [facility]  . LEFT HEART CATH AND CORONARY ANGIOGRAPHY N/A 04/05/2017   Procedure: Left Heart Cath and  Coronary Angiography;  Surgeon: Martinique, Peter M, MD;  Location: [place] CV LAB;  Service: Cardiovascular;  Laterality: N/A;  . LEFT HEART CATHETERIZATION WITH CORONARY ANGIOGRAM N/A 09/25/2011   Procedure: LEFT HEART CATHETERIZATION WITH CORONARY ANGIOGRAM;  Surgeon: Sanda Klein, MD;  Location: [place] CATH LAB;  Service: Cardiovascular;  Laterality: N/A;  . multiple bladder surgeries to remove mesh    . ROTATOR CUFF REPAIR Right   . stent to groin Left 08/2014   left leg  . TENDON REPAIR Right 07/10/2017   Procedure: RIGHT PERONEAL TENDON REPAIR;  Surgeon: [name], DPM;  Location: [place];  Service: Podiatry;  Laterality: Right;    Outpatient Medications Prior to Visit  Medication Sig Dispense Refill  . amLODipine (NORVASC) 2.5 MG tablet Take 1 tablet (2.5 mg total) by mouth daily. 180 tablet 3  . aspirin EC 81 MG tablet Take 1 tablet (81 mg total) by mouth daily. 90 tablet 3  . bisacodyl (DULCOLAX) 5 MG  EC tablet Take 5 mg by mouth 2 (two) times daily.    . clobetasol ointment (TEMOVATE) 0.05 % Please use a pea sized amount daily    . Coenzyme Q10 (COQ10 PO) Take 2 capsules by mouth daily.    Marland Kitchen ezetimibe (ZETIA) 10 MG tablet Take 1 tablet (10 mg total) by mouth daily. 90 tablet 3  . furosemide (LASIX) 80 MG tablet Take 80 mg at 6 am and 80 mg at 1 pm 180 tablet 3  . isosorbide mononitrate (IMDUR) 30 MG 24 hr tablet TAKE 2 TABLETS TWICE A DAY (Patient taking differently: No sig reported) 360 tablet 2  . KLOR-CON M20 20 MEQ tablet Take 2 tablets (40 mEq total) by mouth daily. 180 tablet 3  . levothyroxine (SYNTHROID, LEVOTHROID) 112 MCG tablet Take 112 mcg by mouth at bedtime.   4  . losartan-hydrochlorothiazide (HYZAAR) 100-12.5 MG tablet TAKE 1 TABLET BY MOUTH EVERY DAY 90 tablet 2  . metolazone (ZAROXOLYN) 2.5 MG tablet Take 1 tablet (2.5 mg total) by mouth as needed. Take 2.5 mg 30 min before AM dose of Lasix (only take when instructed to do so by Dr. Sallyanne Kuster) 30 tablet 6  . mirabegron ER (MYRBETRIQ) 50 MG TB24 tablet Take by mouth.    . Multiple Vitamin (MULTIVITAMIN WITH MINERALS) TABS tablet Take 1 tablet by mouth daily. Centrum 50+     . nitroGLYCERIN (NITROSTAT) 0.4 MG SL tablet Place 1 tablet (0.4 mg total) under the tongue every 5 (five) minutes as needed for chest pain. 25 tablet 2  . nystatin (MYCOSTATIN/NYSTOP) powder     . oxyCODONE (OXY IR/ROXICODONE) 5 MG immediate release tablet Take 1 tablet (5 mg total) by mouth every 3 (three) hours as needed for moderate pain ((score 4 to 6)). 30 tablet 0  . pravastatin (PRAVACHOL) 80 MG tablet Take 80 mg by mouth at bedtime.    . predniSONE (DELTASONE) 10 MG tablet     . Specialty Vitamins Products (MAGNESIUM, AMINO ACID CHELATE,) 133 MG tablet Take by mouth.    . Vaginal Lubricant (REPLENS) GEL USE AS DIRECTED ONCE DAILY VAGINAL 14 DAYS    . vitamin C (ASCORBIC ACID) 500 MG tablet Take 500 mg by mouth daily.      No facility-administered  medications prior to visit.      Allergies:   Atorvastatin, Penicillins, Shellfish allergy, Aminophylline, Iodinated diagnostic agents, Latex, Oxybutynin chloride, Vancomycin, Adhesive [tape], Betadine [povidone iodine], Ciprofloxacin, Codeine, Gelnique [oxybutynin], and Ranolazine   Social History  Socioeconomic History  . Marital status: Married    Spouse name: Not on file  . Number of children: Not on file  . Years of education: Not on file  . Highest education level: Not on file  Occupational History  . Not on file  Social Needs  . Financial resource strain: Not on file  . Food insecurity    Worry: Never true    Inability: Never true  . Transportation needs    Medical: No    Non-medical: No  Tobacco Use  . Smoking status: Never Smoker  . Smokeless tobacco: Never Used  Substance and Sexual Activity  . Alcohol use: No  . Drug use: No  . Sexual activity: Not on file  Lifestyle  . Physical activity    Days per week: Not on file    Minutes per session: Not on file  . Stress: Not on file  Relationships  . Social Herbalist on phone: Not on file    Gets together: Not on file    Attends religious service: Not on file    Active member of club or organization: Not on file    Attends meetings of clubs or organizations: Not on file    Relationship status: Not on file  Other Topics Concern  . Not on file  Social History Narrative  . Not on file     Family History:  The patient's family history includes Cancer in her mother; Heart disease in her brother, father, and sister.   ROS:   Please see the history of present illness.    ROS All other systems are reviewed and are negative  PHYSICAL EXAM:   VS:  BP 136/89   Pulse 81   Temp (!) 95.9 F (35.5 C)   Ht 5' 5.5" (1.664 m)   Wt 220 lb 3.2 oz (99.9 kg)   SpO2 93%   BMI 36.09 kg/m      General: Alert, oriented x3, no distress, severely obese Head: no evidence of trauma, PERRL, EOMI, no exophtalmos or  lid lag, no myxedema, no xanthelasma; normal ears, nose and oropharynx Neck: normal jugular venous pulsations and no hepatojugular reflux; brisk carotid pulses without delay and no carotid bruits Chest: clear to auscultation, no signs of consolidation by percussion or palpation, normal fremitus, symmetrical and full respiratory excursions Cardiovascular: normal position and quality of the apical impulse, regular rhythm, normal first and second heart sounds, no murmurs, rubs or gallops Abdomen: no tenderness or distention, no masses by palpation, no abnormal pulsatility or arterial bruits, normal bowel sounds, no hepatosplenomegaly Extremities: no clubbing, cyanosis or edema; 2+ radial, ulnar and brachial pulses bilaterally; 2+ right femoral, posterior tibial and dorsalis pedis pulses; 2+ left femoral, posterior tibial and dorsalis pedis pulses; no subclavian or femoral bruits Neurological: grossly nonfocal Psych: Normal mood and affect    Wt Readings from Last 3 Encounters:  05/13/19 220 lb 3.2 oz (99.9 kg)  08/01/18 207 lb 6.4 oz (94.1 kg)  06/02/18 205 lb (93 kg)      Studies/Labs Reviewed:   EKG:  EKG is ordered today.  It shows normal sinus rhythm and is a normal tracing with exception of mildly prolonged QTC 479 ms Recent Labs: 06/03/2018: Hemoglobin 13.1; Platelets 165 12/02/2018: Magnesium 1.6 12/22/2018: BNP 60.4 01/06/2019: BUN 30; Creatinine, Ser 0.99; Potassium 3.7; Sodium 142   Lipid Panel    Component Value Date/Time   CHOL 193 11/18/2017 0857   TRIG 212 (H) 11/18/2017 [id number]  HDL 47 11/18/2017 0857   CHOLHDL 4.1 11/18/2017 0857   CHOLHDL 4.2 06/27/2015 1336   VLDL 48 (H) 06/27/2015 1336   LDLCALC 104 (H) 11/18/2017 0857   Labs November 04, 2018 Total cholesterol 159, HDL 59, LDL 77, calcium 750 Normal liver function tests, creatinine 0.99, potassium 3.7   CATH 04/05/2017   LM lesion, 70 %stenosed.  Mid LAD lesion, 0 %stenosed at site of prior stent.  Ost Cx  lesion, 60 %stenosed.  Prox RCA-1 lesion, 20 %stenosed.  Prox RCA-2 lesion, 30 %stenosed.  The left ventricular systolic function is normal.  LV end diastolic pressure is normal.  The left ventricular ejection fraction is 55-65% by visual estimate.   1. 70% eccentric distal left main stenosis. Angiographically similar to 2016. FFR 0.89 into the LAD and 0.86 into the LCx suggesting that this lesion is not flow limiting.  2. Otherwise nonobstructive CAD. Patent stent in LAD. 3. Normal LV function 4. Normal LVEDP  Plan: recommend continued medical therapy.   ASSESSMENT:    1. Essential hypertension      PLAN:  In order of problems listed above:  1. CHF: As far as I can tell she is close to euvolemic status, although obesity makes it very hard to assess this clinically.  She is quite sedentary but for her current level of activity denies dyspnea.  She has only trace ankle swelling, mostly in the injured right ankle.  She has figured out a reasonable diuretic dose adjustments to maintain best fluid status.  She has a narrow margin of compensation between heart failure and kidney failure.  A large part of her dyspnea is attributable to obesity and deconditioning.  2. CAD: She does not have angina chronic medical regimen which includes amlodipine, on long-acting nitrates but without beta blockers which were poorly tolerated.  Note that at 1 point she had shortness of breath that responded to treatment with amlodipine, suggesting it may have been an anginal equivalent and may be related to vasospasm.  She is known to have a moderate stenosis in the left main coronary artery that was not significant by pressure wire analysis at cardiac catheterization performed a in July 2018.   3. HTN: Blood pressure should always be checked in her right arm since she appears to have left subclavian artery stenosis.  Adequate, although not perfect blood pressure control today.  No changes are made to her  medications. 4. PAD: Asymptomatic.  Lower blood pressure in her left arm.  Does not have symptoms of left subclavian steal syndrome or left arm claudication 5. Hyperlipidemia: She is on the maximum dose of pravastatin and has not tolerated other, more potent statins.  She is close enough to the target LDL cholesterol of less than 70 and prefers not to start an expensive medication such as Repatha or Praluent.  Triglycerides are now normal. 6. CKD: Her most recent creatinine was actually much better at 0.99 on January 06, 2019.  Her "CKD" may have simply been related to excessive diuresis. 7. Obesity: She has gained true weight I think.  Carbohydrate restricted diet, gradually increasing physical activity and further attempts at weight loss would be of benefit.   Medication Adjustments/Labs and Tests Ordered: Current medicines are reviewed at length with the patient today.  Concerns regarding medicines are outlined above.  Medication changes, Labs and Tests ordered today are listed in the Patient Instructions below. Patient Instructions  Medication Instructions:  Your physician recommends that you continue on your current medications  as directed. Please refer to the Current Medication list given to you today.  If you need a refill on your cardiac medications before your next appointment, please call your pharmacy.   Lab work: None ordered If you have labs (blood work) drawn today and your tests are completely normal, you will receive your results only by: [place] (if you have MyChart) OR A paper copy in the mail If you have any lab test that is abnormal or we need to change your treatment, we will call you to review the results.  Testing/Procedures: None ordered  Follow-Up: At [facility], you and your health needs are our priority.  As part of our continuing mission to provide you with exceptional heart care, we have created designated Provider Care Teams.  These Care Teams include  your primary Cardiologist (physician) and Advanced Practice Providers (APPs -  Physician Assistants and Nurse Practitioners) who all work together to provide you with the care you need, when you need it. You will need a follow up appointment in 6 months.  Please call our office 2 months in advance to schedule this appointment.  You may see Sanda Klein, MD or one of the following Advanced Practice Providers on your designated Care Team: Almyra Deforest, PA-C Fabian Sharp, [place]            Signed, Sanda Klein, MD  05/13/2019 8:13 PM    [address], [place], [place]  [address] Phone: [phone]; Fax: [phone]

## 2019-05-14 ENCOUNTER — Telehealth: Payer: Self-pay | Admitting: Podiatry

## 2019-05-14 ENCOUNTER — Other Ambulatory Visit: Payer: Self-pay | Admitting: Cardiology

## 2019-05-14 NOTE — Telephone Encounter (Signed)
Pt finished physical therapy earlier this week and did not seem to get any relief from the therapy. The patient would like to know what her next step is.  Please give patient a call.

## 2019-05-15 NOTE — Telephone Encounter (Signed)
I called pt and she states she is not getting any better and wanted to know what to do next. I told pt that we had not seen her since 03/12/2019 and Dr. March Rummage would like to evaluate again. Pt agreed and I transferred to schedulers.

## 2019-05-21 ENCOUNTER — Telehealth: Payer: Self-pay | Admitting: *Deleted

## 2019-05-21 ENCOUNTER — Ambulatory Visit: Payer: HMO | Admitting: Podiatry

## 2019-05-21 ENCOUNTER — Other Ambulatory Visit: Payer: Self-pay

## 2019-05-21 DIAGNOSIS — R269 Unspecified abnormalities of gait and mobility: Secondary | ICD-10-CM | POA: Diagnosis not present

## 2019-05-21 DIAGNOSIS — S86311S Strain of muscle(s) and tendon(s) of peroneal muscle group at lower leg level, right leg, sequela: Secondary | ICD-10-CM | POA: Diagnosis not present

## 2019-05-21 DIAGNOSIS — R600 Localized edema: Secondary | ICD-10-CM

## 2019-05-21 DIAGNOSIS — M25571 Pain in right ankle and joints of right foot: Secondary | ICD-10-CM

## 2019-05-21 DIAGNOSIS — Z01812 Encounter for preprocedural laboratory examination: Secondary | ICD-10-CM

## 2019-05-21 DIAGNOSIS — S99911D Unspecified injury of right ankle, subsequent encounter: Secondary | ICD-10-CM

## 2019-05-21 NOTE — Telephone Encounter (Signed)
"  Dr. March Rummage is going to do surgery on my ankle next Wednesday.  He told me to call them and I called them and they said would have to have a location with you all.  It's Dr Collie Siad or Dr. Ina Kick, whatever.  I would like to know this afternoon what I need to do.  He said you have to send it over to them."  (I need the paperwork please.)

## 2019-05-21 NOTE — Patient Instructions (Signed)
Pre-Operative Instructions  Congratulations, you have decided to take an important step towards improving your quality of life.  You can be assured that the doctors and staff at [facility] will be with you every step of the way.  Here are some important things you should know:  1. Plan to be at the surgery center/hospital at least 1 (one) hour prior to your scheduled time, unless otherwise directed by the surgical center/hospital staff.  You must have a responsible adult accompany you, remain during the surgery and drive you home.  Make sure you have directions to the surgical center/hospital to ensure you arrive on time. 2. If you are having surgery at [facility] or  hospitals, you will need a copy of your medical history and physical form from your family physician within one month prior to the date of surgery. We will give you a form for your primary physician to complete.  3. We make every effort to accommodate the date you request for surgery.  However, there are times where surgery dates or times have to be moved.  We will contact you as soon as possible if a change in schedule is required.   4. No aspirin/ibuprofen for one week before surgery.  If you are on aspirin, any non-steroidal anti-inflammatory medications (Mobic, Aleve, Ibuprofen) should not be taken seven (7) days prior to your surgery.  You make take Tylenol for pain prior to surgery.  5. Medications - If you are taking daily heart and blood pressure medications, seizure, reflux, allergy, asthma, anxiety, pain or diabetes medications, make sure you notify the surgery center/hospital before the day of surgery so they can tell you which medications you should take or avoid the day of surgery. 6. No food or drink after midnight the night before surgery unless directed otherwise by surgical center/hospital staff. 7. No alcoholic beverages 24-hours prior to surgery.  No smoking 24-hours prior or 24-hours after  surgery. 8. Wear loose pants or shorts. They should be loose enough to fit over bandages, boots, and casts. 9. Don't wear slip-on shoes. Sneakers are preferred. 10. Bring your boot with you to the surgery center/hospital.  Also bring crutches or a walker if your physician has prescribed it for you.  If you do not have this equipment, it will be provided for you after surgery. 11. If you have not been contacted by the surgery center/hospital by the day before your surgery, call to confirm the date and time of your surgery. 12. Leave-time from work may vary depending on the type of surgery you have.  Appropriate arrangements should be made prior to surgery with your employer. 13. Prescriptions will be provided immediately following surgery by your doctor.  Fill these as soon as possible after surgery and take the medication as directed. Pain medications will not be refilled on weekends and must be approved by the doctor. 14. Remove nail polish on the operative foot and avoid getting pedicures prior to surgery. 15. Wash the night before surgery.  The night before surgery wash the foot and leg well with water and the antibacterial soap provided. Be sure to pay special attention to beneath the toenails and in between the toes.  Wash for at least three (3) minutes. Rinse thoroughly with water and dry well with a towel.  Perform this wash unless told not to do so by your physician.  Enclosed: 1 Ice pack (please put in freezer the night before surgery)   1 Hibiclens skin cleaner     Pre-op instructions  If you have any questions regarding the instructions, please do not hesitate to call our office.  [place]: [address], [place], [name] [address] -- [phone]  [place]: [address] [place], [place] [address] -- [phone]  [place]: [address]  [place], [place] [address] -- [phone]  [place]: [address], [address], [place], [place] [address] -- [phone]  Website:  [email or web address] 

## 2019-05-22 ENCOUNTER — Telehealth: Payer: Self-pay | Admitting: *Deleted

## 2019-05-22 ENCOUNTER — Encounter (HOSPITAL_COMMUNITY): Payer: Self-pay | Admitting: *Deleted

## 2019-05-22 ENCOUNTER — Other Ambulatory Visit: Payer: Self-pay

## 2019-05-22 DIAGNOSIS — I5032 Chronic diastolic (congestive) heart failure: Secondary | ICD-10-CM | POA: Diagnosis not present

## 2019-05-22 DIAGNOSIS — N390 Urinary tract infection, site not specified: Secondary | ICD-10-CM | POA: Diagnosis not present

## 2019-05-22 DIAGNOSIS — I10 Essential (primary) hypertension: Secondary | ICD-10-CM | POA: Diagnosis not present

## 2019-05-22 DIAGNOSIS — Z01818 Encounter for other preprocedural examination: Secondary | ICD-10-CM | POA: Diagnosis not present

## 2019-05-22 NOTE — Telephone Encounter (Signed)
I'm returning your call.  We have already received the paperwork back from your doctor.  Your surgery is being done at [facility], not at [facility].  You need to have a Covid test done.  Let me give you the phone number so you can call and schedule that appointment.  It has to be done three to four days before your surgery date.  The phone number to call is [phone].  "Where do I go for that?"  You go to the old [facility] on [address].  It's a drive thru, get in the far right lane when you go.  "I'll take care of it.  Thanks so much."

## 2019-05-22 NOTE — Progress Notes (Signed)
05-13-19 (Epic) -EKG and LOV w/ Dr. Sallyanne Kuster  05-22-19 Surgical clearance on chart from Dr. Ashby Dawes.

## 2019-05-22 NOTE — Telephone Encounter (Signed)
Noted thanks °

## 2019-05-22 NOTE — Telephone Encounter (Signed)
DOS 05/27/2019 REPAIR OF PERONEAL TENDON - [id number]  VS. REPAIR OF TENDON - [id number] RT. ANKLE  HEALTH TEAM ADVANTAGE: Effective Date - 09/17/2018 Benefits  Deductible - $0 Co-pay $225 to facility Co-insurance - 80% / [id number]  Mary  - Precertification is NOT REQUIRED Ref. # [id number]  Prior Auth. - I spoke to [place]. Pre-certification is NOT REQUIRED Ref. # [id number]

## 2019-05-23 ENCOUNTER — Other Ambulatory Visit (HOSPITAL_COMMUNITY)
Admission: RE | Admit: 2019-05-23 | Discharge: 2019-05-23 | Disposition: A | Discharge status: Home / Self Care | Payer: HMO | Source: Ambulatory Visit | Attending: Podiatry | Admitting: Podiatry

## 2019-05-23 DIAGNOSIS — Z20828 Contact with and (suspected) exposure to other viral communicable diseases: Secondary | ICD-10-CM | POA: Diagnosis not present

## 2019-05-23 DIAGNOSIS — Z01812 Encounter for preprocedural laboratory examination: Secondary | ICD-10-CM | POA: Insufficient documentation

## 2019-05-24 LAB — NOVEL CORONAVIRUS, NAA (HOSP ORDER, SEND-OUT TO REF LAB; TAT 18-24 HRS): SARS-CoV-2, NAA: NOT DETECTED

## 2019-05-24 NOTE — Progress Notes (Signed)
Subjective:  Patient ID: Jill Preston, female    DOB: 1933/04/07,  MRN: [id number]  Chief Complaint  Patient presents with  . Tendonitis    Pt states right foot tendinitis is about the same as last visit. Pt states physical therapy is currently not effective due to the amount of swelling.    85 y.o. female presents with the above complaint.  History above confirmed with patient.  Review of Systems: Negative except as noted in the HPI. Denies N/V/F/Ch.  Past Medical History:  Diagnosis Date  . Arthritis    right knee  . Cancer [facility]) yrs ago   skin cancer removed from face  . Chronic diastolic CHF (congestive heart failure) ([place])   . Chronic venous insufficiency    LEA VENOUS, 10/17/2011 - mild reflux in bilateral common femoral veins  . CKD (chronic kidney disease), stage III ([place])   . Coronary artery disease    a. s/p PCI/BMS to prox LAD and balloon angioplasty to mLAD with suboptimal result in 2000. b. Abnl nuc 2012, cath 09/2011 - showed that the overall territory of potential ischemia was small and attributable to a moderately diseased small second diagonal artery. Med rx.  Marland Kitchen Dyspnea    with activity  . Headache   . History of blood transfusion 2017  . Hyperlipidemia   . Hypertension   . Hypertensive heart disease   . Hypothyroidism   . Obesity   . Pneumonia    several times  . Stroke [facility])    pt. states she had "light stroke" in sept. 1980  . Urinary incontinence     Current Outpatient Medications:  .  amLODipine (NORVASC) 2.5 MG tablet, Take 1 tablet (2.5 mg total) by mouth daily. (Patient not taking: Reported on 05/22/2019), Disp: 180 tablet, Rfl: 3 .  aspirin EC 81 MG tablet, Take 1 tablet (81 mg total) by mouth daily., Disp: 90 tablet, Rfl: 3 .  bisacodyl (DULCOLAX) 5 MG EC tablet, Take 10 mg by mouth daily. , Disp: , Rfl:  .  clobetasol ointment (TEMOVATE) AB-123456789 %, Apply 1 application topically daily as needed (rash). , Disp: , Rfl:  .  ezetimibe (ZETIA) 10 MG  tablet, Take 1 tablet (10 mg total) by mouth daily., Disp: 90 tablet, Rfl: 3 .  furosemide (LASIX) 80 MG tablet, Take 80 mg at 6 am and 80 mg at 1 pm (Patient taking differently: Take 80 mg by mouth 2 (two) times daily as needed for fluid. ), Disp: 180 tablet, Rfl: 3 .  isosorbide mononitrate (IMDUR) 30 MG 24 hr tablet, TAKE 2 TABLETS BY MOUTH TWICE DAILY. (Patient taking differently: Take 60 mg by mouth daily. ), Disp: 360 tablet, Rfl: 1 .  KLOR-CON M20 20 MEQ tablet, Take 2 tablets (40 mEq total) by mouth daily. (Patient taking differently: Take 20 mEq by mouth daily. ), Disp: 180 tablet, Rfl: 3 .  levothyroxine (SYNTHROID, LEVOTHROID) 112 MCG tablet, Take 112 mcg by mouth daily before breakfast. , Disp: , Rfl: 4 .  losartan-hydrochlorothiazide (HYZAAR) 100-12.5 MG tablet, TAKE 1 TABLET BY MOUTH EVERY DAY (Patient not taking: Reported on 05/22/2019), Disp: 90 tablet, Rfl: 2 .  metolazone (ZAROXOLYN) 2.5 MG tablet, Take 1 tablet (2.5 mg total) by mouth as needed. Take 2.5 mg 30 min before AM dose of Lasix (only take when instructed to do so by Dr. Sallyanne Kuster) (Patient taking differently: Take 2.5 mg by mouth daily as needed (weight over 218 lbs). ), Disp: 30 tablet, Rfl: 6 .  Multiple Vitamin (MULTIVITAMIN WITH MINERALS) TABS tablet, Take 2 tablets by mouth daily. Centrum 50+ , Disp: , Rfl:  .  nitroGLYCERIN (NITROSTAT) 0.4 MG SL tablet, Place 1 tablet (0.4 mg total) under the tongue every 5 (five) minutes as needed for chest pain., Disp: 25 tablet, Rfl: 2 .  nystatin (MYCOSTATIN/NYSTOP) powder, Apply 1 Bottle topically daily as needed (rash). , Disp: , Rfl:  .  oxyCODONE (OXY IR/ROXICODONE) 5 MG immediate release tablet, Take 1 tablet (5 mg total) by mouth every 3 (three) hours as needed for moderate pain ((score 4 to 6)). (Patient not taking: Reported on 05/22/2019), Disp: 30 tablet, Rfl: 0 .  pravastatin (PRAVACHOL) 80 MG tablet, Take 80 mg by mouth at bedtime., Disp: , Rfl:  .  vitamin C (ASCORBIC ACID)  500 MG tablet, Take 1,000 mg by mouth daily. , Disp: , Rfl:  .  Cholecalciferol (VITAMIN D) 125 MCG (5000 UT) CAPS, Take 5,000 Units by mouth daily., Disp: , Rfl:   Social History   Tobacco Use  Smoking Status Never Smoker  Smokeless Tobacco Never Used    Allergies  Allergen Reactions  . Atorvastatin Anaphylaxis    Patient does not remember; caused pneumonia- took her off of it per patient.   . Penicillins Anaphylaxis and Hives    PATIENT HAS HAD A PCN REACTION WITH IMMEDIATE RASH, FACIAL/TONGUE/THROAT SWELLING, SOB, OR LIGHTHEADEDNESS WITH HYPOTENSION:  #  #  #  YES  #  #  #   HAS PT DEVELOPED SEVERE RASH INVOLVING MUCUS MEMBRANES or SKIN NECROSIS: #  #  #  YES  #  #  #   PATIENT HAS HAD A PCN REACTION THAT REQUIRED HOSPITALIZATION:  #  #  #  YES  #  #  #  Has patient had a PCN reaction occurring within the last 10 years:#  #  #  YES  #  #  #    . Shellfish Allergy Anaphylaxis and Nausea And Vomiting    Severe nausea and vomiting  . Aminophylline Itching, Swelling and Rash    Pt experienced burning urination, itching, redness/rash, and swelling of genitals after given this medication via IV push.  . Iodinated Diagnostic Agents Swelling and Other (See Comments)    SWELLING REACTION UNSPECIFIED  fFLUSHING  . Latex Other (See Comments)    Causes blisters  . Oxybutynin Chloride Other (See Comments)    "blisters"  . Vancomycin Hives    06/02/2018- Hives arm, back and chest  . Adhesive [Tape] Rash    Paper tape is ok  . Betadine [Povidone Iodine] Rash  . Ciprofloxacin Hives  . Codeine Nausea And Vomiting    .  Marland Kitchen Ranolazine Other (See Comments)    Constipation  .   Objective:   There were no vitals filed for this visit. There is no height or weight on file to calculate BMI. Constitutional Well developed. Well nourished.  Vascular Dorsalis pedis pulses palpable bilaterally. Posterior tibial pulses palpable bilaterally. Capillary refill normal to all digits.  No cyanosis  or clubbing noted. Pedal hair growth normal.  Neurologic Normal speech. Oriented to person, place, and time. Epicritic sensation to light touch grossly present bilaterally.  Dermatologic Nails well groomed and normal in appearance. No open wounds. No skin lesions.  Orthopedic: POP R Ankle peroneal tendons POP R lateral ankle Local edema R lateral ankle No pain medial ankle   Radiographs: None today Assessment:   1. Tear of peroneal tendon, right, sequela  2. Gait disturbance   3. Localized edema   4. Acute right ankle pain   5. Injury of right ankle, subsequent encounter    Plan:  Patient was evaluated and treated and all questions answered.  R Peroneal Tendon Tear -MRI reviewed with patient.  -Discussed repeat surgical repair.  Discussed with patient whether or not she would be able to undergo surgical correction patient states that she could and would like to consider..  We will plan for surgical repair in the coming weeks.  Needs PCP cardiac clearance prior to surgery. -Patient has failed all conservative therapy and wishes to proceed with surgical intervention. All risks, benefits, and alternatives discussed with patient. No guarantees given. Consent reviewed and signed by patient. -Planned procedures: Right ankle repair of peroneal tendon(s).   No follow-ups on file.

## 2019-05-24 NOTE — H&P (View-Only) (Signed)
Subjective:  Patient ID: Jill Preston, female    DOB: May 08, 1933,  MRN: [id number]  Chief Complaint  Patient presents with  . Tendonitis    Pt states right foot tendinitis is about the same as last visit. Pt states physical therapy is currently not effective due to the amount of swelling.    85 y.o. female presents with the above complaint.  History above confirmed with patient.  Review of Systems: Negative except as noted in the HPI. Denies N/V/F/Ch.  Past Medical History:  Diagnosis Date  . Arthritis    right knee  . Cancer [facility]) yrs ago   skin cancer removed from face  . Chronic diastolic CHF (congestive heart failure) ([place])   . Chronic venous insufficiency    LEA VENOUS, 10/17/2011 - mild reflux in bilateral common femoral veins  . CKD (chronic kidney disease), stage III ([place])   . Coronary artery disease    a. s/p PCI/BMS to prox LAD and balloon angioplasty to mLAD with suboptimal result in 2000. b. Abnl nuc 2012, cath 09/2011 - showed that the overall territory of potential ischemia was small and attributable to a moderately diseased small second diagonal artery. Med rx.  Marland Kitchen Dyspnea    with activity  . Headache   . History of blood transfusion 2017  . Hyperlipidemia   . Hypertension   . Hypertensive heart disease   . Hypothyroidism   . Obesity   . Pneumonia    several times  . Stroke [facility])    pt. states she had "light stroke" in sept. 1980  . Urinary incontinence     Current Outpatient Medications:  .  amLODipine (NORVASC) 2.5 MG tablet, Take 1 tablet (2.5 mg total) by mouth daily. (Patient not taking: Reported on 05/22/2019), Disp: 180 tablet, Rfl: 3 .  aspirin EC 81 MG tablet, Take 1 tablet (81 mg total) by mouth daily., Disp: 90 tablet, Rfl: 3 .  bisacodyl (DULCOLAX) 5 MG EC tablet, Take 10 mg by mouth daily. , Disp: , Rfl:  .  clobetasol ointment (TEMOVATE) AB-123456789 %, Apply 1 application topically daily as needed (rash). , Disp: , Rfl:  .  ezetimibe (ZETIA) 10 MG  tablet, Take 1 tablet (10 mg total) by mouth daily., Disp: 90 tablet, Rfl: 3 .  furosemide (LASIX) 80 MG tablet, Take 80 mg at 6 am and 80 mg at 1 pm (Patient taking differently: Take 80 mg by mouth 2 (two) times daily as needed for fluid. ), Disp: 180 tablet, Rfl: 3 .  isosorbide mononitrate (IMDUR) 30 MG 24 hr tablet, TAKE 2 TABLETS BY MOUTH TWICE DAILY. (Patient taking differently: Take 60 mg by mouth daily. ), Disp: 360 tablet, Rfl: 1 .  KLOR-CON M20 20 MEQ tablet, Take 2 tablets (40 mEq total) by mouth daily. (Patient taking differently: Take 20 mEq by mouth daily. ), Disp: 180 tablet, Rfl: 3 .  levothyroxine (SYNTHROID, LEVOTHROID) 112 MCG tablet, Take 112 mcg by mouth daily before breakfast. , Disp: , Rfl: 4 .  losartan-hydrochlorothiazide (HYZAAR) 100-12.5 MG tablet, TAKE 1 TABLET BY MOUTH EVERY DAY (Patient not taking: Reported on 05/22/2019), Disp: 90 tablet, Rfl: 2 .  metolazone (ZAROXOLYN) 2.5 MG tablet, Take 1 tablet (2.5 mg total) by mouth as needed. Take 2.5 mg 30 min before AM dose of Lasix (only take when instructed to do so by Dr. Sallyanne Kuster) (Patient taking differently: Take 2.5 mg by mouth daily as needed (weight over 218 lbs). ), Disp: 30 tablet, Rfl: 6 .  Multiple Vitamin (MULTIVITAMIN WITH MINERALS) TABS tablet, Take 2 tablets by mouth daily. Centrum 50+ , Disp: , Rfl:  .  nitroGLYCERIN (NITROSTAT) 0.4 MG SL tablet, Place 1 tablet (0.4 mg total) under the tongue every 5 (five) minutes as needed for chest pain., Disp: 25 tablet, Rfl: 2 .  nystatin (MYCOSTATIN/NYSTOP) powder, Apply 1 Bottle topically daily as needed (rash). , Disp: , Rfl:  .  oxyCODONE (OXY IR/ROXICODONE) 5 MG immediate release tablet, Take 1 tablet (5 mg total) by mouth every 3 (three) hours as needed for moderate pain ((score 4 to 6)). (Patient not taking: Reported on 05/22/2019), Disp: 30 tablet, Rfl: 0 .  pravastatin (PRAVACHOL) 80 MG tablet, Take 80 mg by mouth at bedtime., Disp: , Rfl:  .  vitamin C (ASCORBIC ACID)  500 MG tablet, Take 1,000 mg by mouth daily. , Disp: , Rfl:  .  Cholecalciferol (VITAMIN D) 125 MCG (5000 UT) CAPS, Take 5,000 Units by mouth daily., Disp: , Rfl:   Social History   Tobacco Use  Smoking Status Never Smoker  Smokeless Tobacco Never Used    Allergies  Allergen Reactions  . Atorvastatin Anaphylaxis    Patient does not remember; caused pneumonia- took her off of it per patient.   . Penicillins Anaphylaxis and Hives    PATIENT HAS HAD A PCN REACTION WITH IMMEDIATE RASH, FACIAL/TONGUE/THROAT SWELLING, SOB, OR LIGHTHEADEDNESS WITH HYPOTENSION:  #  #  #  YES  #  #  #   HAS PT DEVELOPED SEVERE RASH INVOLVING MUCUS MEMBRANES or SKIN NECROSIS: #  #  #  YES  #  #  #   PATIENT HAS HAD A PCN REACTION THAT REQUIRED HOSPITALIZATION:  #  #  #  YES  #  #  #  Has patient had a PCN reaction occurring within the last 10 years:#  #  #  YES  #  #  #    . Shellfish Allergy Anaphylaxis and Nausea And Vomiting    Severe nausea and vomiting  . Aminophylline Itching, Swelling and Rash    Pt experienced burning urination, itching, redness/rash, and swelling of genitals after given this medication via IV push.  . Iodinated Diagnostic Agents Swelling and Other (See Comments)    SWELLING REACTION UNSPECIFIED  fFLUSHING  . Latex Other (See Comments)    Causes blisters  . Oxybutynin Chloride Other (See Comments)    "blisters"  . Vancomycin Hives    06/02/2018- Hives arm, back and chest  . Adhesive [Tape] Rash    Paper tape is ok  . Betadine [Povidone Iodine] Rash  . Ciprofloxacin Hives  . Codeine Nausea And Vomiting    .  Marland Kitchen Ranolazine Other (See Comments)    Constipation  .   Objective:   There were no vitals filed for this visit. There is no height or weight on file to calculate BMI. Constitutional Well developed. Well nourished.  Vascular Dorsalis pedis pulses palpable bilaterally. Posterior tibial pulses palpable bilaterally. Capillary refill normal to all digits.  No cyanosis  or clubbing noted. Pedal hair growth normal.  Neurologic Normal speech. Oriented to person, place, and time. Epicritic sensation to light touch grossly present bilaterally.  Dermatologic Nails well groomed and normal in appearance. No open wounds. No skin lesions.  Orthopedic: POP R Ankle peroneal tendons POP R lateral ankle Local edema R lateral ankle No pain medial ankle   Radiographs: None today Assessment:   1. Tear of peroneal tendon, right, sequela  2. Gait disturbance   3. Localized edema   4. Acute right ankle pain   5. Injury of right ankle, subsequent encounter    Plan:  Patient was evaluated and treated and all questions answered.  R Peroneal Tendon Tear -MRI reviewed with patient.  -Discussed repeat surgical repair.  Discussed with patient whether or not she would be able to undergo surgical correction patient states that she could and would like to consider..  We will plan for surgical repair in the coming weeks.  Needs PCP cardiac clearance prior to surgery. -Patient has failed all conservative therapy and wishes to proceed with surgical intervention. All risks, benefits, and alternatives discussed with patient. No guarantees given. Consent reviewed and signed by patient. -Planned procedures: Right ankle repair of peroneal tendon(s).   No follow-ups on file.

## 2019-05-26 NOTE — Progress Notes (Signed)
Jill Preston made aware to arrive at 6:00AM 05/27/2019. She verbalized understanding.

## 2019-05-26 NOTE — Progress Notes (Signed)
Anesthesia Chart Review   Case: [id number] Date/Time: 05/27/19 0845   Procedure: PERONEAL TENDON REPAIR VERSES REPAIR TENDON (Right )   Anesthesia type: Regional   Pre-op diagnosis: TORN TENDON   Location: [place] [facility] / [facility]   Surgeon: [name], DPM      DISCUSSION:85 y.o. never smoker with h/o HLD, CAD, HTN, chronic diastolic CHF, hypothyroidism, CKD Stage III, Stroke, h/o lower back surgery with hardware in place L4-L5, torn peroneal tendon scheduled for above procedure 05/27/2019 with Dr. Hardie Pulley.   Last seen by cardiologist, Dr. Sanda Klein, 05/13/2019.  Per OV note, pt close to euvolemic status, dyspnea attributable to obesity and deconditioning, no angina. He also states, "Blood pressure should always be checked in her right arm since she appears to have left subclavian artery stenosis."  6 month follow up recommended.   Anticipate pt can proceed with planned procedure barring acute status change and after evaluation DOS (SDW).  VS: There were no vitals taken for this visit.  PROVIDERS: Merrilee Seashore, MD is PCP surgical clearance on chart  Sanda Klein, MD is Cardiologist  LABS: SDW (all labs ordered are listed, but only abnormal results are displayed)  Labs Reviewed - No data to display   IMAGES:   EKG: 05/13/2019 Rate 76 bpm Normal sinus rhythm   CV: Cardiac Cath 04/05/2017  LM lesion, 70 %stenosed.  Mid LAD lesion, 0 %stenosed at site of prior stent.  Ost Cx lesion, 60 %stenosed.  Prox RCA-1 lesion, 20 %stenosed.  Prox RCA-2 lesion, 30 %stenosed.  The left ventricular systolic function is normal.  LV end diastolic pressure is normal.  The left ventricular ejection fraction is 55-65% by visual estimate.   1. 70% eccentric distal left main stenosis. Angiographically similar to 2016. FFR 0.89 into the LAD and 0.86 into the LCx suggesting that this lesion is not flow limiting.  2. Otherwise nonobstructive CAD. Patent stent in LAD. 3.  Normal LV function 4. Normal LVEDP  Myocardial Perfusion 11/20/2016  The left ventricular ejection fraction is normal (55-65%).  Nuclear stress EF: 62%.  T wave inversion of 4 mm was noted during stress in the II, III, aVF, V6, V5 and V4 leads. T wave inversion persisted.  There was no ST segment deviation noted during stress.  This is a low risk study.   No reversible ischemia. RV uptake noted which could suggest elevated PA pressure. LVEF 62% with normal wall motion. This is a low risk study. Past Medical History:  Diagnosis Date  . Arthritis    right knee  . Cancer [facility]) yrs ago   skin cancer removed from face  . Chronic diastolic CHF (congestive heart failure) ([place])   . Chronic venous insufficiency    LEA VENOUS, 10/17/2011 - mild reflux in bilateral common femoral veins  . CKD (chronic kidney disease), stage III ([place])   . Coronary artery disease    a. s/p PCI/BMS to prox LAD and balloon angioplasty to mLAD with suboptimal result in 2000. b. Abnl nuc 2012, cath 09/2011 - showed that the overall territory of potential ischemia was small and attributable to a moderately diseased small second diagonal artery. Med rx.  Marland Kitchen Dyspnea    with activity  . Headache   . History of blood transfusion 2017  . Hyperlipidemia   . Hypertension   . Hypertensive heart disease   . Hypothyroidism   . Obesity   . Pneumonia    several times  . Stroke [facility])  pt. states she had "light stroke" in sept. 1980  . Urinary incontinence     Past Surgical History:  Procedure Laterality Date  . ABDOMINAL HYSTERECTOMY  1970's   complete  . ANKLE ARTHROSCOPY Right 07/10/2017   Procedure: ANKLE ARTHROSCOPY;  Surgeon: [name], DPM;  Location: [place];  Service: Podiatry;  Laterality: Right;  . BACK SURGERY  07/26/2016   cervical neck surgery, [facility]  . BLADDER SURGERY  2010   WITH MESH  bladder tach  . bunion removal surgery Bilateral 15 yrs ago  . CARDIAC CATHETERIZATION  Left 09/25/2011   Medical management  . CARDIAC CATHETERIZATION Left 03/25/2001   Normal LV function, LAD residual narrowing of less than 10%, normal ramus intermediate, circumflex, and RCA,   . CARDIAC CATHETERIZATION  09/04/1999   LAD, 3x30mm Tetra stent resulting in a reduction of the 80% stenosis to 0% residual  . CARDIAC CATHETERIZATION N/A 01/26/2015   Procedure: Right/Left Heart Cath and Coronary Angiography;  Surgeon: Troy Sine, MD;  Location: [place] CV LAB;  Service: Cardiovascular;  Laterality: N/A;  . CARDIAC CATHETERIZATION N/A 01/27/2015   Procedure: Intravascular Pressure Wire/FFR Study;  Surgeon: Burnell Blanks, MD;  Location: [place] CV LAB;  Service: Cardiovascular;  Laterality: N/A;  . CARDIAC CATHETERIZATION N/A 01/27/2015   Procedure: Right Heart Cath;  Surgeon: Burnell Blanks, MD;  Location: [place] CV LAB;  Service: Cardiovascular;  Laterality: N/A;  . CHOLECYSTECTOMY    . COLONOSCOPY WITH PROPOFOL N/A 03/07/2017   Procedure: COLONOSCOPY WITH PROPOFOL;  Surgeon: Juanita Craver, MD;  [facility];  Service: Endoscopy;  Laterality: N/A;  . CORONARY STENT PLACEMENT     LAD x 1  . ganglion cyst removal  yrs ago   x 2  . ILIAC VEIN ANGIOPLASTY / STENTING  02/15/2015  . INTRAVASCULAR PRESSURE WIRE/FFR STUDY N/A 04/05/2017   Procedure: Intravascular Pressure Wire/FFR Study;  Surgeon: Martinique, Peter M, MD;  Location: [place] CV LAB;  Service: Cardiovascular;  Laterality: N/A;  . IVC FILTER INSERTION  2016  . IVC FILTER REMOVAL  02/15/2015   at [facility]  . LEFT HEART CATH AND CORONARY ANGIOGRAPHY N/A 04/05/2017   Procedure: Left Heart Cath and Coronary Angiography;  Surgeon: Martinique, Peter M, MD;  Location: [place] CV LAB;  Service: Cardiovascular;  Laterality: N/A;  . LEFT HEART CATHETERIZATION WITH CORONARY ANGIOGRAM N/A 09/25/2011   Procedure: LEFT HEART CATHETERIZATION WITH CORONARY ANGIOGRAM;  Surgeon: Sanda Klein, MD;  Location: [place] CATH  LAB;  Service: Cardiovascular;  Laterality: N/A;  . multiple bladder surgeries to remove mesh    . ROTATOR CUFF REPAIR Right   . stent to groin Left 08/2014   left leg  . TENDON REPAIR Right 07/10/2017   Procedure: RIGHT PERONEAL TENDON REPAIR;  Surgeon: [name], DPM;  Location: [place];  Service: Podiatry;  Laterality: Right;    MEDICATIONS: No current facility-administered medications for this encounter.    Marland Kitchen aspirin EC 81 MG tablet  . bisacodyl (DULCOLAX) 5 MG EC tablet  . Cholecalciferol (VITAMIN D) 125 MCG (5000 UT) CAPS  . clobetasol ointment (TEMOVATE) 0.05 %  . ezetimibe (ZETIA) 10 MG tablet  . furosemide (LASIX) 80 MG tablet  . isosorbide mononitrate (IMDUR) 30 MG 24 hr tablet  . KLOR-CON M20 20 MEQ tablet  . levothyroxine (SYNTHROID, LEVOTHROID) 112 MCG tablet  . metolazone (ZAROXOLYN) 2.5 MG tablet  . Multiple Vitamin (MULTIVITAMIN WITH MINERALS) TABS tablet  . nitroGLYCERIN (NITROSTAT)  0.4 MG SL tablet  . nystatin (MYCOSTATIN/NYSTOP) powder  . pravastatin (PRAVACHOL) 80 MG tablet  . vitamin C (ASCORBIC ACID) 500 MG tablet  . amLODipine (NORVASC) 2.5 MG tablet  . losartan-hydrochlorothiazide (HYZAAR) 100-12.5 MG tablet  . oxyCODONE (OXY IR/ROXICODONE) 5 MG immediate release tablet     [facility] Pre-Surgical Testing [phone] 05/26/19 4:02 PM

## 2019-05-27 ENCOUNTER — Ambulatory Visit (HOSPITAL_COMMUNITY): Payer: HMO | Admitting: Physician Assistant

## 2019-05-27 ENCOUNTER — Ambulatory Visit (HOSPITAL_COMMUNITY)
Admission: RE | Admit: 2019-05-27 | Discharge: 2019-05-27 | Disposition: A | Discharge status: Home / Self Care | Payer: HMO | Attending: Podiatry | Admitting: Podiatry

## 2019-05-27 ENCOUNTER — Encounter (HOSPITAL_COMMUNITY)
Admission: RE | Disposition: A | Discharge status: Home / Self Care | Payer: Self-pay | Source: Home / Self Care | Attending: Podiatry

## 2019-05-27 ENCOUNTER — Encounter (HOSPITAL_COMMUNITY): Payer: Self-pay | Admitting: *Deleted

## 2019-05-27 DIAGNOSIS — Z79899 Other long term (current) drug therapy: Secondary | ICD-10-CM | POA: Insufficient documentation

## 2019-05-27 DIAGNOSIS — I2510 Atherosclerotic heart disease of native coronary artery without angina pectoris: Secondary | ICD-10-CM | POA: Insufficient documentation

## 2019-05-27 DIAGNOSIS — I25119 Atherosclerotic heart disease of native coronary artery with unspecified angina pectoris: Secondary | ICD-10-CM | POA: Diagnosis not present

## 2019-05-27 DIAGNOSIS — I5032 Chronic diastolic (congestive) heart failure: Secondary | ICD-10-CM | POA: Insufficient documentation

## 2019-05-27 DIAGNOSIS — Z85828 Personal history of other malignant neoplasm of skin: Secondary | ICD-10-CM | POA: Diagnosis not present

## 2019-05-27 DIAGNOSIS — G8918 Other acute postprocedural pain: Secondary | ICD-10-CM | POA: Diagnosis not present

## 2019-05-27 DIAGNOSIS — I130 Hypertensive heart and chronic kidney disease with heart failure and stage 1 through stage 4 chronic kidney disease, or unspecified chronic kidney disease: Secondary | ICD-10-CM | POA: Diagnosis not present

## 2019-05-27 DIAGNOSIS — N183 Chronic kidney disease, stage 3 (moderate): Secondary | ICD-10-CM | POA: Insufficient documentation

## 2019-05-27 DIAGNOSIS — Z7982 Long term (current) use of aspirin: Secondary | ICD-10-CM | POA: Diagnosis not present

## 2019-05-27 DIAGNOSIS — S86311A Strain of muscle(s) and tendon(s) of peroneal muscle group at lower leg level, right leg, initial encounter: Secondary | ICD-10-CM | POA: Insufficient documentation

## 2019-05-27 DIAGNOSIS — S86311S Strain of muscle(s) and tendon(s) of peroneal muscle group at lower leg level, right leg, sequela: Secondary | ICD-10-CM

## 2019-05-27 DIAGNOSIS — Z8673 Personal history of transient ischemic attack (TIA), and cerebral infarction without residual deficits: Secondary | ICD-10-CM | POA: Insufficient documentation

## 2019-05-27 DIAGNOSIS — I739 Peripheral vascular disease, unspecified: Secondary | ICD-10-CM | POA: Diagnosis not present

## 2019-05-27 DIAGNOSIS — Z9861 Coronary angioplasty status: Secondary | ICD-10-CM | POA: Diagnosis not present

## 2019-05-27 DIAGNOSIS — E039 Hypothyroidism, unspecified: Secondary | ICD-10-CM | POA: Diagnosis not present

## 2019-05-27 DIAGNOSIS — X58XXXA Exposure to other specified factors, initial encounter: Secondary | ICD-10-CM | POA: Insufficient documentation

## 2019-05-27 DIAGNOSIS — I872 Venous insufficiency (chronic) (peripheral): Secondary | ICD-10-CM | POA: Insufficient documentation

## 2019-05-27 DIAGNOSIS — E785 Hyperlipidemia, unspecified: Secondary | ICD-10-CM | POA: Insufficient documentation

## 2019-05-27 DIAGNOSIS — Z79890 Hormone replacement therapy (postmenopausal): Secondary | ICD-10-CM | POA: Diagnosis not present

## 2019-05-27 HISTORY — PX: TENDON REPAIR: SHX5111

## 2019-05-27 LAB — POCT I-STAT 4, (NA,K, GLUC, HGB,HCT)
Glucose, Bld: 105 mg/dL — ABNORMAL HIGH (ref 70–99)
HCT: 44 % (ref 36.0–46.0)
Hemoglobin: 15 g/dL (ref 12.0–15.0)
Potassium: 3.4 mmol/L — ABNORMAL LOW (ref 3.5–5.1)
Sodium: 139 mmol/L (ref 135–145)

## 2019-05-27 LAB — CBC
HCT: 46.6 % — ABNORMAL HIGH (ref 36.0–46.0)
Hemoglobin: 15.3 g/dL — ABNORMAL HIGH (ref 12.0–15.0)
MCH: 32.3 pg (ref 26.0–34.0)
MCHC: 32.8 g/dL (ref 30.0–36.0)
MCV: 98.5 fL (ref 80.0–100.0)
Platelets: 145 10*3/uL — ABNORMAL LOW (ref 150–400)
RBC: 4.73 MIL/uL (ref 3.87–5.11)
RDW: 12.7 % (ref 11.5–15.5)
WBC: 5 10*3/uL (ref 4.0–10.5)
nRBC: 0 % (ref 0.0–0.2)

## 2019-05-27 LAB — BASIC METABOLIC PANEL
Anion gap: 12 (ref 5–15)
BUN: 29 mg/dL — ABNORMAL HIGH (ref 8–23)
CO2: 29 mmol/L (ref 22–32)
Calcium: 9.1 mg/dL (ref 8.9–10.3)
Chloride: 97 mmol/L — ABNORMAL LOW (ref 98–111)
Creatinine, Ser: 1.01 mg/dL — ABNORMAL HIGH (ref 0.44–1.00)
GFR calc Af Amer: 60 mL/min — ABNORMAL LOW (ref >60)
GFR calc non Af Amer: 51 mL/min — ABNORMAL LOW (ref >60)
Glucose, Bld: 118 mg/dL — ABNORMAL HIGH (ref 70–99)
Potassium: 2.7 mmol/L — CL (ref 3.5–5.1)
Sodium: 138 mmol/L (ref 135–145)

## 2019-05-27 SURGERY — TENDON REPAIR
Anesthesia: Monitor Anesthesia Care | Site: Ankle | Laterality: Right

## 2019-05-27 MED ORDER — ROPIVACAINE HCL 5 MG/ML IJ SOLN
INTRAMUSCULAR | Status: DC | PRN
  Administered 2019-05-27: 40 mL via PERINEURAL

## 2019-05-27 MED ORDER — EPHEDRINE SULFATE-NACL 50-0.9 MG/10ML-% IV SOSY
PREFILLED_SYRINGE | INTRAVENOUS | Status: DC | PRN
  Administered 2019-05-27 (×3): 10 mg via INTRAVENOUS

## 2019-05-27 MED ORDER — CLINDAMYCIN HCL 300 MG PO CAPS: 300.0000 mg | ORAL_CAPSULE | Freq: Two times a day (BID) | ORAL | 0 refills | Status: DC

## 2019-05-27 MED ORDER — DEXAMETHASONE SODIUM PHOSPHATE 10 MG/ML IJ SOLN
INTRAMUSCULAR | Status: DC | PRN
  Administered 2019-05-27: 5 mg

## 2019-05-27 MED ORDER — DEXAMETHASONE SODIUM PHOSPHATE 10 MG/ML IJ SOLN
INTRAMUSCULAR | Status: DC | PRN
  Administered 2019-05-27: 10 mg

## 2019-05-27 MED ORDER — OXYCODONE HCL 5 MG/5ML PO SOLN: 5.0000 mg | Freq: Once | ORAL | Status: DC | PRN

## 2019-05-27 MED ORDER — FENTANYL CITRATE (PF) 100 MCG/2ML IJ SOLN: 25.0000 ug | INTRAMUSCULAR | Status: DC | PRN

## 2019-05-27 MED ORDER — LACTATED RINGERS IV SOLN
INTRAVENOUS | Status: DC
  Administered 2019-05-27 (×2): via INTRAVENOUS

## 2019-05-27 MED ORDER — LIDOCAINE 2% (20 MG/ML) 5 ML SYRINGE
INTRAMUSCULAR | Status: AC
  Filled 2019-05-27: qty 5

## 2019-05-27 MED ORDER — BUPIVACAINE HCL (PF) 0.5 % IJ SOLN
INTRAMUSCULAR | Status: DC | PRN
  Administered 2019-05-27: 1 mL

## 2019-05-27 MED ORDER — PROPOFOL 10 MG/ML IV BOLUS
INTRAVENOUS | Status: DC | PRN
  Administered 2019-05-27: 100 mg via INTRAVENOUS
  Administered 2019-05-27: 40 mg via INTRAVENOUS

## 2019-05-27 MED ORDER — BUPIVACAINE HCL (PF) 0.5 % IJ SOLN
INTRAMUSCULAR | Status: AC
  Filled 2019-05-27: qty 30

## 2019-05-27 MED ORDER — CLONIDINE HCL (ANALGESIA) 100 MCG/ML EP SOLN
EPIDURAL | Status: DC | PRN
  Administered 2019-05-27: 50 ug

## 2019-05-27 MED ORDER — OXYCODONE-ACETAMINOPHEN 10-325 MG PO TABS: 1.0000 | ORAL_TABLET | ORAL | 0 refills | Status: DC | PRN

## 2019-05-27 MED ORDER — PROPOFOL 10 MG/ML IV BOLUS
INTRAVENOUS | Status: AC
  Filled 2019-05-27: qty 20

## 2019-05-27 MED ORDER — POTASSIUM CHLORIDE 10 MEQ/100ML IV SOLN
10.0000 meq | INTRAVENOUS | Status: AC
  Filled 2019-05-27 (×2): qty 100

## 2019-05-27 MED ORDER — ONDANSETRON HCL 4 MG/2ML IJ SOLN
INTRAMUSCULAR | Status: AC
  Filled 2019-05-27: qty 2

## 2019-05-27 MED ORDER — MIDAZOLAM HCL 2 MG/2ML IJ SOLN: 1.0000 mg | Freq: Once | INTRAMUSCULAR | Status: DC

## 2019-05-27 MED ORDER — ONDANSETRON HCL 4 MG/2ML IJ SOLN
INTRAMUSCULAR | Status: DC | PRN
  Administered 2019-05-27: 4 mg via INTRAVENOUS

## 2019-05-27 MED ORDER — PROPOFOL 500 MG/50ML IV EMUL
INTRAVENOUS | Status: DC | PRN
  Administered 2019-05-27: 50 ug/kg/min via INTRAVENOUS

## 2019-05-27 MED ORDER — CLINDAMYCIN PHOSPHATE 900 MG/50ML IV SOLN
900.0000 mg | INTRAVENOUS | Status: AC
  Administered 2019-05-27: 900 mg via INTRAVENOUS
  Filled 2019-05-27: qty 50

## 2019-05-27 MED ORDER — FENTANYL CITRATE (PF) 100 MCG/2ML IJ SOLN
50.0000 ug | Freq: Once | INTRAMUSCULAR | Status: AC
  Administered 2019-05-27 (×3): 50 ug via INTRAVENOUS
  Filled 2019-05-27: qty 2

## 2019-05-27 MED ORDER — ONDANSETRON HCL 4 MG/2ML IJ SOLN: 4.0000 mg | Freq: Once | INTRAMUSCULAR | Status: DC | PRN

## 2019-05-27 MED ORDER — DIPHENHYDRAMINE HCL 50 MG/ML IJ SOLN
INTRAMUSCULAR | Status: AC
  Filled 2019-05-27: qty 1

## 2019-05-27 MED ORDER — OXYCODONE HCL 5 MG PO TABS: 5.0000 mg | ORAL_TABLET | Freq: Once | ORAL | Status: DC | PRN

## 2019-05-27 MED ORDER — FENTANYL CITRATE (PF) 100 MCG/2ML IJ SOLN
INTRAMUSCULAR | Status: AC
  Filled 2019-05-27: qty 2

## 2019-05-27 MED ORDER — LIDOCAINE 2% (20 MG/ML) 5 ML SYRINGE
INTRAMUSCULAR | Status: DC | PRN
  Administered 2019-05-27: 50 mg via INTRAVENOUS

## 2019-05-27 MED ORDER — 0.9 % SODIUM CHLORIDE (POUR BTL) OPTIME
TOPICAL | Status: DC | PRN
  Administered 2019-05-27: 1000 mL

## 2019-05-27 SURGICAL SUPPLY — 48 items
BLADE HEX COATED 2.75 (ELECTRODE) ×2 IMPLANT
BLADE MINI RND TIP GREEN BEAV (BLADE) IMPLANT
BLADE SURG 15 STRL LF DISP TIS (BLADE) ×1 IMPLANT
BLADE SURG 15 STRL SS (BLADE) ×2
BNDG ELASTIC 3X5.8 VLCR STR LF (GAUZE/BANDAGES/DRESSINGS) IMPLANT
BNDG ELASTIC 4X5.8 VLCR STR LF (GAUZE/BANDAGES/DRESSINGS) ×2 IMPLANT
BNDG ESMARK 4X9 LF (GAUZE/BANDAGES/DRESSINGS) ×2 IMPLANT
BNDG GAUZE ELAST 4 BULKY (GAUZE/BANDAGES/DRESSINGS) ×2 IMPLANT
CHLORAPREP W/TINT 26 (MISCELLANEOUS) ×2 IMPLANT
COVER BACK TABLE 60X90IN (DRAPES) ×2 IMPLANT
COVER WAND RF STERILE (DRAPES) IMPLANT
DRAPE SHEET LG 3/4 BI-LAMINATE (DRAPES) ×4 IMPLANT
DRSG PAD ABDOMINAL 8X10 ST (GAUZE/BANDAGES/DRESSINGS) IMPLANT
ELECT PENCIL ROCKER SW 15FT (MISCELLANEOUS) ×2 IMPLANT
ELECT REM PT RETURN 15FT ADLT (MISCELLANEOUS) ×2 IMPLANT
GAUZE 4X4 16PLY RFD (DISPOSABLE) IMPLANT
GAUZE SPONGE 4X4 12PLY STRL (GAUZE/BANDAGES/DRESSINGS) ×2 IMPLANT
GAUZE XEROFORM 1X8 LF (GAUZE/BANDAGES/DRESSINGS) ×2 IMPLANT
GLOVE BIO SURGEON STRL SZ7.5 (GLOVE) ×2 IMPLANT
GLOVE BIOGEL PI IND STRL 8 (GLOVE) ×1 IMPLANT
GLOVE BIOGEL PI INDICATOR 8 (GLOVE) ×1
GOWN STRL REUS W/ TWL LRG LVL3 (GOWN DISPOSABLE) ×1 IMPLANT
GOWN STRL REUS W/TWL LRG LVL3 (GOWN DISPOSABLE) ×2
GOWN STRL REUS W/TWL XL LVL3 (GOWN DISPOSABLE) ×2 IMPLANT
KIT BASIN OR (CUSTOM PROCEDURE TRAY) ×2 IMPLANT
KIT TURNOVER KIT A (KITS) IMPLANT
NEEDLE HYPO 25X1 1.5 SAFETY (NEEDLE) ×2 IMPLANT
NS IRRIG 1000ML POUR BTL (IV SOLUTION) ×2 IMPLANT
PATCH TISSUE MEND 5X6CM (Orthopedic Implant) ×2 IMPLANT
SLEEVE SCD COMPRESS KNEE MED (MISCELLANEOUS) ×2 IMPLANT
STOCKINETTE 4X48 STRL (DRAPES) IMPLANT
STOCKINETTE 6  STRL (DRAPES) ×2
STOCKINETTE 6 STRL (DRAPES) ×1 IMPLANT
SUT ETHILON 4 0 PS 2 18 (SUTURE) ×2 IMPLANT
SUT MERSILENE 4 0 P 3 (SUTURE) ×4 IMPLANT
SUT SILK 2 0 (SUTURE) ×2
SUT SILK 2-0 18XBRD TIE 12 (SUTURE) ×1 IMPLANT
SUT VIC AB 2-0 SH 27 (SUTURE)
SUT VIC AB 2-0 SH 27XBRD (SUTURE) IMPLANT
SUT VIC AB 3-0 PS2 18 (SUTURE) ×4
SUT VIC AB 3-0 PS2 18XBRD (SUTURE) ×2 IMPLANT
SUT VIC AB 4-0 PS2 18 (SUTURE) ×2 IMPLANT
SUT VICRYL 4-0 PS2 18IN ABS (SUTURE) IMPLANT
SYR BULB 3OZ (MISCELLANEOUS) ×2 IMPLANT
SYR CONTROL 10ML LL (SYRINGE) ×2 IMPLANT
TOWEL OR 17X26 10 PK STRL BLUE (TOWEL DISPOSABLE) ×2 IMPLANT
UNDERPAD 30X36 HEAVY ABSORB (UNDERPADS AND DIAPERS) ×2 IMPLANT
YANKAUER SUCT BULB TIP NO VENT (SUCTIONS) ×2 IMPLANT

## 2019-05-27 NOTE — Transfer of Care (Signed)
Immediate Anesthesia Transfer of Care Note  Patient: Jill Preston  Procedure(s) Performed: PERONEAL TENDON REPAIR x2 (Right Ankle)  Patient Location: PACU  Anesthesia Type:General  Level of Consciousness: drowsy, patient cooperative and responds to stimulation  Airway & Oxygen Therapy: Patient Spontanous Breathing and Patient connected to face mask oxygen  Post-op Assessment: Report given to RN and Post -op Vital signs reviewed and stable  Post vital signs: Reviewed and stable  Last Vitals:  Vitals Value Taken Time  BP 143/64 05/27/19 1112  Temp    Pulse 61 05/27/19 1113  Resp    SpO2 98 % 05/27/19 1113  Vitals shown include unvalidated device data.  Last Pain:  Vitals:   05/27/19 0750  TempSrc: Oral  PainSc: 3       Patients Stated Pain Goal: 4 [id number] [phone])  Complications: No apparent anesthesia complications

## 2019-05-27 NOTE — Anesthesia Procedure Notes (Signed)
Anesthesia Regional Block: Popliteal block   Pre-Anesthetic Checklist: ,, timeout performed, Correct Patient, Correct Site, Correct Laterality, Correct Procedure, Correct Position, site marked, Risks and benefits discussed,  Surgical consent,  Pre-op evaluation,  At surgeon's request and post-op pain management  Laterality: Right  Prep: chloraprep       Needles:  Injection technique: Single-shot  Needle Type: Echogenic Stimulator Needle     Needle Length: 10cm  Needle Gauge: 21     Additional Needles:   Procedures:,,,, ultrasound used (permanent image in chart),,,,  Narrative:  Start time: 05/27/2019 7:30 AM End time: 05/27/2019 7:37 AM Injection made incrementally with aspirations every 5 mL.  Performed by: Personally  Anesthesiologist: Lidia Collum, MD  Additional Notes: Monitors applied. Injection made in 5cc increments. No resistance to injection. Good needle visualization. Patient tolerated procedure well.

## 2019-05-27 NOTE — Anesthesia Procedure Notes (Signed)
Procedure Name: LMA Insertion Date/Time: 05/27/2019 9:45 AM Performed by: Lieutenant Diego, CRNA Pre-anesthesia Checklist: Patient identified, Emergency Drugs available, Suction available and Patient being monitored Patient Re-evaluated:Patient Re-evaluated prior to induction Oxygen Delivery Method: Circle system utilized Preoxygenation: Pre-oxygenation with 100% oxygen Induction Type: IV induction Ventilation: Mask ventilation without difficulty LMA: LMA with gastric port inserted LMA Size: 4.0 Number of attempts: 1 Placement Confirmation: positive ETCO2 and breath sounds checked- equal and bilateral Tube secured with: Tape Dental Injury: Teeth and Oropharynx as per pre-operative assessment

## 2019-05-27 NOTE — Anesthesia Preprocedure Evaluation (Addendum)
Anesthesia Evaluation  Patient identified by MRN, date of birth, ID band Patient awake    Reviewed: Allergy & Precautions, NPO status , Patient's Chart, lab work & pertinent test results  History of Anesthesia Complications Negative for: history of anesthetic complications  Airway Mallampati: I  TM Distance: >3 FB Neck ROM: Full    Dental  (+) Edentulous Upper, Edentulous Lower   Pulmonary neg pulmonary ROS,    Pulmonary exam normal        Cardiovascular hypertension, Pt. on medications + CAD, + Cardiac Stents, + Peripheral Vascular Disease and +CHF  Normal cardiovascular exam     Neuro/Psych CVA negative psych ROS   GI/Hepatic negative GI ROS, Neg liver ROS,   Endo/Other  Hypothyroidism   Renal/GU Renal InsufficiencyRenal disease  negative genitourinary   Musculoskeletal negative musculoskeletal ROS (+)   Abdominal   Peds  Hematology negative hematology ROS (+)   Anesthesia Other Findings Left subclavian stenosis- check BP in right arm  LHC 2018:  1. 70% eccentric distal left main stenosis. Angiographically similar to 2016. FFR 0.89 into the LAD and 0.86 into the LCx suggesting that this lesion is not flow limiting.  2. Otherwise nonobstructive CAD. Patent stent in LAD. 3. Normal LV function 4. Normal LVEDP  MPS 2018:  No reversible ischemia. RV uptake noted which could suggest elevated PA pressure. LVEF 62% with normal wall motion. This is a low risk study.  Reproductive/Obstetrics                           Anesthesia Physical Anesthesia Plan  ASA: III  Anesthesia Plan: MAC and Regional   Post-op Pain Management:  Regional for Post-op pain   Induction: Intravenous  PONV Risk Score and Plan: 2 and Propofol infusion, TIVA and Treatment may vary due to age or medical condition  Airway Management Planned: Natural Airway, Nasal Cannula and Simple Face Mask  Additional Equipment:  None  Intra-op Plan:   Post-operative Plan:   Informed Consent: I have reviewed the patients History and Physical, chart, labs and discussed the procedure including the risks, benefits and alternatives for the proposed anesthesia with the patient or authorized representative who has indicated his/her understanding and acceptance.       Plan Discussed with:   Anesthesia Plan Comments:       Anesthesia Quick Evaluation

## 2019-05-27 NOTE — Progress Notes (Signed)
Notified that patient's potassium was 2.7. Patient had already received her block. Discussed that due to her cardiac history it would be best to postpone surgery in setting of low potassium. Discussed rechecking potassium prior to decision.  Recheck Potassium was 3.4. Discussed with patient proceeding with surgery. Patient agreed and we will proceed.

## 2019-05-27 NOTE — Anesthesia Postprocedure Evaluation (Signed)
Anesthesia Post Note  Patient: Jill Preston  Procedure(s) Performed: PERONEAL TENDON REPAIR x2 (Right Ankle)     Patient location during evaluation: PACU Anesthesia Type: Regional and General Level of consciousness: awake and alert Pain management: pain level controlled Vital Signs Assessment: post-procedure vital signs reviewed and stable Respiratory status: spontaneous breathing, nonlabored ventilation and respiratory function stable Cardiovascular status: blood pressure returned to baseline and stable Postop Assessment: no apparent nausea or vomiting Anesthetic complications: no    Last Vitals:  Vitals:   05/27/19 1131 05/27/19 1145  BP:  (!) 128/56  Pulse:  62  Resp:  16  Temp:    SpO2: 98% 92%    Last Pain:  Vitals:   05/27/19 1145  TempSrc:   PainSc: 0-No pain                 Lidia Collum

## 2019-05-27 NOTE — Op Note (Signed)
Patient Name: Jill Preston DOB: [birth year]  MRN: [id number]   Date of Service: 05/27/2019  Surgeon: Dr. Hardie Pulley, DPM Assistants: Dr. Boneta Lucks, DPM Pre-operative Diagnosis:  Peroneus longus tendon tear Post-operative Diagnosis:  Same, plus peroneus brevis tendon tear Procedures:  1) Secondary repair of peroneus longus tendon tear  2) Secondary repair of peroneus brevis tendon tear  3) Injection of tendon sheath Pathology/Specimens: * No specimens in log * Anesthesia: Regional, MAC Hemostasis:  Total Tourniquet Time Documented: Thigh (Right) - 90 minutes Total: Thigh (Right) - 90 minutes  Estimated Blood Loss: 0 mL Materials:  Implant Name Type Inv. Item Serial No. Manufacturer Lot No. LRB No. Used Action  [facility] TISSUE MEND 5X6CM - [id number] [facility] TISSUE MEND 5X6CM  [employer] [id number] Right 1 Implanted   Medications: none Complications: none  Indications for Procedure:  This is a 85 y.o. female with chronic right ankle pain.  She previously underwent a peroneal tendon repair on her right side and did very well for about a year.  She recently reinjured the right ankle and has had pains.  She has failed conservative therapy and presents for elective repair.  All risk benefits and alternatives of surgery discussed the patient no guarantees given.  Patient stated she can do a period of nonweightbearing post procedure.  Also of note patient's potassium was thought to be low prior to surgery and her procedure was potentially going to be canceled due to cardiac history.  This was rechecked and found to be essentially normal.  We discussed with patient proceeding with surgery and patient elected to proceed.   Procedure in Detail: Patient was identified in pre-operative holding area. Formal consent was signed and the right lower extremity was marked. Patient was brought back to the operating room. Anesthesia was induced. The extremity was prepped and  draped in the usual sterile fashion. Timeout was taken to confirm patient name, laterality, and procedure prior to incision.   Attention was then directed to the right ankle.  Incision was made over the area of the patient's previous incision.  This was carried down to the through skin and subcu tissue with care to avoid all vital neurovascular structures all bleeders were cut as electrocautery.  There was significant fat in this area and some scar tissue.  Dissection was carried down to level of the peroneal tendons.  The peroneal tendon sheath was carefully incised.  The peroneal tendons were then inspected.  Attention was first directed to the proximal longus tendon.  The tendon did appear rather bulbous there was a split thickness tear noted.  The peroneus brevis was flattened with additional central split thickness tear.  Of note this was not noted on the MRI.  The peroneus brevis was repaired first.  The central aspect was debulked and primarily repaired with 4-0 [place]il[place] with care taken to tubularize the tendon with the repair.  The tendon that appeared more normal in appearance.  Attention was then directed to the previous longus.  The tendon was then repaired in similar manner with care taken to tubularize the tendon.   Tissue mend was then wrapped around each tendon and primarily sutured to act as a tendon wrap and to prevent tendon adhesions.  The peroneal tendon sheath was then repaired with 3-0 Vicryl. An injection consisting of 10 mg dexamethasone and 1 mL of Marcaine 0.5% plain was injected to the tendon sheath.  The foot was then dressed with Xeroform fluff for Kerlix Ace bandage web  roll and additional Ace bandage. Patient tolerated the procedure well.   Disposition: Following a period of post-operative monitoring, patient will be transferred home.

## 2019-05-27 NOTE — Progress Notes (Signed)
Assisted Dr Christella Hartigan with right, ultrasound guided, popliteal and saphenous blocks. Side rails up, monitors on throughout procedure. See vital signs in flow sheet. Tolerated Procedure well.

## 2019-05-27 NOTE — Brief Op Note (Signed)
05/27/2019  11:06 AM  PATIENT:  Jill Preston  85 y.o. female  PRE-OPERATIVE DIAGNOSIS:  TORN TENDON  POST-OPERATIVE DIAGNOSIS:  TORN TENDON  PROCEDURE:  Procedure(s): PERONEAL TENDON REPAIR x2 (Right)  Injection of tendon sheath  SURGEON:  Surgeon(s) and Role:    * [name], DPM - Primary  Boneta Lucks, DPM - Assisting  PHYSICIAN ASSISTANT:    ASSISTANTS: none   ANESTHESIA:   regional and general  EBL:  25  BLOOD ADMINISTERED:none  DRAINS: none   LOCAL MEDICATIONS USED:  MARCAINE    and Amount: 1 ml  SPECIMEN:  No Specimen  DISPOSITION OF SPECIMEN:  N/A  COUNTS:  YES  TOURNIQUET:   Total Tourniquet Time Documented: Thigh (Right) - 90 minutes Total: Thigh (Right) - 90 minutes   DICTATION: .Viviann Spare Dictation  PLAN OF CARE: Discharge to home after PACU  PATIENT DISPOSITION:  PACU - hemodynamically stable.   Delay start of Pharmacological VTE agent (>24hrs) due to surgical blood loss or risk of bleeding: not applicable

## 2019-05-27 NOTE — Interval H&P Note (Signed)
History and Physical Interval Note:  05/27/2019 8:32 AM  Jill Preston  has presented today for surgery, with the diagnosis of TORN TENDON.  The various methods of treatment have been discussed with the patient and family. After consideration of risks, benefits and other options for treatment, the patient has consented to  Procedure(s): [place] (Right) as a surgical intervention.  The patient's history has been reviewed, patient examined, no change in status, stable for surgery.  I have reviewed the patient's chart and labs.  Questions were answered to the patient's satisfaction.     [name]

## 2019-05-27 NOTE — Anesthesia Procedure Notes (Signed)
Anesthesia Regional Block: Adductor canal block   Pre-Anesthetic Checklist: ,, timeout performed, Correct Patient, Correct Site, Correct Laterality, Correct Procedure, Correct Position, site marked, Risks and benefits discussed,  Surgical consent,  Pre-op evaluation,  At surgeon's request and post-op pain management  Laterality: Right  Prep: chloraprep       Needles:  Injection technique: Single-shot  Needle Type: Echogenic Stimulator Needle     Needle Length: 10cm  Needle Gauge: 21     Additional Needles:   Procedures:,,,, ultrasound used (permanent image in chart),,,,  Narrative:  Start time: 05/27/2019 7:30 AM End time: 05/27/2019 7:37 AM Injection made incrementally with aspirations every 5 mL.  Performed by: Personally  Anesthesiologist: Lidia Collum, MD  Additional Notes: Monitors applied. Injection made in 5cc increments. No resistance to injection. Good needle visualization. Patient tolerated procedure well.

## 2019-05-28 ENCOUNTER — Telehealth: Payer: Self-pay

## 2019-05-28 ENCOUNTER — Telehealth: Payer: Self-pay | Admitting: *Deleted

## 2019-05-28 ENCOUNTER — Encounter (HOSPITAL_COMMUNITY): Payer: Self-pay | Admitting: Podiatry

## 2019-05-28 NOTE — Telephone Encounter (Signed)
I called pt and she states she's been up to the bedside commode 3 times and kind of slipped to the floor, and her husband tried to help and he fell and the she fell on him, but she had a soft landing. Pt states she is still having the numbness in the surgery foot and has not taken any oxycodone. Pt states she was given 3 medications yesterday. I reviewed Dr. Eleanora Neighbor orders and he prescribed percocet and cleocin. Pt spelled the medication she was speaking of nitrofurantoin 100mg . I told pt I did not see that antibiotic in our list of her medications and to call the pharmacy. I encouraged pt to use the walker for balance if taking the pain medication. Pt states she is using a wheelchair also. Pt states she is doing well.

## 2019-05-28 NOTE — Telephone Encounter (Signed)
POST OP CALL-    1) General condition stated by the patient:   2) Is the pt having pain?   3) Pain score:   4) Has the pt taken Rx'd pain medication, regularly or PRN?   5) Is the pain medication giving relief?  6) Any fever, chills, nausea, or vomiting, shortness of breath or tightness in calf?  7) Is the bandage clean, dry and intact?  8) Is there excessive tightness, bleeding or drainage coming through the bandage?  9) Did you understand all of the post op instruction sheet given?  10) Any questions or concerns regarding post op care/recovery?    Confirmed POV appointment with patient    Pt did not answer phone

## 2019-05-28 NOTE — Telephone Encounter (Signed)
Noted thank you

## 2019-05-28 NOTE — Telephone Encounter (Signed)
Pt called twice and states she missed two calls from our office.

## 2019-06-05 ENCOUNTER — Ambulatory Visit (INDEPENDENT_AMBULATORY_CARE_PROVIDER_SITE_OTHER): Payer: HMO | Admitting: Podiatry

## 2019-06-05 ENCOUNTER — Telehealth: Payer: Self-pay | Admitting: Podiatry

## 2019-06-05 ENCOUNTER — Other Ambulatory Visit: Payer: Self-pay

## 2019-06-05 ENCOUNTER — Encounter: Payer: Self-pay | Admitting: Podiatry

## 2019-06-05 VITALS — BP 144/80 | HR 74 | Temp 97.5°F

## 2019-06-05 DIAGNOSIS — S86311S Strain of muscle(s) and tendon(s) of peroneal muscle group at lower leg level, right leg, sequela: Secondary | ICD-10-CM | POA: Diagnosis not present

## 2019-06-05 DIAGNOSIS — Z9889 Other specified postprocedural states: Secondary | ICD-10-CM

## 2019-06-05 MED ORDER — ENOXAPARIN SODIUM 30 MG/0.3ML ~~LOC~~ SOLN: 30.0000 mg | SUBCUTANEOUS | 0 refills | Status: DC

## 2019-06-05 NOTE — Addendum Note (Signed)
Addended by: Harriett Sine D on: 06/05/2019 12:51 PM   Modules accepted: Orders

## 2019-06-05 NOTE — Telephone Encounter (Signed)
I informed pt of the change.

## 2019-06-05 NOTE — Telephone Encounter (Signed)
Lovenox rx changed to [place].

## 2019-06-05 NOTE — Progress Notes (Signed)
Subjective:  Patient ID: Jill Preston, female    DOB: 22-Mar-1933,  MRN: [id number]  Chief Complaint  Patient presents with  . Routine Post Op     DOS 05/27/2019 REPAIR PERONEAL TENDON X 2 " my foot is feeling pretty good, it gets sore when I have it down so I try to keep it elevated"    DOS: 05/27/2019 Procedure: Right Peroneal tendon repair x2  85 y.o. female returns for post-op check. Doing well not having to take much pain medication  Review of Systems: Negative except as noted in the HPI. Denies N/V/F/Ch.  Past Medical History:  Diagnosis Date  . Arthritis    right knee  . Cancer [facility]) yrs ago   skin cancer removed from face  . Chronic diastolic CHF (congestive heart failure) ([place])   . Chronic venous insufficiency    LEA VENOUS, 10/17/2011 - mild reflux in bilateral common femoral veins  . CKD (chronic kidney disease), stage III ([place])   . Coronary artery disease    a. s/p PCI/BMS to prox LAD and balloon angioplasty to mLAD with suboptimal result in 2000. b. Abnl nuc 2012, cath 09/2011 - showed that the overall territory of potential ischemia was small and attributable to a moderately diseased small second diagonal artery. Med rx.  Marland Kitchen Dyspnea    with activity  . Headache   . History of blood transfusion 2017  . Hyperlipidemia   . Hypertension   . Hypertensive heart disease   . Hypothyroidism   . Obesity   . Pneumonia    several times  . Stroke [facility])    pt. states she had "light stroke" in sept. 1980  . Urinary incontinence     Current Outpatient Medications:  .  amLODipine (NORVASC) 2.5 MG tablet, Take 1 tablet (2.5 mg total) by mouth daily. (Patient not taking: Reported on 05/22/2019), Disp: 180 tablet, Rfl: 3 .  aspirin EC 81 MG tablet, Take 1 tablet (81 mg total) by mouth daily., Disp: 90 tablet, Rfl: 3 .  bisacodyl (DULCOLAX) 5 MG EC tablet, Take 10 mg by mouth daily. , Disp: , Rfl:  .  Cholecalciferol (VITAMIN D) 125 MCG (5000 UT) CAPS, Take 5,000 Units by mouth  daily., Disp: , Rfl:  .  clindamycin (CLEOCIN) 300 MG capsule, Take 1 capsule (300 mg total) by mouth 2 (two) times daily., Disp: 14 capsule, Rfl: 0 .  clobetasol ointment (TEMOVATE) [id number] %, Apply 1 application topically daily as needed (rash). , Disp: , Rfl:  .  enoxaparin (LOVENOX) 30 MG/0.3ML injection, Inject 0.3 mLs (30 mg total) into the skin daily., Disp: 8.4 mL, Rfl: 0 .  ezetimibe (ZETIA) 10 MG tablet, Take 1 tablet (10 mg total) by mouth daily., Disp: 90 tablet, Rfl: 3 .  furosemide (LASIX) 80 MG tablet, Take 80 mg at 6 am and 80 mg at 1 pm (Patient taking differently: Take 80 mg by mouth 2 (two) times daily as needed for fluid. ), Disp: 180 tablet, Rfl: 3 .  isosorbide mononitrate (IMDUR) 30 MG 24 hr tablet, TAKE 2 TABLETS BY MOUTH TWICE DAILY. (Patient taking differently: Take 60 mg by mouth daily. ), Disp: 360 tablet, Rfl: 1 .  KLOR-CON M20 20 MEQ tablet, Take 2 tablets (40 mEq total) by mouth daily. (Patient taking differently: Take 20 mEq by mouth daily. ), Disp: 180 tablet, Rfl: 3 .  levothyroxine (SYNTHROID, LEVOTHROID) 112 MCG tablet, Take 112 mcg by mouth daily before breakfast. , Disp: , Rfl: 4 .  losartan-hydrochlorothiazide (HYZAAR) 100-12.5 MG tablet, TAKE 1 TABLET BY MOUTH EVERY DAY (Patient not taking: Reported on 05/22/2019), Disp: 90 tablet, Rfl: 2 .  metolazone (ZAROXOLYN) 2.5 MG tablet, Take 1 tablet (2.5 mg total) by mouth as needed. Take 2.5 mg 30 min before AM dose of Lasix (only take when instructed to do so by Dr. Sallyanne Kuster) (Patient taking differently: Take 2.5 mg by mouth daily as needed (weight over 218 lbs). ), Disp: 30 tablet, Rfl: 6 .  Multiple Vitamin (MULTIVITAMIN WITH MINERALS) TABS tablet, Take 2 tablets by mouth daily. Centrum 50+ , Disp: , Rfl:  .  nitroGLYCERIN (NITROSTAT) 0.4 MG SL tablet, Place 1 tablet (0.4 mg total) under the tongue every 5 (five) minutes as needed for chest pain., Disp: 25 tablet, Rfl: 2 .  nystatin (MYCOSTATIN/NYSTOP) powder, Apply 1  Bottle topically daily as needed (rash). , Disp: , Rfl:  .  oxyCODONE-acetaminophen (PERCOCET) 10-325 MG tablet, Take 1 tablet by mouth every 4 (four) hours as needed for pain., Disp: 20 tablet, Rfl: 0 .  pravastatin (PRAVACHOL) 80 MG tablet, Take 80 mg by mouth at bedtime., Disp: , Rfl:  .  vitamin C (ASCORBIC ACID) 500 MG tablet, Take 1,000 mg by mouth daily. , Disp: , Rfl:   Social History   Tobacco Use  Smoking Status Never Smoker  Smokeless Tobacco Never Used    Allergies  Allergen Reactions  . Atorvastatin Anaphylaxis    Patient does not remember; caused pneumonia- took her off of it per patient.   . Penicillins Anaphylaxis and Hives    PATIENT HAS HAD A PCN REACTION WITH IMMEDIATE RASH, FACIAL/TONGUE/THROAT SWELLING, SOB, OR LIGHTHEADEDNESS WITH HYPOTENSION:  #  #  #  YES  #  #  #   HAS PT DEVELOPED SEVERE RASH INVOLVING MUCUS MEMBRANES or SKIN NECROSIS: #  #  #  YES  #  #  #   PATIENT HAS HAD A PCN REACTION THAT REQUIRED HOSPITALIZATION:  #  #  #  YES  #  #  #  Has patient had a PCN reaction occurring within the last 10 years:#  #  #  YES  #  #  #    . Shellfish Allergy Anaphylaxis and Nausea And Vomiting    Severe nausea and vomiting  . Aminophylline Itching, Swelling and Rash    Pt experienced burning urination, itching, redness/rash, and swelling of genitals after given this medication via IV push.  . Iodinated Diagnostic Agents Swelling and Other (See Comments)    SWELLING REACTION UNSPECIFIED  fFLUSHING  . Latex Other (See Comments)    Causes blisters  . Oxybutynin Chloride Other (See Comments)    "blisters"  . Vancomycin Hives    06/02/2018- Hives arm, back and chest  . Adhesive [Tape] Rash    Paper tape is ok  . Betadine [Povidone Iodine] Rash  . Ciprofloxacin Hives  . Codeine Nausea And Vomiting    .  Marland Kitchen Ranolazine Other (See Comments)    Constipation  .   Objective:   Vitals:   06/05/19 0922  BP: (!) 144/80  Pulse: 74  Temp: (!) 97.5 F (36.4 C)    There is no height or weight on file to calculate BMI. Constitutional Well developed. Well nourished.  Vascular Foot warm and well perfused. Capillary refill normal to all digits.   Neurologic Normal speech. Oriented to person, place, and time. Epicritic sensation to light touch grossly present bilaterally.  Dermatologic Skin healing well without signs  of infection. Skin edges well coapted without signs of infection.  Orthopedic: Tenderness to palpation noted about the surgical site.   Radiographs: None Assessment:   1. Tear of peroneal tendon, right, sequela   2. Post-operative state    Plan:  Patient was evaluated and treated and all questions answered.  S/p foot surgery right -Progressing as expected post-operatively. -XR: None -WB Status: NWB in posterior splint.  Applied today -Sutures: intact. -Medications: Rx lovenox for dvt ppx -Foot redressed.  Return in about 1 week (around 06/12/2019).

## 2019-06-05 NOTE — Telephone Encounter (Signed)
Pt was seen in office today and had a prescription sent in but it was sent to the incorrect pharmacy.  Please send the prescription, Lovenox to [facility] on [address].    Please remove [facility] from pts chart.

## 2019-06-08 ENCOUNTER — Other Ambulatory Visit: Payer: Self-pay | Admitting: Physician Assistant

## 2019-06-10 ENCOUNTER — Telehealth: Payer: Self-pay | Admitting: *Deleted

## 2019-06-10 NOTE — Telephone Encounter (Signed)
I told pt that the Lovenox was to prevent blood clots and was important for her to take. Pt states understanding, and she still has a pretty big bruise on her leg.

## 2019-06-10 NOTE — Telephone Encounter (Signed)
Noted thanks °

## 2019-06-10 NOTE — Telephone Encounter (Signed)
Pt states the medication Dr. March Rummage ordered 06/05/2019 has just been prepared and cost $90.00, is this medication necessary.

## 2019-06-12 ENCOUNTER — Ambulatory Visit (INDEPENDENT_AMBULATORY_CARE_PROVIDER_SITE_OTHER): Payer: HMO | Admitting: Podiatry

## 2019-06-12 ENCOUNTER — Encounter: Payer: Self-pay | Admitting: Podiatry

## 2019-06-12 ENCOUNTER — Other Ambulatory Visit: Payer: Self-pay

## 2019-06-12 DIAGNOSIS — Z9889 Other specified postprocedural states: Secondary | ICD-10-CM

## 2019-06-15 NOTE — Progress Notes (Signed)
Subjective:  Patient ID: Jill Preston, female    DOB: 30-Aug-1933,  MRN: [id number]  Chief Complaint  Patient presents with  . Routine Post Op    " my foot feels pretty good when it is elevated, but it throbs and swells when I have it down"    DOS: 05/27/2019 Procedure: Right Peroneal tendon repair x2  85 y.o. female returns for post-op check. Taking lovenox as directed. Hx above confirmed with patient.  Review of Systems: Negative except as noted in the HPI. Denies N/V/F/Ch.  Past Medical History:  Diagnosis Date  . Arthritis    right knee  . Cancer [facility]) yrs ago   skin cancer removed from face  . Chronic diastolic CHF (congestive heart failure) ([place])   . Chronic venous insufficiency    LEA VENOUS, 10/17/2011 - mild reflux in bilateral common femoral veins  . CKD (chronic kidney disease), stage III ([place])   . Coronary artery disease    a. s/p PCI/BMS to prox LAD and balloon angioplasty to mLAD with suboptimal result in 2000. b. Abnl nuc 2012, cath 09/2011 - showed that the overall territory of potential ischemia was small and attributable to a moderately diseased small second diagonal artery. Med rx.  Marland Kitchen Dyspnea    with activity  . Headache   . History of blood transfusion 2017  . Hyperlipidemia   . Hypertension   . Hypertensive heart disease   . Hypothyroidism   . Obesity   . Pneumonia    several times  . Stroke [facility])    pt. states she had "light stroke" in sept. 1980  . Urinary incontinence     Current Outpatient Medications:  .  amLODipine (NORVASC) 2.5 MG tablet, Take 1 tablet (2.5 mg total) by mouth daily. (Patient not taking: Reported on 05/22/2019), Disp: 180 tablet, Rfl: 3 .  aspirin EC 81 MG tablet, Take 1 tablet (81 mg total) by mouth daily., Disp: 90 tablet, Rfl: 3 .  bisacodyl (DULCOLAX) 5 MG EC tablet, Take 10 mg by mouth daily. , Disp: , Rfl:  .  Cholecalciferol (VITAMIN D) 125 MCG (5000 UT) CAPS, Take 5,000 Units by mouth daily., Disp: , Rfl:  .   clindamycin (CLEOCIN) 300 MG capsule, Take 1 capsule (300 mg total) by mouth 2 (two) times daily., Disp: 14 capsule, Rfl: 0 .  clobetasol ointment (TEMOVATE) AB-123456789 %, Apply 1 application topically daily as needed (rash). , Disp: , Rfl:  .  enoxaparin (LOVENOX) 30 MG/0.3ML injection, Inject 0.3 mLs (30 mg total) into the skin daily., Disp: 8.4 mL, Rfl: 0 .  ezetimibe (ZETIA) 10 MG tablet, Take 1 tablet (10 mg total) by mouth daily., Disp: 90 tablet, Rfl: 3 .  furosemide (LASIX) 80 MG tablet, Take 80 mg at 6 am and 80 mg at 1 pm (Patient taking differently: Take 80 mg by mouth 2 (two) times daily as needed for fluid. ), Disp: 180 tablet, Rfl: 3 .  isosorbide mononitrate (IMDUR) 30 MG 24 hr tablet, TAKE 2 TABLETS BY MOUTH TWICE DAILY. (Patient taking differently: Take 60 mg by mouth daily. ), Disp: 360 tablet, Rfl: 1 .  KLOR-CON M20 20 MEQ tablet, Take 2 tablets (40 mEq total) by mouth daily. (Patient taking differently: Take 20 mEq by mouth daily. ), Disp: 180 tablet, Rfl: 3 .  levothyroxine (SYNTHROID, LEVOTHROID) 112 MCG tablet, Take 112 mcg by mouth daily before breakfast. , Disp: , Rfl: 4 .  losartan-hydrochlorothiazide (HYZAAR) 100-12.5 MG tablet, TAKE 1 TABLET BY  MOUTH EVERY DAY (Patient not taking: Reported on 05/22/2019), Disp: 90 tablet, Rfl: 2 .  metolazone (ZAROXOLYN) 2.5 MG tablet, Take 1 tablet (2.5 mg total) by mouth as needed. Take 2.5 mg 30 min before AM dose of Lasix (only take when instructed to do so by Dr. Sallyanne Kuster) (Patient taking differently: Take 2.5 mg by mouth daily as needed (weight over 218 lbs). ), Disp: 30 tablet, Rfl: 6 .  Multiple Vitamin (MULTIVITAMIN WITH MINERALS) TABS tablet, Take 2 tablets by mouth daily. Centrum 50+ , Disp: , Rfl:  .  nitroGLYCERIN (NITROSTAT) 0.4 MG SL tablet, Place 1 tablet (0.4 mg total) under the tongue every 5 (five) minutes as needed for chest pain., Disp: 25 tablet, Rfl: 2 .  nystatin (MYCOSTATIN/NYSTOP) powder, Apply 1 Bottle topically daily as  needed (rash). , Disp: , Rfl:  .  oxyCODONE-acetaminophen (PERCOCET) 10-325 MG tablet, Take 1 tablet by mouth every 4 (four) hours as needed for pain., Disp: 20 tablet, Rfl: 0 .  pravastatin (PRAVACHOL) 80 MG tablet, Take 80 mg by mouth at bedtime., Disp: , Rfl:  .  vitamin C (ASCORBIC ACID) 500 MG tablet, Take 1,000 mg by mouth daily. , Disp: , Rfl:   Social History   Tobacco Use  Smoking Status Never Smoker  Smokeless Tobacco Never Used    Allergies  Allergen Reactions  . Atorvastatin Anaphylaxis    Patient does not remember; caused pneumonia- took her off of it per patient.   . Penicillins Anaphylaxis and Hives    PATIENT HAS HAD A PCN REACTION WITH IMMEDIATE RASH, FACIAL/TONGUE/THROAT SWELLING, SOB, OR LIGHTHEADEDNESS WITH HYPOTENSION:  #  #  #  YES  #  #  #   HAS PT DEVELOPED SEVERE RASH INVOLVING MUCUS MEMBRANES or SKIN NECROSIS: #  #  #  YES  #  #  #   PATIENT HAS HAD A PCN REACTION THAT REQUIRED HOSPITALIZATION:  #  #  #  YES  #  #  #  Has patient had a PCN reaction occurring within the last 10 years:#  #  #  YES  #  #  #    . Shellfish Allergy Anaphylaxis and Nausea And Vomiting    Severe nausea and vomiting  . Aminophylline Itching, Swelling and Rash    Pt experienced burning urination, itching, redness/rash, and swelling of genitals after given this medication via IV push.  . Iodinated Diagnostic Agents Swelling and Other (See Comments)    SWELLING REACTION UNSPECIFIED  fFLUSHING  . Latex Other (See Comments)    Causes blisters  . Oxybutynin Chloride Other (See Comments)    "blisters"  . Vancomycin Hives    06/02/2018- Hives arm, back and chest  . Adhesive [Tape] Rash    Paper tape is ok  . Betadine [Povidone Iodine] Rash  . Ciprofloxacin Hives  . Codeine Nausea And Vomiting    .  Marland Kitchen Ranolazine Other (See Comments)    Constipation  .   Objective:   There were no vitals filed for this visit. There is no height or weight on file to calculate BMI.  Constitutional Well developed. Well nourished.  Vascular Foot warm and well perfused. Capillary refill normal to all digits.   Neurologic Normal speech. Oriented to person, place, and time. Epicritic sensation to light touch grossly present bilaterally.  Dermatologic Skin healing well without signs of infection. Skin edges well coapted without signs of infection.  Orthopedic: Tenderness to palpation noted about the surgical site.  Radiographs: None Assessment:   1. Post-operative state    Plan:  Patient was evaluated and treated and all questions answered.  S/p foot surgery right -Progressing as expected post-operatively. -XR: None -WB Status: NWB in posterior splint. Splint cut today - was too long. -Sutures: intact. -Medications: Continue lovenox. -Foot redressed.  No follow-ups on file.

## 2019-06-19 ENCOUNTER — Ambulatory Visit (INDEPENDENT_AMBULATORY_CARE_PROVIDER_SITE_OTHER): Payer: HMO | Admitting: Podiatry

## 2019-06-19 ENCOUNTER — Other Ambulatory Visit: Payer: Self-pay

## 2019-06-19 DIAGNOSIS — Z9889 Other specified postprocedural states: Secondary | ICD-10-CM

## 2019-06-25 NOTE — Progress Notes (Signed)
Subjective:  Patient ID: Jill Preston, female    DOB: 13-Aug-1933,  MRN: [id number]  Chief Complaint  Patient presents with  . Routine Post Op    Pt states small spot that is painful on right dorsal foot. No other concerns. Denies fever/chills/nausea/vomiting. Pt states she is healing, although slowly.   DOS: 05/27/2019 Procedure: Right Peroneal tendon repair x2  85 y.o. female returns for post-op check. Doing well, new spot thinks from bandaging. Pain controlled.  Review of Systems: Negative except as noted in the HPI. Denies N/V/F/Ch.  Past Medical History:  Diagnosis Date  . Arthritis    right knee  . Cancer [facility]) yrs ago   skin cancer removed from face  . Chronic diastolic CHF (congestive heart failure) ([place])   . Chronic venous insufficiency    LEA VENOUS, 10/17/2011 - mild reflux in bilateral common femoral veins  . CKD (chronic kidney disease), stage III ([place])   . Coronary artery disease    a. s/p PCI/BMS to prox LAD and balloon angioplasty to mLAD with suboptimal result in 2000. b. Abnl nuc 2012, cath 09/2011 - showed that the overall territory of potential ischemia was small and attributable to a moderately diseased small second diagonal artery. Med rx.  Marland Kitchen Dyspnea    with activity  . Headache   . History of blood transfusion 2017  . Hyperlipidemia   . Hypertension   . Hypertensive heart disease   . Hypothyroidism   . Obesity   . Pneumonia    several times  . Stroke [facility])    pt. states she had "light stroke" in sept. 1980  . Urinary incontinence     Current Outpatient Medications:  .  amLODipine (NORVASC) 2.5 MG tablet, Take 1 tablet (2.5 mg total) by mouth daily., Disp: 180 tablet, Rfl: 3 .  aspirin EC 81 MG tablet, Take 1 tablet (81 mg total) by mouth daily., Disp: 90 tablet, Rfl: 3 .  bisacodyl (DULCOLAX) 5 MG EC tablet, Take 10 mg by mouth daily. , Disp: , Rfl:  .  Cholecalciferol (VITAMIN D) 125 MCG (5000 UT) CAPS, Take 5,000 Units by mouth daily., Disp: ,  Rfl:  .  clindamycin (CLEOCIN) 300 MG capsule, Take 1 capsule (300 mg total) by mouth 2 (two) times daily., Disp: 14 capsule, Rfl: 0 .  clobetasol ointment (TEMOVATE) AB-123456789 %, Apply 1 application topically daily as needed (rash). , Disp: , Rfl:  .  enoxaparin (LOVENOX) 30 MG/0.3ML injection, Inject 0.3 mLs (30 mg total) into the skin daily., Disp: 8.4 mL, Rfl: 0 .  ezetimibe (ZETIA) 10 MG tablet, Take 1 tablet (10 mg total) by mouth daily., Disp: 90 tablet, Rfl: 3 .  furosemide (LASIX) 80 MG tablet, Take 80 mg at 6 am and 80 mg at 1 pm (Patient taking differently: Take 80 mg by mouth 2 (two) times daily as needed for fluid. ), Disp: 180 tablet, Rfl: 3 .  isosorbide mononitrate (IMDUR) 30 MG 24 hr tablet, TAKE 2 TABLETS BY MOUTH TWICE DAILY. (Patient taking differently: Take 60 mg by mouth daily. ), Disp: 360 tablet, Rfl: 1 .  KLOR-CON M20 20 MEQ tablet, Take 2 tablets (40 mEq total) by mouth daily. (Patient taking differently: Take 20 mEq by mouth daily. ), Disp: 180 tablet, Rfl: 3 .  levothyroxine (SYNTHROID, LEVOTHROID) 112 MCG tablet, Take 112 mcg by mouth daily before breakfast. , Disp: , Rfl: 4 .  losartan-hydrochlorothiazide (HYZAAR) 100-12.5 MG tablet, TAKE 1 TABLET BY MOUTH EVERY DAY, Disp:  90 tablet, Rfl: 2 .  metolazone (ZAROXOLYN) 2.5 MG tablet, Take 1 tablet (2.5 mg total) by mouth as needed. Take 2.5 mg 30 min before AM dose of Lasix (only take when instructed to do so by Dr. Sallyanne Kuster) (Patient taking differently: Take 2.5 mg by mouth daily as needed (weight over 218 lbs). ), Disp: 30 tablet, Rfl: 6 .  Multiple Vitamin (MULTIVITAMIN WITH MINERALS) TABS tablet, Take 2 tablets by mouth daily. Centrum 50+ , Disp: , Rfl:  .  nitrofurantoin (MACRODANTIN) 100 MG capsule, TK ONE C PO  BID, Disp: , Rfl:  .  nitroGLYCERIN (NITROSTAT) 0.4 MG SL tablet, Place 1 tablet (0.4 mg total) under the tongue every 5 (five) minutes as needed for chest pain., Disp: 25 tablet, Rfl: 2 .  nystatin  (MYCOSTATIN/NYSTOP) powder, Apply 1 Bottle topically daily as needed (rash). , Disp: , Rfl:  .  oxyCODONE-acetaminophen (PERCOCET) 10-325 MG tablet, Take 1 tablet by mouth every 4 (four) hours as needed for pain., Disp: 20 tablet, Rfl: 0 .  Potassium Chloride ER 20 MEQ TBCR, , Disp: , Rfl:  .  pravastatin (PRAVACHOL) 80 MG tablet, Take 80 mg by mouth at bedtime., Disp: , Rfl:  .  vitamin C (ASCORBIC ACID) 500 MG tablet, Take 1,000 mg by mouth daily. , Disp: , Rfl:   Social History   Tobacco Use  Smoking Status Never Smoker  Smokeless Tobacco Never Used    Allergies  Allergen Reactions  . Atorvastatin Anaphylaxis    Patient does not remember; caused pneumonia- took her off of it per patient.   . Penicillins Anaphylaxis and Hives    PATIENT HAS HAD A PCN REACTION WITH IMMEDIATE RASH, FACIAL/TONGUE/THROAT SWELLING, SOB, OR LIGHTHEADEDNESS WITH HYPOTENSION:  #  #  #  YES  #  #  #   HAS PT DEVELOPED SEVERE RASH INVOLVING MUCUS MEMBRANES or SKIN NECROSIS: #  #  #  YES  #  #  #   PATIENT HAS HAD A PCN REACTION THAT REQUIRED HOSPITALIZATION:  #  #  #  YES  #  #  #  Has patient had a PCN reaction occurring within the last 10 years:#  #  #  YES  #  #  #    . Shellfish Allergy Anaphylaxis and Nausea And Vomiting    Severe nausea and vomiting  . Aminophylline Itching, Swelling and Rash    Pt experienced burning urination, itching, redness/rash, and swelling of genitals after given this medication via IV push.  . Iodinated Diagnostic Agents Swelling and Other (See Comments)    SWELLING REACTION UNSPECIFIED  fFLUSHING  . Latex Other (See Comments)    Causes blisters  . Oxybutynin Chloride Other (See Comments)    "blisters"  . Vancomycin Hives    06/02/2018- Hives arm, back and chest  . Adhesive [Tape] Rash    Paper tape is ok  . Betadine [Povidone Iodine] Rash  . Ciprofloxacin Hives  . Codeine Nausea And Vomiting    .  Marland Kitchen Ranolazine Other (See Comments)    Constipation  .   Objective:    There were no vitals filed for this visit. There is no height or weight on file to calculate BMI. Constitutional Well developed. Well nourished.  Vascular Foot warm and well perfused. Capillary refill normal to all digits.   Neurologic Normal speech. Oriented to person, place, and time. Epicritic sensation to light touch grossly present bilaterally.  Dermatologic Skin healing well without signs of infection.  Skin edges well coapted without signs of infection. Abrasion 2/2 bandaging.  Orthopedic: Tenderness to palpation noted about the surgical site.   Radiographs: None Assessment:   1. Post-operative state    Plan:  Patient was evaluated and treated and all questions answered.  S/p foot surgery right -Progressing as expected post-operatively. -XR: None -WB Status: NWB in posterior splint. -Sutures: intact. To be removed next visit. -Medications: Continue lovenox. -Foot redressed. Well padded to prevent further bandage irritation.  No follow-ups on file.

## 2019-06-26 ENCOUNTER — Ambulatory Visit (INDEPENDENT_AMBULATORY_CARE_PROVIDER_SITE_OTHER): Payer: Self-pay | Admitting: Podiatry

## 2019-06-26 DIAGNOSIS — S86311S Strain of muscle(s) and tendon(s) of peroneal muscle group at lower leg level, right leg, sequela: Secondary | ICD-10-CM

## 2019-06-26 DIAGNOSIS — S86311D Strain of muscle(s) and tendon(s) of peroneal muscle group at lower leg level, right leg, subsequent encounter: Secondary | ICD-10-CM

## 2019-06-26 DIAGNOSIS — Z9889 Other specified postprocedural states: Secondary | ICD-10-CM

## 2019-06-29 ENCOUNTER — Encounter: Payer: Self-pay | Admitting: Podiatry

## 2019-06-29 NOTE — Progress Notes (Signed)
Subjective:  Patient ID: Jill Preston, female    DOB: 07-28-1933,  MRN: [id number]  Chief Complaint  Patient presents with  . Routine Post Op     DOS 05/27/2019 REPAIR PERONEAL TENDON VS. REPAIR TENDON RT    DOS: 05/27/2019 Procedure: Repair of peroneal tendons  85 y.o. female returns for post-op check.  Patient states that she is doing a lot better.  She has a secondary concern of swelling.  She has been nonambulatory with the posterior splint.  She states that the pain meds are helping.  She still has some pain meds left.  Review of Systems: Negative except as noted in the HPI. Denies N/V/F/Ch.  Past Medical History:  Diagnosis Date  . Arthritis    right knee  . Cancer [facility]) yrs ago   skin cancer removed from face  . Chronic diastolic CHF (congestive heart failure) ([place])   . Chronic venous insufficiency    LEA VENOUS, 10/17/2011 - mild reflux in bilateral common femoral veins  . CKD (chronic kidney disease), stage III ([place])   . Coronary artery disease    a. s/p PCI/BMS to prox LAD and balloon angioplasty to mLAD with suboptimal result in 2000. b. Abnl nuc 2012, cath 09/2011 - showed that the overall territory of potential ischemia was small and attributable to a moderately diseased small second diagonal artery. Med rx.  Marland Kitchen Dyspnea    with activity  . Headache   . History of blood transfusion 2017  . Hyperlipidemia   . Hypertension   . Hypertensive heart disease   . Hypothyroidism   . Obesity   . Pneumonia    several times  . Stroke [facility])    pt. states she had "light stroke" in sept. 1980  . Urinary incontinence     Current Outpatient Medications:  .  amLODipine (NORVASC) 2.5 MG tablet, Take 1 tablet (2.5 mg total) by mouth daily., Disp: 180 tablet, Rfl: 3 .  aspirin EC 81 MG tablet, Take 1 tablet (81 mg total) by mouth daily., Disp: 90 tablet, Rfl: 3 .  bisacodyl (DULCOLAX) 5 MG EC tablet, Take 10 mg by mouth daily. , Disp: , Rfl:  .  Cholecalciferol (VITAMIN D)  125 MCG (5000 UT) CAPS, Take 5,000 Units by mouth daily., Disp: , Rfl:  .  clindamycin (CLEOCIN) 300 MG capsule, Take 1 capsule (300 mg total) by mouth 2 (two) times daily., Disp: 14 capsule, Rfl: 0 .  clobetasol ointment (TEMOVATE) AB-123456789 %, Apply 1 application topically daily as needed (rash). , Disp: , Rfl:  .  enoxaparin (LOVENOX) 30 MG/0.3ML injection, Inject 0.3 mLs (30 mg total) into the skin daily., Disp: 8.4 mL, Rfl: 0 .  ezetimibe (ZETIA) 10 MG tablet, Take 1 tablet (10 mg total) by mouth daily., Disp: 90 tablet, Rfl: 3 .  furosemide (LASIX) 80 MG tablet, Take 80 mg at 6 am and 80 mg at 1 pm (Patient taking differently: Take 80 mg by mouth 2 (two) times daily as needed for fluid. ), Disp: 180 tablet, Rfl: 3 .  isosorbide mononitrate (IMDUR) 30 MG 24 hr tablet, TAKE 2 TABLETS BY MOUTH TWICE DAILY. (Patient taking differently: Take 60 mg by mouth daily. ), Disp: 360 tablet, Rfl: 1 .  KLOR-CON M20 20 MEQ tablet, Take 2 tablets (40 mEq total) by mouth daily. (Patient taking differently: Take 20 mEq by mouth daily. ), Disp: 180 tablet, Rfl: 3 .  levothyroxine (SYNTHROID, LEVOTHROID) 112 MCG tablet, Take 112 mcg by mouth daily  before breakfast. , Disp: , Rfl: 4 .  losartan-hydrochlorothiazide (HYZAAR) 100-12.5 MG tablet, TAKE 1 TABLET BY MOUTH EVERY DAY, Disp: 90 tablet, Rfl: 2 .  metolazone (ZAROXOLYN) 2.5 MG tablet, Take 1 tablet (2.5 mg total) by mouth as needed. Take 2.5 mg 30 min before AM dose of Lasix (only take when instructed to do so by Dr. Sallyanne Kuster) (Patient taking differently: Take 2.5 mg by mouth daily as needed (weight over 218 lbs). ), Disp: 30 tablet, Rfl: 6 .  Multiple Vitamin (MULTIVITAMIN WITH MINERALS) TABS tablet, Take 2 tablets by mouth daily. Centrum 50+ , Disp: , Rfl:  .  nitrofurantoin (MACRODANTIN) 100 MG capsule, TK ONE C PO  BID, Disp: , Rfl:  .  nitroGLYCERIN (NITROSTAT) 0.4 MG SL tablet, Place 1 tablet (0.4 mg total) under the tongue every 5 (five) minutes as needed for  chest pain., Disp: 25 tablet, Rfl: 2 .  nystatin (MYCOSTATIN/NYSTOP) powder, Apply 1 Bottle topically daily as needed (rash). , Disp: , Rfl:  .  oxyCODONE-acetaminophen (PERCOCET) 10-325 MG tablet, Take 1 tablet by mouth every 4 (four) hours as needed for pain., Disp: 20 tablet, Rfl: 0 .  Potassium Chloride ER 20 MEQ TBCR, , Disp: , Rfl:  .  pravastatin (PRAVACHOL) 80 MG tablet, Take 80 mg by mouth at bedtime., Disp: , Rfl:  .  vitamin C (ASCORBIC ACID) 500 MG tablet, Take 1,000 mg by mouth daily. , Disp: , Rfl:   Social History   Tobacco Use  Smoking Status Never Smoker  Smokeless Tobacco Never Used    Allergies  Allergen Reactions  . Atorvastatin Anaphylaxis    Patient does not remember; caused pneumonia- took her off of it per patient.   . Penicillins Anaphylaxis and Hives    PATIENT HAS HAD A PCN REACTION WITH IMMEDIATE RASH, FACIAL/TONGUE/THROAT SWELLING, SOB, OR LIGHTHEADEDNESS WITH HYPOTENSION:  #  #  #  YES  #  #  #   HAS PT DEVELOPED SEVERE RASH INVOLVING MUCUS MEMBRANES or SKIN NECROSIS: #  #  #  YES  #  #  #   PATIENT HAS HAD A PCN REACTION THAT REQUIRED HOSPITALIZATION:  #  #  #  YES  #  #  #  Has patient had a PCN reaction occurring within the last 10 years:#  #  #  YES  #  #  #    . Shellfish Allergy Anaphylaxis and Nausea And Vomiting    Severe nausea and vomiting  . Aminophylline Itching, Swelling and Rash    Pt experienced burning urination, itching, redness/rash, and swelling of genitals after given this medication via IV push.  . Iodinated Diagnostic Agents Swelling and Other (See Comments)    SWELLING REACTION UNSPECIFIED  fFLUSHING  . Latex Other (See Comments)    Causes blisters  . Oxybutynin Chloride Other (See Comments)    "blisters"  . Vancomycin Hives    06/02/2018- Hives arm, back and chest  . Adhesive [Tape] Rash    Paper tape is ok  . Betadine [Povidone Iodine] Rash  . Ciprofloxacin Hives  . Codeine Nausea And Vomiting    .  Marland Kitchen Ranolazine Other  (See Comments)    Constipation  .   Objective:  There were no vitals filed for this visit. There is no height or weight on file to calculate BMI. Constitutional Well developed. Well nourished.  Vascular Foot warm and well perfused. Capillary refill normal to all digits.   Neurologic Normal speech. Oriented to  person, place, and time. Epicritic sensation to light touch grossly present bilaterally.  Dermatologic Skin healing well without signs of infection. Skin edges well coapted without signs of infection.  Orthopedic: Tenderness to palpation noted about the surgical site.   Radiographs: None Assessment:   1. Post-operative state   2. Tear of peroneal tendon, right, sequela   3. Tear of peroneal tendon, right, subsequent encounter    Plan:  Patient was evaluated and treated and all questions answered.  S/p foot surgery right peroneal tendon repairs x2 -Progressing as expected post-operatively. -XR: None -WB Status: Nonweightbearing in a posterior splint -Sutures: Staples were removed.  No signs of acute infection.  The skin incision was well coapted. -Medications: None -Foot redressed.  No follow-ups on file.

## 2019-07-02 ENCOUNTER — Other Ambulatory Visit: Payer: Self-pay

## 2019-07-02 ENCOUNTER — Ambulatory Visit (INDEPENDENT_AMBULATORY_CARE_PROVIDER_SITE_OTHER): Payer: Self-pay | Admitting: Podiatry

## 2019-07-02 ENCOUNTER — Telehealth: Payer: Self-pay | Admitting: *Deleted

## 2019-07-02 DIAGNOSIS — Z9889 Other specified postprocedural states: Secondary | ICD-10-CM

## 2019-07-02 MED ORDER — HYDROMORPHONE HCL 2 MG PO TABS: 1.0000 mg | ORAL_TABLET | ORAL | 0 refills | Status: DC | PRN

## 2019-07-02 NOTE — Telephone Encounter (Signed)
Pt called and stated she would like to keep using PT with [facility]. Said she is unable to go somewhere for PT due to her husband having health issues with his back. Pt is also requesting something for the pain. States she is not sleeping at night because of the pain, and stated it doesn't have to be the strongest pain medication.

## 2019-07-02 NOTE — Addendum Note (Signed)
Addended by: Hardie Pulley on: 07/02/2019 02:55 PM   Modules accepted: Orders

## 2019-07-02 NOTE — Progress Notes (Signed)
Subjective:  Patient ID: Jill Preston, female    DOB: December 11, 1932,  MRN: [id number]  Chief Complaint  Patient presents with  . Routine Post Op     1 wk povDOS 05/27/2019 REPAIR PERONEAL TENDON VS. REPAIR TENDON RT, pt states that she is in a lot of pain,and is looking to get pain medication to help her go to sleep at night   DOS: 05/27/2019 Procedure: Right Peroneal tendon repair x2  85 y.o. female returns for post-op check. Hx as above.  Review of Systems: Negative except as noted in the HPI. Denies N/V/F/Ch.  Past Medical History:  Diagnosis Date  . Arthritis    right knee  . Cancer [facility]) yrs ago   skin cancer removed from face  . Chronic diastolic CHF (congestive heart failure) ([place])   . Chronic venous insufficiency    LEA VENOUS, 10/17/2011 - mild reflux in bilateral common femoral veins  . CKD (chronic kidney disease), stage III   . Coronary artery disease    a. s/p PCI/BMS to prox LAD and balloon angioplasty to mLAD with suboptimal result in 2000. b. Abnl nuc 2012, cath 09/2011 - showed that the overall territory of potential ischemia was small and attributable to a moderately diseased small second diagonal artery. Med rx.  Marland Kitchen Dyspnea    with activity  . Headache   . History of blood transfusion 2017  . Hyperlipidemia   . Hypertension   . Hypertensive heart disease   . Hypothyroidism   . Obesity   . Pneumonia    several times  . Stroke [facility])    pt. states she had "light stroke" in sept. 1980  . Urinary incontinence     Current Outpatient Medications:  .  amLODipine (NORVASC) 2.5 MG tablet, Take 1 tablet (2.5 mg total) by mouth daily., Disp: 180 tablet, Rfl: 3 .  aspirin EC 81 MG tablet, Take 1 tablet (81 mg total) by mouth daily., Disp: 90 tablet, Rfl: 3 .  bisacodyl (DULCOLAX) 5 MG EC tablet, Take 10 mg by mouth daily. , Disp: , Rfl:  .  Cholecalciferol (VITAMIN D) 125 MCG (5000 UT) CAPS, Take 5,000 Units by mouth daily., Disp: , Rfl:  .  clindamycin (CLEOCIN)  300 MG capsule, Take 1 capsule (300 mg total) by mouth 2 (two) times daily., Disp: 14 capsule, Rfl: 0 .  clobetasol ointment (TEMOVATE) AB-123456789 %, Apply 1 application topically daily as needed (rash). , Disp: , Rfl:  .  enoxaparin (LOVENOX) 30 MG/0.3ML injection, Inject 0.3 mLs (30 mg total) into the skin daily., Disp: 8.4 mL, Rfl: 0 .  ezetimibe (ZETIA) 10 MG tablet, Take 1 tablet (10 mg total) by mouth daily., Disp: 90 tablet, Rfl: 3 .  furosemide (LASIX) 80 MG tablet, Take 80 mg at 6 am and 80 mg at 1 pm (Patient taking differently: Take 80 mg by mouth 2 (two) times daily as needed for fluid. ), Disp: 180 tablet, Rfl: 3 .  isosorbide mononitrate (IMDUR) 30 MG 24 hr tablet, TAKE 2 TABLETS BY MOUTH TWICE DAILY. (Patient taking differently: Take 60 mg by mouth daily. ), Disp: 360 tablet, Rfl: 1 .  KLOR-CON M20 20 MEQ tablet, Take 2 tablets (40 mEq total) by mouth daily. (Patient taking differently: Take 20 mEq by mouth daily. ), Disp: 180 tablet, Rfl: 3 .  levothyroxine (SYNTHROID, LEVOTHROID) 112 MCG tablet, Take 112 mcg by mouth daily before breakfast. , Disp: , Rfl: 4 .  losartan-hydrochlorothiazide (HYZAAR) 100-12.5 MG tablet, TAKE 1  TABLET BY MOUTH EVERY DAY, Disp: 90 tablet, Rfl: 2 .  metolazone (ZAROXOLYN) 2.5 MG tablet, Take 1 tablet (2.5 mg total) by mouth as needed. Take 2.5 mg 30 min before AM dose of Lasix (only take when instructed to do so by Dr. Sallyanne Kuster) (Patient taking differently: Take 2.5 mg by mouth daily as needed (weight over 218 lbs). ), Disp: 30 tablet, Rfl: 6 .  Multiple Vitamin (MULTIVITAMIN WITH MINERALS) TABS tablet, Take 2 tablets by mouth daily. Centrum 50+ , Disp: , Rfl:  .  nitrofurantoin (MACRODANTIN) 100 MG capsule, TK ONE C PO  BID, Disp: , Rfl:  .  nitroGLYCERIN (NITROSTAT) 0.4 MG SL tablet, Place 1 tablet (0.4 mg total) under the tongue every 5 (five) minutes as needed for chest pain., Disp: 25 tablet, Rfl: 2 .  nystatin (MYCOSTATIN/NYSTOP) powder, Apply 1 Bottle  topically daily as needed (rash). , Disp: , Rfl:  .  oxyCODONE-acetaminophen (PERCOCET) 10-325 MG tablet, Take 1 tablet by mouth every 4 (four) hours as needed for pain., Disp: 20 tablet, Rfl: 0 .  Potassium Chloride ER 20 MEQ TBCR, , Disp: , Rfl:  .  pravastatin (PRAVACHOL) 80 MG tablet, Take 80 mg by mouth at bedtime., Disp: , Rfl:  .  vitamin C (ASCORBIC ACID) 500 MG tablet, Take 1,000 mg by mouth daily. , Disp: , Rfl:   Social History   Tobacco Use  Smoking Status Never Smoker  Smokeless Tobacco Never Used    Allergies  Allergen Reactions  . Atorvastatin Anaphylaxis    Patient does not remember; caused pneumonia- took her off of it per patient.   . Penicillins Anaphylaxis and Hives    PATIENT HAS HAD A PCN REACTION WITH IMMEDIATE RASH, FACIAL/TONGUE/THROAT SWELLING, SOB, OR LIGHTHEADEDNESS WITH HYPOTENSION:  #  #  #  YES  #  #  #   HAS PT DEVELOPED SEVERE RASH INVOLVING MUCUS MEMBRANES or SKIN NECROSIS: #  #  #  YES  #  #  #   PATIENT HAS HAD A PCN REACTION THAT REQUIRED HOSPITALIZATION:  #  #  #  YES  #  #  #  Has patient had a PCN reaction occurring within the last 10 years:#  #  #  YES  #  #  #    . Shellfish Allergy Anaphylaxis and Nausea And Vomiting    Severe nausea and vomiting  . Aminophylline Itching, Swelling and Rash    Pt experienced burning urination, itching, redness/rash, and swelling of genitals after given this medication via IV push.  . Iodinated Diagnostic Agents Swelling and Other (See Comments)    SWELLING REACTION UNSPECIFIED  fFLUSHING  . Latex Other (See Comments)    Causes blisters  . Oxybutynin Chloride Other (See Comments)    "blisters"  . Vancomycin Hives    06/02/2018- Hives arm, back and chest  . Adhesive [Tape] Rash    Paper tape is ok  . Betadine [Povidone Iodine] Rash  . Ciprofloxacin Hives  . Codeine Nausea And Vomiting    .  Marland Kitchen Ranolazine Other (See Comments)    Constipation  .   Objective:   There were no vitals filed for this  visit. There is no height or weight on file to calculate BMI. Constitutional Well developed. Well nourished.  Vascular Foot warm and well perfused. Capillary refill normal to all digits.   Neurologic Normal speech. Oriented to person, place, and time. Epicritic sensation to light touch grossly present bilaterally.  Dermatologic Skin  well healed.  Orthopedic: No tenderness to palpation noted about the surgical site.   Radiographs: None Assessment:   1. Post-operative state    Plan:  Patient was evaluated and treated and all questions answered.  S/p foot surgery right -Progressing as expected post-operatively. -Skin well healed -Start PT for PROM x1 week then AROM x1 week. Plan to start WB in 2 weeks -F/u in 2 weeks  No follow-ups on file.

## 2019-07-02 NOTE — Telephone Encounter (Signed)
PROM x1 week AROM x1 week Start WB in [place] in 2 weeks if above tolerated and safe

## 2019-07-02 NOTE — Telephone Encounter (Signed)
Left message informing pt I had reviewed her clinicals and seen she had orders for [facility] PT, and to see if she had received PT through them, if not I needed to let her know that [facility] PT would not be covered if there are no skilled nursing order, and we could schedule her with the [facility] - in-office.

## 2019-07-02 NOTE — Telephone Encounter (Signed)
-----   Message from [name], DPM sent at 07/02/2019 12:09 PM EDT ----- Can we refer for [facility]. If not available refer to [facility]. I know we are having difficulty getting [facility] PT and pt is aware of this.

## 2019-07-02 NOTE — Telephone Encounter (Signed)
Faxed required form for PT reinstatement.

## 2019-07-05 DIAGNOSIS — I2510 Atherosclerotic heart disease of native coronary artery without angina pectoris: Secondary | ICD-10-CM | POA: Diagnosis not present

## 2019-07-05 DIAGNOSIS — N183 Chronic kidney disease, stage 3 unspecified: Secondary | ICD-10-CM | POA: Diagnosis not present

## 2019-07-05 DIAGNOSIS — I5032 Chronic diastolic (congestive) heart failure: Secondary | ICD-10-CM | POA: Diagnosis not present

## 2019-07-05 DIAGNOSIS — Z7982 Long term (current) use of aspirin: Secondary | ICD-10-CM | POA: Diagnosis not present

## 2019-07-05 DIAGNOSIS — Z8673 Personal history of transient ischemic attack (TIA), and cerebral infarction without residual deficits: Secondary | ICD-10-CM | POA: Diagnosis not present

## 2019-07-05 DIAGNOSIS — Z85828 Personal history of other malignant neoplasm of skin: Secondary | ICD-10-CM | POA: Diagnosis not present

## 2019-07-05 DIAGNOSIS — I130 Hypertensive heart and chronic kidney disease with heart failure and stage 1 through stage 4 chronic kidney disease, or unspecified chronic kidney disease: Secondary | ICD-10-CM | POA: Diagnosis not present

## 2019-07-05 DIAGNOSIS — Z9181 History of falling: Secondary | ICD-10-CM | POA: Diagnosis not present

## 2019-07-05 DIAGNOSIS — E039 Hypothyroidism, unspecified: Secondary | ICD-10-CM | POA: Diagnosis not present

## 2019-07-05 DIAGNOSIS — Z8701 Personal history of pneumonia (recurrent): Secondary | ICD-10-CM | POA: Diagnosis not present

## 2019-07-05 DIAGNOSIS — Z4789 Encounter for other orthopedic aftercare: Secondary | ICD-10-CM | POA: Diagnosis not present

## 2019-07-05 DIAGNOSIS — R32 Unspecified urinary incontinence: Secondary | ICD-10-CM | POA: Diagnosis not present

## 2019-07-05 DIAGNOSIS — Z993 Dependence on wheelchair: Secondary | ICD-10-CM | POA: Diagnosis not present

## 2019-07-05 DIAGNOSIS — I872 Venous insufficiency (chronic) (peripheral): Secondary | ICD-10-CM | POA: Diagnosis not present

## 2019-07-13 ENCOUNTER — Other Ambulatory Visit: Payer: Self-pay

## 2019-07-13 DIAGNOSIS — Z20828 Contact with and (suspected) exposure to other viral communicable diseases: Secondary | ICD-10-CM | POA: Diagnosis not present

## 2019-07-13 DIAGNOSIS — Z20822 Contact with and (suspected) exposure to covid-19: Secondary | ICD-10-CM

## 2019-07-14 LAB — NOVEL CORONAVIRUS, NAA: SARS-CoV-2, NAA: NOT DETECTED

## 2019-07-15 DIAGNOSIS — Z4789 Encounter for other orthopedic aftercare: Secondary | ICD-10-CM | POA: Diagnosis not present

## 2019-07-15 DIAGNOSIS — Z8701 Personal history of pneumonia (recurrent): Secondary | ICD-10-CM | POA: Diagnosis not present

## 2019-07-15 DIAGNOSIS — I2510 Atherosclerotic heart disease of native coronary artery without angina pectoris: Secondary | ICD-10-CM | POA: Diagnosis not present

## 2019-07-15 DIAGNOSIS — R32 Unspecified urinary incontinence: Secondary | ICD-10-CM | POA: Diagnosis not present

## 2019-07-15 DIAGNOSIS — Z8673 Personal history of transient ischemic attack (TIA), and cerebral infarction without residual deficits: Secondary | ICD-10-CM | POA: Diagnosis not present

## 2019-07-15 DIAGNOSIS — Z7982 Long term (current) use of aspirin: Secondary | ICD-10-CM | POA: Diagnosis not present

## 2019-07-15 DIAGNOSIS — Z9181 History of falling: Secondary | ICD-10-CM | POA: Diagnosis not present

## 2019-07-15 DIAGNOSIS — I872 Venous insufficiency (chronic) (peripheral): Secondary | ICD-10-CM | POA: Diagnosis not present

## 2019-07-15 DIAGNOSIS — I5032 Chronic diastolic (congestive) heart failure: Secondary | ICD-10-CM | POA: Diagnosis not present

## 2019-07-15 DIAGNOSIS — Z85828 Personal history of other malignant neoplasm of skin: Secondary | ICD-10-CM | POA: Diagnosis not present

## 2019-07-15 DIAGNOSIS — I130 Hypertensive heart and chronic kidney disease with heart failure and stage 1 through stage 4 chronic kidney disease, or unspecified chronic kidney disease: Secondary | ICD-10-CM | POA: Diagnosis not present

## 2019-07-15 DIAGNOSIS — E039 Hypothyroidism, unspecified: Secondary | ICD-10-CM | POA: Diagnosis not present

## 2019-07-15 DIAGNOSIS — Z993 Dependence on wheelchair: Secondary | ICD-10-CM | POA: Diagnosis not present

## 2019-07-15 DIAGNOSIS — N183 Chronic kidney disease, stage 3 unspecified: Secondary | ICD-10-CM | POA: Diagnosis not present

## 2019-07-20 ENCOUNTER — Ambulatory Visit: Payer: Self-pay | Admitting: Pharmacist

## 2019-07-21 ENCOUNTER — Other Ambulatory Visit: Payer: Self-pay

## 2019-07-21 DIAGNOSIS — I2510 Atherosclerotic heart disease of native coronary artery without angina pectoris: Secondary | ICD-10-CM | POA: Diagnosis not present

## 2019-07-21 DIAGNOSIS — Z8701 Personal history of pneumonia (recurrent): Secondary | ICD-10-CM | POA: Diagnosis not present

## 2019-07-21 DIAGNOSIS — N183 Chronic kidney disease, stage 3 unspecified: Secondary | ICD-10-CM | POA: Diagnosis not present

## 2019-07-21 DIAGNOSIS — I5032 Chronic diastolic (congestive) heart failure: Secondary | ICD-10-CM | POA: Diagnosis not present

## 2019-07-21 DIAGNOSIS — R32 Unspecified urinary incontinence: Secondary | ICD-10-CM | POA: Diagnosis not present

## 2019-07-21 DIAGNOSIS — E039 Hypothyroidism, unspecified: Secondary | ICD-10-CM | POA: Diagnosis not present

## 2019-07-21 DIAGNOSIS — I872 Venous insufficiency (chronic) (peripheral): Secondary | ICD-10-CM | POA: Diagnosis not present

## 2019-07-21 DIAGNOSIS — Z9181 History of falling: Secondary | ICD-10-CM | POA: Diagnosis not present

## 2019-07-21 DIAGNOSIS — Z7982 Long term (current) use of aspirin: Secondary | ICD-10-CM | POA: Diagnosis not present

## 2019-07-21 DIAGNOSIS — I130 Hypertensive heart and chronic kidney disease with heart failure and stage 1 through stage 4 chronic kidney disease, or unspecified chronic kidney disease: Secondary | ICD-10-CM | POA: Diagnosis not present

## 2019-07-21 DIAGNOSIS — Z993 Dependence on wheelchair: Secondary | ICD-10-CM | POA: Diagnosis not present

## 2019-07-21 DIAGNOSIS — Z85828 Personal history of other malignant neoplasm of skin: Secondary | ICD-10-CM | POA: Diagnosis not present

## 2019-07-21 DIAGNOSIS — Z8673 Personal history of transient ischemic attack (TIA), and cerebral infarction without residual deficits: Secondary | ICD-10-CM | POA: Diagnosis not present

## 2019-07-21 DIAGNOSIS — Z4789 Encounter for other orthopedic aftercare: Secondary | ICD-10-CM | POA: Diagnosis not present

## 2019-07-21 NOTE — Patient Outreach (Signed)
  [facility]) Care Management Chronic Special Needs Program  07/21/2019  Name: MINDE GASH DOB: [birth year]  MRN: [id number]  Ms. Amelita Whiteaker is enrolled in a chronic special needs plan for Heart Failure. Reviewed and updated care plan.  Subjective: Client states that she can not put weight on her rt foot yet from her surgery.  States she has not been able to weight daily but she has not had swelling due to her heart.  Denies any shortness of breath or chest pains.  States she is follow a low salt diet.  States she is getting physical therapy on her leg and she is to go see her foot doctor Thursday. States her foot is healing well.   States she is getting her medications without difficulty.  States her blood pressure has been good and it was 130/70 today.  States her grandson is staying with her to help her and her husband as needed.  Goals Addressed            This Visit's Progress   . Client understands the importance of follow-up with providers by attending scheduled visits   On track   . Client will increase activity tolerance by  reported less SOB within 6 months-continue 04/06/19   No change    Pace activity when cleared by foot surgeon    . Client will not report change from baseline and no repeated symptoms of stroke with in the next 12 months    On track   . Client will report no worsening of symptoms related to heart disease within the next 6 months continue 04/06/19   On track    Call Dr. Sallyanne Kuster for weight gain of 3 lbs in one day or 5 lbs in one week    . Client will verbalize knowledge of self management of Hypertension as evidences by BP reading of 140/90 or less; or as defined by provider   On track   . Decrease inpatient Heart Failure admissions/ readmissions with in the year   On track   . Decrease the use of hospital emergency department related to heart failure within the next year   On track   . Maintain timely refills of Heart Failure  medication as prescribed within the year    On track   . COMPLETED: Visit Primary Care Provider or Cardiologist at least 2 times per year       Visits 02/10/19, 05/13/19     Client has not been able to weigh daily due to foot surgery and she is currently non-weight bearing on her rt foot Reports only swelling is in rt ft due to surgery and not her heart Denies any shortness of breath or chest pains Reinforced to follow a low sodium diet Encouraged to resume daily weights when she is able Reinforced s/s of HF to call MD Instructed to call Dr. Victorino December office to schedule her next follow up visit  Plan:  Send successful outreach letter with a copy of their individualized care plan and Send individual care plan to provider  Chronic care management coordinator will outreach in:  3-4 Months     Peter Garter RN, [facility], [place] Management Coordinator [place] [facility] [phone]

## 2019-07-23 ENCOUNTER — Other Ambulatory Visit: Payer: Self-pay

## 2019-07-23 ENCOUNTER — Ambulatory Visit: Payer: HMO | Admitting: Podiatry

## 2019-07-23 DIAGNOSIS — Z9889 Other specified postprocedural states: Secondary | ICD-10-CM

## 2019-07-23 NOTE — Progress Notes (Signed)
Subjective:  Patient ID: Jill Preston, female    DOB: 1933-03-22,  MRN: [id number]  Chief Complaint  Patient presents with  . Routine Post Op    DOS 05/27/19 - peroneal tendon repair right - doing better - incision looks great -PT coming to her house, working on swelling   DOS: 05/27/2019 Procedure: Right Peroneal tendon repair x2  85 y.o. female returns for post-op check. Hx as above. Doing well minimal pain and swelling.  Review of Systems: Negative except as noted in the HPI. Denies N/V/F/Ch.  Past Medical History:  Diagnosis Date  . Arthritis    right knee  . Cancer [facility]) yrs ago   skin cancer removed from face  . Chronic diastolic CHF (congestive heart failure) ([place])   . Chronic venous insufficiency    LEA VENOUS, 10/17/2011 - mild reflux in bilateral common femoral veins  . CKD (chronic kidney disease), stage III   . Coronary artery disease    a. s/p PCI/BMS to prox LAD and balloon angioplasty to mLAD with suboptimal result in 2000. b. Abnl nuc 2012, cath 09/2011 - showed that the overall territory of potential ischemia was small and attributable to a moderately diseased small second diagonal artery. Med rx.  Marland Kitchen Dyspnea    with activity  . Headache   . History of blood transfusion 2017  . Hyperlipidemia   . Hypertension   . Hypertensive heart disease   . Hypothyroidism   . Obesity   . Pneumonia    several times  . Stroke [facility])    pt. states she had "light stroke" in sept. 1980  . Urinary incontinence     Current Outpatient Medications:  .  amLODipine (NORVASC) 2.5 MG tablet, Take 1 tablet (2.5 mg total) by mouth daily., Disp: 180 tablet, Rfl: 3 .  aspirin EC 81 MG tablet, Take 1 tablet (81 mg total) by mouth daily., Disp: 90 tablet, Rfl: 3 .  bisacodyl (DULCOLAX) 5 MG EC tablet, Take 10 mg by mouth daily. , Disp: , Rfl:  .  brompheniramine-pseudoephedrine-DM 30-2-10 MG/5ML syrup, Take 5 mLs by mouth 3 (three) times daily as needed., Disp: , Rfl:  .   Cholecalciferol (VITAMIN D) 125 MCG (5000 UT) CAPS, Take 5,000 Units by mouth daily., Disp: , Rfl:  .  clindamycin (CLEOCIN) 300 MG capsule, Take 1 capsule (300 mg total) by mouth 2 (two) times daily., Disp: 14 capsule, Rfl: 0 .  clobetasol ointment (TEMOVATE) AB-123456789 %, Apply 1 application topically daily as needed (rash). , Disp: , Rfl:  .  enoxaparin (LOVENOX) 30 MG/0.3ML injection, Inject 0.3 mLs (30 mg total) into the skin daily., Disp: 8.4 mL, Rfl: 0 .  ezetimibe (ZETIA) 10 MG tablet, Take 1 tablet (10 mg total) by mouth daily., Disp: 90 tablet, Rfl: 3 .  furosemide (LASIX) 80 MG tablet, Take 80 mg at 6 am and 80 mg at 1 pm (Patient taking differently: Take 80 mg by mouth 2 (two) times daily as needed for fluid. ), Disp: 180 tablet, Rfl: 3 .  HYDROmorphone (DILAUDID) 2 MG tablet, Take 0.5 tablets (1 mg total) by mouth every 4 (four) hours as needed for severe pain., Disp: 20 tablet, Rfl: 0 .  isosorbide mononitrate (IMDUR) 30 MG 24 hr tablet, TAKE 2 TABLETS BY MOUTH TWICE DAILY. (Patient taking differently: Take 60 mg by mouth daily. ), Disp: 360 tablet, Rfl: 1 .  KLOR-CON M20 20 MEQ tablet, Take 2 tablets (40 mEq total) by mouth daily. (Patient taking differently:  Take 20 mEq by mouth daily. ), Disp: 180 tablet, Rfl: 3 .  levothyroxine (SYNTHROID, LEVOTHROID) 112 MCG tablet, Take 112 mcg by mouth daily before breakfast. , Disp: , Rfl: 4 .  losartan-hydrochlorothiazide (HYZAAR) 100-12.5 MG tablet, TAKE 1 TABLET BY MOUTH EVERY DAY, Disp: 90 tablet, Rfl: 2 .  metolazone (ZAROXOLYN) 2.5 MG tablet, Take 1 tablet (2.5 mg total) by mouth as needed. Take 2.5 mg 30 min before AM dose of Lasix (only take when instructed to do so by Dr. Sallyanne Kuster) (Patient taking differently: Take 2.5 mg by mouth daily as needed (weight over 218 lbs). ), Disp: 30 tablet, Rfl: 6 .  Multiple Vitamin (MULTIVITAMIN WITH MINERALS) TABS tablet, Take 2 tablets by mouth daily. Centrum 50+ , Disp: , Rfl:  .  nitrofurantoin (MACRODANTIN)  100 MG capsule, TK ONE C PO  BID, Disp: , Rfl:  .  nitroGLYCERIN (NITROSTAT) 0.4 MG SL tablet, Place 1 tablet (0.4 mg total) under the tongue every 5 (five) minutes as needed for chest pain., Disp: 25 tablet, Rfl: 2 .  nystatin (MYCOSTATIN/NYSTOP) powder, Apply 1 Bottle topically daily as needed (rash). , Disp: , Rfl:  .  oxyCODONE-acetaminophen (PERCOCET) 10-325 MG tablet, Take 1 tablet by mouth every 4 (four) hours as needed for pain., Disp: 20 tablet, Rfl: 0 .  Potassium Chloride ER 20 MEQ TBCR, , Disp: , Rfl:  .  pravastatin (PRAVACHOL) 80 MG tablet, Take 80 mg by mouth at bedtime., Disp: , Rfl:  .  vitamin C (ASCORBIC ACID) 500 MG tablet, Take 1,000 mg by mouth daily. , Disp: , Rfl:   Social History   Tobacco Use  Smoking Status Never Smoker  Smokeless Tobacco Never Used    Allergies  Allergen Reactions  . Atorvastatin Anaphylaxis    Patient does not remember; caused pneumonia- took her off of it per patient.   . Penicillins Anaphylaxis and Hives    PATIENT HAS HAD A PCN REACTION WITH IMMEDIATE RASH, FACIAL/TONGUE/THROAT SWELLING, SOB, OR LIGHTHEADEDNESS WITH HYPOTENSION:  #  #  #  YES  #  #  #   HAS PT DEVELOPED SEVERE RASH INVOLVING MUCUS MEMBRANES or SKIN NECROSIS: #  #  #  YES  #  #  #   PATIENT HAS HAD A PCN REACTION THAT REQUIRED HOSPITALIZATION:  #  #  #  YES  #  #  #  Has patient had a PCN reaction occurring within the last 10 years:#  #  #  YES  #  #  #    . Shellfish Allergy Anaphylaxis and Nausea And Vomiting    Severe nausea and vomiting  . Aminophylline Itching, Swelling and Rash    Pt experienced burning urination, itching, redness/rash, and swelling of genitals after given this medication via IV push.  . Iodinated Diagnostic Agents Swelling and Other (See Comments)    SWELLING REACTION UNSPECIFIED  fFLUSHING  . Latex Other (See Comments)    Causes blisters  . Oxybutynin Chloride Other (See Comments)    "blisters"  . Vancomycin Hives    06/02/2018- Hives arm,  back and chest  . Adhesive [Tape] Rash    Paper tape is ok  . Betadine [Povidone Iodine] Rash  . Ciprofloxacin Hives  . Codeine Nausea And Vomiting    .  Marland Kitchen Ranolazine Other (See Comments)    Constipation  .   Objective:   There were no vitals filed for this visit. There is no height or weight on file to  calculate BMI. Constitutional Well developed. Well nourished.  Vascular Foot warm and well perfused. Capillary refill normal to all digits.   Neurologic Normal speech. Oriented to person, place, and time. Epicritic sensation to light touch grossly present bilaterally.  Dermatologic Skin well healed.  Orthopedic: No tenderness to palpation noted about the surgical site. Slight pain with inv/ev good peroneal strength.   Radiographs: None Assessment:   No diagnosis found. Plan:  Patient was evaluated and treated and all questions answered.  S/p foot surgery right -Progressing as expected post-operatively. -Continue PT, start WBAT in [facility] boot -F/u in 1 month for recheck  Return in about 1 month (around 08/22/2019) for Post-Op (No XRs).

## 2019-07-28 ENCOUNTER — Ambulatory Visit: Payer: Self-pay | Admitting: Pharmacist

## 2019-08-04 DIAGNOSIS — I130 Hypertensive heart and chronic kidney disease with heart failure and stage 1 through stage 4 chronic kidney disease, or unspecified chronic kidney disease: Secondary | ICD-10-CM | POA: Diagnosis not present

## 2019-08-04 DIAGNOSIS — I5032 Chronic diastolic (congestive) heart failure: Secondary | ICD-10-CM | POA: Diagnosis not present

## 2019-08-04 DIAGNOSIS — Z7982 Long term (current) use of aspirin: Secondary | ICD-10-CM | POA: Diagnosis not present

## 2019-08-04 DIAGNOSIS — Z4789 Encounter for other orthopedic aftercare: Secondary | ICD-10-CM | POA: Diagnosis not present

## 2019-08-04 DIAGNOSIS — I10 Essential (primary) hypertension: Secondary | ICD-10-CM | POA: Diagnosis not present

## 2019-08-04 DIAGNOSIS — E039 Hypothyroidism, unspecified: Secondary | ICD-10-CM | POA: Diagnosis not present

## 2019-08-04 DIAGNOSIS — I2510 Atherosclerotic heart disease of native coronary artery without angina pectoris: Secondary | ICD-10-CM | POA: Diagnosis not present

## 2019-08-04 DIAGNOSIS — R32 Unspecified urinary incontinence: Secondary | ICD-10-CM | POA: Diagnosis not present

## 2019-08-04 DIAGNOSIS — Z9181 History of falling: Secondary | ICD-10-CM | POA: Diagnosis not present

## 2019-08-04 DIAGNOSIS — J329 Chronic sinusitis, unspecified: Secondary | ICD-10-CM | POA: Diagnosis not present

## 2019-08-04 DIAGNOSIS — I872 Venous insufficiency (chronic) (peripheral): Secondary | ICD-10-CM | POA: Diagnosis not present

## 2019-08-04 DIAGNOSIS — Z85828 Personal history of other malignant neoplasm of skin: Secondary | ICD-10-CM | POA: Diagnosis not present

## 2019-08-04 DIAGNOSIS — Z993 Dependence on wheelchair: Secondary | ICD-10-CM | POA: Diagnosis not present

## 2019-08-04 DIAGNOSIS — N183 Chronic kidney disease, stage 3 unspecified: Secondary | ICD-10-CM | POA: Diagnosis not present

## 2019-08-04 DIAGNOSIS — Z8673 Personal history of transient ischemic attack (TIA), and cerebral infarction without residual deficits: Secondary | ICD-10-CM | POA: Diagnosis not present

## 2019-08-04 DIAGNOSIS — Z8701 Personal history of pneumonia (recurrent): Secondary | ICD-10-CM | POA: Diagnosis not present

## 2019-08-04 DIAGNOSIS — R05 Cough: Secondary | ICD-10-CM | POA: Diagnosis not present

## 2019-08-06 DIAGNOSIS — I872 Venous insufficiency (chronic) (peripheral): Secondary | ICD-10-CM | POA: Diagnosis not present

## 2019-08-06 DIAGNOSIS — I5032 Chronic diastolic (congestive) heart failure: Secondary | ICD-10-CM | POA: Diagnosis not present

## 2019-08-06 DIAGNOSIS — Z85828 Personal history of other malignant neoplasm of skin: Secondary | ICD-10-CM | POA: Diagnosis not present

## 2019-08-06 DIAGNOSIS — E039 Hypothyroidism, unspecified: Secondary | ICD-10-CM | POA: Diagnosis not present

## 2019-08-06 DIAGNOSIS — N183 Chronic kidney disease, stage 3 unspecified: Secondary | ICD-10-CM | POA: Diagnosis not present

## 2019-08-06 DIAGNOSIS — R32 Unspecified urinary incontinence: Secondary | ICD-10-CM | POA: Diagnosis not present

## 2019-08-06 DIAGNOSIS — I130 Hypertensive heart and chronic kidney disease with heart failure and stage 1 through stage 4 chronic kidney disease, or unspecified chronic kidney disease: Secondary | ICD-10-CM | POA: Diagnosis not present

## 2019-08-06 DIAGNOSIS — Z8673 Personal history of transient ischemic attack (TIA), and cerebral infarction without residual deficits: Secondary | ICD-10-CM | POA: Diagnosis not present

## 2019-08-06 DIAGNOSIS — Z7982 Long term (current) use of aspirin: Secondary | ICD-10-CM | POA: Diagnosis not present

## 2019-08-06 DIAGNOSIS — Z4789 Encounter for other orthopedic aftercare: Secondary | ICD-10-CM | POA: Diagnosis not present

## 2019-08-06 DIAGNOSIS — Z8701 Personal history of pneumonia (recurrent): Secondary | ICD-10-CM | POA: Diagnosis not present

## 2019-08-06 DIAGNOSIS — Z9181 History of falling: Secondary | ICD-10-CM | POA: Diagnosis not present

## 2019-08-06 DIAGNOSIS — Z993 Dependence on wheelchair: Secondary | ICD-10-CM | POA: Diagnosis not present

## 2019-08-06 DIAGNOSIS — I2510 Atherosclerotic heart disease of native coronary artery without angina pectoris: Secondary | ICD-10-CM | POA: Diagnosis not present

## 2019-08-11 ENCOUNTER — Ambulatory Visit: Payer: HMO

## 2019-08-19 DIAGNOSIS — Z7982 Long term (current) use of aspirin: Secondary | ICD-10-CM | POA: Diagnosis not present

## 2019-08-19 DIAGNOSIS — I130 Hypertensive heart and chronic kidney disease with heart failure and stage 1 through stage 4 chronic kidney disease, or unspecified chronic kidney disease: Secondary | ICD-10-CM | POA: Diagnosis not present

## 2019-08-19 DIAGNOSIS — N183 Chronic kidney disease, stage 3 unspecified: Secondary | ICD-10-CM | POA: Diagnosis not present

## 2019-08-19 DIAGNOSIS — I5032 Chronic diastolic (congestive) heart failure: Secondary | ICD-10-CM | POA: Diagnosis not present

## 2019-08-19 DIAGNOSIS — I2510 Atherosclerotic heart disease of native coronary artery without angina pectoris: Secondary | ICD-10-CM | POA: Diagnosis not present

## 2019-08-19 DIAGNOSIS — Z85828 Personal history of other malignant neoplasm of skin: Secondary | ICD-10-CM | POA: Diagnosis not present

## 2019-08-19 DIAGNOSIS — E039 Hypothyroidism, unspecified: Secondary | ICD-10-CM | POA: Diagnosis not present

## 2019-08-19 DIAGNOSIS — Z4789 Encounter for other orthopedic aftercare: Secondary | ICD-10-CM | POA: Diagnosis not present

## 2019-08-19 DIAGNOSIS — Z993 Dependence on wheelchair: Secondary | ICD-10-CM | POA: Diagnosis not present

## 2019-08-19 DIAGNOSIS — I872 Venous insufficiency (chronic) (peripheral): Secondary | ICD-10-CM | POA: Diagnosis not present

## 2019-08-19 DIAGNOSIS — Z9181 History of falling: Secondary | ICD-10-CM | POA: Diagnosis not present

## 2019-08-19 DIAGNOSIS — Z8673 Personal history of transient ischemic attack (TIA), and cerebral infarction without residual deficits: Secondary | ICD-10-CM | POA: Diagnosis not present

## 2019-08-19 DIAGNOSIS — Z8701 Personal history of pneumonia (recurrent): Secondary | ICD-10-CM | POA: Diagnosis not present

## 2019-08-19 DIAGNOSIS — R32 Unspecified urinary incontinence: Secondary | ICD-10-CM | POA: Diagnosis not present

## 2019-08-20 ENCOUNTER — Ambulatory Visit (INDEPENDENT_AMBULATORY_CARE_PROVIDER_SITE_OTHER): Payer: HMO | Admitting: Podiatry

## 2019-08-20 ENCOUNTER — Other Ambulatory Visit: Payer: Self-pay

## 2019-08-20 DIAGNOSIS — S86311S Strain of muscle(s) and tendon(s) of peroneal muscle group at lower leg level, right leg, sequela: Secondary | ICD-10-CM

## 2019-08-20 DIAGNOSIS — Z9889 Other specified postprocedural states: Secondary | ICD-10-CM

## 2019-08-24 DIAGNOSIS — Z7982 Long term (current) use of aspirin: Secondary | ICD-10-CM | POA: Diagnosis not present

## 2019-08-24 DIAGNOSIS — Z8673 Personal history of transient ischemic attack (TIA), and cerebral infarction without residual deficits: Secondary | ICD-10-CM | POA: Diagnosis not present

## 2019-08-24 DIAGNOSIS — Z4789 Encounter for other orthopedic aftercare: Secondary | ICD-10-CM | POA: Diagnosis not present

## 2019-08-24 DIAGNOSIS — R32 Unspecified urinary incontinence: Secondary | ICD-10-CM | POA: Diagnosis not present

## 2019-08-24 DIAGNOSIS — N183 Chronic kidney disease, stage 3 unspecified: Secondary | ICD-10-CM | POA: Diagnosis not present

## 2019-08-24 DIAGNOSIS — I130 Hypertensive heart and chronic kidney disease with heart failure and stage 1 through stage 4 chronic kidney disease, or unspecified chronic kidney disease: Secondary | ICD-10-CM | POA: Diagnosis not present

## 2019-08-24 DIAGNOSIS — I872 Venous insufficiency (chronic) (peripheral): Secondary | ICD-10-CM | POA: Diagnosis not present

## 2019-08-24 DIAGNOSIS — I5032 Chronic diastolic (congestive) heart failure: Secondary | ICD-10-CM | POA: Diagnosis not present

## 2019-08-24 DIAGNOSIS — Z8701 Personal history of pneumonia (recurrent): Secondary | ICD-10-CM | POA: Diagnosis not present

## 2019-08-24 DIAGNOSIS — Z9181 History of falling: Secondary | ICD-10-CM | POA: Diagnosis not present

## 2019-08-24 DIAGNOSIS — I2510 Atherosclerotic heart disease of native coronary artery without angina pectoris: Secondary | ICD-10-CM | POA: Diagnosis not present

## 2019-08-24 DIAGNOSIS — E039 Hypothyroidism, unspecified: Secondary | ICD-10-CM | POA: Diagnosis not present

## 2019-08-24 DIAGNOSIS — Z85828 Personal history of other malignant neoplasm of skin: Secondary | ICD-10-CM | POA: Diagnosis not present

## 2019-08-24 DIAGNOSIS — Z993 Dependence on wheelchair: Secondary | ICD-10-CM | POA: Diagnosis not present

## 2019-09-08 DIAGNOSIS — J019 Acute sinusitis, unspecified: Secondary | ICD-10-CM | POA: Diagnosis not present

## 2019-09-17 ENCOUNTER — Other Ambulatory Visit: Payer: Self-pay

## 2019-09-17 ENCOUNTER — Ambulatory Visit: Payer: HMO | Admitting: Podiatry

## 2019-09-17 ENCOUNTER — Encounter: Payer: Self-pay | Admitting: Podiatry

## 2019-09-17 DIAGNOSIS — S86311S Strain of muscle(s) and tendon(s) of peroneal muscle group at lower leg level, right leg, sequela: Secondary | ICD-10-CM

## 2019-09-17 DIAGNOSIS — I872 Venous insufficiency (chronic) (peripheral): Secondary | ICD-10-CM

## 2019-09-17 DIAGNOSIS — S86311D Strain of muscle(s) and tendon(s) of peroneal muscle group at lower leg level, right leg, subsequent encounter: Secondary | ICD-10-CM

## 2019-09-17 NOTE — Progress Notes (Signed)
Subjective:  Patient ID: Jill Preston, female    DOB: Dec 25, 1932,  MRN: [id number]  Chief Complaint  Patient presents with  . Routine Post Op    povDOS 05/27/2019 REPAIR PERONEAL TENDON VS. REPAIR TENDON RT, pt is currently able to walk, pt also states that the right foot has bruises, that she would like to have looked at   DOS: 05/27/2019 Procedure: Right Peroneal tendon repair x2  85 y.o. female returns for post-op check. Hx as above. Doing well but having pain in her surgical shoe in the heel. Complains of swelling and redness in the legs.  Review of Systems: Negative except as noted in the HPI. Denies N/V/F/Ch.  Past Medical History:  Diagnosis Date  . Arthritis    right knee  . Cancer [facility]) yrs ago   skin cancer removed from face  . Chronic diastolic CHF (congestive heart failure) ([place])   . Chronic venous insufficiency    LEA VENOUS, 10/17/2011 - mild reflux in bilateral common femoral veins  . CKD (chronic kidney disease), stage III   . Coronary artery disease    a. s/p PCI/BMS to prox LAD and balloon angioplasty to mLAD with suboptimal result in 2000. b. Abnl nuc 2012, cath 09/2011 - showed that the overall territory of potential ischemia was small and attributable to a moderately diseased small second diagonal artery. Med rx.  Marland Kitchen Dyspnea    with activity  . Headache   . History of blood transfusion 2017  . Hyperlipidemia   . Hypertension   . Hypertensive heart disease   . Hypothyroidism   . Obesity   . Pneumonia    several times  . Stroke [facility])    pt. states she had "light stroke" in sept. 1980  . Urinary incontinence     Current Outpatient Medications:  .  amLODipine (NORVASC) 2.5 MG tablet, Take 1 tablet (2.5 mg total) by mouth daily., Disp: 180 tablet, Rfl: 3 .  aspirin EC 81 MG tablet, Take 1 tablet (81 mg total) by mouth daily., Disp: 90 tablet, Rfl: 3 .  azithromycin (ZITHROMAX) 250 MG tablet, USE UTD., Disp: , Rfl:  .  bisacodyl (DULCOLAX) 5 MG EC  tablet, Take 10 mg by mouth daily. , Disp: , Rfl:  .  brompheniramine-pseudoephedrine-DM 30-2-10 MG/5ML syrup, Take 5 mLs by mouth 3 (three) times daily as needed., Disp: , Rfl:  .  Cholecalciferol (VITAMIN D) 125 MCG (5000 UT) CAPS, Take 5,000 Units by mouth daily., Disp: , Rfl:  .  clindamycin (CLEOCIN) 300 MG capsule, Take 1 capsule (300 mg total) by mouth 2 (two) times daily., Disp: 14 capsule, Rfl: 0 .  clobetasol ointment (TEMOVATE) AB-123456789 %, Apply 1 application topically daily as needed (rash). , Disp: , Rfl:  .  diclofenac Sodium (VOLTAREN) 1 % GEL, diclofenac 1 % topical gel, Disp: , Rfl:  .  doxycycline (VIBRAMYCIN) 100 MG capsule, Take 100 mg by mouth 2 (two) times daily., Disp: , Rfl:  .  enoxaparin (LOVENOX) 30 MG/0.3ML injection, Inject 0.3 mLs (30 mg total) into the skin daily., Disp: 8.4 mL, Rfl: 0 .  ezetimibe (ZETIA) 10 MG tablet, Take 1 tablet (10 mg total) by mouth daily., Disp: 90 tablet, Rfl: 3 .  furosemide (LASIX) 80 MG tablet, Take 80 mg at 6 am and 80 mg at 1 pm (Patient taking differently: Take 80 mg by mouth 2 (two) times daily as needed for fluid. ), Disp: 180 tablet, Rfl: 3 .  HYDROmorphone (DILAUDID) 2 MG  tablet, Take 0.5 tablets (1 mg total) by mouth every 4 (four) hours as needed for severe pain., Disp: 20 tablet, Rfl: 0 .  isosorbide mononitrate (IMDUR) 30 MG 24 hr tablet, TAKE 2 TABLETS BY MOUTH TWICE DAILY. (Patient taking differently: Take 60 mg by mouth daily. ), Disp: 360 tablet, Rfl: 1 .  KLOR-CON M20 20 MEQ tablet, Take 2 tablets (40 mEq total) by mouth daily. (Patient taking differently: Take 20 mEq by mouth daily. ), Disp: 180 tablet, Rfl: 3 .  levothyroxine (SYNTHROID, LEVOTHROID) 112 MCG tablet, Take 112 mcg by mouth daily before breakfast. , Disp: , Rfl: 4 .  losartan-hydrochlorothiazide (HYZAAR) 100-12.5 MG tablet, TAKE 1 TABLET BY MOUTH EVERY DAY, Disp: 90 tablet, Rfl: 2 .  metolazone (ZAROXOLYN) 2.5 MG tablet, Take 1 tablet (2.5 mg total) by mouth as  needed. Take 2.5 mg 30 min before AM dose of Lasix (only take when instructed to do so by Dr. Sallyanne Kuster) (Patient taking differently: Take 2.5 mg by mouth daily as needed (weight over 218 lbs). ), Disp: 30 tablet, Rfl: 6 .  Multiple Vitamin (MULTIVITAMIN WITH MINERALS) TABS tablet, Take 2 tablets by mouth daily. Centrum 50+ , Disp: , Rfl:  .  nitrofurantoin (MACRODANTIN) 100 MG capsule, TK ONE C PO  BID, Disp: , Rfl:  .  nitroGLYCERIN (NITROSTAT) 0.4 MG SL tablet, Place 1 tablet (0.4 mg total) under the tongue every 5 (five) minutes as needed for chest pain., Disp: 25 tablet, Rfl: 2 .  nystatin (MYCOSTATIN/NYSTOP) powder, Apply 1 Bottle topically daily as needed (rash). , Disp: , Rfl:  .  oxyCODONE-acetaminophen (PERCOCET) 10-325 MG tablet, Take 1 tablet by mouth every 4 (four) hours as needed for pain., Disp: 20 tablet, Rfl: 0 .  Potassium Chloride ER 20 MEQ TBCR, , Disp: , Rfl:  .  pravastatin (PRAVACHOL) 80 MG tablet, Take 80 mg by mouth at bedtime., Disp: , Rfl:  .  vitamin C (ASCORBIC ACID) 500 MG tablet, Take 1,000 mg by mouth daily. , Disp: , Rfl:   Social History   Tobacco Use  Smoking Status Never Smoker  Smokeless Tobacco Never Used    Allergies  Allergen Reactions  . Atorvastatin Anaphylaxis    Patient does not remember; caused pneumonia- took her off of it per patient.   . Penicillins Anaphylaxis and Hives    PATIENT HAS HAD A PCN REACTION WITH IMMEDIATE RASH, FACIAL/TONGUE/THROAT SWELLING, SOB, OR LIGHTHEADEDNESS WITH HYPOTENSION:  #  #  #  YES  #  #  #   HAS PT DEVELOPED SEVERE RASH INVOLVING MUCUS MEMBRANES or SKIN NECROSIS: #  #  #  YES  #  #  #   PATIENT HAS HAD A PCN REACTION THAT REQUIRED HOSPITALIZATION:  #  #  #  YES  #  #  #  Has patient had a PCN reaction occurring within the last 10 years:#  #  #  YES  #  #  #    . Shellfish Allergy Anaphylaxis and Nausea And Vomiting    Severe nausea and vomiting  . Aminophylline Itching, Swelling and Rash    Pt experienced  burning urination, itching, redness/rash, and swelling of genitals after given this medication via IV push.  . Iodinated Diagnostic Agents Swelling and Other (See Comments)    SWELLING REACTION UNSPECIFIED  fFLUSHING  . Latex Other (See Comments)    Causes blisters  . Oxybutynin Chloride Other (See Comments)    "blisters"  . Vancomycin Hives  06/02/2018- Hives arm, back and chest  . Adhesive [Tape] Rash    Paper tape is ok  . Betadine [Povidone Iodine] Rash  . Ciprofloxacin Hives  . Codeine Nausea And Vomiting    .  Marland Kitchen Ranolazine Other (See Comments)    Constipation  .   Objective:   There were no vitals filed for this visit. There is no height or weight on file to calculate BMI. Constitutional Well developed. Well nourished.  Vascular Foot warm and well perfused. Capillary refill normal to all digits.   Neurologic Normal speech. Oriented to person, place, and time. Epicritic sensation to light touch grossly present bilaterally.  Dermatologic Skin well healed. Bruising medlal left leg.   Orthopedic: No tenderness to palpation noted about the surgical site.  Good ROM without pain. Pain to palpation deep both calves.   Radiographs: None Assessment:   1. Tear of peroneal tendon, right, sequela   2. Tear of peroneal tendon, right, subsequent encounter   3. Venous (peripheral) insufficiency    Plan:  Patient was evaluated and treated and all questions answered.  S/p foot surgery right -Continue PT, start WBAT in normal shoe. -Order venous duplex US to rule out DVT bilat.  Return in about 4 weeks (around 10/15/2019) for Post-Op (No XRs).

## 2019-09-20 NOTE — Progress Notes (Signed)
Subjective:  Patient ID: Jill Preston, female    DOB: [birth year],  MRN: [id number]  Chief Complaint  Patient presents with  . Routine Post Op    DOS 9.9.2020 REPAIR PERONEAL TENDON VS. REPAIR TENDON RT. Pt states healing well, but did have a sharp pain in her heel during physical therapy yesterday "my leg just gave out". Pt states currently no pain and is feeling better overall.   DOS: 05/27/2019 Procedure: Right Peroneal tendon repair x2  86 y.o. female returns for post-op check. Hx as above.  History above confirmed with patient  Review of Systems: Negative except as noted in the HPI. Denies N/V/F/Ch.  Past Medical History:  Diagnosis Date  . Arthritis    right knee  . Cancer [facility]) yrs ago   skin cancer removed from face  . Chronic diastolic CHF (congestive heart failure) ([place])   . Chronic venous insufficiency    LEA VENOUS, 10/17/2011 - mild reflux in bilateral common femoral veins  . CKD (chronic kidney disease), stage III   . Coronary artery disease    a. s/p PCI/BMS to prox LAD and balloon angioplasty to mLAD with suboptimal result in 2000. b. Abnl nuc 2012, cath 09/2011 - showed that the overall territory of potential ischemia was small and attributable to a moderately diseased small second diagonal artery. Med rx.  Marland Kitchen Dyspnea    with activity  . Headache   . History of blood transfusion 2017  . Hyperlipidemia   . Hypertension   . Hypertensive heart disease   . Hypothyroidism   . Obesity   . Pneumonia    several times  . Stroke [facility])    pt. states she had "light stroke" in sept. 1980  . Urinary incontinence     Current Outpatient Medications:  .  amLODipine (NORVASC) 2.5 MG tablet, Take 1 tablet (2.5 mg total) by mouth daily., Disp: 180 tablet, Rfl: 3 .  aspirin EC 81 MG tablet, Take 1 tablet (81 mg total) by mouth daily., Disp: 90 tablet, Rfl: 3 .  azithromycin (ZITHROMAX) 250 MG tablet, USE UTD., Disp: , Rfl:  .  bisacodyl (DULCOLAX) 5 MG EC tablet, Take 10  mg by mouth daily. , Disp: , Rfl:  .  brompheniramine-pseudoephedrine-DM 30-2-10 MG/5ML syrup, Take 5 mLs by mouth 3 (three) times daily as needed., Disp: , Rfl:  .  Cholecalciferol (VITAMIN D) 125 MCG (5000 UT) CAPS, Take 5,000 Units by mouth daily., Disp: , Rfl:  .  clindamycin (CLEOCIN) 300 MG capsule, Take 1 capsule (300 mg total) by mouth 2 (two) times daily., Disp: 14 capsule, Rfl: 0 .  clobetasol ointment (TEMOVATE) AB-123456789 %, Apply 1 application topically daily as needed (rash). , Disp: , Rfl:  .  diclofenac Sodium (VOLTAREN) 1 % GEL, diclofenac 1 % topical gel, Disp: , Rfl:  .  enoxaparin (LOVENOX) 30 MG/0.3ML injection, Inject 0.3 mLs (30 mg total) into the skin daily., Disp: 8.4 mL, Rfl: 0 .  ezetimibe (ZETIA) 10 MG tablet, Take 1 tablet (10 mg total) by mouth daily., Disp: 90 tablet, Rfl: 3 .  furosemide (LASIX) 80 MG tablet, Take 80 mg at 6 am and 80 mg at 1 pm (Patient taking differently: Take 80 mg by mouth 2 (two) times daily as needed for fluid. ), Disp: 180 tablet, Rfl: 3 .  HYDROmorphone (DILAUDID) 2 MG tablet, Take 0.5 tablets (1 mg total) by mouth every 4 (four) hours as needed for severe pain., Disp: 20 tablet, Rfl: 0 .  isosorbide mononitrate (IMDUR) 30 MG 24 hr tablet, TAKE 2 TABLETS BY MOUTH TWICE DAILY. (Patient taking differently: Take 60 mg by mouth daily. ), Disp: 360 tablet, Rfl: 1 .  KLOR-CON M20 20 MEQ tablet, Take 2 tablets (40 mEq total) by mouth daily. (Patient taking differently: Take 20 mEq by mouth daily. ), Disp: 180 tablet, Rfl: 3 .  levothyroxine (SYNTHROID, LEVOTHROID) 112 MCG tablet, Take 112 mcg by mouth daily before breakfast. , Disp: , Rfl: 4 .  losartan-hydrochlorothiazide (HYZAAR) 100-12.5 MG tablet, TAKE 1 TABLET BY MOUTH EVERY DAY, Disp: 90 tablet, Rfl: 2 .  metolazone (ZAROXOLYN) 2.5 MG tablet, Take 1 tablet (2.5 mg total) by mouth as needed. Take 2.5 mg 30 min before AM dose of Lasix (only take when instructed to do so by Dr. Sallyanne Kuster) (Patient taking  differently: Take 2.5 mg by mouth daily as needed (weight over 218 lbs). ), Disp: 30 tablet, Rfl: 6 .  Multiple Vitamin (MULTIVITAMIN WITH MINERALS) TABS tablet, Take 2 tablets by mouth daily. Centrum 50+ , Disp: , Rfl:  .  nitrofurantoin (MACRODANTIN) 100 MG capsule, TK ONE C PO  BID, Disp: , Rfl:  .  nitroGLYCERIN (NITROSTAT) 0.4 MG SL tablet, Place 1 tablet (0.4 mg total) under the tongue every 5 (five) minutes as needed for chest pain., Disp: 25 tablet, Rfl: 2 .  nystatin (MYCOSTATIN/NYSTOP) powder, Apply 1 Bottle topically daily as needed (rash). , Disp: , Rfl:  .  oxyCODONE-acetaminophen (PERCOCET) 10-325 MG tablet, Take 1 tablet by mouth every 4 (four) hours as needed for pain., Disp: 20 tablet, Rfl: 0 .  Potassium Chloride ER 20 MEQ TBCR, , Disp: , Rfl:  .  pravastatin (PRAVACHOL) 80 MG tablet, Take 80 mg by mouth at bedtime., Disp: , Rfl:  .  vitamin C (ASCORBIC ACID) 500 MG tablet, Take 1,000 mg by mouth daily. , Disp: , Rfl:  .  doxycycline (VIBRAMYCIN) 100 MG capsule, Take 100 mg by mouth 2 (two) times daily., Disp: , Rfl:   Social History   Tobacco Use  Smoking Status Never Smoker  Smokeless Tobacco Never Used    Allergies  Allergen Reactions  . Atorvastatin Anaphylaxis    Patient does not remember; caused pneumonia- took her off of it per patient.   . Penicillins Anaphylaxis and Hives    PATIENT HAS HAD A PCN REACTION WITH IMMEDIATE RASH, FACIAL/TONGUE/THROAT SWELLING, SOB, OR LIGHTHEADEDNESS WITH HYPOTENSION:  #  #  #  YES  #  #  #   HAS PT DEVELOPED SEVERE RASH INVOLVING MUCUS MEMBRANES or SKIN NECROSIS: #  #  #  YES  #  #  #   PATIENT HAS HAD A PCN REACTION THAT REQUIRED HOSPITALIZATION:  #  #  #  YES  #  #  #  Has patient had a PCN reaction occurring within the last 10 years:#  #  #  YES  #  #  #    . Shellfish Allergy Anaphylaxis and Nausea And Vomiting    Severe nausea and vomiting  . Aminophylline Itching, Swelling and Rash    Pt experienced burning urination,  itching, redness/rash, and swelling of genitals after given this medication via IV push.  . Iodinated Diagnostic Agents Swelling and Other (See Comments)    SWELLING REACTION UNSPECIFIED  fFLUSHING  . Latex Other (See Comments)    Causes blisters  . Oxybutynin Chloride Other (See Comments)    "blisters"  . Vancomycin Hives    06/02/2018- Hives arm,  back and chest  . Adhesive [Tape] Rash    Paper tape is ok  . Betadine [Povidone Iodine] Rash  . Ciprofloxacin Hives  . Codeine Nausea And Vomiting    .  Marland Kitchen Ranolazine Other (See Comments)    Constipation  .   Objective:   There were no vitals filed for this visit. There is no height or weight on file to calculate BMI. Constitutional Well developed. Well nourished.  Vascular Foot warm and well perfused. Capillary refill normal to all digits.   Neurologic Normal speech. Oriented to person, place, and time. Epicritic sensation to light touch grossly present bilaterally.  Dermatologic Skin well healed.  Orthopedic: No tenderness to palpation noted about the surgical site.  Slight pain in the heel but good peroneal strength   Radiographs: None Assessment:   1. Post-operative state   2. Tear of peroneal tendon, right, sequela    Plan:  Patient was evaluated and treated and all questions answered.  S/p foot surgery right -Transition to surgical shoe.  Heel pad placed in the back of the shoe.  Follow-up in 1 month for recheck.  Return in about 1 month (around 09/20/2019) for Post-Op (No XRs).

## 2019-09-22 ENCOUNTER — Other Ambulatory Visit: Payer: Self-pay

## 2019-09-22 ENCOUNTER — Ambulatory Visit (HOSPITAL_COMMUNITY)
Admission: RE | Admit: 2019-09-22 | Discharge: 2019-09-22 | Disposition: A | Discharge status: Home / Self Care | Payer: HMO | Source: Ambulatory Visit | Attending: Podiatry | Admitting: Podiatry

## 2019-09-22 DIAGNOSIS — I872 Venous insufficiency (chronic) (peripheral): Secondary | ICD-10-CM | POA: Insufficient documentation

## 2019-09-23 ENCOUNTER — Telehealth: Payer: Self-pay | Admitting: *Deleted

## 2019-09-23 NOTE — Telephone Encounter (Signed)
Jill Preston, DPM  Harriett Sine D, RN  Can you call her and let her know the [place] did not show any clots?

## 2019-09-23 NOTE — Telephone Encounter (Signed)
I informed pt of Dr. Eleanora Neighbor review of results and she states she still has swelling in the ankle. I told pt that she would have swelling from the surgery for a while. Pt states Dr. March Rummage wanted her to get into an athletic shoe and she can't get it tied. I told pt that she should wear the boot out of the house, but during the day at the house wear the shoe until it was uncomfortable then go back in to the boot and wear in until comfort and gradually increase. I told pt wear the compression anklet beginning before getting out of bed, and when begins to swell wear the ace wrap to help train the swelling to stay out of the leg.

## 2019-10-15 ENCOUNTER — Telehealth: Payer: Self-pay | Admitting: *Deleted

## 2019-10-15 ENCOUNTER — Ambulatory Visit (INDEPENDENT_AMBULATORY_CARE_PROVIDER_SITE_OTHER): Payer: HMO | Admitting: Podiatry

## 2019-10-15 ENCOUNTER — Other Ambulatory Visit: Payer: Self-pay

## 2019-10-15 DIAGNOSIS — M5442 Lumbago with sciatica, left side: Secondary | ICD-10-CM | POA: Diagnosis not present

## 2019-10-15 DIAGNOSIS — M25552 Pain in left hip: Secondary | ICD-10-CM | POA: Diagnosis not present

## 2019-10-15 DIAGNOSIS — M545 Low back pain: Secondary | ICD-10-CM | POA: Diagnosis not present

## 2019-10-15 DIAGNOSIS — I872 Venous insufficiency (chronic) (peripheral): Secondary | ICD-10-CM | POA: Diagnosis not present

## 2019-10-15 DIAGNOSIS — E782 Mixed hyperlipidemia: Secondary | ICD-10-CM | POA: Diagnosis not present

## 2019-10-15 DIAGNOSIS — I10 Essential (primary) hypertension: Secondary | ICD-10-CM | POA: Diagnosis not present

## 2019-10-15 DIAGNOSIS — S86311S Strain of muscle(s) and tendon(s) of peroneal muscle group at lower leg level, right leg, sequela: Secondary | ICD-10-CM

## 2019-10-15 NOTE — Progress Notes (Signed)
Subjective:  Patient ID: Jill Preston, female    DOB: 1933-07-03,  MRN: [id number]  Chief Complaint  Patient presents with  . Routine Post Op    Pt states surgical foot is doing great, no concerns and is healing well.   DOS: 05/27/2019 Procedure: Right Peroneal tendon repair x2  86 y.o. female returns for post-op check. Hx as above. Doing well but having pain in her surgical shoe in the heel. Complains of swelling and redness in the legs.   Review of Systems: Negative except as noted in the HPI. Denies N/V/F/Ch.  Past Medical History:  Diagnosis Date  . Arthritis    right knee  . Cancer [facility]) yrs ago   skin cancer removed from face  . Chronic diastolic CHF (congestive heart failure) ([place])   . Chronic venous insufficiency    LEA VENOUS, 10/17/2011 - mild reflux in bilateral common femoral veins  . CKD (chronic kidney disease), stage III   . Coronary artery disease    a. s/p PCI/BMS to prox LAD and balloon angioplasty to mLAD with suboptimal result in 2000. b. Abnl nuc 2012, cath 09/2011 - showed that the overall territory of potential ischemia was small and attributable to a moderately diseased small second diagonal artery. Med rx.  Marland Kitchen Dyspnea    with activity  . Headache   . History of blood transfusion 2017  . Hyperlipidemia   . Hypertension   . Hypertensive heart disease   . Hypothyroidism   . Obesity   . Pneumonia    several times  . Stroke [facility])    pt. states she had "light stroke" in sept. 1980  . Urinary incontinence     Current Outpatient Medications:  .  amLODipine (NORVASC) 2.5 MG tablet, Take 1 tablet (2.5 mg total) by mouth daily., Disp: 180 tablet, Rfl: 3 .  aspirin EC 81 MG tablet, Take 1 tablet (81 mg total) by mouth daily., Disp: 90 tablet, Rfl: 3 .  azithromycin (ZITHROMAX) 250 MG tablet, USE UTD., Disp: , Rfl:  .  bisacodyl (DULCOLAX) 5 MG EC tablet, Take 10 mg by mouth daily. , Disp: , Rfl:  .  brompheniramine-pseudoephedrine-DM 30-2-10 MG/5ML  syrup, Take 5 mLs by mouth 3 (three) times daily as needed., Disp: , Rfl:  .  Cholecalciferol (VITAMIN D) 125 MCG (5000 UT) CAPS, Take 5,000 Units by mouth daily., Disp: , Rfl:  .  clindamycin (CLEOCIN) 300 MG capsule, Take 1 capsule (300 mg total) by mouth 2 (two) times daily., Disp: 14 capsule, Rfl: 0 .  clobetasol ointment (TEMOVATE) AB-123456789 %, Apply 1 application topically daily as needed (rash). , Disp: , Rfl:  .  diclofenac Sodium (VOLTAREN) 1 % GEL, diclofenac 1 % topical gel, Disp: , Rfl:  .  doxycycline (VIBRAMYCIN) 100 MG capsule, Take 100 mg by mouth 2 (two) times daily., Disp: , Rfl:  .  enoxaparin (LOVENOX) 30 MG/0.3ML injection, Inject 0.3 mLs (30 mg total) into the skin daily., Disp: 8.4 mL, Rfl: 0 .  ezetimibe (ZETIA) 10 MG tablet, Take 1 tablet (10 mg total) by mouth daily., Disp: 90 tablet, Rfl: 3 .  furosemide (LASIX) 80 MG tablet, Take 80 mg at 6 am and 80 mg at 1 pm (Patient taking differently: Take 80 mg by mouth 2 (two) times daily as needed for fluid. ), Disp: 180 tablet, Rfl: 3 .  HYDROmorphone (DILAUDID) 2 MG tablet, Take 0.5 tablets (1 mg total) by mouth every 4 (four) hours as needed for severe pain.,  Disp: 20 tablet, Rfl: 0 .  isosorbide mononitrate (IMDUR) 30 MG 24 hr tablet, TAKE 2 TABLETS BY MOUTH TWICE DAILY. (Patient taking differently: Take 60 mg by mouth daily. ), Disp: 360 tablet, Rfl: 1 .  KLOR-CON M20 20 MEQ tablet, Take 2 tablets (40 mEq total) by mouth daily. (Patient taking differently: Take 20 mEq by mouth daily. ), Disp: 180 tablet, Rfl: 3 .  levothyroxine (SYNTHROID, LEVOTHROID) 112 MCG tablet, Take 112 mcg by mouth daily before breakfast. , Disp: , Rfl: 4 .  losartan-hydrochlorothiazide (HYZAAR) 100-12.5 MG tablet, TAKE 1 TABLET BY MOUTH EVERY DAY, Disp: 90 tablet, Rfl: 2 .  methylPREDNISolone (MEDROL DOSEPAK) 4 MG TBPK tablet, See admin instructions. follow package directions, Disp: , Rfl:  .  metolazone (ZAROXOLYN) 2.5 MG tablet, Take 1 tablet (2.5 mg  total) by mouth as needed. Take 2.5 mg 30 min before AM dose of Lasix (only take when instructed to do so by Dr. Sallyanne Kuster) (Patient taking differently: Take 2.5 mg by mouth daily as needed (weight over 218 lbs). ), Disp: 30 tablet, Rfl: 6 .  Multiple Vitamin (MULTIVITAMIN WITH MINERALS) TABS tablet, Take 2 tablets by mouth daily. Centrum 50+ , Disp: , Rfl:  .  nitrofurantoin (MACRODANTIN) 100 MG capsule, TK ONE C PO  BID, Disp: , Rfl:  .  nitroGLYCERIN (NITROSTAT) 0.4 MG SL tablet, Place 1 tablet (0.4 mg total) under the tongue every 5 (five) minutes as needed for chest pain., Disp: 25 tablet, Rfl: 2 .  nystatin (MYCOSTATIN/NYSTOP) powder, Apply 1 Bottle topically daily as needed (rash). , Disp: , Rfl:  .  oxyCODONE-acetaminophen (PERCOCET) 10-325 MG tablet, Take 1 tablet by mouth every 4 (four) hours as needed for pain., Disp: 20 tablet, Rfl: 0 .  Potassium Chloride ER 20 MEQ TBCR, , Disp: , Rfl:  .  pravastatin (PRAVACHOL) 80 MG tablet, Take 80 mg by mouth at bedtime., Disp: , Rfl:  .  vitamin C (ASCORBIC ACID) 500 MG tablet, Take 1,000 mg by mouth daily. , Disp: , Rfl:   Social History   Tobacco Use  Smoking Status Never Smoker  Smokeless Tobacco Never Used    Allergies  Allergen Reactions  . Atorvastatin Anaphylaxis    Patient does not remember; caused pneumonia- took her off of it per patient.   . Penicillins Anaphylaxis and Hives    PATIENT HAS HAD A PCN REACTION WITH IMMEDIATE RASH, FACIAL/TONGUE/THROAT SWELLING, SOB, OR LIGHTHEADEDNESS WITH HYPOTENSION:  #  #  #  YES  #  #  #   HAS PT DEVELOPED SEVERE RASH INVOLVING MUCUS MEMBRANES or SKIN NECROSIS: #  #  #  YES  #  #  #   PATIENT HAS HAD A PCN REACTION THAT REQUIRED HOSPITALIZATION:  #  #  #  YES  #  #  #  Has patient had a PCN reaction occurring within the last 10 years:#  #  #  YES  #  #  #    . Shellfish Allergy Anaphylaxis and Nausea And Vomiting    Severe nausea and vomiting  . Aminophylline Itching, Swelling and Rash     Pt experienced burning urination, itching, redness/rash, and swelling of genitals after given this medication via IV push.  . Iodinated Diagnostic Agents Swelling and Other (See Comments)    SWELLING REACTION UNSPECIFIED  fFLUSHING  . Latex Other (See Comments)    Causes blisters  . Oxybutynin Chloride Other (See Comments)    "blisters"  . Vancomycin Hives  06/02/2018- Hives arm, back and chest  . Adhesive [Tape] Rash    Paper tape is ok  . Betadine [Povidone Iodine] Rash  . Ciprofloxacin Hives  . Codeine Nausea And Vomiting    .  Marland Kitchen Ranolazine Other (See Comments)    Constipation  .   Objective:   There were no vitals filed for this visit. There is no height or weight on file to calculate BMI. Constitutional Well developed. Well nourished.  Vascular Foot warm and well perfused. Capillary refill normal to all digits.   Neurologic Normal speech. Oriented to person, place, and time. Epicritic sensation to light touch grossly present bilaterally.  Dermatologic Skin well healed.    Orthopedic: No tenderness to palpation noted about the surgical site.  Good ROM without pain.    Radiographs: None Assessment:   1. Tear of peroneal tendon, right, sequela   2. Venous (peripheral) insufficiency    Plan:  Patient was evaluated and treated and all questions answered.  S/p foot surgery right -Doing very well, no pain or concerns RLE -She is having significant right hip pain. I do think she would benefit from seeing Dr. Arnoldo Morale. Our RN helped facilitate her getting an appt.  F/u in 6 weeks for recheck.  No follow-ups on file.

## 2019-10-15 NOTE — Telephone Encounter (Signed)
[facility] and [place] transferred to [facility] to schedule with Dr. Arnoldo Morale before I could tell her pt would like to see 1st available doctor. Left message on Becky's voicemail to call pt with 1st available doctor to care for the severe left hip pain, and was crying in the office with Dr. March Rummage. I asked that pt be contacted and leave me a message with pt's appt.

## 2019-10-16 NOTE — Telephone Encounter (Signed)
Dr Arnoldo Morale office called [facility] to schedule

## 2019-10-16 NOTE — Telephone Encounter (Signed)
Becky from Dr. Arnoldo Morale office called to give an update.  Dr. Arnoldo Morale office called patient to schedule appt and was informed by pt that she had gone to see Dr. Ashby Dawes. She had an x-ray and was given medication and told to follow up with him in two weeks. If pt did not have any improvement she stated she would give them a call back to schedule.

## 2019-10-17 ENCOUNTER — Emergency Department (HOSPITAL_COMMUNITY): Payer: HMO

## 2019-10-17 ENCOUNTER — Other Ambulatory Visit: Payer: Self-pay

## 2019-10-17 ENCOUNTER — Emergency Department (HOSPITAL_COMMUNITY)
Admission: EM | Admit: 2019-10-17 | Discharge: 2019-10-17 | Disposition: A | Discharge status: Home / Self Care | Payer: HMO | Attending: Emergency Medicine | Admitting: Emergency Medicine

## 2019-10-17 DIAGNOSIS — I5032 Chronic diastolic (congestive) heart failure: Secondary | ICD-10-CM | POA: Insufficient documentation

## 2019-10-17 DIAGNOSIS — I4430 Unspecified atrioventricular block: Secondary | ICD-10-CM | POA: Diagnosis not present

## 2019-10-17 DIAGNOSIS — I2510 Atherosclerotic heart disease of native coronary artery without angina pectoris: Secondary | ICD-10-CM | POA: Insufficient documentation

## 2019-10-17 DIAGNOSIS — Z79899 Other long term (current) drug therapy: Secondary | ICD-10-CM | POA: Diagnosis not present

## 2019-10-17 DIAGNOSIS — J9811 Atelectasis: Secondary | ICD-10-CM | POA: Diagnosis not present

## 2019-10-17 DIAGNOSIS — I959 Hypotension, unspecified: Secondary | ICD-10-CM | POA: Diagnosis not present

## 2019-10-17 DIAGNOSIS — R0781 Pleurodynia: Secondary | ICD-10-CM | POA: Diagnosis not present

## 2019-10-17 DIAGNOSIS — Z91040 Latex allergy status: Secondary | ICD-10-CM | POA: Insufficient documentation

## 2019-10-17 DIAGNOSIS — I130 Hypertensive heart and chronic kidney disease with heart failure and stage 1 through stage 4 chronic kidney disease, or unspecified chronic kidney disease: Secondary | ICD-10-CM | POA: Diagnosis not present

## 2019-10-17 DIAGNOSIS — R001 Bradycardia, unspecified: Secondary | ICD-10-CM | POA: Diagnosis not present

## 2019-10-17 DIAGNOSIS — Z7982 Long term (current) use of aspirin: Secondary | ICD-10-CM | POA: Diagnosis not present

## 2019-10-17 DIAGNOSIS — R0989 Other specified symptoms and signs involving the circulatory and respiratory systems: Secondary | ICD-10-CM | POA: Diagnosis not present

## 2019-10-17 DIAGNOSIS — Z8673 Personal history of transient ischemic attack (TIA), and cerebral infarction without residual deficits: Secondary | ICD-10-CM | POA: Diagnosis not present

## 2019-10-17 DIAGNOSIS — R0902 Hypoxemia: Secondary | ICD-10-CM | POA: Diagnosis not present

## 2019-10-17 DIAGNOSIS — R52 Pain, unspecified: Secondary | ICD-10-CM | POA: Diagnosis not present

## 2019-10-17 DIAGNOSIS — E039 Hypothyroidism, unspecified: Secondary | ICD-10-CM | POA: Insufficient documentation

## 2019-10-17 DIAGNOSIS — N183 Chronic kidney disease, stage 3 unspecified: Secondary | ICD-10-CM | POA: Diagnosis not present

## 2019-10-17 DIAGNOSIS — H538 Other visual disturbances: Secondary | ICD-10-CM | POA: Diagnosis not present

## 2019-10-17 DIAGNOSIS — R918 Other nonspecific abnormal finding of lung field: Secondary | ICD-10-CM | POA: Diagnosis not present

## 2019-10-17 DIAGNOSIS — I440 Atrioventricular block, first degree: Secondary | ICD-10-CM | POA: Diagnosis not present

## 2019-10-17 DIAGNOSIS — I10 Essential (primary) hypertension: Secondary | ICD-10-CM | POA: Diagnosis not present

## 2019-10-17 NOTE — Discharge Instructions (Signed)
Please take your medications as prescribed.  Please follow-up with your primary care doctor for reevaluation.  Please return to ED if any new concerning symptoms.

## 2019-10-17 NOTE — ED Notes (Signed)
Patient verbalizes understanding of discharge instructions. Opportunity for questioning and answers were provided. Armband removed by staff, pt discharged from ED.  

## 2019-10-17 NOTE — ED Triage Notes (Addendum)
Pt arrives via ems due to having fluctuating blood pressure's at home. Per ems, the patient was c/o of issues with her bp and right rib pain below her breast (worsens with deep breaths and movement). Patient reports that her bp was 80/40 this morning.   On the scene, her bp was 180/110.   Patient reports no pain at this time, only with movement. States that she checks her bp daily for primary care recording.

## 2019-10-18 NOTE — ED Provider Notes (Signed)
[facility] EMERGENCY DEPARTMENT Provider Note   CSN: [id number] Arrival date & time: 10/17/19  1831     History Chief Complaint  Patient presents with  . Fluctuating BP/Right Rib pain    Jill Preston is a 86 y.o. female  HPI  Patient is an 86 year old female with significant past medical history including coronary artery disease, CHF, hypertension, hyperlipidemia, obesity, TIA presented today for right-sided rib pain.  Patient states that she checks her blood pressure numerous times each day and keeps a log.  She states that this morning at approximately 930 she knows her blood pressures seem to be fluctuating and notes that her blood pressure is as low as 80/40.  This concerned her and she called EMS who came to her house and checked her blood pressure.  Although patient is uncertain when her blood pressure was at that time she tells me that EMS recommended that she call again if she have any symptoms that concerned her.  Patient describes her rib pain as sharp, not pleuritic or exertional.  She states it is nonradiating.  It feels that she has pain in her rib cage.  Denies any aggravating or mitigating factors.  Patient states that approximately 5:30 PM this evening she noticed a sharp pain in her right rib as well as her right back.  She states that she does not member hurting the area but she has also been using a wheelchair since a ankle surgery and states that sometimes she has to lean over her wheelchair to get objects.  She states it hurts when she pushes on that area she has tried no medications for the pain but immediately called EMS as they had recommended that she call for any unusual symptoms.  She was transported to ED at that time.  Patient states that she takes her medications in the morning.  Since she has been in the ED her blood pressures have been unremarkable although mildly elevated.     Past Medical History:  Diagnosis Date  . Arthritis    right knee  . Cancer [facility]) yrs ago   skin cancer removed from face  . Chronic diastolic CHF (congestive heart failure) ([place])   . Chronic venous insufficiency    LEA VENOUS, 10/17/2011 - mild reflux in bilateral common femoral veins  . CKD (chronic kidney disease), stage III   . Coronary artery disease    a. s/p PCI/BMS to prox LAD and balloon angioplasty to mLAD with suboptimal result in 2000. b. Abnl nuc 2012, cath 09/2011 - showed that the overall territory of potential ischemia was small and attributable to a moderately diseased small second diagonal artery. Med rx.  Marland Kitchen Dyspnea    with activity  . Headache   . History of blood transfusion 2017  . Hyperlipidemia   . Hypertension   . Hypertensive heart disease   . Hypothyroidism   . Obesity   . Pneumonia    several times  . Stroke [facility])    pt. states she had "light stroke" in sept. 1980  . Urinary incontinence     Patient Active Problem List   Diagnosis Date Noted  . Peroneal tendon tear, right, sequela   . PAD (peripheral artery disease) ([place]) 08/01/2018  . Herniated intervertebral disc of lumbar spine 06/02/2018  . Tear of left rotator cuff 10/29/2017  . Bilateral leg pain 01/27/2017  . Cervical spine degeneration 10/23/2015  . Hypersomnolence 10/23/2015  . S/P IVC filter 02/15/2015  . Angina  pectoris ([place]) 01/26/2015  . CAD in native artery   . Ischemic chest pain ([place])   . Edema of left lower extremity 09/16/2014  . Acute thromboembolism of deep veins of lower extremity ([place]) 09/14/2014  . Acute deep vein thrombosis of left lower extremity ([place]) 09/14/2014  . Anemia associated with acute blood loss 09/14/2014  . CHF (congestive heart failure) ([place]) 09/05/2014  . Subclavian artery stenosis, left ([place]) 08/04/2014  . Chronic diastolic CHF (congestive heart failure) ([place]) 07/16/2014  . Essential hypertension 07/08/2014  . CKD (chronic kidney disease), stage III   . Hypertensive heart disease   . Obesity, Class II, BMI  35-39.9, with comorbidity   . Chronic venous insufficiency   . Chronic diastolic heart failure ([place]) 07/04/2014  . Metatarsal deformity 06/24/2013  . CAD - LAD stent 12/00. Patent '02 and 1/13, moderate D2 managed medically 02/26/2013  . Dyslipidemia 02/26/2013  . DJD (degenerative joint disease)- back 02/26/2013  . Vaginal candidiasis 10/23/2012  . Asthma 08/06/2012  . Hyperlipidemia 08/05/2012  . Lichen sclerosus [id number]  . Rectocele 06/03/2012  . Recurrent UTI 06/03/2012  . Urge urinary incontinence 06/03/2012  . Voiding dysfunction 06/03/2012  . Hypothyroidism 07/23/2011  . Accelerated hypertension 07/23/2011    Past Surgical History:  Procedure Laterality Date  . ABDOMINAL HYSTERECTOMY  1970's   complete  . ANKLE ARTHROSCOPY Right 07/10/2017   Procedure: ANKLE ARTHROSCOPY;  Surgeon: [name], DPM;  Location: [facility];  Service: Podiatry;  Laterality: Right;  . BACK SURGERY  07/26/2016   cervical neck surgery, [facility]  . BLADDER SURGERY  2010   WITH MESH  bladder tach  . bunion removal surgery Bilateral 15 yrs ago  . CARDIAC CATHETERIZATION Left 09/25/2011   Medical management  . CARDIAC CATHETERIZATION Left 03/25/2001   Normal LV function, LAD residual narrowing of less than 10%, normal ramus intermediate, circumflex, and RCA,   . CARDIAC CATHETERIZATION  09/04/1999   LAD, 3x12mm Tetra stent resulting in a reduction of the 80% stenosis to 0% residual  . CARDIAC CATHETERIZATION N/A 01/26/2015   Procedure: Right/Left Heart Cath and Coronary Angiography;  Surgeon: Troy Sine, MD;  Location: [facility];  Service: Cardiovascular;  Laterality: N/A;  . CARDIAC CATHETERIZATION N/A 01/27/2015   Procedure: Intravascular Pressure Wire/FFR Study;  Surgeon: Burnell Blanks, MD;  Location: [facility];  Service: Cardiovascular;  Laterality: N/A;  . CARDIAC CATHETERIZATION N/A 01/27/2015   Procedure: Right Heart Cath;  Surgeon: Burnell Blanks, MD;  Location: [facility];  Service: Cardiovascular;  Laterality: N/A;  . CHOLECYSTECTOMY    . COLONOSCOPY WITH PROPOFOL N/A 03/07/2017   Procedure: COLONOSCOPY WITH PROPOFOL;  Surgeon: Juanita Craver, MD;  Location: [facility];  Service: Endoscopy;  Laterality: N/A;  . CORONARY STENT PLACEMENT     LAD x 1  . ganglion cyst removal  yrs ago   x 2  . ILIAC VEIN ANGIOPLASTY / STENTING  02/15/2015  . INTRAVASCULAR PRESSURE WIRE/FFR STUDY N/A 04/05/2017   Procedure: Intravascular Pressure Wire/FFR Study;  Surgeon: Martinique, Peter M, MD;  Location: [facility];  Service: Cardiovascular;  Laterality: N/A;  . IVC FILTER INSERTION  2016  . IVC FILTER REMOVAL  02/15/2015   at [facility]  . LEFT HEART CATH AND CORONARY ANGIOGRAPHY N/A 04/05/2017   Procedure: Left Heart Cath and Coronary Angiography;  Surgeon: Martinique, Peter M, MD;  Location: [facility];  Service: Cardiovascular;  Laterality: N/A;  .  LEFT HEART CATHETERIZATION WITH CORONARY ANGIOGRAM N/A 09/25/2011   Procedure: LEFT HEART CATHETERIZATION WITH CORONARY ANGIOGRAM;  Surgeon: Sanda Klein, MD;  Location: [facility] CATH LAB;  Service: Cardiovascular;  Laterality: N/A;  . multiple bladder surgeries to remove mesh    . ROTATOR CUFF REPAIR Right   . stent to groin Left 08/2014   left leg  . TENDON REPAIR Right 07/10/2017   Procedure: RIGHT PERONEAL TENDON REPAIR;  Surgeon: [name], DPM;  Location: [facility];  Service: Podiatry;  Laterality: Right;  . TENDON REPAIR Right 05/27/2019   Procedure: PERONEAL TENDON REPAIR x2;  Surgeon: [name], DPM;  Location: [facility];  Service: Podiatry;  Laterality: Right;     OB History   No obstetric history on file.     Family History  Problem Relation Age of Onset  . Cancer Mother   . Heart disease Father   . Heart disease Sister   . Heart disease Brother     Social History   Tobacco Use  . Smoking status: Never Smoker  . Smokeless tobacco: Never Used  Substance Use  Topics  . Alcohol use: No  . Drug use: No    Home Medications Prior to Admission medications   Medication Sig Start Date End Date Taking? Authorizing Provider  acetaminophen (TYLENOL) 500 MG tablet Take 1,000 mg by mouth at bedtime.   Yes [provider]  Ascorbic Acid (VITAMIN C) 1000 MG tablet Take 1,000 mg by mouth daily.    Yes [provider]  aspirin EC 81 MG tablet Take 1 tablet (81 mg total) by mouth daily. 02/26/13  Yes Kilroy, Luke K, PA-C  bisacodyl (DULCOLAX) 5 MG EC tablet Take 10 mg by mouth daily.    Yes [provider]  cholecalciferol (VITAMIN D3) 25 MCG (1000 UNIT) tablet Take 1,000 Units by mouth daily.    Yes [provider]  clobetasol ointment (TEMOVATE) [id number] % Apply 1 application topically daily as needed (rash).  10/08/18  Yes [provider]  ezetimibe (ZETIA) 10 MG tablet Take 1 tablet (10 mg total) by mouth daily. 12/25/18  Yes Croitoru, Mihai, MD  furosemide (LASIX) 80 MG tablet Take 80 mg at 6 am and 80 mg at 1 pm Patient taking differently: Take 80 mg by mouth daily.  12/19/18  Yes Croitoru, Mihai, MD  isosorbide mononitrate (IMDUR) 30 MG 24 hr tablet TAKE 2 TABLETS BY MOUTH TWICE DAILY. Patient taking differently: Take 60 mg by mouth daily.  05/14/19  Yes Croitoru, Mihai, MD  KLOR-CON M20 20 MEQ tablet Take 2 tablets (40 mEq total) by mouth daily. Patient taking differently: Take 20 mEq by mouth daily.  12/19/18  Yes Croitoru, Mihai, MD  levothyroxine (SYNTHROID, LEVOTHROID) 112 MCG tablet Take 112 mcg by mouth daily before breakfast.  05/15/18  Yes [provider]  metolazone (ZAROXOLYN) 2.5 MG tablet Take 1 tablet (2.5 mg total) by mouth as needed. Take 2.5 mg 30 min before AM dose of Lasix (only take when instructed to do so by Dr. Sallyanne Kuster) Patient taking differently: Take 2.5 mg by mouth daily as needed (when instructed to do so by Dr. Sallyanne Kuster).  02/03/19  Yes Croitoru, Mihai, MD  Multiple Vitamins-Minerals  (HAIR/SKIN/NAILS/BIOTIN PO) Take 2 tablets by mouth daily.   Yes [provider]  nitroGLYCERIN (NITROSTAT) 0.4 MG SL tablet Place 1 tablet (0.4 mg total) under the tongue every 5 (five) minutes as needed for chest pain. 11/15/16  Yes Croitoru, Dani Gobble, MD  nystatin (MYCOSTATIN/NYSTOP) powder Apply 1 application topically daily.  07/28/18  Yes [provider]  OVER THE COUNTER MEDICATION Take 1 Scoop by mouth daily. "Ruby Red" - mix in fluid and drink   Yes [provider]  OVER THE COUNTER MEDICATION Take 1 tablet by mouth daily. "Focus factor"   Yes [provider]  oxyCODONE-acetaminophen (PERCOCET/ROXICET) 5-325 MG tablet Take 1-2 tablets by mouth every 6 (six) hours as needed (pain).  10/15/19  Yes [provider]  pravastatin (PRAVACHOL) 80 MG tablet Take 80 mg by mouth daily.    Yes [provider]  predniSONE (DELTASONE) 10 MG tablet Take 10-40 mg by mouth See admin instructions. Tapered course started 10/15/2019: take 4 tablets (40 mg) by mouth daily for 3 days, then take 3 tablets (30 mg) daily for 3 days, then take 2 tablets (20 mg) daily for 3 days, then take 1 tablet (10 mg) daily for 3 days, then stop 10/15/19  Yes [provider]  tiZANidine (ZANAFLEX) 4 MG tablet Take 4 mg by mouth 3 (three) times daily as needed for muscle spasms.  10/15/19  Yes [provider]  amLODipine (NORVASC) 2.5 MG tablet Take 1 tablet (2.5 mg total) by mouth daily. Patient not taking: Reported on 10/17/2019 05/14/18 10/15/19  Barrett, Evelene Croon, PA-C  losartan-hydrochlorothiazide (HYZAAR) 100-12.5 MG tablet TAKE 1 TABLET BY MOUTH EVERY DAY Patient not taking: Reported on 10/17/2019 10/08/18   Croitoru, Mihai, MD  oxyCODONE-acetaminophen (PERCOCET) 10-325 MG tablet Take 1 tablet by mouth every 4 (four) hours as needed for pain. Patient not taking: Reported on 10/17/2019 05/27/19   [name], DPM    Allergies    Atorvastatin, Penicillins,  Shellfish allergy, Aminophylline, Iodinated diagnostic agents, Latex, Oxybutynin chloride, Vancomycin, Adhesive [tape], Betadine [povidone iodine], Ciprofloxacin, Codeine, and Ranolazine  Review of Systems   Review of Systems  Constitutional: Negative for chills and fever.  HENT: Negative for congestion.   Eyes: Negative for pain.  Respiratory: Negative for cough and shortness of breath.   Cardiovascular: Negative for chest pain and leg swelling.  Gastrointestinal: Negative for abdominal pain and vomiting.  Genitourinary: Negative for dysuria.  Musculoskeletal: Negative for myalgias.       Rib pain (RT)  Skin: Negative for rash.  Neurological: Negative for dizziness and headaches.    Physical Exam Updated Vital Signs BP (!) 177/87   Pulse 72   Temp 97.7 F (36.5 C) (Oral)   Resp 20   Ht 5' 5.5" (1.664 m)   Wt 97.1 kg   SpO2 98%   BMI 35.07 kg/m   Physical Exam Vitals and nursing note reviewed.  Constitutional:      General: She is not in acute distress.    Comments: No acute distress, 86 year old female, talkative and pleasant and able to answer questions and follow commands appropriately  HENT:     Head: Normocephalic and atraumatic.     Nose: Nose normal.  Eyes:     General: No scleral icterus. Cardiovascular:     Rate and Rhythm: Normal rate and regular rhythm.     Pulses: Normal pulses.     Heart sounds: Normal heart sounds.  Pulmonary:     Effort: Pulmonary effort is normal. No respiratory distress.     Breath sounds: No wheezing.     Comments: Speaking in full sentences, no increased work of breathing, normal respiratory rate, SPO2 98% on room air Abdominal:     Palpations: Abdomen is soft.  Tenderness: There is no abdominal tenderness.  Musculoskeletal:     Cervical back: Normal range of motion.     Right lower leg: Edema present.     Left lower leg: Edema present.     Comments: Trace bilateral lower extremity edema unchanged from patient's baseline  per patient  Tenderness palpation over right eighth and ninth rib.  Right-sided parathoracic muscular tenderness to palpation that reproduces pain.  Light palpation elicits patient to jump with discomfort.  Skin:    General: Skin is warm and dry.     Capillary Refill: Capillary refill takes less than 2 seconds.  Neurological:     Mental Status: She is alert. Mental status is at baseline.  Psychiatric:        Mood and Affect: Mood normal.        Behavior: Behavior normal.     ED Results / Procedures / Treatments   Labs (all labs ordered are listed, but only abnormal results are displayed) Labs Reviewed - No data to display  EKG None  Radiology DG Chest 2 View  Result Date: 10/17/2019 CLINICAL DATA:  Dizziness. EXAM: CHEST - 2 VIEW COMPARISON:  October 17, 2019 FINDINGS: Stable mild to moderate severity increased interstitial lung markings are seen. Very mild areas of linear atelectasis are seen within the bilateral lung bases. This is very mildly decreased in severity when compared to the prior study. A cluster of small calcified right hilar lymph nodes is seen. There is no evidence of a pleural effusion or pneumothorax. The cardiac silhouette is mildly enlarged. A radiopaque fusion plate and screws are seen overlying the lower cervical spine. Degenerative changes seen throughout the thoracic spine. IMPRESSION: 1. Very mild bibasilar linear atelectasis, very mildly decreased in severity when compared to the prior study dated October 17, 2019 (acquired at 8:15 p.m.). Electronically Signed   By: Virgina Norfolk M.D.   On: 10/17/2019 21:18   DG Chest Portable 1 View  Result Date: 10/17/2019 CLINICAL DATA:  Pain. EXAM: PORTABLE CHEST 1 VIEW COMPARISON:  December 27, 2017 FINDINGS: No pneumothorax. Mild opacity in the bases. Rounded density in the right perihilar region. No pneumothorax. Stable cardiomegaly. No other abnormalities. IMPRESSION: 1. The rounded density in the right perihilar  region could represent 1 of the hilar vessels. A true nodule is not excluded. Recommend a PA and lateral chest x-ray before discharge for better evaluation. 2. Mild bibasilar opacities may represent atelectasis or developing infection. Recommend clinical correlation. Electronically Signed   By: Dorise Bullion III M.D   On: 10/17/2019 20:33    Procedures Procedures (including critical care time)  Medications Ordered in ED Medications - No data to display  ED Course  I have reviewed the triage vital signs and the nursing notes.  Pertinent labs & imaging results that were available during my care of the patient were reviewed by me and considered in my medical decision making (see chart for details).    MDM Rules/Calculators/A&P                      Patient is 86 year old female presented today with rib and back pain which is reproducible with palpation nonpleuritic and not exertional.  Patient denies any chest pain shortness of breath diaphoresis fevers or chills she has not been coughing.  Considered pulmonary embolism, pneumonia, rib fracture, ACS and thoracic aortic dissection.  Her symptoms are mild and her pain is easily elicited with even light palpation.  This seems to be  a musculoskeletal issue and she is neither tachypneic hypoxic or tachycardic or experiencing any pleuritic chest pain to indicate a PE.  Today to evaluate for any fracture or pulmonary pathology.  Small indiscriminate area that may be questionable pulmonary nodule seen on portable chest x-ray.  AP and lateral recommended and obtained.  This showed no acute disease.  I recommend the patient follow-up with another chest x-ray in 1 month to evaluate for resolution of questionable nodule.  I discussed with patient pros and cons of doing a more thorough work-up and expressed my concern that this is a musculoskeletal injury.  She is agreeable with plan to discharge and have patient follow-up with her primary care doctor who  she sees regularly.  Patient given strict return cautions.  I discussed this case with my attending physician who cosigned this note including patient's presenting symptoms, physical exam, and planned diagnostics and interventions. Attending physician stated agreement with plan or made changes to plan which were implemented.   Attending physician assessed patient at bedside.  I stated earlier musculoskeletal cause of patient's symptoms is most likely however there may be a anxiety component as patient seems to be checking her blood pressure numerous times during the day on a daily basis and describing symptoms to her blood pressure that are likely unrelated.   Jill Preston was evaluated in Emergency Department on 10/18/2019 for the symptoms described in the history of present illness. She was evaluated in the context of the global COVID-19 pandemic, which necessitated consideration that the patient might be at risk for infection with the SARS-CoV-2 virus that causes COVID-19. Institutional protocols and algorithms that pertain to the evaluation of patients at risk for COVID-19 are in a state of rapid change based on information released by regulatory bodies including the CDC and federal and state organizations. These policies and algorithms were followed during the patient's care in the ED. The medical records were personally reviewed by myself. I personally reviewed all lab results and interpreted all imaging studies and either concurred with their official read or contacted radiology for clarification.   This patient appears reasonably screened and I doubt any other medical condition requiring further workup, evaluation, or treatment in the ED at this time prior to discharge.   Patient's vitals are WNL apart from vital sign abnormalities discussed above, patient is in NAD.  Pain has been managed or a plan has been made for home management and has no complaints prior to discharge. Patient is  comfortable with above plan and for discharge at this time. All questions were answered prior to disposition. Results from the ER workup discussed with the patient face to face and all questions answered to the best of my ability. The patient is safe for discharge with strict return precautions. Patient appears safe for discharge with appropriate follow-up. Conveyed my impression with the patient and they voiced understanding and are agreeable to plan.   An After Visit Summary was printed and given to the patient.  Portions of this note were generated with [employer]. Dictation errors may occur despite best attempts at proofreading.    Final Clinical Impression(s) / ED Diagnoses Final diagnoses:  Rib pain on right side    Rx / DC Orders ED Discharge Orders    None       Tedd Sias, [place] 10/18/19 1013    Milton Ferguson, MD 10/19/19 [phone]

## 2019-10-19 ENCOUNTER — Other Ambulatory Visit: Payer: Self-pay

## 2019-10-19 NOTE — Patient Outreach (Signed)
[facility]) Care Management Chronic Special Needs Program  10/19/2019  Name: Jill Preston DOB: [birth year]  MRN: [id number]  Jill Preston is enrolled in a chronic special needs plan for Heart Failure. Chronic Care Management Coordinator telephoned client to review health risk assessment and to develop individualized care plan.  Reviewed the chronic care management program, importance of client participation, and taking their care plan to all provider appointments and inpatient facilities.  Reviewed the transition of care process and possible referral to community care management.  Subjective: Client states that she went to the ER this weekend because she had a reaction to some of her medications that made her B/P drop to 80/60.  States she also had a pain in her rt ribs.  States that pain is better now.  States she has a call to her primary care doctor to follow up from ER visit.  States she has been getting better from her ft surgery and she had been doing therapy.  Denies any falls.  States she has been weighting daily and writing in her [employer](HTA) calendar.  States she is short of breath if she moves too much but it is her normal.  Denies any change in her leg swelling.  States she is to see Dr. Sallyanne Kuster on 11/18/19.  States she is following a low sodium diet.    Goals Addressed            This Visit's Progress   . Client understands the importance of follow-up with providers by attending scheduled visits   On track    Plan to keep scheduled appointments with providers    . Client will increase activity tolerance by  reported less SOB within 6 months-continue 10/19/19   No change    Progress activity slowly    . Client will not report change from baseline and no repeated symptoms of stroke with in the next 12 months    On track    Reports on stroke symptoms Reviewed stoke symptoms and when to call 911    . Client will report no worsening of  symptoms related to heart disease within the next 6 months continue 04/06/19   On track    Call Dr. Sallyanne Kuster for weight gain of 3 lbs in one day or 5 lbs in one week Signs and symptoms of heart failure reviewed Advised to notify doctor for symptoms Call 911 for severe symptoms Weigh daily and record weighs Follow a low salt diet    . Client will verbalize knowledge of self management of Hypertension as evidences by BP reading of 140/90 or less; or as defined by provider   On track    Reports B/P below 140/90 when checking daily Plan to check B/P regularly Take B/P medications as ordered Plan to follow a low salt diet  Increase activity as tolerated    . Decrease inpatient Heart Failure admissions/ readmissions with in the year   On track    No heart failure admissions last year    . Decrease the use of hospital emergency department related to heart failure within the next year   On track    No ED visits related to heart failure    . Maintain timely refills of Heart Failure medication as prescribed within the year    On track    Maintaining timely refills of medications per dispense report Reinforced importance of getting medications refilled on time    . Obtain annual  Lipid Profile, LDL-C   On track    Completed 11/04/18 Plan to avoid saturated fats and trans-fats      . Patient Stated to do chair exercises 2 times a week       Plan to do upper body chair exercises 2 times a week as tolerated EMMI Upper body seated exercises assigned     . Visit Primary Care Provider or Cardiologist at least 2 times per year   On track    Visits 02/10/19, 05/13/19 Plan to keep appointment with cardiology on 11/18/19 Please schedule your annual wellness visit     Client verbalizes good teach back on s/s of heart failure to call her cardiologist Client is scheduled to see cardiology on 11/18/19 Clients goal is to get stronger and agrees to work on doing chair exercises as tolerated Client with no  reported pharmacy issues and declines pharmacy referral at this time for polypharmacy  Plan:  Send successful outreach letter with a copy of their individualized care plan, Send individual care plan to provider and Send educational material-EMMI chair exercises  Chronic care management coordination will outreach in:  6 Months     Peter Garter RN, [employer], [place] Management Coordinator [place] [facility] [phone]

## 2019-10-20 ENCOUNTER — Other Ambulatory Visit: Payer: Self-pay | Admitting: *Deleted

## 2019-10-20 MED ORDER — AMLODIPINE BESYLATE 2.5 MG PO TABS: 2.5000 mg | ORAL_TABLET | Freq: Every day | ORAL | 1 refills | Status: DC

## 2019-10-20 NOTE — Telephone Encounter (Signed)
Rx has been sent to the pharmacy electronically. ° °

## 2019-10-22 ENCOUNTER — Emergency Department (HOSPITAL_COMMUNITY)
Admission: EM | Admit: 2019-10-22 | Discharge: 2019-10-22 | Disposition: A | Discharge status: Home / Self Care | Payer: HMO | Attending: Emergency Medicine | Admitting: Emergency Medicine

## 2019-10-22 ENCOUNTER — Other Ambulatory Visit: Payer: Self-pay

## 2019-10-22 ENCOUNTER — Emergency Department (HOSPITAL_COMMUNITY): Payer: HMO

## 2019-10-22 ENCOUNTER — Encounter (HOSPITAL_COMMUNITY): Payer: Self-pay

## 2019-10-22 DIAGNOSIS — N183 Chronic kidney disease, stage 3 unspecified: Secondary | ICD-10-CM | POA: Insufficient documentation

## 2019-10-22 DIAGNOSIS — I130 Hypertensive heart and chronic kidney disease with heart failure and stage 1 through stage 4 chronic kidney disease, or unspecified chronic kidney disease: Secondary | ICD-10-CM | POA: Diagnosis not present

## 2019-10-22 DIAGNOSIS — I5032 Chronic diastolic (congestive) heart failure: Secondary | ICD-10-CM | POA: Insufficient documentation

## 2019-10-22 DIAGNOSIS — Z7982 Long term (current) use of aspirin: Secondary | ICD-10-CM | POA: Diagnosis not present

## 2019-10-22 DIAGNOSIS — I10 Essential (primary) hypertension: Secondary | ICD-10-CM | POA: Diagnosis not present

## 2019-10-22 DIAGNOSIS — R109 Unspecified abdominal pain: Secondary | ICD-10-CM | POA: Diagnosis present

## 2019-10-22 DIAGNOSIS — Z79899 Other long term (current) drug therapy: Secondary | ICD-10-CM | POA: Diagnosis not present

## 2019-10-22 DIAGNOSIS — R52 Pain, unspecified: Secondary | ICD-10-CM | POA: Diagnosis not present

## 2019-10-22 DIAGNOSIS — E039 Hypothyroidism, unspecified: Secondary | ICD-10-CM | POA: Diagnosis not present

## 2019-10-22 DIAGNOSIS — Z91040 Latex allergy status: Secondary | ICD-10-CM | POA: Insufficient documentation

## 2019-10-22 DIAGNOSIS — M5442 Lumbago with sciatica, left side: Secondary | ICD-10-CM | POA: Diagnosis not present

## 2019-10-22 DIAGNOSIS — I959 Hypotension, unspecified: Secondary | ICD-10-CM | POA: Diagnosis not present

## 2019-10-22 DIAGNOSIS — R1011 Right upper quadrant pain: Secondary | ICD-10-CM

## 2019-10-22 DIAGNOSIS — R1084 Generalized abdominal pain: Secondary | ICD-10-CM | POA: Diagnosis not present

## 2019-10-22 LAB — COMPREHENSIVE METABOLIC PANEL
ALT: 38 U/L (ref 0–44)
AST: 60 U/L — ABNORMAL HIGH (ref 15–41)
Albumin: 3.9 g/dL (ref 3.5–5.0)
Alkaline Phosphatase: 178 U/L — ABNORMAL HIGH (ref 38–126)
Anion gap: 13 (ref 5–15)
BUN: 19 mg/dL (ref 8–23)
CO2: 31 mmol/L (ref 22–32)
Calcium: 9.9 mg/dL (ref 8.9–10.3)
Chloride: 99 mmol/L (ref 98–111)
Creatinine, Ser: 0.93 mg/dL (ref 0.44–1.00)
GFR calc Af Amer: >60 mL/min (ref >60)
GFR calc non Af Amer: 56 mL/min — ABNORMAL LOW (ref >60)
Glucose, Bld: 116 mg/dL — ABNORMAL HIGH (ref 70–99)
Potassium: 4.5 mmol/L (ref 3.5–5.1)
Sodium: 143 mmol/L (ref 135–145)
Total Bilirubin: 1.6 mg/dL — ABNORMAL HIGH (ref 0.3–1.2)
Total Protein: 7.2 g/dL (ref 6.5–8.1)

## 2019-10-22 LAB — CBC WITH DIFFERENTIAL/PLATELET
Abs Immature Granulocytes: 0.01 10*3/uL (ref 0.00–0.07)
Basophils Absolute: 0.1 10*3/uL (ref 0.0–0.1)
Basophils Relative: 1 %
Eosinophils Absolute: 0.3 10*3/uL (ref 0.0–0.5)
Eosinophils Relative: 4 %
HCT: 48.4 % — ABNORMAL HIGH (ref 36.0–46.0)
Hemoglobin: 15.7 g/dL — ABNORMAL HIGH (ref 12.0–15.0)
Immature Granulocytes: 0 %
Lymphocytes Relative: 25 %
Lymphs Abs: 1.7 10*3/uL (ref 0.7–4.0)
MCH: 32.7 pg (ref 26.0–34.0)
MCHC: 32.4 g/dL (ref 30.0–36.0)
MCV: 100.8 fL — ABNORMAL HIGH (ref 80.0–100.0)
Monocytes Absolute: 0.4 10*3/uL (ref 0.1–1.0)
Monocytes Relative: 7 %
Neutro Abs: 4.2 10*3/uL (ref 1.7–7.7)
Neutrophils Relative %: 63 %
Platelets: 160 10*3/uL (ref 150–400)
RBC: 4.8 MIL/uL (ref 3.87–5.11)
RDW: 13.6 % (ref 11.5–15.5)
WBC: 6.6 10*3/uL (ref 4.0–10.5)
nRBC: 0 % (ref 0.0–0.2)

## 2019-10-22 LAB — URINALYSIS, ROUTINE W REFLEX MICROSCOPIC
Bilirubin Urine: NEGATIVE
Glucose, UA: NEGATIVE mg/dL
Hgb urine dipstick: NEGATIVE
Ketones, ur: NEGATIVE mg/dL
Leukocytes,Ua: NEGATIVE
Nitrite: NEGATIVE
Protein, ur: NEGATIVE mg/dL
Specific Gravity, Urine: 1.004 — ABNORMAL LOW (ref 1.005–1.030)
pH: 8 (ref 5.0–8.0)

## 2019-10-22 LAB — LIPASE, BLOOD: Lipase: 30 U/L (ref 11–51)

## 2019-10-22 NOTE — ED Provider Notes (Signed)
[facility] DEPT Provider Note   CSN: [id number] Arrival date & time: 10/22/19  1216     History Chief Complaint  Patient presents with  . Abdominal Pain    Jill Preston is a 86 y.o. female.  HPI     Patient presents with right upper quadrant abdominal pain. Patient has multiple medical issues, including prior cholecystectomy. This episode began without clear precipitant about 6 days ago.  Since about that time the pain has been persistent, though with waxing, waning severity.  Pain is focally in the right upper quadrant, nonradiating, worse with motion of the right arm, bending, activity.  It is unclear if she has taken any medication for pain control.4:42 PM  Patient has ongoing issues including recovering from right lower extremity surgery, and had one episode of lightheadedness, with hypotension last weekend. However, today she states that she is otherwise generally well aside from ongoing pain in the right upper quadrant After seeing her physician, having tenderness in the area with palpation she was referred here for evaluation.  Past Medical History:  Diagnosis Date  . Arthritis    right knee  . Cancer [facility]) yrs ago   skin cancer removed from face  . Chronic diastolic CHF (congestive heart failure) ([place])   . Chronic venous insufficiency    LEA VENOUS, 10/17/2011 - mild reflux in bilateral common femoral veins  . CKD (chronic kidney disease), stage III   . Coronary artery disease    a. s/p PCI/BMS to prox LAD and balloon angioplasty to mLAD with suboptimal result in 2000. b. Abnl nuc 2012, cath 09/2011 - showed that the overall territory of potential ischemia was small and attributable to a moderately diseased small second diagonal artery. Med rx.  Marland Kitchen Dyspnea    with activity  . Headache   . History of blood transfusion 2017  . Hyperlipidemia   . Hypertension   . Hypertensive heart disease   . Hypothyroidism   . Obesity   .  Pneumonia    several times  . Stroke [facility])    pt. states she had "light stroke" in sept. 1980  . Urinary incontinence     Patient Active Problem List   Diagnosis Date Noted  . Peroneal tendon tear, right, sequela   . PAD (peripheral artery disease) ([place]) 08/01/2018  . Herniated intervertebral disc of lumbar spine 06/02/2018  . Tear of left rotator cuff 10/29/2017  . Bilateral leg pain 01/27/2017  . Cervical spine degeneration 10/23/2015  . Hypersomnolence 10/23/2015  . S/P IVC filter 02/15/2015  . Angina pectoris ([place]) 01/26/2015  . CAD in native artery   . Ischemic chest pain ([place])   . Edema of left lower extremity 09/16/2014  . Acute thromboembolism of deep veins of lower extremity ([place]) 09/14/2014  . Acute deep vein thrombosis of left lower extremity ([place]) 09/14/2014  . Anemia associated with acute blood loss 09/14/2014  . CHF (congestive heart failure) ([place]) 09/05/2014  . Subclavian artery stenosis, left ([place]) 08/04/2014  . Chronic diastolic CHF (congestive heart failure) ([place]) 07/16/2014  . Essential hypertension 07/08/2014  . CKD (chronic kidney disease), stage III   . Hypertensive heart disease   . Obesity, Class II, BMI 35-39.9, with comorbidity   . Chronic venous insufficiency   . Chronic diastolic heart failure ([place]) 07/04/2014  . Metatarsal deformity 06/24/2013  . CAD - LAD stent 12/00. Patent '02 and 1/13, moderate D2 managed medically 02/26/2013  . Dyslipidemia 02/26/2013  . DJD (degenerative joint  disease)- back 02/26/2013  . Vaginal candidiasis 10/23/2012  . Asthma 08/06/2012  . Hyperlipidemia 08/05/2012  . Lichen sclerosus [id number]  . Rectocele 06/03/2012  . Recurrent UTI 06/03/2012  . Urge urinary incontinence 06/03/2012  . Voiding dysfunction 06/03/2012  . Hypothyroidism 07/23/2011  . Accelerated hypertension 07/23/2011    Past Surgical History:  Procedure Laterality Date  . ABDOMINAL HYSTERECTOMY  1970's   complete  . ANKLE ARTHROSCOPY Right  07/10/2017   Procedure: ANKLE ARTHROSCOPY;  Surgeon: [name], DPM;  Location: [facility];  Service: Podiatry;  Laterality: Right;  . BACK SURGERY  07/26/2016   cervical neck surgery, [facility]  . BLADDER SURGERY  2010   WITH MESH  bladder tach  . bunion removal surgery Bilateral 15 yrs ago  . CARDIAC CATHETERIZATION Left 09/25/2011   Medical management  . CARDIAC CATHETERIZATION Left 03/25/2001   Normal LV function, LAD residual narrowing of less than 10%, normal ramus intermediate, circumflex, and RCA,   . CARDIAC CATHETERIZATION  09/04/1999   LAD, 3x70mm Tetra stent resulting in a reduction of the 80% stenosis to 0% residual  . CARDIAC CATHETERIZATION N/A 01/26/2015   Procedure: Right/Left Heart Cath and Coronary Angiography;  Surgeon: Troy Sine, MD;  Location: [facility] CV LAB;  Service: Cardiovascular;  Laterality: N/A;  . CARDIAC CATHETERIZATION N/A 01/27/2015   Procedure: Intravascular Pressure Wire/FFR Study;  Surgeon: Burnell Blanks, MD;  Location: [place];  Service: Cardiovascular;  Laterality: N/A;  . CARDIAC CATHETERIZATION N/A 01/27/2015   Procedure: Right Heart Cath;  Surgeon: Burnell Blanks, MD;  Location: [facility];  Service: Cardiovascular;  Laterality: N/A;  . CHOLECYSTECTOMY    . COLONOSCOPY WITH PROPOFOL N/A 03/07/2017   Procedure: COLONOSCOPY WITH PROPOFOL;  Surgeon: Juanita Craver, MD;  Location: [facility];  Service: Endoscopy;  Laterality: N/A;  . CORONARY STENT PLACEMENT     LAD x 1  . ganglion cyst removal  yrs ago   x 2  . ILIAC VEIN ANGIOPLASTY / STENTING  02/15/2015  . INTRAVASCULAR PRESSURE WIRE/FFR STUDY N/A 04/05/2017   Procedure: Intravascular Pressure Wire/FFR Study;  Surgeon: Martinique, Peter M, MD;  Location: [place] [facility];  Service: Cardiovascular;  Laterality: N/A;  . IVC FILTER INSERTION  2016  . IVC FILTER REMOVAL  02/15/2015   at [facility]  . LEFT HEART CATH AND CORONARY ANGIOGRAPHY N/A  04/05/2017   Procedure: Left Heart Cath and Coronary Angiography;  Surgeon: Martinique, Peter M, MD;  Location: [place];  Service: Cardiovascular;  Laterality: N/A;  . LEFT HEART CATHETERIZATION WITH CORONARY ANGIOGRAM N/A 09/25/2011   Procedure: LEFT HEART CATHETERIZATION WITH CORONARY ANGIOGRAM;  Surgeon: Sanda Klein, MD;  Location: [facility] CATH LAB;  Service: Cardiovascular;  Laterality: N/A;  . multiple bladder surgeries to remove mesh    . ROTATOR CUFF REPAIR Right   . stent to groin Left 08/2014   left leg  . TENDON REPAIR Right 07/10/2017   Procedure: RIGHT PERONEAL TENDON REPAIR;  Surgeon: [name], DPM;  Location: [facility];  Service: Podiatry;  Laterality: Right;  . TENDON REPAIR Right 05/27/2019   Procedure: PERONEAL TENDON REPAIR x2;  Surgeon: [name], DPM;  Location: [facility];  Service: Podiatry;  Laterality: Right;     OB History   No obstetric history on file.     Family History  Problem Relation Age of Onset  . Cancer Mother   . Heart disease Father   . Heart  disease Sister   . Heart disease Brother     Social History   Tobacco Use  . Smoking status: Never Smoker  . Smokeless tobacco: Never Used  Substance Use Topics  . Alcohol use: No  . Drug use: No    Home Medications Prior to Admission medications   Medication Sig Start Date End Date Taking? Authorizing Provider  acetaminophen (TYLENOL) 500 MG tablet Take 1,000 mg by mouth at bedtime.   Yes [provider]  amLODipine (NORVASC) 2.5 MG tablet Take 1 tablet (2.5 mg total) by mouth daily. 10/20/19 01/18/20 Yes Croitoru, Mihai, MD  Ascorbic Acid (VITAMIN C) 1000 MG tablet Take 1,000 mg by mouth daily.    Yes [provider]  aspirin EC 81 MG tablet Take 1 tablet (81 mg total) by mouth daily. 02/26/13  Yes Kilroy, Luke K, PA-C  bisacodyl (DULCOLAX) 5 MG EC tablet Take 10 mg by mouth daily.    Yes [provider]  cholecalciferol (VITAMIN D3) 25 MCG (1000 UNIT) tablet Take  1,000 Units by mouth daily.    Yes [provider]  clobetasol ointment (TEMOVATE) [id number] % Apply 1 application topically daily as needed (rash).  10/08/18  Yes [provider]  ezetimibe (ZETIA) 10 MG tablet Take 1 tablet (10 mg total) by mouth daily. 12/25/18  Yes Croitoru, Mihai, MD  furosemide (LASIX) 80 MG tablet Take 80 mg at 6 am and 80 mg at 1 pm Patient taking differently: Take 80 mg by mouth daily.  12/19/18  Yes Croitoru, Mihai, MD  isosorbide mononitrate (IMDUR) 30 MG 24 hr tablet TAKE 2 TABLETS BY MOUTH TWICE DAILY. Patient taking differently: Take 60 mg by mouth daily.  05/14/19  Yes Croitoru, Mihai, MD  KLOR-CON M20 20 MEQ tablet Take 2 tablets (40 mEq total) by mouth daily. Patient taking differently: Take 20 mEq by mouth daily.  12/19/18  Yes Croitoru, Mihai, MD  levothyroxine (SYNTHROID, LEVOTHROID) 112 MCG tablet Take 112 mcg by mouth daily before breakfast.  05/15/18  Yes [provider]  metolazone (ZAROXOLYN) 2.5 MG tablet Take 1 tablet (2.5 mg total) by mouth as needed. Take 2.5 mg 30 min before AM dose of Lasix (only take when instructed to do so by Dr. Sallyanne Kuster) Patient taking differently: Take 2.5 mg by mouth daily as needed (when instructed to do so by Dr. Sallyanne Kuster).  02/03/19  Yes Croitoru, Mihai, MD  Multiple Vitamins-Minerals (HAIR/SKIN/NAILS/BIOTIN PO) Take 2 tablets by mouth daily.   Yes [provider]  nitroGLYCERIN (NITROSTAT) 0.4 MG SL tablet Place 1 tablet (0.4 mg total) under the tongue every 5 (five) minutes as needed for chest pain. 11/15/16  Yes Croitoru, Mihai, MD  nystatin (MYCOSTATIN/NYSTOP) powder Apply 1 application topically daily as needed (rash).  07/28/18  Yes [provider]  OVER THE COUNTER MEDICATION Take 1 Scoop by mouth daily. "Ruby Red" - mix in fluid and drink   Yes [provider]  OVER THE COUNTER MEDICATION Take 1 tablet by mouth daily. "Focus factor"   Yes [provider]  pravastatin  (PRAVACHOL) 80 MG tablet Take 80 mg by mouth daily.    Yes [provider]  tiZANidine (ZANAFLEX) 4 MG tablet Take 4 mg by mouth 3 (three) times daily as needed for muscle spasms.  10/15/19  Yes [provider]  losartan-hydrochlorothiazide (HYZAAR) 100-12.5 MG tablet TAKE 1 TABLET BY MOUTH EVERY DAY Patient not taking: Reported on 10/17/2019 10/08/18   Croitoru, Dani Gobble, MD  oxyCODONE-acetaminophen (PERCOCET) [phone]  MG tablet Take 1 tablet by mouth every 4 (four) hours as needed for pain. Patient not taking: Reported on 10/17/2019 05/27/19   [name], DPM    Allergies    Atorvastatin, Penicillins, Shellfish allergy, Aminophylline, Iodinated diagnostic agents, Latex, Oxybutynin chloride, Vancomycin, Adhesive [tape], Betadine [povidone iodine], Ciprofloxacin, Codeine, and Ranolazine  Review of Systems   Review of Systems  Constitutional:       Per HPI, otherwise negative  HENT:       Per HPI, otherwise negative  Respiratory:       Per HPI, otherwise negative  Cardiovascular:       Per HPI, otherwise negative  Gastrointestinal: Positive for abdominal pain. Negative for vomiting.  Endocrine:       Negative aside from HPI  Genitourinary:       Neg aside from HPI   Musculoskeletal:       Per HPI, otherwise negative  Skin: Negative.   Neurological: Negative for syncope.    Physical Exam Updated Vital Signs BP (!) 173/75   Pulse 66   Resp 13   Ht 5\' 5"  (1.651 m)   Wt 96.2 kg   SpO2 94%   BMI 35.28 kg/m   Physical Exam Vitals and nursing note reviewed.  Constitutional:      General: She is not in acute distress.    Appearance: She is well-developed.  HENT:     Head: Normocephalic and atraumatic.  Eyes:     Conjunctiva/sclera: Conjunctivae normal.  Cardiovascular:     Rate and Rhythm: Normal rate and regular rhythm.  Pulmonary:     Effort: Pulmonary effort is normal. No respiratory distress.     Breath sounds: Normal breath sounds. No stridor.    Abdominal:     General: There is no distension.     Tenderness: There is abdominal tenderness. There is guarding. Positive signs include Murphy's sign.  Skin:    General: Skin is warm and dry.  Neurological:     Mental Status: She is alert and oriented to person, place, and time.     Cranial Nerves: No cranial nerve deficit.     ED Results / Procedures / Treatments   Labs (all labs ordered are listed, but only abnormal results are displayed) Labs Reviewed  CBC WITH DIFFERENTIAL/PLATELET - Abnormal; Notable for the following components:      Result Value   Hemoglobin 15.7 (*)    HCT 48.4 (*)    MCV 100.8 (*)    All other components within normal limits  COMPREHENSIVE METABOLIC PANEL - Abnormal; Notable for the following components:   Glucose, Bld 116 (*)    AST 60 (*)    Alkaline Phosphatase 178 (*)    Total Bilirubin 1.6 (*)    GFR calc non Af Amer 56 (*)    All other components within normal limits  URINALYSIS, ROUTINE W REFLEX MICROSCOPIC - Abnormal; Notable for the following components:   Color, Urine STRAW (*)    Specific Gravity, Urine 1.004 (*)    All other components within normal limits  LIPASE, BLOOD    EKG EKG Interpretation  Date/Time:  Thursday October 22 2019 12:45:34 EST Ventricular Rate:  73 PR Interval:    QRS Duration: 98 QT Interval:  408 QTC Calculation: 450 R Axis:   42 Text Interpretation: Sinus rhythm Nonspecific repol abnormality, lateral leads Baseline wander Artifact Abnormal ECG Confirmed by Carmin Muskrat [phone]) on 10/22/2019 12:53:12 PM   Radiology CT Abdomen Pelvis Wo Contrast  Result Date: 10/22/2019 CLINICAL DATA:  Right upper quadrant abdominal pain for 5 days EXAM: CT ABDOMEN AND PELVIS WITHOUT CONTRAST TECHNIQUE: Multidetector CT imaging of the abdomen and pelvis was performed following the standard protocol without IV contrast. COMPARISON:  03/22/2011 FINDINGS: Lower chest: No acute pleural or parenchymal lung disease.  Hepatobiliary: Nodular contour of the liver with hypertrophy of the left lobe compatible with chronic cirrhosis. Gallbladder is surgically absent. No biliary dilation. Pancreas: Unremarkable. No pancreatic ductal dilatation or surrounding inflammatory changes. Spleen: Borderline enlarged pancreas measuring 12.3 cm in craniocaudal extent. Adrenals/Urinary Tract: The adrenals are unremarkable. Punctate 3 mm nonobstructing calculus left kidney reference image 30 unchanged. Right kidney is unremarkable. Ureters and bladder are grossly normal. Stomach/Bowel: No bowel obstruction or ileus. Normal appendix right lower quadrant. There is scattered sigmoid diverticulosis without diverticulitis. Vascular/Lymphatic: Mild atherosclerosis of the aorta unchanged. Stent within the left common iliac vein identified. No pathologic adenopathy. Reproductive: Status post hysterectomy. No adnexal masses. Other: No abdominal wall hernia or abnormality. No abdominopelvic ascites. Musculoskeletal: No acute displaced fractures. Postsurgical changes are identified from L4/L5 posterior fusion and discectomy. Reconstructed images are otherwise unremarkable. IMPRESSION: 1. Stable punctate nonobstructing left renal calculus. 2. Stable cirrhosis, with borderline splenomegaly likely sequela of portal venous hypertension. 3. Minimal sigmoid diverticulosis without diverticulitis. Electronically Signed   By: Randa Ngo M.D.   On: 10/22/2019 14:18    Procedures Procedures (including critical care time)  Medications Ordered in ED Medications - No data to display  ED Course  I have reviewed the triage vital signs and the nursing notes.  Pertinent labs & imaging results that were available during my care of the patient were reviewed by me and considered in my medical decision making (see chart for details).    MDM Rules/Calculators/A&P                      Update: Patient in similar condition, states that she is in substantial pain  with any motion, but completely still she has minimal symptoms. Reviewed initial labs, CT results with her. CT suggest cirrhosis, and with lab abnormalities including elevated LFTs, hyperbilirubinemia, which are new, I discussed her case with our GI colleagues.  No indication for admission for ERCP/MRCP, or additional emergent GI eval.  4:43 PM On repeat exam patient is in similar condition.  Intermittently making unremarkable.  We discussed importance of following up with GI given today's CT suggesting some cirrhosis, mild LFT abnormalities.  Patient amenable to close outpatient follow-up, states that she can do so.  We discussed pain relief, and the patient notes that she is previously on Tylenol, has multiple other allergies as comfortable using this as her primary analgesic. Absent other complaints, no hemodynamic instability, the patient is appropriate for discharge with close outpatient follow-up.  Given the reproducibility of the pain, particular with arm motion, bending, there are some suspicion for musculoskeletal etiology, though as above, she does have some mild abnormalities. Final Clinical Impression(s) / ED Diagnoses Final diagnoses:  Right upper quadrant abdominal pain     Carmin Muskrat, MD 10/22/19 1645

## 2019-10-22 NOTE — Discharge Instructions (Addendum)
As discussed, your evaluation today has been largely reassuring.  Your CT scan suggested mild cirrhosis, which is not entirely uncommon.  Your labs demonstrated mild changes in your liver function, which are consistent with possible mild cirrhosis.  It is important that you monitor your condition carefully, and do not hesitate to return to the ED if you develop new, or concerning changes in your condition.  Otherwise, please follow-up with your physicians for appropriate ongoing care.  Please be sure to discuss your ongoing pain in your right upper abdomen, plan for follow-up with the gastroenterology team as well.

## 2019-10-22 NOTE — ED Notes (Signed)
Patient in CT

## 2019-10-22 NOTE — ED Triage Notes (Addendum)
Via EMS from [place] office C/o abdominal pain, RUQ Pain started last Friday but got worse on Saturday When EMS came, patient was in bathroom - able to use bathroom on own  Patient has been to an ED for this on Monday - Xray taken and she was told it was her lungs and she was sent home  Patient says pain has only gotten worse since then  Dull ache while laying still, sharp pain when moving Hx: gallbladder removal  Patient on prednisone pill for hip pain   166/88  82 HR 20 R 96% RA 97.0 temp  AOx4

## 2019-10-27 DIAGNOSIS — K7460 Unspecified cirrhosis of liver: Secondary | ICD-10-CM | POA: Diagnosis not present

## 2019-10-27 DIAGNOSIS — Z86010 Personal history of colonic polyps: Secondary | ICD-10-CM | POA: Diagnosis not present

## 2019-10-27 DIAGNOSIS — R748 Abnormal levels of other serum enzymes: Secondary | ICD-10-CM | POA: Diagnosis not present

## 2019-10-30 DIAGNOSIS — K760 Fatty (change of) liver, not elsewhere classified: Secondary | ICD-10-CM | POA: Diagnosis not present

## 2019-10-30 DIAGNOSIS — K7460 Unspecified cirrhosis of liver: Secondary | ICD-10-CM | POA: Diagnosis not present

## 2019-10-30 DIAGNOSIS — R1084 Generalized abdominal pain: Secondary | ICD-10-CM | POA: Diagnosis not present

## 2019-11-13 ENCOUNTER — Ambulatory Visit: Payer: HMO | Attending: Internal Medicine

## 2019-11-13 DIAGNOSIS — Z23 Encounter for immunization: Secondary | ICD-10-CM | POA: Insufficient documentation

## 2019-11-13 NOTE — Progress Notes (Signed)
   Covid-19 Vaccination Clinic  Name:  Jill Preston    MRN: [id number] DOB: 1933-07-29  11/13/2019  Jill Preston was observed post Covid-19 immunization for 15 minutes without incidence. She was provided with Vaccine Information Sheet and instruction to access the V-Safe system.   Jill Preston was instructed to call 911 with any severe reactions post vaccine: Marland Kitchen Difficulty breathing  . Swelling of your face and throat  . A fast heartbeat  . A bad rash all over your body  . Dizziness and weakness    Immunizations Administered    Name Date Dose VIS Date Route   Pfizer COVID-19 Vaccine 11/13/2019 11:03 AM 0.3 mL 08/28/2019 Intramuscular   Manufacturer: [place]   Lot: [id number]   [place]: [id number]

## 2019-11-16 DIAGNOSIS — I10 Essential (primary) hypertension: Secondary | ICD-10-CM | POA: Diagnosis not present

## 2019-11-16 DIAGNOSIS — Z0000 Encounter for general adult medical examination without abnormal findings: Secondary | ICD-10-CM | POA: Diagnosis not present

## 2019-11-16 DIAGNOSIS — L309 Dermatitis, unspecified: Secondary | ICD-10-CM | POA: Diagnosis not present

## 2019-11-16 DIAGNOSIS — E782 Mixed hyperlipidemia: Secondary | ICD-10-CM | POA: Diagnosis not present

## 2019-11-16 DIAGNOSIS — E039 Hypothyroidism, unspecified: Secondary | ICD-10-CM | POA: Diagnosis not present

## 2019-11-16 DIAGNOSIS — N183 Chronic kidney disease, stage 3 unspecified: Secondary | ICD-10-CM | POA: Diagnosis not present

## 2019-11-18 ENCOUNTER — Ambulatory Visit (INDEPENDENT_AMBULATORY_CARE_PROVIDER_SITE_OTHER): Payer: HMO | Admitting: Cardiovascular Disease

## 2019-11-18 ENCOUNTER — Other Ambulatory Visit: Payer: Self-pay

## 2019-11-18 ENCOUNTER — Encounter: Payer: Self-pay | Admitting: Cardiovascular Disease

## 2019-11-18 VITALS — BP 130/72 | HR 78 | Ht 65.5 in | Wt 208.0 lb

## 2019-11-18 DIAGNOSIS — I10 Essential (primary) hypertension: Secondary | ICD-10-CM

## 2019-11-18 DIAGNOSIS — Z6835 Body mass index (BMI) 35.0-35.9, adult: Secondary | ICD-10-CM | POA: Diagnosis not present

## 2019-11-18 DIAGNOSIS — I5032 Chronic diastolic (congestive) heart failure: Secondary | ICD-10-CM

## 2019-11-18 DIAGNOSIS — I739 Peripheral vascular disease, unspecified: Secondary | ICD-10-CM | POA: Diagnosis not present

## 2019-11-18 DIAGNOSIS — I25708 Atherosclerosis of coronary artery bypass graft(s), unspecified, with other forms of angina pectoris: Secondary | ICD-10-CM | POA: Diagnosis not present

## 2019-11-18 DIAGNOSIS — E782 Mixed hyperlipidemia: Secondary | ICD-10-CM | POA: Diagnosis not present

## 2019-11-18 MED ORDER — ISOSORBIDE MONONITRATE ER 30 MG PO TB24: 60.0000 mg | ORAL_TABLET | Freq: Every day | ORAL | Status: DC

## 2019-11-18 MED ORDER — FUROSEMIDE 80 MG PO TABS: 80.0000 mg | ORAL_TABLET | Freq: Every day | ORAL | Status: DC

## 2019-11-18 NOTE — Progress Notes (Signed)
Patient ID: JAMARIANA SFORZA, female   DOB: [birth year], 86 y.o.   MRN: [id number] Patient ID: ELISA STARNS, female   DOB: August 18, 1933, 86 y.o.   MRN: [id number]    Cardiology Office Note    Date:  11/18/2019   ID:  DAVALYN MANSKE, DOB 04-20-1933, MRN [id number]  PCP:  Merrilee Seashore, MD  Cardiologist:   Sanda Klein, MD   chief complaint: Fatigue   History of Present Illness:  Jill Preston is a 86 y.o. female returning in follow-up for CAD and CHF.  She had a difficult autumn and winter after suffering another peroneal tendon fracture and requiring surgery in September.  She was nonweightbearing for a long time.  She is now walking with a cane and is feeling better.  She has been following a strict reduced calorie diet and has successfully lost some weight.  She is now in the mildly obese range with a BMI of about 34.  She has not had problems with edema (except for swelling in the operated right ankle).  She did have a duplex ultrasound scan for DVT in January which was negative.  She is no longer taking any anticoagulants but is taking low-dose aspirin.  She has only required furosemide once daily and is taking isosorbide once daily, without experiencing angina pectoris.  She denies orthopnea, PND or exertional dyspnea.  She has not had dizziness or syncope.  She complains about dry skin.  She has not had any falls or bleeding problems.  She denies palpitations.  She had a visit to the emergency room with a right-sided chest discomfort diagnosed as costochondritis and improved by using a heating pad.  She had labs with Dr. Ashby Dawes just 2 days ago.  We had estimated her "dry weight" at about 207 pounds on our office scale.  She weighs 200 pounds today.  Her home scale usually estimates her weight about 4 pounds higher.  She is only taking furosemide once daily.  She is also taking isosorbide once daily.  She is not sure if she is taking amlodipine.  Normal lower extremity  venous Dopplers in January 2021.  She has a long history of coronary disease. She underwent proximal LAD bare-metal stenting and balloon angioplasty of the mid LAD in 2013. She has had persistent ischemia in the territory of the diagonal artery on nuclear stress test performed over the last few years. Coronary angiography performed May 2016 showed a widely patent LAD stent, 60% ostial circumflex stenosis, 20-30% right coronary artery stenosis. There was concern about possible left coronary artery stenosis but fractional flow reserve was normal (0.96 at baseline, 0.91 during intravenous adenosine infusion). She had good anginal response to Ranexa but developed severe constipation and could not tolerate the medication. She has been intolerant to beta blockers due to bradycardia. She has preserved left ventricular systolic function but has had episodes of acute exacerbation of diastolic heart failure attributed to hypertensive heart disease. December 2015, she was critically ill with an acute left iliofemoral DVT with anticoagulation complicated by retroperitoneal hematoma and hemorrhagic shock, requiring placement of an inferior vena cava filter. The filter was removed in June 2016. Coronary angiography in July 2018 showed unchanged anatomy of the eccentric stenosis in the left main coronary artery with noncritical FFR (0.89 in the LAD, 0.86 in the LCx).  Intravascular Pressure Wire/FFR Study  Left Heart Cath and Coronary Angiography  Conclusion     LM lesion, 70 %stenosed.  Mid LAD lesion, 0 %stenosed at  site of prior stent.  Ost Cx lesion, 60 %stenosed.  Prox RCA-1 lesion, 20 %stenosed.  Prox RCA-2 lesion, 30 %stenosed.  The left ventricular systolic function is normal.  LV end diastolic pressure is normal.  The left ventricular ejection fraction is 55-65% by visual estimate.   1. 70% eccentric distal left main stenosis. Angiographically similar to 2016. FFR 0.89 into the LAD and 0.86 into  the LCx suggesting that this lesion is not flow limiting.  2. Otherwise nonobstructive CAD. Patent stent in LAD. 3. Normal LV function 4. Normal LVEDP  Plan: recommend continued medical therapy.     Past Medical History:  Diagnosis Date  . Arthritis    right knee  . Cancer [facility]) yrs ago   skin cancer removed from face  . Chronic diastolic CHF (congestive heart failure) ([place])   . Chronic venous insufficiency    LEA VENOUS, 10/17/2011 - mild reflux in bilateral common femoral veins  . CKD (chronic kidney disease), stage III   . Coronary artery disease    a. s/p PCI/BMS to prox LAD and balloon angioplasty to mLAD with suboptimal result in 2000. b. Abnl nuc 2012, cath 09/2011 - showed that the overall territory of potential ischemia was small and attributable to a moderately diseased small second diagonal artery. Med rx.  Marland Kitchen Dyspnea    with activity  . Headache   . History of blood transfusion 2017  . Hyperlipidemia   . Hypertension   . Hypertensive heart disease   . Hypothyroidism   . Obesity   . Pneumonia    several times  . Stroke [facility])    pt. states she had "light stroke" in sept. 1980  . Urinary incontinence     Past Surgical History:  Procedure Laterality Date  . ABDOMINAL HYSTERECTOMY  1970's   complete  . ANKLE ARTHROSCOPY Right 07/10/2017   Procedure: ANKLE ARTHROSCOPY;  Surgeon: [name], DPM;  Location: [place];  Service: Podiatry;  Laterality: Right;  . BACK SURGERY  07/26/2016   cervical neck surgery, [facility]  . BLADDER SURGERY  2010   WITH MESH  bladder tach  . bunion removal surgery Bilateral 15 yrs ago  . CARDIAC CATHETERIZATION Left 09/25/2011   Medical management  . CARDIAC CATHETERIZATION Left 03/25/2001   Normal LV function, LAD residual narrowing of less than 10%, normal ramus intermediate, circumflex, and RCA,   . CARDIAC CATHETERIZATION  09/04/1999   LAD, 3x22mm Tetra stent resulting in a reduction of the 80% stenosis to 0%  residual  . CARDIAC CATHETERIZATION N/A 01/26/2015   Procedure: Right/Left Heart Cath and Coronary Angiography;  Surgeon: Troy Sine, MD;  Location: [place] [facility];  Service: Cardiovascular;  Laterality: N/A;  . CARDIAC CATHETERIZATION N/A 01/27/2015   Procedure: Intravascular Pressure Wire/FFR Study;  Surgeon: Burnell Blanks, MD;  Location: [place] CV LAB;  Service: Cardiovascular;  Laterality: N/A;  . CARDIAC CATHETERIZATION N/A 01/27/2015   Procedure: Right Heart Cath;  Surgeon: Burnell Blanks, MD;  Location: [place] CV LAB;  Service: Cardiovascular;  Laterality: N/A;  . CHOLECYSTECTOMY    . COLONOSCOPY WITH PROPOFOL N/A 03/07/2017   Procedure: COLONOSCOPY WITH PROPOFOL;  Surgeon: Juanita Craver, MD;  Location: [facility];  Service: Endoscopy;  Laterality: N/A;  . CORONARY STENT PLACEMENT     LAD x 1  . ganglion cyst removal  yrs ago   x 2  . ILIAC VEIN ANGIOPLASTY / STENTING  02/15/2015  . INTRAVASCULAR PRESSURE WIRE/FFR  STUDY N/A 04/05/2017   Procedure: Intravascular Pressure Wire/FFR Study;  Surgeon: Martinique, Peter M, MD;  Location: [place] CV LAB;  Service: Cardiovascular;  Laterality: N/A;  . IVC FILTER INSERTION  2016  . IVC FILTER REMOVAL  02/15/2015   at [facility]  . LEFT HEART CATH AND CORONARY ANGIOGRAPHY N/A 04/05/2017   Procedure: Left Heart Cath and Coronary Angiography;  Surgeon: Martinique, Peter M, MD;  Location: [place] CV [facility];  Service: Cardiovascular;  Laterality: N/A;  . LEFT HEART CATHETERIZATION WITH CORONARY ANGIOGRAM N/A 09/25/2011   Procedure: LEFT HEART CATHETERIZATION WITH CORONARY ANGIOGRAM;  Surgeon: Sanda Klein, MD;  Location: [place] CATH LAB;  Service: Cardiovascular;  Laterality: N/A;  . multiple bladder surgeries to remove mesh    . ROTATOR CUFF REPAIR Right   . stent to groin Left 08/2014   left leg  . TENDON REPAIR Right 07/10/2017   Procedure: RIGHT PERONEAL TENDON REPAIR;  Surgeon: [name], DPM;  Location: [place];   Service: Podiatry;  Laterality: Right;  . TENDON REPAIR Right 05/27/2019   Procedure: PERONEAL TENDON REPAIR x2;  Surgeon: [name], DPM;  Location: [facility];  Service: Podiatry;  Laterality: Right;    Outpatient Medications Prior to Visit  Medication Sig Dispense Refill  . acetaminophen (TYLENOL) 500 MG tablet Take 1,000 mg by mouth at bedtime.    . Ascorbic Acid (VITAMIN C) 1000 MG tablet Take 1,000 mg by mouth daily.     Marland Kitchen aspirin EC 81 MG tablet Take 1 tablet (81 mg total) by mouth daily. 90 tablet 3  . amLODipine (NORVASC) 2.5 MG tablet Take 1 tablet (2.5 mg total) by mouth daily. 90 tablet 1  . bisacodyl (DULCOLAX) 5 MG EC tablet Take 10 mg by mouth daily.     . cholecalciferol (VITAMIN D3) 25 MCG (1000 UNIT) tablet Take 1,000 Units by mouth daily.     . clobetasol ointment (TEMOVATE) [id number] % Apply 1 application topically daily as needed (rash).     . ezetimibe (ZETIA) 10 MG tablet Take 1 tablet (10 mg total) by mouth daily. 90 tablet 3  . KLOR-CON M20 20 MEQ tablet Take 2 tablets (40 mEq total) by mouth daily. (Patient taking differently: Take 20 mEq by mouth daily. ) 180 tablet 3  . levothyroxine (SYNTHROID, LEVOTHROID) 112 MCG tablet Take 112 mcg by mouth daily before breakfast.   4  . losartan-hydrochlorothiazide (HYZAAR) 100-12.5 MG tablet TAKE 1 TABLET BY MOUTH EVERY DAY (Patient not taking: Reported on 10/17/2019) 90 tablet 2  . metolazone (ZAROXOLYN) 2.5 MG tablet Take 1 tablet (2.5 mg total) by mouth as needed. Take 2.5 mg 30 min before AM dose of Lasix (only take when instructed to do so by Dr. Sallyanne Kuster) (Patient taking differently: Take 2.5 mg by mouth daily as needed (when instructed to do so by Dr. Sallyanne Kuster). ) 30 tablet 6  . Multiple Vitamins-Minerals (HAIR/SKIN/NAILS/BIOTIN PO) Take 2 tablets by mouth daily.    . nitroGLYCERIN (NITROSTAT) 0.4 MG SL tablet Place 1 tablet (0.4 mg total) under the tongue every 5 (five) minutes as needed for chest pain. 25 tablet 2  . nystatin  (MYCOSTATIN/NYSTOP) powder Apply 1 application topically daily as needed (rash).     Marland Kitchen OVER THE COUNTER MEDICATION Take 1 Scoop by mouth daily. "Ruby Red" - mix in fluid and drink    . OVER THE COUNTER MEDICATION Take 1 tablet by mouth daily. "Focus factor"    . pravastatin (PRAVACHOL) 80 MG tablet Take  80 mg by mouth daily.     . furosemide (LASIX) 80 MG tablet Take 80 mg at 6 am and 80 mg at 1 pm (Patient taking differently: Take 80 mg by mouth daily. ) 180 tablet 3  . isosorbide mononitrate (IMDUR) 30 MG 24 hr tablet TAKE 2 TABLETS BY MOUTH TWICE DAILY. (Patient taking differently: Take 60 mg by mouth daily. ) 360 tablet 1  . oxyCODONE-acetaminophen (PERCOCET) 10-325 MG tablet Take 1 tablet by mouth every 4 (four) hours as needed for pain. (Patient not taking: Reported on 10/17/2019) 20 tablet 0  . tiZANidine (ZANAFLEX) 4 MG tablet Take 4 mg by mouth 3 (three) times daily as needed for muscle spasms.      No facility-administered medications prior to visit.     Allergies:   Atorvastatin, Penicillins, Shellfish allergy, Aminophylline, Iodinated diagnostic agents, Latex, Oxybutynin chloride, Vancomycin, Adhesive [tape], Betadine [povidone iodine], Ciprofloxacin, Codeine, and Ranolazine   Social History   Socioeconomic History  . Marital status: Married    Spouse name: Not on file  . Number of children: Not on file  . Years of education: Not on file  . Highest education level: Not on file  Occupational History  . Not on file  Tobacco Use  . Smoking status: Never Smoker  . Smokeless tobacco: Never Used  Substance and Sexual Activity  . Alcohol use: No  . Drug use: No  . Sexual activity: Not on file  Other Topics Concern  . Not on file  Social History Narrative  . Not on file   Social Determinants of Health   Financial Resource Strain:   . Difficulty of Paying Living Expenses: Not on file  Food Insecurity: No Food Insecurity  . Worried About Charity fundraiser in the Last Year:  Never true  . Ran Out of Food in the Last Year: Never true  Transportation Needs: No Transportation Needs  . Lack of Transportation (Medical): No  . Lack of Transportation (Non-Medical): No  Physical Activity:   . Days of Exercise per Week: Not on file  . Minutes of Exercise per Session: Not on file  Stress:   . Feeling of Stress : Not on file  Social Connections:   . Frequency of Communication with Friends and Family: Not on file  . Frequency of Social Gatherings with Friends and Family: Not on file  . Attends Religious Services: Not on file  . Active Member of Clubs or Organizations: Not on file  . Attends Archivist Meetings: Not on file  . Marital Status: Not on file     Family History:  The patient's family history includes Cancer in her mother; Heart disease in her brother, father, and sister.   ROS:   Please see the history of present illness.    ROS All other systems are reviewed and are negative  PHYSICAL EXAM:   VS:  BP 130/72   Pulse 78   Ht 5' 5.5" (1.664 m)   Wt 208 lb (94.3 kg)   BMI 34.09 kg/m      General: Alert, oriented x3, no distress, mildly obese Head: no evidence of trauma, PERRL, EOMI, no exophtalmos or lid lag, no myxedema, no xanthelasma; normal ears, nose and oropharynx Neck: normal jugular venous pulsations and no hepatojugular reflux; brisk carotid pulses without delay and no carotid bruits Chest: clear to auscultation, no signs of consolidation by percussion or palpation, normal fremitus, symmetrical and full respiratory excursions Cardiovascular: normal position and quality of  the apical impulse, regular rhythm, normal first and second heart sounds, no murmurs, rubs or gallops Abdomen: no tenderness or distention, no masses by palpation, no abnormal pulsatility or arterial bruits, normal bowel sounds, no hepatosplenomegaly Extremities: no clubbing, cyanosis or edema, right ankle is a little more swollen; 2+ radial, ulnar and brachial  pulses bilaterally; 2+ right femoral, posterior tibial and dorsalis pedis pulses; 2+ left femoral, posterior tibial and dorsalis pedis pulses; no subclavian or femoral bruits Neurological: grossly nonfocal Psych: Normal mood and affect   Wt Readings from Last 3 Encounters:  11/18/19 208 lb (94.3 kg)  10/22/19 212 lb (96.2 kg)  10/17/19 214 lb (97.1 kg)      Studies/Labs Reviewed:   EKG:  EKG is not ordered today.  Tracing from October 22, 2019 shows normal sinus rhythm and very mild nonspecific T wave changes, QTC 450 ms.  Recent Labs: 12/02/2018: Magnesium 1.6 12/22/2018: BNP 60.4 10/22/2019: ALT 38; BUN 19; Creatinine, Ser 0.93; Hemoglobin 15.7; Platelets 160; Potassium 4.5; Sodium 143   Lipid Panel    Component Value Date/Time   CHOL 193 11/18/2017 0857   TRIG 212 (H) 11/18/2017 0857   HDL 47 11/18/2017 0857   CHOLHDL 4.1 11/18/2017 0857   CHOLHDL 4.2 06/27/2015 1336   VLDL 48 (H) 06/27/2015 1336   LDLCALC 104 (H) 11/18/2017 0857   Labs November 04, 2018 Total cholesterol 159, HDL 59, LDL 77, calcium 750 Normal liver function tests, creatinine 0.99, potassium 3.7  11/16/2019 Hemoglobin 14.9, creatinine 0.85, glucose 89, liver function tests are all normal except for alkaline phosphatase 132 Total cholesterol 136, triglycerides 97, HDL 62, LDL 56 TSH 6.99   CATH 04/05/2017   LM lesion, 70 %stenosed.  Mid LAD lesion, 0 %stenosed at site of prior stent.  Ost Cx lesion, 60 %stenosed.  Prox RCA-1 lesion, 20 %stenosed.  Prox RCA-2 lesion, 30 %stenosed.  The left ventricular systolic function is normal.  LV end diastolic pressure is normal.  The left ventricular ejection fraction is 55-65% by visual estimate.   1. 70% eccentric distal left main stenosis. Angiographically similar to 2016. FFR 0.89 into the LAD and 0.86 into the LCx suggesting that this lesion is not flow limiting.  2. Otherwise nonobstructive CAD. Patent stent in LAD. 3. Normal LV function 4.  Normal LVEDP  Plan: recommend continued medical therapy.   ASSESSMENT:    1. Chronic diastolic CHF (congestive heart failure) ([place])   2. Coronary artery disease of bypass graft of native heart with stable angina pectoris ([place])   3. Essential hypertension   4. PAD (peripheral artery disease) ([place])   5. Mixed hyperlipidemia   6. Class 2 severe obesity due to excess calories with serious comorbidity and body mass index (BMI) of 35.0 to 35.9 in adult [facility])      PLAN:  In order of problems listed above:  1. CHF: Appears to be euvolemic, and functional status (limited more mechanical than breathing problems).  She is taking both thiazide and loop diuretics, but only once daily..  2. CAD: Symptoms are currently well controlled on antianginal medications.  She is taking long-acting nitrates just once a day, and is not sure that she is taking amlodipine anymore.  She will check when she goes home and call us.  She did not tolerate beta-blockers due to bradycardia in the ranolazine due to constipation.  Note that at 1 point she had shortness of breath that responded to treatment with amlodipine, suggesting it may have been an anginal  equivalent and may be related to vasospasm.  She is known to have a moderate stenosis in the left main coronary artery that was not significant by pressure wire analysis at cardiac catheterization performed a in July 2018.   3. HTN: Adequate blood pressure control.  Should always be checked in the right arm. 4. PAD: Asymptomatic.  Blood pressure is lower in the left arm, but she does not have claudication or subclavian steal syndrome symptoms. 5. Hyperlipidemia: LDL cholesterol is now well within target range, under 70.  She did not tolerate more potent statins and is on pravastatin maximum dose plus ezetimibe.  If in 6 months her LDL cholesterol is still in similar range I would recommend stopping the ezetimibe. 6. Obesity: It is possible that she's lost some real  weight.  Congratulated on her efforts.  Remains in mildly obese range.     Medication Adjustments/Labs and Tests Ordered: Current medicines are reviewed at length with the patient today.  Concerns regarding medicines are outlined above.  Medication changes, Labs and Tests ordered today are listed in the Patient Instructions below. Patient Instructions  Medication Instructions:  No changes  Please call us when you get home to let us know if you are taking the Amlodipine.  *If you need a refill on your cardiac medications before your next appointment, please call your pharmacy*   Lab Work: None ordered If you have labs (blood work) drawn today and your tests are completely normal, you will receive your results only by: Marland Kitchen MyChart Message (if you have MyChart) OR . A paper copy in the mail If you have any lab test that is abnormal or we need to change your treatment, we will call you to review the results.   Testing/Procedures: None ordered   Follow-Up: At [facility], you and your health needs are our priority.  As part of our continuing mission to provide you with exceptional heart care, we have created designated Provider Care Teams.  These Care Teams include your primary Cardiologist (physician) and Advanced Practice Providers (APPs -  Physician Assistants and Nurse Practitioners) who all work together to provide you with the care you need, when you need it.  We recommend signing up for the patient portal called "MyChart".  Sign up information is provided on this After Visit Summary.  MyChart is used to connect with patients for Virtual Visits (Telemedicine).  Patients are able to view lab/test results, encounter notes, upcoming appointments, etc.  Non-urgent messages can be sent to your provider as well.   To learn more about what you can do with MyChart, go to [email or web address].    Your next appointment:   6 month(s)  The format for your next appointment:   In  Person  Provider:   You may see Sanda Klein, MD or one of the following Advanced Practice Providers on your designated Care Team:    Almyra Deforest, PA-C  Fabian Sharp, [place] or   Roby Lofts, PA-C        Signed, Sanda Klein, MD  11/18/2019 [address], [place], [place]  [address] Phone: [phone]; Fax: [phone]

## 2019-11-18 NOTE — Patient Instructions (Signed)
Medication Instructions:  No changes  Please call us when you get home to let us know if you are taking the Amlodipine.  *If you need a refill on your cardiac medications before your next appointment, please call your pharmacy*   Lab Work: None ordered If you have labs (blood work) drawn today and your tests are completely normal, you will receive your results only by: Marland Kitchen MyChart Message (if you have MyChart) OR . A paper copy in the mail If you have any lab test that is abnormal or we need to change your treatment, we will call you to review the results.   Testing/Procedures: None ordered   Follow-Up: At [facility], you and your health needs are our priority.  As part of our continuing mission to provide you with exceptional heart care, we have created designated Provider Care Teams.  These Care Teams include your primary Cardiologist (physician) and Advanced Practice Providers (APPs -  Physician Assistants and Nurse Practitioners) who all work together to provide you with the care you need, when you need it.  We recommend signing up for the patient portal called "MyChart".  Sign up information is provided on this After Visit Summary.  MyChart is used to connect with patients for Virtual Visits (Telemedicine).  Patients are able to view lab/test results, encounter notes, upcoming appointments, etc.  Non-urgent messages can be sent to your provider as well.   To learn more about what you can do with MyChart, go to [email or web address].    Your next appointment:   6 month(s)  The format for your next appointment:   In Person  Provider:   You may see Sanda Klein, MD or one of the following Advanced Practice Providers on your designated Care Team:    Almyra Deforest, PA-C  Fabian Sharp, PA-C or   Roby Lofts, [place]

## 2019-11-19 ENCOUNTER — Telehealth: Payer: Self-pay | Admitting: *Deleted

## 2019-11-19 NOTE — Telephone Encounter (Signed)
I spoke pt and informed of Dr. Eleanora Neighbor 11/19/2019 10:19am review of results.

## 2019-11-19 NOTE — Telephone Encounter (Signed)
Dr. March Rummage states, Can you call her and let her know the [place] did not show any clots?

## 2019-11-25 DIAGNOSIS — E039 Hypothyroidism, unspecified: Secondary | ICD-10-CM | POA: Diagnosis not present

## 2019-11-25 DIAGNOSIS — E782 Mixed hyperlipidemia: Secondary | ICD-10-CM | POA: Diagnosis not present

## 2019-11-25 DIAGNOSIS — I739 Peripheral vascular disease, unspecified: Secondary | ICD-10-CM | POA: Diagnosis not present

## 2019-11-25 DIAGNOSIS — I5032 Chronic diastolic (congestive) heart failure: Secondary | ICD-10-CM | POA: Diagnosis not present

## 2019-11-25 DIAGNOSIS — Z0000 Encounter for general adult medical examination without abnormal findings: Secondary | ICD-10-CM | POA: Diagnosis not present

## 2019-11-25 DIAGNOSIS — I10 Essential (primary) hypertension: Secondary | ICD-10-CM | POA: Diagnosis not present

## 2019-11-25 DIAGNOSIS — K7460 Unspecified cirrhosis of liver: Secondary | ICD-10-CM | POA: Diagnosis not present

## 2019-11-25 DIAGNOSIS — I130 Hypertensive heart and chronic kidney disease with heart failure and stage 1 through stage 4 chronic kidney disease, or unspecified chronic kidney disease: Secondary | ICD-10-CM | POA: Diagnosis not present

## 2019-11-25 DIAGNOSIS — I2510 Atherosclerotic heart disease of native coronary artery without angina pectoris: Secondary | ICD-10-CM | POA: Diagnosis not present

## 2019-11-27 ENCOUNTER — Telehealth: Payer: Self-pay | Admitting: *Deleted

## 2019-11-27 NOTE — Telephone Encounter (Signed)
Called the patient. She stated that she still had losartan hydrochlorothiazide on her medication list but the pharmacy said she has not filled it in over year.   It was reported on 10/22/19 that the patient was not taking it.  She has been advised that this will be taken off of her list but a note will be sent to Dr. Sallyanne Kuster. If anything changes, we will call her back.    Message from secure chat:  Lattie Haw this patient called me she has a medication on her med list that she has question about Hydrochlrothiazide . she said she called th drugstore and they said she hasn't had in a year, so she is wondering if Dr. Sallyanne Kuster wants her to take this or not. call back number is [phone]. thank you for your help.

## 2019-12-01 DIAGNOSIS — M2140 Flat foot [pes planus] (acquired), unspecified foot: Secondary | ICD-10-CM | POA: Diagnosis not present

## 2019-12-01 DIAGNOSIS — M2550 Pain in unspecified joint: Secondary | ICD-10-CM | POA: Diagnosis not present

## 2019-12-01 DIAGNOSIS — R768 Other specified abnormal immunological findings in serum: Secondary | ICD-10-CM | POA: Diagnosis not present

## 2019-12-01 DIAGNOSIS — K7460 Unspecified cirrhosis of liver: Secondary | ICD-10-CM | POA: Diagnosis not present

## 2019-12-01 DIAGNOSIS — K760 Fatty (change of) liver, not elsewhere classified: Secondary | ICD-10-CM | POA: Diagnosis not present

## 2019-12-01 DIAGNOSIS — M1990 Unspecified osteoarthritis, unspecified site: Secondary | ICD-10-CM | POA: Diagnosis not present

## 2019-12-02 DIAGNOSIS — D8989 Other specified disorders involving the immune mechanism, not elsewhere classified: Secondary | ICD-10-CM | POA: Diagnosis not present

## 2019-12-02 DIAGNOSIS — R768 Other specified abnormal immunological findings in serum: Secondary | ICD-10-CM | POA: Diagnosis not present

## 2019-12-02 DIAGNOSIS — K760 Fatty (change of) liver, not elsewhere classified: Secondary | ICD-10-CM | POA: Diagnosis not present

## 2019-12-02 DIAGNOSIS — N390 Urinary tract infection, site not specified: Secondary | ICD-10-CM | POA: Diagnosis not present

## 2019-12-03 DIAGNOSIS — E669 Obesity, unspecified: Secondary | ICD-10-CM | POA: Diagnosis not present

## 2019-12-08 ENCOUNTER — Ambulatory Visit: Payer: HMO | Attending: Internal Medicine

## 2019-12-08 DIAGNOSIS — Z23 Encounter for immunization: Secondary | ICD-10-CM

## 2019-12-08 NOTE — Progress Notes (Signed)
   Covid-19 Vaccination Clinic  Name:  AMORINA EDSALL    MRN: [id number] DOB: [birth year]  12/08/2019  Ms. Durden was observed post Covid-19 immunization for 15 minutes without incident. She was provided with Vaccine Information Sheet and instruction to access the V-Safe system.   Ms. Marchant was instructed to call 911 with any severe reactions post vaccine: Marland Kitchen Difficulty breathing  . Swelling of face and throat  . A fast heartbeat  . A bad rash all over body  . Dizziness and weakness   Immunizations Administered    Name Date Dose VIS Date Route   Pfizer COVID-19 Vaccine 12/08/2019  1:22 PM 0.3 mL 08/28/2019 Intramuscular   Manufacturer: [place]   Lot: [id number]   [place]: [id number]

## 2020-01-21 ENCOUNTER — Other Ambulatory Visit: Payer: Self-pay

## 2020-01-21 MED ORDER — EZETIMIBE 10 MG PO TABS: 10.0000 mg | ORAL_TABLET | Freq: Every day | ORAL | 3 refills | Status: DC

## 2020-02-24 DIAGNOSIS — D485 Neoplasm of uncertain behavior of skin: Secondary | ICD-10-CM | POA: Diagnosis not present

## 2020-02-24 DIAGNOSIS — L821 Other seborrheic keratosis: Secondary | ICD-10-CM | POA: Diagnosis not present

## 2020-03-23 DIAGNOSIS — E782 Mixed hyperlipidemia: Secondary | ICD-10-CM | POA: Diagnosis not present

## 2020-03-23 DIAGNOSIS — E039 Hypothyroidism, unspecified: Secondary | ICD-10-CM | POA: Diagnosis not present

## 2020-03-23 DIAGNOSIS — I130 Hypertensive heart and chronic kidney disease with heart failure and stage 1 through stage 4 chronic kidney disease, or unspecified chronic kidney disease: Secondary | ICD-10-CM | POA: Diagnosis not present

## 2020-03-23 DIAGNOSIS — I10 Essential (primary) hypertension: Secondary | ICD-10-CM | POA: Diagnosis not present

## 2020-03-30 DIAGNOSIS — K7460 Unspecified cirrhosis of liver: Secondary | ICD-10-CM | POA: Diagnosis not present

## 2020-03-30 DIAGNOSIS — E039 Hypothyroidism, unspecified: Secondary | ICD-10-CM | POA: Diagnosis not present

## 2020-03-30 DIAGNOSIS — Z23 Encounter for immunization: Secondary | ICD-10-CM | POA: Diagnosis not present

## 2020-03-30 DIAGNOSIS — I2510 Atherosclerotic heart disease of native coronary artery without angina pectoris: Secondary | ICD-10-CM | POA: Diagnosis not present

## 2020-03-30 DIAGNOSIS — I739 Peripheral vascular disease, unspecified: Secondary | ICD-10-CM | POA: Diagnosis not present

## 2020-03-30 DIAGNOSIS — I10 Essential (primary) hypertension: Secondary | ICD-10-CM | POA: Diagnosis not present

## 2020-03-30 DIAGNOSIS — E782 Mixed hyperlipidemia: Secondary | ICD-10-CM | POA: Diagnosis not present

## 2020-03-30 DIAGNOSIS — I130 Hypertensive heart and chronic kidney disease with heart failure and stage 1 through stage 4 chronic kidney disease, or unspecified chronic kidney disease: Secondary | ICD-10-CM | POA: Diagnosis not present

## 2020-03-30 DIAGNOSIS — I5032 Chronic diastolic (congestive) heart failure: Secondary | ICD-10-CM | POA: Diagnosis not present

## 2020-04-04 ENCOUNTER — Encounter: Payer: Self-pay | Admitting: Podiatrist

## 2020-04-04 ENCOUNTER — Ambulatory Visit: Payer: HMO | Admitting: Podiatrist

## 2020-04-04 ENCOUNTER — Other Ambulatory Visit: Payer: Self-pay

## 2020-04-04 DIAGNOSIS — M659 Synovitis and tenosynovitis, unspecified: Secondary | ICD-10-CM | POA: Diagnosis not present

## 2020-04-04 DIAGNOSIS — S86311S Strain of muscle(s) and tendon(s) of peroneal muscle group at lower leg level, right leg, sequela: Secondary | ICD-10-CM

## 2020-04-04 NOTE — Progress Notes (Signed)
  Chief Complaint  Patient presents with  . Follow-up    History of R peroneal tendon tear. DOS - 05/27/2019. Recurrent pain - R lateral midfoot to thigh. Pt stated, "It was better for awhile. Pain = 9/10 most of the time. I take Tylenol in the morning and at night. It wakes me up at night. The pain moves up from my foot to my leg. Swelling increases when I walk.".     HPI: Patient is 86 y.o. female who presents today for the concerns as listed above. She states her foot was doing well until recently when she started having a burning, tearing pain up the right lateral ankle and up the lateral leg.  She states when she walks she notices a lot of swelling.  She relates the pain feels the same as it did when she ruptured her tendon.  She has tried to wear her boot but states the pain is still present.     Review of Systems No fevers, chills, nausea, muscle aches, no difficulty breathing, no calf pain, no chest pain or shortness of breath.   Physical Exam  GENERAL APPEARANCE: Alert, conversant. Appropriately groomed. No acute distress.   VASCULAR: Pedal pulses palpable DP and PT bilateral.  Capillary refill time is immediate to all digits,  Proximal to distal cooling it warm to warm.  Digital perfusion adequate.   NEUROLOGIC: sensation is intact epicritically and protectively to 5.07 monofilament at 5/5 sites bilateral.  Light touch is intact bilateral, vibratory sensation intact bilateral, achilles tendon reflex is intact bilateral.   MUSCULOSKELETAL: acceptable muscle strength, tone and stability bilateral. Pain and tenderness along the peroneal tendons on the right lateral foot and swelling is also palpated in this region.  Pain with inversion and eversion noted laterally as well.    DERMATOLOGIC: skin is warm, supple, and dry.  No open lesions noted.  No rash, no pre ulcerative lesions. Digital nails are asymptomatic.      Assessment   Possible re rupture peroneal tendon right ankle.    Plan  Discussed with the patient to continue wearing her boot.  Recommended getting an MRI to assess if the peroneal tendons are intact or if there might be a rupture.  Will get the MRI ordered and follow up with the result for further treatment recommendations.

## 2020-04-07 ENCOUNTER — Telehealth: Payer: Self-pay | Admitting: Cardiovascular Disease

## 2020-04-07 ENCOUNTER — Other Ambulatory Visit: Payer: Self-pay

## 2020-04-07 DIAGNOSIS — I5032 Chronic diastolic (congestive) heart failure: Secondary | ICD-10-CM

## 2020-04-07 DIAGNOSIS — I25708 Atherosclerosis of coronary artery bypass graft(s), unspecified, with other forms of angina pectoris: Secondary | ICD-10-CM

## 2020-04-07 DIAGNOSIS — R0602 Shortness of breath: Secondary | ICD-10-CM

## 2020-04-07 NOTE — Telephone Encounter (Signed)
Pt aware of recommendations and agrees will back tomorrow with update ./cy

## 2020-04-07 NOTE — Telephone Encounter (Signed)
Per pt has noted SOB,no energy "is worn out after fixing a sandwich",only sleeping a couple hours at a time with head of bed elevated.Per pt is up 4 # since Sunday.Pt currently taking Lasix 80 mg daily and Klor con 20 meq 2 tabs daily Per pt has not taken Metolazone 2.5 mg for quit some time.Per pt had recent labs done and labs were normal . Will forward to Dr Sallyanne Kuster for review and recommendations./cy

## 2020-04-07 NOTE — Telephone Encounter (Signed)
Pt c/o swelling: STAT is pt has developed SOB within 24 hours  1) How much weight have you gained and in what time span? states she has gained 5 lbs in a day before  2) If swelling, where is the swelling located? All over  3) Are you currently taking a fluid pill? yes  4) Are you currently SOB? No  5) Do you have a log of your daily weights (if so, list)? 206.5 lbs this morning, 210 lbs Tuesday, 202 lbs Sunday   6) Have you gained 3 pounds in a day or 5 pounds in a week? Yes, she states she has gained 5 lbs in a day before  7) Have you traveled recently? no   Patient states she gets SOB when moving. She states she can be sitting and moving her arms and she will get SOB. She states she also has no energy and does not rest well at night. She states her left arm and neck hurt as well and when she goes to sleep she wakes up with her arm numb. She states she has been SOB for a about a week. Please advise.

## 2020-04-07 NOTE — Telephone Encounter (Signed)
OK to take one tab of metolazone and also take one extra KCl 20 mEq tab.  Please call back with weight and symptoms tomorrow.

## 2020-04-07 NOTE — Patient Outreach (Signed)
[facility]) Care Management Chronic Special Needs Program  04/07/2020  Name: Jill Preston DOB: 04/29/1933  MRN: [id number]  Jill Preston is enrolled in a chronic special needs plan for Heart Failure. Reviewed and updated care plan.  Subjective: Client states that she has been more short of breath, tired and has had more swelling for the last week. States she weight has gone up 4 lbs since Sunday. States she called Dr. Sallyanne Kuster today and he ordered for her to take a metolazone pill and another potassium pill today.  States she took the metolazone pill when she got off the phone with the nurse.  States she is to call Dr.Croitorou in the morning with her weight and report how she is feeling.  States she has been taking her Lasix as ordered but she has not been going to the bathroom as much. States she checks her B/P most days and it was 148/72 this morning States she has not been sleeping good due to her breathing and her rt foot pain.  States she does sleep with her head up as she has a bed that raises at the head and foot.  States she saw the foot doctor a few days ago.  States her rt tendon might be re ruptured.  States she is wearing her compression hose on her legs. States she is to get another MRI of her ft.   States she is taking Tylenol twice a day which helps some with the pain. Denies any falls.  States she now has  [facility] provider visiting but she does not know when they are coming to visit.  States she has had both of her COVID shots.     Goals Addressed            This Visit's Progress   . COMPLETED: Client understands the importance of follow-up with providers by attending scheduled visits   On track    Keeping scheduled appointments Plan to keep scheduled appointments with providers Goal completed 04/07/20    . Client will increase activity tolerance by  reported less SOB within 6 months-continue 04/07/20   Worsening    Progress activity slowly    .  Client will not report change from baseline and no repeated symptoms of stroke with in the next 12 months    On track    Reports on stroke symptoms Reviewed stoke symptoms and when to call 911    . Client will report improved coping with foot pain by next 6 months       Reinforced to keep foot elevated and to wear boot as directed Encouraged to call foot doctor for any changes or increased pain    . Client will report no worsening of symptoms related to heart disease/failure within the next 6 months continue 04/07/20   Worsening    Reports increased symptoms and has contacted provider Reinforced to call Dr. Sallyanne Kuster for weight gain of 3 lbs in one day or 5 lbs in one week Signs and symptoms of heart failure reviewed Advised to notify doctor for symptoms Call 911 for severe symptoms Weigh daily and record weighs Follow a low salt diet    . Client will verbalize knowledge of self management of Hypertension as evidences by BP reading of 140/90 or less; or as defined by provider   On track    Reports B/P below 140/90 when checking daily Plan to check B/P regularly Take B/P medications as ordered Plan to follow a  low salt diet  Increase activity as tolerated    . Decrease inpatient Heart Failure admissions/ readmissions with in the year   On track    No heart failure admissions last year    . Decrease the use of hospital emergency department related to heart failure within the next year   On track    No ED visits related to heart failure Please call [facility] as needed [phone]     . COMPLETED: Maintain timely refills of Heart Failure medication as prescribed within the year    On track    Maintaining timely refills of medications per dispense report Reinforced importance of getting medications refilled on time Goal completed 04/07/20    . COMPLETED: Obtain annual  Lipid Profile, LDL-C   On track    Completed 03/23/20 LDL 77 The goal for LDL is less than 70 mg/dL as you are at high  risk for complications Try to avoid saturated fats, trans-fats and eat more fiber Plan to take statin as ordered  Goal completed 04/07/20    . Patient Stated to do chair exercises 2 times a week   Not on track    Encouraged to do upper body chair exercises 2 times a week as tolerated    . Visit Primary Care Provider or Cardiologist at least 2 times per year   On track    Primary care provider 12/03/19, 03/30/20 Cardiology on 11/18/19 Annual Wellness visit 11/16/19 [facility] provider 02/04/20  Your next [facility] visit is scheduled for 05/27/23/21 Reinforced to call [facility] as needed     Client is now actively engaged with [facility] for complex management. Reviewed [facility]'s plan of care for client.  [facility] visit on 02/04/20 and next visit scheduled for 06/10/20 Communicated with [facility] of clients increased SOB and weight  Plan:  Send successful outreach letter with a copy of their individualized care plan and Send individual care plan to provider  Chronic care management coordinator will review [facility]'s plan of care and will collaborate with [facility] team as indicated   Client will be outreached by a [employer] RNCM in 6 months per tier level     Branch, Jackquline Denmark, [place] Management Coordinator [facility] [phone]

## 2020-04-08 ENCOUNTER — Telehealth: Payer: Self-pay | Admitting: *Deleted

## 2020-04-08 DIAGNOSIS — S86311S Strain of muscle(s) and tendon(s) of peroneal muscle group at lower leg level, right leg, sequela: Secondary | ICD-10-CM

## 2020-04-08 DIAGNOSIS — M659 Unspecified synovitis and tenosynovitis, unspecified site: Secondary | ICD-10-CM

## 2020-04-08 NOTE — Addendum Note (Signed)
Addended by: Valeta Harms on: 04/08/2020 05:18 PM   Modules accepted: Orders

## 2020-04-08 NOTE — Telephone Encounter (Signed)
Thanks

## 2020-04-08 NOTE — Telephone Encounter (Signed)
Called and spoke with pt, reviewed Dr.C's recommendations that she should continue taking an extra dose of her KCL daily over the weekend and we will get repeat labs on Monday. Pt SOB over the phone states that she will get SOB when she is up and walking around and currently she is making dinner. She did report that she was laying down earlier and started wheezing. She reports when she gets tired this happens. Advised pt that if it gets worse to go to the ED to be evaluated and that if she has any other questions she can also call the provider on-call for the weekend. Pt verbalized understanding and had no other questions at this time. Dr. Loletha Grayer aware.

## 2020-04-08 NOTE — Telephone Encounter (Signed)
Faxed orders, clinical and demographics to [facility] for pre-cert and scheduling.

## 2020-04-08 NOTE — Telephone Encounter (Signed)
Patient is calling to follow up with Dr. Sallyanne Kuster regarding symptoms discussed in previous note on 04/07/20. She states she followed  Medication instructions and she is feeling better today. Swelling has decreased. Patient did not report her weight today. She reports today her BP is 146/85 and her HR is 79.

## 2020-04-08 NOTE — Telephone Encounter (Signed)
The urine output will taper off as the metolazone wears off (about 24 h).  Please continue taking one extra KCl 20 meq daily (total 3 x 20) over the weekend and if still feeling weak, check BMET on Monday

## 2020-04-08 NOTE — Telephone Encounter (Signed)
-----   Message from Bronson Ing, DPM sent at 04/07/2020  9:51 AM EDT ----- Regarding: MRI right ankle- lateral peroneal tendon tear? Hi Valery!  Would you mind ordering an MRI for this patient-  suspected peroneal tendon tear right lateral ankle.  Let me know if I need to add anything to my note.  Thanks!! Dr. Johnette Abraham

## 2020-04-08 NOTE — Telephone Encounter (Signed)
Called and spoke with pt, notified that Dr.C was thankful for the update and to continue doing what she had been doing.  She reports she feels a little better today but still has no energy. She states yesterday she felt weak but today just tired. She reports she has had to change her pad multiple times today because she couldn't make it to the bathroom in time.  She reports she weighed 206lbs yesterday and 205lbs today.  Notified I would let Dr.C know that she still has no energy and if he had any other suggestions we would let her know. Pt thankful for the call and had no other questions at this time.

## 2020-04-11 ENCOUNTER — Ambulatory Visit
Admission: RE | Admit: 2020-04-11 | Discharge: 2020-04-11 | Disposition: A | Discharge status: Home / Self Care | Payer: HMO | Source: Ambulatory Visit | Attending: Cardiovascular Disease | Admitting: Cardiovascular Disease

## 2020-04-11 DIAGNOSIS — R0602 Shortness of breath: Secondary | ICD-10-CM | POA: Diagnosis not present

## 2020-04-11 DIAGNOSIS — I25708 Atherosclerosis of coronary artery bypass graft(s), unspecified, with other forms of angina pectoris: Secondary | ICD-10-CM | POA: Diagnosis not present

## 2020-04-11 DIAGNOSIS — I5032 Chronic diastolic (congestive) heart failure: Secondary | ICD-10-CM | POA: Diagnosis not present

## 2020-04-11 NOTE — Telephone Encounter (Signed)
Spoke to patient Dr.Croitoru's advice given.She will have labs and cxr this afternoon.

## 2020-04-11 NOTE — Telephone Encounter (Signed)
Patient is calling to follow up in regards to swelling previously discussed. She states lost about 10 lbs in fluid. She weighs 20-0 lbs today, 04/11/20. She states her current BP is 148/85 and HR is 68. However, she still does not feel well. She states a bad cough has developed over weekend. Please call to discuss.

## 2020-04-11 NOTE — Telephone Encounter (Signed)
Spoke to patient she stated she was told to call this morning to report condition.Stated she feels awful.She has no energy. Stated since she took Metolazone 2.5 mg she has lost 9.5 lbs in 1 week.Weight this am 200 lbs.No swelling in feet.She is still sob.She developed a dry hacky cough this past Saturday.Stated she does not feel like doing anything.Patient was tearful while she was talking.Advised I will send message to Dr.Croitoru.

## 2020-04-11 NOTE — Addendum Note (Signed)
Addended by: Kathyrn Lass on: 04/11/2020 02:10 PM   Modules accepted: Orders

## 2020-04-11 NOTE — Telephone Encounter (Signed)
Please check a CXR PA and lateral and BMET & Mg & BNP today. Please tell her I am not in the office until next week, but we may need to arrange follow up with someone else in the meantime.

## 2020-04-12 LAB — BASIC METABOLIC PANEL
BUN/Creatinine Ratio: 24 (ref 12–28)
BUN: 22 mg/dL (ref 8–27)
CO2: 30 mmol/L — ABNORMAL HIGH (ref 20–29)
Calcium: 10 mg/dL (ref 8.7–10.3)
Chloride: 96 mmol/L (ref 96–106)
Creatinine, Ser: 0.91 mg/dL (ref 0.57–1.00)
GFR calc Af Amer: 67 mL/min/{1.73_m2} (ref >59)
GFR calc non Af Amer: 58 mL/min/{1.73_m2} — ABNORMAL LOW (ref >59)
Glucose: 107 mg/dL — ABNORMAL HIGH (ref 65–99)
Potassium: 4.1 mmol/L (ref 3.5–5.2)
Sodium: 142 mmol/L (ref 134–144)

## 2020-04-12 LAB — BRAIN NATRIURETIC PEPTIDE: BNP: 129.1 pg/mL — ABNORMAL HIGH (ref 0.0–100.0)

## 2020-04-12 LAB — MAGNESIUM: Magnesium: 1.9 mg/dL (ref 1.6–2.3)

## 2020-04-15 ENCOUNTER — Telehealth: Payer: Self-pay | Admitting: Cardiovascular Disease

## 2020-04-15 NOTE — Telephone Encounter (Signed)
New Message   Patient is call to obtain lab results. Please call.

## 2020-04-15 NOTE — Telephone Encounter (Signed)
Patient made aware of results and verbalized understanding.  CXR normal. Labs mostly OK, BNP very mildly above her baseline. Kidney function and potassium are normal.  If still short of breath and BP normal, we can try one more dose of the metolazone 2.5 mg tomorrow morning.   She stated that her weight was down to normal and the shortness of breath was better as well. She will keep [place] updated.

## 2020-04-18 ENCOUNTER — Ambulatory Visit: Payer: HMO

## 2020-04-19 NOTE — Telephone Encounter (Signed)
Pt c/o swelling: STAT is pt has developed SOB within 24 hours  1) How much weight have you gained and in what time span? Gained 10 lbs in 1 day / lost 10 lbs in 1 day  2) If swelling, where is the swelling located? Mainly legs and stomach  3) Are you currently taking a fluid pill? Yes  4) Are you currently SOB? No  5) Do you have a log of your daily weights (if so, list)?  04/18/20: 210 lbs 04/19/20: 199 lbs  6) Have you gained 3 pounds in a day or 5 pounds in a week? Patient reports she lost 10 lbs since yesterday . Last night she weighed in at 210 lbs. By this morning around 7:00 AM she weighed 199 lbs. She reports weakness, but she states she is not experiencing any additional symptoms or pain.  7) Have you traveled recently? No

## 2020-04-19 NOTE — Telephone Encounter (Signed)
Spoke to patient she wanted Dr.Croitoru to know she weighed 200 lbs yesterday morning,Stated last night she weighed 210 lbs.She took a metolazone 2.5 mg before she went to bed.Stated she urinated 3 times last night.She lost 10 lbs overnight.Stated she weighs 201.1 lbs at present.She has no energy,tired.She has sob with the least exertion.She has appointment with Dr.Croitoru 9/1.She wanted to know if he could see her sooner.Message sent to Dr.Croitoru for advice.

## 2020-04-20 ENCOUNTER — Telehealth: Payer: Self-pay | Admitting: Cardiovascular Disease

## 2020-04-20 NOTE — Telephone Encounter (Signed)
Spoke to patient Dr.Croitoru's advice given.Echo scheduled 8/6 at 1:45 pm at [facility] office.Appointment scheduled with Dr.Croitoru Mon 8/9 at 8:40 am.Stated her dry cough is worse today.She weighed 200 lbs this morning.Advised I will make Dr.Croitoru aware.

## 2020-04-20 NOTE — Telephone Encounter (Signed)
She states she received a call from [place] today. Patient states she has been coughing a lot today. She states she has lost a little more fluid.

## 2020-04-20 NOTE — Telephone Encounter (Signed)
Thank you Cheryl!

## 2020-04-20 NOTE — Telephone Encounter (Signed)
Returned call to patient no answer.Left message to call back.Message sent to schedulers to schedule echo as soon as possible and follow up appointment with Dr.Croitoru.

## 2020-04-20 NOTE — Telephone Encounter (Signed)
See previous 8/4 telephone note.

## 2020-04-20 NOTE — Telephone Encounter (Signed)
Please get an echo ASAP for CAD/CHF/dyspnea and bring in o office as soon as the echo has been performed. My need to repeat her cath.

## 2020-04-20 NOTE — Addendum Note (Signed)
Addended by: Kathyrn Lass on: 04/20/2020 04:08 PM   Modules accepted: Orders

## 2020-04-22 ENCOUNTER — Ambulatory Visit (HOSPITAL_COMMUNITY): Payer: HMO | Attending: Cardiovascular Disease

## 2020-04-22 ENCOUNTER — Other Ambulatory Visit: Payer: Self-pay

## 2020-04-22 DIAGNOSIS — R0602 Shortness of breath: Secondary | ICD-10-CM | POA: Diagnosis not present

## 2020-04-22 DIAGNOSIS — I25708 Atherosclerosis of coronary artery bypass graft(s), unspecified, with other forms of angina pectoris: Secondary | ICD-10-CM | POA: Diagnosis not present

## 2020-04-22 DIAGNOSIS — I5032 Chronic diastolic (congestive) heart failure: Secondary | ICD-10-CM | POA: Diagnosis not present

## 2020-04-22 LAB — ECHOCARDIOGRAM COMPLETE
Area-P 1/2: 2.94 cm2
S' Lateral: 3.3 cm

## 2020-04-25 ENCOUNTER — Other Ambulatory Visit: Payer: Self-pay

## 2020-04-25 ENCOUNTER — Encounter: Payer: Self-pay | Admitting: Cardiovascular Disease

## 2020-04-25 ENCOUNTER — Ambulatory Visit: Payer: HMO | Admitting: Cardiovascular Disease

## 2020-04-25 VITALS — BP 155/67 | HR 65 | Ht 65.0 in | Wt 207.2 lb

## 2020-04-25 DIAGNOSIS — E7800 Pure hypercholesterolemia, unspecified: Secondary | ICD-10-CM

## 2020-04-25 DIAGNOSIS — E668 Other obesity: Secondary | ICD-10-CM

## 2020-04-25 DIAGNOSIS — I2510 Atherosclerotic heart disease of native coronary artery without angina pectoris: Secondary | ICD-10-CM

## 2020-04-25 DIAGNOSIS — I739 Peripheral vascular disease, unspecified: Secondary | ICD-10-CM | POA: Diagnosis not present

## 2020-04-25 DIAGNOSIS — I10 Essential (primary) hypertension: Secondary | ICD-10-CM

## 2020-04-25 DIAGNOSIS — I5032 Chronic diastolic (congestive) heart failure: Secondary | ICD-10-CM | POA: Diagnosis not present

## 2020-04-25 MED ORDER — METOLAZONE 2.5 MG PO TABS: 2.5000 mg | ORAL_TABLET | ORAL | 1 refills | Status: DC

## 2020-04-25 NOTE — Patient Instructions (Signed)
Medication Instructions:  METOLAZONE: Take one 2.5 mg tablet twice weekly on Tuesday and Fridays.  *If you need a refill on your cardiac medications before your next appointment, please call your pharmacy*   Lab Work: Your provider would like for you to return in 2 weeks to have the following labs drawn: BMET. You do not need an appointment for the lab. Once in our office lobby there is a podium where you can sign in and ring the doorbell to alert [place] that you are here. The lab is open from 8:00 am to 4:30 pm; closed for lunch from 12:45pm-1:45pm.  If you have labs (blood work) drawn today and your tests are completely normal, you will receive your results only by: Marland Kitchen MyChart Message (if you have MyChart) OR . A paper copy in the mail If you have any lab test that is abnormal or we need to change your treatment, we will call you to review the results.   Testing/Procedures: None ordered   Follow-Up: At [facility], you and your health needs are our priority.  As part of our continuing mission to provide you with exceptional heart care, we have created designated Provider Care Teams.  These Care Teams include your primary Cardiologist (physician) and Advanced Practice Providers (APPs -  Physician Assistants and Nurse Practitioners) who all work together to provide you with the care you need, when you need it.  We recommend signing up for the patient portal called "MyChart".  Sign up information is provided on this After Visit Summary.  MyChart is used to connect with patients for Virtual Visits (Telemedicine).  Patients are able to view lab/test results, encounter notes, upcoming appointments, etc.  Non-urgent messages can be sent to your provider as well.   To learn more about what you can do with MyChart, go to [email or web address].    Your next appointment:   Keep your appointment on 05/18/20 at 9 am with Dr. Sallyanne Kuster

## 2020-04-27 DIAGNOSIS — N644 Mastodynia: Secondary | ICD-10-CM | POA: Diagnosis not present

## 2020-04-27 NOTE — Progress Notes (Signed)
Patient ID: Jill Preston, female   DOB: [birth year], 86 y.o.   MRN: [id number] Patient ID: Jill Preston, female   DOB: 1932/12/13, 86 y.o.   MRN: [id number]    Cardiology Office Note    Date:  04/27/2020   ID:  Jill Preston, DOB December 27, 1932, MRN [id number]  PCP:  Merrilee Seashore, MD  Cardiologist:   Sanda Klein, MD   chief complaint: Fatigue, dyspnea.  History of Present Illness:  Jill Preston is a 86 y.o. female returning in follow-up for CAD and CHF.  Jill Preston is quite frustrated and feels exhausted.  She has been struggling with worse dyspnea.  She gained weight (presumably fluid) gradually.  After increasing diuretics and prescribing a couple of doses of metolazone she has lost about 10 pounds of fluid.  Her weight is is now at our previously estimated "dry weight" of 207 pounds on our office scale. She is still short of breath.  Her echocardiogram shows normal left ventricular systolic function, but still showed signs of volume overload, based on the diastolic function analysis.  She has not had dizziness or syncope.  She denies palpitations.  She has had leg edema, but this has improved.  She does not have orthopnea or PND.  Her home scale usually estimates her weight about 4 pounds higher.  Her BNP is only mildly elevated at 129, but it is higher than previous baseline around 60.  Despite aggressive diuresis, she still has normal creatinine and potassium levels.  Normal lower extremity venous Dopplers in January 2021.  She has a long history of coronary disease. She underwent proximal LAD bare-metal stenting and balloon angioplasty of the mid LAD in 2013. She has had persistent ischemia in the territory of the diagonal artery on nuclear stress test performed over the last few years. Coronary angiography performed May 2016 showed a widely patent LAD stent, 60% ostial circumflex stenosis, 20-30% right coronary artery stenosis. There was concern about possible left  coronary artery stenosis but fractional flow reserve was normal (0.96 at baseline, 0.91 during intravenous adenosine infusion). She had good anginal response to Ranexa but developed severe constipation and could not tolerate the medication. She has been intolerant to beta blockers due to bradycardia. She has preserved left ventricular systolic function but has had episodes of acute exacerbation of diastolic heart failure attributed to hypertensive heart disease. December 2015, she was critically ill with an acute left iliofemoral DVT with anticoagulation complicated by retroperitoneal hematoma and hemorrhagic shock, requiring placement of an inferior vena cava filter. The filter was removed in June 2016. Coronary angiography in July 2018 showed unchanged anatomy of the eccentric stenosis in the left main coronary artery with noncritical FFR (0.89 in the LAD, 0.86 in the LCx).  Intravascular Pressure Wire/FFR Study  Left Heart Cath and Coronary Angiography  Conclusion     LM lesion, 70 %stenosed.  Mid LAD lesion, 0 %stenosed at site of prior stent.  Ost Cx lesion, 60 %stenosed.  Prox RCA-1 lesion, 20 %stenosed.  Prox RCA-2 lesion, 30 %stenosed.  The left ventricular systolic function is normal.  LV end diastolic pressure is normal.  The left ventricular ejection fraction is 55-65% by visual estimate.   1. 70% eccentric distal left main stenosis. Angiographically similar to 2016. FFR 0.89 into the LAD and 0.86 into the LCx suggesting that this lesion is not flow limiting.  2. Otherwise nonobstructive CAD. Patent stent in LAD. 3. Normal LV function 4. Normal LVEDP  Plan: recommend continued medical  therapy.     Past Medical History:  Diagnosis Date  . Arthritis    right knee  . Cancer [facility]) yrs ago   skin cancer removed from face  . Chronic diastolic CHF (congestive heart failure) ([place])   . Chronic venous insufficiency    LEA VENOUS, 10/17/2011 - mild reflux in bilateral common  femoral veins  . CKD (chronic kidney disease), stage III   . Coronary artery disease    a. s/p PCI/BMS to prox LAD and balloon angioplasty to mLAD with suboptimal result in 2000. b. Abnl nuc 2012, cath 09/2011 - showed that the overall territory of potential ischemia was small and attributable to a moderately diseased small second diagonal artery. Med rx.  Marland Kitchen Dyspnea    with activity  . Headache   . History of blood transfusion 2017  . Hyperlipidemia   . Hypertension   . Hypertensive heart disease   . Hypothyroidism   . Obesity   . Pneumonia    several times  . Stroke [facility])    pt. states she had "light stroke" in sept. 1980  . Urinary incontinence     Past Surgical History:  Procedure Laterality Date  . ABDOMINAL HYSTERECTOMY  1970's   complete  . ANKLE ARTHROSCOPY Right 07/10/2017   Procedure: ANKLE ARTHROSCOPY;  Surgeon: [name], DPM;  Location: [place];  Service: Podiatry;  Laterality: Right;  . BACK SURGERY  07/26/2016   cervical neck surgery, [facility]  . BLADDER SURGERY  2010   WITH MESH  bladder tach  . bunion removal surgery Bilateral 15 yrs ago  . CARDIAC CATHETERIZATION Left 09/25/2011   Medical management  . CARDIAC CATHETERIZATION Left 03/25/2001   Normal LV function, LAD residual narrowing of less than 10%, normal ramus intermediate, circumflex, and RCA,   . CARDIAC CATHETERIZATION  09/04/1999   LAD, 3x68mm Tetra stent resulting in a reduction of the 80% stenosis to 0% residual  . CARDIAC CATHETERIZATION N/A 01/26/2015   Procedure: Right/Left Heart Cath and Coronary Angiography;  Surgeon: Troy Sine, MD;  Location: [place] CV LAB;  Service: Cardiovascular;  Laterality: N/A;  . CARDIAC CATHETERIZATION N/A 01/27/2015   Procedure: Intravascular Pressure Wire/FFR Study;  Surgeon: Burnell Blanks, MD;  Location: [place] CV LAB;  Service: Cardiovascular;  Laterality: N/A;  . CARDIAC CATHETERIZATION N/A 01/27/2015   Procedure: Right  Heart Cath;  Surgeon: Burnell Blanks, MD;  Location: [place] CV LAB;  Service: Cardiovascular;  Laterality: N/A;  . CHOLECYSTECTOMY    . COLONOSCOPY WITH PROPOFOL N/A 03/07/2017   Procedure: COLONOSCOPY WITH PROPOFOL;  Surgeon: Juanita Craver, MD;  Location: [facility];  Service: Endoscopy;  Laterality: N/A;  . CORONARY STENT PLACEMENT     LAD x 1  . ganglion cyst removal  yrs ago   x 2  . ILIAC VEIN ANGIOPLASTY / STENTING  02/15/2015  . INTRAVASCULAR PRESSURE WIRE/FFR STUDY N/A 04/05/2017   Procedure: Intravascular Pressure Wire/FFR Study;  Surgeon: Martinique, Peter M, MD;  Location: [place] CV LAB;  Service: Cardiovascular;  Laterality: N/A;  . IVC FILTER INSERTION  2016  . IVC FILTER REMOVAL  02/15/2015   at [facility]  . LEFT HEART CATH AND CORONARY ANGIOGRAPHY N/A 04/05/2017   Procedure: Left Heart Cath and Coronary Angiography;  Surgeon: Martinique, Peter M, MD;  Location: [place] CV LAB;  Service: Cardiovascular;  Laterality: N/A;  . LEFT HEART CATHETERIZATION WITH CORONARY ANGIOGRAM N/A 09/25/2011   Procedure: LEFT HEART CATHETERIZATION WITH  CORONARY ANGIOGRAM;  Surgeon: Sanda Klein, MD;  Location: [facility] CATH LAB;  Service: Cardiovascular;  Laterality: N/A;  . multiple bladder surgeries to remove mesh    . ROTATOR CUFF REPAIR Right   . stent to groin Left 08/2014   left leg  . TENDON REPAIR Right 07/10/2017   Procedure: RIGHT PERONEAL TENDON REPAIR;  Surgeon: [name], DPM;  Location: [place];  Service: Podiatry;  Laterality: Right;  . TENDON REPAIR Right 05/27/2019   Procedure: PERONEAL TENDON REPAIR x2;  Surgeon: [name], DPM;  Location: [facility];  Service: Podiatry;  Laterality: Right;    Outpatient Medications Prior to Visit  Medication Sig Dispense Refill  . acetaminophen (TYLENOL) 500 MG tablet Take 1,000 mg by mouth at bedtime.    Marland Kitchen amLODipine (NORVASC) 2.5 MG tablet Take 1 tablet (2.5 mg total) by mouth daily. 90 tablet 1  . Ascorbic Acid (VITAMIN C) 1000  MG tablet Take 1,000 mg by mouth daily.     Marland Kitchen aspirin EC 81 MG tablet Take 1 tablet (81 mg total) by mouth daily. 90 tablet 3  . bisacodyl (DULCOLAX) 5 MG EC tablet Take 10 mg by mouth daily.     . cholecalciferol (VITAMIN D3) 25 MCG (1000 UNIT) tablet Take 1,000 Units by mouth daily.     . clobetasol ointment (TEMOVATE) 5.64 % Apply 1 application topically daily as needed (rash).     . ezetimibe (ZETIA) 10 MG tablet Take 1 tablet (10 mg total) by mouth daily. 90 tablet 3  . furosemide (LASIX) 80 MG tablet Take 1 tablet (80 mg total) by mouth daily.    . isosorbide mononitrate (IMDUR) 30 MG 24 hr tablet Take 2 tablets (60 mg total) by mouth daily.    Marland Kitchen KLOR-CON M20 20 MEQ tablet Take 2 tablets (40 mEq total) by mouth daily. (Patient taking differently: Take 20 mEq by mouth daily. ) 180 tablet 3  . levothyroxine (SYNTHROID, LEVOTHROID) 112 MCG tablet Take 112 mcg by mouth daily before breakfast.   4  . Multiple Vitamins-Minerals (HAIR/SKIN/NAILS/BIOTIN PO) Take 2 tablets by mouth daily.    . nitroGLYCERIN (NITROSTAT) 0.4 MG SL tablet Place 1 tablet (0.4 mg total) under the tongue every 5 (five) minutes as needed for chest pain. 25 tablet 2  . nystatin (MYCOSTATIN/NYSTOP) powder Apply 1 application topically daily as needed (rash).     Marland Kitchen OVER THE COUNTER MEDICATION Take 1 Scoop by mouth daily. "Ruby Red" - mix in fluid and drink    . OVER THE COUNTER MEDICATION Take 1 tablet by mouth daily. "Focus factor"    . pravastatin (PRAVACHOL) 80 MG tablet Take 80 mg by mouth daily.     . metolazone (ZAROXOLYN) 2.5 MG tablet Take 1 tablet (2.5 mg total) by mouth as needed. Take 2.5 mg 30 min before AM dose of Lasix (only take when instructed to do so by Dr. Sallyanne Kuster) (Patient taking differently: Take 2.5 mg by mouth daily as needed (when instructed to do so by Dr. Sallyanne Kuster). ) 30 tablet 6   No facility-administered medications prior to visit.     Allergies:   Atorvastatin, Penicillins, Shellfish allergy,  Aminophylline, Iodinated diagnostic agents, Latex, Oxybutynin chloride, Vancomycin, Adhesive [tape], Betadine [povidone iodine], Ciprofloxacin, Codeine, and Ranolazine   Social History   Socioeconomic History  . Marital status: Married    Spouse name: Not on file  . Number of children: Not on file  . Years of education: Not on file  . Highest  education level: Not on file  Occupational History  . Not on file  Tobacco Use  . Smoking status: Never Smoker  . Smokeless tobacco: Never Used  Vaping Use  . Vaping Use: Never used  Substance and Sexual Activity  . Alcohol use: No  . Drug use: No  . Sexual activity: Not on file  Other Topics Concern  . Not on file  Social History Narrative  . Not on file   Social Determinants of Health   Financial Resource Strain:   . Difficulty of Paying Living Expenses:   Food Insecurity: No Food Insecurity  . Worried About Charity fundraiser in the Last Year: Never true  . Ran Out of Food in the Last Year: Never true  Transportation Needs: No Transportation Needs  . Lack of Transportation (Medical): No  . Lack of Transportation (Non-Medical): No  Physical Activity:   . Days of Exercise per Week:   . Minutes of Exercise per Session:   Stress:   . Feeling of Stress :   Social Connections:   . Frequency of Communication with Friends and Family:   . Frequency of Social Gatherings with Friends and Family:   . Attends Religious Services:   . Active Member of Clubs or Organizations:   . Attends Archivist Meetings:   Marland Kitchen Marital Status:      Family History:  The patient's family history includes Cancer in her mother; Heart disease in her brother, father, and sister.   ROS:   Please see the history of present illness.    ROS All other systems are reviewed and are negative.   PHYSICAL EXAM:   VS:  BP (!) 155/67   Pulse 65   Ht 5\' 5"  (1.651 m)   Wt 207 lb 3.2 oz (94 kg)   SpO2 97%   BMI 34.48 kg/m       General: Alert,  oriented x3, no distress, moderately obese Head: no evidence of trauma, PERRL, EOMI, no exophtalmos or lid lag, no myxedema, no xanthelasma; normal ears, nose and oropharynx Neck: normal jugular venous pulsations and no hepatojugular reflux; brisk carotid pulses without delay and no carotid bruits Chest: clear to auscultation, no signs of consolidation by percussion or palpation, normal fremitus, symmetrical and full respiratory excursions Cardiovascular: normal position and quality of the apical impulse, regular rhythm, normal first and second heart sounds, no murmurs, rubs or gallops Abdomen: no tenderness or distention, no masses by palpation, no abnormal pulsatility or arterial bruits, normal bowel sounds, no hepatosplenomegaly Extremities: no clubbing, cyanosis or edema; 2+ radial, ulnar and brachial pulses bilaterally; 2+ right femoral, posterior tibial and dorsalis pedis pulses; 2+ left femoral, posterior tibial and dorsalis pedis pulses; no subclavian or femoral bruits Neurological: grossly nonfocal Psych: Normal mood and affect    Wt Readings from Last 3 Encounters:  04/25/20 207 lb 3.2 oz (94 kg)  11/18/19 208 lb (94.3 kg)  10/22/19 212 lb (96.2 kg)      Studies/Labs Reviewed:   EKG:  EKG is ordered today.  It shows normal sinus rhythm, normal tracing except for borderline QTC 451 ms  Recent Labs: 10/22/2019: ALT 38; Hemoglobin 15.7; Platelets 160 04/11/2020: BNP 129.1; BUN 22; Creatinine, Ser 0.91; Magnesium 1.9; Potassium 4.1; Sodium 142   Lipid Panel    Component Value Date/Time   CHOL 193 11/18/2017 0857   TRIG 212 (H) 11/18/2017 0857   HDL 47 11/18/2017 0857   CHOLHDL 4.1 11/18/2017 0857   CHOLHDL 4.2  06/27/2015 1336   VLDL 48 (H) 06/27/2015 1336   LDLCALC 104 (H) 11/18/2017 0857   Labs November 04, 2018 Total cholesterol 159, HDL 59, LDL 77 Normal liver function tests, creatinine 0.99, potassium 3.7   11/16/2019 Hemoglobin 14.9, creatinine 0.85, glucose 89,  liver function tests are all normal except for alkaline phosphatase 132 Total cholesterol 136, triglycerides 97, HDL 62, LDL 56 TSH 6.99   Labs 03/23/2020 Total cholesterol 145, HDL 55, LDL 69, triglycerides 118 TSH 0.886   CATH 04/05/2017   LM lesion, 70 %stenosed.  Mid LAD lesion, 0 %stenosed at site of prior stent.  Ost Cx lesion, 60 %stenosed.  Prox RCA-1 lesion, 20 %stenosed.  Prox RCA-2 lesion, 30 %stenosed.  The left ventricular systolic function is normal.  LV end diastolic pressure is normal.  The left ventricular ejection fraction is 55-65% by visual estimate.   1. 70% eccentric distal left main stenosis. Angiographically similar to 2016. FFR 0.89 into the LAD and 0.86 into the LCx suggesting that this lesion is not flow limiting.  2. Otherwise nonobstructive CAD. Patent stent in LAD. 3. Normal LV function 4. Normal LVEDP  Plan: recommend continued medical therapy.   ASSESSMENT:    1. Chronic diastolic heart failure ([place])   2. Coronary artery disease involving native coronary artery of native heart without angina pectoris   3. Essential hypertension   4. PAD (peripheral artery disease) ([place])   5. Hypercholesterolemia   6. Moderate obesity      PLAN:  In order of problems listed above:  1. CHF: Clinical volume status assessment is difficult due to obesity, but both her BNP and her echo suggested that she still has volume overload.  We will continue her loop diuretic and try a regimen of twice weekly dosing of metolazone to see if this leads to improvement in her volume status. 2. CAD: Angina pectoris is not a current complaint.  She is known to have a moderate stenosis of the left coronary artery that has been assessed twice in 2016 and 2018, including with fractional flow reserve and found not to be significant.  If we cannot get her heart failure compensated may have to repeat her left heart catheterization.  She did not tolerate beta-blockers due to  bradycardia in the ranolazine due to constipation.  Note that at 1 point she had shortness of breath that responded to treatment with amlodipine, suggesting it may have been an anginal equivalent and may be related to vasospasm.  She is still on amlodipine now. 3. HTN: Has lower blood pressure in her left arm, always check on the right.  Blood pressure is mildly high today. 4. PAD: Asymptomatic.  Blood pressure is lower in the left arm, but she does not have claudication or subclavian steal syndrome symptoms. 5. Hyperlipidemia: All lipid parameters are acceptable within target range.  Continue the current lipid-lowering regimen with pravastatin and ezetimibe. 6. Obesity: Moderately obese.  True weight has varied some making assessment of her dry weight a little tricky.   Medication Adjustments/Labs and Tests Ordered: Current medicines are reviewed at length with the patient today.  Concerns regarding medicines are outlined above.  Medication changes, Labs and Tests ordered today are listed in the Patient Instructions below. Patient Instructions  Medication Instructions:  METOLAZONE: Take one 2.5 mg tablet twice weekly on Tuesday and Fridays.  *If you need a refill on your cardiac medications before your next appointment, please call your pharmacy*   Lab Work: Your provider would like  for you to return in 2 weeks to have the following labs drawn: BMET. You do not need an appointment for the lab. Once in our office lobby there is a podium where you can sign in and ring the doorbell to alert [place] that you are here. The lab is open from 8:00 am to 4:30 pm; closed for lunch from 12:45pm-1:45pm.  If you have labs (blood work) drawn today and your tests are completely normal, you will receive your results only by: Marland Kitchen MyChart Message (if you have MyChart) OR . A paper copy in the mail If you have any lab test that is abnormal or we need to change your treatment, we will call you to review the  results.   Testing/Procedures: None ordered   Follow-Up: At [facility], you and your health needs are our priority.  As part of our continuing mission to provide you with exceptional heart care, we have created designated Provider Care Teams.  These Care Teams include your primary Cardiologist (physician) and Advanced Practice Providers (APPs -  Physician Assistants and Nurse Practitioners) who all work together to provide you with the care you need, when you need it.  We recommend signing up for the patient portal called "MyChart".  Sign up information is provided on this After Visit Summary.  MyChart is used to connect with patients for Virtual Visits (Telemedicine).  Patients are able to view lab/test results, encounter notes, upcoming appointments, etc.  Non-urgent messages can be sent to your provider as well.   To learn more about what you can do with MyChart, go to [email or web address].    Your next appointment:   Keep your appointment on 05/18/20 at 9 am with Dr. Sallyanne Kuster       Signed, Sanda Klein, MD  04/27/2020 6:44 PM    [address], [place], [place]  [address] Phone: [phone]; Fax: [phone]

## 2020-04-29 ENCOUNTER — Telehealth: Payer: Self-pay | Admitting: Cardiovascular Disease

## 2020-04-29 NOTE — Telephone Encounter (Signed)
Patient states her urethra is bleeding. She does not know what is going on. She states she is so sore.   She has a urologist with [facility] - provided her with name and phone number of MD she has seen before. She will call their office

## 2020-04-29 NOTE — Telephone Encounter (Signed)
Patient called and said she is urinating blood. She attempted to call her PCP but their office is closed today

## 2020-05-03 ENCOUNTER — Other Ambulatory Visit: Payer: Self-pay

## 2020-05-03 ENCOUNTER — Ambulatory Visit
Admission: RE | Admit: 2020-05-03 | Discharge: 2020-05-03 | Disposition: A | Discharge status: Home / Self Care | Payer: HMO | Source: Ambulatory Visit | Attending: Podiatrist | Admitting: Podiatrist

## 2020-05-03 DIAGNOSIS — M19071 Primary osteoarthritis, right ankle and foot: Secondary | ICD-10-CM | POA: Diagnosis not present

## 2020-05-03 DIAGNOSIS — M25471 Effusion, right ankle: Secondary | ICD-10-CM | POA: Diagnosis not present

## 2020-05-03 DIAGNOSIS — S93491A Sprain of other ligament of right ankle, initial encounter: Secondary | ICD-10-CM | POA: Diagnosis not present

## 2020-05-03 DIAGNOSIS — R600 Localized edema: Secondary | ICD-10-CM | POA: Diagnosis not present

## 2020-05-04 DIAGNOSIS — N766 Ulceration of vulva: Secondary | ICD-10-CM | POA: Diagnosis not present

## 2020-05-04 DIAGNOSIS — I5032 Chronic diastolic (congestive) heart failure: Secondary | ICD-10-CM | POA: Diagnosis not present

## 2020-05-04 DIAGNOSIS — R82998 Other abnormal findings in urine: Secondary | ICD-10-CM | POA: Diagnosis not present

## 2020-05-04 DIAGNOSIS — N904 Leukoplakia of vulva: Secondary | ICD-10-CM | POA: Diagnosis not present

## 2020-05-04 DIAGNOSIS — R310 Gross hematuria: Secondary | ICD-10-CM | POA: Diagnosis not present

## 2020-05-04 DIAGNOSIS — R32 Unspecified urinary incontinence: Secondary | ICD-10-CM | POA: Diagnosis not present

## 2020-05-05 ENCOUNTER — Other Ambulatory Visit: Payer: Self-pay | Admitting: Cardiovascular Disease

## 2020-05-05 MED ORDER — KLOR-CON M20 20 MEQ PO TBCR: 20.0000 meq | EXTENDED_RELEASE_TABLET | Freq: Every day | ORAL | 3 refills | Status: DC

## 2020-05-05 NOTE — Telephone Encounter (Signed)
°*  STAT* If patient is at the pharmacy, call can be transferred to refill team.   1. Which medications need to be refilled? (please list name of each medication and dose if known)  Potassium  2. Which pharmacy/location (including street and city if local pharmacy) is medication to be sent to? [facility], [place] - [address] AT [place]  3. Do they need a 30 day or 90 day supply? 30    Patient also states that if any other medications are about to run out then please refill those as well.Marland KitchenMarland Kitchen

## 2020-05-06 ENCOUNTER — Other Ambulatory Visit: Payer: Self-pay | Admitting: Cardiovascular Disease

## 2020-05-09 NOTE — Telephone Encounter (Signed)
Called patient to discuss MRI result.  Informed her there is no acute tendon tear or ligament injury.  Recommended continued use of an ankle brace or bandage to help with swelling.   Discussed in brief she may be a candidate for an AFO.  She will call to set up an appointment with Dr. March Rummage for further treatment options when she is ready (she has a surgery to get past first).  Trudie Buckler

## 2020-05-12 ENCOUNTER — Telehealth: Payer: Self-pay | Admitting: Cardiovascular Disease

## 2020-05-12 NOTE — Telephone Encounter (Signed)
° °  [facility] Pre-operative Risk Assessment     Request for surgical clearance:  1. What type of surgery is being performed? Suprapubic catheter placement with botox injections, perineal biopsies   2. When is this surgery scheduled? 05/17/2020   3. What type of clearance is required (medical clearance vs. Pharmacy clearance to hold med vs. Both)? medical  4. Are there any medications that need to be held prior to surgery and how long? No   5. Practice name and name of physician performing surgery? [facility] Dr.  Carlota Raspberry  6. What is the office phone number? [phone]   7.   What is the office fax number? [phone]  8.   Anesthesia type (None, local, MAC, general) ?    Jill Preston Jill Preston 05/12/2020, 12:19 PM  _________________________________________________________________   Please notify patient as well.

## 2020-05-12 NOTE — Telephone Encounter (Signed)
Patient called stating she is having surgery next week Tuesday. She states they are going to put a pelvic tube, and do bladder biopsy. She wants to know if this will be okay.

## 2020-05-12 NOTE — Telephone Encounter (Signed)
Left voicemail. Need to evaluate her breathing.

## 2020-05-13 NOTE — Telephone Encounter (Signed)
LVM 8:50 am 05/13/2020

## 2020-05-13 NOTE — Telephone Encounter (Signed)
Follow  Up:     Pt is returning  A call from yesterday. 

## 2020-05-13 NOTE — Telephone Encounter (Signed)
   Primary Cardiologist: Dr.Croituro   Chart reviewed as part of pre-operative protocol coverage. Given past medical history and time since last visit, based on ACC/AHA guidelines, Jill Preston would be at acceptable risk for the planned procedure without further cardiovascular testing.   I spoke with patient by phone to evaluate her breathing and overall status. She appeared stable.   I will route this recommendation to the requesting party via Epic fax function and remove from pre-op pool.  Please call with questions.  Phill Myron. Ezella Kell DNP, ANP, AACC  05/13/2020, 9:09 AM

## 2020-05-16 ENCOUNTER — Other Ambulatory Visit: Payer: Self-pay

## 2020-05-16 NOTE — Patient Outreach (Signed)
  [place] [facility]) Care Management Chronic Special Needs Program    05/16/2020  Name: Jill Preston, DOB: June 21, 1933  MRN: [id number]   Ms. [name] is enrolled in a chronic special needs plan for Heart Failure. RNCM received message from [employer] that client had called to get [facility] contact number as she would like follow up after her 05/17/20 outpatient surgery. RNCM communicated with [place] to follow up with client RNCM will continue to follow client as per tier level Reviewed [place] medical record and client was contacted 05/13/20 to follow up from call to [employer]), gave client [facility] number and they plan to follow up with client after her surgery [place], Jackquline [place], [place] [facility] [phone]

## 2020-05-17 DIAGNOSIS — I5032 Chronic diastolic (congestive) heart failure: Secondary | ICD-10-CM | POA: Diagnosis not present

## 2020-05-17 DIAGNOSIS — N3281 Overactive bladder: Secondary | ICD-10-CM | POA: Diagnosis not present

## 2020-05-17 DIAGNOSIS — Z90710 Acquired absence of both cervix and uterus: Secondary | ICD-10-CM | POA: Diagnosis not present

## 2020-05-17 DIAGNOSIS — M5136 Other intervertebral disc degeneration, lumbar region: Secondary | ICD-10-CM | POA: Diagnosis not present

## 2020-05-17 DIAGNOSIS — E039 Hypothyroidism, unspecified: Secondary | ICD-10-CM | POA: Diagnosis not present

## 2020-05-17 DIAGNOSIS — G309 Alzheimer's disease, unspecified: Secondary | ICD-10-CM | POA: Diagnosis not present

## 2020-05-17 DIAGNOSIS — I130 Hypertensive heart and chronic kidney disease with heart failure and stage 1 through stage 4 chronic kidney disease, or unspecified chronic kidney disease: Secondary | ICD-10-CM | POA: Diagnosis not present

## 2020-05-17 DIAGNOSIS — R102 Pelvic and perineal pain: Secondary | ICD-10-CM | POA: Diagnosis not present

## 2020-05-17 DIAGNOSIS — N766 Ulceration of vulva: Secondary | ICD-10-CM | POA: Diagnosis not present

## 2020-05-17 DIAGNOSIS — Z86718 Personal history of other venous thrombosis and embolism: Secondary | ICD-10-CM | POA: Diagnosis not present

## 2020-05-17 DIAGNOSIS — N3946 Mixed incontinence: Secondary | ICD-10-CM | POA: Diagnosis not present

## 2020-05-17 DIAGNOSIS — E785 Hyperlipidemia, unspecified: Secondary | ICD-10-CM | POA: Diagnosis not present

## 2020-05-17 DIAGNOSIS — I2510 Atherosclerotic heart disease of native coronary artery without angina pectoris: Secondary | ICD-10-CM | POA: Diagnosis not present

## 2020-05-17 DIAGNOSIS — R3914 Feeling of incomplete bladder emptying: Secondary | ICD-10-CM | POA: Diagnosis not present

## 2020-05-17 DIAGNOSIS — L900 Lichen sclerosus et atrophicus: Secondary | ICD-10-CM | POA: Diagnosis not present

## 2020-05-17 DIAGNOSIS — L98499 Non-pressure chronic ulcer of skin of other sites with unspecified severity: Secondary | ICD-10-CM | POA: Diagnosis not present

## 2020-05-17 DIAGNOSIS — F0280 Dementia in other diseases classified elsewhere without behavioral disturbance: Secondary | ICD-10-CM | POA: Diagnosis not present

## 2020-05-17 DIAGNOSIS — Z955 Presence of coronary angioplasty implant and graft: Secondary | ICD-10-CM | POA: Diagnosis not present

## 2020-05-18 ENCOUNTER — Ambulatory Visit: Payer: HMO | Admitting: Cardiovascular Disease

## 2020-06-06 ENCOUNTER — Other Ambulatory Visit: Payer: Self-pay | Admitting: Cardiovascular Disease

## 2020-06-19 ENCOUNTER — Other Ambulatory Visit: Payer: Self-pay | Admitting: Cardiovascular Disease

## 2020-07-25 DIAGNOSIS — Z466 Encounter for fitting and adjustment of urinary device: Secondary | ICD-10-CM | POA: Diagnosis not present

## 2020-08-04 ENCOUNTER — Other Ambulatory Visit: Payer: Self-pay

## 2020-08-04 NOTE — Patient Outreach (Signed)
  [facility]) Care Management Chronic Special Needs Program    08/04/2020  Name: Jill Preston, DOB: 1932-10-24  MRN: [id number]   Jill Preston is enrolled in a chronic special needs plan for Heart Failure.  [employer] Care Management Team has assumed care and services for this member. Case closed by [place] RN, [facility], [place] Management [phone]

## 2020-08-10 ENCOUNTER — Ambulatory Visit: Payer: HMO | Admitting: Cardiovascular Disease

## 2020-08-10 ENCOUNTER — Encounter: Payer: Self-pay | Admitting: Cardiovascular Disease

## 2020-08-10 ENCOUNTER — Other Ambulatory Visit: Payer: Self-pay

## 2020-08-10 VITALS — BP 142/68 | HR 89 | Ht 65.5 in | Wt 212.8 lb

## 2020-08-10 DIAGNOSIS — I10 Essential (primary) hypertension: Secondary | ICD-10-CM

## 2020-08-10 DIAGNOSIS — I739 Peripheral vascular disease, unspecified: Secondary | ICD-10-CM | POA: Diagnosis not present

## 2020-08-10 DIAGNOSIS — I2510 Atherosclerotic heart disease of native coronary artery without angina pectoris: Secondary | ICD-10-CM

## 2020-08-10 DIAGNOSIS — E782 Mixed hyperlipidemia: Secondary | ICD-10-CM | POA: Diagnosis not present

## 2020-08-10 DIAGNOSIS — I5032 Chronic diastolic (congestive) heart failure: Secondary | ICD-10-CM | POA: Diagnosis not present

## 2020-08-10 DIAGNOSIS — E668 Other obesity: Secondary | ICD-10-CM

## 2020-08-10 DIAGNOSIS — E669 Obesity, unspecified: Secondary | ICD-10-CM

## 2020-08-10 LAB — SPECIMEN STATUS REPORT

## 2020-08-10 NOTE — Progress Notes (Signed)
Patient ID: Jill Preston, female   DOB: 19-Oct-1932, 86 y.o.   MRN: [id number] Patient ID: Jill Preston, female   DOB: [birth year], 86 y.o.   MRN: [id number]    Cardiology Office Note    Date:  08/12/2020   ID:  Jill Preston, DOB May 23, 1933, MRN [id number]  PCP:  Merrilee Seashore, MD  Cardiologist:   Sanda Klein, MD   chief complaint: Fatigue, dyspnea.  History of Present Illness:  Jill Preston is a 86 y.o. female returning in follow-up for CAD and CHF.  She continues to be a little overwhelmed by multiple health problems.  She had to have vaginal surgery which thankfully showed no evidence of residual cancer.  She has a suprapubic catheter in place and this might be there for total of 6 to 12 months.  She fell and tore a ligament in her right ankle.  She wakes up in the morning on with difficult breathing and wheezing that improves after she coughs.  She chronically sleeps with her head of bed elevated about 30 to 40 degrees.  She has less ankle edema compared to the past.  She is also frustrated by her husband's increasing memory difficulties, stubbornness and crankiness.  He refuses to follow advice and has had a couple of falls since he did not want to use a walker.  Increasing her metolazone to twice a week did not work this time.  Her weight is still up above our target of around 207 pounds on our office scale and she is still short of breath.  Denies dizziness, syncope or palpitations.  She does not have frank orthopnea or PND, but as mentioned, she chronically sleeps with the head of her adjustable bed elevated at a significant angle.  Most recent labs show a creatinine of 0.9 and a normal potassium level.  Most recent LDL cholesterol is less than 70.  Normal lower extremity venous Dopplers in January 2021.  She has a long history of coronary disease. She underwent proximal LAD bare-metal stenting and balloon angioplasty of the mid LAD in 2013. She has had  persistent ischemia in the territory of the diagonal artery on nuclear stress test performed over the last few years. Coronary angiography performed May 2016 showed a widely patent LAD stent, 60% ostial circumflex stenosis, 20-30% right coronary artery stenosis. There was concern about possible left coronary artery stenosis but fractional flow reserve was normal (0.96 at baseline, 0.91 during intravenous adenosine infusion). She had good anginal response to Ranexa but developed severe constipation and could not tolerate the medication. She has been intolerant to beta blockers due to bradycardia. She has preserved left ventricular systolic function but has had episodes of acute exacerbation of diastolic heart failure attributed to hypertensive heart disease. December 2015, she was critically ill with an acute left iliofemoral DVT with anticoagulation complicated by retroperitoneal hematoma and hemorrhagic shock, requiring placement of an inferior vena cava filter. The filter was removed in June 2016. Coronary angiography in July 2018 showed unchanged anatomy of the eccentric stenosis in the left main coronary artery with noncritical FFR (0.89 in the LAD, 0.86 in the LCx).  Intravascular Pressure Wire/FFR Study  Left Heart Cath and Coronary Angiography  Conclusion     LM lesion, 70 %stenosed.  Mid LAD lesion, 0 %stenosed at site of prior stent.  Ost Cx lesion, 60 %stenosed.  Prox RCA-1 lesion, 20 %stenosed.  Prox RCA-2 lesion, 30 %stenosed.  The left ventricular systolic function is normal.  LV  end diastolic pressure is normal.  The left ventricular ejection fraction is 55-65% by visual estimate.   1. 70% eccentric distal left main stenosis. Angiographically similar to 2016. FFR 0.89 into the LAD and 0.86 into the LCx suggesting that this lesion is not flow limiting.  2. Otherwise nonobstructive CAD. Patent stent in LAD. 3. Normal LV function 4. Normal LVEDP  Plan: recommend continued  medical therapy.     Past Medical History:  Diagnosis Date  . Arthritis    right knee  . Cancer [facility]) yrs ago   skin cancer removed from face  . Chronic diastolic CHF (congestive heart failure) ([place])   . Chronic venous insufficiency    LEA VENOUS, 10/17/2011 - mild reflux in bilateral common femoral veins  . CKD (chronic kidney disease), stage III ([place])   . Coronary artery disease    a. s/p PCI/BMS to prox LAD and balloon angioplasty to mLAD with suboptimal result in 2000. b. Abnl nuc 2012, cath 09/2011 - showed that the overall territory of potential ischemia was small and attributable to a moderately diseased small second diagonal artery. Med rx.  Marland Kitchen Dyspnea    with activity  . Headache   . History of blood transfusion 2017  . Hyperlipidemia   . Hypertension   . Hypertensive heart disease   . Hypothyroidism   . Obesity   . Pneumonia    several times  . Stroke [facility])    pt. states she had "light stroke" in sept. 1980  . Urinary incontinence     Past Surgical History:  Procedure Laterality Date  . ABDOMINAL HYSTERECTOMY  1970's   complete  . ANKLE ARTHROSCOPY Right 07/10/2017   Procedure: ANKLE ARTHROSCOPY;  Surgeon: [name], DPM;  Location: [place];  Service: Podiatry;  Laterality: Right;  . BACK SURGERY  07/26/2016   cervical neck surgery,  surgical center  . BLADDER SURGERY  2010   WITH MESH  bladder tach  . bunion removal surgery Bilateral 15 yrs ago  . CARDIAC CATHETERIZATION Left 09/25/2011   Medical management  . CARDIAC CATHETERIZATION Left 03/25/2001   Normal LV function, LAD residual narrowing of less than 10%, normal ramus intermediate, circumflex, and RCA,   . CARDIAC CATHETERIZATION  09/04/1999   LAD, 3x12mm Tetra stent resulting in a reduction of the 80% stenosis to 0% residual  . CARDIAC CATHETERIZATION N/A 01/26/2015   Procedure: Right/Left Heart Cath and Coronary Angiography;  Surgeon: Troy Sine, MD;  Location: [place] CV LAB;   Service: Cardiovascular;  Laterality: N/A;  . CARDIAC CATHETERIZATION N/A 01/27/2015   Procedure: Intravascular Pressure Wire/FFR Study;  Surgeon: Burnell Blanks, MD;  Location: [place] CV LAB;  Service: Cardiovascular;  Laterality: N/A;  . CARDIAC CATHETERIZATION N/A 01/27/2015   Procedure: Right Heart Cath;  Surgeon: Burnell Blanks, MD;  Location: [place] CV LAB;  Service: Cardiovascular;  Laterality: N/A;  . CHOLECYSTECTOMY    . COLONOSCOPY WITH PROPOFOL N/A 03/07/2017   Procedure: COLONOSCOPY WITH PROPOFOL;  Surgeon: Juanita Craver, MD;  Location: [facility];  Service: Endoscopy;  Laterality: N/A;  . CORONARY STENT PLACEMENT     LAD x 1  . ganglion cyst removal  yrs ago   x 2  . ILIAC VEIN ANGIOPLASTY / STENTING  02/15/2015  . INTRAVASCULAR PRESSURE WIRE/FFR STUDY N/A 04/05/2017   Procedure: Intravascular Pressure Wire/FFR Study;  Surgeon: Martinique, Peter M, MD;  Location: [place] CV LAB;  Service: Cardiovascular;  Laterality: N/A;  . IVC  FILTER INSERTION  2016  . IVC FILTER REMOVAL  02/15/2015   at [facility]  . LEFT HEART CATH AND CORONARY ANGIOGRAPHY N/A 04/05/2017   Procedure: Left Heart Cath and Coronary Angiography;  Surgeon: Martinique, Peter M, MD;  Location: [place] CV LAB;  Service: Cardiovascular;  Laterality: N/A;  . LEFT HEART CATHETERIZATION WITH CORONARY ANGIOGRAM N/A 09/25/2011   Procedure: LEFT HEART CATHETERIZATION WITH CORONARY ANGIOGRAM;  Surgeon: Sanda Klein, MD;  Location: [place] CATH LAB;  Service: Cardiovascular;  Laterality: N/A;  . multiple bladder surgeries to remove mesh    . ROTATOR CUFF REPAIR Right   . stent to groin Left 08/2014   left leg  . TENDON REPAIR Right 07/10/2017   Procedure: RIGHT PERONEAL TENDON REPAIR;  Surgeon: [name], DPM;  Location: [place];  Service: Podiatry;  Laterality: Right;  . TENDON REPAIR Right 05/27/2019   Procedure: PERONEAL TENDON REPAIR x2;  Surgeon: [name], DPM;  Location: [facility];  Service:  Podiatry;  Laterality: Right;    Outpatient Medications Prior to Visit  Medication Sig Dispense Refill  . acetaminophen (TYLENOL) 500 MG tablet Take 1,000 mg by mouth at bedtime.    Marland Kitchen amLODipine (NORVASC) 2.5 MG tablet TAKE 1 TABLET(2.5 MG) BY MOUTH DAILY 90 tablet 3  . Ascorbic Acid (VITAMIN C) 1000 MG tablet Take 1,000 mg by mouth daily.     Marland Kitchen aspirin EC 81 MG tablet Take 1 tablet (81 mg total) by mouth daily. 90 tablet 3  . bisacodyl (DULCOLAX) 5 MG EC tablet Take 10 mg by mouth daily.     . cholecalciferol (VITAMIN D3) 25 MCG (1000 UNIT) tablet Take 1,000 Units by mouth daily.     . clobetasol ointment (TEMOVATE) 2.20 % Apply 1 application topically daily as needed (rash).     . ezetimibe (ZETIA) 10 MG tablet Take 1 tablet (10 mg total) by mouth daily. 90 tablet 3  . furosemide (LASIX) 80 MG tablet TAKE 1 TABLET BY MOUTH AT 6 AM AND AT 1 PM 180 tablet 3  . isosorbide mononitrate (IMDUR) 30 MG 24 hr tablet Take 2 tablets (60 mg total) by mouth 2 (two) times daily. 360 tablet 3  . KLOR-CON M20 20 MEQ tablet Take 1 tablet (20 mEq total) by mouth daily. 90 tablet 3  . levothyroxine (SYNTHROID, LEVOTHROID) 112 MCG tablet Take 112 mcg by mouth daily before breakfast.   4  . Multiple Vitamins-Minerals (HAIR/SKIN/NAILS/BIOTIN PO) Take 2 tablets by mouth daily.    . nitroGLYCERIN (NITROSTAT) 0.4 MG SL tablet Place 1 tablet (0.4 mg total) under the tongue every 5 (five) minutes as needed for chest pain. 25 tablet 2  . nystatin (MYCOSTATIN/NYSTOP) powder Apply 1 application topically daily as needed (rash).     Marland Kitchen OVER THE COUNTER MEDICATION Take 1 Scoop by mouth daily. "Ruby Red" - mix in fluid and drink    . OVER THE COUNTER MEDICATION Take 1 tablet by mouth daily. "Focus factor"    . pravastatin (PRAVACHOL) 80 MG tablet Take 80 mg by mouth daily.     . metolazone (ZAROXOLYN) 2.5 MG tablet Take 1 tablet (2.5 mg total) by mouth 2 (two) times a week. Take one tablet on Tuesday and Fridays. 10 tablet 1    No facility-administered medications prior to visit.     Allergies:   Atorvastatin, Penicillins, Shellfish allergy, Aminophylline, Iodinated diagnostic agents, Latex, Oxybutynin chloride, Vancomycin, Adhesive [tape], Betadine [povidone iodine], Ciprofloxacin, Codeine, and Ranolazine   Social History  Socioeconomic History  . Marital status: Married    Spouse name: Not on file  . Number of children: Not on file  . Years of education: Not on file  . Highest education level: Not on file  Occupational History  . Not on file  Tobacco Use  . Smoking status: Never Smoker  . Smokeless tobacco: Never Used  Vaping Use  . Vaping Use: Never used  Substance and Sexual Activity  . Alcohol use: No  . Drug use: No  . Sexual activity: Not on file  Other Topics Concern  . Not on file  Social History Narrative  . Not on file   Social Determinants of Health   Financial Resource Strain:   . Difficulty of Paying Living Expenses: Not on file  Food Insecurity: No Food Insecurity  . Worried About Charity fundraiser in the Last Year: Never true  . Ran Out of Food in the Last Year: Never true  Transportation Needs: No Transportation Needs  . Lack of Transportation (Medical): No  . Lack of Transportation (Non-Medical): No  Physical Activity:   . Days of Exercise per Week: Not on file  . Minutes of Exercise per Session: Not on file  Stress:   . Feeling of Stress : Not on file  Social Connections:   . Frequency of Communication with Friends and Family: Not on file  . Frequency of Social Gatherings with Friends and Family: Not on file  . Attends Religious Services: Not on file  . Active Member of Clubs or Organizations: Not on file  . Attends Archivist Meetings: Not on file  . Marital Status: Not on file     Family History:  The patient's family history includes Cancer in her mother; Heart disease in her brother, father, and sister.   ROS:   Please see the history of present  illness.    ROS All other systems are reviewed and are negative.   PHYSICAL EXAM:   VS:  BP (!) 142/68   Pulse 89   Ht 5' 5.5" (1.664 m)   Wt 212 lb 12.8 oz (96.5 kg)   SpO2 96%   BMI 34.87 kg/m       General: Alert, oriented x3, no distress, moderately obese Head: no evidence of trauma, PERRL, EOMI, no exophtalmos or lid lag, no myxedema, no xanthelasma; normal ears, nose and oropharynx Neck: normal jugular venous pulsations and no hepatojugular reflux; brisk carotid pulses without delay and no carotid bruits Chest: clear to auscultation, no signs of consolidation by percussion or palpation, normal fremitus, symmetrical and full respiratory excursions Cardiovascular: normal position and quality of the apical impulse, regular rhythm, normal first and second heart sounds, no murmurs, rubs or gallops Abdomen: no tenderness or distention, no masses by palpation, no abnormal pulsatility or arterial bruits, normal bowel sounds, no hepatosplenomegaly Extremities: no clubbing, cyanosis or edema; 2+ radial, ulnar and brachial pulses bilaterally; 2+ right femoral, posterior tibial and dorsalis pedis pulses; 2+ left femoral, posterior tibial and dorsalis pedis pulses; no subclavian or femoral bruits Neurological: grossly nonfocal Psych: Normal mood and affect    Wt Readings from Last 3 Encounters:  08/10/20 212 lb 12.8 oz (96.5 kg)  04/25/20 207 lb 3.2 oz (94 kg)  11/18/19 208 lb (94.3 kg)      Studies/Labs Reviewed:   EKG:  EKG is ordered today.  It shows normal sinus rhythm, normal tracing except for borderline QTC 451 ms  Recent Labs: 10/22/2019: ALT 38; Hemoglobin  15.7; Platelets 160 04/11/2020: Magnesium 1.9 08/10/2020: BNP 232.5; BUN 10; Creatinine, Ser 0.67; Potassium 4.5; Sodium 142   Lipid Panel    Component Value Date/Time   CHOL 193 11/18/2017 0857   TRIG 212 (H) 11/18/2017 0857   HDL 47 11/18/2017 0857   CHOLHDL 4.1 11/18/2017 0857   CHOLHDL 4.2 06/27/2015 1336    VLDL 48 (H) 06/27/2015 1336   LDLCALC 104 (H) 11/18/2017 0857   Labs November 04, 2018 Total cholesterol 159, HDL 59, LDL 77 Normal liver function tests, creatinine 0.99, potassium 3.7   11/16/2019 Hemoglobin 14.9, creatinine 0.85, glucose 89, liver function tests are all normal except for alkaline phosphatase 132 Total cholesterol 136, triglycerides 97, HDL 62, LDL 56 TSH 6.99   Labs 03/23/2020 Total cholesterol 145, HDL 55, LDL 69, triglycerides 118 TSH 0.886   CATH 04/05/2017   LM lesion, 70 %stenosed.  Mid LAD lesion, 0 %stenosed at site of prior stent.  Ost Cx lesion, 60 %stenosed.  Prox RCA-1 lesion, 20 %stenosed.  Prox RCA-2 lesion, 30 %stenosed.  The left ventricular systolic function is normal.  LV end diastolic pressure is normal.  The left ventricular ejection fraction is 55-65% by visual estimate.   1. 70% eccentric distal left main stenosis. Angiographically similar to 2016. FFR 0.89 into the LAD and 0.86 into the LCx suggesting that this lesion is not flow limiting.  2. Otherwise nonobstructive CAD. Patent stent in LAD. 3. Normal LV function 4. Normal LVEDP  Plan: recommend continued medical therapy.   ASSESSMENT:    1. Chronic diastolic heart failure ([place])   2. Coronary artery disease involving native coronary artery of native heart without angina pectoris   3. Essential hypertension   4. PAD (peripheral artery disease) ([place])   5. Mixed hyperlipidemia   6. Moderate obesity      PLAN:  In order of problems listed above:  1. CHF: Clinical volume status assessment is difficult due to obesity.  Her BNP is much higher than previous baseline.  After she returned home, she discovered that she has not been taking metolazone at all.  We will send in a new prescription for metolazone 2.5 mg every other day, with a target "dry weight" of under 207 pounds.  She should call us if her weight goes under 205 pounds.  We will plan to repeat labs in about 10  days and bring her back to the office in mid December for reevaluation. 2. CAD:  She is known to have a moderate stenosis of the left coronary artery that has been assessed twice in 2016 and 2018, including with fractional flow reserve and found not to be significant.  It is possible that this has progressed, but she does not have angina pectoris.  She did not tolerate beta-blockers due to bradycardia.  She did not tolerate ranolazine due to constipation. 3. HTN: Mildly elevated today.  Blood pressure should always be checked on the right arm. 4. PAD: Asymptomatic, no claudication or subclavian steal syndrome symptoms..  Blood pressure is lower in the left arm. 5. Hyperlipidemia: On her most recent lipid profile, all her lipid parameters were well within target range.  Continue current regimen of statin and ezetimibe. 6. Obesity: Remains moderately obese and this has always made it a little harder to assess her volume status.   Medication Adjustments/Labs and Tests Ordered: Current medicines are reviewed at length with the patient today.  Concerns regarding medicines are outlined above.  Medication changes, Labs and Tests ordered today  are listed in the Patient Instructions below. Patient Instructions  Medication Instructions:  No changes *If you need a refill on your cardiac medications before your next appointment, please call your pharmacy*   Lab Work: Your provider would like for you to have the following labs today: BNP and BMET  If you have labs (blood work) drawn today and your tests are completely normal, you will receive your results only by: Marland Kitchen MyChart Message (if you have MyChart) OR . A paper copy in the mail If you have any lab test that is abnormal or we need to change your treatment, we will call you to review the results.   Testing/Procedures: None ordered   Follow-Up: At [facility], you and your health needs are our priority.  As part of our continuing mission to  provide you with exceptional heart care, we have created designated Provider Care Teams.  These Care Teams include your primary Cardiologist (physician) and Advanced Practice Providers (APPs -  Physician Assistants and Nurse Practitioners) who all work together to provide you with the care you need, when you need it.  We recommend signing up for the patient portal called "MyChart".  Sign up information is provided on this After Visit Summary.  MyChart is used to connect with patients for Virtual Visits (Telemedicine).  Patients are able to view lab/test results, encounter notes, upcoming appointments, etc.  Non-urgent messages can be sent to your provider as well.   To learn more about what you can do with MyChart, go to [email or web address].    Your next appointment:   6 month(s)  The format for your next appointment:   In Person  Provider:   You may see Dr. Sallyanne Kuster or one of the following Advanced Practice Providers on your designated Care Team:    Almyra Deforest, PA-C  Fabian Sharp, [place] or   Roby Lofts, PA-C        Signed, Sanda Klein, MD  08/12/2020 3:30 PM    [address], [place], [place]  [address] Phone: [phone]; Fax: [phone]

## 2020-08-10 NOTE — Patient Instructions (Signed)
Medication Instructions:  No changes *If you need a refill on your cardiac medications before your next appointment, please call your pharmacy*   Lab Work: Your provider would like for you to have the following labs today: BNP and BMET  If you have labs (blood work) drawn today and your tests are completely normal, you will receive your results only by: Marland Kitchen MyChart Message (if you have MyChart) OR . A paper copy in the mail If you have any lab test that is abnormal or we need to change your treatment, we will call you to review the results.   Testing/Procedures: None ordered   Follow-Up: At [facility], you and your health needs are our priority.  As part of our continuing mission to provide you with exceptional heart care, we have created designated Provider Care Teams.  These Care Teams include your primary Cardiologist (physician) and Advanced Practice Providers (APPs -  Physician Assistants and Nurse Practitioners) who all work together to provide you with the care you need, when you need it.  We recommend signing up for the patient portal called "MyChart".  Sign up information is provided on this After Visit Summary.  MyChart is used to connect with patients for Virtual Visits (Telemedicine).  Patients are able to view lab/test results, encounter notes, upcoming appointments, etc.  Non-urgent messages can be sent to your provider as well.   To learn more about what you can do with MyChart, go to [email or web address].    Your next appointment:   6 month(s)  The format for your next appointment:   In Person  Provider:   You may see Dr. Sallyanne Kuster or one of the following Advanced Practice Providers on your designated Care Team:    Almyra Deforest, PA-C  Fabian Sharp, PA-C or   Roby Lofts, [place]

## 2020-08-11 LAB — BASIC METABOLIC PANEL
BUN/Creatinine Ratio: 15 (ref 12–28)
BUN: 10 mg/dL (ref 8–27)
CO2: 28 mmol/L (ref 20–29)
Calcium: 9.3 mg/dL (ref 8.7–10.3)
Chloride: 102 mmol/L (ref 96–106)
Creatinine, Ser: 0.67 mg/dL (ref 0.57–1.00)
GFR calc Af Amer: 93 mL/min/{1.73_m2} (ref >59)
GFR calc non Af Amer: 80 mL/min/{1.73_m2} (ref >59)
Glucose: 102 mg/dL — ABNORMAL HIGH (ref 65–99)
Potassium: 4.5 mmol/L (ref 3.5–5.2)
Sodium: 142 mmol/L (ref 134–144)

## 2020-08-11 LAB — BRAIN NATRIURETIC PEPTIDE: BNP: 232.5 pg/mL — ABNORMAL HIGH (ref 0.0–100.0)

## 2020-08-12 MED ORDER — METOLAZONE 2.5 MG PO TABS: 2.5000 mg | ORAL_TABLET | ORAL | 3 refills | Status: DC

## 2020-08-15 ENCOUNTER — Other Ambulatory Visit: Payer: Self-pay | Admitting: *Deleted

## 2020-08-15 DIAGNOSIS — I5032 Chronic diastolic (congestive) heart failure: Secondary | ICD-10-CM

## 2020-08-15 DIAGNOSIS — I2510 Atherosclerotic heart disease of native coronary artery without angina pectoris: Secondary | ICD-10-CM

## 2020-08-22 DIAGNOSIS — I5032 Chronic diastolic (congestive) heart failure: Secondary | ICD-10-CM | POA: Diagnosis not present

## 2020-08-22 DIAGNOSIS — I2510 Atherosclerotic heart disease of native coronary artery without angina pectoris: Secondary | ICD-10-CM | POA: Diagnosis not present

## 2020-08-23 ENCOUNTER — Telehealth: Payer: Self-pay | Admitting: *Deleted

## 2020-08-23 ENCOUNTER — Telehealth: Payer: Self-pay

## 2020-08-23 DIAGNOSIS — I5032 Chronic diastolic (congestive) heart failure: Secondary | ICD-10-CM

## 2020-08-23 LAB — BASIC METABOLIC PANEL
BUN/Creatinine Ratio: 20 (ref 12–28)
BUN: 19 mg/dL (ref 8–27)
CO2: 32 mmol/L — ABNORMAL HIGH (ref 20–29)
Calcium: 9.3 mg/dL (ref 8.7–10.3)
Chloride: 95 mmol/L — ABNORMAL LOW (ref 96–106)
Creatinine, Ser: 0.96 mg/dL (ref 0.57–1.00)
GFR calc Af Amer: 62 mL/min/{1.73_m2} (ref >59)
GFR calc non Af Amer: 54 mL/min/{1.73_m2} — ABNORMAL LOW (ref >59)
Glucose: 102 mg/dL — ABNORMAL HIGH (ref 65–99)
Potassium: 2.9 mmol/L — CL (ref 3.5–5.2)
Sodium: 143 mmol/L (ref 134–144)

## 2020-08-23 LAB — BRAIN NATRIURETIC PEPTIDE: BNP: 79.3 pg/mL (ref 0.0–100.0)

## 2020-08-23 MED ORDER — METOLAZONE 2.5 MG PO TABS: ORAL_TABLET | ORAL | 3 refills | Status: DC

## 2020-08-23 MED ORDER — FUROSEMIDE 80 MG PO TABS: 80.0000 mg | ORAL_TABLET | ORAL | 3 refills | Status: DC | PRN

## 2020-08-23 NOTE — Telephone Encounter (Signed)
-----   Message from Sanda Klein, MD sent at 08/23/2020  8:26 AM EST ----- K very low. Please stop metolazone. Please ask about weight and dyspnea today. If her weight is under 205 lb, stop the furosemide completely as well. Reduce furosemide to 80 mg ONCE daily starting now. Increase K[place] Con 20 mEq to THREE times daily for nest 2 days. Repeat BMET and BNP on Thursday 12/09.  I suspect she will describe weakness and fatigue due to the low K.

## 2020-08-23 NOTE — Telephone Encounter (Signed)
Spoke with Patience from [place]. Patient has a critical K of 2.9. MD aware. Plan in place. See 12/7 telephone note.

## 2020-08-23 NOTE — Telephone Encounter (Signed)
Patient made aware of results and verbalized understanding.  Her current weight today was 201 lbs (yesterday it was 203). She stated that she feels better but has been tired. She has been advised to take 80 mg of Furosemide for a weight over 205 lbs and to stop the Metolazone unless otherwise directed. She will take the Potassium 20 mEq three times daily for the next two days.   She will come back on Thursday for repeat labs.

## 2020-08-25 ENCOUNTER — Other Ambulatory Visit: Payer: Self-pay | Admitting: *Deleted

## 2020-08-25 DIAGNOSIS — I2510 Atherosclerotic heart disease of native coronary artery without angina pectoris: Secondary | ICD-10-CM | POA: Diagnosis not present

## 2020-08-25 DIAGNOSIS — I10 Essential (primary) hypertension: Secondary | ICD-10-CM | POA: Diagnosis not present

## 2020-08-25 DIAGNOSIS — I5032 Chronic diastolic (congestive) heart failure: Secondary | ICD-10-CM

## 2020-08-26 LAB — BASIC METABOLIC PANEL
BUN/Creatinine Ratio: 29 — ABNORMAL HIGH (ref 12–28)
BUN: 24 mg/dL (ref 8–27)
CO2: 28 mmol/L (ref 20–29)
Calcium: 9.4 mg/dL (ref 8.7–10.3)
Chloride: 93 mmol/L — ABNORMAL LOW (ref 96–106)
Creatinine, Ser: 0.82 mg/dL (ref 0.57–1.00)
GFR calc Af Amer: 75 mL/min/{1.73_m2} (ref >59)
GFR calc non Af Amer: 65 mL/min/{1.73_m2} (ref >59)
Glucose: 103 mg/dL — ABNORMAL HIGH (ref 65–99)
Potassium: 3.1 mmol/L — ABNORMAL LOW (ref 3.5–5.2)
Sodium: 140 mmol/L (ref 134–144)

## 2020-08-26 LAB — CBC
Hematocrit: 44.6 % (ref 34.0–46.6)
Hemoglobin: 15.1 g/dL (ref 11.1–15.9)
MCH: 32.4 pg (ref 26.6–33.0)
MCHC: 33.9 g/dL (ref 31.5–35.7)
MCV: 96 fL (ref 79–97)
Platelets: 140 10*3/uL — ABNORMAL LOW (ref 150–450)
RBC: 4.66 x10E6/uL (ref 3.77–5.28)
RDW: 12.9 % (ref 11.7–15.4)
WBC: 6.1 10*3/uL (ref 3.4–10.8)

## 2020-08-26 LAB — BRAIN NATRIURETIC PEPTIDE: BNP: 95.9 pg/mL (ref 0.0–100.0)

## 2020-08-29 DIAGNOSIS — L900 Lichen sclerosus et atrophicus: Secondary | ICD-10-CM | POA: Diagnosis not present

## 2020-08-29 DIAGNOSIS — Z466 Encounter for fitting and adjustment of urinary device: Secondary | ICD-10-CM | POA: Diagnosis not present

## 2020-09-01 ENCOUNTER — Telehealth: Payer: Self-pay | Admitting: Cardiovascular Disease

## 2020-09-01 NOTE — Telephone Encounter (Signed)
Pt c/o swelling: STAT is pt has developed SOB within 24 hours  1) How much weight have you gained and in what time span? 8 pounds in 2 days  2) If swelling, where is the swelling located? Mostly in legs and feet  3) Are you currently taking a fluid pill? yes  4) Are you currently SOB? Only after she gets done eating. States she starts coughing and wheezing.  5) Do you have a log of your daily weights (if so, list)? Tuesday 12/14 she was 201 and today 12/16 she is 209  6) Have you gained 3 pounds in a day or 5 pounds in a week? 8 pounds in 2 days   7) Have you traveled recently? no

## 2020-09-01 NOTE — Telephone Encounter (Signed)
Returned call to patient of Dr. Sallyanne Kuster - c/o weight gain, swelling She was seen 11/24 and called in 12/7 w/similar complaints She reports weight gain of 8lbs in 2 days, swelling in legs/feet, shortness of breath/coughing after eating - maybe like reflux but uses cough drops for relief She took lasix 80mg  this morning + potassium today and yesterday (took extra dose of K+ last night)  Per notes, target dry weight under 207lbs per last MD note She is not taking metolazone She has a suprapubic catheter and reports good UOP - emptied bag 5-6 times this morning  Advised will send message to Dr. Sallyanne Kuster to review

## 2020-09-02 NOTE — Telephone Encounter (Signed)
OK to take metolazone ONCE, before next furosemide dose. Take KCl 20 mEq THREE times daily instead of usual once daily for the next 3 days, then return to usual Rx, please. Has appt with [facility] 09/19/20.

## 2020-09-02 NOTE — Telephone Encounter (Signed)
Thanks, agree w recommendations.

## 2020-09-02 NOTE — Telephone Encounter (Signed)
Returned call to patient of Dr. Sallyanne Kuster Weight today is 205.5 lbs - she has had good UOP but still has notable swelling in bilateral LE She took lasix 80mg  today at 6am She is due for next lasix dose tomorrow  Advised to weigh as usual and take metolazone if needed, based on weight/swelling take metolazone + potassium as advised Advised she call back early next week with update

## 2020-09-07 DIAGNOSIS — R339 Retention of urine, unspecified: Secondary | ICD-10-CM | POA: Diagnosis not present

## 2020-09-16 ENCOUNTER — Other Ambulatory Visit: Payer: Self-pay

## 2020-09-16 ENCOUNTER — Emergency Department (HOSPITAL_COMMUNITY)
Admission: EM | Admit: 2020-09-16 | Discharge: 2020-09-16 | Disposition: A | Discharge status: Home / Self Care | Payer: HMO | Attending: Emergency Medicine | Admitting: Emergency Medicine

## 2020-09-16 DIAGNOSIS — I2510 Atherosclerotic heart disease of native coronary artery without angina pectoris: Secondary | ICD-10-CM | POA: Diagnosis not present

## 2020-09-16 DIAGNOSIS — T83091A Other mechanical complication of indwelling urethral catheter, initial encounter: Secondary | ICD-10-CM | POA: Insufficient documentation

## 2020-09-16 DIAGNOSIS — J45909 Unspecified asthma, uncomplicated: Secondary | ICD-10-CM | POA: Diagnosis not present

## 2020-09-16 DIAGNOSIS — Z91040 Latex allergy status: Secondary | ICD-10-CM | POA: Diagnosis not present

## 2020-09-16 DIAGNOSIS — E039 Hypothyroidism, unspecified: Secondary | ICD-10-CM | POA: Diagnosis not present

## 2020-09-16 DIAGNOSIS — Y733 Surgical instruments, materials and gastroenterology and urology devices (including sutures) associated with adverse incidents: Secondary | ICD-10-CM | POA: Diagnosis not present

## 2020-09-16 DIAGNOSIS — T839XXA Unspecified complication of genitourinary prosthetic device, implant and graft, initial encounter: Secondary | ICD-10-CM

## 2020-09-16 DIAGNOSIS — I5032 Chronic diastolic (congestive) heart failure: Secondary | ICD-10-CM | POA: Diagnosis not present

## 2020-09-16 DIAGNOSIS — I130 Hypertensive heart and chronic kidney disease with heart failure and stage 1 through stage 4 chronic kidney disease, or unspecified chronic kidney disease: Secondary | ICD-10-CM | POA: Insufficient documentation

## 2020-09-16 DIAGNOSIS — Z7982 Long term (current) use of aspirin: Secondary | ICD-10-CM | POA: Diagnosis not present

## 2020-09-16 DIAGNOSIS — Z79899 Other long term (current) drug therapy: Secondary | ICD-10-CM | POA: Diagnosis not present

## 2020-09-16 DIAGNOSIS — T83098A Other mechanical complication of other indwelling urethral catheter, initial encounter: Secondary | ICD-10-CM | POA: Diagnosis not present

## 2020-09-16 DIAGNOSIS — Z85828 Personal history of other malignant neoplasm of skin: Secondary | ICD-10-CM | POA: Insufficient documentation

## 2020-09-16 DIAGNOSIS — N183 Chronic kidney disease, stage 3 unspecified: Secondary | ICD-10-CM | POA: Diagnosis not present

## 2020-09-16 DIAGNOSIS — R109 Unspecified abdominal pain: Secondary | ICD-10-CM | POA: Insufficient documentation

## 2020-09-16 LAB — CBC WITH DIFFERENTIAL/PLATELET
Abs Immature Granulocytes: 0.01 10*3/uL (ref 0.00–0.07)
Basophils Absolute: 0 10*3/uL (ref 0.0–0.1)
Basophils Relative: 1 %
Eosinophils Absolute: 0.1 10*3/uL (ref 0.0–0.5)
Eosinophils Relative: 2 %
HCT: 48 % — ABNORMAL HIGH (ref 36.0–46.0)
Hemoglobin: 15.5 g/dL — ABNORMAL HIGH (ref 12.0–15.0)
Immature Granulocytes: 0 %
Lymphocytes Relative: 38 %
Lymphs Abs: 1.5 10*3/uL (ref 0.7–4.0)
MCH: 32.3 pg (ref 26.0–34.0)
MCHC: 32.3 g/dL (ref 30.0–36.0)
MCV: 100 fL (ref 80.0–100.0)
Monocytes Absolute: 0.4 10*3/uL (ref 0.1–1.0)
Monocytes Relative: 11 %
Neutro Abs: 1.9 10*3/uL (ref 1.7–7.7)
Neutrophils Relative %: 48 %
Platelets: 91 10*3/uL — ABNORMAL LOW (ref 150–400)
RBC: 4.8 MIL/uL (ref 3.87–5.11)
RDW: 14.2 % (ref 11.5–15.5)
WBC: 3.9 10*3/uL — ABNORMAL LOW (ref 4.0–10.5)
nRBC: 0 % (ref 0.0–0.2)

## 2020-09-16 LAB — BASIC METABOLIC PANEL
Anion gap: 10 (ref 5–15)
BUN: 15 mg/dL (ref 8–23)
CO2: 29 mmol/L (ref 22–32)
Calcium: 8.8 mg/dL — ABNORMAL LOW (ref 8.9–10.3)
Chloride: 99 mmol/L (ref 98–111)
Creatinine, Ser: 0.91 mg/dL (ref 0.44–1.00)
GFR, Estimated: >60 mL/min (ref >60)
Glucose, Bld: 98 mg/dL (ref 70–99)
Potassium: 4.2 mmol/L (ref 3.5–5.1)
Sodium: 138 mmol/L (ref 135–145)

## 2020-09-16 NOTE — Discharge Instructions (Addendum)
Follow-up with your urologist next week for a suprapubic catheter exchange.  If you have difficulty, with the catheter before then, go to the [facility] emergency department.  Also, remember to take your spare suprapubic catheter if you have to go to the emergency department again.

## 2020-09-16 NOTE — ED Notes (Signed)
Pt states she has been hydrating as normal, but has not had output into her indwelling catheter in 48+ hours. She has substantial pressure in her pelvic area.

## 2020-09-16 NOTE — ED Triage Notes (Signed)
Pt reports having urinary catheter x 4 months that is changed every four weeks. Is due to have it changed next week but for two days it has not been draining properly. Pt still has urge to urinate and then urinates around the catheter, and catheter catches/drains no urine. Endorses severe burning with urination.

## 2020-09-16 NOTE — ED Provider Notes (Signed)
[facility] EMERGENCY DEPARTMENT Provider Note   CSN: [id number] Arrival date & time: 09/16/20  [id number]     History No chief complaint on file.   Jill Preston is a 86 y.o. female.  HPI She presents for nonfunctional urinary catheter, which is a suprapubic.  Today she had a sudden urge to void and leaked a little bit of urine from her urethra, but typically does not get anything out of the urethra.  When she urinates today she had severe pain.  She denies fever, chills, nausea, vomiting, weakness or dizziness.  She is due to have her urinary catheter changed next week.  There are no other known modifying factors.    Past Medical History:  Diagnosis Date  . Arthritis    right knee  . Cancer [facility]) yrs ago   skin cancer removed from face  . Chronic diastolic CHF (congestive heart failure) ([place])   . Chronic venous insufficiency    LEA VENOUS, 10/17/2011 - mild reflux in bilateral common femoral veins  . CKD (chronic kidney disease), stage III ([place])   . Coronary artery disease    a. s/p PCI/BMS to prox LAD and balloon angioplasty to mLAD with suboptimal result in 2000. b. Abnl nuc 2012, cath 09/2011 - showed that the overall territory of potential ischemia was small and attributable to a moderately diseased small second diagonal artery. Med rx.  Marland Kitchen Dyspnea    with activity  . Headache   . History of blood transfusion 2017  . Hyperlipidemia   . Hypertension   . Hypertensive heart disease   . Hypothyroidism   . Obesity   . Pneumonia    several times  . Stroke [facility])    pt. states she had "light stroke" in sept. 1980  . Urinary incontinence     Patient Active Problem List   Diagnosis Date Noted  . Peroneal tendon tear, right, sequela   . PAD (peripheral artery disease) ([place]) 08/01/2018  . Herniated intervertebral disc of lumbar spine 06/02/2018  . Tear of left rotator cuff 10/29/2017  . Bilateral leg pain 01/27/2017  . Cervical spine degeneration  10/23/2015  . Hypersomnolence 10/23/2015  . S/P IVC filter 02/15/2015  . Angina pectoris ([place]) 01/26/2015  . CAD in native artery   . Ischemic chest pain ([place])   . Edema of left lower extremity 09/16/2014  . Acute thromboembolism of deep veins of lower extremity ([place]) 09/14/2014  . Acute deep vein thrombosis of left lower extremity ([place]) 09/14/2014  . Anemia associated with acute blood loss 09/14/2014  . CHF (congestive heart failure) ([place]) 09/05/2014  . Subclavian artery stenosis, left ([place]) 08/04/2014  . Chronic diastolic CHF (congestive heart failure) ([place]) 07/16/2014  . Essential hypertension 07/08/2014  . CKD (chronic kidney disease), stage III ([place])   . Hypertensive heart disease   . Obesity, Class II, BMI 35-39.9, with comorbidity   . Chronic venous insufficiency   . Chronic diastolic heart failure ([place]) 07/04/2014  . Metatarsal deformity 06/24/2013  . CAD - LAD stent 12/00. Patent '02 and 1/13, moderate D2 managed medically 02/26/2013  . Dyslipidemia 02/26/2013  . DJD (degenerative joint disease)- back 02/26/2013  . Vaginal candidiasis 10/23/2012  . Asthma 08/06/2012  . Hyperlipidemia 08/05/2012  . Lichen sclerosus [id number]  . Rectocele 06/03/2012  . Recurrent UTI 06/03/2012  . Urge urinary incontinence 06/03/2012  . Voiding dysfunction 06/03/2012  . Hypothyroidism 07/23/2011  . Accelerated hypertension 07/23/2011    Past Surgical History:  Procedure Laterality  Date  . ABDOMINAL HYSTERECTOMY  1970's   complete  . ANKLE ARTHROSCOPY Right 07/10/2017   Procedure: ANKLE ARTHROSCOPY;  Surgeon: [name], DPM;  Location: [place];  Service: Podiatry;  Laterality: Right;  . BACK SURGERY  07/26/2016   cervical neck surgery, [facility]  . BLADDER SURGERY  2010   WITH MESH  bladder tach  . bunion removal surgery Bilateral 15 yrs ago  . CARDIAC CATHETERIZATION Left 09/25/2011   Medical management  . CARDIAC CATHETERIZATION Left 03/25/2001   Normal LV  function, LAD residual narrowing of less than 10%, normal ramus intermediate, circumflex, and RCA,   . CARDIAC CATHETERIZATION  09/04/1999   LAD, 3x68mm Tetra stent resulting in a reduction of the 80% stenosis to 0% residual  . CARDIAC CATHETERIZATION N/A 01/26/2015   Procedure: Right/Left Heart Cath and Coronary Angiography;  Surgeon: Troy Sine, MD;  Location: [place] CV LAB;  Service: Cardiovascular;  Laterality: N/A;  . CARDIAC CATHETERIZATION N/A 01/27/2015   Procedure: Intravascular Pressure Wire/FFR Study;  Surgeon: Burnell Blanks, MD;  Location: [place] CV LAB;  Service: Cardiovascular;  Laterality: N/A;  . CARDIAC CATHETERIZATION N/A 01/27/2015   Procedure: Right Heart Cath;  Surgeon: Burnell Blanks, MD;  Location: [place] CV LAB;  Service: Cardiovascular;  Laterality: N/A;  . CHOLECYSTECTOMY    . COLONOSCOPY WITH PROPOFOL N/A 03/07/2017   Procedure: COLONOSCOPY WITH PROPOFOL;  Surgeon: Juanita Craver, MD;  Location: [facility];  Service: Endoscopy;  Laterality: N/A;  . CORONARY STENT PLACEMENT     LAD x 1  . ganglion cyst removal  yrs ago   x 2  . ILIAC VEIN ANGIOPLASTY / STENTING  02/15/2015  . INTRAVASCULAR PRESSURE WIRE/FFR STUDY N/A 04/05/2017   Procedure: Intravascular Pressure Wire/FFR Study;  Surgeon: Martinique, Peter M, MD;  Location: [place] CV LAB;  Service: Cardiovascular;  Laterality: N/A;  . IVC FILTER INSERTION  2016  . IVC FILTER REMOVAL  02/15/2015   at [facility]  . LEFT HEART CATH AND CORONARY ANGIOGRAPHY N/A 04/05/2017   Procedure: Left Heart Cath and Coronary Angiography;  Surgeon: Martinique, Peter M, MD;  Location: [place] CV LAB;  Service: Cardiovascular;  Laterality: N/A;  . LEFT HEART CATHETERIZATION WITH CORONARY ANGIOGRAM N/A 09/25/2011   Procedure: LEFT HEART CATHETERIZATION WITH CORONARY ANGIOGRAM;  Surgeon: Sanda Klein, MD;  Location: [place] CATH LAB;  Service: Cardiovascular;  Laterality: N/A;  . multiple bladder surgeries to remove  mesh    . ROTATOR CUFF REPAIR Right   . stent to groin Left 08/2014   left leg  . TENDON REPAIR Right 07/10/2017   Procedure: RIGHT PERONEAL TENDON REPAIR;  Surgeon: [name], DPM;  Location: [place];  Service: Podiatry;  Laterality: Right;  . TENDON REPAIR Right 05/27/2019   Procedure: PERONEAL TENDON REPAIR x2;  Surgeon: [name], DPM;  Location: [facility];  Service: Podiatry;  Laterality: Right;     OB History   No obstetric history on file.     Family History  Problem Relation Age of Onset  . Cancer Mother   . Heart disease Father   . Heart disease Sister   . Heart disease Brother     Social History   Tobacco Use  . Smoking status: Never Smoker  . Smokeless tobacco: Never Used  Vaping Use  . Vaping Use: Never used  Substance Use Topics  . Alcohol use: No  . Drug use: No    Home Medications Prior  to Admission medications   Medication Sig Start Date End Date Taking? Authorizing Provider  acetaminophen (TYLENOL) 500 MG tablet Take 500-1,000 mg by mouth 2 (two) times daily as needed for headache (pain).   Yes [provider]  amLODipine (NORVASC) 2.5 MG tablet TAKE 1 TABLET(2.5 MG) BY MOUTH DAILY Patient taking differently: Take 2.5 mg by mouth daily. 05/06/20  Yes Croitoru, Mihai, MD  Ascorbic Acid (VITAMIN C) 1000 MG tablet Take 2,000 mg by mouth daily.   Yes [provider]  aspirin EC 81 MG tablet Take 1 tablet (81 mg total) by mouth daily. 02/26/13  Yes Kilroy, Luke K, PA-C  bisacodyl (DULCOLAX) 5 MG EC tablet Take 10 mg by mouth daily.    Yes [provider]  cholecalciferol (VITAMIN D3) 25 MCG (1000 UNIT) tablet Take 1,000 Units by mouth daily.    Yes [provider]  clobetasol ointment (TEMOVATE) [id number] % Apply 1 application topically 2 (two) times daily. For vaginal itching 10/08/18  Yes [provider]  Collagen Hydrolysate POWD Take 1 Scoop by mouth daily. Mix in coffee and drink   Yes [provider]   ezetimibe (ZETIA) 10 MG tablet Take 1 tablet (10 mg total) by mouth daily. 01/21/20  Yes Croitoru, Mihai, MD  furosemide (LASIX) 80 MG tablet Take 1 tablet (80 mg total) by mouth as needed for edema (for a weight over 205 pounds.). Patient taking differently: Take 80 mg by mouth See admin instructions. Take one tablet (80 mg) by mouth every morning, may also take one tablet (80 mg) in the afternoon as needed for weight gain of 4-5 lbs 08/23/20  Yes Croitoru, Mihai, MD  isosorbide mononitrate (IMDUR) 30 MG 24 hr tablet Take 2 tablets (60 mg total) by mouth 2 (two) times daily. Patient taking differently: Take 60 mg by mouth daily. 06/07/20  Yes Croitoru, Mihai, MD  KLOR-CON M20 20 MEQ tablet Take 1 tablet (20 mEq total) by mouth daily. Patient taking differently: Take 20 mEq by mouth See admin instructions. Take one tablet (20 meq) by mouth every morning and with each afternoon dose of lasix 05/05/20  Yes Croitoru, Mihai, MD  levothyroxine (SYNTHROID, LEVOTHROID) 112 MCG tablet Take 112 mcg by mouth daily before breakfast.  05/15/18  Yes [provider]  metolazone (ZAROXOLYN) 2.5 MG tablet Take as needed when instructed by provider only. Patient taking differently: Take 2.5 mg by mouth daily. 08/23/20  Yes Croitoru, Mihai, MD  nitroGLYCERIN (NITROSTAT) 0.4 MG SL tablet Place 1 tablet (0.4 mg total) under the tongue every 5 (five) minutes as needed for chest pain. 11/15/16  Yes Croitoru, Mihai, MD  nystatin (MYCOSTATIN/NYSTOP) powder Apply 1 application topically every other day. 07/28/18  Yes [provider]  OVER THE COUNTER MEDICATION Take 2 tablets by mouth daily. "Focus factor"   Yes [provider]  OVER THE COUNTER MEDICATION Take 1 Dose by mouth 3 (three) times a week. Liver detox  (tbsp lemon juice, tbsp honey, nutmeg, cinnamon, ginger, turmeric)   Yes [provider]  OVER THE COUNTER MEDICATION Take 1 Scoop by mouth daily. Beet root powder - mix in coffee and  drink   Yes [provider]  pravastatin (PRAVACHOL) 80 MG tablet Take 80 mg by mouth at bedtime.   Yes [provider]  traMADol (ULTRAM) 50 MG tablet Take 50 mg by mouth daily as needed for severe pain.   Yes [provider]    Allergies    Atorvastatin, Penicillins, Shellfish  allergy, Aminophylline, Iodinated diagnostic agents, Latex, Oxybutynin chloride, Vancomycin, Adhesive [tape], Betadine [povidone iodine], Ciprofloxacin, Codeine, and Ranolazine  Review of Systems   Review of Systems  All other systems reviewed and are negative.   Physical Exam Updated Vital Signs BP (!) 173/70   Pulse (!) 56   Temp 99 F (37.2 C) (Oral)   Resp 14   SpO2 95%   Physical Exam Vitals and nursing note reviewed.  Constitutional:      General: She is not in acute distress.    Appearance: She is well-developed and well-nourished. She is obese. She is not ill-appearing, toxic-appearing or diaphoretic.  HENT:     Head: Normocephalic and atraumatic.     Right Ear: External ear normal.     Left Ear: External ear normal.  Eyes:     Extraocular Movements: EOM normal.     Conjunctiva/sclera: Conjunctivae normal.     Pupils: Pupils are equal, round, and reactive to light.  Neck:     Trachea: Phonation normal.  Cardiovascular:     Rate and Rhythm: Normal rate and regular rhythm.     Heart sounds: Normal heart sounds.  Pulmonary:     Effort: Pulmonary effort is normal.     Breath sounds: Normal breath sounds.  Chest:     Chest wall: No bony tenderness.  Abdominal:     Palpations: Abdomen is soft.     Tenderness: There is abdominal tenderness (Suprapubic tenderness, mild).     Comments: No appreciable suprapubic mass.  Suprapubic site and appliance appear normal except the catheter bag does not have any urine in it.  Musculoskeletal:        General: Normal range of motion.     Cervical back: Normal range of motion and neck supple.  Skin:    General: Skin is warm,  dry and intact.  Neurological:     Mental Status: She is alert and oriented to person, place, and time.     Cranial Nerves: No cranial nerve deficit.     Sensory: No sensory deficit.     Motor: No abnormal muscle tone.     Coordination: Coordination normal.  Psychiatric:        Mood and Affect: Mood and affect and mood normal.        Behavior: Behavior normal.        Thought Content: Thought content normal.        Judgment: Judgment normal.     ED Results / Procedures / Treatments   Labs (all labs ordered are listed, but only abnormal results are displayed) Labs Reviewed  BASIC METABOLIC PANEL - Abnormal; Notable for the following components:      Result Value   Calcium 8.8 (*)    All other components within normal limits  CBC WITH DIFFERENTIAL/PLATELET - Abnormal; Notable for the following components:   WBC 3.9 (*)    Hemoglobin 15.5 (*)    HCT 48.0 (*)    Platelets 91 (*)    All other components within normal limits    EKG None  Radiology No results found.  Procedures Procedures (including critical care time)  Medications Ordered in ED Medications - No data to display  ED Course  I have reviewed the triage vital signs and the nursing notes.  Pertinent labs & imaging results that were available during my care of the patient were reviewed by me and considered in my medical decision making (see chart for details).  Clinical Course as of 09/16/20  [id number]  Fri Sep 16, 2020  1915 Suprapubic catheter irrigated by nursing, with result of 200 cc yellow urine recovered into the bag.  Patient states she was more comfortable after the irrigation.  She was able ambulate without difficulty. [EW]    Clinical Course User Index [EW] Mancel Bale, MD   MDM Rules/Calculators/A&P                           Patient Vitals for the past 24 hrs:  BP Temp Temp src Pulse Resp SpO2  09/16/20 1845 (!) 173/70 -- -- (!) 56 14 95 %  09/16/20 1800 (!) 180/95 -- -- (!) 57 16 95 %   09/16/20 1745 (!) 179/63 -- -- (!) 56 14 96 %  09/16/20 1730 (!) 175/73 -- -- (!) 55 -- 96 %  09/16/20 1700 (!) 181/80 -- -- 60 18 98 %  09/16/20 1659 -- 99 F (37.2 C) Oral -- -- --  09/16/20 1657 -- -- -- 64 -- 98 %  09/16/20 1656 (!) 161/126 -- -- -- -- --  09/16/20 1432 (!) 152/61 -- -- 60 18 97 %  09/16/20 1204 (!) 169/75 98.9 F (37.2 C) -- 65 14 94 %  09/16/20 0938 (!) 150/68 98.6 F (37 C) Oral 66 16 100 %    7:17 PM Reevaluation with update and discussion. After initial assessment and treatment, an updated evaluation reveals she is comfortable, and has 200 cc of yellow urine in her drainage bag.  Findings discussed and questions answered. Mancel Bale   Medical Decision Making:  This patient is presenting for evaluation of Nondraining suprapubic catheter, which does require a range of treatment options, and is a complaint that involves a moderate risk of morbidity and mortality. The differential diagnoses include UTI, bladder obstruction, catheter malfunction. I decided to review old records, and in summary elderly female presenting with absence of urinary output from suprapubic catheter for about 12 hours..  I did not require additional historical information from anyone.  Clinical Laboratory Tests Ordered, included CBC and Metabolic panel. Review indicates essentially normal findings.   Critical Interventions-clinical evaluation, irrigation of suprapubic catheter.  Discussion with patient  After These Interventions, the Patient was reevaluated and was found stable for discharge.  Catheter urine drainage, improved after irrigation.  Screening labs indicate no infection or bladder outlet obstruction.  CRITICAL CARE-no Performed by: Mancel Bale  Nursing Notes Reviewed/ Care Coordinated Applicable Imaging Reviewed Interpretation of Laboratory Data incorporated into ED treatment  The patient appears reasonably screened and/or stabilized for discharge and I doubt any  other medical condition or other [facility] requiring further screening, evaluation, or treatment in the ED at this time prior to discharge.  Plan: Home Medications-continue usual; Home Treatments-usual catheter care; return here if the recommended treatment, does not improve the symptoms; Recommended follow up-urology follow-up next week as scheduled for catheter replacement     Final Clinical Impression(s) / ED Diagnoses Final diagnoses:  Urinary catheter complication, initial encounter [facility])    Rx / DC Orders ED Discharge Orders    None       Mancel Bale, MD 09/16/20 1941

## 2020-09-16 NOTE — ED Notes (Signed)
Catheter French 18 ordered @ 1825-per [place], Charity fundraiser ordered by [facility]

## 2020-09-19 ENCOUNTER — Ambulatory Visit: Payer: HMO | Admitting: Cardiology

## 2020-09-21 DIAGNOSIS — I2510 Atherosclerotic heart disease of native coronary artery without angina pectoris: Secondary | ICD-10-CM | POA: Diagnosis not present

## 2020-09-21 DIAGNOSIS — I5032 Chronic diastolic (congestive) heart failure: Secondary | ICD-10-CM | POA: Diagnosis not present

## 2020-09-21 DIAGNOSIS — I739 Peripheral vascular disease, unspecified: Secondary | ICD-10-CM | POA: Diagnosis not present

## 2020-09-21 DIAGNOSIS — E039 Hypothyroidism, unspecified: Secondary | ICD-10-CM | POA: Diagnosis not present

## 2020-09-21 DIAGNOSIS — E782 Mixed hyperlipidemia: Secondary | ICD-10-CM | POA: Diagnosis not present

## 2020-09-22 DIAGNOSIS — Z466 Encounter for fitting and adjustment of urinary device: Secondary | ICD-10-CM | POA: Diagnosis not present

## 2020-10-05 DIAGNOSIS — N939 Abnormal uterine and vaginal bleeding, unspecified: Secondary | ICD-10-CM | POA: Diagnosis not present

## 2020-10-05 DIAGNOSIS — N904 Leukoplakia of vulva: Secondary | ICD-10-CM | POA: Diagnosis not present

## 2020-10-05 DIAGNOSIS — N952 Postmenopausal atrophic vaginitis: Secondary | ICD-10-CM | POA: Diagnosis not present

## 2020-10-06 ENCOUNTER — Ambulatory Visit (INDEPENDENT_AMBULATORY_CARE_PROVIDER_SITE_OTHER): Payer: HMO | Admitting: Cardiovascular Disease

## 2020-10-06 ENCOUNTER — Other Ambulatory Visit: Payer: Self-pay

## 2020-10-06 VITALS — BP 134/62 | HR 80 | Ht 65.5 in | Wt 200.0 lb

## 2020-10-06 DIAGNOSIS — I5032 Chronic diastolic (congestive) heart failure: Secondary | ICD-10-CM

## 2020-10-06 DIAGNOSIS — I2510 Atherosclerotic heart disease of native coronary artery without angina pectoris: Secondary | ICD-10-CM

## 2020-10-06 DIAGNOSIS — I739 Peripheral vascular disease, unspecified: Secondary | ICD-10-CM | POA: Diagnosis not present

## 2020-10-06 DIAGNOSIS — E7800 Pure hypercholesterolemia, unspecified: Secondary | ICD-10-CM

## 2020-10-06 DIAGNOSIS — I10 Essential (primary) hypertension: Secondary | ICD-10-CM | POA: Diagnosis not present

## 2020-10-06 DIAGNOSIS — E668 Other obesity: Secondary | ICD-10-CM

## 2020-10-06 NOTE — Patient Instructions (Signed)
Medication Instructions:  °No changes °*If you need a refill on your cardiac medications before your next appointment, please call your pharmacy* ° ° °Lab Work: °None ordered °If you have labs (blood work) drawn today and your tests are completely normal, you will receive your results only by: °• MyChart Message (if you have MyChart) OR °• A paper copy in the mail °If you have any lab test that is abnormal or we need to change your treatment, we will call you to review the results. ° ° °Testing/Procedures: °None ordered ° ° °Follow-Up: °At [facility], you and your health needs are our priority.  As part of our continuing mission to provide you with exceptional heart care, we have created designated Provider Care Teams.  These Care Teams include your primary Cardiologist (physician) and Advanced Practice Providers (APPs -  Physician Assistants and Nurse Practitioners) who all work together to provide you with the care you need, when you need it. ° °We recommend signing up for the patient portal called "MyChart".  Sign up information is provided on this After Visit Summary.  MyChart is used to connect with patients for Virtual Visits (Telemedicine).  Patients are able to view lab/test results, encounter notes, upcoming appointments, etc.  Non-urgent messages can be sent to your provider as well.   °To learn more about what you can do with MyChart, go to [email or web address].   ° °Your next appointment:   °3 month(s) ° °The format for your next appointment:   °In Person ° °Provider:   °You may see Mihai Croitoru, MD or one of the following Advanced Practice Providers on your designated Care Team:   °· Hao Meng, PA-C °· Angela Duke, PA-C or  °· Krista Kroeger, PA-C ° ° ° °

## 2020-10-06 NOTE — Progress Notes (Signed)
Patient ID: Jill Preston, female   DOB: May 26, 1933, 87 y.o.   MRN: [id number] Patient ID: Jill Preston, female   DOB: 03/19/1933, 87 y.o.   MRN: [id number]    Cardiology Office Note    Date:  10/08/2020   ID:  Jill Preston, DOB 12/23/1932, MRN [id number]  PCP:  Merrilee Seashore, MD  Cardiologist:   Sanda Klein, MD   chief complaint: Fatigue, dyspnea.  History of Present Illness:  Jill Preston is a 87 y.o. female returning in follow-up for CAD and CHF.  She seems to be doing a little better from a physical point of view and no longer appears to have the same degree of respiratory difficulty.  She has responded well to diuretics and at home her scale occasionally shows as low as 197 pounds.  She seems to do poorly whenever she exceeds 205 pounds.  She does not have orthopnea or PND, but she always sleeps with the head of the bed elevated to about 30 degrees.  Ankle edema has improved.  She continues to have a suprapubic catheter in place.  Her biggest problem continues to be husband Robert's progressive dementia.  He is eating less and drinks very little.  He has required an emergency room visit for administration of intravenous fluids and received intravenous fluids again in their primary care provider's office, trying to avoid another ER visit.  Increasing her metolazone to twice a week did not work this time.  Her weight is still up above our target of around 207 pounds on our office scale and she is still short of breath.  Denies dizziness, syncope or palpitations.  She does not have frank orthopnea or PND, but as mentioned, she chronically sleeps with the head of her adjustable bed elevated at a significant angle.  Most recent labs show a creatinine of 0.9 and a normal potassium level on 09/16/2020.  Most recent LDL cholesterol is less than 70 (47 on 09/21/2020).  Normal lower extremity venous Dopplers in January 2021.  She has a long history of coronary disease.  She underwent proximal LAD bare-metal stenting and balloon angioplasty of the mid LAD in 2013. She has had persistent ischemia in the territory of the diagonal artery on nuclear stress test performed over the last few years. Coronary angiography performed May 2016 showed a widely patent LAD stent, 60% ostial circumflex stenosis, 20-30% right coronary artery stenosis. There was concern about possible left coronary artery stenosis but fractional flow reserve was normal (0.96 at baseline, 0.91 during intravenous adenosine infusion). She had good anginal response to Ranexa but developed severe constipation and could not tolerate the medication. She has been intolerant to beta blockers due to bradycardia. She has preserved left ventricular systolic function but has had episodes of acute exacerbation of diastolic heart failure attributed to hypertensive heart disease. December 2015, she was critically ill with an acute left iliofemoral DVT with anticoagulation complicated by retroperitoneal hematoma and hemorrhagic shock, requiring placement of an inferior vena cava filter. The filter was removed in June 2016. Coronary angiography in July 2018 showed unchanged anatomy of the eccentric stenosis in the left main coronary artery with noncritical FFR (0.89 in the LAD, 0.86 in the LCx).  Intravascular Pressure Wire/FFR Study  Left Heart Cath and Coronary Angiography  Conclusion     LM lesion, 70 %stenosed.  Mid LAD lesion, 0 %stenosed at site of prior stent.  Ost Cx lesion, 60 %stenosed.  Prox RCA-1 lesion, 20 %stenosed.  Prox RCA-2  lesion, 30 %stenosed.  The left ventricular systolic function is normal.  LV end diastolic pressure is normal.  The left ventricular ejection fraction is 55-65% by visual estimate.   1. 70% eccentric distal left main stenosis. Angiographically similar to 2016. FFR 0.89 into the LAD and 0.86 into the LCx suggesting that this lesion is not flow limiting.  2. Otherwise  nonobstructive CAD. Patent stent in LAD. 3. Normal LV function 4. Normal LVEDP  Plan: recommend continued medical therapy.     Past Medical History:  Diagnosis Date  . Arthritis    right knee  . Cancer [facility]) yrs ago   skin cancer removed from face  . Chronic diastolic CHF (congestive heart failure) (HCC)   . Chronic venous insufficiency    LEA VENOUS, 10/17/2011 - mild reflux in bilateral common femoral veins  . CKD (chronic kidney disease), stage III (HCC)   . Coronary artery disease    a. s/p PCI/BMS to prox LAD and balloon angioplasty to mLAD with suboptimal result in 2000. b. Abnl nuc 2012, cath 09/2011 - showed that the overall territory of potential ischemia was small and attributable to a moderately diseased small second diagonal artery. Med rx.  Marland Kitchen Dyspnea    with activity  . Headache   . History of blood transfusion 2017  . Hyperlipidemia   . Hypertension   . Hypertensive heart disease   . Hypothyroidism   . Obesity   . Pneumonia    several times  . Stroke [facility])    pt. states she had "light stroke" in sept. 1980  . Urinary incontinence     Past Surgical History:  Procedure Laterality Date  . ABDOMINAL HYSTERECTOMY  1970's   complete  . ANKLE ARTHROSCOPY Right 07/10/2017   Procedure: ANKLE ARTHROSCOPY;  Surgeon: Park Liter, DPM;  Location: [facility] OR;  Service: Podiatry;  Laterality: Right;  . BACK SURGERY  07/26/2016   cervical neck surgery, [facility]  . BLADDER SURGERY  2010   WITH MESH  bladder tach  . bunion removal surgery Bilateral 15 yrs ago  . CARDIAC CATHETERIZATION Left 09/25/2011   Medical management  . CARDIAC CATHETERIZATION Left 03/25/2001   Normal LV function, LAD residual narrowing of less than 10%, normal ramus intermediate, circumflex, and RCA,   . CARDIAC CATHETERIZATION  09/04/1999   LAD, 3x35mm Tetra stent resulting in a reduction of the 80% stenosis to 0% residual  . CARDIAC CATHETERIZATION N/A 01/26/2015   Procedure:  Right/Left Heart Cath and Coronary Angiography;  Surgeon: Lennette Bihari, MD;  Location: [facility] INVASIVE CV LAB;  Service: Cardiovascular;  Laterality: N/A;  . CARDIAC CATHETERIZATION N/A 01/27/2015   Procedure: Intravascular Pressure Wire/FFR Study;  Surgeon: Kathleene Hazel, MD;  Location: [facility] INVASIVE CV LAB;  Service: Cardiovascular;  Laterality: N/A;  . CARDIAC CATHETERIZATION N/A 01/27/2015   Procedure: Right Heart Cath;  Surgeon: Kathleene Hazel, MD;  Location: [facility] INVASIVE CV LAB;  Service: Cardiovascular;  Laterality: N/A;  . CHOLECYSTECTOMY    . COLONOSCOPY WITH PROPOFOL N/A 03/07/2017   Procedure: COLONOSCOPY WITH PROPOFOL;  Surgeon: Charna Elizabeth, MD;  Location: [facility] ENDOSCOPY;  Service: Endoscopy;  Laterality: N/A;  . CORONARY STENT PLACEMENT     LAD x 1  . ganglion cyst removal  yrs ago   x 2  . ILIAC VEIN ANGIOPLASTY / STENTING  02/15/2015  . INTRAVASCULAR PRESSURE WIRE/FFR STUDY N/A 04/05/2017   Procedure: Intravascular Pressure Wire/FFR Study;  Surgeon: Swaz[place], Peter M, MD;  Location:  [place] INVASIVE CV LAB;  Service: Cardiovascular;  Laterality: N/A;  . IVC FILTER INSERTION  2016  . IVC FILTER REMOVAL  02/15/2015   at [facility]  . LEFT HEART CATH AND CORONARY ANGIOGRAPHY N/A 04/05/2017   Procedure: Left Heart Cath and Coronary Angiography;  Surgeon: Martinique, Peter M, MD;  Location: [place] CV LAB;  Service: Cardiovascular;  Laterality: N/A;  . LEFT HEART CATHETERIZATION WITH CORONARY ANGIOGRAM N/A 09/25/2011   Procedure: LEFT HEART CATHETERIZATION WITH CORONARY ANGIOGRAM;  Surgeon: Sanda Klein, MD;  Location: [place] CATH LAB;  Service: Cardiovascular;  Laterality: N/A;  . multiple bladder surgeries to remove mesh    . ROTATOR CUFF REPAIR Right   . stent to groin Left 08/2014   left leg  . TENDON REPAIR Right 07/10/2017   Procedure: RIGHT PERONEAL TENDON REPAIR;  Surgeon: [name], DPM;  Location: [place];  Service: Podiatry;  Laterality: Right;  . TENDON REPAIR Right  05/27/2019   Procedure: PERONEAL TENDON REPAIR x2;  Surgeon: [name], DPM;  Location: [facility] ORS;  Service: Podiatry;  Laterality: Right;    Outpatient Medications Prior to Visit  Medication Sig Dispense Refill  . acetaminophen (TYLENOL) 500 MG tablet Take 500-1,000 mg by mouth 2 (two) times daily as needed for headache (pain).    Marland Kitchen amLODipine (NORVASC) 2.5 MG tablet TAKE 1 TABLET(2.5 MG) BY MOUTH DAILY (Patient taking differently: Take 2.5 mg by mouth daily.) 90 tablet 3  . Ascorbic Acid (VITAMIN C) 1000 MG tablet Take 2,000 mg by mouth daily.    Marland Kitchen aspirin EC 81 MG tablet Take 1 tablet (81 mg total) by mouth daily. 90 tablet 3  . bisacodyl (DULCOLAX) 5 MG EC tablet Take 10 mg by mouth daily.     . cholecalciferol (VITAMIN D3) 25 MCG (1000 UNIT) tablet Take 1,000 Units by mouth daily.     . clobetasol ointment (TEMOVATE) [id number] % Apply 1 application topically 2 (two) times daily. For vaginal itching    . Collagen Hydrolysate POWD Take 1 Scoop by mouth daily. Mix in coffee and drink    . ezetimibe (ZETIA) 10 MG tablet Take 1 tablet (10 mg total) by mouth daily. 90 tablet 3  . furosemide (LASIX) 80 MG tablet Take 1 tablet (80 mg total) by mouth as needed for edema (for a weight over 205 pounds.). (Patient taking differently: Take 80 mg by mouth See admin instructions. Take one tablet (80 mg) by mouth every morning, may also take one tablet (80 mg) in the afternoon as needed for weight gain of 4-5 lbs) 180 tablet 3  . isosorbide mononitrate (IMDUR) 30 MG 24 hr tablet Take 2 tablets (60 mg total) by mouth 2 (two) times daily. (Patient taking differently: Take 60 mg by mouth daily.) 360 tablet 3  . KLOR-CON M20 20 MEQ tablet Take 1 tablet (20 mEq total) by mouth daily. (Patient taking differently: Take 20 mEq by mouth See admin instructions. Take one tablet (20 meq) by mouth every morning and with each afternoon dose of lasix) 90 tablet 3  . levothyroxine (SYNTHROID, LEVOTHROID) 112 MCG tablet Take 112  mcg by mouth daily before breakfast.   4  . metolazone (ZAROXOLYN) 2.5 MG tablet Take as needed when instructed by provider only. (Patient taking differently: Take 2.5 mg by mouth daily.) 45 tablet 3  . nitroGLYCERIN (NITROSTAT) 0.4 MG SL tablet Place 1 tablet (0.4 mg total) under the tongue every 5 (five) minutes as needed for chest pain. 25 tablet 2  .  nystatin (MYCOSTATIN/NYSTOP) powder Apply 1 application topically every other day.    Marland Kitchen OVER THE COUNTER MEDICATION Take 2 tablets by mouth daily. "Focus factor"    . OVER THE COUNTER MEDICATION Take 1 Dose by mouth 3 (three) times a week. Liver detox  (tbsp lemon juice, tbsp honey, nutmeg, cinnamon, ginger, turmeric)    . OVER THE COUNTER MEDICATION Take 1 Scoop by mouth daily. Beet root powder - mix in coffee and drink    . pravastatin (PRAVACHOL) 80 MG tablet Take 80 mg by mouth at bedtime.    . traMADol (ULTRAM) 50 MG tablet Take 50 mg by mouth daily as needed for severe pain.     No facility-administered medications prior to visit.     Allergies:   Atorvastatin, Penicillins, Shellfish allergy, Aminophylline, Iodinated diagnostic agents, Latex, Oxybutynin chloride, Vancomycin, Adhesive [tape], Betadine [povidone iodine], Ciprofloxacin, Codeine, and Ranolazine   Social History   Socioeconomic History  . Marital status: Married    Spouse name: Not on file  . Number of children: Not on file  . Years of education: Not on file  . Highest education level: Not on file  Occupational History  . Not on file  Tobacco Use  . Smoking status: Never Smoker  . Smokeless tobacco: Never Used  Vaping Use  . Vaping Use: Never used  Substance and Sexual Activity  . Alcohol use: No  . Drug use: No  . Sexual activity: Not on file  Other Topics Concern  . Not on file  Social History Narrative  . Not on file   Social Determinants of Health   Financial Resource Strain: Not on file  Food Insecurity: No Food Insecurity  . Worried About Ship broker in the Last Year: Never true  . Ran Out of Food in the Last Year: Never true  Transportation Needs: No Transportation Needs  . Lack of Transportation (Medical): No  . Lack of Transportation (Non-Medical): No  Physical Activity: Not on file  Stress: Not on file  Social Connections: Not on file     Family History:  The patient's family history includes Cancer in her mother; Heart disease in her brother, father, and sister.   ROS:   Please see the history of present illness.    ROS All other systems are reviewed and are negative.   PHYSICAL EXAM:   VS:  BP 134/62 (BP Location: Left Arm, Patient Position: Sitting, Cuff Size: Large)   Pulse 80   Ht 5' 5.5" (1.664 m)   Wt 200 lb (90.7 kg)   BMI 32.78 kg/m     General: Alert, oriented x3, no distress, mild to moderately obese Head: no evidence of trauma, PERRL, EOMI, no exophtalmos or lid lag, no myxedema, no xanthelasma; normal ears, nose and oropharynx Neck: normal jugular venous pulsations and no hepatojugular reflux; brisk carotid pulses without delay and no carotid bruits Chest: clear to auscultation, no signs of consolidation by percussion or palpation, normal fremitus, symmetrical and full respiratory excursions Cardiovascular: normal position and quality of the apical impulse, regular rhythm, normal first and second heart sounds, no murmurs, rubs or gallops Abdomen: no tenderness or distention, no masses by palpation, no abnormal pulsatility or arterial bruits, normal bowel sounds, no hepatosplenomegaly Extremities: no clubbing, cyanosis or edema; 2+ radial, ulnar and brachial pulses bilaterally; 2+ right femoral, posterior tibial and dorsalis pedis pulses; 2+ left femoral, posterior tibial and dorsalis pedis pulses; no subclavian or femoral bruits Neurological: grossly nonfocal Psych: Normal  mood and affect   Wt Readings from Last 3 Encounters:  10/06/20 200 lb (90.7 kg)  08/10/20 212 lb 12.8 oz (96.5 kg)  04/25/20  207 lb 3.2 oz (94 kg)      Studies/Labs Reviewed:   EKG:  EKG is ordered today.  It shows normal sinus rhythm, ST segment depression and biphasic T waves inferolaterally, borderline QTC 465 ms  Recent Labs: 10/22/2019: ALT 38 04/11/2020: Magnesium 1.9 08/25/2020: BNP 95.9 09/16/2020: BUN 15; Creatinine, Ser 0.91; Hemoglobin 15.5; Platelets 91; Potassium 4.2; Sodium 138   Lipid Panel    Component Value Date/Time   CHOL 193 11/18/2017 0857   TRIG 212 (H) 11/18/2017 0857   HDL 47 11/18/2017 0857   CHOLHDL 4.1 11/18/2017 0857   CHOLHDL 4.2 06/27/2015 1336   VLDL 48 (H) 06/27/2015 1336   LDLCALC 104 (H) 11/18/2017 0857      Labs 03/23/2020 Total cholesterol 145, HDL 55, LDL 69, triglycerides 118 TSH 0.886  09/21/2020 Total cholesterol 122, HDL 56, LDL 47, triglycerides 101   CATH 04/05/2017   LM lesion, 70 %stenosed.  Mid LAD lesion, 0 %stenosed at site of prior stent.  Ost Cx lesion, 60 %stenosed.  Prox RCA-1 lesion, 20 %stenosed.  Prox RCA-2 lesion, 30 %stenosed.  The left ventricular systolic function is normal.  LV end diastolic pressure is normal.  The left ventricular ejection fraction is 55-65% by visual estimate.   1. 70% eccentric distal left main stenosis. Angiographically similar to 2016. FFR 0.89 into the LAD and 0.86 into the LCx suggesting that this lesion is not flow limiting.  2. Otherwise nonobstructive CAD. Patent stent in LAD. 3. Normal LV function 4. Normal LVEDP  Plan: recommend continued medical therapy.   ASSESSMENT:    1. Chronic diastolic heart failure ([place])   2. Coronary artery disease involving native coronary artery of native heart without angina pectoris   3. Essential hypertension   4. PAD (peripheral artery disease) ([place])   5. Hypercholesterolemia   6. Moderate obesity      PLAN:  In order of problems listed above:  1. CHF: It has always been difficult to assess her volume status due to body habitus, but she never  shows marked findings of right heart failure.  At her last appointment her BNP was much higher than baseline, without overt jugular venous distention or much in the way of lower extremity edema.  Seems to be doing well with a weight of 200 pounds, definitely need to try to keep her under 205 pounds.  Most recent renal function and potassium levels were normal on current diuretic regimen.  Still, need to be careful since she has also had problems with acute kidney injury from excessive diuretics in the past. 2. CAD:  She is known to have a moderate stenosis of the left coronary artery that has been assessed twice in 2016 and 2018, including with fractional flow reserve and found not to be significant.  She does not have angina pectoris.  She does not tolerate beta-blockers due to bradycardia and ranolazine caused severe constipation. 3. HTN: Adequately controlled.  Blood pressure should always be checked on the right arm. 4. PAD: Asymptomatic, no claudication or subclavian steal syndrome symptoms..  Blood pressure is lower in the left arm. 5. Hyperlipidemia: All lipid parameters appear to be in desirable range on the current statin-ezetimibe combination. 6. Obesity: Remains moderately obese and this has always made it a little harder to assess her volume status.   Medication Adjustments/Labs  and Tests Ordered: Current medicines are reviewed at length with the patient today.  Concerns regarding medicines are outlined above.  Medication changes, Labs and Tests ordered today are listed in the Patient Instructions below. Patient Instructions  Medication Instructions:  No changes *If you need a refill on your cardiac medications before your next appointment, please call your pharmacy*   Lab Work: None ordered If you have labs (blood work) drawn today and your tests are completely normal, you will receive your results only by: Marland Kitchen MyChart Message (if you have MyChart) OR . A paper copy in the mail If you  have any lab test that is abnormal or we need to change your treatment, we will call you to review the results.   Testing/Procedures: None ordered   Follow-Up: At [facility], you and your health needs are our priority.  As part of our continuing mission to provide you with exceptional heart care, we have created designated Provider Care Teams.  These Care Teams include your primary Cardiologist (physician) and Advanced Practice Providers (APPs -  Physician Assistants and Nurse Practitioners) who all work together to provide you with the care you need, when you need it.  We recommend signing up for the patient portal called "MyChart".  Sign up information is provided on this After Visit Summary.  MyChart is used to connect with patients for Virtual Visits (Telemedicine).  Patients are able to view lab/test results, encounter notes, upcoming appointments, etc.  Non-urgent messages can be sent to your provider as well.   To learn more about what you can do with MyChart, go to [email or web address].    Your next appointment:   3 month(s)  The format for your next appointment:   In Person  Provider:   You may see Sanda Klein, MD or one of the following Advanced Practice Providers on your designated Care Team:    Almyra Deforest, PA-C  Fabian Sharp, [place] or   Roby Lofts, PA-C          Signed, Sanda Klein, MD  10/08/2020 3:58 PM    [address], [place], [place]  [address] Phone: [phone]; Fax: [phone]

## 2020-10-20 DIAGNOSIS — Z466 Encounter for fitting and adjustment of urinary device: Secondary | ICD-10-CM | POA: Diagnosis not present

## 2020-10-24 ENCOUNTER — Other Ambulatory Visit: Payer: Self-pay

## 2020-10-25 DIAGNOSIS — I10 Essential (primary) hypertension: Secondary | ICD-10-CM | POA: Diagnosis not present

## 2020-10-25 DIAGNOSIS — I2510 Atherosclerotic heart disease of native coronary artery without angina pectoris: Secondary | ICD-10-CM | POA: Diagnosis not present

## 2020-10-25 DIAGNOSIS — K7460 Unspecified cirrhosis of liver: Secondary | ICD-10-CM | POA: Diagnosis not present

## 2020-10-25 DIAGNOSIS — I5032 Chronic diastolic (congestive) heart failure: Secondary | ICD-10-CM | POA: Diagnosis not present

## 2020-10-25 DIAGNOSIS — I130 Hypertensive heart and chronic kidney disease with heart failure and stage 1 through stage 4 chronic kidney disease, or unspecified chronic kidney disease: Secondary | ICD-10-CM | POA: Diagnosis not present

## 2020-10-25 DIAGNOSIS — E782 Mixed hyperlipidemia: Secondary | ICD-10-CM | POA: Diagnosis not present

## 2020-10-25 DIAGNOSIS — I739 Peripheral vascular disease, unspecified: Secondary | ICD-10-CM | POA: Diagnosis not present

## 2020-10-25 DIAGNOSIS — E039 Hypothyroidism, unspecified: Secondary | ICD-10-CM | POA: Diagnosis not present

## 2020-10-26 DIAGNOSIS — N952 Postmenopausal atrophic vaginitis: Secondary | ICD-10-CM | POA: Diagnosis not present

## 2020-10-26 DIAGNOSIS — R32 Unspecified urinary incontinence: Secondary | ICD-10-CM | POA: Diagnosis not present

## 2020-10-26 DIAGNOSIS — N3281 Overactive bladder: Secondary | ICD-10-CM | POA: Diagnosis not present

## 2020-10-26 DIAGNOSIS — Z936 Other artificial openings of urinary tract status: Secondary | ICD-10-CM | POA: Diagnosis not present

## 2020-10-27 DIAGNOSIS — J449 Chronic obstructive pulmonary disease, unspecified: Secondary | ICD-10-CM | POA: Diagnosis not present

## 2020-10-27 DIAGNOSIS — R059 Cough, unspecified: Secondary | ICD-10-CM | POA: Diagnosis not present

## 2020-10-28 ENCOUNTER — Other Ambulatory Visit: Payer: Self-pay

## 2020-10-28 MED ORDER — KLOR-CON M20 20 MEQ PO TBCR: 20.0000 meq | EXTENDED_RELEASE_TABLET | ORAL | 1 refills | Status: DC

## 2020-11-07 DIAGNOSIS — J449 Chronic obstructive pulmonary disease, unspecified: Secondary | ICD-10-CM | POA: Diagnosis not present

## 2020-11-07 DIAGNOSIS — R059 Cough, unspecified: Secondary | ICD-10-CM | POA: Diagnosis not present

## 2020-11-08 DIAGNOSIS — Z466 Encounter for fitting and adjustment of urinary device: Secondary | ICD-10-CM | POA: Diagnosis not present

## 2020-11-11 ENCOUNTER — Telehealth: Payer: Self-pay | Admitting: Cardiovascular Disease

## 2020-11-11 MED ORDER — AMLODIPINE BESYLATE 2.5 MG PO TABS: 2.5000 mg | ORAL_TABLET | Freq: Every day | ORAL | 3 refills | Status: DC

## 2020-11-11 MED ORDER — EZETIMIBE 10 MG PO TABS: 10.0000 mg | ORAL_TABLET | Freq: Every day | ORAL | 3 refills | Status: DC

## 2020-11-11 MED ORDER — ISOSORBIDE MONONITRATE ER 30 MG PO TB24: 60.0000 mg | ORAL_TABLET | Freq: Two times a day (BID) | ORAL | 3 refills | Status: DC

## 2020-11-11 MED ORDER — FUROSEMIDE 80 MG PO TABS: 80.0000 mg | ORAL_TABLET | ORAL | 3 refills | Status: DC | PRN

## 2020-11-11 MED ORDER — KLOR-CON M20 20 MEQ PO TBCR: 20.0000 meq | EXTENDED_RELEASE_TABLET | ORAL | 3 refills | Status: DC

## 2020-11-11 NOTE — Telephone Encounter (Signed)
Refill sent to the pharmacy electronically.  

## 2020-11-11 NOTE — Telephone Encounter (Signed)
   *  STAT* If patient is at the pharmacy, call can be transferred to refill team.   1. Which medications need to be refilled? (please list name of each medication and dose if known)   amLODipine (NORVASC) 2.5 MG tablet    ezetimibe (ZETIA) 10 MG tablet    furosemide (LASIX) 80 MG tablet    isosorbide mononitrate (IMDUR) 30 MG 24 hr tablet    KLOR-CON M20 20 MEQ tablet   2. Which pharmacy/location (including street and city if local pharmacy) [facility]  3. Do they need a 30 day or 90 day supply? 90 days

## 2020-11-14 DIAGNOSIS — N183 Chronic kidney disease, stage 3 unspecified: Secondary | ICD-10-CM | POA: Diagnosis not present

## 2020-11-14 DIAGNOSIS — E782 Mixed hyperlipidemia: Secondary | ICD-10-CM | POA: Diagnosis not present

## 2020-11-14 DIAGNOSIS — I5032 Chronic diastolic (congestive) heart failure: Secondary | ICD-10-CM | POA: Diagnosis not present

## 2020-11-14 DIAGNOSIS — I130 Hypertensive heart and chronic kidney disease with heart failure and stage 1 through stage 4 chronic kidney disease, or unspecified chronic kidney disease: Secondary | ICD-10-CM | POA: Diagnosis not present

## 2020-11-14 DIAGNOSIS — E039 Hypothyroidism, unspecified: Secondary | ICD-10-CM | POA: Diagnosis not present

## 2020-11-15 DIAGNOSIS — G301 Alzheimer's disease with late onset: Secondary | ICD-10-CM | POA: Diagnosis not present

## 2020-11-15 DIAGNOSIS — N1831 Chronic kidney disease, stage 3a: Secondary | ICD-10-CM | POA: Diagnosis not present

## 2020-11-15 DIAGNOSIS — I4891 Unspecified atrial fibrillation: Secondary | ICD-10-CM | POA: Diagnosis not present

## 2020-11-15 DIAGNOSIS — M19011 Primary osteoarthritis, right shoulder: Secondary | ICD-10-CM | POA: Diagnosis not present

## 2020-11-16 DIAGNOSIS — N3281 Overactive bladder: Secondary | ICD-10-CM | POA: Diagnosis not present

## 2020-11-16 DIAGNOSIS — N3946 Mixed incontinence: Secondary | ICD-10-CM | POA: Diagnosis not present

## 2020-11-16 DIAGNOSIS — L900 Lichen sclerosus et atrophicus: Secondary | ICD-10-CM | POA: Diagnosis not present

## 2020-11-16 DIAGNOSIS — Z936 Other artificial openings of urinary tract status: Secondary | ICD-10-CM | POA: Diagnosis not present

## 2020-12-01 DIAGNOSIS — N200 Calculus of kidney: Secondary | ICD-10-CM | POA: Diagnosis not present

## 2020-12-01 DIAGNOSIS — N3020 Other chronic cystitis without hematuria: Secondary | ICD-10-CM | POA: Diagnosis not present

## 2020-12-12 DIAGNOSIS — Z9359 Other cystostomy status: Secondary | ICD-10-CM | POA: Diagnosis not present

## 2020-12-15 DIAGNOSIS — N1831 Chronic kidney disease, stage 3a: Secondary | ICD-10-CM | POA: Diagnosis not present

## 2020-12-15 DIAGNOSIS — G301 Alzheimer's disease with late onset: Secondary | ICD-10-CM | POA: Diagnosis not present

## 2020-12-15 DIAGNOSIS — I4891 Unspecified atrial fibrillation: Secondary | ICD-10-CM | POA: Diagnosis not present

## 2020-12-15 DIAGNOSIS — M19011 Primary osteoarthritis, right shoulder: Secondary | ICD-10-CM | POA: Diagnosis not present

## 2020-12-28 ENCOUNTER — Telehealth: Payer: Self-pay | Admitting: Cardiovascular Disease

## 2020-12-28 MED ORDER — KLOR-CON M20 20 MEQ PO TBCR: 20.0000 meq | EXTENDED_RELEASE_TABLET | ORAL | 3 refills | Status: DC

## 2020-12-28 MED ORDER — FUROSEMIDE 80 MG PO TABS: 80.0000 mg | ORAL_TABLET | ORAL | 3 refills | Status: DC | PRN

## 2020-12-28 NOTE — Telephone Encounter (Signed)
*  STAT* If patient is at the pharmacy, call can be transferred to refill team.   1. Which medications need to be refilled? (please list name of each medication and dose if known) furosemide (LASIX) 80 MG tablet  KLOR-CON M20 20 MEQ tablet    2. Which pharmacy/location (including street and city if local pharmacy) is medication to be sent to? [facility] - [place], [place] - [place] [address]  3. Do they need a 30 day or 90 day supply? 90  Patient state she only had one pill left of the furosemide

## 2021-01-06 ENCOUNTER — Ambulatory Visit: Payer: HMO | Admitting: Cardiovascular Disease

## 2021-01-06 ENCOUNTER — Other Ambulatory Visit: Payer: Self-pay

## 2021-01-06 ENCOUNTER — Encounter: Payer: Self-pay | Admitting: Cardiovascular Disease

## 2021-01-06 VITALS — BP 130/82 | HR 77 | Ht 65.0 in | Wt 190.6 lb

## 2021-01-06 DIAGNOSIS — E669 Obesity, unspecified: Secondary | ICD-10-CM

## 2021-01-06 DIAGNOSIS — I5032 Chronic diastolic (congestive) heart failure: Secondary | ICD-10-CM

## 2021-01-06 DIAGNOSIS — E7800 Pure hypercholesterolemia, unspecified: Secondary | ICD-10-CM | POA: Diagnosis not present

## 2021-01-06 DIAGNOSIS — I739 Peripheral vascular disease, unspecified: Secondary | ICD-10-CM

## 2021-01-06 DIAGNOSIS — I10 Essential (primary) hypertension: Secondary | ICD-10-CM

## 2021-01-06 DIAGNOSIS — I2510 Atherosclerotic heart disease of native coronary artery without angina pectoris: Secondary | ICD-10-CM

## 2021-01-06 MED ORDER — FUROSEMIDE 80 MG PO TABS: 80.0000 mg | ORAL_TABLET | Freq: Every day | ORAL | 3 refills | Status: DC

## 2021-01-06 NOTE — Progress Notes (Signed)
Patient ID: AYELIN THORNBURY, female   DOB: 1933/07/24, 87 y.o.   MRN: [id number] Patient ID: LASHAI STURGELL, female   DOB: 1933/07/04, 87 y.o.   MRN: [id number]    Cardiology Office Note    Date:  01/07/2021   ID:  ANHAR RABORN, DOB [birth year], MRN [id number]  PCP:  Georgianne Fick, MD  Cardiologist:   Thurmon Fair, MD   chief complaint: CHF  History of Present Illness:  Jill Preston is a 87 y.o. female returning in follow-up for CAD and CHF.  She is doing well from a cardiac point of view.  She has been regulating her dose of diuretic appropriately.  She takes furosemide 80 mg daily and occasionally takes an extra 40 mg dose in the afternoon.  She has lost substantial weight.  She seems to be doing well now had a weight of just over 190 pounds.  She has only mild dizziness if he stands up too quickly.  She has minimal swelling limited to her right ankle.  She does not have orthopnea or PND.  Exercise tolerance is poor, but a little better than it was a few months ago.  The biggest limitation to her quality of life remains her husband's worsening dementia.  He was recently hospitalized with urosepsis to make the situation even more difficult.  He is up and down all night long, which limits her ability to get any good sleep.  She make sure to drink plenty of water during the day to prevent clogging of her urostomy tube.  She is wearing a leg bag connected to her suprapubic cystostomy.  She chronically sleeps with the head of her adjustable bed elevated at a significant angle, about 30 degrees  Most recent labs show a creatinine of 0.9 and a normal potassium level on 09/16/2020.  Most recent LDL cholesterol is less than 70 (47 on 09/21/2020).  Normal lower extremity venous Dopplers in January 2021.  She has a long history of coronary disease. She underwent proximal LAD bare-metal stenting and balloon angioplasty of the mid LAD in 2013. She has had persistent ischemia in the  territory of the diagonal artery on nuclear stress test performed over the last few years. Coronary angiography performed May 2016 showed a widely patent LAD stent, 60% ostial circumflex stenosis, 20-30% right coronary artery stenosis. There was concern about possible left coronary artery stenosis but fractional flow reserve was normal (0.96 at baseline, 0.91 during intravenous adenosine infusion). She had good anginal response to Ranexa but developed severe constipation and could not tolerate the medication. She has been intolerant to beta blockers due to bradycardia. She has preserved left ventricular systolic function but has had episodes of acute exacerbation of diastolic heart failure attributed to hypertensive heart disease. December 2015, she was critically ill with an acute left iliofemoral DVT with anticoagulation complicated by retroperitoneal hematoma and hemorrhagic shock, requiring placement of an inferior vena cava filter. The filter was removed in June 2016. Coronary angiography in July 2018 showed unchanged anatomy of the eccentric stenosis in the left main coronary artery with noncritical FFR (0.89 in the LAD, 0.86 in the LCx).  Intravascular Pressure Wire/FFR Study  Left Heart Cath and Coronary Angiography  Conclusion     LM lesion, 70 %stenosed.  Mid LAD lesion, 0 %stenosed at site of prior stent.  Ost Cx lesion, 60 %stenosed.  Prox RCA-1 lesion, 20 %stenosed.  Prox RCA-2 lesion, 30 %stenosed.  The left ventricular systolic function is normal.  LV  end diastolic pressure is normal.  The left ventricular ejection fraction is 55-65% by visual estimate.   1. 70% eccentric distal left main stenosis. Angiographically similar to 2016. FFR 0.89 into the LAD and 0.86 into the LCx suggesting that this lesion is not flow limiting.  2. Otherwise nonobstructive CAD. Patent stent in LAD. 3. Normal LV function 4. Normal LVEDP  Plan: recommend continued medical therapy.     Past  Medical History:  Diagnosis Date  . Arthritis    right knee  . Cancer [facility]) yrs ago   skin cancer removed from face  . Chronic diastolic CHF (congestive heart failure) ([place])   . Chronic venous insufficiency    LEA VENOUS, 10/17/2011 - mild reflux in bilateral common femoral veins  . CKD (chronic kidney disease), stage III ([place])   . Coronary artery disease    a. s/p PCI/BMS to prox LAD and balloon angioplasty to mLAD with suboptimal result in 2000. b. Abnl nuc 2012, cath 09/2011 - showed that the overall territory of potential ischemia was small and attributable to a moderately diseased small second diagonal artery. Med rx.  Marland Kitchen Dyspnea    with activity  . Headache   . History of blood transfusion 2017  . Hyperlipidemia   . Hypertension   . Hypertensive heart disease   . Hypothyroidism   . Obesity   . Pneumonia    several times  . Stroke [facility])    pt. states she had "light stroke" in sept. 1980  . Urinary incontinence     Past Surgical History:  Procedure Laterality Date  . ABDOMINAL HYSTERECTOMY  1970's   complete  . ANKLE ARTHROSCOPY Right 07/10/2017   Procedure: ANKLE ARTHROSCOPY;  Surgeon: [name], DPM;  Location: [place];  Service: Podiatry;  Laterality: Right;  . BACK SURGERY  07/26/2016   cervical neck surgery, [place] [facility]  . BLADDER SURGERY  2010   WITH MESH  bladder tach  . bunion removal surgery Bilateral 15 yrs ago  . CARDIAC CATHETERIZATION Left 09/25/2011   Medical management  . CARDIAC CATHETERIZATION Left 03/25/2001   Normal LV function, LAD residual narrowing of less than 10%, normal ramus intermediate, circumflex, and RCA,   . CARDIAC CATHETERIZATION  09/04/1999   LAD, 3x35mm Tetra stent resulting in a reduction of the 80% stenosis to 0% residual  . CARDIAC CATHETERIZATION N/A 01/26/2015   Procedure: Right/Left Heart Cath and Coronary Angiography;  Surgeon: Troy Sine, MD;  Location: [place] CV [facility];  Service: Cardiovascular;   Laterality: N/A;  . CARDIAC CATHETERIZATION N/A 01/27/2015   Procedure: Intravascular Pressure Wire/FFR Study;  Surgeon: Burnell Blanks, MD;  Location: [place] [facility] LAB;  Service: Cardiovascular;  Laterality: N/A;  . CARDIAC CATHETERIZATION N/A 01/27/2015   Procedure: Right Heart Cath;  Surgeon: Burnell Blanks, MD;  Location: [place] [facility];  Service: Cardiovascular;  Laterality: N/A;  . CHOLECYSTECTOMY    . COLONOSCOPY WITH PROPOFOL N/A 03/07/2017   Procedure: COLONOSCOPY WITH PROPOFOL;  Surgeon: Juanita Craver, MD;  Location: [facility];  Service: Endoscopy;  Laterality: N/A;  . CORONARY STENT PLACEMENT     LAD x 1  . ganglion cyst removal  yrs ago   x 2  . ILIAC VEIN ANGIOPLASTY / STENTING  02/15/2015  . INTRAVASCULAR PRESSURE WIRE/FFR STUDY N/A 04/05/2017   Procedure: Intravascular Pressure Wire/FFR Study;  Surgeon: Martinique, Peter M, MD;  Location: [place] [facility];  Service: Cardiovascular;  Laterality: N/A;  . IVC  FILTER INSERTION  2016  . IVC FILTER REMOVAL  02/15/2015   at [facility]  . LEFT HEART CATH AND CORONARY ANGIOGRAPHY N/A 04/05/2017   Procedure: Left Heart Cath and Coronary Angiography;  Surgeon: Martinique, Peter M, MD;  Location: [place] [facility];  Service: Cardiovascular;  Laterality: N/A;  . LEFT HEART CATHETERIZATION WITH CORONARY ANGIOGRAM N/A 09/25/2011   Procedure: LEFT HEART CATHETERIZATION WITH CORONARY ANGIOGRAM;  Surgeon: Sanda Klein, MD;  Location: [place] [facility];  Service: Cardiovascular;  Laterality: N/A;  . multiple bladder surgeries to remove mesh    . ROTATOR CUFF REPAIR Right   . stent to groin Left 08/2014   left leg  . TENDON REPAIR Right 07/10/2017   Procedure: RIGHT PERONEAL TENDON REPAIR;  Surgeon: [name], DPM;  Location: [place];  Service: Podiatry;  Laterality: Right;  . TENDON REPAIR Right 05/27/2019   Procedure: PERONEAL TENDON REPAIR x2;  Surgeon: [name], DPM;  Location: [facility];  Service: Podiatry;  Laterality: Right;     Outpatient Medications Prior to Visit  Medication Sig Dispense Refill  . acetaminophen (TYLENOL) 500 MG tablet Take 500-1,000 mg by mouth 2 (two) times daily as needed for headache (pain).    Marland Kitchen amLODipine (NORVASC) 2.5 MG tablet Take 1 tablet (2.5 mg total) by mouth daily. 90 tablet 3  . Ascorbic Acid (VITAMIN C) 1000 MG tablet Take 2,000 mg by mouth daily.    Marland Kitchen aspirin EC 81 MG tablet Take 1 tablet (81 mg total) by mouth daily. 90 tablet 3  . bisacodyl (DULCOLAX) 5 MG EC tablet Take 10 mg by mouth daily.     . cholecalciferol (VITAMIN D3) 25 MCG (1000 UNIT) tablet Take 1,000 Units by mouth daily.     . clobetasol ointment (TEMOVATE) 4.66 % Apply 1 application topically 2 (two) times daily. For vaginal itching    . Collagen Hydrolysate POWD Take 1 Scoop by mouth daily. Mix in coffee and drink    . ezetimibe (ZETIA) 10 MG tablet Take 1 tablet (10 mg total) by mouth daily. 90 tablet 3  . isosorbide mononitrate (IMDUR) 30 MG 24 hr tablet Take 2 tablets (60 mg total) by mouth 2 (two) times daily. 360 tablet 3  . KLOR-CON M20 20 MEQ tablet Take 1 tablet (20 mEq total) by mouth See admin instructions. Take one tablet (20 meq) by mouth every morning and with each afternoon dose of lasix 180 tablet 3  . levothyroxine (SYNTHROID, LEVOTHROID) 112 MCG tablet Take 112 mcg by mouth daily before breakfast.   4  . metolazone (ZAROXOLYN) 2.5 MG tablet Take as needed when instructed by provider only. (Patient taking differently: Take 2.5 mg by mouth daily.) 45 tablet 3  . nitroGLYCERIN (NITROSTAT) 0.4 MG SL tablet Place 1 tablet (0.4 mg total) under the tongue every 5 (five) minutes as needed for chest pain. 25 tablet 2  . nystatin (MYCOSTATIN/NYSTOP) powder Apply 1 application topically every other day.    Marland Kitchen OVER THE COUNTER MEDICATION Take 2 tablets by mouth daily. "Focus factor"    . OVER THE COUNTER MEDICATION Take 1 Dose by mouth 3 (three) times a week. Liver detox  (tbsp lemon juice, tbsp honey, nutmeg,  cinnamon, ginger, turmeric)    . OVER THE COUNTER MEDICATION Take 1 Scoop by mouth daily. Beet root powder - mix in coffee and drink    . pravastatin (PRAVACHOL) 80 MG tablet Take 80 mg by mouth at bedtime.    . traMADol (ULTRAM) 50  MG tablet Take 50 mg by mouth daily as needed for severe pain.    . furosemide (LASIX) 80 MG tablet Take 1 tablet (80 mg total) by mouth as needed for edema (for a weight over 205 pounds.). 180 tablet 3   No facility-administered medications prior to visit.     Allergies:   Atorvastatin, Penicillins, Shellfish allergy, Aminophylline, Iodinated diagnostic agents, Latex, Oxybutynin chloride, Vancomycin, Adhesive [tape], Betadine [povidone iodine], Ciprofloxacin, Codeine, and Ranolazine   Social History   Socioeconomic History  . Marital status: Married    Spouse name: Not on file  . Number of children: Not on file  . Years of education: Not on file  . Highest education level: Not on file  Occupational History  . Not on file  Tobacco Use  . Smoking status: Never Smoker  . Smokeless tobacco: Never Used  Vaping Use  . Vaping Use: Never used  Substance and Sexual Activity  . Alcohol use: No  . Drug use: No  . Sexual activity: Not on file  Other Topics Concern  . Not on file  Social History Narrative  . Not on file   Social Determinants of Health   Financial Resource Strain: Not on file  Food Insecurity: No Food Insecurity  . Worried About Charity fundraiser in the Last Year: Never true  . Ran Out of Food in the Last Year: Never true  Transportation Needs: No Transportation Needs  . Lack of Transportation (Medical): No  . Lack of Transportation (Non-Medical): No  Physical Activity: Not on file  Stress: Not on file  Social Connections: Not on file     Family History:  The patient's family history includes Cancer in her mother; Heart disease in her brother, father, and sister.   ROS:   Please see the history of present illness.    ROS All  other systems are reviewed and are negative.   PHYSICAL EXAM:   VS:  BP 130/82   Pulse 77   Ht 5\' 5"  (1.651 m)   Wt 190 lb 9.6 oz (86.5 kg)   SpO2 94%   BMI 31.72 kg/m     General: Alert, oriented x3, no distress, mildly obese Head: no evidence of trauma, PERRL, EOMI, no exophtalmos or lid lag, no myxedema, no xanthelasma; normal ears, nose and oropharynx Neck: normal jugular venous pulsations and no hepatojugular reflux; brisk carotid pulses without delay and no carotid bruits Chest: clear to auscultation, no signs of consolidation by percussion or palpation, normal fremitus, symmetrical and full respiratory excursions Cardiovascular: normal position and quality of the apical impulse, regular rhythm, normal first and second heart sounds, no murmurs, rubs or gallops Abdomen: no tenderness or distention, no masses by palpation, no abnormal pulsatility or arterial bruits, normal bowel sounds, no hepatosplenomegaly Extremities: no clubbing, cyanosis; trivial right ankle edema; 2+ radial, ulnar and brachial pulses bilaterally; 2+ right femoral, posterior tibial and dorsalis pedis pulses; 2+ left femoral, posterior tibial and dorsalis pedis pulses; no subclavian or femoral bruits Neurological: grossly nonfocal Psych: Normal mood and affect    Wt Readings from Last 3 Encounters:  01/06/21 190 lb 9.6 oz (86.5 kg)  10/06/20 200 lb (90.7 kg)  08/10/20 212 lb 12.8 oz (96.5 kg)      Studies/Labs Reviewed:   EKG:  EKG is not ordered today.  ECG from 10/06/2020 shows normal sinus rhythm, ST segment depression and biphasic T waves inferolaterally, borderline QTC 465 ms  Recent Labs: 04/11/2020: Magnesium 1.9 08/25/2020: BNP  95.9 09/16/2020: BUN 15; Creatinine, Ser 0.91; Hemoglobin 15.5; Platelets 91; Potassium 4.2; Sodium 138   Lipid Panel    Component Value Date/Time   CHOL 193 11/18/2017 0857   TRIG 212 (H) 11/18/2017 0857   HDL 47 11/18/2017 0857   CHOLHDL 4.1 11/18/2017 0857    CHOLHDL 4.2 06/27/2015 1336   VLDL 48 (H) 06/27/2015 1336   LDLCALC 104 (H) 11/18/2017 0857      Labs 03/23/2020 Total cholesterol 145, HDL 55, LDL 69, triglycerides 118 TSH 0.886  09/21/2020 Total cholesterol 122, HDL 56, LDL 47, triglycerides 101   CATH 04/05/2017   LM lesion, 70 %stenosed.  Mid LAD lesion, 0 %stenosed at site of prior stent.  Ost Cx lesion, 60 %stenosed.  Prox RCA-1 lesion, 20 %stenosed.  Prox RCA-2 lesion, 30 %stenosed.  The left ventricular systolic function is normal.  LV end diastolic pressure is normal.  The left ventricular ejection fraction is 55-65% by visual estimate.   1. 70% eccentric distal left main stenosis. Angiographically similar to 2016. FFR 0.89 into the LAD and 0.86 into the LCx suggesting that this lesion is not flow limiting.  2. Otherwise nonobstructive CAD. Patent stent in LAD. 3. Normal LV function 4. Normal LVEDP  Plan: recommend continued medical therapy.   ASSESSMENT:    1. Chronic diastolic heart failure ([place])   2. Coronary artery disease involving native coronary artery of native heart without angina pectoris   3. Essential hypertension   4. PAD (peripheral artery disease) ([place])   5. Hypercholesterolemia   6. Obesity, mild      PLAN:  In order of problems listed above:  1. CHF: Mrs. Woehrle seems to be entering a period of relative stability for her heart failure.  She is lost some true weight and appears at her "dry weight" is around 190-195 pounds.  We will have her continue weighing herself daily, take furosemide 80 mg every morning and take an additional 40 mg in the afternoon if her weight exceeds 195 pounds.  It seems that she is slowly losing true weight, probably due to the difficulty handling her husband's dementia and we may have to readdress that threshold for additional diuretics in the future. Most recent renal function and potassium levels were normal, but need to be careful since she has had  problems with acute kidney injury from excessive diuretics in the past. 2. CAD: Currently asymptomatic.  She is known to have a moderate stenosis of the left coronary artery that has been assessed twice in 2016 and 2018, including with fractional flow reserve and found not to be significant.  She does not have angina pectoris.  She does not tolerate beta-blockers due to bradycardia and ranolazine caused severe constipation. 3. HTN: Well-controlled.  Blood pressure should always be checked on the right arm. 4. PAD: Blood pressure is low in the left arm due to left subclavian stenosis.  Asymptomatic, no claudication or subclavian steal syndrome symptoms.   5. Hyperlipidemia: All lipid parameters were within desirable range on the current statin-ezetimibe combination 6. Obesity: Gradually losing weight, now only mildly obese, need to constantly be aware that we may need to recalibrate her "dry weight".   Medication Adjustments/Labs and Tests Ordered: Current medicines are reviewed at length with the patient today.  Concerns regarding medicines are outlined above.  Medication changes, Labs and Tests ordered today are listed in the Patient Instructions below. Patient Instructions  Medication Instructions:  Begin taking furosemide 80mg  daily. Take an extra 40mg  (half  tablet) of furosemide ONLY if your weight is over 195lbs.   *If you need a refill on your cardiac medications before your next appointment, please call your pharmacy*   Lab Work: None ordered.    Testing/Procedures: None ordered.   Follow-Up: At [facility], you and your health needs are our priority.  As part of our continuing mission to provide you with exceptional heart care, we have created designated Provider Care Teams.  These Care Teams include your primary Cardiologist (physician) and Advanced Practice Providers (APPs -  Physician Assistants and Nurse Practitioners) who all work together to provide you with the care you  need, when you need it.  We recommend signing up for the patient portal called "MyChart".  Sign up information is provided on this After Visit Summary.  MyChart is used to connect with patients for Virtual Visits (Telemedicine).  Patients are able to view lab/test results, encounter notes, upcoming appointments, etc.  Non-urgent messages can be sent to your provider as well.   To learn more about what you can do with MyChart, go to [email or web address].    Your next appointment:   6 month(s)  The format for your next appointment:   In Person  Provider:   Sanda Klein, MD          Signed, Sanda Klein, MD  01/07/2021 4:42 PM    [address], [place], [place]  [address] Phone: [phone]; Fax: [phone]

## 2021-01-06 NOTE — Patient Instructions (Signed)
Medication Instructions:  Begin taking furosemide 80mg  daily. Take an extra 40mg  (half tablet) of furosemide ONLY if your weight is over 195lbs.   *If you need a refill on your cardiac medications before your next appointment, please call your pharmacy*   Lab Work: None ordered.    Testing/Procedures: None ordered.   Follow-Up: At [facility], you and your health needs are our priority.  As part of our continuing mission to provide you with exceptional heart care, we have created designated Provider Care Teams.  These Care Teams include your primary Cardiologist (physician) and Advanced Practice Providers (APPs -  Physician Assistants and Nurse Practitioners) who all work together to provide you with the care you need, when you need it.  We recommend signing up for the patient portal called "MyChart".  Sign up information is provided on this After Visit Summary.  MyChart is used to connect with patients for Virtual Visits (Telemedicine).  Patients are able to view lab/test results, encounter notes, upcoming appointments, etc.  Non-urgent messages can be sent to your provider as well.   To learn more about what you can do with MyChart, go to [email or web address].    Your next appointment:   6 month(s)  The format for your next appointment:   In Person  Provider:   Sanda Klein, MD

## 2021-01-11 DIAGNOSIS — Z4803 Encounter for change or removal of drains: Secondary | ICD-10-CM | POA: Diagnosis not present

## 2021-01-13 IMAGING — CT CT ABD-PELV W/O CM
2 of 4 series · 17 of 46 positions shown, 19 images · non-contrast
Comparison: 03/22/2011

CLINICAL DATA: Right upper quadrant abdominal pain for 5 days

EXAM:
CT ABDOMEN AND PELVIS WITHOUT CONTRAST
TECHNIQUE: Multidetector CT imaging of the abdomen and pelvis was performed
following the standard protocol without IV contrast.

[Series 2: axial st · axial · 0.81mm/px · z∈[-449,-44]mm · 14 of 93 slices shown, 16 images]
[im 6/93  soft-tissue]
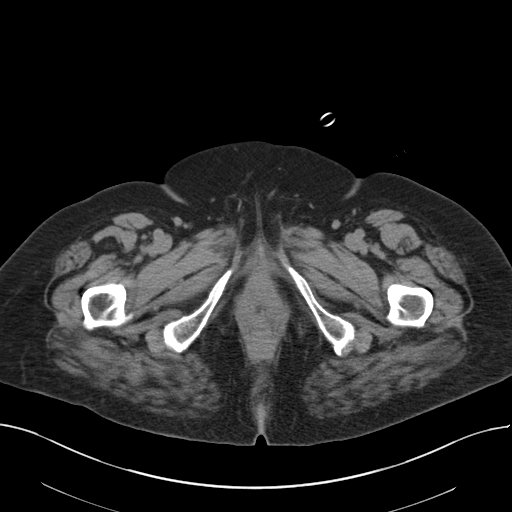
[im 6/93  bone]
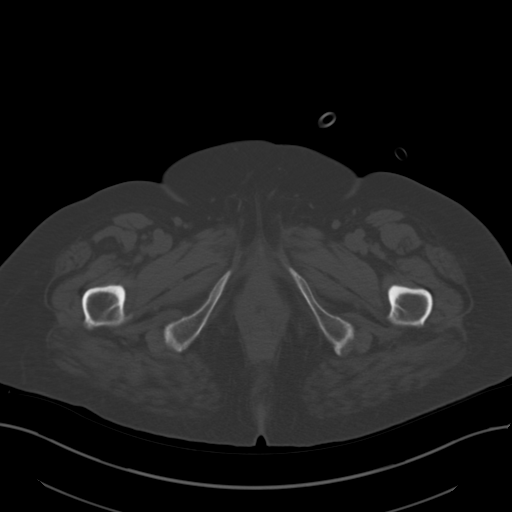
[im 11/93  soft-tissue]
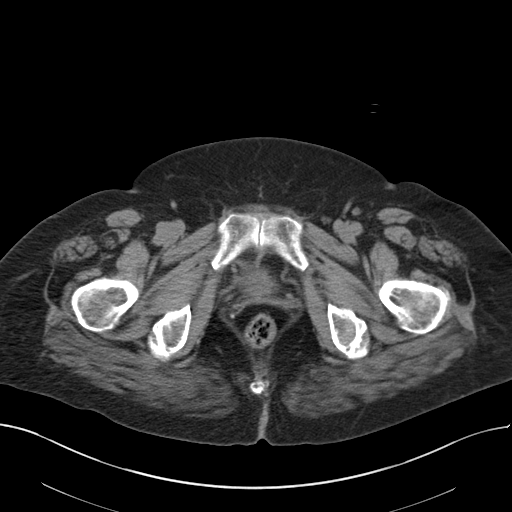
[im 21/93  soft-tissue]
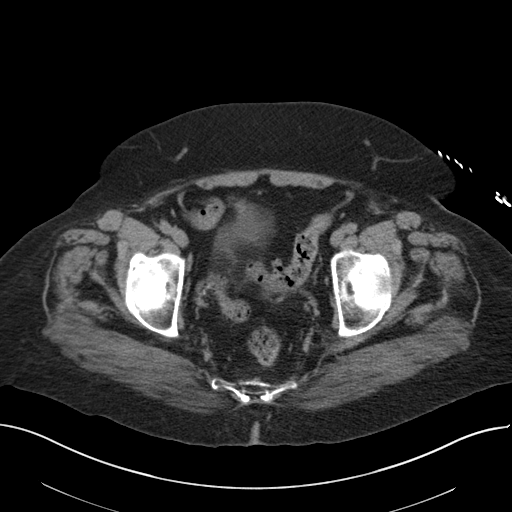
[im 26/93  soft-tissue]
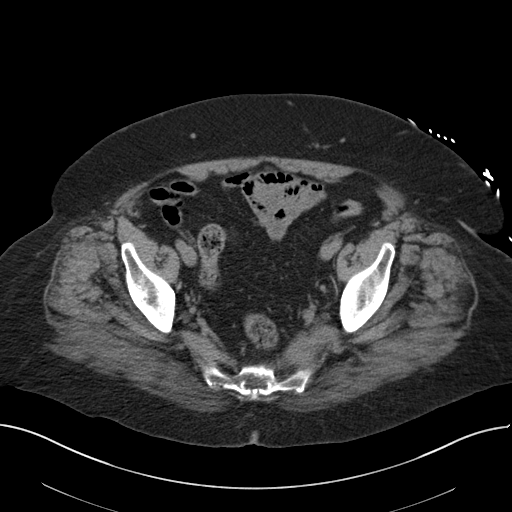
[im 31/93  soft-tissue]
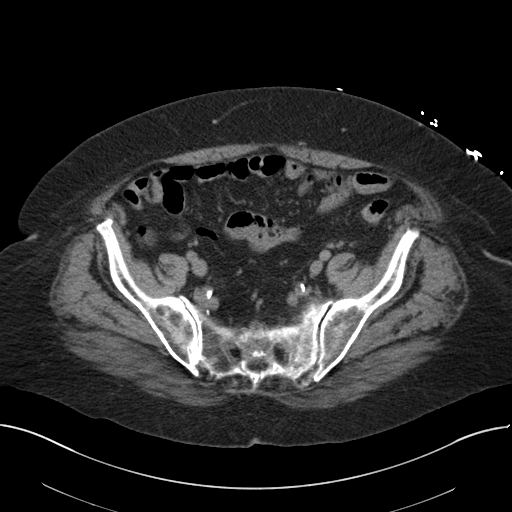
[im 36/93  soft-tissue]
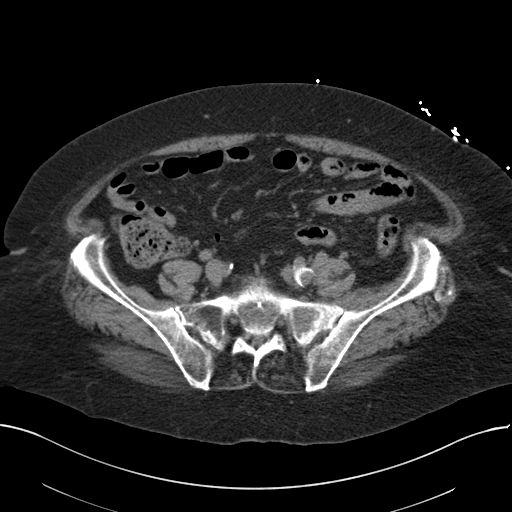
[im 41/93  soft-tissue]
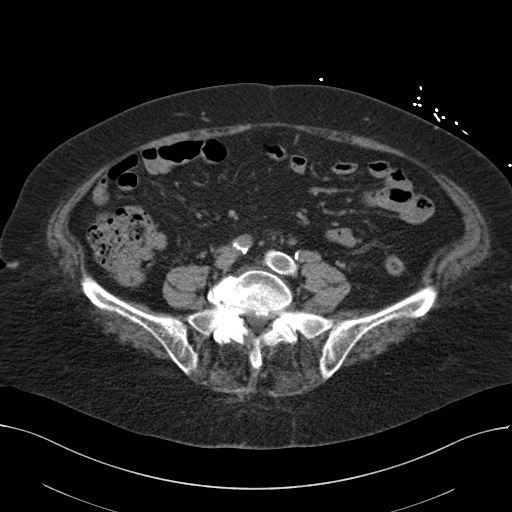
[im 52/93  soft-tissue]
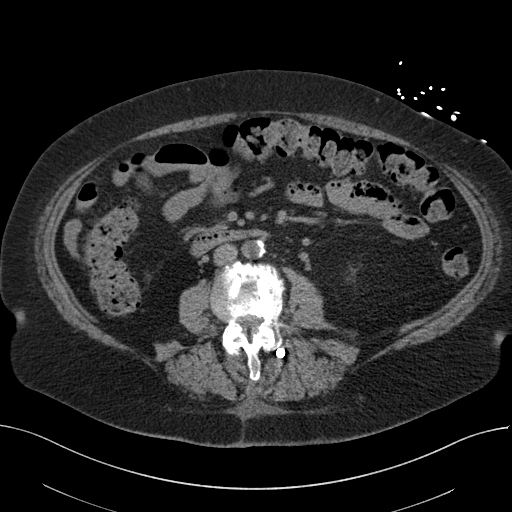
[im 57/93  soft-tissue]
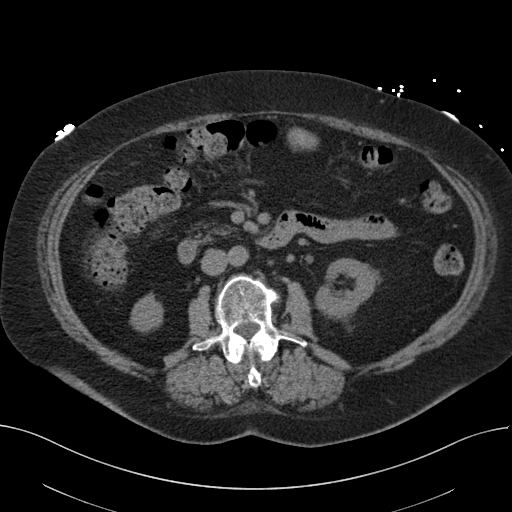
[im 57/93  bone]
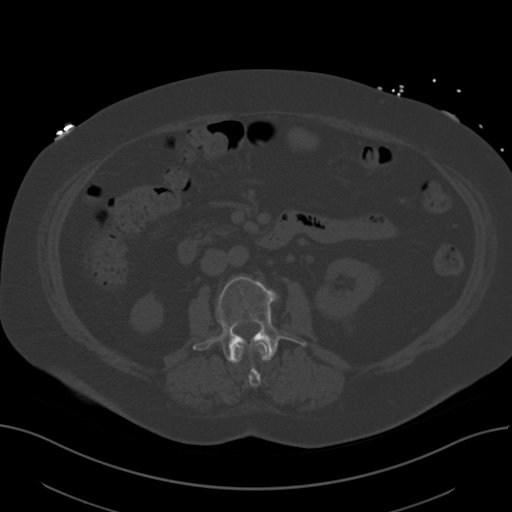
[im 62/93  soft-tissue]
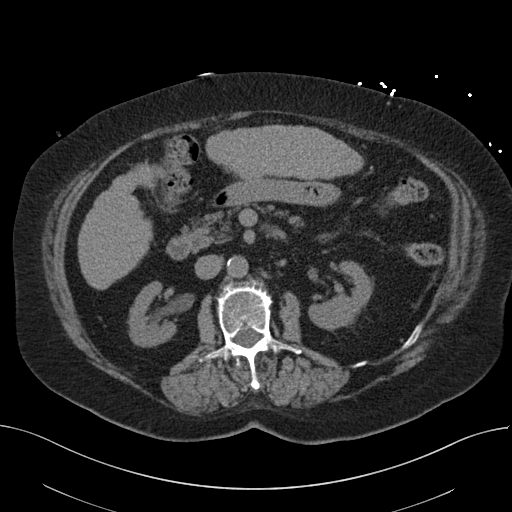
[im 67/93  soft-tissue]
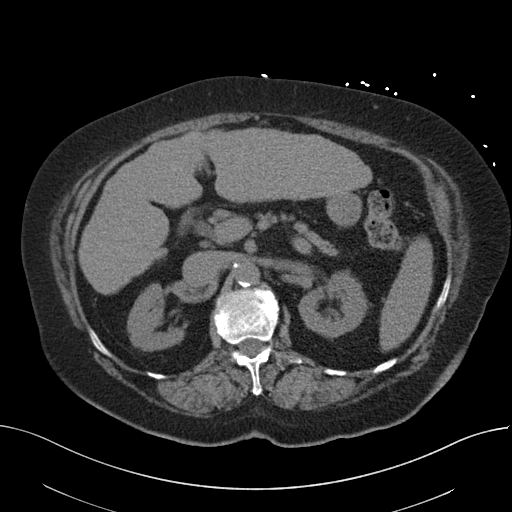
[im 72/93  soft-tissue]
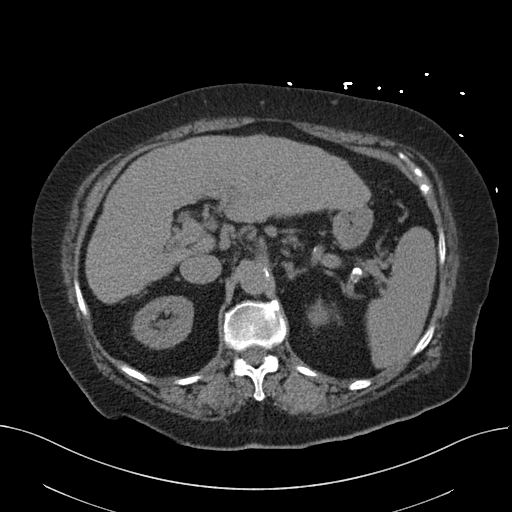
[im 82/93  soft-tissue]
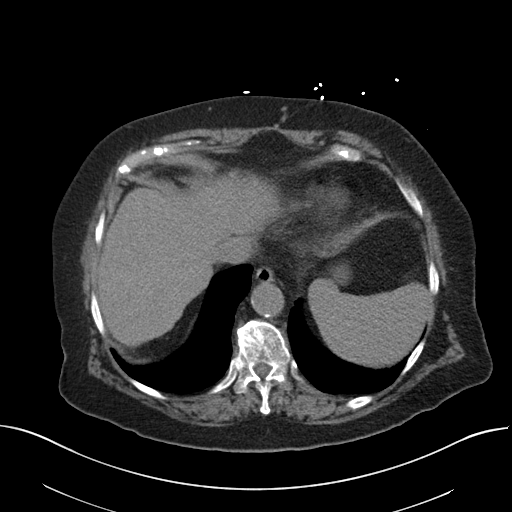
[im 87/93  soft-tissue]
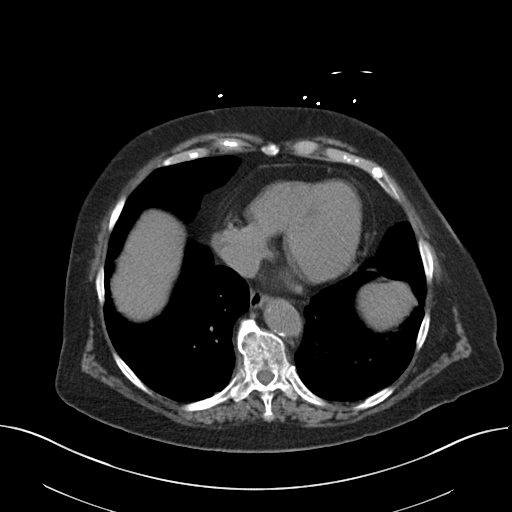

[Series 4: coronal st · coronal · 1.04mm/px · 3 of 130 slices shown]
[im 44/130  soft-tissue]
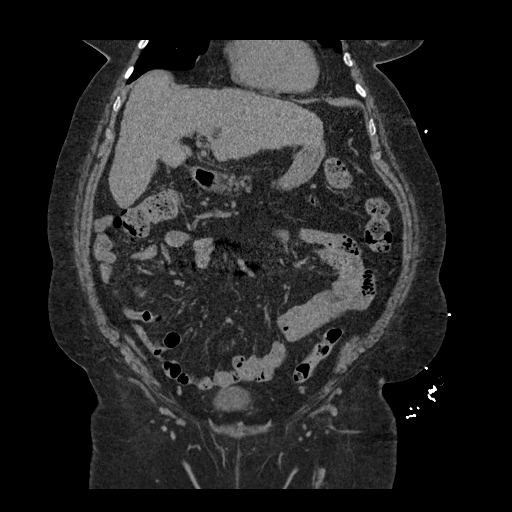
[im 58/130  soft-tissue]
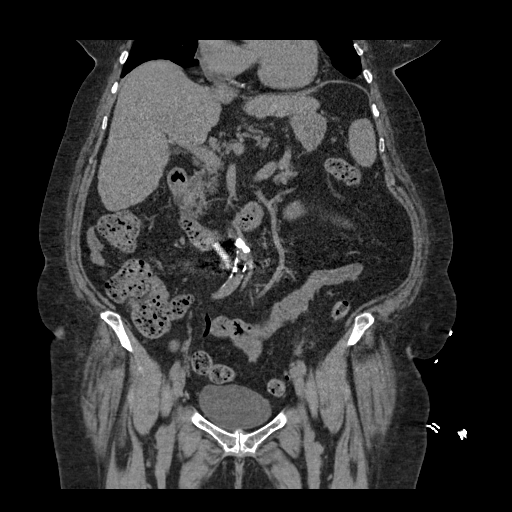
[im 72/130  soft-tissue]
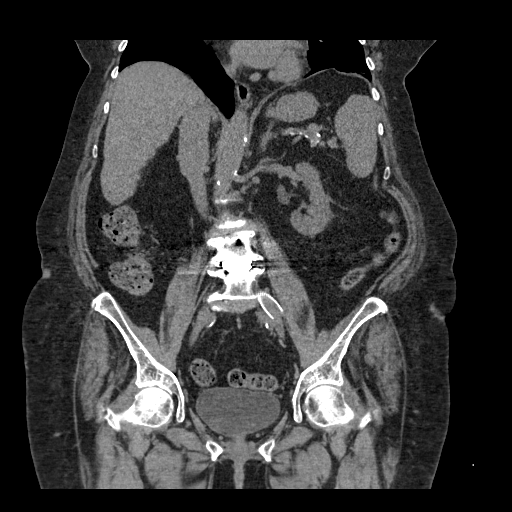

[17 of 46 positions shown; findings below may reference images not displayed]

FINDINGS: Lower chest: No acute pleural or parenchymal lung disease.

Hepatobiliary: Nodular contour of the liver with hypertrophy of the
left lobe compatible with chronic cirrhosis. Gallbladder is
surgically absent. No biliary dilation.

Pancreas: Unremarkable. No pancreatic ductal dilatation or
surrounding inflammatory changes.

Spleen: Borderline enlarged pancreas measuring 12.3 cm in
craniocaudal extent.

Adrenals/Urinary Tract: The adrenals are unremarkable. Punctate 3 mm
nonobstructing calculus left kidney reference image 30 unchanged.
Right kidney is unremarkable. Ureters and bladder are grossly
normal.

Stomach/Bowel: No bowel obstruction or ileus. Normal appendix right
lower quadrant. There is scattered sigmoid diverticulosis without
diverticulitis.

Vascular/Lymphatic: Mild atherosclerosis of the aorta unchanged.
Stent within the left common iliac vein identified. No pathologic
adenopathy.

Reproductive: Status post hysterectomy. No adnexal masses.

Other: No abdominal wall hernia or abnormality. No abdominopelvic
ascites.

Musculoskeletal: No acute displaced fractures. Postsurgical changes
are identified from L4/L5 posterior fusion and discectomy.
Reconstructed images are otherwise unremarkable.
IMPRESSION: 1. Stable punctate nonobstructing left renal calculus.
2. Stable cirrhosis, with borderline splenomegaly likely sequela of
portal venous hypertension.
3. Minimal sigmoid diverticulosis without diverticulitis.

## 2021-01-26 DIAGNOSIS — D692 Other nonthrombocytopenic purpura: Secondary | ICD-10-CM | POA: Diagnosis not present

## 2021-01-26 DIAGNOSIS — M150 Primary generalized (osteo)arthritis: Secondary | ICD-10-CM | POA: Diagnosis not present

## 2021-01-31 ENCOUNTER — Other Ambulatory Visit: Payer: Self-pay

## 2021-01-31 ENCOUNTER — Ambulatory Visit (INDEPENDENT_AMBULATORY_CARE_PROVIDER_SITE_OTHER): Payer: HMO

## 2021-01-31 ENCOUNTER — Ambulatory Visit: Payer: HMO | Admitting: Podiatry

## 2021-01-31 DIAGNOSIS — M79671 Pain in right foot: Secondary | ICD-10-CM

## 2021-01-31 DIAGNOSIS — M19079 Primary osteoarthritis, unspecified ankle and foot: Secondary | ICD-10-CM

## 2021-01-31 DIAGNOSIS — M25371 Other instability, right ankle: Secondary | ICD-10-CM

## 2021-01-31 DIAGNOSIS — M19071 Primary osteoarthritis, right ankle and foot: Secondary | ICD-10-CM

## 2021-01-31 MED ORDER — BETAMETHASONE SOD PHOS & ACET 6 (3-3) MG/ML IJ SUSP
9.0000 mg | Freq: Once | INTRAMUSCULAR | Status: AC
  Administered 2021-01-31: 9 mg

## 2021-01-31 NOTE — Progress Notes (Signed)
  Subjective:  Patient ID: Jill Preston, female    DOB: 1933/07/10,  MRN: [id number]  Chief Complaint  Patient presents with  . Foot Pain    Right foot pain located at the top of foot. Pain associated with edema and tightness.    87 y.o. female presents with the above complaint. History confirmed with patient.   Objective:  Physical Exam: warm, good capillary refill, no trophic changes or ulcerative lesions, normal DP and PT pulses and normal sensory exam.  Right Foot: POP right dorsal midfoot with prominent osteophytes, POP at the ATFL. Mild POP peroneal tendons   No images are attached to the encounter.  Assessment:   1. Foot pain, right   2. Ankle instability, right   3. Arthritis of midfoot    Plan:  Patient was evaluated and treated and all questions answered.  Ankle Instability with chronic ATFL tear -Previous MRI reviewed. -Injection to the ATFL  Midfoot arthritis -Injection delivered as below  Procedure: Injection Ligament Consent: Verbal consent obtained. Location: Right ATFL  Skin Prep: Alcohol. Injectate: 1 cc 0.5% marcaine plain, 1 cc betamethasone acetate-betamethasone sodium phosphate Disposition: Patient tolerated procedure well. Injection site dressed with a band-aid.  Procedure: Joint Injection Location: Right dorsal 2nd TMT joint Skin Prep: Alcohol. Injectate: 0.5 cc 1% lidocaine plain, 0.5 cc betamethasone acetate-betamethasone sodium phosphate Disposition: Patient tolerated procedure well. Injection site dressed with a band-aid.  Return in about 4 weeks (around 02/28/2021) for Tendonitis.

## 2021-02-02 DIAGNOSIS — L820 Inflamed seborrheic keratosis: Secondary | ICD-10-CM | POA: Diagnosis not present

## 2021-02-06 DIAGNOSIS — Z4803 Encounter for change or removal of drains: Secondary | ICD-10-CM | POA: Diagnosis not present

## 2021-02-14 DIAGNOSIS — I5032 Chronic diastolic (congestive) heart failure: Secondary | ICD-10-CM | POA: Diagnosis not present

## 2021-02-14 DIAGNOSIS — I130 Hypertensive heart and chronic kidney disease with heart failure and stage 1 through stage 4 chronic kidney disease, or unspecified chronic kidney disease: Secondary | ICD-10-CM | POA: Diagnosis not present

## 2021-02-14 DIAGNOSIS — E782 Mixed hyperlipidemia: Secondary | ICD-10-CM | POA: Diagnosis not present

## 2021-02-14 DIAGNOSIS — N183 Chronic kidney disease, stage 3 unspecified: Secondary | ICD-10-CM | POA: Diagnosis not present

## 2021-02-26 ENCOUNTER — Other Ambulatory Visit: Payer: Self-pay | Admitting: Cardiovascular Disease

## 2021-02-27 DIAGNOSIS — Z466 Encounter for fitting and adjustment of urinary device: Secondary | ICD-10-CM | POA: Diagnosis not present

## 2021-02-27 LAB — HEPATIC FUNCTION PANEL
ALT: 38 — AB (ref 7–35)
AST: 40 — AB (ref 13–35)
Alkaline Phosphatase: 89 (ref 25–125)

## 2021-02-27 LAB — BASIC METABOLIC PANEL
BUN: 20 (ref 4–21)
CO2: 34 — AB (ref 13–22)
Chloride: 103 (ref 99–108)
Creatinine: 0.9 (ref 0.5–1.1)
Glucose: 98
Potassium: 4 (ref 3.4–5.3)
Sodium: 145 (ref 137–147)

## 2021-02-27 LAB — CBC: RBC: 4.85 (ref 3.87–5.11)

## 2021-02-27 LAB — COMPREHENSIVE METABOLIC PANEL
Albumin: 3.8 (ref 3.5–5.0)
Calcium: 9 (ref 8.7–10.7)
GFR calc Af Amer: 66
GFR calc non Af Amer: 57
Globulin: 3.2

## 2021-02-27 LAB — CBC AND DIFFERENTIAL
HCT: 48 — AB (ref 36–46)
Hemoglobin: 15.9 (ref 12.0–16.0)
Neutrophils Absolute: 2.9
Platelets: 126 — AB (ref 150–399)
WBC: 4.7

## 2021-02-27 LAB — LIPID PANEL
Cholesterol: 155 (ref 0–200)
HDL: 85 — AB (ref 35–70)
LDL Cholesterol: 59
Triglycerides: 57 (ref 40–160)

## 2021-02-27 LAB — TSH: TSH: 1.92 (ref 0.41–5.90)

## 2021-02-28 ENCOUNTER — Ambulatory Visit: Payer: HMO | Admitting: Podiatry

## 2021-03-06 DIAGNOSIS — E782 Mixed hyperlipidemia: Secondary | ICD-10-CM | POA: Diagnosis not present

## 2021-03-06 DIAGNOSIS — E039 Hypothyroidism, unspecified: Secondary | ICD-10-CM | POA: Diagnosis not present

## 2021-03-06 DIAGNOSIS — Z0000 Encounter for general adult medical examination without abnormal findings: Secondary | ICD-10-CM | POA: Diagnosis not present

## 2021-03-06 DIAGNOSIS — J432 Centrilobular emphysema: Secondary | ICD-10-CM | POA: Diagnosis not present

## 2021-03-06 DIAGNOSIS — I739 Peripheral vascular disease, unspecified: Secondary | ICD-10-CM | POA: Diagnosis not present

## 2021-03-06 DIAGNOSIS — I5032 Chronic diastolic (congestive) heart failure: Secondary | ICD-10-CM | POA: Diagnosis not present

## 2021-03-06 DIAGNOSIS — D692 Other nonthrombocytopenic purpura: Secondary | ICD-10-CM | POA: Diagnosis not present

## 2021-03-06 DIAGNOSIS — I10 Essential (primary) hypertension: Secondary | ICD-10-CM | POA: Diagnosis not present

## 2021-03-06 DIAGNOSIS — R32 Unspecified urinary incontinence: Secondary | ICD-10-CM | POA: Diagnosis not present

## 2021-03-06 DIAGNOSIS — I130 Hypertensive heart and chronic kidney disease with heart failure and stage 1 through stage 4 chronic kidney disease, or unspecified chronic kidney disease: Secondary | ICD-10-CM | POA: Diagnosis not present

## 2021-03-06 DIAGNOSIS — N1831 Chronic kidney disease, stage 3a: Secondary | ICD-10-CM | POA: Diagnosis not present

## 2021-03-06 DIAGNOSIS — K7460 Unspecified cirrhosis of liver: Secondary | ICD-10-CM | POA: Diagnosis not present

## 2021-03-07 ENCOUNTER — Other Ambulatory Visit: Payer: Self-pay

## 2021-03-07 ENCOUNTER — Ambulatory Visit (INDEPENDENT_AMBULATORY_CARE_PROVIDER_SITE_OTHER): Payer: HMO | Admitting: Family

## 2021-03-07 ENCOUNTER — Encounter: Payer: Self-pay | Admitting: Family

## 2021-03-07 ENCOUNTER — Ambulatory Visit: Payer: HMO | Admitting: Podiatry

## 2021-03-07 VITALS — BP 112/60 | HR 65 | Temp 97.3°F | Ht 65.0 in | Wt 192.0 lb

## 2021-03-07 DIAGNOSIS — I739 Peripheral vascular disease, unspecified: Secondary | ICD-10-CM | POA: Diagnosis not present

## 2021-03-07 DIAGNOSIS — N1831 Chronic kidney disease, stage 3a: Secondary | ICD-10-CM | POA: Diagnosis not present

## 2021-03-07 DIAGNOSIS — E039 Hypothyroidism, unspecified: Secondary | ICD-10-CM

## 2021-03-07 DIAGNOSIS — M47897 Other spondylosis, lumbosacral region: Secondary | ICD-10-CM

## 2021-03-07 DIAGNOSIS — I5032 Chronic diastolic (congestive) heart failure: Secondary | ICD-10-CM | POA: Diagnosis not present

## 2021-03-07 DIAGNOSIS — E785 Hyperlipidemia, unspecified: Secondary | ICD-10-CM

## 2021-03-07 DIAGNOSIS — I10 Essential (primary) hypertension: Secondary | ICD-10-CM | POA: Diagnosis not present

## 2021-03-07 NOTE — Progress Notes (Signed)
Provider: Arretta Toenjes FNP-C   Merrilee Seashore, MD  Patient Care Team: Merrilee Seashore, MD as PCP - General (Internal Medicine) Sanda Klein, MD as PCP - Cardiology (Cardiology) Croitoru, Dani Gobble, MD as Consulting Physician (Cardiology) Druscilla Brownie, MD as Referring Physician (Dermatology) [name], DPM as Consulting Physician (Podiatry) [name], [name], [place] (Optometry) [name], MD as Referring Physician (Gynecology)  Extended Emergency Contact Information Primary Emergency Contact: [facility] Address: [place], [place] [address] Johnnette Litter of [place] Phone: [phone] Relation: Spouse Secondary Emergency Contact: Chapple,Lewis "Marguerita Beards, [place] of [place] Phone: [phone] Mobile Phone: [phone] Relation: Son  Code Status:  Full Code  Goals of care: Advanced Directive information Advanced Directives 03/07/2021  Does Patient Have a Medical Advance Directive? Yes  Type of Paramedic of [place];Living will  Does patient want to make changes to medical advance directive? No - Patient declined  Copy of [place] in Chart? No - copy requested  Would patient like information on creating a medical advance directive? -     Chief Complaint  Patient presents with   Establish Care    New patient establish care. Here with spouse. All pill bottles present at initial appointment.     HPI:  Pt is a 87 y.o. female seen today to establish care for medical management of chronic diseases.Has a medical history of HTN,chronic diastolic CHF,CKD,PAD,CAD -LAD stent, hypothyroidism,Asthma,DJD,indwelling suprapubic Foley catheter,Hyperlipidemia, bladder Cancer among other conditions.    Blood pressure - checks daily per CHF clinic has been normal blood pressure readings though did bring her B/p log.  CHF - Weight has been stable.denies  any cough,shortness breath or wheezing.Has edema on lower extremities.Also follows up with Orthopedic Dr. March Rummage on right ankle   Hypothyroidism - has been on med since 1965 state takes levothyroxine very early in the morning on an empty stomach.  CKD stage 3 - reports no decreased voiding.  - follows up with Dr.Mathews.Had biopsy 2021 for possible lesion ? Cancer.Has suprapubic foley Catheter changed every 3 weeks.    Osteoarthritis/back pain - worst with activity like sweeping the floor.Tylenol has been effective.  Has lost weight used to weight 230 lbs.Appetite is good  No issues with depression or anxiety.  No fall episode.  Wears reading glasses for reading.[name] Care. Due for shingles   Due for lab work but states had fasting lab work done by previous PCP though none noted on Epic.she will obtian lab work  copy from home.  Past Medical History:  Diagnosis Date   Arthritis    right knee   Cancer ([place]) yrs ago   skin cancer removed from face   Chronic diastolic CHF (congestive heart failure) (HCC)    Chronic venous insufficiency    LEA VENOUS, 10/17/2011 - mild reflux in bilateral common femoral veins   CKD (chronic kidney disease), stage III ([place])    Coronary artery disease    a. s/p PCI/BMS to prox LAD and balloon angioplasty to mLAD with suboptimal result in 2000. b. Abnl nuc 2012, cath 09/2011 - showed that the overall territory of potential ischemia was small and attributable to a moderately diseased small second diagonal artery. Med rx.   Dyspnea    with activity   Headache    History of blood transfusion 2017   Hyperlipidemia    Hypertension  Hypertensive heart disease    Hypothyroidism    Obesity    Pneumonia    several times   Stroke [facility])    pt. states she had "light stroke" in sept. 1980   Urinary incontinence    Past Surgical History:  Procedure Laterality Date   ABDOMINAL HYSTERECTOMY  1970's   complete   ANKLE ARTHROSCOPY Right 07/10/2017    Procedure: ANKLE ARTHROSCOPY;  Surgeon: [name], DPM;  Location: [place];  Service: Podiatry;  Laterality: Right;   BACK SURGERY  07/26/2016   cervical neck surgery, [facility]   BLADDER SURGERY  2010   WITH MESH  bladder tach   bunion removal surgery Bilateral 15 yrs ago   CARDIAC CATHETERIZATION Left 09/25/2011   Medical management   CARDIAC CATHETERIZATION Left 03/25/2001   Normal LV function, LAD residual narrowing of less than 10%, normal ramus intermediate, circumflex, and RCA,    CARDIAC CATHETERIZATION  09/04/1999   LAD, 3x40mm Tetra stent resulting in a reduction of the 80% stenosis to 0% residual   CARDIAC CATHETERIZATION N/A 01/26/2015   Procedure: Right/Left Heart Cath and Coronary Angiography;  Surgeon: Troy Sine, MD;  Location: [place] CV LAB;  Service: Cardiovascular;  Laterality: N/A;   CARDIAC CATHETERIZATION N/A 01/27/2015   Procedure: Intravascular Pressure Wire/FFR Study;  Surgeon: Burnell Blanks, MD;  Location: [place] [facility];  Service: Cardiovascular;  Laterality: N/A;   CARDIAC CATHETERIZATION N/A 01/27/2015   Procedure: Right Heart Cath;  Surgeon: Burnell Blanks, MD;  Location: [place] [facility];  Service: Cardiovascular;  Laterality: N/A;   CHOLECYSTECTOMY     COLONOSCOPY WITH PROPOFOL N/A 03/07/2017   Procedure: COLONOSCOPY WITH PROPOFOL;  Surgeon: Juanita Craver, MD;  Location: [facility];  Service: Endoscopy;  Laterality: N/A;   CORONARY STENT PLACEMENT     LAD x 1   ganglion cyst removal  yrs ago   x 2   ILIAC VEIN ANGIOPLASTY / STENTING  02/15/2015   INTRAVASCULAR PRESSURE WIRE/FFR STUDY N/A 04/05/2017   Procedure: Intravascular Pressure Wire/FFR Study;  Surgeon: Martinique, Peter M, MD;  Location: [place] [facility];  Service: Cardiovascular;  Laterality: N/A;   IVC FILTER INSERTION  2016   IVC FILTER REMOVAL  02/15/2015   at [facility] CATH AND CORONARY ANGIOGRAPHY N/A 04/05/2017   Procedure: Left Heart Cath  and Coronary Angiography;  Surgeon: Martinique, Peter M, MD;  Location: [place] [facility];  Service: Cardiovascular;  Laterality: N/A;   LEFT HEART CATHETERIZATION WITH CORONARY ANGIOGRAM N/A 09/25/2011   Procedure: LEFT HEART CATHETERIZATION WITH CORONARY ANGIOGRAM;  Surgeon: Sanda Klein, MD;  Location: [place] [facility];  Service: Cardiovascular;  Laterality: N/A;   multiple bladder surgeries to remove mesh     ROTATOR CUFF REPAIR Right    stent to groin Left 08/2014   left leg   TENDON REPAIR Right 07/10/2017   Procedure: RIGHT PERONEAL TENDON REPAIR;  Surgeon: [name], DPM;  Location: [facility];  Service: Podiatry;  Laterality: Right;   TENDON REPAIR Right 05/27/2019   Procedure: PERONEAL TENDON REPAIR x2;  Surgeon: [name], DPM;  Location: [facility];  Service: Podiatry;  Laterality: Right;    Allergies  Allergen Reactions   Atorvastatin Anaphylaxis    Patient does not remember; caused pneumonia- took her off of it per patient.    Penicillins Anaphylaxis, Hives and Swelling    Tongue swelling Did it involve swelling of the face/tongue/throat, SOB, or  low BP? Yes Did it involve sudden or severe rash/hives, skin peeling, or any reaction on the inside of your mouth or nose? Yes Did you need to seek medical attention at a hospital or doctor's office? Yes When did it last happen?  23 or 87 year old    If all above answers are "NO", may proceed with cephalosporin use.    Shellfish Allergy Anaphylaxis and Nausea And Vomiting    Severe nausea and vomiting   Aminophylline Itching, Swelling and Rash    Pt experienced burning urination, itching, redness/rash, and swelling of genitals after given this medication via IV push.   Iodinated Diagnostic Agents Swelling and Other (See Comments)    SWELLING REACTION UNSPECIFIED  fFLUSHING   Latex Other (See Comments)    Causes blisters   Oxybutynin Chloride Other (See Comments)    "blisters"   Vancomycin Hives    06/02/2018- Hives arm, back and  chest   Adhesive [Tape] Rash    Paper tape is ok   Betadine [Povidone Iodine] Rash   Ciprofloxacin Hives   Codeine Nausea And Vomiting    .   Ranolazine Other (See Comments)    Constipation  .    Allergies as of 03/07/2021       Reactions   Atorvastatin Anaphylaxis   Patient does not remember; caused pneumonia- took her off of it per patient.    Penicillins Anaphylaxis, Hives, Swelling   Tongue swelling Did it involve swelling of the face/tongue/throat, SOB, or low BP? Yes Did it involve sudden or severe rash/hives, skin peeling, or any reaction on the inside of your mouth or nose? Yes Did you need to seek medical attention at a hospital or doctor's office? Yes When did it last happen?  50 or 87 year old    If all above answers are "NO", may proceed with cephalosporin use.   Shellfish Allergy Anaphylaxis, Nausea And Vomiting   Severe nausea and vomiting   Aminophylline Itching, Swelling, Rash   Pt experienced burning urination, itching, redness/rash, and swelling of genitals after given this medication via IV push.   Iodinated Diagnostic Agents Swelling, Other (See Comments)   SWELLING REACTION UNSPECIFIED  fFLUSHING   Latex Other (See Comments)   Causes blisters   Oxybutynin Chloride Other (See Comments)   "blisters"   Vancomycin Hives   06/02/2018- Hives arm, back and chest   Adhesive [tape] Rash   Paper tape is ok   Betadine [povidone Iodine] Rash   Ciprofloxacin Hives   Codeine Nausea And Vomiting   .   Ranolazine Other (See Comments)   Constipation .        Medication List        Accurate as of March 07, 2021  9:53 AM. If you have any questions, ask your nurse or doctor.          STOP taking these medications    isosorbide mononitrate 30 MG 24 hr tablet Commonly known as: IMDUR Stopped by: Sandrea Hughs, NP       TAKE these medications    amLODipine 2.5 MG tablet Commonly known as: NORVASC Take 1 tablet (2.5 mg total) by mouth daily.    aspirin EC 81 MG tablet Take 1 tablet (81 mg total) by mouth daily.   cholecalciferol 25 MCG (1000 UNIT) tablet Commonly known as: VITAMIN D3 Take 1,000 Units by mouth daily.   Docusate Sodium 100 MG capsule Take 200 mg by mouth daily at 8 pm. If no BM  patient will also take 2 in the pm   ezetimibe 10 MG tablet Commonly known as: ZETIA TAKE 1 TABLET(10 MG) BY MOUTH DAILY   furosemide 80 MG tablet Commonly known as: LASIX Take 1 tablet (80 mg total) by mouth daily. TAKE AN EXTRA 40mg  (half tablet) IN THE AFTERNOON ONLY IF WEIGHT IS OVER 195lbs.   isosorbide dinitrate 30 MG tablet Commonly known as: ISORDIL Take 60 mg by mouth daily at 12 noon. In the morning   Klor-Con M20 20 MEQ tablet Generic drug: potassium chloride SA Take 1 tablet (20 mEq total) by mouth See admin instructions. Take one tablet (20 meq) by mouth every morning and with each afternoon dose of lasix   levothyroxine 112 MCG tablet Commonly known as: SYNTHROID Take 112 mcg by mouth daily before breakfast.   OVER THE COUNTER MEDICATION Take 2 tablets by mouth daily. "Focus factor"   pravastatin 80 MG tablet Commonly known as: PRAVACHOL Take 80 mg by mouth at bedtime.        Review of Systems  Constitutional:  Positive for unexpected weight change. Negative for appetite change, chills, fatigue and fever.  HENT:  Negative for congestion, dental problem, ear discharge, ear pain, facial swelling, hearing loss, nosebleeds, postnasal drip, rhinorrhea, sinus pressure, sinus pain, sneezing, sore throat, tinnitus and trouble swallowing.   Eyes:  Negative for pain, discharge, redness, itching and visual disturbance.  Respiratory:  Negative for cough, chest tightness, shortness of breath and wheezing.   Cardiovascular:  Negative for chest pain, palpitations and leg swelling.  Gastrointestinal:  Negative for abdominal distention, abdominal pain, blood in stool, constipation, diarrhea, nausea and vomiting.   Endocrine: Negative for cold intolerance, heat intolerance, polydipsia, polyphagia and polyuria.  Genitourinary:  Negative for difficulty urinating, dysuria, flank pain and urgency.       Suprapubic foley Catheter   Musculoskeletal:  Positive for arthralgias, back pain and gait problem. Negative for joint swelling, myalgias, neck pain and neck stiffness.       Arthritis on fingers.  Skin:  Negative for color change, pallor, rash and wound.  Neurological:  Negative for dizziness, syncope, speech difficulty, weakness, light-headedness, numbness and headaches.  Hematological:  Does not bruise/bleed easily.  Psychiatric/Behavioral:  Negative for agitation, behavioral problems, confusion, hallucinations, self-injury, sleep disturbance and suicidal ideas. The patient is not nervous/anxious.    Immunization History  Administered Date(s) Administered   PFIZER(Purple Top)SARS-COV-2 Vaccination 11/11/2019, 11/13/2019, 12/08/2019   Pneumococcal Conjugate-13 06/14/2014   Pneumococcal Polysaccharide-23 10/26/2013, 03/30/2020   Tdap 12/19/2015   Pertinent  Health Maintenance Due  Topic Date Due   INFLUENZA VACCINE  04/17/2021   DEXA SCAN  Completed   PNA vac Low Risk Adult  Completed   Fall Risk  03/07/2021  Falls in the past year? 0  Number falls in past yr: 0  Injury with Fall? 0  Risk for fall due to : No Fall Risks  Follow up Falls evaluation completed   Functional Status Survey:    Vitals:   03/07/21 0918  BP: 112/60  Pulse: 65  Temp: (!) 97.3 F (36.3 C)  SpO2: 95%  Weight: 192 lb (87.1 kg)  Height: 5\' 5"  (1.651 m)   Body mass index is 31.95 kg/m. Physical Exam Vitals reviewed.  Constitutional:      General: She is not in acute distress.    Appearance: Normal appearance. She is obese. She is not ill-appearing or diaphoretic.  HENT:     Head: Normocephalic.     Comments:  Upper and lower dentures since she was 87 yrs old     Right Ear: Tympanic membrane, ear canal and  external ear normal. There is no impacted cerumen.     Left Ear: Tympanic membrane, ear canal and external ear normal. There is no impacted cerumen.     Nose: Nose normal. No congestion or rhinorrhea.     Mouth/Throat:     Mouth: Mucous membranes are moist.     Pharynx: Oropharynx is clear. No oropharyngeal exudate or posterior oropharyngeal erythema.  Eyes:     General: No scleral icterus.       Right eye: No discharge.        Left eye: No discharge.     Extraocular Movements: Extraocular movements intact.     Conjunctiva/sclera: Conjunctivae normal.     Pupils: Pupils are equal, round, and reactive to light.  Neck:     Vascular: No carotid bruit.  Cardiovascular:     Rate and Rhythm: Normal rate and regular rhythm.     Pulses: Normal pulses.     Heart sounds: Normal heart sounds. No murmur heard.   No friction rub. No gallop.  Pulmonary:     Effort: Pulmonary effort is normal. No respiratory distress.     Breath sounds: Normal breath sounds. No wheezing, rhonchi or rales.  Chest:     Chest wall: No tenderness.  Abdominal:     General: Bowel sounds are normal. There is no distension.     Palpations: Abdomen is soft. There is no mass.     Tenderness: There is no abdominal tenderness. There is no right CVA tenderness, left CVA tenderness, guarding or rebound.  Genitourinary:    Comments: Suprapubic foley catheter draining amber colored urine  Musculoskeletal:        General: No swelling or tenderness. Normal range of motion.     Cervical back: Normal range of motion. No rigidity or tenderness.     Right lower leg: No edema.     Left lower leg: No edema.  Lymphadenopathy:     Cervical: No cervical adenopathy.  Skin:    General: Skin is warm and dry.     Coloration: Skin is not pale.     Findings: No bruising, erythema, lesion or rash.  Neurological:     Mental Status: She is alert and oriented to person, place, and time.     Cranial Nerves: No cranial nerve deficit.      Sensory: No sensory deficit.     Motor: No weakness.     Coordination: Coordination normal.     Gait: Gait normal.  Psychiatric:        Mood and Affect: Mood normal.        Speech: Speech normal.        Behavior: Behavior normal.        Thought Content: Thought content normal.        Judgment: Judgment normal.    Labs reviewed: Recent Labs    04/11/20 1520 08/10/20 0943 08/22/20 0832 08/25/20 0845 09/16/20 1747  NA 142   < > 143 140 138  K 4.1   < > 2.9* 3.1* 4.2  CL 96   < > 95* 93* 99  CO2 30*   < > 32* 28 29  GLUCOSE 107*   < > 102* 103* 98  BUN 22   < > 19 24 15   CREATININE 0.91   < > 0.96 0.82 0.91  CALCIUM 10.0   < >  9.3 9.4 8.8*  MG 1.9  --   --   --   --    < > = values in this interval not displayed.   No results for input(s): AST, ALT, ALKPHOS, BILITOT, PROT, ALBUMIN in the last 8760 hours. Recent Labs    08/25/20 0845 09/16/20 1747  WBC 6.1 3.9*  NEUTROABS  --  1.9  HGB 15.1 15.5*  HCT 44.6 48.0*  MCV 96 100.0  PLT 140* 91*   Lab Results  Component Value Date   TSH 0.266 (L) 12/12/2017   Lab Results  Component Value Date   HGBA1C 6.1 (H) 01/26/2015   Lab Results  Component Value Date   CHOL 193 11/18/2017   HDL 47 11/18/2017   LDLCALC 104 (H) 11/18/2017   TRIG 212 (H) 11/18/2017   CHOLHDL 4.1 11/18/2017    Significant Diagnostic Results in last 30 days:  No results found.  Assessment/Plan 1. Essential hypertension B/p well controlled  - continue on Amlodipine,furosemide Isorbdil and furosemide  2. Chronic diastolic CHF (congestive heart failure) (HCC) No abrupt weight gain,shortness of breath,cough or wheezing  - continue to monitor weight   3. Dyslipidemia Lates LDL ,TRG not at goal ' continue on  Pravastatin   4. Other osteoarthritis of spine, lumbosacral region Continue current pain regimen   5. Stage 3a chronic kidney disease (HCC) CR at baseline.continue to avoid Nephrotoxins  6. Acquired hypothyroidism Lab Results   Component Value Date   TSH 0.266 (L) 12/12/2017  Reports labs done yesterday will bring a copy of lab work not on EPIC - continue on levothyroxine 112 mcg tablet daily   7. PAD (peripheral artery disease) (HCC) Continue on ASA and Statin    Family/ staff Communication: Reviewed plan of care with patient verbalized understanding  Labs/tests ordered: states had lab work with previous provider yesterday.No results in Epic but will bring a copy of labs from home.  Next Appointment : 6 months for medical management of chronic issues.Fasting Labs prior to visit.    Sandrea Hughs, NP

## 2021-03-07 NOTE — Patient Instructions (Signed)
Please bring copy of blood work that was done 03/07/2021 by previous PCP

## 2021-03-08 DIAGNOSIS — L728 Other follicular cysts of the skin and subcutaneous tissue: Secondary | ICD-10-CM | POA: Diagnosis not present

## 2021-03-10 ENCOUNTER — Other Ambulatory Visit: Payer: Self-pay

## 2021-03-10 ENCOUNTER — Ambulatory Visit: Payer: HMO | Admitting: Podiatry

## 2021-03-10 DIAGNOSIS — M19071 Primary osteoarthritis, right ankle and foot: Secondary | ICD-10-CM

## 2021-03-10 DIAGNOSIS — M19079 Primary osteoarthritis, unspecified ankle and foot: Secondary | ICD-10-CM

## 2021-03-10 DIAGNOSIS — G5761 Lesion of plantar nerve, right lower limb: Secondary | ICD-10-CM | POA: Diagnosis not present

## 2021-03-10 MED ORDER — BETAMETHASONE SOD PHOS & ACET 6 (3-3) MG/ML IJ SUSP
6.0000 mg | Freq: Once | INTRAMUSCULAR | Status: AC
  Administered 2021-03-10: 6 mg

## 2021-03-10 NOTE — Progress Notes (Signed)
  Subjective:  Patient ID: Jill Preston, female    DOB: August 26, 1933,  MRN: [id number]  Chief Complaint  Patient presents with   Foot Pain    4 week follow up right foot tendonitis. Pt state she is still having foot pain.    87 y.o. female presents with the above complaint. History confirmed with patient.   Objective:  Physical Exam: warm, good capillary refill, no trophic changes or ulcerative lesions, normal DP and PT pulses and normal sensory exam.  Right Foot: POP right dorsal midfoot with prominent osteophytes, POP at the 3rd interspace. Mild POP at ATFL.  No images are attached to the encounter.  Assessment:   1. Arthritis of midfoot   2. Morton neuroma, right    Plan:  Patient was evaluated and treated and all questions answered.  Ankle Instability with chronic ATFL tear -Previous MRI reviewed. -Hold off further injection  Midfoot arthritis -Injection delivered as below  Morton Neuroma -Educated on etiology -Injection delivered to the affected interspaces  Procedure: Neuroma Injection Location: Right 3rd interspace Skin Prep: Alcohol. Injectate: 0.5 cc 0.5% marcaine plain, 0.5 cc betamethasone acetate-betamethasone sodium phosphate Disposition: Patient tolerated procedure well. Injection site dressed with a band-aid.   Return in about 6 weeks (around 04/21/2021) for Arthritis, Right.

## 2021-03-16 DIAGNOSIS — I5032 Chronic diastolic (congestive) heart failure: Secondary | ICD-10-CM | POA: Diagnosis not present

## 2021-03-16 DIAGNOSIS — E782 Mixed hyperlipidemia: Secondary | ICD-10-CM | POA: Diagnosis not present

## 2021-03-16 DIAGNOSIS — I130 Hypertensive heart and chronic kidney disease with heart failure and stage 1 through stage 4 chronic kidney disease, or unspecified chronic kidney disease: Secondary | ICD-10-CM | POA: Diagnosis not present

## 2021-03-16 DIAGNOSIS — N183 Chronic kidney disease, stage 3 unspecified: Secondary | ICD-10-CM | POA: Diagnosis not present

## 2021-03-17 ENCOUNTER — Telehealth: Payer: Self-pay | Admitting: Cardiovascular Disease

## 2021-03-17 ENCOUNTER — Other Ambulatory Visit: Payer: Self-pay | Admitting: *Deleted

## 2021-03-17 DIAGNOSIS — D696 Thrombocytopenia, unspecified: Secondary | ICD-10-CM

## 2021-03-17 DIAGNOSIS — T148XXA Other injury of unspecified body region, initial encounter: Secondary | ICD-10-CM

## 2021-03-17 MED ORDER — ASPIRIN EC 81 MG PO TBEC: 81.0000 mg | DELAYED_RELEASE_TABLET | ORAL | 3 refills | Status: DC

## 2021-03-17 NOTE — Telephone Encounter (Signed)
Patient aware and verbalized understanding.    Order placed and med list updated.

## 2021-03-17 NOTE — Telephone Encounter (Signed)
Returned call to patient-patient reports significant bruising to her right leg.  She reports everyday she wakes up she notices new bruising.   She has multiple to right leg and noticed 2 to her left leg today.   She also reports a few bruises to her abdomen.   No pain, no injury that she remembers.   She states they are big bruises, purple/dark/red.   She does take ASA 81 daily.  She reports discussing with PCP and they were unsure why this is happening.  She wanted to know Dr. Victorino December opinion about this.    She also reports she has BL LE swelling that started yesterday.  She reports weight yesterday was 195.5.  she took additional 40 mg of lasix (as prescribed) and her weight is down to 190 today.   She still has swelling but states this normally takes several days to resolve.  No SOB.   She cannot wear her compression stockings, too painful to her.    Routed to MD to review.  Patient aware.

## 2021-03-17 NOTE — Telephone Encounter (Signed)
New Message:     Pt says she have several bruises on her stomach and right leg. She wants to know what Dr C thinks it what is causing it. It comes and goes. Please call, she is very concerned.

## 2021-03-23 DIAGNOSIS — R339 Retention of urine, unspecified: Secondary | ICD-10-CM | POA: Diagnosis not present

## 2021-03-27 DIAGNOSIS — Z9359 Other cystostomy status: Secondary | ICD-10-CM | POA: Diagnosis not present

## 2021-04-03 DIAGNOSIS — T148XXA Other injury of unspecified body region, initial encounter: Secondary | ICD-10-CM | POA: Diagnosis not present

## 2021-04-03 DIAGNOSIS — D696 Thrombocytopenia, unspecified: Secondary | ICD-10-CM | POA: Diagnosis not present

## 2021-04-03 LAB — CBC
Hematocrit: 46.6 % (ref 34.0–46.6)
Hemoglobin: 14.8 g/dL (ref 11.1–15.9)
MCH: 31.8 pg (ref 26.6–33.0)
MCHC: 31.8 g/dL (ref 31.5–35.7)
MCV: 100 fL — ABNORMAL HIGH (ref 79–97)
Platelets: 128 10*3/uL — ABNORMAL LOW (ref 150–450)
RBC: 4.65 x10E6/uL (ref 3.77–5.28)
RDW: 12.5 % (ref 11.7–15.4)
WBC: 4.9 10*3/uL (ref 3.4–10.8)

## 2021-04-05 ENCOUNTER — Other Ambulatory Visit: Payer: Self-pay | Admitting: *Deleted

## 2021-04-05 DIAGNOSIS — D696 Thrombocytopenia, unspecified: Secondary | ICD-10-CM

## 2021-04-07 ENCOUNTER — Telehealth: Payer: Self-pay | Admitting: Physician Assistant

## 2021-04-07 NOTE — Telephone Encounter (Signed)
Scheduled appt per referral. Pt aware.  

## 2021-04-10 DIAGNOSIS — Z466 Encounter for fitting and adjustment of urinary device: Secondary | ICD-10-CM | POA: Diagnosis not present

## 2021-04-10 DIAGNOSIS — Z9359 Other cystostomy status: Secondary | ICD-10-CM | POA: Diagnosis not present

## 2021-04-12 DIAGNOSIS — R339 Retention of urine, unspecified: Secondary | ICD-10-CM | POA: Diagnosis not present

## 2021-04-13 ENCOUNTER — Telehealth: Payer: Self-pay | Admitting: Cardiovascular Disease

## 2021-04-13 NOTE — Telephone Encounter (Signed)
Patient has suprapubic catheter in place and having leakage.  Will be having this removed and then will be doing self catheterizations so it makes sense when she begins that she will want to limit the amount of times a day she needs to self catheterize.  May benefit from a dose decrease rather than taking '20mg'$  four times a day.  Will route to Dr Sallyanne Kuster for input since he is aware of the large dose of diuretic she is currently taking.

## 2021-04-13 NOTE — Telephone Encounter (Signed)
New Message:     Patient says she have been taking 1 Furosemide pill a day. She is working on her regulating her urine  outtput. She wants to know if it is alight for her to take 1/4 of her Furosemide 4 times a day?

## 2021-04-13 NOTE — Progress Notes (Signed)
[name] Telephone:([phone]) [phone]   Fax:([phone]) [facility] NOTE  Patient Care Team: [name], [name], NP as PCP - General (Family Medicine) Croitoru, Dani Gobble, MD as PCP - Cardiology (Cardiology) Croitoru, Dani Gobble, MD as Consulting Physician (Cardiology) Druscilla Brownie, MD as Referring Physician (Dermatology) [name], DPM as Consulting Physician (Podiatry) [name], [name], [place] (Optometry) [name], MD as Referring Physician (Gynecology)  Hematological/Oncological History 1) Prior labs: -08/25/2020: WBC 6.1, Hgb 15.1, Plt 140 (L) -09/16/2020: WBC 3.9 (L), 15.5 (H), Plt 91 (L) -02/27/2021: WBC 4.7, Hgb 15.9, Plt 126 (L) -04/03/2021: WBC 4.9, Hgb 14.8, Plt 128 (L)  2) 04/14/2021: Establish care wit Dede Query PA-C  CHIEF COMPLAINTS/PURPOSE OF CONSULTATION:  "Thrombocytopenia"  HISTORY OF PRESENTING ILLNESS:  Jill Preston 87 y.o. 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 Patient is accompanied by her friend for this visit.  On exam today, Jill Preston reports chronic fatigue but can complete her daily ADLs on her own. She has a good appetite and is intentionally trying to modify her diet to loose weight. She denies any nausea, vomiting or abdominal pain. She denies any diarrhea or constipation. Patient has suprapubic catheter in place without any issues. She denies any hematuria or foul smelling urine. Patient reports easy bruising predominantly in her arms and legs, occasionally in the abdomen. She denies any signs of bleeding including hematochezia, melena, hematuria, epistaxis, hemoptysis or gingival bleeding. Patient reports bilateral lower extremity pain that has been present for approximately one month. The  pain is bothersome when her legs are at rest or when her legs are elevated. Patient has chronic lower extremity neuropathy that does affect her balance. She denies any fevers, chills, night sweats, shortness of breath, chest pain or cough. She has no other complaints. Rest of the 10 point ROS is below.   MEDICAL HISTORY:  Past Medical History:  Diagnosis Date   Arthritis    right knee   Cancer ([place]) yrs ago   skin cancer removed from face   Chronic diastolic CHF (congestive heart failure) (HCC)    Chronic venous insufficiency    LEA VENOUS, 10/17/2011 - mild reflux in bilateral common femoral veins   CKD (chronic kidney disease), stage III ([place])    Coronary artery disease    a. s/p PCI/BMS to prox LAD and balloon angioplasty to mLAD with suboptimal result in 2000. b. Abnl nuc 2012, cath 09/2011 - showed that the overall territory of potential ischemia was small and attributable to a moderately diseased small second diagonal artery. Med rx.   Dyspnea    with activity   Headache    History of blood transfusion 2017   Hyperlipidemia    Hypertension    Hypertensive heart disease    Hypothyroidism    Obesity    Pneumonia    several times   Stroke [facility])    pt. states she had "light stroke" in sept. 1980   Urinary incontinence     SURGICAL HISTORY: Past Surgical History:  Procedure Laterality Date   ABDOMINAL HYSTERECTOMY  1970's   complete   ANKLE ARTHROSCOPY Right 07/10/2017   Procedure: ANKLE ARTHROSCOPY;  Surgeon: [name], DPM;  Location: [facility];  Service: Podiatry;  Laterality: Right;   BACK SURGERY  07/26/2016   cervical neck surgery,   surgical center   BLADDER SURGERY  2010   WITH MESH  bladder tach   bunion removal surgery Bilateral 15 yrs ago   CARDIAC CATHETERIZATION Left 09/25/2011   Medical management   CARDIAC CATHETERIZATION Left 03/25/2001   Normal LV function, LAD residual narrowing of less than 10%, normal ramus intermediate, circumflex, and RCA,     CARDIAC CATHETERIZATION  09/04/1999   LAD, 3x93m Tetra stent resulting in a reduction of the 80% stenosis to 0% residual   CARDIAC CATHETERIZATION N/A 01/26/2015   Procedure: Right/Left Heart Cath and Coronary Angiography;  Surgeon: TTroy Sine MD;  [facility];  Service: Cardiovascular;  Laterality: N/A;   CARDIAC CATHETERIZATION N/A 01/27/2015   Procedure: Intravascular Pressure Wire/FFR Study;  Surgeon: CBurnell Blanks MD;  [facility];  Service: Cardiovascular;  Laterality: N/A;   CARDIAC CATHETERIZATION N/A 01/27/2015   Procedure: Right Heart Cath;  Surgeon: CBurnell Blanks MD;  [facility];  Service: Cardiovascular;  Laterality: N/A;   CHOLECYSTECTOMY     COLONOSCOPY WITH PROPOFOL N/A 03/07/2017   Procedure: COLONOSCOPY WITH PROPOFOL;  Surgeon: MJuanita Craver MD;  [facility];  Service: Endoscopy;  Laterality: N/A;   CORONARY STENT PLACEMENT     LAD x 1   ganglion cyst removal  yrs ago   x 2   ILIAC VEIN ANGIOPLASTY / STENTING  02/15/2015   INTRAVASCULAR PRESSURE WIRE/FFR STUDY N/A 04/05/2017   Procedure: Intravascular Pressure Wire/FFR Study;  Surgeon: JMartinique Peter M, MD;  [facility];  Service: Cardiovascular;  Laterality: N/A;   IVC FILTER INSERTION  2016   IVC FILTER REMOVAL  02/15/2015   [facility] N/A 04/05/2017   Procedure: Left Heart Cath and Coronary Angiography;  Surgeon: JMartinique Peter M, MD;  [facility];  Service: Cardiovascular;  Laterality: N/A;   LEFT HEART CATHETERIZATION WITH CORONARY ANGIOGRAM N/A 09/25/2011   Procedure: LEFT HEART CATHETERIZATION WITH CORONARY ANGIOGRAM;  Surgeon: MSanda Klein MD;  [facility];  Service: Cardiovascular;  Laterality: N/A;   multiple bladder surgeries to remove mesh     ROTATOR CUFF REPAIR Right    stent to groin Left 08/2014   left leg   TENDON REPAIR Right 07/10/2017    Procedure: RIGHT PERONEAL TENDON REPAIR;  Surgeon: PEvelina Bucy DPM;  [place] [facility]  Service: Podiatry;  Laterality: Right;   TENDON REPAIR Right 05/27/2019   Procedure: PERONEAL TENDON REPAIR x2;  Surgeon: PEvelina Bucy DPM;  [facility];  Service: Podiatry;  Laterality: Right;    SOCIAL HISTORY: Social History   Socioeconomic History   Marital status: Married    Spouse name: Not on file   Number of children: Not on file   Years of education: Not on file   Highest education level: Not on file  Occupational History   Not on file  Tobacco Use   Smoking status: Never   Smokeless tobacco: Never  Vaping Use   Vaping Use: Never used  Substance and Sexual Activity   Alcohol use: No   Drug use: No   Sexual activity: Not on file  Other Topics Concern   Not on file  Social History Narrative   Per [facility]ed on 03/06/2021      Diet: Left blank       Caffeine: No      Married, if  yes what year: Yes, 1955      Do you live in a house, apartment, assisted living, condo, trailer, ect: House      Is it one or more stories: 1 story      How many persons live in your home? 2      Pets: No      Highest level or education completed: 12 th grade      Current/Past profession: Futures trader       Exercise:          Yes       Type and how often: Walking          Living Will: Yes   DNR: No   POA/HPOA: Yes      Functional Status:   Do you have difficulty bathing or dressing yourself? Left Blank    Do you have difficulty preparing food or eating?Left Blank   Do you have difficulty managing your medications?Left Blank   Do you have difficulty managing your finances?Left Blank   Do you have difficulty affording your medications?Left Blank   Social Determinants of Health   Financial Resource Strain: Not on file  Food Insecurity: Not on file  Transportation Needs: Not on file  Physical Activity: Not on file  Stress: Not on file   Social Connections: Not on file  Intimate Partner Violence: Not on file    FAMILY HISTORY: Family History  Problem Relation Age of Onset   Cancer Mother    Heart disease Father    Heart disease Sister    High blood pressure Sister    Heart disease Sister    Cancer Sister    Heart disease Brother    Diabetes Brother    Brain cancer Brother    High blood pressure Brother    Heart disease Brother    Heart attack Brother    Heart disease Brother    High blood pressure Brother     ALLERGIES:  is allergic to atorvastatin, penicillins, shellfish allergy, aminophylline, iodinated diagnostic agents, latex, oxybutynin chloride, vancomycin, adhesive [tape], betadine [povidone iodine], ciprofloxacin, codeine, and ranolazine.  MEDICATIONS:  Current Outpatient Medications  Medication Sig Dispense Refill   amLODipine (NORVASC) 2.5 MG tablet Take 1 tablet (2.5 mg total) by mouth daily. 90 tablet 3   aspirin EC 81 MG tablet Take 1 tablet (81 mg total) by mouth every other day. 90 tablet 3   cholecalciferol (VITAMIN D3) 25 MCG (1000 UNIT) tablet Take 1,000 Units by mouth daily.      Docusate Sodium 100 MG capsule Take 200 mg by mouth daily at 8 pm. If no BM patient will also take 2 in the pm     ezetimibe (ZETIA) 10 MG tablet TAKE 1 TABLET(10 MG) BY MOUTH DAILY 90 tablet 3   furosemide (LASIX) 80 MG tablet Take 1 tablet (80 mg total) by mouth daily. TAKE AN EXTRA '40mg'$  (half tablet) IN THE AFTERNOON ONLY IF WEIGHT IS OVER 195lbs. 90 tablet 3   isosorbide dinitrate (ISORDIL) 30 MG tablet Take 60 mg by mouth daily at 12 noon. In the morning     KLOR-CON M20 20 MEQ tablet Take 1 tablet (20 mEq total) by mouth See admin instructions. Take one tablet (20 meq) by mouth every morning and with each afternoon dose of lasix 180 tablet 3   levothyroxine (SYNTHROID, LEVOTHROID) 112 MCG tablet Take 112 mcg by mouth daily before breakfast.   4   OVER THE COUNTER MEDICATION Take  2 tablets by mouth daily. "Focus  factor"     pravastatin (PRAVACHOL) 80 MG tablet Take 80 mg by mouth at bedtime.     No current facility-administered medications for this visit.    REVIEW OF SYSTEMS:   Constitutional: ( - ) fevers, ( - )  chills , ( - ) night sweats Eyes: ( - ) blurriness of vision, ( - ) double vision, ( - ) watery eyes Ears, nose, mouth, throat, and face: ( - ) mucositis, ( - ) sore throat Respiratory: ( - ) cough, ( - ) dyspnea, ( - ) wheezes Cardiovascular: ( - ) palpitation, ( - ) chest discomfort, ( + ) lower extremity swelling Gastrointestinal:  ( - ) nausea, ( - ) heartburn, ( - ) change in bowel habits Skin: ( - ) abnormal skin rashes Lymphatics: ( - ) new lymphadenopathy, ( + ) easy bruising Neurological: ( - ) numbness, ( - ) tingling, ( - ) new weaknesses Behavioral/Psych: ( - ) mood change, ( - ) new changes  All other systems were reviewed with the patient and are negative.  PHYSICAL EXAMINATION: ECOG PERFORMANCE STATUS: 1 - Symptomatic but completely ambulatory  Vitals:   04/14/21 1350  BP: (!) 166/61  Pulse: 76  Resp: 18  Temp: 98.3 F (36.8 C)  SpO2: 94%   Filed Weights   04/14/21 1350  Weight: 192 lb 9.6 oz (87.4 kg)    GENERAL: well appearing female in NAD  SKIN: skin color, texture, turgor are normal, no rashes or significant lesions. Mild ecchymosis noted in arms and legs.  EYES: conjunctiva are pink and non-injected, sclera clear OROPHARYNX: no exudate, no erythema; lips, buccal mucosa, and tongue normal  NECK: supple, non-tender LYMPH:  no palpable lymphadenopathy in the cervical, axillary or supraclavicular lymph nodes.  LUNGS: clear to auscultation and percussion with normal breathing effort HEART: regular rate & rhythm and no murmurs and no lower extremity edema ABDOMEN: soft, non-tender, non-distended, normal bowel sounds Musculoskeletal: no cyanosis of digits and no clubbing  PSYCH: alert & oriented x 3, fluent speech NEURO: no focal motor/sensory  deficits  LABORATORY DATA:  I have reviewed the data as listed CBC Latest Ref Rng & Units 04/14/2021 04/03/2021 02/27/2021  WBC 4.0 - 10.5 K/uL 6.1 4.9 4.7  Hemoglobin 12.0 - 15.0 g/dL 15.0 14.8 15.9  Hematocrit 36.0 - 46.0 % 45.1 46.6 48(A)  Platelets 150 - 400 K/uL 133(L) 128(L) 126(A)    CMP Latest Ref Rng & Units 04/14/2021 02/27/2021 09/16/2020  Glucose 70 - 99 mg/dL 89 - 98  BUN 8 - 23 mg/dL '17 20 15  '$ Creatinine 0.44 - 1.00 mg/dL 0.83 0.9 0.91  Sodium 135 - 145 mmol/L 143 145 138  Potassium 3.5 - 5.1 mmol/L 4.6 4.0 4.2  Chloride 98 - 111 mmol/L 102 103 99  CO2 22 - 32 mmol/L 34(H) 34(A) 29  Calcium 8.9 - 10.3 mg/dL 10.1 9.0 8.8(L)  Total Protein 6.5 - 8.1 g/dL 6.8 - -  Total Bilirubin 0.3 - 1.2 mg/dL 1.0 - -  Alkaline Phos 38 - 126 U/L 90 89 -  AST 15 - 41 U/L 41 40(A) -  ALT 0 - 44 U/L 24 38(A) -     RADIOGRAPHIC STUDIES: CT Abdomen/pelvis 10/22/2019: IMPRESSION: 1. Stable punctate nonobstructing left renal calculus. 2. Stable cirrhosis, with borderline splenomegaly likely sequela of portal venous hypertension. 3. Minimal sigmoid diverticulosis without diverticulitis.  ASSESSMENT & PLAN MAZEL IIDA is a 87 y.o. female  who presents to the clinic for evaluation for thrombocytopenia. I reviewed most recent labs from 04/03/2021 that showed mild thrombocytopenia with a platelet count of 128. I reviewed that based on patient's most recent CT imaging from February 2021, there is evidence of cirrhosis with splenomegaly secondary to portal venous hypertension. This is the underlying cause of patient's thrombocytopenia. At this time, due to mild thrombocytopenia we recommend no intervention and monitor for now.   #Thrombocytopenia: --Secondary to cirrhosis with splenomegaly seen on CT scan from 10/21/2020.  --Labs today to check CBC, CMP, PT/INR and PTT.  --No follow up visit required unless platelet count worsens.    Orders Placed This Encounter  Procedures   CBC with  Differential ([facility] Only)    Standing Status:   Future    Number of Occurrences:   1    Standing Expiration Date:   04/13/2022   Protime-INR    Standing Status:   Future    Number of Occurrences:   1    Standing Expiration Date:   04/13/2022   APTT    Standing Status:   Future    Number of Occurrences:   1    Standing Expiration Date:   04/13/2022   CMP ([facility] only)    Standing Status:   Future    Number of Occurrences:   1    Standing Expiration Date:   04/13/2022    All questions were answered. The patient knows to call the clinic with any problems, questions or concerns.  I have spent a total of 60 minutes minutes of face-to-face and non-face-to-face time, preparing to see the patient, obtaining and/or reviewing separately obtained history, performing a medically appropriate examination, counseling and educating the patient, ordering tests,  documenting clinical information in the electronic health record, and care coordination.   Dede Query, PA-C Department of Hematology/Oncology [place] at [facility] Phone: [phone]  Patient was seen with Dr. Lorenso Courier.   I have read the above note and personally examined the patient. I agree with the assessment and plan as noted above.  Briefly Jill Preston is an 87 year old female was referred for thrombocytopenia. After review of the labs and the imaging findings are most consistent with thrombocytopenia 2/2 to cirrhosis and subsequent splenomegaly. The patient has known cirrhotic morphology of the liver and her thrombocytopenia is mild. At this time no intervention is required. Her other counts show normal WBC and Hgb. There is no need for routine f/u though we can see her back is she has worsening decline in platelets.   Ledell Peoples, MD Department of Hematology/Oncology [place] at [facility] Phone: [phone] Pager: [phone] Email:  [email or web address]

## 2021-04-14 ENCOUNTER — Other Ambulatory Visit: Payer: Self-pay

## 2021-04-14 ENCOUNTER — Inpatient Hospital Stay: Payer: HMO

## 2021-04-14 ENCOUNTER — Inpatient Hospital Stay: Payer: HMO | Attending: Physician Assistant | Admitting: Physician Assistant

## 2021-04-14 VITALS — BP 166/61 | HR 76 | Temp 98.3°F | Resp 18 | Wt 192.6 lb

## 2021-04-14 DIAGNOSIS — Z86718 Personal history of other venous thrombosis and embolism: Secondary | ICD-10-CM | POA: Diagnosis not present

## 2021-04-14 DIAGNOSIS — D696 Thrombocytopenia, unspecified: Secondary | ICD-10-CM

## 2021-04-14 DIAGNOSIS — N183 Chronic kidney disease, stage 3 unspecified: Secondary | ICD-10-CM | POA: Diagnosis not present

## 2021-04-14 LAB — CBC WITH DIFFERENTIAL (CANCER CENTER ONLY)
Abs Immature Granulocytes: 0.01 10*3/uL (ref 0.00–0.07)
Basophils Absolute: 0.1 10*3/uL (ref 0.0–0.1)
Basophils Relative: 1 %
Eosinophils Absolute: 0.2 10*3/uL (ref 0.0–0.5)
Eosinophils Relative: 4 %
HCT: 45.1 % (ref 36.0–46.0)
Hemoglobin: 15 g/dL (ref 12.0–15.0)
Immature Granulocytes: 0 %
Lymphocytes Relative: 24 %
Lymphs Abs: 1.5 10*3/uL (ref 0.7–4.0)
MCH: 33.2 pg (ref 26.0–34.0)
MCHC: 33.3 g/dL (ref 30.0–36.0)
MCV: 99.8 fL (ref 80.0–100.0)
Monocytes Absolute: 0.4 10*3/uL (ref 0.1–1.0)
Monocytes Relative: 7 %
Neutro Abs: 3.9 10*3/uL (ref 1.7–7.7)
Neutrophils Relative %: 64 %
Platelet Count: 133 10*3/uL — ABNORMAL LOW (ref 150–400)
RBC: 4.52 MIL/uL (ref 3.87–5.11)
RDW: 13.6 % (ref 11.5–15.5)
WBC Count: 6.1 10*3/uL (ref 4.0–10.5)
nRBC: 0 % (ref 0.0–0.2)

## 2021-04-14 LAB — CMP (CANCER CENTER ONLY)
ALT: 24 U/L (ref 0–44)
AST: 41 U/L (ref 15–41)
Albumin: 3.6 g/dL (ref 3.5–5.0)
Alkaline Phosphatase: 90 U/L (ref 38–126)
Anion gap: 7 (ref 5–15)
BUN: 17 mg/dL (ref 8–23)
CO2: 34 mmol/L — ABNORMAL HIGH (ref 22–32)
Calcium: 10.1 mg/dL (ref 8.9–10.3)
Chloride: 102 mmol/L (ref 98–111)
Creatinine: 0.83 mg/dL (ref 0.44–1.00)
GFR, Estimated: >60 mL/min (ref >60)
Glucose, Bld: 89 mg/dL (ref 70–99)
Potassium: 4.6 mmol/L (ref 3.5–5.1)
Sodium: 143 mmol/L (ref 135–145)
Total Bilirubin: 1 mg/dL (ref 0.3–1.2)
Total Protein: 6.8 g/dL (ref 6.5–8.1)

## 2021-04-14 LAB — PROTIME-INR
INR: 1.1 (ref 0.8–1.2)
Prothrombin Time: 13.9 seconds (ref 11.4–15.2)

## 2021-04-14 LAB — APTT: aPTT: 31 seconds (ref 24–36)

## 2021-04-16 DIAGNOSIS — N183 Chronic kidney disease, stage 3 unspecified: Secondary | ICD-10-CM | POA: Diagnosis not present

## 2021-04-16 DIAGNOSIS — E782 Mixed hyperlipidemia: Secondary | ICD-10-CM | POA: Diagnosis not present

## 2021-04-16 DIAGNOSIS — I5032 Chronic diastolic (congestive) heart failure: Secondary | ICD-10-CM | POA: Diagnosis not present

## 2021-04-16 DIAGNOSIS — I130 Hypertensive heart and chronic kidney disease with heart failure and stage 1 through stage 4 chronic kidney disease, or unspecified chronic kidney disease: Secondary | ICD-10-CM | POA: Diagnosis not present

## 2021-04-17 DIAGNOSIS — D696 Thrombocytopenia, unspecified: Secondary | ICD-10-CM | POA: Insufficient documentation

## 2021-04-18 ENCOUNTER — Telehealth: Payer: Self-pay | Admitting: *Deleted

## 2021-04-18 NOTE — Telephone Encounter (Signed)
Returned the call to patient to give information per Dr March Rummage, she said that she is going to cancel her upcoming appointment  for now .Please cancel until further notice.

## 2021-04-18 NOTE — Telephone Encounter (Signed)
There aren't too many options to consider. Advise to rest, ice and we can discuss options at her appt

## 2021-04-18 NOTE — Telephone Encounter (Signed)
Patient is calling and would like doctor's advise on her right foot,still painful,swelling and the injection given did not last a day. She is wanting to know if there are any other recommendations other than injections. Please advise.

## 2021-04-19 NOTE — Telephone Encounter (Signed)
Spoke with the patient. She stated that she will be trying to retrain her  bladder when she gets her catheter out. She has been cutting her Furosemide into 4 pieces and taking them throughout the day. She was offered a smaller dosage so she did not have to cut it but declined. She stated that cutting it is working for her right now. She will go back to once a day one she gets her bladder regulated.  Her current weight was 187 pounds.   She also wanted to make Dr. Sallyanne Kuster aware that labs were drawn at the cancer center. Results are in epic.

## 2021-04-20 ENCOUNTER — Telehealth: Payer: Self-pay

## 2021-04-20 NOTE — Telephone Encounter (Signed)
Pt request her 04/14/21 progress note be faxed to her GI provider, Dr. Juanita Craver.  I have faxed this record to Dr. Collene Mares at [phone] as requested.

## 2021-04-21 ENCOUNTER — Ambulatory Visit: Payer: HMO | Admitting: Podiatry

## 2021-04-21 DIAGNOSIS — R339 Retention of urine, unspecified: Secondary | ICD-10-CM | POA: Diagnosis not present

## 2021-04-27 DIAGNOSIS — R339 Retention of urine, unspecified: Secondary | ICD-10-CM | POA: Diagnosis not present

## 2021-05-01 ENCOUNTER — Encounter: Payer: Self-pay | Admitting: Family

## 2021-05-01 DIAGNOSIS — Z1231 Encounter for screening mammogram for malignant neoplasm of breast: Secondary | ICD-10-CM | POA: Diagnosis not present

## 2021-05-04 ENCOUNTER — Telehealth: Payer: Self-pay | Admitting: Cardiovascular Disease

## 2021-05-04 DIAGNOSIS — R82998 Other abnormal findings in urine: Secondary | ICD-10-CM | POA: Diagnosis not present

## 2021-05-04 DIAGNOSIS — N3281 Overactive bladder: Secondary | ICD-10-CM | POA: Diagnosis not present

## 2021-05-04 DIAGNOSIS — R800 Isolated proteinuria: Secondary | ICD-10-CM | POA: Diagnosis not present

## 2021-05-04 NOTE — Telephone Encounter (Signed)
Spoke to patient  . Patient states she has been having  chest discomfort  the mid chest -  above breast  that  radiate below breast cross chest through to her back. Patient states sh has difficult taking deep breathe  at time it hurts so bad.   It helps if she sit down or better lays down.  She states it worsen during the day .  She states  with activity  as well   She become nauseated about 3 days ago  ( Monday) ,  had a bowel movement x 4 in period of 6 am - 12 noon.  Patient states she has been taking weight daily and blood pressure   Today's 120's /60  pulse 65  Weight 192 lb.   Patient states she has been taking regular dose of isordil 60 mg between 12 noon and 4 pm   Using lasix daily an will extra dose in weight is up.   Moved 6 month appointment up to 05/26/21 at 10:30 am  Patient aware  forward message to Dr Sallyanne Kuster  - any further  orders will contact patient

## 2021-05-04 NOTE — Telephone Encounter (Signed)
Pt c/o of Chest Pain: STAT if CP now or developed within 24 hours  1. Are you having CP right now?  Patient is having chest pressure right now   2. Are you experiencing any other symptoms (ex. SOB, nausea, vomiting, sweating)?  Pain/pressure in rib cage, shoulders, and back   3. How long have you been experiencing CP?  About 1 month, becoming progressively worse   4. Is your CP continuous or coming and going?  Continuous, patient states she has minimal relief when she lays down   5. Have you taken Nitroglycerin?  No  ?

## 2021-05-09 DIAGNOSIS — L570 Actinic keratosis: Secondary | ICD-10-CM | POA: Diagnosis not present

## 2021-05-09 DIAGNOSIS — L905 Scar conditions and fibrosis of skin: Secondary | ICD-10-CM | POA: Diagnosis not present

## 2021-05-09 DIAGNOSIS — L821 Other seborrheic keratosis: Secondary | ICD-10-CM | POA: Diagnosis not present

## 2021-05-09 DIAGNOSIS — D1801 Hemangioma of skin and subcutaneous tissue: Secondary | ICD-10-CM | POA: Diagnosis not present

## 2021-05-09 DIAGNOSIS — Z85828 Personal history of other malignant neoplasm of skin: Secondary | ICD-10-CM | POA: Diagnosis not present

## 2021-05-09 DIAGNOSIS — D485 Neoplasm of uncertain behavior of skin: Secondary | ICD-10-CM | POA: Diagnosis not present

## 2021-05-09 DIAGNOSIS — L820 Inflamed seborrheic keratosis: Secondary | ICD-10-CM | POA: Diagnosis not present

## 2021-05-09 DIAGNOSIS — L218 Other seborrheic dermatitis: Secondary | ICD-10-CM | POA: Diagnosis not present

## 2021-05-09 DIAGNOSIS — L814 Other melanin hyperpigmentation: Secondary | ICD-10-CM | POA: Diagnosis not present

## 2021-05-09 DIAGNOSIS — L304 Erythema intertrigo: Secondary | ICD-10-CM | POA: Diagnosis not present

## 2021-05-09 DIAGNOSIS — D225 Melanocytic nevi of trunk: Secondary | ICD-10-CM | POA: Diagnosis not present

## 2021-05-09 DIAGNOSIS — L728 Other follicular cysts of the skin and subcutaneous tissue: Secondary | ICD-10-CM | POA: Diagnosis not present

## 2021-05-11 DIAGNOSIS — Z86010 Personal history of colonic polyps: Secondary | ICD-10-CM | POA: Diagnosis not present

## 2021-05-11 DIAGNOSIS — K7460 Unspecified cirrhosis of liver: Secondary | ICD-10-CM | POA: Diagnosis not present

## 2021-05-11 DIAGNOSIS — D696 Thrombocytopenia, unspecified: Secondary | ICD-10-CM | POA: Diagnosis not present

## 2021-05-11 DIAGNOSIS — R748 Abnormal levels of other serum enzymes: Secondary | ICD-10-CM | POA: Diagnosis not present

## 2021-05-16 ENCOUNTER — Ambulatory Visit (INDEPENDENT_AMBULATORY_CARE_PROVIDER_SITE_OTHER): Payer: HMO | Admitting: Family Medicine

## 2021-05-16 ENCOUNTER — Ambulatory Visit: Payer: HMO | Admitting: Podiatry

## 2021-05-16 ENCOUNTER — Other Ambulatory Visit: Payer: Self-pay

## 2021-05-16 ENCOUNTER — Telehealth: Payer: Self-pay | Admitting: *Deleted

## 2021-05-16 ENCOUNTER — Encounter: Payer: Self-pay | Admitting: Family Medicine

## 2021-05-16 VITALS — BP 134/78 | HR 68 | Temp 96.8°F | Ht 65.0 in | Wt 193.4 lb

## 2021-05-16 DIAGNOSIS — J90 Pleural effusion, not elsewhere classified: Secondary | ICD-10-CM

## 2021-05-16 DIAGNOSIS — M7751 Other enthesopathy of right foot: Secondary | ICD-10-CM | POA: Diagnosis not present

## 2021-05-16 DIAGNOSIS — M19079 Primary osteoarthritis, unspecified ankle and foot: Secondary | ICD-10-CM

## 2021-05-16 DIAGNOSIS — H6522 Chronic serous otitis media, left ear: Secondary | ICD-10-CM

## 2021-05-16 DIAGNOSIS — H00019 Hordeolum externum unspecified eye, unspecified eyelid: Secondary | ICD-10-CM | POA: Insufficient documentation

## 2021-05-16 DIAGNOSIS — M25371 Other instability, right ankle: Secondary | ICD-10-CM

## 2021-05-16 DIAGNOSIS — H00012 Hordeolum externum right lower eyelid: Secondary | ICD-10-CM | POA: Diagnosis not present

## 2021-05-16 DIAGNOSIS — H6520 Chronic serous otitis media, unspecified ear: Secondary | ICD-10-CM | POA: Insufficient documentation

## 2021-05-16 MED ORDER — FLUTICASONE PROPIONATE 50 MCG/ACT NA SUSP: 2.0000 | Freq: Every day | NASAL | 6 refills | Status: DC

## 2021-05-16 MED ORDER — GENTAMICIN SULFATE 0.1 % EX OINT: 1.0000 "application " | TOPICAL_OINTMENT | Freq: Three times a day (TID) | CUTANEOUS | 0 refills | Status: DC

## 2021-05-16 MED ORDER — GENTAMICIN SULFATE 0.3 % OP OINT: 1.0000 "application " | TOPICAL_OINTMENT | Freq: Three times a day (TID) | OPHTHALMIC | 0 refills | Status: DC

## 2021-05-16 MED ORDER — BETAMETHASONE SOD PHOS & ACET 6 (3-3) MG/ML IJ SUSP: 6.0000 mg | Freq: Once | INTRAMUSCULAR | Status: DC

## 2021-05-16 NOTE — Progress Notes (Signed)
  Subjective:  Patient ID: Jill Preston, female    DOB: April 16, 1933,  MRN: [id number]  Chief Complaint  Patient presents with   Foot Pain    Follow up right foot pain. Pt states pain has increased with edema and redness.    87 y.o. female presents with the above complaint. History confirmed with patient.   Objective:  Physical Exam: warm, good capillary refill, no trophic changes or ulcerative lesions, normal DP and PT pulses and normal sensory exam.  Right Foot: POP at ATFL, peroneal tendons. No pain dorsal midfoot today.  No images are attached to the encounter.  Assessment:   1. Arthritis of midfoot   2. Ankle instability, right   3. Right ankle tendonitis    Plan:  Patient was evaluated and treated and all questions answered.  Ankle Instability with chronic ATFL tear -Injection delivered to the ATFL -Consider PT if pain persists. Patient is now poor surgical candidate.  Procedure: Injection Tendon/ligament Consent: Verbal consent obtained. Location: Right ATFL  Skin Prep: Alcohol. Injectate: 1 cc 0.5% marcaine plain, 1 cc betamethasone acetate-betamethasone sodium phosphate Disposition: Patient tolerated procedure well. Injection site dressed with a band-aid.   Return in about 1 month (around 06/16/2021) for Tendonitis.

## 2021-05-16 NOTE — Telephone Encounter (Signed)
[facility] called and stated that they received a Rx for Gentamycin ointment but patient needs the Gentamycin Eye Ointment instead.   Pended Rx and sent to Dr. [name] Heck for approval.

## 2021-05-16 NOTE — Progress Notes (Signed)
Provider:  Alain Honey, MD  Careteam: Patient Care Team: [name], [name], NP as PCP - General (Family Medicine) Croitoru, Dani Gobble, MD as PCP - Cardiology (Cardiology) Sanda Klein, MD as Consulting Physician (Cardiology) Druscilla Brownie, MD as Referring Physician (Dermatology) [name], DPM as Consulting Physician (Podiatry) [name], [name], [place] (Optometry) [name], MD as Referring Physician (Gynecology)  PLACE OF SERVICE:  [place]  Advanced Directive information    Allergies  Allergen Reactions   Atorvastatin Anaphylaxis    Patient does not remember; caused pneumonia- took her off of it per patient.    Penicillins Anaphylaxis, Hives and Swelling    Tongue swelling Did it involve swelling of the face/tongue/throat, SOB, or low BP? Yes Did it involve sudden or severe rash/hives, skin peeling, or any reaction on the inside of your mouth or nose? Yes Did you need to seek medical attention at a hospital or doctor's office? Yes When did it last happen?  77 or 87 year old    If all above answers are "NO", may proceed with cephalosporin use.    Shellfish Allergy Anaphylaxis and Nausea And Vomiting    Severe nausea and vomiting   Aminophylline Itching, Swelling and Rash    Pt experienced burning urination, itching, redness/rash, and swelling of genitals after given this medication via IV push.   Iodinated Diagnostic Agents Swelling and Other (See Comments)    SWELLING REACTION UNSPECIFIED  fFLUSHING   Latex Other (See Comments)    Causes blisters   Oxybutynin Chloride Other (See Comments)    "blisters"   Vancomycin Hives    06/02/2018- Hives arm, back and chest   Adhesive [Tape] Rash    Paper tape is ok   Betadine [Povidone Iodine] Rash   Ciprofloxacin Hives   Codeine Nausea And Vomiting    .   Ranolazine Other (See Comments)    Constipation  .    Chief Complaint  Patient presents with   Acute Visit    Patient presents today for eye  check. She reports a a bump appeared on her right eye 2 weeks ago and seems to be getting worse. She reports blurry vision, change in size and painful.     HPI: Patient is a 87 y.o. female.  2-week history of a bump on her right lower eyelid.  She has been using warm compresses but now it seems to be getting larger and affecting her vision.  She also complains of some dizziness that has a positional component.  Seems to be worse when she is lying in bed at night and she rolls over but also present sometimes during the day  Review of Systems:  Review of Systems  Eyes:  Positive for blurred vision and pain.  Neurological:  Positive for dizziness.  All other systems reviewed and are negative.  Past Medical History:  Diagnosis Date   Arthritis    right knee   Cancer ([place]) yrs ago   skin cancer removed from face   Chronic diastolic CHF (congestive heart failure) (HCC)    Chronic venous insufficiency    LEA VENOUS, 10/17/2011 - mild reflux in bilateral common femoral veins   CKD (chronic kidney disease), stage III ([place])    Coronary artery disease    a. s/p PCI/BMS to prox LAD and balloon angioplasty to mLAD with suboptimal result in 2000. b. Abnl nuc 2012, cath 09/2011 - showed that the overall territory of potential ischemia was small and attributable to a moderately  diseased small second diagonal artery. Med rx.   Dyspnea    with activity   Headache    History of blood transfusion 2017   Hyperlipidemia    Hypertension    Hypertensive heart disease    Hypothyroidism    Obesity    Pneumonia    several times   Stroke [facility])    pt. states she had "light stroke" in sept. 1980   Urinary incontinence    Past Surgical History:  Procedure Laterality Date   ABDOMINAL HYSTERECTOMY  1970's   complete   ANKLE ARTHROSCOPY Right 07/10/2017   Procedure: ANKLE ARTHROSCOPY;  Surgeon: [name], DPM;  Location: [place];  Service: Podiatry;  Laterality: Right;   BACK SURGERY  07/26/2016    cervical neck surgery, [facility]   BLADDER SURGERY  2010   WITH MESH  bladder tach   bunion removal surgery Bilateral 15 yrs ago   CARDIAC CATHETERIZATION Left 09/25/2011   Medical management   CARDIAC CATHETERIZATION Left 03/25/2001   Normal LV function, LAD residual narrowing of less than 10%, normal ramus intermediate, circumflex, and RCA,    CARDIAC CATHETERIZATION  09/04/1999   LAD, 3x7m Tetra stent resulting in a reduction of the 80% stenosis to 0% residual   CARDIAC CATHETERIZATION N/A 01/26/2015   Procedure: Right/Left Heart Cath and Coronary Angiography;  Surgeon: TTroy Sine MD;  [place] [facility];  Service: Cardiovascular;  Laterality: N/A;   CARDIAC CATHETERIZATION N/A 01/27/2015   Procedure: Intravascular Pressure Wire/FFR Study;  Surgeon: CBurnell Blanks MD;  [facility];  Service: Cardiovascular;  Laterality: N/A;   CARDIAC CATHETERIZATION N/A 01/27/2015   Procedure: Right Heart Cath;  Surgeon: CBurnell Blanks MD;  [facility];  Service: Cardiovascular;  Laterality: N/A;   CHOLECYSTECTOMY     COLONOSCOPY WITH PROPOFOL N/A 03/07/2017   Procedure: COLONOSCOPY WITH PROPOFOL;  Surgeon: MJuanita Craver MD;  [facility];  Service: Endoscopy;  Laterality: N/A;   CORONARY STENT PLACEMENT     LAD x 1   ganglion cyst removal  yrs ago   x 2   ILIAC VEIN ANGIOPLASTY / STENTING  02/15/2015   INTRAVASCULAR PRESSURE WIRE/FFR STUDY N/A 04/05/2017   Procedure: Intravascular Pressure Wire/FFR Study;  Surgeon: JMartinique Peter M, MD;  [facility];  Service: Cardiovascular;  Laterality: N/A;   IVC FILTER INSERTION  2016   IVC FILTER REMOVAL  02/15/2015   [facility] N/A 04/05/2017   Procedure: Left Heart Cath and Coronary Angiography;  Surgeon: JMartinique Peter M, MD;  [place] [facility];  Service: Cardiovascular;  Laterality: N/A;   LEFT HEART  CATHETERIZATION WITH CORONARY ANGIOGRAM N/A 09/25/2011   Procedure: LEFT HEART CATHETERIZATION WITH CORONARY ANGIOGRAM;  Surgeon: MSanda Klein MD;  [facility];  Service: Cardiovascular;  Laterality: N/A;   multiple bladder surgeries to remove mesh     ROTATOR CUFF REPAIR Right    stent to groin Left 08/2014   left leg   TENDON REPAIR Right 07/10/2017   Procedure: RIGHT PERONEAL TENDON REPAIR;  Surgeon: PEvelina Bucy DPM;  [place]  Service: Podiatry;  Laterality: Right;   TENDON REPAIR Right 05/27/2019   Procedure: PERONEAL TENDON REPAIR x2;  Surgeon: PEvelina Bucy DPM;  [facility];  Service: Podiatry;  Laterality: Right;   Social History:   reports that she has never smoked. She  has never used smokeless tobacco. She reports that she does not drink alcohol and does not use drugs.  Family History  Problem Relation Age of Onset   Cancer Mother    Heart disease Father    Heart disease Sister    High blood pressure Sister    Heart disease Sister    Cancer Sister    Heart disease Brother    Diabetes Brother    Brain cancer Brother    High blood pressure Brother    Heart disease Brother    Heart attack Brother    Heart disease Brother    High blood pressure Brother     Medications: Patient's Medications  New Prescriptions   GENTAMICIN OINTMENT (GARAMYCIN) 0.1 %    Apply 1 application topically 3 (three) times daily.  Previous Medications   AMLODIPINE (NORVASC) 2.5 MG TABLET    Take 1 tablet (2.5 mg total) by mouth daily.   ASPIRIN EC 81 MG TABLET    Take 1 tablet (81 mg total) by mouth every other day.   CHOLECALCIFEROL (VITAMIN D3) 25 MCG (1000 UNIT) TABLET    Take 1,000 Units by mouth daily.    DOCUSATE SODIUM 100 MG CAPSULE    Take 200 mg by mouth daily at 8 pm. If no BM patient will also take 2 in the pm   EZETIMIBE (ZETIA) 10 MG TABLET    TAKE 1 TABLET(10 MG) BY MOUTH DAILY   FUROSEMIDE (LASIX) 80 MG TABLET    Take 1 tablet (80 mg total) by  mouth daily. TAKE AN EXTRA '40mg'$  (half tablet) IN THE AFTERNOON ONLY IF WEIGHT IS OVER 195lbs.   ISOSORBIDE DINITRATE (ISORDIL) 30 MG TABLET    Take 60 mg by mouth daily at 12 noon. In the morning   KLOR-CON M20 20 MEQ TABLET    Take 1 tablet (20 mEq total) by mouth See admin instructions. Take one tablet (20 meq) by mouth every morning and with each afternoon dose of lasix   LEVOTHYROXINE (SYNTHROID, LEVOTHROID) 112 MCG TABLET    Take 112 mcg by mouth daily before breakfast.    OVER THE COUNTER MEDICATION    Take 2 tablets by mouth daily. "Focus factor"   PRAVASTATIN (PRAVACHOL) 80 MG TABLET    Take 80 mg by mouth at bedtime.  Modified Medications   No medications on file  Discontinued Medications   No medications on file    Physical Exam:  Vitals:   05/16/21 0952  BP: 134/78  Pulse: 68  Temp: (!) 96.8 F (36 C)  TempSrc: Temporal  SpO2: 96%  Weight: 193 lb 6.4 oz (87.7 kg)  Height: '5\' 5"'$  (1.651 m)   Body mass index is 32.18 kg/m. Wt Readings from Last 3 Encounters:  05/16/21 193 lb 6.4 oz (87.7 kg)  04/14/21 192 lb 9.6 oz (87.4 kg)  03/07/21 192 lb (87.1 kg)    Physical Exam Vitals and nursing note reviewed.  Constitutional:      Appearance: Normal appearance.  HENT:     Ears:     Comments: Left tympanic membrane is dull without normal light reflex consistent with serous effusion Eyes:     Extraocular Movements: Extraocular movements intact.     Pupils: Pupils are equal, round, and reactive to light.     Comments: Patient has a significant stye on right lower lid.  There is both external as well as internal component.  I suspect the inward swelling is pressing on her eye and affecting  vision.  There is some scabbing on the external component.  It is red and tender to palpation  Cardiovascular:     Rate and Rhythm: Normal rate.  Pulmonary:     Effort: Pulmonary effort is normal.  Neurological:     Mental Status: She is alert.    Labs reviewed: Basic Metabolic  Panel: Recent Labs    08/25/20 0845 09/16/20 1747 02/27/21 0000 04/14/21 1541  NA 140 138 145 143  K 3.1* 4.2 4.0 4.6  CL 93* 99 103 102  CO2 28 29 34* 34*  GLUCOSE 103* 98  --  89  BUN '24 15 20 17  '$ CREATININE 0.82 0.91 0.9 0.83  CALCIUM 9.4 8.8* 9.0 10.1  TSH  --   --  1.92  --    Liver Function Tests: Recent Labs    02/27/21 0000 04/14/21 1541  AST 40* 41  ALT 38* 24  ALKPHOS 89 90  BILITOT  --  1.0  PROT  --  6.8  ALBUMIN 3.8 3.6   No results for input(s): LIPASE, AMYLASE in the last 8760 hours. No results for input(s): AMMONIA in the last 8760 hours. CBC: Recent Labs    09/16/20 1747 02/27/21 0000 04/03/21 1233 04/14/21 1541  WBC 3.9* 4.7 4.9 6.1  NEUTROABS 1.9 2.90  --  3.9  HGB 15.5* 15.9 14.8 15.0  HCT 48.0* 48* 46.6 45.1  MCV 100.0  --  100* 99.8  PLT 91* 126* 128* 133*   Lipid Panel: Recent Labs    02/27/21 0000  CHOL 155  HDL 85*  LDLCALC 59  TRIG 57   TSH: Recent Labs    02/27/21 0000  TSH 1.92   A1C: Lab Results  Component Value Date   HGBA1C 6.1 (H) 01/26/2015     Assessment/Plan  1. Hordeolum externum of right lower eyelid Use ointment as well as warm compresses.  If symptoms are not improved after 1 week refer to ophthalmologist for possible I&D - gentamicin ointment (GARAMYCIN) 0.1 %; Apply 1 application topically 3 (three) times daily.  Dispense: 15 g; Refill: 0 - gentamicin ointment (GARAMYCIN) 0.1 %; Apply 1 application topically 3 (three) times daily.  Dispense: 15 g; Refill: 0    3. Left chronic serous otitis media Use Flonase once a day   Alain Honey, MD [place] [phone]

## 2021-05-16 NOTE — Patient Instructions (Signed)
Use warm compresses 4 times a day and antibiotic ointment afterwardHow to Perform the Epley Maneuver The Epley maneuver is an exercise that relieves symptoms of vertigo. Vertigo is the feeling that you or your surroundings are moving when they are not. When you feel vertigo, you may feel like the room is spinning and may have trouble walking. The Epley maneuver is used for a type of vertigo caused by a calcium deposit in a part of the inner ear. The maneuver involves changing headpositions to help the deposit move out of the area. You can do this maneuver at home whenever you have symptoms of vertigo. You canrepeat it in 24 hours if your vertigo has not gone away. Even though the Epley maneuver may relieve your vertigo for a few weeks, it is possible that your symptoms will return. This maneuver relieves vertigo, but itdoes not relieve dizziness. What are the risks? If it is done correctly, the Epley maneuver is considered safe. Sometimes it can lead to dizziness or nausea that goes away after a short time. If you develop other symptoms--such as changes in vision, weakness, or numbness--stopdoing the maneuver and call your health care provider. Supplies needed: A bed or table. A pillow. How to do the Epley maneuver     Sit on the edge of a bed or table with your back straight and your legs extended or hanging over the edge of the bed or table. Turn your head halfway toward the affected ear or side as told by your health care provider. Lie backward quickly with your head turned until you are lying flat on your back. Your head should dangle (head-hanging position). You may want to position a pillow under your shoulders. Hold this position for at least 30 seconds. If you feel dizzy or have symptoms of vertigo, continue to hold the position until the symptoms stop. Turn your head to the opposite direction until your unaffected ear is facing down. Your head should continue to dangle. Hold this position  for at least 30 seconds. If you feel dizzy or have symptoms of vertigo, continue to hold the position until the symptoms stop. Turn your whole body to the same side as your head so that you are positioned on your side. Your head will now be nearly facedown and no longer needs to dangle. Hold for at least 30 seconds. If you feel dizzy or have symptoms of vertigo, continue to hold the position until the symptoms stop. Sit back up. You can repeat the maneuver in 24 hours if your vertigo does not go away. Follow these instructions at home: For 24 hours after doing the Epley maneuver: Keep your head in an upright position. When lying down to sleep or rest, keep your head raised (elevated) with two or more pillows. Avoid excessive neck movements. Activity Do not drive or use machinery if you feel dizzy. After doing the Epley maneuver, return to your normal activities as told by your health care provider. Ask your health care provider what activities are safe for you. General instructions Drink enough fluid to keep your urine pale yellow. Do not drink alcohol. Take over-the-counter and prescription medicines only as told by your health care provider. Keep all follow-up visits. This is important. Preventing vertigo symptoms Ask your health care provider if there is anything you should do at home to prevent vertigo. He or she may recommend that you: Keep your head elevated with two or more pillows while you sleep. Do not sleep on the  side of your affected ear. Get up slowly from bed. Avoid sudden movements during the day. Avoid extreme head positions or movement, such as looking up or bending over. Contact a health care provider if: Your vertigo gets worse. You have other symptoms, including: Nausea. Vomiting. Headache. Get help right away if you: Have vision changes. Have a headache or neck pain that is severe or getting worse. Cannot stop vomiting. Have new numbness or weakness in any part  of your body. These symptoms may represent a serious problem that is an emergency. Do not wait to see if the symptoms will go away. Get medical help right away. Call your local emergency services (911 in the [place] Do not drive yourself to the hospital. Summary Vertigo is the feeling that you or your surroundings are moving when they are not. The Epley maneuver is an exercise that relieves symptoms of vertigo. If the Epley maneuver is done correctly, it is considered safe. This information is not intended to replace advice given to you by your health care provider. Make sure you discuss any questions you have with your healthcare provider. Document Revised: 08/03/2020 Document Reviewed: 08/03/2020 Elsevier Patient Education  2022 [employer].

## 2021-05-17 DIAGNOSIS — I5032 Chronic diastolic (congestive) heart failure: Secondary | ICD-10-CM | POA: Diagnosis not present

## 2021-05-17 DIAGNOSIS — I130 Hypertensive heart and chronic kidney disease with heart failure and stage 1 through stage 4 chronic kidney disease, or unspecified chronic kidney disease: Secondary | ICD-10-CM | POA: Diagnosis not present

## 2021-05-17 DIAGNOSIS — E782 Mixed hyperlipidemia: Secondary | ICD-10-CM | POA: Diagnosis not present

## 2021-05-17 DIAGNOSIS — N183 Chronic kidney disease, stage 3 unspecified: Secondary | ICD-10-CM | POA: Diagnosis not present

## 2021-05-19 MED ORDER — BACITRACIN-POLYMYXIN B 500-10000 UNIT/GM OP OINT: 1.0000 "application " | TOPICAL_OINTMENT | Freq: Two times a day (BID) | OPHTHALMIC | 0 refills | Status: DC

## 2021-05-19 NOTE — Telephone Encounter (Signed)
New Rx sent to pharmacy

## 2021-05-19 NOTE — Addendum Note (Signed)
Addended by: Lauree Chandler on: 05/19/2021 01:14 PM   Modules accepted: Orders

## 2021-05-19 NOTE — Telephone Encounter (Signed)
Katrina with [facility] called and stated that the Gentamicin Eye Ointment is on back order.   I asked them for an alternative that they do have in stock and they stated that all the medications in that class are on back order and I will have to check with the Provider for an alternative from different class.   Please Advise. (Forwarded to [place] due to Dr. [name] Heck out of office)

## 2021-05-24 ENCOUNTER — Other Ambulatory Visit: Payer: Self-pay | Admitting: Gastroenterology

## 2021-05-24 DIAGNOSIS — Z8719 Personal history of other diseases of the digestive system: Secondary | ICD-10-CM

## 2021-05-26 ENCOUNTER — Ambulatory Visit: Payer: HMO | Admitting: Cardiovascular Disease

## 2021-05-26 ENCOUNTER — Other Ambulatory Visit: Payer: Self-pay

## 2021-05-26 VITALS — BP 116/58 | HR 68 | Ht 65.0 in | Wt 183.0 lb

## 2021-05-26 DIAGNOSIS — I5032 Chronic diastolic (congestive) heart failure: Secondary | ICD-10-CM | POA: Diagnosis not present

## 2021-05-26 DIAGNOSIS — I25118 Atherosclerotic heart disease of native coronary artery with other forms of angina pectoris: Secondary | ICD-10-CM | POA: Diagnosis not present

## 2021-05-26 DIAGNOSIS — I739 Peripheral vascular disease, unspecified: Secondary | ICD-10-CM | POA: Diagnosis not present

## 2021-05-26 DIAGNOSIS — I10 Essential (primary) hypertension: Secondary | ICD-10-CM | POA: Diagnosis not present

## 2021-05-26 DIAGNOSIS — E669 Obesity, unspecified: Secondary | ICD-10-CM | POA: Diagnosis not present

## 2021-05-26 DIAGNOSIS — E7800 Pure hypercholesterolemia, unspecified: Secondary | ICD-10-CM

## 2021-05-26 MED ORDER — FUROSEMIDE 80 MG PO TABS: 80.0000 mg | ORAL_TABLET | Freq: Every day | ORAL | 3 refills | Status: DC

## 2021-05-26 NOTE — Patient Instructions (Addendum)
Medication Instructions:  Furoesmide: Take 80 mg once daily. If your weight is over 185 pounds then you may take 40 mg in the afternoon (half a tablet) *If you need a refill on your cardiac medications before your next appointment, please call your pharmacy*   Lab Work: None ordered If you have labs (blood work) drawn today and your tests are completely normal, you will receive your results only by: [place] (if you have MyChart) OR A paper copy in the mail If you have any lab test that is abnormal or we need to change your treatment, we will call you to review the results.   Testing/Procedures: None ordered   Follow-Up: At [facility], you and your health needs are our priority.  As part of our continuing mission to provide you with exceptional heart care, we have created designated Provider Care Teams.  These Care Teams include your primary Cardiologist (physician) and Advanced Practice Providers (APPs -  Physician Assistants and Nurse Practitioners) who all work together to provide you with the care you need, when you need it.  We recommend signing up for the patient portal called "MyChart".  Sign up information is provided on this After Visit Summary.  MyChart is used to connect with patients for Virtual Visits (Telemedicine).  Patients are able to view lab/test results, encounter notes, upcoming appointments, etc.  Non-urgent messages can be sent to your provider as well.   To learn more about what you can do with MyChart, go to [email or web address].    Your next appointment:   6 month(s)  The format for your next appointment:   In Person  Provider:   You may see Sanda Klein, MD or one of the following Advanced Practice Providers on your designated Care Team:   Almyra Deforest, PA-C Fabian Sharp, PA-C or  Roby Lofts, [place]

## 2021-05-26 NOTE — Progress Notes (Signed)
Patient ID: Jill Preston, female   DOB: 20-May-1933, 87 y.o.   MRN: [id number] Patient ID: Jill Preston, female   DOB: [birth year], 87 y.o.   MRN: [id number]    Cardiology Office Note    Date:  05/26/2021   ID:  Jill Preston, DOB 02-02-1933, MRN [id number]  PCP:  Sandrea Hughs, NP  Cardiologist:   Sanda Klein, MD   chief complaint: CHF  History of Present Illness:  Jill Preston is a 87 y.o. female returning in follow-up for CAD and CHF.  Known moderate stenosis of the left main coronary artery, and found to not be hemodynamically significant by FFR analysis in 2016 and again in 2018.  She has chronic diastolic heart failure that responds well to diuretics  Had some problems with excess fluid, increased weight and edema that resolved after taking an extra dose of furosemide 40 mg early this morning.  She lost from 192 pounds to 186 on her home scale and just 1 morning.  On our office scale today she weighs 183 pounds.  It is apparent that she has lost true weight over the last year.  She still has 1+ ankle edema, but there is some wrinkling in the skin of her pretibial area and this is "the least swollen she has been in a year".  Her biggest complaint remains easy fatigue and chest pressure when she tries to do housework.  She does not have dyspnea at rest and denies orthopnea or PND.  She has successfully weaned herself from her suprapubic cystostomy catheter.  The biggest limitation to her quality of life remains her husband's worsening dementia.  He was recently hospitalized with urosepsis to make the situation even more difficult.  He is up and down all night long, which limits her ability to get any good sleep.  He has been having a lot of falls.  She brought him to the office today.  She chronically sleeps with the head of her adjustable bed elevated at a significant angle, about 30 degrees  Most recent labs from just about 6 weeks ago show a creatinine of 0.83 and  a potassium of 4.6.  She has a history of acute kidney injury with severe hypokalemia with excessive diuresis, especially when we used metolazone in the past.    Her lipid profile is excellent with an LDL of 59 and an HDL of 85.  Her baseline BNP is around 60-80 and it has been as high as 232 when she is in heart failure.  Normal lower extremity venous Dopplers in January 2021.  She has a long history of coronary disease. She underwent proximal LAD bare-metal stenting and balloon angioplasty of the mid LAD in 2013. She has had persistent ischemia in the territory of the diagonal artery on nuclear stress test performed over the last few years. Coronary angiography performed May 2016 showed a widely patent LAD stent, 60% ostial circumflex stenosis, 20-30% right coronary artery stenosis. There was concern about possible left coronary artery stenosis but fractional flow reserve was normal (0.96 at baseline, 0.91 during intravenous adenosine infusion). She had good anginal response to Ranexa but developed severe constipation and could not tolerate the medication. She has been intolerant to beta blockers due to bradycardia. She has preserved left ventricular systolic function but has had episodes of acute exacerbation of diastolic heart failure attributed to hypertensive heart disease. December 2015, she was critically ill with an acute left iliofemoral DVT with anticoagulation complicated by retroperitoneal hematoma  and hemorrhagic shock, requiring placement of an inferior vena cava filter. The filter was removed in June 2016. Coronary angiography in July 2018 showed unchanged anatomy of the eccentric stenosis in the left main coronary artery with noncritical FFR (0.89 in the LAD, 0.86 in the LCx).  Intravascular Pressure Wire/FFR Study  Left Heart Cath and Coronary Angiography  Conclusion     LM lesion, 70 %stenosed. Mid LAD lesion, 0 %stenosed at site of prior stent. Ost Cx lesion, 60 %stenosed. Prox  RCA-1 lesion, 20 %stenosed. Prox RCA-2 lesion, 30 %stenosed. The left ventricular systolic function is normal. LV end diastolic pressure is normal. The left ventricular ejection fraction is 55-65% by visual estimate.   1. 70% eccentric distal left main stenosis. Angiographically similar to 2016. FFR 0.89 into the LAD and 0.86 into the LCx suggesting that this lesion is not flow limiting.  2. Otherwise nonobstructive CAD. Patent stent in LAD. 3. Normal LV function 4. Normal LVEDP   Plan: recommend continued medical therapy.     Past Medical History:  Diagnosis Date   Arthritis    right knee   Cancer ([place]) yrs ago   skin cancer removed from face   Chronic diastolic CHF (congestive heart failure) (HCC)    Chronic venous insufficiency    LEA VENOUS, 10/17/2011 - mild reflux in bilateral common femoral veins   CKD (chronic kidney disease), stage III ([place])    Coronary artery disease    a. s/p PCI/BMS to prox LAD and balloon angioplasty to mLAD with suboptimal result in 2000. b. Abnl nuc 2012, cath 09/2011 - showed that the overall territory of potential ischemia was small and attributable to a moderately diseased small second diagonal artery. Med rx.   Dyspnea    with activity   Headache    History of blood transfusion 2017   Hyperlipidemia    Hypertension    Hypertensive heart disease    Hypothyroidism    Obesity    Pneumonia    several times   Stroke [facility])    pt. states she had "light stroke" in sept. 1980   Urinary incontinence     Past Surgical History:  Procedure Laterality Date   ABDOMINAL HYSTERECTOMY  1970's   complete   ANKLE ARTHROSCOPY Right 07/10/2017   Procedure: ANKLE ARTHROSCOPY;  Surgeon: [name], DPM;  Location: [place];  Service: Podiatry;  Laterality: Right;   BACK SURGERY  07/26/2016   cervical neck surgery, [facility]   BLADDER SURGERY  2010   WITH MESH  bladder tach   bunion removal surgery Bilateral 15 yrs ago   CARDIAC  CATHETERIZATION Left 09/25/2011   Medical management   CARDIAC CATHETERIZATION Left 03/25/2001   Normal LV function, LAD residual narrowing of less than 10%, normal ramus intermediate, circumflex, and RCA,    CARDIAC CATHETERIZATION  09/04/1999   LAD, 3x63m Tetra stent resulting in a reduction of the 80% stenosis to 0% residual   CARDIAC CATHETERIZATION N/A 01/26/2015   Procedure: Right/Left Heart Cath and Coronary Angiography;  Surgeon: TTroy Sine MD;  [place] [facility];  Service: Cardiovascular;  Laterality: N/A;   CARDIAC CATHETERIZATION N/A 01/27/2015   Procedure: Intravascular Pressure Wire/FFR Study;  Surgeon: CBurnell Blanks MD;  [place];  Service: Cardiovascular;  Laterality: N/A;   CARDIAC CATHETERIZATION N/A 01/27/2015   Procedure: Right Heart Cath;  Surgeon: CBurnell Blanks MD;  [place] [facility];  Service: Cardiovascular;  Laterality: N/A;  CHOLECYSTECTOMY     COLONOSCOPY WITH PROPOFOL N/A 03/07/2017   Procedure: COLONOSCOPY WITH PROPOFOL;  Surgeon: Juanita Craver, MD;  Location[facility];  Service: Endoscopy;  Laterality: N/A;   CORONARY STENT PLACEMENT     LAD x 1   ganglion cyst removal  yrs ago   x 2   ILIAC VEIN ANGIOPLASTY / STENTING  02/15/2015   INTRAVASCULAR PRESSURE WIRE/FFR STUDY N/A 04/05/2017   Procedure: Intravascular Pressure Wire/FFR Study;  Surgeon: Martinique, Peter M, MD;  Location: [place] [facility];  Service: Cardiovascular;  Laterality: N/A;   IVC FILTER INSERTION  2016   IVC FILTER REMOVAL  02/15/2015   at [place] CATH AND CORONARY ANGIOGRAPHY N/A 04/05/2017   Procedure: Left Heart Cath and Coronary Angiography;  Surgeon: Martinique, Peter M, MD;  Location: [place] [facility];  Service: Cardiovascular;  Laterality: N/A;   LEFT HEART CATHETERIZATION WITH CORONARY ANGIOGRAM N/A 09/25/2011   Procedure: LEFT HEART CATHETERIZATION WITH CORONARY ANGIOGRAM;  Surgeon: Sanda Klein, MD;  Location: [place]  CATH LAB;  Service: Cardiovascular;  Laterality: N/A;   multiple bladder surgeries to remove mesh     ROTATOR CUFF REPAIR Right    stent to groin Left 08/2014   left leg   TENDON REPAIR Right 07/10/2017   Procedure: RIGHT PERONEAL TENDON REPAIR;  Surgeon: [name], DPM;  Location: [place];  Service: Podiatry;  Laterality: Right;   TENDON REPAIR Right 05/27/2019   Procedure: PERONEAL TENDON REPAIR x2;  Surgeon: [name], DPM;  Location[facility];  Service: Podiatry;  Laterality: Right;    Outpatient Medications Prior to Visit  Medication Sig Dispense Refill   amLODipine (NORVASC) 2.5 MG tablet Take 1 tablet (2.5 mg total) by mouth daily. 90 tablet 3   aspirin EC 81 MG tablet Take 1 tablet (81 mg total) by mouth every other day. 90 tablet 3   bacitracin-polymyxin b (POLYSPORIN) ophthalmic ointment Place 1 application into the right eye every 12 (twelve) hours. 3.5 g 0   cholecalciferol (VITAMIN D3) 25 MCG (1000 UNIT) tablet Take 1,000 Units by mouth daily.      Docusate Sodium 100 MG capsule Take 200 mg by mouth daily at 8 pm. If no BM patient will also take 2 in the pm     ezetimibe (ZETIA) 10 MG tablet TAKE 1 TABLET(10 MG) BY MOUTH DAILY 90 tablet 3   fluticasone (FLONASE) 50 MCG/ACT nasal spray Place 2 sprays into both nostrils daily. 16 g 6   gentamicin ointment (GARAMYCIN) 0.1 % Apply 1 application topically 3 (three) times daily. 15 g 0   gentamicin ointment (GARAMYCIN) 0.1 % Apply 1 application topically 3 (three) times daily. 15 g 0   isosorbide dinitrate (ISORDIL) 30 MG tablet Take 60 mg by mouth daily at 12 noon. In the morning     KLOR-CON M20 20 MEQ tablet Take 1 tablet (20 mEq total) by mouth See admin instructions. Take one tablet (20 meq) by mouth every morning and with each afternoon dose of lasix 180 tablet 3   levothyroxine (SYNTHROID, LEVOTHROID) 112 MCG tablet Take 112 mcg by mouth daily before breakfast.   4   OVER THE COUNTER MEDICATION Take 2 tablets by mouth  daily. "Focus factor"     pravastatin (PRAVACHOL) 80 MG tablet Take 80 mg by mouth at bedtime.     furosemide (LASIX) 80 MG tablet Take 1 tablet (80 mg total) by mouth daily. TAKE AN EXTRA '40mg'$  (half tablet)  IN THE AFTERNOON ONLY IF WEIGHT IS OVER 195lbs. 90 tablet 3   Facility-Administered Medications Prior to Visit  Medication Dose Route Frequency Provider Last Rate Last Admin   betamethasone acetate-betamethasone sodium phosphate (CELESTONE) injection 6 mg  6 mg Other Once [name], DPM         Allergies:   Atorvastatin, Penicillins, Shellfish allergy, Aminophylline, Iodinated diagnostic agents, Latex, Oxybutynin chloride, Vancomycin, Adhesive [tape], Betadine [povidone iodine], Ciprofloxacin, Codeine, and Ranolazine   Social History   Socioeconomic History   Marital status: Married    Spouse name: Not on file   Number of children: Not on file   Years of education: Not on file   Highest education level: Not on file  Occupational History   Not on file  Tobacco Use   Smoking status: Never   Smokeless tobacco: Never  Vaping Use   Vaping Use: Never used  Substance and Sexual Activity   Alcohol use: No   Drug use: No   Sexual activity: Not on file  Other Topics Concern   Not on file  Social History Narrative   Per [facility] New Patient Packet Abstracted on 03/06/2021      Diet: Left blank       Caffeine: No      Married, if yes what year: Yes, 1955      Do you live in a house, apartment, assisted living, condo, trailer, ect: House      Is it one or more stories: 1 story      How many persons live in your home? 2      Pets: No      Highest level or education completed: 12 th grade      Current/Past profession: Futures trader       Exercise:          Yes       Type and how often: Walking          Living Will: Yes   DNR: No   POA/HPOA: Yes      Functional Status:   Do you have difficulty bathing or dressing yourself? Left Blank    Do you have  difficulty preparing food or eating?Left Blank   Do you have difficulty managing your medications?Left Blank   Do you have difficulty managing your finances?Left Blank   Do you have difficulty affording your medications?Left Blank   Social Determinants of Health   Financial Resource Strain: Not on file  Food Insecurity: Not on file  Transportation Needs: Not on file  Physical Activity: Not on file  Stress: Not on file  Social Connections: Not on file     Family History:  The patient's family history includes Brain cancer in her brother; Cancer in her mother and sister; Diabetes in her brother; Heart attack in her brother; Heart disease in her brother, brother, brother, father, sister, and sister; High blood pressure in her brother, brother, and sister.   ROS:   Please see the history of present illness.    ROS All other systems are reviewed and are negative.   PHYSICAL EXAM:   VS:  BP (!) 116/58 (BP Location: Right Arm, Patient Position: Sitting, Cuff Size: Large)   Pulse 68   Ht '5\' 5"'$  (1.651 m)   Wt 183 lb (83 kg)   BMI 30.45 kg/m      General: Alert, oriented x3, no distress, borderline obese Head: no evidence of trauma, PERRL, EOMI, no exophtalmos or lid lag,  no myxedema, no xanthelasma; normal ears, nose and oropharynx Neck: normal jugular venous pulsations and no hepatojugular reflux; brisk carotid pulses without delay and no carotid bruits Chest: clear to auscultation, no signs of consolidation by percussion or palpation, normal fremitus, symmetrical and full respiratory excursions Cardiovascular: normal position and quality of the apical impulse, regular rhythm, normal first and second heart sounds, no murmurs, rubs or gallops Abdomen: no tenderness or distention, no masses by palpation, no abnormal pulsatility or arterial bruits, normal bowel sounds, no hepatosplenomegaly Extremities: no clubbing, cyanosis, there is trivial bilateral retromalleolar edema; 2+ radial, ulnar  and brachial pulses bilaterally; 2+ right femoral, posterior tibial and dorsalis pedis pulses; 2+ left femoral, posterior tibial and dorsalis pedis pulses; no subclavian or femoral bruits Neurological: grossly nonfocal Psych: Normal mood and affect  Wt Readings from Last 3 Encounters:  05/26/21 183 lb (83 kg)  05/16/21 193 lb 6.4 oz (87.7 kg)  04/14/21 192 lb 9.6 oz (87.4 kg)      Studies/Labs Reviewed:   EKG:  EKG is ordered today, personally reviewed, shows normal sinus rhythm with minor nonspecific T wave changes, actually improved from previous tracings.  QTc 438 ms Recent Labs: 08/25/2020: BNP 95.9 02/27/2021: TSH 1.92 04/14/2021: ALT 24; BUN 17; Creatinine 0.83; Hemoglobin 15.0; Platelet Count 133; Potassium 4.6; Sodium 143   Lipid Panel    Component Value Date/Time   CHOL 155 02/27/2021 0000   CHOL 193 11/18/2017 0857   TRIG 57 02/27/2021 0000   HDL 85 (A) 02/27/2021 0000   HDL 47 11/18/2017 0857   CHOLHDL 4.1 11/18/2017 0857   CHOLHDL 4.2 06/27/2015 1336   VLDL 48 (H) 06/27/2015 1336   LDLCALC 59 02/27/2021 0000   LDLCALC 104 (H) 11/18/2017 0857      Labs 03/23/2020 Total cholesterol 145, HDL 55, LDL 69, triglycerides 118 TSH 0.886  09/21/2020 Total cholesterol 122, HDL 56, LDL 47, triglycerides 101   CATH 04/05/2017  LM lesion, 70 %stenosed. Mid LAD lesion, 0 %stenosed at site of prior stent. Ost Cx lesion, 60 %stenosed. Prox RCA-1 lesion, 20 %stenosed. Prox RCA-2 lesion, 30 %stenosed. The left ventricular systolic function is normal. LV end diastolic pressure is normal. The left ventricular ejection fraction is 55-65% by visual estimate.   1. 70% eccentric distal left main stenosis. Angiographically similar to 2016. FFR 0.89 into the LAD and 0.86 into the LCx suggesting that this lesion is not flow limiting.  2. Otherwise nonobstructive CAD. Patent stent in LAD. 3. Normal LV function 4. Normal LVEDP   Plan: recommend continued medical therapy.     ASSESSMENT:    1. Chronic diastolic heart failure ([place])   2. Coronary artery disease of native artery of native heart with stable angina pectoris ([place])   3. Essential hypertension   4. PAD (peripheral artery disease) ([place])   5. Hypercholesterolemia   6. Mild obesity      PLAN:  In order of problems listed above:  CHF: Appears very close to euvolemic status today.  NYHA functional class II.  Minimal ankle swelling.  She is doing a good job self adjusting her dose of diuretic for weight gain.  It appears that she is lost true weight and will reassess for dry weight to 185 pounds (she can take an extra 40 mg of furosemide in the afternoon if her weight exceeds this).  Avoid excessive diuresis especially avoid the use of combined Lupron plus thiazide diuretic since this is led to serious acute kidney injury in the past.  CAD: Minimally symptomatic at this time.  Hard to say whether the heaviness she feels in her chest truly represents angina pectoris.  She has a known moderate stenosis in the left coronary artery that was not significant by FFR performed as recently as 2018.  Nuclear stress testing would not be helpful to reassess this.  I suggested we could follow-up with a coronary CT angiogram, but also mentioned that the neck step in therapy would be bypass surgery, which she has no intention of considering.  Therefore we decided to forego additional testing unless her symptoms worsen.  She does not tolerate beta-blockers due to bradycardia and ranolazine caused severe constipation. HTN: Well-controlled.  Always check blood pressure in the right arm. PAD: Known left subclavian stenosis, without subclavian steal syndrome or claudication. Hyperlipidemia: Excellent lipid profile on the current statin and ezetimibe combination. Obesity: Only borderline obese.  We have recalibrated her "dry weight" 285 pounds as she has lost real weight.   Medication Adjustments/Labs and Tests Ordered: Current  medicines are reviewed at length with the patient today.  Concerns regarding medicines are outlined above.  Medication changes, Labs and Tests ordered today are listed in the Patient Instructions below. Patient Instructions  Medication Instructions:  Furoesmide: Take 80 mg once daily. If your weight is over 185 pounds then you may take 40 mg in the afternoon (half a tablet) *If you need a refill on your cardiac medications before your next appointment, please call your pharmacy*   Lab Work: None ordered If you have labs (blood work) drawn today and your tests are completely normal, you will receive your results only by: [place] (if you have MyChart) OR A paper copy in the mail If you have any lab test that is abnormal or we need to change your treatment, we will call you to review the results.   Testing/Procedures: None ordered   Follow-Up: At [facility], you and your health needs are our priority.  As part of our continuing mission to provide you with exceptional heart care, we have created designated Provider Care Teams.  These Care Teams include your primary Cardiologist (physician) and Advanced Practice Providers (APPs -  Physician Assistants and Nurse Practitioners) who all work together to provide you with the care you need, when you need it.  We recommend signing up for the patient portal called "MyChart".  Sign up information is provided on this After Visit Summary.  MyChart is used to connect with patients for Virtual Visits (Telemedicine).  Patients are able to view lab/test results, encounter notes, upcoming appointments, etc.  Non-urgent messages can be sent to your provider as well.   To learn more about what you can do with MyChart, go to [email or web address].    Your next appointment:   6 month(s)  The format for your next appointment:   In Person  Provider:   You may see Sanda Klein, MD or one of the following Advanced Practice Providers on your  designated Care Team:   Almyra Deforest, PA-C Fabian Sharp, [place] or  Roby Lofts, PA-C      Signed, Sanda Klein, MD  05/26/2021 10:44 AM    [facility] [place], [place],   [address] Phone: [phone]; Fax: [phone]

## 2021-06-02 ENCOUNTER — Other Ambulatory Visit: Payer: Self-pay

## 2021-06-02 ENCOUNTER — Ambulatory Visit
Admission: RE | Admit: 2021-06-02 | Discharge: 2021-06-02 | Disposition: A | Discharge status: Home / Self Care | Payer: HMO | Source: Ambulatory Visit | Attending: Gastroenterology | Admitting: Gastroenterology

## 2021-06-02 ENCOUNTER — Encounter: Payer: Self-pay | Admitting: Adult Health

## 2021-06-02 ENCOUNTER — Ambulatory Visit (INDEPENDENT_AMBULATORY_CARE_PROVIDER_SITE_OTHER): Payer: HMO | Admitting: Adult Health

## 2021-06-02 VITALS — BP 138/82 | HR 70 | Temp 96.2°F | Ht 65.0 in | Wt 194.0 lb

## 2021-06-02 DIAGNOSIS — K7689 Other specified diseases of liver: Secondary | ICD-10-CM | POA: Diagnosis not present

## 2021-06-02 DIAGNOSIS — Z8719 Personal history of other diseases of the digestive system: Secondary | ICD-10-CM

## 2021-06-02 DIAGNOSIS — H00012 Hordeolum externum right lower eyelid: Secondary | ICD-10-CM | POA: Diagnosis not present

## 2021-06-02 MED ORDER — ERYTHROMYCIN 5 MG/GM OP OINT: 1.0000 "application " | TOPICAL_OINTMENT | Freq: Four times a day (QID) | OPHTHALMIC | 0 refills | Status: AC

## 2021-06-02 NOTE — Progress Notes (Signed)
[facility] clinic  Provider:  Durenda Age  DNP  Code Status:  Full Code  Goals of Care:  Advanced Directives 04/14/2021  Does Patient Have a Medical Advance Directive? Yes  Type of Advance Directive Living will;Healthcare Power of Attorney  Does patient want to make changes to medical advance directive? -  Copy of [place] in Chart? No - copy requested  Would patient like information on creating a medical advance directive? No - Patient declined     Chief Complaint  Patient presents with   Acute Visit    Patient presents today for possible right eye stye on the lower eyelid. Patient reports sun bother her eyes and her vision is affected from the stye.    HPI: Patient is a 87 y.o. female seen today for an acute visit for Right lower eye stye. She started having right lower eye stye 2 weeks ago and was prescribed Gentamicin ointment but it was not available at her pharmacy. She has not started her gentamicin. She stated that she has been applying warm compress and has noted that it has gotten smaller. She is coming to the clinic today because her eye tends to be more sensitive to the sun. She said that her right eye hurts and her vision is getting affected by the stye.   Past Medical History:  Diagnosis Date   Arthritis    right knee   Cancer ([place]) yrs ago   skin cancer removed from face   Chronic diastolic CHF (congestive heart failure) (HCC)    Chronic venous insufficiency    LEA VENOUS, 10/17/2011 - mild reflux in bilateral common femoral veins   CKD (chronic kidney disease), stage III ([place])    Coronary artery disease    a. s/p PCI/BMS to prox LAD and balloon angioplasty to mLAD with suboptimal result in 2000. b. Abnl nuc 2012, cath 09/2011 - showed that the overall territory of potential ischemia was small and attributable to a moderately diseased small second diagonal artery. Med rx.   Dyspnea    with activity   Headache    History of blood transfusion  2017   Hyperlipidemia    Hypertension    Hypertensive heart disease    Hypothyroidism    Obesity    Pneumonia    several times   Stroke [facility])    pt. states she had "light stroke" in sept. 1980   Urinary incontinence     Past Surgical History:  Procedure Laterality Date   ABDOMINAL HYSTERECTOMY  1970's   complete   ANKLE ARTHROSCOPY Right 07/10/2017   Procedure: ANKLE ARTHROSCOPY;  Surgeon: [name], DPM;  Location: [place];  Service: Podiatry;  Laterality: Right;   BACK SURGERY  07/26/2016   cervical neck surgery, [facility]   BLADDER SURGERY  2010   WITH MESH  bladder tach   bunion removal surgery Bilateral 15 yrs ago   CARDIAC CATHETERIZATION Left 09/25/2011   Medical management   CARDIAC CATHETERIZATION Left 03/25/2001   Normal LV function, LAD residual narrowing of less than 10%, normal ramus intermediate, circumflex, and RCA,    CARDIAC CATHETERIZATION  09/04/1999   LAD, 3x71m Tetra stent resulting in a reduction of the 80% stenosis to 0% residual   CARDIAC CATHETERIZATION N/A 01/26/2015   Procedure: Right/Left Heart Cath and Coronary Angiography;  Surgeon: TTroy Sine MD;  [place];  Service: Cardiovascular;  Laterality: N/A;   CARDIAC CATHETERIZATION N/A 01/27/2015  Procedure: Intravascular Pressure Wire/FFR Study;  Surgeon: Burnell Blanks, MD;  Location: [place] [facility];  Service: Cardiovascular;  Laterality: N/A;   CARDIAC CATHETERIZATION N/A 01/27/2015   Procedure: Right Heart Cath;  Surgeon: Burnell Blanks, MD;  Location: [place] [facility];  Service: Cardiovascular;  Laterality: N/A;   CHOLECYSTECTOMY     COLONOSCOPY WITH PROPOFOL N/A 03/07/2017   Procedure: COLONOSCOPY WITH PROPOFOL;  Surgeon: Juanita Craver, MD;  Location: [facility];  Service: Endoscopy;  Laterality: N/A;   CORONARY STENT PLACEMENT     LAD x 1   ganglion cyst removal  yrs ago   x 2   ILIAC VEIN ANGIOPLASTY / STENTING  02/15/2015    INTRAVASCULAR PRESSURE WIRE/FFR STUDY N/A 04/05/2017   Procedure: Intravascular Pressure Wire/FFR Study;  Surgeon: Martinique, Peter M, MD;  Location: [place] [facility];  Service: Cardiovascular;  Laterality: N/A;   IVC FILTER INSERTION  2016   IVC FILTER REMOVAL  02/15/2015   at [place] CATH AND CORONARY ANGIOGRAPHY N/A 04/05/2017   Procedure: Left Heart Cath and Coronary Angiography;  Surgeon: Martinique, Peter M, MD;  Location: [place] [facility];  Service: Cardiovascular;  Laterality: N/A;   LEFT HEART CATHETERIZATION WITH CORONARY ANGIOGRAM N/A 09/25/2011   Procedure: LEFT HEART CATHETERIZATION WITH CORONARY ANGIOGRAM;  Surgeon: Sanda Klein, MD;  Location: [place] [facility];  Service: Cardiovascular;  Laterality: N/A;   multiple bladder surgeries to remove mesh     ROTATOR CUFF REPAIR Right    stent to groin Left 08/2014   left leg   TENDON REPAIR Right 07/10/2017   Procedure: RIGHT PERONEAL TENDON REPAIR;  Surgeon: [name], DPM;  Location: [place];  Service: Podiatry;  Laterality: Right;   TENDON REPAIR Right 05/27/2019   Procedure: PERONEAL TENDON REPAIR x2;  Surgeon: [name], DPM;  Location: [facility];  Service: Podiatry;  Laterality: Right;    Allergies  Allergen Reactions   Atorvastatin Anaphylaxis    Patient does not remember; caused pneumonia- took her off of it per patient.    Penicillins Anaphylaxis, Hives and Swelling    Tongue swelling Did it involve swelling of the face/tongue/throat, SOB, or low BP? Yes Did it involve sudden or severe rash/hives, skin peeling, or any reaction on the inside of your mouth or nose? Yes Did you need to seek medical attention at a hospital or doctor's office? Yes When did it last happen?  29 or 87 year old    If all above answers are "NO", may proceed with cephalosporin use.    Shellfish Allergy Anaphylaxis and Nausea And Vomiting    Severe nausea and vomiting   Aminophylline Itching, Swelling and Rash    Pt experienced burning  urination, itching, redness/rash, and swelling of genitals after given this medication via IV push.   Iodinated Diagnostic Agents Swelling and Other (See Comments)    SWELLING REACTION UNSPECIFIED  fFLUSHING   Latex Other (See Comments)    Causes blisters   Oxybutynin Chloride Other (See Comments)    "blisters"   Vancomycin Hives    06/02/2018- Hives arm, back and chest   Adhesive [Tape] Rash    Paper tape is ok   Betadine [Povidone Iodine] Rash   Ciprofloxacin Hives   Codeine Nausea And Vomiting    .   Ranolazine Other (See Comments)    Constipation  .    Outpatient Encounter Medications as of 06/02/2021  Medication Sig   amLODipine (NORVASC) 2.5  MG tablet Take 1 tablet (2.5 mg total) by mouth daily.   aspirin EC 81 MG tablet Take 1 tablet (81 mg total) by mouth every other day.   bacitracin-polymyxin b (POLYSPORIN) ophthalmic ointment Place 1 application into the right eye every 12 (twelve) hours.   cholecalciferol (VITAMIN D3) 25 MCG (1000 UNIT) tablet Take 1,000 Units by mouth daily.    Docusate Sodium 100 MG capsule Take 200 mg by mouth daily at 8 pm. If no BM patient will also take 2 in the pm   ezetimibe (ZETIA) 10 MG tablet TAKE 1 TABLET(10 MG) BY MOUTH DAILY   fluticasone (FLONASE) 50 MCG/ACT nasal spray Place 2 sprays into both nostrils daily.   furosemide (LASIX) 80 MG tablet Take 1 tablet (80 mg total) by mouth daily. TAKE AN EXTRA '40mg'$  (half tablet) IN THE AFTERNOON ONLY IF WEIGHT IS OVER 185lbs.   gentamicin ointment (GARAMYCIN) 0.1 % Apply 1 application topically 3 (three) times daily.   gentamicin ointment (GARAMYCIN) 0.1 % Apply 1 application topically 3 (three) times daily.   isosorbide dinitrate (ISORDIL) 30 MG tablet Take 60 mg by mouth daily at 12 noon. In the morning   KLOR-CON M20 20 MEQ tablet Take 1 tablet (20 mEq total) by mouth See admin instructions. Take one tablet (20 meq) by mouth every morning and with each afternoon dose of lasix   levothyroxine  (SYNTHROID, LEVOTHROID) 112 MCG tablet Take 112 mcg by mouth daily before breakfast.    OVER THE COUNTER MEDICATION Take 2 tablets by mouth daily. "Focus factor"   pravastatin (PRAVACHOL) 80 MG tablet Take 80 mg by mouth at bedtime.   Facility-Administered Encounter Medications as of 06/02/2021  Medication   betamethasone acetate-betamethasone sodium phosphate (CELESTONE) injection 6 mg    Review of Systems:  Review of Systems  Constitutional:  Negative for activity change and chills.  HENT:  Negative for congestion.   Eyes:  Positive for pain. Negative for discharge and itching.       Stye started 2 weeks ago  Respiratory:  Negative for cough and shortness of breath.   Gastrointestinal:  Negative for abdominal distention and abdominal pain.  Genitourinary:  Negative for difficulty urinating and dysuria.  Musculoskeletal:  Negative for gait problem.  Neurological:  Positive for dizziness.       Dizzy at night and use Flonase for allergic rhinitis  Psychiatric/Behavioral: Negative.     Health Maintenance  Topic Date Due   Zoster Vaccines- Shingrix (1 of 2) Never done   COVID-19 Vaccine (4 - Booster) 04/08/2020   INFLUENZA VACCINE  Never done   TETANUS/TDAP  12/18/2025   DEXA SCAN  Completed   HPV VACCINES  Aged Out    Physical Exam: Vitals:   06/02/21 1316  BP: 138/82  Pulse: 70  Temp: (!) 96.2 F (35.7 C)  TempSrc: Temporal  SpO2: 96%  Weight: 194 lb (88 kg)  Height: '5\' 5"'$  (1.651 m)   Body mass index is 32.28 kg/m. Physical Exam Constitutional:      Appearance: Normal appearance.  HENT:     Head: Normocephalic and atraumatic.     Nose: Nose normal.  Eyes:     Comments: Right lower eye with stye  Cardiovascular:     Rate and Rhythm: Normal rate and regular rhythm.  Pulmonary:     Effort: Pulmonary effort is normal.     Breath sounds: Normal breath sounds.  Abdominal:     General: Bowel sounds are normal.  Palpations: Abdomen is soft.  Musculoskeletal:         General: No swelling. Normal range of motion.     Cervical back: Normal range of motion.     Right lower leg: Edema present.     Left lower leg: Edema present.  Skin:    General: Skin is warm and dry.  Neurological:     Mental Status: She is alert.  Psychiatric:        Mood and Affect: Mood normal.   Labs reviewed: Basic Metabolic Panel: Recent Labs    08/25/20 0845 09/16/20 1747 02/27/21 0000 04/14/21 1541  NA 140 138 145 143  K 3.1* 4.2 4.0 4.6  CL 93* 99 103 102  CO2 28 29 34* 34*  GLUCOSE 103* 98  --  89  BUN '24 15 20 17  '$ CREATININE 0.82 0.91 0.9 0.83  CALCIUM 9.4 8.8* 9.0 10.1  TSH  --   --  1.92  --    Liver Function Tests: Recent Labs    02/27/21 0000 04/14/21 1541  AST 40* 41  ALT 38* 24  ALKPHOS 89 90  BILITOT  --  1.0  PROT  --  6.8  ALBUMIN 3.8 3.6   No results for input(s): LIPASE, AMYLASE in the last 8760 hours. No results for input(s): AMMONIA in the last 8760 hours. CBC: Recent Labs    09/16/20 1747 02/27/21 0000 04/03/21 1233 04/14/21 1541  WBC 3.9* 4.7 4.9 6.1  NEUTROABS 1.9 2.90  --  3.9  HGB 15.5* 15.9 14.8 15.0  HCT 48.0* 48* 46.6 45.1  MCV 100.0  --  100* 99.8  PLT 91* 126* 128* 133*   Lipid Panel: Recent Labs    02/27/21 0000  CHOL 155  HDL 85*  LDLCALC 59  TRIG 57   Lab Results  Component Value Date   HGBA1C 6.1 (H) 01/26/2015    Procedures since last visit: No results found.  Assessment/Plan  1. Hordeolum externum of right lower eyelid -  will discontinue Gentamicin ointment - will start on new medication - erythromycin ophthalmic ointment; Place 1 application into the right eye 4 (four) times daily for 7 days.  Dispense: 3.5 g; Refill: 0 -  instructed to continue doing warm compress and keep eyes clean    Labs/tests ordered:  None  Next appt:  06/15/2021

## 2021-06-02 NOTE — Patient Instructions (Signed)
Stye ?A stye, also known as a hordeolum, is a bump that forms on an eyelid. It may look like a pimple next to the eyelash. A stye can form inside the eyelid (internal stye) or outside the eyelid (external stye). A stye can cause redness, swelling, and pain on the eyelid. ?Styes are very common. Anyone can get them at any age. They usually occur in just one eye at a time, but you may have more than one in either eye. ?What are the causes? ?A stye is caused by an infection. The infection is almost always caused by bacteria called Staphylococcus aureus. This is a common type of bacteria that lives on the skin. ?An internal stye may result from an infected oil-producing gland inside the eyelid. An external stye may be caused by an infection at the base of the eyelash (hair follicle). ?What increases the risk? ?You are more likely to develop a stye if: ?You have had a stye before. ?You have any of these conditions: ?Red, itchy, inflamed eyelids (blepharitis). ?A skin condition such as seborrheic dermatitis or rosacea. ?High fat levels in your blood (lipids). ?Dry eyes. ?What are the signs or symptoms? ?The most common symptom of a stye is eyelid pain. Internal styes are more painful than external styes. Other symptoms may include: ?Painful swelling of your eyelid. ?A scratchy feeling in your eye. ?Tearing and redness of your eye. ?A pimple-like bump on the edge of the eyelid. ?Pus draining from the stye. ?How is this diagnosed? ?Your health care provider may be able to diagnose a stye just by examining your eye. The health care provider may also check to make sure: ?You do not have a fever or other signs of a more serious infection. ?The infection has not spread to other parts of your eye or areas around your eye. ?How is this treated? ?Most styes will clear up in a few days without treatment or with warm compresses applied to the area. You may need to use antibiotic drops or ointment to treat an infection. Sometimes,  steroid drops or ointment are used in addition to antibiotics. ?In some cases, your health care provider may give you a small steroid injection in the eyelid. ?If your stye does not heal with routine treatment, your health care provider may drain pus from the stye using a thin blade or needle. This may be done if the stye is large, causing a lot of pain, or affecting your vision. ?Follow these instructions at home: ?Take over-the-counter and prescription medicines only as told by your health care provider. This includes eye drops or ointments. ?If you were prescribed an antibiotic medicine, steroid medicine, or both, apply or use them as told by your health care provider. Do not stop using the medicine even if your condition improves. ?Apply a warm, wet cloth (warm compress) to your eye for 5-10 minutes, 4 to 6 times a day. ?Clean the affected eyelid as directed by your health care provider. ?Do not wear contact lenses or eye makeup until your stye has healed and your health care provider says that it is safe. ?Do not try to pop or drain the stye. ?Do not rub your eye. ?Contact a health care provider if: ?You have chills or a fever. ?Your stye does not go away after several days. ?Your stye affects your vision. ?Your eyeball becomes swollen, red, or painful. ?Get help right away if: ?You have pain when moving your eye around. ?Summary ?A stye is a bump that forms   on an eyelid. It may look like a pimple next to the eyelash. A stye can form inside the eyelid (internal stye) or outside the eyelid (external stye). A stye can cause redness, swelling, and pain on the eyelid. Your health care provider may be able to diagnose a stye just by examining your eye. Apply a warm, wet cloth (warm compress) to your eye for 5-10 minutes, 4 to 6 times a day. This information is not intended to replace advice given to you by your health care provider. Make sure you discuss any questions you have with your health care  provider. Document Revised: 11/09/2020 Document Reviewed: 11/09/2020 Elsevier Patient Education  [place].

## 2021-06-13 ENCOUNTER — Telehealth: Payer: Self-pay | Admitting: *Deleted

## 2021-06-13 NOTE — Telephone Encounter (Signed)
Patient is calling to cancel her upcoming appointment on Friday 06/16/21, injections are not helping. Please call

## 2021-06-15 ENCOUNTER — Ambulatory Visit: Payer: HMO | Admitting: Family

## 2021-06-15 ENCOUNTER — Other Ambulatory Visit: Payer: Self-pay

## 2021-06-15 DIAGNOSIS — Z0000 Encounter for general adult medical examination without abnormal findings: Secondary | ICD-10-CM

## 2021-06-15 NOTE — Patient Instructions (Signed)
Jill Preston , Thank you for taking time to come for your Medicare Wellness Visit. I appreciate your ongoing commitment to your health goals. Please review the following plan we discussed and let me know if I can assist you in the future.   Screening recommendations/referrals: Colonoscopy N/A  Mammogram  Bone Density  Recommended yearly ophthalmology/optometry visit for glaucoma screening and checkup Recommended yearly dental visit for hygiene and checkup  Vaccinations: Influenza vaccine declined  Pneumococcal vaccine  Tdap vaccine Up to date  Shingles vaccine     Advanced directives: yes   Conditions/risks identified: Advance age female > 13 yrs,dyslipidemia,Hypertension,BMI > 30   Next appointment: 1 year    Preventive Care 85 Years and Older, Female Preventive care refers to lifestyle choices and visits with your health care provider that can promote health and wellness. What does preventive care include? A yearly physical exam. This is also called an annual well check. Dental exams once or twice a year. Routine eye exams. Ask your health care provider how often you should have your eyes checked. Personal lifestyle choices, including: Daily care of your teeth and gums. Regular physical activity. Eating a healthy diet. Avoiding tobacco and drug use. Limiting alcohol use. Practicing safe sex. Taking low-dose aspirin every day. Taking vitamin and mineral supplements as recommended by your health care provider. What happens during an annual well check? The services and screenings done by your health care provider during your annual well check will depend on your age, overall health, lifestyle risk factors, and family history of disease. Counseling  Your health care provider may ask you questions about your: Alcohol use. Tobacco use. Drug use. Emotional well-being. Home and relationship well-being. Sexual activity. Eating habits. History of falls. Memory and ability to  understand (cognition). Work and work Statistician. Reproductive health. Screening  You may have the following tests or measurements: Height, weight, and BMI. Blood pressure. Lipid and cholesterol levels. These may be checked every 5 years, or more frequently if you are over 85 years old. Skin check. Lung cancer screening. You may have this screening every year starting at age 85 if you have a 30-pack-year history of smoking and currently smoke or have quit within the past 15 years. Fecal occult blood test (FOBT) of the stool. You may have this test every year starting at age 85. Flexible sigmoidoscopy or colonoscopy. You may have a sigmoidoscopy every 5 years or a colonoscopy every 10 years starting at age 73. Hepatitis C blood test. Hepatitis B blood test. Sexually transmitted disease (STD) testing. Diabetes screening. This is done by checking your blood sugar (glucose) after you have not eaten for a while (fasting). You may have this done every 1-3 years. Bone density scan. This is done to screen for osteoporosis. You may have this done starting at age 85. Mammogram. This may be done every 1-2 years. Talk to your health care provider about how often you should have regular mammograms. Talk with your health care provider about your test results, treatment options, and if necessary, the need for more tests. Vaccines  Your health care provider may recommend certain vaccines, such as: Influenza vaccine. This is recommended every year. Tetanus, diphtheria, and acellular pertussis (Tdap, Td) vaccine. You may need a Td booster every 10 years. Zoster vaccine. You may need this after age 85. Pneumococcal 13-valent conjugate (PCV13) vaccine. One dose is recommended after age 85. Pneumococcal polysaccharide (PPSV23) vaccine. One dose is recommended after age 6. Talk to your health care provider about  which screenings and vaccines you need and how often you need them. This information is not  intended to replace advice given to you by your health care provider. Make sure you discuss any questions you have with your health care provider. Document Released: 09/30/2015 Document Revised: 05/23/2016 Document Reviewed: 07/05/2015 Elsevier Interactive Patient Education  2017 [place] Prevention in the Home Falls can cause injuries. They can happen to people of all ages. There are many things you can do to make your home safe and to help prevent falls. What can I do on the outside of my home? Regularly fix the edges of walkways and driveways and fix any cracks. Remove anything that might make you trip as you walk through a door, such as a raised step or threshold. Trim any bushes or trees on the path to your home. Use bright outdoor lighting. Clear any walking paths of anything that might make someone trip, such as rocks or tools. Regularly check to see if handrails are loose or broken. Make sure that both sides of any steps have handrails. Any raised decks and porches should have guardrails on the edges. Have any leaves, snow, or ice cleared regularly. Use sand or salt on walking paths during winter. Clean up any spills in your garage right away. This includes oil or grease spills. What can I do in the bathroom? Use night lights. Install grab bars by the toilet and in the tub and shower. Do not use towel bars as grab bars. Use non-skid mats or decals in the tub or shower. If you need to sit down in the shower, use a plastic, non-slip stool. Keep the floor dry. Clean up any water that spills on the floor as soon as it happens. Remove soap buildup in the tub or shower regularly. Attach bath mats securely with double-sided non-slip rug tape. Do not have throw rugs and other things on the floor that can make you trip. What can I do in the bedroom? Use night lights. Make sure that you have a light by your bed that is easy to reach. Do not use any sheets or blankets that are  too big for your bed. They should not hang down onto the floor. Have a firm chair that has side arms. You can use this for support while you get dressed. Do not have throw rugs and other things on the floor that can make you trip. What can I do in the kitchen? Clean up any spills right away. Avoid walking on wet floors. Keep items that you use a lot in easy-to-reach places. If you need to reach something above you, use a strong step stool that has a grab bar. Keep electrical cords out of the way. Do not use floor polish or wax that makes floors slippery. If you must use wax, use non-skid floor wax. Do not have throw rugs and other things on the floor that can make you trip. What can I do with my stairs? Do not leave any items on the stairs. Make sure that there are handrails on both sides of the stairs and use them. Fix handrails that are broken or loose. Make sure that handrails are as long as the stairways. Check any carpeting to make sure that it is firmly attached to the stairs. Fix any carpet that is loose or worn. Avoid having throw rugs at the top or bottom of the stairs. If you do have throw rugs, attach them to the floor with  carpet tape. Make sure that you have a light switch at the top of the stairs and the bottom of the stairs. If you do not have them, ask someone to add them for you. What else can I do to help prevent falls? Wear shoes that: Do not have high heels. Have rubber bottoms. Are comfortable and fit you well. Are closed at the toe. Do not wear sandals. If you use a stepladder: Make sure that it is fully opened. Do not climb a closed stepladder. Make sure that both sides of the stepladder are locked into place. Ask someone to hold it for you, if possible. Clearly mark and make sure that you can see: Any grab bars or handrails. First and last steps. Where the edge of each step is. Use tools that help you move around (mobility aids) if they are needed. These  include: Canes. Walkers. Scooters. Crutches. Turn on the lights when you go into a dark area. Replace any light bulbs as soon as they burn out. Set up your furniture so you have a clear path. Avoid moving your furniture around. If any of your floors are uneven, fix them. If there are any pets around you, be aware of where they are. Review your medicines with your doctor. Some medicines can make you feel dizzy. This can increase your chance of falling. Ask your doctor what other things that you can do to help prevent falls. This information is not intended to replace advice given to you by your health care provider. Make sure you discuss any questions you have with your health care provider. Document Released: 06/30/2009 Document Revised: 02/09/2016 Document Reviewed: 10/08/2014 Elsevier Interactive Patient Education  2017 [place] Maintenance, Female Adopting a healthy lifestyle and getting preventive care are important in promoting health and wellness. Ask your health care provider about: The right schedule for you to have regular tests and exams. Things you can do on your own to prevent diseases and keep yourself healthy. What should I know about diet, weight, and exercise? Eat a healthy diet  Eat a diet that includes plenty of vegetables, fruits, low-fat dairy products, and lean protein. Do not eat a lot of foods that are high in solid fats, added sugars, or sodium. Maintain a healthy weight Body mass index (BMI) is used to identify weight problems. It estimates body fat based on height and weight. Your health care provider can help determine your BMI and help you achieve or maintain a healthy weight. Get regular exercise Get regular exercise. This is one of the most important things you can do for your health. Most adults should: Exercise for at least 150 minutes each week. The exercise should increase your heart rate and make you sweat (moderate-intensity  exercise). Do strengthening exercises at least twice a week. This is in addition to the moderate-intensity exercise. Spend less time sitting. Even light physical activity can be beneficial. Watch cholesterol and blood lipids Have your blood tested for lipids and cholesterol at 85 years of age, then have this test every 5 years. Have your cholesterol levels checked more often if: Your lipid or cholesterol levels are high. You are older than 85 years of age. You are at high risk for heart disease. What should I know about cancer screening? Depending on your health history and family history, you may need to have cancer screening at various ages. This may include screening for: Breast cancer. Cervical cancer. Colorectal cancer. Skin cancer. Lung cancer. What  should I know about heart disease, diabetes, and high blood pressure? Blood pressure and heart disease High blood pressure causes heart disease and increases the risk of stroke. This is more likely to develop in people who have high blood pressure readings, are of African descent, or are overweight. Have your blood pressure checked: Every 3-5 years if you are 52-65 years of age. Every year if you are 80 years old or older. Diabetes Have regular diabetes screenings. This checks your fasting blood sugar level. Have the screening done: Once every three years after age 20 if you are at a normal weight and have a low risk for diabetes. More often and at a younger age if you are overweight or have a high risk for diabetes. What should I know about preventing infection? Hepatitis B If you have a higher risk for hepatitis B, you should be screened for this virus. Talk with your health care provider to find out if you are at risk for hepatitis B infection. Hepatitis C Testing is recommended for: Everyone born from [birth year] through 1963. Anyone with known risk factors for hepatitis C. Sexually transmitted infections (STIs) Get screened for STIs,  including gonorrhea and chlamydia, if: You are sexually active and are younger than 85 years of age. You are older than 85 years of age and your health care provider tells you that you are at risk for this type of infection. Your sexual activity has changed since you were last screened, and you are at increased risk for chlamydia or gonorrhea. Ask your health care provider if you are at risk. Ask your health care provider about whether you are at high risk for HIV. Your health care provider may recommend a prescription medicine to help prevent HIV infection. If you choose to take medicine to prevent HIV, you should first get tested for HIV. You should then be tested every 3 months for as long as you are taking the medicine. Pregnancy If you are about to stop having your period (premenopausal) and you may become pregnant, seek counseling before you get pregnant. Take 400 to 800 micrograms (mcg) of folic acid every day if you become pregnant. Ask for birth control (contraception) if you want to prevent pregnancy. Osteoporosis and menopause Osteoporosis is a disease in which the bones lose minerals and strength with aging. This can result in bone fractures. If you are 66 years old or older, or if you are at risk for osteoporosis and fractures, ask your health care provider if you should: Be screened for bone loss. Take a calcium or vitamin D supplement to lower your risk of fractures. Be given hormone replacement therapy (HRT) to treat symptoms of menopause. Follow these instructions at home: Lifestyle Do not use any products that contain nicotine or tobacco, such as cigarettes, e-cigarettes, and chewing tobacco. If you need help quitting, ask your health care provider. Do not use street drugs. Do not share needles. Ask your health care provider for help if you need support or information about quitting drugs. Alcohol use Do not drink alcohol if: Your health care provider tells you not to  drink. You are pregnant, may be pregnant, or are planning to become pregnant. If you drink alcohol: Limit how much you use to 0-1 drink a day. Limit intake if you are breastfeeding. Be aware of how much alcohol is in your drink. In the [place] one drink equals one 12 oz bottle of beer (355 mL), one 5 oz glass of wine (148 mL), or  one 1 oz glass of hard liquor (44 mL). General instructions Schedule regular health, dental, and eye exams. Stay current with your vaccines. Tell your health care provider if: You often feel depressed. You have ever been abused or do not feel safe at home. Summary Adopting a healthy lifestyle and getting preventive care are important in promoting health and wellness. Follow your health care provider's instructions about healthy diet, exercising, and getting tested or screened for diseases. Follow your health care provider's instructions on monitoring your cholesterol and blood pressure. This information is not intended to replace advice given to you by your health care provider. Make sure you discuss any questions you have with your health care provider. Document Revised: 11/11/2020 Document Reviewed: 08/27/2018 Elsevier Patient Education  2022 [employer].

## 2021-06-15 NOTE — Progress Notes (Signed)
Subjective:  This service is provided via telemedicine  No vital signs collected/recorded due to the encounter was a telemedicine visit.   Location of patient (ex: home, work):  Home  Patient consents to a telephone visit:  Yes  Location of the provider (ex: office, home):  Office  Name of any referring provider:  Jamaul Heist, [name], NP   Names of all persons participating in the telemedicine service and their role in the encounter:  Lowella Dell, [place], CMA, [place] Sultan Pargas,NP  Time spent on call:  12 minutes    Jill Preston is a 87 y.o. female who presents for an Initial Medicare Annual Wellness Visit.  Review of Systems     Cardiac Risk Factors include: advanced age (>71men, >29 women);dyslipidemia;hypertension;obesity (BMI >30kg/m2)     Objective:    There were no vitals filed for this visit. There is no height or weight on file to calculate BMI.  Advanced Directives 04/14/2021 03/07/2021 10/19/2019 05/27/2019 10/06/2018 06/02/2018 06/02/2018  Does Patient Have a Medical Advance Directive? Yes Yes Yes Yes Yes Yes Yes  Type of Advance Directive Living will;Healthcare Power of [place];Living will - [place];Living will [place];Living will [place];Living will [place];Living will  Does patient want to make changes to medical advance directive? - No - Patient declined No - Patient declined No - Patient declined No - Patient declined No - Patient declined -  Copy of [place] in Chart? No - copy requested No - copy requested - No - copy requested No - copy requested No - copy requested No - copy requested  Would patient like information on creating a medical advance directive? No - Patient declined - - - - - -    Current Medications (verified) Outpatient Encounter Medications as of 06/15/2021  Medication Sig   amLODipine (NORVASC) 2.5 MG  tablet Take 1 tablet (2.5 mg total) by mouth daily.   aspirin EC 81 MG tablet Take 1 tablet (81 mg total) by mouth every other day.   bacitracin-polymyxin b (POLYSPORIN) ophthalmic ointment Place 1 application into the right eye every 12 (twelve) hours.   cholecalciferol (VITAMIN D3) 25 MCG (1000 UNIT) tablet Take 1,000 Units by mouth daily.    Docusate Sodium 100 MG capsule Take 200 mg by mouth daily at 8 pm. If no BM patient will also take 2 in the pm   ezetimibe (ZETIA) 10 MG tablet TAKE 1 TABLET(10 MG) BY MOUTH DAILY   fluticasone (FLONASE) 50 MCG/ACT nasal spray Place 2 sprays into both nostrils daily.   furosemide (LASIX) 80 MG tablet Take 1 tablet (80 mg total) by mouth daily. TAKE AN EXTRA 40mg  (half tablet) IN THE AFTERNOON ONLY IF WEIGHT IS OVER 185lbs.   isosorbide dinitrate (ISORDIL) 30 MG tablet Take 60 mg by mouth daily at 12 noon. In the morning   KLOR-CON M20 20 MEQ tablet Take 1 tablet (20 mEq total) by mouth See admin instructions. Take one tablet (20 meq) by mouth every morning and with each afternoon dose of lasix   levothyroxine (SYNTHROID, LEVOTHROID) 112 MCG tablet Take 112 mcg by mouth daily before breakfast.    OVER THE COUNTER MEDICATION Take 2 tablets by mouth daily. "Focus factor"   pravastatin (PRAVACHOL) 80 MG tablet Take 80 mg by mouth at bedtime.   Facility-Administered Encounter Medications as of 06/15/2021  Medication   betamethasone acetate-betamethasone sodium phosphate (CELESTONE) injection 6  mg    Allergies (verified) Atorvastatin, Penicillins, Shellfish allergy, Aminophylline, Iodinated diagnostic agents, Latex, Oxybutynin chloride, Vancomycin, Adhesive [tape], Betadine [povidone iodine], Ciprofloxacin, Codeine, and Ranolazine   History: Past Medical History:  Diagnosis Date   Arthritis    right knee   Cancer ([place]) yrs ago   skin cancer removed from face   Chronic diastolic CHF (congestive heart failure) ([place])    Chronic venous insufficiency    LEA  VENOUS, 10/17/2011 - mild reflux in bilateral common femoral veins   CKD (chronic kidney disease), stage III ([place])    Coronary artery disease    a. s/p PCI/BMS to prox LAD and balloon angioplasty to mLAD with suboptimal result in 2000. b. Abnl nuc 2012, cath 09/2011 - showed that the overall territory of potential ischemia was small and attributable to a moderately diseased small second diagonal artery. Med rx.   Dyspnea    with activity   Headache    History of blood transfusion 2017   Hyperlipidemia    Hypertension    Hypertensive heart disease    Hypothyroidism    Obesity    Pneumonia    several times   Stroke [facility])    pt. states she had "light stroke" in sept. 1980   Urinary incontinence    Past Surgical History:  Procedure Laterality Date   ABDOMINAL HYSTERECTOMY  1970's   complete   ANKLE ARTHROSCOPY Right 07/10/2017   Procedure: ANKLE ARTHROSCOPY;  Surgeon: [name], DPM;  Location: [place];  Service: Podiatry;  Laterality: Right;   BACK SURGERY  07/26/2016   cervical neck surgery, [place]   BLADDER SURGERY  2010   WITH MESH  bladder tach   bunion removal surgery Bilateral 15 yrs ago   CARDIAC CATHETERIZATION Left 09/25/2011   Medical management   CARDIAC CATHETERIZATION Left 03/25/2001   Normal LV function, LAD residual narrowing of less than 10%, normal ramus intermediate, circumflex, and RCA,    CARDIAC CATHETERIZATION  09/04/1999   LAD, 3x71mm Tetra stent resulting in a reduction of the 80% stenosis to 0% residual   CARDIAC CATHETERIZATION N/A 01/26/2015   Procedure: Right/Left Heart Cath and Coronary Angiography;  Surgeon: Troy Sine, MD;  Location: [place] CV LAB;  Service: Cardiovascular;  Laterality: N/A;   CARDIAC CATHETERIZATION N/A 01/27/2015   Procedure: Intravascular Pressure Wire/FFR Study;  Surgeon: Burnell Blanks, MD;  Location: [place] CV LAB;  Service: Cardiovascular;  Laterality: N/A;   CARDIAC CATHETERIZATION N/A  01/27/2015   Procedure: Right Heart Cath;  Surgeon: Burnell Blanks, MD;  Location: [place] CV LAB;  Service: Cardiovascular;  Laterality: N/A;   CHOLECYSTECTOMY     COLONOSCOPY WITH PROPOFOL N/A 03/07/2017   Procedure: COLONOSCOPY WITH PROPOFOL;  Surgeon: Juanita Craver, MD;  Location: [facility];  Service: Endoscopy;  Laterality: N/A;   CORONARY STENT PLACEMENT     LAD x 1   ganglion cyst removal  yrs ago   x 2   ILIAC VEIN ANGIOPLASTY / STENTING  02/15/2015   INTRAVASCULAR PRESSURE WIRE/FFR STUDY N/A 04/05/2017   Procedure: Intravascular Pressure Wire/FFR Study;  Surgeon: Martinique, Peter M, MD;  Location: [place] CV LAB;  Service: Cardiovascular;  Laterality: N/A;   IVC FILTER INSERTION  2016   IVC FILTER REMOVAL  02/15/2015   at [place] CATH AND CORONARY ANGIOGRAPHY N/A 04/05/2017   Procedure: Left Heart Cath and Coronary Angiography;  Surgeon: Martinique, Peter M, MD;  Location: [place]  CV LAB;  Service: Cardiovascular;  Laterality: N/A;   LEFT HEART CATHETERIZATION WITH CORONARY ANGIOGRAM N/A 09/25/2011   Procedure: LEFT HEART CATHETERIZATION WITH CORONARY ANGIOGRAM;  Surgeon: Sanda Klein, MD;  Location: [place] CATH LAB;  Service: Cardiovascular;  Laterality: N/A;   multiple bladder surgeries to remove mesh     ROTATOR CUFF REPAIR Right    stent to groin Left 08/2014   left leg   TENDON REPAIR Right 07/10/2017   Procedure: RIGHT PERONEAL TENDON REPAIR;  Surgeon: [name], DPM;  Location: [place];  Service: Podiatry;  Laterality: Right;   TENDON REPAIR Right 05/27/2019   Procedure: PERONEAL TENDON REPAIR x2;  Surgeon: [name], DPM;  Location: [facility];  Service: Podiatry;  Laterality: Right;   Family History  Problem Relation Age of Onset   Cancer Mother    Heart disease Father    Heart disease Sister    High blood pressure Sister    Heart disease Sister    Cancer Sister    Heart disease Brother    Diabetes Brother    Brain cancer Brother    High  blood pressure Brother    Heart disease Brother    Heart attack Brother    Heart disease Brother    High blood pressure Brother    Social History   Socioeconomic History   Marital status: Married    Spouse name: Not on file   Number of children: Not on file   Years of education: Not on file   Highest education level: Not on file  Occupational History   Not on file  Tobacco Use   Smoking status: Never   Smokeless tobacco: Never  Vaping Use   Vaping Use: Never used  Substance and Sexual Activity   Alcohol use: No   Drug use: No   Sexual activity: Not on file  Other Topics Concern   Not on file  Social History Narrative   Per [facility] New Patient Packet Abstracted on 03/06/2021      Diet: Left blank       Caffeine: No      Married, if yes what year: Yes, 1955      Do you live in a house, apartment, assisted living, condo, trailer, ect: House      Is it one or more stories: 1 story      How many persons live in your home? 2      Pets: No      Highest level or education completed: 12 th grade      Current/Past profession: Futures trader       Exercise:          Yes       Type and how often: Walking          Living Will: Yes   DNR: No   POA/HPOA: Yes      Functional Status:   Do you have difficulty bathing or dressing yourself? Left Blank    Do you have difficulty preparing food or eating?Left Blank   Do you have difficulty managing your medications?Left Blank   Do you have difficulty managing your finances?Left Blank   Do you have difficulty affording your medications?Left Blank   Social Determinants of Health   Financial Resource Strain: Not on file  Food Insecurity: Not on file  Transportation Needs: Not on file  Physical Activity: Not on file  Stress: Not on file  Social Connections: Not on file  Tobacco Counseling Counseling given: Not Answered   Clinical Intake:  Pre-visit preparation completed: No  Pain : No/denies pain     BMI  - recorded: 32.28 Nutritional Status: BMI > 30  Obese Diabetes: No  How often do you need to have someone help you when you read instructions, pamphlets, or other written materials from your doctor or pharmacy?: 1 - Never What is the last grade level you completed in school?: Diploma  Diabetic?No   Interpreter Needed?: No  Comments: Wissam Resor,FNP-C   Activities of Daily Living In your present state of health, do you have any difficulty performing the following activities: 06/15/2021  Hearing? N  Vision? N  Comment wear reading glasses  Difficulty concentrating or making decisions? Y  Walking or climbing stairs? N  Dressing or bathing? N  Doing errands, shopping? N  Preparing Food and eating ? N  Using the Toilet? N  In the past six months, have you accidently leaked urine? N  Do you have problems with loss of bowel control? N  Managing your Medications? N  Managing your Finances? N  Housekeeping or managing your Housekeeping? N  Some recent data might be hidden    Patient Care Team: Cherie Lasalle, [name], NP as PCP - General (Family Medicine) Croitoru, Dani Gobble, MD as PCP - Cardiology (Cardiology) Croitoru, Dani Gobble, MD as Consulting Physician (Cardiology) Druscilla Brownie, MD as Referring Physician (Dermatology) [name], DPM as Consulting Physician (Podiatry) [name], [name], Rogers (Optometry) [name], MD as Referring Physician (Gynecology)  Indicate any recent Medical Services you may have received from other than [facility] providers in the past year (date may be approximate).     Assessment:   This is a routine wellness examination for Evelyne.  Hearing/Vision screen Hearing Screening - Comments:: Concerns and doesn't wear hearing aids Vision Screening - Comments:: Wear reading glasses  Dietary issues and exercise activities discussed: Current Exercise Habits: The patient does not participate in regular exercise at present, Type of exercise: walking, Time  (Minutes): 10, Frequency (Times/Week): 3, Weekly Exercise (Minutes/Week): 30, Intensity: Mild, Exercise limited by: Other - see comments (care giver for the husband)   Goals Addressed   None   Depression Screen PHQ 2/9 Scores 06/15/2021 04/07/2020 10/19/2019 07/21/2019 10/06/2018  PHQ - 2 Score 0 0 0 0 0    Fall Risk Fall Risk  06/15/2021 06/02/2021 05/16/2021 03/07/2021  Falls in the past year? 0 0 0 0  Number falls in past yr: 0 0 0 0  Injury with Fall? 0 0 0 0  Risk for fall due to : History of fall(s) History of fall(s) History of fall(s) No Fall Risks  Follow up Falls evaluation completed;Education provided;Falls prevention discussed Falls evaluation completed;Education provided;Falls prevention discussed Falls evaluation completed;Education provided;Falls prevention discussed Falls evaluation completed    FALL RISK PREVENTION PERTAINING TO THE HOME:  Any stairs in or around the home? Yes  If so, are there any without handrails? No  Home free of loose throw rugs in walkways, pet beds, electrical cords, etc? No  Adequate lighting in your home to reduce risk of falls? Yes   ASSISTIVE DEVICES UTILIZED TO PREVENT FALLS:  Life alert? No  Use of a cane, walker or w/c? No  Grab bars in the bathroom? No  Shower chair or bench in shower? No  Elevated toilet seat or a handicapped toilet? Yes   TIMED UP AND GO:  Was the test performed? No .  Length of time to ambulate 10  feet: N/A  sec.   Gait slow and steady without use of assistive device  Cognitive Function:     6CIT Screen 06/15/2021  What Year? 0 points  What month? 0 points  What time? 0 points  Count back from 20 0 points  Months in reverse 0 points  Repeat phrase 4 points  Total Score 4    Immunizations Immunization History  Administered Date(s) Administered   PFIZER(Purple Top)SARS-COV-2 Vaccination 11/11/2019, 11/13/2019, 12/08/2019   Pneumococcal Conjugate-13 06/14/2014   Pneumococcal Polysaccharide-23 10/26/2013,  03/30/2020   Tdap 12/19/2015    TDAP status: Up to date  Flu Vaccine status: Declined, Education has been provided regarding the importance of this vaccine but patient still declined. Advised may receive this vaccine at local pharmacy or Health Dept. Aware to provide a copy of the vaccination record if obtained from local pharmacy or Health Dept. Verbalized acceptance and understanding.  Pneumococcal vaccine status: Up to date  Covid-19 vaccine status: Declined, Education has been provided regarding the importance of this vaccine but patient still declined. Advised may receive this vaccine at local pharmacy or Health Dept.or vaccine clinic. Aware to provide a copy of the vaccination record if obtained from local pharmacy or Health Dept. Verbalized acceptance and understanding.  Qualifies for Shingles Vaccine? Yes   Zostavax completed Yes   Shingrix Completed?: Yes  Screening Tests Health Maintenance  Topic Date Due   COVID-19 Vaccine (4 - Booster) 04/08/2020   INFLUENZA VACCINE  Never done   Zoster Vaccines- Shingrix (1 of 2) 09/15/2021 (Originally 08/05/1983)   TETANUS/TDAP  12/18/2025   DEXA SCAN  Completed   HPV VACCINES  Aged Out    Health Maintenance  Health Maintenance Due  Topic Date Due   COVID-19 Vaccine (4 - Booster) 04/08/2020   INFLUENZA VACCINE  Never done    Colorectal cancer screening: No longer required.   Mammogram status: No longer required due to due to age .  Bone Density status: Completed 2019 . Results reflect: Bone density results: NORMAL. Repeat every 2 years.  Lung Cancer Screening: (Low Dose CT Chest recommended if Age 23-80 years, 30 pack-year currently smoking OR have quit w/in 15years.) does not qualify.   Lung Cancer Screening Referral: No   Additional Screening:  Hepatitis C Screening: does not qualify; Completed No   Vision Screening: Recommended annual ophthalmology exams for early detection of glaucoma and other disorders of the  eye. Is the patient up to date with their annual eye exam?  Yes  Who is the provider or what is the name of the office in which the patient attends annual eye exams? Dr.Oman  If pt is not established with a provider, would they like to be referred to a provider to establish care? No .   Dental Screening: Recommended annual dental exams for proper oral hygiene  Community Resource Referral / Chronic Care Management: CRR required this visit?  No   CCM required this visit?  No      Plan:     I have personally reviewed and noted the following in the patient's chart:   Medical and social history Use of alcohol, tobacco or illicit drugs  Current medications and supplements including opioid prescriptions. Patient is not currently taking opioid prescriptions. Functional ability and status Nutritional status Physical activity Advanced directives List of other physicians Hospitalizations, surgeries, and ER visits in previous 12 months Vitals Screenings to include cognitive, depression, and falls Referrals and appointments  In addition, I have reviewed and discussed  with patient certain preventive protocols, quality metrics, and best practice recommendations. A written personalized care plan for preventive services as well as general preventive health recommendations were provided to patient.     Sandrea Hughs, NP   06/15/2021   Nurse Notes: declined Influenza vaccine and 2 nd COVID-19 vaccine

## 2021-06-16 ENCOUNTER — Ambulatory Visit: Payer: HMO | Admitting: Podiatry

## 2021-06-16 DIAGNOSIS — E782 Mixed hyperlipidemia: Secondary | ICD-10-CM | POA: Diagnosis not present

## 2021-06-16 DIAGNOSIS — N183 Chronic kidney disease, stage 3 unspecified: Secondary | ICD-10-CM | POA: Diagnosis not present

## 2021-06-16 DIAGNOSIS — I5032 Chronic diastolic (congestive) heart failure: Secondary | ICD-10-CM | POA: Diagnosis not present

## 2021-06-16 DIAGNOSIS — I130 Hypertensive heart and chronic kidney disease with heart failure and stage 1 through stage 4 chronic kidney disease, or unspecified chronic kidney disease: Secondary | ICD-10-CM | POA: Diagnosis not present

## 2021-06-20 ENCOUNTER — Emergency Department (HOSPITAL_BASED_OUTPATIENT_CLINIC_OR_DEPARTMENT_OTHER): Payer: HMO | Admitting: Radiology

## 2021-06-20 ENCOUNTER — Encounter (HOSPITAL_BASED_OUTPATIENT_CLINIC_OR_DEPARTMENT_OTHER): Payer: Self-pay | Admitting: Emergency Medicine

## 2021-06-20 ENCOUNTER — Emergency Department (HOSPITAL_BASED_OUTPATIENT_CLINIC_OR_DEPARTMENT_OTHER): Payer: HMO

## 2021-06-20 ENCOUNTER — Other Ambulatory Visit: Payer: Self-pay

## 2021-06-20 ENCOUNTER — Emergency Department (HOSPITAL_BASED_OUTPATIENT_CLINIC_OR_DEPARTMENT_OTHER)
Admission: EM | Admit: 2021-06-20 | Discharge: 2021-06-21 | Disposition: A | Discharge status: Home / Self Care | Payer: HMO | Attending: Emergency Medicine | Admitting: Emergency Medicine

## 2021-06-20 ENCOUNTER — Telehealth: Payer: Self-pay | Admitting: *Deleted

## 2021-06-20 DIAGNOSIS — I5032 Chronic diastolic (congestive) heart failure: Secondary | ICD-10-CM | POA: Diagnosis not present

## 2021-06-20 DIAGNOSIS — I129 Hypertensive chronic kidney disease with stage 1 through stage 4 chronic kidney disease, or unspecified chronic kidney disease: Secondary | ICD-10-CM | POA: Insufficient documentation

## 2021-06-20 DIAGNOSIS — M7989 Other specified soft tissue disorders: Secondary | ICD-10-CM | POA: Diagnosis not present

## 2021-06-20 DIAGNOSIS — Z91040 Latex allergy status: Secondary | ICD-10-CM | POA: Insufficient documentation

## 2021-06-20 DIAGNOSIS — H02849 Edema of unspecified eye, unspecified eyelid: Secondary | ICD-10-CM | POA: Diagnosis not present

## 2021-06-20 DIAGNOSIS — R519 Headache, unspecified: Secondary | ICD-10-CM | POA: Insufficient documentation

## 2021-06-20 DIAGNOSIS — Z79899 Other long term (current) drug therapy: Secondary | ICD-10-CM | POA: Diagnosis not present

## 2021-06-20 DIAGNOSIS — H5711 Ocular pain, right eye: Secondary | ICD-10-CM | POA: Insufficient documentation

## 2021-06-20 DIAGNOSIS — I2510 Atherosclerotic heart disease of native coronary artery without angina pectoris: Secondary | ICD-10-CM | POA: Insufficient documentation

## 2021-06-20 DIAGNOSIS — E039 Hypothyroidism, unspecified: Secondary | ICD-10-CM | POA: Diagnosis not present

## 2021-06-20 DIAGNOSIS — N183 Chronic kidney disease, stage 3 unspecified: Secondary | ICD-10-CM | POA: Diagnosis not present

## 2021-06-20 DIAGNOSIS — S0591XA Unspecified injury of right eye and orbit, initial encounter: Secondary | ICD-10-CM | POA: Diagnosis not present

## 2021-06-20 DIAGNOSIS — S82892A Other fracture of left lower leg, initial encounter for closed fracture: Secondary | ICD-10-CM

## 2021-06-20 DIAGNOSIS — Z85820 Personal history of malignant melanoma of skin: Secondary | ICD-10-CM | POA: Insufficient documentation

## 2021-06-20 DIAGNOSIS — Z7982 Long term (current) use of aspirin: Secondary | ICD-10-CM | POA: Diagnosis not present

## 2021-06-20 DIAGNOSIS — M1712 Unilateral primary osteoarthritis, left knee: Secondary | ICD-10-CM | POA: Diagnosis not present

## 2021-06-20 DIAGNOSIS — Z7951 Long term (current) use of inhaled steroids: Secondary | ICD-10-CM | POA: Insufficient documentation

## 2021-06-20 DIAGNOSIS — S0993XA Unspecified injury of face, initial encounter: Secondary | ICD-10-CM | POA: Diagnosis not present

## 2021-06-20 DIAGNOSIS — J45909 Unspecified asthma, uncomplicated: Secondary | ICD-10-CM | POA: Insufficient documentation

## 2021-06-20 DIAGNOSIS — S0990XA Unspecified injury of head, initial encounter: Secondary | ICD-10-CM | POA: Diagnosis not present

## 2021-06-20 DIAGNOSIS — M25572 Pain in left ankle and joints of left foot: Secondary | ICD-10-CM | POA: Diagnosis not present

## 2021-06-20 DIAGNOSIS — S82832A Other fracture of upper and lower end of left fibula, initial encounter for closed fracture: Secondary | ICD-10-CM | POA: Diagnosis not present

## 2021-06-20 MED ORDER — ACETAMINOPHEN 500 MG PO TABS
1000.0000 mg | ORAL_TABLET | Freq: Once | ORAL | Status: AC
  Administered 2021-06-20: 1000 mg via ORAL
  Filled 2021-06-20: qty 2

## 2021-06-20 NOTE — ED Triage Notes (Signed)
Pt via pv from home after falling today at home. Pt states she had mechanical fall. Denies LOC.  Pt called her pcp and was told to come for a CT scan. Pt has black eye. Pt not on blood thinners, does take an aspirin. Pt alert & oriented x 4, nad noted.

## 2021-06-20 NOTE — ED Provider Notes (Signed)
[facility] EMERGENCY DEPT Provider Note   CSN: [id number] Arrival date & time: 06/20/21  2031     History Chief Complaint  Patient presents with   Jill Preston    Jill Preston is a 87 y.o. female.  The history is provided by the patient.  Fall This is a new problem. The current episode started 6 to 12 hours ago. The problem occurs constantly. The problem has been resolved. Pertinent negatives include no chest pain, no abdominal pain, no headaches and no shortness of breath. Associated symptoms comments: R black eye and minimal L ankle pain . She has tried a cold compress for the symptoms. The treatment provided mild relief.      Past Medical History:  Diagnosis Date   Arthritis    right knee   Cancer ([place]) yrs ago   skin cancer removed from face   Chronic diastolic CHF (congestive heart failure) (HCC)    Chronic venous insufficiency    LEA VENOUS, 10/17/2011 - mild reflux in bilateral common femoral veins   CKD (chronic kidney disease), stage III ([place])    Coronary artery disease    a. s/p PCI/BMS to prox LAD and balloon angioplasty to mLAD with suboptimal result in 2000. b. Abnl nuc 2012, cath 09/2011 - showed that the overall territory of potential ischemia was small and attributable to a moderately diseased small second diagonal artery. Med rx.   Dyspnea    with activity   Headache    History of blood transfusion 2017   Hyperlipidemia    Hypertension    Hypertensive heart disease    Hypothyroidism    Obesity    Pneumonia    several times   Stroke [facility])    pt. states she had "light stroke" in sept. 1980   Urinary incontinence     Patient Active Problem List   Diagnosis Date Noted   Hordeolum externum (stye) 05/16/2021   Chronic otitis media with serous effusion 05/16/2021   Thrombocytopenia ([place]) 04/17/2021   Peroneal tendon tear, right, sequela    PAD (peripheral artery disease) ([place]) 08/01/2018   Herniated intervertebral disc of lumbar spine  06/02/2018   Tear of left rotator cuff 10/29/2017   Bilateral leg pain 01/27/2017   Cervical spine degeneration 10/23/2015   Hypersomnolence 10/23/2015   S/P IVC filter 02/15/2015   Angina pectoris ([place]) 01/26/2015   CAD in native artery    Ischemic chest pain (HCC)    Edema of left lower extremity 09/16/2014   Acute thromboembolism of deep veins of lower extremity ([place]) 09/14/2014   Acute deep vein thrombosis of left lower extremity ([place]) 09/14/2014   Anemia associated with acute blood loss 09/14/2014   CHF (congestive heart failure) ([place]) 09/05/2014   Subclavian artery stenosis, left (HCC) 08/04/2014   Chronic diastolic CHF (congestive heart failure) ([place]) 07/16/2014   Essential hypertension 07/08/2014   CKD (chronic kidney disease), stage III ([place])    Hypertensive heart disease    Obesity, Class II, BMI 35-39.9, with comorbidity    Chronic venous insufficiency    Chronic diastolic heart failure ([employer]) 07/04/2014   Metatarsal deformity 06/24/2013   CAD - LAD stent 12/00. Patent '02 and 1/13, moderate D2 managed medically 02/26/2013   Dyslipidemia 02/26/2013   DJD (degenerative joint disease)- back 02/26/2013   Vaginal candidiasis 10/23/2012   Asthma 08/06/2012   Hyperlipidemia 24/40/1027   Lichen sclerosus 25/36/6440   Rectocele 06/03/2012   Recurrent UTI 06/03/2012   Urge urinary incontinence 06/03/2012   Voiding  dysfunction 06/03/2012   Hypothyroidism 07/23/2011   Accelerated hypertension 07/23/2011    Past Surgical History:  Procedure Laterality Date   ABDOMINAL HYSTERECTOMY  1970's   complete   ANKLE ARTHROSCOPY Right 07/10/2017   Procedure: ANKLE ARTHROSCOPY;  Surgeon: [name], DPM;  Location: [facility];  Service: Podiatry;  Laterality: Right;   BACK SURGERY  07/26/2016   cervical neck surgery,  surgical center   BLADDER SURGERY  2010   WITH MESH  bladder tach   bunion removal surgery Bilateral 15 yrs ago   CARDIAC CATHETERIZATION Left 09/25/2011    Medical management   CARDIAC CATHETERIZATION Left 03/25/2001   Normal LV function, LAD residual narrowing of less than 10%, normal ramus intermediate, circumflex, and RCA,    CARDIAC CATHETERIZATION  09/04/1999   LAD, 3x3mm Tetra stent resulting in a reduction of the 80% stenosis to 0% residual   CARDIAC CATHETERIZATION N/A 01/26/2015   Procedure: Right/Left Heart Cath and Coronary Angiography;  Surgeon: Troy Sine, MD;  Location: [facility];  Service: Cardiovascular;  Laterality: N/A;   CARDIAC CATHETERIZATION N/A 01/27/2015   Procedure: Intravascular Pressure Wire/FFR Study;  Surgeon: Burnell Blanks, MD;  Location: [facility];  Service: Cardiovascular;  Laterality: N/A;   CARDIAC CATHETERIZATION N/A 01/27/2015   Procedure: Right Heart Cath;  Surgeon: Burnell Blanks, MD;  Location: [facility];  Service: Cardiovascular;  Laterality: N/A;   CHOLECYSTECTOMY     COLONOSCOPY WITH PROPOFOL N/A 03/07/2017   Procedure: COLONOSCOPY WITH PROPOFOL;  Surgeon: Juanita Craver, MD;  Location: [facility];  Service: Endoscopy;  Laterality: N/A;   CORONARY STENT PLACEMENT     LAD x 1   ganglion cyst removal  yrs ago   x 2   ILIAC VEIN ANGIOPLASTY / STENTING  02/15/2015   INTRAVASCULAR PRESSURE WIRE/FFR STUDY N/A 04/05/2017   Procedure: Intravascular Pressure Wire/FFR Study;  Surgeon: Martinique, Peter M, MD;  Location: [facility];  Service: Cardiovascular;  Laterality: N/A;   IVC FILTER INSERTION  2016   IVC FILTER REMOVAL  02/15/2015   at [facility] CATH AND CORONARY ANGIOGRAPHY N/A 04/05/2017   Procedure: Left Heart Cath and Coronary Angiography;  Surgeon: Martinique, Peter M, MD;  Location: [facility];  Service: Cardiovascular;  Laterality: N/A;   LEFT HEART CATHETERIZATION WITH CORONARY ANGIOGRAM N/A 09/25/2011   Procedure: LEFT HEART CATHETERIZATION WITH CORONARY ANGIOGRAM;  Surgeon: Sanda Klein, MD;  Location: [facility];  Service: Cardiovascular;   Laterality: N/A;   multiple bladder surgeries to remove mesh     ROTATOR CUFF REPAIR Right    stent to groin Left 08/2014   left leg   TENDON REPAIR Right 07/10/2017   Procedure: RIGHT PERONEAL TENDON REPAIR;  Surgeon: [name], DPM;  Location: [facility];  Service: Podiatry;  Laterality: Right;   TENDON REPAIR Right 05/27/2019   Procedure: PERONEAL TENDON REPAIR x2;  Surgeon: [name], DPM;  Location: [facility];  Service: Podiatry;  Laterality: Right;     OB History   No obstetric history on file.     Family History  Problem Relation Age of Onset   Cancer Mother    Heart disease Father    Heart disease Sister    High blood pressure Sister    Heart disease Sister    Cancer Sister    Heart disease Brother    Diabetes Brother    Brain cancer Brother  High blood pressure Brother    Heart disease Brother    Heart attack Brother    Heart disease Brother    High blood pressure Brother     Social History   Tobacco Use   Smoking status: Never   Smokeless tobacco: Never  Vaping Use   Vaping Use: Never used  Substance Use Topics   Alcohol use: No   Drug use: No    Home Medications Prior to Admission medications   Medication Sig Start Date End Date Taking? Authorizing Provider  amLODipine (NORVASC) 2.5 MG tablet Take 1 tablet (2.5 mg total) by mouth daily. 11/11/20   Croitoru, Mihai, MD  aspirin EC 81 MG tablet Take 1 tablet (81 mg total) by mouth every other day. 03/17/21   Croitoru, Mihai, MD  bacitracin-polymyxin b (POLYSPORIN) ophthalmic ointment Place 1 application into the right eye every 12 (twelve) hours. 05/19/21   Lauree Chandler, NP  cholecalciferol (VITAMIN D3) 25 MCG (1000 UNIT) tablet Take 1,000 Units by mouth daily.     [provider]  Docusate Sodium 100 MG capsule Take 200 mg by mouth daily at 8 pm. If no BM patient will also take 2 in the pm    [provider]  ezetimibe (ZETIA) 10 MG tablet TAKE 1 TABLET(10 MG) BY MOUTH DAILY  02/27/21   Croitoru, Mihai, MD  fluticasone (FLONASE) 50 MCG/ACT nasal spray Place 2 sprays into both nostrils daily. 05/16/21   Wardell Honour, MD  furosemide (LASIX) 80 MG tablet Take 1 tablet (80 mg total) by mouth daily. TAKE AN EXTRA 40mg  (half tablet) IN THE AFTERNOON ONLY IF WEIGHT IS OVER 185lbs. 05/26/21   Croitoru, Mihai, MD  isosorbide dinitrate (ISORDIL) 30 MG tablet Take 60 mg by mouth daily at 12 noon. In the morning    [provider]  KLOR-CON M20 20 MEQ tablet Take 1 tablet (20 mEq total) by mouth See admin instructions. Take one tablet (20 meq) by mouth every morning and with each afternoon dose of lasix 12/28/20   Croitoru, Mihai, MD  levothyroxine (SYNTHROID, LEVOTHROID) 112 MCG tablet Take 112 mcg by mouth daily before breakfast.  05/15/18   [provider]  OVER THE COUNTER MEDICATION Take 2 tablets by mouth daily. "Focus factor"    [provider]  pravastatin (PRAVACHOL) 80 MG tablet Take 80 mg by mouth at bedtime.    [provider]    Allergies    Atorvastatin, Penicillins, Shellfish allergy, Aminophylline, Iodinated diagnostic agents, Latex, Oxybutynin chloride, Vancomycin, Adhesive [tape], Betadine [povidone iodine], Ciprofloxacin, Codeine, and Ranolazine  Review of Systems   Review of Systems  Constitutional:  Negative for fever.  HENT:  Negative for facial swelling.   Eyes:  Negative for redness.  Respiratory:  Negative for shortness of breath.   Cardiovascular:  Negative for chest pain.  Gastrointestinal:  Negative for abdominal pain.  Genitourinary:  Negative for difficulty urinating.  Musculoskeletal:  Negative for neck pain.  Skin:  Negative for rash.  Neurological:  Negative for seizures, syncope, weakness and headaches.  Psychiatric/Behavioral:  Negative for agitation.   All other systems reviewed and are negative.  Physical Exam Updated Vital Signs BP (!) 156/90 (BP Location: Left Arm)   Pulse 67   Temp 98 F (36.7  C) (Oral)   Resp 20   Ht 5\' 6"  (1.676 m)   Wt 87.1 kg   SpO2 95%   BMI 30.99 kg/m   Physical Exam Vitals and nursing note reviewed.  Constitutional:      General: She is not in acute distress.    Appearance: Normal appearance.  HENT:     Head: Normocephalic.   Eyes:     Extraocular Movements: Extraocular movements intact.     Conjunctiva/sclera: Conjunctivae normal.     Pupils: Pupils are equal, round, and reactive to light.  Cardiovascular:     Rate and Rhythm: Normal rate and regular rhythm.     Pulses: Normal pulses.     Heart sounds: Normal heart sounds.  Pulmonary:     Effort: Pulmonary effort is normal.     Breath sounds: Normal breath sounds.  Abdominal:     General: Abdomen is flat. Bowel sounds are normal.     Palpations: Abdomen is soft.     Tenderness: There is no abdominal tenderness. There is no guarding.  Musculoskeletal:        General: Normal range of motion.     Cervical back: Normal range of motion and neck supple.     Right ankle:     Right Achilles Tendon: Normal.     Left ankle: No swelling, deformity, ecchymosis or lacerations. Normal range of motion. Anterior drawer test negative. Normal pulse.     Left Achilles Tendon: Normal.     Right foot: Normal.     Left foot: Normal.  Skin:    General: Skin is warm and dry.     Capillary Refill: Capillary refill takes less than 2 seconds.  Neurological:     General: No focal deficit present.     Mental Status: She is alert and oriented to person, place, and time.     Deep Tendon Reflexes: Reflexes normal.  Psychiatric:        Mood and Affect: Mood normal.        Behavior: Behavior normal.    ED Results / Procedures / Treatments   Labs (all labs ordered are listed, but only abnormal results are displayed) Labs Reviewed - No data to display  EKG None  Radiology DG Ankle Complete Left  Result Date: 06/20/2021 CLINICAL DATA:  Recent fall with ankle pain, initial encounter EXAM: LEFT ANKLE  COMPLETE - 3+ VIEW COMPARISON:  None. FINDINGS: Generalized soft tissue swelling is noted about the ankle. And well corticated bony density is noted between the medial malleolus and talus likely related to prior trauma and nonunion. Bony density is noted along the dorsal aspect of the talus consistent with small avulsion. Similar findings are noted along tarsal navicular bone. Calcaneal spurring is seen. No other focal abnormality is noted. IMPRESSION: Small avulsion from the talus and navicular dorsally. No other definitive fracture is seen. Electronically Signed   By: Inez Catalina M.D.   On: 06/20/2021 22:23   CT Head Wo Contrast  Result Date: 06/20/2021 CLINICAL DATA:  Facial injury after fall. EXAM: CT HEAD WITHOUT CONTRAST CT MAXILLOFACIAL WITHOUT CONTRAST CT CERVICAL SPINE WITHOUT CONTRAST TECHNIQUE: Multidetector CT imaging of the head, cervical spine, and maxillofacial structures were performed using the standard protocol without intravenous contrast. Multiplanar CT image reconstructions of the cervical spine and maxillofacial structures were also generated. COMPARISON:  None. FINDINGS: CT HEAD FINDINGS Brain: No evidence of acute infarction, hemorrhage, hydrocephalus, extra-axial collection or mass lesion/mass effect. There is mild periventricular white matter hypodensity, likely chronic small vessel ischemic change. Vascular: No hyperdense vessel or unexpected calcification. Skull: Normal. Negative for fracture or focal lesion. Other: None. CT MAXILLOFACIAL FINDINGS Osseous: No fracture or mandibular dislocation. No destructive process. Orbits:  There is preseptal right orbital soft tissue swelling and hematoma. Postseptal orbital soft tissues are within normal limits. Globes are intact bilaterally. There is no foreign body. Sinuses: Clear. Soft tissues: Negative. CT CERVICAL SPINE FINDINGS Alignment: There is trace anterolisthesis at C4-C5 which is likely degenerative. Alignment is otherwise anatomic.  Skull base and vertebrae: There is anterior fusion plate at C5, C6 and L9-7 with incomplete incorporated disc space material. No hardware loosening. No acute fracture. No primary bone lesion. Soft tissues and spinal canal: No prevertebral fluid or swelling. No visible canal hematoma. Disc levels: There are degenerative changes of facet joints at C3-C4 and C4-C5. There is severe bilateral neural foraminal stenosis at C3-C4. No significant central canal stenosis at any level. Upper chest: There is scarring in the lung apices. Other: None. IMPRESSION: 1. No acute intracranial process. 2. No acute fracture or traumatic subluxation of the cervical spine. 3. No acute facial fracture. 4. Right periorbital swelling and hematoma. 5. C5-C7 anterior fusion. Electronically Signed   By: Ronney Asters M.D.   On: 06/20/2021 22:29   CT Cervical Spine Wo Contrast  Result Date: 06/20/2021 CLINICAL DATA:  Facial injury after fall. EXAM: CT HEAD WITHOUT CONTRAST CT MAXILLOFACIAL WITHOUT CONTRAST CT CERVICAL SPINE WITHOUT CONTRAST TECHNIQUE: Multidetector CT imaging of the head, cervical spine, and maxillofacial structures were performed using the standard protocol without intravenous contrast. Multiplanar CT image reconstructions of the cervical spine and maxillofacial structures were also generated. COMPARISON:  None. FINDINGS: CT HEAD FINDINGS Brain: No evidence of acute infarction, hemorrhage, hydrocephalus, extra-axial collection or mass lesion/mass effect. There is mild periventricular white matter hypodensity, likely chronic small vessel ischemic change. Vascular: No hyperdense vessel or unexpected calcification. Skull: Normal. Negative for fracture or focal lesion. Other: None. CT MAXILLOFACIAL FINDINGS Osseous: No fracture or mandibular dislocation. No destructive process. Orbits: There is preseptal right orbital soft tissue swelling and hematoma. Postseptal orbital soft tissues are within normal limits. Globes are intact  bilaterally. There is no foreign body. Sinuses: Clear. Soft tissues: Negative. CT CERVICAL SPINE FINDINGS Alignment: There is trace anterolisthesis at C4-C5 which is likely degenerative. Alignment is otherwise anatomic. Skull base and vertebrae: There is anterior fusion plate at C5, C6 and Q7-3 with incomplete incorporated disc space material. No hardware loosening. No acute fracture. No primary bone lesion. Soft tissues and spinal canal: No prevertebral fluid or swelling. No visible canal hematoma. Disc levels: There are degenerative changes of facet joints at C3-C4 and C4-C5. There is severe bilateral neural foraminal stenosis at C3-C4. No significant central canal stenosis at any level. Upper chest: There is scarring in the lung apices. Other: None. IMPRESSION: 1. No acute intracranial process. 2. No acute fracture or traumatic subluxation of the cervical spine. 3. No acute facial fracture. 4. Right periorbital swelling and hematoma. 5. C5-C7 anterior fusion. Electronically Signed   By: Ronney Asters M.D.   On: 06/20/2021 22:29   DG Knee Complete 4 Views Left  Result Date: 06/20/2021 CLINICAL DATA:  Recent fall with knee pain, initial encounter EXAM: LEFT KNEE - COMPLETE 4+ VIEW COMPARISON:  None. FINDINGS: Medial joint space narrowing is noted. No acute fracture or dislocation is seen. No joint effusion is noted. No soft tissue abnormality is seen. IMPRESSION: Mild degenerative change without acute abnormality. Electronically Signed   By: Inez Catalina M.D.   On: 06/20/2021 22:23   CT Maxillofacial WO CM  Result Date: 06/20/2021 CLINICAL DATA:  Facial injury after fall. EXAM: CT HEAD WITHOUT CONTRAST CT MAXILLOFACIAL WITHOUT  CONTRAST CT CERVICAL SPINE WITHOUT CONTRAST TECHNIQUE: Multidetector CT imaging of the head, cervical spine, and maxillofacial structures were performed using the standard protocol without intravenous contrast. Multiplanar CT image reconstructions of the cervical spine and  maxillofacial structures were also generated. COMPARISON:  None. FINDINGS: CT HEAD FINDINGS Brain: No evidence of acute infarction, hemorrhage, hydrocephalus, extra-axial collection or mass lesion/mass effect. There is mild periventricular white matter hypodensity, likely chronic small vessel ischemic change. Vascular: No hyperdense vessel or unexpected calcification. Skull: Normal. Negative for fracture or focal lesion. Other: None. CT MAXILLOFACIAL FINDINGS Osseous: No fracture or mandibular dislocation. No destructive process. Orbits: There is preseptal right orbital soft tissue swelling and hematoma. Postseptal orbital soft tissues are within normal limits. Globes are intact bilaterally. There is no foreign body. Sinuses: Clear. Soft tissues: Negative. CT CERVICAL SPINE FINDINGS Alignment: There is trace anterolisthesis at C4-C5 which is likely degenerative. Alignment is otherwise anatomic. Skull base and vertebrae: There is anterior fusion plate at C5, C6 and T9-0 with incomplete incorporated disc space material. No hardware loosening. No acute fracture. No primary bone lesion. Soft tissues and spinal canal: No prevertebral fluid or swelling. No visible canal hematoma. Disc levels: There are degenerative changes of facet joints at C3-C4 and C4-C5. There is severe bilateral neural foraminal stenosis at C3-C4. No significant central canal stenosis at any level. Upper chest: There is scarring in the lung apices. Other: None. IMPRESSION: 1. No acute intracranial process. 2. No acute fracture or traumatic subluxation of the cervical spine. 3. No acute facial fracture. 4. Right periorbital swelling and hematoma. 5. C5-C7 anterior fusion. Electronically Signed   By: Ronney Asters M.D.   On: 06/20/2021 22:29    Procedures Procedures   Medications Ordered in ED Medications  acetaminophen (TYLENOL) tablet 1,000 mg (has no administration in time range)    ED Course  I have reviewed the triage vital signs and  the nursing notes.  Pertinent labs & imaging results that were available during my care of the patient were reviewed by me and considered in my medical decision making (see chart for details).   Placed in splint, ice elevation and tylenol and motrin and follow up with orthopedics.     LOVE CHOWNING was evaluated in Emergency Department on 06/20/2021 for the symptoms described in the history of present illness. She was evaluated in the context of the global COVID-19 pandemic, which necessitated consideration that the patient might be at risk for infection with the SARS-CoV-2 virus that causes COVID-19. Institutional protocols and algorithms that pertain to the evaluation of patients at risk for COVID-19 are in a state of rapid change based on information released by regulatory bodies including the CDC and federal and state organizations. These policies and algorithms were followed during the patient's care in the ED.  Final Clinical Impression(s) / ED Diagnoses Final diagnoses:  None   Return for intractable cough, coughing up blood, fevers > 100.4 unrelieved by medication, shortness of breath, intractable vomiting, chest pain, shortness of breath, weakness, numbness, changes in speech, facial asymmetry, abdominal pain, passing out, Inability to tolerate liquids or food, cough, altered mental status or any concerns. No signs of systemic illness or infection. The patient is nontoxic-appearing on exam and vital signs are within normal limits.  I have reviewed the triage vital signs and the nursing notes. Pertinent labs & imaging results that were available during my care of the patient were reviewed by me and considered in my medical decision making (see chart for  details). After history, exam, and medical workup I feel the patient has been appropriately medically screened and is safe for discharge home. Pertinent diagnoses were discussed with the patient. Patient was given return precautions.    Rx  / DC Orders ED Discharge Orders     None        Zen Felling, MD 06/20/21 [id number]

## 2021-06-20 NOTE — Telephone Encounter (Signed)
Patient called and stated that she just fell in the bedroom and hit right above the Right eye and now has a Hen Egg size knot. Didn't hit anything but the floor.  Stated that it hurts to put ice on it. No Loss of Consciences. No other symptoms or pains.   Advised her to go to Urgent Care/Med Center to be checked/evaluated and she agreed.   She wanted to let you know.

## 2021-06-21 NOTE — Telephone Encounter (Signed)
I agree to get evaluated in urgent care or ED.

## 2021-06-21 NOTE — ED Notes (Signed)
Splint applied by this RN. Patient comfortable Post-Application. Peripheral Vasculature intact after application. Patient reaffirms conformability and instructed on Splint Care.

## 2021-06-22 DIAGNOSIS — S93601A Unspecified sprain of right foot, initial encounter: Secondary | ICD-10-CM | POA: Diagnosis not present

## 2021-06-26 ENCOUNTER — Ambulatory Visit
Admission: RE | Admit: 2021-06-26 | Discharge: 2021-06-26 | Disposition: A | Discharge status: Home / Self Care | Payer: HMO | Source: Ambulatory Visit | Attending: Family | Admitting: Family

## 2021-06-26 ENCOUNTER — Ambulatory Visit (INDEPENDENT_AMBULATORY_CARE_PROVIDER_SITE_OTHER): Payer: HMO | Admitting: Family

## 2021-06-26 ENCOUNTER — Encounter: Payer: Self-pay | Admitting: Family

## 2021-06-26 ENCOUNTER — Other Ambulatory Visit: Payer: Self-pay

## 2021-06-26 VITALS — BP 132/64 | HR 74 | Temp 97.5°F | Resp 16 | Ht 66.0 in | Wt 189.4 lb

## 2021-06-26 DIAGNOSIS — M79644 Pain in right finger(s): Secondary | ICD-10-CM

## 2021-06-26 DIAGNOSIS — H538 Other visual disturbances: Secondary | ICD-10-CM | POA: Diagnosis not present

## 2021-06-26 DIAGNOSIS — W19XXXD Unspecified fall, subsequent encounter: Secondary | ICD-10-CM

## 2021-06-26 DIAGNOSIS — M25562 Pain in left knee: Secondary | ICD-10-CM

## 2021-06-26 DIAGNOSIS — I10 Essential (primary) hypertension: Secondary | ICD-10-CM | POA: Diagnosis not present

## 2021-06-26 DIAGNOSIS — M7989 Other specified soft tissue disorders: Secondary | ICD-10-CM | POA: Diagnosis not present

## 2021-06-26 DIAGNOSIS — M19041 Primary osteoarthritis, right hand: Secondary | ICD-10-CM | POA: Diagnosis not present

## 2021-06-26 DIAGNOSIS — D509 Iron deficiency anemia, unspecified: Secondary | ICD-10-CM | POA: Diagnosis not present

## 2021-06-26 LAB — CBC WITH DIFFERENTIAL/PLATELET
Absolute Monocytes: 328 cells/uL (ref 200–950)
Basophils Absolute: 40 cells/uL (ref 0–200)
Basophils Relative: 1 %
Eosinophils Absolute: 188 cells/uL (ref 15–500)
Eosinophils Relative: 4.7 %
HCT: 44.7 % (ref 35.0–45.0)
Hemoglobin: 14.8 g/dL (ref 11.7–15.5)
Lymphs Abs: 964 cells/uL (ref 850–3900)
MCH: 32.6 pg (ref 27.0–33.0)
MCHC: 33.1 g/dL (ref 32.0–36.0)
MCV: 98.5 fL (ref 80.0–100.0)
MPV: 10.8 fL (ref 7.5–12.5)
Monocytes Relative: 8.2 %
Neutro Abs: 2480 cells/uL (ref 1500–7800)
Neutrophils Relative %: 62 %
Platelets: 115 10*3/uL — ABNORMAL LOW (ref 140–400)
RBC: 4.54 10*6/uL (ref 3.80–5.10)
RDW: 12.1 % (ref 11.0–15.0)
Total Lymphocyte: 24.1 %
WBC: 4 10*3/uL (ref 3.8–10.8)

## 2021-06-26 MED ORDER — ACETAMINOPHEN 500 MG PO TABS: 500.0000 mg | ORAL_TABLET | Freq: Four times a day (QID) | ORAL | 0 refills | Status: DC | PRN

## 2021-06-26 NOTE — Patient Instructions (Signed)
-   Please - Please get right hand and left knee X-ray at [place] at [facility] then will call you with results.  - Take over the counter Extra strength 500 mg tablet one by mouth every 6 hours as needed for pain    - urgent referral placed  to see Ophthalmology for right eye blurry vision.Specialist office will call you to schedule appointment.

## 2021-06-26 NOTE — Progress Notes (Signed)
Provider: Marlowe Sax FNP-C  Laverda Stribling, [name], NP  Patient Care Team: Felcia Huebert, [name], NP as PCP - General (Family Medicine) Croitoru, Dani Gobble, MD as PCP - Cardiology (Cardiology) Sanda Klein, MD as Consulting Physician (Cardiology) Druscilla Brownie, MD as Referring Physician (Dermatology) [name], DPM as Consulting Physician (Podiatry) [name], [name], Traer (Optometry) [name], MD as Referring Physician (Gynecology)  Extended Emergency Contact Information Primary Emergency Contact: Foody,Lewis "Meribeth Mattes [place] Phone: [phone] Mobile Phone: [phone] Relation: Son Secondary Emergency Contact: [facility] Phone: [phone] Relation: Friend  Code Status:  full Code  Goals of care: Advanced Directive information Advanced Directives 06/26/2021  Does Patient Have a Medical Advance Directive? Yes  Type of Advance Directive Living will  Does patient want to make changes to medical advance directive? No - Patient declined  Copy of [place] in Chart? -  Would patient like information on creating a medical advance directive? -     Chief Complaint  Patient presents with   Acute Visit    Patient complains of right knee/hand pain after fall.     HPI:  Pt is a 87 y.o. female seen today for an acute visit for evaluation of left knee pain and right ring and 5 th finger pain post fall 06/20/2021. States was evaluation in ED 06/20/2021 but did not have pain on fingers and left knee then.Had bruise on right eye brow and right face. Right eye vision feels blurry and " seems like something is in there".   Past Medical History:  Diagnosis Date   Arthritis    right knee   Cancer ([place]) yrs ago   skin cancer removed from face   Chronic diastolic CHF (congestive heart failure) (HCC)    Chronic venous insufficiency    LEA VENOUS, 10/17/2011 - mild reflux in bilateral common femoral  veins   CKD (chronic kidney disease), stage III ([place])    Coronary artery disease    a. s/p PCI/BMS to prox LAD and balloon angioplasty to mLAD with suboptimal result in 2000. b. Abnl nuc 2012, cath 09/2011 - showed that the overall territory of potential ischemia was small and attributable to a moderately diseased small second diagonal artery. Med rx.   Dyspnea    with activity   Headache    History of blood transfusion 2017   Hyperlipidemia    Hypertension    Hypertensive heart disease    Hypothyroidism    Obesity    Pneumonia    several times   Stroke [facility])    pt. states she had "light stroke" in sept. 1980   Urinary incontinence    Past Surgical History:  Procedure Laterality Date   ABDOMINAL HYSTERECTOMY  1970's   complete   ANKLE ARTHROSCOPY Right 07/10/2017   Procedure: ANKLE ARTHROSCOPY;  Surgeon: [name], DPM;  Location: [facility];  Service: Podiatry;  Laterality: Right;   BACK SURGERY  07/26/2016   cervical neck surgery, [facility]   BLADDER SURGERY  2010   WITH MESH  bladder tach   bunion removal surgery Bilateral 15 yrs ago   CARDIAC CATHETERIZATION Left 09/25/2011   Medical management   CARDIAC CATHETERIZATION Left 03/25/2001   Normal LV function, LAD residual narrowing of less than 10%, normal ramus intermediate, circumflex, and RCA,    CARDIAC CATHETERIZATION  09/04/1999   LAD, 3x82mm Tetra stent resulting in a reduction of  the 80% stenosis to 0% residual   CARDIAC CATHETERIZATION N/A 01/26/2015   Procedure: Right/Left Heart Cath and Coronary Angiography;  Surgeon: Troy Sine, MD;  Location: [place] [facility];  Service: Cardiovascular;  Laterality: N/A;   CARDIAC CATHETERIZATION N/A 01/27/2015   Procedure: Intravascular Pressure Wire/FFR Study;  Surgeon: Burnell Blanks, MD;  Location: [place] [facility];  Service: Cardiovascular;  Laterality: N/A;   CARDIAC CATHETERIZATION N/A 01/27/2015   Procedure: Right Heart Cath;  Surgeon:  Burnell Blanks, MD;  Location: [place] [facility];  Service: Cardiovascular;  Laterality: N/A;   CHOLECYSTECTOMY     COLONOSCOPY WITH PROPOFOL N/A 03/07/2017   Procedure: COLONOSCOPY WITH PROPOFOL;  Surgeon: Juanita Craver, MD;  Location: [facility];  Service: Endoscopy;  Laterality: N/A;   CORONARY STENT PLACEMENT     LAD x 1   ganglion cyst removal  yrs ago   x 2   ILIAC VEIN ANGIOPLASTY / STENTING  02/15/2015   INTRAVASCULAR PRESSURE WIRE/FFR STUDY N/A 04/05/2017   Procedure: Intravascular Pressure Wire/FFR Study;  Surgeon: Martinique, Peter M, MD;  Location: [place] [facility];  Service: Cardiovascular;  Laterality: N/A;   IVC FILTER INSERTION  2016   IVC FILTER REMOVAL  02/15/2015   at [place] CATH AND CORONARY ANGIOGRAPHY N/A 04/05/2017   Procedure: Left Heart Cath and Coronary Angiography;  Surgeon: Martinique, Peter M, MD;  Location: [place] [facility];  Service: Cardiovascular;  Laterality: N/A;   LEFT HEART CATHETERIZATION WITH CORONARY ANGIOGRAM N/A 09/25/2011   Procedure: LEFT HEART CATHETERIZATION WITH CORONARY ANGIOGRAM;  Surgeon: Sanda Klein, MD;  Location: [place] [facility];  Service: Cardiovascular;  Laterality: N/A;   multiple bladder surgeries to remove mesh     ROTATOR CUFF REPAIR Right    stent to groin Left 08/2014   left leg   TENDON REPAIR Right 07/10/2017   Procedure: RIGHT PERONEAL TENDON REPAIR;  Surgeon: [name], DPM;  Location: [place];  Service: Podiatry;  Laterality: Right;   TENDON REPAIR Right 05/27/2019   Procedure: PERONEAL TENDON REPAIR x2;  Surgeon: [name], DPM;  Location: [facility];  Service: Podiatry;  Laterality: Right;    Allergies  Allergen Reactions   Atorvastatin Anaphylaxis    Patient does not remember; caused pneumonia- took her off of it per patient.    Penicillins Anaphylaxis, Hives and Swelling    Tongue swelling Did it involve swelling of the face/tongue/throat, SOB, or low BP? Yes Did it involve sudden or severe  rash/hives, skin peeling, or any reaction on the inside of your mouth or nose? Yes Did you need to seek medical attention at a hospital or doctor's office? Yes When did it last happen?  53 or 87 year old    If all above answers are "NO", may proceed with cephalosporin use.    Shellfish Allergy Anaphylaxis and Nausea And Vomiting    Severe nausea and vomiting   Aminophylline Itching, Swelling and Rash    Pt experienced burning urination, itching, redness/rash, and swelling of genitals after given this medication via IV push.   Iodinated Diagnostic Agents Swelling and Other (See Comments)    SWELLING REACTION UNSPECIFIED  fFLUSHING   Latex Other (See Comments)    Causes blisters   Oxybutynin Chloride Other (See Comments)    "blisters"   Vancomycin Hives    06/02/2018- Hives arm, back and chest   Adhesive [Tape] Rash    Paper tape is ok  Betadine [Povidone Iodine] Rash   Ciprofloxacin Hives   Codeine Nausea And Vomiting    .   Ranolazine Other (See Comments)    Constipation  .    Outpatient Encounter Medications as of 06/26/2021  Medication Sig   amLODipine (NORVASC) 2.5 MG tablet Take 1 tablet (2.5 mg total) by mouth daily.   aspirin EC 81 MG tablet Take 1 tablet (81 mg total) by mouth every other day.   bacitracin-polymyxin b (POLYSPORIN) ophthalmic ointment Place 1 application into the right eye every 12 (twelve) hours.   cholecalciferol (VITAMIN D3) 25 MCG (1000 UNIT) tablet Take 1,000 Units by mouth daily.    Docusate Sodium 100 MG capsule Take 200 mg by mouth daily at 8 pm. If no BM patient will also take 2 in the pm   ezetimibe (ZETIA) 10 MG tablet TAKE 1 TABLET(10 MG) BY MOUTH DAILY   fluticasone (FLONASE) 50 MCG/ACT nasal spray Place 2 sprays into both nostrils daily.   furosemide (LASIX) 80 MG tablet Take 1 tablet (80 mg total) by mouth daily. TAKE AN EXTRA 40mg  (half tablet) IN THE AFTERNOON ONLY IF WEIGHT IS OVER 185lbs.   isosorbide dinitrate (ISORDIL) 30 MG tablet  Take 60 mg by mouth daily at 12 noon. In the morning   KLOR-CON M20 20 MEQ tablet Take 1 tablet (20 mEq total) by mouth See admin instructions. Take one tablet (20 meq) by mouth every morning and with each afternoon dose of lasix   levothyroxine (SYNTHROID, LEVOTHROID) 112 MCG tablet Take 112 mcg by mouth daily before breakfast.    OVER THE COUNTER MEDICATION Take 2 tablets by mouth daily. "Focus factor"   pravastatin (PRAVACHOL) 80 MG tablet Take 80 mg by mouth at bedtime.   Facility-Administered Encounter Medications as of 06/26/2021  Medication   betamethasone acetate-betamethasone sodium phosphate (CELESTONE) injection 6 mg    Review of Systems  Constitutional:  Negative for appetite change, chills, fatigue, fever and unexpected weight change.  HENT:  Negative for congestion, dental problem, ear discharge, ear pain, facial swelling, hearing loss, nosebleeds, postnasal drip, rhinorrhea, sinus pressure, sinus pain, sneezing, sore throat, tinnitus and trouble swallowing.   Eyes:  Positive for redness and visual disturbance. Negative for pain, discharge and itching.       Right eye blurry since fall on 06/20/2021   Respiratory:  Negative for cough, chest tightness, shortness of breath and wheezing.   Cardiovascular:  Negative for chest pain, palpitations and leg swelling.  Gastrointestinal:  Negative for abdominal distention, abdominal pain, blood in stool, constipation, diarrhea, nausea and vomiting.  Endocrine: Negative for cold intolerance, heat intolerance, polydipsia, polyphagia and polyuria.  Genitourinary:  Negative for difficulty urinating, dysuria, flank pain, frequency and urgency.  Musculoskeletal:  Positive for arthralgias and gait problem. Negative for back pain, joint swelling, myalgias, neck pain and neck stiffness.       Right ring finger and 5th finger pain and left knee pain  Right top of foot pain.   Skin:  Negative for color change, pallor, rash and wound.       Right  facial and eye brow bruise   Neurological:  Negative for dizziness, syncope, speech difficulty, weakness, light-headedness, numbness and headaches.  Hematological:  Does not bruise/bleed easily.  Psychiatric/Behavioral:  Negative for agitation, behavioral problems, confusion, hallucinations, self-injury, sleep disturbance and suicidal ideas. The patient is not nervous/anxious.    Immunization History  Administered Date(s) Administered   PFIZER(Purple Top)SARS-COV-2 Vaccination 11/11/2019, 11/13/2019, 12/08/2019   Pneumococcal Conjugate-13 06/14/2014  Pneumococcal Polysaccharide-23 10/26/2013, 03/30/2020   Tdap 12/19/2015   Pertinent  Health Maintenance Due  Topic Date Due   INFLUENZA VACCINE  Never done   DEXA SCAN  Completed   Fall Risk  06/26/2021 06/15/2021 06/02/2021 05/16/2021 03/07/2021  Falls in the past year? 1 0 0 0 0  Number falls in past yr: 0 0 0 0 0  Injury with Fall? 1 0 0 0 0  Risk for fall due to : History of fall(s) History of fall(s) History of fall(s) History of fall(s) No Fall Risks  Follow up Falls evaluation completed Falls evaluation completed;Education provided;Falls prevention discussed Falls evaluation completed;Education provided;Falls prevention discussed Falls evaluation completed;Education provided;Falls prevention discussed Falls evaluation completed   Functional Status Survey:    Vitals:   06/26/21 0844  BP: 132/64  Pulse: 74  Resp: 16  Temp: (!) 97.5 F (36.4 C)  SpO2: 95%  Weight: 189 lb 6.4 oz (85.9 kg)  Height: 5\' 6"  (1.676 m)   Body mass index is 30.57 kg/m. Physical Exam Vitals reviewed.  Constitutional:      General: She is not in acute distress.    Appearance: Normal appearance. She is normal weight. She is not ill-appearing or diaphoretic.  HENT:     Head: Normocephalic.     Nose: Nose normal. No congestion or rhinorrhea.     Mouth/Throat:     Mouth: Mucous membranes are moist.     Pharynx: Oropharynx is clear. No oropharyngeal  exudate or posterior oropharyngeal erythema.  Eyes:     General: No scleral icterus.       Right eye: No discharge.        Left eye: No discharge.     Extraocular Movements: Extraocular movements intact.     Conjunctiva/sclera: Conjunctivae normal.     Pupils: Pupils are equal, round, and reactive to light.  Neck:     Vascular: No carotid bruit.  Cardiovascular:     Rate and Rhythm: Normal rate and regular rhythm.     Pulses: Normal pulses.     Heart sounds: Normal heart sounds. No murmur heard.   No friction rub. No gallop.  Pulmonary:     Effort: Pulmonary effort is normal. No respiratory distress.     Breath sounds: Normal breath sounds. No wheezing, rhonchi or rales.  Chest:     Chest wall: No tenderness.  Abdominal:     General: Bowel sounds are normal. There is no distension.     Palpations: Abdomen is soft. There is no mass.     Tenderness: There is no abdominal tenderness. There is no right CVA tenderness, left CVA tenderness, guarding or rebound.  Musculoskeletal:        General: No swelling.     Right hand: Tenderness present. Decreased range of motion. Normal strength. Normal sensation. Normal capillary refill.     Left hand: Normal.     Cervical back: Normal range of motion. No rigidity or tenderness.     Right knee: Normal.     Left knee: No swelling, effusion, erythema or ecchymosis. Normal range of motion. Tenderness present.     Right lower leg: No edema.     Left lower leg: No edema.     Comments: Right ring and 5 th finger tender,swollen with limited ROM   Left knee tender to palpation without any erythema   Lymphadenopathy:     Cervical: No cervical adenopathy.  Skin:    General: Skin is warm and dry.  Coloration: Skin is not pale.     Findings: Bruising present. No erythema, lesion or rash.     Comments: Right eyebrow hematoma with purple bruise extending to right face to neck area   Neurological:     Mental Status: She is alert and oriented to  person, place, and time.     Cranial Nerves: No cranial nerve deficit.     Sensory: No sensory deficit.     Motor: No weakness.     Coordination: Coordination normal.     Gait: Gait abnormal.  Psychiatric:        Mood and Affect: Mood normal.        Speech: Speech normal.        Behavior: Behavior normal.        Thought Content: Thought content normal.        Judgment: Judgment normal.    Labs reviewed: Recent Labs    08/25/20 0845 09/16/20 1747 02/27/21 0000 04/14/21 1541  NA 140 138 145 143  K 3.1* 4.2 4.0 4.6  CL 93* 99 103 102  CO2 28 29 34* 34*  GLUCOSE 103* 98  --  89  BUN 24 15 20 17   CREATININE 0.82 0.91 0.9 0.83  CALCIUM 9.4 8.8* 9.0 10.1   Recent Labs    02/27/21 0000 04/14/21 1541  AST 40* 41  ALT 38* 24  ALKPHOS 89 90  BILITOT  --  1.0  PROT  --  6.8  ALBUMIN 3.8 3.6   Recent Labs    09/16/20 1747 02/27/21 0000 04/03/21 1233 04/14/21 1541  WBC 3.9* 4.7 4.9 6.1  NEUTROABS 1.9 2.90  --  3.9  HGB 15.5* 15.9 14.8 15.0  HCT 48.0* 48* 46.6 45.1  MCV 100.0  --  100* 99.8  PLT 91* 126* 128* 133*   Lab Results  Component Value Date   TSH 1.92 02/27/2021   Lab Results  Component Value Date   HGBA1C 6.1 (H) 01/26/2015   Lab Results  Component Value Date   CHOL 155 02/27/2021   HDL 85 (A) 02/27/2021   LDLCALC 59 02/27/2021   TRIG 57 02/27/2021   CHOLHDL 4.1 11/18/2017    Significant Diagnostic Results in last 30 days:  DG Ankle Complete Left  Result Date: 06/20/2021 CLINICAL DATA:  Recent fall with ankle pain, initial encounter EXAM: LEFT ANKLE COMPLETE - 3+ VIEW COMPARISON:  None. FINDINGS: Generalized soft tissue swelling is noted about the ankle. And well corticated bony density is noted between the medial malleolus and talus likely related to prior trauma and nonunion. Bony density is noted along the dorsal aspect of the talus consistent with small avulsion. Similar findings are noted along tarsal navicular bone. Calcaneal spurring is  seen. No other focal abnormality is noted. IMPRESSION: Small avulsion from the talus and navicular dorsally. No other definitive fracture is seen. Electronically Signed   By: Inez Catalina M.D.   On: 06/20/2021 22:23   CT Head Wo Contrast  Result Date: 06/20/2021 CLINICAL DATA:  Facial injury after fall. EXAM: CT HEAD WITHOUT CONTRAST CT MAXILLOFACIAL WITHOUT CONTRAST CT CERVICAL SPINE WITHOUT CONTRAST TECHNIQUE: Multidetector CT imaging of the head, cervical spine, and maxillofacial structures were performed using the standard protocol without intravenous contrast. Multiplanar CT image reconstructions of the cervical spine and maxillofacial structures were also generated. COMPARISON:  None. FINDINGS: CT HEAD FINDINGS Brain: No evidence of acute infarction, hemorrhage, hydrocephalus, extra-axial collection or mass lesion/mass effect. There is mild periventricular white matter hypodensity, likely  chronic small vessel ischemic change. Vascular: No hyperdense vessel or unexpected calcification. Skull: Normal. Negative for fracture or focal lesion. Other: None. CT MAXILLOFACIAL FINDINGS Osseous: No fracture or mandibular dislocation. No destructive process. Orbits: There is preseptal right orbital soft tissue swelling and hematoma. Postseptal orbital soft tissues are within normal limits. Globes are intact bilaterally. There is no foreign body. Sinuses: Clear. Soft tissues: Negative. CT CERVICAL SPINE FINDINGS Alignment: There is trace anterolisthesis at C4-C5 which is likely degenerative. Alignment is otherwise anatomic. Skull base and vertebrae: There is anterior fusion plate at C5, C6 and V4-0 with incomplete incorporated disc space material. No hardware loosening. No acute fracture. No primary bone lesion. Soft tissues and spinal canal: No prevertebral fluid or swelling. No visible canal hematoma. Disc levels: There are degenerative changes of facet joints at C3-C4 and C4-C5. There is severe bilateral neural  foraminal stenosis at C3-C4. No significant central canal stenosis at any level. Upper chest: There is scarring in the lung apices. Other: None. IMPRESSION: 1. No acute intracranial process. 2. No acute fracture or traumatic subluxation of the cervical spine. 3. No acute facial fracture. 4. Right periorbital swelling and hematoma. 5. C5-C7 anterior fusion. Electronically Signed   By: Ronney Asters M.D.   On: 06/20/2021 22:29   CT Cervical Spine Wo Contrast  Result Date: 06/20/2021 CLINICAL DATA:  Facial injury after fall. EXAM: CT HEAD WITHOUT CONTRAST CT MAXILLOFACIAL WITHOUT CONTRAST CT CERVICAL SPINE WITHOUT CONTRAST TECHNIQUE: Multidetector CT imaging of the head, cervical spine, and maxillofacial structures were performed using the standard protocol without intravenous contrast. Multiplanar CT image reconstructions of the cervical spine and maxillofacial structures were also generated. COMPARISON:  None. FINDINGS: CT HEAD FINDINGS Brain: No evidence of acute infarction, hemorrhage, hydrocephalus, extra-axial collection or mass lesion/mass effect. There is mild periventricular white matter hypodensity, likely chronic small vessel ischemic change. Vascular: No hyperdense vessel or unexpected calcification. Skull: Normal. Negative for fracture or focal lesion. Other: None. CT MAXILLOFACIAL FINDINGS Osseous: No fracture or mandibular dislocation. No destructive process. Orbits: There is preseptal right orbital soft tissue swelling and hematoma. Postseptal orbital soft tissues are within normal limits. Globes are intact bilaterally. There is no foreign body. Sinuses: Clear. Soft tissues: Negative. CT CERVICAL SPINE FINDINGS Alignment: There is trace anterolisthesis at C4-C5 which is likely degenerative. Alignment is otherwise anatomic. Skull base and vertebrae: There is anterior fusion plate at C5, C6 and G8-6 with incomplete incorporated disc space material. No hardware loosening. No acute fracture. No primary  bone lesion. Soft tissues and spinal canal: No prevertebral fluid or swelling. No visible canal hematoma. Disc levels: There are degenerative changes of facet joints at C3-C4 and C4-C5. There is severe bilateral neural foraminal stenosis at C3-C4. No significant central canal stenosis at any level. Upper chest: There is scarring in the lung apices. Other: None. IMPRESSION: 1. No acute intracranial process. 2. No acute fracture or traumatic subluxation of the cervical spine. 3. No acute facial fracture. 4. Right periorbital swelling and hematoma. 5. C5-C7 anterior fusion. Electronically Signed   By: Ronney Asters M.D.   On: 06/20/2021 22:29   DG Knee Complete 4 Views Left  Result Date: 06/20/2021 CLINICAL DATA:  Recent fall with knee pain, initial encounter EXAM: LEFT KNEE - COMPLETE 4+ VIEW COMPARISON:  None. FINDINGS: Medial joint space narrowing is noted. No acute fracture or dislocation is seen. No joint effusion is noted. No soft tissue abnormality is seen. IMPRESSION: Mild degenerative change without acute abnormality. Electronically Signed  By: Inez Catalina M.D.   On: 06/20/2021 22:23   CT Maxillofacial WO CM  Result Date: 06/20/2021 CLINICAL DATA:  Facial injury after fall. EXAM: CT HEAD WITHOUT CONTRAST CT MAXILLOFACIAL WITHOUT CONTRAST CT CERVICAL SPINE WITHOUT CONTRAST TECHNIQUE: Multidetector CT imaging of the head, cervical spine, and maxillofacial structures were performed using the standard protocol without intravenous contrast. Multiplanar CT image reconstructions of the cervical spine and maxillofacial structures were also generated. COMPARISON:  None. FINDINGS: CT HEAD FINDINGS Brain: No evidence of acute infarction, hemorrhage, hydrocephalus, extra-axial collection or mass lesion/mass effect. There is mild periventricular white matter hypodensity, likely chronic small vessel ischemic change. Vascular: No hyperdense vessel or unexpected calcification. Skull: Normal. Negative for fracture or  focal lesion. Other: None. CT MAXILLOFACIAL FINDINGS Osseous: No fracture or mandibular dislocation. No destructive process. Orbits: There is preseptal right orbital soft tissue swelling and hematoma. Postseptal orbital soft tissues are within normal limits. Globes are intact bilaterally. There is no foreign body. Sinuses: Clear. Soft tissues: Negative. CT CERVICAL SPINE FINDINGS Alignment: There is trace anterolisthesis at C4-C5 which is likely degenerative. Alignment is otherwise anatomic. Skull base and vertebrae: There is anterior fusion plate at C5, C6 and G8-1 with incomplete incorporated disc space material. No hardware loosening. No acute fracture. No primary bone lesion. Soft tissues and spinal canal: No prevertebral fluid or swelling. No visible canal hematoma. Disc levels: There are degenerative changes of facet joints at C3-C4 and C4-C5. There is severe bilateral neural foraminal stenosis at C3-C4. No significant central canal stenosis at any level. Upper chest: There is scarring in the lung apices. Other: None. IMPRESSION: 1. No acute intracranial process. 2. No acute fracture or traumatic subluxation of the cervical spine. 3. No acute facial fracture. 4. Right periorbital swelling and hematoma. 5. C5-C7 anterior fusion. Electronically Signed   By: Ronney Asters M.D.   On: 06/20/2021 22:29   US Abdomen Limited RUQ (LIVER/GB)  Result Date: 06/02/2021 CLINICAL DATA:  Cirrhosis, cholecystectomy. EXAM: ULTRASOUND ABDOMEN LIMITED RIGHT UPPER QUADRANT COMPARISON:  CT abdomen and pelvis 10/22/2019. FINDINGS: Gallbladder: Surgically absent. Common bile duct: Diameter: 0.7 mm. Liver: Liver echotexture is diffusely heterogeneous and coarse. There is nodular liver contour as seen on prior CT. No focal lesions are seen. Portal vein is patent on color Doppler imaging with normal direction of blood flow towards the liver. Other: None. IMPRESSION: 1. Nodular liver contour with coarse heterogeneous echotexture  likely related to hepatic cirrhosis. 2. Cholecystectomy. Electronically Signed   By: Ronney Asters M.D.   On: 06/02/2021 20:57    Assessment/Plan 1. Acute pain of left knee Post fall 06/20/2021 had to crawl on her knees to get to the phone was evaluated in ED but did not have pain on left knee.will obtain imaging.OTC tylenol as below for pain. - DG Knee Complete 4 Views Left; Future - acetaminophen (TYLENOL) 500 MG tablet; Take 1 tablet (500 mg total) by mouth every 6 (six) hours as needed.  Dispense: 30 tablet; Refill: 0 - Advised to get right hand and left knee X-ray at [place] [facility] then will call you with results.  2. Finger pain, right Possible injured during the fall.ring and 5 th finger swollen and tender unable to bend fingers on touch thumb with finger.will obtain imaging to rule out fracture.  - DG Hand Complete Right; Future - acetaminophen (TYLENOL) 500 MG tablet; Take 1 tablet (500 mg total) by mouth every 6 (six) hours as needed.  Dispense: 30  tablet; Refill: 0  3. Blurry vision, right eye Worsen after the fall.Has right eyebrow/face bruise from the fall.recommend urgent Ophthalmology evaluation  - Ambulatory referral to Ophthalmology  4. Fall, subsequent encounter Fall and safety precautions discussed.No labs from ED for evaluation.will obtain CBC/diff to rule out anemia or other acute etiologies.  - CBC with Differential/Platelet  Family/ staff Communication: Reviewed plan of care with patient verbalized understanding.  Labs/tests ordered:  - CBC with Differential/Platelet - DG Knee Complete 4 Views Left; Future  Next Appointment: As needed if symptoms worsen or fail to improve    Sandrea Hughs, NP

## 2021-06-27 DIAGNOSIS — Z872 Personal history of diseases of the skin and subcutaneous tissue: Secondary | ICD-10-CM | POA: Diagnosis not present

## 2021-06-27 DIAGNOSIS — H1131 Conjunctival hemorrhage, right eye: Secondary | ICD-10-CM | POA: Diagnosis not present

## 2021-06-27 DIAGNOSIS — S0011XA Contusion of right eyelid and periocular area, initial encounter: Secondary | ICD-10-CM | POA: Diagnosis not present

## 2021-06-27 DIAGNOSIS — H0012 Chalazion right lower eyelid: Secondary | ICD-10-CM | POA: Diagnosis not present

## 2021-06-27 DIAGNOSIS — H25813 Combined forms of age-related cataract, bilateral: Secondary | ICD-10-CM | POA: Diagnosis not present

## 2021-06-30 ENCOUNTER — Telehealth: Payer: Self-pay

## 2021-06-30 NOTE — Telephone Encounter (Signed)
Nou, RN w/ [employer] called to speak with Dinah [name],NP about concerns for the patient. Nou states that she is concerned about the patient falling and hurting herself since she fractured her left ankle and the orthopedics states they will not do surgery and have the patient using a walker. 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 Nou states that [facility] which is something like home health suppose to go out to patient's house on 07/05/2021 to access the patient. Nou states she can be reached at [phone].

## 2021-06-30 NOTE — Telephone Encounter (Signed)
Contact POA for assistance and fall precautions.

## 2021-07-03 ENCOUNTER — Telehealth: Payer: Self-pay | Admitting: Family

## 2021-07-03 DIAGNOSIS — M25562 Pain in left knee: Secondary | ICD-10-CM

## 2021-07-03 NOTE — Addendum Note (Signed)
Addended by: Rafael Bihari A on: 07/03/2021 02:37 PM   Modules accepted: Orders

## 2021-07-03 NOTE — Telephone Encounter (Signed)
Good Morning, Jill Preston called this morning complaining of knee pain & saying its keeping her up at night. She said the x-ray she had did not show anything. She wants to know if she did come in if we would be able to do anything to help with it or not. She doesn't want to have to come into the office if its not going to do anything to help with her knee, she's been in pain for about two weeks now and the tylenol she said she doesn't want to keep having to take that to where it could affect her liver.  We can call the patient back and let her know if we should make an appt or just advise her on what to do.  Thank you!

## 2021-07-03 NOTE — Telephone Encounter (Addendum)
Patient notified and agreed. Wants Orthopaedic Referral to evaluate Left Knee.  Pended Referral and sent to [facility] for approval.

## 2021-07-03 NOTE — Telephone Encounter (Signed)
I recommend a referral for Orthopedic to evaluate knee pain

## 2021-07-05 ENCOUNTER — Telehealth: Payer: Self-pay

## 2021-07-05 NOTE — Telephone Encounter (Signed)
Lisa with [facility] called and states pt called her voicing concern for continued decrease in platelet count. Pt states she called scheduling and has not heard back. Pt would like to know if she needs f/u appt per 06/26/21 labs. Will defer this question to Dede Query, [place]. Lattie Haw states she will call pt to let her know the [facility] will call if she needs an appt

## 2021-07-11 ENCOUNTER — Telehealth: Payer: Self-pay | Admitting: *Deleted

## 2021-07-11 NOTE — Telephone Encounter (Signed)
error 

## 2021-07-12 DIAGNOSIS — M1712 Unilateral primary osteoarthritis, left knee: Secondary | ICD-10-CM | POA: Diagnosis not present

## 2021-07-17 DIAGNOSIS — I130 Hypertensive heart and chronic kidney disease with heart failure and stage 1 through stage 4 chronic kidney disease, or unspecified chronic kidney disease: Secondary | ICD-10-CM | POA: Diagnosis not present

## 2021-07-17 DIAGNOSIS — E782 Mixed hyperlipidemia: Secondary | ICD-10-CM | POA: Diagnosis not present

## 2021-07-17 DIAGNOSIS — I5032 Chronic diastolic (congestive) heart failure: Secondary | ICD-10-CM | POA: Diagnosis not present

## 2021-07-17 DIAGNOSIS — N183 Chronic kidney disease, stage 3 unspecified: Secondary | ICD-10-CM | POA: Diagnosis not present

## 2021-07-19 DIAGNOSIS — M25662 Stiffness of left knee, not elsewhere classified: Secondary | ICD-10-CM | POA: Diagnosis not present

## 2021-07-19 DIAGNOSIS — R262 Difficulty in walking, not elsewhere classified: Secondary | ICD-10-CM | POA: Diagnosis not present

## 2021-07-19 DIAGNOSIS — M6281 Muscle weakness (generalized): Secondary | ICD-10-CM | POA: Diagnosis not present

## 2021-08-15 ENCOUNTER — Telehealth: Payer: Self-pay | Admitting: Cardiovascular Disease

## 2021-08-15 NOTE — Telephone Encounter (Signed)
Patient has been made aware and will call back if the weight does not go down.  Croitoru, Mihai, MD  You; Pinnix, Marikay Alar, LPN 2 hours ago (8:06 PM)   Please continue to take  - furosemide 80 mg AM + 40 mg PM  - KCl 20 mEq AM + 20 mEq PM  EVERY DAY UNTIL weight is 185 lb, then go back to the baseline regimen

## 2021-08-15 NOTE — Telephone Encounter (Signed)
  Pt c/o swelling: STAT is pt has developed SOB within 24 hours  If swelling, where is the swelling located? Hands and legs  How much weight have you gained and in what time span?   Have you gained 3 pounds in a day or 5 pounds in a week? 5 lbs in a week  Do you have a log of your daily weights (if so, list)?   Are you currently taking a fluid pill? Yes   Are you currently SOB? Yes   Have you traveled recently? No   Pt said, she's been coughing all the time, when she eats, drink or even just laying down. She feels she gained fluid in her lungs and wanted to know if this is a sign of her CHF

## 2021-08-15 NOTE — Telephone Encounter (Signed)
Spoke with pt who c/o weight gain since Thanksgiving. Pt reports previous weight was 189 lbs and today's weight is 195 lbs. Pt does c/o cough since Oct. And SOB on and off. She states that SOB is worse with talking, eating of laying down. Pt did take an extra 1/2 tablet of Lasix on thanksgiving night. Pt states that she did not do the cooking on thanksgiving her children did and they use salt. She has not taken another since. Current BP is 121/71 HR 70. Please advise.

## 2021-08-20 DIAGNOSIS — R600 Localized edema: Secondary | ICD-10-CM | POA: Diagnosis not present

## 2021-08-21 ENCOUNTER — Telehealth: Payer: Self-pay | Admitting: Cardiovascular Disease

## 2021-08-21 MED ORDER — FUROSEMIDE 80 MG PO TABS: 80.0000 mg | ORAL_TABLET | Freq: Every day | ORAL | 3 refills | Status: DC

## 2021-08-21 NOTE — Telephone Encounter (Signed)
Patient said she has had fluid leaking out of her left leg and foot. It made her sock and shoe get saturated. She called her Nurse through [facility] and they advised her to call Dr. Loletha Grayer to see what she needs to do

## 2021-08-21 NOTE — Telephone Encounter (Signed)
Croitoru, Mihai, MD  You 28 minutes ago (9:50 AM)   Please take furosemide 80 mg twice daily on days when her weight is 190 lb or higher, 80 mg once daily otherwise.  Last time I saw her in September she was 186 lb    Spoke with patient and relayed plan from MD She verbalized understanding Updated Rx sent to [facility]  Scheduled for f/u on 12/16 with Dr. Loletha Grayer (DOD) and scheduled 6 month visit in April 2023 Advised she can call to cancel 12/16 visit with swelling/weeping improves and weights stablize

## 2021-08-21 NOTE — Telephone Encounter (Signed)
Returned call to patient of Dr. Loletha Grayer   She reports weeping from left leg. She noticed on Saturday night, her socks and shoe were saturated. This occurred on Sunday AM also - bed was saturated with fluid. A nurse with insurance company assessed her yesterday afternoon and advised she call our office. She reports bilateral edema, R>L but reports she has a torn tendon in this leg from a fall.  Patient is taking lasix 80mg  daily. She has had to use PRN 40mg  d/t weight, several times recently. Her weight was down to 190lbs last week, but she reports it is creeping up. She also reports a bad cough. She woke up last night at midnight, weighed herself (191lbs), and drank a cup of hot coffee which helped her cough. She weighed while on the phone (194lbs). She did not take her BP this morning.   She does not cook with salt or add salt, but she consumed some over Thanksgiving, as her kids cook with this  Advised will send a message to Dr. Loletha Grayer to review

## 2021-08-23 DIAGNOSIS — M25572 Pain in left ankle and joints of left foot: Secondary | ICD-10-CM | POA: Diagnosis not present

## 2021-08-23 DIAGNOSIS — M1712 Unilateral primary osteoarthritis, left knee: Secondary | ICD-10-CM | POA: Diagnosis not present

## 2021-08-28 DIAGNOSIS — H25813 Combined forms of age-related cataract, bilateral: Secondary | ICD-10-CM | POA: Diagnosis not present

## 2021-08-30 DIAGNOSIS — M19072 Primary osteoarthritis, left ankle and foot: Secondary | ICD-10-CM | POA: Diagnosis not present

## 2021-08-30 DIAGNOSIS — M19071 Primary osteoarthritis, right ankle and foot: Secondary | ICD-10-CM | POA: Diagnosis not present

## 2021-09-01 ENCOUNTER — Encounter: Payer: Self-pay | Admitting: Cardiovascular Disease

## 2021-09-01 ENCOUNTER — Other Ambulatory Visit: Payer: Self-pay

## 2021-09-01 ENCOUNTER — Ambulatory Visit: Payer: HMO | Admitting: Cardiovascular Disease

## 2021-09-01 VITALS — BP 122/58 | HR 73 | Ht 65.0 in | Wt 193.4 lb

## 2021-09-01 DIAGNOSIS — I10 Essential (primary) hypertension: Secondary | ICD-10-CM | POA: Diagnosis not present

## 2021-09-01 DIAGNOSIS — I25118 Atherosclerotic heart disease of native coronary artery with other forms of angina pectoris: Secondary | ICD-10-CM | POA: Diagnosis not present

## 2021-09-01 DIAGNOSIS — I5032 Chronic diastolic (congestive) heart failure: Secondary | ICD-10-CM

## 2021-09-01 NOTE — Patient Instructions (Signed)
Medication Instructions:  No changes *If you need a refill on your cardiac medications before your next appointment, please call your pharmacy*   Lab Work: Your provider would like for you to have the following labs: BMET at your PCP   If you have labs (blood work) drawn today and your tests are completely normal, you will receive your results only by: [facility] [place] (if you have MyChart) OR A paper copy in the mail If you have any lab test that is abnormal or we need to change your treatment, we will call you to review the results.   Testing/Procedures: None ordered   Follow-Up: At [facility], you and your health needs are our priority.  As part of our continuing mission to provide you with exceptional heart care, we have created designated Provider Care Teams.  These Care Teams include your primary Cardiologist (physician) and Advanced Practice Providers (APPs -  Physician Assistants and Nurse Practitioners) who all work together to provide you with the care you need, when you need it.  We recommend signing up for the patient portal called "MyChart".  Sign up information is provided on this After Visit Summary.  MyChart is used to connect with patients for Virtual Visits (Telemedicine).  Patients are able to view lab/test results, encounter notes, upcoming appointments, etc.  Non-urgent messages can be sent to your provider as well.   To learn more about what you can do with MyChart, go to [email or web address].    Your next appointment:   6 month(s)  The format for your next appointment:   In Person  Provider:   Sanda Klein, MD

## 2021-09-04 NOTE — Progress Notes (Signed)
Patient ID: MEGHAM DWYER, female   DOB: [birth year], 87 y.o.   MRN: [id number] Patient ID: MEREDITH KILBRIDE, female   DOB: [birth year], 87 y.o.   MRN: [id number]    Cardiology Office Note    Date:  09/04/2021   ID:  BAYLEA MILBURN, DOB 1932-09-25, MRN [id number]  PCP:  Sandrea Hughs, NP  Cardiologist:   Sanda Klein, MD   chief complaint: CHF  History of Present Illness:  Jill Preston is a 87 y.o. female returning in follow-up for CAD and CHF.  Known moderate stenosis of the left main coronary artery, and found to not be hemodynamically significant by FFR analysis in 2016 and again in 2018.  She has chronic diastolic heart failure that responds well to diuretics, but excessive diuresis has occasionally led to acute kidney injury.  She currently feels that she is doing better since her last appointment.  She has not had any problems with chest pain.  She continues to have lower extremity edema and shortness of breath when walking around the house, NYHA functional class II-3.  On her home scale she weighs 189.5 pounds (roughly 4 pounds less than our office scale shows an just within our previous assessment of "dry weight" of 185-190 pounds.  She had had some weeping from a old wound/scar on her left anterior shin, this has stopped.  She does not have orthopnea or PND. She chronically sleeps with the head of the bed elevated about 30 degrees.  Her prescription calls for her to take an extra furosemide tablet if she has a weight over 190 pounds.  She does this almost every day, for a usual prescription of 80 mg twice daily.  In October she had a fall.  She made a sudden change of the head and developed severe vertigo.  She did not have syncope.  She did hit her head and have a right periorbital hematoma.  Imaging did not show any evidence of intracranial bleeding.  She has been wearing some ankle braces and these help with her stability when walking.  She remains quite tired.  Has  a hard time sleeping since her husband is up and down all night long due to dementia.    She has a long history of coronary disease. She underwent proximal LAD bare-metal stenting and balloon angioplasty of the mid LAD in 2013. She has had persistent ischemia in the territory of the diagonal artery on nuclear stress test performed over the last few years. Coronary angiography performed May 2016 showed a widely patent LAD stent, 60% ostial circumflex stenosis, 20-30% right coronary artery stenosis. There was concern about possible left coronary artery stenosis but fractional flow reserve was normal (0.96 at baseline, 0.91 during intravenous adenosine infusion). She had good anginal response to Ranexa but developed severe constipation and could not tolerate the medication. She has been intolerant to beta blockers due to bradycardia. She has preserved left ventricular systolic function but has had episodes of acute exacerbation of diastolic heart failure attributed to hypertensive heart disease. December 2015, she was critically ill with an acute left iliofemoral DVT with anticoagulation complicated by retroperitoneal hematoma and hemorrhagic shock, requiring placement of an inferior vena cava filter. The filter was removed in June 2016. Coronary angiography in July 2018 showed unchanged anatomy of the eccentric stenosis in the left main coronary artery with noncritical FFR (0.89 in the LAD, 0.86 in the LCx).  Intravascular Pressure Wire/FFR Study  Left Heart Cath and Coronary Angiography  Conclusion     LM lesion, 70 %stenosed. Mid LAD lesion, 0 %stenosed at site of prior stent. Ost Cx lesion, 60 %stenosed. Prox RCA-1 lesion, 20 %stenosed. Prox RCA-2 lesion, 30 %stenosed. The left ventricular systolic function is normal. LV end diastolic pressure is normal. The left ventricular ejection fraction is 55-65% by visual estimate.   1. 70% eccentric distal left main stenosis. Angiographically similar to  2016. FFR 0.89 into the LAD and 0.86 into the LCx suggesting that this lesion is not flow limiting.  2. Otherwise nonobstructive CAD. Patent stent in LAD. 3. Normal LV function 4. Normal LVEDP   Plan: recommend continued medical therapy.     Past Medical History:  Diagnosis Date   Arthritis    right knee   Cancer ([place]) yrs ago   skin cancer removed from face   Chronic diastolic CHF (congestive heart failure) (HCC)    Chronic venous insufficiency    LEA VENOUS, 10/17/2011 - mild reflux in bilateral common femoral veins   CKD (chronic kidney disease), stage III ([place])    Coronary artery disease    a. s/p PCI/BMS to prox LAD and balloon angioplasty to mLAD with suboptimal result in 2000. b. Abnl nuc 2012, cath 09/2011 - showed that the overall territory of potential ischemia was small and attributable to a moderately diseased small second diagonal artery. Med rx.   Dyspnea    with activity   Headache    History of blood transfusion 2017   Hyperlipidemia    Hypertension    Hypertensive heart disease    Hypothyroidism    Obesity    Pneumonia    several times   Stroke [facility])    pt. states she had "light stroke" in sept. 1980   Urinary incontinence     Past Surgical History:  Procedure Laterality Date   ABDOMINAL HYSTERECTOMY  1970's   complete   ANKLE ARTHROSCOPY Right 07/10/2017   Procedure: ANKLE ARTHROSCOPY;  Surgeon: [name], DPM;  Location: [place];  Service: Podiatry;  Laterality: Right;   BACK SURGERY  07/26/2016   cervical neck surgery, [place] [facility]   BLADDER SURGERY  2010   WITH MESH  bladder tach   bunion removal surgery Bilateral 15 yrs ago   CARDIAC CATHETERIZATION Left 09/25/2011   Medical management   CARDIAC CATHETERIZATION Left 03/25/2001   Normal LV function, LAD residual narrowing of less than 10%, normal ramus intermediate, circumflex, and RCA,    CARDIAC CATHETERIZATION  09/04/1999   LAD, 3x57mm Tetra stent resulting in a reduction of the  80% stenosis to 0% residual   CARDIAC CATHETERIZATION N/A 01/26/2015   Procedure: Right/Left Heart Cath and Coronary Angiography;  Surgeon: Troy Sine, MD;  Location: [place] CV [facility];  Service: Cardiovascular;  Laterality: N/A;   CARDIAC CATHETERIZATION N/A 01/27/2015   Procedure: Intravascular Pressure Wire/FFR Study;  Surgeon: Burnell Blanks, MD;  Location: [place] [facility];  Service: Cardiovascular;  Laterality: N/A;   CARDIAC CATHETERIZATION N/A 01/27/2015   Procedure: Right Heart Cath;  Surgeon: Burnell Blanks, MD;  Location: [place] [facility];  Service: Cardiovascular;  Laterality: N/A;   CHOLECYSTECTOMY     COLONOSCOPY WITH PROPOFOL N/A 03/07/2017   Procedure: COLONOSCOPY WITH PROPOFOL;  Surgeon: Juanita Craver, MD;  Location: [facility];  Service: Endoscopy;  Laterality: N/A;   CORONARY STENT PLACEMENT     LAD x 1   ganglion cyst removal  yrs ago   x 2   ILIAC  VEIN ANGIOPLASTY / STENTING  02/15/2015   INTRAVASCULAR PRESSURE WIRE/FFR STUDY N/A 04/05/2017   Procedure: Intravascular Pressure Wire/FFR Study;  Surgeon: Martinique, Peter M, MD;  Location: [place] [facility];  Service: Cardiovascular;  Laterality: N/A;   IVC FILTER INSERTION  2016   IVC FILTER REMOVAL  02/15/2015   at [facility] CATH AND CORONARY ANGIOGRAPHY N/A 04/05/2017   Procedure: Left Heart Cath and Coronary Angiography;  Surgeon: Martinique, Peter M, MD;  Location: [place] [facility];  Service: Cardiovascular;  Laterality: N/A;   LEFT HEART CATHETERIZATION WITH CORONARY ANGIOGRAM N/A 09/25/2011   Procedure: LEFT HEART CATHETERIZATION WITH CORONARY ANGIOGRAM;  Surgeon: Sanda Klein, MD;  Location: [place] CATH LAB;  Service: Cardiovascular;  Laterality: N/A;   multiple bladder surgeries to remove mesh     ROTATOR CUFF REPAIR Right    stent to groin Left 08/2014   left leg   TENDON REPAIR Right 07/10/2017   Procedure: RIGHT PERONEAL TENDON REPAIR;  Surgeon: [name], DPM;  Location: [place];   Service: Podiatry;  Laterality: Right;   TENDON REPAIR Right 05/27/2019   Procedure: PERONEAL TENDON REPAIR x2;  Surgeon: [name], DPM;  Location: [facility];  Service: Podiatry;  Laterality: Right;    Outpatient Medications Prior to Visit  Medication Sig Dispense Refill   amLODipine (NORVASC) 2.5 MG tablet Take 1 tablet (2.5 mg total) by mouth daily. 90 tablet 3   BOUDREAUXS BUTT PASTE 40 % ointment Apply topically 2 (two) times daily.     cholecalciferol (VITAMIN D3) 25 MCG (1000 UNIT) tablet Take 1,000 Units by mouth daily.      Docusate Sodium 100 MG capsule Take 200 mg by mouth daily at 8 pm. If no BM patient will also take 2 in the pm     ezetimibe (ZETIA) 10 MG tablet TAKE 1 TABLET(10 MG) BY MOUTH DAILY 90 tablet 3   fluticasone (FLONASE) 50 MCG/ACT nasal spray Place 2 sprays into both nostrils daily. 16 g 6   furosemide (LASIX) 80 MG tablet Take 1 tablet (80 mg total) by mouth daily. TAKE AN EXTRA 80mg  in the afternoon if weight is over 190 135 tablet 3   isosorbide dinitrate (ISORDIL) 30 MG tablet Take 60 mg by mouth daily at 12 noon. In the morning     ketoconazole (NIZORAL) 2 % shampoo as needed.     KLOR-CON M20 20 MEQ tablet Take 1 tablet (20 mEq total) by mouth See admin instructions. Take one tablet (20 meq) by mouth every morning and with each afternoon dose of lasix 180 tablet 3   levothyroxine (SYNTHROID, LEVOTHROID) 112 MCG tablet Take 112 mcg by mouth daily before breakfast.   4   OVER THE COUNTER MEDICATION Take 2 tablets by mouth daily. "Focus factor"     pravastatin (PRAVACHOL) 80 MG tablet Take 80 mg by mouth at bedtime.     acetaminophen (TYLENOL) 500 MG tablet Take 1 tablet (500 mg total) by mouth every 6 (six) hours as needed. (Patient not taking: Reported on 09/01/2021) 30 tablet 0   aspirin EC 81 MG tablet Take 1 tablet (81 mg total) by mouth every other day. (Patient not taking: Reported on 09/01/2021) 90 tablet 3   bacitracin-polymyxin b (POLYSPORIN) ophthalmic  ointment Place 1 application into the right eye every 12 (twelve) hours. (Patient not taking: Reported on 09/01/2021) 3.5 g 0   Facility-Administered Medications Prior to Visit  Medication Dose Route Frequency Provider Last Rate Last  Admin   betamethasone acetate-betamethasone sodium phosphate (CELESTONE) injection 6 mg  6 mg Other Once [name], DPM         Allergies:   Atorvastatin, Penicillins, Shellfish allergy, Aminophylline, Iodinated diagnostic agents, Latex, Oxybutynin chloride, Vancomycin, Adhesive [tape], Betadine [povidone iodine], Ciprofloxacin, Codeine, and Ranolazine   Social History   Socioeconomic History   Marital status: Married    Spouse name: Not on file   Number of children: Not on file   Years of education: Not on file   Highest education level: Not on file  Occupational History   Not on file  Tobacco Use   Smoking status: Never   Smokeless tobacco: Never  Vaping Use   Vaping Use: Never used  Substance and Sexual Activity   Alcohol use: No   Drug use: No   Sexual activity: Not on file  Other Topics Concern   Not on file  Social History Narrative   Per [facility] New Patient Packet Abstracted on 03/06/2021      Diet: Left blank       Caffeine: No      Married, if yes what year: Yes, 1955      Do you live in a house, apartment, assisted living, condo, trailer, ect: House      Is it one or more stories: 1 story      How many persons live in your home? 2      Pets: No      Highest level or education completed: 12 th grade      Current/Past profession: Futures trader       Exercise:          Yes       Type and how often: Walking          Living Will: Yes   DNR: No   POA/HPOA: Yes      Functional Status:   Do you have difficulty bathing or dressing yourself? Left Blank    Do you have difficulty preparing food or eating?Left Blank   Do you have difficulty managing your medications?Left Blank   Do you have difficulty managing your  finances?Left Blank   Do you have difficulty affording your medications?Left Blank   Social Determinants of Health   Financial Resource Strain: Not on file  Food Insecurity: Not on file  Transportation Needs: Not on file  Physical Activity: Not on file  Stress: Not on file  Social Connections: Not on file     Family History:  The patient's family history includes Brain cancer in her brother; Cancer in her mother and sister; Diabetes in her brother; Heart attack in her brother; Heart disease in her brother, brother, brother, father, sister, and sister; High blood pressure in her brother, brother, and sister.   ROS:   Please see the history of present illness.    ROS All other systems are reviewed and are negative.   PHYSICAL EXAM:   VS:  BP (!) 122/58 (BP Location: Left Arm, Patient Position: Sitting, Cuff Size: Large)    Pulse 73    Ht 5\' 5"  (1.651 m)    Wt 193 lb 6.4 oz (87.7 kg)    SpO2 95%    BMI 32.18 kg/m      General: Alert, oriented x3, no distress, obese Head: no evidence of trauma, PERRL, EOMI, no exophtalmos or lid lag, no myxedema, no xanthelasma; normal ears, nose and oropharynx Neck: normal jugular venous pulsations and no hepatojugular reflux;  brisk carotid pulses without delay and no carotid bruits Chest: clear to auscultation, no signs of consolidation by percussion or palpation, normal fremitus, symmetrical and full respiratory excursions Cardiovascular: normal position and quality of the apical impulse, regular rhythm, normal first and second heart sounds, no murmurs, rubs or gallops Abdomen: no tenderness or distention, no masses by palpation, no abnormal pulsatility or arterial bruits, normal bowel sounds, no hepatosplenomegaly Extremities: no clubbing, cyanosis ; symmetrical 2+ edema; 2+ radial, ulnar and brachial pulses bilaterally; 2+ right femoral, posterior tibial and dorsalis pedis pulses; 2+ left femoral, posterior tibial and dorsalis pedis pulses; no  subclavian or femoral bruits Neurological: grossly nonfocal Psych: Normal mood and affect  Wt Readings from Last 3 Encounters:  09/01/21 193 lb 6.4 oz (87.7 kg)  06/26/21 189 lb 6.4 oz (85.9 kg)  06/20/21 192 lb (87.1 kg)      Studies/Labs Reviewed:   EKG:  EKG is not ordered today, last tracing was personally reviewed, shows normal sinus rhythm with minor nonspecific T wave changes, actually improved from previous tracings.  QTc 438 ms Recent Labs: 02/27/2021: TSH 1.92 04/14/2021: ALT 24; BUN 17; Creatinine 0.83; Potassium 4.6; Sodium 143 06/26/2021: Hemoglobin 14.8; Platelets 115   Lipid Panel    Component Value Date/Time   CHOL 155 02/27/2021 0000   CHOL 193 11/18/2017 0857   TRIG 57 02/27/2021 0000   HDL 85 (A) 02/27/2021 0000   HDL 47 11/18/2017 0857   CHOLHDL 4.1 11/18/2017 0857   CHOLHDL 4.2 06/27/2015 1336   VLDL 48 (H) 06/27/2015 1336   LDLCALC 59 02/27/2021 0000   LDLCALC 104 (H) 11/18/2017 0857      Labs 03/23/2020 Total cholesterol 145, HDL 55, LDL 69, triglycerides 118 TSH 0.886  09/21/2020 Total cholesterol 122, HDL 56, LDL 47, triglycerides 101   CATH 04/05/2017  LM lesion, 70 %stenosed. Mid LAD lesion, 0 %stenosed at site of prior stent. Ost Cx lesion, 60 %stenosed. Prox RCA-1 lesion, 20 %stenosed. Prox RCA-2 lesion, 30 %stenosed. The left ventricular systolic function is normal. LV end diastolic pressure is normal. The left ventricular ejection fraction is 55-65% by visual estimate.   1. 70% eccentric distal left main stenosis. Angiographically similar to 2016. FFR 0.89 into the LAD and 0.86 into the LCx suggesting that this lesion is not flow limiting.  2. Otherwise nonobstructive CAD. Patent stent in LAD. 3. Normal LV function 4. Normal LVEDP   Plan: recommend continued medical therapy.    ASSESSMENT:    1. Chronic diastolic heart failure ([place])   2. Coronary artery disease of native artery of native heart with stable angina pectoris  ([place])   3. Essential hypertension      PLAN:  In order of problems listed above:  CHF: Appears to be at the upper end of desirable weight range (185-190 pounds).  Continues to have NYHA functional class 2-3 exertional dyspnea and some ankle edema.  Previous attempts at aggressive diuresis using metolazone has led to acute kidney injury on a couple of occasions.  Continue dietary sodium restriction, daily weights.  Call if her weight fails to decrease below 190 pounds with increased diuretic dose. CAD: Currently without complaints of angina.  She has a known moderate stenosis in the left coronary artery that was not significant by FFR performed as recently as 2018.  Nuclear stress testing would not be helpful to reassess this.  Consider CT angiography to evaluate in the future, if symptoms recur..  She does not tolerate beta-blockers due to bradycardia  and ranolazine caused severe constipation. HTN: Well-controlled.  Always check blood pressure in the right arm. PAD: Known left subclavian stenosis, without subclavian steal syndrome or claudication. Hyperlipidemia: Excellent lipid profile on the current statin and ezetimibe combination. Obesity: Only borderline obese.  We have recalibrated her "dry weight" to 185 pounds as she has lost real weight.   Medication Adjustments/Labs and Tests Ordered: Current medicines are reviewed at length with the patient today.  Concerns regarding medicines are outlined above.  Medication changes, Labs and Tests ordered today are listed in the Patient Instructions below. Patient Instructions  Medication Instructions:  No changes *If you need a refill on your cardiac medications before your next appointment, please call your pharmacy*   Lab Work: Your provider would like for you to have the following labs: BMET at your PCP   If you have labs (blood work) drawn today and your tests are completely normal, you will receive your results only by: [place]  (if you have MyChart) OR A paper copy in the mail If you have any lab test that is abnormal or we need to change your treatment, we will call you to review the results.   Testing/Procedures: None ordered   Follow-Up: At [facility], you and your health needs are our priority.  As part of our continuing mission to provide you with exceptional heart care, we have created designated Provider Care Teams.  These Care Teams include your primary Cardiologist (physician) and Advanced Practice Providers (APPs -  Physician Assistants and Nurse Practitioners) who all work together to provide you with the care you need, when you need it.  We recommend signing up for the patient portal called "MyChart".  Sign up information is provided on this After Visit Summary.  MyChart is used to connect with patients for Virtual Visits (Telemedicine).  Patients are able to view lab/test results, encounter notes, upcoming appointments, etc.  Non-urgent messages can be sent to your provider as well.   To learn more about what you can do with MyChart, go to [email or web address].    Your next appointment:   6 month(s)  The format for your next appointment:   In Person  Provider:   Sanda Klein, MD         Signed, Sanda Klein, MD  09/04/2021 9:42 AM    [address], [place], [place]  [address] Phone: [phone]; Fax: [phone]

## 2021-09-05 ENCOUNTER — Other Ambulatory Visit: Payer: Self-pay

## 2021-09-05 ENCOUNTER — Encounter: Payer: Self-pay | Admitting: Family

## 2021-09-05 ENCOUNTER — Ambulatory Visit (INDEPENDENT_AMBULATORY_CARE_PROVIDER_SITE_OTHER): Payer: HMO | Admitting: Family

## 2021-09-05 VITALS — BP 140/70 | HR 61 | Temp 97.6°F | Resp 16 | Ht 65.0 in | Wt 195.2 lb

## 2021-09-05 DIAGNOSIS — E039 Hypothyroidism, unspecified: Secondary | ICD-10-CM

## 2021-09-05 DIAGNOSIS — I5032 Chronic diastolic (congestive) heart failure: Secondary | ICD-10-CM | POA: Diagnosis not present

## 2021-09-05 DIAGNOSIS — M79604 Pain in right leg: Secondary | ICD-10-CM | POA: Diagnosis not present

## 2021-09-05 DIAGNOSIS — I739 Peripheral vascular disease, unspecified: Secondary | ICD-10-CM

## 2021-09-05 DIAGNOSIS — I10 Essential (primary) hypertension: Secondary | ICD-10-CM | POA: Diagnosis not present

## 2021-09-05 DIAGNOSIS — M79672 Pain in left foot: Secondary | ICD-10-CM

## 2021-09-05 DIAGNOSIS — E785 Hyperlipidemia, unspecified: Secondary | ICD-10-CM

## 2021-09-05 NOTE — Progress Notes (Signed)
Provider: Marlowe Sax FNP-C   Maico Mulvehill, [name], NP  Patient Care Team: Denee Boeder, [name], NP as PCP - General (Family Medicine) Croitoru, Dani Gobble, MD as PCP - Cardiology (Cardiology) Sanda Klein, MD as Consulting Physician (Cardiology) Druscilla Brownie, MD as Referring Physician (Dermatology) [name], DPM as Consulting Physician (Podiatry) [name], [name], Anna [place] (Optometry) [name], MD as Referring Physician (Gynecology)  Extended Emergency Contact Information Primary Emergency Contact: Yau,Lewis "Meribeth Mattes [place] Phone: [phone] Mobile Phone: [phone] Relation: Son Secondary Emergency Contact: [facility] Phone: [phone] Relation: Friend  Code Status:  Full Code  Goals of care: Advanced Directive information Advanced Directives 09/05/2021  Does Patient Have a Medical Advance Directive? Yes  Type of Advance Directive Out of facility DNR (pink MOST or yellow form)  Does patient want to make changes to medical advance directive? No - Patient declined  Copy of [place] in Chart? -  Would patient like information on creating a medical advance directive? No - Patient declined     Chief Complaint  Patient presents with   Medical Management of Chronic Issues    6 month follow up.    Immunizations    Discuss the need for 2nd Covid Booster and Influenza vaccine.     HPI:  Pt is a 87 y.o. female seen today for 6 months for medical management of chronic diseases. She denies any new acute issues today. Appetite has been good.   Wears right leg brace takes them off at night.right foot wakes her up but usually walks around and it improves.  Had small fractures on left foot and twisted the knee with last fall.follows up with Orthopedic.Takes Tylenol as needed.tries to not take it.  Due for 2nd COVID-19 and Influenza vaccine    Past Medical History:  Diagnosis Date    Arthritis    right knee   Cancer ([place]) yrs ago   skin cancer removed from face   Chronic diastolic CHF (congestive heart failure) (HCC)    Chronic venous insufficiency    LEA VENOUS, 10/17/2011 - mild reflux in bilateral common femoral veins   CKD (chronic kidney disease), stage III ([place])    Coronary artery disease    a. s/p PCI/BMS to prox LAD and balloon angioplasty to mLAD with suboptimal result in 2000. b. Abnl nuc 2012, cath 09/2011 - showed that the overall territory of potential ischemia was small and attributable to a moderately diseased small second diagonal artery. Med rx.   Dyspnea    with activity   Headache    History of blood transfusion 2017   Hyperlipidemia    Hypertension    Hypertensive heart disease    Hypothyroidism    Obesity    Pneumonia    several times   Stroke [facility])    pt. states she had "light stroke" in sept. 1980   Urinary incontinence    Past Surgical History:  Procedure Laterality Date   ABDOMINAL HYSTERECTOMY  1970's   complete   ANKLE ARTHROSCOPY Right 07/10/2017   Procedure: ANKLE ARTHROSCOPY;  Surgeon: [name], DPM;  Location: [place];  Service: Podiatry;  Laterality: Right;   BACK SURGERY  07/26/2016   cervical neck surgery,  surgical center   BLADDER SURGERY  2010   WITH MESH  bladder tach   bunion removal surgery Bilateral 15 yrs ago   CARDIAC CATHETERIZATION Left 09/25/2011   Medical  management   CARDIAC CATHETERIZATION Left 03/25/2001   Normal LV function, LAD residual narrowing of less than 10%, normal ramus intermediate, circumflex, and RCA,    CARDIAC CATHETERIZATION  09/04/1999   LAD, 3x43m Tetra stent resulting in a reduction of the 80% stenosis to 0% residual   CARDIAC CATHETERIZATION N/A 01/26/2015   Procedure: Right/Left Heart Cath and Coronary Angiography;  Surgeon: TTroy Sine MD;  [facility];  Service: Cardiovascular;  Laterality: N/A;   CARDIAC CATHETERIZATION N/A 01/27/2015   Procedure:  Intravascular Pressure Wire/FFR Study;  Surgeon: CBurnell Blanks MD;  [place] [facility] [place];  Service: Cardiovascular;  Laterality: N/A;   CARDIAC CATHETERIZATION N/A 01/27/2015   Procedure: Right Heart Cath;  Surgeon: CBurnell Blanks MD;  [facility];  Service: Cardiovascular;  Laterality: N/A;   CHOLECYSTECTOMY     COLONOSCOPY WITH PROPOFOL N/A 03/07/2017   Procedure: COLONOSCOPY WITH PROPOFOL;  Surgeon: MJuanita Craver MD;  [facility]: [facility];  Service: Endoscopy;  Laterality: N/A;   CORONARY STENT PLACEMENT     LAD x 1   ganglion cyst removal  yrs ago   x 2   ILIAC VEIN ANGIOPLASTY / STENTING  02/15/2015   INTRAVASCULAR PRESSURE WIRE/FFR STUDY N/A 04/05/2017   Procedure: Intravascular Pressure Wire/FFR Study;  Surgeon: JMartinique Peter M, MD;  [place];  Service: Cardiovascular;  Laterality: N/A;   IVC FILTER INSERTION  2016   [place]  [place]15/2015   [place] N/A 04/05/2017   Procedure: Left Heart Cath and Coronary Angiography;  Surgeon: JMartinique Peter M, MD;  [facility];  Service: Cardiovascular;  Laterality: N/A;   LEFT HEART CATHETERIZATION WITH CORONARY ANGIOGRAM N/A 09/25/2011   Procedure: LEFT HEART CATHETERIZATION WITH CORONARY ANGIOGRAM;  Surgeon: MSanda Klein MD;  [place] [facility];  Service: Cardiovascular;  Laterality: N/A;   multiple bladder surgeries to remove mesh     ROTATOR CUFF REPAIR Right    stent to groin Left 08/2014   left leg   TENDON REPAIR Right 07/10/2017   Procedure: RIGHT PERONEAL TENDON REPAIR;  Surgeon: PEvelina Bucy DPM;  [place]  Service: Podiatry;  Laterality: Right;   TENDON REPAIR Right 05/27/2019   Procedure: PERONEAL TENDON REPAIR x2;  Surgeon: PEvelina Bucy DPM;  Location[facility];  Service: Podiatry;  Laterality: Right;    Allergies  Allergen Reactions   Atorvastatin Anaphylaxis    Patient does not  remember; caused pneumonia- took her off of it per patient.    Penicillins Anaphylaxis, Hives and Swelling    Tongue swelling Did it involve swelling of the face/tongue/throat, SOB, or low BP? Yes Did it involve sudden or severe rash/hives, skin peeling, or any reaction on the inside of your mouth or nose? Yes Did you need to seek medical attention at a hospital or doctor's office? Yes When did it last happen?  [age]or 87year old    If all above answers are NO, may proceed with cephalosporin use.    Shellfish Allergy Anaphylaxis and Nausea And Vomiting    Severe nausea and vomiting   Aminophylline Itching, Swelling and Rash    Pt experienced burning urination, itching, redness/rash, and swelling of genitals after given this medication via IV push.   Iodinated Diagnostic Agents Swelling and Other (See Comments)    SWELLING REACTION UNSPECIFIED  fFLUSHING   Latex Other (See Comments)  Causes blisters   Oxybutynin Chloride Other (See Comments)    "blisters"   Propoxyphene    Vancomycin Hives    06/02/2018- Hives arm, back and chest   Adhesive [Tape] Rash    Paper tape is ok   Betadine [Povidone Iodine] Rash   Ciprofloxacin Hives   Codeine Nausea And Vomiting    .   Ranolazine Other (See Comments)    Constipation  .    Allergies as of 09/05/2021       Reactions   Atorvastatin Anaphylaxis   Patient does not remember; caused pneumonia- took her off of it per patient.    Penicillins Anaphylaxis, Hives, Swelling   Tongue swelling Did it involve swelling of the face/tongue/throat, SOB, or low BP? Yes Did it involve sudden or severe rash/hives, skin peeling, or any reaction on the inside of your mouth or nose? Yes Did you need to seek medical attention at a hospital or doctor's office? Yes When did it last happen?  44 or 87 year old    If all above answers are NO, may proceed with cephalosporin use.   Shellfish Allergy Anaphylaxis, Nausea And Vomiting   Severe nausea and  vomiting   Aminophylline Itching, Swelling, Rash   Pt experienced burning urination, itching, redness/rash, and swelling of genitals after given this medication via IV push.   Iodinated Diagnostic Agents Swelling, Other (See Comments)   SWELLING REACTION UNSPECIFIED  fFLUSHING   Latex Other (See Comments)   Causes blisters   Oxybutynin Chloride Other (See Comments)   "blisters"   Propoxyphene    Vancomycin Hives   06/02/2018- Hives arm, back and chest   Adhesive [tape] Rash   Paper tape is ok   Betadine [povidone Iodine] Rash   Ciprofloxacin Hives   Codeine Nausea And Vomiting   .   Ranolazine Other (See Comments)   Constipation .        Medication List        Accurate as of September 05, 2021  9:24 AM. If you have any questions, ask your nurse or doctor.          STOP taking these medications    acetaminophen 500 MG tablet Commonly known as: TYLENOL Stopped by: Sandrea Hughs, NP   aspirin EC 81 MG tablet Stopped by: Sandrea Hughs, NP   bacitracin-polymyxin b ophthalmic ointment Commonly known as: POLYSPORIN Stopped by: Sandrea Hughs, NP       TAKE these medications    amLODipine 2.5 MG tablet Commonly known as: NORVASC Take 1 tablet (2.5 mg total) by mouth daily.   Boudreauxs Butt Paste 40 % ointment Generic drug: liver oil-zinc oxide Apply topically 2 (two) times daily.   cholecalciferol 25 MCG (1000 UNIT) tablet Commonly known as: VITAMIN D3 Take 1,000 Units by mouth daily.   Docusate Sodium 100 MG capsule Take 200 mg by mouth daily at 8 pm. If no BM patient will also take 2 in the pm   ezetimibe 10 MG tablet Commonly known as: ZETIA TAKE 1 TABLET(10 MG) BY MOUTH DAILY   fluticasone 50 MCG/ACT nasal spray Commonly known as: FLONASE Place 2 sprays into both nostrils daily.   furosemide 80 MG tablet Commonly known as: LASIX Take 1 tablet (80 mg total) by mouth daily. TAKE AN EXTRA 63m in the afternoon if weight is over 190    isosorbide dinitrate 30 MG tablet Commonly known as: ISORDIL Take 60 mg by mouth daily at 12 noon. In the morning  ketoconazole 2 % shampoo Commonly known as: NIZORAL as needed.   Klor-Con M20 20 MEQ tablet Generic drug: potassium chloride SA Take 1 tablet (20 mEq total) by mouth See admin instructions. Take one tablet (20 meq) by mouth every morning and with each afternoon dose of lasix   levothyroxine 112 MCG tablet Commonly known as: SYNTHROID Take 112 mcg by mouth daily before breakfast.   OVER THE COUNTER MEDICATION Take 2 tablets by mouth daily. "Focus factor"   pravastatin 80 MG tablet Commonly known as: PRAVACHOL Take 80 mg by mouth at bedtime.        Review of Systems  Constitutional:  Negative for appetite change, chills, fatigue, fever and unexpected weight change.  HENT:  Negative for congestion, dental problem, ear discharge, ear pain, facial swelling, hearing loss, nosebleeds, postnasal drip, rhinorrhea, sinus pressure, sinus pain, sneezing, sore throat, tinnitus and trouble swallowing.   Eyes:  Negative for pain, discharge, redness, itching and visual disturbance.  Respiratory:  Negative for cough, chest tightness, shortness of breath and wheezing.   Cardiovascular:  Negative for chest pain, palpitations and leg swelling.  Gastrointestinal:  Negative for abdominal distention, abdominal pain, blood in stool, constipation, diarrhea, nausea and vomiting.  Endocrine: Negative for cold intolerance, heat intolerance, polydipsia, polyphagia and polyuria.  Genitourinary:  Negative for difficulty urinating, dysuria, flank pain, frequency and urgency.  Musculoskeletal:  Positive for arthralgias and gait problem. Negative for back pain, joint swelling, myalgias, neck pain and neck stiffness.       Right foot, left foot and left knee pain   Skin:  Negative for color change, pallor, rash and wound.  Neurological:  Negative for dizziness, syncope, speech difficulty,  weakness, light-headedness, numbness and headaches.  Hematological:  Does not bruise/bleed easily.  Psychiatric/Behavioral:  Negative for agitation, behavioral problems, confusion, hallucinations, self-injury, sleep disturbance and suicidal ideas. The patient is not nervous/anxious.    Immunization History  Administered Date(s) Administered   PFIZER(Purple Top)SARS-COV-2 Vaccination 11/11/2019, 11/13/2019, 12/08/2019   Pneumococcal Conjugate-13 06/14/2014   Pneumococcal Polysaccharide-23 10/26/2013, 03/30/2020   Tdap 12/19/2015   Pertinent  Health Maintenance Due  Topic Date Due   INFLUENZA VACCINE  Never done   DEXA SCAN  Completed   Fall Risk 06/02/2021 06/15/2021 06/20/2021 06/26/2021 09/05/2021  Falls in the past year? 0 0 - 1 0  Was there an injury with Fall? 0 0 - 1 0  Fall Risk Category Calculator 0 0 - 2 0  Fall Risk Category Low Low - Moderate Low  Patient Fall Risk Level Low fall risk Low fall risk Low fall risk Moderate fall risk Low fall risk  Patient at Risk for Falls Due to History of fall(s) History of fall(s) - History of fall(s) No Fall Risks  Fall risk Follow up Falls evaluation completed;Education provided;Falls prevention discussed Falls evaluation completed;Education provided;Falls prevention discussed - Falls evaluation completed Falls evaluation completed   Functional Status Survey:    Vitals:   09/05/21 0913  BP: 140/70  Pulse: 61  Resp: 16  Temp: 97.6 F (36.4 C)  SpO2: 96%  Weight: 195 lb 3.2 oz (88.5 kg)  Height: '5\' 5"'  (1.651 m)   Body mass index is 32.48 kg/m. Physical Exam Vitals reviewed.  Constitutional:      General: She is not in acute distress.    Appearance: Normal appearance. She is obese. She is not ill-appearing or diaphoretic.  HENT:     Head: Normocephalic.     Right Ear: Tympanic membrane, ear canal and  external ear normal. There is no impacted cerumen.     Left Ear: Tympanic membrane, ear canal and external ear normal. There is  no impacted cerumen.     Nose: Nose normal. No congestion or rhinorrhea.     Mouth/Throat:     Mouth: Mucous membranes are moist.     Pharynx: Oropharynx is clear. No oropharyngeal exudate or posterior oropharyngeal erythema.  Eyes:     General: No scleral icterus.       Right eye: No discharge.        Left eye: No discharge.     Extraocular Movements: Extraocular movements intact.     Conjunctiva/sclera: Conjunctivae normal.     Pupils: Pupils are equal, round, and reactive to light.  Neck:     Vascular: No carotid bruit.  Cardiovascular:     Rate and Rhythm: Normal rate and regular rhythm.     Pulses: Normal pulses.     Heart sounds: Normal heart sounds. No murmur heard.   No friction rub. No gallop.  Pulmonary:     Effort: Pulmonary effort is normal. No respiratory distress.     Breath sounds: Normal breath sounds. No wheezing, rhonchi or rales.  Chest:     Chest wall: No tenderness.  Abdominal:     General: Bowel sounds are normal. There is no distension.     Palpations: Abdomen is soft. There is no mass.     Tenderness: There is no abdominal tenderness. There is no right CVA tenderness, left CVA tenderness, guarding or rebound.  Musculoskeletal:        General: No swelling or tenderness.     Cervical back: Normal range of motion. No rigidity or tenderness.     Right lower leg: No edema.     Left lower leg: No edema.     Comments: RLE and knee brace in place   Lymphadenopathy:     Cervical: No cervical adenopathy.  Skin:    General: Skin is warm and dry.     Coloration: Skin is not pale.     Findings: No bruising, erythema, lesion or rash.  Neurological:     Mental Status: She is alert and oriented to person, place, and time.     Cranial Nerves: No cranial nerve deficit.     Sensory: No sensory deficit.     Motor: No weakness.     Coordination: Coordination normal.     Gait: Gait abnormal.  Psychiatric:        Mood and Affect: Mood normal.        Speech: Speech  normal.        Behavior: Behavior normal.        Thought Content: Thought content normal.        Judgment: Judgment normal.    Labs reviewed: Recent Labs    09/16/20 1747 02/27/21 0000 04/14/21 1541  NA 138 145 143  K 4.2 4.0 4.6  CL 99 103 102  CO2 29 34* 34*  GLUCOSE 98  --  89  BUN '15 20 17  ' CREATININE 0.91 0.9 0.83  CALCIUM 8.8* 9.0 10.1   Recent Labs    02/27/21 0000 04/14/21 1541  AST 40* 41  ALT 38* 24  ALKPHOS 89 90  BILITOT  --  1.0  PROT  --  6.8  ALBUMIN 3.8 3.6   Recent Labs    02/27/21 0000 04/03/21 1233 04/14/21 1541 06/26/21 0942  WBC 4.7 4.9 6.1 4.0  NEUTROABS 2.90  --  3.9 2,480  HGB 15.9 14.8 15.0 14.8  HCT 48* 46.6 45.1 44.7  MCV  --  100* 99.8 98.5  PLT 126* 128* 133* 115*   Lab Results  Component Value Date   TSH 1.92 02/27/2021   Lab Results  Component Value Date   HGBA1C 6.1 (H) 01/26/2015   Lab Results  Component Value Date   CHOL 155 02/27/2021   HDL 85 (A) 02/27/2021   LDLCALC 59 02/27/2021   TRIG 57 02/27/2021   CHOLHDL 4.1 11/18/2017    Significant Diagnostic Results in last 30 days:  No results found.  Assessment/Plan 1. Essential hypertension B/p not goal today but previous was normal.continue to monitor  - Advised to check Blood pressure at home and record on log provided and notify provider if B/p > 140/90  - will increase amlodipine from 2.5 mg tablet to 5 mg tablet daily if B/p > 140/90  - continue on amlodipine,lasix and Isordil  - CBC with Differential/Platelet  2. Chronic diastolic CHF (congestive heart failure) (HCC) No signs of fluid overload  - continue on Furosemide  - CBC with Differential/Platelet  3. Dyslipidemia Continue on Pravastatin  - Lipid Panel  4. Acquired hypothyroidism Lab Results  Component Value Date   TSH 1.92 02/27/2021  - continue on levothyroxine 88 mcg tablet daily on empty stomach  - TSH  5. PAD (peripheral artery disease) (HCC) Continue on pravastatin   6. Pain of  right lower extremity Continue current pain regimen  - continue to follow up with Orthopedic   7. Pain of left foot Pain controlled  - continue to follow up with orthopedic   Family/ staff Communication: Reviewed plan of care with patient verbalized understanding.   Labs/tests ordered:  - CBC with Differential/Platelet - CMP with eGFR([facility]) - TSH - Lipid panel  Next Appointment : 6 months for medical management of chronic issues with same day Fasting Labs.   Sandrea Hughs, NP

## 2021-09-06 LAB — BASIC METABOLIC PANEL WITH GFR
BUN: 15 mg/dL (ref 7–25)
CO2: 32 mmol/L (ref 20–32)
Calcium: 9.4 mg/dL (ref 8.6–10.4)
Chloride: 103 mmol/L (ref 98–110)
Creat: 0.71 mg/dL (ref 0.60–0.95)
Glucose, Bld: 101 mg/dL (ref 65–139)
Potassium: 4.1 mmol/L (ref 3.5–5.3)
Sodium: 141 mmol/L (ref 135–146)
eGFR: 83 mL/min/{1.73_m2} (ref >=60)

## 2021-09-06 LAB — CBC WITH DIFFERENTIAL/PLATELET
Absolute Monocytes: 363 cells/uL (ref 200–950)
Basophils Absolute: 83 cells/uL (ref 0–200)
Basophils Relative: 1.5 %
Eosinophils Absolute: 627 cells/uL — ABNORMAL HIGH (ref 15–500)
Eosinophils Relative: 11.4 %
HCT: 43.9 % (ref 35.0–45.0)
Hemoglobin: 14.6 g/dL (ref 11.7–15.5)
Lymphs Abs: 1309 cells/uL (ref 850–3900)
MCH: 33.1 pg — ABNORMAL HIGH (ref 27.0–33.0)
MCHC: 33.3 g/dL (ref 32.0–36.0)
MCV: 99.5 fL (ref 80.0–100.0)
MPV: 11.1 fL (ref 7.5–12.5)
Monocytes Relative: 6.6 %
Neutro Abs: 3119 cells/uL (ref 1500–7800)
Neutrophils Relative %: 56.7 %
Platelets: 118 10*3/uL — ABNORMAL LOW (ref 140–400)
RBC: 4.41 10*6/uL (ref 3.80–5.10)
RDW: 11.9 % (ref 11.0–15.0)
Total Lymphocyte: 23.8 %
WBC: 5.5 10*3/uL (ref 3.8–10.8)

## 2021-09-06 LAB — TSH: TSH: 0.28 mIU/L — ABNORMAL LOW (ref 0.40–4.50)

## 2021-09-06 LAB — LIPID PANEL
Cholesterol: 136 mg/dL (ref <200)
HDL: 69 mg/dL (ref >=50)
LDL Cholesterol (Calc): 51 mg/dL (calc)
Non-HDL Cholesterol (Calc): 67 mg/dL (calc) (ref <130)
Total CHOL/HDL Ratio: 2 (calc) (ref <5.0)
Triglycerides: 78 mg/dL (ref <150)

## 2021-09-07 ENCOUNTER — Other Ambulatory Visit: Payer: Self-pay

## 2021-09-07 DIAGNOSIS — E039 Hypothyroidism, unspecified: Secondary | ICD-10-CM

## 2021-09-07 MED ORDER — LEVOTHYROXINE SODIUM 88 MCG PO TABS: 88.0000 ug | ORAL_TABLET | Freq: Every day | ORAL | 1 refills | Status: DC

## 2021-09-21 ENCOUNTER — Telehealth: Payer: Self-pay

## 2021-09-21 ENCOUNTER — Other Ambulatory Visit: Payer: Self-pay

## 2021-09-21 ENCOUNTER — Telehealth (INDEPENDENT_AMBULATORY_CARE_PROVIDER_SITE_OTHER): Payer: HMO | Admitting: Nurse Practitioner

## 2021-09-21 DIAGNOSIS — J0140 Acute pansinusitis, unspecified: Secondary | ICD-10-CM

## 2021-09-21 NOTE — Progress Notes (Signed)
This service is provided via telemedicine  No vital signs collected/recorded due to the encounter was a telemedicine visit.   Location of patient (ex: home, work):  Home  Patient consents to a telephone visit:  Yes, see encounter dated 09/21/2021  Location of the provider (ex: office, home):  [place]  Name of any referring provider:  [name], Dinah, NP  Names of all persons participating in the telemedicine service and their role in the encounter:  Sherrie Mustache, Nurse Practitioner, Carroll Kinds, CMA, and patient.   Time spent on call:  9 minutes with medical assistant

## 2021-09-21 NOTE — Progress Notes (Signed)
Careteam: Patient Care Team: [name], [name], NP as PCP - General (Family Medicine) Croitoru, Dani Gobble, MD as PCP - Cardiology (Cardiology) Croitoru, Dani Gobble, MD as Consulting Physician (Cardiology) Druscilla Brownie, MD as Referring Physician (Dermatology) [name], DPM as Consulting Physician (Podiatry) [name], [name], [place] (Optometry) [name], MD as Referring Physician (Gynecology)  Advanced Directive information    Allergies  Allergen Reactions   Atorvastatin Anaphylaxis    Patient does not remember; caused pneumonia- took her off of it per patient.    Penicillins Anaphylaxis, Hives and Swelling    Tongue swelling Did it involve swelling of the face/tongue/throat, SOB, or low BP? Yes Did it involve sudden or severe rash/hives, skin peeling, or any reaction on the inside of your mouth or nose? Yes Did you need to seek medical attention at a hospital or doctor's office? Yes When did it last happen?  74 or 88 year old    If all above answers are NO, may proceed with cephalosporin use.    Shellfish Allergy Anaphylaxis and Nausea And Vomiting    Severe nausea and vomiting   Aminophylline Itching, Swelling and Rash    Pt experienced burning urination, itching, redness/rash, and swelling of genitals after given this medication via IV push.   Iodinated Contrast Media Swelling and Other (See Comments)    SWELLING REACTION UNSPECIFIED  fFLUSHING   Latex Other (See Comments)    Causes blisters   Oxybutynin Chloride Other (See Comments)    "blisters"   Propoxyphene    Vancomycin Hives    06/02/2018- Hives arm, back and chest   Adhesive [Tape] Rash    Paper tape is ok   Betadine [Povidone Iodine] Rash   Ciprofloxacin Hives   Codeine Nausea And Vomiting    .   Ranolazine Other (See Comments)    Constipation  .    Chief Complaint  Patient presents with   Acute Visit    Patient complains of sinus drainage, sore throat and coigh. Patient has had symptoms  for about 2 weeks. Started with just cough. Patient ha been spitting up for about 2 to 3 days. Patient has not had any fever. Throat is completely raw. Has real bad head congestion. Sinuses drain and she spits it out. Drainage seems to be clear     HPI: Patient is a 88 y.o. female  Did not get text msg to start mychart visit therefore telephone visit was done.  Pt with sinus drain, sore throat and cough for 1 week. Over the last few days it has gotten worse.  Last night took a sinus pill.- did not sleep  Vit d d, zinc and sodium bicarb- in sinus pill, reports herbal.  States her eyes are weak due to sinuses.  No chest congestion- all in sinuses.  Drinking hot drinks helps throat.   Review of Systems:  Review of Systems  Constitutional:  Negative for chills, fever and malaise/fatigue.  HENT:  Positive for congestion and sore throat. Negative for hearing loss, sinus pain and tinnitus.   Respiratory:  Positive for cough and sputum production. Negative for shortness of breath.   Cardiovascular:  Negative for chest pain.  Neurological:  Negative for dizziness and headaches.   Past Medical History:  Diagnosis Date   Arthritis    right knee   Cancer ([place]) yrs ago   skin cancer removed from face   Chronic diastolic CHF (congestive heart failure) (HCC)    Chronic venous insufficiency  LEA VENOUS, 10/17/2011 - mild reflux in bilateral common femoral veins   CKD (chronic kidney disease), stage III ([place])    Coronary artery disease    a. s/p PCI/BMS to prox LAD and balloon angioplasty to mLAD with suboptimal result in 2000. b. Abnl nuc 2012, cath 09/2011 - showed that the overall territory of potential ischemia was small and attributable to a moderately diseased small second diagonal artery. Med rx.   Dyspnea    with activity   Headache    History of blood transfusion 2017   Hyperlipidemia    Hypertension    Hypertensive heart disease    Hypothyroidism    Obesity    Pneumonia    several  times   Stroke [facility])    pt. states she had "light stroke" in sept. 1980   Urinary incontinence    Past Surgical History:  Procedure Laterality Date   ABDOMINAL HYSTERECTOMY  1970's   complete   ANKLE ARTHROSCOPY Right 07/10/2017   Procedure: ANKLE ARTHROSCOPY;  Surgeon: [name], DPM;  Location: [place];  Service: Podiatry;  Laterality: Right;   BACK SURGERY  07/26/2016   cervical neck surgery, [facility]   BLADDER SURGERY  2010   WITH MESH  bladder tach   bunion removal surgery Bilateral 15 yrs ago   CARDIAC CATHETERIZATION Left 09/25/2011   Medical management   CARDIAC CATHETERIZATION Left 03/25/2001   Normal LV function, LAD residual narrowing of less than 10%, normal ramus intermediate, circumflex, and RCA,    CARDIAC CATHETERIZATION  09/04/1999   LAD, 3x47mm Tetra stent resulting in a reduction of the 80% stenosis to 0% residual   CARDIAC CATHETERIZATION N/A 01/26/2015   Procedure: Right/Left Heart Cath and Coronary Angiography;  Surgeon: Troy Sine, MD;  Location: [place] [facility];  Service: Cardiovascular;  Laterality: N/A;   CARDIAC CATHETERIZATION N/A 01/27/2015   Procedure: Intravascular Pressure Wire/FFR Study;  Surgeon: Burnell Blanks, MD;  Location: [place] [facility];  Service: Cardiovascular;  Laterality: N/A;   CARDIAC CATHETERIZATION N/A 01/27/2015   Procedure: Right Heart Cath;  Surgeon: Burnell Blanks, MD;  Location: [place] [facility];  Service: Cardiovascular;  Laterality: N/A;   CHOLECYSTECTOMY     COLONOSCOPY WITH PROPOFOL N/A 03/07/2017   Procedure: COLONOSCOPY WITH PROPOFOL;  Surgeon: Juanita Craver, MD;  Location: [facility];  Service: Endoscopy;  Laterality: N/A;   CORONARY STENT PLACEMENT     LAD x 1   ganglion cyst removal  yrs ago   x 2   ILIAC VEIN ANGIOPLASTY / STENTING  02/15/2015   INTRAVASCULAR PRESSURE WIRE/FFR STUDY N/A 04/05/2017   Procedure: Intravascular Pressure Wire/FFR Study;  Surgeon: Martinique, Peter M,  MD;  Location: [place] [facility];  Service: Cardiovascular;  Laterality: N/A;   IVC FILTER INSERTION  2016   IVC FILTER REMOVAL  02/15/2015   at [place] CATH AND CORONARY ANGIOGRAPHY N/A 04/05/2017   Procedure: Left Heart Cath and Coronary Angiography;  Surgeon: Martinique, Peter M, MD;  Location: [place] [facility];  Service: Cardiovascular;  Laterality: N/A;   LEFT HEART CATHETERIZATION WITH CORONARY ANGIOGRAM N/A 09/25/2011   Procedure: LEFT HEART CATHETERIZATION WITH CORONARY ANGIOGRAM;  Surgeon: Sanda Klein, MD;  Location: [place] [facility];  Service: Cardiovascular;  Laterality: N/A;   multiple bladder surgeries to remove mesh     ROTATOR CUFF REPAIR Right    stent to groin Left 08/2014   left leg   TENDON REPAIR  Right 07/10/2017   Procedure: RIGHT PERONEAL TENDON REPAIR;  Surgeon: [name], DPM;  Location: [facility];  Service: Podiatry;  Laterality: Right;   TENDON REPAIR Right 05/27/2019   Procedure: PERONEAL TENDON REPAIR x2;  Surgeon: [name], DPM;  Location: [facility];  Service: Podiatry;  Laterality: Right;   Social History:   reports that she has never smoked. She has never used smokeless tobacco. She reports that she does not drink alcohol and does not use drugs.  Family History  Problem Relation Age of Onset   Cancer Mother    Heart disease Father    Heart disease Sister    High blood pressure Sister    Heart disease Sister    Cancer Sister    Heart disease Brother    Diabetes Brother    Brain cancer Brother    High blood pressure Brother    Heart disease Brother    Heart attack Brother    Heart disease Brother    High blood pressure Brother     Medications: Patient's Medications  New Prescriptions   No medications on file  Previous Medications   AMLODIPINE (NORVASC) 2.5 MG TABLET    Take 1 tablet (2.5 mg total) by mouth daily.   BOUDREAUXS BUTT PASTE 40 % OINTMENT    Apply topically 2 (two) times daily.   CHOLECALCIFEROL (VITAMIN D3) 25 MCG (1000  UNIT) TABLET    Take 1,000 Units by mouth daily.    DOCUSATE SODIUM 100 MG CAPSULE    Take 200 mg by mouth daily at 8 pm. If no BM patient will also take 2 in the pm   EZETIMIBE (ZETIA) 10 MG TABLET    TAKE 1 TABLET(10 MG) BY MOUTH DAILY   FLUTICASONE (FLONASE) 50 MCG/ACT NASAL SPRAY    Place 2 sprays into both nostrils daily.   FUROSEMIDE (LASIX) 80 MG TABLET    Take 1 tablet (80 mg total) by mouth daily. TAKE AN EXTRA 80mg  in the afternoon if weight is over 190   ISOSORBIDE DINITRATE (ISORDIL) 30 MG TABLET    Take 60 mg by mouth daily at 12 noon. In the morning   KETOCONAZOLE (NIZORAL) 2 % SHAMPOO    as needed.   KLOR-CON M20 20 MEQ TABLET    Take 1 tablet (20 mEq total) by mouth See admin instructions. Take one tablet (20 meq) by mouth every morning and with each afternoon dose of lasix   LEVOTHYROXINE (SYNTHROID) 88 MCG TABLET    Take 1 tablet (88 mcg total) by mouth daily.   OVER THE COUNTER MEDICATION    Take 2 tablets by mouth daily. "Focus factor"   PRAVASTATIN (PRAVACHOL) 80 MG TABLET    Take 80 mg by mouth at bedtime.  Modified Medications   No medications on file  Discontinued Medications   No medications on file    Physical Exam:  There were no vitals filed for this visit. There is no height or weight on file to calculate BMI. Wt Readings from Last 3 Encounters:  09/05/21 195 lb 3.2 oz (88.5 kg)  09/01/21 193 lb 6.4 oz (87.7 kg)  06/26/21 189 lb 6.4 oz (85.9 kg)    Physical Exam Neurological:     Mental Status: She is alert.  Oriented to self and situation.    Labs reviewed: Basic Metabolic Panel: Recent Labs    02/27/21 0000 04/14/21 1541 09/05/21 1008  NA 145 143 141  K 4.0 4.6 4.1  CL 103  102 103  CO2 34* 34* 32  GLUCOSE  --  89 101  BUN 20 17 15   CREATININE 0.9 0.83 0.71  CALCIUM 9.0 10.1 9.4  TSH 1.92  --  0.28*   Liver Function Tests: Recent Labs    02/27/21 0000 04/14/21 1541  AST 40* 41  ALT 38* 24  ALKPHOS 89 90  BILITOT  --  1.0  PROT   --  6.8  ALBUMIN 3.8 3.6   No results for input(s): LIPASE, AMYLASE in the last 8760 hours. No results for input(s): AMMONIA in the last 8760 hours. CBC: Recent Labs    04/14/21 1541 06/26/21 0942 09/05/21 1008  WBC 6.1 4.0 5.5  NEUTROABS 3.9 2,480 3,119  HGB 15.0 14.8 14.6  HCT 45.1 44.7 43.9  MCV 99.8 98.5 99.5  PLT 133* 115* 118*   Lipid Panel: Recent Labs    02/27/21 0000 09/05/21 1008  CHOL 155 136  HDL 85* 69  LDLCALC 59 51  TRIG 57 78  CHOLHDL  --  2.0   TSH: Recent Labs    02/27/21 0000 09/05/21 1008  TSH 1.92 0.28*   A1C: Lab Results  Component Value Date   HGBA1C 6.1 (H) 01/26/2015     Assessment/Plan 1. Acute non-recurrent pansinusitis -Netipot or saline wash daily Plain nasal saline spray throughout the day as needed May use tylenol 500 mg 2 tablets every 8 hours as needed aches and pains or sore throat humidifier in the home to help with the dry air Mucinex DM by mouth twice daily as needed for cough and chest congestion with full glass of water  Keep well hydrated Proper nutrition  Avoid forcefully blowing nose Vit c 1000 mg daily Vit d 2000 units daily  Zinc 50 mg daily  -strict follow up precautions given.   Carlos American. Harle Battiest  [facility] [phone]    Virtual Visit via telephone visit  I connected with patient on 09/21/21 at  9:30 AM EST by telephone and verified that I am speaking with the correct person using two identifiers.  Location: Patient: home Provider: [place]    I discussed the limitations, risks, security and privacy concerns of performing an evaluation and management service by telephone and the availability of in person appointments. I also discussed with the patient that there may be a patient responsible charge related to this service. The patient expressed understanding and agreed to proceed.   I discussed the assessment and treatment plan with the patient. The patient  was provided an opportunity to ask questions and all were answered. The patient agreed with the plan and demonstrated an understanding of the instructions.   The patient was advised to call back or seek an in-person evaluation if the symptoms worsen or if the condition fails to improve as anticipated.  I provided 15 minutes of non-face-to-face time during this encounter.  Carlos American. Harle Battiest Avs printed and mailed

## 2021-09-21 NOTE — Telephone Encounter (Signed)
[name], Jill Preston are scheduled for a virtual visit with your provider today.    Just as we do with appointments in the office, we must obtain your consent to participate.  Your consent will be active for this visit and any virtual visit you may have with one of our providers in the next 365 days.    If you have a MyChart account, I can also send a copy of this consent to you electronically.  All virtual visits are billed to your insurance company just like a traditional visit in the office.  As this is a virtual visit, video technology does not allow for your provider to perform a traditional examination.  This may limit your provider's ability to fully assess your condition.  If your provider identifies any concerns that need to be evaluated in person or the need to arrange testing such as labs, EKG, etc, we will make arrangements to do so.    Although advances in technology are sophisticated, we cannot ensure that it will always work on either your end or our end.  If the connection with a video visit is poor, we may have to switch to a telephone visit.  With either a video or telephone visit, we are not always able to ensure that we have a secure connection.   I need to obtain your verbal consent now.   Are you willing to proceed with your visit today?   Jill Preston has provided verbal consent on 09/21/2021 for a virtual visit (video or telephone).   Carroll Kinds, [facility] 09/21/2021  9:39 AM

## 2021-09-22 ENCOUNTER — Encounter: Payer: Self-pay | Admitting: Family

## 2021-09-22 ENCOUNTER — Ambulatory Visit (INDEPENDENT_AMBULATORY_CARE_PROVIDER_SITE_OTHER): Payer: HMO | Admitting: Family

## 2021-09-22 ENCOUNTER — Other Ambulatory Visit: Payer: Self-pay

## 2021-09-22 VITALS — BP 100/58 | HR 68 | Temp 98.2°F | Resp 16 | Ht 65.0 in

## 2021-09-22 DIAGNOSIS — R0989 Other specified symptoms and signs involving the circulatory and respiratory systems: Secondary | ICD-10-CM | POA: Diagnosis not present

## 2021-09-22 DIAGNOSIS — J3489 Other specified disorders of nose and nasal sinuses: Secondary | ICD-10-CM | POA: Diagnosis not present

## 2021-09-22 DIAGNOSIS — R051 Acute cough: Secondary | ICD-10-CM

## 2021-09-22 DIAGNOSIS — J029 Acute pharyngitis, unspecified: Secondary | ICD-10-CM | POA: Diagnosis not present

## 2021-09-22 DIAGNOSIS — R519 Headache, unspecified: Secondary | ICD-10-CM | POA: Diagnosis not present

## 2021-09-22 LAB — POCT INFLUENZA A/B
Influenza A, POC: NEGATIVE
Influenza B, POC: NEGATIVE

## 2021-09-22 LAB — POCT RAPID STREP A (OFFICE): Rapid Strep A Screen: NEGATIVE

## 2021-09-22 MED ORDER — DOXYCYCLINE HYCLATE 100 MG PO TABS: 100.0000 mg | ORAL_TABLET | Freq: Two times a day (BID) | ORAL | 0 refills | Status: DC

## 2021-09-22 MED ORDER — ZINC GLUCONATE 50 MG PO TABS
50.0000 mg | ORAL_TABLET | Freq: Every day | ORAL | 0 refills | Status: AC
Start: 2021-09-22 — End: 2021-10-06

## 2021-09-22 NOTE — Progress Notes (Signed)
Provider: Marlowe Sax FNP-C  Amaru Burroughs, [name], NP  Patient Care Team: Pheobe Sandiford, [name], NP as PCP - General (Family Medicine) Croitoru, Dani Gobble, MD as PCP - Cardiology (Cardiology) Sanda Klein, MD as Consulting Physician (Cardiology) Druscilla Brownie, MD as Referring Physician (Dermatology) [name], DPM as Consulting Physician (Podiatry) [name], [name], [place] (Optometry) [name], MD as Referring Physician (Gynecology)  Extended Emergency Contact Information Primary Emergency Contact: Gatz,Lewis "Meribeth Mattes [place] Phone: [phone] Mobile Phone: [phone] Relation: Son Secondary Emergency Contact: [facility] Phone: [phone] Relation: Friend  Code Status:  Full Code  Goals of care: Advanced Directive information Advanced Directives 09/22/2021  Does Patient Have a Medical Advance Directive? Yes  Type of Advance Directive Out of facility DNR (pink MOST or yellow form)  Does patient want to make changes to medical advance directive? No - Patient declined  Copy of [place] in Chart? -  Would patient like information on creating a medical advance directive? -     Chief Complaint  Patient presents with   Acute Visit    Patient complains of nasal drainage, chest congestion, headache, and sore throat.    HPI:  Pt is a 88 y.o. female seen today for an acute visit for evaluation of sore throat ,nasal congestion,headache and chest congestin x 4 days.denies any fever or chills. Has taken tylenol very early this morning.      Past Medical History:  Diagnosis Date   Arthritis    right knee   Cancer ([place]) yrs ago   skin cancer removed from face   Chronic diastolic CHF (congestive heart failure) (HCC)    Chronic venous insufficiency    LEA VENOUS, 10/17/2011 - mild reflux in bilateral common femoral veins   CKD (chronic kidney disease), stage III ([place])    Coronary artery  disease    a. s/p PCI/BMS to prox LAD and balloon angioplasty to mLAD with suboptimal result in 2000. b. Abnl nuc 2012, cath 09/2011 - showed that the overall territory of potential ischemia was small and attributable to a moderately diseased small second diagonal artery. Med rx.   Dyspnea    with activity   Headache    History of blood transfusion 2017   Hyperlipidemia    Hypertension    Hypertensive heart disease    Hypothyroidism    Obesity    Pneumonia    several times   Stroke [facility])    pt. states she had "light stroke" in sept. 1980   Urinary incontinence    Past Surgical History:  Procedure Laterality Date   ABDOMINAL HYSTERECTOMY  1970's   complete   ANKLE ARTHROSCOPY Right 07/10/2017   Procedure: ANKLE ARTHROSCOPY;  Surgeon: [name], DPM;  Location: [place];  Service: Podiatry;  Laterality: Right;   BACK SURGERY  07/26/2016   cervical neck surgery, [facility]   BLADDER SURGERY  2010   WITH MESH  bladder tach   bunion removal surgery Bilateral 15 yrs ago   CARDIAC CATHETERIZATION Left 09/25/2011   Medical management   CARDIAC CATHETERIZATION Left 03/25/2001   Normal LV function, LAD residual narrowing of less than 10%, normal ramus intermediate, circumflex, and RCA,    CARDIAC CATHETERIZATION  09/04/1999   LAD, 3x7mm Tetra stent resulting in a reduction of the 80% stenosis to 0% residual   CARDIAC CATHETERIZATION N/A 01/26/2015   Procedure: Right/Left Heart Cath and  Coronary Angiography;  Surgeon: Troy Sine, MD;  Location: [place] CV LAB;  Service: Cardiovascular;  Laterality: N/A;   CARDIAC CATHETERIZATION N/A 01/27/2015   Procedure: Intravascular Pressure Wire/FFR Study;  Surgeon: Burnell Blanks, MD;  Location: [place] [facility];  Service: Cardiovascular;  Laterality: N/A;   CARDIAC CATHETERIZATION N/A 01/27/2015   Procedure: Right Heart Cath;  Surgeon: Burnell Blanks, MD;  Location: [place] CV LAB;  Service: Cardiovascular;   Laterality: N/A;   CHOLECYSTECTOMY     COLONOSCOPY WITH PROPOFOL N/A 03/07/2017   Procedure: COLONOSCOPY WITH PROPOFOL;  Surgeon: Juanita Craver, MD;  Location: [facility];  Service: Endoscopy;  Laterality: N/A;   CORONARY STENT PLACEMENT     LAD x 1   ganglion cyst removal  yrs ago   x 2   ILIAC VEIN ANGIOPLASTY / STENTING  02/15/2015   INTRAVASCULAR PRESSURE WIRE/FFR STUDY N/A 04/05/2017   Procedure: Intravascular Pressure Wire/FFR Study;  Surgeon: Martinique, Peter M, MD;  Location: [place] CV LAB;  Service: Cardiovascular;  Laterality: N/A;   IVC FILTER INSERTION  2016   IVC FILTER REMOVAL  02/15/2015   at [place] CATH AND CORONARY ANGIOGRAPHY N/A 04/05/2017   Procedure: Left Heart Cath and Coronary Angiography;  Surgeon: Martinique, Peter M, MD;  Location: [place] [facility] LAB;  Service: Cardiovascular;  Laterality: N/A;   LEFT HEART CATHETERIZATION WITH CORONARY ANGIOGRAM N/A 09/25/2011   Procedure: LEFT HEART CATHETERIZATION WITH CORONARY ANGIOGRAM;  Surgeon: Sanda Klein, MD;  Location: [place] CATH LAB;  Service: Cardiovascular;  Laterality: N/A;   multiple bladder surgeries to remove mesh     ROTATOR CUFF REPAIR Right    stent to groin Left 08/2014   left leg   TENDON REPAIR Right 07/10/2017   Procedure: RIGHT PERONEAL TENDON REPAIR;  Surgeon: [name], DPM;  Location: [place];  Service: Podiatry;  Laterality: Right;   TENDON REPAIR Right 05/27/2019   Procedure: PERONEAL TENDON REPAIR x2;  Surgeon: [name], DPM;  Location: [facility];  Service: Podiatry;  Laterality: Right;    Allergies  Allergen Reactions   Atorvastatin Anaphylaxis    Patient does not remember; caused pneumonia- took her off of it per patient.    Penicillins Anaphylaxis, Hives and Swelling    Tongue swelling Did it involve swelling of the face/tongue/throat, SOB, or low BP? Yes Did it involve sudden or severe rash/hives, skin peeling, or any reaction on the inside of your mouth or nose? Yes Did  you need to seek medical attention at a hospital or doctor's office? Yes When did it last happen?  85 or 88 year old    If all above answers are NO, may proceed with cephalosporin use.    Shellfish Allergy Anaphylaxis and Nausea And Vomiting    Severe nausea and vomiting   Aminophylline Itching, Swelling and Rash    Pt experienced burning urination, itching, redness/rash, and swelling of genitals after given this medication via IV push.   Iodinated Contrast Media Swelling and Other (See Comments)    SWELLING REACTION UNSPECIFIED  fFLUSHING   Latex Other (See Comments)    Causes blisters   Oxybutynin Chloride Other (See Comments)    "blisters"   Propoxyphene    Vancomycin Hives    06/02/2018- Hives arm, back and chest   Adhesive [Tape] Rash    Paper tape is ok   Betadine [Povidone Iodine] Rash   Ciprofloxacin Hives   Codeine Nausea And Vomiting    .  Ranolazine Other (See Comments)    Constipation  .    Outpatient Encounter Medications as of 09/22/2021  Medication Sig   amLODipine (NORVASC) 2.5 MG tablet Take 1 tablet (2.5 mg total) by mouth daily.   BOUDREAUXS BUTT PASTE 40 % ointment Apply topically 2 (two) times daily.   cholecalciferol (VITAMIN D3) 25 MCG (1000 UNIT) tablet Take 1,000 Units by mouth daily.    Docusate Sodium 100 MG capsule Take 200 mg by mouth daily at 8 pm. If no BM patient will also take 2 in the pm   ezetimibe (ZETIA) 10 MG tablet TAKE 1 TABLET(10 MG) BY MOUTH DAILY   fluticasone (FLONASE) 50 MCG/ACT nasal spray Place 2 sprays into both nostrils daily.   furosemide (LASIX) 80 MG tablet Take 1 tablet (80 mg total) by mouth daily. TAKE AN EXTRA 80mg  in the afternoon if weight is over 190   isosorbide dinitrate (ISORDIL) 30 MG tablet Take 60 mg by mouth daily at 12 noon. In the morning   ketoconazole (NIZORAL) 2 % shampoo as needed.   KLOR-CON M20 20 MEQ tablet Take 1 tablet (20 mEq total) by mouth See admin instructions. Take one tablet (20 meq) by mouth  every morning and with each afternoon dose of lasix   levothyroxine (SYNTHROID) 88 MCG tablet Take 1 tablet (88 mcg total) by mouth daily.   OVER THE COUNTER MEDICATION Take 2 tablets by mouth daily. "Focus factor"   pravastatin (PRAVACHOL) 80 MG tablet Take 80 mg by mouth at bedtime.   Facility-Administered Encounter Medications as of 09/22/2021  Medication   betamethasone acetate-betamethasone sodium phosphate (CELESTONE) injection 6 mg    Review of Systems  Constitutional:  Negative for appetite change, chills, fatigue and fever.  HENT:  Positive for congestion, rhinorrhea, sinus pressure and sore throat. Negative for sneezing.   Respiratory:  Positive for cough. Negative for chest tightness, shortness of breath and wheezing.   Gastrointestinal:  Negative for abdominal distention, abdominal pain, constipation, diarrhea, nausea and vomiting.  Neurological:  Positive for headaches.   Immunization History  Administered Date(s) Administered   PFIZER(Purple Top)SARS-COV-2 Vaccination 11/11/2019, 11/13/2019, 12/08/2019   Pneumococcal Conjugate-13 06/14/2014   Pneumococcal Polysaccharide-23 10/26/2013, 03/30/2020   Tdap 12/19/2015   Pertinent  Health Maintenance Due  Topic Date Due   INFLUENZA VACCINE  12/15/2021 (Originally 04/17/2021)   DEXA SCAN  Completed   Fall Risk 06/15/2021 06/20/2021 06/26/2021 09/05/2021 09/22/2021  Falls in the past year? 0 - 1 0 0  Was there an injury with Fall? 0 - 1 0 0  Fall Risk Category Calculator 0 - 2 0 0  Fall Risk Category Low - Moderate Low Low  Patient Fall Risk Level Low fall risk Low fall risk Moderate fall risk Low fall risk Low fall risk  Patient at Risk for Falls Due to History of fall(s) - History of fall(s) No Fall Risks No Fall Risks  Fall risk Follow up Falls evaluation completed;Education provided;Falls prevention discussed - Falls evaluation completed Falls evaluation completed Falls evaluation completed   Functional Status Survey:     Vitals:   09/22/21 1326  BP: (!) 100/58  Pulse: 68  Resp: 16  Temp: 98.2 F (36.8 C)  SpO2: 94%  Height: 5\' 5"  (1.651 m)   Body mass index is 32.48 kg/m. Physical Exam Vitals reviewed.  Constitutional:      General: She is not in acute distress.    Appearance: Normal appearance. She is normal weight. She is not ill-appearing or diaphoretic.  HENT:     Head: Normocephalic.     Right Ear: Tympanic membrane, ear canal and external ear normal. There is no impacted cerumen.     Left Ear: Tympanic membrane, ear canal and external ear normal. There is no impacted cerumen.     Nose: No congestion or rhinorrhea.     Right Sinus: Maxillary sinus tenderness present.     Left Sinus: Maxillary sinus tenderness present.     Mouth/Throat:     Mouth: Mucous membranes are moist.     Pharynx: Posterior oropharyngeal erythema present. No oropharyngeal exudate.     Comments: Hoarseness noted  Eyes:     General: No scleral icterus.       Right eye: No discharge.        Left eye: No discharge.     Extraocular Movements: Extraocular movements intact.     Conjunctiva/sclera: Conjunctivae normal.     Pupils: Pupils are equal, round, and reactive to light.  Neck:     Vascular: No carotid bruit.  Cardiovascular:     Rate and Rhythm: Normal rate and regular rhythm.     Pulses: Normal pulses.     Heart sounds: Normal heart sounds. No murmur heard.   No friction rub. No gallop.  Pulmonary:     Effort: Pulmonary effort is normal. No respiratory distress.     Breath sounds: Normal breath sounds. No wheezing, rhonchi or rales.  Chest:     Chest wall: No tenderness.  Abdominal:     General: Bowel sounds are normal. There is no distension.     Palpations: Abdomen is soft. There is no mass.     Tenderness: There is no abdominal tenderness. There is no right CVA tenderness, left CVA tenderness, guarding or rebound.  Musculoskeletal:        General: No swelling or tenderness. Normal range of  motion.     Cervical back: Normal range of motion. No rigidity or tenderness.     Right lower leg: No edema.     Left lower leg: No edema.     Comments: Bilateral feet/ankle brace in place   Lymphadenopathy:     Cervical: No cervical adenopathy.  Skin:    General: Skin is warm and dry.     Coloration: Skin is not pale.     Findings: No bruising, erythema, lesion or rash.  Neurological:     Mental Status: She is alert and oriented to person, place, and time.     Cranial Nerves: No cranial nerve deficit.     Sensory: No sensory deficit.     Motor: No weakness.     Coordination: Coordination normal.     Gait: Gait normal.  Psychiatric:        Mood and Affect: Mood normal.        Speech: Speech normal.        Behavior: Behavior normal.    Labs reviewed: Recent Labs    02/27/21 0000 04/14/21 1541 09/05/21 1008  NA 145 143 141  K 4.0 4.6 4.1  CL 103 102 103  CO2 34* 34* 32  GLUCOSE  --  89 101  BUN 20 17 15   CREATININE 0.9 0.83 0.71  CALCIUM 9.0 10.1 9.4   Recent Labs    02/27/21 0000 04/14/21 1541  AST 40* 41  ALT 38* 24  ALKPHOS 89 90  BILITOT  --  1.0  PROT  --  6.8  ALBUMIN 3.8 3.6   Recent Labs  04/14/21 1541 06/26/21 0942 09/05/21 1008  WBC 6.1 4.0 5.5  NEUTROABS 3.9 2,480 3,119  HGB 15.0 14.8 14.6  HCT 45.1 44.7 43.9  MCV 99.8 98.5 99.5  PLT 133* 115* 118*   Lab Results  Component Value Date   TSH 0.28 (L) 09/05/2021   Lab Results  Component Value Date   HGBA1C 6.1 (H) 01/26/2015   Lab Results  Component Value Date   CHOL 136 09/05/2021   HDL 69 09/05/2021   LDLCALC 51 09/05/2021   TRIG 78 09/05/2021   CHOLHDL 2.0 09/05/2021    Significant Diagnostic Results in last 30 days:  No results found.  Assessment/Plan  1. Sore throat Afebrile  - posterior erythema  - POC Influenza A/B results negative  - POC Rapid Strep A results negative  - SARS-COV-2 RNA,(COVID-19) QUAL NAAT  2. Chest congestion Bilateral lung clear  - POC  Influenza A/B - POC Rapid Strep A - SARS-COV-2 RNA,(COVID-19) QUAL NAAT  3. Nasal drainage Bilateral maxillary tenderness  - POC Influenza A/B results negative  - POC Rapid Strep A results negative  - SARS-COV-2 RNA,(COVID-19) QUAL NAAT - start on Doxycycline 100 mg tablet one by mouth twice daily x 10 days   4. Nonintractable headache, unspecified chronicity pattern, unspecified headache type Suspect from due to ongoing URI   - POC Influenza A/B - POC Rapid Strep A - SARS-COV-2 RNA,(COVID-19) QUAL NAAT  5. Acute cough Afebrile  Lungs clear  - zinc gluconate 50 MG tablet; Take 1 tablet (50 mg total) by mouth daily for 14 days.  Dispense: 14 tablet; Refill: 0   Family/ staff Communication: Reviewed plan of care with patient verbalized understanding   Labs/tests ordered:  - POC Influenza A/B - POC Rapid Strep A - SARS-COV-2 RNA,(COVID-19) QUAL NAAT  Next Appointment: As needed if symptoms worsen or fail to improve    Sandrea Hughs, NP

## 2021-09-22 NOTE — Patient Instructions (Addendum)
-   Gurgle throat with warm water and salt  - Notify provider if symptoms worsen or fail to improve  - continue on tylenol as needed for pain or fever/chills

## 2021-09-23 ENCOUNTER — Other Ambulatory Visit: Payer: Self-pay | Admitting: Nurse Practitioner

## 2021-09-23 ENCOUNTER — Telehealth: Payer: Self-pay | Admitting: Nurse Practitioner

## 2021-09-23 DIAGNOSIS — R051 Acute cough: Secondary | ICD-10-CM

## 2021-09-23 LAB — SARS-COV-2 RNA,(COVID-19) QUALITATIVE NAAT: SARS CoV2 RNA: NOT DETECTED

## 2021-09-23 MED ORDER — DOXYCYCLINE HYCLATE 100 MG PO TABS: 100.0000 mg | ORAL_TABLET | Freq: Two times a day (BID) | ORAL | 0 refills | Status: AC

## 2021-09-23 NOTE — Telephone Encounter (Signed)
Pt son called on call requestion Rx for doxycycline that was prescribed yesterday sent to a [facility] on [address] due to not being able to get it from the original pharmacy Rx sent

## 2021-09-28 ENCOUNTER — Telehealth: Payer: Self-pay

## 2021-09-28 MED ORDER — LORATADINE 10 MG PO TABS: 10.0000 mg | ORAL_TABLET | Freq: Every day | ORAL | 0 refills | Status: DC

## 2021-09-28 NOTE — Telephone Encounter (Signed)
°  Below is Jill Preston's response (copied and pasted)  - complete prescribed antibiotics  - Take Loratadine 10 mg tablet one by mouth daily x 14 days for drainage  - will order CT scan of the head to evaluate headache.   Discussed above response with patient and she verbalized understanding and is in agreement with treatment plan

## 2021-09-28 NOTE — Telephone Encounter (Signed)
Patient was seen last week on 09/22/2021 by Dinah and is not feeling any better. Patient is still on antibiotic. Patient states her head is hurting and it feels like someone is playing ping pong in her head. Patient c/o ongoing drainage causing hoarseness.   Patient repeatedly stated " I feel terrible."  Patient also mentioned that she had a fall in October 2022 and is not sure if the head pain that she is experiencing is related to that. Patient had a relative die from a head injury following a fall and this is concerning.  Please advise

## 2021-10-05 ENCOUNTER — Encounter: Payer: Self-pay | Admitting: Family

## 2021-10-05 ENCOUNTER — Telehealth: Payer: Self-pay | Admitting: Family

## 2021-10-05 ENCOUNTER — Other Ambulatory Visit: Payer: Self-pay

## 2021-10-05 ENCOUNTER — Ambulatory Visit (INDEPENDENT_AMBULATORY_CARE_PROVIDER_SITE_OTHER): Payer: HMO | Admitting: Family

## 2021-10-05 VITALS — BP 130/60 | HR 64 | Temp 97.6°F | Resp 16 | Ht 65.0 in | Wt 191.0 lb

## 2021-10-05 DIAGNOSIS — J0101 Acute recurrent maxillary sinusitis: Secondary | ICD-10-CM | POA: Diagnosis not present

## 2021-10-05 DIAGNOSIS — R490 Dysphonia: Secondary | ICD-10-CM | POA: Diagnosis not present

## 2021-10-05 MED ORDER — AZITHROMYCIN 250 MG PO TABS: ORAL_TABLET | ORAL | 0 refills | Status: DC

## 2021-10-05 NOTE — Patient Instructions (Signed)
- Notify provider if symptoms worsen or fail to improve   Sinusitis, Adult Sinusitis is inflammation of your sinuses. Sinuses are hollow spaces in the bones around your face. Your sinuses are located: Around your eyes. In the middle of your forehead. Behind your nose. In your cheekbones. Mucus normally drains out of your sinuses. When your nasal tissues become inflamed or swollen, mucus can become trapped or blocked. This allows bacteria, viruses, and fungi to grow, which leads to infection. Most infections of the sinuses are caused by a virus. Sinusitis can develop quickly. It can last for up to 4 weeks (acute) or for more than 12 weeks (chronic). Sinusitis often develops after a cold. What are the causes? This condition is caused by anything that creates swelling in the sinuses or stops mucus from draining. This includes: Allergies. Asthma. Infection from bacteria or viruses. Deformities or blockages in your nose or sinuses. Abnormal growths in the nose (nasal polyps). Pollutants, such as chemicals or irritants in the air. Infection from fungi (rare). What increases the risk? You are more likely to develop this condition if you: Have a weak body defense system (immune system). Do a lot of swimming or diving. Overuse nasal sprays. Smoke. What are the signs or symptoms? The main symptoms of this condition are pain and a feeling of pressure around the affected sinuses. Other symptoms include: Stuffy nose or congestion. Thick drainage from your nose. Swelling and warmth over the affected sinuses. Headache. Upper toothache. A cough that may get worse at night. Extra mucus that collects in the throat or the back of the nose (postnasal drip). Decreased sense of smell and taste. Fatigue. A fever. Sore throat. Bad breath. How is this diagnosed? This condition is diagnosed based on: Your symptoms. Your medical history. A physical exam. Tests to find out if your condition is acute  or chronic. This may include: Checking your nose for nasal polyps. Viewing your sinuses using a device that has a light (endoscope). Testing for allergies or bacteria. Imaging tests, such as an MRI or CT scan. In rare cases, a bone biopsy may be done to rule out more serious types of fungal sinus disease. How is this treated? Treatment for sinusitis depends on the cause and whether your condition is chronic or acute. If caused by a virus, your symptoms should go away on their own within 10 days. You may be given medicines to relieve symptoms. They include: Medicines that shrink swollen nasal passages (topical intranasal decongestants). Medicines that treat allergies (antihistamines). A spray that eases inflammation of the nostrils (topical intranasal corticosteroids). Rinses that help get rid of thick mucus in your nose (nasal saline washes). If caused by bacteria, your health care provider may recommend waiting to see if your symptoms improve. Most bacterial infections will get better without antibiotic medicine. You may be given antibiotics if you have: A severe infection. A weak immune system. If caused by narrow nasal passages or nasal polyps, you may need to have surgery. Follow these instructions at home: Medicines Take, use, or apply over-the-counter and prescription medicines only as told by your health care provider. These may include nasal sprays. If you were prescribed an antibiotic medicine, take it as told by your health care provider. Do not stop taking the antibiotic even if you start to feel better. Hydrate and humidify  Drink enough fluid to keep your urine pale yellow. Staying hydrated will help to thin your mucus. Use a cool mist humidifier to keep the humidity level  in your home above 50%. Inhale steam for 10-15 minutes, 3-4 times a day, or as told by your health care provider. You can do this in the bathroom while a hot shower is running. Limit your exposure to cool or  dry air. Rest Rest as much as possible. Sleep with your head raised (elevated). Make sure you get enough sleep each night. General instructions  Apply a warm, moist washcloth to your face 3-4 times a day or as told by your health care provider. This will help with discomfort. Wash your hands often with soap and water to reduce your exposure to germs. If soap and water are not available, use hand sanitizer. Do not smoke. Avoid being around people who are smoking (secondhand smoke). Keep all follow-up visits as told by your health care provider. This is important. Contact a health care provider if: You have a fever. Your symptoms get worse. Your symptoms do not improve within 10 days. Get help right away if: You have a severe headache. You have persistent vomiting. You have severe pain or swelling around your face or eyes. You have vision problems. You develop confusion. Your neck is stiff. You have trouble breathing. Summary Sinusitis is soreness and inflammation of your sinuses. Sinuses are hollow spaces in the bones around your face. This condition is caused by nasal tissues that become inflamed or swollen. The swelling traps or blocks the flow of mucus. This allows bacteria, viruses, and fungi to grow, which leads to infection. If you were prescribed an antibiotic medicine, take it as told by your health care provider. Do not stop taking the antibiotic even if you start to feel better. Keep all follow-up visits as told by your health care provider. This is important. This information is not intended to replace advice given to you by your health care provider. Make sure you discuss any questions you have with your health care provider. Document Revised: 02/03/2018 Document Reviewed: 02/03/2018 Elsevier Patient Education  2022 [employer].

## 2021-10-05 NOTE — Progress Notes (Signed)
Provider: Marlowe Sax FNP-C  [name], [name], NP  Patient Care Team: Jeaninne Lodico, [name], NP as PCP - General (Family Medicine) Croitoru, Dani Gobble, MD as PCP - Cardiology (Cardiology) Sanda Klein, MD as Consulting Physician (Cardiology) Druscilla Brownie, MD as Referring Physician (Dermatology) [name], DPM as Consulting Physician (Podiatry) [name], [name], [place] (Optometry) [name], MD as Referring Physician (Gynecology)  Extended Emergency Contact Information Primary Emergency Contact: [name],[name] Phone: [phone] Mobile Phone: [phone] Relation: Son Secondary Emergency Contact: [facility] Phone: [phone] Relation: Friend  Code Status:  Full Code  Goals of care: Advanced Directive information Advanced Directives 10/05/2021  Does Patient Have a Medical Advance Directive? Yes  Type of Advance Directive Out of facility DNR (pink MOST or yellow form)  Does patient want to make changes to medical advance directive? No - Patient declined  Copy of [place] in Chart? -  Would patient like information on creating a medical advance directive? -     Chief Complaint  Patient presents with   Acute Visit    Patient complains of right nose bleeding, sore throat, and not having CT scan from previous visit.     HPI:  Pt is a 88 y.o. female seen today for an acute visit for evaluation of sore throat and hoarseness.states continues to have post nasal drainage.right maxillary and above the right eye has been more tender.Keeps spitting up " I don't want to go into the lungs and cause Pneumonia".drainage described as clear.Has had some right nose bleeding but none today.Has used Flonase and taken loratadine.she tries to avoid blowing her nose heard.she was here 09/22/2021 with similar symptoms ands was treated with a 10 day course of doxycycline but states did not relieve  her symptoms.COVID-19 test was negative. She denies any fever,chills,cough,fatigue,body aches,,chest tightness,chest pain,palpitation or shortness of breath.also denies any acid reflux   Past Medical History:  Diagnosis Date   Arthritis    right knee   Cancer ([place]) yrs ago   skin cancer removed from face   Chronic diastolic CHF (congestive heart failure) ([place])    Chronic venous insufficiency    LEA VENOUS, 10/17/2011 - mild reflux in bilateral common femoral veins   CKD (chronic kidney disease), stage III ([place])    Coronary artery disease    a. s/p PCI/BMS to prox LAD and balloon angioplasty to mLAD with suboptimal result in 2000. b. Abnl nuc 2012, cath 09/2011 - showed that the overall territory of potential ischemia was small and attributable to a moderately diseased small second diagonal artery. Med rx.   Dyspnea    with activity   Headache    History of blood transfusion 2017   Hyperlipidemia    Hypertension    Hypertensive heart disease    Hypothyroidism    Obesity    Pneumonia    several times   Stroke [facility])    pt. states she had "light stroke" in sept. 1980   Urinary incontinence    Past Surgical History:  Procedure Laterality Date   ABDOMINAL HYSTERECTOMY  1970's   complete   ANKLE ARTHROSCOPY Right 07/10/2017   Procedure: ANKLE ARTHROSCOPY;  Surgeon: [name], DPM;  Location: [facility];  Service: Podiatry;  Laterality: Right;   BACK SURGERY  07/26/2016   cervical neck surgery, [facility]   BLADDER SURGERY  2010   WITH MESH  bladder tach  bunion removal surgery Bilateral 15 yrs ago   CARDIAC CATHETERIZATION Left 09/25/2011   Medical management   CARDIAC CATHETERIZATION Left 03/25/2001   Normal LV function, LAD residual narrowing of less than 10%, normal ramus intermediate, circumflex, and RCA,    CARDIAC CATHETERIZATION  09/04/1999   LAD, 3x1mm Tetra stent resulting in a reduction of the 80% stenosis to 0% residual   CARDIAC CATHETERIZATION N/A  01/26/2015   Procedure: Right/Left Heart Cath and Coronary Angiography;  Surgeon: Troy Sine, MD;  Location: [place] [facility];  Service: Cardiovascular;  Laterality: N/A;   CARDIAC CATHETERIZATION N/A 01/27/2015   Procedure: Intravascular Pressure Wire/FFR Study;  Surgeon: Burnell Blanks, MD;  Location: [place] [facility];  Service: Cardiovascular;  Laterality: N/A;   CARDIAC CATHETERIZATION N/A 01/27/2015   Procedure: Right Heart Cath;  Surgeon: Burnell Blanks, MD;  Location: [place] [facility];  Service: Cardiovascular;  Laterality: N/A;   CHOLECYSTECTOMY     COLONOSCOPY WITH PROPOFOL N/A 03/07/2017   Procedure: COLONOSCOPY WITH PROPOFOL;  Surgeon: Juanita Craver, MD;  Location: [facility];  Service: Endoscopy;  Laterality: N/A;   CORONARY STENT PLACEMENT     LAD x 1   ganglion cyst removal  yrs ago   x 2   ILIAC VEIN ANGIOPLASTY / STENTING  02/15/2015   INTRAVASCULAR PRESSURE WIRE/FFR STUDY N/A 04/05/2017   Procedure: Intravascular Pressure Wire/FFR Study;  Surgeon: Martinique, Peter M, MD;  Location: [place] [facility];  Service: Cardiovascular;  Laterality: N/A;   IVC FILTER INSERTION  2016   IVC FILTER REMOVAL  02/15/2015   at [place] CATH AND CORONARY ANGIOGRAPHY N/A 04/05/2017   Procedure: Left Heart Cath and Coronary Angiography;  Surgeon: Martin[place], Peter M, MD;  Location: [place] [facility];  Service: Cardiovascular;  Laterality: N/A;   LEFT HEART CATHETERIZATION WITH CORONARY ANGIOGRAM N/A 09/25/2011   Procedure: LEFT HEART CATHETERIZATION WITH CORONARY ANGIOGRAM;  Surgeon: Sanda Klein, MD;  Location: [facility];  Service: Cardiovascular;  Laterality: N/A;   multiple bladder surgeries to remove mesh     ROTATOR CUFF REPAIR Right    stent to groin Left 08/2014   left leg   TENDON REPAIR Right 07/10/2017   Procedure: RIGHT PERONEAL TENDON REPAIR;  Surgeon: [name], DPM;  Location: [facility];  Service: Podiatry;  Laterality: Right;   TENDON REPAIR Right  05/27/2019   Procedure: PERONEAL TENDON REPAIR x2;  Surgeon: [name], DPM;  Location: [facility];  Service: Podiatry;  Laterality: Right;    Allergies  Allergen Reactions   Atorvastatin Anaphylaxis    Patient does not remember; caused pneumonia- took her off of it per patient.    Penicillins Anaphylaxis, Hives and Swelling    Tongue swelling Did it involve swelling of the face/tongue/throat, SOB, or low BP? Yes Did it involve sudden or severe rash/hives, skin peeling, or any reaction on the inside of your mouth or nose? Yes Did you need to seek medical attention at a hospital or doctor's office? Yes When did it last happen?  87 or 88 year old    If all above answers are NO, may proceed with cephalosporin use.    Shellfish Allergy Anaphylaxis and Nausea And Vomiting    Severe nausea and vomiting   Aminophylline Itching, Swelling and Rash    Pt experienced burning urination, itching, redness/rash, and swelling of genitals after given this medication via IV push.   Iodinated Contrast Media Swelling and Other (  See Comments)    SWELLING REACTION UNSPECIFIED  fFLUSHING   Latex Other (See Comments)    Causes blisters   Oxybutynin Chloride Other (See Comments)    "blisters"   Propoxyphene    Vancomycin Hives    06/02/2018- Hives arm, back and chest   Adhesive [Tape] Rash    Paper tape is ok   Betadine [Povidone Iodine] Rash   Ciprofloxacin Hives   Codeine Nausea And Vomiting    .   Ranolazine Other (See Comments)    Constipation  .    Outpatient Encounter Medications as of 10/05/2021  Medication Sig   amLODipine (NORVASC) 2.5 MG tablet Take 1 tablet (2.5 mg total) by mouth daily.   BOUDREAUXS BUTT PASTE 40 % ointment Apply topically 2 (two) times daily.   cholecalciferol (VITAMIN D3) 25 MCG (1000 UNIT) tablet Take 1,000 Units by mouth daily.    Docusate Sodium 100 MG capsule Take 200 mg by mouth daily at 8 pm. If no BM patient will also take 2 in the pm   ezetimibe (ZETIA)  10 MG tablet TAKE 1 TABLET(10 MG) BY MOUTH DAILY   fluticasone (FLONASE) 50 MCG/ACT nasal spray Place 2 sprays into both nostrils daily.   furosemide (LASIX) 80 MG tablet Take 1 tablet (80 mg total) by mouth daily. TAKE AN EXTRA 80mg  in the afternoon if weight is over 190   isosorbide dinitrate (ISORDIL) 30 MG tablet Take 60 mg by mouth daily at 12 noon. In the morning   ketoconazole (NIZORAL) 2 % shampoo as needed.   KLOR-CON M20 20 MEQ tablet Take 1 tablet (20 mEq total) by mouth See admin instructions. Take one tablet (20 meq) by mouth every morning and with each afternoon dose of lasix   levothyroxine (SYNTHROID) 88 MCG tablet Take 1 tablet (88 mcg total) by mouth daily.   loratadine (CLARITIN) 10 MG tablet Take 1 tablet (10 mg total) by mouth daily for 14 days.   OVER THE COUNTER MEDICATION Take 2 tablets by mouth daily. "Focus factor"   pravastatin (PRAVACHOL) 80 MG tablet Take 80 mg by mouth at bedtime.   zinc gluconate 50 MG tablet Take 1 tablet (50 mg total) by mouth daily for 14 days.   [DISCONTINUED] betamethasone acetate-betamethasone sodium phosphate (CELESTONE) injection 6 mg    No facility-administered encounter medications on file as of 10/05/2021.    Review of Systems  Constitutional:  Negative for appetite change, chills, fatigue, fever and unexpected weight change.  HENT:  Positive for congestion, hearing loss and postnasal drip. Negative for dental problem, ear discharge, ear pain, facial swelling, nosebleeds, rhinorrhea, sinus pressure, sinus pain, sneezing, sore throat, tinnitus and trouble swallowing.   Eyes:  Negative for pain, discharge, redness, itching and visual disturbance.  Respiratory:  Negative for cough, chest tightness, shortness of breath and wheezing.   Cardiovascular:  Negative for chest pain, palpitations and leg swelling.  Gastrointestinal:  Negative for abdominal distention, abdominal pain, constipation, nausea and vomiting.  Skin:  Negative for color  change, pallor and rash.  Neurological:  Negative for dizziness, syncope, speech difficulty, weakness, light-headedness, numbness and headaches.       Hoarseness   Hematological:  Does not bruise/bleed easily.   Immunization History  Administered Date(s) Administered   PFIZER(Purple Top)SARS-COV-2 Vaccination 11/11/2019, 11/13/2019, 12/08/2019   Pneumococcal Conjugate-13 06/14/2014   Pneumococcal Polysaccharide-23 10/26/2013, 03/30/2020   Tdap 12/19/2015   Pertinent  Health Maintenance Due  Topic Date Due   INFLUENZA VACCINE  12/15/2021 (Originally 04/17/2021)  DEXA SCAN  Completed   Fall Risk 06/20/2021 06/26/2021 09/05/2021 09/22/2021 10/05/2021  Falls in the past year? - 1 0 0 0  Was there an injury with Fall? - 1 0 0 0  Fall Risk Category Calculator - 2 0 0 0  Fall Risk Category - Moderate Low Low Low  Patient Fall Risk Level Low fall risk Moderate fall risk Low fall risk Low fall risk Low fall risk  Patient at Risk for Falls Due to - History of fall(s) No Fall Risks No Fall Risks No Fall Risks  Fall risk Follow up - Falls evaluation completed Falls evaluation completed Falls evaluation completed Falls evaluation completed   Functional Status Survey:    Vitals:   10/05/21 1123  BP: 130/60  Pulse: 64  Resp: 16  Temp: 97.6 F (36.4 C)  SpO2: 95%  Weight: 191 lb (86.6 kg)  Height: 5\' 5"  (1.651 m)   Body mass index is 31.78 kg/m. Physical Exam Vitals reviewed.  Constitutional:      General: She is not in acute distress.    Appearance: Normal appearance. She is normal weight. She is not ill-appearing or diaphoretic.  HENT:     Head: Normocephalic.     Right Ear: Tympanic membrane, ear canal and external ear normal. There is no impacted cerumen.     Left Ear: Tympanic membrane, ear canal and external ear normal. There is no impacted cerumen.     Nose: Congestion present. No rhinorrhea.     Right Sinus: Maxillary sinus tenderness and frontal sinus tenderness present.      Left Sinus: No maxillary sinus tenderness or frontal sinus tenderness.     Mouth/Throat:     Mouth: Mucous membranes are moist.     Pharynx: Oropharynx is clear. No oropharyngeal exudate or posterior oropharyngeal erythema.  Eyes:     General: No scleral icterus.       Right eye: No discharge.        Left eye: No discharge.     Extraocular Movements: Extraocular movements intact.     Conjunctiva/sclera: Conjunctivae normal.     Pupils: Pupils are equal, round, and reactive to light.  Neck:     Vascular: No carotid bruit.  Cardiovascular:     Rate and Rhythm: Normal rate and regular rhythm.     Pulses: Normal pulses.     Heart sounds: Normal heart sounds. No murmur heard.   No friction rub. No gallop.  Pulmonary:     Effort: Pulmonary effort is normal. No respiratory distress.     Breath sounds: Normal breath sounds. No wheezing, rhonchi or rales.  Chest:     Chest wall: No tenderness.  Abdominal:     General: Bowel sounds are normal. There is no distension.     Palpations: Abdomen is soft. There is no mass.     Tenderness: There is no abdominal tenderness. There is no right CVA tenderness, left CVA tenderness, guarding or rebound.  Musculoskeletal:     Cervical back: Normal range of motion. No rigidity or tenderness.  Lymphadenopathy:     Cervical: No cervical adenopathy.  Skin:    General: Skin is warm and dry.     Coloration: Skin is not pale.     Findings: No bruising, erythema or rash.  Neurological:     Mental Status: She is alert and oriented to person, place, and time.     Motor: No weakness.     Gait: Gait normal.  Psychiatric:  Mood and Affect: Mood normal.        Speech: Speech normal.        Behavior: Behavior normal.    Labs reviewed: Recent Labs    02/27/21 0000 04/14/21 1541 09/05/21 1008  NA 145 143 141  K 4.0 4.6 4.1  CL 103 102 103  CO2 34* 34* 32  GLUCOSE  --  89 101  BUN 20 17 15   CREATININE 0.9 0.83 0.71  CALCIUM 9.0 10.1 9.4    Recent Labs    02/27/21 0000 04/14/21 1541  AST 40* 41  ALT 38* 24  ALKPHOS 89 90  BILITOT  --  1.0  PROT  --  6.8  ALBUMIN 3.8 3.6   Recent Labs    04/14/21 1541 06/26/21 0942 09/05/21 1008  WBC 6.1 4.0 5.5  NEUTROABS 3.9 2,480 3,119  HGB 15.0 14.8 14.6  HCT 45.1 44.7 43.9  MCV 99.8 98.5 99.5  PLT 133* 115* 118*   Lab Results  Component Value Date   TSH 0.28 (L) 09/05/2021   Lab Results  Component Value Date   HGBA1C 6.1 (H) 01/26/2015   Lab Results  Component Value Date   CHOL 136 09/05/2021   HDL 69 09/05/2021   LDLCALC 51 09/05/2021   TRIG 78 09/05/2021   CHOLHDL 2.0 09/05/2021    Significant Diagnostic Results in last 30 days:  No results found.  Assessment/Plan 1. Acute recurrent maxillary sinusitis Status post 10 days course of doxycycline without any relief. More tender over right frontal and right maxillary. - continue on flonase nasal spray  - start on Z-pak has used in the passed. - Ambulatory referral to ENT - azithromycin (ZITHROMAX) 250 MG tablet; Take 2 tablets (500 mg ) by mouth x 1 dose then one tablet ( 250 mg ) by mouth daily x 4 days  Dispense: 6 tablet; Refill: 0  2. Hoarseness Possible due to PND but seems to be worsening.Denies any acid reflux.will refer to ENT patient agrees to referral.  - Ambulatory referral to ENT - azithromycin (ZITHROMAX) 250 MG tablet; Take 2 tablets (500 mg ) by mouth x 1 dose then one tablet ( 250 mg ) by mouth daily x 4 days  Dispense: 6 tablet; Refill: 0 - advised to notified provider if symptoms worsen or fail to improve  Family/ staff Communication: Reviewed plan of care with patient verbalized understanding   Labs/tests ordered: None   Next Appointment: As needed if symptoms worsen or fail to improve    Sandrea Hughs, NP

## 2021-10-11 DIAGNOSIS — M19071 Primary osteoarthritis, right ankle and foot: Secondary | ICD-10-CM | POA: Diagnosis not present

## 2021-10-16 ENCOUNTER — Telehealth: Payer: Self-pay | Admitting: Cardiovascular Disease

## 2021-10-16 ENCOUNTER — Encounter: Payer: Self-pay | Admitting: Family

## 2021-10-16 ENCOUNTER — Other Ambulatory Visit: Payer: Self-pay

## 2021-10-16 ENCOUNTER — Ambulatory Visit (INDEPENDENT_AMBULATORY_CARE_PROVIDER_SITE_OTHER): Payer: HMO | Admitting: Family

## 2021-10-16 VITALS — BP 118/60 | HR 77 | Temp 97.1°F | Ht 65.0 in | Wt 196.6 lb

## 2021-10-16 DIAGNOSIS — R5383 Other fatigue: Secondary | ICD-10-CM

## 2021-10-16 DIAGNOSIS — I5032 Chronic diastolic (congestive) heart failure: Secondary | ICD-10-CM | POA: Diagnosis not present

## 2021-10-16 DIAGNOSIS — R1030 Lower abdominal pain, unspecified: Secondary | ICD-10-CM | POA: Diagnosis not present

## 2021-10-16 DIAGNOSIS — F800 Phonological disorder: Secondary | ICD-10-CM | POA: Diagnosis not present

## 2021-10-16 LAB — POCT URINALYSIS DIPSTICK
Bilirubin, UA: NEGATIVE
Glucose, UA: NEGATIVE
Ketones, UA: NEGATIVE
Nitrite, UA: NEGATIVE
Protein, UA: NEGATIVE
Spec Grav, UA: 1.02 (ref 1.010–1.025)
Urobilinogen, UA: 0.2 E.U./dL
pH, UA: 6 (ref 5.0–8.0)

## 2021-10-16 NOTE — Telephone Encounter (Signed)
Returned call to patient of Dr. Sallyanne Kuster who is concerned that she had mini-stroke over the weekend   Patient states she was sitting in chart, was so tired, dozed off. Woke up, didn't feel right. Her husband noticed she was breathing thru her mouth. She reports she was then unable to get words out normally, like it was a "blurb" --worried her that she could not say "breathe". She reports other episodes where she had trouble getting words out. She has noticed similar issues since fall in October 2022  She has weakness on right side but this is longstanding - curvature of spine, tendon issues in RLE She did NOT endorse any acute stroke-like symptoms apart from one noted episode of her words not coming out right   She was already scheduled for a visit by operator on 2/2 with MD  Advised she also contact her PCP

## 2021-10-16 NOTE — Progress Notes (Signed)
Provider: Marlowe Sax FNP-C  Jill Preston, [name], NP  Patient Care Team: Letti Towell, [name], NP as PCP - General (Family Medicine) Croitoru, Dani Gobble, MD as PCP - Cardiology (Cardiology) Croitoru, Dani Gobble, MD as Consulting Physician (Cardiology) Druscilla Brownie, MD as Referring Physician (Dermatology) [name], DPM as Consulting Physician (Podiatry) [name], [name], [place] (Optometry) [name], MD as Referring Physician (Gynecology)  Extended Emergency Contact Information Primary Emergency Contact: Celli,Lewis "Meribeth Mattes [place] Phone: [phone] Mobile Phone: [phone] Relation: Son Secondary Emergency Contact: [facility] Phone: [phone] Relation: Friend  Code Status:  DNR Goals of care: Advanced Directive information Advanced Directives 10/05/2021  Does Patient Have a Medical Advance Directive? Yes  Type of Advance Directive Out of facility DNR (pink MOST or yellow form)  Does patient want to make changes to medical advance directive? No - Patient declined  Copy of [place] in Chart? -  Would patient like information on creating a medical advance directive? -     Chief Complaint  Patient presents with   Acute Visit    Patient complains of shortness of breath and fatigue. Patient had some confusion on Saturday. Patient had trouble getting words out last week. Patient has a lot pressure on right side of head. Patient very tired. Patient is not very active.     HPI:  Pt is a 88 y.o. female seen today for an acute visit for evaluation of shortness of breath and fatigue.states had trouble getting her words out yesterday.Has some pressure on right side of the head.states could not do any of her work just sat on her recliner.Not eating well due to issues with her ongoing trouble swallowing.swallows pills with apple sauce.thinks this ongoing since October 4 th,2022 when she sustained a  fall.she is concerned about stroke since her mother died of stroke.Had CT scan of the head ,cervical spine and maxillofacial which did not show any stroke.decline repeat of CT scan states has appointment at the end of this week with ENT thinks CT scan might be reo-ordered.   Had lower abdominal pain this morning.denies any fever,chills,nausea,vomiting,,flank pain,urgency,frequency,dysuria,difficult urination or hematuria.  She denies any issues with her speech today.    Past Medical History:  Diagnosis Date   Arthritis    right knee   Cancer ([place]) yrs ago   skin cancer removed from face   Chronic diastolic CHF (congestive heart failure) (HCC)    Chronic venous insufficiency    LEA VENOUS, 10/17/2011 - mild reflux in bilateral common femoral veins   CKD (chronic kidney disease), stage III ([place])    Coronary artery disease    a. s/p PCI/BMS to prox LAD and balloon angioplasty to mLAD with suboptimal result in 2000. b. Abnl nuc 2012, cath 09/2011 - showed that the overall territory of potential ischemia was small and attributable to a moderately diseased small second diagonal artery. Med rx.   Dyspnea    with activity   Headache    History of blood transfusion 2017   Hyperlipidemia    Hypertension    Hypertensive heart disease    Hypothyroidism    Obesity    Pneumonia    several times   Stroke [facility])    pt. states she had "light stroke" in sept. 1980   Urinary incontinence    Past Surgical History:  Procedure Laterality Date   ABDOMINAL HYSTERECTOMY  1970's   complete   ANKLE  ARTHROSCOPY Right 07/10/2017   Procedure: ANKLE ARTHROSCOPY;  Surgeon: [name], DPM;  Location: [place] [place];  Service: Podiatry;  Laterality: Right;   BACK SURGERY  07/26/2016   cervical neck surgery, [facility]   BLADDER SURGERY  2010   WITH MESH  bladder tach   bunion removal surgery Bilateral 15 yrs ago   CARDIAC CATHETERIZATION Left 09/25/2011   Medical management   CARDIAC  CATHETERIZATION Left 03/25/2001   Normal LV function, LAD residual narrowing of less than 10%, normal ramus intermediate, circumflex, and RCA,    CARDIAC CATHETERIZATION  09/04/1999   LAD, 3x71m Tetra stent resulting in a reduction of the 80% stenosis to 0% residual   CARDIAC CATHETERIZATION N/A 01/26/2015   Procedure: Right/Left Heart Cath and Coronary Angiography;  Surgeon: TTroy Sine MD;  [facility];  Service: Cardiovascular;  Laterality: N/A;   CARDIAC CATHETERIZATION N/A 01/27/2015   Procedure: Intravascular Pressure Wire/FFR Study;  Surgeon: CBurnell Blanks MD;  [facility];  Service: Cardiovascular;  Laterality: N/A;   CARDIAC CATHETERIZATION N/A 01/27/2015   Procedure: Right Heart Cath;  Surgeon: CBurnell Blanks MD;  [facility];  Service: Cardiovascular;  Laterality: N/A;   CHOLECYSTECTOMY     COLONOSCOPY WITH PROPOFOL N/A 03/07/2017   Procedure: COLONOSCOPY WITH PROPOFOL;  Surgeon: MJuanita Craver MD;  [facility];  Service: Endoscopy;  Laterality: N/A;   CORONARY STENT PLACEMENT     LAD x 1   ganglion cyst removal  yrs ago   x 2   ILIAC VEIN ANGIOPLASTY / STENTING  02/15/2015   INTRAVASCULAR PRESSURE WIRE/FFR STUDY N/A 04/05/2017   Procedure: Intravascular Pressure Wire/FFR Study;  Surgeon: JMartinique Peter M, MD;  [facility];  Service: Cardiovascular;  Laterality: N/A;   IVC FILTER INSERTION  2016   IVC FILTER REMOVAL  02/15/2015   [facility] N/A 04/05/2017   Procedure: Left Heart Cath and Coronary Angiography;  Surgeon: JMartinique Peter M, MD;  [facility];  Service: Cardiovascular;  Laterality: N/A;   LEFT HEART CATHETERIZATION WITH CORONARY ANGIOGRAM N/A 09/25/2011   Procedure: LEFT HEART CATHETERIZATION WITH CORONARY ANGIOGRAM;  Surgeon: MSanda Klein MD;  [facility];  Service: Cardiovascular;  Laterality: N/A;   multiple  bladder surgeries to remove mesh     ROTATOR CUFF REPAIR Right    stent to groin Left 08/2014   left leg   TENDON REPAIR Right 07/10/2017   Procedure: RIGHT PERONEAL TENDON REPAIR;  Surgeon: PEvelina Bucy DPM;  [facility]  Service: Podiatry;  Laterality: Right;   TENDON REPAIR Right 05/27/2019   Procedure: PERONEAL TENDON REPAIR x2;  Surgeon: PEvelina Bucy DPM;  [facility];  Service: Podiatry;  Laterality: Right;    Allergies  Allergen Reactions   Atorvastatin Anaphylaxis    Patient does not remember; caused pneumonia- took her off of it per patient.    Penicillins Anaphylaxis, Hives and Swelling    Tongue swelling Did it involve swelling of the face/tongue/throat, SOB, or low BP? Yes Did it involve sudden or severe rash/hives, skin peeling, or any reaction on the inside of your mouth or nose? Yes Did you need to seek medical attention at a hospital or doctor's office? Yes When did it last happen?  89or 88year old    If all above answers are NO, may proceed with cephalosporin  use.    Shellfish Allergy Anaphylaxis and Nausea And Vomiting    Severe nausea and vomiting   Aminophylline Itching, Swelling and Rash    Pt experienced burning urination, itching, redness/rash, and swelling of genitals after given this medication via IV push.   Iodinated Contrast Media Swelling and Other (See Comments)    SWELLING REACTION UNSPECIFIED  fFLUSHING   Latex Other (See Comments)    Causes blisters   Oxybutynin Chloride Other (See Comments)    "blisters"   Propoxyphene    Vancomycin Hives    06/02/2018- Hives arm, back and chest   Adhesive [Tape] Rash    Paper tape is ok   Betadine [Povidone Iodine] Rash   Ciprofloxacin Hives   Codeine Nausea And Vomiting    .   Ranolazine Other (See Comments)    Constipation  .    Outpatient Encounter Medications as of 10/16/2021  Medication Sig   amLODipine (NORVASC) 2.5 MG tablet Take 1 tablet (2.5 mg total) by mouth daily.    azithromycin (ZITHROMAX) 250 MG tablet Take 2 tablets (500 mg ) by mouth x 1 dose then one tablet ( 250 mg ) by mouth daily x 4 days   BOUDREAUXS BUTT PASTE 40 % ointment Apply topically 2 (two) times daily.   cholecalciferol (VITAMIN D3) 25 MCG (1000 UNIT) tablet Take 1,000 Units by mouth daily.    Docusate Sodium 100 MG capsule Take 200 mg by mouth daily at 8 pm. If no BM patient will also take 2 in the pm   ezetimibe (ZETIA) 10 MG tablet TAKE 1 TABLET(10 MG) BY MOUTH DAILY   fluticasone (FLONASE) 50 MCG/ACT nasal spray Place 2 sprays into both nostrils daily.   furosemide (LASIX) 80 MG tablet Take 1 tablet (80 mg total) by mouth daily. TAKE AN EXTRA 28m in the afternoon if weight is over 190   isosorbide dinitrate (ISORDIL) 30 MG tablet Take 60 mg by mouth daily at 12 noon. In the morning   ketoconazole (NIZORAL) 2 % shampoo as needed.   KLOR-CON M20 20 MEQ tablet Take 1 tablet (20 mEq total) by mouth See admin instructions. Take one tablet (20 meq) by mouth every morning and with each afternoon dose of lasix   levothyroxine (SYNTHROID) 88 MCG tablet Take 1 tablet (88 mcg total) by mouth daily.   OVER THE COUNTER MEDICATION Take 2 tablets by mouth daily. "Focus factor"   pravastatin (PRAVACHOL) 80 MG tablet Take 80 mg by mouth at bedtime.   loratadine (CLARITIN) 10 MG tablet Take 1 tablet (10 mg total) by mouth daily for 14 days.   No facility-administered encounter medications on file as of 10/16/2021.    Review of Systems  Constitutional:  Negative for appetite change, chills, fatigue, fever and unexpected weight change.  HENT:  Negative for congestion, dental problem, ear discharge, ear pain, facial swelling, hearing loss, nosebleeds, postnasal drip, rhinorrhea, sinus pressure, sinus pain, sneezing, sore throat, tinnitus and trouble swallowing.   Eyes:  Negative for pain, discharge, redness, itching and visual disturbance.  Respiratory:  Negative for cough, chest tightness, shortness of  breath and wheezing.   Cardiovascular:  Negative for chest pain, palpitations and leg swelling.  Gastrointestinal:  Negative for abdominal distention, abdominal pain, blood in stool, constipation, diarrhea, nausea and vomiting.  Endocrine: Negative for cold intolerance, heat intolerance, polydipsia, polyphagia and polyuria.  Genitourinary:  Negative for difficulty urinating, dysuria, flank pain, frequency and urgency.  Musculoskeletal:  Negative for arthralgias, back pain, gait problem,  joint swelling, myalgias, neck pain and neck stiffness.  Skin:  Negative for color change, pallor, rash and wound.  Neurological:  Negative for dizziness, syncope, weakness, light-headedness, numbness and headaches.       Had trouble articulating her words on Sunday   Hematological:  Does not bruise/bleed easily.  Psychiatric/Behavioral:  Negative for agitation, behavioral problems, confusion, hallucinations, self-injury, sleep disturbance and suicidal ideas. The patient is not nervous/anxious.    Immunization History  Administered Date(s) Administered   PFIZER(Purple Top)SARS-COV-2 Vaccination 11/11/2019, 11/13/2019, 12/08/2019   Pneumococcal Conjugate-13 06/14/2014   Pneumococcal Polysaccharide-23 10/26/2013, 03/30/2020   Tdap 12/19/2015   Pertinent  Health Maintenance Due  Topic Date Due   INFLUENZA VACCINE  12/15/2021 (Originally 04/17/2021)   DEXA SCAN  Completed   Fall Risk 06/20/2021 06/26/2021 09/05/2021 09/22/2021 10/05/2021  Falls in the past year? - 1 0 0 0  Was there an injury with Fall? - 1 0 0 0  Fall Risk Category Calculator - 2 0 0 0  Fall Risk Category - Moderate Low Low Low  Patient Fall Risk Level Low fall risk Moderate fall risk Low fall risk Low fall risk Low fall risk  Patient at Risk for Falls Due to - History of fall(s) No Fall Risks No Fall Risks No Fall Risks  Fall risk Follow up - Falls evaluation completed Falls evaluation completed Falls evaluation completed Falls evaluation  completed   Functional Status Survey:    Vitals:   10/16/21 1442  BP: 118/60  Pulse: 77  Temp: (!) 97.1 F (36.2 C)  SpO2: 97%  Weight: 196 lb 9.6 oz (89.2 kg)  Height: _0  (1.651 m)   Body mass index is 32.72 kg/m. Physical Exam Vitals reviewed.  Constitutional:      General: She is not in acute distress.    Appearance: Normal appearance. She is normal weight. She is not ill-appearing or diaphoretic.  HENT:     Head: Normocephalic.     Right Ear: Tympanic membrane, ear canal and external ear normal. There is no impacted cerumen.     Left Ear: Tympanic membrane, ear canal and external ear normal. There is no impacted cerumen.     Nose: Nose normal. No congestion or rhinorrhea.     Mouth/Throat:     Mouth: Mucous membranes are moist.     Pharynx: Oropharynx is clear. No oropharyngeal exudate or posterior oropharyngeal erythema.  Eyes:     General: No scleral icterus.       Right eye: No discharge.        Left eye: No discharge.     Extraocular Movements: Extraocular movements intact.     Conjunctiva/sclera: Conjunctivae normal.     Pupils: Pupils are equal, round, and reactive to light.  Neck:     Vascular: No carotid bruit.  Cardiovascular:     Rate and Rhythm: Normal rate and regular rhythm.     Pulses: Normal pulses.     Heart sounds: Normal heart sounds. No murmur heard.   No friction rub. No gallop.  Pulmonary:     Effort: Pulmonary effort is normal. No respiratory distress.     Breath sounds: Normal breath sounds. No wheezing, rhonchi or rales.  Chest:     Chest wall: No tenderness.  Abdominal:     General: Bowel sounds are normal. There is no distension.     Palpations: Abdomen is soft. There is no mass.     Tenderness: There is no abdominal tenderness. There is no  right CVA tenderness, left CVA tenderness, guarding or rebound.  Musculoskeletal:        General: No swelling or tenderness. Normal range of motion.     Cervical back: Normal range of motion.  No rigidity or tenderness.     Right lower leg: No edema.     Left lower leg: No edema.  Lymphadenopathy:     Cervical: No cervical adenopathy.  Skin:    General: Skin is warm and dry.     Coloration: Skin is not pale.     Findings: No bruising, erythema, lesion or rash.  Neurological:     Mental Status: She is alert and oriented to person, place, and time.     Motor: No weakness.     Gait: Gait abnormal.  Psychiatric:        Mood and Affect: Mood normal.        Speech: Speech normal.        Behavior: Behavior normal.        Thought Content: Thought content normal.        Judgment: Judgment normal.    Labs reviewed: Recent Labs    02/27/21 0000 04/14/21 1541 09/05/21 1008  NA 145 143 141  K 4.0 4.6 4.1  CL 103 102 103  CO2 34* 34* 32  GLUCOSE  --  89 101  BUN _0 CREATININE 0.9 0.83 0.71  CALCIUM 9.0 10.1 9.4   Recent Labs    02/27/21 0000 04/14/21 1541  AST 40* 41  ALT 38* 24  ALKPHOS 89 90  BILITOT  --  1.0  PROT  --  6.8  ALBUMIN 3.8 3.6   Recent Labs    04/14/21 1541 06/26/21 0942 09/05/21 1008  WBC 6.1 4.0 5.5  NEUTROABS 3.9 2,480 3,119  HGB 15.0 14.8 14.6  HCT 45.1 44.7 43.9  MCV 99.8 98.5 99.5  PLT 133* 115* 118*   Lab Results  Component Value Date   TSH 0.28 (L) 09/05/2021   Lab Results  Component Value Date   HGBA1C 6.1 (H) 01/26/2015   Lab Results  Component Value Date   CHOL 136 09/05/2021   HDL 69 09/05/2021   LDLCALC 51 09/05/2021   TRIG 78 09/05/2021   CHOLHDL 2.0 09/05/2021    Significant Diagnostic Results in last 30 days:  No results found.  Assessment/Plan 1. Impaired speech articulation Reports difficulty articulating words yesterday but none today.decline order for CT scan or ED for evaluation.Speech clear today.Neuro exam intact.  2. Lower abdominal pain Report discomfort across suprapubic and lower quadrant.tender to palpation - Urine Culture - POC Urinalysis Dipstick indicates indicates yellow clear  urine positive for trace blood and leukocytes but negative for nitrites   3. Chronic diastolic CHF (congestive heart failure) (HCC) Has 5 lbs weight gain since last visit.has additional furosemide 80 mg tablet to take if weight > 190 lbs  - No signs edema or shortness of breath noted during visit  - continue to monitor weight and take diuretic as prescribed.  - CBC with Differential/Platelet - BMP with eGFR([facility])  4. Fatigue, unspecified type Afebrile  Will rule out other acute and metabolic etiologies  - CBC with Differential/Platelet - BMP with eGFR([facility])  Family/ staff Communication: Reviewed plan of care with patient verbalized understanding.  Labs/tests ordered:  - CBC with Differential/Platelet - BMP with eGFR([facility]) - Urine Culture - POC Urinalysis Dipstick  Next Appointment: As needed if symptoms worsen or fail to improve    Avigdor Dollar  Shelva Majestic, NP

## 2021-10-16 NOTE — Telephone Encounter (Signed)
New Message:    Patient said over the weekend, she had episodes that caused her some concerns. First she said she was extremely tired, could not hardly function. Another episode , she could not hardly get her words out, was not sure if she might had a mini stroke. Patient wanted to be seen. I made her an appointment for Thursday(10-19-21). Please call to evaluate.

## 2021-10-17 LAB — CBC WITH DIFFERENTIAL/PLATELET
Absolute Monocytes: 664 cells/uL (ref 200–950)
Basophils Absolute: 80 cells/uL (ref 0–200)
Basophils Relative: 1 %
Eosinophils Absolute: 544 cells/uL — ABNORMAL HIGH (ref 15–500)
Eosinophils Relative: 6.8 %
HCT: 45.2 % — ABNORMAL HIGH (ref 35.0–45.0)
Hemoglobin: 15.4 g/dL (ref 11.7–15.5)
Lymphs Abs: 1912 cells/uL (ref 850–3900)
MCH: 33.5 pg — ABNORMAL HIGH (ref 27.0–33.0)
MCHC: 34.1 g/dL (ref 32.0–36.0)
MCV: 98.3 fL (ref 80.0–100.0)
MPV: 11.1 fL (ref 7.5–12.5)
Monocytes Relative: 8.3 %
Neutro Abs: 4800 cells/uL (ref 1500–7800)
Neutrophils Relative %: 60 %
Platelets: 144 10*3/uL (ref 140–400)
RBC: 4.6 10*6/uL (ref 3.80–5.10)
RDW: 12.1 % (ref 11.0–15.0)
Total Lymphocyte: 23.9 %
WBC: 8 10*3/uL (ref 3.8–10.8)

## 2021-10-17 LAB — BASIC METABOLIC PANEL WITH GFR
BUN: 19 mg/dL (ref 7–25)
CO2: 37 mmol/L — ABNORMAL HIGH (ref 20–32)
Calcium: 8.9 mg/dL (ref 8.6–10.4)
Chloride: 101 mmol/L (ref 98–110)
Creat: 0.75 mg/dL (ref 0.60–0.95)
Glucose, Bld: 66 mg/dL (ref 65–99)
Potassium: 3.6 mmol/L (ref 3.5–5.3)
Sodium: 143 mmol/L (ref 135–146)
eGFR: 77 mL/min/{1.73_m2} (ref >=60)

## 2021-10-17 LAB — URINE CULTURE
MICRO NUMBER:: 12938014
Result:: NO GROWTH
SPECIMEN QUALITY:: ADEQUATE

## 2021-10-18 ENCOUNTER — Other Ambulatory Visit: Payer: Self-pay | Admitting: Family

## 2021-10-18 DIAGNOSIS — R058 Other specified cough: Secondary | ICD-10-CM

## 2021-10-19 ENCOUNTER — Ambulatory Visit: Payer: HMO | Admitting: Cardiovascular Disease

## 2021-10-19 ENCOUNTER — Encounter: Payer: Self-pay | Admitting: Cardiovascular Disease

## 2021-10-19 ENCOUNTER — Other Ambulatory Visit: Payer: Self-pay

## 2021-10-19 VITALS — BP 124/66 | HR 68 | Ht 65.5 in | Wt 196.6 lb

## 2021-10-19 DIAGNOSIS — R059 Cough, unspecified: Secondary | ICD-10-CM | POA: Diagnosis not present

## 2021-10-19 DIAGNOSIS — E669 Obesity, unspecified: Secondary | ICD-10-CM | POA: Diagnosis not present

## 2021-10-19 DIAGNOSIS — I739 Peripheral vascular disease, unspecified: Secondary | ICD-10-CM

## 2021-10-19 DIAGNOSIS — I10 Essential (primary) hypertension: Secondary | ICD-10-CM | POA: Diagnosis not present

## 2021-10-19 DIAGNOSIS — H9193 Unspecified hearing loss, bilateral: Secondary | ICD-10-CM | POA: Diagnosis not present

## 2021-10-19 DIAGNOSIS — R1310 Dysphagia, unspecified: Secondary | ICD-10-CM | POA: Diagnosis not present

## 2021-10-19 DIAGNOSIS — I5032 Chronic diastolic (congestive) heart failure: Secondary | ICD-10-CM

## 2021-10-19 DIAGNOSIS — E782 Mixed hyperlipidemia: Secondary | ICD-10-CM | POA: Diagnosis not present

## 2021-10-19 DIAGNOSIS — I25118 Atherosclerotic heart disease of native coronary artery with other forms of angina pectoris: Secondary | ICD-10-CM

## 2021-10-19 NOTE — Patient Instructions (Signed)
Medication Instructions:  No changes *If you need a refill on your cardiac medications before your next appointment, please call your pharmacy*   Lab Work: None ordered If you have labs (blood work) drawn today and your tests are completely normal, you will receive your results only by: . MyChart Message (if you have MyChart) OR . A paper copy in the mail If you have any lab test that is abnormal or we need to change your treatment, we will call you to review the results.   Testing/Procedures: None ordered   Follow-Up: At [facility], you and your health needs are our priority.  As part of our continuing mission to provide you with exceptional heart care, we have created designated Provider Care Teams.  These Care Teams include your primary Cardiologist (physician) and Advanced Practice Providers (APPs -  Physician Assistants and Nurse Practitioners) who all work together to provide you with the care you need, when you need it.  We recommend signing up for the patient portal called "MyChart".  Sign up information is provided on this After Visit Summary.  MyChart is used to connect with patients for Virtual Visits (Telemedicine).  Patients are able to view lab/test results, encounter notes, upcoming appointments, etc.  Non-urgent messages can be sent to your provider as well.   To learn more about what you can do with MyChart, go to [email or web address].    Your next appointment:   3 month(s)  The format for your next appointment:   In Person  Provider:   Mihai Croitoru, MD   

## 2021-10-19 NOTE — Progress Notes (Signed)
Patient ID: Jill Preston, female   DOB: 1933/07/29, 88 y.o.   MRN: [id number]    Cardiology Office Note    Date:  10/20/2021   ID:  Jill Preston, DOB 1932/10/25, MRN [id number]  PCP:  Jill Hughs, NP  Cardiologist:   Sanda Klein, MD   Chief Complaint  Patient presents with   Congestive Heart Failure     History of Present Illness:  Jill Preston is a 88 y.o. female returning in follow-up for CAD and CHF.  Has a moderate stenosis of the left main coronary artery, not  hemodynamically significant by FFR analysis in 2016 and again in 2018.  She has chronic diastolic heart failure that responds well to diuretics, but excessive diuresis has occasionally led to acute kidney injury.  She has numerous complaints today, but none of them appear to be related to her cardiac illness.  She had a fall several weeks ago and hit the right side of her head.  She no longer has any bruising and she did not have any intracranial hemorrhage at the time, by CT scan.  However, she complains of sinus congestion and muffled hearing in her right ear, ever since.  She has had a cough for a couple of weeks, initially she had some bloody secretions but now is just coughing up thick mucus.  She complains of problems with both her ankles and wears braces bilaterally.  Several days ago she noticed that she had weak grasp in her right hand and she dropped something in the kitchen.  On another occasion when she was trying to answer her husband's question she was unable to say the word "breath" despite 3 consecutive attempts.  She has had difficulty concentrating.  She gets emotional describing these events.  She also told me that she is very upset since both her sister and her brother had brain cancer and one of them the cancer recurred after a fall with head injury.  She is scheduled to see ENT later today.  She has not had chest pain or worsening shortness of breath with household activities recently.  As  usual has NYHA functional class II-3 status.  Her edema is well controlled and she has not had any weeping from the skin of her legs (she often has weeping from the old scar in her left anterior shin when she is volume overloaded).  She denies orthopnea or PND.  She always sleeps with the head of the bed elevated at about 30 degrees..  She has not had dizziness or syncope.  She denies palpitations.  On her home scale today she weighed 194 pounds (2-1/2 pounds more on our office scale at 196.6).  Her husband accompanied her to the office today.  He continues to have an unpredictable sleep pattern and [name] does not get much rest.  In October she had a fall.  She made a sudden change of the head and developed severe vertigo.  She did not have syncope.  She did hit her head and have a right periorbital hematoma.  Imaging did not show any evidence of intracranial bleeding.   She has a long history of coronary disease. She underwent proximal LAD bare-metal stenting and balloon angioplasty of the mid LAD in 2013. She has had persistent ischemia in the territory of the diagonal artery on nuclear stress test performed over the last few years. Coronary angiography performed May 2016 showed a widely patent LAD stent, 60% ostial circumflex stenosis, 20-30% right  coronary artery stenosis. There was concern about possible left coronary artery stenosis but fractional flow reserve was normal (0.96 at baseline, 0.91 during intravenous adenosine infusion). She had good anginal response to Ranexa but developed severe constipation and could not tolerate the medication. She has been intolerant to beta blockers due to bradycardia. She has preserved left ventricular systolic function but has had episodes of acute exacerbation of diastolic heart failure attributed to hypertensive heart disease. December 2015, she was critically ill with an acute left iliofemoral DVT with anticoagulation complicated by retroperitoneal  hematoma and hemorrhagic shock, requiring placement of an inferior vena cava filter. The filter was removed in June 2016. Coronary angiography in July 2018 showed unchanged anatomy of the eccentric stenosis in the left main coronary artery with noncritical FFR (0.89 in the LAD, 0.86 in the LCx).  Intravascular Pressure Wire/FFR Study  Left Heart Cath and Coronary Angiography  Conclusion     LM lesion, 70 %stenosed. Mid LAD lesion, 0 %stenosed at site of prior stent. Ost Cx lesion, 60 %stenosed. Prox RCA-1 lesion, 20 %stenosed. Prox RCA-2 lesion, 30 %stenosed. The left ventricular systolic function is normal. LV end diastolic pressure is normal. The left ventricular ejection fraction is 55-65% by visual estimate.   1. 70% eccentric distal left main stenosis. Angiographically similar to 2016. FFR 0.89 into the LAD and 0.86 into the LCx suggesting that this lesion is not flow limiting.  2. Otherwise nonobstructive CAD. Patent stent in LAD. 3. Normal LV function 4. Normal LVEDP   Plan: recommend continued medical therapy.     Past Medical History:  Diagnosis Date   Arthritis    right knee   Cancer ([place]) yrs ago   skin cancer removed from face   Chronic diastolic CHF (congestive heart failure) (HCC)    Chronic venous insufficiency    LEA VENOUS, 10/17/2011 - mild reflux in bilateral common femoral veins   CKD (chronic kidney disease), stage III ([place])    Coronary artery disease    a. s/p PCI/BMS to prox LAD and balloon angioplasty to mLAD with suboptimal result in 2000. b. Abnl nuc 2012, cath 09/2011 - showed that the overall territory of potential ischemia was small and attributable to a moderately diseased small second diagonal artery. Med rx.   Dyspnea    with activity   Headache    History of blood transfusion 2017   Hyperlipidemia    Hypertension    Hypertensive heart disease    Hypothyroidism    Obesity    Pneumonia    several times   Stroke [facility])    pt. states she had  "light stroke" in sept. 1980   Urinary incontinence     Past Surgical History:  Procedure Laterality Date   ABDOMINAL HYSTERECTOMY  1970's   complete   ANKLE ARTHROSCOPY Right 07/10/2017   Procedure: ANKLE ARTHROSCOPY;  Surgeon: [name], DPM;  Location: [place];  Service: Podiatry;  Laterality: Right;   BACK SURGERY  07/26/2016   cervical neck surgery, [facility]   BLADDER SURGERY  2010   WITH MESH  bladder tach   bunion removal surgery Bilateral 15 yrs ago   CARDIAC CATHETERIZATION Left 09/25/2011   Medical management   CARDIAC CATHETERIZATION Left 03/25/2001   Normal LV function, LAD residual narrowing of less than 10%, normal ramus intermediate, circumflex, and RCA,    CARDIAC CATHETERIZATION  09/04/1999   LAD, 3x28mm Tetra stent resulting in a reduction of the 80% stenosis to 0% residual  CARDIAC CATHETERIZATION N/A 01/26/2015   Procedure: Right/Left Heart Cath and Coronary Angiography;  Surgeon: Troy Sine, MD;  Location: [place] CV [facility];  Service: Cardiovascular;  Laterality: N/A;   CARDIAC CATHETERIZATION N/A 01/27/2015   Procedure: Intravascular Pressure Wire/FFR Study;  Surgeon: Burnell Blanks, MD;  Location: [place] [facility];  Service: Cardiovascular;  Laterality: N/A;   CARDIAC CATHETERIZATION N/A 01/27/2015   Procedure: Right Heart Cath;  Surgeon: Burnell Blanks, MD;  Location: [place] [facility];  Service: Cardiovascular;  Laterality: N/A;   CHOLECYSTECTOMY     COLONOSCOPY WITH PROPOFOL N/A 03/07/2017   Procedure: COLONOSCOPY WITH PROPOFOL;  Surgeon: Juanita Craver, MD;  Location: [facility];  Service: Endoscopy;  Laterality: N/A;   CORONARY STENT PLACEMENT     LAD x 1   ganglion cyst removal  yrs ago   x 2   ILIAC VEIN ANGIOPLASTY / STENTING  02/15/2015   INTRAVASCULAR PRESSURE WIRE/FFR STUDY N/A 04/05/2017   Procedure: Intravascular Pressure Wire/FFR Study;  Surgeon: Martinique, Peter M, MD;  Location: [place] [facility];  Service:  Cardiovascular;  Laterality: N/A;   IVC FILTER INSERTION  2016   IVC FILTER REMOVAL  02/15/2015   at [place] CATH AND CORONARY ANGIOGRAPHY N/A 04/05/2017   Procedure: Left Heart Cath and Coronary Angiography;  Surgeon: Martinique, Peter M, MD;  Location: [place] [facility];  Service: Cardiovascular;  Laterality: N/A;   LEFT HEART CATHETERIZATION WITH CORONARY ANGIOGRAM N/A 09/25/2011   Procedure: LEFT HEART CATHETERIZATION WITH CORONARY ANGIOGRAM;  Surgeon: Sanda Klein, MD;  Location: [place] CATH LAB;  Service: Cardiovascular;  Laterality: N/A;   multiple bladder surgeries to remove mesh     ROTATOR CUFF REPAIR Right    stent to groin Left 08/2014   left leg   TENDON REPAIR Right 07/10/2017   Procedure: RIGHT PERONEAL TENDON REPAIR;  Surgeon: [name], DPM;  Location: [place];  Service: Podiatry;  Laterality: Right;   TENDON REPAIR Right 05/27/2019   Procedure: PERONEAL TENDON REPAIR x2;  Surgeon: [name], DPM;  Location: [facility];  Service: Podiatry;  Laterality: Right;    Outpatient Medications Prior to Visit  Medication Sig Dispense Refill   amLODipine (NORVASC) 2.5 MG tablet Take 1 tablet (2.5 mg total) by mouth daily. 90 tablet 3   BOUDREAUXS BUTT PASTE 40 % ointment Apply topically 2 (two) times daily.     cholecalciferol (VITAMIN D3) 25 MCG (1000 UNIT) tablet Take 1,000 Units by mouth daily.      Docusate Sodium 100 MG capsule Take 200 mg by mouth daily at 8 pm. If no BM patient will also take 2 in the pm     ezetimibe (ZETIA) 10 MG tablet TAKE 1 TABLET(10 MG) BY MOUTH DAILY 90 tablet 3   fluticasone (FLONASE) 50 MCG/ACT nasal spray Place 2 sprays into both nostrils daily. 16 g 6   furosemide (LASIX) 80 MG tablet Take 1 tablet (80 mg total) by mouth daily. TAKE AN EXTRA 80mg  in the afternoon if weight is over 190 135 tablet 3   isosorbide dinitrate (ISORDIL) 30 MG tablet Take 60 mg by mouth daily at 12 noon. In the morning     ketoconazole (NIZORAL) 2 % shampoo as  needed.     KLOR-CON M20 20 MEQ tablet Take 1 tablet (20 mEq total) by mouth See admin instructions. Take one tablet (20 meq) by mouth every morning and with each afternoon dose of lasix 180 tablet  3   levothyroxine (SYNTHROID) 88 MCG tablet Take 1 tablet (88 mcg total) by mouth daily. 90 tablet 1   Multiple Vitamins-Minerals (ZINC PO) Take by mouth.     OVER THE COUNTER MEDICATION Take 2 tablets by mouth daily. "Focus factor"     pravastatin (PRAVACHOL) 80 MG tablet Take 80 mg by mouth at bedtime.     loratadine (CLARITIN) 10 MG tablet Take 1 tablet (10 mg total) by mouth daily for 14 days. 14 tablet 0   Potassium Chloride ER 20 MEQ TBCR tablet     azithromycin (ZITHROMAX) 250 MG tablet Take 2 tablets (500 mg ) by mouth x 1 dose then one tablet ( 250 mg ) by mouth daily x 4 days (Patient not taking: Reported on 10/19/2021) 6 tablet 0   No facility-administered medications prior to visit.     Allergies:   Atorvastatin, Penicillins, Shellfish allergy, Aminophylline, Iodinated contrast media, Latex, Oxybutynin chloride, Propoxyphene, Vancomycin, Adhesive [tape], Betadine [povidone iodine], Ciprofloxacin, Codeine, and Ranolazine   Social History   Socioeconomic History   Marital status: Married    Spouse name: Not on file   Number of children: Not on file   Years of education: Not on file   Highest education level: Not on file  Occupational History   Not on file  Tobacco Use   Smoking status: Never   Smokeless tobacco: Never  Vaping Use   Vaping Use: Never used  Substance and Sexual Activity   Alcohol use: No   Drug use: No   Sexual activity: Not on file  Other Topics Concern   Not on file  Social History Narrative   Per [facility] New Patient Packet Abstracted on 03/06/2021      Diet: Left blank       Caffeine: No      Married, if yes what year: Yes, 1955      Do you live in a house, apartment, assisted living, condo, trailer, ect: House      Is it one or more stories: 1 story       How many persons live in your home? 2      Pets: No      Highest level or education completed: 12 th grade      Current/Past profession: Futures trader       Exercise:          Yes       Type and how often: Walking          Living Will: Yes   DNR: No   POA/HPOA: Yes      Functional Status:   Do you have difficulty bathing or dressing yourself? Left Blank    Do you have difficulty preparing food or eating?Left Blank   Do you have difficulty managing your medications?Left Blank   Do you have difficulty managing your finances?Left Blank   Do you have difficulty affording your medications?Left Blank   Social Determinants of Health   Financial Resource Strain: Not on file  Food Insecurity: Not on file  Transportation Needs: Not on file  Physical Activity: Not on file  Stress: Not on file  Social Connections: Not on file     Family History:  The patient's family history includes Brain cancer in her brother; Cancer in her mother and sister; Diabetes in her brother; Heart attack in her brother; Heart disease in her brother, brother, brother, father, sister, and sister; High blood pressure in her brother, brother, and sister.  ROS:   Please see the history of present illness.    ROS All other systems are reviewed and are negative.   PHYSICAL EXAM:   VS:  BP 124/66 (BP Location: Right Arm)    Pulse 68    Ht 5' 5.5" (1.664 m)    Wt 196 lb 9.6 oz (89.2 kg)    SpO2 95%    BMI 32.22 kg/m       General: Alert, oriented x3, no distress, obese Head: no evidence of trauma, PERRL, EOMI, no exophtalmos or lid lag, no myxedema, no xanthelasma; normal ears, nose and oropharynx Neck: normal jugular venous pulsations and no hepatojugular reflux; brisk carotid pulses without delay and no carotid bruits Chest: clear to auscultation, no signs of consolidation by percussion or palpation, normal fremitus, symmetrical and full respiratory excursions Cardiovascular: normal position and  quality of the apical impulse, regular rhythm, normal first and second heart sounds, no murmurs, rubs or gallops Abdomen: no tenderness or distention, no masses by palpation, no abnormal pulsatility or arterial bruits, normal bowel sounds, no hepatosplenomegaly Extremities: no clubbing, cyanosis , 1+ symmetrical ankle edema; 2+ radial, ulnar and brachial pulses bilaterally; 2+ right femoral, posterior tibial and dorsalis pedis pulses; 2+ left femoral, posterior tibial and dorsalis pedis pulses; no subclavian or femoral bruits Neurological: grossly nonfocal Psych: Normal mood and affect   Wt Readings from Last 3 Encounters:  10/19/21 196 lb 9.6 oz (89.2 kg)  10/16/21 196 lb 9.6 oz (89.2 kg)  10/05/21 191 lb (86.6 kg)      Studies/Labs Reviewed:   EKG:  EKG is ordered today and personally reviewed.  It shows normal sinus rhythm and is a normal tracing. Recent Labs: 04/14/2021: ALT 24 09/05/2021: TSH 0.28 10/16/2021: BUN 19; Creat 0.75; Hemoglobin 15.4; Platelets 144; Potassium 3.6; Sodium 143   Lipid Panel    Component Value Date/Time   CHOL 136 09/05/2021 1008   CHOL 193 11/18/2017 0857   TRIG 78 09/05/2021 1008   HDL 69 09/05/2021 1008   HDL 47 11/18/2017 0857   CHOLHDL 2.0 09/05/2021 1008   VLDL 48 (H) 06/27/2015 1336   LDLCALC 51 09/05/2021 1008      Labs 03/23/2020 Total cholesterol 145, HDL 55, LDL 69, triglycerides 118 TSH 0.886  09/21/2020 Total cholesterol 122, HDL 56, LDL 47, triglycerides 101   CATH 04/05/2017  LM lesion, 70 %stenosed. Mid LAD lesion, 0 %stenosed at site of prior stent. Ost Cx lesion, 60 %stenosed. Prox RCA-1 lesion, 20 %stenosed. Prox RCA-2 lesion, 30 %stenosed. The left ventricular systolic function is normal. LV end diastolic pressure is normal. The left ventricular ejection fraction is 55-65% by visual estimate.   1. 70% eccentric distal left main stenosis. Angiographically similar to 2016. FFR 0.89 into the LAD and 0.86 into the LCx  suggesting that this lesion is not flow limiting.  2. Otherwise nonobstructive CAD. Patent stent in LAD. 3. Normal LV function 4. Normal LVEDP   Plan: recommend continued medical therapy.    ASSESSMENT:    1. Coronary artery disease of native artery of native heart with stable angina pectoris ([place])   2. Chronic diastolic heart failure ([place])   3. Essential hypertension   4. PAD (peripheral artery disease) ([place])   5. Mixed hyperlipidemia   6. Mild obesity      PLAN:  In order of problems listed above:  CHF: She appears to be a little bit above our previously estimated "dry weight" of 190 pounds on her home scale, by  only about 3 pounds.  She does not have orthopnea or significant change in her mild ankle edema..  Previous attempts at aggressive diuresis using metolazone has led to acute kidney injury on a couple of occasions.  Reminded her of the importance of sodium dietary restriction, but I do not think we should change her prescription at this time.  Call if her weight exceeds 195 pounds. CAD: Does not have current problems with angina pectoris..  She has a known moderate stenosis in the left coronary artery that was not significant by FFR performed as recently as 2018.  Nuclear stress testing would not be useful to evaluate this due to the likelihood of balanced ischemia.  If necessary, would follow this up with coronary CT angiography.  However I do not think she would be a candidate for bypass surgery anymore, with advancing age and worsening functional status.  She does not tolerate beta-blockers due to bradycardia and ranolazine caused severe constipation. HTN: Blood pressure should always be checked in the right arm since she has some degree of left subclavian stenosis.  Well-controlled today. PAD: Known left subclavian stenosis, without subclavian steal syndrome or claudication. Hyperlipidemia: Both HDL and LDL cholesterol levels are excellent on labs performed a few weeks ago.   LDL 51, well within our target less than 70. Obesity: She might have gained back a bit of real weight.  Does not appear to be hypervolemic, even though she weighs about 8 pounds more than she did 6 months ago. Neurological complaints: Not sure that she is truly describing a TIA, when she had her difficulty finding words or when she dropped an object in the kitchen.  She continues to be quite emotional and close to being overwhelmed by the difficulties of her home situation.  She seems to believe that head trauma can be a cause for developing a brain tumor no neurological abnormalities are detected on exam today.  We should probably reevaluate her carotid arteries, since she has no subclavian artery stenosis and has not had an evaluation since 2015.   Medication Adjustments/Labs and Tests Ordered: Current medicines are reviewed at length with the patient today.  Concerns regarding medicines are outlined above.  Medication changes, Labs and Tests ordered today are listed in the Patient Instructions below. Patient Instructions  Medication Instructions:  No changes *If you need a refill on your cardiac medications before your next appointment, please call your pharmacy*   Lab Work: None ordered If you have labs (blood work) drawn today and your tests are completely normal, you will receive your results only by: [place] (if you have MyChart) OR A paper copy in the mail If you have any lab test that is abnormal or we need to change your treatment, we will call you to review the results.   Testing/Procedures: None ordered   Follow-Up: At [facility], you and your health needs are our priority.  As part of our continuing mission to provide you with exceptional heart care, we have created designated Provider Care Teams.  These Care Teams include your primary Cardiologist (physician) and Advanced Practice Providers (APPs -  Physician Assistants and Nurse Practitioners) who all work  together to provide you with the care you need, when you need it.  We recommend signing up for the patient portal called "MyChart".  Sign up information is provided on this After Visit Summary.  MyChart is used to connect with patients for Virtual Visits (Telemedicine).  Patients are able to view lab/test results,  encounter notes, upcoming appointments, etc.  Non-urgent messages can be sent to your provider as well.   To learn more about what you can do with MyChart, go to [email or web address].    Your next appointment:   3 month(s)  The format for your next appointment:   In Person  Provider:   Sanda Klein, MD {       Signed, Sanda Klein, MD  10/20/2021 6:46 PM    [address], [place], [place]  [address] Phone: [phone]; Fax: [phone]

## 2021-10-23 ENCOUNTER — Other Ambulatory Visit: Payer: Self-pay | Admitting: Physician Assistant

## 2021-10-23 ENCOUNTER — Other Ambulatory Visit: Payer: Self-pay | Admitting: *Deleted

## 2021-10-23 DIAGNOSIS — R1310 Dysphagia, unspecified: Secondary | ICD-10-CM

## 2021-10-23 DIAGNOSIS — H903 Sensorineural hearing loss, bilateral: Secondary | ICD-10-CM | POA: Diagnosis not present

## 2021-10-23 DIAGNOSIS — I739 Peripheral vascular disease, unspecified: Secondary | ICD-10-CM

## 2021-10-25 DIAGNOSIS — Z515 Encounter for palliative care: Secondary | ICD-10-CM | POA: Diagnosis not present

## 2021-10-25 DIAGNOSIS — N1831 Chronic kidney disease, stage 3a: Secondary | ICD-10-CM | POA: Diagnosis not present

## 2021-10-25 DIAGNOSIS — D692 Other nonthrombocytopenic purpura: Secondary | ICD-10-CM | POA: Diagnosis not present

## 2021-10-25 DIAGNOSIS — K7460 Unspecified cirrhosis of liver: Secondary | ICD-10-CM | POA: Diagnosis not present

## 2021-10-25 DIAGNOSIS — I4891 Unspecified atrial fibrillation: Secondary | ICD-10-CM | POA: Diagnosis not present

## 2021-10-26 ENCOUNTER — Other Ambulatory Visit: Payer: Self-pay | Admitting: Cardiovascular Disease

## 2021-10-30 ENCOUNTER — Other Ambulatory Visit: Payer: HMO

## 2021-11-02 ENCOUNTER — Other Ambulatory Visit: Payer: Self-pay

## 2021-11-02 ENCOUNTER — Other Ambulatory Visit: Payer: HMO

## 2021-11-02 DIAGNOSIS — E039 Hypothyroidism, unspecified: Secondary | ICD-10-CM | POA: Diagnosis not present

## 2021-11-02 DIAGNOSIS — K219 Gastro-esophageal reflux disease without esophagitis: Secondary | ICD-10-CM | POA: Diagnosis not present

## 2021-11-02 DIAGNOSIS — R1310 Dysphagia, unspecified: Secondary | ICD-10-CM | POA: Diagnosis not present

## 2021-11-02 DIAGNOSIS — J300 Vasomotor rhinitis: Secondary | ICD-10-CM | POA: Diagnosis not present

## 2021-11-02 DIAGNOSIS — R053 Chronic cough: Secondary | ICD-10-CM | POA: Diagnosis not present

## 2021-11-03 ENCOUNTER — Ambulatory Visit
Admission: RE | Admit: 2021-11-03 | Discharge: 2021-11-03 | Disposition: A | Discharge status: Home / Self Care | Payer: HMO | Source: Ambulatory Visit | Attending: Physician Assistant | Admitting: Physician Assistant

## 2021-11-03 ENCOUNTER — Other Ambulatory Visit: Payer: Self-pay | Admitting: Physician Assistant

## 2021-11-03 ENCOUNTER — Ambulatory Visit
Admission: RE | Admit: 2021-11-03 | Discharge: 2021-11-03 | Disposition: A | Discharge status: Home / Self Care | Payer: HMO | Source: Ambulatory Visit | Attending: Cardiovascular Disease | Admitting: Cardiovascular Disease

## 2021-11-03 DIAGNOSIS — K224 Dyskinesia of esophagus: Secondary | ICD-10-CM | POA: Diagnosis not present

## 2021-11-03 DIAGNOSIS — R1310 Dysphagia, unspecified: Secondary | ICD-10-CM

## 2021-11-03 DIAGNOSIS — Z8679 Personal history of other diseases of the circulatory system: Secondary | ICD-10-CM | POA: Diagnosis not present

## 2021-11-03 DIAGNOSIS — I739 Peripheral vascular disease, unspecified: Secondary | ICD-10-CM

## 2021-11-03 LAB — TSH: TSH: 3.06 mIU/L (ref 0.40–4.50)

## 2021-11-06 ENCOUNTER — Encounter: Payer: Self-pay | Admitting: *Deleted

## 2021-11-09 DIAGNOSIS — Z872 Personal history of diseases of the skin and subcutaneous tissue: Secondary | ICD-10-CM | POA: Diagnosis not present

## 2021-11-09 DIAGNOSIS — Z85828 Personal history of other malignant neoplasm of skin: Secondary | ICD-10-CM | POA: Diagnosis not present

## 2021-11-09 DIAGNOSIS — L814 Other melanin hyperpigmentation: Secondary | ICD-10-CM | POA: Diagnosis not present

## 2021-11-09 DIAGNOSIS — Z08 Encounter for follow-up examination after completed treatment for malignant neoplasm: Secondary | ICD-10-CM | POA: Diagnosis not present

## 2021-11-09 DIAGNOSIS — L570 Actinic keratosis: Secondary | ICD-10-CM | POA: Diagnosis not present

## 2021-11-09 DIAGNOSIS — L304 Erythema intertrigo: Secondary | ICD-10-CM | POA: Diagnosis not present

## 2021-11-09 DIAGNOSIS — H61032 Chondritis of left external ear: Secondary | ICD-10-CM | POA: Diagnosis not present

## 2021-11-09 DIAGNOSIS — L821 Other seborrheic keratosis: Secondary | ICD-10-CM | POA: Diagnosis not present

## 2021-11-09 DIAGNOSIS — D225 Melanocytic nevi of trunk: Secondary | ICD-10-CM | POA: Diagnosis not present

## 2021-11-16 ENCOUNTER — Ambulatory Visit: Payer: HMO | Admitting: Pulmonary Disease

## 2021-11-16 ENCOUNTER — Ambulatory Visit (INDEPENDENT_AMBULATORY_CARE_PROVIDER_SITE_OTHER): Payer: HMO

## 2021-11-16 ENCOUNTER — Other Ambulatory Visit: Payer: Self-pay

## 2021-11-16 ENCOUNTER — Encounter: Payer: Self-pay | Admitting: Pulmonary Disease

## 2021-11-16 VITALS — BP 138/72 | HR 78 | Ht 65.5 in | Wt 199.0 lb

## 2021-11-16 DIAGNOSIS — R053 Chronic cough: Secondary | ICD-10-CM

## 2021-11-16 DIAGNOSIS — K224 Dyskinesia of esophagus: Secondary | ICD-10-CM

## 2021-11-16 MED ORDER — OMEPRAZOLE 40 MG PO CPDR: 40.0000 mg | DELAYED_RELEASE_CAPSULE | Freq: Two times a day (BID) | ORAL | 3 refills | Status: DC

## 2021-11-16 NOTE — Progress Notes (Signed)
Synopsis: Referred in March 2023 for cough by Marlowe Sax, NP  Subjective:   PATIENT ID: Jill Preston GENDER: female DOB: 09/16/1933, MRN: [id number]  HPI  Chief Complaint  Patient presents with   Consult    Referred by PCP for chronic cough for the past 6 months.    Chaylee Besancon is an 88 year old woman, never smoker with hypertension, CKD III, chronic diastolic heart failure, stroke and hypothyroidism who is referred to pulmonary clinic for cough.   Patient reports developing cough over the last 6 months since she had issues with a fall.  She had a evaluation by otolaryngology at [facility] who ordered a modified barium swallow which showed concerns for moderate esophageal dysmotility.  She denies any overt heartburn or reflux symptoms.  She sleeps with the head of her bed elevated 4 to 6 inches.  She reports the cough is worse at night when she lays down and will occasionally have nighttime awakenings due to cough. Per the ENT note patient was to start omeprazole 40mg  twice daily but patient is unsure if she took this treatment. She was prescribed azithromycin 09/2021 for the cough without improvement and also treated with course of prednisone without improvement.   She denies wheezing or shortnes sof breath. She is a never smoker.  Past Medical History:  Diagnosis Date   Arthritis    right knee   Cancer ([place]) yrs ago   skin cancer removed from face   Chronic diastolic CHF (congestive heart failure) (HCC)    Chronic venous insufficiency    LEA VENOUS, 10/17/2011 - mild reflux in bilateral common femoral veins   CKD (chronic kidney disease), stage III ([place])    Coronary artery disease    a. s/p PCI/BMS to prox LAD and balloon angioplasty to mLAD with suboptimal result in 2000. b. Abnl nuc 2012, cath 09/2011 - showed that the overall territory of potential ischemia was small and attributable to a moderately diseased small second diagonal artery. Med rx.   Dyspnea    with  activity   Headache    History of blood transfusion 2017   Hyperlipidemia    Hypertension    Hypertensive heart disease    Hypothyroidism    Obesity    Pneumonia    several times   Stroke [facility])    pt. states she had "light stroke" in sept. 1980   Urinary incontinence      Family History  Problem Relation Age of Onset   Cancer Mother    Heart disease Father    Heart disease Sister    High blood pressure Sister    Heart disease Sister    Cancer Sister    Heart disease Brother    Diabetes Brother    Brain cancer Brother    High blood pressure Brother    Heart disease Brother    Heart attack Brother    Heart disease Brother    High blood pressure Brother      Social History   Socioeconomic History   Marital status: Married    Spouse name: Not on file   Number of children: Not on file   Years of education: Not on file   Highest education level: Not on file  Occupational History   Not on file  Tobacco Use   Smoking status: Never   Smokeless tobacco: Never  Vaping Use   Vaping Use: Never used  Substance and Sexual Activity   Alcohol use: No  Drug use: No   Sexual activity: Not on file  Other Topics Concern   Not on file  Social History Narrative   Per [place] Patient Packet Abstracted on 03/06/2021      Diet: Left blank       Caffeine: No      Married, if yes what year: Yes, 1955      Do you live in a house, apartment, assisted living, condo, trailer, ect: House      Is it one or more stories: 1 story      How many persons live in your home? 2      Pets: No      Highest level or education completed: 12 th grade      Current/Past profession: Futures trader       Exercise:          Yes       Type and how often: Walking          Living Will: Yes   DNR: No   POA/HPOA: Yes      Functional Status:   Do you have difficulty bathing or dressing yourself? Left Blank    Do you have difficulty preparing food or eating?Left Blank   Do you have  difficulty managing your medications?Left Blank   Do you have difficulty managing your finances?Left Blank   Do you have difficulty affording your medications?Left Blank   Social Determinants of Health   Financial Resource Strain: Not on file  Food Insecurity: Not on file  Transportation Needs: Not on file  Physical Activity: Not on file  Stress: Not on file  Social Connections: Not on file  Intimate Partner Violence: Not on file     Allergies  Allergen Reactions   Atorvastatin Anaphylaxis    Patient does not remember; caused pneumonia- took her off of it per patient.    Penicillins Anaphylaxis, Hives and Swelling    Tongue swelling Did it involve swelling of the face/tongue/throat, SOB, or low BP? Yes Did it involve sudden or severe rash/hives, skin peeling, or any reaction on the inside of your mouth or nose? Yes Did you need to seek medical attention at a hospital or doctor's office? Yes When did it last happen?  61 or 88 year old    If all above answers are "NO", may proceed with cephalosporin use.    Shellfish Allergy Anaphylaxis and Nausea And Vomiting    Severe nausea and vomiting   Aminophylline Itching, Swelling and Rash    Pt experienced burning urination, itching, redness/rash, and swelling of genitals after given this medication via IV push.   Iodinated Contrast Media Swelling and Other (See Comments)    SWELLING REACTION UNSPECIFIED  fFLUSHING   Latex Other (See Comments)    Causes blisters   Oxybutynin Chloride Other (See Comments)    "blisters"   Propoxyphene    Vancomycin Hives    06/02/2018- Hives arm, back and chest   Adhesive [Tape] Rash    Paper tape is ok   Betadine [Povidone Iodine] Rash   Ciprofloxacin Hives   Codeine Nausea And Vomiting    .   Ranolazine Other (See Comments)    Constipation  .     Outpatient Medications Prior to Visit  Medication Sig Dispense Refill   amLODipine (NORVASC) 2.5 MG tablet Take 1 tablet (2.5 mg total) by mouth  daily. 90 tablet 3   BOUDREAUXS BUTT PASTE 40 % ointment Apply topically 2 (two) times  daily.     cholecalciferol (VITAMIN D3) 25 MCG (1000 UNIT) tablet Take 1,000 Units by mouth daily.      Docusate Sodium 100 MG capsule Take 200 mg by mouth daily at 8 pm. If no BM patient will also take 2 in the pm     ezetimibe (ZETIA) 10 MG tablet TAKE 1 TABLET(10 MG) BY MOUTH DAILY 90 tablet 3   fluticasone (FLONASE) 50 MCG/ACT nasal spray Place 2 sprays into both nostrils daily. 16 g 6   furosemide (LASIX) 80 MG tablet Take 1 tablet (80 mg total) by mouth daily. TAKE AN EXTRA 80mg  in the afternoon if weight is over 190 135 tablet 3   isosorbide dinitrate (ISORDIL) 30 MG tablet Take 60 mg by mouth daily at 12 noon. In the morning     ketoconazole (NIZORAL) 2 % shampoo as needed.     KLOR-CON M20 20 MEQ tablet Take 1 tablet (20 mEq total) by mouth See admin instructions. Take one tablet (20 meq) by mouth every morning and with each afternoon dose of lasix 180 tablet 3   levothyroxine (SYNTHROID) 88 MCG tablet Take 1 tablet (88 mcg total) by mouth daily. 90 tablet 1   Multiple Vitamins-Minerals (ZINC PO) Take by mouth.     OVER THE COUNTER MEDICATION Take 2 tablets by mouth daily. "Focus factor"     Potassium Chloride ER 20 MEQ TBCR tablet     pravastatin (PRAVACHOL) 80 MG tablet Take 80 mg by mouth at bedtime.     loratadine (CLARITIN) 10 MG tablet Take 1 tablet (10 mg total) by mouth daily for 14 days. 14 tablet 0   No facility-administered medications prior to visit.   Review of Systems  Constitutional:  Negative for chills, fever, malaise/fatigue and weight loss.  HENT:  Positive for congestion. Negative for sinus pain and sore throat.   Eyes: Negative.   Respiratory:  Positive for cough, sputum production and shortness of breath. Negative for hemoptysis and wheezing.   Cardiovascular:  Negative for chest pain, palpitations, orthopnea, claudication and leg swelling.  Gastrointestinal:  Negative for  abdominal pain, heartburn, nausea and vomiting.       Dysphagia  Genitourinary: Negative.   Musculoskeletal:  Positive for joint pain. Negative for myalgias.  Skin:  Negative for rash.  Neurological:  Positive for headaches. Negative for weakness.  Endo/Heme/Allergies: Negative.   Psychiatric/Behavioral: Negative.     Objective:   Vitals:   11/16/21 1527  BP: 138/72  Pulse: 78  SpO2: 96%  Weight: 199 lb (90.3 kg)  Height: 5' 5.5" (1.664 m)   Physical Exam Constitutional:      General: She is not in acute distress.    Appearance: She is not ill-appearing.  HENT:     Head: Normocephalic and atraumatic.  Eyes:     General: No scleral icterus.    Conjunctiva/sclera: Conjunctivae normal.     Pupils: Pupils are equal, round, and reactive to light.  Cardiovascular:     Rate and Rhythm: Normal rate and regular rhythm.     Pulses: Normal pulses.     Heart sounds: Normal heart sounds. No murmur heard. Pulmonary:     Effort: Pulmonary effort is normal.     Breath sounds: Normal breath sounds. No wheezing, rhonchi or rales.  Abdominal:     General: Bowel sounds are normal.     Palpations: Abdomen is soft.  Musculoskeletal:     Right lower leg: No edema.     Left lower  leg: No edema.  Lymphadenopathy:     Cervical: No cervical adenopathy.  Skin:    General: Skin is warm and dry.  Neurological:     General: No focal deficit present.     Mental Status: She is alert.  Psychiatric:        Mood and Affect: Mood normal.        Behavior: Behavior normal.        Thought Content: Thought content normal.        Judgment: Judgment normal.    CBC    Component Value Date/Time   WBC 8.0 10/16/2021 1539   RBC 4.60 10/16/2021 1539   HGB 15.4 10/16/2021 1539   HGB 15.0 04/14/2021 1541   HGB 14.8 04/03/2021 1233   HCT 45.2 (H) 10/16/2021 1539   HCT 46.6 04/03/2021 1233   PLT 144 10/16/2021 1539   PLT 133 (L) 04/14/2021 1541   PLT 128 (L) 04/03/2021 1233   MCV 98.3 10/16/2021  1539   MCV 100 (H) 04/03/2021 1233   MCH 33.5 (H) 10/16/2021 1539   MCHC 34.1 10/16/2021 1539   RDW 12.1 10/16/2021 1539   RDW 12.5 04/03/2021 1233   LYMPHSABS 1,912 10/16/2021 1539   LYMPHSABS 1.7 12/12/2017 1321   MONOABS 0.4 04/14/2021 1541   EOSABS 544 (H) 10/16/2021 1539   EOSABS 0.3 12/12/2017 1321   BASOSABS 80 10/16/2021 1539   BASOSABS 0.1 12/12/2017 1321   Chest imaging:  PFT: PFT Results Latest Ref Rng & Units 02/09/2015  FVC-Pre L 2.67  FVC-Predicted Pre % 101  FVC-Post L 2.72  FVC-Predicted Post % 103  Pre FEV1/FVC % % 83  Post FEV1/FCV % % 85  FEV1-Pre L 2.21  FEV1-Predicted Pre % 112  FEV1-Post L 2.30  DLCO uncorrected ml/min/mmHg 19.32  DLCO UNC% % 79  DLCO corrected ml/min/mmHg 19.32  DLCO COR %Predicted % 79  DLVA Predicted % 103  TLC L 4.85  TLC % Predicted % 96  RV % Predicted % 90  2015: Normal PFTs  Labs:  Path:  Echo:  Heart Catheterization:  Assessment & Plan:   Chronic cough - Plan: omeprazole (PRILOSEC) 40 MG capsule, DG Chest 2 View  Esophageal dysmotility - Plan: Ambulatory referral to Gastroenterology  Discussion: Jill Preston is an 88 year old woman, never smoker with hypertension, CKD III, chronic diastolic heart failure, stroke and hypothyroidism who is referred to pulmonary clinic for cough.   Patient's cough for the last 6 months is likely secondary to her esophageal dysmotility disorder with reflux as noted on her recent modified barium swallow.  We have placed referral to [facility] gastroenterology per the patient's request.  We will order her omeprazole 40 mg twice daily for possible reflux disease.  She does not have history concerning for reactive airways disease or asthma.  Chest radiograph today is unremarkable. She is being treated for sinus drainage by her ear nose and throat specialist with ipratropium nasal spray.  Follow up as needed.  Freda Jackson, MD [facility] Office:  [phone]   Current Outpatient Medications:    amLODipine (NORVASC) 2.5 MG tablet, Take 1 tablet (2.5 mg total) by mouth daily., Disp: 90 tablet, Rfl: 3   BOUDREAUXS BUTT PASTE 40 % ointment, Apply topically 2 (two) times daily., Disp: , Rfl:    cholecalciferol (VITAMIN D3) 25 MCG (1000 UNIT) tablet, Take 1,000 Units by mouth daily. , Disp: , Rfl:    Docusate Sodium 100 MG capsule, Take 200 mg by  mouth daily at 8 pm. If no BM patient will also take 2 in the pm, Disp: , Rfl:    ezetimibe (ZETIA) 10 MG tablet, TAKE 1 TABLET(10 MG) BY MOUTH DAILY, Disp: 90 tablet, Rfl: 3   fluticasone (FLONASE) 50 MCG/ACT nasal spray, Place 2 sprays into both nostrils daily., Disp: 16 g, Rfl: 6   furosemide (LASIX) 80 MG tablet, Take 1 tablet (80 mg total) by mouth daily. TAKE AN EXTRA 80mg  in the afternoon if weight is over 190, Disp: 135 tablet, Rfl: 3   isosorbide dinitrate (ISORDIL) 30 MG tablet, Take 60 mg by mouth daily at 12 noon. In the morning, Disp: , Rfl:    ketoconazole (NIZORAL) 2 % shampoo, as needed., Disp: , Rfl:    KLOR-CON M20 20 MEQ tablet, Take 1 tablet (20 mEq total) by mouth See admin instructions. Take one tablet (20 meq) by mouth every morning and with each afternoon dose of lasix, Disp: 180 tablet, Rfl: 3   levothyroxine (SYNTHROID) 88 MCG tablet, Take 1 tablet (88 mcg total) by mouth daily., Disp: 90 tablet, Rfl: 1   Multiple Vitamins-Minerals (ZINC PO), Take by mouth., Disp: , Rfl:    omeprazole (PRILOSEC) 40 MG capsule, Take 1 capsule (40 mg total) by mouth 2 (two) times daily., Disp: 60 capsule, Rfl: 3   OVER THE COUNTER MEDICATION, Take 2 tablets by mouth daily. "Focus factor", Disp: , Rfl:    Potassium Chloride ER 20 MEQ TBCR, tablet, Disp: , Rfl:    pravastatin (PRAVACHOL) 80 MG tablet, Take 80 mg by mouth at bedtime., Disp: , Rfl:    loratadine (CLARITIN) 10 MG tablet, Take 1 tablet (10 mg total) by mouth daily for 14 days., Disp: 14 tablet, Rfl: 0

## 2021-11-16 NOTE — Patient Instructions (Addendum)
Recommend calling Dr. Juanita Craver, MD of gastroenterology for follow up. [phone] ? ?If Dr. Collene Mares is not able to further evaluate your esophagus I would then refer you to [place] for further evaluation. ? ?Your esophageal study shows you have abnormal movement of your esophagus.  ? ?Take omeprazole 40mg  twice daily. ?- take 30 minutes before eating breakfast.  ? ?We will check a chest x-ray today ? ?Follow up as needed.  ? ? ? ? ?

## 2021-11-21 DIAGNOSIS — R933 Abnormal findings on diagnostic imaging of other parts of digestive tract: Secondary | ICD-10-CM | POA: Diagnosis not present

## 2021-11-21 DIAGNOSIS — K7460 Unspecified cirrhosis of liver: Secondary | ICD-10-CM | POA: Diagnosis not present

## 2021-11-21 DIAGNOSIS — R1310 Dysphagia, unspecified: Secondary | ICD-10-CM | POA: Diagnosis not present

## 2021-11-21 DIAGNOSIS — Z86010 Personal history of colonic polyps: Secondary | ICD-10-CM | POA: Diagnosis not present

## 2021-11-21 DIAGNOSIS — K5730 Diverticulosis of large intestine without perforation or abscess without bleeding: Secondary | ICD-10-CM | POA: Diagnosis not present

## 2021-11-21 DIAGNOSIS — E669 Obesity, unspecified: Secondary | ICD-10-CM | POA: Diagnosis not present

## 2021-11-24 ENCOUNTER — Other Ambulatory Visit: Payer: Self-pay | Admitting: Gastroenterology

## 2021-12-01 ENCOUNTER — Other Ambulatory Visit: Payer: Self-pay

## 2021-12-01 ENCOUNTER — Ambulatory Visit (HOSPITAL_COMMUNITY): Payer: HMO | Admitting: Certified Registered Nurse Anesthetist

## 2021-12-01 ENCOUNTER — Encounter (HOSPITAL_COMMUNITY)
Admission: RE | Disposition: A | Discharge status: Home / Self Care | Payer: Self-pay | Source: Ambulatory Visit | Attending: Gastroenterology

## 2021-12-01 ENCOUNTER — Encounter (HOSPITAL_COMMUNITY): Payer: Self-pay | Admitting: Gastroenterology

## 2021-12-01 ENCOUNTER — Ambulatory Visit (HOSPITAL_BASED_OUTPATIENT_CLINIC_OR_DEPARTMENT_OTHER): Payer: HMO | Admitting: Certified Registered Nurse Anesthetist

## 2021-12-01 ENCOUNTER — Ambulatory Visit (HOSPITAL_COMMUNITY)
Admission: RE | Admit: 2021-12-01 | Discharge: 2021-12-01 | Disposition: A | Discharge status: Home / Self Care | Payer: HMO | Source: Ambulatory Visit | Attending: Gastroenterology | Admitting: Gastroenterology

## 2021-12-01 DIAGNOSIS — Z955 Presence of coronary angioplasty implant and graft: Secondary | ICD-10-CM | POA: Diagnosis not present

## 2021-12-01 DIAGNOSIS — E039 Hypothyroidism, unspecified: Secondary | ICD-10-CM | POA: Diagnosis not present

## 2021-12-01 DIAGNOSIS — E785 Hyperlipidemia, unspecified: Secondary | ICD-10-CM | POA: Insufficient documentation

## 2021-12-01 DIAGNOSIS — K3189 Other diseases of stomach and duodenum: Secondary | ICD-10-CM

## 2021-12-01 DIAGNOSIS — E669 Obesity, unspecified: Secondary | ICD-10-CM | POA: Insufficient documentation

## 2021-12-01 DIAGNOSIS — I8500 Esophageal varices without bleeding: Secondary | ICD-10-CM | POA: Diagnosis not present

## 2021-12-01 DIAGNOSIS — I2510 Atherosclerotic heart disease of native coronary artery without angina pectoris: Secondary | ICD-10-CM | POA: Diagnosis not present

## 2021-12-01 DIAGNOSIS — I25119 Atherosclerotic heart disease of native coronary artery with unspecified angina pectoris: Secondary | ICD-10-CM

## 2021-12-01 DIAGNOSIS — R1310 Dysphagia, unspecified: Secondary | ICD-10-CM | POA: Insufficient documentation

## 2021-12-01 DIAGNOSIS — I5032 Chronic diastolic (congestive) heart failure: Secondary | ICD-10-CM | POA: Diagnosis not present

## 2021-12-01 DIAGNOSIS — K766 Portal hypertension: Secondary | ICD-10-CM | POA: Insufficient documentation

## 2021-12-01 DIAGNOSIS — I10 Essential (primary) hypertension: Secondary | ICD-10-CM | POA: Diagnosis not present

## 2021-12-01 DIAGNOSIS — Z6832 Body mass index (BMI) 32.0-32.9, adult: Secondary | ICD-10-CM | POA: Diagnosis not present

## 2021-12-01 DIAGNOSIS — I8510 Secondary esophageal varices without bleeding: Secondary | ICD-10-CM | POA: Insufficient documentation

## 2021-12-01 DIAGNOSIS — N183 Chronic kidney disease, stage 3 unspecified: Secondary | ICD-10-CM | POA: Diagnosis not present

## 2021-12-01 DIAGNOSIS — I130 Hypertensive heart and chronic kidney disease with heart failure and stage 1 through stage 4 chronic kidney disease, or unspecified chronic kidney disease: Secondary | ICD-10-CM | POA: Diagnosis not present

## 2021-12-01 HISTORY — PX: BALLOON DILATION: SHX5330

## 2021-12-01 HISTORY — PX: ESOPHAGOGASTRODUODENOSCOPY (EGD) WITH PROPOFOL: SHX5813

## 2021-12-01 SURGERY — ESOPHAGOGASTRODUODENOSCOPY (EGD) WITH PROPOFOL
Anesthesia: Monitor Anesthesia Care

## 2021-12-01 MED ORDER — SODIUM CHLORIDE 0.9 % IV SOLN: INTRAVENOUS | Status: DC

## 2021-12-01 MED ORDER — PROPOFOL 500 MG/50ML IV EMUL
INTRAVENOUS | Status: DC | PRN
  Administered 2021-12-01: 120 ug/kg/min via INTRAVENOUS

## 2021-12-01 MED ORDER — LACTATED RINGERS IV SOLN: INTRAVENOUS | Status: DC

## 2021-12-01 MED ORDER — PROPOFOL 10 MG/ML IV BOLUS
INTRAVENOUS | Status: DC | PRN
  Administered 2021-12-01 (×6): 10 mg via INTRAVENOUS

## 2021-12-01 MED ORDER — LIDOCAINE HCL (CARDIAC) PF 100 MG/5ML IV SOSY
PREFILLED_SYRINGE | INTRAVENOUS | Status: DC | PRN
Start: 2021-12-01 — End: 2021-12-01
  Administered 2021-12-01: 100 mg via INTRAVENOUS

## 2021-12-01 SURGICAL SUPPLY — 15 items

## 2021-12-01 NOTE — Anesthesia Postprocedure Evaluation (Signed)
Anesthesia Post Note ? ?Patient: Jill Preston ? ?Procedure(s) Performed: ESOPHAGOGASTRODUODENOSCOPY (EGD) WITH PROPOFOL ?BALLOON DILATION ? ?  ? ?Patient location during evaluation: Endoscopy ?Anesthesia Type: MAC ?Level of consciousness: awake and alert ?Pain management: pain level controlled ?Vital Signs Assessment: post-procedure vital signs reviewed and stable ?Respiratory status: spontaneous breathing, nonlabored ventilation, respiratory function stable and patient connected to nasal cannula oxygen ?Cardiovascular status: blood pressure returned to baseline and stable ?Postop Assessment: no apparent nausea or vomiting ?Anesthetic complications: no ? ? ?No notable events documented. ? ?Last Vitals:  ?Vitals:  ? 12/01/21 1140 12/01/21 1150  ?BP: 135/70 (!) 142/64  ?Pulse: 64 62  ?Resp: 13 11  ?Temp:    ?SpO2: 92% 93%  ?  ?Last Pain:  ?Vitals:  ? 12/01/21 1150  ?TempSrc:   ?PainSc: 0-No pain  ? ? ?  ?  ?  ?  ?  ?  ? ?Barnet [place] ? ? ? ? ?

## 2021-12-01 NOTE — Op Note (Signed)
[facility] ?Patient Name: Jill Preston ?Procedure Date: 12/01/2021 ?MRN: [id number] ?Attending MD: Carol Ada , MD ?Date of Birth: 06/04/1933 ?CSN: [id number] ?Age: 88 ?Admit Type: Outpatient ?Procedure:                Upper GI endoscopy ?Indications:              Dysphagia ?Providers:                Carol Ada, MD, Mikey College, RN, Elberta Fortis  ?                          Johnella Moloney, Technician ?Referring MD:              ?Medicines:                 ?Complications:            No immediate complications. ?Estimated Blood Loss:     Estimated blood loss: none. ?Procedure:                Pre-Anesthesia Assessment: ?                          - Prior to the procedure, a History and Physical  ?                          was performed, and patient medications and  ?                          allergies were reviewed. The patient's tolerance of  ?                          previous anesthesia was also reviewed. The risks  ?                          and benefits of the procedure and the sedation  ?                          options and risks were discussed with the patient.  ?                          All questions were answered, and informed consent  ?                          was obtained. Prior Anticoagulants: The patient has  ?                          taken no previous anticoagulant or antiplatelet  ?                          agents. ASA Grade Assessment: III - A patient with  ?                          severe systemic disease. After reviewing the risks  ?                          and benefits, the patient  was deemed in  ?                          satisfactory condition to undergo the procedure. ?                          - Sedation was administered by an anesthesia  ?                          professional. Deep sedation was attained. ?                          After obtaining informed consent, the endoscope was  ?                          passed under direct vision. Throughout the  ?                           procedure, the patient's blood pressure, pulse, and  ?                          oxygen saturations were monitored continuously. The  ?                          [id number]-[id number] ([id number]) Olympus endoscope was introduced  ?                          through the mouth, and advanced to the second part  ?                          of duodenum. The upper GI endoscopy was  ?                          accomplished without difficulty. The patient  ?                          tolerated the procedure well. ?Scope In: ?Scope Out: ?Findings: ?     Small (< 5 mm) varices were found in the lower third of the esophagus. ?     Mild portal hypertensive gastropathy was found in the gastric fundus and  ?     in the gastric body. ?     The examined duodenum was normal. ?Impression:               - Small (< 5 mm) esophageal varices. ?                          - Portal hypertensive gastropathy. ?                          - Normal examined duodenum. ?                          - No specimens collected. ?Moderate Sedation: ?     Not Applicable - Patient had care per Anesthesia. ?Recommendation:           -  Patient has a contact number available for  ?                          emergencies. The signs and symptoms of potential  ?                          delayed complications were discussed with the  ?                          patient. Return to normal activities tomorrow.  ?                          Written discharge instructions were provided to the  ?                          patient. ?                          - Resume previous diet. ?                          - Continue present medications. ?                          - Return to GI clinic in 4 weeks with Dr. Collene Mares. ?Procedure Code(s):        --- Professional --- ?                          [phone], Esophagogastroduodenoscopy, flexible,  ?                          transoral; diagnostic, including collection of  ?                          specimen(s) by brushing or washing, when performed  ?                           (separate procedure) ?Diagnosis Code(s):        --- Professional --- ?                          I85.00, Esophageal varices without bleeding ?                          K76.6, Portal hypertension ?                          K31.89, Other diseases of stomach and duodenum ?                          R13.10, Dysphagia, unspecified ?CPT copyright 2019 [employer]. All rights reserved. ?The codes documented in this report are preliminary and upon coder review may  ?be revised to meet current compliance requirements. ?Carol Ada, MD ?Carol Ada, MD ?12/01/2021 11:27:07 AM ?This report has been signed electronically. ?Number of Addenda: 0 ?

## 2021-12-01 NOTE — H&P (Signed)
Jill Preston ?HPI: This 88 year old white female presents to the office for further evaluation of difficulty swallowing which started over the last 3-4 months ago. The swallowing problem has been worse with large pills. She has a chronic cough, which seems to be exacerbated during meals. She had a barium swallow done on 11/03/2021 which revealed mild to moderate non specific esophageal dysmotility disorder. Also noted was mild distal esophageal fold thickening which can be seen in a setting of esophagitis; there were no ulcerations or erosions noted and the 13 mm pill pass normally; also noted was aortic atherosclerosis. She was advised to take Omeprazole 40 mg two times per day. She reports seeing and ENT following a fall in October, 2022 when she had a right frontal hematoma and bruising. She reports having a negative laryngoscopy done in early February, 2023. She has 1 BM per day with no obvious blood or mucus in the stool. She has good appetite and her weight has been stable. She denies having any complaints of abdominal pain, nausea, vomiting  or odynophagia. She denies having a family history of colon cancer, celiac sprue or IBD. Her last colonoscopy was done on 03/07/2017 when biopsies of the ascending colon revealed normal colonic mucosa with no high grade dysplastic or malignancy. In 2011 a tubular adenoma was removed. She was diagnosed with hepatic cirrhosis in 2016 when a CT scan was done.  ? ?Past Medical History:  ?Diagnosis Date  ? Arthritis   ? right knee  ? Cancer [facility]) yrs ago  ? skin cancer removed from face  ? Chronic diastolic CHF (congestive heart failure) ([place])   ? Chronic venous insufficiency   ? LEA VENOUS, 10/17/2011 - mild reflux in bilateral common femoral veins  ? CKD (chronic kidney disease), stage III ([place])   ? Coronary artery disease   ? a. s/p PCI/BMS to prox LAD and balloon angioplasty to mLAD with suboptimal result in 2000. b. Abnl nuc 2012, cath 09/2011 - showed that the overall  territory of potential ischemia was small and attributable to a moderately diseased small second diagonal artery. Med rx.  ? Dyspnea   ? with activity  ? Headache   ? History of blood transfusion 2017  ? Hyperlipidemia   ? Hypertension   ? Hypertensive heart disease   ? Hypothyroidism   ? Obesity   ? Pneumonia   ? several times  ? Stroke [facility])   ? pt. states she had "light stroke" in sept. 1980  ? Urinary incontinence   ? ? ?Past Surgical History:  ?Procedure Laterality Date  ? ABDOMINAL HYSTERECTOMY  1970's  ? complete  ? ANKLE ARTHROSCOPY Right 07/10/2017  ? Procedure: ANKLE ARTHROSCOPY;  Surgeon: [name], DPM;  Location: [place];  Service: Podiatry;  Laterality: Right;  ? BACK SURGERY  07/26/2016  ? cervical neck surgery, [facility]  ? BLADDER SURGERY  2010  ? WITH MESH  bladder tach  ? bunion removal surgery Bilateral 15 yrs ago  ? CARDIAC CATHETERIZATION Left 09/25/2011  ? Medical management  ? CARDIAC CATHETERIZATION Left 03/25/2001  ? Normal LV function, LAD residual narrowing of less than 10%, normal ramus intermediate, circumflex, and RCA,   ? CARDIAC CATHETERIZATION  09/04/1999  ? LAD, 3x27m Tetra stent resulting in a reduction of the 80% stenosis to 0% residual  ? CARDIAC CATHETERIZATION N/A 01/26/2015  ? Procedure: Right/Left Heart Cath and Coronary Angiography;  Surgeon: TTroy Sine MD;  [facility] [place];  Service: Cardiovascular;  Laterality: N/A;  ? CARDIAC CATHETERIZATION N/A 01/27/2015  ? Procedure: Intravascular Pressure Wire/FFR Study;  Surgeon: Burnell Blanks, MD;  Location: [place] [facility];  Service: Cardiovascular;  Laterality: N/A;  ? CARDIAC CATHETERIZATION N/A 01/27/2015  ? Procedure: Right Heart Cath;  Surgeon: Burnell Blanks, MD;  Location: [place] [facility];  Service: Cardiovascular;  Laterality: N/A;  ? CHOLECYSTECTOMY    ? COLONOSCOPY WITH PROPOFOL N/A 03/07/2017  ? Procedure: COLONOSCOPY WITH PROPOFOL;  Surgeon: Juanita Craver, MD;   Location: [facility];  Service: Endoscopy;  Laterality: N/A;  ? CORONARY STENT PLACEMENT    ? LAD x 1  ? ganglion cyst removal  yrs ago  ? x 2  ? ILIAC VEIN ANGIOPLASTY / STENTING  02/15/2015  ? INTRAVASCULAR PRESSURE WIRE/FFR STUDY N/A 04/05/2017  ? Procedure: Intravascular Pressure Wire/FFR Study;  Surgeon: Martinique, Peter M, MD;  Location: [place] [facility];  Service: Cardiovascular;  Laterality: N/A;  ? IVC FILTER INSERTION  2016  ? IVC FILTER REMOVAL  02/15/2015  ? at [facility]  ? LEFT HEART CATH AND CORONARY ANGIOGRAPHY N/A 04/05/2017  ? Procedure: Left Heart Cath and Coronary Angiography;  Surgeon: Martinique, Peter M, MD;  Location: [place] [facility];  Service: Cardiovascular;  Laterality: N/A;  ? LEFT HEART CATHETERIZATION WITH CORONARY ANGIOGRAM N/A 09/25/2011  ? Procedure: LEFT HEART CATHETERIZATION WITH CORONARY ANGIOGRAM;  Surgeon: Sanda Klein, MD;  Location: [facility] C[facility] LAB;  Service: Cardiovascular;  Laterality: N/A;  ? multiple bladder surgeries to remove mesh    ? ROTATOR CUFF REPAIR Right   ? stent to groin Left 08/2014  ? left leg  ? TENDON REPAIR Right 07/10/2017  ? Procedure: RIGHT PERONEAL TENDON REPAIR;  Surgeon: [name], DPM;  Location: [place];  Service: Podiatry;  Laterality: Right;  ? TENDON REPAIR Right 05/27/2019  ? Procedure: PERONEAL TENDON REPAIR x2;  Surgeon: [name], DPM;  Location: [facility];  Service: Podiatry;  Laterality: Right;  ? ? ?Family History  ?Problem Relation Age of Onset  ? Cancer Mother   ? Heart disease Father   ? Heart disease Sister   ? High blood pressure Sister   ? Heart disease Sister   ? Cancer Sister   ? Heart disease Brother   ? Diabetes Brother   ? Brain cancer Brother   ? High blood pressure Brother   ? Heart disease Brother   ? Heart attack Brother   ? Heart disease Brother   ? High blood pressure Brother   ? ? ?Social History:  reports that she has never smoked. She has never used smokeless tobacco. She reports that she does not drink alcohol and does not  use drugs. ? ?Allergies:  ?Allergies  ?Allergen Reactions  ? Atorvastatin Anaphylaxis  ?  Patient does not remember; caused pneumonia- took her off of it per patient.   ? Penicillins Anaphylaxis, Hives and Swelling  ?  Tongue swelling ?Did it involve swelling of the face/tongue/throat, SOB, or low BP? Yes ?Did it involve sudden or severe rash/hives, skin peeling, or any reaction on the inside of your mouth or nose? Yes ?Did you need to seek medical attention at a hospital or doctor's office? Yes ?When did it last happen?  47 or 88 year old    ?If all above answers are ?NO?, may proceed with cephalosporin use. ?  ? Shellfish Allergy Anaphylaxis and Nausea And Vomiting  ?  Severe nausea and vomiting  ?  Aminophylline Itching, Swelling and Rash  ?  Pt experienced burning urination, itching, redness/rash, and swelling of genitals after given this medication via IV push.  ? Iodinated Contrast Media Swelling and Other (See Comments)  ?  SWELLING REACTION UNSPECIFIED  ?fFLUSHING  ? Latex Other (See Comments)  ?  Causes blisters  ? Oxybutynin Chloride Other (See Comments)  ?  "blisters"  ? Propoxyphene   ?  Unknown reaction  ? Vancomycin Hives  ?  06/02/2018- Hives arm, back and chest  ? Adhesive [Tape] Rash  ?  Paper tape is ok  ? Betadine [Povidone Iodine] Rash  ? Ciprofloxacin Hives  ? Codeine Nausea And Vomiting  ?  .  ? Ranolazine Other (See Comments)  ?  Constipation ? ?.  ? ? ?Medications: Scheduled: ?Continuous: ? ?No results found for this or any previous visit (from the past 24 hour(s)).  ? ?No results found. ? ?ROS:  As stated above in the HPI otherwise negative. ? ?There were no vitals taken for this visit.   ? ?PE: ?Gen: NAD, Alert and Oriented ?HEENT:  [place]/AT, EOMI ?Neck: Supple, no LAD ?Lungs: CTA Bilaterally ?CV: RRR without M/G/R ?ABD: Soft, NTND, +BS ?Ext: No C/C/E ? ?Assessment/Plan: ?1) Dysphagia. ?2) Cirrhosis ? ?Plan: ?1) EGD with possible dilation. ? ?Akeila Lana D ?12/01/2021, 10:10 AM  ? ? ?  ? ?

## 2021-12-01 NOTE — Transfer of Care (Signed)
Immediate Anesthesia Transfer of Care Note ? ?Patient: Jill Preston ? ?Procedure(s) Performed: ESOPHAGOGASTRODUODENOSCOPY (EGD) WITH PROPOFOL ?BALLOON DILATION ? ?Patient Location: Endoscopy Unit ? ?Anesthesia Type:MAC ? ?Level of Consciousness: awake, drowsy and patient cooperative ? ?Airway & Oxygen Therapy: Patient Spontanous Breathing and Patient connected to face mask oxygen ? ?Post-op Assessment: Report given to RN and Post -op Vital signs reviewed and stable ? ?Post vital signs: Reviewed and stable ? ?Last Vitals:  ?Vitals Value Taken Time  ?BP 119/68   ?Temp 36.8 ?C 12/01/21 1124  ?Pulse 75 12/01/21 1126  ?Resp 19 12/01/21 1126  ?SpO2 97 % 12/01/21 1126  ?Vitals shown include unvalidated device data. ? ?Last Pain:  ?Vitals:  ? 12/01/21 1124  ?TempSrc: Temporal  ?PainSc: Asleep  ?   ? ?  ? ?Complications: No notable events documented. ?

## 2021-12-01 NOTE — Anesthesia Preprocedure Evaluation (Addendum)
Anesthesia Evaluation  ?Patient identified by MRN, date of birth, ID band ?Patient awake ? ? ? ?Reviewed: ?Allergy & Precautions, NPO status , Patient's Chart, lab work & pertinent test results ? ?Airway ?Mallampati: II ? ?TM Distance: >3 FB ?Neck ROM: Full ? ? ? Dental ?no notable dental hx. ?(+) Edentulous Upper, Edentulous Lower ?  ?Pulmonary ? ?  ?Pulmonary exam normal ?breath sounds clear to auscultation ? ? ? ? ? ? Cardiovascular ?hypertension, + angina + CAD, + Cardiac Stents and + Peripheral Vascular Disease  ?Normal cardiovascular exam ?Rhythm:Regular Rate:Normal ? ?05/2020 TTE ??1. Left ventricular ejection fraction, by estimation, is 60 to 65%. The  ?left ventricle has normal function. The left ventricle has no regional  ?wall motion abnormalities. Left ventricular diastolic parameters are  ?consistent with Grade II diastolic  ?dysfunction (pseudonormalization). Elevated left ventricular end-diastolic  ?pressure.  ??2. Right ventricular systolic function is normal. The right ventricular  ?size is normal. There is normal pulmonary artery systolic pressure.  ??3. Left atrial size was moderately dilated.  ??4. The mitral valve is normal in structure. Trivial mitral valve  ?regurgitation. No evidence of mitral stenosis.  ??5. The aortic valve is tricuspid. Aortic valve regurgitation is not  ?visualized. No aortic stenosis is present.  ??6. The inferior vena cava is normal in size with greater than 50%  ?respiratory variability, suggesting right atrial pressure of 3 mmHg.  ?  ?Neuro/Psych ?CVA   ? GI/Hepatic ?  ?Endo/Other  ?Hyperthyroidism  ? Renal/GU ?Renal disease  ? ?  ?Musculoskeletal ? ? Abdominal ?(+) + obese (BMI 32.6),   ?Peds ? Hematology ?  ?Anesthesia Other Findings ?All: See list ? Reproductive/Obstetrics ? ?  ? ? ? ? ? ? ? ? ? ? ? ? ? ?  ?  ? ? ? ? ? ? ? ?Anesthesia Physical ?Anesthesia Plan ? ?ASA: 3 ? ?Anesthesia Plan: MAC  ? ?Post-op Pain Management:    ? ?Induction: Intravenous ? ?PONV Risk Score and Plan: Treatment may vary due to age or medical condition ? ?Airway Management Planned: Natural Airway and Nasal Cannula ? ?Additional Equipment: None ? ?Intra-op Plan:  ? ?Post-operative Plan:  ? ?Informed Consent: I have reviewed the patients History and Physical, chart, labs and discussed the procedure including the risks, benefits and alternatives for the proposed anesthesia with the patient or authorized representative who has indicated his/her understanding and acceptance.  ? ? ? ?Dental advisory given ? ?Plan Discussed with: CRNA and Anesthesiologist ? ?Anesthesia Plan Comments: (EGD for Dysphagia)  ? ? ? ? ? ?Anesthesia Quick Evaluation ? ?

## 2021-12-04 ENCOUNTER — Telehealth: Payer: Self-pay | Admitting: Cardiovascular Disease

## 2021-12-04 ENCOUNTER — Encounter (HOSPITAL_COMMUNITY): Payer: Self-pay | Admitting: Gastroenterology

## 2021-12-04 NOTE — Telephone Encounter (Signed)
LMTCB

## 2021-12-04 NOTE — Telephone Encounter (Signed)
Patient has questions about her EGD.  ? ?Pt c/o swelling: STAT is pt has developed SOB within 24 hours ? ?If swelling, where is the swelling located? Hands, stomach, legs, feet and ankles ? ?How much weight have you gained and in what time span? 10 lbs two weeks.  ? ?Have you gained 3 pounds in a day or 5 pounds in a week? Yes, gained 3lbs in a day.  ? ?Do you have a log of your daily weights (if so, list)? States she gaining about a pound a day. For the past two weeks.  ? ?Are you currently taking a fluid pill? yes ? ?Are you currently SOB? No, only when she tries to do something ? ?Have you traveled recently? no  ?

## 2021-12-05 ENCOUNTER — Other Ambulatory Visit: Payer: Self-pay

## 2021-12-05 DIAGNOSIS — N183 Chronic kidney disease, stage 3 unspecified: Secondary | ICD-10-CM

## 2021-12-05 DIAGNOSIS — I5032 Chronic diastolic (congestive) heart failure: Secondary | ICD-10-CM

## 2021-12-05 MED ORDER — TORSEMIDE 100 MG PO TABS: 50.0000 mg | ORAL_TABLET | Freq: Two times a day (BID) | ORAL | 0 refills | Status: DC

## 2021-12-05 NOTE — Telephone Encounter (Signed)
LMTCB

## 2021-12-05 NOTE — Telephone Encounter (Signed)
Patient reports that for the past week, every day, she has been over 190 pounds and has taken an extra 80 mg furosemide. She states her body feels tight, has no energy, and has exertional sob. While on phone, BP 129/73, P 71. She watches sodium intake and fluid intake. She stated she is concerned and doesn't know what else to do.  ?

## 2021-12-05 NOTE — Telephone Encounter (Signed)
Patient informed to stop taking furosemide and to begin taking torsemide 50 mg twice a day (one-half of 100 mg tablets twice daily). Patient will come to [facility] next Thursday for BMET and BNP. Patient will call clinic this Thursday to report BP, weight, and if her clothes are fitting less tight. Orders placed. ?

## 2021-12-05 NOTE — Telephone Encounter (Signed)
She developed acute renal failure when we tried to add metolazone in the past, even after just one dose.. Let's try switching to torsemide 100 mg tabs, take half a tab twice daily, please. Give her 30 tabs and will adjust after we see how that works. BMET and BNP in one week please. ?

## 2021-12-05 NOTE — Telephone Encounter (Signed)
? ?  Pt is returning call, she said she did try taking an extra fluid pill today because she gained 2 lbs in 24 hours  ?

## 2021-12-08 ENCOUNTER — Ambulatory Visit (HOSPITAL_COMMUNITY)
Admission: RE | Admit: 2021-12-08 | Discharge: 2021-12-08 | Disposition: A | Discharge status: Home / Self Care | Payer: HMO | Source: Ambulatory Visit | Attending: Orthopaedic Surgery | Admitting: Orthopaedic Surgery

## 2021-12-08 ENCOUNTER — Other Ambulatory Visit (HOSPITAL_COMMUNITY): Payer: Self-pay | Admitting: Orthopaedic Surgery

## 2021-12-08 ENCOUNTER — Other Ambulatory Visit: Payer: Self-pay

## 2021-12-08 DIAGNOSIS — M7989 Other specified soft tissue disorders: Secondary | ICD-10-CM

## 2021-12-08 DIAGNOSIS — M25572 Pain in left ankle and joints of left foot: Secondary | ICD-10-CM | POA: Diagnosis not present

## 2021-12-11 DIAGNOSIS — M545 Low back pain, unspecified: Secondary | ICD-10-CM | POA: Diagnosis not present

## 2021-12-13 DIAGNOSIS — N183 Chronic kidney disease, stage 3 unspecified: Secondary | ICD-10-CM | POA: Diagnosis not present

## 2021-12-13 DIAGNOSIS — I5032 Chronic diastolic (congestive) heart failure: Secondary | ICD-10-CM | POA: Diagnosis not present

## 2021-12-14 LAB — BASIC METABOLIC PANEL
BUN/Creatinine Ratio: 18 (ref 12–28)
BUN: 15 mg/dL (ref 8–27)
CO2: 37 mmol/L — ABNORMAL HIGH (ref 20–29)
Calcium: 9.2 mg/dL (ref 8.7–10.3)
Chloride: 94 mmol/L — ABNORMAL LOW (ref 96–106)
Creatinine, Ser: 0.83 mg/dL (ref 0.57–1.00)
Glucose: 88 mg/dL (ref 70–99)
Potassium: 3.2 mmol/L — ABNORMAL LOW (ref 3.5–5.2)
Sodium: 142 mmol/L (ref 134–144)
eGFR: 69 mL/min/{1.73_m2} (ref >59)

## 2021-12-14 LAB — BRAIN NATRIURETIC PEPTIDE: BNP: 124 pg/mL — ABNORMAL HIGH (ref 0.0–100.0)

## 2021-12-15 DIAGNOSIS — M48062 Spinal stenosis, lumbar region with neurogenic claudication: Secondary | ICD-10-CM | POA: Diagnosis not present

## 2021-12-19 DIAGNOSIS — I4891 Unspecified atrial fibrillation: Secondary | ICD-10-CM | POA: Diagnosis not present

## 2021-12-19 DIAGNOSIS — Z515 Encounter for palliative care: Secondary | ICD-10-CM | POA: Diagnosis not present

## 2021-12-19 DIAGNOSIS — I8500 Esophageal varices without bleeding: Secondary | ICD-10-CM | POA: Diagnosis not present

## 2021-12-20 ENCOUNTER — Encounter: Payer: Self-pay | Admitting: Cardiovascular Disease

## 2021-12-20 ENCOUNTER — Ambulatory Visit: Payer: HMO | Admitting: Cardiovascular Disease

## 2021-12-20 VITALS — BP 110/60 | HR 67 | Ht 65.5 in | Wt 199.0 lb

## 2021-12-20 DIAGNOSIS — I5032 Chronic diastolic (congestive) heart failure: Secondary | ICD-10-CM

## 2021-12-20 DIAGNOSIS — I10 Essential (primary) hypertension: Secondary | ICD-10-CM

## 2021-12-20 DIAGNOSIS — I25118 Atherosclerotic heart disease of native coronary artery with other forms of angina pectoris: Secondary | ICD-10-CM | POA: Diagnosis not present

## 2021-12-20 DIAGNOSIS — I739 Peripheral vascular disease, unspecified: Secondary | ICD-10-CM

## 2021-12-20 DIAGNOSIS — E669 Obesity, unspecified: Secondary | ICD-10-CM | POA: Diagnosis not present

## 2021-12-20 DIAGNOSIS — E7800 Pure hypercholesterolemia, unspecified: Secondary | ICD-10-CM

## 2021-12-20 MED ORDER — KLOR-CON M20 20 MEQ PO TBCR: 20.0000 meq | EXTENDED_RELEASE_TABLET | Freq: Three times a day (TID) | ORAL | 3 refills | Status: DC

## 2021-12-20 MED ORDER — TORSEMIDE 100 MG PO TABS: ORAL_TABLET | ORAL | 4 refills | Status: DC

## 2021-12-20 NOTE — Progress Notes (Signed)
Patient ID: Jill Preston, female   DOB: December 20, 1932, 88 y.o.   MRN: [id number]    Cardiology Office Note    Date:  12/20/2021   ID:  Jill Preston, DOB 1933/02/20, MRN [id number]  PCP:  Sandrea Hughs, NP  Cardiologist:   Sanda Klein, MD   Chief Complaint  Patient presents with   Follow-up    3 months.     History of Present Illness:  Jill Preston is a 88 y.o. female returning in follow-up for CAD and CHF.  Has a moderate stenosis of the left main coronary artery, not  hemodynamically significant by FFR analysis in 2016 and again in 2018.  She has chronic diastolic heart failure that responds well to diuretics, but excessive diuresis has occasionally led to acute kidney injury.  Doing fairly well.  Biggest problems remained back pain that has been the pain over existence since her fall last October, as well as occasional right knee pain.  These too often keep her up at night so she has poor sleep.  She has a planned epidural injection in the near future.  She does not complain of dyspnea as much as she does of fatigue.  Her edema is a little worse in the lower extremities, but she has not had any weeping from the skin, which often occurs at the site of an old scar in the left pretibial area.  She has gained some weight and is up to 198 pounds on her home scale (we have been estimated that her "dry weight on her home scale" is around 190 pounds).  Our office scale usually e  stimates a weight that is 2 pounds higher than her home scale).  She has not had angina.  She has dysphagia and severe reflux which causes a cough.  She has to eat very soft foods, carefully chewed or pured.  She denies palpitations and has not had dizziness or syncope.  She has not had any new falls, but she does have an unexplained small bruise on her right shin.  Overall NYHA functional class 2-3.  Recently performed labs showed a potassium of 3.2 with normal creatinine level.  In October she had  a fall.  She made a sudden change of the head and developed severe vertigo.  She did not have syncope.  She did hit her head and have a right periorbital hematoma.  Imaging did not show any evidence of intracranial bleeding.   She has a long history of coronary disease. She underwent proximal LAD bare-metal stenting and balloon angioplasty of the mid LAD in 2013. She has had persistent ischemia in the territory of the diagonal artery on nuclear stress test performed over the last few years. Coronary angiography performed May 2016 showed a widely patent LAD stent, 60% ostial circumflex stenosis, 20-30% right coronary artery stenosis. There was concern about possible left coronary artery stenosis but fractional flow reserve was normal (0.96 at baseline, 0.91 during intravenous adenosine infusion). She had good anginal response to Ranexa but developed severe constipation and could not tolerate the medication. She has been intolerant to beta blockers due to bradycardia. She has preserved left ventricular systolic function but has had episodes of acute exacerbation of diastolic heart failure attributed to hypertensive heart disease. December 2015, she was critically ill with an acute left iliofemoral DVT with anticoagulation complicated by retroperitoneal hematoma and hemorrhagic shock, requiring placement of an inferior vena cava filter. The filter was removed in June 2016. Coronary angiography  in July 2018 showed unchanged anatomy of the eccentric stenosis in the left main coronary artery with noncritical FFR (0.89 in the LAD, 0.86 in the LCx).  Intravascular Pressure Wire/FFR Study  Left Heart Cath and Coronary Angiography  Conclusion     LM lesion, 70 %stenosed. Mid LAD lesion, 0 %stenosed at site of prior stent. Ost Cx lesion, 60 %stenosed. Prox RCA-1 lesion, 20 %stenosed. Prox RCA-2 lesion, 30 %stenosed. The left ventricular systolic function is normal. LV end diastolic pressure is normal. The left  ventricular ejection fraction is 55-65% by visual estimate.   1. 70% eccentric distal left main stenosis. Angiographically similar to 2016. FFR 0.89 into the LAD and 0.86 into the LCx suggesting that this lesion is not flow limiting.  2. Otherwise nonobstructive CAD. Patent stent in LAD. 3. Normal LV function 4. Normal LVEDP   Plan: recommend continued medical therapy.     Past Medical History:  Diagnosis Date   Arthritis    right knee   Cancer ([place]) yrs ago   skin cancer removed from face   Chronic diastolic CHF (congestive heart failure) (HCC)    Chronic venous insufficiency    LEA VENOUS, 10/17/2011 - mild reflux in bilateral common femoral veins   CKD (chronic kidney disease), stage III ([place])    Coronary artery disease    a. s/p PCI/BMS to prox LAD and balloon angioplasty to mLAD with suboptimal result in 2000. b. Abnl nuc 2012, cath 09/2011 - showed that the overall territory of potential ischemia was small and attributable to a moderately diseased small second diagonal artery. Med rx.   Dyspnea    with activity   Headache    History of blood transfusion 2017   Hyperlipidemia    Hypertension    Hypertensive heart disease    Hypothyroidism    Obesity    Pneumonia    several times   Stroke [facility])    pt. states she had "light stroke" in sept. 1980   Urinary incontinence     Past Surgical History:  Procedure Laterality Date   ABDOMINAL HYSTERECTOMY  1970's   complete   ANKLE ARTHROSCOPY Right 07/10/2017   Procedure: ANKLE ARTHROSCOPY;  Surgeon: [name], DPM;  Location: [place];  Service: Podiatry;  Laterality: Right;   BACK SURGERY  07/26/2016   cervical neck surgery, [facility]   BALLOON DILATION N/A 12/01/2021   Procedure: BALLOON DILATION;  Surgeon: Carol Ada, MD;  [facility];  Service: Gastroenterology;  Laterality: N/A;   BLADDER SURGERY  2010   WITH MESH  bladder tach   bunion removal surgery Bilateral 15 yrs ago   CARDIAC  CATHETERIZATION Left 09/25/2011   Medical management   CARDIAC CATHETERIZATION Left 03/25/2001   Normal LV function, LAD residual narrowing of less than 10%, normal ramus intermediate, circumflex, and RCA,    CARDIAC CATHETERIZATION  09/04/1999   LAD, 3x65mm Tetra stent resulting in a reduction of the 80% stenosis to 0% residual   CARDIAC CATHETERIZATION N/A 01/26/2015   Procedure: Right/Left Heart Cath and Coronary Angiography;  Surgeon: Troy Sine, MD;  Location: [place] [facility];  Service: Cardiovascular;  Laterality: N/A;   CARDIAC CATHETERIZATION N/A 01/27/2015   Procedure: Intravascular Pressure Wire/FFR Study;  Surgeon: Burnell Blanks, MD;  Location: [place] [facility];  Service: Cardiovascular;  Laterality: N/A;   CARDIAC CATHETERIZATION N/A 01/27/2015   Procedure: Right Heart Cath;  Surgeon: Burnell Blanks, MD;  Location: [place] [facility];  Service: Cardiovascular;  Laterality: N/A;   CHOLECYSTECTOMY     COLONOSCOPY WITH PROPOFOL N/A 03/07/2017   Procedure: COLONOSCOPY WITH PROPOFOL;  Surgeon: Juanita Craver, MD;  [facility];  Service: Endoscopy;  Laterality: N/A;   CORONARY STENT PLACEMENT     LAD x 1   ESOPHAGOGASTRODUODENOSCOPY (EGD) WITH PROPOFOL N/A 12/01/2021   Procedure: ESOPHAGOGASTRODUODENOSCOPY (EGD) WITH PROPOFOL;  Surgeon: Carol Ada, MD;  [facility];  Service: Gastroenterology;  Laterality: N/A;   ganglion cyst removal  yrs ago   x 2   ILIAC VEIN ANGIOPLASTY / STENTING  02/15/2015   INTRAVASCULAR PRESSURE WIRE/FFR STUDY N/A 04/05/2017   Procedure: Intravascular Pressure Wire/FFR Study;  Surgeon: Martinique, Peter M, MD;  Location: [place] [facility];  Service: Cardiovascular;  Laterality: N/A;   IVC FILTER INSERTION  2016   IVC FILTER REMOVAL  02/15/2015   at [place] CATH AND CORONARY ANGIOGRAPHY N/A 04/05/2017   Procedure: Left Heart Cath and Coronary Angiography;  Surgeon: Martinique, Peter M, MD;  Location: [place] [facility]  [facility];  Service: Cardiovascular;  Laterality: N/A;   LEFT HEART CATHETERIZATION WITH CORONARY ANGIOGRAM N/A 09/25/2011   Procedure: LEFT HEART CATHETERIZATION WITH CORONARY ANGIOGRAM;  Surgeon: Sanda Klein, MD;  Location: [place] C[facility];  Service: Cardiovascular;  Laterality: N/A;   multiple bladder surgeries to remove mesh     ROTATOR CUFF REPAIR Right    stent to groin Left 08/2014   left leg   TENDON REPAIR Right 07/10/2017   Procedure: RIGHT PERONEAL TENDON REPAIR;  Surgeon: [name], DPM;  Location: [place];  Service: Podiatry;  Laterality: Right;   TENDON REPAIR Right 05/27/2019   Procedure: PERONEAL TENDON REPAIR x2;  Surgeon: [name], DPM;  [facility];  Service: Podiatry;  Laterality: Right;    Outpatient Medications Prior to Visit  Medication Sig Dispense Refill   acetaminophen (TYLENOL) 500 MG tablet Take 500 mg by mouth every 6 (six) hours as needed for moderate pain or headache.     amLODipine (NORVASC) 2.5 MG tablet Take 1 tablet (2.5 mg total) by mouth daily. 90 tablet 3   Ascorbic Acid (VITAMIN C) 1000 MG tablet Take 2,000 mg by mouth daily.     BOUDREAUXS BUTT PASTE 40 % ointment Apply 1 application. topically 2 (two) times daily.     cholecalciferol (VITAMIN D3) 25 MCG (1000 UNIT) tablet Take 1,000 Units by mouth daily.      COLLAGEN PO Take 1 Scoop by mouth daily.     Docusate Sodium 100 MG capsule Take 200 mg by mouth daily at 8 pm.     ezetimibe (ZETIA) 10 MG tablet TAKE 1 TABLET(10 MG) BY MOUTH DAILY 90 tablet 3   fluticasone (FLONASE) 50 MCG/ACT nasal spray Place 2 sprays into both nostrils daily. (Patient taking differently: Place 2 sprays into both nostrils daily as needed for allergies.) 16 g 6   ketoconazole (NIZORAL) 2 % shampoo Apply 1 application. topically daily as needed for irritation.     levothyroxine (SYNTHROID) 88 MCG tablet Take 1 tablet (88 mcg total) by mouth daily. 90 tablet 1   Multiple Vitamins-Minerals (HAIR/SKIN/NAILS) TABS Take 1  tablet by mouth daily.     omeprazole (PRILOSEC) 40 MG capsule Take 1 capsule (40 mg total) by mouth 2 (two) times daily. 60 capsule 3   OVER THE COUNTER MEDICATION Take 2 tablets by mouth daily. "Focus factor"     OVER THE COUNTER MEDICATION Take 1 Scoop  by mouth daily. Super beet powder     pravastatin (PRAVACHOL) 80 MG tablet Take 80 mg by mouth at bedtime.     TURMERIC PO Take 300 mg by mouth daily.     KLOR-CON M20 20 MEQ tablet Take 1 tablet (20 mEq total) by mouth See admin instructions. Take one tablet (20 meq) by mouth every morning and with each afternoon dose of lasix 180 tablet 3   torsemide (DEMADEX) 100 MG tablet Take 0.5 tablets (50 mg total) by mouth 2 (two) times daily. 30 tablet 0   loratadine (CLARITIN) 10 MG tablet Take 1 tablet (10 mg total) by mouth daily for 14 days. (Patient taking differently: Take 10 mg by mouth daily as needed for allergies.) 14 tablet 0   No facility-administered medications prior to visit.     Allergies:   Atorvastatin, Penicillins, Shellfish allergy, Aminophylline, Iodinated contrast media, Latex, Oxybutynin chloride, Propoxyphene, Vancomycin, Adhesive [tape], Betadine [povidone iodine], Ciprofloxacin, Codeine, and Ranolazine   Social History   Socioeconomic History   Marital status: Married    Spouse name: Not on file   Number of children: Not on file   Years of education: Not on file   Highest education level: Not on file  Occupational History   Not on file  Tobacco Use   Smoking status: Never   Smokeless tobacco: Never  Vaping Use   Vaping Use: Never used  Substance and Sexual Activity   Alcohol use: No   Drug use: No   Sexual activity: Not on file  Other Topics Concern   Not on file  Social History Narrative   Per [facility] New Patient Packet Abstracted on 03/06/2021      Diet: Left blank       Caffeine: No      Married, if yes what year: Yes, 1955      Do you live in a house, apartment, assisted living, condo, trailer, ect:  House      Is it one or more stories: 1 story      How many persons live in your home? 2      Pets: No      Highest level or education completed: 12 th grade      Current/Past profession: Futures trader       Exercise:          Yes       Type and how often: Walking          Living Will: Yes   DNR: No   POA/HPOA: Yes      Functional Status:   Do you have difficulty bathing or dressing yourself? Left Blank    Do you have difficulty preparing food or eating?Left Blank   Do you have difficulty managing your medications?Left Blank   Do you have difficulty managing your finances?Left Blank   Do you have difficulty affording your medications?Left Blank   Social Determinants of Health   Financial Resource Strain: Not on file  Food Insecurity: Not on file  Transportation Needs: Not on file  Physical Activity: Not on file  Stress: Not on file  Social Connections: Not on file     Family History:  The patient's family history includes Brain cancer in her brother; Cancer in her mother and sister; Diabetes in her brother; Heart attack in her brother; Heart disease in her brother, brother, brother, father, sister, and sister; High blood pressure in her brother, brother, and sister.   ROS:   Please  see the history of present illness.    ROS All other systems are reviewed and are negative.   PHYSICAL EXAM:   VS:  BP 110/60 (BP Location: Left Arm, Patient Position: Sitting, Cuff Size: Large)   Pulse 67   Ht 5' 5.5" (1.664 m)   Wt 199 lb (90.3 kg)   BMI 32.61 kg/m       General: Alert, oriented x3, no distress, mildly obese Head: no evidence of trauma, PERRL, EOMI, no exophtalmos or lid lag, no myxedema, no xanthelasma; normal ears, nose and oropharynx Neck: normal jugular venous pulsations and no hepatojugular reflux; brisk carotid pulses without delay and no carotid bruits Chest: clear to auscultation, no signs of consolidation by percussion or palpation, normal  fremitus, symmetrical and full respiratory excursions Cardiovascular: normal position and quality of the apical impulse, regular rhythm, normal first and second heart sounds, no murmurs, rubs or gallops Abdomen: no tenderness or distention, no masses by palpation, no abnormal pulsatility or arterial bruits, normal bowel sounds, no hepatosplenomegaly Extremities: no clubbing, cyanosis; 2+ symmetrical pedal ankle and lower pretibial edema; 2+ radial, ulnar and brachial pulses bilaterally; 2+ right femoral, posterior tibial and dorsalis pedis pulses; 2+ left femoral, posterior tibial and dorsalis pedis pulses; no subclavian or femoral bruits Neurological: grossly nonfocal Psych: Normal mood and affect    Wt Readings from Last 3 Encounters:  12/20/21 199 lb (90.3 kg)  12/01/21 198 lb (89.8 kg)  11/16/21 199 lb (90.3 kg)      Studies/Labs Reviewed:   EKG:  EKG is not ordered today.  The most recent tracing from 10/19/2021 is personally reviewed, shows normal sinus rhythm and is a normal tracing. Recent Labs: 04/14/2021: ALT 24 10/16/2021: Hemoglobin 15.4; Platelets 144 11/02/2021: TSH 3.06 12/13/2021: BNP 124.0; BUN 15; Creatinine, Ser 0.83; Potassium 3.2; Sodium 142   Lipid Panel    Component Value Date/Time   CHOL 136 09/05/2021 1008   CHOL 193 11/18/2017 0857   TRIG 78 09/05/2021 1008   HDL 69 09/05/2021 1008   HDL 47 11/18/2017 0857   CHOLHDL 2.0 09/05/2021 1008   VLDL 48 (H) 06/27/2015 1336   LDLCALC 51 09/05/2021 1008      Labs 03/23/2020 Total cholesterol 145, HDL 55, LDL 69, triglycerides 118 TSH 0.886  09/21/2020 Total cholesterol 122, HDL 56, LDL 47, triglycerides 101  09/05/2021 Cholesterol 136, HDL 69, LDL 51, triglycerides 78   CATH 04/05/2017  LM lesion, 70 %stenosed. Mid LAD lesion, 0 %stenosed at site of prior stent. Ost Cx lesion, 60 %stenosed. Prox RCA-1 lesion, 20 %stenosed. Prox RCA-2 lesion, 30 %stenosed. The left ventricular systolic function is  normal. LV end diastolic pressure is normal. The left ventricular ejection fraction is 55-65% by visual estimate.   1. 70% eccentric distal left main stenosis. Angiographically similar to 2016. FFR 0.89 into the LAD and 0.86 into the LCx suggesting that this lesion is not flow limiting.  2. Otherwise nonobstructive CAD. Patent stent in LAD. 3. Normal LV function 4. Normal LVEDP   Plan: recommend continued medical therapy.    ASSESSMENT:    1. Chronic diastolic heart failure ([place])   2. Coronary artery disease of native artery of native heart with stable angina pectoris ([place])   3. Essential hypertension   4. PAD (peripheral artery disease) ([place])   5. Hypercholesterolemia   6. Mild obesity      PLAN:  In order of problems listed above:  CHF: Appears to be roughly 8 pounds above her "dry weight  of 190 pounds on her home scale.  We will put her on a weight-based prescription of torsemide: 50 mg once daily if less than 195 pounds, 100 mg in the morning +50 mg in the afternoon if over that weight.  Also need to increase her potassium supplement to 20 mEq 3 times daily.  Previous attempts at aggressive diuresis using metolazone has led to acute kidney injury on a couple of occasions.  Reminded her of the importance of sodium dietary restriction, but I do not think we should change her prescription at this time.  Call if her weight exceeds 195 pounds. CAD: Does not have current problems with angina pectoris..  She has a known moderate stenosis in the left coronary artery that was not significant by FFR performed as recently as 2018.  Nuclear stress testing would not be useful to evaluate this due to the likelihood of balanced ischemia.  If necessary, would follow this up with coronary CT angiography.  However I do not think she would be a candidate for bypass surgery anymore, with advancing age and worsening functional status.  She does not tolerate beta-blockers due to bradycardia and ranolazine  caused severe constipation. HTN: Well-controlled.  Always check blood pressure in the right arm (she has some degree of left subclavian stenosis). PAD: Known left subclavian stenosis, without subclavian steal syndrome or claudication. Hyperlipidemia: All lipid parameters in target range. Obesity: Mildly obese. Neurological complaints: She had some complaints that might have represented a TIA earlier this year, but I wonder whether they were more related to anxiety and frustration.  No recent problems to suggest TIA or stroke.  Repeat carotid duplex ultrasound performed 11/03/2021 shows widely patent carotids and antegrade flow in the vertebral arteries bilaterally, without significant plaque leaving arteries were not mentioned, but on their evaluation at that time the blood pressure was actually little higher on the left than on the right.   Medication Adjustments/Labs and Tests Ordered: Current medicines are reviewed at length with the patient today.  Concerns regarding medicines are outlined above.  Medication changes, Labs and Tests ordered today are listed in the Patient Instructions below. Patient Instructions  Medication Instructions:  TORSEMIDE: For a weight less than 195 lbs, take 50 mg (half a tablet) in the AM and 50 mg in the pm. For a weight of 196 lbs and higher, take 100 mg in the AM and 50 mg in the PM  POTASSIUM: Increase the potassium to 20 mEq three times daily *If you need a refill on your cardiac medications before your next appointment, please call your pharmacy*   Lab Work: Your provider would like for you to return in one month to have the following labs drawn: BMET. You do not need an appointment for the lab. Once in our office lobby there is a podium where you can sign in and ring the doorbell to alert [place] that you are here. The lab is open from 8:00 am to 4 pm; closed for lunch from 12:45pm-1:45pm.  You may also go to any of these [facility] locations:   [place] ([place]) - [place] [address] [place] [address] - [address] [place] - [address] ([facility]  If you have labs (blood work) drawn today and your tests are completely normal, you will receive your results only by: [employer] (if you have MyChart) OR A paper copy in the mail If you have any lab test that is abnormal or we need to change your treatment, we will call you to review the results.   Testing/Procedures: None ordered   Follow-Up: At [facility], you and your health needs are our priority.  As part of our continuing mission to provide you with exceptional heart care, we have created designated Provider Care Teams.  These Care Teams include your primary Cardiologist (physician) and Advanced Practice Providers (APPs -  Physician Assistants and Nurse Practitioners) who all work together to provide you with the care you need, when you need it.  We recommend signing up for the patient portal called "MyChart".  Sign up information is provided on this After Visit Summary.  MyChart is used to connect with patients for Virtual Visits (Telemedicine).  Patients are able to view lab/test results, encounter notes, upcoming appointments, etc.  Non-urgent messages can be sent to your provider as well.   To learn more about what you can do with MyChart, go to [email or web address].    Your next appointment:   3 month(s)  The format for your next appointment:   In Person  Provider:   Sanda Klein, MD {       Signed, Sanda Klein, MD  12/20/2021 7:57 PM    [address] [place], [place], [place]  [address] Phone: [phone]; Fax: [phone]

## 2021-12-20 NOTE — Patient Instructions (Signed)
Medication Instructions:  ?TORSEMIDE: For a weight less than 195 lbs, take 50 mg (half a tablet) in the AM and 50 mg in the pm. For a weight of 196 lbs and higher, take 100 mg in the AM and 50 mg in the PM ? ?POTASSIUM: Increase the potassium to 20 mEq three times daily ?*If you need a refill on your cardiac medications before your next appointment, please call your pharmacy* ? ? ?Lab Work: ?Your provider would like for you to return in one month to have the following labs drawn: BMET. You do not need an appointment for the lab. Once in our office lobby there is a podium where you can sign in and ring the doorbell to alert [place] that you are here. The lab is open from 8:00 am to 4 pm; closed for lunch from 12:45pm-1:45pm. ? ?You may also go to any of these [facility] locations: ?  ?[place] ?- [place] ([facility]) ?- [address] ?- [place]  ?[place] ?- [address]  ?  ?[place]  ?- [place] [address]  ?  ?[place] ?- [place]  ?  ?- [place] ?- [place] [address] ([facility] ? ?If you have labs (blood work) drawn today and your tests are completely normal, you will receive your results only by: ?MyChart Message (if you have MyChart) OR ?A paper copy in the mail ?If you have any lab test that is abnormal or we need to change your treatment, we will call you to review the results. ? ? ?Testing/Procedures: ?None ordered ? ? ?Follow-Up: ?At [facility], you and your health needs are our priority.  As part of our continuing mission to provide you with exceptional heart care, we have created designated Provider Care Teams.  These Care Teams include your primary Cardiologist (physician) and Advanced Practice Providers (APPs -  Physician Assistants and Nurse Practitioners) who all work together to provide you with the care you need, when you need  it. ? ?We recommend signing up for the patient portal called "MyChart".  Sign up information is provided on this After Visit Summary.  MyChart is used to connect with patients for Virtual Visits (Telemedicine).  Patients are able to view lab/test results, encounter notes, upcoming appointments, etc.  Non-urgent messages can be sent to your provider as well.   ?To learn more about what you can do with MyChart, go to [email or web address].   ? ?Your next appointment:   ?3 month(s) ? ?The format for your next appointment:   ?In Person ? ?Provider:   ?Sanda Klein, MD { ? ? ?

## 2021-12-25 ENCOUNTER — Ambulatory Visit (INDEPENDENT_AMBULATORY_CARE_PROVIDER_SITE_OTHER): Payer: HMO | Admitting: Family

## 2021-12-25 ENCOUNTER — Encounter: Payer: Self-pay | Admitting: Family

## 2021-12-25 VITALS — BP 120/70 | HR 72 | Temp 98.6°F | Resp 16 | Ht 65.5 in | Wt 202.0 lb

## 2021-12-25 DIAGNOSIS — R2681 Unsteadiness on feet: Secondary | ICD-10-CM | POA: Diagnosis not present

## 2021-12-25 DIAGNOSIS — I5032 Chronic diastolic (congestive) heart failure: Secondary | ICD-10-CM

## 2021-12-25 DIAGNOSIS — M159 Polyosteoarthritis, unspecified: Secondary | ICD-10-CM | POA: Diagnosis not present

## 2021-12-25 DIAGNOSIS — M150 Primary generalized (osteo)arthritis: Secondary | ICD-10-CM

## 2021-12-25 NOTE — Progress Notes (Signed)
[phone]  Provider: Webb Silversmith [name] FNP-C  Jill Preston, [name], NP  Patient Care Team: [name], [name], NP as PCP - General (Family Medicine) Croitoru, Dani Gobble, MD as PCP - Cardiology (Cardiology) Croitoru, Dani Gobble, MD as Consulting Physician (Cardiology) Druscilla Brownie, MD as Referring Physician (Dermatology) [name], DPM as Consulting Physician (Podiatry) [name], [name], [place] (Optometry) [name], MD as Referring Physician (Gynecology)  Extended Emergency Contact Information Primary Emergency Contact: Flewellen,[name] Phone: [phone] Mobile Phone: [phone] Relation: Son Secondary Emergency Contact: [facility] Phone: [phone] Mobile Phone: [phone] Relation: Friend  Code Status:  Full Code  Goals of care: Advanced Directive information    12/25/2021    9:26 AM  Advanced Directives  Does Patient Have a Medical Advance Directive? Yes  Type of Advance Directive Out of facility DNR (pink MOST or yellow form)  Does patient want to make changes to medical advance directive? No - Patient declined     Chief Complaint  Patient presents with   Acute Visit    Evaluation for wheelchair per [facility] care team.     HPI:  Pt is a 88 y.o. female seen today for an acute visit for evaluation for wheelchair per [facility] request.  She requires DME electric Scooter to allow her to maintain current level of independence with ADL's which cannot be achieved with Rolling walker or cane. Patient suffers from chronic Congestive Heart failure,chronic lower back pain,Osteoarthritis of multiple site and obesity which impairs her ability to perform daily activities like bathing,walking,dressing,grooming and toileting in the home.she is alert and oriented, but able and willing to operate a scooter.  Has upcoming evaluation for her low back pain for possible surgery.    Past Medical History:   Diagnosis Date   Arthritis    right knee   Cancer ([place]) yrs ago   skin cancer removed from face   Chronic diastolic CHF (congestive heart failure) (HCC)    Chronic venous insufficiency    LEA VENOUS, 10/17/2011 - mild reflux in bilateral common femoral veins   CKD (chronic kidney disease), stage III ([place])    Coronary artery disease    a. s/p PCI/BMS to prox LAD and balloon angioplasty to mLAD with suboptimal result in 2000. b. Abnl nuc 2012, cath 09/2011 - showed that the overall territory of potential ischemia was small and attributable to a moderately diseased small second diagonal artery. Med rx.   Dyspnea    with activity   Headache    History of blood transfusion 2017   Hyperlipidemia    Hypertension    Hypertensive heart disease    Hypothyroidism    Obesity    Pneumonia    several times   Stroke [facility])    pt. states she had "light stroke" in sept. 1980   Urinary incontinence    Past Surgical History:  Procedure Laterality Date   ABDOMINAL HYSTERECTOMY  1970's   complete   ANKLE ARTHROSCOPY Right 07/10/2017   Procedure: ANKLE ARTHROSCOPY;  Surgeon: [name], DPM;  Location: [place];  Service: Podiatry;  Laterality: Right;   BACK SURGERY  07/26/2016   cervical neck surgery, [facility]   BALLOON DILATION N/A 12/01/2021   Procedure: BALLOON DILATION;  Surgeon: Carol Ada, MD;  [facility];  Service: Gastroenterology;  Laterality: N/A;   BLADDER SURGERY  2010   WITH MESH  bladder tach  bunion removal surgery Bilateral 15 yrs ago   CARDIAC CATHETERIZATION Left 09/25/2011   Medical management   CARDIAC CATHETERIZATION Left 03/25/2001   Normal LV function, LAD residual narrowing of less than 10%, normal ramus intermediate, circumflex, and RCA,    CARDIAC CATHETERIZATION  09/04/1999   LAD, 3x35mm Tetra stent resulting in a reduction of the 80% stenosis to 0% residual   CARDIAC CATHETERIZATION N/A 01/26/2015   Procedure: Right/Left Heart Cath and  Coronary Angiography;  Surgeon: Troy Sine, MD;  Location: [place] [facility];  Service: Cardiovascular;  Laterality: N/A;   CARDIAC CATHETERIZATION N/A 01/27/2015   Procedure: Intravascular Pressure Wire/FFR Study;  Surgeon: Burnell Blanks, MD;  Location: [place] [facility];  Service: Cardiovascular;  Laterality: N/A;   CARDIAC CATHETERIZATION N/A 01/27/2015   Procedure: Right Heart Cath;  Surgeon: Burnell Blanks, MD;  Location: [place] [facility];  Service: Cardiovascular;  Laterality: N/A;   CHOLECYSTECTOMY     COLONOSCOPY WITH PROPOFOL N/A 03/07/2017   Procedure: COLONOSCOPY WITH PROPOFOL;  Surgeon: Juanita Craver, MD;  [facility];  Service: Endoscopy;  Laterality: N/A;   CORONARY STENT PLACEMENT     LAD x 1   ESOPHAGOGASTRODUODENOSCOPY (EGD) WITH PROPOFOL N/A 12/01/2021   Procedure: ESOPHAGOGASTRODUODENOSCOPY (EGD) WITH PROPOFOL;  Surgeon: Carol Ada, MD;  [facility];  Service: Gastroenterology;  Laterality: N/A;   ganglion cyst removal  yrs ago   x 2   ILIAC VEIN ANGIOPLASTY / STENTING  02/15/2015   INTRAVASCULAR PRESSURE WIRE/FFR STUDY N/A 04/05/2017   Procedure: Intravascular Pressure Wire/FFR Study;  Surgeon: Martinique, Peter M, MD;  Location: [place] [facility];  Service: Cardiovascular;  Laterality: N/A;   IVC FILTER INSERTION  2016   IVC FILTER REMOVAL  02/15/2015   at [place] CATH AND CORONARY ANGIOGRAPHY N/A 04/05/2017   Procedure: Left Heart Cath and Coronary Angiography;  Surgeon: Martin[place], Peter M, MD;  Location: [place] [facility];  Service: Cardiovascular;  Laterality: N/A;   LEFT HEART CATHETERIZATION WITH CORONARY ANGIOGRAM N/A 09/25/2011   Procedure: LEFT HEART CATHETERIZATION WITH CORONARY ANGIOGRAM;  Surgeon: Sanda Klein, MD;  Location: [place] [facility];  Service: Cardiovascular;  Laterality: N/A;   multiple bladder surgeries to remove mesh     ROTATOR CUFF REPAIR Right    stent to groin Left 08/2014   left leg   TENDON  REPAIR Right 07/10/2017   Procedure: RIGHT PERONEAL TENDON REPAIR;  Surgeon: [name], DPM;  Location: [place];  Service: Podiatry;  Laterality: Right;   TENDON REPAIR Right 05/27/2019   Procedure: PERONEAL TENDON REPAIR x2;  Surgeon: [name], DPM;  [facility];  Service: Podiatry;  Laterality: Right;    Allergies  Allergen Reactions   Atorvastatin Anaphylaxis    Patient does not remember; caused pneumonia- took her off of it per patient.    Penicillins Anaphylaxis, Hives and Swelling    Tongue swelling Did it involve swelling of the face/tongue/throat, SOB, or low BP? Yes Did it involve sudden or severe rash/hives, skin peeling, or any reaction on the inside of your mouth or nose? Yes Did you need to seek medical attention at a hospital or doctor's office? Yes When did it last happen?  89 or 88 year old    If all above answers are "NO", may proceed with cephalosporin use.    Shellfish Allergy Anaphylaxis, Nausea And Vomiting and Other (See Comments)    Severe nausea and vomiting  Aminophylline Itching, Swelling and Rash    Pt experienced burning urination, itching, redness/rash, and swelling of genitals after given this medication via IV push.   Iodinated Contrast Media Swelling and Other (See Comments)    SWELLING REACTION UNSPECIFIED  fFLUSHING   Latex Other (See Comments)    Causes blisters   Oxybutynin Chloride Other (See Comments)    "blisters"   Propoxyphene     Unknown reaction   Vancomycin Hives    06/02/2018- Hives arm, back and chest   Adhesive [Tape] Rash    Paper tape is ok   Betadine [Povidone Iodine] Rash   Ciprofloxacin Hives   Codeine Nausea And Vomiting    .   Ranolazine Other (See Comments)    Constipation  .    Outpatient Encounter Medications as of 12/25/2021  Medication Sig   acetaminophen (TYLENOL) 500 MG tablet Take 500 mg by mouth every 6 (six) hours as needed for moderate pain or headache.   amLODipine (NORVASC) 2.5 MG tablet  Take 1 tablet (2.5 mg total) by mouth daily.   Ascorbic Acid (VITAMIN C) 1000 MG tablet Take 2,000 mg by mouth daily.   BOUDREAUXS BUTT PASTE 40 % ointment Apply 1 application. topically 2 (two) times daily.   cholecalciferol (VITAMIN D3) 25 MCG (1000 UNIT) tablet Take 1,000 Units by mouth daily.    COLLAGEN PO Take 1 Scoop by mouth daily.   Docusate Sodium 100 MG capsule Take 200 mg by mouth daily at 8 pm.   ezetimibe (ZETIA) 10 MG tablet TAKE 1 TABLET(10 MG) BY MOUTH DAILY   fluticasone (FLONASE) 50 MCG/ACT nasal spray Place 1 spray into both nostrils as needed for allergies or rhinitis.   ketoconazole (NIZORAL) 2 % shampoo Apply 1 application. topically daily as needed for irritation.   KLOR-CON M20 20 MEQ tablet Take 1 tablet (20 mEq total) by mouth 3 (three) times daily.   levothyroxine (SYNTHROID) 88 MCG tablet Take 1 tablet (88 mcg total) by mouth daily.   loratadine (CLARITIN) 10 MG tablet Take 10 mg by mouth as needed for allergies.   Multiple Vitamins-Minerals (HAIR/SKIN/NAILS) TABS Take 1 tablet by mouth daily.   omeprazole (PRILOSEC) 40 MG capsule Take 1 capsule (40 mg total) by mouth 2 (two) times daily.   OVER THE COUNTER MEDICATION Take 2 tablets by mouth daily. "Focus factor"   OVER THE COUNTER MEDICATION Take 1 Scoop by mouth daily. Super beet powder   pravastatin (PRAVACHOL) 80 MG tablet Take 80 mg by mouth at bedtime.   torsemide (DEMADEX) 100 MG tablet For a weight of 195 lbs or less, take 50 mg (half a tablet) in the morning and 50 mg in the pm. For a weight of 196 lbs and higher, take 100 mg in the morning and 50 mg in the pm   TURMERIC PO Take 300 mg by mouth daily.   [DISCONTINUED] fluticasone (FLONASE) 50 MCG/ACT nasal spray Place 2 sprays into both nostrils daily. (Patient taking differently: Place 2 sprays into both nostrils daily as needed for allergies.)   [DISCONTINUED] loratadine (CLARITIN) 10 MG tablet Take 1 tablet (10 mg total) by mouth daily for 14 days.  (Patient taking differently: Take 10 mg by mouth daily as needed for allergies.)   No facility-administered encounter medications on file as of 12/25/2021.    Review of Systems  Constitutional:  Negative for appetite change, chills, fatigue, fever and unexpected weight change.  HENT:  Negative for congestion, dental problem, ear discharge, ear pain, facial  swelling, hearing loss, nosebleeds, postnasal drip, rhinorrhea, sinus pressure, sinus pain, sneezing, sore throat, tinnitus and trouble swallowing.   Eyes:  Negative for pain, discharge, redness, itching and visual disturbance.  Respiratory:  Negative for cough, chest tightness, shortness of breath and wheezing.   Cardiovascular:  Negative for chest pain, palpitations and leg swelling.  Gastrointestinal:  Negative for abdominal distention, abdominal pain, blood in stool, constipation, diarrhea, nausea and vomiting.  Endocrine: Negative for cold intolerance, heat intolerance, polydipsia, polyphagia and polyuria.  Genitourinary:  Negative for difficulty urinating, dysuria, flank pain, frequency and urgency.  Musculoskeletal:  Positive for arthralgias, back pain and gait problem. Negative for joint swelling, myalgias, neck pain and neck stiffness.  Skin:  Negative for color change, pallor, rash and wound.  Neurological:  Negative for dizziness, syncope, speech difficulty, weakness, light-headedness, numbness and headaches.  Hematological:  Does not bruise/bleed easily.  Psychiatric/Behavioral:  Negative for agitation, behavioral problems, confusion, hallucinations, self-injury, sleep disturbance and suicidal ideas. The patient is not nervous/anxious.    Immunization History  Administered Date(s) Administered   PFIZER(Purple Top)SARS-COV-2 Vaccination 11/11/2019, 11/13/2019, 12/08/2019   Pneumococcal Conjugate-13 06/14/2014   Pneumococcal Polysaccharide-23 10/26/2013, 03/30/2020   Tdap 12/19/2015   Pertinent  Health Maintenance Due  Topic  Date Due   INFLUENZA VACCINE  04/17/2022   DEXA SCAN  Completed      09/05/2021    8:51 AM 09/22/2021    1:14 PM 10/05/2021   11:16 AM 12/01/2021   10:43 AM 12/25/2021    9:12 AM  Fall Risk  Falls in the past year? 0 0 0  0  Was there an injury with Fall? 0 0 0  0  Fall Risk Category Calculator 0 0 0  0  Fall Risk Category Low Low Low  Low  Patient Fall Risk Level Low fall risk Low fall risk Low fall risk Moderate fall risk Low fall risk  Patient at Risk for Falls Due to No Fall Risks No Fall Risks No Fall Risks  No Fall Risks  Fall risk Follow up Falls evaluation completed Falls evaluation completed Falls evaluation completed  Falls evaluation completed   Functional Status Survey:    Vitals:   12/25/21 0920  BP: 120/70  Pulse: 72  Resp: 16  Temp: 98.6 F (37 C)  SpO2: 98%  Weight: 202 lb (91.6 kg)  Height: 5' 5.5" (1.664 m)   Body mass index is 33.1 kg/m. Physical Exam Vitals reviewed.  Constitutional:      General: She is not in acute distress.    Appearance: Normal appearance. She is obese. She is not ill-appearing or diaphoretic.  HENT:     Head: Normocephalic.     Mouth/Throat:     Mouth: Mucous membranes are moist.     Pharynx: Oropharynx is clear. No oropharyngeal exudate or posterior oropharyngeal erythema.  Eyes:     General: No scleral icterus.       Right eye: No discharge.        Left eye: No discharge.     Conjunctiva/sclera: Conjunctivae normal.     Pupils: Pupils are equal, round, and reactive to light.  Neck:     Vascular: No carotid bruit.  Cardiovascular:     Rate and Rhythm: Normal rate and regular rhythm.     Pulses: Normal pulses.     Heart sounds: Normal heart sounds. No murmur heard.   No friction rub. No gallop.  Pulmonary:     Effort: Pulmonary effort is normal. No respiratory  distress.     Breath sounds: Normal breath sounds. No wheezing, rhonchi or rales.  Chest:     Chest wall: No tenderness.  Abdominal:     General: Bowel sounds  are normal. There is no distension.     Palpations: Abdomen is soft. There is no mass.     Tenderness: There is no abdominal tenderness. There is no right CVA tenderness, left CVA tenderness, guarding or rebound.  Musculoskeletal:        General: No swelling.     Cervical back: Normal range of motion. No rigidity or tenderness.     Right knee: Crepitus present. No swelling, effusion or erythema. Decreased range of motion. Tenderness present.     Left knee: Crepitus present.     Right lower leg: No edema.     Left lower leg: No edema.     Comments: Strength UE and LE  4/5   Lymphadenopathy:     Cervical: No cervical adenopathy.  Skin:    General: Skin is warm and dry.     Coloration: Skin is not pale.     Findings: No bruising, erythema, lesion or rash.  Neurological:     Mental Status: She is alert and oriented to person, place, and time.     Cranial Nerves: No cranial nerve deficit.     Sensory: No sensory deficit.     Motor: No weakness.     Coordination: Coordination normal.     Gait: Gait abnormal.  Psychiatric:        Mood and Affect: Mood normal.        Speech: Speech normal.        Behavior: Behavior normal.        Thought Content: Thought content normal.        Judgment: Judgment normal.    Labs reviewed: Recent Labs    09/05/21 1008 10/16/21 1539 12/13/21 1043  NA 141 143 142  K 4.1 3.6 3.2*  CL 103 101 94*  CO2 32 37* 37*  GLUCOSE 101 66 88  BUN 15 19 15   CREATININE 0.71 0.75 0.83  CALCIUM 9.4 8.9 9.2   Recent Labs    02/27/21 0000 04/14/21 1541  AST 40* 41  ALT 38* 24  ALKPHOS 89 90  BILITOT  --  1.0  PROT  --  6.8  ALBUMIN 3.8 3.6   Recent Labs    06/26/21 0942 09/05/21 1008 10/16/21 1539  WBC 4.0 5.5 8.0  NEUTROABS 2,480 3,119 4,800  HGB 14.8 14.6 15.4  HCT 44.7 43.9 45.2*  MCV 98.5 99.5 98.3  PLT 115* 118* 144   Lab Results  Component Value Date   TSH 3.06 11/02/2021   Lab Results  Component Value Date   HGBA1C 6.1 (H)  01/26/2015   Lab Results  Component Value Date   CHOL 136 09/05/2021   HDL 69 09/05/2021   LDLCALC 51 09/05/2021   TRIG 78 09/05/2021   CHOLHDL 2.0 09/05/2021    Significant Diagnostic Results in last 30 days:  VAS [place] LOWER EXTREMITY VENOUS (DVT)  Result Date: 12/08/2021  Lower Venous DVT Study Patient Name:  NIYOKA SAHLBERG  Date of Exam:   12/08/2021 Medical Rec #: [id number]          Accession #:    [id number] Date of Birth: [birth year]         Patient Gender: F Patient Age:   17 years Exam Location:  [facility] Procedure:  VAS [place] LOWER EXTREMITY VENOUS (DVT) Referring Phys: Melony Overly --------------------------------------------------------------------------------  Indications: Swelling, Pain, and Edema.  Risk Factors: DVT Patient reports a history of DVT. Comparison Study: Prior duplex 09/22/2019 suggested no DVT bilaterally. Performing Technologist: Delorise Shiner RVT  Examination Guidelines: A complete evaluation includes B-mode imaging, spectral Doppler, color Doppler, and power Doppler as needed of all accessible portions of each vessel. Bilateral testing is considered an integral part of a complete examination. Limited examinations for reoccurring indications may be performed as noted. The reflux portion of the exam is performed with the patient in reverse Trendelenburg.  +---------+---------------+---------+-----------+----------+--------------+ RIGHT    CompressibilityPhasicitySpontaneityPropertiesThrombus Aging +---------+---------------+---------+-----------+----------+--------------+ CFV      Full           Yes      Yes                                 +---------+---------------+---------+-----------+----------+--------------+ SFJ      Full                    Yes                                 +---------+---------------+---------+-----------+----------+--------------+ FV Prox  Full           Yes      Yes                                  +---------+---------------+---------+-----------+----------+--------------+ FV Mid   Full           Yes      Yes                                 +---------+---------------+---------+-----------+----------+--------------+ FV DistalFull           Yes      Yes                                 +---------+---------------+---------+-----------+----------+--------------+ PFV      Full                                                        +---------+---------------+---------+-----------+----------+--------------+ POP      Full                    Yes                                 +---------+---------------+---------+-----------+----------+--------------+ PTV      Full                                                        +---------+---------------+---------+-----------+----------+--------------+ GSV      Full  Yes                                 +---------+---------------+---------+-----------+----------+--------------+ SSV      Full                                                        +---------+---------------+---------+-----------+----------+--------------+   +----+---------------+---------+-----------+----------+--------------+ LEFTCompressibilityPhasicitySpontaneityPropertiesThrombus Aging +----+---------------+---------+-----------+----------+--------------+ CFV Full           Yes      Yes                                 +----+---------------+---------+-----------+----------+--------------+   Findings reported to [place] at 16:13.  Summary: RIGHT: - There is no evidence of deep vein thrombosis in the lower extremity. - There is no evidence of superficial venous thrombosis.  - No cystic structure found in the popliteal fossa.  LEFT: - No evidence of common femoral vein obstruction.  *See table(s) above for measurements and observations. Electronically signed by Orlie Pollen on 12/08/2021 at 7:18:56 PM.    Final      Assessment/Plan  1. Unsteady gait She requires DME electric Scooter to allow her to maintain current level of independence with ADL's which cannot be achieved with Rolling walker or cane. Patient suffers from chronic Congestive Heart failure,chronic lower back pain,Osteoarthritis of multiple site and obesity which impairs her ability to perform daily activities like bathing,walking,dressing,grooming and toileting in the home.she is alert and oriented, but able and willing to operate a scooter. - For home use only DME Other see comment: Transport planner   2. Chronic diastolic CHF (congestive heart failure) (HCC) No signs of fluid overload.  Dyspnea on exertion unable to ambulate for long distance.Symptoms worsening will require electric scooter as above. - Continue on current medication -We will monitor weight - For home use only DME Other see comment: Electric scooter  3. Primary osteoarthritis involving multiple joints Pain worst on lower lumbar area and right knee and leg. -Unable to walk long distance she remains high risk for falls.  Will require electric scooter to help her to maintain her current independent level of ADLs. - For home use only DME Other see comment: Therapist, art Communication: Reviewed plan of care with patient verbalized understanding  Labs/tests ordered: None   Next Appointment: As needed if symptoms worsen or fail to improve  Sandrea Hughs, NP

## 2021-12-28 DIAGNOSIS — D696 Thrombocytopenia, unspecified: Secondary | ICD-10-CM | POA: Diagnosis not present

## 2021-12-28 DIAGNOSIS — I8500 Esophageal varices without bleeding: Secondary | ICD-10-CM | POA: Diagnosis not present

## 2021-12-28 DIAGNOSIS — K766 Portal hypertension: Secondary | ICD-10-CM | POA: Diagnosis not present

## 2021-12-28 DIAGNOSIS — Z86010 Personal history of colonic polyps: Secondary | ICD-10-CM | POA: Diagnosis not present

## 2021-12-28 DIAGNOSIS — K7460 Unspecified cirrhosis of liver: Secondary | ICD-10-CM | POA: Diagnosis not present

## 2022-01-01 ENCOUNTER — Telehealth: Payer: Self-pay | Admitting: Cardiovascular Disease

## 2022-01-01 NOTE — Telephone Encounter (Signed)
Returned call to patient no answer.Left message on voice mail to call back. ?

## 2022-01-01 NOTE — Telephone Encounter (Signed)
Pt c/o medication issue: ? ?1. Name of Medication: torsemide (DEMADEX) 100 MG tablet ? ?2. How are you currently taking this medication (dosage and times per day)? For a weight of 195 lbs or less, take 50 mg (half a tablet) in the morning and 50 mg in the pm. For a weight of 196 lbs and higher, take 100 mg in the morning and 50 mg in the pm ? ?3. Are you having a reaction (difficulty breathing--STAT)?  ? ?4. What is your medication issue? Patient wants to know if she needs to come in for blood work today.  She is almost done with the medication.  She also wants to know if he is going to change it to '80mg'$  or keep it at '100mg'$ .   ?

## 2022-01-02 DIAGNOSIS — M48062 Spinal stenosis, lumbar region with neurogenic claudication: Secondary | ICD-10-CM | POA: Diagnosis not present

## 2022-01-03 ENCOUNTER — Telehealth: Payer: Self-pay | Admitting: Cardiovascular Disease

## 2022-01-03 MED ORDER — TORSEMIDE 100 MG PO TABS: ORAL_TABLET | ORAL | 6 refills | Status: DC

## 2022-01-03 NOTE — Telephone Encounter (Signed)
?*  STAT* If patient is at the pharmacy, call can be transferred to refill team. ? ? ?1. Which medications need to be refilled? (please list name of each medication and dose if known) torsemide (DEMADEX) 100 MG tablet ? ?2. Which pharmacy/location (including street and city if local pharmacy) is medication to be sent to? ?[facility] - [place], [place] - [place] [address] ? ?3. Do they need a 30 day or 90 day supply? 30 with refills  ? ?Patient is out of the 100 mg pills. She still has the 80 mg pills. She was not sure if Dr. Loletha Grayer wanted her to get more of the 100 mg pills or not ?

## 2022-01-03 NOTE — Telephone Encounter (Signed)
Pt advised the Torsemide instructions and she  is asking for the 100 mg to be sent in and says she has no trouble cutting them in half.  ?

## 2022-01-11 ENCOUNTER — Telehealth: Payer: Self-pay | Admitting: Cardiovascular Disease

## 2022-01-11 NOTE — Telephone Encounter (Signed)
Pt called back and will come in tom ( 01-12-22 ) for repeat bmet ./cy ?

## 2022-01-11 NOTE — Telephone Encounter (Signed)
Patient states she was returning call. Please advise  

## 2022-01-11 NOTE — Telephone Encounter (Signed)
Lm to call back ./cy 

## 2022-01-11 NOTE — Telephone Encounter (Signed)
See other phone note ./cy 

## 2022-01-12 DIAGNOSIS — I5032 Chronic diastolic (congestive) heart failure: Secondary | ICD-10-CM | POA: Diagnosis not present

## 2022-01-12 LAB — BASIC METABOLIC PANEL
BUN/Creatinine Ratio: 21 (ref 12–28)
BUN: 18 mg/dL (ref 8–27)
CO2: 30 mmol/L — ABNORMAL HIGH (ref 20–29)
Calcium: 9.1 mg/dL (ref 8.7–10.3)
Chloride: 99 mmol/L (ref 96–106)
Creatinine, Ser: 0.87 mg/dL (ref 0.57–1.00)
Glucose: 107 mg/dL — ABNORMAL HIGH (ref 70–99)
Potassium: 4 mmol/L (ref 3.5–5.2)
Sodium: 142 mmol/L (ref 134–144)
eGFR: 65 mL/min/{1.73_m2} (ref >59)

## 2022-01-15 ENCOUNTER — Encounter: Payer: Self-pay | Admitting: *Deleted

## 2022-01-24 DIAGNOSIS — M7061 Trochanteric bursitis, right hip: Secondary | ICD-10-CM | POA: Diagnosis not present

## 2022-01-29 DIAGNOSIS — M47816 Spondylosis without myelopathy or radiculopathy, lumbar region: Secondary | ICD-10-CM | POA: Diagnosis not present

## 2022-01-29 DIAGNOSIS — M533 Sacrococcygeal disorders, not elsewhere classified: Secondary | ICD-10-CM | POA: Diagnosis not present

## 2022-01-30 DIAGNOSIS — M25571 Pain in right ankle and joints of right foot: Secondary | ICD-10-CM | POA: Diagnosis not present

## 2022-01-31 ENCOUNTER — Telehealth: Payer: Self-pay | Admitting: Cardiovascular Disease

## 2022-01-31 ENCOUNTER — Other Ambulatory Visit: Payer: Self-pay | Admitting: Cardiovascular Disease

## 2022-01-31 ENCOUNTER — Other Ambulatory Visit: Payer: Self-pay | Admitting: Family

## 2022-01-31 NOTE — Telephone Encounter (Signed)
Left message-what dose of the torsemide has she been taking?  ?

## 2022-01-31 NOTE — Telephone Encounter (Signed)
.  Pt c/o medication issue: ? ?1. Name of Medication: Torsemide ? ?2. How are you currently taking this medication (dosage and times per day)?  ? ?3. Are you having a reaction (difficulty breathing--STAT)?  ? ?4. What is your medication issue? She wants to know if she needs to continue  taking Torsemide as last instructed? She said doing it this way, she needs more medicine, she ionly have a half tablet left-she said she have been taking 150 mg a day- if so she will need another prescription, ?

## 2022-01-31 NOTE — Telephone Encounter (Signed)
Pt called back, she states that she has been >196 consistently so she has been taking '100mg'$ -am and '50mg'$ -pm. She was afraid that pharmacy would not refill. She will call pharmacy and see if they will refill and call back if she needs [place] to call pharmacy. Will await call back. ?

## 2022-01-31 NOTE — Telephone Encounter (Signed)
Error

## 2022-02-02 DIAGNOSIS — M19071 Primary osteoarthritis, right ankle and foot: Secondary | ICD-10-CM | POA: Diagnosis not present

## 2022-02-08 DIAGNOSIS — K7460 Unspecified cirrhosis of liver: Secondary | ICD-10-CM | POA: Diagnosis not present

## 2022-02-08 DIAGNOSIS — Z8679 Personal history of other diseases of the circulatory system: Secondary | ICD-10-CM | POA: Diagnosis not present

## 2022-02-08 DIAGNOSIS — D692 Other nonthrombocytopenic purpura: Secondary | ICD-10-CM | POA: Diagnosis not present

## 2022-02-08 DIAGNOSIS — E785 Hyperlipidemia, unspecified: Secondary | ICD-10-CM | POA: Diagnosis not present

## 2022-02-08 DIAGNOSIS — I2510 Atherosclerotic heart disease of native coronary artery without angina pectoris: Secondary | ICD-10-CM | POA: Diagnosis not present

## 2022-02-08 DIAGNOSIS — I739 Peripheral vascular disease, unspecified: Secondary | ICD-10-CM | POA: Diagnosis not present

## 2022-02-08 DIAGNOSIS — I110 Hypertensive heart disease with heart failure: Secondary | ICD-10-CM | POA: Diagnosis not present

## 2022-02-08 DIAGNOSIS — Z951 Presence of aortocoronary bypass graft: Secondary | ICD-10-CM | POA: Diagnosis not present

## 2022-02-08 DIAGNOSIS — I5032 Chronic diastolic (congestive) heart failure: Secondary | ICD-10-CM | POA: Diagnosis not present

## 2022-02-08 DIAGNOSIS — Z6832 Body mass index (BMI) 32.0-32.9, adult: Secondary | ICD-10-CM | POA: Diagnosis not present

## 2022-02-08 DIAGNOSIS — Z515 Encounter for palliative care: Secondary | ICD-10-CM | POA: Diagnosis not present

## 2022-02-27 ENCOUNTER — Other Ambulatory Visit: Payer: Self-pay | Admitting: Cardiovascular Disease

## 2022-02-27 ENCOUNTER — Telehealth: Payer: Self-pay | Admitting: Cardiovascular Disease

## 2022-02-27 NOTE — Telephone Encounter (Signed)
Patient is requesting a refill for Imdur but I don't see that Rx. Patient stated that Dr. Loletha Grayer prescribed it. Patient also stated that she got dizzy and fell back in her chair and she still feels as if she is shaky.

## 2022-02-27 NOTE — Telephone Encounter (Signed)
*  STAT* If patient is at the pharmacy, call can be transferred to refill team.   1. Which medications need to be refilled? (please list name of each medication and dose if known) isosorbide 30 mg  2. Which pharmacy/location (including street and city if local pharmacy) is medication to be sent to? [facility] - [place], [place] - [place] [address]  3. Do they need a 30 day or 90 day supply? 90 day   Patient only has 1 tablet left, and takes 4 tablets a day.

## 2022-02-27 NOTE — Telephone Encounter (Signed)
Pt is returning call. Transferred to [name], [place].

## 2022-02-28 ENCOUNTER — Other Ambulatory Visit: Payer: Self-pay

## 2022-02-28 NOTE — Telephone Encounter (Signed)
Patient stated she needs refill on Imdur. She didn't[t know the dosage,etc. I called [facility] [phone] to verify order. I was told patient has been taking Imdur 30 mg two tablets twice daily. The initial order was on 11/11/2020. Also, there is no order for NTG sublingual. These are not on the patient's medication list. Please advise on medication orders.

## 2022-02-28 NOTE — Telephone Encounter (Signed)
LMTCB

## 2022-02-28 NOTE — Telephone Encounter (Signed)
Follow Up:     Patient is returning Sarah's call from today. 

## 2022-03-01 NOTE — Telephone Encounter (Addendum)
Informed patient that Dr. Sallyanne Kuster will review her medications when he returns later this month. She stated, "Well, I hope I make it until then, I have always been on it." Recommended that if patient has chest pain, to take her NTG or call EMS. Spoke with Dr. Harl Bowie (DOD), who recommended patient be seen by APP. Made appointment with Laurann Montana for 03/02/22. Patient advised to bring all medications with her to the appointment. Patient then shared that she got dizzy, passed out, and fell this past Saturday at her granddaughter's graduation. Patient thankful she can be seen tomorrow.

## 2022-03-01 NOTE — Telephone Encounter (Signed)
Thanks for getting her scheduled!  Best,  Loel Dubonnet, NP

## 2022-03-02 ENCOUNTER — Ambulatory Visit (INDEPENDENT_AMBULATORY_CARE_PROVIDER_SITE_OTHER): Payer: HMO | Admitting: Family

## 2022-03-02 ENCOUNTER — Encounter (HOSPITAL_BASED_OUTPATIENT_CLINIC_OR_DEPARTMENT_OTHER): Payer: Self-pay | Admitting: Family

## 2022-03-02 VITALS — BP 110/70 | Ht 65.5 in | Wt 190.0 lb

## 2022-03-02 DIAGNOSIS — I739 Peripheral vascular disease, unspecified: Secondary | ICD-10-CM

## 2022-03-02 DIAGNOSIS — I5032 Chronic diastolic (congestive) heart failure: Secondary | ICD-10-CM

## 2022-03-02 DIAGNOSIS — I25118 Atherosclerotic heart disease of native coronary artery with other forms of angina pectoris: Secondary | ICD-10-CM | POA: Diagnosis not present

## 2022-03-02 DIAGNOSIS — I951 Orthostatic hypotension: Secondary | ICD-10-CM | POA: Diagnosis not present

## 2022-03-02 DIAGNOSIS — Z79899 Other long term (current) drug therapy: Secondary | ICD-10-CM | POA: Diagnosis not present

## 2022-03-02 DIAGNOSIS — E669 Obesity, unspecified: Secondary | ICD-10-CM | POA: Diagnosis not present

## 2022-03-02 DIAGNOSIS — E782 Mixed hyperlipidemia: Secondary | ICD-10-CM

## 2022-03-02 DIAGNOSIS — I10 Essential (primary) hypertension: Secondary | ICD-10-CM

## 2022-03-02 MED ORDER — ISOSORBIDE MONONITRATE ER 30 MG PO TB24: 30.0000 mg | ORAL_TABLET | Freq: Two times a day (BID) | ORAL | 3 refills | Status: DC

## 2022-03-02 NOTE — Patient Instructions (Signed)
Medication Instructions:  Your physician has recommended you make the following change in your medication:   Change: Imdur '30mg'$  twice daily   Please continue taking the medications in the red bag given to you at your appointment today.   *If you need a refill on your cardiac medications before your next appointment, please call your pharmacy*   Lab Work: None ordered today   Testing/Procedures: None ordered today    Follow-Up: At [facility], you and your health needs are our priority.  As part of our continuing mission to provide you with exceptional heart care, we have created designated Provider Care Teams.  These Care Teams include your primary Cardiologist (physician) and Advanced Practice Providers (APPs -  Physician Assistants and Nurse Practitioners) who all work together to provide you with the care you need, when you need it.  We recommend signing up for the patient portal called "MyChart".  Sign up information is provided on this After Visit Summary.  MyChart is used to connect with patients for Virtual Visits (Telemedicine).  Patients are able to view lab/test results, encounter notes, upcoming appointments, etc.  Non-urgent messages can be sent to your provider as well.   To learn more about what you can do with MyChart, go to [email or web address].    Your next appointment:   Follow up as scheduled   Other Instructions Heart Healthy Diet Recommendations: A low-salt diet is recommended. Meats should be grilled, baked, or boiled. Avoid fried foods. Focus on lean protein sources like fish or chicken with vegetables and fruits. The American Heart Association is a Microbiologist!  American Heart Association Diet and Lifeystyle Recommendations   Exercise recommendations: The American Heart Association recommends 150 minutes of moderate intensity exercise weekly. Try 30 minutes of moderate intensity exercise 4-5 times per week. This could include walking, jogging, or  swimming.   Orthostatic Hypotension Blood pressure is a measurement of how strongly, or weakly, your circulating blood is pressing against the walls of your arteries. Orthostatic hypotension is a drop in blood pressure that can happen when you change positions, such as when you go from lying down to standing. Arteries are blood vessels that carry blood from your heart throughout your body. When blood pressure is too low, you may not get enough blood to your brain or to the rest of your organs. Orthostatic hypotension can cause light-headedness, sweating, rapid heartbeat, blurred vision, and fainting. These symptoms require further investigation into the cause. What are the causes? Orthostatic hypotension can be caused by many things, including: Sudden changes in posture, such as standing up quickly after you have been sitting or lying down. Loss of blood (anemia) or loss of body fluids (dehydration). Heart problems, neurologic problems, or hormone problems. Pregnancy. Aging. The risk for this condition increases as you get older. Severe infection (sepsis). Certain medicines, such as medicines for high blood pressure or medicines that make the body lose excess fluids (diuretics). What are the signs or symptoms? Symptoms of this condition may include: Weakness, light-headedness, or dizziness. Sweating. Blurred vision. Tiredness (fatigue). Rapid heartbeat. Fainting, in severe cases. How is this diagnosed? This condition is diagnosed based on: Your symptoms and medical history. Your blood pressure measurements. Your health care provider will check your blood pressure when you are: Lying down. Sitting. Standing. A blood pressure reading is recorded as two numbers, such as "120 over 80" (or 120/80). The first ("top") number is called the systolic pressure. It is a measure of the pressure in your arteries  as your heart beats. The second ("bottom") number is called the diastolic pressure. It is  a measure of the pressure in your arteries when your heart relaxes between beats. Blood pressure is measured in a unit called mmHg. Healthy blood pressure for most adults is 120/80 mmHg. Orthostatic hypotension is defined as a 20 mmHg drop in systolic pressure or a 10 mmHg drop in diastolic pressure within 3 minutes of standing. Other information or tests that may be used to diagnose orthostatic hypotension include: Your other vital signs, such as your heart rate and temperature. Blood tests. An electrocardiogram (ECG) or echocardiogram. A Holter monitor. This is a device you wear that records your heart rhythm continuously, usually for 24-48 hours. Tilt table test. For this test, you will be safely secured to a table that moves you from a lying position to an upright position. Your heart rhythm and blood pressure will be monitored during the test. How is this treated? This condition may be treated by: Changing your diet. This may involve eating more salt (sodium) or drinking more water. Changing the dosage of certain medicines you are taking that might be lowering your blood pressure. Correcting the underlying reason for the orthostatic hypotension. Wearing compression stockings. Taking medicines to raise your blood pressure. Avoiding actions that trigger symptoms. Follow these instructions at home: Medicines Take over-the-counter and prescription medicines only as told by your health care provider. Follow instructions from your health care provider about changing the dosage of your current medicines, if this applies. Do not stop or adjust any of your medicines on your own. Eating and drinking  Drink enough fluid to keep your urine pale yellow. Eat extra salt only as directed. Do not add extra salt to your diet unless advised by your health care provider. Eat frequent, small meals. Avoid standing up suddenly after eating. General instructions  Get up slowly from lying down or sitting  positions. This gives your blood pressure a chance to adjust. Avoid hot showers and excessive heat as directed by your health care provider. Engage in regular physical activity as directed by your health care provider. If you have compression stockings, wear them as told. Keep all follow-up visits. This is important. Contact a health care provider if: You have a fever for more than 2-3 days. You feel more thirsty than usual. You feel dizzy or weak. Get help right away if: You have chest pain. You have a fast or irregular heartbeat. You become sweaty or feel light-headed. You feel short of breath. You faint. You have any symptoms of a stroke. "BE FAST" is an easy way to remember the main warning signs of a stroke: B - Balance. Signs are dizziness, sudden trouble walking, or loss of balance. E - Eyes. Signs are trouble seeing or a sudden change in vision. F - Face. Signs are sudden weakness or numbness of the face, or the face or eyelid drooping on one side. A - Arms. Signs are weakness or numbness in an arm. This happens suddenly and usually on one side of the body. S - Speech. Signs are sudden trouble speaking, slurred speech, or trouble understanding what people say. T - Time. Time to call emergency services. Write down what time symptoms started. You have other signs of a stroke, such as: A sudden, severe headache with no known cause. Nausea or vomiting. Seizure. These symptoms may represent a serious problem that is an emergency. Do not wait to see if the symptoms will go away. Get medical  help right away. Call your local emergency services (911 in the [place].S[place] Do not drive yourself to the hospital. Summary Orthostatic hypotension is a sudden drop in blood pressure. It can cause light-headedness, sweating, rapid heartbeat, blurred vision, and fainting. Orthostatic hypotension can be diagnosed by having your blood pressure taken while lying down, sitting, and then standing. Treatment  may involve changing your diet, wearing compression stockings, sitting up slowly, adjusting your medicines, or correcting the underlying reason for the orthostatic hypotension. Get help right away if you have chest pain, a fast or irregular heartbeat, or symptoms of a stroke. This information is not intended to replace advice given to you by your health care provider. Make sure you discuss any questions you have with your health care provider. Document Revised: 11/17/2020 Document Reviewed: 11/17/2020 Elsevier Patient Education  [place]

## 2022-03-02 NOTE — Progress Notes (Unsigned)
Office Visit    Patient Name: Jill Preston Date of Encounter: 03/04/2022  PCP:  Sandrea Hughs, NP   [place]  Cardiologist:  Sanda Klein, MD  Advanced Practice Provider:  No care team member to display Electrophysiologist:  None      Chief Complaint    Jill Preston is a 88 y.o. female presents today for hypotension   Past Medical History    Past Medical History:  Diagnosis Date   Arthritis    right knee   Cancer ([place]) yrs ago   skin cancer removed from face   Chronic diastolic CHF (congestive heart failure) ([place])    Chronic venous insufficiency    LEA VENOUS, 10/17/2011 - mild reflux in bilateral common femoral veins   CKD (chronic kidney disease), stage III ([place])    Coronary artery disease    a. s/p PCI/BMS to prox LAD and balloon angioplasty to mLAD with suboptimal result in 2000. b. Abnl nuc 2012, cath 09/2011 - showed that the overall territory of potential ischemia was small and attributable to a moderately diseased small second diagonal artery. Med rx.   Dyspnea    with activity   Headache    History of blood transfusion 2017   Hyperlipidemia    Hypertension    Hypertensive heart disease    Hypothyroidism    Obesity    Pneumonia    several times   Stroke [facility])    pt. states she had "light stroke" in sept. 1980   Urinary incontinence    Past Surgical History:  Procedure Laterality Date   ABDOMINAL HYSTERECTOMY  1970's   complete   ANKLE ARTHROSCOPY Right 07/10/2017   Procedure: ANKLE ARTHROSCOPY;  Surgeon: [name], DPM;  Location: [place];  Service: Podiatry;  Laterality: Right;   BACK SURGERY  07/26/2016   cervical neck surgery, [facility]   BALLOON DILATION N/A 12/01/2021   Procedure: BALLOON DILATION;  Surgeon: Carol Ada, MD;  Location: [facility];  Service: Gastroenterology;  Laterality: N/A;   BLADDER SURGERY  2010   WITH MESH  bladder tach   bunion removal surgery Bilateral 15  yrs ago   CARDIAC CATHETERIZATION Left 09/25/2011   Medical management   CARDIAC CATHETERIZATION Left 03/25/2001   Normal LV function, LAD residual narrowing of less than 10%, normal ramus intermediate, circumflex, and RCA,    CARDIAC CATHETERIZATION  09/04/1999   LAD, 3x65m Tetra stent resulting in a reduction of the 80% stenosis to 0% residual   CARDIAC CATHETERIZATION N/A 01/26/2015   Procedure: Right/Left Heart Cath and Coronary Angiography;  Surgeon: TTroy Sine MD;  [facility];  Service: Cardiovascular;  Laterality: N/A;   CARDIAC CATHETERIZATION N/A 01/27/2015   Procedure: Intravascular Pressure Wire/FFR Study;  Surgeon: CBurnell Blanks MD;  [facility];  Service: Cardiovascular;  Laterality: N/A;   CARDIAC CATHETERIZATION N/A 01/27/2015   Procedure: Right Heart Cath;  Surgeon: CBurnell Blanks MD;  [facility];  Service: Cardiovascular;  Laterality: N/A;   CHOLECYSTECTOMY     COLONOSCOPY WITH PROPOFOL N/A 03/07/2017   Procedure: COLONOSCOPY WITH PROPOFOL;  Surgeon: MJuanita Craver MD;  Location: [facility];  Service: Endoscopy;  Laterality: N/A;   CORONARY STENT PLACEMENT     LAD x 1   ESOPHAGOGASTRODUODENOSCOPY (EGD) WITH PROPOFOL N/A 12/01/2021   Procedure: ESOPHAGOGASTRODUODENOSCOPY (EGD) WITH PROPOFOL;  Surgeon: HCarol Ada MD;  Location: [facility];  Service: Gastroenterology;  Laterality: N/A;   ganglion cyst removal  yrs ago   x 2   ILIAC VEIN ANGIOPLASTY / STENTING  02/15/2015   INTRAVASCULAR PRESSURE WIRE/FFR STUDY N/A 04/05/2017   Procedure: Intravascular Pressure Wire/FFR Study;  Surgeon: Martinique, Peter M, MD;  Location: [place] [facility];  Service: Cardiovascular;  Laterality: N/A;   IVC FILTER INSERTION  2016   IVC FILTER REMOVAL  02/15/2015   at [place] CATH AND CORONARY ANGIOGRAPHY N/A 04/05/2017   Procedure: Left Heart Cath and Coronary Angiography;  Surgeon: Martinique, Peter M, MD;  Location:  [place] [facility];  Service: Cardiovascular;  Laterality: N/A;   LEFT HEART CATHETERIZATION WITH CORONARY ANGIOGRAM N/A 09/25/2011   Procedure: LEFT HEART CATHETERIZATION WITH CORONARY ANGIOGRAM;  Surgeon: Sanda Klein, MD;  Location: [place] [facility];  Service: Cardiovascular;  Laterality: N/A;   multiple bladder surgeries to remove mesh     ROTATOR CUFF REPAIR Right    stent to groin Left 08/2014   left leg   TENDON REPAIR Right 07/10/2017   Procedure: RIGHT PERONEAL TENDON REPAIR;  Surgeon: [name], DPM;  Location: [place];  Service: Podiatry;  Laterality: Right;   TENDON REPAIR Right 05/27/2019   Procedure: PERONEAL TENDON REPAIR x2;  Surgeon: [name], DPM;  Location[facility];  Service: Podiatry;  Laterality: Right;    Allergies  Allergies  Allergen Reactions   Atorvastatin Anaphylaxis    Patient does not remember; caused pneumonia- took her off of it per patient.    Penicillins Anaphylaxis, Hives and Swelling    Tongue swelling Did it involve swelling of the face/tongue/throat, SOB, or low BP? Yes Did it involve sudden or severe rash/hives, skin peeling, or any reaction on the inside of your mouth or nose? Yes Did you need to seek medical attention at a hospital or doctor's office? Yes When did it last happen?  41 or 88 year old    If all above answers are "NO", may proceed with cephalosporin use.    Shellfish Allergy Anaphylaxis, Nausea And Vomiting and Other (See Comments)    Severe nausea and vomiting   Aminophylline Itching, Swelling and Rash    Pt experienced burning urination, itching, redness/rash, and swelling of genitals after given this medication via IV push.   Iodinated Contrast Media Swelling and Other (See Comments)    SWELLING REACTION UNSPECIFIED  fFLUSHING   Latex Other (See Comments)    Causes blisters   Oxybutynin Chloride Other (See Comments)    "blisters"   Propoxyphene     Unknown reaction   Vancomycin Hives    06/02/2018- Hives arm, back  and chest   Adhesive [Tape] Rash    Paper tape is ok   Betadine [Povidone Iodine] Rash   Ciprofloxacin Hives   Codeine Nausea And Vomiting    .   Ranolazine Other (See Comments)    Constipation  .    History of Present Illness    Jill Preston is a 88 y.o. female with a hx of CAD, CHF, HTN last seen 12/20/21.  Cardiac cath 2016 with moderate stenosis of the left main, not hemodynamically significant by FFR. Was stable in 2018 by cardiac cath.  Most recent echo 04/22/2020 normal LVEF 60 to 65%, no RWMA, grade 2 diastolic dysfunction, RV normal size and function, LA moderately dilated, trivial MR.  Last seen 12/20/21 by Dr. Sallyanne Kuster noted to be 8 pounds above her dry weight of 190 pounds on her home  scale.  She was prescribed weight-based torsemide: 50 mg once daily if less than 195 pounds, 100 mg in the morning and 50 mg in the afternoon of 5 without weight.  Potassium supplement increased to 20 mill equivalents 3 times per day.  Noted prior AKI on metolazone.  Presents today for follow up. Tells me she fell Saturday as rollator wheels slid out from under her as she went to sit. No near syncope nor syncope.  Reports no shortness of breath nor dyspnea on exertion. Reports no chest pain, pressure, or tightness. No edema, orthopnea, PND. Reports no palpitations.  BP at home 103-134. Notes some lightheadedness with position changes.    EKGs/Labs/Other Studies Reviewed:   The following studies were reviewed today:   CATH 04/05/2017   LM lesion, 70 %stenosed. Mid LAD lesion, 0 %stenosed at site of prior stent. Ost Cx lesion, 60 %stenosed. Prox RCA-1 lesion, 20 %stenosed. Prox RCA-2 lesion, 30 %stenosed. The left ventricular systolic function is normal. LV end diastolic pressure is normal. The left ventricular ejection fraction is 55-65% by visual estimate.   1. 70% eccentric distal left main stenosis. Angiographically similar to 2016. FFR 0.89 into the LAD and 0.86 into the LCx  suggesting that this lesion is not flow limiting.  2. Otherwise nonobstructive CAD. Patent stent in LAD. 3. Normal LV function 4. Normal LVEDP   Plan: recommend continued medical therapy.  EKG:  No EKG today.   Recent Labs: 04/14/2021: ALT 24 10/16/2021: Hemoglobin 15.4; Platelets 144 11/02/2021: TSH 3.06 12/13/2021: BNP 124.0 01/12/2022: BUN 18; Creatinine, Ser 0.87; Potassium 4.0; Sodium 142  Recent Lipid Panel    Component Value Date/Time   CHOL 136 09/05/2021 1008   CHOL 193 11/18/2017 0857   TRIG 78 09/05/2021 1008   HDL 69 09/05/2021 1008   HDL 47 11/18/2017 0857   CHOLHDL 2.0 09/05/2021 1008   VLDL 48 (H) 06/27/2015 1336   LDLCALC 51 09/05/2021 1008      Home Medications   Current Meds  Medication Sig   acetaminophen (TYLENOL) 500 MG tablet Take 500 mg by mouth every 6 (six) hours as needed for moderate pain or headache.   amLODipine (NORVASC) 2.5 MG tablet TAKE ONE TABLET BY MOUTH EVERY MORNING   Apoaequorin (PREVAGEN PO) Take 1 tablet by mouth daily.   cholecalciferol (VITAMIN D3) 25 MCG (1000 UNIT) tablet Take 1,000 Units by mouth daily.    ezetimibe (ZETIA) 10 MG tablet TAKE ONE TABLET BY MOUTH EVERY MORNING   isosorbide mononitrate (IMDUR) 30 MG 24 hr tablet Take 1 tablet (30 mg total) by mouth 2 (two) times daily.   ketoconazole (NIZORAL) 2 % shampoo Apply 1 application. topically daily as needed for irritation.   KLOR-CON M20 20 MEQ tablet Take 1 tablet (20 mEq total) by mouth 3 (three) times daily.   levothyroxine (SYNTHROID) 88 MCG tablet TAKE ONE TABLET BY MOUTH BEFORE BREAKFAST   pravastatin (PRAVACHOL) 80 MG tablet Take 80 mg by mouth at bedtime.   torsemide (DEMADEX) 100 MG tablet For a weight of 195 lbs or less, take 50 mg (half a tablet) in the morning and 50 mg in the pm. For a weight of 196 lbs and higher, take 100 mg in the morning and 50 mg in the pm   [DISCONTINUED] COLLAGEN PO Take 1 Scoop by mouth daily.   [DISCONTINUED] isosorbide dinitrate  (ISORDIL) 30 MG tablet Take 60 mg by mouth 2 (two) times daily.   [DISCONTINUED] Multiple Vitamins-Minerals (HAIR/SKIN/NAILS) TABS Take 1  tablet by mouth daily.   [DISCONTINUED] OVER THE COUNTER MEDICATION Take 1 Scoop by mouth daily. Super beet powder   [DISCONTINUED] TURMERIC PO Take 300 mg by mouth daily.   [DISCONTINUED] UNABLE TO FIND as needed. Med Name: Relaxium Sleep     Review of Systems      All other systems reviewed and are otherwise negative except as noted above.  Physical Exam    VS:  BP 110/70   Ht 5' 5.5" (1.664 m)   Wt 190 lb (86.2 kg)   BMI 31.14 kg/m  , BMI Body mass index is 31.14 kg/m.  Wt Readings from Last 3 Encounters:  03/02/22 190 lb (86.2 kg)  12/25/21 202 lb (91.6 kg)  12/20/21 199 lb (90.3 kg)     GEN: Well nourished, well developed, in no acute distress. HEENT: normal. Neck: Supple, no JVD, carotid bruits, or masses. Cardiac: RRR, no murmurs, rubs, or gallops. No clubbing, cyanosis, edema.  Radials/PT 2+ and equal bilaterally.  Respiratory:  Respirations regular and unlabored, clear to auscultation bilaterally. GI: Soft, nontender, nondistended. MS: No deformity or atrophy. Skin: Warm and dry, no rash. Neuro:  Strength and sensation are intact. Psych: Normal affect.  Assessment & Plan    Medication management / orthostatic hypotension -she brings nearly 20 pill bottles today including prescriptions as well as supplements.  Greater than 30 minutes was spent sorting her medication bottles.  Recommended she avoid purchasing supplements online or via television ads.  She was provided a medication back to hold only her prescription medications.  Due to some orthostatic hpyotension we did reduce her Imdur from 60 mg twice daily to 30 mg twice daily.  CHF - euvolemic on exam.  Weight down 12 pounds compared to clinic visit 2 months ago.  Continue weight-based torsemide dosing. Low sodium diet, fluid restriction <2L, and daily weights encouraged.  Educated to contact our office for weight gain of 2 lbs overnight or 5 lbs in one week.   CAD - Stable with no anginal symptoms. No indication for ischemic evaluation.  GDMT includes pravastatin, zetia, imdur.     Hypertension - BP well controlled. Continue current antihypertensive regimen.    Hyperlipidemia -continue Zetia, pravastatin.  Obesity - Weight loss via diet and exercise encouraged. Discussed the impact being overweight would have on cardiovascular risk.   PAD -known left subclavian stenosis without subclavian steal syndrome or claudication.  Continue Zetia, pravastatin.     Disposition: Follow up  as scheduled  with Sanda Klein, MD   Signed, Loel Dubonnet, NP 03/04/2022, 4:10 PM [facility]

## 2022-03-04 ENCOUNTER — Encounter (HOSPITAL_BASED_OUTPATIENT_CLINIC_OR_DEPARTMENT_OTHER): Payer: Self-pay | Admitting: Family

## 2022-03-06 ENCOUNTER — Encounter: Payer: Self-pay | Admitting: Family

## 2022-03-07 ENCOUNTER — Ambulatory Visit
Admission: RE | Admit: 2022-03-07 | Discharge: 2022-03-07 | Disposition: A | Discharge status: Home / Self Care | Payer: HMO | Source: Ambulatory Visit | Attending: Family | Admitting: Family

## 2022-03-07 ENCOUNTER — Telehealth: Payer: Self-pay | Admitting: Cardiovascular Disease

## 2022-03-07 ENCOUNTER — Encounter: Payer: Self-pay | Admitting: Family

## 2022-03-07 ENCOUNTER — Ambulatory Visit (INDEPENDENT_AMBULATORY_CARE_PROVIDER_SITE_OTHER): Payer: HMO | Admitting: Family

## 2022-03-07 VITALS — BP 160/80 | HR 73 | Temp 97.7°F | Resp 16 | Ht 65.5 in | Wt 188.0 lb

## 2022-03-07 DIAGNOSIS — M2012 Hallux valgus (acquired), left foot: Secondary | ICD-10-CM | POA: Diagnosis not present

## 2022-03-07 DIAGNOSIS — D696 Thrombocytopenia, unspecified: Secondary | ICD-10-CM

## 2022-03-07 DIAGNOSIS — R300 Dysuria: Secondary | ICD-10-CM | POA: Diagnosis not present

## 2022-03-07 DIAGNOSIS — I495 Sick sinus syndrome: Secondary | ICD-10-CM

## 2022-03-07 DIAGNOSIS — R051 Acute cough: Secondary | ICD-10-CM

## 2022-03-07 DIAGNOSIS — R531 Weakness: Secondary | ICD-10-CM | POA: Diagnosis not present

## 2022-03-07 DIAGNOSIS — R2681 Unsteadiness on feet: Secondary | ICD-10-CM

## 2022-03-07 DIAGNOSIS — E039 Hypothyroidism, unspecified: Secondary | ICD-10-CM | POA: Diagnosis not present

## 2022-03-07 DIAGNOSIS — M79672 Pain in left foot: Secondary | ICD-10-CM

## 2022-03-07 DIAGNOSIS — E782 Mixed hyperlipidemia: Secondary | ICD-10-CM | POA: Diagnosis not present

## 2022-03-07 DIAGNOSIS — I739 Peripheral vascular disease, unspecified: Secondary | ICD-10-CM | POA: Diagnosis not present

## 2022-03-07 DIAGNOSIS — M7732 Calcaneal spur, left foot: Secondary | ICD-10-CM | POA: Diagnosis not present

## 2022-03-07 DIAGNOSIS — I110 Hypertensive heart disease with heart failure: Secondary | ICD-10-CM | POA: Diagnosis not present

## 2022-03-07 DIAGNOSIS — S99922A Unspecified injury of left foot, initial encounter: Secondary | ICD-10-CM | POA: Diagnosis not present

## 2022-03-07 DIAGNOSIS — I5032 Chronic diastolic (congestive) heart failure: Secondary | ICD-10-CM

## 2022-03-07 DIAGNOSIS — R296 Repeated falls: Secondary | ICD-10-CM | POA: Diagnosis not present

## 2022-03-07 DIAGNOSIS — R059 Cough, unspecified: Secondary | ICD-10-CM | POA: Diagnosis not present

## 2022-03-07 LAB — POCT URINALYSIS DIPSTICK
Bilirubin, UA: NEGATIVE
Glucose, UA: NEGATIVE
Ketones, UA: 5
Nitrite, UA: POSITIVE
Odor: NORMAL
Protein, UA: POSITIVE — AB
Spec Grav, UA: 1.01 (ref 1.010–1.025)
Urobilinogen, UA: 1 E.U./dL
pH, UA: 7.5 (ref 5.0–8.0)

## 2022-03-07 NOTE — Telephone Encounter (Signed)
  Giving clinical report on patient

## 2022-03-07 NOTE — Patient Instructions (Signed)
-   Please get left foot and chest X-ray at [place] at [facility] then will call you with results.

## 2022-03-07 NOTE — Progress Notes (Signed)
Provider: Marlowe Sax FNP-C   Carnita Golob, [name], NP  Patient Care Team: Nathaneal Sommers, [name], NP as PCP - General (Family Medicine) Croitoru, Dani Gobble, MD as PCP - Cardiology (Cardiology) Sanda Klein, MD as Consulting Physician (Cardiology) Druscilla Brownie, MD as Referring Physician (Dermatology) [name], DPM (Inactive) as Consulting Physician (Podiatry) [name], [name], Weed (Optometry) [name], MD as Referring Physician (Gynecology)  Extended Emergency Contact Information Primary Emergency Contact: Nack,Lewis "Meribeth Mattes [place] Phone: [phone] Mobile Phone: [phone] Relation: Son Secondary Emergency Contact: [facility] Phone: [phone] Mobile Phone: [phone] Relation: Friend  Code Status: Full Code  Goals of care: Advanced Directive information    03/07/2022   10:53 AM  Advanced Directives  Does Patient Have a Medical Advance Directive? Yes  Type of Advance Directive Out of facility DNR (pink MOST or yellow form)  Pre-existing out of facility DNR order (yellow form or pink MOST form) Pink MOST form placed in chart (order not valid for inpatient use)     Chief Complaint  Patient presents with   Medical Management of Chronic Issues    Patient is here for a 24M F/U for chronic conditions discuss the need  for Shingrix and additional Covid boosters; NC[facility] verified    HPI:  Pt is a 88 y.o. female seen today for 84-monthfollow-up for medical management of chronic diseases.    Fell at her granddaughter's graduation.Rolator wheels slid out under her as she went to sit.No syncope.she was seen by cardiologist on 03/02/2022 for hypotension with lightheadedness.Imdur was reduced from 60 mg to 30 mg twice daily.  states tripped on left great toe,2nd and 3rd toe hit her Rolator and fell over the commode two days ago.states toes are painful and bruised. Has upcoming appointment with podiatrist for  evaluation of toes.Has had no imaging.   Has no energy just feels like sleeping. She complains of dysuria.she denies any fever,chills,nausea,vomiting,abdominal pain,flank pain,urgency,frequency,difficult urination or hematuria.  Also complains of cough.described as productive at times with whitish Thick mucus.spat out reddish during the visit though states had something that was red.She denies any fever,chills,fatigue,body aches,runny nose,chest tightness,chest pain,palpitation or shortness of breath.  Past Medical History:  Diagnosis Date   Arthritis    right knee   [place]   skin cancer removed from face   Chronic diastolic CHF (congestive heart failure) (HCC)    Chronic venous insufficiency    LEA VENOUS, 10/17/2011 - mild reflux in bilateral common femoral veins   CKD (chronic kidney disease), stage III [place]    Coronary artery disease    a. s/p PCI/BMS to prox LAD and balloon angioplasty to mLAD with suboptimal result in 2000. b. Abnl nuc 2012, cath 09/2011 - showed that the overall territory of potential ischemia was small and attributable to a moderately diseased small second diagonal artery. Med rx.   Dyspnea    with activity   Headache    History of blood transfusion 2017   Hyperlipidemia    Hypertension    Hypertensive heart disease    Hypothyroidism    Obesity    Pneumonia    several times   Stroke ([facility]    pt. states she had "light stroke" in sept. 1980   Urinary incontinence    Past Surgical History:  Procedure Laterality Date   ABDOMINAL HYSTERECTOMY  1970's   complete   ANKLE ARTHROSCOPY Right 07/10/2017   Procedure:  ANKLE ARTHROSCOPY;  Surgeon: [name], DPM;  Location: [place];  Service: Podiatry;  Laterality: Right;   BACK SURGERY  07/26/2016   cervical neck surgery, [facility]   BALLOON DILATION N/A 12/01/2021   Procedure: BALLOON DILATION;  Surgeon: Carol Ada, MD;  [facility];  Service: Gastroenterology;   Laterality: N/A;   BLADDER SURGERY  2010   WITH MESH  bladder tach   bunion removal surgery Bilateral 15 yrs ago   CARDIAC CATHETERIZATION Left 09/25/2011   Medical management   CARDIAC CATHETERIZATION Left 03/25/2001   Normal LV function, LAD residual narrowing of less than 10%, normal ramus intermediate, circumflex, and RCA,    CARDIAC CATHETERIZATION  09/04/1999   LAD, 3x36m Tetra stent resulting in a reduction of the 80% stenosis to 0% residual   CARDIAC CATHETERIZATION N/A 01/26/2015   Procedure: Right/Left Heart Cath and Coronary Angiography;  Surgeon: TTroy Sine MD;  [place] [facility];  Service: Cardiovascular;  Laterality: N/A;   CARDIAC CATHETERIZATION N/A 01/27/2015   Procedure: Intravascular Pressure Wire/FFR Study;  Surgeon: CBurnell Blanks MD;  [facility];  Service: Cardiovascular;  Laterality: N/A;   CARDIAC CATHETERIZATION N/A 01/27/2015   Procedure: Right Heart Cath;  Surgeon: CBurnell Blanks MD;  [place] [facility];  Service: Cardiovascular;  Laterality: N/A;   CHOLECYSTECTOMY     COLONOSCOPY WITH PROPOFOL N/A 03/07/2017   Procedure: COLONOSCOPY WITH PROPOFOL;  Surgeon: MJuanita Craver MD;  [facility];  Service: Endoscopy;  Laterality: N/A;   CORONARY STENT PLACEMENT     LAD x 1   ESOPHAGOGASTRODUODENOSCOPY (EGD) WITH PROPOFOL N/A 12/01/2021   Procedure: ESOPHAGOGASTRODUODENOSCOPY (EGD) WITH PROPOFOL;  Surgeon: HCarol Ada MD;  [facility];  Service: Gastroenterology;  Laterality: N/A;   ganglion cyst removal  yrs ago   x 2   ILIAC VEIN ANGIOPLASTY / STENTING  02/15/2015   INTRAVASCULAR PRESSURE WIRE/FFR STUDY N/A 04/05/2017   Procedure: Intravascular Pressure Wire/FFR Study;  Surgeon: JMartinique Peter M, MD;  [place] [facility];  Service: Cardiovascular;  Laterality: N/A;   IVC FILTER INSERTION  2016   IVC FILTER [place]OVAL  02/15/2015   [place] [place] [place] [place]/A  04/05/2017   Procedure: Left Heart Cath and Coronary Angiography;  Surgeon: JMartinique Peter M, MD;  [place] [facility];  Service: Cardiovascular;  Laterality: N/A;   LEFT HEART CATHETERIZATION WITH CORONARY ANGIOGRAM N/A 09/25/2011   Procedure: LEFT HEART CATHETERIZATION WITH CORONARY ANGIOGRAM;  Surgeon: MSanda Klein MD;  [facility];  Service: Cardiovascular;  Laterality: N/A;   multiple bladder surgeries to remove mesh     ROTATOR CUFF REPAIR Right    stent to groin Left 08/2014   left leg   TENDON REPAIR Right 07/10/2017   Procedure: RIGHT PERONEAL TENDON REPAIR;  Surgeon: PEvelina Bucy DPM;  [place] [facility]  Service: Podiatry;  Laterality: Right;   TENDON REPAIR Right 05/27/2019   Procedure: PERONEAL TENDON REPAIR x2;  Surgeon: PEvelina Bucy DPM;  [facility];  Service: Podiatry;  Laterality: Right;    Allergies  Allergen Reactions   Atorvastatin Anaphylaxis    Patient does not remember; caused pneumonia- took her off of it per patient.    Penicillins Anaphylaxis, Hives and Swelling    Tongue swelling Did it involve swelling of the face/tongue/throat, SOB, or low BP? Yes Did it involve sudden or severe rash/hives, skin peeling,  or any reaction on the inside of your mouth or nose? Yes Did you need to seek medical attention at a hospital or doctor's office? Yes When did it last happen?  43 or 88 year old    If all above answers are "NO", may proceed with cephalosporin use.    Shellfish Allergy Anaphylaxis, Nausea And Vomiting and Other (See Comments)    Severe nausea and vomiting   Aminophylline Itching, Swelling and Rash    Pt experienced burning urination, itching, redness/rash, and swelling of genitals after given this medication via IV push.   Iodinated Contrast Media Swelling and Other (See Comments)    SWELLING REACTION UNSPECIFIED  fFLUSHING   Latex Other (See Comments)    Causes blisters   Oxybutynin Chloride Other (See Comments)     "blisters"   Propoxyphene     Unknown reaction   Vancomycin Hives    06/02/2018- Hives arm, back and chest   Adhesive [Tape] Rash    Paper tape is ok   Betadine [Povidone Iodine] Rash   Ciprofloxacin Hives   Codeine Nausea And Vomiting    .   Ranolazine Other (See Comments)    Constipation  .    Allergies as of 03/07/2022       Reactions   Atorvastatin Anaphylaxis   Patient does not remember; caused pneumonia- took her off of it per patient.    Penicillins Anaphylaxis, Hives, Swelling   Tongue swelling Did it involve swelling of the face/tongue/throat, SOB, or low BP? Yes Did it involve sudden or severe rash/hives, skin peeling, or any reaction on the inside of your mouth or nose? Yes Did you need to seek medical attention at a hospital or doctor's office? Yes When did it last happen?  11 or 88 year old    If all above answers are "NO", may proceed with cephalosporin use.   Shellfish Allergy Anaphylaxis, Nausea And Vomiting, Other (See Comments)   Severe nausea and vomiting   Aminophylline Itching, Swelling, Rash   Pt experienced burning urination, itching, redness/rash, and swelling of genitals after given this medication via IV push.   Iodinated Contrast Media Swelling, Other (See Comments)   SWELLING REACTION UNSPECIFIED  fFLUSHING   Latex Other (See Comments)   Causes blisters   Oxybutynin Chloride Other (See Comments)   "blisters"   Propoxyphene    Unknown reaction   Vancomycin Hives   06/02/2018- Hives arm, back and chest   Adhesive [tape] Rash   Paper tape is ok   Betadine [povidone Iodine] Rash   Ciprofloxacin Hives   Codeine Nausea And Vomiting   .   Ranolazine Other (See Comments)   Constipation .        Medication List        Accurate as of March 07, 2022  8:58 PM. If you have any questions, ask your nurse or doctor.          acetaminophen 500 MG tablet Commonly known as: TYLENOL Take 500 mg by mouth every 6 (six) hours as needed for  moderate pain or headache.   amLODipine 2.5 MG tablet Commonly known as: NORVASC TAKE ONE TABLET BY MOUTH EVERY MORNING   cholecalciferol 25 MCG (1000 UNIT) tablet Commonly known as: VITAMIN D3 Take 1,000 Units by mouth daily.   ezetimibe 10 MG tablet Commonly known as: ZETIA TAKE ONE TABLET BY MOUTH EVERY MORNING   isosorbide mononitrate 30 MG 24 hr tablet Commonly known as: IMDUR Take 1 tablet (30 mg total)  by mouth 2 (two) times daily.   ketoconazole 2 % shampoo Commonly known as: NIZORAL Apply 1 application. topically daily as needed for irritation.   Klor-Con M20 20 MEQ tablet Generic drug: potassium chloride SA Take 1 tablet (20 mEq total) by mouth 3 (three) times daily.   levothyroxine 88 MCG tablet Commonly known as: SYNTHROID TAKE ONE TABLET BY MOUTH BEFORE BREAKFAST   pravastatin 80 MG tablet Commonly known as: PRAVACHOL Take 80 mg by mouth at bedtime.   PREVAGEN PO Take 1 tablet by mouth daily.   torsemide 100 MG tablet Commonly known as: DEMADEX For a weight of 195 lbs or less, take 50 mg (half a tablet) in the morning and 50 mg in the pm. For a weight of 196 lbs and higher, take 100 mg in the morning and 50 mg in the pm        Review of Systems  Constitutional:  Positive for fatigue. Negative for appetite change, chills, fever and unexpected weight change.  HENT:  Negative for congestion, dental problem, ear discharge, ear pain, facial swelling, hearing loss, nosebleeds, postnasal drip, rhinorrhea, sinus pressure, sinus pain, sneezing, sore throat, tinnitus and trouble swallowing.   Eyes:  Negative for pain, discharge, redness, itching and visual disturbance.  Respiratory:  Negative for cough, chest tightness, shortness of breath and wheezing.   Cardiovascular:  Negative for chest pain, palpitations and leg swelling.  Gastrointestinal:  Negative for abdominal distention, abdominal pain, blood in stool, constipation, diarrhea, nausea and vomiting.   Endocrine: Negative for cold intolerance, heat intolerance, polydipsia, polyphagia and polyuria.  Genitourinary:  Positive for dysuria. Negative for difficulty urinating, flank pain, frequency and urgency.  Musculoskeletal:  Positive for arthralgias and gait problem. Negative for back pain, joint swelling, myalgias, neck pain and neck stiffness.       Left Knee pain Left foot pain   Skin:  Negative for color change, pallor, rash and wound.  Neurological:  Positive for light-headedness. Negative for dizziness, syncope, speech difficulty, weakness, numbness and headaches.  Hematological:  Does not bruise/bleed easily.  Psychiatric/Behavioral:  Negative for agitation, behavioral problems, confusion, hallucinations and sleep disturbance. The patient is not nervous/anxious.     Immunization History  Administered Date(s) Administered   PFIZER(Purple Top)SARS-COV-2 Vaccination 11/11/2019, 11/13/2019, 12/08/2019   Pneumococcal Conjugate-13 06/14/2014   Pneumococcal Polysaccharide-23 10/26/2013, 03/30/2020   Tdap 12/19/2015   Pertinent  Health Maintenance Due  Topic Date Due   INFLUENZA VACCINE  04/17/2022   DEXA SCAN  Completed      09/22/2021    1:14 PM 10/05/2021   11:16 AM 12/01/2021   10:43 AM 12/25/2021    9:12 AM 03/07/2022    9:16 AM  Fall Risk  Falls in the past year? 0 0  0 1  Was there an injury with Fall? 0 0  0 1  Fall Risk Category Calculator 0 0  0 3  Fall Risk Category Low Low  Low High  Patient Fall Risk Level Low fall risk Low fall risk Moderate fall risk Low fall risk High fall risk  Patient at Risk for Falls Due to No Fall Risks No Fall Risks  No Fall Risks History of fall(s);Impaired balance/gait  Fall risk Follow up Falls evaluation completed Falls evaluation completed  Falls evaluation completed Falls evaluation completed   Functional Status Survey:    Vitals:   03/07/22 0917  BP: (!) 160/80  Pulse: 73  Resp: 16  Temp: 97.7 F (36.5 C)  TempSrc: Temporal  SpO2: 94%  Weight: 188 lb (85.3 kg)  Height: 5' 5.5" (1.664 m)   Body mass index is 30.81 kg/m. Physical Exam Vitals reviewed.  Constitutional:      General: She is not in acute distress.    Appearance: Normal appearance. She is normal weight. She is not ill-appearing or diaphoretic.  HENT:     Head: Normocephalic.     Right Ear: Tympanic membrane, ear canal and external ear normal. There is no impacted cerumen.     Left Ear: Tympanic membrane, ear canal and external ear normal. There is no impacted cerumen.     Nose: Nose normal. No congestion or rhinorrhea.     Mouth/Throat:     Mouth: Mucous membranes are moist.     Pharynx: Oropharynx is clear. No oropharyngeal exudate or posterior oropharyngeal erythema.  Eyes:     General: No scleral icterus.       Right eye: No discharge.        Left eye: No discharge.     Extraocular Movements: Extraocular movements intact.     Conjunctiva/sclera: Conjunctivae normal.     Pupils: Pupils are equal, round, and reactive to light.  Neck:     Vascular: No carotid bruit.  Cardiovascular:     Rate and Rhythm: Normal rate and regular rhythm.     Pulses: Normal pulses.     Heart sounds: Normal heart sounds. No murmur heard.    No friction rub. No gallop.  Pulmonary:     Effort: Pulmonary effort is normal. No respiratory distress.     Breath sounds: Examination of the right-lower field reveals decreased breath sounds. Examination of the left-lower field reveals decreased breath sounds. Decreased breath sounds present. No wheezing, rhonchi or rales.  Chest:     Chest wall: No tenderness.  Abdominal:     General: Bowel sounds are normal. There is no distension.     Palpations: Abdomen is soft. There is no mass.     Tenderness: There is no abdominal tenderness. There is no right CVA tenderness, left CVA tenderness, guarding or rebound.  Musculoskeletal:        General: No swelling. Normal range of motion.     Cervical back: Normal range of  motion. No rigidity or tenderness.     Right lower leg: No edema.     Left lower leg: No edema.     Right foot: Normal.     Left foot: Normal capillary refill. Tenderness present. No swelling. Normal pulse.     Comments: 2nd,3rd and 4 th toe Purple bruise on toes and across dorsal foot and tender to touch.No signs of infection noted.   Lymphadenopathy:     Cervical: No cervical adenopathy.  Skin:    General: Skin is warm and dry.     Coloration: Skin is not pale.     Findings: No bruising, erythema, lesion or rash.  Neurological:     Mental Status: She is alert and oriented to person, place, and time.     Cranial Nerves: No cranial nerve deficit.     Sensory: No sensory deficit.     Motor: No weakness.     Coordination: Coordination normal.     Gait: Gait abnormal.  Psychiatric:        Mood and Affect: Mood normal.        Speech: Speech normal.        Behavior: Behavior normal.     Labs reviewed: Recent Labs  10/16/21 1539 12/13/21 1043 01/12/22 0944  NA 143 142 142  K 3.6 3.2* 4.0  CL 101 94* 99  CO2 37* 37* 30*  GLUCOSE 66 88 107*  BUN '19 15 18  ' CREATININE 0.75 0.83 0.87  CALCIUM 8.9 9.2 9.1   Recent Labs    04/14/21 1541  AST 41  ALT 24  ALKPHOS 90  BILITOT 1.0  PROT 6.8  ALBUMIN 3.6   Recent Labs    09/05/21 1008 10/16/21 1539 03/07/22 1014  WBC 5.5 8.0 5.7  NEUTROABS 3,119 4,800 3,642  HGB 14.6 15.4 16.0*  HCT 43.9 45.2* 46.1*  MCV 99.5 98.3 96.2  PLT 118* 144 129*   Lab Results  Component Value Date   TSH 3.06 11/02/2021   Lab Results  Component Value Date   HGBA1C 6.1 (H) 01/26/2015   Lab Results  Component Value Date   CHOL 136 09/05/2021   HDL 69 09/05/2021   LDLCALC 51 09/05/2021   TRIG 78 09/05/2021   CHOLHDL 2.0 09/05/2021    Significant Diagnostic Results in last 30 days:  DG Foot Complete Left  Result Date: 03/07/2022 CLINICAL DATA:  Injury.  Pain. EXAM: LEFT FOOT - COMPLETE 3+ VIEW COMPARISON:  Left ankle radiographs  06/20/2021 FINDINGS: Mild-to-moderate plantar calcaneal heel spur and mild to moderate chronic spurring at the Achilles insertion on the calcaneus, unchanged. Mild-to-moderate dorsal midfoot degenerative osteophytosis, unchanged. Mild hallux valgus. Mild great toe metatarsophalangeal joint space narrowing and peripheral osteophytosis. Mild flattening of the distal second metatarsal head and joint space narrowing and peripheral osteophytosis of the second metatarsophalangeal joint. Moderate third and mild-to-moderate second and fourth tarsometatarsal joint space narrowing. No acute fracture or dislocation. IMPRESSION: 1. Mild-to-moderate plantar calcaneal heel spur. 2. 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 3. Mild hallux valgus and mild great toe metatarsophalangeal osteoarthritis. Electronically Signed   By: Yvonne Kendall M.D.   On: 03/07/2022 17:52   DG Chest 2 View  Result Date: 03/07/2022 CLINICAL DATA:  Coughing off and on since October EXAM: CHEST - 2 VIEW COMPARISON:  11/16/2021 FINDINGS: Normal heart size, mediastinal contours, and pulmonary vascularity. Atherosclerotic calcification aorta. Lungs appear mildly hyperinflated but clear. No acute infiltrate, pleural effusion, or pneumothorax. Bones demineralized with evidence of prior cervical spine fusion and minimal levoconvex thoracic scoliosis. IMPRESSION: No acute abnormalities. Aortic Atherosclerosis (ICD10-I70.0). Electronically Signed   By: Lavonia Dana M.D.   On: 03/07/2022 17:06    Assessment/Plan 1. Hypertensive heart disease with chronic diastolic congestive heart failure (HCC) Blood pressure slightly elevated this visit suspect due to pain on the left toe.  We will continue to monitor for now.  Recent blood pressures have been low with frequent fall episodes.  Imdur was decreased by cardiology as from 56m to 30 mg twice daily. -Continue on Imdur, amlodipine and torsemide -Continue to  monitor blood pressure at home and notify provider if is greater than 150/90 - COMPLETE METABOLIC PANEL WITH GFR - CBC with Differential/Platelet  2. Chronic diastolic CHF (congestive heart failure) (HCC) No signs of fluid overload  - Keep legs elevated when seated to keep swelling down  - check weight at least three times per week and notify provider for any abrupt weight gain > 3 lbs  - Reduce salt intake in diet  - COMPLETE METABOLIC PANEL WITH GFR - CBC with Differential/Platelet  3. PAD (peripheral artery disease) (HCC) No ulceration noted -Continue on statin -Not a candidate for anticoagulant or platelets due to high risk  for falls  4. Acquired hypothyroidism Lab Results  Component Value Date   TSH 3.06 11/02/2021  -Continue on levothyroxine 88 mcg daily on empty stomach no signs of bleeding reported - TSH  5. Thrombocytopenia (HCC) No signs of bleeding reported -No petechiae on exam - - CBC with Differential/Platelet  6. Mixed hyperlipidemia LDL at goal -Continue on pravastatin 80 mg daily - Lipid panel  7. Dysuria Afebrile -We will obtain urine specimen to rule out urinary tract infection - POC Urinalysis Dipstick indicates dark yellow clear urine with trace of ketones, trace of blood, trace protein large leukocytes and positive for nitrites.  We will send urine for urine culture.  Advised to notify provider of running any fever or any chills. - Urine Culture  8. Pain of left foot 2nd,3rd and 4 th toe Purple bruise on toes and across dorsal foot and tender to touch.No signs of infection noted.  -Advised to take over-the-counter Tylenol as needed for pain Will obtain imaging to rule out fracture -Follow-up with podiatrist as ordered - DG Foot Complete Left; Future  9. Acute cough Afebrile -Bilateral lung bases diminished on auscultation.Will obtain imaging to rule out other acute abnormalities - DG Chest 2 View; Future  10. Frequent falls Has had multiple  frequent falls due to generalized weakness.Also possible due to sick sinus syndrome. Will have a work with physical therapy for gait stability, range of motion, exercise and muscle strengthening - Ambulatory referral to [place]  11. Unsteady gait Physical therapy for gait stability - Ambulatory referral to [place]  12. Generalized weakness Unclear etiology suspect possible urinary tract infection we will send urine for culture.  We will also obtain blood work for further evaluation called sick sinus syndrome - Ambulatory referral to [place]  13. Sick sinus syndrome ([place]) Has had multiple fall episode and possible faint, dizziness and lightheadedness. Continue to monitor -Continue to follow-up with a cardiologist  Family/ staff Communication: Reviewed plan of care with patient and husband verbalized understanding Labs/tests ordered:  - DG Chest 2 View; Future - DG Foot Complete Left; Future - Urine Culture - POC Urinalysis Dipstick - CBC with Differential/Platelet - CMP with eGFR([facility]) - TSH  Next Appointment : Return in about 6 months (around 09/06/2022) for medical mangement of chronic issues.Sandrea Hughs, NP

## 2022-03-07 NOTE — Telephone Encounter (Signed)
Spoke with care manager for patients insurance, she called to report the patients blood pressure today sitting was 117/50/56 and standing 94/42/79. She reported slight dizziness with standing. Today she was seen by her pcp today and her bp was 160/80. She was seen 6/16 and medications were adjusted. Spoke with pt, she is having trouble with dizziness with standing. Aware will forward her message to caitlyn as dr c is out.

## 2022-03-08 ENCOUNTER — Emergency Department (HOSPITAL_BASED_OUTPATIENT_CLINIC_OR_DEPARTMENT_OTHER): Payer: HMO | Admitting: Radiology

## 2022-03-08 ENCOUNTER — Encounter (HOSPITAL_BASED_OUTPATIENT_CLINIC_OR_DEPARTMENT_OTHER): Payer: Self-pay | Admitting: Emergency Medicine

## 2022-03-08 ENCOUNTER — Other Ambulatory Visit: Payer: Self-pay

## 2022-03-08 ENCOUNTER — Inpatient Hospital Stay (HOSPITAL_BASED_OUTPATIENT_CLINIC_OR_DEPARTMENT_OTHER)
Admission: EM | Admit: 2022-03-08 | Discharge: 2022-03-10 | DRG: 641 | Disposition: A | Discharge status: Home / Self Care | Payer: HMO | Attending: Family Medicine | Admitting: Family Medicine

## 2022-03-08 DIAGNOSIS — E669 Obesity, unspecified: Secondary | ICD-10-CM | POA: Diagnosis present

## 2022-03-08 DIAGNOSIS — Z881 Allergy status to other antibiotic agents status: Secondary | ICD-10-CM | POA: Diagnosis not present

## 2022-03-08 DIAGNOSIS — N179 Acute kidney failure, unspecified: Secondary | ICD-10-CM | POA: Diagnosis present

## 2022-03-08 DIAGNOSIS — W57XXXA Bitten or stung by nonvenomous insect and other nonvenomous arthropods, initial encounter: Secondary | ICD-10-CM | POA: Diagnosis present

## 2022-03-08 DIAGNOSIS — Z880 Allergy status to penicillin: Secondary | ICD-10-CM

## 2022-03-08 DIAGNOSIS — Z8673 Personal history of transient ischemic attack (TIA), and cerebral infarction without residual deficits: Secondary | ICD-10-CM

## 2022-03-08 DIAGNOSIS — E860 Dehydration: Secondary | ICD-10-CM | POA: Diagnosis present

## 2022-03-08 DIAGNOSIS — N183 Chronic kidney disease, stage 3 unspecified: Secondary | ICD-10-CM | POA: Diagnosis present

## 2022-03-08 DIAGNOSIS — I2510 Atherosclerotic heart disease of native coronary artery without angina pectoris: Secondary | ICD-10-CM | POA: Diagnosis present

## 2022-03-08 DIAGNOSIS — Z885 Allergy status to narcotic agent status: Secondary | ICD-10-CM

## 2022-03-08 DIAGNOSIS — Z955 Presence of coronary angioplasty implant and graft: Secondary | ICD-10-CM

## 2022-03-08 DIAGNOSIS — Z91013 Allergy to seafood: Secondary | ICD-10-CM

## 2022-03-08 DIAGNOSIS — R0602 Shortness of breath: Secondary | ICD-10-CM | POA: Diagnosis not present

## 2022-03-08 DIAGNOSIS — Z91048 Other nonmedicinal substance allergy status: Secondary | ICD-10-CM

## 2022-03-08 DIAGNOSIS — E039 Hypothyroidism, unspecified: Secondary | ICD-10-CM | POA: Diagnosis present

## 2022-03-08 DIAGNOSIS — B9620 Unspecified Escherichia coli [E. coli] as the cause of diseases classified elsewhere: Secondary | ICD-10-CM | POA: Diagnosis present

## 2022-03-08 DIAGNOSIS — M79672 Pain in left foot: Secondary | ICD-10-CM | POA: Diagnosis not present

## 2022-03-08 DIAGNOSIS — I5032 Chronic diastolic (congestive) heart failure: Secondary | ICD-10-CM | POA: Diagnosis present

## 2022-03-08 DIAGNOSIS — I130 Hypertensive heart and chronic kidney disease with heart failure and stage 1 through stage 4 chronic kidney disease, or unspecified chronic kidney disease: Secondary | ICD-10-CM | POA: Diagnosis present

## 2022-03-08 DIAGNOSIS — Z79899 Other long term (current) drug therapy: Secondary | ICD-10-CM

## 2022-03-08 DIAGNOSIS — Z85828 Personal history of other malignant neoplasm of skin: Secondary | ICD-10-CM | POA: Diagnosis not present

## 2022-03-08 DIAGNOSIS — D696 Thrombocytopenia, unspecified: Secondary | ICD-10-CM | POA: Diagnosis present

## 2022-03-08 DIAGNOSIS — Z888 Allergy status to other drugs, medicaments and biological substances status: Secondary | ICD-10-CM | POA: Diagnosis not present

## 2022-03-08 DIAGNOSIS — E785 Hyperlipidemia, unspecified: Secondary | ICD-10-CM | POA: Diagnosis present

## 2022-03-08 DIAGNOSIS — N390 Urinary tract infection, site not specified: Secondary | ICD-10-CM | POA: Diagnosis present

## 2022-03-08 DIAGNOSIS — S30861A Insect bite (nonvenomous) of abdominal wall, initial encounter: Secondary | ICD-10-CM | POA: Diagnosis present

## 2022-03-08 DIAGNOSIS — Z79890 Hormone replacement therapy (postmenopausal): Secondary | ICD-10-CM

## 2022-03-08 DIAGNOSIS — T502X5A Adverse effect of carbonic-anhydrase inhibitors, benzothiadiazides and other diuretics, initial encounter: Secondary | ICD-10-CM | POA: Diagnosis present

## 2022-03-08 DIAGNOSIS — M1712 Unilateral primary osteoarthritis, left knee: Secondary | ICD-10-CM | POA: Diagnosis not present

## 2022-03-08 DIAGNOSIS — Z8249 Family history of ischemic heart disease and other diseases of the circulatory system: Secondary | ICD-10-CM

## 2022-03-08 DIAGNOSIS — E876 Hypokalemia: Secondary | ICD-10-CM | POA: Diagnosis not present

## 2022-03-08 LAB — TROPONIN I (HIGH SENSITIVITY)
Troponin I (High Sensitivity): 17 ng/L (ref <18)
Troponin I (High Sensitivity): 16 ng/L (ref <18)

## 2022-03-08 LAB — MAGNESIUM: Magnesium: 2.1 mg/dL (ref 1.7–2.4)

## 2022-03-08 LAB — COMPREHENSIVE METABOLIC PANEL
ALT: 32 U/L (ref 0–44)
AST: 51 U/L — ABNORMAL HIGH (ref 15–41)
Albumin: 4.1 g/dL (ref 3.5–5.0)
Alkaline Phosphatase: 72 U/L (ref 38–126)
Anion gap: 11 (ref 5–15)
BUN: 40 mg/dL — ABNORMAL HIGH (ref 8–23)
CO2: 37 mmol/L — ABNORMAL HIGH (ref 22–32)
Calcium: 10.4 mg/dL — ABNORMAL HIGH (ref 8.9–10.3)
Chloride: 89 mmol/L — ABNORMAL LOW (ref 98–111)
Creatinine, Ser: 1.17 mg/dL — ABNORMAL HIGH (ref 0.44–1.00)
GFR, Estimated: 45 mL/min — ABNORMAL LOW (ref >60)
Glucose, Bld: 111 mg/dL — ABNORMAL HIGH (ref 70–99)
Potassium: 2.2 mmol/L — CL (ref 3.5–5.1)
Sodium: 137 mmol/L (ref 135–145)
Total Bilirubin: 1.5 mg/dL — ABNORMAL HIGH (ref 0.3–1.2)
Total Protein: 7.2 g/dL (ref 6.5–8.1)

## 2022-03-08 LAB — CBC WITH DIFFERENTIAL/PLATELET
Abs Immature Granulocytes: 0.01 10*3/uL (ref 0.00–0.07)
Basophils Absolute: 0 10*3/uL (ref 0.0–0.1)
Basophils Relative: 1 %
Eosinophils Absolute: 0.3 10*3/uL (ref 0.0–0.5)
Eosinophils Relative: 6 %
HCT: 47 % — ABNORMAL HIGH (ref 36.0–46.0)
Hemoglobin: 16.1 g/dL — ABNORMAL HIGH (ref 12.0–15.0)
Immature Granulocytes: 0 %
Lymphocytes Relative: 18 %
Lymphs Abs: 1 10*3/uL (ref 0.7–4.0)
MCH: 32.9 pg (ref 26.0–34.0)
MCHC: 34.3 g/dL (ref 30.0–36.0)
MCV: 96.1 fL (ref 80.0–100.0)
Monocytes Absolute: 0.6 10*3/uL (ref 0.1–1.0)
Monocytes Relative: 10 %
Neutro Abs: 3.7 10*3/uL (ref 1.7–7.7)
Neutrophils Relative %: 65 %
Platelets: 113 10*3/uL — ABNORMAL LOW (ref 150–400)
RBC: 4.89 MIL/uL (ref 3.87–5.11)
RDW: 14.2 % (ref 11.5–15.5)
Smear Review: NORMAL
WBC: 5.7 10*3/uL (ref 4.0–10.5)
nRBC: 0 % (ref 0.0–0.2)

## 2022-03-08 LAB — BRAIN NATRIURETIC PEPTIDE: B Natriuretic Peptide: 204.5 pg/mL — ABNORMAL HIGH (ref 0.0–100.0)

## 2022-03-08 MED ORDER — LEVOTHYROXINE SODIUM 88 MCG PO TABS
88.0000 ug | ORAL_TABLET | Freq: Every day | ORAL | Status: DC
  Administered 2022-03-09 – 2022-03-10 (×2): 88 ug via ORAL
  Filled 2022-03-08 (×2): qty 1

## 2022-03-08 MED ORDER — ISOSORBIDE MONONITRATE ER 30 MG PO TB24
30.0000 mg | ORAL_TABLET | Freq: Two times a day (BID) | ORAL | Status: DC
  Administered 2022-03-08 – 2022-03-10 (×4): 30 mg via ORAL
  Filled 2022-03-08 (×4): qty 1

## 2022-03-08 MED ORDER — POTASSIUM CHLORIDE 10 MEQ/100ML IV SOLN
10.0000 meq | INTRAVENOUS | Status: AC
  Administered 2022-03-08 (×5): 10 meq via INTRAVENOUS
  Filled 2022-03-08 (×5): qty 100

## 2022-03-08 MED ORDER — POTASSIUM CHLORIDE CRYS ER 20 MEQ PO TBCR
40.0000 meq | EXTENDED_RELEASE_TABLET | Freq: Once | ORAL | Status: AC
  Administered 2022-03-08: 40 meq via ORAL
  Filled 2022-03-08: qty 2

## 2022-03-08 MED ORDER — ENOXAPARIN SODIUM 40 MG/0.4ML IJ SOSY
40.0000 mg | PREFILLED_SYRINGE | Freq: Every day | INTRAMUSCULAR | Status: DC
  Administered 2022-03-08 – 2022-03-09 (×2): 40 mg via SUBCUTANEOUS
  Filled 2022-03-08 (×2): qty 0.4

## 2022-03-08 MED ORDER — AMLODIPINE BESYLATE 5 MG PO TABS
2.5000 mg | ORAL_TABLET | Freq: Every day | ORAL | Status: DC
  Administered 2022-03-08 – 2022-03-10 (×3): 2.5 mg via ORAL
  Filled 2022-03-08 (×3): qty 1

## 2022-03-08 MED ORDER — PRAVASTATIN SODIUM 40 MG PO TABS
80.0000 mg | ORAL_TABLET | Freq: Every day | ORAL | Status: DC
  Administered 2022-03-08 – 2022-03-09 (×2): 80 mg via ORAL
  Filled 2022-03-08 (×2): qty 2

## 2022-03-08 MED ORDER — EZETIMIBE 10 MG PO TABS
10.0000 mg | ORAL_TABLET | Freq: Every day | ORAL | Status: DC
  Administered 2022-03-09 – 2022-03-10 (×2): 10 mg via ORAL
  Filled 2022-03-08 (×2): qty 1

## 2022-03-08 MED ORDER — SODIUM CHLORIDE 0.9 % IV SOLN: INTRAVENOUS | Status: AC

## 2022-03-08 NOTE — Telephone Encounter (Signed)
Left message for pt to call.

## 2022-03-08 NOTE — Telephone Encounter (Signed)
Recommend orthostatic precautions (stay well hydrated, make position changes slowly, wear compression socks).   So long as she is not having chest discomfort can reduce Imdur to '15mg'$  daily.  Please ensure sitting and resting 5-10 minutes prior to checking BP. Suspect if they would have rechecked her BP at PCP office it would have been lower.   Loel Dubonnet, NP

## 2022-03-08 NOTE — H&P (Signed)
History and Physical    KEONTAE CH [id number] DOB: 19-Jul-1933 DOA: 03/08/2022  PCP: Sandrea Hughs, NP  Patient coming from: Home  Chief Complaint: Hypokalemia  HPI: Jill Preston is a 88 y.o. female with medical history significant of chronic diastolic CHF, CKD stage III, CAD status post PCI, PAD, DVT not on anticoagulation, chronic thrombocytopenia, obesity (BMI 30.81), hyperlipidemia, hypertension, hypothyroidism, stroke, sick sinus syndrome.  Patient was recently seen by cardiology on 6/16 and dose of Imdur reduced from 60 mg twice daily to 30 mg twice daily due to orthostatic hypotension.  Patient presented to the ED today due to concern for hypokalemia found on outpatient labs.  Reported chronic dyspnea on exertion and 2 episodes of an unusual sensation in her chest with exertion that is difficult to define as chest pain or palpitations.  In the ED, vital signs stable.  Labs notable for WBC 5.7, hemoglobin 16.1, platelet count 113k.  Sodium 137, potassium 2.2, chloride 89, bicarb 37 (chronically elevated), BUN 40, creatinine 1.1 (baseline 0.7-0.8), glucose 111.  Calcium 10.4, albumin 4.1.  AST 51, T. bili 1.5.  ALT and alkaline phosphatase normal.  BNP 204.  High-sensitivity troponin negative x2.  Magnesium 2.1.  Chest x-ray negative for acute finding. Patient was given oral potassium 40 mEq and IV potassium 10 mEq x 5.  Patient is complaining of generalized weakness.  Reports chronic cough and chronic dyspnea on exertion.  She reports 2 recent episodes of "bubbling" sensation in her chest which she describes as neither chest pain nor palpitations.  She takes torsemide 50 mg twice daily normally but if her weight is above 195 pounds then she takes torsemide 100 mg in the morning and 50 mg at night.  She also takes potassium 20 mEq 3 times a day.  Denies vomiting or diarrhea.  She is eating 3 meals a day.  Denies fevers.  Review of Systems:  Review of Systems  All other systems  reviewed and are negative.   Past Medical History:  Diagnosis Date   Arthritis    right knee   Cancer ([place]) yrs ago   skin cancer removed from face   Chronic diastolic CHF (congestive heart failure) (HCC)    Chronic venous insufficiency    LEA VENOUS, 10/17/2011 - mild reflux in bilateral common femoral veins   CKD (chronic kidney disease), stage III ([place])    Coronary artery disease    a. s/p PCI/BMS to prox LAD and balloon angioplasty to mLAD with suboptimal result in 2000. b. Abnl nuc 2012, cath 09/2011 - showed that the overall territory of potential ischemia was small and attributable to a moderately diseased small second diagonal artery. Med rx.   Dyspnea    with activity   Headache    History of blood transfusion 2017   Hyperlipidemia    Hypertension    Hypertensive heart disease    Hypothyroidism    Obesity    Pneumonia    several times   Stroke [facility])    pt. states she had "light stroke" in sept. 1980   Urinary incontinence     Past Surgical History:  Procedure Laterality Date   ABDOMINAL HYSTERECTOMY  1970's   complete   ANKLE ARTHROSCOPY Right 07/10/2017   Procedure: ANKLE ARTHROSCOPY;  Surgeon: [name], DPM;  Location: [place];  Service: Podiatry;  Laterality: Right;   BACK SURGERY  07/26/2016   cervical neck surgery, [place] [facility]   BALLOON DILATION N/A 12/01/2021  Procedure: BALLOON DILATION;  Surgeon: Carol Ada, MD;  Location: [facility];  Service: Gastroenterology;  Laterality: N/A;   BLADDER SURGERY  2010   WITH MESH  bladder tach   bunion removal surgery Bilateral 15 yrs ago   CARDIAC CATHETERIZATION Left 09/25/2011   Medical management   CARDIAC CATHETERIZATION Left 03/25/2001   Normal LV function, LAD residual narrowing of less than 10%, normal ramus intermediate, circumflex, and RCA,    CARDIAC CATHETERIZATION  09/04/1999   LAD, 3x16mm Tetra stent resulting in a reduction of the 80% stenosis to 0% residual   CARDIAC  CATHETERIZATION N/A 01/26/2015   Procedure: Right/Left Heart Cath and Coronary Angiography;  Surgeon: Troy Sine, MD;  Location: [place] [facility];  Service: Cardiovascular;  Laterality: N/A;   CARDIAC CATHETERIZATION N/A 01/27/2015   Procedure: Intravascular Pressure Wire/FFR Study;  Surgeon: Burnell Blanks, MD;  Location: [place] [facility];  Service: Cardiovascular;  Laterality: N/A;   CARDIAC CATHETERIZATION N/A 01/27/2015   Procedure: Right Heart Cath;  Surgeon: Burnell Blanks, MD;  Location: [place] [facility];  Service: Cardiovascular;  Laterality: N/A;   CHOLECYSTECTOMY     COLONOSCOPY WITH PROPOFOL N/A 03/07/2017   Procedure: COLONOSCOPY WITH PROPOFOL;  Surgeon: Juanita Craver, MD;  Location: [facility];  Service: Endoscopy;  Laterality: N/A;   CORONARY STENT PLACEMENT     LAD x 1   ESOPHAGOGASTRODUODENOSCOPY (EGD) WITH PROPOFOL N/A 12/01/2021   Procedure: ESOPHAGOGASTRODUODENOSCOPY (EGD) WITH PROPOFOL;  Surgeon: Carol Ada, MD;  Location: [facility];  Service: Gastroenterology;  Laterality: N/A;   ganglion cyst removal  yrs ago   x 2   ILIAC VEIN ANGIOPLASTY / STENTING  02/15/2015   INTRAVASCULAR PRESSURE WIRE/FFR STUDY N/A 04/05/2017   Procedure: Intravascular Pressure Wire/FFR Study;  Surgeon: Martinique, Peter M, MD;  Location: [place] [facility];  Service: Cardiovascular;  Laterality: N/A;   IVC FILTER INSERTION  2016   IVC FILTER REMOVAL  02/15/2015   at [place] CATH AND CORONARY ANGIOGRAPHY N/A 04/05/2017   Procedure: Left Heart Cath and Coronary Angiography;  Surgeon: Martinique, Peter M, MD;  Location: [place] [facility];  Service: Cardiovascular;  Laterality: N/A;   LEFT HEART CATHETERIZATION WITH CORONARY ANGIOGRAM N/A 09/25/2011   Procedure: LEFT HEART CATHETERIZATION WITH CORONARY ANGIOGRAM;  Surgeon: Sanda Klein, MD;  Location: [place] [facility];  Service: Cardiovascular;  Laterality: N/A;   multiple bladder surgeries to remove mesh     ROTATOR CUFF  REPAIR Right    stent to groin Left 08/2014   left leg   TENDON REPAIR Right 07/10/2017   Procedure: RIGHT PERONEAL TENDON REPAIR;  Surgeon: [name], DPM;  Location: [place];  Service: Podiatry;  Laterality: Right;   TENDON REPAIR Right 05/27/2019   Procedure: PERONEAL TENDON REPAIR x2;  Surgeon: [name], DPM;  Location: [facility];  Service: Podiatry;  Laterality: Right;     reports that she has never smoked. She has never used smokeless tobacco. She reports that she does not drink alcohol and does not use drugs.  Allergies  Allergen Reactions   Atorvastatin Anaphylaxis and Other (See Comments)    Patient does not remember; caused pneumonia- took her off of it per patient   Penicillins Anaphylaxis, Hives, Swelling and Other (See Comments)    Tongue swelling Did it involve swelling of the face/tongue/throat, SOB, or low BP? Yes Did it involve sudden or severe rash/hives, skin peeling, or any reaction on the inside of  your mouth or nose? Yes Did you need to seek medical attention at a hospital or doctor's office? Yes When did it last happen?  39 or 88 years old    If all above answers are "NO", may proceed with cephalosporin use.    Shellfish Allergy Anaphylaxis, Nausea And Vomiting and Other (See Comments)    Severe nausea and vomiting   Aminophylline Itching, Swelling, Rash and Other (See Comments)    Pt experienced burning urination, itching, redness/rash, and swelling of genitals after given this medication via IV push.   Iodinated Contrast Media Swelling and Other (See Comments)    FLUSHING, also; LLE became swollen   Latex Other (See Comments)    Causes blisters   Oxybutynin Chloride Other (See Comments)    "blisters"   Propoxyphene Other (See Comments)    Unknown reaction   Vancomycin Hives and Other (See Comments)    06/02/2018- Hives arm, back and chest   Adhesive [Tape] Rash and Other (See Comments)    Paper tape is ok   Betadine [Povidone Iodine] Rash    Ciprofloxacin Hives   Codeine Nausea And Vomiting    .   Ranolazine Other (See Comments)    Constipation  .    Family History  Problem Relation Age of Onset   Cancer Mother    Heart disease Father    Heart disease Sister    High blood pressure Sister    Heart disease Sister    Cancer Sister    Heart disease Brother    Diabetes Brother    Brain cancer Brother    High blood pressure Brother    Heart disease Brother    Heart attack Brother    Heart disease Brother    High blood pressure Brother     Prior to Admission medications   Medication Sig Start Date End Date Taking? Authorizing Provider  amLODipine (NORVASC) 2.5 MG tablet TAKE ONE TABLET BY MOUTH EVERY MORNING Patient taking differently: Take 2.5 mg by mouth in the morning. 02/01/22  Yes Croitoru, Mihai, MD  cholecalciferol (VITAMIN D3) 25 MCG (1000 UNIT) tablet Take 1,000 Units by mouth daily.    Yes [provider]  ezetimibe (ZETIA) 10 MG tablet TAKE ONE TABLET BY MOUTH EVERY MORNING Patient taking differently: Take 10 mg by mouth daily. 02/01/22  Yes Croitoru, Mihai, MD  isosorbide mononitrate (IMDUR) 30 MG 24 hr tablet Take 1 tablet (30 mg total) by mouth 2 (two) times daily. 03/02/22 02/25/23 Yes Loel Dubonnet, NP  ketoconazole (NIZORAL) 2 % shampoo Apply 1 application  topically daily as needed for irritation (or itching- SHAMPOO). 06/27/21  Yes [provider]  KLOR-CON M20 20 MEQ tablet Take 1 tablet (20 mEq total) by mouth 3 (three) times daily. 12/20/21  Yes Croitoru, Mihai, MD  levothyroxine (SYNTHROID) 88 MCG tablet TAKE ONE TABLET BY MOUTH BEFORE BREAKFAST Patient taking differently: Take 88 mcg by mouth daily before breakfast. 01/31/22  Yes [name], [name], NP  pravastatin (PRAVACHOL) 80 MG tablet Take 80 mg by mouth at bedtime.   Yes [provider]  torsemide (DEMADEX) 100 MG tablet For a weight of 195 lbs or less, take 50 mg (half a tablet) in the morning and 50 mg in the pm. For  a weight of 196 lbs and higher, take 100 mg in the morning and 50 mg in the pm Patient taking differently: Take 50-100 mg by mouth See admin instructions. For a weight of 195 lbs or less,  take 50 mg by mouth in the morning and 50 mg in the evening. For a weight of 196 lbs and higher, take 100 mg by mouth in the morning and 50 mg in the evening. 01/03/22  Yes Croitoru, Mihai, MD  TYLENOL 8 HOUR ARTHRITIS PAIN 650 MG CR tablet Take 650 mg by mouth every 8 (eight) hours as needed for pain.   Yes [provider]  vitamin C (ASCORBIC ACID) 500 MG tablet Take 500 mg by mouth daily.   Yes [provider]    Physical Exam: Vitals:   03/08/22 1700 03/08/22 1730 03/08/22 1737 03/08/22 1844  BP: 137/67 (!) 143/72  (!) 131/56  Pulse: 67 71  64  Resp: 13 19  20   Temp:   98.6 F (37 C) 98.4 F (36.9 C)  TempSrc:   Oral Oral  SpO2: 96% 95%  97%  Weight:      Height:        Physical Exam Vitals reviewed.  Constitutional:      General: She is not in acute distress. HENT:     Head: Normocephalic and atraumatic.  Eyes:     Extraocular Movements: Extraocular movements intact.  Cardiovascular:     Rate and Rhythm: Normal rate and regular rhythm.     Pulses: Normal pulses.  Pulmonary:     Effort: Pulmonary effort is normal. No respiratory distress.     Breath sounds: Normal breath sounds. No wheezing or rales.  Abdominal:     General: Bowel sounds are normal. There is no distension.     Palpations: Abdomen is soft.     Tenderness: There is no abdominal tenderness.  Musculoskeletal:        General: No swelling or tenderness.     Cervical back: Normal range of motion.  Skin:    General: Skin is warm and dry.  Neurological:     General: No focal deficit present.     Mental Status: She is alert and oriented to person, place, and time.      Labs on Admission: I have personally reviewed following labs and imaging studies  CBC: Recent Labs  Lab 03/07/22 1014 03/08/22 1416   WBC 5.7 5.7  NEUTROABS 3,642 3.7  HGB 16.0* 16.1*  HCT 46.1* 47.0*  MCV 96.2 96.1  PLT 129* 123456*   Basic Metabolic Panel: Recent Labs  Lab 03/07/22 1014 03/08/22 1416  NA 140 137  K 2.5* 2.2*  CL 90* 89*  CO2 35* 37*  GLUCOSE 100* 111*  BUN 43* 40*  CREATININE 1.15* 1.17*  CALCIUM 9.9 10.4*  MG  --  2.1   GFR: Estimated Creatinine Clearance: 37.6 mL/min (A) (by C-G formula based on SCr of 1.17 mg/dL (H)). Liver Function Tests: Recent Labs  Lab 03/07/22 1014 03/08/22 1416  AST 53* 51*  ALT 28 32  ALKPHOS  --  72  BILITOT 1.7* 1.5*  PROT 6.5 7.2  ALBUMIN  --  4.1   No results for input(s): "LIPASE", "AMYLASE" in the last 168 hours. No results for input(s): "AMMONIA" in the last 168 hours. Coagulation Profile: No results for input(s): "INR", "PROTIME" in the last 168 hours. Cardiac Enzymes: No results for input(s): "CKTOTAL", "CKMB", "CKMBINDEX", "TROPONINI" in the last 168 hours. BNP (last 3 results) No results for input(s): "PROBNP" in the last 8760 hours. HbA1C: No results for input(s): "HGBA1C" in the last 72 hours. CBG: No results for input(s): "GLUCAP" in the last 168 hours. Lipid Profile: Recent Labs  03/07/22 1014  CHOL 139  HDL 71  LDLCALC 52  TRIG 82  CHOLHDL 2.0   Thyroid Function Tests: Recent Labs    03/07/22 1014  TSH 1.02   Anemia Panel: No results for input(s): "VITAMINB12", "FOLATE", "FERRITIN", "TIBC", "IRON", "RETICCTPCT" in the last 72 hours. Urine analysis:    Component Value Date/Time   COLORURINE STRAW (A) 10/22/2019 1441   APPEARANCEUR CLEAR 10/22/2019 1441   LABSPEC 1.004 (L) 10/22/2019 1441   PHURINE 8.0 10/22/2019 1441   GLUCOSEU NEGATIVE 10/22/2019 1441   HGBUR NEGATIVE 10/22/2019 1441   BILIRUBINUR Negative 03/07/2022 1023   KETONESUR NEGATIVE 10/22/2019 1441   PROTEINUR Positive (A) 03/07/2022 1023   PROTEINUR NEGATIVE 10/22/2019 1441   UROBILINOGEN 1.0 03/07/2022 1023   UROBILINOGEN 0.2 07/21/2011 0120    NITRITE positive 03/07/2022 1023   NITRITE NEGATIVE 10/22/2019 1441   LEUKOCYTESUR Large (3+) (A) 03/07/2022 1023   LEUKOCYTESUR NEGATIVE 10/22/2019 1441    Radiological Exams on Admission: I have personally reviewed images DG Chest 2 View  Result Date: 03/08/2022 CLINICAL DATA:  Shortness of breath EXAM: CHEST - 2 VIEW COMPARISON:  Chest x-ray PA and lateral dated March 07, 2022 FINDINGS: The heart size and mediastinal contours are within normal limits. Both lungs are clear. The visualized skeletal structures are unremarkable. IMPRESSION: No active cardiopulmonary disease. Electronically Signed   By: Yetta Glassman M.D.   On: 03/08/2022 14:06   DG Foot Complete Left  Result Date: 03/07/2022 CLINICAL DATA:  Injury.  Pain. EXAM: LEFT FOOT - COMPLETE 3+ VIEW COMPARISON:  Left ankle radiographs 06/20/2021 FINDINGS: Mild-to-moderate plantar calcaneal heel spur and mild to moderate chronic spurring at the Achilles insertion on the calcaneus, unchanged. Mild-to-moderate dorsal midfoot degenerative osteophytosis, unchanged. Mild hallux valgus. Mild great toe metatarsophalangeal joint space narrowing and peripheral osteophytosis. Mild flattening of the distal second metatarsal head and joint space narrowing and peripheral osteophytosis of the second metatarsophalangeal joint. Moderate third and mild-to-moderate second and fourth tarsometatarsal joint space narrowing. No acute fracture or dislocation. IMPRESSION: 1. Mild-to-moderate plantar calcaneal heel spur. 2. 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 3. Mild hallux valgus and mild great toe metatarsophalangeal osteoarthritis. Electronically Signed   By: Yvonne Kendall M.D.   On: 03/07/2022 17:52   DG Chest 2 View  Result Date: 03/07/2022 CLINICAL DATA:  Coughing off and on since October EXAM: CHEST - 2 VIEW COMPARISON:  11/16/2021 FINDINGS: Normal heart size, mediastinal contours, and pulmonary  vascularity. Atherosclerotic calcification aorta. Lungs appear mildly hyperinflated but clear. No acute infiltrate, pleural effusion, or pneumothorax. Bones demineralized with evidence of prior cervical spine fusion and minimal levoconvex thoracic scoliosis. IMPRESSION: No acute abnormalities. Aortic Atherosclerosis (ICD10-I70.0). Electronically Signed   By: Lavonia Dana M.D.   On: 03/07/2022 17:06    EKG: Independently reviewed.  Sinus rhythm, borderline QT prolongation and U waves.  Assessment and Plan  Severe hypokalemia with EKG changes Potassium 2.2 and EKG showing borderline QT prolongation and U waves.  Magnesium is normal.  Patient takes torsemide at home. -Cardiac monitoring -Replace potassium and continue to monitor very closely -Repeat EKG -Hold torsemide at this time  AKI on CKD stage II-IIIa Likely prerenal from dehydration. BUN 40, creatinine 1.1 (baseline 0.7-0.8). -Gentle IV fluid hydration -Monitor renal function -Avoid nephrotoxic agents/hold home torsemide at this time  Chronic diastolic CHF Does not appear volume overloaded on exam. -Hold torsemide at this time and monitor volume status closely  CAD status post PCI Work-up not suggestive of  ACS. -Continue Zetia, pravastatin, and Imdur.  Chronic mild thrombocytopenia Stable, no signs of active bleeding. -Continue to monitor  Hypertension Stable. -Continue amlodipine and Imdur -Hold torsemide given AKI and severe hypokalemia  Hypothyroidism TSH was normal yesterday. -Continue Synthroid  ?UTI Urine dipstick done at PCPs office yesterday showing positive nitrite and large amount of leukocytes.  She is not endorsing any urinary symptoms.  No fever or leukocytosis. -Urine culture pending  DVT prophylaxis: Lovenox Code Status:  Partial code (no intubation or mechanical ventilation) Family Communication: Husband sleeping at bedside. Level of care: Telemetry bed Admission status: It is my clinical opinion  that referral for OBSERVATION is reasonable and necessary in this patient based on the above information provided. The aforementioned taken together are felt to place the patient at high risk for further clinical deterioration. However, it is anticipated that the patient may be medically stable for discharge from the hospital within 24 to 48 hours.   Shela Leff MD [facility]  If 7PM-7AM, please contact night-coverage [email or web address]  03/08/2022, 9:24 PM

## 2022-03-08 NOTE — ED Triage Notes (Signed)
Patient arrives POV with walker stating her doctor sent her for hypokalemia. Patient states she has had multiple falls since October. Went and got a shot for her left knee pain today. Reports shortness of breath and chest tightness when ambulating long distances.

## 2022-03-08 NOTE — ED Provider Notes (Signed)
[facility] DEPT Provider Note   CSN: [id number] Arrival date & time: 03/08/22  1323     History  Chief Complaint  Patient presents with   Shortness of Breath    Jill Preston is a 88 y.o. female.  HPI     88 year old female with a history of coronary artery disease, CHF, hypertension, CVA, CKD, subclavian stenosis, recent evaluation with cardiology which time they decreased her Imdur from 60 mg twice a day to 30 mg twice a day due to orthostatic hypotension on 6/16, was noted to have weight down 12 pounds compared to last clinic visit, who presents with concern for hypokalemia found on outpatient labs.   Fell on [employer] day, had a knee injection due to pain  Was in and out of doctor's offices Called to say go to the ED because of low potassium  Feels ok laying down but yesterday and today Felt some "crazy feelings" in her chest yesterday walking in and out of the doctors offices-walked a longer distance than usual, thinks that brought it on, had XR of foot yesterday, felt it twice yesterday afternoon and then again felt it today while walking in here Know has heart blockages on the back side Has never had symptoms like that, difficult to say if it is pain  If walking will feel short of breath, doing dishes, that has been going on for a while but has been worse Was walking with a cane, but for the last 2 weeks with a walker  Dr. Loletha Grayer was out of town and saw Gilford Rile NP and taken off of over the counter medications  Weight down to 182 this AM, usually 82-185, if 186 will take extra fluid pill     Past Medical History:  Diagnosis Date   Arthritis    right knee   Cancer ([place]) yrs ago   skin cancer removed from face   Chronic diastolic CHF (congestive heart failure) ([place])    Chronic venous insufficiency    LEA VENOUS, 10/17/2011 - mild reflux in bilateral common femoral veins   CKD (chronic kidney disease), stage III ([place])    Coronary artery  disease    a. s/p PCI/BMS to prox LAD and balloon angioplasty to mLAD with suboptimal result in 2000. b. Abnl nuc 2012, cath 09/2011 - showed that the overall territory of potential ischemia was small and attributable to a moderately diseased small second diagonal artery. Med rx.   Dyspnea    with activity   Headache    History of blood transfusion 2017   Hyperlipidemia    Hypertension    Hypertensive heart disease    Hypothyroidism    Obesity    Pneumonia    several times   Stroke [facility])    pt. states she had "light stroke" in sept. 1980   Urinary incontinence      Home Medications Prior to Admission medications   Medication Sig Start Date End Date Taking? Authorizing Provider  acetaminophen (TYLENOL) 500 MG tablet Take 500 mg by mouth every 6 (six) hours as needed for moderate pain or headache.    [provider]  amLODipine (NORVASC) 2.5 MG tablet TAKE ONE TABLET BY MOUTH EVERY MORNING 02/01/22   Croitoru, Mihai, MD  Apoaequorin (PREVAGEN PO) Take 1 tablet by mouth daily.    [provider]  cholecalciferol (VITAMIN D3) 25 MCG (1000 UNIT) tablet Take 1,000 Units by mouth daily.     [provider]  ezetimibe (ZETIA) 10  MG tablet TAKE ONE TABLET BY MOUTH EVERY MORNING 02/01/22   Croitoru, Dani Gobble, MD  isosorbide mononitrate (IMDUR) 30 MG 24 hr tablet Take 1 tablet (30 mg total) by mouth 2 (two) times daily. 03/02/22 02/25/23  Loel Dubonnet, NP  ketoconazole (NIZORAL) 2 % shampoo Apply 1 application. topically daily as needed for irritation. 06/27/21   [provider]  KLOR-CON M20 20 MEQ tablet Take 1 tablet (20 mEq total) by mouth 3 (three) times daily. 12/20/21   Croitoru, Mihai, MD  levothyroxine (SYNTHROID) 88 MCG tablet TAKE ONE TABLET BY MOUTH BEFORE BREAKFAST 01/31/22   [name], [name], NP  pravastatin (PRAVACHOL) 80 MG tablet Take 80 mg by mouth at bedtime.    [provider]  torsemide (DEMADEX) 100 MG tablet For a weight of 195 lbs or  less, take 50 mg (half a tablet) in the morning and 50 mg in the pm. For a weight of 196 lbs and higher, take 100 mg in the morning and 50 mg in the pm 01/03/22   Croitoru, Mihai, MD      Allergies    Atorvastatin, Penicillins, Shellfish allergy, Aminophylline, Iodinated contrast media, Latex, Oxybutynin chloride, Propoxyphene, Vancomycin, Adhesive [tape], Betadine [povidone iodine], Ciprofloxacin, Codeine, and Ranolazine    Review of Systems   Review of Systems  Physical Exam Updated Vital Signs BP (!) 153/85   Pulse 71   Temp 97.8 F (36.6 C) (Oral)   Resp 20   Ht 5' 5.5" (1.664 m)   Wt 85.3 kg   SpO2 95%   BMI 30.81 kg/m  Physical Exam Vitals and nursing note reviewed.  Constitutional:      General: She is not in acute distress.    Appearance: She is well-developed. She is not diaphoretic.  HENT:     Head: Normocephalic and atraumatic.  Eyes:     Conjunctiva/sclera: Conjunctivae normal.  Cardiovascular:     Rate and Rhythm: Normal rate and regular rhythm.     Heart sounds: Normal heart sounds. No murmur heard.    No friction rub. No gallop.  Pulmonary:     Effort: Pulmonary effort is normal. No respiratory distress.     Breath sounds: Normal breath sounds. No wheezing or rales.  Abdominal:     General: There is no distension.     Palpations: Abdomen is soft.     Tenderness: There is no abdominal tenderness. There is no guarding.  Musculoskeletal:        General: No tenderness.     Cervical back: Normal range of motion.     Comments: Contusion toes  Skin:    General: Skin is warm and dry.     Findings: No erythema or rash.  Neurological:     Mental Status: She is alert and oriented to person, place, and time.     ED Results / Procedures / Treatments   Labs (all labs ordered are listed, but only abnormal results are displayed) Labs Reviewed  CBC WITH DIFFERENTIAL/PLATELET - Abnormal; Notable for the following components:      Result Value   Hemoglobin 16.1  (*)    HCT 47.0 (*)    Platelets 113 (*)    All other components within normal limits  COMPREHENSIVE METABOLIC PANEL - Abnormal; Notable for the following components:   Potassium 2.2 (*)    Chloride 89 (*)    CO2 37 (*)    Glucose, Bld 111 (*)    BUN 40 (*)    Creatinine,  Ser 1.17 (*)    Calcium 10.4 (*)    AST 51 (*)    Total Bilirubin 1.5 (*)    GFR, Estimated 45 (*)    All other components within normal limits  BRAIN NATRIURETIC PEPTIDE - Abnormal; Notable for the following components:   B Natriuretic Peptide 204.5 (*)    All other components within normal limits  MAGNESIUM  TROPONIN I (HIGH SENSITIVITY)  TROPONIN I (HIGH SENSITIVITY)    EKG EKG Interpretation  Date/Time:  Thursday March 08 2022 13:32:45 EDT Ventricular Rate:  76 PR Interval:  187 QRS Duration: 104 QT Interval:  432 QTC Calculation: 486 R Axis:   56 Text Interpretation: Sinus rhythm - not clearly sinus but does appear regular with pw in v1 and is similar to prior Abnormal R-wave progression, early transition Probable LVH with secondary repol abnrm Borderline prolonged QT interval  Nonspecific ST changes in comparison to prior Confirmed by Gareth Morgan [phone]) on 03/08/2022 1:44:08 PM  Radiology DG Chest 2 View  Result Date: 03/08/2022 CLINICAL DATA:  Shortness of breath EXAM: CHEST - 2 VIEW COMPARISON:  Chest x-ray PA and lateral dated March 07, 2022 FINDINGS: The heart size and mediastinal contours are within normal limits. Both lungs are clear. The visualized skeletal structures are unremarkable. IMPRESSION: No active cardiopulmonary disease. Electronically Signed   By: Yetta Glassman M.D.   On: 03/08/2022 14:06   DG Foot Complete Left  Result Date: 03/07/2022 CLINICAL DATA:  Injury.  Pain. EXAM: LEFT FOOT - COMPLETE 3+ VIEW COMPARISON:  Left ankle radiographs 06/20/2021 FINDINGS: Mild-to-moderate plantar calcaneal heel spur and mild to moderate chronic spurring at the Achilles insertion on the  calcaneus, unchanged. Mild-to-moderate dorsal midfoot degenerative osteophytosis, unchanged. Mild hallux valgus. Mild great toe metatarsophalangeal joint space narrowing and peripheral osteophytosis. Mild flattening of the distal second metatarsal head and joint space narrowing and peripheral osteophytosis of the second metatarsophalangeal joint. Moderate third and mild-to-moderate second and fourth tarsometatarsal joint space narrowing. No acute fracture or dislocation. IMPRESSION: 1. Mild-to-moderate plantar calcaneal heel spur. 2. 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 3. Mild hallux valgus and mild great toe metatarsophalangeal osteoarthritis. Electronically Signed   By: Yvonne Kendall M.D.   On: 03/07/2022 17:52   DG Chest 2 View  Result Date: 03/07/2022 CLINICAL DATA:  Coughing off and on since October EXAM: CHEST - 2 VIEW COMPARISON:  11/16/2021 FINDINGS: Normal heart size, mediastinal contours, and pulmonary vascularity. Atherosclerotic calcification aorta. Lungs appear mildly hyperinflated but clear. No acute infiltrate, pleural effusion, or pneumothorax. Bones demineralized with evidence of prior cervical spine fusion and minimal levoconvex thoracic scoliosis. IMPRESSION: No acute abnormalities. Aortic Atherosclerosis (ICD10-I70.0). Electronically Signed   By: Lavonia Dana M.D.   On: 03/07/2022 17:06    Procedures .Critical Care  Performed by: Gareth Morgan, MD Authorized by: Gareth Morgan, MD   Critical care provider statement:    Critical care time (minutes):  30   Critical care was time spent personally by me on the following activities:  Development of treatment plan with patient or surrogate, evaluation of patient's response to treatment, examination of patient, ordering and review of laboratory studies, ordering and review of radiographic studies, ordering and performing treatments and interventions, pulse oximetry, re-evaluation of  patient's condition and review of old charts     Medications Ordered in ED Medications  potassium chloride 10 mEq in 100 mL IVPB (10 mEq Intravenous New Bag/Given 03/08/22 1522)  potassium chloride SA (KLOR-CON M) CR tablet  40 mEq (has no administration in time range)    ED Course/ Medical Decision Making/ A&P                           Medical Decision Making Amount and/or Complexity of Data Reviewed Labs: ordered. Radiology: ordered.  Risk Prescription drug management. Decision regarding hospitalization.    88 year old female with a history of coronary artery disease, CHF, hypertension, CVA, CKD, subclavian stenosis, recent evaluation with cardiology which time they decreased her Imdur from 60 mg twice a day to 30 mg twice a day due to orthostatic hypotension on 6/16, was noted to have weight down 12 pounds compared to last clinic visit, who presents with concern for hypokalemia found on outpatient labs.  Also notes chronic dyspnea on exertion and 2 episodes of an unusual sensation in her chest with exertion that is difficult to define as chest pain or palpitations.  EKG evaluated by me with nonspecific ST changes, appears to be sinus, some prolonged QTc and u waves.   Chest XR personally eval by me and shows no evidence of pulmonary edema, pneumonia, or pneumothorax.  BNP with mild elevation.  Troponin negative.  Unclear if her symptoms describe may be worsening exertional symptoms such as angina particularly in the setting of her recent decrease Imdur dose, or if they represent palpitations in the setting of hypokalemia.  Labs also personally evaluated and interpreted by me show potassium of 2.2, decreased from 2.5 previously, creatinine of 1.17, from 0.87 in April.  Magnesium within normal limits.  Ordered IV potassium, will admit given degree of hypokalemia, as well as other symptoms of palpitations or pain.   Ordered      Final Clinical Impression(s) / ED  Diagnoses Final diagnoses:  Hypokalemia    Rx / DC Orders ED Discharge Orders     None         Gareth Morgan, MD 03/08/22 1559

## 2022-03-08 NOTE — Plan of Care (Signed)
88 year old F with PMH of CAD s/p stent to LAD in 3235, PAD, diastolic CHF, CKD-3, HTN, DVT not on AC, thrombocytopenia, hypothyroidism and frequent falls directed to ED by PCP for hypokalemia.  Since she was seen by PCP at [facility] and had a blood and urine test as well as x-rays.  K was 2.5 and down to 2.2 in ED today.  She was also reported DOE and "funny feeling in her chest".  Stable vitals in ED.  CMP significant for hypokalemia to 2.2.  Mg is 2.1.  Hgb 16 (higher than baseline).  Platelet 113.  EKG looks like sinus with regular rate but not quite clear.  She was started on IV KCl.  Hospitalist service called for admission for hypokalemia.  Accepted to telemetry bed either at [facility] or [place] [facility].

## 2022-03-09 DIAGNOSIS — I2510 Atherosclerotic heart disease of native coronary artery without angina pectoris: Secondary | ICD-10-CM | POA: Diagnosis present

## 2022-03-09 DIAGNOSIS — E669 Obesity, unspecified: Secondary | ICD-10-CM | POA: Diagnosis present

## 2022-03-09 DIAGNOSIS — N179 Acute kidney failure, unspecified: Secondary | ICD-10-CM

## 2022-03-09 DIAGNOSIS — Z91013 Allergy to seafood: Secondary | ICD-10-CM | POA: Diagnosis not present

## 2022-03-09 DIAGNOSIS — E876 Hypokalemia: Secondary | ICD-10-CM | POA: Diagnosis present

## 2022-03-09 DIAGNOSIS — W57XXXA Bitten or stung by nonvenomous insect and other nonvenomous arthropods, initial encounter: Secondary | ICD-10-CM | POA: Diagnosis present

## 2022-03-09 DIAGNOSIS — I5032 Chronic diastolic (congestive) heart failure: Secondary | ICD-10-CM | POA: Diagnosis present

## 2022-03-09 DIAGNOSIS — E860 Dehydration: Secondary | ICD-10-CM | POA: Diagnosis present

## 2022-03-09 DIAGNOSIS — T502X5A Adverse effect of carbonic-anhydrase inhibitors, benzothiadiazides and other diuretics, initial encounter: Secondary | ICD-10-CM | POA: Diagnosis present

## 2022-03-09 DIAGNOSIS — S30861A Insect bite (nonvenomous) of abdominal wall, initial encounter: Secondary | ICD-10-CM | POA: Diagnosis present

## 2022-03-09 DIAGNOSIS — Z85828 Personal history of other malignant neoplasm of skin: Secondary | ICD-10-CM | POA: Diagnosis not present

## 2022-03-09 DIAGNOSIS — D696 Thrombocytopenia, unspecified: Secondary | ICD-10-CM | POA: Diagnosis present

## 2022-03-09 DIAGNOSIS — E785 Hyperlipidemia, unspecified: Secondary | ICD-10-CM | POA: Diagnosis present

## 2022-03-09 DIAGNOSIS — Z8673 Personal history of transient ischemic attack (TIA), and cerebral infarction without residual deficits: Secondary | ICD-10-CM | POA: Diagnosis not present

## 2022-03-09 DIAGNOSIS — Z955 Presence of coronary angioplasty implant and graft: Secondary | ICD-10-CM | POA: Diagnosis not present

## 2022-03-09 DIAGNOSIS — N183 Chronic kidney disease, stage 3 unspecified: Secondary | ICD-10-CM | POA: Diagnosis present

## 2022-03-09 DIAGNOSIS — E039 Hypothyroidism, unspecified: Secondary | ICD-10-CM | POA: Diagnosis present

## 2022-03-09 DIAGNOSIS — N390 Urinary tract infection, site not specified: Secondary | ICD-10-CM | POA: Diagnosis present

## 2022-03-09 DIAGNOSIS — Z888 Allergy status to other drugs, medicaments and biological substances status: Secondary | ICD-10-CM | POA: Diagnosis not present

## 2022-03-09 DIAGNOSIS — Z885 Allergy status to narcotic agent status: Secondary | ICD-10-CM | POA: Diagnosis not present

## 2022-03-09 DIAGNOSIS — I130 Hypertensive heart and chronic kidney disease with heart failure and stage 1 through stage 4 chronic kidney disease, or unspecified chronic kidney disease: Secondary | ICD-10-CM | POA: Diagnosis present

## 2022-03-09 DIAGNOSIS — Z881 Allergy status to other antibiotic agents status: Secondary | ICD-10-CM | POA: Diagnosis not present

## 2022-03-09 DIAGNOSIS — Z880 Allergy status to penicillin: Secondary | ICD-10-CM | POA: Diagnosis not present

## 2022-03-09 DIAGNOSIS — Z91048 Other nonmedicinal substance allergy status: Secondary | ICD-10-CM | POA: Diagnosis not present

## 2022-03-09 DIAGNOSIS — B9620 Unspecified Escherichia coli [E. coli] as the cause of diseases classified elsewhere: Secondary | ICD-10-CM | POA: Diagnosis present

## 2022-03-09 LAB — CBC
HCT: 42.3 % (ref 36.0–46.0)
Hemoglobin: 14.4 g/dL (ref 12.0–15.0)
MCH: 33.3 pg (ref 26.0–34.0)
MCHC: 34 g/dL (ref 30.0–36.0)
MCV: 97.9 fL (ref 80.0–100.0)
Platelets: 90 10*3/uL — ABNORMAL LOW (ref 150–400)
RBC: 4.32 MIL/uL (ref 3.87–5.11)
RDW: 14.2 % (ref 11.5–15.5)
WBC: 5 10*3/uL (ref 4.0–10.5)
nRBC: 0 % (ref 0.0–0.2)

## 2022-03-09 LAB — COMPREHENSIVE METABOLIC PANEL
ALT: 27 U/L (ref 0–44)
AST: 42 U/L — ABNORMAL HIGH (ref 15–41)
Albumin: 2.9 g/dL — ABNORMAL LOW (ref 3.5–5.0)
Alkaline Phosphatase: 57 U/L (ref 38–126)
Anion gap: 8 (ref 5–15)
BUN: 33 mg/dL — ABNORMAL HIGH (ref 8–23)
CO2: 33 mmol/L — ABNORMAL HIGH (ref 22–32)
Calcium: 8.9 mg/dL (ref 8.9–10.3)
Chloride: 99 mmol/L (ref 98–111)
Creatinine, Ser: 0.98 mg/dL (ref 0.44–1.00)
GFR, Estimated: 56 mL/min — ABNORMAL LOW (ref >60)
Glucose, Bld: 128 mg/dL — ABNORMAL HIGH (ref 70–99)
Potassium: 2.5 mmol/L — CL (ref 3.5–5.1)
Sodium: 140 mmol/L (ref 135–145)
Total Bilirubin: 1.1 mg/dL (ref 0.3–1.2)
Total Protein: 5.7 g/dL — ABNORMAL LOW (ref 6.5–8.1)

## 2022-03-09 LAB — CBC WITH DIFFERENTIAL/PLATELET
Absolute Monocytes: 616 cells/uL (ref 200–950)
Basophils Absolute: 51 cells/uL (ref 0–200)
Basophils Relative: 0.9 %
Eosinophils Absolute: 171 cells/uL (ref 15–500)
Eosinophils Relative: 3 %
HCT: 46.1 % — ABNORMAL HIGH (ref 35.0–45.0)
Hemoglobin: 16 g/dL — ABNORMAL HIGH (ref 11.7–15.5)
Lymphs Abs: 1220 cells/uL (ref 850–3900)
MCH: 33.4 pg — ABNORMAL HIGH (ref 27.0–33.0)
MCHC: 34.7 g/dL (ref 32.0–36.0)
MCV: 96.2 fL (ref 80.0–100.0)
MPV: 10.9 fL (ref 7.5–12.5)
Monocytes Relative: 10.8 %
Neutro Abs: 3642 cells/uL (ref 1500–7800)
Neutrophils Relative %: 63.9 %
Platelets: 129 10*3/uL — ABNORMAL LOW (ref 140–400)
RBC: 4.79 10*6/uL (ref 3.80–5.10)
RDW: 13.2 % (ref 11.0–15.0)
Total Lymphocyte: 21.4 %
WBC: 5.7 10*3/uL (ref 3.8–10.8)

## 2022-03-09 LAB — COMPLETE METABOLIC PANEL WITH GFR
AG Ratio: 1.4 (calc) (ref 1.0–2.5)
ALT: 28 U/L (ref 6–29)
AST: 53 U/L — ABNORMAL HIGH (ref 10–35)
Albumin: 3.8 g/dL (ref 3.6–5.1)
Alkaline phosphatase (APISO): 75 U/L (ref 37–153)
BUN/Creatinine Ratio: 37 (calc) — ABNORMAL HIGH (ref 6–22)
BUN: 43 mg/dL — ABNORMAL HIGH (ref 7–25)
CO2: 35 mmol/L — ABNORMAL HIGH (ref 20–32)
Calcium: 9.9 mg/dL (ref 8.6–10.4)
Chloride: 90 mmol/L — ABNORMAL LOW (ref 98–110)
Creat: 1.15 mg/dL — ABNORMAL HIGH (ref 0.60–0.95)
Globulin: 2.7 g/dL (calc) (ref 1.9–3.7)
Glucose, Bld: 100 mg/dL — ABNORMAL HIGH (ref 65–99)
Potassium: 2.5 mmol/L — CL (ref 3.5–5.3)
Sodium: 140 mmol/L (ref 135–146)
Total Bilirubin: 1.7 mg/dL — ABNORMAL HIGH (ref 0.2–1.2)
Total Protein: 6.5 g/dL (ref 6.1–8.1)
eGFR: 46 mL/min/{1.73_m2} — ABNORMAL LOW (ref >=60)

## 2022-03-09 LAB — LIPID PANEL
Cholesterol: 139 mg/dL (ref <200)
HDL: 71 mg/dL (ref >=50)
LDL Cholesterol (Calc): 52 mg/dL (calc)
Non-HDL Cholesterol (Calc): 68 mg/dL (calc) (ref <130)
Total CHOL/HDL Ratio: 2 (calc) (ref <5.0)
Triglycerides: 82 mg/dL (ref <150)

## 2022-03-09 LAB — URINE CULTURE
MICRO NUMBER:: 13554868
SPECIMEN QUALITY:: ADEQUATE

## 2022-03-09 LAB — POTASSIUM: Potassium: 2.8 mmol/L — ABNORMAL LOW (ref 3.5–5.1)

## 2022-03-09 LAB — TSH: TSH: 1.02 mIU/L (ref 0.40–4.50)

## 2022-03-09 MED ORDER — POTASSIUM CHLORIDE CRYS ER 20 MEQ PO TBCR
40.0000 meq | EXTENDED_RELEASE_TABLET | ORAL | Status: AC
  Administered 2022-03-09 – 2022-03-10 (×3): 40 meq via ORAL
  Filled 2022-03-09 (×3): qty 2

## 2022-03-09 MED ORDER — POTASSIUM CHLORIDE 10 MEQ/100ML IV SOLN
10.0000 meq | INTRAVENOUS | Status: AC
  Administered 2022-03-09 (×5): 10 meq via INTRAVENOUS
  Filled 2022-03-09 (×5): qty 100

## 2022-03-09 NOTE — Progress Notes (Signed)
[facility]  PROGRESS NOTE  Jill Preston [id number] DOB: [birth year] DOA: 03/08/2022 PCP: Sandrea Hughs, NP   Brief HPI:   88 year old female with history of chronic diastolic CHF, CKD stage III, CAD s/p PCI, PAD, DVT not on anticoagulation, chronic thrombocytopenia, obesity, hyperlipidemia, hypertension, hypothyroidism who was recently seen by cardiology dose of Imdur reduced from 60 mg twice daily started on twice daily for hypertension.  She came to ED with complaints of hypokalemia found on outpatient labs.  Patient states she has been having chronic fatigue, dyspnea exertion for past few days.  In the ED potassium was 2.2.  Patient was given potassium supplementation.    Subjective   This morning patient potassium continues to be low 2.5 despite aggressive replacement with IV KCl 10 meq x 5.   Assessment/Plan:   Severe hypokalemia -Secondary to diuresis from torsemide -Potassium was 2.2, still 2.5 after replacement with IV KCl 10 meq x 5. -EKG obtained yesterday showed U waves -She was given another IV KCl 10 mEq x 5 once and repeat potassium is 2.8 -We will give K-Dur 40 mEq every 4 hours x3 doses -Follow BMP in am -Serum magnesium is 2.1  Hypertension -Blood pressure is stable -Continue amlodipine, Imdur  Chronic diastolic CHF -Does not appear to volume overloaded -Torsemide on hold due to hypokalemia  CAD s/p PCI -Stable, no chest pain -Continue Zetia, pravastatin, Imdur  Acute kidney injury on CKD stage II-III a -Likely prerenal from dehydration, BUN 40, creatinine 1.17 -Continue gentle IV fluids -BUN improved to 33, creatinine 0.98 -Torsemide on hold  Hypothyroidism -Continue Synthroid  ?  UTI -Dipstick showed large leukocytes at PCP office -Urine culture pending -Patient is asymptomatic  Medications     amLODipine  2.5 mg Oral Daily   enoxaparin (LOVENOX) injection  40 mg Subcutaneous QHS   ezetimibe  10 mg Oral Daily    isosorbide mononitrate  30 mg Oral BID   levothyroxine  88 mcg Oral Q0600   pravastatin  80 mg Oral QHS     Data Reviewed:   CBG:  No results for input(s): "GLUCAP" in the last 168 hours.  SpO2: 94 %    Vitals:   03/08/22 1844 03/08/22 2236 03/09/22 0248 03/09/22 0649  BP: (!) 131/56 (!) 130/49 (!) 115/55 121/63  Pulse: 64 (!) 59 69 62  Resp: 20 18 18 18   Temp: 98.4 F (36.9 C) 98.1 F (36.7 C) 99 F (37.2 C) 98.6 F (37 C)  TempSrc: Oral Oral Oral Oral  SpO2: 97% 95% 94% 94%  Weight:      Height:          Data Reviewed:  Basic Metabolic Panel: Recent Labs  Lab 03/07/22 1014 03/08/22 1416 03/09/22 0439 03/09/22 1632  NA 140 137 140  --   K 2.5* 2.2* 2.5* 2.8*  CL 90* 89* 99  --   CO2 35* 37* 33*  --   GLUCOSE 100* 111* 128*  --   BUN 43* 40* 33*  --   CREATININE 1.15* 1.17* 0.98  --   CALCIUM 9.9 10.4* 8.9  --   MG  --  2.1  --   --     CBC: Recent Labs  Lab 03/07/22 1014 03/08/22 1416 03/09/22 0439  WBC 5.7 5.7 5.0  NEUTROABS 3,642 3.7  --   HGB 16.0* 16.1* 14.4  HCT 46.1* 47.0* 42.3  MCV 96.2 96.1 97.9  PLT 129* 113* 90*    LFT Recent Labs  Lab 03/07/22 1014 03/08/22 1416 03/09/22 0439  AST 53* 51* 42*  ALT 28 32 27  ALKPHOS  --  72 57  BILITOT 1.7* 1.5* 1.1  PROT 6.5 7.2 5.7*  ALBUMIN  --  4.1 2.9*     Antibiotics: Anti-infectives (From admission, onward)    None        DVT prophylaxis: Lovenox  Code Status: Full code  Family Communication: No family at bedside   CONSULTS    Objective    Physical Examination:  General-appears in no acute distress Heart-S1-S2, regular, no murmur auscultated Lungs-clear to auscultation bilaterally, no wheezing or crackles auscultated Abdomen-soft, nontender, no organomegaly Extremities-no edema in the lower extremities Neuro-alert, oriented x3, no focal deficit noted  Status is: Inpatient: Hypokalemia      She is  [place]   [facility] If 7PM-7AM,  please contact night-coverage at [email or web address], Office  [phone]   03/09/2022, 7:32 PM  LOS: 0 days            Days and then in the comments put knee brace knee brace, orthotics and get it from orthotics today

## 2022-03-09 NOTE — Progress Notes (Signed)
Taking over nursing care. Received report from previous nurse. Patient resting in bed and has no complaints at this time.

## 2022-03-09 NOTE — TOC Progression Note (Signed)
Transition of Care [facility]) - Progression Note    Patient Details  Name: Jill Preston MRN: [id number] Date of Birth: Jun 07, 1933  Transition of Care [facility]) CM/SW Contact  Purcell Mouton, RN Phone Number: 03/09/2022, 2:24 PM  Clinical Narrative:     Pt states she is from home with husband, agreed with Home Health. [facility] at home for [facility]. Pt states she has Walker, WC and [employer].  Expected Discharge Plan: [place] Barriers to Discharge: No Barriers Identified  Expected Discharge Plan and Services Expected Discharge Plan: [place] arrangements for the past 2 months: Single Family Home                                       Social Determinants of Health (SDOH) Interventions    Readmission Risk Interventions     No data to display

## 2022-03-09 NOTE — Progress Notes (Signed)
Paged Dr. Darrick Meigs in regards to pt's K 2.5 at 0439.... New orders placed.

## 2022-03-10 ENCOUNTER — Telehealth (HOSPITAL_COMMUNITY): Payer: Self-pay | Admitting: Emergency Medicine

## 2022-03-10 DIAGNOSIS — N179 Acute kidney failure, unspecified: Secondary | ICD-10-CM | POA: Diagnosis not present

## 2022-03-10 DIAGNOSIS — E876 Hypokalemia: Secondary | ICD-10-CM | POA: Diagnosis not present

## 2022-03-10 DIAGNOSIS — I5032 Chronic diastolic (congestive) heart failure: Secondary | ICD-10-CM | POA: Diagnosis not present

## 2022-03-10 DIAGNOSIS — E039 Hypothyroidism, unspecified: Secondary | ICD-10-CM | POA: Diagnosis not present

## 2022-03-10 LAB — BASIC METABOLIC PANEL WITH GFR
Anion gap: 8 (ref 5–15)
BUN: 21 mg/dL (ref 8–23)
CO2: 29 mmol/L (ref 22–32)
Calcium: 8.9 mg/dL (ref 8.9–10.3)
Chloride: 101 mmol/L (ref 98–111)
Creatinine, Ser: 0.73 mg/dL (ref 0.44–1.00)
GFR, Estimated: >60 mL/min
Glucose, Bld: 117 mg/dL — ABNORMAL HIGH (ref 70–99)
Potassium: 3.2 mmol/L — ABNORMAL LOW (ref 3.5–5.1)
Sodium: 138 mmol/L (ref 135–145)

## 2022-03-10 LAB — POTASSIUM: Potassium: 4 mmol/L (ref 3.5–5.1)

## 2022-03-10 MED ORDER — SULFAMETHOXAZOLE-TRIMETHOPRIM 800-160 MG PO TABS: 1.0000 | ORAL_TABLET | Freq: Two times a day (BID) | ORAL | 0 refills | Status: DC

## 2022-03-10 MED ORDER — KLOR-CON M20 20 MEQ PO TBCR: 40.0000 meq | EXTENDED_RELEASE_TABLET | Freq: Two times a day (BID) | ORAL | 3 refills | Status: DC

## 2022-03-10 MED ORDER — DOXYCYCLINE HYCLATE 100 MG PO TABS: 100.0000 mg | ORAL_TABLET | Freq: Two times a day (BID) | ORAL | 0 refills | Status: DC

## 2022-03-10 MED ORDER — POTASSIUM CHLORIDE CRYS ER 20 MEQ PO TBCR
40.0000 meq | EXTENDED_RELEASE_TABLET | Freq: Once | ORAL | Status: AC
  Administered 2022-03-10: 40 meq via ORAL
  Filled 2022-03-10: qty 2

## 2022-03-10 MED ORDER — DOXYCYCLINE HYCLATE 100 MG PO TABS
100.0000 mg | ORAL_TABLET | Freq: Two times a day (BID) | ORAL | Status: DC
  Administered 2022-03-10: 100 mg via ORAL
  Filled 2022-03-10: qty 1

## 2022-03-10 NOTE — Progress Notes (Signed)
Waiting on pt's family to bring her clothes and transportation

## 2022-03-10 NOTE — Discharge Summary (Addendum)
Physician Discharge Summary   Patient: Jill Preston MRN: [id number] DOB: 1933-04-17  Admit date:     03/08/2022  Discharge date: 03/10/22  Discharge Physician: Oswald Hillock   PCP: Sandrea Hughs, NP   Recommendations at discharge:   Follow-up cardiology in 3 days recheck BMP as outpatient Potassium dose increased to 40 mEq p.o. twice daily Follow-up PCP in 1 week to check tickborne illness serology for Lyme, ehrlichiosis, [facility] spotted fever  Discharge Diagnoses: Principal Problem:   Hypokalemia Active Problems:   AKI (acute kidney injury) ([place])   Hypothyroidism   CKD (chronic kidney disease), stage III (HCC)   Chronic diastolic CHF (congestive heart failure) (HCC)   CAD in native artery  Resolved Problems:   * No resolved hospital problems. *  Hospital Course:  88 year old female with history of chronic diastolic CHF, CKD stage III, CAD s/p PCI, PAD, DVT not on anticoagulation, chronic thrombocytopenia, obesity, hyperlipidemia, hypertension, hypothyroidism who was recently seen by cardiology dose of Imdur reduced from 60 mg twice daily started on twice daily for hypertension.  She came to ED with complaints of hypokalemia found on outpatient labs.  Patient states she has been having chronic fatigue, dyspnea exertion for past few days.  In the ED potassium was 2.2.  Patient was given potassium supplementation.  Assessment and Plan:  Severe hypokalemia -Resolved -Secondary to diuresis from torsemide -Potassium was 2.2 on admission -EKG obtained yesterday showed U waves -After multiple rounds of IV KCl and p.o. K-Dur, potassium is up to 4.0 today -Serum magnesium is 2.1 -Dose of potassium will be changed to 40 mg p.o. twice daily at home -Torsemide will be resumed -Follow-up cardiology in 3 to 4 days to check BMP as outpatient   Hypertension -Blood pressure is stable -Continue amlodipine, Imdur   Chronic diastolic CHF -Euvolemic -Continue torsemide as  directed   CAD s/p PCI -Stable, no chest pain -Continue Zetia, pravastatin, Imdur   Acute kidney injury on CKD stage II-III a -Likely prerenal from dehydration, BUN 40, creatinine 1.17 -Continue gentle IV fluids -BUN improved to 33, creatinine 0.98 -Torsemide will be resumed   Hypothyroidism -Continue Synthroid   ?  UTI -Dipstick showed large leukocytes at PCP office -Urine culture showed E. Coli sensitive to Bactrim -We will discharge on Bactrim DS 1 tablet p.o. twice daily for 5 days  Tick bite -Patient was found to have live tick attached to her left groin -Tick was removed -Likely Dog tick, she had significant surrounding erythema -Patient started on doxycycline 100 mg twice daily for 10 days -Serology for Lyme, ehrlichiosis, RMSF -PCP can follow-up on the results as outpatient       Consultants:  Procedures performed:  Disposition: Home Diet recommendation:  Discharge Diet Orders (From admission, onward)     Start     Ordered   03/10/22 0000  Diet - low sodium heart healthy        03/10/22 1419           Cardiac diet DISCHARGE MEDICATION: Allergies as of 03/10/2022       Reactions   Atorvastatin Anaphylaxis, Other (See Comments)   Patient does not remember; caused pneumonia- took her off of it per patient   Penicillins Anaphylaxis, Hives, Swelling, Other (See Comments)   Tongue swelling Did it involve swelling of the face/tongue/throat, SOB, or low BP? Yes Did it involve sudden or severe rash/hives, skin peeling, or any reaction on the inside of your mouth or nose? Yes Did  you need to seek medical attention at a hospital or doctor's office? Yes When did it last happen?  89 or 88 years old    If all above answers are "NO", may proceed with cephalosporin use.   Shellfish Allergy Anaphylaxis, Nausea And Vomiting, Other (See Comments)   Severe nausea and vomiting   Aminophylline Itching, Swelling, Rash, Other (See Comments)   Pt experienced burning  urination, itching, redness/rash, and swelling of genitals after given this medication via IV push.   Iodinated Contrast Media Swelling, Other (See Comments)   FLUSHING, also; LLE became swollen   Latex Other (See Comments)   Causes blisters   Oxybutynin Chloride Other (See Comments)   "blisters"   Propoxyphene Other (See Comments)   Unknown reaction   Vancomycin Hives, Other (See Comments)   06/02/2018- Hives arm, back and chest   Adhesive [tape] Rash, Other (See Comments)   Paper tape is ok   Betadine [povidone Iodine] Rash   Ciprofloxacin Hives   Codeine Nausea And Vomiting   .   Ranolazine Other (See Comments)   Constipation .        Medication List     TAKE these medications    amLODipine 2.5 MG tablet Commonly known as: NORVASC TAKE ONE TABLET BY MOUTH EVERY MORNING What changed: when to take this   cholecalciferol 25 MCG (1000 UNIT) tablet Commonly known as: VITAMIN D3 Take 1,000 Units by mouth daily.   doxycycline 100 MG tablet Commonly known as: VIBRA-TABS Take 1 tablet (100 mg total) by mouth every 12 (twelve) hours.   ezetimibe 10 MG tablet Commonly known as: ZETIA TAKE ONE TABLET BY MOUTH EVERY MORNING What changed: when to take this   isosorbide mononitrate 30 MG 24 hr tablet Commonly known as: IMDUR Take 1 tablet (30 mg total) by mouth 2 (two) times daily.   ketoconazole 2 % shampoo Commonly known as: NIZORAL Apply 1 application  topically daily as needed for irritation (or itching- SHAMPOO).   Klor-Con M20 20 MEQ tablet Generic drug: potassium chloride SA Take 2 tablets (40 mEq total) by mouth 2 (two) times daily. What changed:  how much to take when to take this   levothyroxine 88 MCG tablet Commonly known as: SYNTHROID TAKE ONE TABLET BY MOUTH BEFORE BREAKFAST What changed: See the new instructions.   pravastatin 80 MG tablet Commonly known as: PRAVACHOL Take 80 mg by mouth at bedtime.   torsemide 100 MG tablet Commonly known as:  DEMADEX For a weight of 195 lbs or less, take 50 mg (half a tablet) in the morning and 50 mg in the pm. For a weight of 196 lbs and higher, take 100 mg in the morning and 50 mg in the pm What changed:  how much to take how to take this when to take this additional instructions   Tylenol 8 Hour Arthritis Pain 650 MG CR tablet Generic drug: acetaminophen Take 650 mg by mouth every 8 (eight) hours as needed for pain.   vitamin C 500 MG tablet Commonly known as: ASCORBIC ACID Take 500 mg by mouth daily.        Follow-up Information     [name], [name], NP Follow up in 1 week(s).   Specialty: Family Medicine Contact information: [place] [address] [phone]         Croitoru, Dani Gobble, MD. Schedule an appointment as soon as possible for a visit in 3 day(s).   Specialty: Cardiology Contact information: [address]  [place] [address] [phone]                Discharge Exam: Danley Danker Weights   03/08/22 1334  Weight: 85.3 kg   General-appears in no acute distress Heart-S1-S2, regular, no murmur auscultated Lungs-clear to auscultation bilaterally, no wheezing or crackles auscultated Abdomen-soft, nontender, no organomegaly Extremities-no edema in the lower extremities Neuro-alert, oriented x3, no focal deficit noted  Condition at discharge: good  The results of significant diagnostics from this hospitalization (including imaging, microbiology, ancillary and laboratory) are listed below for reference.   Imaging Studies: DG Chest 2 View  Result Date: 03/08/2022 CLINICAL DATA:  Shortness of breath EXAM: CHEST - 2 VIEW COMPARISON:  Chest x-ray PA and lateral dated March 07, 2022 FINDINGS: The heart size and mediastinal contours are within normal limits. Both lungs are clear. The visualized skeletal structures are unremarkable. IMPRESSION: No active cardiopulmonary disease. Electronically Signed   By: Yetta Glassman M.D.   On:  03/08/2022 14:06   DG Foot Complete Left  Result Date: 03/07/2022 CLINICAL DATA:  Injury.  Pain. EXAM: LEFT FOOT - COMPLETE 3+ VIEW COMPARISON:  Left ankle radiographs 06/20/2021 FINDINGS: Mild-to-moderate plantar calcaneal heel spur and mild to moderate chronic spurring at the Achilles insertion on the calcaneus, unchanged. Mild-to-moderate dorsal midfoot degenerative osteophytosis, unchanged. Mild hallux valgus. Mild great toe metatarsophalangeal joint space narrowing and peripheral osteophytosis. Mild flattening of the distal second metatarsal head and joint space narrowing and peripheral osteophytosis of the second metatarsophalangeal joint. Moderate third and mild-to-moderate second and fourth tarsometatarsal joint space narrowing. No acute fracture or dislocation. IMPRESSION: 1. Mild-to-moderate plantar calcaneal heel spur. 2. 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 3. Mild hallux valgus and mild great toe metatarsophalangeal osteoarthritis. Electronically Signed   By: Yvonne Kendall M.D.   On: 03/07/2022 17:52   DG Chest 2 View  Result Date: 03/07/2022 CLINICAL DATA:  Coughing off and on since October EXAM: CHEST - 2 VIEW COMPARISON:  11/16/2021 FINDINGS: Normal heart size, mediastinal contours, and pulmonary vascularity. Atherosclerotic calcification aorta. Lungs appear mildly hyperinflated but clear. No acute infiltrate, pleural effusion, or pneumothorax. Bones demineralized with evidence of prior cervical spine fusion and minimal levoconvex thoracic scoliosis. IMPRESSION: No acute abnormalities. Aortic Atherosclerosis (ICD10-I70.0). Electronically Signed   By: Lavonia Dana M.D.   On: 03/07/2022 17:06    Microbiology: Results for orders placed or performed in visit on 03/07/22  Urine Culture     Status: Abnormal   Collection Time: 03/07/22 10:14 AM   Specimen: Urine  Result Value Ref Range Status   MICRO NUMBER: [id number]  Final   SPECIMEN  QUALITY: Adequate  Final   Sample Source URINE, CLEAN CATCH  Final   STATUS: FINAL  Final   ISOLATE 1: Escherichia coli (A)  Final    Comment: Greater than 100,000 CFU/mL of Escherichia coli      Susceptibility   Escherichia coli - URINE CULTURE, REFLEX    AMOX/CLAVULANIC <=2 Sensitive     AMPICILLIN <=2 Sensitive     AMPICILLIN/SULBACTAM <=2 Sensitive     CEFAZOLIN* <=4 Not Reportable      * For infections other than uncomplicated UTI caused by E. coli, K. pneumoniae or P. mirabilis: Cefazolin is resistant if MIC > or = 8 mcg/mL. (Distinguishing susceptible versus intermediate for isolates with MIC < or = 4 mcg/mL requires additional testing.) For uncomplicated UTI caused by E. coli, K. pneumoniae or P. mirabilis: Cefazolin is susceptible if MIC <  32 mcg/mL and predicts susceptible to the oral agents cefaclor, cefdinir, cefpodoxime, cefprozil, cefuroxime, cephalexin and loracarbef.     CEFTAZIDIME <=1 Sensitive     CEFEPIME <=1 Sensitive     CEFTRIAXONE <=1 Sensitive     CIPROFLOXACIN <=0.25 Sensitive     LEVOFLOXACIN <=0.12 Sensitive     GENTAMICIN <=1 Sensitive     IMIPENEM <=0.25 Sensitive     NITROFURANTOIN <=16 Sensitive     PIP/TAZO <=4 Sensitive     TOBRAMYCIN <=1 Sensitive     TRIMETH/SULFA* <=20 Sensitive      * For infections other than uncomplicated UTI caused by E. coli, K. pneumoniae or P. mirabilis: Cefazolin is resistant if MIC > or = 8 mcg/mL. (Distinguishing susceptible versus intermediate for isolates with MIC < or = 4 mcg/mL requires additional testing.) For uncomplicated UTI caused by E. coli, K. pneumoniae or P. mirabilis: Cefazolin is susceptible if MIC <32 mcg/mL and predicts susceptible to the oral agents cefaclor, cefdinir, cefpodoxime, cefprozil, cefuroxime, cephalexin and loracarbef. Legend: S = Susceptible  I = Intermediate R = Resistant  NS = Not susceptible * = Not tested  NR = Not reported **NN = See antimicrobic comments      Labs: CBC: Recent Labs  Lab 03/07/22 1014 03/08/22 1416 03/09/22 0439  WBC 5.7 5.7 5.0  NEUTROABS 3,642 3.7  --   HGB 16.0* 16.1* 14.4  HCT 46.1* 47.0* 42.3  MCV 96.2 96.1 97.9  PLT 129* 113* 90*   Basic Metabolic Panel: Recent Labs  Lab 03/07/22 1014 03/08/22 1416 03/09/22 0439 03/09/22 1632 03/10/22 0444 03/10/22 1247  NA 140 137 140  --  138  --   K 2.5* 2.2* 2.5* 2.8* 3.2* 4.0  CL 90* 89* 99  --  101  --   CO2 35* 37* 33*  --  29  --   GLUCOSE 100* 111* 128*  --  117*  --   BUN 43* 40* 33*  --  21  --   CREATININE 1.15* 1.17* 0.98  --  0.73  --   CALCIUM 9.9 10.4* 8.9  --  8.9  --   MG  --  2.1  --   --   --   --    Liver Function Tests: Recent Labs  Lab 03/07/22 1014 03/08/22 1416 03/09/22 0439  AST 53* 51* 42*  ALT 28 32 27  ALKPHOS  --  72 57  BILITOT 1.7* 1.5* 1.1  PROT 6.5 7.2 5.7*  ALBUMIN  --  4.1 2.9*   CBG: No results for input(s): "GLUCAP" in the last 168 hours.  Discharge time spent: greater than 30 minutes.  Signed: Oswald Hillock, MD [facility] 03/10/2022

## 2022-03-10 NOTE — Progress Notes (Addendum)
Ambulated pt w/x1 assist to [facility].... tolerated fair.... upon removing her diaper, noticed a rash on left hip along w/what appears to be a tick... pt states rash has been there for at least 2 weeks when family member went out to woods to cut wood... she had noticed 2 ticks on her crawling up but was able to remove them and states she must have missed one ... notified MD.

## 2022-03-10 NOTE — Progress Notes (Signed)
Pt being d/c.  PIVs removed Discharge information given.  Pt verb understanding and had no further questions Pt wheel chaired down to vehicle.

## 2022-03-12 ENCOUNTER — Encounter: Payer: Self-pay | Admitting: Family

## 2022-03-12 ENCOUNTER — Other Ambulatory Visit: Payer: HMO

## 2022-03-12 ENCOUNTER — Ambulatory Visit (INDEPENDENT_AMBULATORY_CARE_PROVIDER_SITE_OTHER): Payer: HMO | Admitting: Family

## 2022-03-12 VITALS — BP 132/80 | HR 87 | Temp 96.9°F | Resp 18 | Ht 65.5 in | Wt 188.0 lb

## 2022-03-12 DIAGNOSIS — W57XXXD Bitten or stung by nonvenomous insect and other nonvenomous arthropods, subsequent encounter: Secondary | ICD-10-CM

## 2022-03-12 DIAGNOSIS — N3001 Acute cystitis with hematuria: Secondary | ICD-10-CM | POA: Diagnosis not present

## 2022-03-12 DIAGNOSIS — F411 Generalized anxiety disorder: Secondary | ICD-10-CM | POA: Diagnosis not present

## 2022-03-12 DIAGNOSIS — D696 Thrombocytopenia, unspecified: Secondary | ICD-10-CM | POA: Diagnosis not present

## 2022-03-12 DIAGNOSIS — I5032 Chronic diastolic (congestive) heart failure: Secondary | ICD-10-CM | POA: Diagnosis not present

## 2022-03-12 DIAGNOSIS — E876 Hypokalemia: Secondary | ICD-10-CM | POA: Diagnosis not present

## 2022-03-12 DIAGNOSIS — I110 Hypertensive heart disease with heart failure: Secondary | ICD-10-CM

## 2022-03-12 DIAGNOSIS — S30861D Insect bite (nonvenomous) of abdominal wall, subsequent encounter: Secondary | ICD-10-CM | POA: Diagnosis not present

## 2022-03-12 DIAGNOSIS — M19071 Primary osteoarthritis, right ankle and foot: Secondary | ICD-10-CM | POA: Diagnosis not present

## 2022-03-12 LAB — CBC WITH DIFFERENTIAL/PLATELET
Absolute Monocytes: 442 cells/uL (ref 200–950)
Basophils Absolute: 62 cells/uL (ref 0–200)
Basophils Relative: 1.2 %
Eosinophils Absolute: 400 cells/uL (ref 15–500)
Eosinophils Relative: 7.7 %
HCT: 45.6 % — ABNORMAL HIGH (ref 35.0–45.0)
Hemoglobin: 15.6 g/dL — ABNORMAL HIGH (ref 11.7–15.5)
Lymphs Abs: 1238 cells/uL (ref 850–3900)
MCH: 33.7 pg — ABNORMAL HIGH (ref 27.0–33.0)
MCHC: 34.2 g/dL (ref 32.0–36.0)
MCV: 98.5 fL (ref 80.0–100.0)
MPV: 11.6 fL (ref 7.5–12.5)
Monocytes Relative: 8.5 %
Neutro Abs: 3058 cells/uL (ref 1500–7800)
Neutrophils Relative %: 58.8 %
Platelets: 122 10*3/uL — ABNORMAL LOW (ref 140–400)
RBC: 4.63 10*6/uL (ref 3.80–5.10)
RDW: 13.3 % (ref 11.0–15.0)
Total Lymphocyte: 23.8 %
WBC: 5.2 10*3/uL (ref 3.8–10.8)

## 2022-03-12 LAB — BASIC METABOLIC PANEL WITH GFR
BUN/Creatinine Ratio: 19 (calc) (ref 6–22)
BUN: 21 mg/dL (ref 7–25)
CO2: 29 mmol/L (ref 20–32)
Calcium: 9.6 mg/dL (ref 8.6–10.4)
Chloride: 100 mmol/L (ref 98–110)
Creat: 1.1 mg/dL — ABNORMAL HIGH (ref 0.60–0.95)
Glucose, Bld: 90 mg/dL (ref 65–99)
Potassium: 4 mmol/L (ref 3.5–5.3)
Sodium: 138 mmol/L (ref 135–146)
eGFR: 49 mL/min/{1.73_m2} — ABNORMAL LOW (ref >=60)

## 2022-03-12 MED ORDER — SERTRALINE HCL 25 MG PO TABS: 25.0000 mg | ORAL_TABLET | Freq: Every day | ORAL | 3 refills | Status: DC
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
## 2022-03-13 ENCOUNTER — Telehealth: Payer: Self-pay

## 2022-03-13 NOTE — Telephone Encounter (Signed)
Patient has been seen in the ER 

## 2022-03-13 NOTE — Telephone Encounter (Signed)
Transition Care Management Unsuccessful Follow-up Telephone Call  Date of discharge and from where:  03/10/2022,[facility]   Attempts:  1st Attempt  Reason for unsuccessful TCM follow-up call:  Unable to reach patient, patient had appointment with PCP on 03/13/2019. Nothing else follows.

## 2022-03-14 ENCOUNTER — Telehealth: Payer: Self-pay

## 2022-03-14 DIAGNOSIS — I739 Peripheral vascular disease, unspecified: Secondary | ICD-10-CM | POA: Diagnosis not present

## 2022-03-14 DIAGNOSIS — N1831 Chronic kidney disease, stage 3a: Secondary | ICD-10-CM | POA: Diagnosis not present

## 2022-03-14 DIAGNOSIS — Z792 Long term (current) use of antibiotics: Secondary | ICD-10-CM | POA: Diagnosis not present

## 2022-03-14 DIAGNOSIS — R531 Weakness: Secondary | ICD-10-CM | POA: Diagnosis not present

## 2022-03-14 DIAGNOSIS — I495 Sick sinus syndrome: Secondary | ICD-10-CM | POA: Diagnosis not present

## 2022-03-14 DIAGNOSIS — N390 Urinary tract infection, site not specified: Secondary | ICD-10-CM | POA: Diagnosis not present

## 2022-03-14 DIAGNOSIS — I5032 Chronic diastolic (congestive) heart failure: Secondary | ICD-10-CM | POA: Diagnosis not present

## 2022-03-14 DIAGNOSIS — I110 Hypertensive heart disease with heart failure: Secondary | ICD-10-CM | POA: Diagnosis not present

## 2022-03-14 DIAGNOSIS — I2510 Atherosclerotic heart disease of native coronary artery without angina pectoris: Secondary | ICD-10-CM | POA: Diagnosis not present

## 2022-03-14 DIAGNOSIS — I872 Venous insufficiency (chronic) (peripheral): Secondary | ICD-10-CM | POA: Diagnosis not present

## 2022-03-14 NOTE — Telephone Encounter (Signed)
Home health PT evaluation and would like a verbal okay to see the patient for 1 week 2, 2 week 2, 1 week 3.  Called [place] and gave verbal authorization

## 2022-03-15 ENCOUNTER — Encounter: Payer: HMO | Admitting: Family

## 2022-03-15 LAB — LYME DISEASE SEROLOGY W/REFLEX: Lyme Total Antibody EIA: NEGATIVE

## 2022-03-16 LAB — EHRLICHIA ANTIBODY PANEL
E chaffeensis (HGE) Ab, IgG: NEGATIVE
E chaffeensis (HGE) Ab, IgM: NEGATIVE
E. Chaffeensis (HME) IgM Titer: NEGATIVE
E.Chaffeensis (HME) IgG: NEGATIVE

## 2022-03-16 LAB — ROCKY MTN SPOTTED FVR ABS PNL(IGG+IGM)
RMSF IgG: NEGATIVE
RMSF IgM: 0.22 index (ref 0.00–0.89)

## 2022-03-18 NOTE — Progress Notes (Signed)
  This encounter was created in error - please disregard. No show 

## 2022-03-21 ENCOUNTER — Other Ambulatory Visit: Payer: HMO

## 2022-03-21 DIAGNOSIS — I739 Peripheral vascular disease, unspecified: Secondary | ICD-10-CM | POA: Diagnosis not present

## 2022-03-21 DIAGNOSIS — I110 Hypertensive heart disease with heart failure: Secondary | ICD-10-CM | POA: Diagnosis not present

## 2022-03-21 DIAGNOSIS — E876 Hypokalemia: Secondary | ICD-10-CM

## 2022-03-21 DIAGNOSIS — N390 Urinary tract infection, site not specified: Secondary | ICD-10-CM | POA: Diagnosis not present

## 2022-03-21 DIAGNOSIS — N1831 Chronic kidney disease, stage 3a: Secondary | ICD-10-CM | POA: Diagnosis not present

## 2022-03-21 DIAGNOSIS — I2510 Atherosclerotic heart disease of native coronary artery without angina pectoris: Secondary | ICD-10-CM | POA: Diagnosis not present

## 2022-03-21 DIAGNOSIS — I872 Venous insufficiency (chronic) (peripheral): Secondary | ICD-10-CM | POA: Diagnosis not present

## 2022-03-21 DIAGNOSIS — R531 Weakness: Secondary | ICD-10-CM | POA: Diagnosis not present

## 2022-03-21 DIAGNOSIS — I495 Sick sinus syndrome: Secondary | ICD-10-CM | POA: Diagnosis not present

## 2022-03-21 DIAGNOSIS — Z792 Long term (current) use of antibiotics: Secondary | ICD-10-CM | POA: Diagnosis not present

## 2022-03-21 DIAGNOSIS — I5032 Chronic diastolic (congestive) heart failure: Secondary | ICD-10-CM | POA: Diagnosis not present

## 2022-03-22 LAB — BASIC METABOLIC PANEL WITH GFR
BUN: 25 mg/dL (ref 7–25)
CO2: 30 mmol/L (ref 20–32)
Calcium: 9.2 mg/dL (ref 8.6–10.4)
Chloride: 102 mmol/L (ref 98–110)
Creat: 0.85 mg/dL (ref 0.60–0.95)
Glucose, Bld: 94 mg/dL (ref 65–99)
Potassium: 3.8 mmol/L (ref 3.5–5.3)
Sodium: 144 mmol/L (ref 135–146)
eGFR: 67 mL/min/{1.73_m2} (ref >=60)

## 2022-03-26 DIAGNOSIS — D692 Other nonthrombocytopenic purpura: Secondary | ICD-10-CM | POA: Diagnosis not present

## 2022-03-26 DIAGNOSIS — E876 Hypokalemia: Secondary | ICD-10-CM | POA: Diagnosis not present

## 2022-03-26 DIAGNOSIS — Z515 Encounter for palliative care: Secondary | ICD-10-CM | POA: Diagnosis not present

## 2022-03-26 DIAGNOSIS — K7460 Unspecified cirrhosis of liver: Secondary | ICD-10-CM | POA: Diagnosis not present

## 2022-03-29 ENCOUNTER — Encounter: Payer: Self-pay | Admitting: Cardiovascular Disease

## 2022-03-29 ENCOUNTER — Ambulatory Visit (INDEPENDENT_AMBULATORY_CARE_PROVIDER_SITE_OTHER): Payer: PPO | Admitting: Cardiovascular Disease

## 2022-03-29 VITALS — BP 110/62 | HR 55 | Ht 65.5 in | Wt 186.2 lb

## 2022-03-29 DIAGNOSIS — I5032 Chronic diastolic (congestive) heart failure: Secondary | ICD-10-CM | POA: Diagnosis not present

## 2022-03-29 DIAGNOSIS — I739 Peripheral vascular disease, unspecified: Secondary | ICD-10-CM | POA: Diagnosis not present

## 2022-03-29 DIAGNOSIS — I10 Essential (primary) hypertension: Secondary | ICD-10-CM

## 2022-03-29 DIAGNOSIS — E669 Obesity, unspecified: Secondary | ICD-10-CM

## 2022-03-29 DIAGNOSIS — E7800 Pure hypercholesterolemia, unspecified: Secondary | ICD-10-CM | POA: Diagnosis not present

## 2022-03-29 DIAGNOSIS — I25118 Atherosclerotic heart disease of native coronary artery with other forms of angina pectoris: Secondary | ICD-10-CM | POA: Diagnosis not present

## 2022-03-29 MED ORDER — SPIRONOLACTONE 25 MG PO TABS: 25.0000 mg | ORAL_TABLET | Freq: Every day | ORAL | 3 refills | Status: DC

## 2022-03-29 MED ORDER — KLOR-CON M20 20 MEQ PO TBCR: 20.0000 meq | EXTENDED_RELEASE_TABLET | Freq: Two times a day (BID) | ORAL | 1 refills | Status: DC

## 2022-03-29 NOTE — Patient Instructions (Addendum)
Medication Instructions:  START Spironolactone 25 mg once daily  TAKE Amlodipine 2.5 mg once daily  *If you need a refill on your cardiac medications before your next appointment, please call your pharmacy*   Lab Work: Your provider would like for you to return in 3-4 weeks to have the following labs drawn: BMET. You do not need an appointment for the lab. Once in our office lobby there is a podium where you can sign in and ring the doorbell to alert [place] that you are here. The lab is open from 8:00 am to 4 pm; closed for lunch from 12:45pm-1:45pm.  You may also go to any of these [facility] locations:   [place] ([place]) - [place] [address] [place] [address] - [address] [place] - [address] ([facility]  If you have labs (blood work) drawn today and your tests are completely normal, you will receive your results only by: [employer] (if you have MyChart) OR A paper copy in the mail If you have any lab test that is abnormal or we need to change your treatment, we will call you to review the results.   Testing/Procedures: None ordered   Follow-Up: At [facility], you and your health needs are our priority.  As part of our continuing mission to provide you with exceptional heart care, we have created designated Provider Care Teams.  These Care Teams include your primary Cardiologist (physician) and Advanced Practice Providers (APPs -  Physician Assistants and Nurse Practitioners) who all work together to provide you with the care you need, when you need it.  We recommend signing up for the patient portal called "MyChart".  Sign up information is provided on this After Visit Summary.  MyChart is used to connect with patients  for Virtual Visits (Telemedicine).  Patients are able to view lab/test results, encounter notes, upcoming appointments, etc.  Non-urgent messages can be sent to your provider as well.   To learn more about what you can do with MyChart, go to [email or web address].    Your next appointment:   3 month(s)  The format for your next appointment:   In Person  Provider:   Sanda Klein, MD or APP   Important Information About Sugar

## 2022-03-29 NOTE — Progress Notes (Signed)
Patient ID: ZOPHIA MARRONE, female   DOB: Dec 10, 1932, 88 y.o.   MRN: [id number]    Cardiology Office Note    Date:  04/03/2022   ID:  Jill Preston, DOB October 29, 1932, MRN [id number]  PCP:  Sandrea Hughs, NP  Cardiologist:   Sanda Klein, MD   Chief Complaint  Patient presents with   Congestive Heart Failure          History of Present Illness:  Jill Preston is a 88 y.o. female returning in follow-up for CAD and CHF.  Has a moderate stenosis of the left main coronary artery, not  hemodynamically significant by FFR analysis in 2016 and again in 2018.  She has chronic diastolic heart failure that responds well to diuretics, but excessive diuresis has occasionally led to acute kidney injury.  Most recently the diuretics have led to severe hypokalemia.  Potassium was only 2.5 on 03/09/2022 if she was briefly hospitalized for this (as well as urinary tract infection) when she complained of generalized weakness, dyspnea on exertion and palpitations.  She was discharged from the hospital roughly 24 hours later with a potassium of 4.0 and has had labs checked twice since then, still in normal range.  Improvement of potassium level has led to some improvement in her chest pressure, but this is a chronic complaint.  She is wearing an orthosis on her right ankle and this has helped with her stability.  Her weight on her home scale is consistently in the 180-185 range and on the office scale today she weighs 186 pounds.  This is a substantial drop in her previous estimated dry weight of 190 pounds on her home scale.  She has not had falls, dizziness or syncope.  She denies lower extremity edema.  She is prone to falling, but has not had any serious injuries since October 2022 when she had head impact, thankfully without intracranial bleeding.   She has a long history of coronary disease. She underwent proximal LAD bare-metal stenting and balloon angioplasty of the mid LAD in 2013. She has  had persistent ischemia in the territory of the diagonal artery on nuclear stress test performed over the last few years. Coronary angiography performed May 2016 showed a widely patent LAD stent, 60% ostial circumflex stenosis, 20-30% right coronary artery stenosis. There was concern about possible left coronary artery stenosis but fractional flow reserve was normal (0.96 at baseline, 0.91 during intravenous adenosine infusion). She had good anginal response to Ranexa but developed severe constipation and could not tolerate the medication. She has been intolerant to beta blockers due to bradycardia. She has preserved left ventricular systolic function but has had episodes of acute exacerbation of diastolic heart failure attributed to hypertensive heart disease. December 2015, she was critically ill with an acute left iliofemoral DVT with anticoagulation complicated by retroperitoneal hematoma and hemorrhagic shock, requiring placement of an inferior vena cava filter. The filter was removed in June 2016. Coronary angiography in July 2018 showed unchanged anatomy of the eccentric stenosis in the left main coronary artery with noncritical FFR (0.89 in the LAD, 0.86 in the LCx).  Intravascular Pressure Wire/FFR Study  Left Heart Cath and Coronary Angiography  Conclusion     LM lesion, 70 %stenosed. Mid LAD lesion, 0 %stenosed at site of prior stent. Ost Cx lesion, 60 %stenosed. Prox RCA-1 lesion, 20 %stenosed. Prox RCA-2 lesion, 30 %stenosed. The left ventricular systolic function is normal. LV end diastolic pressure is normal. The left ventricular ejection fraction  is 55-65% by visual estimate.   1. 70% eccentric distal left main stenosis. Angiographically similar to 2016. FFR 0.89 into the LAD and 0.86 into the LCx suggesting that this lesion is not flow limiting.  2. Otherwise nonobstructive CAD. Patent stent in LAD. 3. Normal LV function 4. Normal LVEDP   Plan: recommend continued medical  therapy.     Past Medical History:  Diagnosis Date   Arthritis    right knee   Cancer ([place]) yrs ago   skin cancer removed from face   Chronic diastolic CHF (congestive heart failure) (HCC)    Chronic venous insufficiency    LEA VENOUS, 10/17/2011 - mild reflux in bilateral common femoral veins   CKD (chronic kidney disease), stage III ([place])    Coronary artery disease    a. s/p PCI/BMS to prox LAD and balloon angioplasty to mLAD with suboptimal result in 2000. b. Abnl nuc 2012, cath 09/2011 - showed that the overall territory of potential ischemia was small and attributable to a moderately diseased small second diagonal artery. Med rx.   Dyspnea    with activity   Headache    History of blood transfusion 2017   Hyperlipidemia    Hypertension    Hypertensive heart disease    Hypothyroidism    Obesity    Pneumonia    several times   Stroke [facility])    pt. states she had "light stroke" in sept. 1980   Urinary incontinence     Past Surgical History:  Procedure Laterality Date   ABDOMINAL HYSTERECTOMY  1970's   complete   ANKLE ARTHROSCOPY Right 07/10/2017   Procedure: ANKLE ARTHROSCOPY;  Surgeon: [name], DPM;  Location: [facility];  Service: Podiatry;  Laterality: Right;   BACK SURGERY  07/26/2016   cervical neck surgery, [facility]   BALLOON DILATION N/A 12/01/2021   Procedure: BALLOON DILATION;  Surgeon: Carol Ada, MD;  [facility];  Service: Gastroenterology;  Laterality: N/A;   BLADDER SURGERY  2010   WITH MESH  bladder tach   bunion removal surgery Bilateral 15 yrs ago   CARDIAC CATHETERIZATION Left 09/25/2011   Medical management   CARDIAC CATHETERIZATION Left 03/25/2001   Normal LV function, LAD residual narrowing of less than 10%, normal ramus intermediate, circumflex, and RCA,    CARDIAC CATHETERIZATION  09/04/1999   LAD, 3x38m Tetra stent resulting in a reduction of the 80% stenosis to 0% residual   CARDIAC CATHETERIZATION N/A  01/26/2015   Procedure: Right/Left Heart Cath and Coronary Angiography;  Surgeon: TTroy Sine MD;  [facility];  Service: Cardiovascular;  Laterality: N/A;   CARDIAC CATHETERIZATION N/A 01/27/2015   Procedure: Intravascular Pressure Wire/FFR Study;  Surgeon: CBurnell Blanks MD;  [facility];  Service: Cardiovascular;  Laterality: N/A;   CARDIAC CATHETERIZATION N/A 01/27/2015   Procedure: Right Heart Cath;  Surgeon: CBurnell Blanks MD;  [facility];  Service: Cardiovascular;  Laterality: N/A;   CHOLECYSTECTOMY     COLONOSCOPY WITH PROPOFOL N/A 03/07/2017   Procedure: COLONOSCOPY WITH PROPOFOL;  Surgeon: MJuanita Craver MD;  [facility];  Service: Endoscopy;  Laterality: N/A;   CORONARY STENT PLACEMENT     LAD x 1   ESOPHAGOGASTRODUODENOSCOPY (EGD) WITH PROPOFOL N/A 12/01/2021   Procedure: ESOPHAGOGASTRODUODENOSCOPY (EGD) WITH PROPOFOL;  Surgeon: HCarol Ada MD;  [facility];  Service: Gastroenterology;  Laterality: N/A;   ganglion cyst removal  yrs ago   x  2   ILIAC VEIN ANGIOPLASTY / STENTING  02/15/2015   INTRAVASCULAR PRESSURE WIRE/FFR STUDY N/A 04/05/2017   Procedure: Intravascular Pressure Wire/FFR Study;  Surgeon: Martinique, Peter M, MD;  Location: [place] [facility];  Service: Cardiovascular;  Laterality: N/A;   IVC FILTER INSERTION  2016   IVC FILTER REMOVAL  02/15/2015   at [place] CATH AND CORONARY ANGIOGRAPHY N/A 04/05/2017   Procedure: Left Heart Cath and Coronary Angiography;  Surgeon: Martinique, Peter M, MD;  Location: [place] [facility];  Service: Cardiovascular;  Laterality: N/A;   LEFT HEART CATHETERIZATION WITH CORONARY ANGIOGRAM N/A 09/25/2011   Procedure: LEFT HEART CATHETERIZATION WITH CORONARY ANGIOGRAM;  Surgeon: Sanda Klein, MD;  Location: [place] [facility];  Service: Cardiovascular;  Laterality: N/A;   multiple bladder surgeries to remove mesh     ROTATOR CUFF REPAIR Right    stent  to groin Left 08/2014   left leg   TENDON REPAIR Right 07/10/2017   Procedure: RIGHT PERONEAL TENDON REPAIR;  Surgeon: [name], DPM;  Location: [place];  Service: Podiatry;  Laterality: Right;   TENDON REPAIR Right 05/27/2019   Procedure: PERONEAL TENDON REPAIR x2;  Surgeon: [name], DPM;  [facility];  Service: Podiatry;  Laterality: Right;    Outpatient Medications Prior to Visit  Medication Sig Dispense Refill   amLODipine (NORVASC) 2.5 MG tablet TAKE ONE TABLET BY MOUTH EVERY MORNING 90 tablet 3   cholecalciferol (VITAMIN D3) 25 MCG (1000 UNIT) tablet Take 1,000 Units by mouth daily.      ezetimibe (ZETIA) 10 MG tablet TAKE ONE TABLET BY MOUTH EVERY MORNING 90 tablet 3   isosorbide mononitrate (IMDUR) 30 MG 24 hr tablet Take 1 tablet (30 mg total) by mouth 2 (two) times daily. 180 tablet 3   ketoconazole (NIZORAL) 2 % shampoo Apply 1 application  topically daily as needed for irritation (or itching- SHAMPOO).     levothyroxine (SYNTHROID) 88 MCG tablet TAKE ONE TABLET BY MOUTH BEFORE BREAKFAST 90 tablet 3   pravastatin (PRAVACHOL) 80 MG tablet Take 80 mg by mouth at bedtime.     TYLENOL 8 HOUR ARTHRITIS PAIN 650 MG CR tablet Take 650 mg by mouth every 8 (eight) hours as needed for pain.     vitamin C (ASCORBIC ACID) 500 MG tablet Take 500 mg by mouth daily.     doxycycline (VIBRA-TABS) 100 MG tablet Take 1 tablet (100 mg total) by mouth every 12 (twelve) hours. 20 tablet 0   KLOR-CON M20 20 MEQ tablet Take 2 tablets (40 mEq total) by mouth 2 (two) times daily. 90 tablet 3   sertraline (ZOLOFT) 25 MG tablet Take 1 tablet (25 mg total) by mouth daily. 30 tablet 3   sulfamethoxazole-trimethoprim (BACTRIM DS) 800-160 MG tablet Take 1 tablet by mouth 2 (two) times daily. 10 tablet 0   torsemide (DEMADEX) 100 MG tablet For a weight of 195 lbs or less, take 50 mg (half a tablet) in the morning and 50 mg in the pm. For a weight of 196 lbs and higher, take 100 mg in the morning  and 50 mg in the pm 60 tablet 6   No facility-administered medications prior to visit.     Allergies:   Atorvastatin, Penicillins, Shellfish allergy, Aminophylline, Iodinated contrast media, Latex, Oxybutynin chloride, Propoxyphene, Vancomycin, Adhesive [tape], Betadine [povidone iodine], Ciprofloxacin, Codeine, and Ranolazine   Social History   Socioeconomic History   Marital status: Married    Spouse  name: Not on file   Number of children: Not on file   Years of education: Not on file   Highest education level: Not on file  Occupational History   Not on file  Tobacco Use   Smoking status: Never   Smokeless tobacco: Never  Vaping Use   Vaping Use: Never used  Substance and Sexual Activity   Alcohol use: No   Drug use: No   Sexual activity: Not on file  Other Topics Concern   Not on file  Social History Narrative   Per [facility] Patient Packet Abstracted on 03/06/2021      Diet: Left blank       Caffeine: No      Married, if yes what year: Yes, 1955      Do you live in a house, apartment, assisted living, condo, trailer, ect: House      Is it one or more stories: 1 story      How many persons live in your home? 2      Pets: No      Highest level or education completed: 12 th grade      Current/Past profession: Futures trader       Exercise:          Yes       Type and how often: Walking          Living Will: Yes   DNR: No   POA/HPOA: Yes      Functional Status:   Do you have difficulty bathing or dressing yourself? Left Blank    Do you have difficulty preparing food or eating?Left Blank   Do you have difficulty managing your medications?Left Blank   Do you have difficulty managing your finances?Left Blank   Do you have difficulty affording your medications?Left Blank   Social Determinants of Health   Financial Resource Strain: Not on file  Food Insecurity: No Food Insecurity (04/07/2020)   Hunger Vital Sign    Worried About Running Out of Food  in the Last Year: Never true    Ran Out of Food in the Last Year: Never true  Transportation Needs: No Transportation Needs (04/07/2020)   PRAPARE - Hydrologist (Medical): No    Lack of Transportation (Non-Medical): No  Physical Activity: Not on file  Stress: Not on file  Social Connections: Not on file     Family History:  The patient's family history includes Brain cancer in her brother; Cancer in her mother and sister; Diabetes in her brother; Heart attack in her brother; Heart disease in her brother, brother, brother, father, sister, and sister; High blood pressure in her brother, brother, and sister.   ROS:   Please see the history of present illness.    ROS All other systems are reviewed and are negative.   PHYSICAL EXAM:   VS:  BP 110/62 (BP Location: Left Arm, Patient Position: Sitting, Cuff Size: Large)   Pulse (!) 55   Ht 5' 5.5" (1.664 m)   Wt 186 lb 3.2 oz (84.5 kg)   SpO2 90%   BMI 30.51 kg/m      General: Alert, oriented x3, no distress, borderline obese Head: no evidence of trauma, PERRL, EOMI, no exophtalmos or lid lag, no myxedema, no xanthelasma; normal ears, nose and oropharynx Neck: normal jugular venous pulsations and no hepatojugular reflux; brisk carotid pulses without delay and no carotid bruits Chest: clear to auscultation, no signs of consolidation  by percussion or palpation, normal fremitus, symmetrical and full respiratory excursions Cardiovascular: normal position and quality of the apical impulse, regular rhythm, normal first and second heart sounds, no murmurs, rubs or gallops Abdomen: no tenderness or distention, no masses by palpation, no abnormal pulsatility or arterial bruits, normal bowel sounds, no hepatosplenomegaly Extremities: no clubbing, cyanosis or edema; 2+ radial, ulnar and brachial pulses bilaterally; 2+ right femoral, posterior tibial and dorsalis pedis pulses; 2+ left femoral, posterior tibial and dorsalis  pedis pulses; no subclavian or femoral bruits Neurological: grossly nonfocal Psych: Normal mood and affect     Wt Readings from Last 3 Encounters:  03/29/22 186 lb 3.2 oz (84.5 kg)  03/12/22 188 lb (85.3 kg)  03/08/22 188 lb (85.3 kg)      Studies/Labs Reviewed:   EKG:  EKG is not ordered today.  The most recent tracing from 03/29/2022 is personally reviewed, shows normal sinus rhythm and is a normal tracing. Recent Labs: 03/07/2022: TSH 1.02 03/08/2022: B Natriuretic Peptide 204.5; Magnesium 2.1 03/09/2022: ALT 27 03/12/2022: Hemoglobin 15.6; Platelets 122 03/21/2022: BUN 25; Creat 0.85; Potassium 3.8; Sodium 144   Lipid Panel    Component Value Date/Time   CHOL 139 03/07/2022 1014   CHOL 193 11/18/2017 0857   TRIG 82 03/07/2022 1014   HDL 71 03/07/2022 1014   HDL 47 11/18/2017 0857   CHOLHDL 2.0 03/07/2022 1014   VLDL 48 (H) 06/27/2015 1336   LDLCALC 52 03/07/2022 1014      Labs 03/23/2020 Total cholesterol 145, HDL 55, LDL 69, triglycerides 118 TSH 0.886  09/21/2020 Total cholesterol 122, HDL 56, LDL 47, triglycerides 101  09/05/2021 Cholesterol 136, HDL 69, LDL 51, triglycerides 78  03/07/2022 Cholesterol 139, HDL 71, LDL 52, triglycerides 82   CATH 04/05/2017  LM lesion, 70 %stenosed. Mid LAD lesion, 0 %stenosed at site of prior stent. Ost Cx lesion, 60 %stenosed. Prox RCA-1 lesion, 20 %stenosed. Prox RCA-2 lesion, 30 %stenosed. The left ventricular systolic function is normal. LV end diastolic pressure is normal. The left ventricular ejection fraction is 55-65% by visual estimate.   1. 70% eccentric distal left main stenosis. Angiographically similar to 2016. FFR 0.89 into the LAD and 0.86 into the LCx suggesting that this lesion is not flow limiting.  2. Otherwise nonobstructive CAD. Patent stent in LAD. 3. Normal LV function 4. Normal LVEDP   Plan: recommend continued medical therapy.    ASSESSMENT:    1. Chronic diastolic heart failure ([place])    2. Coronary artery disease of native artery of native heart with stable angina pectoris ([place])   3. Essential hypertension   4. PAD (peripheral artery disease) ([place])   5. Hypercholesterolemia   6. Mild obesity       PLAN:  In order of problems listed above:  CHF: It appears that Mrs. Voytko has lost quite a bit of "real weight" and we have to reestablish her "dry weight" at about 180 lb on her home scale, 185 pounds on the office scale.  Previous attempts at aggressive diuresis using metolazone has led to acute kidney injury on a couple of occasions.  Recently hospitalized for severe hypokalemia.  Avoid sodium rich foods and NSAIDs.  Add low-dose spironolactone and reduce the dose of amlodipine to avoid hypotension. CAD: Has chronic atypical chest pressure that does not appear to be exertional.   She has a known moderate stenosis in the left coronary artery that was not significant by FFR performed as recently as 2018.  Nuclear stress  testing would not be useful to evaluate this due to the likelihood of balanced ischemia.  If necessary, would follow this up with coronary CT angiography.  However I do not think she would be a candidate for bypass surgery anymore, with advancing age and worsening functional status.  She does not tolerate beta-blockers due to bradycardia and ranolazine caused severe constipation. HTN: Very well controlled.  Note the blood pressure needs to be checked only in the right arm since she has mild left subclavian artery stenosis.  Adding spironolactone, so cutting down the dose of amlodipine PAD: Asymptomatic.  Known left subclavian stenosis, without subclavian steal syndrome or claudication. Hyperlipidemia: On current treatment all parameters are in target range.  Continue. Obesity: She is lost some weight and is almost out of the obesity range. Neurological complaints: She had some complaints that might have represented a TIA earlier this year, but I wonder whether they  were more related to anxiety and frustration.  No recent problems to suggest TIA or stroke.  Repeat carotid duplex ultrasound performed 11/03/2021 shows widely patent carotids and antegrade flow in the vertebral arteries bilaterally, without significant plaque leaving arteries were not mentioned, but on their evaluation at that time the blood pressure was actually little higher on the left than on the right.   Medication Adjustments/Labs and Tests Ordered: Current medicines are reviewed at length with the patient today.  Concerns regarding medicines are outlined above.  Medication changes, Labs and Tests ordered today are listed in the Patient Instructions below. Patient Instructions  Medication Instructions:  START Spironolactone 25 mg once daily  TAKE Amlodipine 2.5 mg once daily  *If you need a refill on your cardiac medications before your next appointment, please call your pharmacy*   Lab Work: Your provider would like for you to return in 3-4 weeks to have the following labs drawn: BMET. You do not need an appointment for the lab. Once in our office lobby there is a podium where you can sign in and ring the doorbell to alert [place] that you are here. The lab is open from 8:00 am to 4 pm; closed for lunch from 12:45pm-1:45pm.  You may also go to any of these [facility] locations:   [place] ([place]) - [place] [address] [place] [address] [place] - [address] ([facility]  If you have labs (blood work) drawn today and your tests are completely normal, you will receive your results only by: [employer] (if you have MyChart) OR A paper copy in the mail If you have any lab test that is abnormal or we need to change  your treatment, we will call you to review the results.   Testing/Procedures: None ordered   Follow-Up: At [facility], you and your health needs are our priority.  As part of our continuing mission to provide you with exceptional heart care, we have created designated Provider Care Teams.  These Care Teams include your primary Cardiologist (physician) and Advanced Practice Providers (APPs -  Physician Assistants and Nurse Practitioners) who all work together to provide you with the care you need, when you need it.  We recommend signing up for  the patient portal called "MyChart".  Sign up information is provided on this After Visit Summary.  MyChart is used to connect with patients for Virtual Visits (Telemedicine).  Patients are able to view lab/test results, encounter notes, upcoming appointments, etc.  Non-urgent messages can be sent to your provider as well.   To learn more about what you can do with MyChart, go to [email or web address].    Your next appointment:   3 month(s)  The format for your next appointment:   In Person  Provider:   Sanda Klein, MD or APP   Important Information About Sugar            Signed, Sanda Klein, MD  04/03/2022 11:50 PM    [facility] [place], [place], [place]  [address] Phone: [phone]; Fax: [phone]

## 2022-04-02 ENCOUNTER — Telehealth: Payer: Self-pay | Admitting: Cardiovascular Disease

## 2022-04-02 NOTE — Telephone Encounter (Signed)
Pt c/o swelling: STAT is pt has developed SOB within 24 hours  How much weight have you gained and in what time span?  6-7 lbs since 7/13, per patient. Patient assumes it is due to going on Spironolactone 25 MG tablet  If swelling, where is the swelling located?  Hands and legs  Are you currently taking a fluid pill?  Yes  Are you currently SOB?  No   Do you have a log of your daily weights (if so, list)?  7/16: 188 lbs 7/17: 187 lbs  Have you gained 3 pounds in a day or 5 pounds in a week?   Have you traveled recently?

## 2022-04-02 NOTE — Telephone Encounter (Signed)
Returned the call to the patient. She was last seen in the office on 7/13 and was started on spironolactone 25 mg once daily. Her home weight that morning was 182 lbs. She stated that she has been keeping her weight between 180 and 183.  Since the office visit, her weight has gone up to 188 lbs. She denies shortness of breath and stated that she only has the bilateral edema.   Most of the swelling goes down overnight. She elevates her legs during the day when she can. She denies extra sodium in her diet.   She took an extra 50 mg Torsemide last night and was down to 187 lbs this morning. She normally takes Torsemide 50 mg bid.

## 2022-04-03 MED ORDER — TORSEMIDE 100 MG PO TABS: ORAL_TABLET | ORAL | 6 refills | Status: DC

## 2022-04-03 NOTE — Telephone Encounter (Signed)
Patient has been made aware to try the Torsemide 100 mg in the am and 50 mg in the afternoon.  She will call back with an update.

## 2022-04-04 DIAGNOSIS — M19071 Primary osteoarthritis, right ankle and foot: Secondary | ICD-10-CM | POA: Diagnosis not present

## 2022-04-09 ENCOUNTER — Encounter: Payer: HMO | Admitting: Family

## 2022-04-10 ENCOUNTER — Ambulatory Visit (INDEPENDENT_AMBULATORY_CARE_PROVIDER_SITE_OTHER): Payer: HMO | Admitting: Family

## 2022-04-10 ENCOUNTER — Encounter: Payer: Self-pay | Admitting: Family

## 2022-04-10 VITALS — BP 150/88 | HR 69 | Temp 97.6°F | Resp 16 | Ht 65.5 in | Wt 195.8 lb

## 2022-04-10 DIAGNOSIS — E876 Hypokalemia: Secondary | ICD-10-CM

## 2022-04-10 DIAGNOSIS — J3489 Other specified disorders of nose and nasal sinuses: Secondary | ICD-10-CM

## 2022-04-10 DIAGNOSIS — F411 Generalized anxiety disorder: Secondary | ICD-10-CM

## 2022-04-10 LAB — BASIC METABOLIC PANEL WITH GFR
BUN: 9 mg/dL (ref 7–25)
CO2: 31 mmol/L (ref 20–32)
Calcium: 9.4 mg/dL (ref 8.6–10.4)
Chloride: 105 mmol/L (ref 98–110)
Creat: 0.77 mg/dL (ref 0.60–0.95)
Glucose, Bld: 98 mg/dL (ref 65–99)
Potassium: 5.2 mmol/L (ref 3.5–5.3)
Sodium: 140 mmol/L (ref 135–146)
eGFR: 75 mL/min/{1.73_m2} (ref >=60)

## 2022-04-10 MED ORDER — CETIRIZINE HCL 5 MG PO TABS: 5.0000 mg | ORAL_TABLET | Freq: Every day | ORAL | 0 refills | Status: DC

## 2022-04-10 NOTE — Progress Notes (Signed)
Provider: Marlowe Sax FNP-C  Tannor Pyon, [name], NP  Patient Care Team: Tequilla Cousineau, [name], NP as PCP - General (Family Medicine) Croitoru, Dani Gobble, MD as PCP - Cardiology (Cardiology) Sanda Klein, MD as Consulting Physician (Cardiology) Druscilla Brownie, MD as Referring Physician (Dermatology) [name], DPM (Inactive) as Consulting Physician (Podiatry) [name], [name], Edwards (Optometry) [name], MD as Referring Physician (Gynecology)  Extended Emergency Contact Information Primary Emergency Contact: Eastham,Lewis "Meribeth Mattes [place] Phone: [phone] Mobile Phone: [phone] Relation: Son Secondary Emergency Contact: [facility] Phone: [phone] Mobile Phone: [phone] Relation: Friend  Code Status:  Full Code  Goals of care: Advanced Directive information    04/10/2022   10:22 AM  Advanced Directives  Does Patient Have a Medical Advance Directive? Yes  Type of Advance Directive Out of facility DNR (pink MOST or yellow form)  Does patient want to make changes to medical advance directive? No - Patient declined     Chief Complaint  Patient presents with   Acute Visit    One month follow up on anxiety.    HPI:  Pt is a 88 y.o. female seen today for an acute visit for follow up anxiety.she was started on sertraline 25 mg tablet daily but states she took one pill which made her feel bad so she stopped.  Stated feeling better than she has felt in the past " feeling like myself".  States woke up today if just feeling like sitting and has not sung with long time" till the storm passes over ".   She complains of feeling like pressure in right ear.  Describes it as [place]" sometimes tries to cough or clear her throat without any relief.  She puts her finger in her ear and shakes it.Opening her mouth wide sometimes helps.  Here to recheck her potassium level too.Latest lab had  improved compared to last hospital admission.    Past Medical History:  Diagnosis Date   Arthritis    right knee   Cancer ([place]) yrs ago   skin cancer removed from face   Chronic diastolic CHF (congestive heart failure) (HCC)    Chronic venous insufficiency    LEA VENOUS, 10/17/2011 - mild reflux in bilateral common femoral veins   CKD (chronic kidney disease), stage III ([place])    Coronary artery disease    a. s/p PCI/BMS to prox LAD and balloon angioplasty to mLAD with suboptimal result in 2000. b. Abnl nuc 2012, cath 09/2011 - showed that the overall territory of potential ischemia was small and attributable to a moderately diseased small second diagonal artery. Med rx.   Dyspnea    with activity   Headache    History of blood transfusion 2017   Hyperlipidemia    Hypertension    Hypertensive heart disease    Hypothyroidism    Obesity    Pneumonia    several times   Stroke [facility])    pt. states she had "light stroke" in sept. 1980   Urinary incontinence    Past Surgical History:  Procedure Laterality Date   ABDOMINAL HYSTERECTOMY  1970's   complete   ANKLE ARTHROSCOPY Right 07/10/2017   Procedure: ANKLE ARTHROSCOPY;  Surgeon: [name], DPM;  Location: [facility];  Service: Podiatry;  Laterality: Right;   BACK SURGERY  07/26/2016   cervical neck surgery, [facility]   BALLOON DILATION N/A 12/01/2021  Procedure: BALLOON DILATION;  Surgeon: Carol Ada, MD;  Location: [facility];  Service: Gastroenterology;  Laterality: N/A;   BLADDER SURGERY  2010   WITH MESH  bladder tach   bunion removal surgery Bilateral 15 yrs ago   CARDIAC CATHETERIZATION Left 09/25/2011   Medical management   CARDIAC CATHETERIZATION Left 03/25/2001   Normal LV function, LAD residual narrowing of less than 10%, normal ramus intermediate, circumflex, and RCA,    CARDIAC CATHETERIZATION  09/04/1999   LAD, 3x19m Tetra stent resulting in a reduction of the 80% stenosis to 0% residual    CARDIAC CATHETERIZATION N/A 01/26/2015   Procedure: Right/Left Heart Cath and Coronary Angiography;  Surgeon: TTroy Sine MD;  [place];  Service: Cardiovascular;  Laterality: N/A;   CARDIAC CATHETERIZATION N/A 01/27/2015   Procedure: Intravascular Pressure Wire/FFR Study;  Surgeon: CBurnell Blanks MD;  [facility];  Service: Cardiovascular;  Laterality: N/A;   CARDIAC CATHETERIZATION N/A 01/27/2015   Procedure: Right Heart Cath;  Surgeon: CBurnell Blanks MD;  [place];  Service: Cardiovascular;  Laterality: N/A;   CHOLECYSTECTOMY     COLONOSCOPY WITH PROPOFOL N/A 03/07/2017   Procedure: COLONOSCOPY WITH PROPOFOL;  Surgeon: MJuanita Craver MD;  Location: [facility];  Service: Endoscopy;  Laterality: N/A;   CORONARY STENT PLACEMENT     LAD x 1   ESOPHAGOGASTRODUODENOSCOPY (EGD) WITH PROPOFOL N/A 12/01/2021   Procedure: ESOPHAGOGASTRODUODENOSCOPY (EGD) WITH PROPOFOL;  Surgeon: HCarol Ada MD;  Location: [facility];  Service: Gastroenterology;  Laterality: N/A;   ganglion cyst removal  yrs ago   x 2   ILIAC VEIN ANGIOPLASTY / STENTING  02/15/2015   INTRAVASCULAR PRESSURE WIRE/FFR STUDY N/A 04/05/2017   Procedure: Intravascular Pressure Wire/FFR Study;  Surgeon: JMartinique Peter M, MD;  [place] [facility];  Service: Cardiovascular;  Laterality: N/A;   IVC FILTER INSERTION  2016   IVC [place]OV[place]  02/15/2015   [place]/A 04/05/2017   Procedure: Left Heart Cath and Coronary Angiography;  Surgeon: JMartinique Peter M, MD;  [place];  Service: Cardiovascular;  Laterality: N/A;   LEFT HEART CATHETERIZATION WITH CORONARY ANGIOGRAM N/A 09/25/2011   Procedure: LEFT HEART CATHETERIZATION WITH CORONARY ANGIOGRAM;  Surgeon: MSanda Klein MD;  [facility];  Service: Cardiovascular;  Laterality: N/A;   multiple bladder surgeries to remove mesh     ROTATOR  CUFF REPAIR Right    stent to groin Left 08/2014   left leg   TENDON REPAIR Right 07/10/2017   Procedure: RIGHT PERONEAL TENDON REPAIR;  Surgeon: PEvelina Bucy DPM;  [place] [facility]  Service: Podiatry;  Laterality: Right;   TENDON REPAIR Right 05/27/2019   Procedure: PERONEAL TENDON REPAIR x2;  Surgeon: PEvelina Bucy DPM;  [facility];  Service: Podiatry;  Laterality: Right;    Allergies  Allergen Reactions   Atorvastatin Anaphylaxis and Other (See Comments)    Patient does not remember; caused pneumonia- took her off of it per patient   Penicillins Anaphylaxis, Hives, Swelling and Other (See Comments)    Tongue swelling Did it involve swelling of the face/tongue/throat, SOB, or low BP? Yes Did it involve sudden or severe rash/hives, skin peeling, or any reaction on the inside of your mouth or nose? Yes Did you need to seek medical attention at a hospital or doctor's office? Yes When did it last happen?  22 or  88 years old    If all above answers are "NO", may proceed with cephalosporin use.    Shellfish Allergy Anaphylaxis, Nausea And Vomiting and Other (See Comments)    Severe nausea and vomiting   Aminophylline Itching, Swelling, Rash and Other (See Comments)    Pt experienced burning urination, itching, redness/rash, and swelling of genitals after given this medication via IV push.   Iodinated Contrast Media Swelling and Other (See Comments)    FLUSHING, also; LLE became swollen   Latex Other (See Comments)    Causes blisters   Oxybutynin Chloride Other (See Comments)    "blisters"   Propoxyphene Other (See Comments)    Unknown reaction   Vancomycin Hives and Other (See Comments)    06/02/2018- Hives arm, back and chest   Adhesive [Tape] Rash and Other (See Comments)    Paper tape is ok   Betadine [Povidone Iodine] Rash   Ciprofloxacin Hives   Codeine Nausea And Vomiting    .   Ranolazine Other (See Comments)    Constipation  .    Outpatient Encounter  Medications as of 04/10/2022  Medication Sig   amLODipine (NORVASC) 2.5 MG tablet TAKE ONE TABLET BY MOUTH EVERY MORNING   cholecalciferol (VITAMIN D3) 25 MCG (1000 UNIT) tablet Take 1,000 Units by mouth daily.    ezetimibe (ZETIA) 10 MG tablet TAKE ONE TABLET BY MOUTH EVERY MORNING   isosorbide mononitrate (IMDUR) 30 MG 24 hr tablet Take 1 tablet (30 mg total) by mouth 2 (two) times daily.   ketoconazole (NIZORAL) 2 % shampoo Apply 1 application  topically daily as needed for irritation (or itching- SHAMPOO).   KLOR-CON M20 20 MEQ tablet Take 1 tablet (20 mEq total) by mouth 2 (two) times daily.   levothyroxine (SYNTHROID) 88 MCG tablet TAKE ONE TABLET BY MOUTH BEFORE BREAKFAST   pravastatin (PRAVACHOL) 80 MG tablet Take 80 mg by mouth at bedtime.   spironolactone (ALDACTONE) 25 MG tablet Take 1 tablet (25 mg total) by mouth daily.   torsemide (DEMADEX) 100 MG tablet For a weight of 195 lbs or less, take 50 mg (half a tablet) in the morning and 50 mg in the pm. For a weight of 196 lbs and higher, take 100 mg in the morning and 50 mg in the pm   TYLENOL 8 HOUR ARTHRITIS PAIN 650 MG CR tablet Take 650 mg by mouth every 8 (eight) hours as needed for pain.   vitamin C (ASCORBIC ACID) 500 MG tablet Take 500 mg by mouth daily.   No facility-administered encounter medications on file as of 04/10/2022.    Review of Systems  Constitutional:  Negative for appetite change, chills, fatigue, fever and unexpected weight change.  HENT:  Positive for congestion, hearing loss and sinus pressure. Negative for dental problem, ear discharge, ear pain, nosebleeds, postnasal drip, rhinorrhea, sinus pain, sneezing, sore throat, tinnitus and trouble swallowing.        Ear pressure  Eyes:  Negative for pain, discharge, redness, itching and visual disturbance.  Respiratory:  Negative for cough, chest tightness, shortness of breath and wheezing.   Cardiovascular:  Negative for chest pain, palpitations and leg swelling.   Gastrointestinal:  Negative for abdominal distention, abdominal pain, nausea and vomiting.  Musculoskeletal:  Positive for arthralgias and gait problem. Negative for back pain, joint swelling, myalgias, neck pain and neck stiffness.       Right leg brace   Skin:  Negative for color change, pallor and rash.  Neurological:  Negative for dizziness, weakness, light-headedness and headaches.  Hematological:  Does not bruise/bleed easily.  Psychiatric/Behavioral:  Negative for agitation, behavioral problems, confusion, hallucinations, self-injury, sleep disturbance and suicidal ideas. The patient is not nervous/anxious.        Anxiety symptoms have improved    Immunization History  Administered Date(s) Administered   PFIZER(Purple Top)SARS-COV-2 Vaccination 11/11/2019, 11/13/2019, 12/08/2019   Pneumococcal Conjugate-13 06/14/2014   Pneumococcal Polysaccharide-23 10/26/2013, 03/30/2020   Tdap 12/19/2015   Pertinent  Health Maintenance Due  Topic Date Due   INFLUENZA VACCINE  04/17/2022   DEXA SCAN  Completed      03/09/2022    8:00 AM 03/09/2022    9:00 PM 03/10/2022    8:00 AM 03/12/2022    8:46 AM 04/10/2022   10:22 AM  Fall Risk  Falls in the past year?    0 0  Was there an injury with Fall?    0 0  Fall Risk Category Calculator    0 0  Fall Risk Category    Low Low  Patient Fall Risk Level High fall risk High fall risk High fall risk Moderate fall risk Low fall risk  Patient at Risk for Falls Due to    History of fall(s) No Fall Risks  Fall risk Follow up    Falls evaluation completed Falls evaluation completed   Functional Status Survey:    Vitals:   04/10/22 1017  BP: (!) 150/88  Pulse: 69  Resp: 16  Temp: 97.6 F (36.4 C)  SpO2: 97%  Weight: 195 lb 12.8 oz (88.8 kg)  Height: 5' 5.5" (1.664 m)   Body mass index is 32.09 kg/m. Physical Exam Vitals reviewed.  Constitutional:      General: She is not in acute distress.    Appearance: Normal appearance. She is normal  weight. She is not ill-appearing or diaphoretic.  HENT:     Head: Normocephalic.     Right Ear: Tympanic membrane, ear canal and external ear normal. There is no impacted cerumen.     Left Ear: Tympanic membrane, ear canal and external ear normal. There is no impacted cerumen.     Nose: Rhinorrhea present. No congestion.     Mouth/Throat:     Mouth: Mucous membranes are moist.     Pharynx: Oropharynx is clear. No oropharyngeal exudate or posterior oropharyngeal erythema.  Eyes:     General: No scleral icterus.       Right eye: No discharge.        Left eye: No discharge.     Extraocular Movements: Extraocular movements intact.     Conjunctiva/sclera: Conjunctivae normal.     Pupils: Pupils are equal, round, and reactive to light.  Cardiovascular:     Rate and Rhythm: Normal rate and regular rhythm.     Heart sounds: Normal heart sounds. No murmur heard.    No friction rub. No gallop.  Pulmonary:     Effort: Pulmonary effort is normal. No respiratory distress.     Breath sounds: Normal breath sounds. No wheezing, rhonchi or rales.  Chest:     Chest wall: No tenderness.  Abdominal:     General: Bowel sounds are normal. There is no distension.     Palpations: Abdomen is soft. There is no mass.     Tenderness: There is no abdominal tenderness. There is no right CVA tenderness, left CVA tenderness, guarding or rebound.  Musculoskeletal:        General: No swelling or tenderness.  Left lower leg: No edema.     Comments: Right ankle brace in place   Skin:    General: Skin is warm and dry.     Coloration: Skin is not pale.     Findings: No erythema or rash.  Neurological:     Mental Status: She is alert and oriented to person, place, and time.     Motor: No weakness.     Gait: Gait abnormal.  Psychiatric:        Mood and Affect: Mood normal.        Speech: Speech normal.        Behavior: Behavior normal.        Thought Content: Thought content normal.        Judgment:  Judgment normal.    Labs reviewed: Recent Labs    03/08/22 1416 03/09/22 0439 03/10/22 0444 03/10/22 1247 03/12/22 0932 03/21/22 0854  NA 137   < > 138  --  138 144  K 2.2*   < > 3.2* 4.0 4.0 3.8  CL 89*   < > 101  --  100 102  CO2 37*   < > 29  --  29 30  GLUCOSE 111*   < > 117*  --  90 94  BUN 40*   < > 21  --  21 25  CREATININE 1.17*   < > 0.73  --  1.10* 0.85  CALCIUM 10.4*   < > 8.9  --  9.6 9.2  MG 2.1  --   --   --   --   --    < > = values in this interval not displayed.   Recent Labs    04/14/21 1541 03/07/22 1014 03/08/22 1416 03/09/22 0439  AST 41 53* 51* 42*  ALT 24 28 32 27  ALKPHOS 90  --  72 57  BILITOT 1.0 1.7* 1.5* 1.1  PROT 6.8 6.5 7.2 5.7*  ALBUMIN 3.6  --  4.1 2.9*   Recent Labs    03/07/22 1014 03/08/22 1416 03/09/22 0439 03/12/22 0932  WBC 5.7 5.7 5.0 5.2  NEUTROABS 3,642 3.7  --  3,058  HGB 16.0* 16.1* 14.4 15.6*  HCT 46.1* 47.0* 42.3 45.6*  MCV 96.2 96.1 97.9 98.5  PLT 129* 113* 90* 122*   Lab Results  Component Value Date   TSH 1.02 03/07/2022   Lab Results  Component Value Date   HGBA1C 6.1 (H) 01/26/2015   Lab Results  Component Value Date   CHOL 139 03/07/2022   HDL 71 03/07/2022   LDLCALC 52 03/07/2022   TRIG 82 03/07/2022   CHOLHDL 2.0 03/07/2022    Significant Diagnostic Results in last 30 days:  No results found.  Assessment/Plan 1. Generalized anxiety disorder -Symptoms have improved.  Took sertraline x1 then stopped because it made her feel bad.  Does not want to take any other medications since she is feeling good.  She is in  very good spirits today.  2. Nasal drainage Chronic Cetirizine samples given for 14 days - cetirizine (ZYRTEC) 5 MG tablet; Take 1 tablet (5 mg total) by mouth daily for 14 days.  Dispense: 14 tablet; Refill: 0  3. Hypokalemia Had previous low potassium but latest had improved.We will recheck potassium levels today - BMP with eGFR([facility])  Family/ staff Communication: Reviewed  plan of care with patient verbalized understanding  Labs/tests ordered: - BMP with eGFR([facility])  Next Appointment: Has appointment in place  Sandrea Hughs, NP

## 2022-04-17 DIAGNOSIS — H40013 Open angle with borderline findings, low risk, bilateral: Secondary | ICD-10-CM | POA: Diagnosis not present

## 2022-04-17 DIAGNOSIS — H25811 Combined forms of age-related cataract, right eye: Secondary | ICD-10-CM | POA: Diagnosis not present

## 2022-04-23 NOTE — Progress Notes (Signed)
  This encounter was created in error - please disregard. No show 

## 2022-04-25 DIAGNOSIS — N1831 Chronic kidney disease, stage 3a: Secondary | ICD-10-CM | POA: Diagnosis not present

## 2022-04-25 DIAGNOSIS — R531 Weakness: Secondary | ICD-10-CM | POA: Diagnosis not present

## 2022-04-25 DIAGNOSIS — I5032 Chronic diastolic (congestive) heart failure: Secondary | ICD-10-CM | POA: Diagnosis not present

## 2022-04-25 DIAGNOSIS — Z792 Long term (current) use of antibiotics: Secondary | ICD-10-CM | POA: Diagnosis not present

## 2022-04-25 DIAGNOSIS — I2510 Atherosclerotic heart disease of native coronary artery without angina pectoris: Secondary | ICD-10-CM | POA: Diagnosis not present

## 2022-04-25 DIAGNOSIS — I495 Sick sinus syndrome: Secondary | ICD-10-CM | POA: Diagnosis not present

## 2022-04-25 DIAGNOSIS — N390 Urinary tract infection, site not specified: Secondary | ICD-10-CM | POA: Diagnosis not present

## 2022-04-25 DIAGNOSIS — I110 Hypertensive heart disease with heart failure: Secondary | ICD-10-CM | POA: Diagnosis not present

## 2022-04-25 DIAGNOSIS — I872 Venous insufficiency (chronic) (peripheral): Secondary | ICD-10-CM | POA: Diagnosis not present

## 2022-04-25 DIAGNOSIS — I739 Peripheral vascular disease, unspecified: Secondary | ICD-10-CM | POA: Diagnosis not present

## 2022-04-26 DIAGNOSIS — H2511 Age-related nuclear cataract, right eye: Secondary | ICD-10-CM | POA: Diagnosis not present

## 2022-05-02 ENCOUNTER — Telehealth: Payer: Self-pay

## 2022-05-02 NOTE — Telephone Encounter (Signed)
Patient called stating that she has fever blisters on mouth both upper and lower. She has been using carmex,but doesn't seem to be helping. Wants to know if there is anything else she can use or something that can be sent to the pharmacy.  Message routed to Marlowe Sax, NP

## 2022-05-03 NOTE — Telephone Encounter (Signed)
Recommend Abreva ex cream one application twice daily as needed for cold sore.

## 2022-05-03 NOTE — Telephone Encounter (Signed)
Spoke with patient. She was informed of recommendations and verbalized her understanding and agreed.

## 2022-05-04 DIAGNOSIS — I5032 Chronic diastolic (congestive) heart failure: Secondary | ICD-10-CM | POA: Diagnosis not present

## 2022-05-05 LAB — BASIC METABOLIC PANEL
BUN/Creatinine Ratio: 17 (ref 12–28)
BUN: 15 mg/dL (ref 8–27)
CO2: 28 mmol/L (ref 20–29)
Calcium: 9.5 mg/dL (ref 8.7–10.3)
Chloride: 100 mmol/L (ref 96–106)
Creatinine, Ser: 0.88 mg/dL (ref 0.57–1.00)
Glucose: 94 mg/dL (ref 70–99)
Potassium: 4.2 mmol/L (ref 3.5–5.2)
Sodium: 143 mmol/L (ref 134–144)
eGFR: 64 mL/min/{1.73_m2} (ref >59)

## 2022-05-07 ENCOUNTER — Encounter: Payer: Self-pay | Admitting: *Deleted

## 2022-05-08 DIAGNOSIS — H2512 Age-related nuclear cataract, left eye: Secondary | ICD-10-CM | POA: Diagnosis not present

## 2022-05-09 DIAGNOSIS — M7061 Trochanteric bursitis, right hip: Secondary | ICD-10-CM | POA: Diagnosis not present

## 2022-05-10 DIAGNOSIS — H2512 Age-related nuclear cataract, left eye: Secondary | ICD-10-CM | POA: Diagnosis not present

## 2022-05-10 DIAGNOSIS — H25812 Combined forms of age-related cataract, left eye: Secondary | ICD-10-CM | POA: Diagnosis not present

## 2022-05-14 DIAGNOSIS — Z1231 Encounter for screening mammogram for malignant neoplasm of breast: Secondary | ICD-10-CM | POA: Diagnosis not present

## 2022-05-29 DIAGNOSIS — D696 Thrombocytopenia, unspecified: Secondary | ICD-10-CM | POA: Diagnosis not present

## 2022-05-29 DIAGNOSIS — Z86010 Personal history of colonic polyps: Secondary | ICD-10-CM | POA: Diagnosis not present

## 2022-05-29 DIAGNOSIS — K7460 Unspecified cirrhosis of liver: Secondary | ICD-10-CM | POA: Diagnosis not present

## 2022-05-29 DIAGNOSIS — K5730 Diverticulosis of large intestine without perforation or abscess without bleeding: Secondary | ICD-10-CM | POA: Diagnosis not present

## 2022-05-29 DIAGNOSIS — R1011 Right upper quadrant pain: Secondary | ICD-10-CM | POA: Diagnosis not present

## 2022-05-30 ENCOUNTER — Other Ambulatory Visit: Payer: Self-pay | Admitting: Gastroenterology

## 2022-05-30 DIAGNOSIS — Z8719 Personal history of other diseases of the digestive system: Secondary | ICD-10-CM

## 2022-06-06 ENCOUNTER — Ambulatory Visit
Admission: RE | Admit: 2022-06-06 | Discharge: 2022-06-06 | Disposition: A | Discharge status: Home / Self Care | Payer: HMO | Source: Ambulatory Visit | Attending: Gastroenterology | Admitting: Gastroenterology

## 2022-06-06 DIAGNOSIS — K7460 Unspecified cirrhosis of liver: Secondary | ICD-10-CM | POA: Diagnosis not present

## 2022-06-06 DIAGNOSIS — Z8719 Personal history of other diseases of the digestive system: Secondary | ICD-10-CM

## 2022-06-06 DIAGNOSIS — Z9049 Acquired absence of other specified parts of digestive tract: Secondary | ICD-10-CM | POA: Diagnosis not present

## 2022-06-13 ENCOUNTER — Encounter: Payer: Self-pay | Admitting: Physician Assistant

## 2022-06-21 ENCOUNTER — Encounter: Payer: Self-pay | Admitting: Family

## 2022-06-21 ENCOUNTER — Ambulatory Visit (INDEPENDENT_AMBULATORY_CARE_PROVIDER_SITE_OTHER): Payer: HMO | Admitting: Family

## 2022-06-21 DIAGNOSIS — Z0000 Encounter for general adult medical examination without abnormal findings: Secondary | ICD-10-CM

## 2022-06-21 NOTE — Patient Instructions (Signed)
[name] , Thank you for taking time to come for your Medicare Wellness Visit. I appreciate your ongoing commitment to your health goals. Please review the following plan we discussed and let me know if I can assist you in the future.   Screening recommendations/referrals: Colonoscopy : N/A  Mammogram : Up to date  Bone Density : Due  Recommended yearly ophthalmology/optometry visit for glaucoma screening and checkup Recommended yearly dental visit for hygiene and checkup  Vaccinations: Influenza vaccine- due annually in September/October Pneumococcal vaccine : Up to date  Tdap vaccine : Up to date  Shingles vaccine : Declined     Advanced directives: yes   Conditions/risks identified: advanced age (>45mn, >>26women);hypertension;sedentary lifestyle;obesity (BMI >30kg/m2)  Next appointment: 1 year    Preventive Care 652Years and Older, Female Preventive care refers to lifestyle choices and visits with your health care provider that can promote health and wellness. What does preventive care include? A yearly physical exam. This is also called an annual well check. Dental exams once or twice a year. Routine eye exams. Ask your health care provider how often you should have your eyes checked. Personal lifestyle choices, including: Daily care of your teeth and gums. Regular physical activity. Eating a healthy diet. Avoiding tobacco and drug use. Limiting alcohol use. Practicing safe sex. Taking low-dose aspirin every day. Taking vitamin and mineral supplements as recommended by your health care provider. What happens during an annual well check? The services and screenings done by your health care provider during your annual well check will depend on your age, overall health, lifestyle risk factors, and family history of disease. Counseling  Your health care provider may ask you questions about your: Alcohol use. Tobacco use. Drug use. Emotional well-being. Home and  relationship well-being. Sexual activity. Eating habits. History of falls. Memory and ability to understand (cognition). Work and work [employer] health. Screening  You may have the following tests or measurements: Height, weight, and BMI. Blood pressure. Lipid and cholesterol levels. These may be checked every 5 years, or more frequently if you are over 559years old. Skin check. Lung cancer screening. You may have this screening every year starting at age [address]if you have a 30-pack-year history of smoking and currently smoke or have quit within the past 15 years. Fecal occult blood test (FOBT) of the stool. You may have this test every year starting at age 50076 Flexible sigmoidoscopy or colonoscopy. You may have a sigmoidoscopy every 5 years or a colonoscopy every 10 years starting at age 50037 Hepatitis C blood test. Hepatitis B blood test. Sexually transmitted disease (STD) testing. Diabetes screening. This is done by checking your blood sugar (glucose) after you have not eaten for a while (fasting). You may have this done every 1-3 years. Bone density scan. This is done to screen for osteoporosis. You may have this done starting at age 86 Mammogram. This may be done every 1-2 years. Talk to your health care provider about how often you should have regular mammograms. Talk with your health care provider about your test results, treatment options, and if necessary, the need for more tests. Vaccines  Your health care provider may recommend certain vaccines, such as: Influenza vaccine. This is recommended every year. Tetanus, diphtheria, and acellular pertussis (Tdap, Td) vaccine. You may need a Td booster every 10 years. Zoster vaccine. You may need this after age 86 Pneumococcal 13-valent conjugate (PCV13) vaccine. One dose is recommended after age 86 Pneumococcal polysaccharide (PPSV23)  vaccine. One dose is recommended after age 66. Talk to your health care provider  about which screenings and vaccines you need and how often you need them. This information is not intended to replace advice given to you by your health care provider. Make sure you discuss any questions you have with your health care provider. Document Released: 09/30/2015 Document Revised: 05/23/2016 Document Reviewed: 07/05/2015 Elsevier Interactive Patient Education  2017 [place] Prevention in the Home Falls can cause injuries. They can happen to people of all ages. There are many things you can do to make your home safe and to help prevent falls. What can I do on the outside of my home? Regularly fix the edges of walkways and driveways and fix any cracks. Remove anything that might make you trip as you walk through a door, such as a raised step or threshold. Trim any bushes or trees on the path to your home. Use bright outdoor lighting. Clear any walking paths of anything that might make someone trip, such as rocks or tools. Regularly check to see if handrails are loose or broken. Make sure that both sides of any steps have handrails. Any raised decks and porches should have guardrails on the edges. Have any leaves, snow, or ice cleared regularly. Use sand or salt on walking paths during winter. Clean up any spills in your garage right away. This includes oil or grease spills. What can I do in the bathroom? Use night lights. Install grab bars by the toilet and in the tub and shower. Do not use towel bars as grab bars. Use non-skid mats or decals in the tub or shower. If you need to sit down in the shower, use a plastic, non-slip stool. Keep the floor dry. Clean up any water that spills on the floor as soon as it happens. Remove soap buildup in the tub or shower regularly. Attach bath mats securely with double-sided non-slip rug tape. Do not have throw rugs and other things on the floor that can make you trip. What can I do in the bedroom? Use night lights. Make sure  that you have a light by your bed that is easy to reach. Do not use any sheets or blankets that are too big for your bed. They should not hang down onto the floor. Have a firm chair that has side arms. You can use this for support while you get dressed. Do not have throw rugs and other things on the floor that can make you trip. What can I do in the kitchen? Clean up any spills right away. Avoid walking on wet floors. Keep items that you use a lot in easy-to-reach places. If you need to reach something above you, use a strong step stool that has a grab bar. Keep electrical cords out of the way. Do not use floor polish or wax that makes floors slippery. If you must use wax, use non-skid floor wax. Do not have throw rugs and other things on the floor that can make you trip. What can I do with my stairs? Do not leave any items on the stairs. Make sure that there are handrails on both sides of the stairs and use them. Fix handrails that are broken or loose. Make sure that handrails are as long as the stairways. Check any carpeting to make sure that it is firmly attached to the stairs. Fix any carpet that is loose or worn. Avoid having throw rugs at the top or bottom  of the stairs. If you do have throw rugs, attach them to the floor with carpet tape. Make sure that you have a light switch at the top of the stairs and the bottom of the stairs. If you do not have them, ask someone to add them for you. What else can I do to help prevent falls? Wear shoes that: Do not have high heels. Have rubber bottoms. Are comfortable and fit you well. Are closed at the toe. Do not wear sandals. If you use a stepladder: Make sure that it is fully opened. Do not climb a closed stepladder. Make sure that both sides of the stepladder are locked into place. Ask someone to hold it for you, if possible. Clearly mark and make sure that you can see: Any grab bars or handrails. First and last steps. Where the edge of  each step is. Use tools that help you move around (mobility aids) if they are needed. These include: Canes. Walkers. Scooters. Crutches. Turn on the lights when you go into a dark area. Replace any light bulbs as soon as they burn out. Set up your furniture so you have a clear path. Avoid moving your furniture around. If any of your floors are uneven, fix them. If there are any pets around you, be aware of where they are. Review your medicines with your doctor. Some medicines can make you feel dizzy. This can increase your chance of falling. Ask your doctor what other things that you can do to help prevent falls. This information is not intended to replace advice given to you by your health care provider. Make sure you discuss any questions you have with your health care provider. Document Released: 06/30/2009 Document Revised: 02/09/2016 Document Reviewed: 10/08/2014 Elsevier Interactive Patient Education  2017 [employer].

## 2022-06-21 NOTE — Progress Notes (Signed)
  This service is provided via telemedicine  No vital signs collected/recorded due to the encounter was a telemedicine visit.   Location of patient [place]ex: home, work[place]  Patient consents to a telephone visit:  Yes  Location of the provider (ex: office, home):  [employer].   Name of any referring provider:  [name], [name], NP   Names of all persons participating in the telemedicine service and their role in the encounter:  Patient, Jill Preston, [name], [name], Webb Silversmith, NP.    Time spent on call: 8 minutes spent on the phone with Medical Assistant.

## 2022-06-21 NOTE — Progress Notes (Addendum)
Subjective:   Jill Preston is a 88 y.o. female who presents for Medicare Annual (Subsequent) preventive examination.  Review of Systems     Cardiac Risk Factors include: advanced age (>72mn, >>6women);hypertension;sedentary lifestyle;obesity (BMI >30kg/m2)     Objective:    Today's Vitals   06/21/22 1043  PainSc: 6    There is no height or weight on file to calculate BMI.     06/21/2022   10:22 AM 04/10/2022   10:22 AM 03/12/2022    8:26 AM 03/08/2022    1:35 PM 03/07/2022   10:53 AM 03/06/2022    4:29 PM 12/25/2021    9:26 AM  Advanced Directives  Does Patient Have a Medical Advance Directive? Yes Yes Yes Yes Yes Yes Yes  Type of [place] will;Out of facility DNR (pink MOST or yellow form) Out of facility DNR (pink MOST or yellow form)   Out of facility DNR (pink MOST or yellow form) Out of facility DNR (pink MOST or yellow form) Out of facility DNR (pink MOST or yellow form)  Does patient want to make changes to medical advance directive? No - Patient declined No - Patient declined No - Patient declined   No - Patient declined No - Patient declined  Copy of HExcelin Chart? No - copy requested        Pre-existing out of facility DNR order (yellow form or pink MOST form)     Pink MOST form placed in chart (order not valid for inpatient use) Pink MOST form placed in chart (order not valid for inpatient use)     Current Medications (verified) Outpatient Encounter Medications as of 06/21/2022  Medication Sig   amLODipine (NORVASC) 2.5 MG tablet TAKE ONE TABLET BY MOUTH EVERY MORNING   cholecalciferol (VITAMIN D3) 25 MCG (1000 UNIT) tablet Take 1,000 Units by mouth daily.    ezetimibe (ZETIA) 10 MG tablet TAKE ONE TABLET BY MOUTH EVERY MORNING   isosorbide mononitrate (IMDUR) 30 MG 24 hr tablet Take 1 tablet (30 mg total) by mouth 2 (two) times daily.   ketoconazole (NIZORAL) 2 % shampoo Apply 1 application   topically daily as needed for irritation (or itching- SHAMPOO).   KLOR-CON M20 20 MEQ tablet Take 1 tablet (20 mEq total) by mouth 2 (two) times daily.   levothyroxine (SYNTHROID) 88 MCG tablet TAKE ONE TABLET BY MOUTH BEFORE BREAKFAST   pravastatin (PRAVACHOL) 80 MG tablet Take 80 mg by mouth at bedtime.   spironolactone (ALDACTONE) 25 MG tablet Take 1 tablet (25 mg total) by mouth daily.   torsemide (DEMADEX) 100 MG tablet For a weight of 195 lbs or less, take 50 mg (half a tablet) in the morning and 50 mg in the pm. For a weight of 196 lbs and higher, take 100 mg in the morning and 50 mg in the pm   TYLENOL 8 HOUR ARTHRITIS PAIN 650 MG CR tablet Take 650 mg by mouth every 8 (eight) hours as needed for pain.   vitamin C (ASCORBIC ACID) 500 MG tablet Take 500 mg by mouth daily.   [DISCONTINUED] cetirizine (ZYRTEC) 5 MG tablet Take 1 tablet (5 mg total) by mouth daily for 14 days.   No facility-administered encounter medications on file as of 06/21/2022.    Allergies (verified) Atorvastatin, Penicillins, Shellfish allergy, Aminophylline, Iodinated contrast media, Latex, Oxybutynin chloride, Propoxyphene, Vancomycin, Adhesive [tape], Betadine [povidone iodine], Ciprofloxacin, Codeine, and Ranolazine   History: Past Medical  History:  Diagnosis Date   Arthritis    right knee   Cancer ([place]) yrs ago   skin cancer removed from face   Chronic diastolic CHF (congestive heart failure) (HCC)    Chronic venous insufficiency    LEA VENOUS, 10/17/2011 - mild reflux in bilateral common femoral veins   CKD (chronic kidney disease), stage III ([place])    Coronary artery disease    a. s/p PCI/BMS to prox LAD and balloon angioplasty to mLAD with suboptimal result in 2000. b. Abnl nuc 2012, cath 09/2011 - showed that the overall territory of potential ischemia was small and attributable to a moderately diseased small second diagonal artery. Med rx.   Dyspnea    with activity   Headache    History of blood  transfusion 2017   Hyperlipidemia    Hypertension    Hypertensive heart disease    Hypothyroidism    Obesity    Pneumonia    several times   Stroke [facility])    pt. states she had "light stroke" in sept. 1980   Urinary incontinence    Past Surgical History:  Procedure Laterality Date   ABDOMINAL HYSTERECTOMY  1970's   complete   ANKLE ARTHROSCOPY Right 07/10/2017   Procedure: ANKLE ARTHROSCOPY;  Surgeon: [name], DPM;  Location: [place];  Service: Podiatry;  Laterality: Right;   BACK SURGERY  07/26/2016   cervical neck surgery, [place] [facility]   BALLOON DILATION N/A 12/01/2021   Procedure: BALLOON DILATION;  Surgeon: Carol Ada, MD;  [facility];  Service: Gastroenterology;  Laterality: N/A;   BLADDER SURGERY  2010   WITH MESH  bladder tach   bunion removal surgery Bilateral 15 yrs ago   CARDIAC CATHETERIZATION Left 09/25/2011   Medical management   CARDIAC CATHETERIZATION Left 03/25/2001   Normal LV function, LAD residual narrowing of less than 10%, normal ramus intermediate, circumflex, and RCA,    CARDIAC CATHETERIZATION  09/04/1999   LAD, 3x63m Tetra stent resulting in a reduction of the 80% stenosis to 0% residual   CARDIAC CATHETERIZATION N/A 01/26/2015   Procedure: Right/Left Heart Cath and Coronary Angiography;  Surgeon: TTroy Sine MD;  [place];  Service: Cardiovascular;  Laterality: N/A;   CARDIAC CATHETERIZATION N/A 01/27/2015   Procedure: Intravascular Pressure Wire/FFR Study;  Surgeon: CBurnell Blanks MD;  [place];  Service: Cardiovascular;  Laterality: N/A;   CARDIAC CATHETERIZATION N/A 01/27/2015   Procedure: Right Heart Cath;  Surgeon: CBurnell Blanks MD;  [place] [facility];  Service: Cardiovascular;  Laterality: N/A;   CHOLECYSTECTOMY     COLONOSCOPY WITH PROPOFOL N/A 03/07/2017   Procedure: COLONOSCOPY WITH PROPOFOL;  Surgeon: MJuanita Craver MD;  [facility];   Service: Endoscopy;  Laterality: N/A;   CORONARY STENT PLACEMENT     LAD x 1   ESOPHAGOGASTRODUODENOSCOPY (EGD) WITH PROPOFOL N/A 12/01/2021   Procedure: ESOPHAGOGASTRODUODENOSCOPY (EGD) WITH PROPOFOL;  Surgeon: HCarol Ada MD;  [facility];  Service: Gastroenterology;  Laterality: N/A;   ganglion cyst removal  yrs ago   x 2   ILIAC VEIN ANGIOPLASTY / STENTING  02/15/2015   INTRAVASCULAR PRESSURE WIRE/FFR STUDY N/A 04/05/2017   Procedure: Intravascular Pressure Wire/FFR Study;  Surgeon: JMartinique Peter M, MD;  [place];  Service: Cardiovascular;  Laterality: N/A;   IVC FILTER INSERTION  2016   IVC FILTER [place]OVAL  02/15/2015   [facility] [place]  AND CORONARY ANGIOGRAPHY N/A 04/05/2017   Procedure: Left Heart Cath and Coronary Angiography;  Surgeon: Martinique, Peter M, MD;  Location: [place] [facility] LAB;  Service: Cardiovascular;  Laterality: N/A;   LEFT HEART CATHETERIZATION WITH CORONARY ANGIOGRAM N/A 09/25/2011   Procedure: LEFT HEART CATHETERIZATION WITH CORONARY ANGIOGRAM;  Surgeon: Sanda Klein, MD;  Location: [place] [facility];  Service: Cardiovascular;  Laterality: N/A;   multiple bladder surgeries to remove mesh     ROTATOR CUFF REPAIR Right    stent to groin Left 08/2014   left leg   TENDON REPAIR Right 07/10/2017   Procedure: RIGHT PERONEAL TENDON REPAIR;  Surgeon: [name], DPM;  Location: [place];  Service: Podiatry;  Laterality: Right;   TENDON REPAIR Right 05/27/2019   Procedure: PERONEAL TENDON REPAIR x2;  Surgeon: [name], DPM;  [facility];  Service: Podiatry;  Laterality: Right;   Family History  Problem Relation Age of Onset   Cancer Mother    Heart disease Father    Heart disease Sister    High blood pressure Sister    Heart disease Sister    Cancer Sister    Heart disease Brother    Diabetes Brother    Brain cancer Brother    High blood pressure Brother    Heart disease Brother    Heart attack Brother    Heart  disease Brother    High blood pressure Brother    Social History   Socioeconomic History   Marital status: Married    Spouse name: Not on file   Number of children: Not on file   Years of education: Not on file   Highest education level: Not on file  Occupational History   Not on file  Tobacco Use   Smoking status: Never   Smokeless tobacco: Never  Vaping Use   Vaping Use: Never used  Substance and Sexual Activity   Alcohol use: No   Drug use: No   Sexual activity: Not on file  Other Topics Concern   Not on file  Social History Narrative   Per [facility] New Patient Packet Abstracted on 03/06/2021      Diet: Left blank       Caffeine: No      Married, if yes what year: Yes, 1955      Do you live in a house, apartment, assisted living, condo, trailer, ect: House      Is it one or more stories: 1 story      How many persons live in your home? 2      Pets: No      Highest level or education completed: 12 th grade      Current/Past profession: Futures trader       Exercise:          Yes       Type and how often: Walking          Living Will: Yes   DNR: No   POA/HPOA: Yes      Functional Status:   Do you have difficulty bathing or dressing yourself? Left Blank    Do you have difficulty preparing food or eating?Left Blank   Do you have difficulty managing your medications?Left Blank   Do you have difficulty managing your finances?Left Blank   Do you have difficulty affording your medications?Left Blank   Social Determinants of Health   Financial Resource Strain: Not on file  Food Insecurity: No Food Insecurity (04/07/2020)  Hunger Vital Sign    Worried About Running Out of Food in the Last Year: Never true    Ran Out of Food in the Last Year: Never true  Transportation Needs: No Transportation Needs (04/07/2020)   PRAPARE - Hydrologist (Medical): No    Lack of Transportation (Non-Medical): No  Physical Activity: Not on file   Stress: Not on file  Social Connections: Not on file    Tobacco Counseling Counseling given: Not Answered   Clinical Intake:  Pre-visit preparation completed: No  Pain : 0-10 Pain Score: 6  Pain Type: Chronic pain Pain Location: Leg Pain Orientation: Right Pain Radiating Towards: foot to hip Pain Descriptors / Indicators: Aching, Sharp Pain Onset: More than a month ago (one year) Pain Relieving Factors: Tylenol Effect of Pain on Daily Activities: walking ,sleeping  Pain Relieving Factors: Tylenol  BMI - recorded: 32.09 Nutritional Status: BMI > 30  Obese Nutritional Risks: None Diabetes: No  How often do you need to have someone help you when you read instructions, pamphlets, or other written materials from your doctor or pharmacy?: 1 - Never What is the last grade level you completed in school?: 12 grade  Diabetic?No   Interpreter Needed?: No      Activities of Daily Living    06/21/2022   10:47 AM  In your present state of health, do you have any difficulty performing the following activities:  Hearing? 1  Vision? 0  Difficulty concentrating or making decisions? 0  Walking or climbing stairs? 1  Comment Holds rail  Dressing or bathing? 0  Doing errands, shopping? 0  Preparing Food and eating ? N  Using the Toilet? N  In the past six months, have you accidently leaked urine? Y  Do you have problems with loss of bowel control? N  Managing your Medications? N  Managing your Finances? N  Housekeeping or managing your Housekeeping? N    Patient Care Team: Abbigael Detlefsen, [name], NP as PCP - General (Family Medicine) Croitoru, Dani Gobble, MD as PCP - Cardiology (Cardiology) Croitoru, Dani Gobble, MD as Consulting Physician (Cardiology) Druscilla Brownie, MD as Referring Physician (Dermatology) [name], DPM (Inactive) as Consulting Physician (Podiatry) [name], [name], Plum (Optometry) [name], MD as Referring Physician (Gynecology)  Indicate any  recent Medical Services you may have received from other than [facility] providers in the past year (date may be approximate).     Assessment:   This is a routine wellness examination for Rylyn.  Hearing/Vision screen Hearing Screening - Comments:: No Hearing Concerns.  Vision Screening - Comments:: No vision concerns. Patient doesn't wear glasses. Patient last eye exam with Dr.Grout August 24th, 2023.  Dietary issues and exercise activities discussed: Current Exercise Habits: The patient does not participate in regular exercise at present, Exercise limited by: None identified   Goals Addressed   None    Depression Screen    06/21/2022   10:18 AM 06/15/2021   11:02 AM 04/07/2020   11:24 AM 10/19/2019   12:05 PM 07/21/2019   11:06 AM 10/06/2018   11:22 AM  PHQ 2/9 Scores  PHQ - 2 Score 0 0 0 0 0 0    Fall Risk    06/21/2022   10:18 AM 04/10/2022   10:22 AM 03/12/2022    8:46 AM 03/07/2022    9:16 AM 12/25/2021    9:12 AM  Fall Risk   Falls in the past year? 0 0 0 1 0  Number  falls in past yr: 0 0 0 1 0  Injury with Fall? 0 0 0 1 0  Risk for fall due to : No Fall Risks No Fall Risks History of fall(s) History of fall(s);Impaired balance/gait No Fall Risks  Follow up Falls evaluation completed Falls evaluation completed Falls evaluation completed Falls evaluation completed Falls evaluation completed    [place]:  Any stairs in or around the home? Yes  If so, are there any without handrails? No  Home free of loose throw rugs in walkways, pet beds, electrical cords, etc? No  Adequate lighting in your home to reduce risk of falls? Yes   ASSISTIVE DEVICES UTILIZED TO PREVENT FALLS:  Life alert? No  Use of a cane, walker or w/c? Yes  Grab bars in the bathroom? No  Shower chair or bench in shower? No  Elevated toilet seat or a handicapped toilet? No   TIMED UP AND GO:  Was the test performed? No .  Length of time to ambulate 10 feet: N/A sec.    Gait slow and steady with assistive device  Cognitive Function:        06/21/2022   10:20 AM 06/15/2021   10:59 AM  6CIT Screen  What Year? 0 points 0 points  What month? 0 points 0 points  What time? 0 points 0 points  Count back from 20 0 points 0 points  Months in reverse 0 points 0 points  Repeat phrase 2 points 4 points  Total Score 2 points 4 points    Immunizations Immunization History  Administered Date(s) Administered   PFIZER(Purple Top)SARS-COV-2 Vaccination 11/11/2019, 11/13/2019, 12/08/2019   Pneumococcal Conjugate-13 06/14/2014   Pneumococcal Polysaccharide-23 10/26/2013, 03/30/2020   Tdap 12/19/2015    TDAP status: Up to date  Flu Vaccine status: Declined, Education has been provided regarding the importance of this vaccine but patient still declined. Advised may receive this vaccine at local pharmacy or Health Dept. Aware to provide a copy of the vaccination record if obtained from local pharmacy or Health Dept. Verbalized acceptance and understanding.  Pneumococcal vaccine status: Up to date  Covid-19 vaccine status: Declined, Education has been provided regarding the importance of this vaccine but patient still declined. Advised may receive this vaccine at local pharmacy or Health Dept.or vaccine clinic. Aware to provide a copy of the vaccination record if obtained from local pharmacy or Health Dept. Verbalized acceptance and understanding.  Qualifies for Shingles Vaccine? Yes   Zostavax completed No   Shingrix Completed?: No.    Education has been provided regarding the importance of this vaccine. Patient has been advised to call insurance company to determine out of pocket expense if they have not yet received this vaccine. Advised may also receive vaccine at local pharmacy or Health Dept. Verbalized acceptance and understanding.  Screening Tests Health Maintenance  Topic Date Due   Zoster Vaccines- Shingrix (1 of 2) Never done   COVID-19 Vaccine (4 -  Pfizer risk series) 02/02/2020   INFLUENZA VACCINE  Never done   TETANUS/TDAP  12/18/2025   Pneumonia Vaccine 99+ Years old  Completed   DEXA SCAN  Completed   HPV VACCINES  Aged Out    Health Maintenance  Health Maintenance Due  Topic Date Due   Zoster Vaccines- Shingrix (1 of 2) Never done   COVID-19 Vaccine (4 - Pfizer risk series) 02/02/2020   INFLUENZA VACCINE  Never done    Colorectal cancer screening: No longer required.  Mammogram done 05/14/2022 Normal   Bone Density status: Completed 08/27/2006. Results reflect: Bone density results: NORMAL. Repeat every 5 years.  Lung Cancer Screening: (Low Dose CT Chest recommended if Age 78-80 years, 30 pack-year currently smoking OR have quit w/in 15years.) does not qualify.   Lung Cancer Screening Referral: No   Additional Screening:  Hepatitis C Screening: does not qualify; Completed No   Vision Screening: Recommended annual ophthalmology exams for early detection of glaucoma and other disorders of the eye. Is the patient up to date with their annual eye exam?  Yes  Who is the provider or what is the name of the office in which the patient attends annual eye exams? Dr.Oman  If pt is not established with a provider, would they like to be referred to a provider to establish care? No .   Dental Screening: Recommended annual dental exams for proper oral hygiene  Community Resource Referral / Chronic Care Management: CRR required this visit?  No   CCM required this visit?  No      Plan:     I have personally reviewed and noted the following in the patient's chart:   Medical and social history Use of alcohol, tobacco or illicit drugs  Current medications and supplements including opioid prescriptions. Patient is not currently taking opioid prescriptions. Functional ability and status Nutritional status Physical activity Advanced directives List of other physicians Hospitalizations, surgeries, and ER visits in  previous 12 months Vitals Screenings to include cognitive, depression, and falls Referrals and appointments  In addition, I have reviewed and discussed with patient certain preventive protocols, quality metrics, and best practice recommendations. A written personalized care plan for preventive services as well as general preventive health recommendations were provided to patient.     Sandrea Hughs, NP   06/21/2022   Nurse Notes: Declined Influenza,COVID-19  and shingles vaccines.

## 2022-06-28 ENCOUNTER — Other Ambulatory Visit: Payer: Self-pay | Admitting: Family

## 2022-06-28 DIAGNOSIS — F411 Generalized anxiety disorder: Secondary | ICD-10-CM

## 2022-07-04 ENCOUNTER — Telehealth: Payer: Self-pay | Admitting: Cardiovascular Disease

## 2022-07-04 NOTE — Telephone Encounter (Signed)
No labs needed

## 2022-07-04 NOTE — Telephone Encounter (Signed)
Patient called to check if she will need labs drawn prior to her appointment next week (10/25).  Patient stated her primary doctor stated her BP was lower than what it should be.

## 2022-07-04 NOTE — Telephone Encounter (Signed)
Returned the call to the patient. She stated that she was with her husband at his follow up and the provider checked her blood pressure. He advised her that it was low. She could not remember what it was. She has been advised to check her blood pressure 1-2 hours after taking her blood pressure and keep a log of these. If she is concerned that it is low and symptomatic, she has been advised to call us back.  She has an appointment on 10/25 with Dr. Sallyanne Kuster.

## 2022-07-05 DIAGNOSIS — Z515 Encounter for palliative care: Secondary | ICD-10-CM | POA: Diagnosis not present

## 2022-07-05 DIAGNOSIS — I5032 Chronic diastolic (congestive) heart failure: Secondary | ICD-10-CM | POA: Diagnosis not present

## 2022-07-05 DIAGNOSIS — Z6831 Body mass index (BMI) 31.0-31.9, adult: Secondary | ICD-10-CM | POA: Diagnosis not present

## 2022-07-05 DIAGNOSIS — N1831 Chronic kidney disease, stage 3a: Secondary | ICD-10-CM | POA: Diagnosis not present

## 2022-07-05 DIAGNOSIS — M79671 Pain in right foot: Secondary | ICD-10-CM | POA: Diagnosis not present

## 2022-07-05 DIAGNOSIS — D692 Other nonthrombocytopenic purpura: Secondary | ICD-10-CM | POA: Diagnosis not present

## 2022-07-11 ENCOUNTER — Encounter: Payer: Self-pay | Admitting: Cardiovascular Disease

## 2022-07-11 ENCOUNTER — Ambulatory Visit: Payer: HMO | Attending: Cardiovascular Disease | Admitting: Cardiovascular Disease

## 2022-07-11 VITALS — BP 112/60 | HR 75 | Ht 65.5 in | Wt 189.6 lb

## 2022-07-11 DIAGNOSIS — I5032 Chronic diastolic (congestive) heart failure: Secondary | ICD-10-CM | POA: Diagnosis not present

## 2022-07-11 DIAGNOSIS — E7800 Pure hypercholesterolemia, unspecified: Secondary | ICD-10-CM

## 2022-07-11 DIAGNOSIS — E669 Obesity, unspecified: Secondary | ICD-10-CM

## 2022-07-11 DIAGNOSIS — I10 Essential (primary) hypertension: Secondary | ICD-10-CM | POA: Diagnosis not present

## 2022-07-11 DIAGNOSIS — N182 Chronic kidney disease, stage 2 (mild): Secondary | ICD-10-CM

## 2022-07-11 DIAGNOSIS — I25118 Atherosclerotic heart disease of native coronary artery with other forms of angina pectoris: Secondary | ICD-10-CM

## 2022-07-11 DIAGNOSIS — R600 Localized edema: Secondary | ICD-10-CM

## 2022-07-11 DIAGNOSIS — N1831 Chronic kidney disease, stage 3a: Secondary | ICD-10-CM

## 2022-07-11 MED ORDER — TORSEMIDE 100 MG PO TABS: ORAL_TABLET | ORAL | 6 refills | Status: DC

## 2022-07-11 NOTE — Patient Instructions (Signed)
Medication Instructions:  Torsemide: For a weight of 190 lbs or less, take 50 mg (half a tablet) in the morning and 50 mg in the pm. For a weight of 191 lbs and higher, take 100 mg in the morning and 50 mg in the pm  *If you need a refill on your cardiac medications before your next appointment, please call your pharmacy*   Lab Work: Your provider would like for you to return in 4 months before you next appointment to have the following labs drawn: BMET. You do not need an appointment for the lab. Once in our office lobby there is a podium where you can sign in and ring the doorbell to alert [place] that you are here. The lab is open from 8:00 am to 4 pm; closed for lunch from 12:45pm-1:45pm.  You may also go to any of these [facility] locations:   [place] ([place]) - [place] [address] [place] [address] [place] - [address] ([facility]  If you have labs (blood work) drawn today and your tests are completely normal, you will receive your results only by: [employer] (if you have MyChart) OR A paper copy in the mail If you have any lab test that is abnormal or we need to change your treatment, we will call you to review the results.   Testing/Procedures: None ordered   Follow-Up: At [facility], you and your health needs are our priority.  As part of our continuing mission to provide you with exceptional heart care, we have created designated Provider Care Teams.  These Care Teams include your primary Cardiologist (physician) and Advanced Practice Providers (APPs -  Physician Assistants and Nurse Practitioners) who all work together to provide you with the care you need, when you need it.  We recommend  signing up for the patient portal called "MyChart".  Sign up information is provided on this After Visit Summary.  MyChart is used to connect with patients for Virtual Visits (Telemedicine).  Patients are able to view lab/test results, encounter notes, upcoming appointments, etc.  Non-urgent messages can be sent to your provider as well.   To learn more about what you can do with MyChart, go to [email or web address].    Your next appointment:   4 month(s)  The format for your next appointment:   In Person  Provider:   Sanda Klein, MD

## 2022-07-11 NOTE — Progress Notes (Signed)
Patient ID: Jill Preston, female   DOB: 1933/05/12, 88 y.o.   MRN: [id number]    Cardiology Office Note    Date:  07/11/2022   ID:  Jill Preston, DOB 07-18-1933, MRN [id number]  PCP:  Sandrea Hughs, NP  Cardiologist:   Sanda Klein, MD   No chief complaint on file.    History of Present Illness:  Jill Preston is a 88 y.o. female returning in follow-up for CAD and CHF.  Has a moderate stenosis of the left main coronary artery, not  hemodynamically significant by FFR analysis in 2016 and again in 2018.  She has chronic diastolic heart failure that responds well to diuretics, but excessive diuresis has occasionally led to acute kidney injury and severe hypokalemia.  She has been doing better after adding spironolactone.  This has helped with control of her edema and she has not had any of the symptoms of hypokalemia.  She denies palpitations, dizziness or syncope.  Wears an orthosis on her right foot.  Her weights reached a minimum of 179 pounds but now has been in the mid 180s.  She still has mild swelling in her right foot, but not on the left.  She reports occasional issues with wheezing and coughing when she lies in bed at night, but this resolves without having to get out of bed.  She just uses a cough drop and falls asleep.  She does not wake up in the middle of night with shortness of breath.  She has not had any chest pain.  Her husband Mikki Santee fell and fractured his back and required surgery about 3 weeks ago.  He has been unable to leave the house since.  Thankfully, Jill Preston has not had any falls in the last year or so.  Her PCP told her that her blood pressure was "too low" at the last appointment, but I am not sure what the actual value was.  Today in clinic her blood pressure is fine and at home she is kept a record over the last week with a range of 114/62-126/69 and a heart rate of 60-78 check.  She has a long history of coronary disease. She underwent proximal LAD  bare-metal stenting and balloon angioplasty of the mid LAD in 2013. She has had persistent ischemia in the territory of the diagonal artery on nuclear stress test performed over the last few years. Coronary angiography performed May 2016 showed a widely patent LAD stent, 60% ostial circumflex stenosis, 20-30% right coronary artery stenosis. There was concern about possible left coronary artery stenosis but fractional flow reserve was normal (0.96 at baseline, 0.91 during intravenous adenosine infusion). She had good anginal response to Ranexa but developed severe constipation and could not tolerate the medication. She has been intolerant to beta blockers due to bradycardia. She has preserved left ventricular systolic function but has had episodes of acute exacerbation of diastolic heart failure attributed to hypertensive heart disease. December 2015, she was critically ill with an acute left iliofemoral DVT with anticoagulation complicated by retroperitoneal hematoma and hemorrhagic shock, requiring placement of an inferior vena cava filter. The filter was removed in June 2016. Coronary angiography in July 2018 showed unchanged anatomy of the eccentric stenosis in the left main coronary artery with noncritical FFR (0.89 in the LAD, 0.86 in the LCx).  Intravascular Pressure Wire/FFR Study  Left Heart Cath and Coronary Angiography  Conclusion     LM lesion, 70 %stenosed. Mid LAD lesion, 0 %stenosed at site  of prior stent. Ost Cx lesion, 60 %stenosed. Prox RCA-1 lesion, 20 %stenosed. Prox RCA-2 lesion, 30 %stenosed. The left ventricular systolic function is normal. LV end diastolic pressure is normal. The left ventricular ejection fraction is 55-65% by visual estimate.   1. 70% eccentric distal left main stenosis. Angiographically similar to 2016. FFR 0.89 into the LAD and 0.86 into the LCx suggesting that this lesion is not flow limiting.  2. Otherwise nonobstructive CAD. Patent stent in LAD. 3.  Normal LV function 4. Normal LVEDP   Plan: recommend continued medical therapy.     Past Medical History:  Diagnosis Date   Arthritis    right knee   Cancer ([place]) yrs ago   skin cancer removed from face   Chronic diastolic CHF (congestive heart failure) (HCC)    Chronic venous insufficiency    LEA VENOUS, 10/17/2011 - mild reflux in bilateral common femoral veins   CKD (chronic kidney disease), stage III ([place])    Coronary artery disease    a. s/p PCI/BMS to prox LAD and balloon angioplasty to mLAD with suboptimal result in 2000. b. Abnl nuc 2012, cath 09/2011 - showed that the overall territory of potential ischemia was small and attributable to a moderately diseased small second diagonal artery. Med rx.   Dyspnea    with activity   Headache    History of blood transfusion 2017   Hyperlipidemia    Hypertension    Hypertensive heart disease    Hypothyroidism    Obesity    Pneumonia    several times   Stroke [facility])    pt. states she had "light stroke" in sept. 1980   Urinary incontinence     Past Surgical History:  Procedure Laterality Date   ABDOMINAL HYSTERECTOMY  1970's   complete   ANKLE ARTHROSCOPY Right 07/10/2017   Procedure: ANKLE ARTHROSCOPY;  Surgeon: [name], DPM;  Location: [facility];  Service: Podiatry;  Laterality: Right;   BACK SURGERY  07/26/2016   cervical neck surgery, [facility]   BALLOON DILATION N/A 12/01/2021   Procedure: BALLOON DILATION;  Surgeon: Carol Ada, MD;  [facility];  Service: Gastroenterology;  Laterality: N/A;   BLADDER SURGERY  2010   WITH MESH  bladder tach   bunion removal surgery Bilateral 15 yrs ago   CARDIAC CATHETERIZATION Left 09/25/2011   Medical management   CARDIAC CATHETERIZATION Left 03/25/2001   Normal LV function, LAD residual narrowing of less than 10%, normal ramus intermediate, circumflex, and RCA,    CARDIAC CATHETERIZATION  09/04/1999   LAD, 3x30m Tetra stent resulting in a reduction  of the 80% stenosis to 0% residual   CARDIAC CATHETERIZATION N/A 01/26/2015   Procedure: Right/Left Heart Cath and Coronary Angiography;  Surgeon: TTroy Sine MD;  [facility];  Service: Cardiovascular;  Laterality: N/A;   CARDIAC CATHETERIZATION N/A 01/27/2015   Procedure: Intravascular Pressure Wire/FFR Study;  Surgeon: CBurnell Blanks MD;  [place];  Service: Cardiovascular;  Laterality: N/A;   CARDIAC CATHETERIZATION N/A 01/27/2015   Procedure: Right Heart Cath;  Surgeon: CBurnell Blanks MD;  [facility];  Service: Cardiovascular;  Laterality: N/A;   CHOLECYSTECTOMY     COLONOSCOPY WITH PROPOFOL N/A 03/07/2017   Procedure: COLONOSCOPY WITH PROPOFOL;  Surgeon: MJuanita Craver MD;  [facility];  Service: Endoscopy;  Laterality: N/A;   CORONARY STENT PLACEMENT     LAD x 1   ESOPHAGOGASTRODUODENOSCOPY (EGD) WITH  PROPOFOL N/A 12/01/2021   Procedure: ESOPHAGOGASTRODUODENOSCOPY (EGD) WITH PROPOFOL;  Surgeon: Carol Ada, MD;  [facility];  Service: Gastroenterology;  Laterality: N/A;   ganglion cyst removal  yrs ago   x 2   ILIAC VEIN ANGIOPLASTY / STENTING  02/15/2015   INTRAVASCULAR PRESSURE WIRE/FFR STUDY N/A 04/05/2017   Procedure: Intravascular Pressure Wire/FFR Study;  Surgeon: Martinique, Peter M, MD;  Location: [facility];  Service: Cardiovascular;  Laterality: N/A;   IVC FILTER INSERTION  2016   IVC FILTER REMOVAL  02/15/2015   at [place] CATH AND CORONARY ANGIOGRAPHY N/A 04/05/2017   Procedure: Left Heart Cath and Coronary Angiography;  Surgeon: Martinique, Peter M, MD;  Location: [facility];  Service: Cardiovascular;  Laterality: N/A;   LEFT HEART CATHETERIZATION WITH CORONARY ANGIOGRAM N/A 09/25/2011   Procedure: LEFT HEART CATHETERIZATION WITH CORONARY ANGIOGRAM;  Surgeon: Sanda Klein, MD;  Location: [facility];  Service: Cardiovascular;  Laterality: N/A;   multiple bladder  surgeries to remove mesh     ROTATOR CUFF REPAIR Right    stent to groin Left 08/2014   left leg   TENDON REPAIR Right 07/10/2017   Procedure: RIGHT PERONEAL TENDON REPAIR;  Surgeon: [name], DPM;  Location: [facility];  Service: Podiatry;  Laterality: Right;   TENDON REPAIR Right 05/27/2019   Procedure: PERONEAL TENDON REPAIR x2;  Surgeon: [name], DPM;  [facility];  Service: Podiatry;  Laterality: Right;    Outpatient Medications Prior to Visit  Medication Sig Dispense Refill   amLODipine (NORVASC) 2.5 MG tablet TAKE ONE TABLET BY MOUTH EVERY MORNING 90 tablet 3   cholecalciferol (VITAMIN D3) 25 MCG (1000 UNIT) tablet Take 1,000 Units by mouth daily.      ezetimibe (ZETIA) 10 MG tablet TAKE ONE TABLET BY MOUTH EVERY MORNING 90 tablet 3   isosorbide mononitrate (IMDUR) 30 MG 24 hr tablet Take 1 tablet (30 mg total) by mouth 2 (two) times daily. 180 tablet 3   ketoconazole (NIZORAL) 2 % shampoo Apply 1 application  topically daily as needed for irritation (or itching- SHAMPOO).     KLOR-CON M20 20 MEQ tablet Take 1 tablet (20 mEq total) by mouth 2 (two) times daily. 180 tablet 1   levothyroxine (SYNTHROID) 88 MCG tablet TAKE ONE TABLET BY MOUTH BEFORE BREAKFAST 90 tablet 3   pravastatin (PRAVACHOL) 80 MG tablet Take 80 mg by mouth at bedtime.     sertraline (ZOLOFT) 25 MG tablet TAKE ONE TABLET BY MOUTH ONCE DAILY 30 tablet 3   spironolactone (ALDACTONE) 25 MG tablet Take 1 tablet (25 mg total) by mouth daily. 90 tablet 3   TYLENOL 8 HOUR ARTHRITIS PAIN 650 MG CR tablet Take 650 mg by mouth every 8 (eight) hours as needed for pain.     vitamin C (ASCORBIC ACID) 500 MG tablet Take 500 mg by mouth daily.     torsemide (DEMADEX) 100 MG tablet For a weight of 195 lbs or less, take 50 mg (half a tablet) in the morning and 50 mg in the pm. For a weight of 196 lbs and higher, take 100 mg in the morning and 50 mg in the pm 45 tablet 6   No facility-administered medications prior to  visit.     Allergies:   Atorvastatin, Penicillins, Shellfish allergy, Aminophylline, Iodinated contrast media, Latex, Oxybutynin chloride, Propoxyphene, Vancomycin, Adhesive [tape], Betadine [povidone iodine], Ciprofloxacin, Codeine, and Ranolazine   Social History  Socioeconomic History   Marital status: Married    Spouse name: Not on file   Number of children: Not on file   Years of education: Not on file   Highest education level: Not on file  Occupational History   Not on file  Tobacco Use   Smoking status: Never   Smokeless tobacco: Never  Vaping Use   Vaping Use: Never used  Substance and Sexual Activity   Alcohol use: No   Drug use: No   Sexual activity: Not on file  Other Topics Concern   Not on file  Social History Narrative   Per [facility] Patient Packet Abstracted on 03/06/2021      Diet: Left blank       Caffeine: No      Married, if yes what year: Yes, 1955      Do you live in a house, apartment, assisted living, condo, trailer, ect: House      Is it one or more stories: 1 story      How many persons live in your home? 2      Pets: No      Highest level or education completed: 12 th grade      Current/Past profession: Futures trader       Exercise:          Yes       Type and how often: Walking          Living Will: Yes   DNR: No   POA/HPOA: Yes      Functional Status:   Do you have difficulty bathing or dressing yourself? Left Blank    Do you have difficulty preparing food or eating?Left Blank   Do you have difficulty managing your medications?Left Blank   Do you have difficulty managing your finances?Left Blank   Do you have difficulty affording your medications?Left Blank   Social Determinants of Health   Financial Resource Strain: Not on file  Food Insecurity: No Food Insecurity (04/07/2020)   Hunger Vital Sign    Worried About Running Out of Food in the Last Year: Never true    Ran Out of Food in the Last Year: Never true   Transportation Needs: No Transportation Needs (04/07/2020)   PRAPARE - Hydrologist (Medical): No    Lack of Transportation (Non-Medical): No  Physical Activity: Not on file  Stress: Not on file  Social Connections: Not on file     Family History:  The patient's family history includes Brain cancer in her brother; Cancer in her mother and sister; Diabetes in her brother; Heart attack in her brother; Heart disease in her brother, brother, brother, father, sister, and sister; High blood pressure in her brother, brother, and sister.   ROS:   Please see the history of present illness.    ROS All other systems are reviewed and are negative.   PHYSICAL EXAM:   VS:  BP 112/60 (BP Location: Left Arm, Patient Position: Sitting, Cuff Size: Large)   Pulse 75   Ht 5' 5.5" (1.664 m)   Wt 86 kg   SpO2 92%   BMI 31.07 kg/m      General: Alert, oriented x3, no distress, mildly obese Head: no evidence of trauma, PERRL, EOMI, no exophtalmos or lid lag, no myxedema, no xanthelasma; normal ears, nose and oropharynx Neck: normal jugular venous pulsations and no hepatojugular reflux; brisk carotid pulses without delay and no carotid bruits Chest: clear  to auscultation, no signs of consolidation by percussion or palpation, normal fremitus, symmetrical and full respiratory excursions Cardiovascular: normal position and quality of the apical impulse, regular rhythm, normal first and second heart sounds, no murmurs, rubs or gallops Abdomen: no tenderness or distention, no masses by palpation, no abnormal pulsatility or arterial bruits, normal bowel sounds, no hepatosplenomegaly Extremities: no clubbing, cyanosis or edema; 2+ radial, ulnar and brachial pulses bilaterally; 2+ right femoral, posterior tibial and dorsalis pedis pulses; 2+ left femoral, posterior tibial and dorsalis pedis pulses; no subclavian or femoral bruits Neurological: grossly nonfocal Psych: Normal mood and  affect      Wt Readings from Last 3 Encounters:  07/11/22 86 kg  04/10/22 88.8 kg  03/29/22 84.5 kg      Studies/Labs Reviewed:   EKG:  EKG is not ordered today.  The most recent tracing from 03/29/2022 is personally reviewed, shows normal sinus rhythm and is a normal tracing. Recent Labs: 03/07/2022: TSH 1.02 03/08/2022: B Natriuretic Peptide 204.5; Magnesium 2.1 03/09/2022: ALT 27 03/12/2022: Hemoglobin 15.6; Platelets 122 05/04/2022: BUN 15; Creatinine, Ser 0.88; Potassium 4.2; Sodium 143   Lipid Panel    Component Value Date/Time   CHOL 139 03/07/2022 1014   CHOL 193 11/18/2017 0857   TRIG 82 03/07/2022 1014   HDL 71 03/07/2022 1014   HDL 47 11/18/2017 0857   CHOLHDL 2.0 03/07/2022 1014   VLDL 48 (H) 06/27/2015 1336   LDLCALC 52 03/07/2022 1014      Labs 03/23/2020 Total cholesterol 145, HDL 55, LDL 69, triglycerides 118 TSH 0.886  09/21/2020 Total cholesterol 122, HDL 56, LDL 47, triglycerides 101  09/05/2021 Cholesterol 136, HDL 69, LDL 51, triglycerides 78  03/07/2022 Cholesterol 139, HDL 71, LDL 52, triglycerides 82   CATH 04/05/2017  LM lesion, 70 %stenosed. Mid LAD lesion, 0 %stenosed at site of prior stent. Ost Cx lesion, 60 %stenosed. Prox RCA-1 lesion, 20 %stenosed. Prox RCA-2 lesion, 30 %stenosed. The left ventricular systolic function is normal. LV end diastolic pressure is normal. The left ventricular ejection fraction is 55-65% by visual estimate.   1. 70% eccentric distal left main stenosis. Angiographically similar to 2016. FFR 0.89 into the LAD and 0.86 into the LCx suggesting that this lesion is not flow limiting.  2. Otherwise nonobstructive CAD. Patent stent in LAD. 3. Normal LV function 4. Normal LVEDP   Plan: recommend continued medical therapy.    ASSESSMENT:    1. Chronic diastolic heart failure ([place])   2. Coronary artery disease of native artery of native heart with stable angina pectoris ([place])   3. Essential hypertension    4. Edema of left lower extremity   5. Hypercholesterolemia   6. Mild obesity   7. Stage 3a chronic kidney disease ([place])   8. CKD (chronic kidney disease) stage 2, GFR 60-89 ml/min       PLAN:  In order of problems listed above:  CHF: She is at the upper limit of what I would consider her "dry weight" which is about 185-190 pounds on the office scale and 5 pounds less on her home scale.  Has not had problems with heart failure exacerbation or severe hypokalemia since adding spironolactone.  Avoid salty foods, weigh daily.  Can take extra torsemide if her weight exceeds 190 pounds on her home scale.  Previous attempts at aggressive diuresis using metolazone has led to acute kidney injury on a couple of occasions.  CAD: Chest pain has not bothered her much recently.  Has chronic  atypical chest pressure that does not appear to be exertional.   She has a known moderate stenosis in the left coronary artery that was not significant by FFR performed as recently as 2018.  Nuclear stress testing would not be useful to evaluate this due to the likelihood of balanced ischemia.  If necessary, would follow this up with coronary CT angiography.  However I do not think she would be a candidate for bypass surgery anymore, with advancing age and worsening functional status.  She does not tolerate beta-blockers due to bradycardia and ranolazine caused severe constipation. HTN: Very well controlled on the current medications.  The lower dose of amlodipine has helped with reduction in edema and the spironolactone is hopefully preventing hypokalemia.  Blood pressure should always be checked under right side since she has mild left subclavian artery stenosis. PAD: Denies symptoms of subclavian steal syndrome or left arm claudication..  Known left subclavian stenosis. Hyperlipidemia: Continue current statin/ezetimibe prescription which is doing a good job with her lipid profile.  Most recent LDL was 52. Obesity: Weight  generally hovers in the mildly obese/severely overweight range. CKD 2: Most recent labs show improved creatinine level with a GFR probably in the 65-75 range Neurological complaints: She had some complaints that might have represented a TIA earlier this year, but I wonder whether they were more related to anxiety and frustration.  No recent problems to suggest TIA or stroke.  Repeat carotid duplex ultrasound performed 11/03/2021 shows widely patent carotids and antegrade flow in the vertebral arteries bilaterally, without significant plaque leaving arteries were not mentioned, but on their evaluation at that time the blood pressure was actually little higher on the left than on the right.   Medication Adjustments/Labs and Tests Ordered: Current medicines are reviewed at length with the patient today.  Concerns regarding medicines are outlined above.  Medication changes, Labs and Tests ordered today are listed in the Patient Instructions below. Patient Instructions  Medication Instructions:  Torsemide: For a weight of 190 lbs or less, take 50 mg (half a tablet) in the morning and 50 mg in the pm. For a weight of 191 lbs and higher, take 100 mg in the morning and 50 mg in the pm  *If you need a refill on your cardiac medications before your next appointment, please call your pharmacy*   Lab Work: Your provider would like for you to return in 4 months before you next appointment to have the following labs drawn: BMET. You do not need an appointment for the lab. Once in our office lobby there is a podium where you can sign in and ring the doorbell to alert [place] that you are here. The lab is open from 8:00 am to 4 pm; closed for lunch from 12:45pm-1:45pm.  You may also go to any of these [facility] locations:   [place] ([place]) - [place] [address] [place] [address] - [address] [place] - [address] ([facility]  If you have labs (blood work) drawn today and your tests are completely normal, you will receive your results only by: [employer] (if you have MyChart) OR A paper copy in the mail If  you have any lab test that is abnormal or we need to change your treatment, we will call you to review the results.   Testing/Procedures: None ordered   Follow-Up: At [facility], you and your health needs are our priority.  As part of our continuing mission to provide you with exceptional heart care, we have created designated Provider Care Teams.  These Care Teams include your primary Cardiologist (physician) and Advanced Practice Providers (APPs -  Physician Assistants and Nurse Practitioners) who all work together to provide you with the care you need, when you need it.  We recommend signing up for the patient portal called "MyChart".  Sign up information is provided on this After Visit Summary.  MyChart is used to connect with patients for Virtual Visits (Telemedicine).  Patients are able to view lab/test results, encounter notes, upcoming appointments, etc.  Non-urgent messages can be sent to your provider as well.   To learn more about what you can do with MyChart, go to [email or web address].    Your next appointment:   4 month(s)  The format for your next appointment:   In Person  Provider:   Sanda Klein, MD            Signed, Sanda Klein, MD  07/11/2022 9:28 AM    [place], [place], [place]  [address] Phone: [phone]; Fax: [phone]

## 2022-07-31 ENCOUNTER — Other Ambulatory Visit: Payer: Self-pay | Admitting: Family

## 2022-07-31 DIAGNOSIS — F411 Generalized anxiety disorder: Secondary | ICD-10-CM

## 2022-08-03 ENCOUNTER — Other Ambulatory Visit: Payer: Self-pay | Admitting: Family

## 2022-08-03 DIAGNOSIS — F411 Generalized anxiety disorder: Secondary | ICD-10-CM

## 2022-08-06 ENCOUNTER — Telehealth: Payer: Self-pay | Admitting: Cardiovascular Disease

## 2022-08-06 NOTE — Telephone Encounter (Signed)
Spoke with patient who stated she is not able to afford her medications. She is not sure what medications they are, but will call [facility] to find out and return call to our clinic.

## 2022-08-06 NOTE — Telephone Encounter (Signed)
Spoke with social worker Gae Gallop who [employer]. She stated that the 3 medications the patient listed were a total of $90.00 for 90-day supply. LMTCB on patient's phone.

## 2022-08-06 NOTE — Telephone Encounter (Signed)
LMTCB

## 2022-08-06 NOTE — Telephone Encounter (Signed)
Patient returned RN's call and provided the following 3 medications as requested:  KLOR-CON M20 20 MEQ tablet  ezetimibe (ZETIA) 10 MG tablet  levothyroxine (SYNTHROID) 88 MCG tablet

## 2022-08-06 NOTE — Telephone Encounter (Signed)
Patient is calling stating three of her prescription from our office are ready for pick up, but they will be $90 each. She is requesting a callback with assistance on how the cost could be lowered for these. She was unsure of the names, but believes one of them was the Zetia. Please advise.

## 2022-08-07 NOTE — Telephone Encounter (Signed)
Pt returning call

## 2022-08-07 NOTE — Telephone Encounter (Signed)
Patient informed of the following... "Spoke with social worker Gae Gallop who [employer]. She stated that the 3 medications the patient listed were a total of $90.00 for 90-day supply. LMTCB on patient's phone."  [facility] stated [facility] has already mailed out her medications.

## 2022-08-07 NOTE — Telephone Encounter (Signed)
LMTCB

## 2022-09-04 ENCOUNTER — Ambulatory Visit (INDEPENDENT_AMBULATORY_CARE_PROVIDER_SITE_OTHER): Payer: HMO | Admitting: Family

## 2022-09-04 ENCOUNTER — Encounter: Payer: Self-pay | Admitting: Family

## 2022-09-04 VITALS — BP 120/80 | HR 65 | Temp 96.9°F | Resp 18 | Ht 65.5 in | Wt 182.2 lb

## 2022-09-04 DIAGNOSIS — I110 Hypertensive heart disease with heart failure: Secondary | ICD-10-CM | POA: Diagnosis not present

## 2022-09-04 DIAGNOSIS — E782 Mixed hyperlipidemia: Secondary | ICD-10-CM

## 2022-09-04 DIAGNOSIS — L0591 Pilonidal cyst without abscess: Secondary | ICD-10-CM

## 2022-09-04 DIAGNOSIS — I5032 Chronic diastolic (congestive) heart failure: Secondary | ICD-10-CM | POA: Diagnosis not present

## 2022-09-04 DIAGNOSIS — E039 Hypothyroidism, unspecified: Secondary | ICD-10-CM

## 2022-09-04 NOTE — Progress Notes (Signed)
Provider: Marlowe Sax FNP-C   Desirea Mizrahi, [name], NP  Patient Care Team: Trevin Gartrell, [name], NP as PCP - General (Family Medicine) Croitoru, Dani Gobble, MD as PCP - Cardiology (Cardiology) Sanda Klein, MD as Consulting Physician (Cardiology) Druscilla Brownie, MD as Referring Physician (Dermatology) [name], DPM (Inactive) as Consulting Physician (Podiatry) [name], [name], Celina (Optometry) [name], MD as Referring Physician (Gynecology)  Extended Emergency Contact Information Primary Emergency Contact: Yuhasz,Lewis "Meribeth Mattes [place] Phone: [phone] Mobile Phone: [phone] Relation: Son Secondary Emergency Contact: [facility] Phone: [phone] Mobile Phone: [phone] Relation: Friend  Code Status: Full code  goals of care: Advanced Directive information    06/21/2022   10:22 AM  Advanced Directives  Does Patient Have a Medical Advance Directive? Yes  Type of Paramedic of [place];Living will;Out of facility DNR (pink MOST or yellow form)  Does patient want to make changes to medical advance directive? No - Patient declined  Copy of [place] in Chart? No - copy requested     Chief Complaint  Patient presents with   Medical Management of Chronic Issues    6 month follow-up    HPI:  Pt is a 88 y.o. female seen today for 6 months for  medical management of chronic diseases. She complains of sacral area cyst worsening pain.States had it operated on in the past but was not completely removed by Gynecologist long time ago when she was expectant.uses warm water to help with the pain which makes it feel so much better.Has a painkiller cream that she applies at night.Also sit on a cushion that prevent pressure Pain worst with walking. She denies any redness,drainage,fever or chills.    Past Medical History:  Diagnosis Date   Arthritis    right knee    Cancer ([place]) yrs ago   skin cancer removed from face   Chronic diastolic CHF (congestive heart failure) (HCC)    Chronic venous insufficiency    LEA VENOUS, 10/17/2011 - mild reflux in bilateral common femoral veins   CKD (chronic kidney disease), stage III ([place])    Coronary artery disease    a. s/p PCI/BMS to prox LAD and balloon angioplasty to mLAD with suboptimal result in 2000. b. Abnl nuc 2012, cath 09/2011 - showed that the overall territory of potential ischemia was small and attributable to a moderately diseased small second diagonal artery. Med rx.   Dyspnea    with activity   Headache    History of blood transfusion 2017   Hyperlipidemia    Hypertension    Hypertensive heart disease    Hypothyroidism    Obesity    Pneumonia    several times   Stroke [facility])    pt. states she had "light stroke" in sept. 1980   Urinary incontinence    Past Surgical History:  Procedure Laterality Date   ABDOMINAL HYSTERECTOMY  1970's   complete   ANKLE ARTHROSCOPY Right 07/10/2017   Procedure: ANKLE ARTHROSCOPY;  Surgeon: [name], DPM;  Location: [place];  Service: Podiatry;  Laterality: Right;   BACK SURGERY  07/26/2016   cervical neck surgery, [facility]   BALLOON DILATION N/A 12/01/2021   Procedure: BALLOON DILATION;  Surgeon: Carol Ada, MD;  [facility];  Service: Gastroenterology;  Laterality: N/A;   BLADDER SURGERY  2010   WITH MESH  bladder tach   bunion removal  surgery Bilateral 15 yrs ago   CARDIAC CATHETERIZATION Left 09/25/2011   Medical management   CARDIAC CATHETERIZATION Left 03/25/2001   Normal LV function, LAD residual narrowing of less than 10%, normal ramus intermediate, circumflex, and RCA,    CARDIAC CATHETERIZATION  09/04/1999   LAD, 3x58m Tetra stent resulting in a reduction of the 80% stenosis to 0% residual   CARDIAC CATHETERIZATION N/A 01/26/2015   Procedure: Right/Left Heart Cath and Coronary Angiography;  Surgeon: TTroy Sine MD;  [facility];  Service: Cardiovascular;  Laterality: N/A;   CARDIAC CATHETERIZATION N/A 01/27/2015   Procedure: Intravascular Pressure Wire/FFR Study;  Surgeon: CBurnell Blanks MD;  [place];  Service: Cardiovascular;  Laterality: N/A;   CARDIAC CATHETERIZATION N/A 01/27/2015   Procedure: Right Heart Cath;  Surgeon: CBurnell Blanks MD;  [place];  Service: Cardiovascular;  Laterality: N/A;   CHOLECYSTECTOMY     COLONOSCOPY WITH PROPOFOL N/A 03/07/2017   Procedure: COLONOSCOPY WITH PROPOFOL;  Surgeon: MJuanita Craver MD;  [facility];  Service: Endoscopy;  Laterality: N/A;   CORONARY STENT PLACEMENT     LAD x 1   ESOPHAGOGASTRODUODENOSCOPY (EGD) WITH PROPOFOL N/A 12/01/2021   Procedure: ESOPHAGOGASTRODUODENOSCOPY (EGD) WITH PROPOFOL;  Surgeon: HCarol Ada MD;  [facility];  Service: Gastroenterology;  Laterality: N/A;   ganglion cyst removal  yrs ago   x 2   ILIAC VEIN ANGIOPLASTY / STENTING  02/15/2015   INTRAVASCULAR PRESSURE WIRE/FFR STUDY N/A 04/05/2017   Procedure: Intravascular Pressure Wire/FFR Study;  Surgeon: JMartinique Peter M, MD;  [facility];  Service: Cardiovascular;  Laterality: N/A;   IVC FILTER INSERTION  2016   [facility]C FIL[place]OVAL  [place]/15/2015   [place] [facility] [facility] [place]/A 04/05/2017   Procedure: Left Heart Cath and Coronary Angiography;  Surgeon: JMartinique Peter M, MD;  [place] [facility];  Service: Cardiovascular;  Laterality: N/A;   LEFT HEART CATHETERIZATION WITH CORONARY ANGIOGRAM N/A 09/25/2011   Procedure: LEFT HEART CATHETERIZATION WITH CORONARY ANGIOGRAM;  Surgeon: MSanda Klein MD;  [facility];  Service: Cardiovascular;  Laterality: N/A;   multiple bladder surgeries to remove mesh     ROTATOR CUFF REPAIR Right    stent to groin Left 08/2014   left leg   TENDON REPAIR Right 07/10/2017   Procedure: RIGHT  PERONEAL TENDON REPAIR;  Surgeon: PEvelina Bucy DPM;  [place] [facility]  Service: Podiatry;  Laterality: Right;   TENDON REPAIR Right 05/27/2019   Procedure: PERONEAL TENDON REPAIR x2;  Surgeon: PEvelina Bucy DPM;  [facility];  Service: Podiatry;  Laterality: Right;    Allergies  Allergen Reactions   Atorvastatin Anaphylaxis and Other (See Comments)    Patient does not remember; caused pneumonia- took her off of it per patient   Penicillins Anaphylaxis, Hives, Swelling and Other (See Comments)    Tongue swelling Did it involve swelling of the face/tongue/throat, SOB, or low BP? Yes Did it involve sudden or severe rash/hives, skin peeling, or any reaction on the inside of your mouth or nose? Yes Did you need to seek medical attention at a hospital or doctor's office? Yes When did it last happen?  89or 88years old    If all above answers are "NO", may proceed with cephalosporin use.    Shellfish Allergy Anaphylaxis, Nausea And Vomiting and Other (See Comments)    Severe nausea  and vomiting   Aminophylline Itching, Swelling, Rash and Other (See Comments)    Pt experienced burning urination, itching, redness/rash, and swelling of genitals after given this medication via IV push.   Iodinated Contrast Media Swelling and Other (See Comments)    FLUSHING, also; LLE became swollen   Latex Other (See Comments)    Causes blisters   Oxybutynin Chloride Other (See Comments)    "blisters"   Propoxyphene Other (See Comments)    Unknown reaction   Vancomycin Hives and Other (See Comments)    06/02/2018- Hives arm, back and chest   Adhesive [Tape] Rash and Other (See Comments)    Paper tape is ok   Betadine [Povidone Iodine] Rash   Ciprofloxacin Hives   Codeine Nausea And Vomiting    .   Ranolazine Other (See Comments)    Constipation  .    Allergies as of 09/04/2022       Reactions   Atorvastatin Anaphylaxis, Other (See Comments)   Patient does not remember; caused  pneumonia- took her off of it per patient   Penicillins Anaphylaxis, Hives, Swelling, Other (See Comments)   Tongue swelling Did it involve swelling of the face/tongue/throat, SOB, or low BP? Yes Did it involve sudden or severe rash/hives, skin peeling, or any reaction on the inside of your mouth or nose? Yes Did you need to seek medical attention at a hospital or doctor's office? Yes When did it last happen?  6 or 88 years old    If all above answers are "NO", may proceed with cephalosporin use.   Shellfish Allergy Anaphylaxis, Nausea And Vomiting, Other (See Comments)   Severe nausea and vomiting   Aminophylline Itching, Swelling, Rash, Other (See Comments)   Pt experienced burning urination, itching, redness/rash, and swelling of genitals after given this medication via IV push.   Iodinated Contrast Media Swelling, Other (See Comments)   FLUSHING, also; LLE became swollen   Latex Other (See Comments)   Causes blisters   Oxybutynin Chloride Other (See Comments)   "blisters"   Propoxyphene Other (See Comments)   Unknown reaction   Vancomycin Hives, Other (See Comments)   06/02/2018- Hives arm, back and chest   Adhesive [tape] Rash, Other (See Comments)   Paper tape is ok   Betadine [povidone Iodine] Rash   Ciprofloxacin Hives   Codeine Nausea And Vomiting   .   Ranolazine Other (See Comments)   Constipation .        Medication List        Accurate as of September 04, 2022  9:06 AM. If you have any questions, ask your nurse or doctor.          amLODipine 2.5 MG tablet Commonly known as: NORVASC TAKE ONE TABLET BY MOUTH EVERY MORNING   ascorbic acid 500 MG tablet Commonly known as: VITAMIN C Take 500 mg by mouth daily.   cholecalciferol 25 MCG (1000 UNIT) tablet Commonly known as: VITAMIN D3 Take 1,000 Units by mouth daily.   ezetimibe 10 MG tablet Commonly known as: ZETIA TAKE ONE TABLET BY MOUTH EVERY MORNING   isosorbide mononitrate 30 MG 24 hr  tablet Commonly known as: IMDUR Take 1 tablet (30 mg total) by mouth 2 (two) times daily.   ketoconazole 2 % shampoo Commonly known as: NIZORAL Apply 1 application  topically daily as needed for irritation (or itching- SHAMPOO).   Klor-Con M20 20 MEQ tablet Generic drug: potassium chloride SA Take 1 tablet (20 mEq total) by  mouth 2 (two) times daily.   levothyroxine 88 MCG tablet Commonly known as: SYNTHROID TAKE ONE TABLET BY MOUTH BEFORE BREAKFAST   pravastatin 80 MG tablet Commonly known as: PRAVACHOL Take 80 mg by mouth at bedtime.   sertraline 25 MG tablet Commonly known as: ZOLOFT TAKE ONE TABLET BY MOUTH ONCE DAILY   spironolactone 25 MG tablet Commonly known as: ALDACTONE Take 1 tablet (25 mg total) by mouth daily.   torsemide 100 MG tablet Commonly known as: DEMADEX For a weight of 190 lbs or less, take 50 mg (half a tablet) in the morning and 50 mg in the pm. For a weight of 190 lbs and higher, take 100 mg in the morning and 50 mg in the pm   Tylenol 8 Hour Arthritis Pain 650 MG CR tablet Generic drug: acetaminophen Take 650 mg by mouth every 8 (eight) hours as needed for pain.        Review of Systems  Constitutional:  Negative for appetite change, chills, fatigue, fever and unexpected weight change.  HENT:  Negative for congestion, ear discharge, ear pain, facial swelling, hearing loss, nosebleeds, postnasal drip, rhinorrhea, sinus pressure, sinus pain, sneezing, sore throat, tinnitus and trouble swallowing.   Eyes:  Negative for pain, discharge, redness, itching and visual disturbance.  Respiratory:  Negative for cough, chest tightness, shortness of breath and wheezing.   Cardiovascular:  Negative for chest pain, palpitations and leg swelling.  Gastrointestinal:  Negative for abdominal distention, abdominal pain, blood in stool, constipation, diarrhea, nausea and vomiting.  Endocrine: Negative for cold intolerance, heat intolerance, polydipsia, polyphagia  and polyuria.  Genitourinary:  Negative for difficulty urinating, dysuria, flank pain, frequency and urgency.  Musculoskeletal:  Positive for arthralgias and gait problem. Negative for back pain, joint swelling, myalgias, neck pain and neck stiffness.  Skin:  Negative for color change, pallor, rash and wound.       Sacral pain   Neurological:  Negative for dizziness, syncope, speech difficulty, weakness, light-headedness, numbness and headaches.  Hematological:  Does not bruise/bleed easily.  Psychiatric/Behavioral:  Negative for agitation, behavioral problems, confusion, hallucinations and sleep disturbance. The patient is not nervous/anxious.     Immunization History  Administered Date(s) Administered   PFIZER(Purple Top)SARS-COV-2 Vaccination 11/11/2019, 11/13/2019, 12/08/2019   Pneumococcal Conjugate-13 06/14/2014   Pneumococcal Polysaccharide-23 10/26/2013, 03/30/2020   Tdap 12/19/2015   Pertinent  Health Maintenance Due  Topic Date Due   DEXA SCAN  Completed   INFLUENZA VACCINE  Discontinued      03/10/2022    8:00 AM 03/12/2022    8:46 AM 04/10/2022   10:22 AM 06/21/2022   10:18 AM 09/04/2022    8:51 AM  Fall Risk  Falls in the past year?  0 0 0 0  Was there an injury with Fall?  0 0 0 0  Fall Risk Category Calculator  0 0 0 0  Fall Risk Category  Low Low Low Low  Patient Fall Risk Level High fall risk Moderate fall risk Low fall risk Low fall risk Low fall risk  Patient at Risk for Falls Due to  History of fall(s) No Fall Risks No Fall Risks History of fall(s)  Fall risk Follow up  Falls evaluation completed Falls evaluation completed Falls evaluation completed Falls evaluation completed   Functional Status Survey:    Vitals:   09/04/22 0852  BP: 120/80  Pulse: 65  Resp: 18  Temp: (!) 96.9 F (36.1 C)  SpO2: 94%  Weight: 182 lb 4 oz (  82.7 kg)  Height: 5' 5.5" (1.664 m)   Body mass index is 29.87 kg/m. Physical Exam Vitals reviewed.  Constitutional:       General: She is not in acute distress.    Appearance: Normal appearance. She is overweight. She is not ill-appearing or diaphoretic.  HENT:     Head: Normocephalic.     Right Ear: Tympanic membrane, ear canal and external ear normal. There is no impacted cerumen.     Left Ear: Tympanic membrane, ear canal and external ear normal. There is no impacted cerumen.     Nose: Nose normal. No congestion or rhinorrhea.     Mouth/Throat:     Mouth: Mucous membranes are moist.     Pharynx: Oropharynx is clear. No oropharyngeal exudate or posterior oropharyngeal erythema.  Eyes:     General: No scleral icterus.       Right eye: No discharge.        Left eye: No discharge.     Extraocular Movements: Extraocular movements intact.     Conjunctiva/sclera: Conjunctivae normal.     Pupils: Pupils are equal, round, and reactive to light.  Neck:     Vascular: No carotid bruit.  Cardiovascular:     Rate and Rhythm: Normal rate and regular rhythm.     Pulses: Normal pulses.     Heart sounds: Normal heart sounds. No murmur heard.    No friction rub. No gallop.  Pulmonary:     Effort: Pulmonary effort is normal. No respiratory distress.     Breath sounds: Normal breath sounds. No wheezing, rhonchi or rales.  Chest:     Chest wall: No tenderness.  Abdominal:     General: Bowel sounds are normal. There is no distension.     Palpations: Abdomen is soft. There is no mass.     Tenderness: There is no abdominal tenderness. There is no right CVA tenderness, left CVA tenderness, guarding or rebound.  Musculoskeletal:        General: No swelling or tenderness. Normal range of motion.     Cervical back: Normal range of motion. No rigidity or tenderness.     Right lower leg: No edema.     Left lower leg: No edema.  Lymphadenopathy:     Cervical: No cervical adenopathy.  Skin:    General: Skin is warm and dry.     Coloration: Skin is not pale.     Findings: No bruising, erythema, lesion or rash.      Comments: Sacral area swelling tender to palpation without any erythema or drainage.   Neurological:     Mental Status: She is alert and oriented to person, place, and time.     Cranial Nerves: No cranial nerve deficit.     Sensory: No sensory deficit.     Motor: No weakness.     Coordination: Coordination normal.     Gait: Gait abnormal.  Psychiatric:        Mood and Affect: Mood normal.        Speech: Speech normal.        Behavior: Behavior normal.        Thought Content: Thought content normal.        Judgment: Judgment normal.     Labs reviewed: Recent Labs    03/08/22 1416 03/09/22 0439 03/21/22 0854 04/10/22 1112 05/04/22 1410  NA 137   < > 144 140 143  K 2.2*   < > 3.8 5.2 4.2  CL 89*   < >  102 105 100  CO2 37*   < > _0 GLUCOSE 111*   < > 94 98 94  BUN 40*   < > _1 CREATININE 1.17*   < > 0.85 0.77 0.88  CALCIUM 10.4*   < > 9.2 9.4 9.5  MG 2.1  --   --   --   --    < > = values in this interval not displayed.   Recent Labs    03/07/22 1014 03/08/22 1416 03/09/22 0439  AST 53* 51* 42*  ALT 28 32 27  ALKPHOS  --  72 57  BILITOT 1.7* 1.5* 1.1  PROT 6.5 7.2 5.7*  ALBUMIN  --  4.1 2.9*   Recent Labs    03/07/22 1014 03/08/22 1416 03/09/22 0439 03/12/22 0932  WBC 5.7 5.7 5.0 5.2  NEUTROABS 3,642 3.7  --  3,058  HGB 16.0* 16.1* 14.4 15.6*  HCT 46.1* 47.0* 42.3 45.6*  MCV 96.2 96.1 97.9 98.5  PLT 129* 113* 90* 122*   Lab Results  Component Value Date   TSH 1.02 03/07/2022   Lab Results  Component Value Date   HGBA1C 6.1 (H) 01/26/2015   Lab Results  Component Value Date   CHOL 139 03/07/2022   HDL 71 03/07/2022   LDLCALC 52 03/07/2022   TRIG 82 03/07/2022   CHOLHDL 2.0 03/07/2022    Significant Diagnostic Results in last 30 days:  No results found.  Assessment/Plan  1. Hypertensive heart disease with chronic diastolic congestive heart failure (HCC) - Blood pressure well-controlled -Continue on spironolactone, amlodipine  torsemide and Imdur - COMPLETE METABOLIC PANEL WITH GFR - CBC with Differential/Platelet  2. Chronic diastolic CHF (congestive heart failure) (HCC) No signs of fluid overload  - Keep legs elevated when seated to keep swelling down  - check weight at least three times per week and notify provider for any abrupt weight gain > 3 lbs  - Reduce salt intake in diet  -Continue on torsemide and spironolactone  3. Acquired hypothyroidism Lab Results  Component Value Date   TSH 1.02 03/07/2022  -Continue on levothyroxine 88 mcg on empty stomach - TSH  4. Mixed hyperlipidemia LDL in chart is at goal -Continue on Zetia and pravastatin -Dietary modification and exercise advised - Lipid panel  5. Cyst near coccyx Palpable swelling noted tender to palpation but without any erythema or drainage.  Request referral for excision.  Will refer to general surgeon for further evaluation. - Ambulatory referral to General Surgery  Family/ staff Communication: Reviewed plan of care with patient verbalized understanding  Labs/tests ordered:  - CBC with Differential/Platelet - CMP with eGFR([facility]) - TSH - Lipid panel  Next Appointment : Return in about 6 months (around 03/06/2023) for medical mangement of chronic issues.Sandrea Hughs, NP

## 2022-09-05 LAB — LIPID PANEL
Cholesterol: 162 mg/dL (ref <200)
HDL: 52 mg/dL (ref >=50)
LDL Cholesterol (Calc): 87 mg/dL (calc)
Non-HDL Cholesterol (Calc): 110 mg/dL (calc) (ref <130)
Total CHOL/HDL Ratio: 3.1 (calc) (ref <5.0)
Triglycerides: 131 mg/dL (ref <150)

## 2022-09-05 LAB — COMPLETE METABOLIC PANEL WITH GFR
AG Ratio: 1.5 (calc) (ref 1.0–2.5)
ALT: 15 U/L (ref 6–29)
AST: 32 U/L (ref 10–35)
Albumin: 3.7 g/dL (ref 3.6–5.1)
Alkaline phosphatase (APISO): 84 U/L (ref 37–153)
BUN: 18 mg/dL (ref 7–25)
CO2: 33 mmol/L — ABNORMAL HIGH (ref 20–32)
Calcium: 9.5 mg/dL (ref 8.6–10.4)
Chloride: 101 mmol/L (ref 98–110)
Creat: 0.82 mg/dL (ref 0.60–0.95)
Globulin: 2.5 g/dL (calc) (ref 1.9–3.7)
Glucose, Bld: 101 mg/dL — ABNORMAL HIGH (ref 65–99)
Potassium: 3.4 mmol/L — ABNORMAL LOW (ref 3.5–5.3)
Sodium: 142 mmol/L (ref 135–146)
Total Bilirubin: 1.4 mg/dL — ABNORMAL HIGH (ref 0.2–1.2)
Total Protein: 6.2 g/dL (ref 6.1–8.1)
eGFR: 69 mL/min/{1.73_m2} (ref >=60)

## 2022-09-05 LAB — TSH: TSH: 0.08 mIU/L — ABNORMAL LOW (ref 0.40–4.50)

## 2022-09-05 LAB — CBC WITH DIFFERENTIAL/PLATELET
Absolute Monocytes: 357 cells/uL (ref 200–950)
Basophils Absolute: 59 cells/uL (ref 0–200)
Basophils Relative: 1.4 %
Eosinophils Absolute: 349 cells/uL (ref 15–500)
Eosinophils Relative: 8.3 %
HCT: 43.4 % (ref 35.0–45.0)
Hemoglobin: 14.8 g/dL (ref 11.7–15.5)
Lymphs Abs: 1168 cells/uL (ref 850–3900)
MCH: 33.3 pg — ABNORMAL HIGH (ref 27.0–33.0)
MCHC: 34.1 g/dL (ref 32.0–36.0)
MCV: 97.5 fL (ref 80.0–100.0)
MPV: 11.2 fL (ref 7.5–12.5)
Monocytes Relative: 8.5 %
Neutro Abs: 2268 cells/uL (ref 1500–7800)
Neutrophils Relative %: 54 %
Platelets: 117 10*3/uL — ABNORMAL LOW (ref 140–400)
RBC: 4.45 10*6/uL (ref 3.80–5.10)
RDW: 12.1 % (ref 11.0–15.0)
Total Lymphocyte: 27.8 %
WBC: 4.2 10*3/uL (ref 3.8–10.8)

## 2022-09-06 ENCOUNTER — Ambulatory Visit: Payer: Self-pay | Admitting: Family

## 2022-09-11 ENCOUNTER — Telehealth: Payer: Self-pay | Admitting: Cardiovascular Disease

## 2022-09-11 NOTE — Telephone Encounter (Signed)
Patient called wanting to make sure Dr. Sallyanne Kuster reviews the blood work she had done by her PCP on 12/19, it's in EPIC.

## 2022-09-11 NOTE — Telephone Encounter (Signed)
The labs were largely normal except for the borderline low potassium and the markedly low TSH (a sign of too much thyroid hormone). Will defer adjustment of the levothyroxine dose to her PCP. Please confirm that she is taking the spironolactone and the KCl supplement as prescribed. If so, please take an extra 20 mEq of KCl (20 mEq three times daily instead of twice daily)  for three days.

## 2022-09-11 NOTE — Telephone Encounter (Signed)
Attempted to call patient, left message for patient to call back to office.   Will forward to MD for him to review labs.

## 2022-09-12 NOTE — Telephone Encounter (Signed)
Lab results was already addressed with patient on 09/05/2022 including low potassium.she was advised to take Extra potassium chloride and to lower her levothyroxine.

## 2022-09-18 DIAGNOSIS — H0102A Squamous blepharitis right eye, upper and lower eyelids: Secondary | ICD-10-CM | POA: Diagnosis not present

## 2022-09-18 DIAGNOSIS — H401131 Primary open-angle glaucoma, bilateral, mild stage: Secondary | ICD-10-CM | POA: Diagnosis not present

## 2022-09-18 DIAGNOSIS — Z961 Presence of intraocular lens: Secondary | ICD-10-CM | POA: Diagnosis not present

## 2022-09-18 DIAGNOSIS — H0102B Squamous blepharitis left eye, upper and lower eyelids: Secondary | ICD-10-CM | POA: Diagnosis not present

## 2022-10-01 ENCOUNTER — Ambulatory Visit: Payer: HMO | Admitting: Surgery

## 2022-10-08 DIAGNOSIS — Z515 Encounter for palliative care: Secondary | ICD-10-CM | POA: Diagnosis not present

## 2022-10-08 DIAGNOSIS — K7460 Unspecified cirrhosis of liver: Secondary | ICD-10-CM | POA: Diagnosis not present

## 2022-10-10 ENCOUNTER — Telehealth: Payer: Self-pay | Admitting: Cardiovascular Disease

## 2022-10-10 NOTE — Telephone Encounter (Signed)
Patient states she is returning a call, but she does not know who called or what it may have been in regards to.

## 2022-10-10 NOTE — Telephone Encounter (Signed)
Returned call to patient and advised her that I did not see where anyone had tried to call according to chart review and appointment review. Patient aware and verbalized understanding.

## 2022-10-24 ENCOUNTER — Encounter: Payer: Self-pay | Admitting: Cardiovascular Disease

## 2022-10-24 ENCOUNTER — Ambulatory Visit: Payer: HMO | Attending: Cardiovascular Disease | Admitting: Cardiovascular Disease

## 2022-10-24 VITALS — BP 124/60 | HR 64 | Ht 65.5 in | Wt 183.0 lb

## 2022-10-24 DIAGNOSIS — I25118 Atherosclerotic heart disease of native coronary artery with other forms of angina pectoris: Secondary | ICD-10-CM | POA: Diagnosis not present

## 2022-10-24 DIAGNOSIS — E782 Mixed hyperlipidemia: Secondary | ICD-10-CM | POA: Diagnosis not present

## 2022-10-24 DIAGNOSIS — E669 Obesity, unspecified: Secondary | ICD-10-CM | POA: Diagnosis not present

## 2022-10-24 DIAGNOSIS — I5032 Chronic diastolic (congestive) heart failure: Secondary | ICD-10-CM | POA: Diagnosis not present

## 2022-10-24 DIAGNOSIS — I10 Essential (primary) hypertension: Secondary | ICD-10-CM | POA: Diagnosis not present

## 2022-10-24 NOTE — Patient Instructions (Signed)
Medication Instructions:  Your physician recommends that you continue on your current medications as directed. Please refer to the Current Medication list given to you today.  *If you need a refill on your cardiac medications before your next appointment, please call your pharmacy*   Lab Work: Your physician recommends that you return before your next office visit to have the following labs drawn: Lipid Profile and BMET  If you have labs (blood work) drawn today and your tests are completely normal, you will receive your results only by: MyChart Message (if you have MyChart) OR A paper copy in the mail If you have any lab test that is abnormal or we need to change your treatment, we will call you to review the results.   Testing/Procedures: NONE   Follow-Up: At [facility], you and your health needs are our priority.  As part of our continuing mission to provide you with exceptional heart care, we have created designated Provider Care Teams.  These Care Teams include your primary Cardiologist (physician) and Advanced Practice Providers (APPs -  Physician Assistants and Nurse Practitioners) who all work together to provide you with the care you need, when you need it.  We recommend signing up for the patient portal called "MyChart".  Sign up information is provided on this After Visit Summary.  MyChart is used to connect with patients for Virtual Visits (Telemedicine).  Patients are able to view lab/test results, encounter notes, upcoming appointments, etc.  Non-urgent messages can be sent to your provider as well.   To learn more about what you can do with MyChart, go to [email or web address].    Your next appointment:   6 month(s)  Provider:   Sanda Klein, MD

## 2022-10-24 NOTE — Progress Notes (Signed)
Patient ID: Jill Preston, female   DOB: 1932/11/07, 89 y.o.   MRN: [id number]    Cardiology Office Note    Date:  10/24/2022   ID:  RYIN SCHILLO, DOB 11/29/1932, MRN [id number]  PCP:  Sandrea Hughs, NP  Cardiologist:   Sanda Klein, MD   Chief Complaint  Patient presents with   Congestive Heart Failure   Coronary Artery Disease     History of Present Illness:  Jill Preston is a 89 y.o. female returning in follow-up for CAD and CHF.  Has a moderate stenosis of the left main coronary artery, not  hemodynamically significant by FFR analysis in 2016 and again in 2018.  She has chronic diastolic heart failure that responds well to diuretics, but excessive diuresis has occasionally led to acute kidney injury and severe hypokalemia.  From a cardiac point of view she is doing quite well.  She has mild-moderate ankle edema, but no trouble putting her shoes on, no weeping wounds.  She denies dyspnea at rest or with activity, orthopnea, PND.  Her weight is staying steady in the low to mid 180 pound range.  She has not had any chest pain at rest or with household chores.  Denies palpitations, dizziness or syncope.  Adding spironolactone seem to make a lot of difference in her hemodynamic compensation.  Her biggest problem remains the stress surrounding caring for her husband Jill Preston and his progressive dementia.  This has been compounded by recurrent falls, spinal fracture, recent prolonged hospitalization with urinary tract infection related sepsis, DVT/PE and development of sacral ulcers with a prolonged stay in a nursing home.  He is now back home  .  She has a long history of coronary disease. She underwent proximal LAD bare-metal stenting and balloon angioplasty of the mid LAD in 2013. She has had persistent ischemia in the territory of the diagonal artery on nuclear stress test performed over the last few years. Coronary angiography performed May 2016 showed a widely patent LAD  stent, 60% ostial circumflex stenosis, 20-30% right coronary artery stenosis. There was concern about possible left coronary artery stenosis but fractional flow reserve was normal (0.96 at baseline, 0.91 during intravenous adenosine infusion). She had good anginal response to Ranexa but developed severe constipation and could not tolerate the medication. She has been intolerant to beta blockers due to bradycardia. She has preserved left ventricular systolic function but has had episodes of acute exacerbation of diastolic heart failure attributed to hypertensive heart disease. December 2015, she was critically ill with an acute left iliofemoral DVT with anticoagulation complicated by retroperitoneal hematoma and hemorrhagic shock, requiring placement of an inferior vena cava filter. The filter was removed in June 2016. Coronary angiography in July 2018 showed unchanged anatomy of the eccentric stenosis in the left main coronary artery with noncritical FFR (0.89 in the LAD, 0.86 in the LCx).  Intravascular Pressure Wire/FFR Study  Left Heart Cath and Coronary Angiography  Conclusion     LM lesion, 70 %stenosed. Mid LAD lesion, 0 %stenosed at site of prior stent. Ost Cx lesion, 60 %stenosed. Prox RCA-1 lesion, 20 %stenosed. Prox RCA-2 lesion, 30 %stenosed. The left ventricular systolic function is normal. LV end diastolic pressure is normal. The left ventricular ejection fraction is 55-65% by visual estimate.   1. 70% eccentric distal left main stenosis. Angiographically similar to 2016. FFR 0.89 into the LAD and 0.86 into the LCx suggesting that this lesion is not flow limiting.  2. Otherwise nonobstructive CAD.  Patent stent in LAD. 3. Normal LV function 4. Normal LVEDP   Plan: recommend continued medical therapy.     Past Medical History:  Diagnosis Date   Arthritis    right knee   Cancer ([place]) yrs ago   skin cancer removed from face   Chronic diastolic CHF (congestive heart failure)  (HCC)    Chronic venous insufficiency    LEA VENOUS, 10/17/2011 - mild reflux in bilateral common femoral veins   CKD (chronic kidney disease), stage III ([place])    Coronary artery disease    a. s/p PCI/BMS to prox LAD and balloon angioplasty to mLAD with suboptimal result in 2000. b. Abnl nuc 2012, cath 09/2011 - showed that the overall territory of potential ischemia was small and attributable to a moderately diseased small second diagonal artery. Med rx.   Dyspnea    with activity   Headache    History of blood transfusion 2017   Hyperlipidemia    Hypertension    Hypertensive heart disease    Hypothyroidism    Obesity    Pneumonia    several times   Stroke [facility])    pt. states she had "light stroke" in sept. 1980   Urinary incontinence     Past Surgical History:  Procedure Laterality Date   ABDOMINAL HYSTERECTOMY  1970's   complete   ANKLE ARTHROSCOPY Right 07/10/2017   Procedure: ANKLE ARTHROSCOPY;  Surgeon: [name], DPM;  Location: [facility];  Service: Podiatry;  Laterality: Right;   BACK SURGERY  07/26/2016   cervical neck surgery, [facility]   BALLOON DILATION N/A 12/01/2021   Procedure: BALLOON DILATION;  Surgeon: Carol Ada, MD;  Location: [facility];  Service: Gastroenterology;  Laterality: N/A;   BLADDER SURGERY  2010   WITH MESH  bladder tach   bunion removal surgery Bilateral 15 yrs ago   CARDIAC CATHETERIZATION Left 09/25/2011   Medical management   CARDIAC CATHETERIZATION Left 03/25/2001   Normal LV function, LAD residual narrowing of less than 10%, normal ramus intermediate, circumflex, and RCA,    CARDIAC CATHETERIZATION  09/04/1999   LAD, 3x6m Tetra stent resulting in a reduction of the 80% stenosis to 0% residual   CARDIAC CATHETERIZATION N/A 01/26/2015   Procedure: Right/Left Heart Cath and Coronary Angiography;  Surgeon: TTroy Sine MD;  [facility];  Service: Cardiovascular;  Laterality: N/A;   CARDIAC  CATHETERIZATION N/A 01/27/2015   Procedure: Intravascular Pressure Wire/FFR Study;  Surgeon: CBurnell Blanks MD;  [facility];  Service: Cardiovascular;  Laterality: N/A;   CARDIAC CATHETERIZATION N/A 01/27/2015   Procedure: Right Heart Cath;  Surgeon: CBurnell Blanks MD;  [facility];  Service: Cardiovascular;  Laterality: N/A;   CHOLECYSTECTOMY     COLONOSCOPY WITH PROPOFOL N/A 03/07/2017   Procedure: COLONOSCOPY WITH PROPOFOL;  Surgeon: MJuanita Craver MD;  [facility];  Service: Endoscopy;  Laterality: N/A;   CORONARY STENT PLACEMENT     LAD x 1   ESOPHAGOGASTRODUODENOSCOPY (EGD) WITH PROPOFOL N/A 12/01/2021   Procedure: ESOPHAGOGASTRODUODENOSCOPY (EGD) WITH PROPOFOL;  Surgeon: HCarol Ada MD;  [facility];  Service: Gastroenterology;  Laterality: N/A;   ganglion cyst removal  yrs ago   x 2   ILIAC VEIN ANGIOPLASTY / STENTING  02/15/2015   INTRAVASCULAR PRESSURE WIRE/FFR STUDY N/A 04/05/2017   Procedure: Intravascular Pressure Wire/FFR Study;  Surgeon: JMartinique Peter M, MD;  [facility];  Service: Cardiovascular;  Laterality: N/A;   IVC FILTER INSERTION  2016   IVC FILTER REMOVAL  02/15/2015   at [facility] CATH AND CORONARY ANGIOGRAPHY N/A 04/05/2017   Procedure: Left Heart Cath and Coronary Angiography;  Surgeon: Martinique, Peter M, MD;  Location: [facility];  Service: Cardiovascular;  Laterality: N/A;   LEFT HEART CATHETERIZATION WITH CORONARY ANGIOGRAM N/A 09/25/2011   Procedure: LEFT HEART CATHETERIZATION WITH CORONARY ANGIOGRAM;  Surgeon: Sanda Klein, MD;  Location: [facility];  Service: Cardiovascular;  Laterality: N/A;   multiple bladder surgeries to remove mesh     ROTATOR CUFF REPAIR Right    stent to groin Left 08/2014   left leg   TENDON REPAIR Right 07/10/2017   Procedure: RIGHT PERONEAL TENDON REPAIR;  Surgeon: [name], DPM;  Location: [facility];  Service: Podiatry;   Laterality: Right;   TENDON REPAIR Right 05/27/2019   Procedure: PERONEAL TENDON REPAIR x2;  Surgeon: [name], DPM;  [facility];  Service: Podiatry;  Laterality: Right;    Outpatient Medications Prior to Visit  Medication Sig Dispense Refill   amLODipine (NORVASC) 2.5 MG tablet TAKE ONE TABLET BY MOUTH EVERY MORNING 90 tablet 3   cholecalciferol (VITAMIN D3) 25 MCG (1000 UNIT) tablet Take 1,000 Units by mouth daily.      ezetimibe (ZETIA) 10 MG tablet TAKE ONE TABLET BY MOUTH EVERY MORNING 90 tablet 3   isosorbide mononitrate (IMDUR) 30 MG 24 hr tablet Take 1 tablet (30 mg total) by mouth 2 (two) times daily. 180 tablet 3   ketoconazole (NIZORAL) 2 % shampoo Apply 1 application  topically daily as needed for irritation (or itching- SHAMPOO).     KLOR-CON M20 20 MEQ tablet Take 1 tablet (20 mEq total) by mouth 2 (two) times daily. 180 tablet 1   latanoprost (XALATAN) 0.005 % ophthalmic solution Place 1 drop into both eyes at bedtime.     levothyroxine (SYNTHROID) 88 MCG tablet TAKE ONE TABLET BY MOUTH BEFORE BREAKFAST 90 tablet 3   pravastatin (PRAVACHOL) 80 MG tablet Take 80 mg by mouth at bedtime.     sertraline (ZOLOFT) 25 MG tablet TAKE ONE TABLET BY MOUTH ONCE DAILY 30 tablet 3   spironolactone (ALDACTONE) 25 MG tablet Take 1 tablet (25 mg total) by mouth daily. 90 tablet 3   torsemide (DEMADEX) 100 MG tablet For a weight of 190 lbs or less, take 50 mg (half a tablet) in the morning and 50 mg in the pm. For a weight of 190 lbs and higher, take 100 mg in the morning and 50 mg in the pm 45 tablet 6   TYLENOL 8 HOUR ARTHRITIS PAIN 650 MG CR tablet Take 650 mg by mouth every 8 (eight) hours as needed for pain.     vitamin C (ASCORBIC ACID) 500 MG tablet Take 500 mg by mouth daily.     No facility-administered medications prior to visit.     Allergies:   Atorvastatin, Penicillins, Shellfish allergy, Aminophylline, Iodinated contrast media, Latex, Oxybutynin chloride,  Propoxyphene, Vancomycin, Adhesive [tape], Betadine [povidone iodine], Ciprofloxacin, Codeine, and Ranolazine   Social History   Socioeconomic History   Marital status: Married    Spouse name: Not on file   Number of children: Not on file   Years of education: Not on file   Highest education level: Not on file  Occupational History   Not on file  Tobacco Use   Smoking status: Never   Smokeless tobacco:  Never  Vaping Use   Vaping Use: Never used  Substance and Sexual Activity   Alcohol use: No   Drug use: No   Sexual activity: Not on file  Other Topics Concern   Not on file  Social History Narrative   Per [facility] Patient Packet Abstracted on 03/06/2021      Diet: Left blank       Caffeine: No      Married, if yes what year: Yes, 1955      Do you live in a house, apartment, assisted living, condo, trailer, ect: House      Is it one or more stories: 1 story      How many persons live in your home? 2      Pets: No      Highest level or education completed: 12 th grade      Current/Past profession: Futures trader       Exercise:          Yes       Type and how often: Walking          Living Will: Yes   DNR: No   POA/HPOA: Yes      Functional Status:   Do you have difficulty bathing or dressing yourself? Left Blank    Do you have difficulty preparing food or eating?Left Blank   Do you have difficulty managing your medications?Left Blank   Do you have difficulty managing your finances?Left Blank   Do you have difficulty affording your medications?Left Blank   Social Determinants of Health   Financial Resource Strain: Not on file  Food Insecurity: No Food Insecurity (04/07/2020)   Hunger Vital Sign    Worried About Running Out of Food in the Last Year: Never true    Ran Out of Food in the Last Year: Never true  Transportation Needs: No Transportation Needs (04/07/2020)   PRAPARE - Hydrologist (Medical): No    Lack of  Transportation (Non-Medical): No  Physical Activity: Not on file  Stress: Not on file  Social Connections: Not on file     Family History:  The patient's family history includes Brain cancer in her brother; Cancer in her mother and sister; Diabetes in her brother; Heart attack in her brother; Heart disease in her brother, brother, brother, father, sister, and sister; High blood pressure in her brother, brother, and sister.   ROS:   Please see the history of present illness.    ROS All other systems are reviewed and are negative.   PHYSICAL EXAM:   VS:  BP 124/60   Pulse 64   Ht 5' 5.5" (1.664 m)   Wt 183 lb (83 kg)   SpO2 94%   BMI 29.99 kg/m       General: Alert, oriented x3, no distress, borderline obese Head: no evidence of trauma, PERRL, EOMI, no exophtalmos or lid lag, no myxedema, no xanthelasma; normal ears, nose and oropharynx Neck: normal jugular venous pulsations and no hepatojugular reflux; brisk carotid pulses without delay and no carotid bruits Chest: clear to auscultation, no signs of consolidation by percussion or palpation, normal fremitus, symmetrical and full respiratory excursions Cardiovascular: normal position and quality of the apical impulse, regular rhythm, normal first and second heart sounds, no murmurs, rubs or gallops Abdomen: no tenderness or distention, no masses by palpation, no abnormal pulsatility or arterial bruits, normal bowel sounds, no hepatosplenomegaly Extremities: no clubbing, cyanosis or edema; 2+  radial, ulnar and brachial pulses bilaterally; 2+ right femoral, posterior tibial and dorsalis pedis pulses; 2+ left femoral, posterior tibial and dorsalis pedis pulses; no subclavian or femoral bruits Neurological: grossly nonfocal Psych: Normal mood and affect   Wt Readings from Last 3 Encounters:  10/24/22 183 lb (83 kg)  09/04/22 182 lb 4 oz (82.7 kg)  07/11/22 189 lb 9.6 oz (86 kg)      Studies/Labs Reviewed:   EKG:  EKG is ordered  today and personally reviewed.  It shows normal sinus rhythm is a completely normal tracing other than borderline QTc 464 ms. Recent Labs: 03/08/2022: B Natriuretic Peptide 204.5; Magnesium 2.1 09/04/2022: ALT 15; BUN 18; Creat 0.82; Hemoglobin 14.8; Platelets 117; Potassium 3.4; Sodium 142; TSH 0.08   Lipid Panel    Component Value Date/Time   CHOL 162 09/04/2022 0938   CHOL 193 11/18/2017 0857   TRIG 131 09/04/2022 0938   HDL 52 09/04/2022 0938   HDL 47 11/18/2017 0857   CHOLHDL 3.1 09/04/2022 0938   VLDL 48 (H) 06/27/2015 1336   LDLCALC 87 09/04/2022 0938      Labs 03/23/2020 Total cholesterol 145, HDL 55, LDL 69, triglycerides 118 TSH 0.886  09/21/2020 Total cholesterol 122, HDL 56, LDL 47, triglycerides 101  09/05/2021 Cholesterol 136, HDL 69, LDL 51, triglycerides 78  03/07/2022 Cholesterol 139, HDL 71, LDL 52, triglycerides 82  09/04/2022 Cholesterol 162, HDL 52, LDL 87, triglycerides 131    CATH 04/05/2017  LM lesion, 70 %stenosed. Mid LAD lesion, 0 %stenosed at site of prior stent. Ost Cx lesion, 60 %stenosed. Prox RCA-1 lesion, 20 %stenosed. Prox RCA-2 lesion, 30 %stenosed. The left ventricular systolic function is normal. LV end diastolic pressure is normal. The left ventricular ejection fraction is 55-65% by visual estimate.   1. 70% eccentric distal left main stenosis. Angiographically similar to 2016. FFR 0.89 into the LAD and 0.86 into the LCx suggesting that this lesion is not flow limiting.  2. Otherwise nonobstructive CAD. Patent stent in LAD. 3. Normal LV function 4. Normal LVEDP   Plan: recommend continued medical therapy.    ASSESSMENT:    1. Chronic diastolic heart failure ([place])   2. Coronary artery disease of native artery of native heart with stable angina pectoris ([place])   3. Essential hypertension   4. Mixed hyperlipidemia   5. Obesity, mild       PLAN:  In order of problems listed above:  CHF: Appears to be clinically  euvolemic NYHA functional class II.  Seems to have a "dry weight" in the 180-185 pounds range.  She typically weighs about 5 pounds less on her home scale.  Doing a good job avoiding sodium excess.  Can take extra torsemide if her weight exceeds 190 pounds on her home scale.  Previous attempts at aggressive diuresis using metolazone has led to acute kidney injury on a couple of occasions.  We have had to avoid SGLT2 inhibitors due to cost. CAD: It has been quite a while since she's complained of angina.   She has a known moderate stenosis in the left coronary artery that was not significant by FFR performed as recently as 2018.  Nuclear stress testing would not be useful to evaluate this due to the likelihood of balanced ischemia.  If necessary, would follow this up with coronary CT angiography.  However I do not think she would be a candidate for bypass surgery anymore, with advancing age and worsening functional status.  She does not tolerate beta-blockers  due to bradycardia and ranolazine caused severe constipation. HTN: Very well-controlled.  Does have some issues with edema from the amlodipine but this has been helpful as an antianginal agent and the edema is less troublesome since we started spironolactone. PAD: Denies symptoms of subclavian steal syndrome or left arm claudication..  Known left subclavian stenosis.  Check blood pressure in the right arm. Hyperlipidemia: Not sure why her LDL is worse than usual.  For a long time the current combination of statin and ezetimibe has kept her LDL cholesterol in the 50s.  Now her LDL is up to 87.  It is possible that she was less compliant with her lipid lowering medications during her husband's recent acute illness.  Recheck in a few months.  Option to switch to rosuvastatin from pravastatin.  She did not tolerate atorvastatin.   Obesity: Weight generally hovers in the mildly obese/severely overweight range. CKD 2: Creatinine has been excellent recently,  around 0.8-0.9, corresponding to an excellent GFR of 65-70. Neurological complaints: She had some complaints that might have represented a TIA earlier in 2023, but I wonder whether they were more related to anxiety and frustration.  No recent problems to suggest TIA or stroke.  Repeat carotid duplex ultrasound performed 11/03/2021 shows widely patent carotids and antegrade flow in the vertebral arteries bilaterally, without significant plaque leaving arteries were not mentioned, but on their evaluation at that time the blood pressure was actually little higher on the left than on the right. Family/social stressors: Bob's dementia has had serious impact on his own physical health and on her emotional health.  She is doing the best she can.  She does have some home nursing visits that are helping she does not want to go to a nursing home.   Medication Adjustments/Labs and Tests Ordered: Current medicines are reviewed at length with the patient today.  Concerns regarding medicines are outlined above.  Medication changes, Labs and Tests ordered today are listed in the Patient Instructions below. Patient Instructions  Medication Instructions:  Your physician recommends that you continue on your current medications as directed. Please refer to the Current Medication list given to you today.  *If you need a refill on your cardiac medications before your next appointment, please call your pharmacy*   Lab Work: Your physician recommends that you return before your next office visit to have the following labs drawn: Lipid Profile and BMET  If you have labs (blood work) drawn today and your tests are completely normal, you will receive your results only by: MyChart Message (if you have MyChart) OR A paper copy in the mail If you have any lab test that is abnormal or we need to change your treatment, we will call you to review the results.   Testing/Procedures: NONE   Follow-Up: At [facility], you and your health needs are our priority.  As part of our continuing mission to provide you with exceptional heart care, we have created designated Provider Care Teams.  These Care Teams include your primary Cardiologist (physician) and Advanced Practice Providers (APPs -  Physician Assistants and Nurse Practitioners) who all work together to provide you with the care you need, when you need it.  We recommend signing up for the patient portal called "MyChart".  Sign up information is provided on this After Visit Summary.  MyChart is used to connect with patients for Virtual Visits (Telemedicine).  Patients are able to view lab/test results, encounter notes, upcoming appointments, etc.  Non-urgent messages  can be sent to your provider as well.   To learn more about what you can do with MyChart, go to [email or web address].    Your next appointment:   6 month(s)  Provider:   Sanda Klein, MD          Signed, Sanda Klein, MD  10/24/2022 2:41 PM    [facility] [place], [place], [place]  [id number] Phone: [phone]; Fax: [phone]

## 2022-10-25 ENCOUNTER — Ambulatory Visit (INDEPENDENT_AMBULATORY_CARE_PROVIDER_SITE_OTHER): Payer: HMO | Admitting: Adult Health

## 2022-10-25 ENCOUNTER — Telehealth: Payer: Self-pay | Admitting: Cardiovascular Disease

## 2022-10-25 ENCOUNTER — Encounter: Payer: Self-pay | Admitting: Adult Health

## 2022-10-25 VITALS — BP 128/62 | HR 71 | Temp 97.6°F | Resp 18 | Ht 65.5 in | Wt 185.0 lb

## 2022-10-25 DIAGNOSIS — W19XXXA Unspecified fall, initial encounter: Secondary | ICD-10-CM

## 2022-10-25 DIAGNOSIS — S60219A Contusion of unspecified wrist, initial encounter: Secondary | ICD-10-CM | POA: Diagnosis not present

## 2022-10-25 NOTE — Progress Notes (Signed)
Location:  [place] of Service:   [place]    CODE STATUS: full   Allergies  Allergen Reactions   Atorvastatin Anaphylaxis and Other (See Comments)    Patient does not remember; caused pneumonia- took her off of it per patient   Penicillins Anaphylaxis, Hives, Swelling and Other (See Comments)    Tongue swelling Did it involve swelling of the face/tongue/throat, SOB, or low BP? Yes Did it involve sudden or severe rash/hives, skin peeling, or any reaction on the inside of your mouth or nose? Yes Did you need to seek medical attention at a hospital or doctor's office? Yes When did it last happen?  32 or 89 years old    If all above answers are "NO", may proceed with cephalosporin use.    Shellfish Allergy Anaphylaxis, Nausea And Vomiting and Other (See Comments)    Severe nausea and vomiting   Aminophylline Itching, Swelling, Rash and Other (See Comments)    Pt experienced burning urination, itching, redness/rash, and swelling of genitals after given this medication via IV push.   Iodinated Contrast Media Swelling and Other (See Comments)    FLUSHING, also; LLE became swollen   Latex Other (See Comments)    Causes blisters   Oxybutynin Chloride Other (See Comments)    "blisters"   Propoxyphene Other (See Comments)    Unknown reaction   Vancomycin Hives and Other (See Comments)    06/02/2018- Hives arm, back and chest   Adhesive [Tape] Rash and Other (See Comments)    Paper tape is ok   Betadine [Povidone Iodine] Rash   Ciprofloxacin Hives   Codeine Nausea And Vomiting    .   Ranolazine Other (See Comments)    Constipation  .    Chief Complaint  Patient presents with   Acute Visit    Patient fell on yesterday and hit head and hurt left hand.    HPI:  She had a fall yesterday leaving a doctor's office. She hit the left side her of head and left wrist. She has full range of motion in her wrist without pain. She wants to make sure that her eyes are  "ok". She denies any pain; does have some stiffness present. This is the second fall she has had over the past 2 years.   Past Medical History:  Diagnosis Date   Arthritis    right knee   Cancer ([place]) yrs ago   skin cancer removed from face   Chronic diastolic CHF (congestive heart failure) (HCC)    Chronic venous insufficiency    LEA VENOUS, 10/17/2011 - mild reflux in bilateral common femoral veins   CKD (chronic kidney disease), stage III ([place])    Coronary artery disease    a. s/p PCI/BMS to prox LAD and balloon angioplasty to mLAD with suboptimal result in 2000. b. Abnl nuc 2012, cath 09/2011 - showed that the overall territory of potential ischemia was small and attributable to a moderately diseased small second diagonal artery. Med rx.   Dyspnea    with activity   Headache    History of blood transfusion 2017   Hyperlipidemia    Hypertension    Hypertensive heart disease    Hypothyroidism    Obesity    Pneumonia    several times   Stroke [facility])    pt. states she had "light stroke" in sept. 1980   Urinary incontinence     Past Surgical History:  Procedure Laterality Date  ABDOMINAL HYSTERECTOMY  1970's   complete   ANKLE ARTHROSCOPY Right 07/10/2017   Procedure: ANKLE ARTHROSCOPY;  Surgeon: [name], DPM;  Location: [place];  Service: Podiatry;  Laterality: Right;   BACK SURGERY  07/26/2016   cervical neck surgery, [facility]   BALLOON DILATION N/A 12/01/2021   Procedure: BALLOON DILATION;  Surgeon: Carol Ada, MD;  Location: [facility];  Service: Gastroenterology;  Laterality: N/A;   BLADDER SURGERY  2010   WITH MESH  bladder tach   bunion removal surgery Bilateral 15 yrs ago   CARDIAC CATHETERIZATION Left 09/25/2011   Medical management   CARDIAC CATHETERIZATION Left 03/25/2001   Normal LV function, LAD residual narrowing of less than 10%, normal ramus intermediate, circumflex, and RCA,    CARDIAC CATHETERIZATION  09/04/1999   LAD, 3x10m  Tetra stent resulting in a reduction of the 80% stenosis to 0% residual   CARDIAC CATHETERIZATION N/A 01/26/2015   Procedure: Right/Left Heart Cath and Coronary Angiography;  Surgeon: TTroy Sine MD;  [place];  Service: Cardiovascular;  Laterality: N/A;   CARDIAC CATHETERIZATION N/A 01/27/2015   Procedure: Intravascular Pressure Wire/FFR Study;  Surgeon: CBurnell Blanks MD;  [place] [facility];  Service: Cardiovascular;  Laterality: N/A;   CARDIAC CATHETERIZATION N/A 01/27/2015   Procedure: Right Heart Cath;  Surgeon: CBurnell Blanks MD;  [facility];  Service: Cardiovascular;  Laterality: N/A;   CHOLECYSTECTOMY     COLONOSCOPY WITH PROPOFOL N/A 03/07/2017   Procedure: COLONOSCOPY WITH PROPOFOL;  Surgeon: MJuanita Craver MD;  [facility];  Service: Endoscopy;  Laterality: N/A;   CORONARY STENT PLACEMENT     LAD x 1   ESOPHAGOGASTRODUODENOSCOPY (EGD) WITH PROPOFOL N/A 12/01/2021   Procedure: ESOPHAGOGASTRODUODENOSCOPY (EGD) WITH PROPOFOL;  Surgeon: HCarol Ada MD;  [facility];  Service: Gastroenterology;  Laterality: N/A;   ganglion cyst removal  yrs ago   x 2   ILIAC VEIN ANGIOPLASTY / STENTING  02/15/2015   INTRAVASCULAR PRESSURE WIRE/FFR STUDY N/A 04/05/2017   Procedure: Intravascular Pressure Wire/FFR Study;  Surgeon: JMartinique Peter M, MD;  [place] [facility];  Service: Cardiovascular;  Laterality: N/A;   IVC FILTER INSERTION  2016   [facility]C [place]OVAL  02/15/2015   [place] [place] [place]/A 04/05/2017   Procedure: Left Heart Cath and Coronary Angiography;  Surgeon: JMartinique Peter M, MD;  [place] [facility];  Service: Cardiovascular;  Laterality: N/A;   LEFT HEART CATHETERIZATION WITH CORONARY ANGIOGRAM N/A 09/25/2011   Procedure: LEFT HEART CATHETERIZATION WITH CORONARY ANGIOGRAM;  Surgeon: MSanda Klein MD;  [facility];  Service: Cardiovascular;   Laterality: N/A;   multiple bladder surgeries to remove mesh     ROTATOR CUFF REPAIR Right    stent to groin Left 08/2014   left leg   TENDON REPAIR Right 07/10/2017   Procedure: RIGHT PERONEAL TENDON REPAIR;  Surgeon: PEvelina Bucy DPM;  [place]  Service: Podiatry;  Laterality: Right;   TENDON REPAIR Right 05/27/2019   Procedure: PERONEAL TENDON REPAIR x2;  Surgeon: PEvelina Bucy DPM;  [facility];  Service: Podiatry;  Laterality: Right;    Social History   Socioeconomic History   Marital status: Married    Spouse name: Not on file   Number of children: Not on file   Years of education: Not on file   Highest education level: Not on  file  Occupational History   Not on file  Tobacco Use   Smoking status: Never   Smokeless tobacco: Never  Vaping Use   Vaping Use: Never used  Substance and Sexual Activity   Alcohol use: No   Drug use: No   Sexual activity: Not on file  Other Topics Concern   Not on file  Social History Narrative   Per [facility] Patient Packet Abstracted on 03/06/2021      Diet: Left blank       Caffeine: No      Married, if yes what year: Yes, 1955      Do you live in a house, apartment, assisted living, condo, trailer, ect: House      Is it one or more stories: 1 story      How many persons live in your home? 2      Pets: No      Highest level or education completed: 12 th grade      Current/Past profession: Futures trader       Exercise:          Yes       Type and how often: Walking          Living Will: Yes   DNR: No   POA/HPOA: Yes      Functional Status:   Do you have difficulty bathing or dressing yourself? Left Blank    Do you have difficulty preparing food or eating?Left Blank   Do you have difficulty managing your medications?Left Blank   Do you have difficulty managing your finances?Left Blank   Do you have difficulty affording your medications?Left Blank   Social Determinants of Health   Financial  Resource Strain: Not on file  Food Insecurity: No Food Insecurity (04/07/2020)   Hunger Vital Sign    Worried About Running Out of Food in the Last Year: Never true    Ran Out of Food in the Last Year: Never true  Transportation Needs: No Transportation Needs (04/07/2020)   PRAPARE - Hydrologist (Medical): No    Lack of Transportation (Non-Medical): No  Physical Activity: Not on file  Stress: Not on file  Social Connections: Not on file  Intimate Partner Violence: Not on file   Family History  Problem Relation Age of Onset   Cancer Mother    Heart disease Father    Heart disease Sister    High blood pressure Sister    Heart disease Sister    Cancer Sister    Heart disease Brother    Diabetes Brother    Brain cancer Brother    High blood pressure Brother    Heart disease Brother    Heart attack Brother    Heart disease Brother    High blood pressure Brother       VITAL SIGNS BP 128/62   Pulse 71   Temp 97.6 F (36.4 C)   Resp 18   Ht 5' 5.5" (1.664 m)   Wt 185 lb (83.9 kg)   SpO2 95%   BMI 30.32 kg/m   Outpatient Encounter Medications as of 10/25/2022  Medication Sig   amLODipine (NORVASC) 2.5 MG tablet TAKE ONE TABLET BY MOUTH EVERY MORNING   cholecalciferol (VITAMIN D3) 25 MCG (1000 UNIT) tablet Take 1,000 Units by mouth daily.    ezetimibe (ZETIA) 10 MG tablet TAKE ONE TABLET BY MOUTH EVERY MORNING   isosorbide mononitrate (IMDUR) 30 MG 24 hr  tablet Take 1 tablet (30 mg total) by mouth 2 (two) times daily.   ketoconazole (NIZORAL) 2 % shampoo Apply 1 application  topically daily as needed for irritation (or itching- SHAMPOO).   KLOR-CON M20 20 MEQ tablet Take 1 tablet (20 mEq total) by mouth 2 (two) times daily.   latanoprost (XALATAN) 0.005 % ophthalmic solution Place 1 drop into both eyes at bedtime.   levothyroxine (SYNTHROID) 88 MCG tablet TAKE ONE TABLET BY MOUTH BEFORE BREAKFAST   pravastatin (PRAVACHOL) 80 MG tablet Take 80 mg  by mouth at bedtime.   sertraline (ZOLOFT) 25 MG tablet TAKE ONE TABLET BY MOUTH ONCE DAILY   spironolactone (ALDACTONE) 25 MG tablet Take 1 tablet (25 mg total) by mouth daily.   torsemide (DEMADEX) 100 MG tablet For a weight of 190 lbs or less, take 50 mg (half a tablet) in the morning and 50 mg in the pm. For a weight of 190 lbs and higher, take 100 mg in the morning and 50 mg in the pm   TYLENOL 8 HOUR ARTHRITIS PAIN 650 MG CR tablet Take 650 mg by mouth every 8 (eight) hours as needed for pain.   vitamin C (ASCORBIC ACID) 500 MG tablet Take 500 mg by mouth daily.   No facility-administered encounter medications on file as of 10/25/2022.     SIGNIFICANT DIAGNOSTIC EXAMS  Review of Systems  Constitutional:  Negative for malaise/fatigue.  Respiratory:  Negative for cough and shortness of breath.   Cardiovascular:  Negative for chest pain, palpitations and leg swelling.  Gastrointestinal:  Negative for abdominal pain, constipation and heartburn.  Musculoskeletal:  Negative for back pain, joint pain and myalgias.  Skin:        Bruising   Neurological:  Negative for dizziness.  Psychiatric/Behavioral:  The patient is not nervous/anxious.      Physical Exam Constitutional:      General: She is not in acute distress.    Appearance: She is well-developed. She is not diaphoretic.  Neck:     Thyroid: No thyromegaly.  Cardiovascular:     Rate and Rhythm: Normal rate and regular rhythm.     Heart sounds: Normal heart sounds.  Pulmonary:     Effort: Pulmonary effort is normal. No respiratory distress.     Breath sounds: Normal breath sounds.  Abdominal:     General: Bowel sounds are normal. There is no distension.     Palpations: Abdomen is soft.     Tenderness: There is no abdominal tenderness.  Musculoskeletal:        General: Normal range of motion.     Cervical back: Neck supple.     Right lower leg: No edema.     Left lower leg: No edema.     Comments: Has full range of  motion  Lymphadenopathy:     Cervical: No cervical adenopathy.  Skin:    General: Skin is warm and dry.     Comments: Bruising left eye  Bruising left inner wrist   Neurological:     Mental Status: She is alert and oriented to person, place, and time.  Psychiatric:        Mood and Affect: Mood normal.      ASSESSMENT/ PLAN:  TODAY  Fall initial visit: she does have bruising present which should self resolve. She has been given instructions regarding her bruising.    Ok Edwards NP [facility]   call [phone]

## 2022-10-25 NOTE — Patient Instructions (Signed)
You have had a fall. You have bruising present. Please remember that bruises will spread as they heal; don't let this worry you. You do have a hematoma on your left wrist; this will take longer to heal.

## 2022-10-25 NOTE — Telephone Encounter (Signed)
Patient called to say yesterday after she left the office she fell outside of the building after her visit.  She hit the left side of her face and left arm. The security guard from the building came and took pictures.  She is going to her PCP today at 3pm to get checked out.

## 2022-10-25 NOTE — Telephone Encounter (Signed)
LMTCB

## 2022-10-29 NOTE — Telephone Encounter (Signed)
LMTCB

## 2022-10-30 DIAGNOSIS — S0012XA Contusion of left eyelid and periocular area, initial encounter: Secondary | ICD-10-CM | POA: Diagnosis not present

## 2022-10-30 DIAGNOSIS — Z961 Presence of intraocular lens: Secondary | ICD-10-CM | POA: Diagnosis not present

## 2022-10-31 ENCOUNTER — Other Ambulatory Visit: Payer: Self-pay | Admitting: Cardiovascular Disease

## 2022-10-31 ENCOUNTER — Other Ambulatory Visit: Payer: Self-pay | Admitting: Family

## 2022-10-31 DIAGNOSIS — F411 Generalized anxiety disorder: Secondary | ICD-10-CM

## 2022-11-01 ENCOUNTER — Encounter: Payer: Self-pay | Admitting: Adult Health

## 2022-11-01 ENCOUNTER — Ambulatory Visit (INDEPENDENT_AMBULATORY_CARE_PROVIDER_SITE_OTHER): Payer: HMO | Admitting: Adult Health

## 2022-11-01 VITALS — BP 128/70 | HR 64 | Temp 97.9°F | Ht 65.5 in | Wt 184.0 lb

## 2022-11-01 DIAGNOSIS — W19XXXS Unspecified fall, sequela: Secondary | ICD-10-CM

## 2022-11-01 DIAGNOSIS — M25532 Pain in left wrist: Secondary | ICD-10-CM | POA: Diagnosis not present

## 2022-11-01 DIAGNOSIS — I25118 Atherosclerotic heart disease of native coronary artery with other forms of angina pectoris: Secondary | ICD-10-CM

## 2022-11-01 DIAGNOSIS — F339 Major depressive disorder, recurrent, unspecified: Secondary | ICD-10-CM

## 2022-11-01 DIAGNOSIS — R519 Headache, unspecified: Secondary | ICD-10-CM | POA: Diagnosis not present

## 2022-11-01 NOTE — Progress Notes (Addendum)
[facility]  Provider:  Durenda Age DNP  Code Status:  Full Code  Goals of Care:     06/21/2022   10:22 AM  Advanced Directives  Does Patient Have a Medical Advance Directive? Yes  Type of Paramedic of [place];Living will;Out of facility DNR (pink MOST or yellow form)  Does patient want to make changes to medical advance directive? No - Patient declined  Copy of [place] in Chart? No - copy requested     Chief Complaint  Patient presents with   Acute Visit    Patient presents today for left wrist/ arm pain from a post fall. She reports noticing a knot on the left wrist. She report pain level 2-5 and taking tylenol at bedtime for pain.    HPI: Patient is a 89 y.o. female seen today for an acute visit for left wrist pain. She reported falling on 10/24/22. She rated her pain as 2-5/10. She takes Tylenol at night for pain. BP 128/70 and HR 64.  She denies chest pains. She takes IMDUR for CAD.  She takes Zoloft for depression. Denies feeling depressed.  Past Medical History:  Diagnosis Date   Arthritis    right knee   Cancer ([place]) yrs ago   skin cancer removed from face   Chronic diastolic CHF (congestive heart failure) (HCC)    Chronic venous insufficiency    LEA VENOUS, 10/17/2011 - mild reflux in bilateral common femoral veins   CKD (chronic kidney disease), stage III ([place])    Coronary artery disease    a. s/p PCI/BMS to prox LAD and balloon angioplasty to mLAD with suboptimal result in 2000. b. Abnl nuc 2012, cath 09/2011 - showed that the overall territory of potential ischemia was small and attributable to a moderately diseased small second diagonal artery. Med rx.   Dyspnea    with activity   Headache    History of blood transfusion 2017   Hyperlipidemia    Hypertension    Hypertensive heart disease    Hypothyroidism    Obesity    Pneumonia    several times   Stroke [facility])    pt. states she had "light stroke"  in sept. 1980   Urinary incontinence     Past Surgical History:  Procedure Laterality Date   ABDOMINAL HYSTERECTOMY  1970's   complete   ANKLE ARTHROSCOPY Right 07/10/2017   Procedure: ANKLE ARTHROSCOPY;  Surgeon: [name], DPM;  Location: [place];  Service: Podiatry;  Laterality: Right;   BACK SURGERY  07/26/2016   cervical neck surgery, [facility]   BALLOON DILATION N/A 12/01/2021   Procedure: BALLOON DILATION;  Surgeon: Carol Ada, MD;  [facility];  Service: Gastroenterology;  Laterality: N/A;   BLADDER SURGERY  2010   WITH MESH  bladder tach   bunion removal surgery Bilateral 15 yrs ago   CARDIAC CATHETERIZATION Left 09/25/2011   Medical management   CARDIAC CATHETERIZATION Left 03/25/2001   Normal LV function, LAD residual narrowing of less than 10%, normal ramus intermediate, circumflex, and RCA,    CARDIAC CATHETERIZATION  09/04/1999   LAD, 3x21m Tetra stent resulting in a reduction of the 80% stenosis to 0% residual   CARDIAC CATHETERIZATION N/A 01/26/2015   Procedure: Right/Left Heart Cath and Coronary Angiography;  Surgeon: TTroy Sine MD;  [facility];  Service: Cardiovascular;  Laterality: N/A;   CARDIAC CATHETERIZATION N/A 01/27/2015   Procedure: Intravascular Pressure Wire/FFR  Study;  Surgeon: Burnell Blanks, MD;  Location: [place] [facility];  Service: Cardiovascular;  Laterality: N/A;   CARDIAC CATHETERIZATION N/A 01/27/2015   Procedure: Right Heart Cath;  Surgeon: Burnell Blanks, MD;  Location: [place] [facility];  Service: Cardiovascular;  Laterality: N/A;   CHOLECYSTECTOMY     COLONOSCOPY WITH PROPOFOL N/A 03/07/2017   Procedure: COLONOSCOPY WITH PROPOFOL;  Surgeon: Juanita Craver, MD;  [facility];  Service: Endoscopy;  Laterality: N/A;   CORONARY STENT PLACEMENT     LAD x 1   ESOPHAGOGASTRODUODENOSCOPY (EGD) WITH PROPOFOL N/A 12/01/2021   Procedure: ESOPHAGOGASTRODUODENOSCOPY (EGD) WITH  PROPOFOL;  Surgeon: Carol Ada, MD;  [facility];  Service: Gastroenterology;  Laterality: N/A;   ganglion cyst removal  yrs ago   x 2   ILIAC VEIN ANGIOPLASTY / STENTING  02/15/2015   INTRAVASCULAR PRESSURE WIRE/FFR STUDY N/A 04/05/2017   Procedure: Intravascular Pressure Wire/FFR Study;  Surgeon: Martinique, Peter M, MD;  Location: [place] [facility];  Service: Cardiovascular;  Laterality: N/A;   IVC FILTER INSERTION  2016   IVC FILTER REMOVAL  02/15/2015   at [place] CATH AND CORONARY ANGIOGRAPHY N/A 04/05/2017   Procedure: Left Heart Cath and Coronary Angiography;  Surgeon: Martinique, Peter M, MD;  Location: [place] [facility];  Service: Cardiovascular;  Laterality: N/A;   LEFT HEART CATHETERIZATION WITH CORONARY ANGIOGRAM N/A 09/25/2011   Procedure: LEFT HEART CATHETERIZATION WITH CORONARY ANGIOGRAM;  Surgeon: Sanda Klein, MD;  Location: [place] [facility];  Service: Cardiovascular;  Laterality: N/A;   multiple bladder surgeries to remove mesh     ROTATOR CUFF REPAIR Right    stent to groin Left 08/2014   left leg   TENDON REPAIR Right 07/10/2017   Procedure: RIGHT PERONEAL TENDON REPAIR;  Surgeon: [name], DPM;  Location: [place];  Service: Podiatry;  Laterality: Right;   TENDON REPAIR Right 05/27/2019   Procedure: PERONEAL TENDON REPAIR x2;  Surgeon: [name], DPM;  [facility];  Service: Podiatry;  Laterality: Right;    Allergies  Allergen Reactions   Atorvastatin Anaphylaxis and Other (See Comments)    Patient does not remember; caused pneumonia- took her off of it per patient   Penicillins Anaphylaxis, Hives, Swelling and Other (See Comments)    Tongue swelling Did it involve swelling of the face/tongue/throat, SOB, or low BP? Yes Did it involve sudden or severe rash/hives, skin peeling, or any reaction on the inside of your mouth or nose? Yes Did you need to seek medical attention at a hospital or doctor's office? Yes When did it last happen?  63  or 89 years old    If all above answers are "NO", may proceed with cephalosporin use.    Shellfish Allergy Anaphylaxis, Nausea And Vomiting and Other (See Comments)    Severe nausea and vomiting   Aminophylline Itching, Swelling, Rash and Other (See Comments)    Pt experienced burning urination, itching, redness/rash, and swelling of genitals after given this medication via IV push.   Iodinated Contrast Media Swelling and Other (See Comments)    FLUSHING, also; LLE became swollen   Latex Other (See Comments)    Causes blisters   Oxybutynin Chloride Other (See Comments)    "blisters"   Propoxyphene Other (See Comments)    Unknown reaction   Vancomycin Hives and Other (See Comments)    06/02/2018- Hives arm, back and chest   Adhesive [Tape] Rash and Other (See  Comments)    Paper tape is ok   Betadine [Povidone Iodine] Rash   Ciprofloxacin Hives   Codeine Nausea And Vomiting    .   Ranolazine Other (See Comments)    Constipation  .    Outpatient Encounter Medications as of 11/01/2022  Medication Sig   amLODipine (NORVASC) 2.5 MG tablet TAKE ONE TABLET BY MOUTH EVERY MORNING   cholecalciferol (VITAMIN D3) 25 MCG (1000 UNIT) tablet Take 1,000 Units by mouth daily.    ezetimibe (ZETIA) 10 MG tablet TAKE ONE TABLET BY MOUTH EVERY MORNING   isosorbide mononitrate (IMDUR) 30 MG 24 hr tablet Take 1 tablet (30 mg total) by mouth 2 (two) times daily.   ketoconazole (NIZORAL) 2 % shampoo Apply 1 application  topically daily as needed for irritation (or itching- SHAMPOO).   KLOR-CON M20 20 MEQ tablet Take 1 tablet (20 mEq total) by mouth 2 (two) times daily.   latanoprost (XALATAN) 0.005 % ophthalmic solution Place 1 drop into both eyes at bedtime.   levothyroxine (SYNTHROID) 88 MCG tablet TAKE ONE TABLET BY MOUTH BEFORE BREAKFAST   pravastatin (PRAVACHOL) 80 MG tablet Take 80 mg by mouth at bedtime.   sertraline (ZOLOFT) 25 MG tablet TAKE ONE TABLET BY MOUTH ONCE DAILY   spironolactone  (ALDACTONE) 25 MG tablet Take 1 tablet (25 mg total) by mouth daily.   torsemide (DEMADEX) 100 MG tablet 195 lbs OR less ONE-HALF tab IN THE MORNING & IN THE EVENING. 196lbs OR higher ONE tab IN THE MORNING & ONE-HALF tab IN THE EVENING   TYLENOL 8 HOUR ARTHRITIS PAIN 650 MG CR tablet Take 650 mg by mouth every 8 (eight) hours as needed for pain.   vitamin C (ASCORBIC ACID) 500 MG tablet Take 500 mg by mouth daily.   No facility-administered encounter medications on file as of 11/01/2022.    Review of Systems:  Review of Systems  Constitutional:  Negative for appetite change, chills, fatigue and fever.  HENT:  Negative for congestion, hearing loss, rhinorrhea and sore throat.   Eyes: Negative.   Respiratory:  Negative for cough, shortness of breath and wheezing.   Cardiovascular:  Negative for chest pain, palpitations and leg swelling.  Gastrointestinal:  Negative for abdominal pain, constipation, diarrhea, nausea and vomiting.  Genitourinary:  Negative for dysuria.  Musculoskeletal:  Negative for arthralgias, back pain and myalgias.       Left  wrist pain  Skin:  Negative for color change, rash and wound.  Neurological:  Negative for dizziness, weakness and headaches.  Psychiatric/Behavioral:  Negative for behavioral problems. The patient is not nervous/anxious.     Health Maintenance  Topic Date Due   Medicare Annual Wellness (AWV)  06/22/2023   DTaP/Tdap/Td (2 - Td or Tdap) 12/18/2025   Pneumonia Vaccine 30+ Years old  Completed   DEXA SCAN  Completed   HPV VACCINES  Aged Out   INFLUENZA VACCINE  Discontinued   COVID-19 Vaccine  Discontinued   Zoster Vaccines- Shingrix  Discontinued    Physical Exam: Vitals:   11/01/22 1458  BP: 128/70  Pulse: 64  Temp: 97.9 F (36.6 C)  SpO2: 95%  Weight: 184 lb (83.5 kg)  Height: 5' 5.5" (1.664 m)   Body mass index is 30.15 kg/m. Physical Exam Constitutional:      General: She is not in acute distress.    Appearance: She is  obese.  HENT:     Head: Normocephalic and atraumatic.     Nose: Nose normal.  Mouth/Throat:     Mouth: Mucous membranes are moist.  Eyes:     Conjunctiva/sclera: Conjunctivae normal.  Cardiovascular:     Rate and Rhythm: Normal rate and regular rhythm.  Pulmonary:     Effort: Pulmonary effort is normal.     Breath sounds: Normal breath sounds.  Abdominal:     General: Bowel sounds are normal.     Palpations: Abdomen is soft.  Musculoskeletal:        General: Normal range of motion.     Cervical back: Normal range of motion.     Comments: Left wrist tenderness  Skin:    General: Skin is warm and dry.  Neurological:     Mental Status: She is alert. Mental status is at baseline.  Psychiatric:        Mood and Affect: Mood normal.        Behavior: Behavior normal.     Labs reviewed: Basic Metabolic Panel: Recent Labs    11/02/21 0938 12/13/21 1043 03/07/22 1014 03/08/22 1416 03/09/22 0439 04/10/22 1112 05/04/22 1410 09/04/22 0938  NA  --    < > 140 137   < > 140 143 142  K  --    < > 2.5* 2.2*   < > 5.2 4.2 3.4*  CL  --    < > 90* 89*   < > 105 100 101  CO2  --    < > 35* 37*   < > 31 28 33*  GLUCOSE  --    < > 100* 111*   < > 98 94 101*  BUN  --    < > 43* 40*   < > 9 15 18  $ CREATININE  --    < > 1.15* 1.17*   < > 0.77 0.88 0.82  CALCIUM  --    < > 9.9 10.4*   < > 9.4 9.5 9.5  MG  --   --   --  2.1  --   --   --   --   TSH 3.06  --  1.02  --   --   --   --  0.08*   < > = values in this interval not displayed.   Liver Function Tests: Recent Labs    03/08/22 1416 03/09/22 0439 09/04/22 0938  AST 51* 42* 32  ALT 32 27 15  ALKPHOS 72 57  --   BILITOT 1.5* 1.1 1.4*  PROT 7.2 5.7* 6.2  ALBUMIN 4.1 2.9*  --    No results for input(s): "LIPASE", "AMYLASE" in the last 8760 hours. No results for input(s): "AMMONIA" in the last 8760 hours. CBC: Recent Labs    03/08/22 1416 03/09/22 0439 03/12/22 0932 09/04/22 0938  WBC 5.7 5.0 5.2 4.2  NEUTROABS 3.7   --  3,058 2,268  HGB 16.1* 14.4 15.6* 14.8  HCT 47.0* 42.3 45.6* 43.4  MCV 96.1 97.9 98.5 97.5  PLT 113* 90* 122* 117*   Lipid Panel: Recent Labs    03/07/22 1014 09/04/22 0938  CHOL 139 162  HDL 71 52  LDLCALC 52 87  TRIG 82 131  CHOLHDL 2.0 3.1   Lab Results  Component Value Date   HGBA1C 6.1 (H) 01/26/2015    Procedures since last visit: No results found.  Assessment/Plan  1. Left wrist pain -  S/P fall -  continue Tylenol as needed - DG Wrist Complete Left to rule out fracture  2. Coronary artery disease involving native  coronary artery of native heart with other form of angina pectoris ([place]) -  denies chest pains, stable -  continue IMDUR  3. Major depression, recurrent, chronic (HCC) -  mood is stable, continue Zoloft   Labs/tests ordered:  x-ray  of left wrist  Next appt:  03/04/2023   Addendum:  11/08/2022  Patient called [facility] clinic and complaining of bilateral wrist pain and headache. Added order for right wrist imaging and CT head without contrast.

## 2022-11-01 NOTE — Patient Instructions (Signed)
Wrist Pain, Adult There are many things that can cause wrist pain. Some common causes include: An injury to the wrist. Using the joint too much. A condition that causes too much pressure to be put on a nerve in the wrist (carpal tunnel syndrome). Wear and tear of the joints that happens as a person gets older (osteoarthritis). A condition that causes swelling and stiffness in the joints (arthritis). Sometimes, the cause of wrist pain is not known. Often, the pain goes away when you follow your doctor's instructions for easing pain at home. This may include resting your wrist, icing your wrist, or using a splint or an elastic wrap for a short time. It is important to tell your doctor if your wrist pain does not go away. Follow these instructions at home: If you have a splint or elastic wrap: Wear the splint or wrap as told by your doctor. Take it off only as told by your doctor. Ask if you can take it off for bathing. Loosen the splint or wrap if your fingers: Tingle. Become numb. Turn cold and blue. Check the skin around the splint or wrap every day. Tell your doctor about any concerns. Keep the splint or wrap clean. If the splint or wrap is not waterproof: Do not let it get wet. Cover it with a watertight covering when you take a bath or shower. Managing pain, stiffness, and swelling  If told, put ice on the painful area. To do this: If you have a removable splint or wrap, take it off as told by your doctor. Put ice in a plastic bag. Place a towel between your skin and the bag or between your splint or wrap and the bag. Leave the ice on for 20 minutes, 2-3 times a day. Move your fingers often. Raise (elevate) the injured area above the level of your heart while you are sitting or lying down. Activity Rest your wrist as told by your doctor. Return to your normal activities as told by your doctor. Ask your doctor what activities are safe for you. Ask your doctor when it is safe to  drive if you have a splint or wrap on your wrist. Do exercises as told by your doctor. General instructions Pay attention to any changes in your symptoms. Take over-the-counter and prescription medicines only as told by your doctor. Keep all follow-up visits as told by your doctor. This is important. Contact a doctor if: You have a sudden, sharp pain in the wrist, hand, or arm that is different or new. The swelling or bruising on your wrist or hand gets worse. Your skin: Becomes red. Gets a rash. Has open sores. Your pain does not get better. Your pain gets worse. You have a fever or chills. Get help right away if: You lose feeling in your fingers or hand. Your fingers turn white, very red, or cold and blue. You cannot move your fingers. Summary There are many things that can cause wrist pain. It is important to tell your doctor if your wrist pain does not go away. You may need to wear a splint or a wrap for a short period of time. Return to your normal activities as told by your doctor. Ask your doctor what activities are safe for you. This information is not intended to replace advice given to you by your health care provider. Make sure you discuss any questions you have with your health care provider. Document Revised: 04/09/2022 Document Reviewed: 07/23/2019 Elsevier Patient Education  2023  [employer].

## 2022-11-02 DIAGNOSIS — M533 Sacrococcygeal disorders, not elsewhere classified: Secondary | ICD-10-CM | POA: Diagnosis not present

## 2022-11-02 NOTE — Telephone Encounter (Signed)
Patient stated she is doing better. She fell on the concrete in the parking lot after her [facility] visit on 2/8. She has been to eye doctor and PCP.  She stated that 2 days ago, she was bending over in the kitchen, and raised up and nearly fell backward, but caught herself. Education of fall prevention (slow movements, staying close to the wall, etc.).

## 2022-11-02 NOTE — Telephone Encounter (Signed)
LMTCB, that we are concerned about her.

## 2022-11-08 ENCOUNTER — Telehealth: Payer: Self-pay

## 2022-11-08 ENCOUNTER — Telehealth: Payer: HMO

## 2022-11-08 NOTE — Telephone Encounter (Signed)
Call the patient this afternoon and she states that she has any thing about appointment for her x-ray and that she wanted know if she can get a CT Scan done as well because she has been having pain.

## 2022-11-08 NOTE — Telephone Encounter (Signed)
Patient Social worker call from [place] and the social worker states that the patient was having pain in both wrist and having headaches and want to know about the order for the x-ray and also want to see if [facility] can schedule a MRI or CT Scan.

## 2022-11-08 NOTE — Addendum Note (Signed)
Addended by: Durenda Age C on: 11/08/2022 04:36 PM   Modules accepted: Orders

## 2022-11-09 NOTE — Telephone Encounter (Signed)
Patient is going to make appointment

## 2022-11-12 ENCOUNTER — Other Ambulatory Visit: Payer: Self-pay

## 2022-11-12 ENCOUNTER — Other Ambulatory Visit: Payer: Self-pay | Admitting: Surgery

## 2022-11-12 ENCOUNTER — Emergency Department (HOSPITAL_COMMUNITY): Payer: PPO

## 2022-11-12 ENCOUNTER — Ambulatory Visit
Admission: RE | Admit: 2022-11-12 | Discharge: 2022-11-12 | Disposition: A | Discharge status: Home / Self Care | Payer: PPO | Source: Ambulatory Visit | Attending: Adult Health | Admitting: Adult Health

## 2022-11-12 ENCOUNTER — Other Ambulatory Visit (HOSPITAL_COMMUNITY): Payer: Self-pay

## 2022-11-12 ENCOUNTER — Inpatient Hospital Stay (HOSPITAL_COMMUNITY)
Admission: EM | Admit: 2022-11-12 | Discharge: 2022-11-17 | DRG: 089 | Disposition: A | Discharge status: Skilled Nursing Facility | Payer: PPO | Attending: Family Medicine | Admitting: Family Medicine

## 2022-11-12 ENCOUNTER — Ambulatory Visit
Admission: RE | Admit: 2022-11-12 | Discharge: 2022-11-12 | Disposition: A | Discharge status: Home / Self Care | Payer: PPO | Source: Ambulatory Visit | Attending: Surgery | Admitting: Surgery

## 2022-11-12 ENCOUNTER — Encounter (HOSPITAL_COMMUNITY): Payer: Self-pay

## 2022-11-12 DIAGNOSIS — Z955 Presence of coronary angioplasty implant and graft: Secondary | ICD-10-CM | POA: Diagnosis not present

## 2022-11-12 DIAGNOSIS — S52515A Nondisplaced fracture of left radial styloid process, initial encounter for closed fracture: Secondary | ICD-10-CM | POA: Diagnosis not present

## 2022-11-12 DIAGNOSIS — S060XAD Concussion with loss of consciousness status unknown, subsequent encounter: Secondary | ICD-10-CM | POA: Diagnosis not present

## 2022-11-12 DIAGNOSIS — S060XAA Concussion with loss of consciousness status unknown, initial encounter: Secondary | ICD-10-CM | POA: Diagnosis present

## 2022-11-12 DIAGNOSIS — E785 Hyperlipidemia, unspecified: Secondary | ICD-10-CM | POA: Diagnosis present

## 2022-11-12 DIAGNOSIS — S4982XA Other specified injuries of left shoulder and upper arm, initial encounter: Secondary | ICD-10-CM | POA: Diagnosis not present

## 2022-11-12 DIAGNOSIS — M6259 Muscle wasting and atrophy, not elsewhere classified, multiple sites: Secondary | ICD-10-CM | POA: Diagnosis not present

## 2022-11-12 DIAGNOSIS — I25118 Atherosclerotic heart disease of native coronary artery with other forms of angina pectoris: Secondary | ICD-10-CM | POA: Diagnosis not present

## 2022-11-12 DIAGNOSIS — S42102D Fracture of unspecified part of scapula, left shoulder, subsequent encounter for fracture with routine healing: Secondary | ICD-10-CM | POA: Diagnosis not present

## 2022-11-12 DIAGNOSIS — Z808 Family history of malignant neoplasm of other organs or systems: Secondary | ICD-10-CM

## 2022-11-12 DIAGNOSIS — M533 Sacrococcygeal disorders, not elsewhere classified: Secondary | ICD-10-CM

## 2022-11-12 DIAGNOSIS — Z66 Do not resuscitate: Secondary | ICD-10-CM | POA: Diagnosis present

## 2022-11-12 DIAGNOSIS — Y9301 Activity, walking, marching and hiking: Secondary | ICD-10-CM | POA: Diagnosis present

## 2022-11-12 DIAGNOSIS — S0003XA Contusion of scalp, initial encounter: Secondary | ICD-10-CM | POA: Diagnosis not present

## 2022-11-12 DIAGNOSIS — S42102A Fracture of unspecified part of scapula, left shoulder, initial encounter for closed fracture: Secondary | ICD-10-CM

## 2022-11-12 DIAGNOSIS — S42192A Fracture of other part of scapula, left shoulder, initial encounter for closed fracture: Secondary | ICD-10-CM | POA: Diagnosis present

## 2022-11-12 DIAGNOSIS — S52515D Nondisplaced fracture of left radial styloid process, subsequent encounter for closed fracture with routine healing: Secondary | ICD-10-CM | POA: Diagnosis not present

## 2022-11-12 DIAGNOSIS — Z86718 Personal history of other venous thrombosis and embolism: Secondary | ICD-10-CM

## 2022-11-12 DIAGNOSIS — D696 Thrombocytopenia, unspecified: Secondary | ICD-10-CM | POA: Diagnosis present

## 2022-11-12 DIAGNOSIS — Z885 Allergy status to narcotic agent status: Secondary | ICD-10-CM

## 2022-11-12 DIAGNOSIS — S0101XA Laceration without foreign body of scalp, initial encounter: Secondary | ICD-10-CM | POA: Diagnosis present

## 2022-11-12 DIAGNOSIS — R42 Dizziness and giddiness: Secondary | ICD-10-CM | POA: Diagnosis present

## 2022-11-12 DIAGNOSIS — M25531 Pain in right wrist: Secondary | ICD-10-CM | POA: Diagnosis not present

## 2022-11-12 DIAGNOSIS — E876 Hypokalemia: Secondary | ICD-10-CM | POA: Diagnosis present

## 2022-11-12 DIAGNOSIS — I130 Hypertensive heart and chronic kidney disease with heart failure and stage 1 through stage 4 chronic kidney disease, or unspecified chronic kidney disease: Secondary | ICD-10-CM | POA: Diagnosis present

## 2022-11-12 DIAGNOSIS — I5032 Chronic diastolic (congestive) heart failure: Secondary | ICD-10-CM | POA: Diagnosis present

## 2022-11-12 DIAGNOSIS — M1711 Unilateral primary osteoarthritis, right knee: Secondary | ICD-10-CM | POA: Diagnosis present

## 2022-11-12 DIAGNOSIS — W109XXA Fall (on) (from) unspecified stairs and steps, initial encounter: Secondary | ICD-10-CM | POA: Diagnosis present

## 2022-11-12 DIAGNOSIS — E039 Hypothyroidism, unspecified: Secondary | ICD-10-CM | POA: Diagnosis present

## 2022-11-12 DIAGNOSIS — Z9181 History of falling: Secondary | ICD-10-CM

## 2022-11-12 DIAGNOSIS — S199XXA Unspecified injury of neck, initial encounter: Secondary | ICD-10-CM | POA: Diagnosis not present

## 2022-11-12 DIAGNOSIS — I872 Venous insufficiency (chronic) (peripheral): Secondary | ICD-10-CM | POA: Diagnosis present

## 2022-11-12 DIAGNOSIS — R0902 Hypoxemia: Secondary | ICD-10-CM | POA: Diagnosis not present

## 2022-11-12 DIAGNOSIS — R1312 Dysphagia, oropharyngeal phase: Secondary | ICD-10-CM | POA: Diagnosis not present

## 2022-11-12 DIAGNOSIS — I10 Essential (primary) hypertension: Secondary | ICD-10-CM | POA: Diagnosis not present

## 2022-11-12 DIAGNOSIS — R402412 Glasgow coma scale score 13-15, at arrival to emergency department: Secondary | ICD-10-CM | POA: Diagnosis present

## 2022-11-12 DIAGNOSIS — S0990XA Unspecified injury of head, initial encounter: Secondary | ICD-10-CM | POA: Diagnosis present

## 2022-11-12 DIAGNOSIS — Y92014 Private driveway to single-family (private) house as the place of occurrence of the external cause: Secondary | ICD-10-CM | POA: Diagnosis not present

## 2022-11-12 DIAGNOSIS — Z91041 Radiographic dye allergy status: Secondary | ICD-10-CM

## 2022-11-12 DIAGNOSIS — R102 Pelvic and perineal pain: Secondary | ICD-10-CM | POA: Diagnosis not present

## 2022-11-12 DIAGNOSIS — Z85828 Personal history of other malignant neoplasm of skin: Secondary | ICD-10-CM

## 2022-11-12 DIAGNOSIS — I739 Peripheral vascular disease, unspecified: Secondary | ICD-10-CM | POA: Diagnosis present

## 2022-11-12 DIAGNOSIS — S060X0A Concussion without loss of consciousness, initial encounter: Secondary | ICD-10-CM | POA: Diagnosis present

## 2022-11-12 DIAGNOSIS — S0101XD Laceration without foreign body of scalp, subsequent encounter: Secondary | ICD-10-CM | POA: Diagnosis not present

## 2022-11-12 DIAGNOSIS — Z8249 Family history of ischemic heart disease and other diseases of the circulatory system: Secondary | ICD-10-CM

## 2022-11-12 DIAGNOSIS — Z881 Allergy status to other antibiotic agents status: Secondary | ICD-10-CM

## 2022-11-12 DIAGNOSIS — E782 Mixed hyperlipidemia: Secondary | ICD-10-CM

## 2022-11-12 DIAGNOSIS — R519 Headache, unspecified: Secondary | ICD-10-CM | POA: Diagnosis present

## 2022-11-12 DIAGNOSIS — Z79890 Hormone replacement therapy (postmenopausal): Secondary | ICD-10-CM

## 2022-11-12 DIAGNOSIS — N200 Calculus of kidney: Secondary | ICD-10-CM | POA: Diagnosis not present

## 2022-11-12 DIAGNOSIS — R079 Chest pain, unspecified: Secondary | ICD-10-CM | POA: Diagnosis not present

## 2022-11-12 DIAGNOSIS — M25512 Pain in left shoulder: Secondary | ICD-10-CM | POA: Diagnosis not present

## 2022-11-12 DIAGNOSIS — S52512A Displaced fracture of left radial styloid process, initial encounter for closed fracture: Secondary | ICD-10-CM | POA: Diagnosis present

## 2022-11-12 DIAGNOSIS — Z833 Family history of diabetes mellitus: Secondary | ICD-10-CM

## 2022-11-12 DIAGNOSIS — Z9049 Acquired absence of other specified parts of digestive tract: Secondary | ICD-10-CM

## 2022-11-12 DIAGNOSIS — Z23 Encounter for immunization: Secondary | ICD-10-CM | POA: Diagnosis present

## 2022-11-12 DIAGNOSIS — M25361 Other instability, right knee: Secondary | ICD-10-CM | POA: Diagnosis present

## 2022-11-12 DIAGNOSIS — K7460 Unspecified cirrhosis of liver: Secondary | ICD-10-CM | POA: Diagnosis not present

## 2022-11-12 DIAGNOSIS — I2510 Atherosclerotic heart disease of native coronary artery without angina pectoris: Secondary | ICD-10-CM | POA: Diagnosis present

## 2022-11-12 DIAGNOSIS — M79602 Pain in left arm: Secondary | ICD-10-CM | POA: Diagnosis not present

## 2022-11-12 DIAGNOSIS — R2689 Other abnormalities of gait and mobility: Secondary | ICD-10-CM | POA: Diagnosis not present

## 2022-11-12 DIAGNOSIS — Z90710 Acquired absence of both cervix and uterus: Secondary | ICD-10-CM

## 2022-11-12 DIAGNOSIS — M6281 Muscle weakness (generalized): Secondary | ICD-10-CM | POA: Diagnosis not present

## 2022-11-12 DIAGNOSIS — Z880 Allergy status to penicillin: Secondary | ICD-10-CM

## 2022-11-12 DIAGNOSIS — Z79899 Other long term (current) drug therapy: Secondary | ICD-10-CM

## 2022-11-12 DIAGNOSIS — Z8673 Personal history of transient ischemic attack (TIA), and cerebral infarction without residual deficits: Secondary | ICD-10-CM | POA: Diagnosis not present

## 2022-11-12 DIAGNOSIS — Z888 Allergy status to other drugs, medicaments and biological substances status: Secondary | ICD-10-CM

## 2022-11-12 DIAGNOSIS — S42035A Nondisplaced fracture of lateral end of left clavicle, initial encounter for closed fracture: Secondary | ICD-10-CM | POA: Diagnosis not present

## 2022-11-12 DIAGNOSIS — Z741 Need for assistance with personal care: Secondary | ICD-10-CM | POA: Diagnosis not present

## 2022-11-12 DIAGNOSIS — W19XXXS Unspecified fall, sequela: Secondary | ICD-10-CM

## 2022-11-12 DIAGNOSIS — W19XXXA Unspecified fall, initial encounter: Secondary | ICD-10-CM | POA: Diagnosis not present

## 2022-11-12 DIAGNOSIS — M25532 Pain in left wrist: Secondary | ICD-10-CM

## 2022-11-12 LAB — CBC WITH DIFFERENTIAL/PLATELET
Abs Immature Granulocytes: 0.05 10*3/uL (ref 0.00–0.07)
Basophils Absolute: 0 10*3/uL (ref 0.0–0.1)
Basophils Relative: 0 %
Eosinophils Absolute: 0 10*3/uL (ref 0.0–0.5)
Eosinophils Relative: 1 %
HCT: 41.4 % (ref 36.0–46.0)
Hemoglobin: 13.4 g/dL (ref 12.0–15.0)
Immature Granulocytes: 1 %
Lymphocytes Relative: 9 %
Lymphs Abs: 0.7 10*3/uL (ref 0.7–4.0)
MCH: 33.2 pg (ref 26.0–34.0)
MCHC: 32.4 g/dL (ref 30.0–36.0)
MCV: 102.5 fL — ABNORMAL HIGH (ref 80.0–100.0)
Monocytes Absolute: 0.5 10*3/uL (ref 0.1–1.0)
Monocytes Relative: 6 %
Neutro Abs: 6.2 10*3/uL (ref 1.7–7.7)
Neutrophils Relative %: 83 %
Platelets: 79 10*3/uL — ABNORMAL LOW (ref 150–400)
RBC: 4.04 MIL/uL (ref 3.87–5.11)
RDW: 13.8 % (ref 11.5–15.5)
WBC: 7.4 10*3/uL (ref 4.0–10.5)
nRBC: 0 % (ref 0.0–0.2)

## 2022-11-12 LAB — COMPREHENSIVE METABOLIC PANEL
ALT: 17 U/L (ref 0–44)
AST: 40 U/L (ref 15–41)
Albumin: 2.9 g/dL — ABNORMAL LOW (ref 3.5–5.0)
Alkaline Phosphatase: 79 U/L (ref 38–126)
Anion gap: 12 (ref 5–15)
BUN: 18 mg/dL (ref 8–23)
CO2: 24 mmol/L (ref 22–32)
Calcium: 8.7 mg/dL — ABNORMAL LOW (ref 8.9–10.3)
Chloride: 105 mmol/L (ref 98–111)
Creatinine, Ser: 0.75 mg/dL (ref 0.44–1.00)
GFR, Estimated: >60 mL/min (ref >60)
Glucose, Bld: 149 mg/dL — ABNORMAL HIGH (ref 70–99)
Potassium: 3.5 mmol/L (ref 3.5–5.1)
Sodium: 141 mmol/L (ref 135–145)
Total Bilirubin: 1.2 mg/dL (ref 0.3–1.2)
Total Protein: 5.6 g/dL — ABNORMAL LOW (ref 6.5–8.1)

## 2022-11-12 LAB — CBG MONITORING, ED: Glucose-Capillary: 135 mg/dL — ABNORMAL HIGH (ref 70–99)

## 2022-11-12 MED ORDER — LACTATED RINGERS IV BOLUS
500.0000 mL | Freq: Once | INTRAVENOUS | Status: AC
  Administered 2022-11-12: 500 mL via INTRAVENOUS

## 2022-11-12 MED ORDER — HYDROMORPHONE HCL 1 MG/ML IJ SOLN
0.5000 mg | Freq: Once | INTRAMUSCULAR | Status: AC
  Administered 2022-11-12: 0.5 mg via INTRAVENOUS
  Filled 2022-11-12: qty 1

## 2022-11-12 MED ORDER — METHYLPREDNISOLONE SODIUM SUCC 40 MG IJ SOLR
40.0000 mg | Freq: Once | INTRAMUSCULAR | Status: AC
  Administered 2022-11-12: 40 mg via INTRAVENOUS
  Filled 2022-11-12: qty 1

## 2022-11-12 MED ORDER — ONDANSETRON HCL 4 MG/2ML IJ SOLN
4.0000 mg | Freq: Once | INTRAMUSCULAR | Status: AC
  Administered 2022-11-12: 4 mg via INTRAVENOUS
  Filled 2022-11-12: qty 2

## 2022-11-12 MED ORDER — DIPHENHYDRAMINE HCL 50 MG/ML IJ SOLN: 50.0000 mg | Freq: Once | INTRAMUSCULAR | Status: DC

## 2022-11-12 MED ORDER — DIPHENHYDRAMINE HCL 25 MG PO CAPS: 50.0000 mg | ORAL_CAPSULE | Freq: Once | ORAL | Status: DC

## 2022-11-12 MED ORDER — TETANUS-DIPHTH-ACELL PERTUSSIS 5-2.5-18.5 LF-MCG/0.5 IM SUSY
0.5000 mL | PREFILLED_SYRINGE | Freq: Once | INTRAMUSCULAR | Status: AC
  Administered 2022-11-12: 0.5 mL via INTRAMUSCULAR
  Filled 2022-11-12: qty 0.5

## 2022-11-12 MED ORDER — FENTANYL CITRATE PF 50 MCG/ML IJ SOSY
50.0000 ug | PREFILLED_SYRINGE | Freq: Once | INTRAMUSCULAR | Status: AC
  Administered 2022-11-12: 50 ug via INTRAVENOUS
  Filled 2022-11-12: qty 1

## 2022-11-12 NOTE — ED Notes (Signed)
C-COLLAR in place by EMS

## 2022-11-12 NOTE — ED Provider Triage Note (Signed)
Emergency Medicine Provider Triage Evaluation Note  Jill Preston , a 89 y.o. female  was evaluated in triage.  Pt complains of mechanical fall down 2 steps outside onto asphalt.  [place] [address] striking her left shoulder and posterior scalp.  No loss of consciousness.  Complains of pain to left shoulder has hematoma and laceration to the occipital scalp.  No blood thinner use.  No syncope..  Review of Systems  Positive: Fall, shoulder pain, head injury Negative: Chest pain, shortness of breath, syncope  Physical Exam  BP (!) 103/59   Pulse 74   Temp 98.4 F (36.9 C) (Oral)   Resp 18   Ht 5' 5.5" (1.664 m)   Wt 78.9 kg   BMI 28.51 kg/m  Gen:   Awake, no distress   Resp:  Normal effort  MSK:   Moves extremities without difficulty  Other:  Left proximal shoulder pain without obvious deformity.  Intact radial pulse.  Occipital scalp hematoma with laceration/avulsion.  Medical Decision Making  Medically screening exam initiated at 3:49 PM.  Appropriate orders placed.  [name] was informed that the remainder of the evaluation will be completed by another provider, this initial triage assessment does not replace that evaluation, and the importance of remaining in the ED until their evaluation is complete.  Imaging ordered.   Ezequiel Essex, MD 11/12/22 [phone]

## 2022-11-12 NOTE — ED Provider Notes (Signed)
[facility] Provider Note   CSN: [id number] Arrival date & time: 11/12/22  1536     History  Chief Complaint  Patient presents with   Fall   Head Injury   Shoulder Injury    left    Jill Preston is a 89 y.o. female.  This is a 89 year old female with history of CAD status post stent, CHF, hypertension, CKD, DVT with IVC filter, hyperlipidemia presenting to the ED after a fall.  Patient was reportedly walking down 2 steps outside, lost her footing and fell onto concrete.  She fell on her left shoulder and hit the back of her head.  She denies loss of consciousness, she is complaining of left shoulder pain as well as headache.  She also has cervical paraspinal pain.  She denies any blood thinners.      Home Medications Prior to Admission medications   Medication Sig Start Date End Date Taking? Authorizing Provider  amLODipine (NORVASC) 2.5 MG tablet TAKE ONE TABLET BY MOUTH EVERY MORNING 02/01/22   Croitoru, Mihai, MD  cholecalciferol (VITAMIN D3) 25 MCG (1000 UNIT) tablet Take 1,000 Units by mouth daily.     [provider]  ezetimibe (ZETIA) 10 MG tablet TAKE ONE TABLET BY MOUTH EVERY MORNING 02/01/22   Croitoru, Dani Gobble, MD  isosorbide mononitrate (IMDUR) 30 MG 24 hr tablet Take 1 tablet (30 mg total) by mouth 2 (two) times daily. 03/02/22 02/25/23  Loel Dubonnet, NP  ketoconazole (NIZORAL) 2 % shampoo Apply 1 application  topically daily as needed for irritation (or itching- SHAMPOO). 06/27/21   [provider]  KLOR-CON M20 20 MEQ tablet Take 1 tablet (20 mEq total) by mouth 2 (two) times daily. 03/29/22   Croitoru, Mihai, MD  latanoprost (XALATAN) 0.005 % ophthalmic solution Place 1 drop into both eyes at bedtime. 10/10/22   [provider]  levothyroxine (SYNTHROID) 88 MCG tablet TAKE ONE TABLET BY MOUTH BEFORE BREAKFAST 01/31/22   [name], [name], NP  pravastatin (PRAVACHOL) 80 MG tablet Take 80 mg by  mouth at bedtime.    [provider]  sertraline (ZOLOFT) 25 MG tablet TAKE ONE TABLET BY MOUTH ONCE DAILY 10/31/22   [name], [name], NP  spironolactone (ALDACTONE) 25 MG tablet Take 1 tablet (25 mg total) by mouth daily. 03/29/22 03/24/23  Croitoru, Mihai, MD  torsemide (DEMADEX) 100 MG tablet 195 lbs OR less ONE-HALF tab IN THE MORNING & IN THE EVENING. 196lbs OR higher ONE tab IN THE MORNING & ONE-HALF tab IN THE EVENING 10/31/22   Croitoru, Mihai, MD  TYLENOL 8 HOUR ARTHRITIS PAIN 650 MG CR tablet Take 650 mg by mouth every 8 (eight) hours as needed for pain.    [provider]  vitamin C (ASCORBIC ACID) 500 MG tablet Take 500 mg by mouth daily.    [provider]      Allergies    Atorvastatin, Penicillins, Shellfish allergy, Aminophylline, Iodinated contrast media, Latex, Oxybutynin chloride, Propoxyphene, Vancomycin, Adhesive [tape], Betadine [povidone iodine], Ciprofloxacin, Codeine, and Ranolazine    Review of Systems   Review of Systems  Respiratory:  Negative for shortness of breath.   Cardiovascular:  Negative for chest pain.  Gastrointestinal:  Positive for abdominal pain.  Musculoskeletal:  Negative for back pain.  Neurological:  Positive for headaches.    Physical Exam Updated Vital Signs BP (!) 103/59   Pulse 74   Temp 98.4 F (36.9 C) (Oral)   Resp  18   Ht 5' 5.5" (1.664 m)   Wt 78.9 kg   BMI 28.51 kg/m  Physical Exam Vitals and nursing note reviewed.  HENT:     Head: Normocephalic.     Comments: Large boggy hematoma posteriorly along the scalp, no obvious large laceration    Nose: Nose normal. No congestion.     Mouth/Throat:     Mouth: Mucous membranes are moist.  Eyes:     Conjunctiva/sclera: Conjunctivae normal.     Pupils: Pupils are equal, round, and reactive to light.  Cardiovascular:     Rate and Rhythm: Normal rate and regular rhythm.     Pulses: Normal pulses.     Heart sounds: Normal heart sounds. No murmur heard.    No  friction rub. No gallop.  Pulmonary:     Effort: Pulmonary effort is normal. No respiratory distress.     Breath sounds: No wheezing or rales.  Abdominal:     Palpations: Abdomen is soft.     Tenderness: There is abdominal tenderness (Left-sided abdominal tenderness). There is no guarding or rebound.  Musculoskeletal:        General: Tenderness (Left chest wall, left shoulder, left trapezius) present. No deformity or signs of injury.  Skin:    Capillary Refill: Capillary refill takes less than 2 seconds.  Neurological:     General: No focal deficit present.     Mental Status: She is alert and oriented to person, place, and time.     ED Results / Procedures / Treatments   Labs (all labs ordered are listed, but only abnormal results are displayed) Labs Reviewed  CBG MONITORING, ED - Abnormal; Notable for the following components:      Result Value   Glucose-Capillary 135 (*)    All other components within normal limits    EKG None  Radiology CT Head Wo Contrast  Result Date: 11/12/2022 CLINICAL DATA:  Head and neck trauma. Fall down steps landing on left side. Hematoma to back of head. EXAM: CT HEAD WITHOUT CONTRAST CT CERVICAL SPINE WITHOUT CONTRAST TECHNIQUE: Multidetector CT imaging of the head and cervical spine was performed following the standard protocol without intravenous contrast. Multiplanar CT image reconstructions of the cervical spine were also generated. RADIATION DOSE REDUCTION: This exam was performed according to the departmental dose-optimization program which includes automated exposure control, adjustment of the mA and/or kV according to patient size and/or use of iterative reconstruction technique. COMPARISON:  CT head and cervical spine 06/20/2021. FINDINGS: CT HEAD FINDINGS Brain: No acute hemorrhage. Unchanged chronic small-vessel disease. Cortical gray-white differentiation is otherwise preserved. Prominence of the ventricles and sulci within normal limits for  age. No extra-axial collection. Basilar cisterns are patent. Vascular: No hyperdense vessel or unexpected calcification. Skull: No calvarial fracture or suspicious bone lesion. Skull base is unremarkable. Sinuses/Orbits: Unremarkable. Other: Large posterior scalp hematoma. CT CERVICAL SPINE FINDINGS Alignment: Normal. Skull base and vertebrae: Postoperative changes of C5-C7 ACDF. Intact hardware. No hardware associated lucency or fracture. Normal craniocervical junction. No acute cervical spine fracture. Soft tissues and spinal canal: No prevertebral fluid or swelling. No visible canal hematoma. Disc levels:  No high-grade spinal canal stenosis. Upper chest: Unremarkable. Other: Atherosclerotic calcifications of the carotid bulbs. IMPRESSION: 1. No acute intracranial abnormality. Large posterior scalp hematoma without underlying calvarial fracture. 2. No acute cervical spine fracture or traumatic listhesis. 3. Postoperative changes of C5-C7 ACDF. Intact hardware. Electronically Signed   By: Emmit Alexanders M.D.   On: 11/12/2022 17:05  CT Cervical Spine Wo Contrast  Result Date: 11/12/2022 CLINICAL DATA:  Head and neck trauma. Fall down steps landing on left side. Hematoma to back of head. EXAM: CT HEAD WITHOUT CONTRAST CT CERVICAL SPINE WITHOUT CONTRAST TECHNIQUE: Multidetector CT imaging of the head and cervical spine was performed following the standard protocol without intravenous contrast. Multiplanar CT image reconstructions of the cervical spine were also generated. RADIATION DOSE REDUCTION: This exam was performed according to the departmental dose-optimization program which includes automated exposure control, adjustment of the mA and/or kV according to patient size and/or use of iterative reconstruction technique. COMPARISON:  CT head and cervical spine 06/20/2021. FINDINGS: CT HEAD FINDINGS Brain: No acute hemorrhage. Unchanged chronic small-vessel disease. Cortical gray-white differentiation is  otherwise preserved. Prominence of the ventricles and sulci within normal limits for age. No extra-axial collection. Basilar cisterns are patent. Vascular: No hyperdense vessel or unexpected calcification. Skull: No calvarial fracture or suspicious bone lesion. Skull base is unremarkable. Sinuses/Orbits: Unremarkable. Other: Large posterior scalp hematoma. CT CERVICAL SPINE FINDINGS Alignment: Normal. Skull base and vertebrae: Postoperative changes of C5-C7 ACDF. Intact hardware. No hardware associated lucency or fracture. Normal craniocervical junction. No acute cervical spine fracture. Soft tissues and spinal canal: No prevertebral fluid or swelling. No visible canal hematoma. Disc levels:  No high-grade spinal canal stenosis. Upper chest: Unremarkable. Other: Atherosclerotic calcifications of the carotid bulbs. IMPRESSION: 1. No acute intracranial abnormality. Large posterior scalp hematoma without underlying calvarial fracture. 2. No acute cervical spine fracture or traumatic listhesis. 3. Postoperative changes of C5-C7 ACDF. Intact hardware. Electronically Signed   By: Emmit Alexanders M.D.   On: 11/12/2022 17:05   DG Shoulder Left  Result Date: 11/12/2022 CLINICAL DATA:  Left shoulder pain after fall. EXAM: LEFT SHOULDER - 2+ VIEW COMPARISON:  None Available. FINDINGS: There is no evidence of fracture or dislocation. There is no evidence of arthropathy or other focal bone abnormality. Soft tissues are unremarkable. IMPRESSION: Negative. Electronically Signed   By: Marijo Conception M.D.   On: 11/12/2022 16:44   DG Pelvis 1-2 Views  Result Date: 11/12/2022 CLINICAL DATA:  Chronic sacral pain. EXAM: PELVIS - 1-2 VIEW COMPARISON:  None Available. FINDINGS: There is no evidence of pelvic fracture or diastasis. No pelvic bone lesions are seen. IMPRESSION: Negative. Electronically Signed   By: Marijo Conception M.D.   On: 11/12/2022 16:43   DG Humerus Left  Result Date: 11/12/2022 CLINICAL DATA:  Left arm  pain after fall. EXAM: LEFT HUMERUS - 2+ VIEW COMPARISON:  None Available. FINDINGS: There is no evidence of fracture or other focal bone lesions. Soft tissues are unremarkable. IMPRESSION: Negative. Electronically Signed   By: Marijo Conception M.D.   On: 11/12/2022 16:42   DG Chest 1 View  Result Date: 11/12/2022 CLINICAL DATA:  Trauma.  Left shoulder pain. EXAM: CHEST  1 VIEW COMPARISON:  Chest radiographs 03/08/2022 and 11/16/2021 FINDINGS: Cardiac silhouette is again moderately enlarged. Moderate calcification is again seen within the aortic arch. Mild bilateral chronic interstitial thickening is unchanged. Additional unchanged left costophrenic angle linear chronic scarring. No acute airspace opacity. No pleural effusion pneumothorax. ACDF hardware again overlies the lower cervical spine. IMPRESSION: 1. No acute lung process. 2. Stable cardiomegaly and chronic interstitial thickening. Electronically Signed   By: Yvonne Kendall M.D.   On: 11/12/2022 16:42   DG Wrist Complete Left  Result Date: 11/12/2022 CLINICAL DATA:  Status post fall 3 weeks ago. Pain. Bruising and swelling. EXAM: LEFT WRIST - COMPLETE  3+ VIEW COMPARISON:  Left hand radiographs 02/17/2014 FINDINGS: There is diffuse decreased bone mineralization. There is oblique linear lucency indicating an acute fracture of the radial styloid approximately 6 mm proximal from the distal tip. No significant displacement on frontal view, however there may be up to 3 mm diastasis at the fracture line seen on oblique view. There is also a 4 x 2 mm chronic calcific density just distal to the radial styloid. Moderate to severe thumb carpometacarpal and moderate triscaphe joint space narrowing with thumb carpometacarpal subchondral sclerosis and mild peripheral osteophytosis. IMPRESSION: 1. Acute mildly displaced fracture of the radial styloid. 2. 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 Electronically Signed   By: Yvonne Kendall M.D.   On: 11/12/2022 10:17   DG Wrist Complete Right  Result Date: 11/12/2022 CLINICAL DATA:  Status post fall 3 weeks ago.  Pain. EXAM: RIGHT WRIST - COMPLETE 3+ VIEW COMPARISON:  Right hand radiographs 06/26/2021 FINDINGS: There is diffuse decreased bone mineralization. Severe thumb carpometacarpal joint space narrowing, subchondral sclerosis, and peripheral osteophytosis is similar to prior. Severe triscaphe joint space narrowing with unchanged bone-on-bone contact. There is minimal relative widening of the proximal aspect of the scaphoid interval measuring up to 3 mm compared to the lunotriquetral ligament interval measuring 1.5 mm. There is dorsal angulation of the lunate with the scapholunate angle measuring 91 degrees. No acute fracture is seen. No dislocation. IMPRESSION: 1. No acute fracture is seen. 2. Dorsal angulation of the lunate with the scapholunate angle measuring 91 degrees. This can be seen with dorsal intercalated segment instability. 3. 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 Electronically Signed   By: Yvonne Kendall M.D.   On: 11/12/2022 10:13    Procedures .Marland KitchenLaceration Repair  Date/Time: 11/13/2022 12:00 AM  Performed by: Jimmie Molly, MD Authorized by: Gareth Morgan, MD   Consent:    Consent obtained:  Verbal   Consent given by:  Patient   Risks, benefits, and alternatives were discussed: yes     Risks discussed:  Infection, pain, need for additional repair, poor cosmetic result, poor wound healing and vascular damage   Alternatives discussed:  No treatment Universal protocol:    Procedure explained and questions answered to patient or proxy's satisfaction: yes     Relevant documents present and verified: yes     Test results available: yes     Imaging studies available: yes     Immediately prior to procedure, a time out was called: yes     Patient identity confirmed:  Verbally with patient and arm  band Anesthesia:    Anesthesia method:  None Laceration details:    Location:  Scalp   Length (cm):  3 Exploration:    Hemostasis achieved with:  Direct pressure   Imaging outcome: foreign body not noted     Wound exploration: entire depth of wound visualized     Contaminated: no   Treatment:    Area cleansed with:  Saline   Amount of cleaning:  Extensive   Irrigation solution:  Sterile saline   Irrigation volume:  500 ml   Irrigation method:  Syringe   Visualized foreign bodies/material removed: no     Debridement:  None   Undermining:  None Skin repair:    Repair method:  Staples   Number of staples:  8 Approximation:    Approximation:  Close Repair type:    Repair type:  Simple Post-procedure details:    Dressing:  Bulky  dressing   Procedure completion:  Tolerated well, no immediate complications     Medications Ordered in ED Medications - No data to display  ED Course/ Medical Decision Making/ A&P                             Medical Decision Making Patient presents after a fall, she is not on blood thinners, she did not lose consciousness.  Given her age and mechanism CT head and C-spine ordered in triage as well as x-ray of left shoulder.  I personally reviewed and interpreted these images which showed no intracranial hemorrhage, no calvarial fracture, moderate to large hematoma in the posterior scalp, no fracture of the C-spine, no shoulder fracture.  After my evaluation patient does have some chest wall tenderness as well as tenderness in the left side of her abdomen.  She has minimal bruising on the left side of her abdomen, will obtain a CT chest abdomen and pelvis for further workup.  She has a contrast allergy, we will perform a noncontrast CT and if there are any concerning findings obtain a contrasted scan.  Patient given Solu-Medrol 40 mg for prophylaxis if we need to give her contrast.  [facility] labs ordered including CBC and CMP.  I personally reviewed and  interpreted patient's EKG which shows PR, QRS, QTc are normal, no acute ischemic changes when compared to prior.  I personally reviewed and interpreted patient's labs which showed no electrolyte abnormalities, hyperglycemia 149, likely stress, hemoglobin stable at 13.4, platelets low at 79.  I personally reviewed and interpreted patient's imaging which shows no intracranial hemorrhage, no calvarial fracture, large posterior scalp hematoma, no fracture of the cervical spine.  I clinically cleared patient c-collar.  Chest x-ray shows no acute finding in the lung fields, no rib fractures.  X-ray of humerus shows no acute fracture, x-ray of shoulder shows no acute fracture.  CT chest abdomen pelvis shows fracture of the inferomedial left scapular wing, no other acute findings.  Patient placed in a sling.  I was reviewing patient's imaging which showed she was seen by her PCP this morning and had x-rays of her wrist.  She has an acute mildly displaced fracture of the radial styloid on the left side, there is also some dorsal angulation of the lunate with the scaphoid lunate angle. I have reevaluated patient who is tender over her left radial styloid, she is not tender in her right hand.  Patient placed in a removable left wrist splint.  I closed patient's scalp laceration with 8 staples and achieved hemostasis, dressing applied.  We attempted to ambulate patient, who became acutely dizzy and vomited.  She is unable to sit up or ambulate without assistance secondary to dizziness.  I called discussed this patient's case with the hospitalist who agreed to admit patient to their service as she cannot ambulate and likely needs physical therapy.  Patient was stable upon admission to hospital.  Problems Addressed: Closed fracture of left scapula, unspecified part of scapula, initial encounter: acute illness or injury that poses a threat to life or bodily functions Closed nondisplaced fracture of styloid process  of left radius, initial encounter: acute illness or injury that poses a threat to life or bodily functions Laceration of scalp, initial encounter: acute illness or injury that poses a threat to life or bodily functions  Amount and/or Complexity of Data Reviewed Labs: ordered. Decision-making details documented in ED Course. Radiology: ordered and independent  interpretation performed. Decision-making details documented in ED Course. ECG/medicine tests: ordered and independent interpretation performed. Decision-making details documented in ED Course.  Risk Prescription drug management. Decision regarding hospitalization.          Final Clinical Impression(s) / ED Diagnoses Final diagnoses:  None    Rx / DC Orders ED Discharge Orders     None         Jimmie Molly, MD 11/13/22 0001    Gareth Morgan, MD 11/13/22 1640

## 2022-11-12 NOTE — ED Notes (Signed)
Attempted to ambulate pt. Pt unable to ambulate d/t dizziness. Pt began vomiting after RN assisted pt in sitting up on side of bed. RN assisted pt back to bed. MD notified. Pt's head dressing changed.

## 2022-11-12 NOTE — ED Triage Notes (Signed)
Pt BIB [facility] from home c/o fall, pt tripped and fell down two steps When outside. Resulting in landing on her left side. Possible deformity noted to left shoulder. Pt did hit head. Hematoma to back of head. No LOC, Not on blood thinner,  A&O X 4. #20 RAC. 144mg Fentanyl give BY ems.  Last VS 147/79, hr 92, cbg 136. O2 96%.

## 2022-11-12 NOTE — H&P (Incomplete)
History and Physical    Patient: Jill Preston [id number] DOB: [birth year] DOA: 11/12/2022 DOS: the patient was seen and examined on 11/12/2022 PCP: Sandrea Hughs, NP  Patient coming from: {Point_of_Origin:[address]}  Chief Complaint:  Chief Complaint  Patient presents with   Fall   Head Injury   Shoulder Injury    left   HPI: Jill Preston is a 89 y.o. female with medical history significant of ***  Review of Systems: {ROS_Text:26778} Past Medical History:  Diagnosis Date   Arthritis    right knee   Cancer ([place]) yrs ago   skin cancer removed from face   Chronic diastolic CHF (congestive heart failure) ([place])    Chronic venous insufficiency    LEA VENOUS, 10/17/2011 - mild reflux in bilateral common femoral veins   CKD (chronic kidney disease), stage III ([place])    Coronary artery disease    a. s/p PCI/BMS to prox LAD and balloon angioplasty to mLAD with suboptimal result in 2000. b. Abnl nuc 2012, cath 09/2011 - showed that the overall territory of potential ischemia was small and attributable to a moderately diseased small second diagonal artery. Med rx.   Dyspnea    with activity   Headache    History of blood transfusion 2017   Hyperlipidemia    Hypertension    Hypertensive heart disease    Hypothyroidism    Obesity    Pneumonia    several times   Stroke [facility])    pt. states she had "light stroke" in sept. 1980   Urinary incontinence    Past Surgical History:  Procedure Laterality Date   ABDOMINAL HYSTERECTOMY  1970's   complete   ANKLE ARTHROSCOPY Right 07/10/2017   Procedure: ANKLE ARTHROSCOPY;  Surgeon: [name], DPM;  Location: [facility];  Service: Podiatry;  Laterality: Right;   BACK SURGERY  07/26/2016   cervical neck surgery, [facility]   BALLOON DILATION N/A 12/01/2021   Procedure: BALLOON DILATION;  Surgeon: Carol Ada, MD;  [facility];  Service: Gastroenterology;  Laterality: N/A;   BLADDER SURGERY  2010    WITH MESH  bladder tach   bunion removal surgery Bilateral 15 yrs ago   CARDIAC CATHETERIZATION Left 09/25/2011   Medical management   CARDIAC CATHETERIZATION Left 03/25/2001   Normal LV function, LAD residual narrowing of less than 10%, normal ramus intermediate, circumflex, and RCA,    CARDIAC CATHETERIZATION  09/04/1999   LAD, 3x69m Tetra stent resulting in a reduction of the 80% stenosis to 0% residual   CARDIAC CATHETERIZATION N/A 01/26/2015   Procedure: Right/Left Heart Cath and Coronary Angiography;  Surgeon: TTroy Sine MD;  [facility];  Service: Cardiovascular;  Laterality: N/A;   CARDIAC CATHETERIZATION N/A 01/27/2015   Procedure: Intravascular Pressure Wire/FFR Study;  Surgeon: CBurnell Blanks MD;  [facility];  Service: Cardiovascular;  Laterality: N/A;   CARDIAC CATHETERIZATION N/A 01/27/2015   Procedure: Right Heart Cath;  Surgeon: CBurnell Blanks MD;  [facility];  Service: Cardiovascular;  Laterality: N/A;   CHOLECYSTECTOMY     COLONOSCOPY WITH PROPOFOL N/A 03/07/2017   Procedure: COLONOSCOPY WITH PROPOFOL;  Surgeon: MJuanita Craver MD;  [facility];  Service: Endoscopy;  Laterality: N/A;   CORONARY STENT PLACEMENT     LAD x 1   ESOPHAGOGASTRODUODENOSCOPY (EGD) WITH PROPOFOL N/A 12/01/2021   Procedure: ESOPHAGOGASTRODUODENOSCOPY (EGD) WITH PROPOFOL;  Surgeon: HCarol Ada MD;  [facility];  Service: Gastroenterology;  Laterality: N/A;   ganglion cyst removal  yrs ago   x 2   ILIAC VEIN ANGIOPLASTY / STENTING  02/15/2015   INTRAVASCULAR PRESSURE WIRE/FFR STUDY N/A 04/05/2017   Procedure: Intravascular Pressure Wire/FFR Study;  Surgeon: Martinique, Peter M, MD;  Location: [place] [facility];  Service: Cardiovascular;  Laterality: N/A;   IVC FILTER INSERTION  2016   IVC FILTER REMOVAL  02/15/2015   at [place] CATH AND CORONARY ANGIOGRAPHY N/A 04/05/2017   Procedure: Left Heart Cath and  Coronary Angiography;  Surgeon: Martinique, Peter M, MD;  Location: [place] [facility];  Service: Cardiovascular;  Laterality: N/A;   LEFT HEART CATHETERIZATION WITH CORONARY ANGIOGRAM N/A 09/25/2011   Procedure: LEFT HEART CATHETERIZATION WITH CORONARY ANGIOGRAM;  Surgeon: Sanda Klein, MD;  Location: [facility];  Service: Cardiovascular;  Laterality: N/A;   multiple bladder surgeries to remove mesh     ROTATOR CUFF REPAIR Right    stent to groin Left 08/2014   left leg   TENDON REPAIR Right 07/10/2017   Procedure: RIGHT PERONEAL TENDON REPAIR;  Surgeon: [name], DPM;  Location: [facility];  Service: Podiatry;  Laterality: Right;   TENDON REPAIR Right 05/27/2019   Procedure: PERONEAL TENDON REPAIR x2;  Surgeon: [name], DPM;  [facility];  Service: Podiatry;  Laterality: Right;   Social History:  reports that she has never smoked. She has never used smokeless tobacco. She reports that she does not drink alcohol and does not use drugs.  Allergies  Allergen Reactions   Atorvastatin Anaphylaxis and Other (See Comments)    Patient does not remember; caused pneumonia- took her off of it per patient   Penicillins Anaphylaxis, Hives, Swelling and Other (See Comments)    Tongue swelling Did it involve swelling of the face/tongue/throat, SOB, or low BP? Yes Did it involve sudden or severe rash/hives, skin peeling, or any reaction on the inside of your mouth or nose? Yes Did you need to seek medical attention at a hospital or doctor's office? Yes When did it last happen?  66 or 89 years old    If all above answers are "NO", may proceed with cephalosporin use.    Shellfish Allergy Anaphylaxis, Nausea And Vomiting and Other (See Comments)    Severe nausea and vomiting   Aminophylline Itching, Swelling, Rash and Other (See Comments)    Pt experienced burning urination, itching, redness/rash, and swelling of genitals after given this medication via IV push.   Iodinated Contrast Media  Swelling and Other (See Comments)    FLUSHING, also; LLE became swollen   Latex Other (See Comments)    Causes blisters   Oxybutynin Chloride Other (See Comments)    "blisters"   Propoxyphene Other (See Comments)    Unknown reaction   Vancomycin Hives and Other (See Comments)    06/02/2018- Hives arm, back and chest   Adhesive [Tape] Rash and Other (See Comments)    Paper tape is ok   Betadine [Povidone Iodine] Rash   Ciprofloxacin Hives   Codeine Nausea And Vomiting    .   Ranolazine Other (See Comments)    Constipation  .    Family History  Problem Relation Age of Onset   Cancer Mother    Heart disease Father    Heart disease Sister    High blood pressure Sister    Heart disease Sister    Cancer Sister    Heart disease Brother  Diabetes Brother    Brain cancer Brother    High blood pressure Brother    Heart disease Brother    Heart attack Brother    Heart disease Brother    High blood pressure Brother     Prior to Admission medications   Medication Sig Start Date End Date Taking? Authorizing Provider  amLODipine (NORVASC) 2.5 MG tablet TAKE ONE TABLET BY MOUTH EVERY MORNING 02/01/22   Croitoru, Mihai, MD  cholecalciferol (VITAMIN D3) 25 MCG (1000 UNIT) tablet Take 1,000 Units by mouth daily.     [provider]  ezetimibe (ZETIA) 10 MG tablet TAKE ONE TABLET BY MOUTH EVERY MORNING 02/01/22   Croitoru, Dani Gobble, MD  isosorbide mononitrate (IMDUR) 30 MG 24 hr tablet Take 1 tablet (30 mg total) by mouth 2 (two) times daily. 03/02/22 02/25/23  Loel Dubonnet, NP  ketoconazole (NIZORAL) 2 % shampoo Apply 1 application  topically daily as needed for irritation (or itching- SHAMPOO). 06/27/21   [provider]  KLOR-CON M20 20 MEQ tablet Take 1 tablet (20 mEq total) by mouth 2 (two) times daily. 03/29/22   Croitoru, Mihai, MD  latanoprost (XALATAN) 0.005 % ophthalmic solution Place 1 drop into both eyes at bedtime. 10/10/22   [provider]   levothyroxine (SYNTHROID) 88 MCG tablet TAKE ONE TABLET BY MOUTH BEFORE BREAKFAST 01/31/22   [name], [name], NP  pravastatin (PRAVACHOL) 80 MG tablet Take 80 mg by mouth at bedtime.    [provider]  sertraline (ZOLOFT) 25 MG tablet TAKE ONE TABLET BY MOUTH ONCE DAILY 10/31/22   [name], [name], NP  spironolactone (ALDACTONE) 25 MG tablet Take 1 tablet (25 mg total) by mouth daily. 03/29/22 03/24/23  Croitoru, Mihai, MD  torsemide (DEMADEX) 100 MG tablet 195 lbs OR less ONE-HALF tab IN THE MORNING & IN THE EVENING. 196lbs OR higher ONE tab IN THE MORNING & ONE-HALF tab IN THE EVENING 10/31/22   Croitoru, Mihai, MD  TYLENOL 8 HOUR ARTHRITIS PAIN 650 MG CR tablet Take 650 mg by mouth every 8 (eight) hours as needed for pain.    [provider]  vitamin C (ASCORBIC ACID) 500 MG tablet Take 500 mg by mouth daily.    [provider]    Physical Exam: Vitals:   11/12/22 1916 11/12/22 2038 11/12/22 2132 11/12/22 2250  BP: (!) 129/51 (!) 145/62 118/60 119/78  Pulse: 73 70 72 76  Resp: '16 16 17 20  '$ Temp:  98.6 F (37 C)    TempSrc:  Oral    SpO2: 96% 93% 93% 93%  Weight:      Height:       *** Data Reviewed: {Tip this will not be part of the note when signed- Document your independent interpretation of telemetry tracing, EKG, lab, Radiology test or any other diagnostic tests. Add any new diagnostic test ordered today. (Optional):26781} {Results:26384}  Assessment and Plan: No notes have been filed under this hospital service. Service: Hospitalist     Advance Care Planning:   Code Status: Prior ***  Consults: ***  Family Communication: ***  Severity of Illness: {Observation/Inpatient:21159}  Author: Lyndee Hensen, DO 11/12/2022 11:45 PM  For on call review [email or web address].

## 2022-11-12 NOTE — ED Notes (Signed)
CT notified that solumedrol has been given.  Benadryl is to be given 3 hrs after the solumedrol and the CT Abd/Pelvis will happen 4 hrs after the Solu-medrol

## 2022-11-13 ENCOUNTER — Other Ambulatory Visit: Payer: Self-pay

## 2022-11-13 ENCOUNTER — Encounter (HOSPITAL_COMMUNITY): Payer: Self-pay | Admitting: Family Medicine

## 2022-11-13 DIAGNOSIS — I130 Hypertensive heart and chronic kidney disease with heart failure and stage 1 through stage 4 chronic kidney disease, or unspecified chronic kidney disease: Secondary | ICD-10-CM | POA: Diagnosis present

## 2022-11-13 DIAGNOSIS — Z955 Presence of coronary angioplasty implant and graft: Secondary | ICD-10-CM | POA: Diagnosis not present

## 2022-11-13 DIAGNOSIS — I10 Essential (primary) hypertension: Secondary | ICD-10-CM

## 2022-11-13 DIAGNOSIS — Z23 Encounter for immunization: Secondary | ICD-10-CM | POA: Diagnosis present

## 2022-11-13 DIAGNOSIS — S060XAA Concussion with loss of consciousness status unknown, initial encounter: Secondary | ICD-10-CM | POA: Diagnosis present

## 2022-11-13 DIAGNOSIS — W109XXA Fall (on) (from) unspecified stairs and steps, initial encounter: Secondary | ICD-10-CM | POA: Diagnosis present

## 2022-11-13 DIAGNOSIS — I5032 Chronic diastolic (congestive) heart failure: Secondary | ICD-10-CM | POA: Diagnosis present

## 2022-11-13 DIAGNOSIS — D696 Thrombocytopenia, unspecified: Secondary | ICD-10-CM | POA: Diagnosis present

## 2022-11-13 DIAGNOSIS — I2510 Atherosclerotic heart disease of native coronary artery without angina pectoris: Secondary | ICD-10-CM | POA: Diagnosis present

## 2022-11-13 DIAGNOSIS — E039 Hypothyroidism, unspecified: Secondary | ICD-10-CM | POA: Diagnosis present

## 2022-11-13 DIAGNOSIS — S060X0A Concussion without loss of consciousness, initial encounter: Secondary | ICD-10-CM | POA: Diagnosis present

## 2022-11-13 DIAGNOSIS — E876 Hypokalemia: Secondary | ICD-10-CM | POA: Diagnosis present

## 2022-11-13 DIAGNOSIS — M1711 Unilateral primary osteoarthritis, right knee: Secondary | ICD-10-CM | POA: Diagnosis present

## 2022-11-13 DIAGNOSIS — R42 Dizziness and giddiness: Secondary | ICD-10-CM | POA: Diagnosis present

## 2022-11-13 DIAGNOSIS — I872 Venous insufficiency (chronic) (peripheral): Secondary | ICD-10-CM | POA: Diagnosis present

## 2022-11-13 DIAGNOSIS — M25361 Other instability, right knee: Secondary | ICD-10-CM | POA: Diagnosis present

## 2022-11-13 DIAGNOSIS — I739 Peripheral vascular disease, unspecified: Secondary | ICD-10-CM | POA: Diagnosis present

## 2022-11-13 DIAGNOSIS — Z85828 Personal history of other malignant neoplasm of skin: Secondary | ICD-10-CM | POA: Diagnosis not present

## 2022-11-13 DIAGNOSIS — S52512A Displaced fracture of left radial styloid process, initial encounter for closed fracture: Secondary | ICD-10-CM | POA: Diagnosis present

## 2022-11-13 DIAGNOSIS — S0101XA Laceration without foreign body of scalp, initial encounter: Secondary | ICD-10-CM | POA: Diagnosis present

## 2022-11-13 DIAGNOSIS — Y9301 Activity, walking, marching and hiking: Secondary | ICD-10-CM | POA: Diagnosis present

## 2022-11-13 DIAGNOSIS — S0990XA Unspecified injury of head, initial encounter: Secondary | ICD-10-CM

## 2022-11-13 DIAGNOSIS — R402412 Glasgow coma scale score 13-15, at arrival to emergency department: Secondary | ICD-10-CM | POA: Diagnosis present

## 2022-11-13 DIAGNOSIS — Z86718 Personal history of other venous thrombosis and embolism: Secondary | ICD-10-CM | POA: Diagnosis not present

## 2022-11-13 DIAGNOSIS — R519 Headache, unspecified: Secondary | ICD-10-CM | POA: Diagnosis present

## 2022-11-13 DIAGNOSIS — Z9181 History of falling: Secondary | ICD-10-CM | POA: Diagnosis not present

## 2022-11-13 DIAGNOSIS — Y92014 Private driveway to single-family (private) house as the place of occurrence of the external cause: Secondary | ICD-10-CM | POA: Diagnosis not present

## 2022-11-13 DIAGNOSIS — Z8673 Personal history of transient ischemic attack (TIA), and cerebral infarction without residual deficits: Secondary | ICD-10-CM | POA: Diagnosis not present

## 2022-11-13 DIAGNOSIS — Z66 Do not resuscitate: Secondary | ICD-10-CM | POA: Diagnosis present

## 2022-11-13 DIAGNOSIS — E785 Hyperlipidemia, unspecified: Secondary | ICD-10-CM | POA: Diagnosis present

## 2022-11-13 DIAGNOSIS — S42192A Fracture of other part of scapula, left shoulder, initial encounter for closed fracture: Secondary | ICD-10-CM | POA: Diagnosis present

## 2022-11-13 LAB — COMPREHENSIVE METABOLIC PANEL
ALT: 16 U/L (ref 0–44)
AST: 30 U/L (ref 15–41)
Albumin: 2.8 g/dL — ABNORMAL LOW (ref 3.5–5.0)
Alkaline Phosphatase: 71 U/L (ref 38–126)
Anion gap: 8 (ref 5–15)
BUN: 15 mg/dL (ref 8–23)
CO2: 29 mmol/L (ref 22–32)
Calcium: 9.4 mg/dL (ref 8.9–10.3)
Chloride: 102 mmol/L (ref 98–111)
Creatinine, Ser: 0.76 mg/dL (ref 0.44–1.00)
GFR, Estimated: >60 mL/min (ref >60)
Glucose, Bld: 136 mg/dL — ABNORMAL HIGH (ref 70–99)
Potassium: 3.3 mmol/L — ABNORMAL LOW (ref 3.5–5.1)
Sodium: 139 mmol/L (ref 135–145)
Total Bilirubin: 1.2 mg/dL (ref 0.3–1.2)
Total Protein: 5.7 g/dL — ABNORMAL LOW (ref 6.5–8.1)

## 2022-11-13 LAB — CBC
HCT: 39.5 % (ref 36.0–46.0)
Hemoglobin: 13 g/dL (ref 12.0–15.0)
MCH: 33.1 pg (ref 26.0–34.0)
MCHC: 32.9 g/dL (ref 30.0–36.0)
MCV: 100.5 fL — ABNORMAL HIGH (ref 80.0–100.0)
Platelets: 73 10*3/uL — ABNORMAL LOW (ref 150–400)
RBC: 3.93 MIL/uL (ref 3.87–5.11)
RDW: 13.7 % (ref 11.5–15.5)
WBC: 5.9 10*3/uL (ref 4.0–10.5)
nRBC: 0 % (ref 0.0–0.2)

## 2022-11-13 MED ORDER — POTASSIUM CHLORIDE CRYS ER 20 MEQ PO TBCR
40.0000 meq | EXTENDED_RELEASE_TABLET | Freq: Once | ORAL | Status: AC
  Administered 2022-11-13: 40 meq via ORAL
  Filled 2022-11-13: qty 2

## 2022-11-13 MED ORDER — HYDROCODONE-ACETAMINOPHEN 5-325 MG PO TABS
1.0000 | ORAL_TABLET | Freq: Four times a day (QID) | ORAL | Status: DC | PRN
  Administered 2022-11-13: 1 via ORAL
  Filled 2022-11-13 (×2): qty 1

## 2022-11-13 MED ORDER — LEVOTHYROXINE SODIUM 88 MCG PO TABS
88.0000 ug | ORAL_TABLET | Freq: Every day | ORAL | Status: DC
  Administered 2022-11-13 – 2022-11-17 (×5): 88 ug via ORAL
  Filled 2022-11-13 (×5): qty 1

## 2022-11-13 NOTE — Evaluation (Signed)
Occupational Therapy Evaluation Patient Details Name: Jill Preston MRN: [id number] DOB: 02-16-1933 Today's Date: 11/13/2022   History of Present Illness Pt is 89 yo female who presents on 11/12/22 after a fall down 2 steps. Pt with L radial styloid fx, L inferomedial scapular wing fx, L3 compression fx, large scalp hematoma. Of note she also fell at doctor's on 10/24/22 and hit her head. PMH: chronic diastolic HF, CAD s/p PCI, PAD, DVT (no anticoagulation), chronic thrombocytopenia, HTN, HLD, hypothyroidism, CVA, sick sinus syndrome, ACDF   Clinical Impression   PTA patient independent and driving. Admitted for above and presents with problem list below.  She is oriented and follows commands with increased time and requires cueing for problem solving, internally distracted by pain and dizziness.  Pt requires min assist +2 for bed mobility, transfers and up to max assist for ADLs. Dizziness with all movements, less but still present with turning head to L side.  L UE in sling with wrist brace on, educated on precautions. Based on performance today, believe she will best benefit from AIR level rehab to optimize independence, safety and return to PLOF.  Will follow acutely.     Recommendations for follow up therapy are one component of a multi-disciplinary discharge planning process, led by the attending physician.  Recommendations may be updated based on patient status, additional functional criteria and insurance authorization.   Follow Up Recommendations  Acute inpatient rehab (3hours/day)     Assistance Recommended at Discharge Frequent or constant Supervision/Assistance  Patient can return home with the following A lot of help with walking and/or transfers;A lot of help with bathing/dressing/bathroom;Assistance with cooking/housework;Direct supervision/assist for medications management;Direct supervision/assist for financial management;Assist for transportation;Help with stairs or ramp for  entrance    Functional Status Assessment  Patient has had a recent decline in their functional status and demonstrates the ability to make significant improvements in function in a reasonable and predictable amount of time.  Equipment Recommendations  Other (comment) (defer)    Recommendations for Other Services Rehab consult     Precautions / Restrictions Precautions Precautions: Fall;Back Precaution Booklet Issued: No Precaution Comments: dizziness with mobility Required Braces or Orthoses: Sling;Splint/Cast Splint/Cast: L wrist Restrictions Weight Bearing Restrictions: Yes LUE Weight Bearing:  (no orders, but followed NWB due to injuries to L UE)      Mobility Bed Mobility Overal bed mobility: Needs Assistance Bed Mobility: Rolling, Sidelying to Sit Rolling: Min assist Sidelying to sit: Min assist, +2 for physical assistance, +2 for safety/equipment, HOB elevated       General bed mobility comments: cueing for technique with min assist for rolling, using rails and min assist +2 to elevate trunk.    Transfers Overall transfer level: Needs assistance   Transfers: Sit to/from Stand Sit to Stand: Min assist, +2 safety/equipment, +2 physical assistance           General transfer comment: assist to power up with R hand held support, stepping towards R side into recliner with assist for steadying      Balance Overall balance assessment: Needs assistance Sitting-balance support: No upper extremity supported, Feet supported Sitting balance-Leahy Scale: Fair Sitting balance - Comments: min guard for safety due to dizziness   Standing balance support: During functional activity, Single extremity supported Standing balance-Leahy Scale: Poor Standing balance comment: relies on R UE and external support  ADL either performed or assessed with clinical judgement   ADL Overall ADL's : Needs assistance/impaired     Grooming: Minimal  assistance;Sitting           Upper Body Dressing : Maximal assistance;Sitting   Lower Body Dressing: Total assistance;+2 for physical assistance;+2 for safety/equipment;Sit to/from stand   Toilet Transfer: Minimal assistance;+2 for physical assistance;+2 for safety/equipment;Stand-pivot Toilet Transfer Details (indicate cue type and reason): simulated to recliner         Functional mobility during ADLs: Minimal assistance;+2 for physical assistance;+2 for safety/equipment;Cueing for safety General ADL Comments: pt limited by pain, dizziness, weakness     Vision   Vision Assessment?: No apparent visual deficits Additional Comments: pt with mild nystagmus with position changes, head turns more towards R, up and down. Began education on focused gaze     Perception     Praxis      Pertinent Vitals/Pain Pain Assessment Pain Assessment: No/denies pain     Hand Dominance Left   Extremity/Trunk Assessment Upper Extremity Assessment Upper Extremity Assessment: LUE deficits/detail LUE Deficits / Details: in sling with scapular fx and wrist brace with radial styloid fx. kept NWB. LUE: Shoulder pain at rest;Unable to fully assess due to pain;Unable to fully assess due to immobilization LUE Sensation: decreased light touch LUE Coordination: decreased fine motor;decreased gross motor   Lower Extremity Assessment Lower Extremity Assessment: Defer to PT evaluation   Cervical / Trunk Assessment Cervical / Trunk Assessment: Other exceptions Cervical / Trunk Exceptions: L3 compression fx   Communication Communication Communication: No difficulties   Cognition Arousal/Alertness: Awake/alert Behavior During Therapy: WFL for tasks assessed/performed Overall Cognitive Status: Impaired/Different from baseline Area of Impairment: Attention, Problem solving, Awareness                   Current Attention Level: Sustained       Awareness: Emergent Problem Solving: Slow  processing, Requires verbal cues, Decreased initiation General Comments: patient requires cueing to sustain attention to task with min cueing to redirect, slow processing and difficutly with initation.  internally distracted by pain and dizziness, but good awareness of deficits     General Comments  son and husband present and supportive    Exercises     Shoulder Instructions      Home Living Family/patient expects to be discharged to:: Private residence Living Arrangements: Spouse/significant other Available Help at Discharge: Family Type of Home: House Home Access: Stairs to enter;Ramped entrance Entrance Stairs-Number of Steps: 8 Entrance Stairs-Rails: Can reach both Home Layout: One level     Bathroom Shower/Tub: Tub/shower unit;Walk-in shower   Bathroom Toilet: Handicapped height     Home Equipment: Tub bench;Hand held Engineering geologist (2 wheels);Standard Walker;Rollator (4 wheels);Wheelchair - manual          Prior Functioning/Environment Prior Level of Function : Independent/Modified Independent             Mobility Comments: independent no AD ADLs Comments: driving, light IADLs        OT Problem List: Decreased strength;Decreased activity tolerance;Impaired balance (sitting and/or standing);Decreased safety awareness;Decreased knowledge of use of DME or AE;Decreased knowledge of precautions;Impaired UE functional use;Pain;Obesity      OT Treatment/Interventions: Self-care/ADL training;Therapeutic exercise;DME and/or AE instruction;Therapeutic activities;Balance training;Patient/family education    OT Goals(Current goals can be found in the care plan section) Acute Rehab OT Goals Patient Stated Goal: get better OT Goal Formulation: With patient Time For Goal Achievement: 11/27/22 Potential to Achieve Goals: [place]  OT  Frequency: Min 2X/week    Co-evaluation PT/OT/SLP Co-Evaluation/Treatment: Yes Reason for Co-Treatment: For patient/therapist  safety;To address functional/ADL transfers   OT goals addressed during session: ADL's and self-care      AM-PAC OT "6 Clicks" Daily Activity     Outcome Measure Help from another person eating meals?: A Little Help from another person taking care of personal grooming?: A Little Help from another person toileting, which includes using toliet, bedpan, or urinal?: A Lot Help from another person bathing (including washing, rinsing, drying)?: A Lot Help from another person to put on and taking off regular upper body clothing?: A Lot Help from another person to put on and taking off regular lower body clothing?: A Lot 6 Click Score: 14   End of Session Equipment Utilized During Treatment: Other (comment) (sling) Nurse Communication: Mobility status;Precautions  Activity Tolerance: Patient tolerated treatment well Patient left: in chair;with call bell/phone within reach;with chair alarm set;with family/visitor present  OT Visit Diagnosis: Other abnormalities of gait and mobility (R26.89);Muscle weakness (generalized) (M62.81);Pain;Dizziness and giddiness (R42);History of falling (Z91.81) Pain - Right/Left: Right Pain - part of body: Shoulder (ribs)                Time: [id number] OT Time Calculation (min): 36 min Charges:  OT General Charges $OT Visit: 1 Visit OT Evaluation $OT Eval Moderate Complexity: 1 Mod  Jolaine Artist, OT Acute Rehabilitation Services Office [phone]   Delight Stare 11/13/2022, 12:27 PM

## 2022-11-13 NOTE — Progress Notes (Signed)
0045 patient  came up from ER daughter in-law with patient. Patient alert x4 on room air sling to left arm and brace on left wrist CHG and all linen changed patient agreeable to have shirt cut off patient full of old dried blood staples noted to back of head with lots of dried blood.

## 2022-11-13 NOTE — Progress Notes (Signed)
PROGRESS NOTE    Jill Preston  [id number] DOB: [birth year] DOA: 11/12/2022 PCP: Sandrea Hughs, NP   Brief Narrative:  89 y.o. female with medical history significant of chronic diastolic heart failure, coronary artery disease status post PCI, peripheral artery disease, DVT (no anticoagulation), chronic thrombocytopenia, hyperlipidemia, hypertension, hypothyroidism, CVA, sick sinus syndrome presented with a fall with head injury and left shoulder injury.  On presentation, platelets were 79; imaging significant for:  left mildly displaced radial styloid fracture; right wrist was nontender though x-ray showed a possible scapholunate instability; left upper extremity and pelvis x-rays were unremarkable; CT head and neck significant for large posterior scalp hematoma without cranial or cervical spinal fracture and CT chest abdomen pelvis showing age-indeterminate mild L3 compression fracture, left inferiomedial scapular wing fracture and cirrhosis.  Patient was placed in a shoulder sling and given a wrist splint for her left upper extremity in the ED.  Head laceration was repaired with staples.  While trying to ambulate in the ED, she had extreme dizziness for which she was placed under observation.  Assessment & Plan:   Traumatic head injury with possible concussion following a mechanical fall Vertigo Scalp laceration: Repaired with staples in the ED -CT head and neck significant for large posterior scalp hematoma without cranial or cervical spinal fracture  -Patient had persistent vertigo with movement in the ED. -PT eval.  Fall precautions.  Left radial styloid fracture/left inferiomedial scapular wing fracture -Currently has left shoulder sling and left wrist splint.  Continue pain management.  Outpatient follow-up with orthopedic surgery.  Hypertension -Blood pressure still on the lower side.  Home antihypertensive on hold.  History of CAD status post PCI Hyperlipidemia -Resume  home medications including Zetia and pravastatin once med rec is complete.  Imdur on hold  Hypothyroidism -Continue Synthroid  Thrombocytopenia -Platelets 73 today.  Patient has chronic thrombocytopenia but baseline around 120.  Monitor.  Hypokalemia -Replace.  Repeat a.m. labs  DVT prophylaxis: SCDs Code Status: DNR Family Communication: None at bedside Disposition Plan: Status is: Observation The patient will require care spanning > 2 midnights and should be moved to inpatient because: Of severity of illness.  Need for PT evaluation.  Consultants: None  Procedures: None  Antimicrobials: None   Subjective: Patient seen and examined at bedside.  Complains of left upper extremity pain and some headache.  No fever or vomiting reported.  Has not gotten out of bed yet.  Objective: Vitals:   11/13/22 0100 11/13/22 0307 11/13/22 0343 11/13/22 0842  BP: 134/62 (!) 129/58  (!) 116/47  Pulse:  (!) 58  67  Resp: '18 18  18  '$ Temp: 98.4 F (36.9 C) 98.2 F (36.8 C)  98.3 F (36.8 C)  TempSrc: Oral Oral  Oral  SpO2: 92% 91% 91% 94%  Weight: 83.1 kg     Height:        Intake/Output Summary (Last 24 hours) at 11/13/2022 1013 Last data filed at 11/13/2022 0100 Gross per 24 hour  Intake --  Output 300 ml  Net -300 ml   Filed Weights   11/12/22 1545 11/13/22 0100  Weight: 78.9 kg 83.1 kg    Examination:  General exam: Appears calm and comfortable.  Elderly female lying in bed.  Scalp dressing present. Respiratory system: Bilateral decreased breath sounds at bases, scattered crackles Cardiovascular system: S1 & S2 heard, mild intermittent bradycardia present gastrointestinal system: Abdomen is nondistended, soft and nontender. Normal bowel sounds heard. Extremities: No cyanosis, clubbing, edema.  Left  upper extremity is in a sling with left wrist splint. Central nervous system: Alert and oriented.  Slow to respond.  Answers questions appropriately.  No focal neurological  deficits. Moving extremities Skin: No rashes, lesions or ulcers Psychiatry: Judgement and insight appear normal. Mood & affect appropriate.     Data Reviewed: I have personally reviewed following labs and imaging studies  CBC: Recent Labs  Lab 11/12/22 1915 11/13/22 0624  WBC 7.4 5.9  NEUTROABS 6.2  --   HGB 13.4 13.0  HCT 41.4 39.5  MCV 102.5* 100.5*  PLT 79* 73*   Basic Metabolic Panel: Recent Labs  Lab 11/12/22 1915 11/13/22 0624  NA 141 139  K 3.5 3.3*  CL 105 102  CO2 24 29  GLUCOSE 149* 136*  BUN 18 15  CREATININE 0.75 0.76  CALCIUM 8.7* 9.4   GFR: Estimated Creatinine Clearance: 53.3 mL/min (by C-G formula based on SCr of 0.76 mg/dL). Liver Function Tests: Recent Labs  Lab 11/12/22 1915 11/13/22 0624  AST 40 30  ALT 17 16  ALKPHOS 79 71  BILITOT 1.2 1.2  PROT 5.6* 5.7*  ALBUMIN 2.9* 2.8*   No results for input(s): "LIPASE", "AMYLASE" in the last 168 hours. No results for input(s): "AMMONIA" in the last 168 hours. Coagulation Profile: No results for input(s): "INR", "PROTIME" in the last 168 hours. Cardiac Enzymes: No results for input(s): "CKTOTAL", "CKMB", "CKMBINDEX", "TROPONINI" in the last 168 hours. BNP (last 3 results) No results for input(s): "PROBNP" in the last 8760 hours. HbA1C: No results for input(s): "HGBA1C" in the last 72 hours. CBG: Recent Labs  Lab 11/12/22 1547  GLUCAP 135*   Lipid Profile: No results for input(s): "CHOL", "HDL", "LDLCALC", "TRIG", "CHOLHDL", "LDLDIRECT" in the last 72 hours. Thyroid Function Tests: No results for input(s): "TSH", "T4TOTAL", "FREET4", "T3FREE", "THYROIDAB" in the last 72 hours. Anemia Panel: No results for input(s): "VITAMINB12", "FOLATE", "FERRITIN", "TIBC", "IRON", "RETICCTPCT" in the last 72 hours. Sepsis Labs: No results for input(s): "PROCALCITON", "LATICACIDVEN" in the last 168 hours.  No results found for this or any previous visit (from the past 240 hour(s)).        Radiology Studies: CT CHEST ABDOMEN PELVIS WO CONTRAST  Result Date: 11/12/2022 CLINICAL DATA:  Blunt trauma. EXAM: CT CHEST, ABDOMEN AND PELVIS WITHOUT CONTRAST TECHNIQUE: Multidetector CT imaging of the chest, abdomen and pelvis was performed following the standard protocol without IV contrast. RADIATION DOSE REDUCTION: This exam was performed according to the departmental dose-optimization program which includes automated exposure control, adjustment of the mA and/or kV according to patient size and/or use of iterative reconstruction technique. COMPARISON:  CT abdomen pelvis dated 10/22/2019. FINDINGS: Evaluation of this exam is limited in the absence of intravenous contrast. CT CHEST FINDINGS Cardiovascular: There is no cardiomegaly or pericardial effusion. There is 3 vessel coronary vascular calcification. Moderate atherosclerotic calcification of the thoracic aorta. No aneurysmal dilatation. Mild dilatation of the main pulmonary trunk suggestive of pulmonary hypertension. Clinical correlation is recommended. Mediastinum/Nodes: No hilar or mediastinal adenopathy. The esophagus is grossly unremarkable. No mediastinal fluid collection. Lungs/Pleura: There are bibasilar linear atelectasis/scarring. No focal consolidation, pleural effusion, or pneumothorax. The central airways are patent. Musculoskeletal: Fracture of the inferior medial left scapular wing. Osteopenia with degenerative changes of the spine. Mild L3 compression fracture, age indeterminate. Correlation with clinical exam and point tenderness recommended. No retropulsion. CT ABDOMEN PELVIS FINDINGS No intra-abdominal free air.  Small perihepatic ascites. Hepatobiliary: Cirrhosis. No biliary ductal dilatation. Cholecystectomy. Pancreas: Unremarkable. No pancreatic ductal  dilatation or surrounding inflammatory changes. Spleen: Mild splenomegaly measuring 14 cm in length. Adrenals/Urinary Tract: The adrenal glands are unremarkable. There is  a 3 mm nonobstructing left renal interpolar calculus. No hydronephrosis. The right kidney is unremarkable. The visualized ureters and urinary bladder appear unremarkable. Stomach/Bowel: There is no bowel obstruction or active inflammation. The appendix is normal. Vascular/Lymphatic: Moderate aortoiliac atherosclerotic disease. Left common iliac vein stent noted. Evaluation of the vessels is limited in the absence of intravenous contrast. No portal venous gas. There is no adenopathy. Reproductive: Hysterectomy.  No adnexal masses. Other: None Musculoskeletal: Osteopenia with degenerative changes of the spine. L4-L5 disc spacer and posterior fusion. No acute osseous pathology. IMPRESSION: 1. Fracture of the inferio-medial left scapular wing. 2. Mild L3 compression fracture, age indeterminate. Correlation with clinical exam and point tenderness recommended. No retropulsion. 3. Cirrhosis with small perihepatic ascites and mild splenomegaly. 4. A 3 mm nonobstructing left renal interpolar calculus. No hydronephrosis. 5.  Aortic Atherosclerosis (ICD10-I70.0). Electronically Signed   By: Anner Crete M.D.   On: 11/12/2022 21:42   CT Head Wo Contrast  Result Date: 11/12/2022 CLINICAL DATA:  Head and neck trauma. Fall down steps landing on left side. Hematoma to back of head. EXAM: CT HEAD WITHOUT CONTRAST CT CERVICAL SPINE WITHOUT CONTRAST TECHNIQUE: Multidetector CT imaging of the head and cervical spine was performed following the standard protocol without intravenous contrast. Multiplanar CT image reconstructions of the cervical spine were also generated. RADIATION DOSE REDUCTION: This exam was performed according to the departmental dose-optimization program which includes automated exposure control, adjustment of the mA and/or kV according to patient size and/or use of iterative reconstruction technique. COMPARISON:  CT head and cervical spine 06/20/2021. FINDINGS: CT HEAD FINDINGS Brain: No acute hemorrhage.  Unchanged chronic small-vessel disease. Cortical gray-white differentiation is otherwise preserved. Prominence of the ventricles and sulci within normal limits for age. No extra-axial collection. Basilar cisterns are patent. Vascular: No hyperdense vessel or unexpected calcification. Skull: No calvarial fracture or suspicious bone lesion. Skull base is unremarkable. Sinuses/Orbits: Unremarkable. Other: Large posterior scalp hematoma. CT CERVICAL SPINE FINDINGS Alignment: Normal. Skull base and vertebrae: Postoperative changes of C5-C7 ACDF. Intact hardware. No hardware associated lucency or fracture. Normal craniocervical junction. No acute cervical spine fracture. Soft tissues and spinal canal: No prevertebral fluid or swelling. No visible canal hematoma. Disc levels:  No high-grade spinal canal stenosis. Upper chest: Unremarkable. Other: Atherosclerotic calcifications of the carotid bulbs. IMPRESSION: 1. No acute intracranial abnormality. Large posterior scalp hematoma without underlying calvarial fracture. 2. No acute cervical spine fracture or traumatic listhesis. 3. Postoperative changes of C5-C7 ACDF. Intact hardware. Electronically Signed   By: Emmit Alexanders M.D.   On: 11/12/2022 17:05   CT Cervical Spine Wo Contrast  Result Date: 11/12/2022 CLINICAL DATA:  Head and neck trauma. Fall down steps landing on left side. Hematoma to back of head. EXAM: CT HEAD WITHOUT CONTRAST CT CERVICAL SPINE WITHOUT CONTRAST TECHNIQUE: Multidetector CT imaging of the head and cervical spine was performed following the standard protocol without intravenous contrast. Multiplanar CT image reconstructions of the cervical spine were also generated. RADIATION DOSE REDUCTION: This exam was performed according to the departmental dose-optimization program which includes automated exposure control, adjustment of the mA and/or kV according to patient size and/or use of iterative reconstruction technique. COMPARISON:  CT head and  cervical spine 06/20/2021. FINDINGS: CT HEAD FINDINGS Brain: No acute hemorrhage. Unchanged chronic small-vessel disease. Cortical gray-white differentiation is otherwise preserved. Prominence of the ventricles and sulci  within normal limits for age. No extra-axial collection. Basilar cisterns are patent. Vascular: No hyperdense vessel or unexpected calcification. Skull: No calvarial fracture or suspicious bone lesion. Skull base is unremarkable. Sinuses/Orbits: Unremarkable. Other: Large posterior scalp hematoma. CT CERVICAL SPINE FINDINGS Alignment: Normal. Skull base and vertebrae: Postoperative changes of C5-C7 ACDF. Intact hardware. No hardware associated lucency or fracture. Normal craniocervical junction. No acute cervical spine fracture. Soft tissues and spinal canal: No prevertebral fluid or swelling. No visible canal hematoma. Disc levels:  No high-grade spinal canal stenosis. Upper chest: Unremarkable. Other: Atherosclerotic calcifications of the carotid bulbs. IMPRESSION: 1. No acute intracranial abnormality. Large posterior scalp hematoma without underlying calvarial fracture. 2. No acute cervical spine fracture or traumatic listhesis. 3. Postoperative changes of C5-C7 ACDF. Intact hardware. Electronically Signed   By: Emmit Alexanders M.D.   On: 11/12/2022 17:05   DG Shoulder Left  Result Date: 11/12/2022 CLINICAL DATA:  Left shoulder pain after fall. EXAM: LEFT SHOULDER - 2+ VIEW COMPARISON:  None Available. FINDINGS: There is no evidence of fracture or dislocation. There is no evidence of arthropathy or other focal bone abnormality. Soft tissues are unremarkable. IMPRESSION: Negative. Electronically Signed   By: Marijo Conception M.D.   On: 11/12/2022 16:44   DG Pelvis 1-2 Views  Result Date: 11/12/2022 CLINICAL DATA:  Chronic sacral pain. EXAM: PELVIS - 1-2 VIEW COMPARISON:  None Available. FINDINGS: There is no evidence of pelvic fracture or diastasis. No pelvic bone lesions are seen.  IMPRESSION: Negative. Electronically Signed   By: Marijo Conception M.D.   On: 11/12/2022 16:43   DG Humerus Left  Result Date: 11/12/2022 CLINICAL DATA:  Left arm pain after fall. EXAM: LEFT HUMERUS - 2+ VIEW COMPARISON:  None Available. FINDINGS: There is no evidence of fracture or other focal bone lesions. Soft tissues are unremarkable. IMPRESSION: Negative. Electronically Signed   By: Marijo Conception M.D.   On: 11/12/2022 16:42   DG Chest 1 View  Result Date: 11/12/2022 CLINICAL DATA:  Trauma.  Left shoulder pain. EXAM: CHEST  1 VIEW COMPARISON:  Chest radiographs 03/08/2022 and 11/16/2021 FINDINGS: Cardiac silhouette is again moderately enlarged. Moderate calcification is again seen within the aortic arch. Mild bilateral chronic interstitial thickening is unchanged. Additional unchanged left costophrenic angle linear chronic scarring. No acute airspace opacity. No pleural effusion pneumothorax. ACDF hardware again overlies the lower cervical spine. IMPRESSION: 1. No acute lung process. 2. Stable cardiomegaly and chronic interstitial thickening. Electronically Signed   By: Yvonne Kendall M.D.   On: 11/12/2022 16:42   DG Wrist Complete Left  Result Date: 11/12/2022 CLINICAL DATA:  Status post fall 3 weeks ago. Pain. Bruising and swelling. EXAM: LEFT WRIST - COMPLETE 3+ VIEW COMPARISON:  Left hand radiographs 02/17/2014 FINDINGS: There is diffuse decreased bone mineralization. There is oblique linear lucency indicating an acute fracture of the radial styloid approximately 6 mm proximal from the distal tip. No significant displacement on frontal view, however there may be up to 3 mm diastasis at the fracture line seen on oblique view. There is also a 4 x 2 mm chronic calcific density just distal to the radial styloid. Moderate to severe thumb carpometacarpal and moderate triscaphe joint space narrowing with thumb carpometacarpal subchondral sclerosis and mild peripheral osteophytosis. IMPRESSION: 1. Acute  mildly displaced fracture of the radial styloid. 2. 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 Electronically Signed   By: Yvonne Kendall M.D.   On: 11/12/2022 10:17   DG Wrist  Complete Right  Result Date: 11/12/2022 CLINICAL DATA:  Status post fall 3 weeks ago.  Pain. EXAM: RIGHT WRIST - COMPLETE 3+ VIEW COMPARISON:  Right hand radiographs 06/26/2021 FINDINGS: There is diffuse decreased bone mineralization. Severe thumb carpometacarpal joint space narrowing, subchondral sclerosis, and peripheral osteophytosis is similar to prior. Severe triscaphe joint space narrowing with unchanged bone-on-bone contact. There is minimal relative widening of the proximal aspect of the scaphoid interval measuring up to 3 mm compared to the lunotriquetral ligament interval measuring 1.5 mm. There is dorsal angulation of the lunate with the scapholunate angle measuring 91 degrees. No acute fracture is seen. No dislocation. IMPRESSION: 1. No acute fracture is seen. 2. Dorsal angulation of the lunate with the scapholunate angle measuring 91 degrees. This can be seen with dorsal intercalated segment instability. 3. 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 Electronically Signed   By: Yvonne Kendall M.D.   On: 11/12/2022 10:13        Scheduled Meds:  levothyroxine  88 mcg Oral Q0600   Continuous Infusions:        Aline August, MD [facility] 11/13/2022, 10:13 AM '

## 2022-11-13 NOTE — Progress Notes (Signed)
  Transition of Care [facility]) Screening Note   Patient Details  Name: KAMAYAH BANIA Date of Birth: 18-Jan-1933   Transition of Care [facility]) CM/SW Contact:    Pollie Friar, RN Phone Number: 11/13/2022, 2:56 PM   Pt is from home. CIR to evaluate for rehab. Transition of Care Department [facility]) has reviewed patient. We will continue to monitor patient advancement through interdisciplinary progression rounds. If new patient transition needs arise, please place a TOC consult.

## 2022-11-13 NOTE — Evaluation (Signed)
Physical Therapy Evaluation Patient Details Name: Jill Preston MRN: [id number] DOB: 31-May-1933 Today's Date: 11/13/2022  History of Present Illness  Pt is 89 yo female who presents on 11/12/22 after a fall down 2 steps. Pt with L radial styloid fx, L inferomedial scapular wing fx, L3 compression fx, large scalp hematoma. Of note she also fell at doctor's on 10/24/22 and hit her head. PMH: chronic diastolic HF, CAD s/p PCI, PAD, DVT (no anticoagulation), chronic thrombocytopenia, HTN, HLD, hypothyroidism, CVA, sick sinus syndrome, ACDF  Clinical Impression  Pt admitted with above diagnosis. Pt from home with husband, was independent and driving until these 2 recent falls. Pt mobility today limited by dizziness with all head turns, esp to R. Needed min A +2 for bed mobility and to step to recliner from bed. Pt has end stage OA L knee which may have caused her fall on steps. In addition she has had multiple ligament surgeries R ankle. Recommend AIR level therapies to return to independence.  Pt currently with functional limitations due to the deficits listed below (see PT Problem List). Pt will benefit from skilled PT to increase their independence and safety with mobility to allow discharge to the venue listed below.          Recommendations for follow up therapy are one component of a multi-disciplinary discharge planning process, led by the attending physician.  Recommendations may be updated based on patient status, additional functional criteria and insurance authorization.  Follow Up Recommendations Acute inpatient rehab (3hours/day)      Assistance Recommended at Discharge Frequent or constant Supervision/Assistance  Patient can return home with the following  Two people to help with walking and/or transfers;Two people to help with bathing/dressing/bathroom;Assistance with cooking/housework;Assistance with feeding;Assist for transportation;Help with stairs or ramp for entrance     Equipment Recommendations Other (comment) (TBD)  Recommendations for Other Services  Rehab consult    Functional Status Assessment Patient has had a recent decline in their functional status and demonstrates the ability to make significant improvements in function in a reasonable and predictable amount of time.     Precautions / Restrictions Precautions Precautions: Fall;Back Precaution Booklet Issued: No Precaution Comments: dizziness with mobility Required Braces or Orthoses: Sling;Splint/Cast Splint/Cast: L wrist Restrictions Weight Bearing Restrictions: Yes LUE Weight Bearing:  (no orders, but followed NWB due to injuries to L UE)      Mobility  Bed Mobility Overal bed mobility: Needs Assistance Bed Mobility: Rolling, Sidelying to Sit Rolling: Min assist Sidelying to sit: Min assist, +2 for physical assistance, +2 for safety/equipment, HOB elevated       General bed mobility comments: cueing for technique with min assist for rolling, using rails and min assist +2 to elevate trunk.    Transfers Overall transfer level: Needs assistance Equipment used: Rolling walker (2 wheels) Transfers: Sit to/from Stand, Bed to chair/wheelchair/BSC Sit to Stand: Min assist, +2 safety/equipment, +2 physical assistance   Step pivot transfers: Mod assist, +2 physical assistance       General transfer comment: assist to power up with R hand held support, stepping towards R side into recliner with assist for steadying    Ambulation/Gait               General Gait Details: pt with dizziness with mobility, did not progress ambulation  Stairs            Wheelchair Mobility    Modified Rankin (Stroke Patients Only)       Balance Overall  balance assessment: Needs assistance Sitting-balance support: No upper extremity supported, Feet supported Sitting balance-Leahy Scale: Fair Sitting balance - Comments: min guard for safety due to dizziness which increased with head  turn to R>L and up and down   Standing balance support: During functional activity, Single extremity supported Standing balance-Leahy Scale: Poor Standing balance comment: relies on R UE and external support                             Pertinent Vitals/Pain Pain Assessment Pain Assessment: Faces Faces Pain Scale: Hurts even more Pain Location: L shoulder and ribs Pain Descriptors / Indicators: Sore Pain Intervention(s): Limited activity within patient's tolerance, Monitored during session    Home Living Family/patient expects to be discharged to:: Private residence Living Arrangements: Spouse/significant other Available Help at Discharge: Family Type of Home: House Home Access: Stairs to enter;Ramped entrance Entrance Stairs-Rails: Can reach both Entrance Stairs-Number of Steps: 8   Home Layout: One level Home Equipment: Tub bench;Hand held shower head;Rolling Walker (2 wheels);Standard Walker;Rollator (4 wheels);Wheelchair - manual Additional Comments: pt lives with her elderly husband. Son lives down the street, works in the day, presemt on eval    Prior Function Prior Level of Function : Independent/Modified Independent             Mobility Comments: independent no AD ADLs Comments: driving, light IADLs     Hand Dominance   Dominant Hand: Left    Extremity/Trunk Assessment   Upper Extremity Assessment Upper Extremity Assessment: Defer to OT evaluation LUE Deficits / Details: in sling with scapular fx and wrist brace with radial styloid fx. kept NWB. LUE: Shoulder pain at rest;Unable to fully assess due to pain;Unable to fully assess due to immobilization LUE Sensation: decreased light touch LUE Coordination: decreased fine motor;decreased gross motor    Lower Extremity Assessment Lower Extremity Assessment: RLE deficits/detail;LLE deficits/detail;Generalized weakness RLE Deficits / Details: has h/o multiple ligament surgeries R ankle, hip flex and  knee flex/ ext >3/5 RLE Sensation: WNL RLE Coordination: WNL LLE Deficits / Details: end state L knee OA, knee ext 3-/5 with small extension lag. Mild buckling in standing LLE Sensation: WNL LLE Coordination: decreased gross motor    Cervical / Trunk Assessment Cervical / Trunk Assessment: Other exceptions Cervical / Trunk Exceptions: L3 compression fx  Communication   Communication: No difficulties  Cognition Arousal/Alertness: Awake/alert Behavior During Therapy: WFL for tasks assessed/performed Overall Cognitive Status: Impaired/Different from baseline Area of Impairment: Attention, Problem solving, Awareness                   Current Attention Level: Sustained       Awareness: Emergent Problem Solving: Slow processing, Requires verbal cues, Decreased initiation General Comments: patient requires cueing to sustain attention to task with min cueing to redirect, slow processing and difficutly with initation.  internally distracted by pain and dizziness, but good awareness of deficits        General Comments General comments (skin integrity, edema, etc.): son and husband present. Dizziness subsided with supported sitting and no head mvmt    Exercises     Assessment/Plan    PT Assessment Patient needs continued PT services  PT Problem List Decreased strength;Decreased range of motion;Decreased activity tolerance;Decreased balance;Decreased mobility;Decreased coordination;Decreased cognition;Decreased knowledge of use of DME;Decreased safety awareness;Decreased knowledge of precautions;Pain;Cardiopulmonary status limiting activity       PT Treatment Interventions DME instruction;Gait training;Functional mobility training;Therapeutic activities;Therapeutic exercise;Balance training;Neuromuscular re-education;Cognitive  remediation;Patient/family education    PT Goals (Current goals can be found in the Care Plan section)  Acute Rehab PT Goals Patient Stated Goal:  return home PT Goal Formulation: With patient/family Time For Goal Achievement: 11/27/22 Potential to Achieve Goals: Good    Frequency Min 4X/week     Co-evaluation PT/OT/SLP Co-Evaluation/Treatment: Yes Reason for Co-Treatment: For patient/therapist safety;To address functional/ADL transfers PT goals addressed during session: Balance;Mobility/safety with mobility;Strengthening/ROM OT goals addressed during session: ADL's and self-care       AM-PAC PT "6 Clicks" Mobility  Outcome Measure Help needed turning from your back to your side while in a flat bed without using bedrails?: A Lot Help needed moving from lying on your back to sitting on the side of a flat bed without using bedrails?: Total Help needed moving to and from a bed to a chair (including a wheelchair)?: Total Help needed standing up from a chair using your arms (e.g., wheelchair or bedside chair)?: Total Help needed to walk in hospital room?: Total Help needed climbing 3-5 steps with a railing? : Total 6 Click Score: 7    End of Session   Activity Tolerance: Treatment limited secondary to medical complications (Comment) (dizziness) Patient left: in chair;with call bell/phone within reach;with chair alarm set;with family/visitor present Nurse Communication: Mobility status PT Visit Diagnosis: Muscle weakness (generalized) (M62.81);Dizziness and giddiness (R42);Pain;Difficulty in walking, not elsewhere classified (R26.2) Pain - Right/Left: Left Pain - part of body: Shoulder;Arm    Time: [id number] PT Time Calculation (min) (ACUTE ONLY): 37 min   Charges:   PT Evaluation $PT Eval Moderate Complexity: [place] chat preferred Office [facility] 11/13/2022, 1:28 PM

## 2022-11-13 NOTE — Plan of Care (Signed)
  Problem: Education: Goal: Knowledge of General Education information will improve Description: Including pain rating scale, medication(s)/side effects and non-pharmacologic comfort measures Outcome: Not Progressing   Problem: Health Behavior/Discharge Planning: Goal: Ability to manage health-related needs will improve Outcome: Not Progressing   Problem: Clinical Measurements: Goal: Ability to maintain clinical measurements within normal limits will improve Outcome: Not Progressing Goal: Will remain free from infection Outcome: Not Progressing Goal: Diagnostic test results will improve Outcome: Not Progressing Goal: Respiratory complications will improve Outcome: Not Progressing Goal: Cardiovascular complication will be avoided Outcome: Not Progressing   Problem: Activity: Goal: Risk for activity intolerance will decrease Outcome: Not Progressing   Problem: Nutrition: Goal: Adequate nutrition will be maintained Outcome: Not Progressing   Problem: Coping: Goal: Level of anxiety will decrease Outcome: Not Progressing   Problem: Elimination: Goal: Will not experience complications related to bowel motility Outcome: Not Progressing Goal: Will not experience complications related to urinary retention Outcome: Not Progressing   Problem: Pain Managment: Goal: General experience of comfort will improve Outcome: Not Progressing   Problem: Safety: Goal: Ability to remain free from injury will improve Outcome: Not Progressing   Problem: Skin Integrity: Goal: Risk for impaired skin integrity will decrease Outcome: Not Progressing Patient admitted from a fall multiple fractures requiring total care

## 2022-11-13 NOTE — Progress Notes (Signed)
Orthopedic Tech Progress Note Patient Details:  Jill Preston 12-20-1932 [id number]  Ortho Devices Type of Ortho Device: Sling immobilizer Ortho Device/Splint Location: lue Ortho Device/Splint Interventions: Ordered, Application, Adjustment   Post Interventions Patient Tolerated: Well Instructions Provided: Care of device, Adjustment of device  Karolee Stamps 11/13/2022, 12:57 AM

## 2022-11-13 NOTE — ED Notes (Signed)
ED TO INPATIENT HANDOFF REPORT  ED Nurse Name and Phone #: Mechele Claude, [phone]  S Name/Age/Gender Jill Preston 89 y.o. female Room/Bed: H011C/H011C  Code Status   Code Status: Prior  Home/SNF/Other Home Patient oriented to: self, place, time, and situation Is this baseline? Yes   Triage Complete: Triage complete  Chief Complaint Head injury due to trauma [S09.90XA]  Triage Note Pt BIB [facility] from home c/o fall, pt tripped and fell down two steps When outside. Resulting in landing on her left side. Possible deformity noted to left shoulder. Pt did hit head. Hematoma to back of head. No LOC, Not on blood thinner,  A&O X 4. #20 RAC. 144mg Fentanyl give BY ems.  Last VS 147/79, hr 92, cbg 136. O2 96%.    Allergies Allergies  Allergen Reactions   Atorvastatin Anaphylaxis and Other (See Comments)    Patient does not remember; caused pneumonia- took her off of it per patient   Penicillins Anaphylaxis, Hives, Swelling and Other (See Comments)    Tongue swelling Did it involve swelling of the face/tongue/throat, SOB, or low BP? Yes Did it involve sudden or severe rash/hives, skin peeling, or any reaction on the inside of your mouth or nose? Yes Did you need to seek medical attention at a hospital or doctor's office? Yes When did it last happen?  [age]years old    If all above answers are "NO", may proceed with cephalosporin use.    Shellfish Allergy Anaphylaxis, Nausea And Vomiting and Other (See Comments)    Severe nausea and vomiting   Aminophylline Itching, Swelling, Rash and Other (See Comments)    Pt experienced burning urination, itching, redness/rash, and swelling of genitals after given this medication via IV push.   Iodinated Contrast Media Swelling and Other (See Comments)    FLUSHING, also; LLE became swollen   Latex Other (See Comments)    Causes blisters   Oxybutynin Chloride Other (See Comments)    "blisters"   Propoxyphene Other (See Comments)     Unknown reaction   Vancomycin Hives and Other (See Comments)    06/02/2018- Hives arm, back and chest   Adhesive [Tape] Rash and Other (See Comments)    Paper tape is ok   Betadine [Povidone Iodine] Rash   Ciprofloxacin Hives   Codeine Nausea And Vomiting    .   Ranolazine Other (See Comments)    Constipation  .    Level of Care/Admitting Diagnosis ED Disposition     ED Disposition  Admit   Condition  --   [id number]  Level of Care: Med-Surg [16]  [facility]:: Yes  Covid Evaluation: Asymptomatic - no recent exposure (last 10 days) testing not required  Diagnosis: Head injury due to trauma [[id number] Admitting Physician: BLyndee Hensen[[id number] Attending Physician: BLyndee Hensen[[id number]         B Medical/Surgery History Past Medical History:  Diagnosis Date   Arthritis    right knee   [place]   skin cancer removed from face   Chronic diastolic CHF (congestive heart failure) [place]    Chronic venous insufficiency    LEA VENOUS, 10/17/2011 - mild reflux in bilateral common femoral veins   CKD (chronic kidney disease), stage III ([place]    Coronary artery disease    a. s/p PCI/BMS  to prox LAD and balloon angioplasty to mLAD with suboptimal result in 2000. b. Abnl nuc 2012, cath 09/2011 - showed that the overall territory of potential ischemia was small and attributable to a moderately diseased small second diagonal artery. Med rx.   Dyspnea    with activity   Headache    History of blood transfusion 2017   Hyperlipidemia    Hypertension    Hypertensive heart disease    Hypothyroidism    Obesity    Pneumonia    several times   Stroke [facility])    pt. states she had "light stroke" in sept. 1980   Urinary incontinence    Past Surgical History:  Procedure Laterality Date   ABDOMINAL HYSTERECTOMY  1970's    complete   ANKLE ARTHROSCOPY Right 07/10/2017   Procedure: ANKLE ARTHROSCOPY;  Surgeon: [name], DPM;  Location: [place];  Service: Podiatry;  Laterality: Right;   BACK SURGERY  07/26/2016   cervical neck surgery, [facility]   BALLOON DILATION N/A 12/01/2021   Procedure: BALLOON DILATION;  Surgeon: Carol Ada, MD;  [facility];  Service: Gastroenterology;  Laterality: N/A;   BLADDER SURGERY  2010   WITH MESH  bladder tach   bunion removal surgery Bilateral 15 yrs ago   CARDIAC CATHETERIZATION Left 09/25/2011   Medical management   CARDIAC CATHETERIZATION Left 03/25/2001   Normal LV function, LAD residual narrowing of less than 10%, normal ramus intermediate, circumflex, and RCA,    CARDIAC CATHETERIZATION  09/04/1999   LAD, 3x16m Tetra stent resulting in a reduction of the 80% stenosis to 0% residual   CARDIAC CATHETERIZATION N/A 01/26/2015   Procedure: Right/Left Heart Cath and Coronary Angiography;  Surgeon: TTroy Sine MD;  [place];  Service: Cardiovascular;  Laterality: N/A;   CARDIAC CATHETERIZATION N/A 01/27/2015   Procedure: Intravascular Pressure Wire/FFR Study;  Surgeon: CBurnell Blanks MD;  [place] [facility];  Service: Cardiovascular;  Laterality: N/A;   CARDIAC CATHETERIZATION N/A 01/27/2015   Procedure: Right Heart Cath;  Surgeon: CBurnell Blanks MD;  [place] [facility];  Service: Cardiovascular;  Laterality: N/A;   CHOLECYSTECTOMY     COLONOSCOPY WITH PROPOFOL N/A 03/07/2017   Procedure: COLONOSCOPY WITH PROPOFOL;  Surgeon: MJuanita Craver MD;  [facility];  Service: Endoscopy;  Laterality: N/A;   CORONARY STENT PLACEMENT     LAD x 1   ESOPHAGOGASTRODUODENOSCOPY (EGD) WITH PROPOFOL N/A 12/01/2021   Procedure: ESOPHAGOGASTRODUODENOSCOPY (EGD) WITH PROPOFOL;  Surgeon: HCarol Ada MD;  [facility];  Service: Gastroenterology;  Laterality: N/A;   ganglion cyst removal   yrs ago   x 2   ILIAC VEIN ANGIOPLASTY / STENTING  02/15/2015   INTRAVASCULAR PRESSURE WIRE/FFR STUDY N/A 04/05/2017   Procedure: Intravascular Pressure Wire/FFR Study;  Surgeon: JMartinique Peter M, MD;  [place];  Service: Cardiovascular;  Laterality: N/A;   IVC FILTER INSERTION  2016   IVC FILTER REMOVAL  02/15/2015   [place] N/A 04/05/2017   Procedure: Left Heart Cath and Coronary Angiography;  Surgeon: JMartinique Peter M, MD;  [place];  Service: Cardiovascular;  Laterality: N/A;   LEFT HEART CATHETERIZATION WITH CORONARY ANGIOGRAM N/A 09/25/2011   Procedure: LEFT HEART CATHETERIZATION WITH CORONARY ANGIOGRAM;  Surgeon: MSanda Klein MD;  [place];  Service: Cardiovascular;  Laterality: N/A;   multiple bladder surgeries to remove mesh  ROTATOR CUFF REPAIR Right    stent to groin Left 08/2014   left leg   TENDON REPAIR Right 07/10/2017   Procedure: RIGHT PERONEAL TENDON REPAIR;  Surgeon: [name], DPM;  Location: [place];  Service: Podiatry;  Laterality: Right;   TENDON REPAIR Right 05/27/2019   Procedure: PERONEAL TENDON REPAIR x2;  Surgeon: [name], DPM;  [facility];  Service: Podiatry;  Laterality: Right;     A IV Location/Drains/Wounds Patient Lines/Drains/Airways Status     Active Line/Drains/Airways     Name Placement date Placement time Site Days   Peripheral IV 11/12/22 20 G Right Antecubital 11/12/22  1633  Antecubital  1   Incision (Closed) 07/10/17 Ankle Right 07/10/17  1543  -- 1952   Incision (Closed) 06/02/18 Back Other (Comment) 06/02/18  1653  -- 1625   Incision (Closed) 05/27/19 Ankle Right 05/27/19  1104  -- 1266            Intake/Output Last 24 hours No intake or output data in the 24 hours ending 11/13/22 0001  Labs/Imaging Results for orders placed or performed during the hospital encounter of 11/12/22 (from the past 48 hour(s))  CBG  monitoring, ED     Status: Abnormal   Collection Time: 11/12/22  3:47 PM  Result Value Ref Range   Glucose-Capillary 135 (H) 70 - 99 mg/dL    Comment: Glucose reference range applies only to samples taken after fasting for at least 8 hours.  CBC with Differential     Status: Abnormal   Collection Time: 11/12/22  7:15 PM  Result Value Ref Range   WBC 7.4 4.0 - 10.5 K/uL   RBC 4.04 3.87 - 5.11 MIL/uL   Hemoglobin 13.4 12.0 - 15.0 g/dL   HCT 41.4 36.0 - 46.0 %   MCV 102.5 (H) 80.0 - 100.0 fL   MCH 33.2 26.0 - 34.0 pg   MCHC 32.4 30.0 - 36.0 g/dL   RDW 13.8 11.5 - 15.5 %   Platelets 79 (L) 150 - 400 K/uL    Comment: Immature Platelet Fraction may be clinically indicated, consider ordering this additional test [id number] REPEATED TO VERIFY PLATELET COUNT CONFIRMED BY SMEAR    nRBC 0.0 0.0 - 0.2 %   Neutrophils Relative % 83 %   Neutro Abs 6.2 1.7 - 7.7 K/uL   Lymphocytes Relative 9 %   Lymphs Abs 0.7 0.7 - 4.0 K/uL   Monocytes Relative 6 %   Monocytes Absolute 0.5 0.1 - 1.0 K/uL   Eosinophils Relative 1 %   Eosinophils Absolute 0.0 0.0 - 0.5 K/uL   Basophils Relative 0 %   Basophils Absolute 0.0 0.0 - 0.1 K/uL   Immature Granulocytes 1 %   Abs Immature Granulocytes 0.05 0.00 - 0.07 K/uL    Comment: Performed at [facility], [address] [place], [place] [address]  Comprehensive metabolic panel     Status: Abnormal   Collection Time: 11/12/22  7:15 PM  Result Value Ref Range   Sodium 141 135 - 145 mmol/L   Potassium 3.5 3.5 - 5.1 mmol/L    Comment: HEMOLYSIS AT THIS LEVEL MAY AFFECT RESULT   Chloride 105 98 - 111 mmol/L   CO2 24 22 - 32 mmol/L   Glucose, Bld 149 (H) 70 - 99 mg/dL    Comment: Glucose reference range applies only to samples taken after fasting for at least 8 hours.   BUN 18 8 - 23 mg/dL  Creatinine, Ser 0.75 0.44 - 1.00 mg/dL   Calcium 8.7 (L) 8.9 - 10.3 mg/dL   Total Protein 5.6 (L) 6.5 - 8.1 g/dL   Albumin 2.9 (L) 3.5 - 5.0 g/dL   AST 40 15 -  41 U/L    Comment: HEMOLYSIS AT THIS LEVEL MAY AFFECT RESULT   ALT 17 0 - 44 U/L    Comment: HEMOLYSIS AT THIS LEVEL MAY AFFECT RESULT   Alkaline Phosphatase 79 38 - 126 U/L   Total Bilirubin 1.2 0.3 - 1.2 mg/dL    Comment: HEMOLYSIS AT THIS LEVEL MAY AFFECT RESULT   GFR, Estimated >60 >60 mL/min    Comment: (NOTE) Calculated using the CKD-EPI Creatinine Equation (2021)    Anion gap 12 5 - 15    Comment: Performed at [facility], [place] [address], [place], [place] [address]   CT CHEST ABDOMEN PELVIS WO CONTRAST  Result Date: 11/12/2022 CLINICAL DATA:  Blunt trauma. EXAM: CT CHEST, ABDOMEN AND PELVIS WITHOUT CONTRAST TECHNIQUE: Multidetector CT imaging of the chest, abdomen and pelvis was performed following the standard protocol without IV contrast. RADIATION DOSE REDUCTION: This exam was performed according to the departmental dose-optimization program which includes automated exposure control, adjustment of the mA and/or kV according to patient size and/or use of iterative reconstruction technique. COMPARISON:  CT abdomen pelvis dated 10/22/2019. FINDINGS: Evaluation of this exam is limited in the absence of intravenous contrast. CT CHEST FINDINGS Cardiovascular: There is no cardiomegaly or pericardial effusion. There is 3 vessel coronary vascular calcification. Moderate atherosclerotic calcification of the thoracic aorta. No aneurysmal dilatation. Mild dilatation of the main pulmonary trunk suggestive of pulmonary hypertension. Clinical correlation is recommended. Mediastinum/Nodes: No hilar or mediastinal adenopathy. The esophagus is grossly unremarkable. No mediastinal fluid collection. Lungs/Pleura: There are bibasilar linear atelectasis/scarring. No focal consolidation, pleural effusion, or pneumothorax. The central airways are patent. Musculoskeletal: Fracture of the inferior medial left scapular wing. Osteopenia with degenerative changes of the spine. Mild L3 compression fracture, age  indeterminate. Correlation with clinical exam and point tenderness recommended. No retropulsion. CT ABDOMEN PELVIS FINDINGS No intra-abdominal free air.  Small perihepatic ascites. Hepatobiliary: Cirrhosis. No biliary ductal dilatation. Cholecystectomy. Pancreas: Unremarkable. No pancreatic ductal dilatation or surrounding inflammatory changes. Spleen: Mild splenomegaly measuring 14 cm in length. Adrenals/Urinary Tract: The adrenal glands are unremarkable. There is a 3 mm nonobstructing left renal interpolar calculus. No hydronephrosis. The right kidney is unremarkable. The visualized ureters and urinary bladder appear unremarkable. Stomach/Bowel: There is no bowel obstruction or active inflammation. The appendix is normal. Vascular/Lymphatic: Moderate aortoiliac atherosclerotic disease. Left common iliac vein stent noted. Evaluation of the vessels is limited in the absence of intravenous contrast. No portal venous gas. There is no adenopathy. Reproductive: Hysterectomy.  No adnexal masses. Other: None Musculoskeletal: Osteopenia with degenerative changes of the spine. L4-L5 disc spacer and posterior fusion. No acute osseous pathology. IMPRESSION: 1. Fracture of the inferio-medial left scapular wing. 2. Mild L3 compression fracture, age indeterminate. Correlation with clinical exam and point tenderness recommended. No retropulsion. 3. Cirrhosis with small perihepatic ascites and mild splenomegaly. 4. A 3 mm nonobstructing left renal interpolar calculus. No hydronephrosis. 5.  Aortic Atherosclerosis (ICD10-I70.0). Electronically Signed   By: Anner Crete M.D.   On: 11/12/2022 21:42   CT Head Wo Contrast  Result Date: 11/12/2022 CLINICAL DATA:  Head and neck trauma. Fall down steps landing on left side. Hematoma to back of head. EXAM: CT HEAD WITHOUT CONTRAST CT CERVICAL SPINE WITHOUT CONTRAST TECHNIQUE: Multidetector  CT imaging of the head and cervical spine was performed following the standard protocol  without intravenous contrast. Multiplanar CT image reconstructions of the cervical spine were also generated. RADIATION DOSE REDUCTION: This exam was performed according to the departmental dose-optimization program which includes automated exposure control, adjustment of the mA and/or kV according to patient size and/or use of iterative reconstruction technique. COMPARISON:  CT head and cervical spine 06/20/2021. FINDINGS: CT HEAD FINDINGS Brain: No acute hemorrhage. Unchanged chronic small-vessel disease. Cortical gray-white differentiation is otherwise preserved. Prominence of the ventricles and sulci within normal limits for age. No extra-axial collection. Basilar cisterns are patent. Vascular: No hyperdense vessel or unexpected calcification. Skull: No calvarial fracture or suspicious bone lesion. Skull base is unremarkable. Sinuses/Orbits: Unremarkable. Other: Large posterior scalp hematoma. CT CERVICAL SPINE FINDINGS Alignment: Normal. Skull base and vertebrae: Postoperative changes of C5-C7 ACDF. Intact hardware. No hardware associated lucency or fracture. Normal craniocervical junction. No acute cervical spine fracture. Soft tissues and spinal canal: No prevertebral fluid or swelling. No visible canal hematoma. Disc levels:  No high-grade spinal canal stenosis. Upper chest: Unremarkable. Other: Atherosclerotic calcifications of the carotid bulbs. IMPRESSION: 1. No acute intracranial abnormality. Large posterior scalp hematoma without underlying calvarial fracture. 2. No acute cervical spine fracture or traumatic listhesis. 3. Postoperative changes of C5-C7 ACDF. Intact hardware. Electronically Signed   By: Emmit Alexanders M.D.   On: 11/12/2022 17:05   CT Cervical Spine Wo Contrast  Result Date: 11/12/2022 CLINICAL DATA:  Head and neck trauma. Fall down steps landing on left side. Hematoma to back of head. EXAM: CT HEAD WITHOUT CONTRAST CT CERVICAL SPINE WITHOUT CONTRAST TECHNIQUE: Multidetector CT  imaging of the head and cervical spine was performed following the standard protocol without intravenous contrast. Multiplanar CT image reconstructions of the cervical spine were also generated. RADIATION DOSE REDUCTION: This exam was performed according to the departmental dose-optimization program which includes automated exposure control, adjustment of the mA and/or kV according to patient size and/or use of iterative reconstruction technique. COMPARISON:  CT head and cervical spine 06/20/2021. FINDINGS: CT HEAD FINDINGS Brain: No acute hemorrhage. Unchanged chronic small-vessel disease. Cortical gray-white differentiation is otherwise preserved. Prominence of the ventricles and sulci within normal limits for age. No extra-axial collection. Basilar cisterns are patent. Vascular: No hyperdense vessel or unexpected calcification. Skull: No calvarial fracture or suspicious bone lesion. Skull base is unremarkable. Sinuses/Orbits: Unremarkable. Other: Large posterior scalp hematoma. CT CERVICAL SPINE FINDINGS Alignment: Normal. Skull base and vertebrae: Postoperative changes of C5-C7 ACDF. Intact hardware. No hardware associated lucency or fracture. Normal craniocervical junction. No acute cervical spine fracture. Soft tissues and spinal canal: No prevertebral fluid or swelling. No visible canal hematoma. Disc levels:  No high-grade spinal canal stenosis. Upper chest: Unremarkable. Other: Atherosclerotic calcifications of the carotid bulbs. IMPRESSION: 1. No acute intracranial abnormality. Large posterior scalp hematoma without underlying calvarial fracture. 2. No acute cervical spine fracture or traumatic listhesis. 3. Postoperative changes of C5-C7 ACDF. Intact hardware. Electronically Signed   By: Emmit Alexanders M.D.   On: 11/12/2022 17:05   DG Shoulder Left  Result Date: 11/12/2022 CLINICAL DATA:  Left shoulder pain after fall. EXAM: LEFT SHOULDER - 2+ VIEW COMPARISON:  None Available. FINDINGS: There is no  evidence of fracture or dislocation. There is no evidence of arthropathy or other focal bone abnormality. Soft tissues are unremarkable. IMPRESSION: Negative. Electronically Signed   By: Marijo Conception M.D.   On: 11/12/2022 16:44   DG Pelvis 1-2 Views  Result Date: 11/12/2022 CLINICAL DATA:  Chronic sacral pain. EXAM: PELVIS - 1-2 VIEW COMPARISON:  None Available. FINDINGS: There is no evidence of pelvic fracture or diastasis. No pelvic bone lesions are seen. IMPRESSION: Negative. Electronically Signed   By: Marijo Conception M.D.   On: 11/12/2022 16:43   DG Humerus Left  Result Date: 11/12/2022 CLINICAL DATA:  Left arm pain after fall. EXAM: LEFT HUMERUS - 2+ VIEW COMPARISON:  None Available. FINDINGS: There is no evidence of fracture or other focal bone lesions. Soft tissues are unremarkable. IMPRESSION: Negative. Electronically Signed   By: Marijo Conception M.D.   On: 11/12/2022 16:42   DG Chest 1 View  Result Date: 11/12/2022 CLINICAL DATA:  Trauma.  Left shoulder pain. EXAM: CHEST  1 VIEW COMPARISON:  Chest radiographs 03/08/2022 and 11/16/2021 FINDINGS: Cardiac silhouette is again moderately enlarged. Moderate calcification is again seen within the aortic arch. Mild bilateral chronic interstitial thickening is unchanged. Additional unchanged left costophrenic angle linear chronic scarring. No acute airspace opacity. No pleural effusion pneumothorax. ACDF hardware again overlies the lower cervical spine. IMPRESSION: 1. No acute lung process. 2. Stable cardiomegaly and chronic interstitial thickening. Electronically Signed   By: Yvonne Kendall M.D.   On: 11/12/2022 16:42   DG Wrist Complete Left  Result Date: 11/12/2022 CLINICAL DATA:  Status post fall 3 weeks ago. Pain. Bruising and swelling. EXAM: LEFT WRIST - COMPLETE 3+ VIEW COMPARISON:  Left hand radiographs 02/17/2014 FINDINGS: There is diffuse decreased bone mineralization. There is oblique linear lucency indicating an acute fracture of the  radial styloid approximately 6 mm proximal from the distal tip. No significant displacement on frontal view, however there may be up to 3 mm diastasis at the fracture line seen on oblique view. There is also a 4 x 2 mm chronic calcific density just distal to the radial styloid. Moderate to severe thumb carpometacarpal and moderate triscaphe joint space narrowing with thumb carpometacarpal subchondral sclerosis and mild peripheral osteophytosis. IMPRESSION: 1. Acute mildly displaced fracture of the radial styloid. 2. 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 Electronically Signed   By: Yvonne Kendall M.D.   On: 11/12/2022 10:17   DG Wrist Complete Right  Result Date: 11/12/2022 CLINICAL DATA:  Status post fall 3 weeks ago.  Pain. EXAM: RIGHT WRIST - COMPLETE 3+ VIEW COMPARISON:  Right hand radiographs 06/26/2021 FINDINGS: There is diffuse decreased bone mineralization. Severe thumb carpometacarpal joint space narrowing, subchondral sclerosis, and peripheral osteophytosis is similar to prior. Severe triscaphe joint space narrowing with unchanged bone-on-bone contact. There is minimal relative widening of the proximal aspect of the scaphoid interval measuring up to 3 mm compared to the lunotriquetral ligament interval measuring 1.5 mm. There is dorsal angulation of the lunate with the scapholunate angle measuring 91 degrees. No acute fracture is seen. No dislocation. IMPRESSION: 1. No acute fracture is seen. 2. Dorsal angulation of the lunate with the scapholunate angle measuring 91 degrees. This can be seen with dorsal intercalated segment instability. 3. 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 Electronically Signed   By: Yvonne Kendall M.D.   On: 11/12/2022 10:13    Pending Labs Unresulted Labs (From admission, onward)    None       Vitals/Pain Today's Vitals   11/12/22 2038 11/12/22 2132 11/12/22 2222 11/12/22 2250   BP: (!) 145/62 118/60  119/78  Pulse: 70 72  76  Resp: '16 17  20  '$ Temp: 98.6 F (37 C)  TempSrc: Oral     SpO2: 93% 93%  93%  Weight:      Height:      PainSc:   2      Isolation Precautions No active isolations  Medications Medications  methylPREDNISolone sodium succinate (SOLU-MEDROL) 40 mg/mL injection 40 mg (40 mg Intravenous Given 11/12/22 1849)  fentaNYL (SUBLIMAZE) injection 50 mcg (50 mcg Intravenous Given 11/12/22 1851)  HYDROmorphone (DILAUDID) injection 0.5 mg (0.5 mg Intravenous Given 11/12/22 2130)  ondansetron (ZOFRAN) injection 4 mg (4 mg Intravenous Given 11/12/22 2208)  lactated ringers bolus 500 mL (500 mLs Intravenous New Bag/Given 11/12/22 2305)  Tdap (BOOSTRIX) injection 0.5 mL (0.5 mLs Intramuscular Given 11/12/22 2338)    Mobility walks with person assist     Focused Assessments Neuro Assessment Handoff:  Swallow screen pass? Yes          Neuro Assessment:   Neuro Checks:      Has TPA been given? No If patient is a Neuro Trauma and patient is going to OR before floor call report to [place] nurse: [phone] or [phone]   R Recommendations: See Admitting Provider Note  Report given to:   Additional Notes: pt is AAOx4. Pt is on room air.

## 2022-11-14 DIAGNOSIS — D696 Thrombocytopenia, unspecified: Secondary | ICD-10-CM | POA: Diagnosis not present

## 2022-11-14 DIAGNOSIS — S060X0A Concussion without loss of consciousness, initial encounter: Principal | ICD-10-CM

## 2022-11-14 MED ORDER — ACETAMINOPHEN 325 MG PO TABS
650.0000 mg | ORAL_TABLET | Freq: Four times a day (QID) | ORAL | Status: DC | PRN
  Administered 2022-11-14 – 2022-11-16 (×6): 650 mg via ORAL
  Filled 2022-11-14 (×6): qty 2

## 2022-11-14 NOTE — Progress Notes (Signed)
  Inpatient Rehabilitation Admissions Coordinator   Met with patient at bedside for rehab assessment. We discussed goals and expectations of a possible CIR admit. 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 Family is providing care for spouse  while she is hospitalized. I discussed that it is unlikely that [employer] would approve CIR level rehab for her diagnosis.  She prefers [facility],as they took great care of her spouse in January. I have alerted SW of SNF need. We will not pursue Cir admit at this time and will sign off. Please call me with any questions.   Danne Baxter, RN, MSN Rehab Admissions Coordinator [phone]

## 2022-11-14 NOTE — TOC Initial Note (Signed)
Transition of Care [facility]) - Initial/Assessment Note    Patient Details  Name: Jill Preston MRN: [id number] Date of Birth: 07-30-1933  Transition of Care [facility]) CM/SW Contact:    Pollie Friar, RN Phone Number: 11/14/2022, 12:13 PM  Clinical Narrative:                 Pt is from home with spouse who has Alzheimers dementia. She takes care of him at home. Spouse uses walker for ambulation. Currently the son and pts BIL are assisting the pts spouse at home.  Pt manages her own medications and was providing the needed transportation for the couple. Current recommendations are for CIR. Awaiting CIR eval. Pt states she has a LTC policy through [employer] and is going to have her son bring it so they can see what she is eligible for. She thinks it doesn't kick in for 90 days but may be willing to arrange caregivers as private pay until then.  TOC following.  Expected Discharge Plan: IP Rehab Facility Barriers to Discharge: Continued Medical Work up   Patient Goals and CMS Choice   CMS Medicare.gov Compare Post Acute Care list provided to:: Patient Choice offered to / list presented to : Patient      Expected Discharge Plan and Services   Discharge Planning Services: CM Consult Post Acute Care Choice: IP Rehab Living arrangements for the past 2 months: Single Family Home                                      Prior Living Arrangements/Services Living arrangements for the past 2 months: Single Family Home Lives with:: Spouse Patient language and need for interpreter reviewed:: Yes Do you feel safe going back to the place where you live?: Yes        Care giver support system in place?: Yes (comment)   Criminal Activity/Legal Involvement Pertinent to Current Situation/Hospitalization: No - Comment as needed  Activities of Daily Living Home Assistive Devices/Equipment: None ADL Screening (condition at time of admission) Patient's cognitive ability adequate to  safely complete daily activities?: Yes Is the patient deaf or have difficulty hearing?: No Does the patient have difficulty seeing, even when wearing glasses/contacts?: No Does the patient have difficulty concentrating, remembering, or making decisions?: No Patient able to express need for assistance with ADLs?: Yes Does the patient have difficulty dressing or bathing?: Yes Independently performs ADLs?: No Communication: Independent Dressing (OT): Dependent Is this a change from baseline?: Change from baseline, expected to last <3days Grooming: Dependent Is this a change from baseline?: Change from baseline, expected to last >3 days Feeding: Needs assistance Is this a change from baseline?: Change from baseline, expected to last >3 days Bathing: Dependent Is this a change from baseline?: Change from baseline, expected to last >3 days Toileting: Dependent Is this a change from baseline?: Change from baseline, expected to last >3days In/Out Bed: Dependent Is this a change from baseline?: Change from baseline, expected to last >3 days Walks in Home: Independent Does the patient have difficulty walking or climbing stairs?: Yes Weakness of Legs: Both Weakness of Arms/Hands: Left  Permission Sought/Granted                  Emotional Assessment Appearance:: Appears stated age Attitude/Demeanor/Rapport: Engaged Affect (typically observed): Accepting Orientation: : Oriented to Self, Oriented to Place, Oriented to  Time, Oriented to Situation  Psych Involvement: No (comment)  Admission diagnosis:  Closed nondisplaced fracture of styloid process of left radius, initial encounter [S52.515A] Laceration of scalp, initial encounter [S01.01XA] Head injury due to trauma [S09.90XA] Closed fracture of left scapula, unspecified part of scapula, initial encounter [S42.102A] Concussion [S06.0XAA] Patient Active Problem List   Diagnosis Date Noted   Concussion 11/13/2022   Head injury due  to trauma 11/12/2022   Fall 10/25/2022   Hypokalemia 03/08/2022   Sick sinus syndrome ([place]) 03/07/2022   Hordeolum externum (stye) 05/16/2021   Chronic otitis media with serous effusion 05/16/2021   Thrombocytopenia ([place]) 04/17/2021   PAD (peripheral artery disease) ([place]) 08/01/2018   Herniated intervertebral disc of lumbar spine 06/02/2018   Bilateral leg pain 01/27/2017   Cervical spine degeneration 10/23/2015   Hypersomnolence 10/23/2015   S/P IVC filter 02/15/2015   Angina pectoris ([place]) 01/26/2015   CAD in native artery    Ischemic chest pain (HCC)    Edema of left lower extremity 09/16/2014   Acute deep vein thrombosis of left lower extremity ([place]) 09/14/2014   Anemia associated with acute blood loss 09/14/2014   Subclavian artery stenosis, left (HCC) 08/04/2014   Chronic diastolic CHF (congestive heart failure) ([place]) 07/16/2014   Essential hypertension 07/08/2014   CKD (chronic kidney disease), stage III (HCC)    Hypertensive heart disease    Chronic venous insufficiency    Metatarsal deformity 06/24/2013   CAD - LAD stent 12/00. Patent '02 and 1/13, moderate D2 managed medically 02/26/2013   Dyslipidemia 02/26/2013   DJD (degenerative joint disease)- back 02/26/2013   Asthma 08/06/2012   Hyperlipidemia [id number]   Lichen sclerosus [id number]   Rectocele 06/03/2012   Recurrent UTI 06/03/2012   Urge urinary incontinence 06/03/2012   Voiding dysfunction 06/03/2012   Hypothyroidism 07/23/2011   AKI (acute kidney injury) ([place]) 07/23/2011   PCP:  Sandrea Hughs, NP Pharmacy:   [facility], [address] [place] [address] Phone: [phone] Fax: [phone]  [facility] [place], [place] [address] [address] Phone: [phone] Fax: [phone]  [facility] - [place], [place] - [address] [place] [address] Phone: [phone] Fax: [place], [address] [place] [address] Phone: [phone] Fax: [place], [place] [address] [place] [address] Phone: [phone] Fax: [phone]     Social Determinants of Health (SDOH) Social History: [place]: Food Insecurity Present (11/13/2022)  Housing: Low Risk  (11/13/2022)  Transportation Needs: No Transportation Needs (11/13/2022)  Utilities: Not At Risk (11/13/2022)  Depression (PHQ2-9): Low Risk  (09/04/2022)  Tobacco Use: Low Risk  (11/13/2022)   SDOH Interventions: Housing Interventions: Intervention Not Indicated   Readmission Risk Interventions     No data to display

## 2022-11-14 NOTE — Progress Notes (Signed)
Physical Therapy Treatment Patient Details Name: Jill Preston MRN: [id number] DOB: 1933/03/12 Today's Date: 11/14/2022   History of Present Illness Pt is 89 yo female who presents on 11/12/22 after a fall down 2 steps. Pt with L radial styloid fx, L inferomedial scapular wing fx, L3 compression fx, large scalp hematoma. Of note she also fell at doctor's on 10/24/22 and hit her head. PMH: chronic diastolic HF, CAD s/p PCI, PAD, DVT (no anticoagulation), chronic thrombocytopenia, HTN, HLD, hypothyroidism, CVA, sick sinus syndrome, ACDF    PT Comments    Pt continues to report dizziness, that is described as feeling like she is "falling" with mobility, particularly when rotating her head to the L or rolling to the L. This indicates questionable horizontal canal BPPV involvement vs concussion related dizziness. Will continue to monitor and assess as pt's dizziness seemed to be worse looking to the R the other session. Pt was able to progress to ambulating up to ~20 ft in the room at a min-modA level when using the L platform RW. Pt needing increased assistance when trying to take steps with only R HHA due to L knee pain. Finished session with L UE AAROM to reduce muscular tightness and encourage use of her L UE. She had extreme pain with shoulder ROM. Will continue to follow acutely. Chart review reveals pt may not be accepted at [facility], thus if [facility] is not approved then would recommend short-term rehab at a SNF.     Recommendations for follow up therapy are one component of a multi-disciplinary discharge planning process, led by the attending physician.  Recommendations may be updated based on patient status, additional functional criteria and insurance authorization.  Follow Up Recommendations  Acute inpatient rehab (3hours/day) (if not approved for [facility], then SNF)     Assistance Recommended at Discharge Frequent or constant Supervision/Assistance  Patient can return home with the following  Assistance with cooking/housework;Assistance with feeding;Assist for transportation;Help with stairs or ramp for entrance;A lot of help with walking and/or transfers;A lot of help with bathing/dressing/bathroom;Direct supervision/assist for medications management;Direct supervision/assist for financial management   Equipment Recommendations  Other (comment) (TBD)    Recommendations for Other Services       Precautions / Restrictions Precautions Precautions: Fall;Back Precaution Booklet Issued: No Precaution Comments: dizziness with mobility Required Braces or Orthoses: Sling;Splint/Cast (Per Dr. Starla Link and Silvestre Gunner, PA Ortho 11/14/22 - no brace for lumbar fx) Splint/Cast: L wrist Restrictions Weight Bearing Restrictions: Yes LUE Weight Bearing: Weight bear through elbow only Other Position/Activity Restrictions: Per Silvestre Gunner, PA Ortho 11/14/22 -NWB through hand, WBAT through elbow, no ROM restrictions     Mobility  Bed Mobility Overal bed mobility: Needs Assistance Bed Mobility: Rolling, Sidelying to Sit Rolling: Min assist Sidelying to sit: Min assist, HOB elevated       General bed mobility comments: cueing for technique with minA to rotate trunk and ascend trunk to sit up R EOB    Transfers Overall transfer level: Needs assistance Equipment used: Left platform walker, 1 person hand held assist Transfers: Sit to/from Stand, Bed to chair/wheelchair/BSC Sit to Stand: Mod assist, Min assist   Step pivot transfers: Mod assist       General transfer comment: Pt needing modA with pt pulling up on therapist with her R UE to stand from EOB and step to L to commode. MinA to stand from commode with L platform walker.    Ambulation/Gait Ambulation/Gait assistance: Min assist, Mod assist Gait Distance (Feet): 20 Feet (  x2 bouts of ~3 ft > ~20 ft) Assistive device: Left platform walker Gait Pattern/deviations: Step-through pattern, Decreased stride length, Decreased  stance time - left, Decreased weight shift to left, Antalgic Gait velocity: reduced Gait velocity interpretation: <1.31 ft/sec, indicative of household ambulator   General Gait Details: Pt with L knee pain impacting weight bearing tolerance and resulting in antalgic gait pattern. ModA to step a few times from bed to commode with HHA. Min-modA for stability when ambulating in the room using a L platform walker, demonstrating improved stability and activity tolerance when compared to R HHA only.   Stairs             Wheelchair Mobility    Modified Rankin (Stroke Patients Only)       Balance Overall balance assessment: Needs assistance Sitting-balance support: No upper extremity supported, Feet supported Sitting balance-Leahy Scale: Fair Sitting balance - Comments: min guard to supervision for safety due to dizziness   Standing balance support: During functional activity, Single extremity supported, Bilateral upper extremity supported Standing balance-Leahy Scale: Poor Standing balance comment: relies on UE support and external support                            Cognition Arousal/Alertness: Awake/alert Behavior During Therapy: WFL for tasks assessed/performed Overall Cognitive Status: Impaired/Different from baseline Area of Impairment: Attention, Problem solving, Awareness, Memory                   Current Attention Level: Sustained Memory: Decreased short-term memory, Decreased recall of precautions     Awareness: Emergent Problem Solving: Slow processing, Requires verbal cues, Decreased initiation General Comments: patient requires cueing to sustain attention to task with min cueing to redirect, slow processing and difficutly with initation.  internally distracted by pain and dizziness, but good awareness of deficits. Needs reminders of her precautions        Exercises General Exercises - Upper Extremity Shoulder Flexion: AAROM, Left, Other reps  (comment), Seated (x3 , <45 degrees) Shoulder ABduction: AAROM, Left, Other reps (comment), Seated (x3 reps, < 45 degrees) Elbow Flexion: AROM, Left, 15 reps, Seated Elbow Extension: AROM, Left, 15 reps, Seated Digit Composite Flexion: AROM, Left, 10 reps, Seated Composite Extension: AROM, Left, 10 reps, Seated    General Comments General comments (skin integrity, edema, etc.): noted pt felt like she was "falling" when rotating her head to L and when rolling L compared to R in bed, but did not note any nystagmus, questionable horizontal canal BPPV involvement vs concussion dizziness?      Pertinent Vitals/Pain Pain Assessment Pain Assessment: 0-10 Pain Score: 10-Worst pain ever Pain Location: L shoulder and ribs, L knee Pain Descriptors / Indicators: Sore, Grimacing, Moaning, Guarding, Discomfort Pain Intervention(s): Monitored during session, Limited activity within patient's tolerance, Repositioned    Home Living                          Prior Function            PT Goals (current goals can now be found in the care plan section) Acute Rehab PT Goals Patient Stated Goal: to improve PT Goal Formulation: With patient Time For Goal Achievement: 11/27/22 Potential to Achieve Goals: Good Progress towards PT goals: Progressing toward goals    Frequency    Min 4X/week      PT Plan Current plan remains appropriate    Co-evaluation  AM-PAC PT "6 Clicks" Mobility   Outcome Measure  Help needed turning from your back to your side while in a flat bed without using bedrails?: A Little Help needed moving from lying on your back to sitting on the side of a flat bed without using bedrails?: A Little Help needed moving to and from a bed to a chair (including a wheelchair)?: A Lot Help needed standing up from a chair using your arms (e.g., wheelchair or bedside chair)?: A Lot Help needed to walk in hospital room?: A Lot Help needed climbing 3-5 steps  with a railing? : Total 6 Click Score: 13    End of Session Equipment Utilized During Treatment: Gait belt Activity Tolerance: Patient tolerated treatment well Patient left: in chair;with call bell/phone within reach;with chair alarm set   PT Visit Diagnosis: Muscle weakness (generalized) (M62.81);Dizziness and giddiness (R42);Pain;Difficulty in walking, not elsewhere classified (R26.2);Unsteadiness on feet (R26.81);Other abnormalities of gait and mobility (R26.89) Pain - Right/Left: Left Pain - part of body: Shoulder;Arm     Time: [id number] PT Time Calculation (min) (ACUTE ONLY): 47 min  Charges:  $Gait Training: 8-22 mins $Therapeutic Exercise: 8-22 mins $Therapeutic Activity: 8-22 mins                     Moishe Spice, PT, DPT Acute Rehabilitation Services  Office: [place] 11/14/2022, 5:18 PM

## 2022-11-14 NOTE — Progress Notes (Signed)
PROGRESS NOTE    Jill Preston  [id number] DOB: [birth year] DOA: 11/12/2022 PCP: Sandrea Hughs, NP   Brief Narrative:  89 y.o. female with medical history significant of chronic diastolic heart failure, coronary artery disease status post PCI, peripheral artery disease, DVT (no anticoagulation), chronic thrombocytopenia, hyperlipidemia, hypertension, hypothyroidism, CVA, sick sinus syndrome presented with a fall with head injury and left shoulder injury.  On presentation, platelets were 79; imaging significant for:  left mildly displaced radial styloid fracture; right wrist was nontender though x-ray showed a possible scapholunate instability; left upper extremity and pelvis x-rays were unremarkable; CT head and neck significant for large posterior scalp hematoma without cranial or cervical spinal fracture and CT chest abdomen pelvis showing age-indeterminate mild L3 compression fracture, left inferiomedial scapular wing fracture and cirrhosis.  Patient was placed in a shoulder sling and given a wrist splint for her left upper extremity in the ED.  Head laceration was repaired with staples.  While trying to ambulate in the ED, she had extreme dizziness for which she was placed under observation.  PT subsequently recommended CIR placement.  Assessment & Plan:   Traumatic head injury with possible concussion following a mechanical fall Vertigo Scalp laceration: Repaired with staples in the ED -CT head and neck significant for large posterior scalp hematoma without cranial or cervical spinal fracture  -Patient had persistent vertigo with movement in the ED. -PT recommending CIR placement.  CIR consulted. -CIR fall precautions.  Left radial styloid fracture/left inferiomedial scapular wing fracture -Currently has left shoulder sling and left wrist splint.  Continue pain management.  Outpatient follow-up with orthopedic surgery.  Hypertension -Blood pressure still on the lower side.   Home antihypertensive on hold.  History of CAD status post PCI Hyperlipidemia -Resume home medications including Zetia and pravastatin once med rec is complete.  Imdur on hold  Hypothyroidism -Continue Synthroid  Thrombocytopenia -Platelets 73 on 11/13/2022.  No labs today.  Patient has chronic thrombocytopenia but baseline around 120.  Monitor.  Hypokalemia -No labs today.  DVT prophylaxis: SCDs Code Status: DNR Family Communication: None at bedside Disposition Plan: Status is: inpatient because: Of severity of illness.  Need for CIR placement  Consultants: None  Procedures: None  Antimicrobials: None   Subjective: Patient seen and examined at bedside.  Still has some headache and left upper extremity pain.  No current dizziness.  No fever, vomiting reported. Objective: Vitals:   11/14/22 0045 11/14/22 0100 11/14/22 0105 11/14/22 0420  BP:    (!) 120/47  Pulse:    (!) 53  Resp:    20  Temp:    98.7 F (37.1 C)  TempSrc:    Oral  SpO2: 92% (!) 89% 94% 91%  Weight:      Height:       No intake or output data in the 24 hours ending 11/14/22 0736  Filed Weights   11/12/22 1545 11/13/22 0100  Weight: 78.9 kg 83.1 kg    Examination:  General: On room air.  No distress.  Scalp dressing present. ENT/neck: No thyromegaly.  JVD is not elevated  respiratory: Decreased breath sounds at bases bilaterally with some crackles; no wheezing  CVS: S1-S2 heard, rate controlled currently Abdominal: Soft, nontender, slightly distended; no organomegaly, bowel sounds are heard Extremities: Trace lower extremity edema; no cyanosis.  Left upper extremity is in a sling with left wrist splint CNS: Awake and alert.  No focal neurologic deficit.  Moves extremities Lymph: No obvious lymphadenopathy Skin: No obvious  ecchymosis/lesions  psych: Not agitated.  Mostly flat affect.   Musculoskeletal: No obvious joint swelling/deformity     Data Reviewed: I have personally reviewed  following labs and imaging studies  CBC: Recent Labs  Lab 11/12/22 1915 11/13/22 0624  WBC 7.4 5.9  NEUTROABS 6.2  --   HGB 13.4 13.0  HCT 41.4 39.5  MCV 102.5* 100.5*  PLT 79* 73*    Basic Metabolic Panel: Recent Labs  Lab 11/12/22 1915 11/13/22 0624  NA 141 139  K 3.5 3.3*  CL 105 102  CO2 24 29  GLUCOSE 149* 136*  BUN 18 15  CREATININE 0.75 0.76  CALCIUM 8.7* 9.4    GFR: Estimated Creatinine Clearance: 53.3 mL/min (by C-G formula based on SCr of 0.76 mg/dL). Liver Function Tests: Recent Labs  Lab 11/12/22 1915 11/13/22 0624  AST 40 30  ALT 17 16  ALKPHOS 79 71  BILITOT 1.2 1.2  PROT 5.6* 5.7*  ALBUMIN 2.9* 2.8*    No results for input(s): "LIPASE", "AMYLASE" in the last 168 hours. No results for input(s): "AMMONIA" in the last 168 hours. Coagulation Profile: No results for input(s): "INR", "PROTIME" in the last 168 hours. Cardiac Enzymes: No results for input(s): "CKTOTAL", "CKMB", "CKMBINDEX", "TROPONINI" in the last 168 hours. BNP (last 3 results) No results for input(s): "PROBNP" in the last 8760 hours. HbA1C: No results for input(s): "HGBA1C" in the last 72 hours. CBG: Recent Labs  Lab 11/12/22 1547  GLUCAP 135*    Lipid Profile: No results for input(s): "CHOL", "HDL", "LDLCALC", "TRIG", "CHOLHDL", "LDLDIRECT" in the last 72 hours. Thyroid Function Tests: No results for input(s): "TSH", "T4TOTAL", "FREET4", "T3FREE", "THYROIDAB" in the last 72 hours. Anemia Panel: No results for input(s): "VITAMINB12", "FOLATE", "FERRITIN", "TIBC", "IRON", "RETICCTPCT" in the last 72 hours. Sepsis Labs: No results for input(s): "PROCALCITON", "LATICACIDVEN" in the last 168 hours.  No results found for this or any previous visit (from the past 240 hour(s)).       Radiology Studies: CT CHEST ABDOMEN PELVIS WO CONTRAST  Result Date: 11/12/2022 CLINICAL DATA:  Blunt trauma. EXAM: CT CHEST, ABDOMEN AND PELVIS WITHOUT CONTRAST TECHNIQUE: Multidetector  CT imaging of the chest, abdomen and pelvis was performed following the standard protocol without IV contrast. RADIATION DOSE REDUCTION: This exam was performed according to the departmental dose-optimization program which includes automated exposure control, adjustment of the mA and/or kV according to patient size and/or use of iterative reconstruction technique. COMPARISON:  CT abdomen pelvis dated 10/22/2019. FINDINGS: Evaluation of this exam is limited in the absence of intravenous contrast. CT CHEST FINDINGS Cardiovascular: There is no cardiomegaly or pericardial effusion. There is 3 vessel coronary vascular calcification. Moderate atherosclerotic calcification of the thoracic aorta. No aneurysmal dilatation. Mild dilatation of the main pulmonary trunk suggestive of pulmonary hypertension. Clinical correlation is recommended. Mediastinum/Nodes: No hilar or mediastinal adenopathy. The esophagus is grossly unremarkable. No mediastinal fluid collection. Lungs/Pleura: There are bibasilar linear atelectasis/scarring. No focal consolidation, pleural effusion, or pneumothorax. The central airways are patent. Musculoskeletal: Fracture of the inferior medial left scapular wing. Osteopenia with degenerative changes of the spine. Mild L3 compression fracture, age indeterminate. Correlation with clinical exam and point tenderness recommended. No retropulsion. CT ABDOMEN PELVIS FINDINGS No intra-abdominal free air.  Small perihepatic ascites. Hepatobiliary: Cirrhosis. No biliary ductal dilatation. Cholecystectomy. Pancreas: Unremarkable. No pancreatic ductal dilatation or surrounding inflammatory changes. Spleen: Mild splenomegaly measuring 14 cm in length. Adrenals/Urinary Tract: The adrenal glands are unremarkable. There is a 3 mm nonobstructing left  renal interpolar calculus. No hydronephrosis. The right kidney is unremarkable. The visualized ureters and urinary bladder appear unremarkable. Stomach/Bowel: There is no  bowel obstruction or active inflammation. The appendix is normal. Vascular/Lymphatic: Moderate aortoiliac atherosclerotic disease. Left common iliac vein stent noted. Evaluation of the vessels is limited in the absence of intravenous contrast. No portal venous gas. There is no adenopathy. Reproductive: Hysterectomy.  No adnexal masses. Other: None Musculoskeletal: Osteopenia with degenerative changes of the spine. L4-L5 disc spacer and posterior fusion. No acute osseous pathology. IMPRESSION: 1. Fracture of the inferio-medial left scapular wing. 2. Mild L3 compression fracture, age indeterminate. Correlation with clinical exam and point tenderness recommended. No retropulsion. 3. Cirrhosis with small perihepatic ascites and mild splenomegaly. 4. A 3 mm nonobstructing left renal interpolar calculus. No hydronephrosis. 5.  Aortic Atherosclerosis (ICD10-I70.0). Electronically Signed   By: Anner Crete M.D.   On: 11/12/2022 21:42   CT Head Wo Contrast  Result Date: 11/12/2022 CLINICAL DATA:  Head and neck trauma. Fall down steps landing on left side. Hematoma to back of head. EXAM: CT HEAD WITHOUT CONTRAST CT CERVICAL SPINE WITHOUT CONTRAST TECHNIQUE: Multidetector CT imaging of the head and cervical spine was performed following the standard protocol without intravenous contrast. Multiplanar CT image reconstructions of the cervical spine were also generated. RADIATION DOSE REDUCTION: This exam was performed according to the departmental dose-optimization program which includes automated exposure control, adjustment of the mA and/or kV according to patient size and/or use of iterative reconstruction technique. COMPARISON:  CT head and cervical spine 06/20/2021. FINDINGS: CT HEAD FINDINGS Brain: No acute hemorrhage. Unchanged chronic small-vessel disease. Cortical gray-white differentiation is otherwise preserved. Prominence of the ventricles and sulci within normal limits for age. No extra-axial collection.  Basilar cisterns are patent. Vascular: No hyperdense vessel or unexpected calcification. Skull: No calvarial fracture or suspicious bone lesion. Skull base is unremarkable. Sinuses/Orbits: Unremarkable. Other: Large posterior scalp hematoma. CT CERVICAL SPINE FINDINGS Alignment: Normal. Skull base and vertebrae: Postoperative changes of C5-C7 ACDF. Intact hardware. No hardware associated lucency or fracture. Normal craniocervical junction. No acute cervical spine fracture. Soft tissues and spinal canal: No prevertebral fluid or swelling. No visible canal hematoma. Disc levels:  No high-grade spinal canal stenosis. Upper chest: Unremarkable. Other: Atherosclerotic calcifications of the carotid bulbs. IMPRESSION: 1. No acute intracranial abnormality. Large posterior scalp hematoma without underlying calvarial fracture. 2. No acute cervical spine fracture or traumatic listhesis. 3. Postoperative changes of C5-C7 ACDF. Intact hardware. Electronically Signed   By: Emmit Alexanders M.D.   On: 11/12/2022 17:05   CT Cervical Spine Wo Contrast  Result Date: 11/12/2022 CLINICAL DATA:  Head and neck trauma. Fall down steps landing on left side. Hematoma to back of head. EXAM: CT HEAD WITHOUT CONTRAST CT CERVICAL SPINE WITHOUT CONTRAST TECHNIQUE: Multidetector CT imaging of the head and cervical spine was performed following the standard protocol without intravenous contrast. Multiplanar CT image reconstructions of the cervical spine were also generated. RADIATION DOSE REDUCTION: This exam was performed according to the departmental dose-optimization program which includes automated exposure control, adjustment of the mA and/or kV according to patient size and/or use of iterative reconstruction technique. COMPARISON:  CT head and cervical spine 06/20/2021. FINDINGS: CT HEAD FINDINGS Brain: No acute hemorrhage. Unchanged chronic small-vessel disease. Cortical gray-white differentiation is otherwise preserved. Prominence of  the ventricles and sulci within normal limits for age. No extra-axial collection. Basilar cisterns are patent. Vascular: No hyperdense vessel or unexpected calcification. Skull: No calvarial fracture or suspicious bone lesion.  Skull base is unremarkable. Sinuses/Orbits: Unremarkable. Other: Large posterior scalp hematoma. CT CERVICAL SPINE FINDINGS Alignment: Normal. Skull base and vertebrae: Postoperative changes of C5-C7 ACDF. Intact hardware. No hardware associated lucency or fracture. Normal craniocervical junction. No acute cervical spine fracture. Soft tissues and spinal canal: No prevertebral fluid or swelling. No visible canal hematoma. Disc levels:  No high-grade spinal canal stenosis. Upper chest: Unremarkable. Other: Atherosclerotic calcifications of the carotid bulbs. IMPRESSION: 1. No acute intracranial abnormality. Large posterior scalp hematoma without underlying calvarial fracture. 2. No acute cervical spine fracture or traumatic listhesis. 3. Postoperative changes of C5-C7 ACDF. Intact hardware. Electronically Signed   By: Emmit Alexanders M.D.   On: 11/12/2022 17:05   DG Shoulder Left  Result Date: 11/12/2022 CLINICAL DATA:  Left shoulder pain after fall. EXAM: LEFT SHOULDER - 2+ VIEW COMPARISON:  None Available. FINDINGS: There is no evidence of fracture or dislocation. There is no evidence of arthropathy or other focal bone abnormality. Soft tissues are unremarkable. IMPRESSION: Negative. Electronically Signed   By: Marijo Conception M.D.   On: 11/12/2022 16:44   DG Pelvis 1-2 Views  Result Date: 11/12/2022 CLINICAL DATA:  Chronic sacral pain. EXAM: PELVIS - 1-2 VIEW COMPARISON:  None Available. FINDINGS: There is no evidence of pelvic fracture or diastasis. No pelvic bone lesions are seen. IMPRESSION: Negative. Electronically Signed   By: Marijo Conception M.D.   On: 11/12/2022 16:43   DG Humerus Left  Result Date: 11/12/2022 CLINICAL DATA:  Left arm pain after fall. EXAM: LEFT HUMERUS -  2+ VIEW COMPARISON:  None Available. FINDINGS: There is no evidence of fracture or other focal bone lesions. Soft tissues are unremarkable. IMPRESSION: Negative. Electronically Signed   By: Marijo Conception M.D.   On: 11/12/2022 16:42   DG Chest 1 View  Result Date: 11/12/2022 CLINICAL DATA:  Trauma.  Left shoulder pain. EXAM: CHEST  1 VIEW COMPARISON:  Chest radiographs 03/08/2022 and 11/16/2021 FINDINGS: Cardiac silhouette is again moderately enlarged. Moderate calcification is again seen within the aortic arch. Mild bilateral chronic interstitial thickening is unchanged. Additional unchanged left costophrenic angle linear chronic scarring. No acute airspace opacity. No pleural effusion pneumothorax. ACDF hardware again overlies the lower cervical spine. IMPRESSION: 1. No acute lung process. 2. Stable cardiomegaly and chronic interstitial thickening. Electronically Signed   By: Yvonne Kendall M.D.   On: 11/12/2022 16:42   DG Wrist Complete Left  Result Date: 11/12/2022 CLINICAL DATA:  Status post fall 3 weeks ago. Pain. Bruising and swelling. EXAM: LEFT WRIST - COMPLETE 3+ VIEW COMPARISON:  Left hand radiographs 02/17/2014 FINDINGS: There is diffuse decreased bone mineralization. There is oblique linear lucency indicating an acute fracture of the radial styloid approximately 6 mm proximal from the distal tip. No significant displacement on frontal view, however there may be up to 3 mm diastasis at the fracture line seen on oblique view. There is also a 4 x 2 mm chronic calcific density just distal to the radial styloid. Moderate to severe thumb carpometacarpal and moderate triscaphe joint space narrowing with thumb carpometacarpal subchondral sclerosis and mild peripheral osteophytosis. IMPRESSION: 1. Acute mildly displaced fracture of the radial styloid. 2. 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 Electronically Signed   By: Yvonne Kendall M.D.   On: 11/12/2022 10:17    DG Wrist Complete Right  Result Date: 11/12/2022 CLINICAL DATA:  Status post fall 3 weeks ago.  Pain. EXAM: RIGHT WRIST - COMPLETE 3+ VIEW COMPARISON:  Right  hand radiographs 06/26/2021 FINDINGS: There is diffuse decreased bone mineralization. Severe thumb carpometacarpal joint space narrowing, subchondral sclerosis, and peripheral osteophytosis is similar to prior. Severe triscaphe joint space narrowing with unchanged bone-on-bone contact. There is minimal relative widening of the proximal aspect of the scaphoid interval measuring up to 3 mm compared to the lunotriquetral ligament interval measuring 1.5 mm. There is dorsal angulation of the lunate with the scapholunate angle measuring 91 degrees. No acute fracture is seen. No dislocation. IMPRESSION: 1. No acute fracture is seen. 2. Dorsal angulation of the lunate with the scapholunate angle measuring 91 degrees. This can be seen with dorsal intercalated segment instability. 3. 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 Electronically Signed   By: Yvonne Kendall M.D.   On: 11/12/2022 10:13        Scheduled Meds:  levothyroxine  88 mcg Oral Q0600   Continuous Infusions:        Aline August, MD [facility] 11/14/2022, 7:36 AM '

## 2022-11-14 NOTE — NC FL2 (Signed)
[place] LEVEL OF CARE FORM     IDENTIFICATION  Patient Name: Jill Preston Birthdate: 08-12-1933 Sex: female Admission Date (Current Location): 11/12/2022  [facility] and [place] Number:  Herbalist and Address:  [facility] [place], [place] [address], [place], [place] [address]      Provider Number: [id number]  Attending Physician Name and Address:  Aline August, MD  Relative Name and Phone Number:       Current Level of Care: Hospital Recommended Level of Care: [place] Prior Approval Number:    Date Approved/Denied:   PASRR Number: [id number]  Discharge Plan: SNF    Current Diagnoses: Patient Active Problem List   Diagnosis Date Noted   Concussion 11/13/2022   Head injury due to trauma 11/12/2022   Fall 10/25/2022   Hypokalemia 03/08/2022   Sick sinus syndrome ([place]) 03/07/2022   Hordeolum externum (stye) 05/16/2021   Chronic otitis media with serous effusion 05/16/2021   Thrombocytopenia ([place]) 04/17/2021   PAD (peripheral artery disease) ([place]) 08/01/2018   Herniated intervertebral disc of lumbar spine 06/02/2018   Bilateral leg pain 01/27/2017   Cervical spine degeneration 10/23/2015   Hypersomnolence 10/23/2015   S/P IVC filter 02/15/2015   Angina pectoris ([place]) 01/26/2015   CAD in native artery    Ischemic chest pain ([place])    Edema of left lower extremity 09/16/2014   Acute deep vein thrombosis of left lower extremity ([place]) 09/14/2014   Anemia associated with acute blood loss 09/14/2014   Subclavian artery stenosis, left (HCC) 08/04/2014   Chronic diastolic CHF (congestive heart failure) ([place]) 07/16/2014   Essential hypertension 07/08/2014   CKD (chronic kidney disease), stage III (HCC)    Hypertensive heart disease    Chronic venous insufficiency    Metatarsal deformity 06/24/2013   CAD - LAD stent 12/00. Patent '02 and 1/13, moderate D2 managed medically 02/26/2013   Dyslipidemia 02/26/2013    DJD (degenerative joint disease)- back 02/26/2013   Asthma 08/06/2012   Hyperlipidemia [id number]   Lichen sclerosus [id number]   Rectocele 06/03/2012   Recurrent UTI 06/03/2012   Urge urinary incontinence 06/03/2012   Voiding dysfunction 06/03/2012   Hypothyroidism 07/23/2011   AKI (acute kidney injury) ([place]) 07/23/2011    Orientation RESPIRATION BLADDER Height & Weight     Self, Time, Situation, Place  Normal Incontinent Weight: 183 lb 3.2 oz (83.1 kg) Height:  5' 5.5" (166.4 cm)  BEHAVIORAL SYMPTOMS/MOOD NEUROLOGICAL BOWEL NUTRITION STATUS      Continent Diet (regular)  AMBULATORY STATUS COMMUNICATION OF NEEDS Skin   Limited Assist Verbally Skin abrasions (laceration, head, gauze dressing: change PRN)                       Personal Care Assistance Level of Assistance  Bathing, Feeding, Dressing Bathing Assistance: Limited assistance Feeding assistance: Limited assistance Dressing Assistance: Limited assistance     Functional Limitations Info             SPECIAL CARE FACTORS FREQUENCY  PT (By licensed PT), OT (By licensed OT)     PT Frequency: 5x/wk OT Frequency: 5x/wk            Contractures Contractures Info: Not present    Additional Factors Info  Code Status, Allergies Code Status Info: DNR Allergies Info: Atorvastatin, Penicillins, Shellfish Allergy, Aminophylline, Iodinated Contrast Media, Latex, Oxybutynin Chloride, Propoxyphene, Vancomycin, Adhesive (Tape), Betadine (Povidone Iodine), Ciprofloxacin, Codeine, Ranolazine  Current Medications (11/14/2022):  This is the current hospital active medication list Current Facility-Administered Medications  Medication Dose Route Frequency Provider Last Rate Last Admin   acetaminophen (TYLENOL) tablet 650 mg  650 mg Oral Q6H PRN Irene Pap N, DO   650 mg at 11/14/22 0432   HYDROcodone-acetaminophen (NORCO/VICODIN) 5-325 MG per tablet 1 tablet  1 tablet Oral Q6H PRN Lyndee Hensen, DO   1  tablet at 11/13/22 1258   levothyroxine (SYNTHROID) tablet 88 mcg  88 mcg Oral Q0600 BrimageRonnette Juniper, DO   88 mcg at 11/14/22 [id number]     Discharge Medications: Please see discharge summary for a list of discharge medications.  Relevant Imaging Results:  Relevant Lab Results:   Additional Information SS#: [id number]  Geralynn Ochs, LCSW

## 2022-11-14 NOTE — Plan of Care (Signed)

## 2022-11-15 LAB — CBC WITH DIFFERENTIAL/PLATELET
Abs Immature Granulocytes: 0.01 10*3/uL (ref 0.00–0.07)
Basophils Absolute: 0 10*3/uL (ref 0.0–0.1)
Basophils Relative: 1 %
Eosinophils Absolute: 0.5 10*3/uL (ref 0.0–0.5)
Eosinophils Relative: 9 %
HCT: 37.8 % (ref 36.0–46.0)
Hemoglobin: 12.4 g/dL (ref 12.0–15.0)
Immature Granulocytes: 0 %
Lymphocytes Relative: 19 %
Lymphs Abs: 1.1 10*3/uL (ref 0.7–4.0)
MCH: 33 pg (ref 26.0–34.0)
MCHC: 32.8 g/dL (ref 30.0–36.0)
MCV: 100.5 fL — ABNORMAL HIGH (ref 80.0–100.0)
Monocytes Absolute: 0.5 10*3/uL (ref 0.1–1.0)
Monocytes Relative: 9 %
Neutro Abs: 3.6 10*3/uL (ref 1.7–7.7)
Neutrophils Relative %: 62 %
Platelets: 78 10*3/uL — ABNORMAL LOW (ref 150–400)
RBC: 3.76 MIL/uL — ABNORMAL LOW (ref 3.87–5.11)
RDW: 13.6 % (ref 11.5–15.5)
WBC: 5.7 10*3/uL (ref 4.0–10.5)
nRBC: 0 % (ref 0.0–0.2)

## 2022-11-15 LAB — BASIC METABOLIC PANEL
Anion gap: 7 (ref 5–15)
BUN: 15 mg/dL (ref 8–23)
CO2: 30 mmol/L (ref 22–32)
Calcium: 8.5 mg/dL — ABNORMAL LOW (ref 8.9–10.3)
Chloride: 100 mmol/L (ref 98–111)
Creatinine, Ser: 0.74 mg/dL (ref 0.44–1.00)
GFR, Estimated: >60 mL/min (ref >60)
Glucose, Bld: 98 mg/dL (ref 70–99)
Potassium: 3.8 mmol/L (ref 3.5–5.1)
Sodium: 137 mmol/L (ref 135–145)

## 2022-11-15 LAB — MAGNESIUM: Magnesium: 2 mg/dL (ref 1.7–2.4)

## 2022-11-15 MED ORDER — EZETIMIBE 10 MG PO TABS
10.0000 mg | ORAL_TABLET | Freq: Every morning | ORAL | Status: DC
  Administered 2022-11-16 – 2022-11-17 (×2): 10 mg via ORAL
  Filled 2022-11-15 (×2): qty 1

## 2022-11-15 MED ORDER — PRAVASTATIN SODIUM 40 MG PO TABS
80.0000 mg | ORAL_TABLET | Freq: Every day | ORAL | Status: DC
  Administered 2022-11-15 – 2022-11-16 (×2): 80 mg via ORAL
  Filled 2022-11-15 (×2): qty 2

## 2022-11-15 MED ORDER — SERTRALINE HCL 50 MG PO TABS
25.0000 mg | ORAL_TABLET | Freq: Every day | ORAL | Status: DC
  Administered 2022-11-15 – 2022-11-17 (×3): 25 mg via ORAL
  Filled 2022-11-15 (×3): qty 1

## 2022-11-15 NOTE — TOC Progression Note (Signed)
Transition of Care [facility]) - Progression Note    Patient Details  Name: Jill Preston MRN: [id number] Date of Birth: September 19, 1932  Transition of Care [facility]) CM/SW [place], [place] Phone Number: 11/15/2022, 9:52 AM  Clinical Narrative:   CSW notified by [facility] that they are able to accept. CSW contacted [employer] to initiate insurance authorization. CSW to follow.    Expected Discharge Plan: Skilled Nursing Facility Barriers to Discharge: Ship broker, Continued Medical Work up  Expected Discharge Plan and Services   Discharge Planning Services: CM Consult Post Acute Care Choice: IP Rehab Living arrangements for the past 2 months: Single Family Home                                       Social Determinants of Health (SDOH) Interventions SDOH Screenings   Food Insecurity: Food Insecurity Present (11/13/2022)  Housing: Low Risk  (11/13/2022)  Transportation Needs: No Transportation Needs (11/13/2022)  Utilities: Not At Risk (11/13/2022)  Depression (PHQ2-9): Low Risk  (09/04/2022)  Tobacco Use: Low Risk  (11/13/2022)    Readmission Risk Interventions     No data to display

## 2022-11-15 NOTE — Progress Notes (Signed)
PROGRESS NOTE    Jill Preston  [id number] DOB: 02-08-1933 DOA: 11/12/2022 PCP: Sandrea Hughs, NP   Brief Narrative: Jill Preston is 89 y.o. female with a history of chronic diastolic heart failure, CAD s/p PCI, PAD, DVT not on anticoagulation, chronic thrombocytopenia, hyperlipidemia, hypothyroidism, hypertension, CVAA, sick sinus syndrome. Patient presented secondary to a fall, suffering head, left shoulder and left wrist injuries. Physical therapy recommending acute inpatient rehabilitation.   Assessment and Plan:  Traumatic head injury Concussion Secondary to fall. CT head without acute intracranial abnormality; large posterior scalp hematoma noted. -Supportive care  Scalp laceration Secondary to fall and head trauma. Laceration managed with 8 staples placed in the emergency department on 2/26. -Remove staples 7 days after placement  Left radial styloid fracture Left inferomedial scapular wing fracture Patient placed in a shoulder sling and wrist splint. Orthopedic surgery formally consulted on 2/29 and recommend to continue wrist splint and to keep wrist non-weight bearing. Patient is weight bearing as tolerated though elbow. Further recommendation for follow-up with Dr. Ala Bent in 1-2 weeks.  Right knee instability Recommendation from orthopedic surgery for patient to follow-up with Dr. Marlou Sa vs Dr. Percell Miller.  Primary hypertension Home antihypertensives held secondary to soft blood pressures. Blood pressure normotensive/slightly low-normal. -Trend BPs  Hypothyroidism -Continue Synthroid  CAD Hyperlipidemia S/p PCI. -Resume home Zetia and pravastatin  Thrombocytopenia Stable.  Hypokalemia Mild. Resolved with repletion.  DVT prophylaxis: SCDs Code Status:   Code Status: DNR Family Communication: Husband and grandson at bedside Disposition Plan: Discharge to SNF pending bed availability. Medically stable for discharge.   Consultants:   Orthopedic surgery  Procedures:  Laceration repair  Antimicrobials: None    Subjective: Patient reports left arm pain and back pain.  Objective: BP (!) 133/45 (BP Location: Right Arm)   Pulse (!) 55   Temp 98.2 F (36.8 C) (Oral)   Resp 18   Ht 5' 5.5" (1.664 m)   Wt 87.1 kg   SpO2 95%   BMI 31.47 kg/m   Examination:  General exam: Appears calm and comfortable Respiratory system: Clear to auscultation. Respiratory effort normal. Cardiovascular system: S1 & S2 heard, RRR. Gastrointestinal system: Abdomen is nondistended, soft and nontender. No organomegaly or masses felt. Normal bowel sounds heard. Central nervous system: Alert and oriented. No focal neurological deficits. Musculoskeletal: No edema. No calf tenderness. Left arm in sling. Psychiatry: Judgement and insight appear normal. Mood & affect appropriate.    Data Reviewed: I have personally reviewed following labs and imaging studies  CBC Lab Results  Component Value Date   WBC 5.7 11/15/2022   RBC 3.76 (L) 11/15/2022   HGB 12.4 11/15/2022   HCT 37.8 11/15/2022   MCV 100.5 (H) 11/15/2022   MCH 33.0 11/15/2022   PLT 78 (L) 11/15/2022   MCHC 32.8 11/15/2022   RDW 13.6 11/15/2022   LYMPHSABS 1.1 11/15/2022   MONOABS 0.5 11/15/2022   EOSABS 0.5 11/15/2022   BASOSABS 0.0 0000000     Last metabolic panel Lab Results  Component Value Date   NA 137 11/15/2022   K 3.8 11/15/2022   CL 100 11/15/2022   CO2 30 11/15/2022   BUN 15 11/15/2022   CREATININE 0.74 11/15/2022   GLUCOSE 98 11/15/2022   GFRNONAA >60 11/15/2022   GFRAA 66 02/27/2021   CALCIUM 8.5 (L) 11/15/2022   PHOS 2.2 (L) 07/24/2011   PROT 5.7 (L) 11/13/2022   ALBUMIN 2.8 (L) 11/13/2022   LABGLOB 2.2 03/26/2018   AGRATIO 1.9 03/26/2018  BILITOT 1.2 11/13/2022   ALKPHOS 71 11/13/2022   AST 30 11/13/2022   ALT 16 11/13/2022   ANIONGAP 7 11/15/2022    GFR: Estimated Creatinine Clearance: 54.6 mL/min (by C-G formula based on SCr  of 0.74 mg/dL).  No results found for this or any previous visit (from the past 240 hour(s)).    Radiology Studies: No results found.    LOS: 2 days    Cordelia Poche, MD [facility] 11/15/2022, 1:24 PM   If 7PM-7AM, please contact night-coverage [email or web address]

## 2022-11-15 NOTE — Progress Notes (Signed)
Physical Therapy Treatment Patient Details Name: Jill Preston MRN: [id number] DOB: [birth year] Today's Date: 11/15/2022   History of Present Illness Pt is 89 yo female who presents on 11/12/22 after a fall down 2 steps. Pt with L radial styloid fx, L inferomedial scapular wing fx, L3 compression fx, large scalp hematoma. Of note she also fell at doctor's on 10/24/22 and hit her head. PMH: chronic diastolic HF, CAD s/p PCI, PAD, DVT (no anticoagulation), chronic thrombocytopenia, HTN, HLD, hypothyroidism, CVA, sick sinus syndrome, ACDF    PT Comments    Pt is making good, gradual progress with mobility, ambulating an increased distance of up to ~70 ft with a L platform walker and minA-modA. She reports improved L knee pain, evident by her increased ease with ambulating. She also reports improved dizziness, no longer getting dizzy when turning her head L or R but when transitioning sidelying to sit and when looking inferiorly today. Appears to be more likely related to her concussion than vestibular involvement considering the inconsistencies in aggravating movements each day. Will continue to follow acutely. Pt unable to go to [facility], thus updated recs to SNF.     Recommendations for follow up therapy are one component of a multi-disciplinary discharge planning process, led by the attending physician.  Recommendations may be updated based on patient status, additional functional criteria and insurance authorization.  Follow Up Recommendations  Skilled nursing-short term rehab (<3 hours/day) (declined for [facility]) Can patient physically be transported by private vehicle: Yes   Assistance Recommended at Discharge Frequent or constant Supervision/Assistance  Patient can return home with the following Assistance with cooking/housework;Assistance with feeding;Assist for transportation;Help with stairs or ramp for entrance;A lot of help with walking and/or transfers;A lot of help with  bathing/dressing/bathroom;Direct supervision/assist for medications management;Direct supervision/assist for financial management   Equipment Recommendations  Other (comment) (TBD)    Recommendations for Other Services       Precautions / Restrictions Precautions Precautions: Fall;Back Precaution Booklet Issued: No Precaution Comments: dizziness with mobility Required Braces or Orthoses: Sling;Splint/Cast (Per Dr. Starla Link and Silvestre Gunner, PA Ortho 11/14/22 - no brace for lumbar fx) Splint/Cast: L wrist Restrictions Weight Bearing Restrictions: Yes LUE Weight Bearing: Weight bear through elbow only Other Position/Activity Restrictions: Per Silvestre Gunner, PA Ortho 11/14/22 -NWB through hand, WBAT through elbow, no ROM restrictions     Mobility  Bed Mobility Overal bed mobility: Needs Assistance Bed Mobility: Rolling, Sidelying to Sit Rolling: Min assist Sidelying to sit: Min assist, HOB elevated       General bed mobility comments: cueing for technique with minA to rotate trunk and ascend trunk to sit up R EOB    Transfers Overall transfer level: Needs assistance Equipment used: Left platform walker, None Transfers: Sit to/from Stand, Bed to chair/wheelchair/BSC Sit to Stand: Min assist   Step pivot transfers: Mod assist       General transfer comment: MinA to transfer to stand 3x from recliner, 1x from commode, and 1x from EOB, cuing for R hand placement. ModA for stability with stand step recliner <> commode without UE support    Ambulation/Gait Ambulation/Gait assistance: Min assist, Mod assist Gait Distance (Feet): 70 Feet (x2 bouts of ~70 ft > ~2 ft) Assistive device: Left platform walker, None Gait Pattern/deviations: Step-through pattern, Decreased stride length, Decreased stance time - left, Decreased weight shift to left, Antalgic Gait velocity: reduced Gait velocity interpretation: <1.31 ft/sec, indicative of household ambulator   General Gait Details:  Pt with improved L knee pain  today, but still mild antalgic gait pattern noted. Needs cues to improve upright posture and look forward along with cues for L UE placement in platform of walker. MinA for stability using platform walker. ModA for few steps without UE support from chair to/from commode   Stairs             Wheelchair Mobility    Modified Rankin (Stroke Patients Only)       Balance Overall balance assessment: Needs assistance Sitting-balance support: No upper extremity supported, Feet supported Sitting balance-Leahy Scale: Fair Sitting balance - Comments: min guard to supervision for safety due to dizziness   Standing balance support: During functional activity, Single extremity supported, Bilateral upper extremity supported Standing balance-Leahy Scale: Poor Standing balance comment: relies on UE support and external support                            Cognition Arousal/Alertness: Awake/alert Behavior During Therapy: WFL for tasks assessed/performed Overall Cognitive Status: Impaired/Different from baseline Area of Impairment: Attention, Problem solving, Awareness, Memory                   Current Attention Level: Sustained Memory: Decreased short-term memory, Decreased recall of precautions     Awareness: Emergent Problem Solving: Slow processing, Requires verbal cues General Comments: patient requires cueing to sustain attention to task with min cueing to redirect, slow processing at times.  internally distracted by pain and dizziness, but good awareness of deficits. Needs reminders of her precautions        Exercises      General Comments        Pertinent Vitals/Pain Pain Assessment Pain Assessment: Faces Faces Pain Scale: Hurts even more Pain Location: L shoulder and ribs, L knee Pain Descriptors / Indicators: Sore, Grimacing, Guarding, Discomfort Pain Intervention(s): Monitored during session, Limited activity within  patient's tolerance, Repositioned    Home Living                          Prior Function            PT Goals (current goals can now be found in the care plan section) Acute Rehab PT Goals Patient Stated Goal: to improve PT Goal Formulation: With patient/family Time For Goal Achievement: 11/27/22 Potential to Achieve Goals: Good Progress towards PT goals: Progressing toward goals    Frequency    Min 3X/week      PT Plan Discharge plan needs to be updated;Frequency needs to be updated    Co-evaluation              AM-PAC PT "6 Clicks" Mobility   Outcome Measure  Help needed turning from your back to your side while in a flat bed without using bedrails?: A Little Help needed moving from lying on your back to sitting on the side of a flat bed without using bedrails?: A Little Help needed moving to and from a bed to a chair (including a wheelchair)?: A Lot Help needed standing up from a chair using your arms (e.g., wheelchair or bedside chair)?: A Little Help needed to walk in hospital room?: A Lot Help needed climbing 3-5 steps with a railing? : Total 6 Click Score: 14    End of Session Equipment Utilized During Treatment: Gait belt Activity Tolerance: Patient tolerated treatment well Patient left: in chair;with call bell/phone within reach;with chair alarm set;with family/visitor present Nurse Communication: Mobility  status PT Visit Diagnosis: Muscle weakness (generalized) (M62.81);Dizziness and giddiness (R42);Pain;Difficulty in walking, not elsewhere classified (R26.2);Unsteadiness on feet (R26.81);Other abnormalities of gait and mobility (R26.89) Pain - Right/Left: Left Pain - part of body: Shoulder;Arm     Time: [id number] PT Time Calculation (min) (ACUTE ONLY): 40 min  Charges:  $Gait Training: 8-22 mins $Therapeutic Activity: 23-37 mins                     Moishe Spice, PT, DPT Acute Rehabilitation Services  Office:  [place] 11/15/2022, 3:32 PM

## 2022-11-15 NOTE — Consult Note (Signed)
Reason for Consult:Left wrist/scapula fxs Referring Physician: Cordelia Poche Time called: [id number] Time at bedside: [place] is an 89 y.o. female.  HPI: Kameran fell a couple of times in the last week. She hurt her left wrist and left scapula. Workup showed a radial styloid and inferior scapula fx. Hand surgery was consulted a couple of days into her hospital stay. She is LHD and lives at home with her husband. She does not use any assistive devices to ambulate.  Past Medical History:  Diagnosis Date   Arthritis    right knee   Cancer ([place]) yrs ago   skin cancer removed from face   Chronic diastolic CHF (congestive heart failure) (HCC)    Chronic venous insufficiency    LEA VENOUS, 10/17/2011 - mild reflux in bilateral common femoral veins   CKD (chronic kidney disease), stage III ([place])    Coronary artery disease    a. s/p PCI/BMS to prox LAD and balloon angioplasty to mLAD with suboptimal result in 2000. b. Abnl nuc 2012, cath 09/2011 - showed that the overall territory of potential ischemia was small and attributable to a moderately diseased small second diagonal artery. Med rx.   Dyspnea    with activity   Headache    History of blood transfusion 2017   Hyperlipidemia    Hypertension    Hypertensive heart disease    Hypothyroidism    Obesity    Pneumonia    several times   Stroke [facility])    pt. states she had "light stroke" in sept. 1980   Urinary incontinence     Past Surgical History:  Procedure Laterality Date   ABDOMINAL HYSTERECTOMY  1970's   complete   ANKLE ARTHROSCOPY Right 07/10/2017   Procedure: ANKLE ARTHROSCOPY;  Surgeon: [name], DPM;  Location: [facility];  Service: Podiatry;  Laterality: Right;   BACK SURGERY  07/26/2016   cervical neck surgery, [facility]   BALLOON DILATION N/A 12/01/2021   Procedure: BALLOON DILATION;  Surgeon: Carol Ada, MD;  Location: [facility];  Service: Gastroenterology;  Laterality: N/A;    BLADDER SURGERY  2010   WITH MESH  bladder tach   bunion removal surgery Bilateral 15 yrs ago   CARDIAC CATHETERIZATION Left 09/25/2011   Medical management   CARDIAC CATHETERIZATION Left 03/25/2001   Normal LV function, LAD residual narrowing of less than 10%, normal ramus intermediate, circumflex, and RCA,    CARDIAC CATHETERIZATION  09/04/1999   LAD, 3x1m Tetra stent resulting in a reduction of the 80% stenosis to 0% residual   CARDIAC CATHETERIZATION N/A 01/26/2015   Procedure: Right/Left Heart Cath and Coronary Angiography;  Surgeon: TTroy Sine MD;  [facility];  Service: Cardiovascular;  Laterality: N/A;   CARDIAC CATHETERIZATION N/A 01/27/2015   Procedure: Intravascular Pressure Wire/FFR Study;  Surgeon: CBurnell Blanks MD;  [facility];  Service: Cardiovascular;  Laterality: N/A;   CARDIAC CATHETERIZATION N/A 01/27/2015   Procedure: Right Heart Cath;  Surgeon: CBurnell Blanks MD;  [facility];  Service: Cardiovascular;  Laterality: N/A;   CHOLECYSTECTOMY     COLONOSCOPY WITH PROPOFOL N/A 03/07/2017   Procedure: COLONOSCOPY WITH PROPOFOL;  Surgeon: MJuanita Craver MD;  [facility];  Service: Endoscopy;  Laterality: N/A;   CORONARY STENT PLACEMENT     LAD x 1   ESOPHAGOGASTRODUODENOSCOPY (EGD) WITH PROPOFOL N/A 12/01/2021   Procedure: ESOPHAGOGASTRODUODENOSCOPY (EGD) WITH PROPOFOL;  Surgeon: HBenson Norway  Saralyn Pilar, MD;  Location: [facility];  Service: Gastroenterology;  Laterality: N/A;   ganglion cyst removal  yrs ago   x 2   ILIAC VEIN ANGIOPLASTY / STENTING  02/15/2015   INTRAVASCULAR PRESSURE WIRE/FFR STUDY N/A 04/05/2017   Procedure: Intravascular Pressure Wire/FFR Study;  Surgeon: Martinique, Peter M, MD;  Location: [facility];  Service: Cardiovascular;  Laterality: N/A;   IVC FILTER INSERTION  2016   IVC FILTER REMOVAL  02/15/2015   at [place] CATH AND CORONARY ANGIOGRAPHY N/A 04/05/2017    Procedure: Left Heart Cath and Coronary Angiography;  Surgeon: Martinique, Peter M, MD;  Location: [name] [facility];  Service: Cardiovascular;  Laterality: N/A;   LEFT HEART CATHETERIZATION WITH CORONARY ANGIOGRAM N/A 09/25/2011   Procedure: LEFT HEART CATHETERIZATION WITH CORONARY ANGIOGRAM;  Surgeon: Sanda Klein, MD;  Location: [facility];  Service: Cardiovascular;  Laterality: N/A;   multiple bladder surgeries to remove mesh     ROTATOR CUFF REPAIR Right    stent to groin Left 08/2014   left leg   TENDON REPAIR Right 07/10/2017   Procedure: RIGHT PERONEAL TENDON REPAIR;  Surgeon: [name], DPM;  Location: [facility];  Service: Podiatry;  Laterality: Right;   TENDON REPAIR Right 05/27/2019   Procedure: PERONEAL TENDON REPAIR x2;  Surgeon: [name], DPM;  [facility];  Service: Podiatry;  Laterality: Right;    Family History  Problem Relation Age of Onset   Cancer Mother    Heart disease Father    Heart disease Sister    High blood pressure Sister    Heart disease Sister    Cancer Sister    Heart disease Brother    Diabetes Brother    Brain cancer Brother    High blood pressure Brother    Heart disease Brother    Heart attack Brother    Heart disease Brother    High blood pressure Brother     Social History:  reports that she has never smoked. She has never used smokeless tobacco. She reports that she does not drink alcohol and does not use drugs.  Allergies:  Allergies  Allergen Reactions   Atorvastatin Anaphylaxis and Other (See Comments)    Patient does not remember; caused pneumonia- took her off of it per patient   Penicillins Anaphylaxis, Hives, Swelling and Other (See Comments)    Tongue swelling Did it involve swelling of the face/tongue/throat, SOB, or low BP? Yes Did it involve sudden or severe rash/hives, skin peeling, or any reaction on the inside of your mouth or nose? Yes Did you need to seek medical attention at a hospital or doctor's office?  Yes When did it last happen?  69 or 89 years old    If all above answers are "NO", may proceed with cephalosporin use.    Shellfish Allergy Anaphylaxis, Nausea And Vomiting and Other (See Comments)    Severe nausea and vomiting   Aminophylline Itching, Swelling, Rash and Other (See Comments)    Pt experienced burning urination, itching, redness/rash, and swelling of genitals after given this medication via IV push.   Iodinated Contrast Media Swelling and Other (See Comments)    FLUSHING, also; LLE became swollen   Latex Other (See Comments)    Causes blisters   Oxybutynin Chloride Other (See Comments)    "blisters"   Propoxyphene Other (See Comments)    Unknown reaction   Vancomycin Hives and Other (See Comments)  06/02/2018- Hives arm, back and chest   Adhesive [Tape] Rash and Other (See Comments)    Paper tape is ok   Betadine [Povidone Iodine] Rash   Ciprofloxacin Hives   Codeine Nausea And Vomiting    .   Ranolazine Other (See Comments)    Constipation  .    Medications: I have reviewed the patient's current medications.  Results for orders placed or performed during the hospital encounter of 11/12/22 (from the past 48 hour(s))  CBC with Differential/Platelet     Status: Abnormal   Collection Time: 11/15/22  4:53 AM  Result Value Ref Range   WBC 5.7 4.0 - 10.5 K/uL   RBC 3.76 (L) 3.87 - 5.11 MIL/uL   Hemoglobin 12.4 12.0 - 15.0 g/dL   HCT 37.8 36.0 - 46.0 %   MCV 100.5 (H) 80.0 - 100.0 fL   MCH 33.0 26.0 - 34.0 pg   MCHC 32.8 30.0 - 36.0 g/dL   RDW 13.6 11.5 - 15.5 %   Platelets 78 (L) 150 - 400 K/uL    Comment: Immature Platelet Fraction may be clinically indicated, consider ordering this additional test [id number] REPEATED TO VERIFY    nRBC 0.0 0.0 - 0.2 %   Neutrophils Relative % 62 %   Neutro Abs 3.6 1.7 - 7.7 K/uL   Lymphocytes Relative 19 %   Lymphs Abs 1.1 0.7 - 4.0 K/uL   Monocytes Relative 9 %   Monocytes Absolute 0.5 0.1 - 1.0 K/uL   Eosinophils  Relative 9 %   Eosinophils Absolute 0.5 0.0 - 0.5 K/uL   Basophils Relative 1 %   Basophils Absolute 0.0 0.0 - 0.1 K/uL   Immature Granulocytes 0 %   Abs Immature Granulocytes 0.01 0.00 - 0.07 K/uL    Comment: Performed at [facility], [address], [place], [place] [id number]  Basic metabolic panel     Status: Abnormal   Collection Time: 11/15/22  4:53 AM  Result Value Ref Range   Sodium 137 135 - 145 mmol/L   Potassium 3.8 3.5 - 5.1 mmol/L   Chloride 100 98 - 111 mmol/L   CO2 30 22 - 32 mmol/L   Glucose, Bld 98 70 - 99 mg/dL    Comment: Glucose reference range applies only to samples taken after fasting for at least 8 hours.   BUN 15 8 - 23 mg/dL   Creatinine, Ser 0.74 0.44 - 1.00 mg/dL   Calcium 8.5 (L) 8.9 - 10.3 mg/dL   GFR, Estimated >60 >60 mL/min    Comment: (NOTE) Calculated using the CKD-EPI Creatinine Equation (2021)    Anion gap 7 5 - 15    Comment: Performed at [facility] [address], [place], [place] [address]  Magnesium     Status: None   Collection Time: 11/15/22  4:53 AM  Result Value Ref Range   Magnesium 2.0 1.7 - 2.4 mg/dL    Comment: Performed at [facility] [address], [place], [place] [address]    No results found.  Review of Systems  HENT:  Negative for ear discharge, ear pain, hearing loss and tinnitus.   Eyes:  Negative for photophobia and pain.  Respiratory:  Negative for cough and shortness of breath.   Cardiovascular:  Negative for chest pain.  Gastrointestinal:  Negative for abdominal pain, nausea and vomiting.  Genitourinary:  Negative for dysuria, flank pain, frequency and urgency.  Musculoskeletal:  Positive for arthralgias (Left wrist) and  back pain. Negative for myalgias and neck pain.  Neurological:  Negative for dizziness and headaches.  Hematological:  Does not bruise/bleed easily.  Psychiatric/Behavioral:  The patient is not nervous/anxious.    Blood pressure (!) 133/45, pulse (!) 55, temperature 98.2  F (36.8 C), temperature source Oral, resp. rate 18, height 5' 5.5" (1.664 m), weight 87.1 kg, SpO2 95 %. Physical Exam Constitutional:      General: She is not in acute distress.    Appearance: She is well-developed. She is not diaphoretic.  HENT:     Head: Normocephalic and atraumatic.  Eyes:     General: No scleral icterus.       Right eye: No discharge.        Left eye: No discharge.     Conjunctiva/sclera: Conjunctivae normal.  Cardiovascular:     Rate and Rhythm: Normal rate and regular rhythm.  Pulmonary:     Effort: Pulmonary effort is normal. No respiratory distress.  Musculoskeletal:     Cervical back: Normal range of motion.     Comments: Left shoulder, elbow, wrist, digits- no skin wounds, mod TTP wrist, no instability, no blocks to motion  Sens  Ax/R/M/U intact  Mot   Ax/ R/ PIN/ M/ AIN/ U intact  Rad 2+  Skin:    General: Skin is warm and dry.  Neurological:     Mental Status: She is alert.  Psychiatric:        Mood and Affect: Mood normal.        Behavior: Behavior normal.     Assessment/Plan: Left wrist fx -- Continue removable splint and NWB. May be WBAT through elbow. F/u with Dr. Ala Bent in 1-2 weeks. Left scapula fx -- Non-operative management. Does not need f/u for this fx. Right knee instability -- May f/u with Dr. Marlou Sa or Dr. Percell Miller.    Lisette Abu, PA-C Orthopedic Surgery [phone] 11/15/2022, 2:15 PM

## 2022-11-15 NOTE — Hospital Course (Addendum)
Jill Preston is 89 y.o. female with a history of chronic diastolic heart failure, CAD s/p PCI, PAD, DVT not on anticoagulation, chronic thrombocytopenia, hyperlipidemia, hypothyroidism, hypertension, CVAA, sick sinus syndrome. Patient presented secondary to a fall, suffering head, left shoulder and left wrist injuries. Physical therapy recommending acute inpatient rehabilitation initially, but final recommendation to discharge to SNF.

## 2022-11-15 NOTE — Progress Notes (Signed)
Occupational Therapy Treatment Patient Details Name: Jill Preston MRN: [id number] DOB: Aug 09, 1933 Today's Date: 11/15/2022   History of present illness Pt is 89 yo female who presents on 11/12/22 after a fall down 2 steps. Pt with L radial styloid fx, L inferomedial scapular wing fx, L3 compression fx, large scalp hematoma. Of note she also fell at doctor's on 10/24/22 and hit her head. PMH: chronic diastolic HF, CAD s/p PCI, PAD, DVT (no anticoagulation), chronic thrombocytopenia, HTN, HLD, hypothyroidism, CVA, sick sinus syndrome, ACDF   OT comments  Patient received and asking to get OOB. Patient pleasant and eager to participate. Patient stated light dizziness from sidelying to sitting on EOB but decreased with increased time. Patient making good gains with OT treatment with mod assist to stand from EOB and transfer to recliner with left platform walker. Patient requiring min assist for grooming due to limited LUE use. Patient with complaints of pain during shoulder ROM, tolerating on 1-2 reps. Patient would benefit from continued OT treatment in SNF setting to address functional transfers, bathing, and dressing. Acute OT to continue to follow.    Recommendations for follow up therapy are one component of a multi-disciplinary discharge planning process, led by the attending physician.  Recommendations may be updated based on patient status, additional functional criteria and insurance authorization.    Follow Up Recommendations  Skilled nursing-short term rehab (<3 hours/day)     Assistance Recommended at Discharge Frequent or constant Supervision/Assistance  Patient can return home with the following  A lot of help with walking and/or transfers;A lot of help with bathing/dressing/bathroom;Assistance with cooking/housework;Direct supervision/assist for medications management;Direct supervision/assist for financial management;Assist for transportation;Help with stairs or ramp for entrance    Equipment Recommendations  Other (comment) (defer)    Recommendations for Other Services      Precautions / Restrictions Precautions Precautions: Fall;Back Precaution Booklet Issued: No Precaution Comments: dizziness with mobility Required Braces or Orthoses: Sling;Splint/Cast (per Dr Starla Link and Silvestre Gunner, PA Ortho 11/14/22 no brace for lumbar fx) Splint/Cast: L wrist Restrictions Weight Bearing Restrictions: Yes LUE Weight Bearing: Weight bear through elbow only Other Position/Activity Restrictions: Per Silvestre Gunner, PA Ortho 11/14/22 -NWB through hand, WBAT through elbow, no ROM restrictions       Mobility Bed Mobility Overal bed mobility: Needs Assistance Bed Mobility: Rolling, Sidelying to Sit Rolling: Min assist Sidelying to sit: Min assist, HOB elevated       General bed mobility comments: cues for technique and min assist to raise trunk    Transfers Overall transfer level: Needs assistance Equipment used: Left platform walker Transfers: Sit to/from Stand, Bed to chair/wheelchair/BSC Sit to Stand: Mod assist     Step pivot transfers: Mod assist     General transfer comment: mod assist to power up from EOB and with walker management. cues for hand placement and to reach back to sit.     Balance Overall balance assessment: Needs assistance Sitting-balance support: No upper extremity supported, Feet supported Sitting balance-Leahy Scale: Fair Sitting balance - Comments: able to maintain sitting balance on EOB with complaints of dizziness intially but decreased with time   Standing balance support: During functional activity, Single extremity supported, Bilateral upper extremity supported Standing balance-Leahy Scale: Poor Standing balance comment: reliant on UE support for balance                           ADL either performed or assessed with clinical judgement   ADL Overall  ADL's : Needs assistance/impaired     Grooming: Minimal  assistance;Sitting;Wash/dry hands;Wash/dry face;Oral care Grooming Details (indicate cue type and reason): min assist with oral care                 Toilet Transfer: Moderate assistance;Rolling walker (2 wheels) (Left platform walker) Toilet Transfer Details (indicate cue type and reason): simulated to recliner with left platform walker           General ADL Comments: limited by pain    Extremity/Trunk Assessment Upper Extremity Assessment LUE Deficits / Details: in sling with scapular fx and wrist brace with radial styloid fx. kept NWB. LUE Sensation: decreased light touch LUE Coordination: decreased fine motor;decreased gross motor            Vision       Perception     Praxis      Cognition Arousal/Alertness: Awake/alert Behavior During Therapy: WFL for tasks assessed/performed Overall Cognitive Status: Impaired/Different from baseline Area of Impairment: Attention, Problem solving, Awareness, Memory                   Current Attention Level: Sustained Memory: Decreased short-term memory, Decreased recall of precautions     Awareness: Emergent Problem Solving: Slow processing, Requires verbal cues, Decreased initiation General Comments: able to recall situation and WB precautions, cues to stay on task and safety        Exercises Exercises: General Upper Extremity General Exercises - Upper Extremity Shoulder Flexion: AAROM, Left, Seated Shoulder ABduction: AAROM, Left, Seated Elbow Flexion: AROM, Left, 15 reps, Seated Elbow Extension: AROM, Left, 15 reps, Seated    Shoulder Instructions       General Comments      Pertinent Vitals/ Pain       Pain Assessment Pain Assessment: 0-10 Pain Score: 10-Worst pain ever Pain Location: LUE, ribs, and Left knee Pain Descriptors / Indicators: Grimacing, Guarding, Sore, Discomfort Pain Intervention(s): Limited activity within patient's tolerance, Monitored during session, Repositioned  Home  Living                                          Prior Functioning/Environment              Frequency  Min 2X/week        Progress Toward Goals  OT Goals(current goals can now be found in the care plan section)  Progress towards OT goals: Progressing toward goals  Acute Rehab OT Goals Patient Stated Goal: get better OT Goal Formulation: With patient Time For Goal Achievement: 11/27/22 Potential to Achieve Goals: Fair ADL Goals Pt Will Perform Grooming: with supervision;standing Pt Will Perform Lower Body Dressing: with supervision;sit to/from stand;with adaptive equipment Pt Will Transfer to Toilet: with supervision;ambulating;stand pivot transfer;bedside commode Additional ADL Goal #1: Pt will complete bed mobility with supervision as precursor to ADLs.  Plan Discharge plan remains appropriate    Co-evaluation                 AM-PAC OT "6 Clicks" Daily Activity     Outcome Measure   Help from another person eating meals?: A Little Help from another person taking care of personal grooming?: A Little Help from another person toileting, which includes using toliet, bedpan, or urinal?: A Lot Help from another person bathing (including washing, rinsing, drying)?: A Lot Help from another person to put on and taking off regular  upper body clothing?: A Lot Help from another person to put on and taking off regular lower body clothing?: A Lot 6 Click Score: 14    End of Session Equipment Utilized During Treatment: Gait belt;Rolling walker (2 wheels);Other (comment) (left sling and left platform)  OT Visit Diagnosis: Other abnormalities of gait and mobility (R26.89);Muscle weakness (generalized) (M62.81);Pain;Dizziness and giddiness (R42);History of falling (Z91.81) Pain - Right/Left: Left Pain - part of body: Shoulder   Activity Tolerance Patient tolerated treatment well   Patient Left in chair;with call bell/phone within reach;with chair alarm  set   Nurse Communication Mobility status;Precautions        Time: [id number] OT Time Calculation (min): 26 min  Charges: OT General Charges $OT Visit: 1 Visit OT Treatments $Self Care/Home Management : 8-22 mins $Therapeutic Exercise: 8-22 mins  Lodema Hong, [place]  Office [phone]   Trixie Dredge 11/15/2022, 10:38 AM

## 2022-11-16 DIAGNOSIS — I5032 Chronic diastolic (congestive) heart failure: Secondary | ICD-10-CM

## 2022-11-16 DIAGNOSIS — E039 Hypothyroidism, unspecified: Secondary | ICD-10-CM

## 2022-11-16 NOTE — Plan of Care (Signed)
  Problem: Education: Goal: Knowledge of General Education information will improve Description: Including pain rating scale, medication(s)/side effects and non-pharmacologic comfort measures 11/16/2022 0819 by Monica Becton, RN Outcome: Progressing

## 2022-11-16 NOTE — Progress Notes (Addendum)
PROGRESS NOTE    Jill Preston  [id number] DOB: 05/02/1933 DOA: 11/12/2022 PCP: Sandrea Hughs, NP   Brief Narrative: Jill Preston is 89 y.o. female with a history of chronic diastolic heart failure, CAD s/p PCI, PAD, DVT not on anticoagulation, chronic thrombocytopenia, hyperlipidemia, hypothyroidism, hypertension, CVAA, sick sinus syndrome. Patient presented secondary to a fall, suffering head, left shoulder and left wrist injuries. Physical therapy recommending acute inpatient rehabilitation.   Assessment and Plan:  Traumatic head injury Concussion Secondary to fall. CT head without acute intracranial abnormality; large posterior scalp hematoma noted. -Supportive care  Scalp laceration Secondary to fall and head trauma. Laceration managed with 8 staples placed in the emergency department on 2/26. -Remove staples 7-10 days after placement  Left radial styloid fracture Left inferomedial scapular wing fracture Patient placed in a shoulder sling and wrist splint. Orthopedic surgery formally consulted on 2/29 and recommend to continue wrist splint and to keep wrist non-weight bearing. Patient is weight bearing as tolerated though elbow. Further recommendation for follow-up with Dr. Ala Bent in 1-2 weeks.  Right knee instability Recommendation from orthopedic surgery for patient to follow-up with Dr. Marlou Sa vs Dr. Percell Miller.  Primary hypertension Home antihypertensives held secondary to soft blood pressures. Blood pressure normotensive/slightly low-normal. -Trend BPs  Hypothyroidism -Continue Synthroid  CAD Hyperlipidemia S/p PCI. -Continue home Zetia and pravastatin  Thrombocytopenia Stable.  Hypokalemia Mild. Resolved with repletion.  DVT prophylaxis: SCDs Code Status:   Code Status: DNR Family Communication: None at bedside Disposition Plan: Discharge to SNF pending bed availability. Medically stable for discharge.   Consultants:  Orthopedic  surgery  Procedures:  Laceration repair  Antimicrobials: None    Subjective: No specific issues this morning.  Objective: BP (!) 112/52 (BP Location: Right Arm)   Pulse 61   Temp 98.7 F (37.1 C) (Oral)   Resp 18   Ht 5' 5.5" (1.664 m)   Wt 83.8 kg   SpO2 91%   BMI 30.28 kg/m   Examination:  General: Well appearing, no distress   Data Reviewed: I have personally reviewed following labs and imaging studies  CBC Lab Results  Component Value Date   WBC 5.7 11/15/2022   RBC 3.76 (L) 11/15/2022   HGB 12.4 11/15/2022   HCT 37.8 11/15/2022   MCV 100.5 (H) 11/15/2022   MCH 33.0 11/15/2022   PLT 78 (L) 11/15/2022   MCHC 32.8 11/15/2022   RDW 13.6 11/15/2022   LYMPHSABS 1.1 11/15/2022   MONOABS 0.5 11/15/2022   EOSABS 0.5 11/15/2022   BASOSABS 0.0 0000000     Last metabolic panel Lab Results  Component Value Date   NA 137 11/15/2022   K 3.8 11/15/2022   CL 100 11/15/2022   CO2 30 11/15/2022   BUN 15 11/15/2022   CREATININE 0.74 11/15/2022   GLUCOSE 98 11/15/2022   GFRNONAA >60 11/15/2022   GFRAA 66 02/27/2021   CALCIUM 8.5 (L) 11/15/2022   PHOS 2.2 (L) 07/24/2011   PROT 5.7 (L) 11/13/2022   ALBUMIN 2.8 (L) 11/13/2022   LABGLOB 2.2 03/26/2018   AGRATIO 1.9 03/26/2018   BILITOT 1.2 11/13/2022   ALKPHOS 71 11/13/2022   AST 30 11/13/2022   ALT 16 11/13/2022   ANIONGAP 7 11/15/2022    GFR: Estimated Creatinine Clearance: 53.5 mL/min (by C-G formula based on SCr of 0.74 mg/dL).  No results found for this or any previous visit (from the past 240 hour(s)).    Radiology Studies: No results found.    LOS:  3 days    Cordelia Poche, MD [facility] 11/16/2022, 2:20 PM   If 7PM-7AM, please contact night-coverage [email or web address]

## 2022-11-16 NOTE — Care Management Important Message (Signed)
Important Message  Patient Details  Name: Jill Preston MRN: [id number] Date of Birth: [birth year]   Medicare Important Message Given:  Yes     Orbie Pyo 11/16/2022, 2:57 PM

## 2022-11-16 NOTE — Progress Notes (Signed)
Mobility Specialist: Progress Note   11/16/22 1139  Mobility  Activity Ambulated with assistance in hallway  Level of Assistance Minimal assist, patient does 75% or more  Assistive Device Front wheel walker  Distance Ambulated (ft) 100 ft  LUE Weight Bearing Weight bear through elbow only  Activity Response Tolerated well  Mobility Referral Yes  $Mobility charge 1 Mobility   Pre-Mobility: 66 HR, 90% SpO2 Post-Mobility: 65 HR, 91% SpO2  Pt received in the bed and agreeable to mobility. MinA with bed mobility as well as to stand. Ambulated on RA this session. C/o 6/10 LUE pain during ambulation, otherwise asymptomatic. Pt to the chair after session with call bell and phone in reach.   Soudan Majed P[place] Mobility Specialist Please contact via SecureChat or Rehab office at [phone]

## 2022-11-16 NOTE — Discharge Summary (Signed)
Physician Discharge Summary   Patient: Jill Preston MRN: [id number] DOB: 21-Dec-1932  Admit date:     11/12/2022  Discharge date: {dischdate:26783}  Discharge Physician: Cordelia Poche, MD   PCP: Sandrea Hughs, NP   Recommendations at discharge:  ***  Discharge Diagnoses: Principal Problem:   Head injury due to trauma Active Problems:   Hypothyroidism   CAD - LAD stent 12/00. Patent '02 and 1/13, moderate D2 managed medically   Essential hypertension   Chronic diastolic CHF (congestive heart failure) (HCC)   Hyperlipidemia   Thrombocytopenia (HCC)   Concussion  Resolved Problems:   * No resolved hospital problems. *  Hospital Course: Jill Preston is 89 y.o. female with a history of chronic diastolic heart failure, CAD s/p PCI, PAD, DVT not on anticoagulation, chronic thrombocytopenia, hyperlipidemia, hypothyroidism, hypertension, CVAA, sick sinus syndrome. Patient presented secondary to a fall, suffering head, left shoulder and left wrist injuries. Physical therapy recommending acute inpatient rehabilitation.  Assessment and Plan:  Traumatic head injury Concussion Secondary to fall. CT head without acute intracranial abnormality; large posterior scalp hematoma noted. -Supportive care   Scalp laceration Secondary to fall and head trauma. Laceration managed with 8 staples placed in the emergency department on 2/26. -Remove staples 7 days after placement   Left radial styloid fracture Left inferomedial scapular wing fracture Patient placed in a shoulder sling and wrist splint. Orthopedic surgery formally consulted on 2/29 and recommend to continue wrist splint and to keep wrist non-weight bearing. Patient is weight bearing as tolerated though elbow. Further recommendation for follow-up with Dr. Ala Bent in 1-2 weeks.   Right knee instability Recommendation from orthopedic surgery for patient to follow-up with Dr. Marlou Sa vs Dr. Percell Miller.   Primary  hypertension Home antihypertensives held secondary to soft blood pressures. Blood pressure normotensive/slightly low-normal. -Trend BPs   Hypothyroidism -Continue Synthroid   CAD Hyperlipidemia S/p PCI. -Resume home Zetia and pravastatin   Thrombocytopenia Stable.   Hypokalemia Mild. Resolved with repletion.   Consultants:  Orthopedic surgery  Procedures performed:  2/26: Laceration repair   Disposition: Skilled nursing facility Diet recommendation:  {Diet_Plan:26776} DISCHARGE MEDICATION: Allergies as of 11/16/2022       Reactions   Atorvastatin Anaphylaxis, Other (See Comments)   Patient does not remember; caused pneumonia- took her off of it per patient   Penicillins Anaphylaxis, Hives, Swelling, Other (See Comments)   Tongue swelling Did it involve swelling of the face/tongue/throat, SOB, or low BP? Yes Did it involve sudden or severe rash/hives, skin peeling, or any reaction on the inside of your mouth or nose? Yes Did you need to seek medical attention at a hospital or doctor's office? Yes When did it last happen?  26 or 89 years old    If all above answers are "NO", may proceed with cephalosporin use.   Shellfish Allergy Anaphylaxis, Nausea And Vomiting, Other (See Comments)   Severe nausea and vomiting   Aminophylline Itching, Swelling, Rash, Other (See Comments)   Pt experienced burning urination, itching, redness/rash, and swelling of genitals after given this medication via IV push.   Iodinated Contrast Media Swelling, Other (See Comments)   FLUSHING, also; LLE became swollen   Latex Other (See Comments)   Causes blisters   Oxybutynin Chloride Other (See Comments)   "blisters"   Propoxyphene Other (See Comments)   Unknown reaction   Vancomycin Hives, Other (See Comments)   06/02/2018- Hives arm, back and chest   Adhesive [tape] Rash, Other (See Comments)   Paper  tape is ok   Betadine [povidone Iodine] Rash   Ciprofloxacin Hives   Codeine Nausea And  Vomiting   .   Ranolazine Other (See Comments)   Constipation .     Med Rec must be completed prior to using this [facility]***       Discharge Exam: Filed Weights   11/13/22 0100 11/15/22 0412 11/16/22 0336  Weight: 83.1 kg 87.1 kg 83.8 kg   ***  Condition at discharge: {DC Condition:26389}  The results of significant diagnostics from this hospitalization (including imaging, microbiology, ancillary and laboratory) are listed below for reference.   Imaging Studies: CT CHEST ABDOMEN PELVIS WO CONTRAST  Result Date: 11/12/2022 CLINICAL DATA:  Blunt trauma. EXAM: CT CHEST, ABDOMEN AND PELVIS WITHOUT CONTRAST TECHNIQUE: Multidetector CT imaging of the chest, abdomen and pelvis was performed following the standard protocol without IV contrast. RADIATION DOSE REDUCTION: This exam was performed according to the departmental dose-optimization program which includes automated exposure control, adjustment of the mA and/or kV according to patient size and/or use of iterative reconstruction technique. COMPARISON:  CT abdomen pelvis dated 10/22/2019. FINDINGS: Evaluation of this exam is limited in the absence of intravenous contrast. CT CHEST FINDINGS Cardiovascular: There is no cardiomegaly or pericardial effusion. There is 3 vessel coronary vascular calcification. Moderate atherosclerotic calcification of the thoracic aorta. No aneurysmal dilatation. Mild dilatation of the main pulmonary trunk suggestive of pulmonary hypertension. Clinical correlation is recommended. Mediastinum/Nodes: No hilar or mediastinal adenopathy. The esophagus is grossly unremarkable. No mediastinal fluid collection. Lungs/Pleura: There are bibasilar linear atelectasis/scarring. No focal consolidation, pleural effusion, or pneumothorax. The central airways are patent. Musculoskeletal: Fracture of the inferior medial left scapular wing. Osteopenia with degenerative changes of the spine. Mild L3 compression fracture, age  indeterminate. Correlation with clinical exam and point tenderness recommended. No retropulsion. CT ABDOMEN PELVIS FINDINGS No intra-abdominal free air.  Small perihepatic ascites. Hepatobiliary: Cirrhosis. No biliary ductal dilatation. Cholecystectomy. Pancreas: Unremarkable. No pancreatic ductal dilatation or surrounding inflammatory changes. Spleen: Mild splenomegaly measuring 14 cm in length. Adrenals/Urinary Tract: The adrenal glands are unremarkable. There is a 3 mm nonobstructing left renal interpolar calculus. No hydronephrosis. The right kidney is unremarkable. The visualized ureters and urinary bladder appear unremarkable. Stomach/Bowel: There is no bowel obstruction or active inflammation. The appendix is normal. Vascular/Lymphatic: Moderate aortoiliac atherosclerotic disease. Left common iliac vein stent noted. Evaluation of the vessels is limited in the absence of intravenous contrast. No portal venous gas. There is no adenopathy. Reproductive: Hysterectomy.  No adnexal masses. Other: None Musculoskeletal: Osteopenia with degenerative changes of the spine. L4-L5 disc spacer and posterior fusion. No acute osseous pathology. IMPRESSION: 1. Fracture of the inferio-medial left scapular wing. 2. Mild L3 compression fracture, age indeterminate. Correlation with clinical exam and point tenderness recommended. No retropulsion. 3. Cirrhosis with small perihepatic ascites and mild splenomegaly. 4. A 3 mm nonobstructing left renal interpolar calculus. No hydronephrosis. 5.  Aortic Atherosclerosis (ICD10-I70.0). Electronically Signed   By: Anner Crete M.D.   On: 11/12/2022 21:42   CT Head Wo Contrast  Result Date: 11/12/2022 CLINICAL DATA:  Head and neck trauma. Fall down steps landing on left side. Hematoma to back of head. EXAM: CT HEAD WITHOUT CONTRAST CT CERVICAL SPINE WITHOUT CONTRAST TECHNIQUE: Multidetector CT imaging of the head and cervical spine was performed following the standard protocol  without intravenous contrast. Multiplanar CT image reconstructions of the cervical spine were also generated. RADIATION DOSE REDUCTION: This exam was performed according to the departmental dose-optimization program which includes  automated exposure control, adjustment of the mA and/or kV according to patient size and/or use of iterative reconstruction technique. COMPARISON:  CT head and cervical spine 06/20/2021. FINDINGS: CT HEAD FINDINGS Brain: No acute hemorrhage. Unchanged chronic small-vessel disease. Cortical gray-white differentiation is otherwise preserved. Prominence of the ventricles and sulci within normal limits for age. No extra-axial collection. Basilar cisterns are patent. Vascular: No hyperdense vessel or unexpected calcification. Skull: No calvarial fracture or suspicious bone lesion. Skull base is unremarkable. Sinuses/Orbits: Unremarkable. Other: Large posterior scalp hematoma. CT CERVICAL SPINE FINDINGS Alignment: Normal. Skull base and vertebrae: Postoperative changes of C5-C7 ACDF. Intact hardware. No hardware associated lucency or fracture. Normal craniocervical junction. No acute cervical spine fracture. Soft tissues and spinal canal: No prevertebral fluid or swelling. No visible canal hematoma. Disc levels:  No high-grade spinal canal stenosis. Upper chest: Unremarkable. Other: Atherosclerotic calcifications of the carotid bulbs. IMPRESSION: 1. No acute intracranial abnormality. Large posterior scalp hematoma without underlying calvarial fracture. 2. No acute cervical spine fracture or traumatic listhesis. 3. Postoperative changes of C5-C7 ACDF. Intact hardware. Electronically Signed   By: Emmit Alexanders M.D.   On: 11/12/2022 17:05   CT Cervical Spine Wo Contrast  Result Date: 11/12/2022 CLINICAL DATA:  Head and neck trauma. Fall down steps landing on left side. Hematoma to back of head. EXAM: CT HEAD WITHOUT CONTRAST CT CERVICAL SPINE WITHOUT CONTRAST TECHNIQUE: Multidetector CT  imaging of the head and cervical spine was performed following the standard protocol without intravenous contrast. Multiplanar CT image reconstructions of the cervical spine were also generated. RADIATION DOSE REDUCTION: This exam was performed according to the departmental dose-optimization program which includes automated exposure control, adjustment of the mA and/or kV according to patient size and/or use of iterative reconstruction technique. COMPARISON:  CT head and cervical spine 06/20/2021. FINDINGS: CT HEAD FINDINGS Brain: No acute hemorrhage. Unchanged chronic small-vessel disease. Cortical gray-white differentiation is otherwise preserved. Prominence of the ventricles and sulci within normal limits for age. No extra-axial collection. Basilar cisterns are patent. Vascular: No hyperdense vessel or unexpected calcification. Skull: No calvarial fracture or suspicious bone lesion. Skull base is unremarkable. Sinuses/Orbits: Unremarkable. Other: Large posterior scalp hematoma. CT CERVICAL SPINE FINDINGS Alignment: Normal. Skull base and vertebrae: Postoperative changes of C5-C7 ACDF. Intact hardware. No hardware associated lucency or fracture. Normal craniocervical junction. No acute cervical spine fracture. Soft tissues and spinal canal: No prevertebral fluid or swelling. No visible canal hematoma. Disc levels:  No high-grade spinal canal stenosis. Upper chest: Unremarkable. Other: Atherosclerotic calcifications of the carotid bulbs. IMPRESSION: 1. No acute intracranial abnormality. Large posterior scalp hematoma without underlying calvarial fracture. 2. No acute cervical spine fracture or traumatic listhesis. 3. Postoperative changes of C5-C7 ACDF. Intact hardware. Electronically Signed   By: Emmit Alexanders M.D.   On: 11/12/2022 17:05   DG Shoulder Left  Result Date: 11/12/2022 CLINICAL DATA:  Left shoulder pain after fall. EXAM: LEFT SHOULDER - 2+ VIEW COMPARISON:  None Available. FINDINGS: There is no  evidence of fracture or dislocation. There is no evidence of arthropathy or other focal bone abnormality. Soft tissues are unremarkable. IMPRESSION: Negative. Electronically Signed   By: Marijo Conception M.D.   On: 11/12/2022 16:44   DG Pelvis 1-2 Views  Result Date: 11/12/2022 CLINICAL DATA:  Chronic sacral pain. EXAM: PELVIS - 1-2 VIEW COMPARISON:  None Available. FINDINGS: There is no evidence of pelvic fracture or diastasis. No pelvic bone lesions are seen. IMPRESSION: Negative. Electronically Signed   By: Sabino Dick  Jr M.D.   On: 11/12/2022 16:43   DG Humerus Left  Result Date: 11/12/2022 CLINICAL DATA:  Left arm pain after fall. EXAM: LEFT HUMERUS - 2+ VIEW COMPARISON:  None Available. FINDINGS: There is no evidence of fracture or other focal bone lesions. Soft tissues are unremarkable. IMPRESSION: Negative. Electronically Signed   By: Marijo Conception M.D.   On: 11/12/2022 16:42   DG Chest 1 View  Result Date: 11/12/2022 CLINICAL DATA:  Trauma.  Left shoulder pain. EXAM: CHEST  1 VIEW COMPARISON:  Chest radiographs 03/08/2022 and 11/16/2021 FINDINGS: Cardiac silhouette is again moderately enlarged. Moderate calcification is again seen within the aortic arch. Mild bilateral chronic interstitial thickening is unchanged. Additional unchanged left costophrenic angle linear chronic scarring. No acute airspace opacity. No pleural effusion pneumothorax. ACDF hardware again overlies the lower cervical spine. IMPRESSION: 1. No acute lung process. 2. Stable cardiomegaly and chronic interstitial thickening. Electronically Signed   By: Yvonne Kendall M.D.   On: 11/12/2022 16:42   DG Wrist Complete Left  Result Date: 11/12/2022 CLINICAL DATA:  Status post fall 3 weeks ago. Pain. Bruising and swelling. EXAM: LEFT WRIST - COMPLETE 3+ VIEW COMPARISON:  Left hand radiographs 02/17/2014 FINDINGS: There is diffuse decreased bone mineralization. There is oblique linear lucency indicating an acute fracture of the  radial styloid approximately 6 mm proximal from the distal tip. No significant displacement on frontal view, however there may be up to 3 mm diastasis at the fracture line seen on oblique view. There is also a 4 x 2 mm chronic calcific density just distal to the radial styloid. Moderate to severe thumb carpometacarpal and moderate triscaphe joint space narrowing with thumb carpometacarpal subchondral sclerosis and mild peripheral osteophytosis. IMPRESSION: 1. Acute mildly displaced fracture of the radial styloid. 2. 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 Electronically Signed   By: Yvonne Kendall M.D.   On: 11/12/2022 10:17   DG Wrist Complete Right  Result Date: 11/12/2022 CLINICAL DATA:  Status post fall 3 weeks ago.  Pain. EXAM: RIGHT WRIST - COMPLETE 3+ VIEW COMPARISON:  Right hand radiographs 06/26/2021 FINDINGS: There is diffuse decreased bone mineralization. Severe thumb carpometacarpal joint space narrowing, subchondral sclerosis, and peripheral osteophytosis is similar to prior. Severe triscaphe joint space narrowing with unchanged bone-on-bone contact. There is minimal relative widening of the proximal aspect of the scaphoid interval measuring up to 3 mm compared to the lunotriquetral ligament interval measuring 1.5 mm. There is dorsal angulation of the lunate with the scapholunate angle measuring 91 degrees. No acute fracture is seen. No dislocation. IMPRESSION: 1. No acute fracture is seen. 2. Dorsal angulation of the lunate with the scapholunate angle measuring 91 degrees. This can be seen with dorsal intercalated segment instability. 3. 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 Electronically Signed   By: Yvonne Kendall M.D.   On: 11/12/2022 10:13    Microbiology: Results for orders placed or performed in visit on 03/07/22  Urine Culture     Status: Abnormal   Collection Time: 03/07/22 10:14 AM   Specimen:  Urine  Result Value Ref Range Status   MICRO NUMBER: [id number]  Final   SPECIMEN QUALITY: Adequate  Final   Sample Source URINE, CLEAN CATCH  Final   STATUS: FINAL  Final   ISOLATE 1: Escherichia coli (A)  Final    Comment: Greater than 100,000 CFU/mL of Escherichia coli      Susceptibility   Escherichia coli - URINE CULTURE,  REFLEX    AMOX/CLAVULANIC <=2 Sensitive     AMPICILLIN <=2 Sensitive     AMPICILLIN/SULBACTAM <=2 Sensitive     CEFAZOLIN* <=4 Not Reportable      * For infections other than uncomplicated UTI caused by E. coli, K. pneumoniae or P. mirabilis: Cefazolin is resistant if MIC > or = 8 mcg/mL. (Distinguishing susceptible versus intermediate for isolates with MIC < or = 4 mcg/mL requires additional testing.) For uncomplicated UTI caused by E. coli, K. pneumoniae or P. mirabilis: Cefazolin is susceptible if MIC <32 mcg/mL and predicts susceptible to the oral agents cefaclor, cefdinir, cefpodoxime, cefprozil, cefuroxime, cephalexin and loracarbef.     CEFTAZIDIME <=1 Sensitive     CEFEPIME <=1 Sensitive     CEFTRIAXONE <=1 Sensitive     CIPROFLOXACIN <=0.25 Sensitive     LEVOFLOXACIN <=0.12 Sensitive     GENTAMICIN <=1 Sensitive     IMIPENEM <=0.25 Sensitive     NITROFURANTOIN <=16 Sensitive     PIP/TAZO <=4 Sensitive     TOBRAMYCIN <=1 Sensitive     TRIMETH/SULFA* <=20 Sensitive      * For infections other than uncomplicated UTI caused by E. coli, K. pneumoniae or P. mirabilis: Cefazolin is resistant if MIC > or = 8 mcg/mL. (Distinguishing susceptible versus intermediate for isolates with MIC < or = 4 mcg/mL requires additional testing.) For uncomplicated UTI caused by E. coli, K. pneumoniae or P. mirabilis: Cefazolin is susceptible if MIC <32 mcg/mL and predicts susceptible to the oral agents cefaclor, cefdinir, cefpodoxime, cefprozil, cefuroxime, cephalexin and loracarbef. Legend: S = Susceptible  I = Intermediate R = Resistant  NS = Not  susceptible * = Not tested  NR = Not reported **NN = See antimicrobic comments     Labs: CBC: Recent Labs  Lab 11/12/22 1915 11/13/22 0624 11/15/22 0453  WBC 7.4 5.9 5.7  NEUTROABS 6.2  --  3.6  HGB 13.4 13.0 12.4  HCT 41.4 39.5 37.8  MCV 102.5* 100.5* 100.5*  PLT 79* 73* 78*   Basic Metabolic Panel: Recent Labs  Lab 11/12/22 1915 11/13/22 0624 11/15/22 0453  NA 141 139 137  K 3.5 3.3* 3.8  CL 105 102 100  CO2 '24 29 30  '$ GLUCOSE 149* 136* 98  BUN '18 15 15  '$ CREATININE 0.75 0.76 0.74  CALCIUM 8.7* 9.4 8.5*  MG  --   --  2.0   Liver Function Tests: Recent Labs  Lab 11/12/22 1915 11/13/22 0624  AST 40 30  ALT 17 16  ALKPHOS 79 71  BILITOT 1.2 1.2  PROT 5.6* 5.7*  ALBUMIN 2.9* 2.8*   CBG: Recent Labs  Lab 11/12/22 1547  GLUCAP 135*    Discharge time spent: {LESS THAN/GREATER THAN:26388} 30 minutes.  Signed: Cordelia Poche, MD [facility] 11/16/2022

## 2022-11-16 NOTE — TOC Progression Note (Signed)
Transition of Care [facility]) - Progression Note    Patient Details  Name: Jill Preston MRN: [id number] Date of Birth: Jun 16, 1933  Transition of Care [facility] CM/SW [place], [place] Phone Number: 11/16/2022, 4:20 PM  Clinical Narrative:   CSW following for SNF placement. Patient is medically stable, bed is available at SNF, but insurance authorization is still pending with [employer]. CSW contacted [employer] to ask about status, and Josem Kaufmann is still pending. CSW contacted [facility] to inform them of auth pending status for over 24 hours, and TOC Leadership attempting to get a decision from insurance. Helene Kelp is unable to admit over the weekend, will need to admit to SNF on Monday pending authorization. CSW to follow.    Expected Discharge Plan: Skilled Nursing Facility Barriers to Discharge: Insurance Authorization  Expected Discharge Plan and Services   Discharge Planning Services: CM Consult Post Acute Care Choice: IP Rehab Living arrangements for the past 2 months: Single Family Home                                       Social Determinants of Health (SDOH) Interventions SDOH Screenings   Food Insecurity: Food Insecurity Present (11/13/2022)  Housing: Low Risk  (11/13/2022)  Transportation Needs: No Transportation Needs (11/13/2022)  Utilities: Not At Risk (11/13/2022)  Depression (PHQ2-9): Low Risk  (09/04/2022)  Tobacco Use: Low Risk  (11/13/2022)    Readmission Risk Interventions     No data to display

## 2022-11-17 DIAGNOSIS — E785 Hyperlipidemia, unspecified: Secondary | ICD-10-CM | POA: Diagnosis not present

## 2022-11-17 DIAGNOSIS — S0990XA Unspecified injury of head, initial encounter: Secondary | ICD-10-CM | POA: Diagnosis not present

## 2022-11-17 DIAGNOSIS — Z9181 History of falling: Secondary | ICD-10-CM | POA: Diagnosis not present

## 2022-11-17 DIAGNOSIS — M25532 Pain in left wrist: Secondary | ICD-10-CM | POA: Diagnosis not present

## 2022-11-17 DIAGNOSIS — Y92014 Private driveway to single-family (private) house as the place of occurrence of the external cause: Secondary | ICD-10-CM | POA: Diagnosis not present

## 2022-11-17 DIAGNOSIS — E039 Hypothyroidism, unspecified: Secondary | ICD-10-CM | POA: Diagnosis not present

## 2022-11-17 DIAGNOSIS — M6259 Muscle wasting and atrophy, not elsewhere classified, multiple sites: Secondary | ICD-10-CM | POA: Diagnosis not present

## 2022-11-17 DIAGNOSIS — S060XAD Concussion with loss of consciousness status unknown, subsequent encounter: Secondary | ICD-10-CM | POA: Diagnosis not present

## 2022-11-17 DIAGNOSIS — R1312 Dysphagia, oropharyngeal phase: Secondary | ICD-10-CM | POA: Diagnosis not present

## 2022-11-17 DIAGNOSIS — R2689 Other abnormalities of gait and mobility: Secondary | ICD-10-CM | POA: Diagnosis not present

## 2022-11-17 DIAGNOSIS — Y9301 Activity, walking, marching and hiking: Secondary | ICD-10-CM | POA: Diagnosis not present

## 2022-11-17 DIAGNOSIS — I10 Essential (primary) hypertension: Secondary | ICD-10-CM | POA: Diagnosis not present

## 2022-11-17 DIAGNOSIS — S0101XD Laceration without foreign body of scalp, subsequent encounter: Secondary | ICD-10-CM | POA: Diagnosis not present

## 2022-11-17 DIAGNOSIS — S52515D Nondisplaced fracture of left radial styloid process, subsequent encounter for closed fracture with routine healing: Secondary | ICD-10-CM | POA: Diagnosis not present

## 2022-11-17 DIAGNOSIS — M6281 Muscle weakness (generalized): Secondary | ICD-10-CM | POA: Diagnosis not present

## 2022-11-17 DIAGNOSIS — Z741 Need for assistance with personal care: Secondary | ICD-10-CM | POA: Diagnosis not present

## 2022-11-17 DIAGNOSIS — S52512A Displaced fracture of left radial styloid process, initial encounter for closed fracture: Secondary | ICD-10-CM | POA: Diagnosis not present

## 2022-11-17 DIAGNOSIS — W109XXA Fall (on) (from) unspecified stairs and steps, initial encounter: Secondary | ICD-10-CM | POA: Diagnosis not present

## 2022-11-17 DIAGNOSIS — I2510 Atherosclerotic heart disease of native coronary artery without angina pectoris: Secondary | ICD-10-CM | POA: Diagnosis not present

## 2022-11-17 DIAGNOSIS — D696 Thrombocytopenia, unspecified: Secondary | ICD-10-CM | POA: Diagnosis not present

## 2022-11-17 DIAGNOSIS — S42102D Fracture of unspecified part of scapula, left shoulder, subsequent encounter for fracture with routine healing: Secondary | ICD-10-CM | POA: Diagnosis not present

## 2022-11-17 DIAGNOSIS — I5032 Chronic diastolic (congestive) heart failure: Secondary | ICD-10-CM | POA: Diagnosis not present

## 2022-11-17 DIAGNOSIS — M1712 Unilateral primary osteoarthritis, left knee: Secondary | ICD-10-CM | POA: Diagnosis not present

## 2022-11-17 DIAGNOSIS — Z23 Encounter for immunization: Secondary | ICD-10-CM | POA: Diagnosis present

## 2022-11-17 NOTE — Plan of Care (Signed)
Pt is discharging to [facility]. Report given to [facility], Therapist, sports. Pt will be taken to SNF by spouse and grandson. All personal belongings were returned to pt.   Problem: Education: Goal: Knowledge of General Education information will improve Description: Including pain rating scale, medication(s)/side effects and non-pharmacologic comfort measures Outcome: Completed/Met   Problem: Health Behavior/Discharge Planning: Goal: Ability to manage health-related needs will improve Outcome: Completed/Met   Problem: Clinical Measurements: Goal: Ability to maintain clinical measurements within normal limits will improve Outcome: Completed/Met Goal: Will remain free from infection Outcome: Completed/Met Goal: Diagnostic test results will improve Outcome: Completed/Met Goal: Respiratory complications will improve Outcome: Completed/Met Goal: Cardiovascular complication will be avoided Outcome: Completed/Met   Problem: Activity: Goal: Risk for activity intolerance will decrease Outcome: Completed/Met   Problem: Nutrition: Goal: Adequate nutrition will be maintained Outcome: Completed/Met   Problem: Coping: Goal: Level of anxiety will decrease Outcome: Completed/Met   Problem: Elimination: Goal: Will not experience complications related to bowel motility Outcome: Completed/Met Goal: Will not experience complications related to urinary retention Outcome: Completed/Met   Problem: Pain Managment: Goal: General experience of comfort will improve Outcome: Completed/Met   Problem: Safety: Goal: Ability to remain free from injury will improve Outcome: Completed/Met   Problem: Skin Integrity: Goal: Risk for impaired skin integrity will decrease Outcome: Completed/Met

## 2022-11-17 NOTE — Progress Notes (Signed)
[employer] auth received for [facility]. Denial received for ambulance transport. Confirmed with Kitty at [facility] they can admit today. MD made aware.   Wandra Feinstein, MSW, LCSW [phone] (coverage)

## 2022-11-17 NOTE — TOC Transition Note (Signed)
Transition of Care [facility]) - CM/SW Discharge Note   Patient Details  Name: Jill Preston MRN: [id number] Date of Birth: 1932-10-03  Transition of Care [facility]) CM/SW Contact:  Amador Cunas, [place] Phone Number: 11/17/2022, 11:44 AM   Clinical Narrative: Pt for dc to [name] today. Spoke to [place] in admissions who confirmed they are prepared to admit pt to room 306-A. Pt's son aware of dc and plans to transport pt via private vehicle. RN provided with number for report. SW signing off at dc.   Wandra Feinstein, MSW, LCSW [phone] (coverage)        Final next level of care: Skilled Nursing Facility Barriers to Discharge: No Barriers Identified   Patient Goals and CMS Choice CMS Medicare.gov Compare Post Acute Care list provided to:: Other (Comment Required) (son) Choice offered to / list presented to : Adult Children  Discharge Placement                Patient chooses bed at: [facility] Patient to be transferred to facility by: Son to transport Name of family member notified: Son/Blane Patient and family notified of of transfer: 11/17/22  Discharge Plan and Services Additional resources added to the After Visit Summary for     Discharge Planning Services: CM Consult Post Acute Care Choice: IP Rehab                               Social Determinants of Health (SDOH) Interventions SDOH Screenings   Food Insecurity: Food Insecurity Present (11/13/2022)  Housing: Low Risk  (11/13/2022)  Transportation Needs: No Transportation Needs (11/13/2022)  Utilities: Not At Risk (11/13/2022)  Depression (PHQ2-9): Low Risk  (09/04/2022)  Tobacco Use: Low Risk  (11/13/2022)     Readmission Risk Interventions     No data to display

## 2022-11-17 NOTE — Discharge Instructions (Signed)
Jill Preston,  You were in the hospital after a fall and suffered a couple of fractures. These fractures are stable but you cannot bear weight with your left wrist. Please follow-up with the orthopedic surgeon for management of your fractures and knee instability

## 2022-11-19 DIAGNOSIS — I10 Essential (primary) hypertension: Secondary | ICD-10-CM | POA: Diagnosis not present

## 2022-11-19 DIAGNOSIS — Z9181 History of falling: Secondary | ICD-10-CM | POA: Diagnosis not present

## 2022-11-19 DIAGNOSIS — S060XAD Concussion with loss of consciousness status unknown, subsequent encounter: Secondary | ICD-10-CM | POA: Diagnosis not present

## 2022-11-19 DIAGNOSIS — E039 Hypothyroidism, unspecified: Secondary | ICD-10-CM | POA: Diagnosis not present

## 2022-11-20 DIAGNOSIS — E039 Hypothyroidism, unspecified: Secondary | ICD-10-CM | POA: Diagnosis not present

## 2022-11-20 DIAGNOSIS — Z9181 History of falling: Secondary | ICD-10-CM | POA: Diagnosis not present

## 2022-11-20 DIAGNOSIS — S060XAD Concussion with loss of consciousness status unknown, subsequent encounter: Secondary | ICD-10-CM | POA: Diagnosis not present

## 2022-11-20 DIAGNOSIS — I10 Essential (primary) hypertension: Secondary | ICD-10-CM | POA: Diagnosis not present

## 2022-11-22 DIAGNOSIS — E039 Hypothyroidism, unspecified: Secondary | ICD-10-CM | POA: Diagnosis not present

## 2022-11-22 DIAGNOSIS — Z9181 History of falling: Secondary | ICD-10-CM | POA: Diagnosis not present

## 2022-11-22 DIAGNOSIS — I10 Essential (primary) hypertension: Secondary | ICD-10-CM | POA: Diagnosis not present

## 2022-11-22 DIAGNOSIS — I2510 Atherosclerotic heart disease of native coronary artery without angina pectoris: Secondary | ICD-10-CM | POA: Diagnosis not present

## 2022-11-26 DIAGNOSIS — S52512A Displaced fracture of left radial styloid process, initial encounter for closed fracture: Secondary | ICD-10-CM | POA: Diagnosis not present

## 2022-11-26 DIAGNOSIS — M25532 Pain in left wrist: Secondary | ICD-10-CM | POA: Diagnosis not present

## 2022-11-27 DIAGNOSIS — Z9181 History of falling: Secondary | ICD-10-CM | POA: Diagnosis not present

## 2022-11-27 DIAGNOSIS — I2510 Atherosclerotic heart disease of native coronary artery without angina pectoris: Secondary | ICD-10-CM | POA: Diagnosis not present

## 2022-11-27 DIAGNOSIS — I10 Essential (primary) hypertension: Secondary | ICD-10-CM | POA: Diagnosis not present

## 2022-11-27 DIAGNOSIS — E039 Hypothyroidism, unspecified: Secondary | ICD-10-CM | POA: Diagnosis not present

## 2022-11-28 ENCOUNTER — Ambulatory Visit (INDEPENDENT_AMBULATORY_CARE_PROVIDER_SITE_OTHER): Payer: PPO

## 2022-11-28 ENCOUNTER — Telehealth: Payer: Self-pay

## 2022-11-28 ENCOUNTER — Ambulatory Visit (INDEPENDENT_AMBULATORY_CARE_PROVIDER_SITE_OTHER): Payer: PPO | Admitting: Surgical

## 2022-11-28 DIAGNOSIS — M1712 Unilateral primary osteoarthritis, left knee: Secondary | ICD-10-CM

## 2022-11-28 NOTE — Telephone Encounter (Signed)
Can we get patient approved for L knee gel inj

## 2022-11-29 ENCOUNTER — Encounter: Payer: Self-pay | Admitting: Surgical

## 2022-11-29 MED ORDER — LIDOCAINE HCL 1 % IJ SOLN
5.0000 mL | INTRAMUSCULAR | Status: AC | PRN
  Administered 2022-11-28: 5 mL

## 2022-11-29 MED ORDER — METHYLPREDNISOLONE ACETATE 40 MG/ML IJ SUSP
40.0000 mg | INTRAMUSCULAR | Status: AC | PRN
  Administered 2022-11-28: 40 mg via INTRA_ARTICULAR

## 2022-11-29 MED ORDER — BUPIVACAINE HCL 0.25 % IJ SOLN
4.0000 mL | INTRAMUSCULAR | Status: AC | PRN
  Administered 2022-11-28: 4 mL via INTRA_ARTICULAR

## 2022-11-29 NOTE — Telephone Encounter (Signed)
VOB submitted for Monovisc, left knee.  

## 2022-11-29 NOTE — Progress Notes (Signed)
Office Visit Note   Patient: Jill Preston           Date of Birth: [birth year]           MRN: [id number] Visit Date: 11/28/2022 Requested by: Sandrea Hughs, NP [address] [place],  [place] [address] PCP: Sandrea Hughs, NP  Subjective: Chief Complaint  Patient presents with   Right Knee - Pain    HPI: Jill Preston is a 89 y.o. female who presents to the office reporting left knee pain.  Patient has a history of left knee arthritis for which she has had previous injections by another provider.  She states that 2 to 3 weeks ago, her left knee popped and she fell backwards.  In this fall, she fractured her wrist.  She has seen another provider for this wrist fracture at [facility] but she is here today for her left knee pain.  She is currently staying at a rehab facility.  She has been ambulatory with physical therapy with assistance from a walker.  States that standing and bending can be painful.  She has no pain that wakes her up from sleep at night.  No groin pain.  No radicular pain.  She states that pain is diffusely throughout the knee.  No bruising or ecchymosis noted throughout the knee..                ROS: All systems reviewed are negative as they relate to the chief complaint within the history of present illness.  Patient denies fevers or chills.  Assessment & Plan: Visit Diagnoses:  1. Unilateral primary osteoarthritis, left knee     Plan: Patient is a 89 year old female who presents for evaluation of left knee pain.  She states that she felt a pop in her knee several weeks ago which caused her to fall.  She presents today with some continued knee pain but no more mechanical symptoms ever since the injury.  She has small effusion but she has been ambulatory without severe difficulty.  Radiographs taken today demonstrate no fracture or any acute injury.  She does have some arthritic changes that do not seem significantly progressed compared with prior radiographs  from about 2 years ago.  After discussion of options, plan to try aspiration and injection today.  8 cc of nonpurulent synovial fluid was aspirated from the left knee and cortisone injection administered.  We will try gel injection as well and we will see how she does when she returns for the gel injection.  This patient is diagnosed with osteoarthritis of the knee(s).    Radiographs show evidence of joint space narrowing, osteophytes, subchondral sclerosis and/or subchondral cysts.  This patient has knee pain which interferes with functional and activities of daily living.    This patient has experienced inadequate response, adverse effects and/or intolerance with conservative treatments such as acetaminophen, NSAIDS, topical creams, physical therapy or regular exercise, knee bracing and/or weight loss.   This patient has experienced inadequate response or has a contraindication to intra articular steroid injections for at least 3 months.   This patient is not scheduled to have a total knee replacement within 6 months of starting treatment with viscosupplementation.   Follow-Up Instructions: No follow-ups on file.   Orders:  Orders Placed This Encounter  Procedures   XR KNEE 3 VIEW LEFT   No orders of the defined types were placed in this encounter.     Procedures: Large Joint Inj: L  knee on 11/28/2022 3:46 PM Indications: diagnostic evaluation, joint swelling and pain Details: 18 G 1.5 in needle, superolateral approach  Arthrogram: No  Medications: 5 mL lidocaine 1 %; 40 mg methylPREDNISolone acetate 40 MG/ML; 4 mL bupivacaine 0.25 % Aspirate: 8 mL Outcome: tolerated well, no immediate complications Procedure, treatment alternatives, risks and benefits explained, specific risks discussed. Consent was given by the patient. Immediately prior to procedure a time out was called to verify the correct patient, procedure, equipment, support staff and site/side marked as required. Patient  was prepped and draped in the usual sterile fashion.       Clinical Data: No additional findings.  Objective: Vital Signs: There were no vitals taken for this visit.  Physical Exam:  Constitutional: Patient appears well-developed HEENT:  Head: Normocephalic Eyes:EOM are normal Neck: Normal range of motion Cardiovascular: Normal rate Pulmonary/chest: Effort normal Neurologic: Patient is alert Skin: Skin is warm Psychiatric: Patient has normal mood and affect  Ortho Exam: Ortho exam demonstrates left knee with small effusion.  She has tenderness over the medial joint line primarily moderately with little mild lateral joint line tenderness.  She is able to perform straight leg raise against gravity and against resistance with quad strength rated 5/5.  There is no tenderness over the quad tendon, patellar tendon, patella.  She has no tenderness in the calf.  Negative Homans' sign.  No pain with hip range of motion.  Negative Stinchfield sign.  Negative FADIR sign.  No gross laxity to varus or valgus stress at 0 or 30 degrees with no reproduction of pain.  She is stable to anterior and posterior drawer signs.  Specialty Comments:  No specialty comments available.  Imaging: No results found.   PMFS History: Patient Active Problem List   Diagnosis Date Noted   Concussion 11/13/2022   Head injury due to trauma 11/12/2022   Fall 10/25/2022   Hypokalemia 03/08/2022   Sick sinus syndrome ([place]) 03/07/2022   Hordeolum externum (stye) 05/16/2021   Chronic otitis media with serous effusion 05/16/2021   Thrombocytopenia ([place]) 04/17/2021   PAD (peripheral artery disease) ([place]) 08/01/2018   Herniated intervertebral disc of lumbar spine 06/02/2018   Bilateral leg pain 01/27/2017   Cervical spine degeneration 10/23/2015   Hypersomnolence 10/23/2015   S/P IVC filter 02/15/2015   Angina pectoris ([place]) 01/26/2015   CAD in native artery    Ischemic chest pain (HCC)    Edema of left lower  extremity 09/16/2014   Acute deep vein thrombosis of left lower extremity ([place]) 09/14/2014   Anemia associated with acute blood loss 09/14/2014   Subclavian artery stenosis, left (HCC) 08/04/2014   Chronic diastolic CHF (congestive heart failure) ([place]) 07/16/2014   Essential hypertension 07/08/2014   CKD (chronic kidney disease), stage III (HCC)    Hypertensive heart disease    Chronic venous insufficiency    Metatarsal deformity 06/24/2013   CAD - LAD stent 12/00. Patent '02 and 1/13, moderate D2 managed medically 02/26/2013   Dyslipidemia 02/26/2013   DJD (degenerative joint disease)- back 02/26/2013   Asthma 08/06/2012   Hyperlipidemia [id number]   Lichen sclerosus [id number]   Rectocele 06/03/2012   Recurrent UTI 06/03/2012   Urge urinary incontinence 06/03/2012   Voiding dysfunction 06/03/2012   Hypothyroidism 07/23/2011   AKI (acute kidney injury) ([place]) 07/23/2011   Past Medical History:  Diagnosis Date   Arthritis    right knee   Cancer ([place]) yrs ago   skin cancer removed from face   Chronic diastolic CHF (  congestive heart failure) (HCC)    Chronic venous insufficiency    LEA VENOUS, 10/17/2011 - mild reflux in bilateral common femoral veins   CKD (chronic kidney disease), stage III ([place])    Coronary artery disease    a. s/p PCI/BMS to prox LAD and balloon angioplasty to mLAD with suboptimal result in 2000. b. Abnl nuc 2012, cath 09/2011 - showed that the overall territory of potential ischemia was small and attributable to a moderately diseased small second diagonal artery. Med rx.   Dyspnea    with activity   Headache    History of blood transfusion 2017   Hyperlipidemia    Hypertension    Hypertensive heart disease    Hypothyroidism    Obesity    Pneumonia    several times   Stroke [facility])    pt. states she had "light stroke" in sept. 1980   Urinary incontinence     Family History  Problem Relation Age of Onset   Cancer Mother    Heart disease Father     Heart disease Sister    High blood pressure Sister    Heart disease Sister    Cancer Sister    Heart disease Brother    Diabetes Brother    Brain cancer Brother    High blood pressure Brother    Heart disease Brother    Heart attack Brother    Heart disease Brother    High blood pressure Brother     Past Surgical History:  Procedure Laterality Date   ABDOMINAL HYSTERECTOMY  1970's   complete   ANKLE ARTHROSCOPY Right 07/10/2017   Procedure: ANKLE ARTHROSCOPY;  Surgeon: [name], DPM;  Location: [place];  Service: Podiatry;  Laterality: Right;   BACK SURGERY  07/26/2016   cervical neck surgery, [facility]   BALLOON DILATION N/A 12/01/2021   Procedure: BALLOON DILATION;  Surgeon: Carol Ada, MD;  Location: [facility];  Service: Gastroenterology;  Laterality: N/A;   BLADDER SURGERY  2010   WITH MESH  bladder tach   bunion removal surgery Bilateral 15 yrs ago   CARDIAC CATHETERIZATION Left 09/25/2011   Medical management   CARDIAC CATHETERIZATION Left 03/25/2001   Normal LV function, LAD residual narrowing of less than 10%, normal ramus intermediate, circumflex, and RCA,    CARDIAC CATHETERIZATION  09/04/1999   LAD, 3x10m Tetra stent resulting in a reduction of the 80% stenosis to 0% residual   CARDIAC CATHETERIZATION N/A 01/26/2015   Procedure: Right/Left Heart Cath and Coronary Angiography;  Surgeon: TTroy Sine MD;  [place];  Service: Cardiovascular;  Laterality: N/A;   CARDIAC CATHETERIZATION N/A 01/27/2015   Procedure: Intravascular Pressure Wire/FFR Study;  Surgeon: CBurnell Blanks MD;  [facility];  Service: Cardiovascular;  Laterality: N/A;   CARDIAC CATHETERIZATION N/A 01/27/2015   Procedure: Right Heart Cath;  Surgeon: CBurnell Blanks MD;  [facility];  Service: Cardiovascular;  Laterality: N/A;   CHOLECYSTECTOMY     COLONOSCOPY WITH PROPOFOL N/A 03/07/2017   Procedure:  COLONOSCOPY WITH PROPOFOL;  Surgeon: MJuanita Craver MD;  Location: [facility];  Service: Endoscopy;  Laterality: N/A;   CORONARY STENT PLACEMENT     LAD x 1   ESOPHAGOGASTRODUODENOSCOPY (EGD) WITH PROPOFOL N/A 12/01/2021   Procedure: ESOPHAGOGASTRODUODENOSCOPY (EGD) WITH PROPOFOL;  Surgeon: HCarol Ada MD;  Location: [facility];  Service: Gastroenterology;  Laterality: N/A;   ganglion cyst removal  yrs ago   x  2   ILIAC VEIN ANGIOPLASTY / STENTING  02/15/2015   INTRAVASCULAR PRESSURE WIRE/FFR STUDY N/A 04/05/2017   Procedure: Intravascular Pressure Wire/FFR Study;  Surgeon: Martinique, Peter M, MD;  Location: [place] [facility];  Service: Cardiovascular;  Laterality: N/A;   IVC FILTER INSERTION  2016   IVC FILTER REMOVAL  02/15/2015   at [place] CATH AND CORONARY ANGIOGRAPHY N/A 04/05/2017   Procedure: Left Heart Cath and Coronary Angiography;  Surgeon: Martinique, Peter M, MD;  Location: [place] [facility];  Service: Cardiovascular;  Laterality: N/A;   LEFT HEART CATHETERIZATION WITH CORONARY ANGIOGRAM N/A 09/25/2011   Procedure: LEFT HEART CATHETERIZATION WITH CORONARY ANGIOGRAM;  Surgeon: Sanda Klein, MD;  Location: [place] [facility];  Service: Cardiovascular;  Laterality: N/A;   multiple bladder surgeries to remove mesh     ROTATOR CUFF REPAIR Right    stent to groin Left 08/2014   left leg   TENDON REPAIR Right 07/10/2017   Procedure: RIGHT PERONEAL TENDON REPAIR;  Surgeon: [name], DPM;  Location: [place];  Service: Podiatry;  Laterality: Right;   TENDON REPAIR Right 05/27/2019   Procedure: PERONEAL TENDON REPAIR x2;  Surgeon: [name], DPM;  Location: [facility];  Service: Podiatry;  Laterality: Right;   Social History   Occupational History   Not on file  Tobacco Use   Smoking status: Never   Smokeless tobacco: Never  Vaping Use   Vaping Use: Never used  Substance and Sexual Activity   Alcohol use: No   Drug use: No   Sexual activity: Not on file

## 2022-11-30 DIAGNOSIS — E039 Hypothyroidism, unspecified: Secondary | ICD-10-CM | POA: Diagnosis not present

## 2022-11-30 DIAGNOSIS — I2510 Atherosclerotic heart disease of native coronary artery without angina pectoris: Secondary | ICD-10-CM | POA: Diagnosis not present

## 2022-11-30 DIAGNOSIS — I10 Essential (primary) hypertension: Secondary | ICD-10-CM | POA: Diagnosis not present

## 2022-11-30 DIAGNOSIS — S060XAD Concussion with loss of consciousness status unknown, subsequent encounter: Secondary | ICD-10-CM | POA: Diagnosis not present

## 2022-12-02 DIAGNOSIS — Z8701 Personal history of pneumonia (recurrent): Secondary | ICD-10-CM | POA: Diagnosis not present

## 2022-12-02 DIAGNOSIS — Z79899 Other long term (current) drug therapy: Secondary | ICD-10-CM | POA: Diagnosis not present

## 2022-12-02 DIAGNOSIS — I872 Venous insufficiency (chronic) (peripheral): Secondary | ICD-10-CM | POA: Diagnosis not present

## 2022-12-02 DIAGNOSIS — I495 Sick sinus syndrome: Secondary | ICD-10-CM | POA: Diagnosis not present

## 2022-12-02 DIAGNOSIS — S52512D Displaced fracture of left radial styloid process, subsequent encounter for closed fracture with routine healing: Secondary | ICD-10-CM | POA: Diagnosis not present

## 2022-12-02 DIAGNOSIS — N183 Chronic kidney disease, stage 3 unspecified: Secondary | ICD-10-CM | POA: Diagnosis not present

## 2022-12-02 DIAGNOSIS — J45909 Unspecified asthma, uncomplicated: Secondary | ICD-10-CM | POA: Diagnosis not present

## 2022-12-02 DIAGNOSIS — E785 Hyperlipidemia, unspecified: Secondary | ICD-10-CM | POA: Diagnosis not present

## 2022-12-02 DIAGNOSIS — E039 Hypothyroidism, unspecified: Secondary | ICD-10-CM | POA: Diagnosis not present

## 2022-12-02 DIAGNOSIS — E669 Obesity, unspecified: Secondary | ICD-10-CM | POA: Diagnosis not present

## 2022-12-02 DIAGNOSIS — M4856XD Collapsed vertebra, not elsewhere classified, lumbar region, subsequent encounter for fracture with routine healing: Secondary | ICD-10-CM | POA: Diagnosis not present

## 2022-12-02 DIAGNOSIS — S42192D Fracture of other part of scapula, left shoulder, subsequent encounter for fracture with routine healing: Secondary | ICD-10-CM | POA: Diagnosis not present

## 2022-12-02 DIAGNOSIS — M1711 Unilateral primary osteoarthritis, right knee: Secondary | ICD-10-CM | POA: Diagnosis not present

## 2022-12-02 DIAGNOSIS — Z8673 Personal history of transient ischemic attack (TIA), and cerebral infarction without residual deficits: Secondary | ICD-10-CM | POA: Diagnosis not present

## 2022-12-02 DIAGNOSIS — I2510 Atherosclerotic heart disease of native coronary artery without angina pectoris: Secondary | ICD-10-CM | POA: Diagnosis not present

## 2022-12-02 DIAGNOSIS — I739 Peripheral vascular disease, unspecified: Secondary | ICD-10-CM | POA: Diagnosis not present

## 2022-12-02 DIAGNOSIS — S0101XD Laceration without foreign body of scalp, subsequent encounter: Secondary | ICD-10-CM | POA: Diagnosis not present

## 2022-12-02 DIAGNOSIS — I130 Hypertensive heart and chronic kidney disease with heart failure and stage 1 through stage 4 chronic kidney disease, or unspecified chronic kidney disease: Secondary | ICD-10-CM | POA: Diagnosis not present

## 2022-12-02 DIAGNOSIS — M6250 Muscle wasting and atrophy, not elsewhere classified, unspecified site: Secondary | ICD-10-CM | POA: Diagnosis not present

## 2022-12-02 DIAGNOSIS — M47812 Spondylosis without myelopathy or radiculopathy, cervical region: Secondary | ICD-10-CM | POA: Diagnosis not present

## 2022-12-02 DIAGNOSIS — G4710 Hypersomnia, unspecified: Secondary | ICD-10-CM | POA: Diagnosis not present

## 2022-12-02 DIAGNOSIS — Z85828 Personal history of other malignant neoplasm of skin: Secondary | ICD-10-CM | POA: Diagnosis not present

## 2022-12-02 DIAGNOSIS — D696 Thrombocytopenia, unspecified: Secondary | ICD-10-CM | POA: Diagnosis not present

## 2022-12-02 DIAGNOSIS — S060XAD Concussion with loss of consciousness status unknown, subsequent encounter: Secondary | ICD-10-CM | POA: Diagnosis not present

## 2022-12-02 DIAGNOSIS — Z86718 Personal history of other venous thrombosis and embolism: Secondary | ICD-10-CM | POA: Diagnosis not present

## 2022-12-02 DIAGNOSIS — I5032 Chronic diastolic (congestive) heart failure: Secondary | ICD-10-CM | POA: Diagnosis not present

## 2022-12-02 DIAGNOSIS — Z556 Problems related to health literacy: Secondary | ICD-10-CM | POA: Diagnosis not present

## 2022-12-03 DIAGNOSIS — S52515D Nondisplaced fracture of left radial styloid process, subsequent encounter for closed fracture with routine healing: Secondary | ICD-10-CM | POA: Diagnosis not present

## 2022-12-03 DIAGNOSIS — S098XXD Other specified injuries of head, subsequent encounter: Secondary | ICD-10-CM | POA: Diagnosis not present

## 2022-12-05 ENCOUNTER — Ambulatory Visit
Admission: RE | Admit: 2022-12-05 | Discharge: 2022-12-05 | Disposition: A | Discharge status: Home / Self Care | Payer: PPO | Source: Ambulatory Visit | Attending: Adult Health | Admitting: Adult Health

## 2022-12-05 DIAGNOSIS — W19XXXS Unspecified fall, sequela: Secondary | ICD-10-CM

## 2022-12-05 DIAGNOSIS — R519 Headache, unspecified: Secondary | ICD-10-CM | POA: Diagnosis not present

## 2022-12-07 DIAGNOSIS — M6250 Muscle wasting and atrophy, not elsewhere classified, unspecified site: Secondary | ICD-10-CM | POA: Diagnosis not present

## 2022-12-07 DIAGNOSIS — Z8701 Personal history of pneumonia (recurrent): Secondary | ICD-10-CM | POA: Diagnosis not present

## 2022-12-07 DIAGNOSIS — E785 Hyperlipidemia, unspecified: Secondary | ICD-10-CM | POA: Diagnosis not present

## 2022-12-07 DIAGNOSIS — Z8673 Personal history of transient ischemic attack (TIA), and cerebral infarction without residual deficits: Secondary | ICD-10-CM | POA: Diagnosis not present

## 2022-12-07 DIAGNOSIS — M1711 Unilateral primary osteoarthritis, right knee: Secondary | ICD-10-CM | POA: Diagnosis not present

## 2022-12-07 DIAGNOSIS — N183 Chronic kidney disease, stage 3 unspecified: Secondary | ICD-10-CM | POA: Diagnosis not present

## 2022-12-07 DIAGNOSIS — I872 Venous insufficiency (chronic) (peripheral): Secondary | ICD-10-CM | POA: Diagnosis not present

## 2022-12-07 DIAGNOSIS — S42192D Fracture of other part of scapula, left shoulder, subsequent encounter for fracture with routine healing: Secondary | ICD-10-CM | POA: Diagnosis not present

## 2022-12-07 DIAGNOSIS — Z85828 Personal history of other malignant neoplasm of skin: Secondary | ICD-10-CM | POA: Diagnosis not present

## 2022-12-07 DIAGNOSIS — M4856XD Collapsed vertebra, not elsewhere classified, lumbar region, subsequent encounter for fracture with routine healing: Secondary | ICD-10-CM | POA: Diagnosis not present

## 2022-12-07 DIAGNOSIS — I2510 Atherosclerotic heart disease of native coronary artery without angina pectoris: Secondary | ICD-10-CM | POA: Diagnosis not present

## 2022-12-07 DIAGNOSIS — S060XAD Concussion with loss of consciousness status unknown, subsequent encounter: Secondary | ICD-10-CM | POA: Diagnosis not present

## 2022-12-07 DIAGNOSIS — E039 Hypothyroidism, unspecified: Secondary | ICD-10-CM | POA: Diagnosis not present

## 2022-12-07 DIAGNOSIS — Z79899 Other long term (current) drug therapy: Secondary | ICD-10-CM | POA: Diagnosis not present

## 2022-12-07 DIAGNOSIS — D696 Thrombocytopenia, unspecified: Secondary | ICD-10-CM | POA: Diagnosis not present

## 2022-12-07 DIAGNOSIS — I739 Peripheral vascular disease, unspecified: Secondary | ICD-10-CM | POA: Diagnosis not present

## 2022-12-07 DIAGNOSIS — Z86718 Personal history of other venous thrombosis and embolism: Secondary | ICD-10-CM | POA: Diagnosis not present

## 2022-12-07 DIAGNOSIS — I5032 Chronic diastolic (congestive) heart failure: Secondary | ICD-10-CM | POA: Diagnosis not present

## 2022-12-07 DIAGNOSIS — S52512D Displaced fracture of left radial styloid process, subsequent encounter for closed fracture with routine healing: Secondary | ICD-10-CM | POA: Diagnosis not present

## 2022-12-07 DIAGNOSIS — I130 Hypertensive heart and chronic kidney disease with heart failure and stage 1 through stage 4 chronic kidney disease, or unspecified chronic kidney disease: Secondary | ICD-10-CM | POA: Diagnosis not present

## 2022-12-07 DIAGNOSIS — I495 Sick sinus syndrome: Secondary | ICD-10-CM | POA: Diagnosis not present

## 2022-12-07 DIAGNOSIS — Z556 Problems related to health literacy: Secondary | ICD-10-CM | POA: Diagnosis not present

## 2022-12-07 DIAGNOSIS — E669 Obesity, unspecified: Secondary | ICD-10-CM | POA: Diagnosis not present

## 2022-12-07 DIAGNOSIS — S0101XD Laceration without foreign body of scalp, subsequent encounter: Secondary | ICD-10-CM | POA: Diagnosis not present

## 2022-12-10 ENCOUNTER — Telehealth: Payer: Self-pay

## 2022-12-10 NOTE — Telephone Encounter (Signed)
Patient would like results from MRI done 12/05/22. Please send results to [facility] clinical pool.  Message routed to Durenda Age, NP

## 2022-12-11 ENCOUNTER — Ambulatory Visit (INDEPENDENT_AMBULATORY_CARE_PROVIDER_SITE_OTHER): Payer: PPO | Admitting: Adult Health

## 2022-12-11 ENCOUNTER — Ambulatory Visit: Payer: HMO | Admitting: Family

## 2022-12-11 ENCOUNTER — Encounter: Payer: Self-pay | Admitting: Family

## 2022-12-11 VITALS — BP 118/58 | HR 76 | Temp 98.0°F | Resp 16 | Ht 65.5 in | Wt 175.8 lb

## 2022-12-11 DIAGNOSIS — I25118 Atherosclerotic heart disease of native coronary artery with other forms of angina pectoris: Secondary | ICD-10-CM

## 2022-12-11 DIAGNOSIS — S0990XS Unspecified injury of head, sequela: Secondary | ICD-10-CM

## 2022-12-11 DIAGNOSIS — M1712 Unilateral primary osteoarthritis, left knee: Secondary | ICD-10-CM

## 2022-12-11 DIAGNOSIS — E785 Hyperlipidemia, unspecified: Secondary | ICD-10-CM | POA: Diagnosis not present

## 2022-12-11 DIAGNOSIS — I5032 Chronic diastolic (congestive) heart failure: Secondary | ICD-10-CM | POA: Diagnosis not present

## 2022-12-11 DIAGNOSIS — E039 Hypothyroidism, unspecified: Secondary | ICD-10-CM

## 2022-12-11 DIAGNOSIS — Z9181 History of falling: Secondary | ICD-10-CM | POA: Diagnosis not present

## 2022-12-11 DIAGNOSIS — S42102S Fracture of unspecified part of scapula, left shoulder, sequela: Secondary | ICD-10-CM | POA: Diagnosis not present

## 2022-12-11 DIAGNOSIS — S62102S Fracture of unspecified carpal bone, left wrist, sequela: Secondary | ICD-10-CM

## 2022-12-11 DIAGNOSIS — S060X0S Concussion without loss of consciousness, sequela: Secondary | ICD-10-CM

## 2022-12-11 NOTE — Progress Notes (Addendum)
[facility]  Provider:  Durenda Age DNP  Code Status:  DNR  Goals of Care:     11/13/2022    1:26 AM  Advanced Directives  Would patient like information on creating a medical advance directive? No - Patient declined     Chief Complaint  Patient presents with   REHAB     She is doing better since rehab. She states that her head is something else. She has had staples in her head removed. She is asking about her last couple xrays and Ct scans. Worried because she has the swimmy head a lot and is scared her hurt her brain.    HPI: Patient is a 89 y.o. female seen today for an acute visit for rehab follow up. She was hospitalized 11/12/22 to 11/17/22  for head injury S/P fall. She fell at home while going down the stairs. She said that her left knee "gave out". She sustained a concussion and scalp laceration which was stapled and. CT head showed no acute intracranial abnormality and a large posterior scalp hematoma. She, also, has a left radial styloid fracture and a left inferomedial scapular wing fracture. She was placed on a shoulder sling and wrist splint and nonweightbearing  and weight bearing as tolerated on the elbow.  She was discharged to [facility] for short-term rehabilitation. She was discharged home on 12/01/22.  She was accompanied today by an aide. Patient stated that a [place] PT comes to the house 2X/week.    Past Medical History:  Diagnosis Date   Arthritis    right knee   Cancer ([place]) yrs ago   skin cancer removed from face   Chronic diastolic CHF (congestive heart failure) (HCC)    Chronic venous insufficiency    LEA VENOUS, 10/17/2011 - mild reflux in bilateral common femoral veins   CKD (chronic kidney disease), stage III ([place])    Coronary artery disease    a. s/p PCI/BMS to prox LAD and balloon angioplasty to mLAD with suboptimal result in 2000. b. Abnl nuc 2012, cath 09/2011 - showed that the overall territory of potential ischemia was small and  attributable to a moderately diseased small second diagonal artery. Med rx.   Dyspnea    with activity   Headache    History of blood transfusion 2017   Hyperlipidemia    Hypertension    Hypertensive heart disease    Hypothyroidism    Obesity    Pneumonia    several times   Stroke [facility])    pt. states she had "light stroke" in sept. 1980   Urinary incontinence     Past Surgical History:  Procedure Laterality Date   ABDOMINAL HYSTERECTOMY  1970's   complete   ANKLE ARTHROSCOPY Right 07/10/2017   Procedure: ANKLE ARTHROSCOPY;  Surgeon: [name], DPM;  Location: [place];  Service: Podiatry;  Laterality: Right;   BACK SURGERY  07/26/2016   cervical neck surgery, [place] [facility]   BALLOON DILATION N/A 12/01/2021   Procedure: BALLOON DILATION;  Surgeon: Carol Ada, MD;  [facility]: [facility];  Service: Gastroenterology;  Laterality: N/A;   BLADDER SURGERY  2010   WITH MESH  bladder tach   bunion removal surgery Bilateral 15 yrs ago   CARDIAC CATHETERIZATION Left 09/25/2011   Medical management   CARDIAC CATHETERIZATION Left 03/25/2001   Normal LV function, LAD residual narrowing of less than 10%, normal ramus intermediate, circumflex, and RCA,    CARDIAC CATHETERIZATION  09/04/1999  LAD, 3x35mm Tetra stent resulting in a reduction of the 80% stenosis to 0% residual   CARDIAC CATHETERIZATION N/A 01/26/2015   Procedure: Right/Left Heart Cath and Coronary Angiography;  Surgeon: Troy Sine, MD;  Location: [place] CV LAB;  Service: Cardiovascular;  Laterality: N/A;   CARDIAC CATHETERIZATION N/A 01/27/2015   Procedure: Intravascular Pressure Wire/FFR Study;  Surgeon: Burnell Blanks, MD;  Location: [place] CV [facility];  Service: Cardiovascular;  Laterality: N/A;   CARDIAC CATHETERIZATION N/A 01/27/2015   Procedure: Right Heart Cath;  Surgeon: Burnell Blanks, MD;  Location: [place] [facility];  Service: Cardiovascular;  Laterality: N/A;   CHOLECYSTECTOMY      COLONOSCOPY WITH PROPOFOL N/A 03/07/2017   Procedure: COLONOSCOPY WITH PROPOFOL;  Surgeon: Juanita Craver, MD;  [facility];  Service: Endoscopy;  Laterality: N/A;   CORONARY STENT PLACEMENT     LAD x 1   ESOPHAGOGASTRODUODENOSCOPY (EGD) WITH PROPOFOL N/A 12/01/2021   Procedure: ESOPHAGOGASTRODUODENOSCOPY (EGD) WITH PROPOFOL;  Surgeon: Carol Ada, MD;  [facility];  Service: Gastroenterology;  Laterality: N/A;   ganglion cyst removal  yrs ago   x 2   ILIAC VEIN ANGIOPLASTY / STENTING  02/15/2015   INTRAVASCULAR PRESSURE WIRE/FFR STUDY N/A 04/05/2017   Procedure: Intravascular Pressure Wire/FFR Study;  Surgeon: Martinique, Peter M, MD;  Location: [place] CV [facility];  Service: Cardiovascular;  Laterality: N/A;   IVC FILTER INSERTION  2016   IVC FILTER REMOVAL  02/15/2015   at [place] CATH AND CORONARY ANGIOGRAPHY N/A 04/05/2017   Procedure: Left Heart Cath and Coronary Angiography;  Surgeon: Martinique, Peter M, MD;  Location: [place] [facility];  Service: Cardiovascular;  Laterality: N/A;   LEFT HEART CATHETERIZATION WITH CORONARY ANGIOGRAM N/A 09/25/2011   Procedure: LEFT HEART CATHETERIZATION WITH CORONARY ANGIOGRAM;  Surgeon: Sanda Klein, MD;  Location: [place] C[facility];  Service: Cardiovascular;  Laterality: N/A;   multiple bladder surgeries to remove mesh     ROTATOR CUFF REPAIR Right    stent to groin Left 08/2014   left leg   TENDON REPAIR Right 07/10/2017   Procedure: RIGHT PERONEAL TENDON REPAIR;  Surgeon: [name], DPM;  Location: [place];  Service: Podiatry;  Laterality: Right;   TENDON REPAIR Right 05/27/2019   Procedure: PERONEAL TENDON REPAIR x2;  Surgeon: [name], DPM;  [facility];  Service: Podiatry;  Laterality: Right;    Allergies  Allergen Reactions   Atorvastatin Anaphylaxis and Other (See Comments)    Patient does not remember; caused pneumonia- took her off of it per patient   Penicillins Anaphylaxis, Hives, Swelling and  Other (See Comments)    Tongue swelling Did it involve swelling of the face/tongue/throat, SOB, or low BP? Yes Did it involve sudden or severe rash/hives, skin peeling, or any reaction on the inside of your mouth or nose? Yes Did you need to seek medical attention at a hospital or doctor's office? Yes When did it last happen?  38 or 89 years old    If all above answers are "NO", may proceed with cephalosporin use.    Shellfish Allergy Anaphylaxis, Nausea And Vomiting and Other (See Comments)    Severe nausea and vomiting   Aminophylline Itching, Swelling, Rash and Other (See Comments)    Pt experienced burning urination, itching, redness/rash, and swelling of genitals after given this medication via IV push.   Iodinated Contrast Media Swelling and Other (See Comments)    FLUSHING, also; LLE  became swollen   Latex Other (See Comments)    Causes blisters   Oxybutynin Chloride Other (See Comments)    "blisters"   Propoxyphene Other (See Comments)    Unknown reaction   Vancomycin Hives and Other (See Comments)    06/02/2018- Hives arm, back and chest   Adhesive [Tape] Rash and Other (See Comments)    Paper tape is ok   Betadine [Povidone Iodine] Rash   Ciprofloxacin Hives   Codeine Nausea And Vomiting    .   Ranolazine Other (See Comments)    Constipation  .    Outpatient Encounter Medications as of 12/11/2022  Medication Sig   cholecalciferol (VITAMIN D3) 25 MCG (1000 UNIT) tablet Take 1,000 Units by mouth daily.    ezetimibe (ZETIA) 10 MG tablet TAKE ONE TABLET BY MOUTH EVERY MORNING   isosorbide mononitrate (IMDUR) 30 MG 24 hr tablet Take 1 tablet (30 mg total) by mouth 2 (two) times daily.   ketoconazole (NIZORAL) 2 % shampoo Apply 1 application  topically daily as needed for irritation (or itching- SHAMPOO).   KLOR-CON M20 20 MEQ tablet Take 1 tablet (20 mEq total) by mouth 2 (two) times daily.   latanoprost (XALATAN) 0.005 % ophthalmic solution Place 1 drop into both eyes at  bedtime.   levothyroxine (SYNTHROID) 88 MCG tablet TAKE ONE TABLET BY MOUTH BEFORE BREAKFAST (Patient taking differently: Take 88 mcg by mouth daily before breakfast.)   OVER THE COUNTER MEDICATION Take 1 capsule by mouth daily. OmegaXL joint and muscle support   pravastatin (PRAVACHOL) 80 MG tablet Take 80 mg by mouth at bedtime.   sertraline (ZOLOFT) 25 MG tablet TAKE ONE TABLET BY MOUTH ONCE DAILY   torsemide (DEMADEX) 100 MG tablet 195 lbs OR less ONE-HALF tab IN THE MORNING & IN THE EVENING. 196lbs OR higher ONE tab IN THE MORNING & ONE-HALF tab IN THE EVENING   TYLENOL 8 HOUR ARTHRITIS PAIN 650 MG CR tablet Take 650 mg by mouth every 8 (eight) hours as needed for pain.   vitamin C (ASCORBIC ACID) 500 MG tablet Take 500 mg by mouth daily.   No facility-administered encounter medications on file as of 12/11/2022.    Review of Systems:  Review of Systems  Constitutional:  Negative for appetite change, chills, fatigue and fever.  HENT:  Negative for congestion, hearing loss, rhinorrhea and sore throat.   Eyes: Negative.   Respiratory:  Negative for cough, shortness of breath and wheezing.   Cardiovascular:  Negative for chest pain, palpitations and leg swelling.  Gastrointestinal:  Negative for abdominal pain, constipation, diarrhea, nausea and vomiting.  Genitourinary:  Negative for dysuria.  Musculoskeletal:  Negative for arthralgias, back pain and myalgias.  Skin:  Negative for color change, rash and wound.  Neurological:  Negative for dizziness, weakness and headaches.  Psychiatric/Behavioral:  Negative for behavioral problems. The patient is not nervous/anxious.     Health Maintenance  Topic Date Due   Medicare Annual Wellness (AWV)  06/22/2023   DTaP/Tdap/Td (3 - Td or Tdap) 11/12/2032   Pneumonia Vaccine 63+ Years old  Completed   DEXA SCAN  Completed   HPV VACCINES  Aged Out   INFLUENZA VACCINE  Discontinued   COVID-19 Vaccine  Discontinued   Zoster Vaccines- Shingrix   Discontinued    Physical Exam: Vitals:   12/11/22 1329  BP: (!) 118/58  Pulse: 76  Resp: 16  Temp: 98 F (36.7 C)  TempSrc: Temporal  SpO2: 95%  Weight: 175 lb  12.8 oz (79.7 kg)  Height: 5' 5.5" (1.664 m)   Body mass index is 28.81 kg/m. Physical Exam Constitutional:      Appearance: Normal appearance.  HENT:     Head: Normocephalic and atraumatic.     Nose: Nose normal.     Mouth/Throat:     Mouth: Mucous membranes are moist.  Eyes:     Conjunctiva/sclera: Conjunctivae normal.  Cardiovascular:     Rate and Rhythm: Normal rate and regular rhythm.  Pulmonary:     Effort: Pulmonary effort is normal.     Breath sounds: Normal breath sounds.  Abdominal:     General: Bowel sounds are normal.     Palpations: Abdomen is soft.  Musculoskeletal:        General: Normal range of motion.     Cervical back: Normal range of motion.     Comments: Left wrist with brace  Skin:    General: Skin is warm and dry.  Neurological:     General: No focal deficit present.     Mental Status: She is alert and oriented to person, place, and time.  Psychiatric:        Mood and Affect: Mood normal.        Behavior: Behavior normal.        Thought Content: Thought content normal.        Judgment: Judgment normal.     Labs reviewed: Basic Metabolic Panel: Recent Labs    03/07/22 1014 03/08/22 1416 03/09/22 0439 09/04/22 0938 11/12/22 1915 11/13/22 0624 11/15/22 0453  NA 140 137   < > 142 141 139 137  K 2.5* 2.2*   < > 3.4* 3.5 3.3* 3.8  CL 90* 89*   < > 101 105 102 100  CO2 35* 37*   < > 33* 24 29 30   GLUCOSE 100* 111*   < > 101* 149* 136* 98  BUN 43* 40*   < > 18 18 15 15   CREATININE 1.15* 1.17*   < > 0.82 0.75 0.76 0.74  CALCIUM 9.9 10.4*   < > 9.5 8.7* 9.4 8.5*  MG  --  2.1  --   --   --   --  2.0  TSH 1.02  --   --  0.08*  --   --   --    < > = values in this interval not displayed.   Liver Function Tests: Recent Labs    03/09/22 0439 09/04/22 0938 11/12/22 1915  11/13/22 0624  AST 42* 32 40 30  ALT 27 15 17 16   ALKPHOS 57  --  79 71  BILITOT 1.1 1.4* 1.2 1.2  PROT 5.7* 6.2 5.6* 5.7*  ALBUMIN 2.9*  --  2.9* 2.8*   No results for input(s): "LIPASE", "AMYLASE" in the last 8760 hours. No results for input(s): "AMMONIA" in the last 8760 hours. CBC: Recent Labs    09/04/22 0938 11/12/22 1915 11/13/22 0624 11/15/22 0453  WBC 4.2 7.4 5.9 5.7  NEUTROABS 2,268 6.2  --  3.6  HGB 14.8 13.4 13.0 12.4  HCT 43.4 41.4 39.5 37.8  MCV 97.5 102.5* 100.5* 100.5*  PLT 117* 79* 73* 78*   Lipid Panel: Recent Labs    03/07/22 1014 09/04/22 0938  CHOL 139 162  HDL 71 52  LDLCALC 52 87  TRIG 82 131  CHOLHDL 2.0 3.1   Lab Results  Component Value Date   HGBA1C 6.1 (H) 01/26/2015    Procedures since last visit:  CT HEAD WO CONTRAST (5MM)  Result Date: 12/05/2022 CLINICAL DATA:  Headache history of recent fall EXAM: CT HEAD WITHOUT CONTRAST TECHNIQUE: Contiguous axial images were obtained from the base of the skull through the vertex without intravenous contrast. RADIATION DOSE REDUCTION: This exam was performed according to the departmental dose-optimization program which includes automated exposure control, adjustment of the mA and/or kV according to patient size and/or use of iterative reconstruction technique. COMPARISON:  CT 11/12/2022 FINDINGS: Brain: No acute territorial infarction, hemorrhage or intracranial mass. Mild atrophy. Patchy white matter hypodensity consistent with chronic small vessel ischemic change. Ventricles are nonenlarged. Vascular: No hyperdense vessels.  Carotid vascular calcification Skull: Normal. Negative for fracture or focal lesion. Sinuses/Orbits: No acute finding. Other: None IMPRESSION: 1. No CT evidence for acute intracranial abnormality. 2. Atrophy and chronic small vessel ischemic changes of the white matter. Electronically Signed   By: Donavan Foil M.D.   On: 12/05/2022 16:35   XR KNEE 3 VIEW LEFT  Result Date:  11/29/2022 AP, lateral, sunrise views of left knee reviewed.  No fracture or dislocation noted.  Moderate degenerative changes of the medial compartment with mild degenerative changes of the lateral and patellofemoral compartments.  CT CHEST ABDOMEN PELVIS WO CONTRAST  Result Date: 11/12/2022 CLINICAL DATA:  Blunt trauma. EXAM: CT CHEST, ABDOMEN AND PELVIS WITHOUT CONTRAST TECHNIQUE: Multidetector CT imaging of the chest, abdomen and pelvis was performed following the standard protocol without IV contrast. RADIATION DOSE REDUCTION: This exam was performed according to the departmental dose-optimization program which includes automated exposure control, adjustment of the mA and/or kV according to patient size and/or use of iterative reconstruction technique. COMPARISON:  CT abdomen pelvis dated 10/22/2019. FINDINGS: Evaluation of this exam is limited in the absence of intravenous contrast. CT CHEST FINDINGS Cardiovascular: There is no cardiomegaly or pericardial effusion. There is 3 vessel coronary vascular calcification. Moderate atherosclerotic calcification of the thoracic aorta. No aneurysmal dilatation. Mild dilatation of the main pulmonary trunk suggestive of pulmonary hypertension. Clinical correlation is recommended. Mediastinum/Nodes: No hilar or mediastinal adenopathy. The esophagus is grossly unremarkable. No mediastinal fluid collection. Lungs/Pleura: There are bibasilar linear atelectasis/scarring. No focal consolidation, pleural effusion, or pneumothorax. The central airways are patent. Musculoskeletal: Fracture of the inferior medial left scapular wing. Osteopenia with degenerative changes of the spine. Mild L3 compression fracture, age indeterminate. Correlation with clinical exam and point tenderness recommended. No retropulsion. CT ABDOMEN PELVIS FINDINGS No intra-abdominal free air.  Small perihepatic ascites. Hepatobiliary: Cirrhosis. No biliary ductal dilatation. Cholecystectomy. Pancreas:  Unremarkable. No pancreatic ductal dilatation or surrounding inflammatory changes. Spleen: Mild splenomegaly measuring 14 cm in length. Adrenals/Urinary Tract: The adrenal glands are unremarkable. There is a 3 mm nonobstructing left renal interpolar calculus. No hydronephrosis. The right kidney is unremarkable. The visualized ureters and urinary bladder appear unremarkable. Stomach/Bowel: There is no bowel obstruction or active inflammation. The appendix is normal. Vascular/Lymphatic: Moderate aortoiliac atherosclerotic disease. Left common iliac vein stent noted. Evaluation of the vessels is limited in the absence of intravenous contrast. No portal venous gas. There is no adenopathy. Reproductive: Hysterectomy.  No adnexal masses. Other: None Musculoskeletal: Osteopenia with degenerative changes of the spine. L4-L5 disc spacer and posterior fusion. No acute osseous pathology. IMPRESSION: 1. Fracture of the inferio-medial left scapular wing. 2. Mild L3 compression fracture, age indeterminate. Correlation with clinical exam and point tenderness recommended. No retropulsion. 3. Cirrhosis with small perihepatic ascites and mild splenomegaly. 4. A 3 mm nonobstructing left renal interpolar calculus. No hydronephrosis. 5.  Aortic Atherosclerosis (  ICD10-I70.0). Electronically Signed   By: Anner Crete M.D.   On: 11/12/2022 21:42   CT Head Wo Contrast  Result Date: 11/12/2022 CLINICAL DATA:  Head and neck trauma. Fall down steps landing on left side. Hematoma to back of head. EXAM: CT HEAD WITHOUT CONTRAST CT CERVICAL SPINE WITHOUT CONTRAST TECHNIQUE: Multidetector CT imaging of the head and cervical spine was performed following the standard protocol without intravenous contrast. Multiplanar CT image reconstructions of the cervical spine were also generated. RADIATION DOSE REDUCTION: This exam was performed according to the departmental dose-optimization program which includes automated exposure control, adjustment  of the mA and/or kV according to patient size and/or use of iterative reconstruction technique. COMPARISON:  CT head and cervical spine 06/20/2021. FINDINGS: CT HEAD FINDINGS Brain: No acute hemorrhage. Unchanged chronic small-vessel disease. Cortical gray-white differentiation is otherwise preserved. Prominence of the ventricles and sulci within normal limits for age. No extra-axial collection. Basilar cisterns are patent. Vascular: No hyperdense vessel or unexpected calcification. Skull: No calvarial fracture or suspicious bone lesion. Skull base is unremarkable. Sinuses/Orbits: Unremarkable. Other: Large posterior scalp hematoma. CT CERVICAL SPINE FINDINGS Alignment: Normal. Skull base and vertebrae: Postoperative changes of C5-C7 ACDF. Intact hardware. No hardware associated lucency or fracture. Normal craniocervical junction. No acute cervical spine fracture. Soft tissues and spinal canal: No prevertebral fluid or swelling. No visible canal hematoma. Disc levels:  No high-grade spinal canal stenosis. Upper chest: Unremarkable. Other: Atherosclerotic calcifications of the carotid bulbs. IMPRESSION: 1. No acute intracranial abnormality. Large posterior scalp hematoma without underlying calvarial fracture. 2. No acute cervical spine fracture or traumatic listhesis. 3. Postoperative changes of C5-C7 ACDF. Intact hardware. Electronically Signed   By: Emmit Alexanders M.D.   On: 11/12/2022 17:05   CT Cervical Spine Wo Contrast  Result Date: 11/12/2022 CLINICAL DATA:  Head and neck trauma. Fall down steps landing on left side. Hematoma to back of head. EXAM: CT HEAD WITHOUT CONTRAST CT CERVICAL SPINE WITHOUT CONTRAST TECHNIQUE: Multidetector CT imaging of the head and cervical spine was performed following the standard protocol without intravenous contrast. Multiplanar CT image reconstructions of the cervical spine were also generated. RADIATION DOSE REDUCTION: This exam was performed according to the departmental  dose-optimization program which includes automated exposure control, adjustment of the mA and/or kV according to patient size and/or use of iterative reconstruction technique. COMPARISON:  CT head and cervical spine 06/20/2021. FINDINGS: CT HEAD FINDINGS Brain: No acute hemorrhage. Unchanged chronic small-vessel disease. Cortical gray-white differentiation is otherwise preserved. Prominence of the ventricles and sulci within normal limits for age. No extra-axial collection. Basilar cisterns are patent. Vascular: No hyperdense vessel or unexpected calcification. Skull: No calvarial fracture or suspicious bone lesion. Skull base is unremarkable. Sinuses/Orbits: Unremarkable. Other: Large posterior scalp hematoma. CT CERVICAL SPINE FINDINGS Alignment: Normal. Skull base and vertebrae: Postoperative changes of C5-C7 ACDF. Intact hardware. No hardware associated lucency or fracture. Normal craniocervical junction. No acute cervical spine fracture. Soft tissues and spinal canal: No prevertebral fluid or swelling. No visible canal hematoma. Disc levels:  No high-grade spinal canal stenosis. Upper chest: Unremarkable. Other: Atherosclerotic calcifications of the carotid bulbs. IMPRESSION: 1. No acute intracranial abnormality. Large posterior scalp hematoma without underlying calvarial fracture. 2. No acute cervical spine fracture or traumatic listhesis. 3. Postoperative changes of C5-C7 ACDF. Intact hardware. Electronically Signed   By: Emmit Alexanders M.D.   On: 11/12/2022 17:05   DG Shoulder Left  Result Date: 11/12/2022 CLINICAL DATA:  Left shoulder pain after fall. EXAM: LEFT SHOULDER -  2+ VIEW COMPARISON:  None Available. FINDINGS: There is no evidence of fracture or dislocation. There is no evidence of arthropathy or other focal bone abnormality. Soft tissues are unremarkable. IMPRESSION: Negative. Electronically Signed   By: Marijo Conception M.D.   On: 11/12/2022 16:44   DG Pelvis 1-2 Views  Result Date:  11/12/2022 CLINICAL DATA:  Chronic sacral pain. EXAM: PELVIS - 1-2 VIEW COMPARISON:  None Available. FINDINGS: There is no evidence of pelvic fracture or diastasis. No pelvic bone lesions are seen. IMPRESSION: Negative. Electronically Signed   By: Marijo Conception M.D.   On: 11/12/2022 16:43   DG Humerus Left  Result Date: 11/12/2022 CLINICAL DATA:  Left arm pain after fall. EXAM: LEFT HUMERUS - 2+ VIEW COMPARISON:  None Available. FINDINGS: There is no evidence of fracture or other focal bone lesions. Soft tissues are unremarkable. IMPRESSION: Negative. Electronically Signed   By: Marijo Conception M.D.   On: 11/12/2022 16:42   DG Chest 1 View  Result Date: 11/12/2022 CLINICAL DATA:  Trauma.  Left shoulder pain. EXAM: CHEST  1 VIEW COMPARISON:  Chest radiographs 03/08/2022 and 11/16/2021 FINDINGS: Cardiac silhouette is again moderately enlarged. Moderate calcification is again seen within the aortic arch. Mild bilateral chronic interstitial thickening is unchanged. Additional unchanged left costophrenic angle linear chronic scarring. No acute airspace opacity. No pleural effusion pneumothorax. ACDF hardware again overlies the lower cervical spine. IMPRESSION: 1. No acute lung process. 2. Stable cardiomegaly and chronic interstitial thickening. Electronically Signed   By: Yvonne Kendall M.D.   On: 11/12/2022 16:42   DG Wrist Complete Left  Result Date: 11/12/2022 CLINICAL DATA:  Status post fall 3 weeks ago. Pain. Bruising and swelling. EXAM: LEFT WRIST - COMPLETE 3+ VIEW COMPARISON:  Left hand radiographs 02/17/2014 FINDINGS: There is diffuse decreased bone mineralization. There is oblique linear lucency indicating an acute fracture of the radial styloid approximately 6 mm proximal from the distal tip. No significant displacement on frontal view, however there may be up to 3 mm diastasis at the fracture line seen on oblique view. There is also a 4 x 2 mm chronic calcific density just distal to the radial  styloid. Moderate to severe thumb carpometacarpal and moderate triscaphe joint space narrowing with thumb carpometacarpal subchondral sclerosis and mild peripheral osteophytosis. IMPRESSION: 1. Acute mildly displaced fracture of the radial styloid. 2. 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 Electronically Signed   By: Yvonne Kendall M.D.   On: 11/12/2022 10:17   DG Wrist Complete Right  Result Date: 11/12/2022 CLINICAL DATA:  Status post fall 3 weeks ago.  Pain. EXAM: RIGHT WRIST - COMPLETE 3+ VIEW COMPARISON:  Right hand radiographs 06/26/2021 FINDINGS: There is diffuse decreased bone mineralization. Severe thumb carpometacarpal joint space narrowing, subchondral sclerosis, and peripheral osteophytosis is similar to prior. Severe triscaphe joint space narrowing with unchanged bone-on-bone contact. There is minimal relative widening of the proximal aspect of the scaphoid interval measuring up to 3 mm compared to the lunotriquetral ligament interval measuring 1.5 mm. There is dorsal angulation of the lunate with the scapholunate angle measuring 91 degrees. No acute fracture is seen. No dislocation. IMPRESSION: 1. No acute fracture is seen. 2. Dorsal angulation of the lunate with the scapholunate angle measuring 91 degrees. This can be seen with dorsal intercalated segment instability. 3. 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 Electronically Signed   By: Yvonne Kendall M.D.   On: 11/12/2022 10:13    Assessment/Plan  1. Status post fall -  S/P fall at home -  CT head showed no acute intracranial abnormality and a large posterior scalp hematoma -  staples were removed already -   Home health PT 2X/week , for therapeutic strengthening exercises -    fall precautions  2. Closed fracture of left wrist, sequela Closed fracture of left scapula, unspecified part of scapula, sequela -  continue wrist splint and walker with  left arm rest -  has follow up with [facility] on 12/20/22  3. Hypothyroidism, unspecified type Lab Results  Component Value Date   TSH 0.08 (L) 09/04/2022    -  continue Levothyroxine  4. Dyslipidemia Lab Results  Component Value Date   CHOL 162 09/04/2022   HDL 52 09/04/2022   LDLCALC 87 09/04/2022   TRIG 131 09/04/2022   CHOLHDL 3.1 09/04/2022    -  continue Zetia  5. Coronary artery disease involving native coronary artery of native heart with other form of angina pectoris (HCC) -  denies chest pains -  continue Imdur and Zetia  6. Primary osteoarthritis of left knee -  [facility], Harriette Bouillonharles Magnant PA-C, on 11/28/22 and had Methylprednisolone injection  7. Chronic diastolic CHF (congestive heart failure) (HCC) -  continue Torsemide and Klor-con - Basic Metabolic Panel with eGFR    Labs/tests ordered:  BMP  Next appt:  03/04/2023

## 2022-12-12 LAB — BASIC METABOLIC PANEL WITH GFR
BUN: 14 mg/dL (ref 7–25)
CO2: 34 mmol/L — ABNORMAL HIGH (ref 20–32)
Calcium: 9.7 mg/dL (ref 8.6–10.4)
Chloride: 97 mmol/L — ABNORMAL LOW (ref 98–110)
Creat: 0.76 mg/dL (ref 0.60–0.95)
Glucose, Bld: 86 mg/dL (ref 65–99)
Potassium: 3.4 mmol/L — ABNORMAL LOW (ref 3.5–5.3)
Sodium: 143 mmol/L (ref 135–146)
eGFR: 76 mL/min/{1.73_m2} (ref >=60)

## 2022-12-20 DIAGNOSIS — S52512A Displaced fracture of left radial styloid process, initial encounter for closed fracture: Secondary | ICD-10-CM | POA: Diagnosis not present

## 2022-12-21 DIAGNOSIS — Z515 Encounter for palliative care: Secondary | ICD-10-CM | POA: Diagnosis not present

## 2022-12-21 DIAGNOSIS — S52515D Nondisplaced fracture of left radial styloid process, subsequent encounter for closed fracture with routine healing: Secondary | ICD-10-CM | POA: Diagnosis not present

## 2022-12-21 DIAGNOSIS — D692 Other nonthrombocytopenic purpura: Secondary | ICD-10-CM | POA: Diagnosis not present

## 2022-12-25 NOTE — Progress Notes (Signed)
-    kidney function is good, K 3.4 which is slightly low (normal 3.5-4.5), continue taking KCL supplementation

## 2022-12-25 NOTE — Progress Notes (Signed)
She can take it anytime of the day and will need labs to be repeated when she follows up in the clinic next time.

## 2022-12-26 ENCOUNTER — Telehealth: Payer: Self-pay

## 2022-12-26 NOTE — Telephone Encounter (Signed)
Sonya with [facility] called to inquire if patient is to have potassium recheck during home visit today. According to Kenard Gower, NP documentation on labs, patient is to have potassium checked at next in office appointment pending for June 2024.   Sonya verbalized understanding

## 2022-12-28 ENCOUNTER — Telehealth: Payer: Self-pay | Admitting: *Deleted

## 2022-12-28 NOTE — Telephone Encounter (Signed)
Melissa with [facility] called requesting verbal orders for  OT 1X5weeks and SN 1X1week.   Verbal orders given.

## 2023-01-01 DIAGNOSIS — Z86718 Personal history of other venous thrombosis and embolism: Secondary | ICD-10-CM | POA: Diagnosis not present

## 2023-01-01 DIAGNOSIS — S42192D Fracture of other part of scapula, left shoulder, subsequent encounter for fracture with routine healing: Secondary | ICD-10-CM | POA: Diagnosis not present

## 2023-01-01 DIAGNOSIS — I739 Peripheral vascular disease, unspecified: Secondary | ICD-10-CM | POA: Diagnosis not present

## 2023-01-01 DIAGNOSIS — Z556 Problems related to health literacy: Secondary | ICD-10-CM | POA: Diagnosis not present

## 2023-01-01 DIAGNOSIS — Z8673 Personal history of transient ischemic attack (TIA), and cerebral infarction without residual deficits: Secondary | ICD-10-CM | POA: Diagnosis not present

## 2023-01-01 DIAGNOSIS — I2510 Atherosclerotic heart disease of native coronary artery without angina pectoris: Secondary | ICD-10-CM | POA: Diagnosis not present

## 2023-01-01 DIAGNOSIS — Z79899 Other long term (current) drug therapy: Secondary | ICD-10-CM | POA: Diagnosis not present

## 2023-01-01 DIAGNOSIS — E669 Obesity, unspecified: Secondary | ICD-10-CM | POA: Diagnosis not present

## 2023-01-01 DIAGNOSIS — M6250 Muscle wasting and atrophy, not elsewhere classified, unspecified site: Secondary | ICD-10-CM | POA: Diagnosis not present

## 2023-01-01 DIAGNOSIS — S060XAD Concussion with loss of consciousness status unknown, subsequent encounter: Secondary | ICD-10-CM | POA: Diagnosis not present

## 2023-01-01 DIAGNOSIS — M1711 Unilateral primary osteoarthritis, right knee: Secondary | ICD-10-CM | POA: Diagnosis not present

## 2023-01-01 DIAGNOSIS — I872 Venous insufficiency (chronic) (peripheral): Secondary | ICD-10-CM | POA: Diagnosis not present

## 2023-01-01 DIAGNOSIS — I130 Hypertensive heart and chronic kidney disease with heart failure and stage 1 through stage 4 chronic kidney disease, or unspecified chronic kidney disease: Secondary | ICD-10-CM | POA: Diagnosis not present

## 2023-01-01 DIAGNOSIS — S52512D Displaced fracture of left radial styloid process, subsequent encounter for closed fracture with routine healing: Secondary | ICD-10-CM | POA: Diagnosis not present

## 2023-01-01 DIAGNOSIS — M4856XD Collapsed vertebra, not elsewhere classified, lumbar region, subsequent encounter for fracture with routine healing: Secondary | ICD-10-CM | POA: Diagnosis not present

## 2023-01-01 DIAGNOSIS — D696 Thrombocytopenia, unspecified: Secondary | ICD-10-CM | POA: Diagnosis not present

## 2023-01-01 DIAGNOSIS — Z8701 Personal history of pneumonia (recurrent): Secondary | ICD-10-CM | POA: Diagnosis not present

## 2023-01-01 DIAGNOSIS — N183 Chronic kidney disease, stage 3 unspecified: Secondary | ICD-10-CM | POA: Diagnosis not present

## 2023-01-01 DIAGNOSIS — E785 Hyperlipidemia, unspecified: Secondary | ICD-10-CM | POA: Diagnosis not present

## 2023-01-01 DIAGNOSIS — Z85828 Personal history of other malignant neoplasm of skin: Secondary | ICD-10-CM | POA: Diagnosis not present

## 2023-01-01 DIAGNOSIS — S0101XD Laceration without foreign body of scalp, subsequent encounter: Secondary | ICD-10-CM | POA: Diagnosis not present

## 2023-01-01 DIAGNOSIS — I5032 Chronic diastolic (congestive) heart failure: Secondary | ICD-10-CM | POA: Diagnosis not present

## 2023-01-01 DIAGNOSIS — E039 Hypothyroidism, unspecified: Secondary | ICD-10-CM | POA: Diagnosis not present

## 2023-01-01 DIAGNOSIS — I495 Sick sinus syndrome: Secondary | ICD-10-CM | POA: Diagnosis not present

## 2023-01-10 ENCOUNTER — Other Ambulatory Visit: Payer: Self-pay | Admitting: *Deleted

## 2023-01-10 DIAGNOSIS — S52512A Displaced fracture of left radial styloid process, initial encounter for closed fracture: Secondary | ICD-10-CM | POA: Diagnosis not present

## 2023-01-10 MED ORDER — EZETIMIBE 10 MG PO TABS: 10.0000 mg | ORAL_TABLET | Freq: Every morning | ORAL | 3 refills | Status: DC

## 2023-01-10 MED ORDER — ISOSORBIDE MONONITRATE ER 30 MG PO TB24: 30.0000 mg | ORAL_TABLET | Freq: Two times a day (BID) | ORAL | 3 refills | Status: DC

## 2023-01-17 DIAGNOSIS — I739 Peripheral vascular disease, unspecified: Secondary | ICD-10-CM | POA: Diagnosis not present

## 2023-01-17 DIAGNOSIS — S060XAD Concussion with loss of consciousness status unknown, subsequent encounter: Secondary | ICD-10-CM | POA: Diagnosis not present

## 2023-01-17 DIAGNOSIS — S42192D Fracture of other part of scapula, left shoulder, subsequent encounter for fracture with routine healing: Secondary | ICD-10-CM | POA: Diagnosis not present

## 2023-01-17 DIAGNOSIS — M4856XD Collapsed vertebra, not elsewhere classified, lumbar region, subsequent encounter for fracture with routine healing: Secondary | ICD-10-CM | POA: Diagnosis not present

## 2023-01-17 DIAGNOSIS — Z556 Problems related to health literacy: Secondary | ICD-10-CM | POA: Diagnosis not present

## 2023-01-17 DIAGNOSIS — E669 Obesity, unspecified: Secondary | ICD-10-CM | POA: Diagnosis not present

## 2023-01-17 DIAGNOSIS — D696 Thrombocytopenia, unspecified: Secondary | ICD-10-CM | POA: Diagnosis not present

## 2023-01-17 DIAGNOSIS — I495 Sick sinus syndrome: Secondary | ICD-10-CM | POA: Diagnosis not present

## 2023-01-17 DIAGNOSIS — S52512D Displaced fracture of left radial styloid process, subsequent encounter for closed fracture with routine healing: Secondary | ICD-10-CM | POA: Diagnosis not present

## 2023-01-17 DIAGNOSIS — I130 Hypertensive heart and chronic kidney disease with heart failure and stage 1 through stage 4 chronic kidney disease, or unspecified chronic kidney disease: Secondary | ICD-10-CM | POA: Diagnosis not present

## 2023-01-17 DIAGNOSIS — I2510 Atherosclerotic heart disease of native coronary artery without angina pectoris: Secondary | ICD-10-CM | POA: Diagnosis not present

## 2023-01-17 DIAGNOSIS — S0101XD Laceration without foreign body of scalp, subsequent encounter: Secondary | ICD-10-CM | POA: Diagnosis not present

## 2023-01-17 DIAGNOSIS — Z79899 Other long term (current) drug therapy: Secondary | ICD-10-CM | POA: Diagnosis not present

## 2023-01-17 DIAGNOSIS — E785 Hyperlipidemia, unspecified: Secondary | ICD-10-CM | POA: Diagnosis not present

## 2023-01-17 DIAGNOSIS — N183 Chronic kidney disease, stage 3 unspecified: Secondary | ICD-10-CM | POA: Diagnosis not present

## 2023-01-17 DIAGNOSIS — M6250 Muscle wasting and atrophy, not elsewhere classified, unspecified site: Secondary | ICD-10-CM | POA: Diagnosis not present

## 2023-01-17 DIAGNOSIS — E039 Hypothyroidism, unspecified: Secondary | ICD-10-CM | POA: Diagnosis not present

## 2023-01-17 DIAGNOSIS — Z8701 Personal history of pneumonia (recurrent): Secondary | ICD-10-CM | POA: Diagnosis not present

## 2023-01-17 DIAGNOSIS — I872 Venous insufficiency (chronic) (peripheral): Secondary | ICD-10-CM | POA: Diagnosis not present

## 2023-01-17 DIAGNOSIS — Z8673 Personal history of transient ischemic attack (TIA), and cerebral infarction without residual deficits: Secondary | ICD-10-CM | POA: Diagnosis not present

## 2023-01-17 DIAGNOSIS — Z86718 Personal history of other venous thrombosis and embolism: Secondary | ICD-10-CM | POA: Diagnosis not present

## 2023-01-17 DIAGNOSIS — I5032 Chronic diastolic (congestive) heart failure: Secondary | ICD-10-CM | POA: Diagnosis not present

## 2023-01-17 DIAGNOSIS — Z85828 Personal history of other malignant neoplasm of skin: Secondary | ICD-10-CM | POA: Diagnosis not present

## 2023-01-17 DIAGNOSIS — M1711 Unilateral primary osteoarthritis, right knee: Secondary | ICD-10-CM | POA: Diagnosis not present

## 2023-01-18 DIAGNOSIS — H0102B Squamous blepharitis left eye, upper and lower eyelids: Secondary | ICD-10-CM | POA: Diagnosis not present

## 2023-01-18 DIAGNOSIS — Z961 Presence of intraocular lens: Secondary | ICD-10-CM | POA: Diagnosis not present

## 2023-01-18 DIAGNOSIS — H401131 Primary open-angle glaucoma, bilateral, mild stage: Secondary | ICD-10-CM | POA: Diagnosis not present

## 2023-01-18 DIAGNOSIS — H0102A Squamous blepharitis right eye, upper and lower eyelids: Secondary | ICD-10-CM | POA: Diagnosis not present

## 2023-01-20 ENCOUNTER — Other Ambulatory Visit: Payer: Self-pay | Admitting: Cardiovascular Disease

## 2023-01-25 DIAGNOSIS — D692 Other nonthrombocytopenic purpura: Secondary | ICD-10-CM | POA: Diagnosis not present

## 2023-01-25 DIAGNOSIS — Z515 Encounter for palliative care: Secondary | ICD-10-CM | POA: Diagnosis not present

## 2023-01-25 DIAGNOSIS — I4891 Unspecified atrial fibrillation: Secondary | ICD-10-CM | POA: Diagnosis not present

## 2023-01-25 DIAGNOSIS — I10 Essential (primary) hypertension: Secondary | ICD-10-CM | POA: Diagnosis not present

## 2023-02-01 ENCOUNTER — Other Ambulatory Visit: Payer: Self-pay | Admitting: Cardiovascular Disease

## 2023-02-01 DIAGNOSIS — S52512A Displaced fracture of left radial styloid process, initial encounter for closed fracture: Secondary | ICD-10-CM | POA: Diagnosis not present

## 2023-02-04 ENCOUNTER — Other Ambulatory Visit: Payer: Self-pay

## 2023-02-04 DIAGNOSIS — J45909 Unspecified asthma, uncomplicated: Secondary | ICD-10-CM | POA: Diagnosis not present

## 2023-02-04 DIAGNOSIS — I2510 Atherosclerotic heart disease of native coronary artery without angina pectoris: Secondary | ICD-10-CM | POA: Diagnosis not present

## 2023-02-04 DIAGNOSIS — D696 Thrombocytopenia, unspecified: Secondary | ICD-10-CM | POA: Diagnosis not present

## 2023-02-04 DIAGNOSIS — E782 Mixed hyperlipidemia: Secondary | ICD-10-CM | POA: Diagnosis not present

## 2023-02-04 DIAGNOSIS — Z8701 Personal history of pneumonia (recurrent): Secondary | ICD-10-CM | POA: Diagnosis not present

## 2023-02-04 DIAGNOSIS — Z8673 Personal history of transient ischemic attack (TIA), and cerebral infarction without residual deficits: Secondary | ICD-10-CM | POA: Diagnosis not present

## 2023-02-04 DIAGNOSIS — S42192D Fracture of other part of scapula, left shoulder, subsequent encounter for fracture with routine healing: Secondary | ICD-10-CM | POA: Diagnosis not present

## 2023-02-04 DIAGNOSIS — I130 Hypertensive heart and chronic kidney disease with heart failure and stage 1 through stage 4 chronic kidney disease, or unspecified chronic kidney disease: Secondary | ICD-10-CM | POA: Diagnosis not present

## 2023-02-04 DIAGNOSIS — M6250 Muscle wasting and atrophy, not elsewhere classified, unspecified site: Secondary | ICD-10-CM | POA: Diagnosis not present

## 2023-02-04 DIAGNOSIS — E785 Hyperlipidemia, unspecified: Secondary | ICD-10-CM | POA: Diagnosis not present

## 2023-02-04 DIAGNOSIS — I5032 Chronic diastolic (congestive) heart failure: Secondary | ICD-10-CM | POA: Diagnosis not present

## 2023-02-04 DIAGNOSIS — N183 Chronic kidney disease, stage 3 unspecified: Secondary | ICD-10-CM | POA: Diagnosis not present

## 2023-02-04 DIAGNOSIS — S060XAD Concussion with loss of consciousness status unknown, subsequent encounter: Secondary | ICD-10-CM | POA: Diagnosis not present

## 2023-02-04 DIAGNOSIS — I495 Sick sinus syndrome: Secondary | ICD-10-CM | POA: Diagnosis not present

## 2023-02-04 DIAGNOSIS — M4856XD Collapsed vertebra, not elsewhere classified, lumbar region, subsequent encounter for fracture with routine healing: Secondary | ICD-10-CM | POA: Diagnosis not present

## 2023-02-04 DIAGNOSIS — Z85828 Personal history of other malignant neoplasm of skin: Secondary | ICD-10-CM | POA: Diagnosis not present

## 2023-02-04 DIAGNOSIS — Z79899 Other long term (current) drug therapy: Secondary | ICD-10-CM | POA: Diagnosis not present

## 2023-02-04 DIAGNOSIS — I739 Peripheral vascular disease, unspecified: Secondary | ICD-10-CM | POA: Diagnosis not present

## 2023-02-04 DIAGNOSIS — M47812 Spondylosis without myelopathy or radiculopathy, cervical region: Secondary | ICD-10-CM | POA: Diagnosis not present

## 2023-02-04 DIAGNOSIS — E039 Hypothyroidism, unspecified: Secondary | ICD-10-CM | POA: Diagnosis not present

## 2023-02-04 DIAGNOSIS — G4710 Hypersomnia, unspecified: Secondary | ICD-10-CM | POA: Diagnosis not present

## 2023-02-04 DIAGNOSIS — S52512D Displaced fracture of left radial styloid process, subsequent encounter for closed fracture with routine healing: Secondary | ICD-10-CM | POA: Diagnosis not present

## 2023-02-04 DIAGNOSIS — E669 Obesity, unspecified: Secondary | ICD-10-CM | POA: Diagnosis not present

## 2023-02-04 DIAGNOSIS — M1711 Unilateral primary osteoarthritis, right knee: Secondary | ICD-10-CM | POA: Diagnosis not present

## 2023-02-04 DIAGNOSIS — I872 Venous insufficiency (chronic) (peripheral): Secondary | ICD-10-CM | POA: Diagnosis not present

## 2023-02-05 LAB — BASIC METABOLIC PANEL
BUN/Creatinine Ratio: 16 (ref 12–28)
BUN: 12 mg/dL (ref 8–27)
CO2: 30 mmol/L — ABNORMAL HIGH (ref 20–29)
Calcium: 9.8 mg/dL (ref 8.7–10.3)
Chloride: 98 mmol/L (ref 96–106)
Creatinine, Ser: 0.74 mg/dL (ref 0.57–1.00)
Glucose: 97 mg/dL (ref 70–99)
Potassium: 3.8 mmol/L (ref 3.5–5.2)
Sodium: 141 mmol/L (ref 134–144)
eGFR: 78 mL/min/{1.73_m2} (ref >59)

## 2023-02-05 LAB — LIPID PANEL
Chol/HDL Ratio: 2 ratio (ref 0.0–4.4)
Cholesterol, Total: 125 mg/dL (ref 100–199)
HDL: 64 mg/dL (ref >39)
LDL Chol Calc (NIH): 44 mg/dL (ref 0–99)
Triglycerides: 90 mg/dL (ref 0–149)
VLDL Cholesterol Cal: 17 mg/dL (ref 5–40)

## 2023-02-07 ENCOUNTER — Other Ambulatory Visit: Payer: Self-pay | Admitting: Family

## 2023-02-07 DIAGNOSIS — F411 Generalized anxiety disorder: Secondary | ICD-10-CM

## 2023-02-08 ENCOUNTER — Encounter: Payer: Self-pay | Admitting: Orthopedic Surgery

## 2023-02-08 ENCOUNTER — Encounter: Payer: PPO | Admitting: Orthopedic Surgery

## 2023-02-08 ENCOUNTER — Ambulatory Visit (INDEPENDENT_AMBULATORY_CARE_PROVIDER_SITE_OTHER): Payer: PPO | Admitting: Orthopedic Surgery

## 2023-02-08 VITALS — BP 110/66 | HR 70 | Temp 97.2°F | Resp 16 | Ht 65.5 in | Wt 176.0 lb

## 2023-02-08 DIAGNOSIS — R42 Dizziness and giddiness: Secondary | ICD-10-CM

## 2023-02-08 LAB — CBC WITH DIFFERENTIAL/PLATELET
Absolute Monocytes: 472 cells/uL (ref 200–950)
Basophils Absolute: 53 cells/uL (ref 0–200)
Basophils Relative: 1 %
Eosinophils Absolute: 302 cells/uL (ref 15–500)
Eosinophils Relative: 5.7 %
HCT: 42.3 % (ref 35.0–45.0)
Hemoglobin: 14.2 g/dL (ref 11.7–15.5)
Lymphs Abs: 1479 cells/uL (ref 850–3900)
MCH: 32.6 pg (ref 27.0–33.0)
MCHC: 33.6 g/dL (ref 32.0–36.0)
MCV: 97.2 fL (ref 80.0–100.0)
MPV: 11 fL (ref 7.5–12.5)
Monocytes Relative: 8.9 %
Neutro Abs: 2995 cells/uL (ref 1500–7800)
Neutrophils Relative %: 56.5 %
Platelets: 110 10*3/uL — ABNORMAL LOW (ref 140–400)
RBC: 4.35 10*6/uL (ref 3.80–5.10)
RDW: 11.8 % (ref 11.0–15.0)
Total Lymphocyte: 27.9 %
WBC: 5.3 10*3/uL (ref 3.8–10.8)

## 2023-02-08 NOTE — Patient Instructions (Signed)
Recommend good hydration with water  Will check blood counts  Please take blood pressure twice daily x 2 weeks> bring readings next visit

## 2023-02-08 NOTE — Progress Notes (Signed)
Careteam: Patient Care Team: [name], [name], NP as PCP - General (Family Medicine) Croitoru, Rachelle Hora, MD as PCP - Cardiology (Cardiology) Croitoru, Rachelle Hora, MD as Consulting Physician (Cardiology) Cherlyn Roberts, MD as Referring Physician (Dermatology) Park Liter, DPM (Inactive) as Consulting Physician (Podiatry) [place], [name], OD (Optometry) Jerrell Mylar, MD as Referring Physician (Gynecology)  Seen by: Hazle Nordmann, AGNP-C  PLACE OF SERVICE:  [facility]  Advanced Directive information Does Patient Have a Medical Advance Directive?: Yes, Type of Advance Directive: Out of facility DNR (pink MOST or yellow form), Does patient want to make changes to medical advance directive?: No - Patient declined  Allergies  Allergen Reactions   Atorvastatin Anaphylaxis and Other (See Comments)    Patient does not remember; caused pneumonia- took her off of it per patient   Penicillins Anaphylaxis, Hives, Swelling and Other (See Comments)    Tongue swelling Did it involve swelling of the face/tongue/throat, SOB, or low BP? Yes Did it involve sudden or severe rash/hives, skin peeling, or any reaction on the inside of your mouth or nose? Yes Did you need to seek medical attention at a hospital or doctor's office? Yes When did it last happen?  28 or 89 years old    If all above answers are "NO", may proceed with cephalosporin use.    Shellfish Allergy Anaphylaxis, Nausea And Vomiting and Other (See Comments)    Severe nausea and vomiting   Aminophylline Itching, Swelling, Rash and Other (See Comments)    Pt experienced burning urination, itching, redness/rash, and swelling of genitals after given this medication via IV push.   Iodinated Contrast Media Swelling and Other (See Comments)    FLUSHING, also; LLE became swollen   Latex Other (See Comments)    Causes blisters   Oxybutynin Chloride Other (See Comments)    "blisters"   Propoxyphene Other (See Comments)    Unknown  reaction   Vancomycin Hives and Other (See Comments)    06/02/2018- Hives arm, back and chest   Adhesive [Tape] Rash and Other (See Comments)    Paper tape is ok   Betadine [Povidone Iodine] Rash   Ciprofloxacin Hives   Codeine Nausea And Vomiting    .   Ranolazine Other (See Comments)    Constipation  .    Chief Complaint  Patient presents with   Acute Visit    Patient complains of being dizzy/lightheaded since 11/12/2022.    Error     HPI: Patient is a 89 y.o. female   Review of Systems:  ROS  Past Medical History:  Diagnosis Date   Arthritis    right knee   Cancer (HCC) yrs ago   skin cancer removed from face   Chronic diastolic CHF (congestive heart failure) (HCC)    Chronic venous insufficiency    LEA VENOUS, 10/17/2011 - mild reflux in bilateral common femoral veins   CKD (chronic kidney disease), stage III (HCC)    Coronary artery disease    a. s/p PCI/BMS to prox LAD and balloon angioplasty to mLAD with suboptimal result in 2000. b. Abnl nuc 2012, cath 09/2011 - showed that the overall territory of potential ischemia was small and attributable to a moderately diseased small second diagonal artery. Med rx.   Dyspnea    with activity   Headache    History of blood transfusion 2017   Hyperlipidemia    Hypertension    Hypertensive heart disease    Hypothyroidism    Obesity  Pneumonia    several times   Stroke [facility])    pt. states she had "light stroke" in sept. 1980   Urinary incontinence    Past Surgical History:  Procedure Laterality Date   ABDOMINAL HYSTERECTOMY  1970's   complete   ANKLE ARTHROSCOPY Right 07/10/2017   Procedure: ANKLE ARTHROSCOPY;  Surgeon: Park Liter, DPM;  Location: [facility] OR;  Service: Podiatry;  Laterality: Right;   BACK SURGERY  07/26/2016   cervical neck surgery, [facility]   BALLOON DILATION N/A 12/01/2021   Procedure: BALLOON DILATION;  Surgeon: Jeani Hawking, MD;  Location: [facility] ENDOSCOPY;  Service:  Gastroenterology;  Laterality: N/A;   BLADDER SURGERY  2010   WITH MESH  bladder tach   bunion removal surgery Bilateral 15 yrs ago   CARDIAC CATHETERIZATION Left 09/25/2011   Medical management   CARDIAC CATHETERIZATION Left 03/25/2001   Normal LV function, LAD residual narrowing of less than 10%, normal ramus intermediate, circumflex, and RCA,    CARDIAC CATHETERIZATION  09/04/1999   LAD, 3x48mm Tetra stent resulting in a reduction of the 80% stenosis to 0% residual   CARDIAC CATHETERIZATION N/A 01/26/2015   Procedure: Right/Left Heart Cath and Coronary Angiography;  Surgeon: Lennette Bihari, MD;  Location: [facility] INVASIVE CV LAB;  Service: Cardiovascular;  Laterality: N/A;   CARDIAC CATHETERIZATION N/A 01/27/2015   Procedure: Intravascular Pressure Wire/FFR Study;  Surgeon: Kathleene Hazel, MD;  Location[facility] INVASIVE CV LAB;  Service: Cardiovascular;  Laterality: N/A;   CARDIAC CATHETERIZATION N/A 01/27/2015   Procedure: Right Heart Cath;  Surgeon: Kathleene Hazel, MD;  Location: [facility] INVASIVE CV LAB;  Service: Cardiovascular;  Laterality: N/A;   CHOLECYSTECTOMY     COLONOSCOPY WITH PROPOFOL N/A 03/07/2017   Procedure: COLONOSCOPY WITH PROPOFOL;  Surgeon: Charna Elizabeth, MD;  Location: [facility] ENDOSCOPY;  Service: Endoscopy;  Laterality: N/A;   CORONARY PRESSURE/FFR STUDY N/A 04/05/2017   Procedure: Intravascular Pressure Wire/FFR Study;  Surgeon: Swaziland, Peter M, MD;  Location: [facility] INVASIVE CV LAB;  Service: Cardiovascular;  Laterality: N/A;   CORONARY STENT PLACEMENT     LAD x 1   ESOPHAGOGASTRODUODENOSCOPY (EGD) WITH PROPOFOL N/A 12/01/2021   Procedure: ESOPHAGOGASTRODUODENOSCOPY (EGD) WITH PROPOFOL;  Surgeon: Jeani Hawking, MD;  Location: [facility];  Service: Gastroenterology;  Laterality: N/A;   ganglion cyst removal  yrs ago   x 2   ILIAC VEIN ANGIOPLASTY / STENTING  02/15/2015   IVC FILTER INSERTION  2016   IVC FILTER REMOVAL  02/15/2015   at [facility]   LEFT HEART CATH AND CORONARY  ANGIOGRAPHY N/A 04/05/2017   Procedure: Left Heart Cath and Coronary Angiography;  Surgeon: Swaziland, Peter M, MD;  Location: [facility] INVASIVE CV LAB;  Service: Cardiovascular;  Laterality: N/A;   LEFT HEART CATHETERIZATION WITH CORONARY ANGIOGRAM N/A 09/25/2011   Procedure: LEFT HEART CATHETERIZATION WITH CORONARY ANGIOGRAM;  Surgeon: Thurmon Fair, MD;  Location: [facility] CATH LAB;  Service: Cardiovascular;  Laterality: N/A;   multiple bladder surgeries to remove mesh     ROTATOR CUFF REPAIR Right    stent to groin Left 08/2014   left leg   TENDON REPAIR Right 07/10/2017   Procedure: RIGHT PERONEAL TENDON REPAIR;  Surgeon: Park Liter, DPM;  Location: [facility] OR;  Service: Podiatry;  Laterality: Right;   TENDON REPAIR Right 05/27/2019   Procedure: PERONEAL TENDON REPAIR x2;  Surgeon: Park Liter, DPM;  Location: [facility] ORS;  Service: Podiatry;  Laterality: Right;   Social History:   reports that  she has never smoked. She has never used smokeless tobacco. She reports that she does not drink alcohol and does not use drugs.  Family History  Problem Relation Age of Onset   Cancer Mother    Heart disease Father    Heart disease Sister    High blood pressure Sister    Heart disease Sister    Cancer Sister    Heart disease Brother    Diabetes Brother    Brain cancer Brother    High blood pressure Brother    Heart disease Brother    Heart attack Brother    Heart disease Brother    High blood pressure Brother     Medications: Patient's Medications  New Prescriptions   No medications on file  Previous Medications   CHOLECALCIFEROL (VITAMIN D3) 25 MCG (1000 UNIT) TABLET    Take 1,000 Units by mouth daily.    CYANOCOBALAMIN (VITAMIN B-12 PO)    Take 1 tablet by mouth daily.   EZETIMIBE (ZETIA) 10 MG TABLET    Take 1 tablet (10 mg total) by mouth every morning.   ISOSORBIDE MONONITRATE (IMDUR) 30 MG 24 HR TABLET    Take 1 tablet (30 mg total) by mouth 2 (two) times daily.   KETOCONAZOLE (NIZORAL) 2 %  SHAMPOO    Apply 1 application  topically daily as needed for irritation (or itching- SHAMPOO).   LATANOPROST (XALATAN) 0.005 % OPHTHALMIC SOLUTION    Place 1 drop into both eyes at bedtime.   LEVOTHYROXINE (SYNTHROID) 88 MCG TABLET    TAKE ONE TABLET BY MOUTH BEFORE BREAKFAST daily   OVER THE COUNTER MEDICATION    Take 1 capsule by mouth daily. OmegaXL joint and muscle support   POTASSIUM CHLORIDE SA (KLOR-CON M) 20 MEQ TABLET    TAKE ONE TABLET BY MOUTH TWICE DAILY   PRAVASTATIN (PRAVACHOL) 80 MG TABLET    Take 80 mg by mouth at bedtime.   SERTRALINE (ZOLOFT) 25 MG TABLET    Take 1 tablet by mouth once daily.   TORSEMIDE (DEMADEX) 100 MG TABLET    195 lbs OR less ONE-HALF tab IN THE MORNING & IN THE EVENING. 196lbs OR higher ONE tab IN THE MORNING & ONE-HALF tab IN THE EVENING   TYLENOL 8 HOUR ARTHRITIS PAIN 650 MG CR TABLET    Take 650 mg by mouth every 8 (eight) hours as needed for pain.   VITAMIN C (ASCORBIC ACID) 500 MG TABLET    Take 500 mg by mouth daily.  Modified Medications   No medications on file  Discontinued Medications   No medications on file    Physical Exam:  There were no vitals filed for this visit. There is no height or weight on file to calculate BMI. Wt Readings from Last 3 Encounters:  02/08/23 176 lb (79.8 kg)  12/11/22 175 lb 12.8 oz (79.7 kg)  11/16/22 184 lb 11.9 oz (83.8 kg)    Physical Exam  Labs reviewed: Basic Metabolic Panel: Recent Labs    03/07/22 1014 03/08/22 1416 03/09/22 0439 09/04/22 0938 11/12/22 1915 11/15/22 0453 12/11/22 1459 02/04/23 1156  NA 140 137   < > 142   < > 137 143 141  K 2.5* 2.2*   < > 3.4*   < > 3.8 3.4* 3.8  CL 90* 89*   < > 101   < > 100 97* 98  CO2 35* 37*   < > 33*   < > 30 34* 30*  GLUCOSE  100* 111*   < > 101*   < > 98 86 97  BUN 43* 40*   < > 18   < > 15 14 12   CREATININE 1.15* 1.17*   < > 0.82   < > 0.74 0.76 0.74  CALCIUM 9.9 10.4*   < > 9.5   < > 8.5* 9.7 9.8  MG  --  2.1  --   --   --  2.0  --   --    TSH 1.02  --   --  0.08*  --   --   --   --    < > = values in this interval not displayed.    Liver Function Tests: Recent Labs    03/09/22 0439 09/04/22 0938 11/12/22 1915 11/13/22 0624  AST 42* 32 40 30  ALT 27 15 17 16   ALKPHOS 57  --  79 71  BILITOT 1.1 1.4* 1.2 1.2  PROT 5.7* 6.2 5.6* 5.7*  ALBUMIN 2.9*  --  2.9* 2.8*    No results for input(s): "LIPASE", "AMYLASE" in the last 8760 hours. No results for input(s): "AMMONIA" in the last 8760 hours. CBC: Recent Labs    11/12/22 1915 11/13/22 0624 11/15/22 0453 02/08/23 1449  WBC 7.4 5.9 5.7 5.3  NEUTROABS 6.2  --  3.6 2,995  HGB 13.4 13.0 12.4 14.2  HCT 41.4 39.5 37.8 42.3  MCV 102.5* 100.5* 100.5* 97.2  PLT 79* 73* 78* 110*    Lipid Panel: Recent Labs    03/07/22 1014 09/04/22 0938 02/04/23 1156  CHOL 139 162 125  HDL 71 52 64  LDLCALC 52 87 44  TRIG 82 131 90  CHOLHDL 2.0 3.1 2.0    TSH: Recent Labs    03/07/22 1014 09/04/22 0938  TSH 1.02 0.08*    A1C: Lab Results  Component Value Date   HGBA1C 6.1 (H) 01/26/2015     Assessment/Plan There are no diagnoses linked to this encounter.  Next appt:  Kasondra Junod Scherry Ran  [facility] [phone]  This encounter was created in error - please disregard.

## 2023-02-08 NOTE — Progress Notes (Signed)
Careteam: Patient Care Team: [name], [name], NP as PCP - General (Family Medicine) Croitoru, Rachelle Hora, MD as PCP - Cardiology (Cardiology) Croitoru, Rachelle Hora, MD as Consulting Physician (Cardiology) Cherlyn Roberts, MD as Referring Physician (Dermatology) Park Liter, DPM (Inactive) as Consulting Physician (Podiatry) Burund[place], [name], OD (Optometry) Jerrell Mylar, MD as Referring Physician (Gynecology)  Seen by: Hazle Nordmann, AGNP-C  PLACE OF SERVICE:  [facility]  Advanced Directive information Does Patient Have a Medical Advance Directive?: Yes, Type of Advance Directive: Out of facility DNR (pink MOST or yellow form), Does patient want to make changes to medical advance directive?: No - Patient declined  Allergies  Allergen Reactions   Atorvastatin Anaphylaxis and Other (See Comments)    Patient does not remember; caused pneumonia- took her off of it per patient   Penicillins Anaphylaxis, Hives, Swelling and Other (See Comments)    Tongue swelling Did it involve swelling of the face/tongue/throat, SOB, or low BP? Yes Did it involve sudden or severe rash/hives, skin peeling, or any reaction on the inside of your mouth or nose? Yes Did you need to seek medical attention at a hospital or doctor's office? Yes When did it last happen?  89 or 89 years old    If all above answers are "NO", may proceed with cephalosporin use.    Shellfish Allergy Anaphylaxis, Nausea And Vomiting and Other (See Comments)    Severe nausea and vomiting   Aminophylline Itching, Swelling, Rash and Other (See Comments)    Pt experienced burning urination, itching, redness/rash, and swelling of genitals after given this medication via IV push.   Iodinated Contrast Media Swelling and Other (See Comments)    FLUSHING, also; LLE became swollen   Latex Other (See Comments)    Causes blisters   Oxybutynin Chloride Other (See Comments)    "blisters"   Propoxyphene Other (See Comments)    Unknown  reaction   Vancomycin Hives and Other (See Comments)    06/02/2018- Hives arm, back and chest   Adhesive [Tape] Rash and Other (See Comments)    Paper tape is ok   Betadine [Povidone Iodine] Rash   Ciprofloxacin Hives   Codeine Nausea And Vomiting    .   Ranolazine Other (See Comments)    Constipation  .    Chief Complaint  Patient presents with   Acute Visit    Patient complains of being dizzy/lightheaded.      HPI: Patient is a 89 y.o. female seen today for acute visit due to dizziness.   Last night she felt dizziness when walking to bed. Appears she has had multiple falls within the past few months. She reports having head injury with laceration 10/2022. Also has left radial fracture> followed by ortho. 03/20 CT head negative for acute intracranial abnormality. She denies blood loss or dizziness when changing position. She is checking blood pressures at home and reports SBP 120's. She is on torsemide 100 mg daily for CHF. Admits to drinking fluids well. Afebrile. Vitals stable.   Ambulating with platform walker. PT/OT are coming once weekly. Denies recent fall.     Review of Systems:  Review of Systems  Constitutional:  Negative for chills and fever.  Respiratory:  Negative for cough, shortness of breath and wheezing.   Cardiovascular:  Negative for chest pain and leg swelling.  Gastrointestinal:  Negative for abdominal pain.  Genitourinary:  Negative for dysuria.  Musculoskeletal:  Positive for falls and joint pain.  Neurological:  Positive for  dizziness and weakness.  Psychiatric/Behavioral:  Negative for depression. The patient is not nervous/anxious.     Past Medical History:  Diagnosis Date   Arthritis    right knee   Cancer (HCC) yrs ago   skin cancer removed from face   Chronic diastolic CHF (congestive heart failure) (HCC)    Chronic venous insufficiency    LEA VENOUS, 10/17/2011 - mild reflux in bilateral common femoral veins   CKD (chronic kidney  disease), stage III (HCC)    Coronary artery disease    a. s/p PCI/BMS to prox LAD and balloon angioplasty to mLAD with suboptimal result in 2000. b. Abnl nuc 2012, cath 09/2011 - showed that the overall territory of potential ischemia was small and attributable to a moderately diseased small second diagonal artery. Med rx.   Dyspnea    with activity   Headache    History of blood transfusion 2017   Hyperlipidemia    Hypertension    Hypertensive heart disease    Hypothyroidism    Obesity    Pneumonia    several times   Stroke [facility])    pt. states she had "light stroke" in sept. 1980   Urinary incontinence    Past Surgical History:  Procedure Laterality Date   ABDOMINAL HYSTERECTOMY  1970's   complete   ANKLE ARTHROSCOPY Right 07/10/2017   Procedure: ANKLE ARTHROSCOPY;  Surgeon: Park Liter, DPM;  Location: [facility] OR;  Service: Podiatry;  Laterality: Right;   BACK SURGERY  07/26/2016   cervical neck surgery, [facility]   BALLOON DILATION N/A 12/01/2021   Procedure: BALLOON DILATION;  Surgeon: Jeani Hawking, MD;  Location: [facility];  Service: Gastroenterology;  Laterality: N/A;   BLADDER SURGERY  2010   WITH MESH  bladder tach   bunion removal surgery Bilateral 15 yrs ago   CARDIAC CATHETERIZATION Left 09/25/2011   Medical management   CARDIAC CATHETERIZATION Left 03/25/2001   Normal LV function, LAD residual narrowing of less than 10%, normal ramus intermediate, circumflex, and RCA,    CARDIAC CATHETERIZATION  09/04/1999   LAD, 3x33mm Tetra stent resulting in a reduction of the 80% stenosis to 0% residual   CARDIAC CATHETERIZATION N/A 01/26/2015   Procedure: Right/Left Heart Cath and Coronary Angiography;  Surgeon: Lennette Bihari, MD;  Location: [facility] INVASIVE CV LAB;  Service: Cardiovascular;  Laterality: N/A;   CARDIAC CATHETERIZATION N/A 01/27/2015   Procedure: Intravascular Pressure Wire/FFR Study;  Surgeon: Kathleene Hazel, MD;  Location: [facility] INVASIVE CV LAB;   Service: Cardiovascular;  Laterality: N/A;   CARDIAC CATHETERIZATION N/A 01/27/2015   Procedure: Right Heart Cath;  Surgeon: Kathleene Hazel, MD;  Location: [facility] INVASIVE CV LAB;  Service: Cardiovascular;  Laterality: N/A;   CHOLECYSTECTOMY     COLONOSCOPY WITH PROPOFOL N/A 03/07/2017   Procedure: COLONOSCOPY WITH PROPOFOL;  Surgeon: Charna Elizabeth, MD;  Location: [facility];  Service: Endoscopy;  Laterality: N/A;   CORONARY PRESSURE/FFR STUDY N/A 04/05/2017   Procedure: Intravascular Pressure Wire/FFR Study;  Surgeon: Swaziland, Peter M, MD;  Location: [facility] INVASIVE CV LAB;  Service: Cardiovascular;  Laterality: N/A;   CORONARY STENT PLACEMENT     LAD x 1   ESOPHAGOGASTRODUODENOSCOPY (EGD) WITH PROPOFOL N/A 12/01/2021   Procedure: ESOPHAGOGASTRODUODENOSCOPY (EGD) WITH PROPOFOL;  Surgeon: Jeani Hawking, MD;  Location: [facility];  Service: Gastroenterology;  Laterality: N/A;   ganglion cyst removal  yrs ago   x 2   ILIAC VEIN ANGIOPLASTY / STENTING  02/15/2015   IVC FILTER  INSERTION  2016   IVC FILTER REMOVAL  02/15/2015   at [facility]   LEFT HEART CATH AND CORONARY ANGIOGRAPHY N/A 04/05/2017   Procedure: Left Heart Cath and Coronary Angiography;  Surgeon: Swaziland, Peter M, MD;  Location: [facility] INVASIVE CV LAB;  Service: Cardiovascular;  Laterality: N/A;   LEFT HEART CATHETERIZATION WITH CORONARY ANGIOGRAM N/A 09/25/2011   Procedure: LEFT HEART CATHETERIZATION WITH CORONARY ANGIOGRAM;  Surgeon: Thurmon Fair, MD;  Location: [facility] CATH LAB;  Service: Cardiovascular;  Laterality: N/A;   multiple bladder surgeries to remove mesh     ROTATOR CUFF REPAIR Right    stent to groin Left 08/2014   left leg   TENDON REPAIR Right 07/10/2017   Procedure: RIGHT PERONEAL TENDON REPAIR;  Surgeon: Park Liter, DPM;  Location: [facility] OR;  Service: Podiatry;  Laterality: Right;   TENDON REPAIR Right 05/27/2019   Procedure: PERONEAL TENDON REPAIR x2;  Surgeon: Park Liter, DPM;  Location: [facility] ORS;  Service: Podiatry;   Laterality: Right;   Social History:   reports that she has never smoked. She has never used smokeless tobacco. She reports that she does not drink alcohol and does not use drugs.  Family History  Problem Relation Age of Onset   Cancer Mother    Heart disease Father    Heart disease Sister    High blood pressure Sister    Heart disease Sister    Cancer Sister    Heart disease Brother    Diabetes Brother    Brain cancer Brother    High blood pressure Brother    Heart disease Brother    Heart attack Brother    Heart disease Brother    High blood pressure Brother     Medications: Patient's Medications  New Prescriptions   No medications on file  Previous Medications   CHOLECALCIFEROL (VITAMIN D3) 25 MCG (1000 UNIT) TABLET    Take 1,000 Units by mouth daily.    CYANOCOBALAMIN (VITAMIN B-12 PO)    Take 1 tablet by mouth daily.   EZETIMIBE (ZETIA) 10 MG TABLET    Take 1 tablet (10 mg total) by mouth every morning.   ISOSORBIDE MONONITRATE (IMDUR) 30 MG 24 HR TABLET    Take 1 tablet (30 mg total) by mouth 2 (two) times daily.   KETOCONAZOLE (NIZORAL) 2 % SHAMPOO    Apply 1 application  topically daily as needed for irritation (or itching- SHAMPOO).   LATANOPROST (XALATAN) 0.005 % OPHTHALMIC SOLUTION    Place 1 drop into both eyes at bedtime.   LEVOTHYROXINE (SYNTHROID) 88 MCG TABLET    TAKE ONE TABLET BY MOUTH BEFORE BREAKFAST daily   OVER THE COUNTER MEDICATION    Take 1 capsule by mouth daily. OmegaXL joint and muscle support   POTASSIUM CHLORIDE SA (KLOR-CON M) 20 MEQ TABLET    TAKE ONE TABLET BY MOUTH TWICE DAILY   PRAVASTATIN (PRAVACHOL) 80 MG TABLET    Take 80 mg by mouth at bedtime.   SERTRALINE (ZOLOFT) 25 MG TABLET    Take 1 tablet by mouth once daily.   TORSEMIDE (DEMADEX) 100 MG TABLET    195 lbs OR less ONE-HALF tab IN THE MORNING & IN THE EVENING. 196lbs OR higher ONE tab IN THE MORNING & ONE-HALF tab IN THE EVENING   TYLENOL 8 HOUR ARTHRITIS PAIN 650 MG CR TABLET     Take 650 mg by mouth every 8 (eight) hours as needed for pain.   VITAMIN C (ASCORBIC ACID)  500 MG TABLET    Take 500 mg by mouth daily.  Modified Medications   No medications on file  Discontinued Medications   No medications on file    Physical Exam:  Vitals:   02/08/23 1419  BP: 110/66  Pulse: 70  Resp: 16  Temp: (!) 97.2 F (36.2 C)  SpO2: 93%  Weight: 176 lb (79.8 kg)  Height: 5' 5.5" (1.664 m)   Body mass index is 28.84 kg/m. Wt Readings from Last 3 Encounters:  02/08/23 176 lb (79.8 kg)  12/11/22 175 lb 12.8 oz (79.7 kg)  11/16/22 184 lb 11.9 oz (83.8 kg)    Physical Exam Vitals reviewed.  Constitutional:      General: She is not in acute distress. HENT:     Head: Normocephalic.  Eyes:     General:        Right eye: No discharge.        Left eye: No discharge.     Extraocular Movements:     Right eye: Normal extraocular motion and no nystagmus.     Left eye: Normal extraocular motion and no nystagmus.  Cardiovascular:     Rate and Rhythm: Normal rate and regular rhythm.     Pulses: Normal pulses.     Heart sounds: Normal heart sounds.  Pulmonary:     Effort: Pulmonary effort is normal. No respiratory distress.     Breath sounds: Normal breath sounds. No wheezing.  Abdominal:     General: Bowel sounds are normal.     Palpations: Abdomen is soft.  Musculoskeletal:     Cervical back: Neck supple.     Right lower leg: No edema.     Left lower leg: No edema.  Skin:    General: Skin is warm.     Capillary Refill: Capillary refill takes less than 2 seconds.  Neurological:     General: No focal deficit present.     Mental Status: She is alert and oriented to person, place, and time.     Motor: Weakness present.     Gait: Gait abnormal.     Comments: Platform walker  Psychiatric:        Mood and Affect: Mood normal.     Labs reviewed: Basic Metabolic Panel: Recent Labs    03/07/22 1014 03/08/22 1416 03/09/22 0439 09/04/22 0938 11/12/22 1915  11/15/22 0453 12/11/22 1459 02/04/23 1156  NA 140 137   < > 142   < > 137 143 141  K 2.5* 2.2*   < > 3.4*   < > 3.8 3.4* 3.8  CL 90* 89*   < > 101   < > 100 97* 98  CO2 35* 37*   < > 33*   < > 30 34* 30*  GLUCOSE 100* 111*   < > 101*   < > 98 86 97  BUN 43* 40*   < > 18   < > 15 14 12   CREATININE 1.15* 1.17*   < > 0.82   < > 0.74 0.76 0.74  CALCIUM 9.9 10.4*   < > 9.5   < > 8.5* 9.7 9.8  MG  --  2.1  --   --   --  2.0  --   --   TSH 1.02  --   --  0.08*  --   --   --   --    < > = values in this interval not displayed.   Liver Function Tests:  Recent Labs    03/09/22 0439 09/04/22 0938 11/12/22 1915 11/13/22 0624  AST 42* 32 40 30  ALT 27 15 17 16   ALKPHOS 57  --  79 71  BILITOT 1.1 1.4* 1.2 1.2  PROT 5.7* 6.2 5.6* 5.7*  ALBUMIN 2.9*  --  2.9* 2.8*   No results for input(s): "LIPASE", "AMYLASE" in the last 8760 hours. No results for input(s): "AMMONIA" in the last 8760 hours. CBC: Recent Labs    09/04/22 0938 11/12/22 1915 11/13/22 0624 11/15/22 0453  WBC 4.2 7.4 5.9 5.7  NEUTROABS 2,268 6.2  --  3.6  HGB 14.8 13.4 13.0 12.4  HCT 43.4 41.4 39.5 37.8  MCV 97.5 102.5* 100.5* 100.5*  PLT 117* 79* 73* 78*   Lipid Panel: Recent Labs    03/07/22 1014 09/04/22 0938 02/04/23 1156  CHOL 139 162 125  HDL 71 52 64  LDLCALC 52 87 44  TRIG 82 131 90  CHOLHDL 2.0 3.1 2.0   TSH: Recent Labs    03/07/22 1014 09/04/22 0938  TSH 1.02 0.08*   A1C: Lab Results  Component Value Date   HGBA1C 6.1 (H) 01/26/2015     Assessment/Plan 1. Dizziness - onset x 1 day - exam unremarkable - fall with head injury 10/2022 - 03/20 CT head negative for acute intracranial abnormality - she is on a high dose of torsemide for CHF - will check cbc/diff - advised to take BP BID x 2 weeks> bring to next f/u  - falls safety precautions discussed  - contact PCP if symptoms do not subside or worsen - CBC with Differential/Platelet  Total time: 31 minutes. Greater than 50% of  total time spent doing patient education regarding dizziness and falls safety.    Next appt: Visit date not found  Breannah Kratt Scherry Ran  [name] [phone]

## 2023-02-08 NOTE — Progress Notes (Signed)
   This service is provided via telemedicine  No vital signs collected/recorded due to the encounter was a telemedicine visit.   Location of patient (ex: home, work):  Home.  Patient consents to a telephone visit:  Yes  Location of the provider (ex: office, home):  [facility].  Name of any referring provider:  [name], [name], NP   Names of all persons participating in the telemedicine service and their role in the encounter:  Patient, Juvenal Umar, RMA, Amy Fargo, NP.    Time spent on call:  8 minutes spent on the phone with Medical Assistant.    

## 2023-02-25 DIAGNOSIS — S52512A Displaced fracture of left radial styloid process, initial encounter for closed fracture: Secondary | ICD-10-CM | POA: Diagnosis not present

## 2023-03-04 ENCOUNTER — Ambulatory Visit (INDEPENDENT_AMBULATORY_CARE_PROVIDER_SITE_OTHER): Payer: PPO | Admitting: Family

## 2023-03-04 ENCOUNTER — Encounter: Payer: Self-pay | Admitting: Family

## 2023-03-04 ENCOUNTER — Other Ambulatory Visit: Payer: Self-pay | Admitting: Family

## 2023-03-04 VITALS — BP 116/58 | HR 62 | Temp 97.6°F | Ht 65.5 in | Wt 180.6 lb

## 2023-03-04 DIAGNOSIS — N1831 Chronic kidney disease, stage 3a: Secondary | ICD-10-CM

## 2023-03-04 DIAGNOSIS — I110 Hypertensive heart disease with heart failure: Secondary | ICD-10-CM | POA: Diagnosis not present

## 2023-03-04 DIAGNOSIS — R102 Pelvic and perineal pain unspecified side: Secondary | ICD-10-CM

## 2023-03-04 DIAGNOSIS — I25118 Atherosclerotic heart disease of native coronary artery with other forms of angina pectoris: Secondary | ICD-10-CM

## 2023-03-04 DIAGNOSIS — I5032 Chronic diastolic (congestive) heart failure: Secondary | ICD-10-CM | POA: Diagnosis not present

## 2023-03-04 DIAGNOSIS — F411 Generalized anxiety disorder: Secondary | ICD-10-CM

## 2023-03-04 DIAGNOSIS — E039 Hypothyroidism, unspecified: Secondary | ICD-10-CM | POA: Diagnosis not present

## 2023-03-04 DIAGNOSIS — I495 Sick sinus syndrome: Secondary | ICD-10-CM

## 2023-03-04 DIAGNOSIS — B3731 Acute candidiasis of vulva and vagina: Secondary | ICD-10-CM

## 2023-03-04 DIAGNOSIS — I739 Peripheral vascular disease, unspecified: Secondary | ICD-10-CM | POA: Diagnosis not present

## 2023-03-04 DIAGNOSIS — I5033 Acute on chronic diastolic (congestive) heart failure: Secondary | ICD-10-CM

## 2023-03-04 MED ORDER — NYSTATIN 100000 UNIT/GM EX CREA: 1.0000 | TOPICAL_CREAM | Freq: Two times a day (BID) | CUTANEOUS | 0 refills | Status: DC

## 2023-03-04 NOTE — Progress Notes (Signed)
Provider: Richarda Blade FNP-C   Jill Preston, Jill Citrin, NP  Patient Care Team: Jill Preston, Jill Citrin, NP as PCP - General (Family Medicine) Croitoru, Rachelle Hora, MD as PCP - Cardiology (Cardiology) Jill Fair, MD as Consulting Physician (Cardiology) Jill Roberts, MD as Referring Physician (Dermatology) Jill Preston, DPM (Inactive) as Consulting Physician (Podiatry) Jill Preston, OD (Optometry) Jill Mylar, MD as Referring Physician (Gynecology)  Extended Emergency Contact Information Primary Emergency Contact: Jill Preston "Jill Preston of [place] Home Phone: [phone] Mobile Phone: [phone] Relation: Son Secondary Emergency Contact: [facility] Phone: [phone] Mobile Phone: [phone] Relation: Friend  Code Status:  Full Code  Goals of care: Advanced Directive information    02/08/2023    2:23 PM  Advanced Directives  Does Patient Have a Medical Advance Directive? Yes  Type of Advance Directive Out of facility DNR (pink MOST or yellow form)  Does patient want to make changes to medical advance directive? No - Patient declined     Chief Complaint  Patient presents with   Medical Management of Chronic Issues    Patient presents otday for a 6 month follow-up    HPI:  Pt is a 89 y.o. female seen today for  6 months follow up medical management of chronic diseases.    Hypertension - B/p runs 100/60 - 130's/70's HR 60's -80's.she denies any headache,dizziness,vision changes,fatigue,chest tightness,palpitation,chest pain or shortness of breath.      Hyperlipidemia - on Pravastatin does not exercise on a regular basis.No changes in her diet.   CHF - No abrupt weight changes. States legs have been swollen.Has had weight gain.she denies any She denies any cough,fatigue,chest tightness,chest pain,palpitation or shortness of breath.    Left knee pain - follows up with Orthopedic.worst with ambulation.   Past  Medical History:  Diagnosis Date   Arthritis    right knee   Cancer (HCC) yrs ago   skin cancer removed from face   Chronic diastolic CHF (congestive heart failure) (HCC)    Chronic venous insufficiency    LEA VENOUS, 10/17/2011 - mild reflux in bilateral common femoral veins   CKD (chronic kidney disease), stage III (HCC)    Coronary artery disease    a. s/p PCI/BMS to prox LAD and balloon angioplasty to mLAD with suboptimal result in 2000. b. Abnl nuc 2012, cath 09/2011 - showed that the overall territory of potential ischemia was small and attributable to a moderately diseased small second diagonal artery. Med rx.   Dyspnea    with activity   Headache    History of blood transfusion 2017   Hyperlipidemia    Hypertension    Hypertensive heart disease    Hypothyroidism    Obesity    Pneumonia    several times   Stroke [facility])    pt. states she had "light stroke" in sept. 1980   Urinary incontinence    Past Surgical History:  Procedure Laterality Date   ABDOMINAL HYSTERECTOMY  1970's   complete   ANKLE ARTHROSCOPY Right 07/10/2017   Procedure: ANKLE ARTHROSCOPY;  Surgeon: Jill Preston, DPM;  Location: [facility] OR;  Service: Podiatry;  Laterality: Right;   BACK SURGERY  07/26/2016   cervical neck surgery, [facility]   BALLOON DILATION N/A 12/01/2021   Procedure: BALLOON DILATION;  Surgeon: Jeani Hawking, MD;  Location: [facility] ENDOSCOPY;  Service: Gastroenterology;  Laterality: N/A;   BLADDER SURGERY  2010   WITH MESH  bladder tach   bunion removal surgery Bilateral 15 yrs ago   CARDIAC CATHETERIZATION Left 09/25/2011   Medical management   CARDIAC CATHETERIZATION Left 03/25/2001   Normal LV function, LAD residual narrowing of less than 10%, normal ramus intermediate, circumflex, and RCA,    CARDIAC CATHETERIZATION  09/04/1999   LAD, 3x6mm Tetra stent resulting in a reduction of the 80% stenosis to 0% residual   CARDIAC CATHETERIZATION N/A 01/26/2015   Procedure:  Right/Left Heart Cath and Coronary Angiography;  Surgeon: Lennette Bihari, MD;  Location: [facility] INVASIVE CV LAB;  Service: Cardiovascular;  Laterality: N/A;   CARDIAC CATHETERIZATION N/A 01/27/2015   Procedure: Intravascular Pressure Wire/FFR Study;  Surgeon: Kathleene Hazel, MD;  Location: [facility] INVASIVE CV LAB;  Service: Cardiovascular;  Laterality: N/A;   CARDIAC CATHETERIZATION N/A 01/27/2015   Procedure: Right Heart Cath;  Surgeon: Kathleene Hazel, MD;  Location: [facility] INVASIVE CV LAB;  Service: Cardiovascular;  Laterality: N/A;   CHOLECYSTECTOMY     COLONOSCOPY WITH PROPOFOL N/A 03/07/2017   Procedure: COLONOSCOPY WITH PROPOFOL;  Surgeon: Charna Elizabeth, MD;  Location: [facility];  Service: Endoscopy;  Laterality: N/A;   CORONARY PRESSURE/FFR STUDY N/A 04/05/2017   Procedure: Intravascular Pressure Wire/FFR Study;  Surgeon: Swaziland, Peter M, MD;  Location: [facility] INVASIVE CV LAB;  Service: Cardiovascular;  Laterality: N/A;   CORONARY STENT PLACEMENT     LAD x 1   ESOPHAGOGASTRODUODENOSCOPY (EGD) WITH PROPOFOL N/A 12/01/2021   Procedure: ESOPHAGOGASTRODUODENOSCOPY (EGD) WITH PROPOFOL;  Surgeon: Jeani Hawking, MD;  Location: [facility];  Service: Gastroenterology;  Laterality: N/A;   ganglion cyst removal  yrs ago   x 2   ILIAC VEIN ANGIOPLASTY / STENTING  02/15/2015   IVC FILTER INSERTION  2016   IVC FILTER REMOVAL  02/15/2015   at [facility]   LEFT HEART CATH AND CORONARY ANGIOGRAPHY N/A 04/05/2017   Procedure: Left Heart Cath and Coronary Angiography;  Surgeon: Swaziland, Peter M, MD;  Location: [facility] INVASIVE CV LAB;  Service: Cardiovascular;  Laterality: N/A;   LEFT HEART CATHETERIZATION WITH CORONARY ANGIOGRAM N/A 09/25/2011   Procedure: LEFT HEART CATHETERIZATION WITH CORONARY ANGIOGRAM;  Surgeon: Jill Fair, MD;  Location: [facility] CATH LAB;  Service: Cardiovascular;  Laterality: N/A;   multiple bladder surgeries to remove mesh     ROTATOR CUFF REPAIR Right    stent to groin Left 08/2014   left leg    TENDON REPAIR Right 07/10/2017   Procedure: RIGHT PERONEAL TENDON REPAIR;  Surgeon: Jill Preston, DPM;  Location: [facility] OR;  Service: Podiatry;  Laterality: Right;   TENDON REPAIR Right 05/27/2019   Procedure: PERONEAL TENDON REPAIR x2;  Surgeon: Jill Preston, DPM;  Location: [facility] ORS;  Service: Podiatry;  Laterality: Right;    Allergies  Allergen Reactions   Atorvastatin Anaphylaxis and Other (See Comments)    Patient does not remember; caused pneumonia- took her off of it per patient   Penicillins Anaphylaxis, Hives, Swelling and Other (See Comments)    Tongue swelling Did it involve swelling of the face/tongue/throat, SOB, or low BP? Yes Did it involve sudden or severe rash/hives, skin peeling, or any reaction on the inside of your mouth or nose? Yes Did you need to seek medical attention at a hospital or doctor's office? Yes When did it last happen?  66 or 89 years old    If all above answers are "NO", may proceed with cephalosporin use.    Shellfish Allergy Anaphylaxis, Nausea And Vomiting and Other (See Comments)  Severe nausea and vomiting   Aminophylline Itching, Swelling, Rash and Other (See Comments)    Pt experienced burning urination, itching, redness/rash, and swelling of genitals after given this medication via IV push.   Iodinated Contrast Media Swelling and Other (See Comments)    FLUSHING, also; LLE became swollen   Latex Other (See Comments)    Causes blisters   Oxybutynin Chloride Other (See Comments)    "blisters"   Propoxyphene Other (See Comments)    Unknown reaction   Vancomycin Hives and Other (See Comments)    06/02/2018- Hives arm, back and chest   Adhesive [Tape] Rash and Other (See Comments)    Paper tape is ok   Betadine [Povidone Iodine] Rash   Ciprofloxacin Hives   Codeine Nausea And Vomiting    .   Ranolazine Other (See Comments)    Constipation  .    Allergies as of 03/04/2023       Reactions   Atorvastatin Anaphylaxis, Other (See  Comments)   Patient does not remember; caused pneumonia- took her off of it per patient   Penicillins Anaphylaxis, Hives, Swelling, Other (See Comments)   Tongue swelling Did it involve swelling of the face/tongue/throat, SOB, or low BP? Yes Did it involve sudden or severe rash/hives, skin peeling, or any reaction on the inside of your mouth or nose? Yes Did you need to seek medical attention at a hospital or doctor's office? Yes When did it last happen?  87 or 89 years old    If all above answers are "NO", may proceed with cephalosporin use.   Shellfish Allergy Anaphylaxis, Nausea And Vomiting, Other (See Comments)   Severe nausea and vomiting   Aminophylline Itching, Swelling, Rash, Other (See Comments)   Pt experienced burning urination, itching, redness/rash, and swelling of genitals after given this medication via IV push.   Iodinated Contrast Media Swelling, Other (See Comments)   FLUSHING, also; LLE became swollen   Latex Other (See Comments)   Causes blisters   Oxybutynin Chloride Other (See Comments)   "blisters"   Propoxyphene Other (See Comments)   Unknown reaction   Vancomycin Hives, Other (See Comments)   06/02/2018- Hives arm, back and chest   Adhesive [tape] Rash, Other (See Comments)   Paper tape is ok   Betadine [povidone Iodine] Rash   Ciprofloxacin Hives   Codeine Nausea And Vomiting   .   Ranolazine Other (See Comments)   Constipation .        Medication List        Accurate as of March 04, 2023  1:19 PM. If you have any questions, ask your nurse or doctor.          ascorbic acid 500 MG tablet Commonly known as: VITAMIN C Take 500 mg by mouth daily.   cholecalciferol 25 MCG (1000 UNIT) tablet Commonly known as: VITAMIN D3 Take 1,000 Units by mouth daily.   ezetimibe 10 MG tablet Commonly known as: ZETIA Take 1 tablet (10 mg total) by mouth every morning.   isosorbide mononitrate 30 MG 24 hr tablet Commonly known as: IMDUR Take 1 tablet (30  mg total) by mouth 2 (two) times daily.   ketoconazole 2 % shampoo Commonly known as: NIZORAL Apply 1 application  topically daily as needed for irritation (or itching- SHAMPOO).   latanoprost 0.005 % ophthalmic solution Commonly known as: XALATAN Place 1 drop into both eyes at bedtime.   levothyroxine 88 MCG tablet Commonly known as: SYNTHROID TAKE ONE  TABLET BY MOUTH BEFORE BREAKFAST daily   OVER THE COUNTER MEDICATION Take 1 capsule by mouth daily. OmegaXL joint and muscle support   potassium chloride SA 20 MEQ tablet Commonly known as: KLOR-CON M TAKE ONE TABLET BY MOUTH TWICE DAILY   pravastatin 80 MG tablet Commonly known as: PRAVACHOL Take 80 mg by mouth at bedtime.   sertraline 25 MG tablet Commonly known as: ZOLOFT Take 1 tablet by mouth once daily.   torsemide 100 MG tablet Commonly known as: DEMADEX 195 lbs OR less ONE-HALF tab IN THE MORNING & IN THE EVENING. 196lbs OR higher ONE tab IN THE MORNING & ONE-HALF tab IN THE EVENING   Tylenol 8 Hour Arthritis Pain 650 MG CR tablet Generic drug: acetaminophen Take 650 mg by mouth every 8 (eight) hours as needed for pain.   VITAMIN B-12 PO Take 1 tablet by mouth daily.        Review of Systems  Constitutional:  Negative for appetite change, chills, fatigue, fever and unexpected weight change.  HENT:  Negative for congestion, dental problem, ear discharge, ear pain, facial swelling, hearing loss, nosebleeds, postnasal drip, rhinorrhea, sinus pressure, sinus pain, sneezing, sore throat, tinnitus and trouble swallowing.   Eyes:  Negative for pain, discharge, redness, itching and visual disturbance.  Respiratory:  Negative for cough, chest tightness, shortness of breath and wheezing.   Cardiovascular:  Negative for chest pain, palpitations and leg swelling.  Gastrointestinal:  Negative for abdominal distention, abdominal pain, blood in stool, constipation, diarrhea, nausea and vomiting.  Endocrine: Negative for  cold intolerance, heat intolerance, polydipsia, polyphagia and polyuria.  Genitourinary:  Positive for vaginal pain. Negative for difficulty urinating, dysuria, flank pain, urgency, vaginal bleeding and vaginal discharge.       Wears incontinent pull ups   Musculoskeletal:  Positive for arthralgias and gait problem. Negative for back pain, joint swelling, myalgias, neck pain and neck stiffness.       Left knee pain   Skin:  Negative for color change, pallor, rash and wound.  Neurological:  Negative for dizziness, syncope, speech difficulty, weakness, light-headedness, numbness and headaches.  Hematological:  Does not bruise/bleed easily.  Psychiatric/Behavioral:  Negative for agitation, behavioral problems, confusion, hallucinations, self-injury, sleep disturbance and suicidal ideas. The patient is not nervous/anxious.     Immunization History  Administered Date(s) Administered   PFIZER(Purple Top)SARS-COV-2 Vaccination 11/11/2019, 11/13/2019, 12/08/2019   Pneumococcal Conjugate-13 06/14/2014   Pneumococcal Polysaccharide-23 10/26/2013, 03/30/2020   Tdap 12/19/2015, 11/12/2022   Pertinent  Health Maintenance Due  Topic Date Due   DEXA SCAN  Completed   INFLUENZA VACCINE  Discontinued      10/25/2022    3:04 PM 11/01/2022    3:02 PM 02/08/2023   11:09 AM 02/08/2023    2:22 PM 03/04/2023    1:05 PM  Fall Risk  Falls in the past year? 1 1 1 1 1   Was there an injury with Fall? 1 1 0 0 1  Fall Risk Category Calculator 3 2 1 1 3   Patient at Risk for Falls Due to History of fall(s) History of fall(s) History of fall(s);Impaired balance/gait;Impaired mobility History of fall(s);Impaired balance/gait History of fall(s)  Fall risk Follow up Falls evaluation completed Falls evaluation completed Falls evaluation completed;Education provided;Falls prevention discussed Falls evaluation completed;Education provided;Falls prevention discussed Falls evaluation completed   Functional Status Survey:     Vitals:   03/04/23 1300  BP: (!) 116/58  Pulse: 62  Temp: 97.6 F (36.4 C)  SpO2: 96%  Weight: 180 lb 9.6 oz (81.9 kg)  Height: 5' 5.5" (1.664 m)   Body mass index is 29.6 kg/m. Physical Exam Vitals reviewed.  Constitutional:      General: She is not in acute distress.    Appearance: Normal appearance. She is overweight. She is not ill-appearing or diaphoretic.  HENT:     Head: Normocephalic.     Right Ear: Tympanic membrane, ear canal and external ear normal. There is no impacted cerumen.     Left Ear: Tympanic membrane, ear canal and external ear normal. There is no impacted cerumen.     Nose: Nose normal. No congestion or rhinorrhea.     Mouth/Throat:     Mouth: Mucous membranes are moist.     Pharynx: Oropharynx is clear. No oropharyngeal exudate or posterior oropharyngeal erythema.  Eyes:     General: No scleral icterus.       Right eye: No discharge.        Left eye: No discharge.     Extraocular Movements: Extraocular movements intact.     Conjunctiva/sclera: Conjunctivae normal.     Pupils: Pupils are equal, round, and reactive to light.  Neck:     Vascular: No carotid bruit.  Cardiovascular:     Rate and Rhythm: Normal rate and regular rhythm.     Pulses: Normal pulses.     Heart sounds: Normal heart sounds. No murmur heard.    No friction rub. No gallop.  Pulmonary:     Effort: Pulmonary effort is normal. No respiratory distress.     Breath sounds: Normal breath sounds. No wheezing, rhonchi or rales.  Chest:     Chest wall: No tenderness.  Abdominal:     General: Bowel sounds are normal. There is no distension.     Palpations: Abdomen is soft. There is no mass.     Tenderness: There is no abdominal tenderness. There is no right CVA tenderness, left CVA tenderness, guarding or rebound.  Musculoskeletal:        General: No swelling or tenderness. Normal range of motion.     Cervical back: Normal range of motion. No rigidity or tenderness.     Right lower  leg: Edema present.     Left lower leg: Edema present.     Comments: Ambulates with left forearm platform Rolling walker   Lymphadenopathy:     Cervical: No cervical adenopathy.  Skin:    General: Skin is warm and dry.     Coloration: Skin is not pale.     Findings: No bruising, lesion or rash.     Comments: Left abdominal fold beefy erythema tender to touch   Neurological:     Mental Status: She is alert and oriented to person, place, and time.     Cranial Nerves: No cranial nerve deficit.     Sensory: No sensory deficit.     Motor: No weakness.     Coordination: Coordination normal.     Gait: Gait abnormal.  Psychiatric:        Mood and Affect: Mood normal.        Speech: Speech normal.        Behavior: Behavior normal.        Thought Content: Thought content normal.        Judgment: Judgment normal.     Labs reviewed: Recent Labs    03/08/22 1416 03/09/22 0439 11/15/22 0453 12/11/22 1459 02/04/23 1156  NA 137   < > 137 143 141  K 2.2*   < > 3.8 3.4* 3.8  CL 89*   < > 100 97* 98  CO2 37*   < > 30 34* 30*  GLUCOSE 111*   < > 98 86 97  BUN 40*   < > 15 14 12   CREATININE 1.17*   < > 0.74 0.76 0.74  CALCIUM 10.4*   < > 8.5* 9.7 9.8  MG 2.1  --  2.0  --   --    < > = values in this interval not displayed.   Recent Labs    03/09/22 0439 09/04/22 0938 11/12/22 1915 11/13/22 0624  AST 42* 32 40 30  ALT 27 15 17 16   ALKPHOS 57  --  79 71  BILITOT 1.1 1.4* 1.2 1.2  PROT 5.7* 6.2 5.6* 5.7*  ALBUMIN 2.9*  --  2.9* 2.8*   Recent Labs    11/12/22 1915 11/13/22 0624 11/15/22 0453 02/08/23 1449  WBC 7.4 5.9 5.7 5.3  NEUTROABS 6.2  --  3.6 2,995  HGB 13.4 13.0 12.4 14.2  HCT 41.4 39.5 37.8 42.3  MCV 102.5* 100.5* 100.5* 97.2  PLT 79* 73* 78* 110*   Lab Results  Component Value Date   TSH 0.08 (L) 09/04/2022   Lab Results  Component Value Date   HGBA1C 6.1 (H) 01/26/2015   Lab Results  Component Value Date   CHOL 125 02/04/2023   HDL 64 02/04/2023    LDLCALC 44 02/04/2023   TRIG 90 02/04/2023   CHOLHDL 2.0 02/04/2023    Significant Diagnostic Results in last 30 days:  No results found.  Assessment/Plan 1. Hypertensive heart disease with chronic diastolic congestive heart failure (HCC) B/p well controlled  - continue on imdur and torsemide  - continue to monitor  - low salt intake  - Dietary modification and exercise as tolerated  2. Chronic diastolic CHF (congestive heart failure) (HCC) Has had weight gain with associated lower extremity edema. - advised to increase Torsemide to 100 mg tablet in the morning and afternoon x 3 days the resume previous dose. -monitor weight at home and notify provider for abrupt weight gain > 3 lbs  - low salt intake  - continue on Imdur   3. Coronary artery disease involving native coronary artery of native heart with other form of angina pectoris (HCC) Chest pain free - continue on Imdur and Pravastatin - continue to follow up with Cardiologist   4. Acquired hypothyroidism Lab Results  Component Value Date   TSH 0.08 (L) 09/04/2022  Continue on levothyroxine 75 mcg tablet daily on an empty stomach.will recheck TSH levels.  - TSH  5. Vaginal pain No bleeding or discharge reported.will refer to Gyn for evaluation. - Ambulatory referral to Gynecology  6. PAD (peripheral artery disease) (HCC) No ulceration  - continue to control high risk factors   7. Sick sinus syndrome (HCC) Stable  - continue to follow up with cardiology   8. Stage 3a chronic kidney disease (HCC) CR stable  - Will continue to avoid Nephrotoxins and dose all other medication for renal clearance   9. Candidiasis of genitalia in female Bilateral Abd folds beefy redness - advised to pat dry after showering  - start on Nystatin cream as below - nystatin cream (MYCOSTATIN); Apply 1 Application topically 2 (two) times daily.  Dispense: 30 g; Refill: 0  Family/ staff Communication: Reviewed plan of care with patient  verbalized understanding   Labs/tests ordered:   - TSH  Next Appointment :  Return in about 6 months (around 09/03/2023) for medical mangement of chronic issues.[name], NP

## 2023-03-04 NOTE — Patient Instructions (Signed)
-   Take Torsemide 100 mg tablet one table by mouth in the morning and 1/2 tablet in afternoon daily for 3 days then resume 1/2 tablet twice daily.

## 2023-03-05 LAB — TSH: TSH: 0.2 m[IU]/L — ABNORMAL LOW (ref 0.40–4.50)

## 2023-03-06 ENCOUNTER — Other Ambulatory Visit: Payer: Self-pay

## 2023-03-06 MED ORDER — LEVOTHYROXINE SODIUM 75 MCG PO TABS: 75.0000 ug | ORAL_TABLET | Freq: Every day | ORAL | Status: DC

## 2023-03-11 ENCOUNTER — Encounter: Payer: Self-pay | Admitting: Surgical

## 2023-03-11 ENCOUNTER — Ambulatory Visit (INDEPENDENT_AMBULATORY_CARE_PROVIDER_SITE_OTHER): Payer: PPO | Admitting: Surgical

## 2023-03-11 ENCOUNTER — Telehealth: Payer: Self-pay

## 2023-03-11 DIAGNOSIS — M79605 Pain in left leg: Secondary | ICD-10-CM

## 2023-03-11 NOTE — Progress Notes (Signed)
Follow-up Office Visit Note   Patient: Jill Preston           Date of Birth: 12-04-1932           MRN: [id number] Visit Date: 03/11/2023 Requested by: [name], NP [address] [place],  [place] [address] PCP: [name], NP  Subjective: Chief Complaint  Patient presents with   Left Knee - Follow-up    HPI: Jill Preston is a 89 y.o. female who returns to the office for follow-up visit.    Plan at last visit was: Patient is a 89 year old female who presents for evaluation of left knee pain. She states that she felt a pop in her knee several weeks ago which caused her to fall. She presents today with some continued knee pain but no more mechanical symptoms ever since the injury. She has small effusion but she has been ambulatory without severe difficulty. Radiographs taken today demonstrate no fracture or any acute injury. She does have some arthritic changes that do not seem significantly progressed compared with prior radiographs from about 2 years ago. After discussion of options, plan to try aspiration and injection today. 8 cc of nonpurulent synovial fluid was aspirated from the left knee and cortisone injection administered. We will try gel injection as well and we will see how she does when she returns for the gel injection.   Since then, patient notes she had some partial relief of her knee pain with left knee injection at her last visit that lasted for several weeks but did not last a great long while.  She states that she currently has a lot of pain in the anteromedial aspect of the left knee and radiates down her shin about two thirds the way down.  She has no associated radicular pain, numbness/tingling, groin pain.  No falls or injuries.  It is bothering her constantly at all times and even making it difficult to sleep.  She has pain at rest.  She takes vitamin D every day.  No history of osteoporosis that she can recall.              ROS: All systems reviewed  are negative as they relate to the chief complaint within the history of present illness.  Patient denies fevers or chills.  Assessment & Plan: Visit Diagnoses:  1. Pain in left leg     Plan: Jill Preston is a 89 y.o. female who returns to the office for follow-up visit for left knee pain.  Plan from last visit was noted above in HPI.  They now return with some relief from the prior left knee injection back in mid March but now her pain is worse and it is present at all times even at rest.  She is approved for Monovisc injection today but we will hold off on that and plan to order MRI of the left tib-fib region to further evaluate for stress reaction versus fracture of the left tibia.  If this is negative, plan to proceed with Monovisc injection.  We will call her with results.  Follow-Up Instructions: No follow-ups on file.   Orders:  Orders Placed This Encounter  Procedures   MR TIBIA FIBULA LEFT WO CONTRAST   No orders of the defined types were placed in this encounter.     Procedures: No procedures performed   Clinical Data: No additional findings.  Objective: Vital Signs: There were no vitals taken for this visit.  Physical  Exam:  Constitutional: Patient appears well-developed HEENT:  Head: Normocephalic Eyes:EOM are normal Neck: Normal range of motion Cardiovascular: Normal rate Pulmonary/chest: Effort normal Neurologic: Patient is alert Skin: Skin is warm Psychiatric: Patient has normal mood and affect  Ortho Exam: Ortho exam demonstrates left knee with no effusion.  Tender over the medial joint line moderately.  Tender more significantly over the proximal and midshaft of the tibia.  No bruising or cellulitis noted.  No pain with knee range of motion.  Range of motion from 0 degrees extension to 120 degrees of knee flexion.  No pain with hip range of motion.  Specialty Comments:  No specialty comments available.  Imaging: No results found.   PMFS  History: Patient Active Problem List   Diagnosis Date Noted   Class 2 severe obesity due to excess calories with serious comorbidity and body mass index (BMI) of 35.0 to 35.9 in adult [facility]) 03/04/2023   Concussion 11/13/2022   Head injury due to trauma 11/12/2022   Fall 10/25/2022   Hypokalemia 03/08/2022   Sick sinus syndrome (HCC) 03/07/2022   Hordeolum externum (stye) 05/16/2021   Chronic otitis media with serous effusion 05/16/2021   Thrombocytopenia (HCC) 04/17/2021   PAD (peripheral artery disease) (HCC) 08/01/2018   Herniated intervertebral disc of lumbar spine 06/02/2018   Bilateral leg pain 01/27/2017   Cervical spine degeneration 10/23/2015   Hypersomnolence 10/23/2015   S/P IVC filter 02/15/2015   Angina pectoris (HCC) 01/26/2015   CAD in native artery    Ischemic chest pain (HCC)    Edema of left lower extremity 09/16/2014   Acute deep vein thrombosis of left lower extremity (HCC) 09/14/2014   Anemia associated with acute blood loss 09/14/2014   Subclavian artery stenosis, left (HCC) 08/04/2014   Chronic diastolic CHF (congestive heart failure) (HCC) 07/16/2014   Essential hypertension 07/08/2014   CKD (chronic kidney disease), stage III (HCC)    Hypertensive heart disease    Chronic venous insufficiency    Metatarsal deformity 06/24/2013   CAD - LAD stent 12/00. Patent '02 and 1/13, moderate D2 managed medically 02/26/2013   Dyslipidemia 02/26/2013   DJD (degenerative joint disease)- back 02/26/2013   Asthma 08/06/2012   Hyperlipidemia 08/05/2012   Lichen sclerosus 06/03/2012   Rectocele 06/03/2012   Recurrent UTI 06/03/2012   Urge urinary incontinence 06/03/2012   Voiding dysfunction 06/03/2012   Hypothyroidism 07/23/2011   AKI (acute kidney injury) (HCC) 07/23/2011   Past Medical History:  Diagnosis Date   Arthritis    right knee   Cancer (HCC) yrs ago   skin cancer removed from face   Chronic diastolic CHF (congestive heart failure) (HCC)    Chronic  venous insufficiency    LEA VENOUS, 10/17/2011 - mild reflux in bilateral common femoral veins   CKD (chronic kidney disease), stage III (HCC)    Coronary artery disease    a. s/p PCI/BMS to prox LAD and balloon angioplasty to mLAD with suboptimal result in 2000. b. Abnl nuc 2012, cath 09/2011 - showed that the overall territory of potential ischemia was small and attributable to a moderately diseased small second diagonal artery. Med rx.   Dyspnea    with activity   Headache    History of blood transfusion 2017   Hyperlipidemia    Hypertension    Hypertensive heart disease    Hypothyroidism    Obesity    Pneumonia    several times   Stroke [facility])    pt. states she had "light  stroke" in sept. 1980   Urinary incontinence     Family History  Problem Relation Age of Onset   Cancer Mother    Heart disease Father    Heart disease Sister    High blood pressure Sister    Heart disease Sister    Cancer Sister    Heart disease Brother    Diabetes Brother    Brain cancer Brother    High blood pressure Brother    Heart disease Brother    Heart attack Brother    Heart disease Brother    High blood pressure Brother     Past Surgical History:  Procedure Laterality Date   ABDOMINAL HYSTERECTOMY  1970's   complete   ANKLE ARTHROSCOPY Right 07/10/2017   Procedure: ANKLE ARTHROSCOPY;  Surgeon: Park Liter, DPM;  Location: [facility] OR;  Service: Podiatry;  Laterality: Right;   BACK SURGERY  07/26/2016   cervical neck surgery, [facility]   BALLOON DILATION N/A 12/01/2021   Procedure: BALLOON DILATION;  Surgeon: Jeani Hawking, MD;  Location: [facility] ENDOSCOPY;  Service: Gastroenterology;  Laterality: N/A;   BLADDER SURGERY  2010   WITH MESH  bladder tach   bunion removal surgery Bilateral 15 yrs ago   CARDIAC CATHETERIZATION Left 09/25/2011   Medical management   CARDIAC CATHETERIZATION Left 03/25/2001   Normal LV function, LAD residual narrowing of less than 10%, normal ramus  intermediate, circumflex, and RCA,    CARDIAC CATHETERIZATION  09/04/1999   LAD, 3x40mm Tetra stent resulting in a reduction of the 80% stenosis to 0% residual   CARDIAC CATHETERIZATION N/A 01/26/2015   Procedure: Right/Left Heart Cath and Coronary Angiography;  Surgeon: Lennette Bihari, MD;  Location: [facility] INVASIVE CV LAB;  Service: Cardiovascular;  Laterality: N/A;   CARDIAC CATHETERIZATION N/A 01/27/2015   Procedure: Intravascular Pressure Wire/FFR Study;  Surgeon: Kathleene Hazel, MD;  Location: [facility] INVASIVE CV LAB;  Service: Cardiovascular;  Laterality: N/A;   CARDIAC CATHETERIZATION N/A 01/27/2015   Procedure: Right Heart Cath;  Surgeon: Kathleene Hazel, MD;  Location: [facility] INVASIVE CV LAB;  Service: Cardiovascular;  Laterality: N/A;   CHOLECYSTECTOMY     COLONOSCOPY WITH PROPOFOL N/A 03/07/2017   Procedure: COLONOSCOPY WITH PROPOFOL;  Surgeon: Charna Elizabeth, MD;  Location: [facility] END[facility];  Service: Endoscopy;  Laterality: N/A;   CORONARY PRESSURE/FFR STUDY N/A 04/05/2017   Procedure: Intravascular Pressure Wire/FFR Study;  Surgeon: Swaziland, Peter M, MD;  Location: [facility] INVASIVE CV LAB;  Service: Cardiovascular;  Laterality: N/A;   CORONARY STENT PLACEMENT     LAD x 1   ESOPHAGOGASTRODUODENOSCOPY (EGD) WITH PROPOFOL N/A 12/01/2021   Procedure: ESOPHAGOGASTRODUODENOSCOPY (EGD) WITH PROPOFOL;  Surgeon: Jeani Hawking, MD;  Location: [facility] END[facility];  Service: Gastroenterology;  Laterality: N/A;   ganglion cyst removal  yrs ago   x 2   ILIAC VEIN ANGIOPLASTY / STENTING  02/15/2015   IVC FILTER INSERTION  2016   IVC FILTER REMOVAL  02/15/2015   at [facility]   LEFT HEART CATH AND CORONARY ANGIOGRAPHY N/A 04/05/2017   Procedure: Left Heart Cath and Coronary Angiography;  Surgeon: Swaziland, Peter M, MD;  Location: [facility] INVASIVE CV LAB;  Service: Cardiovascular;  Laterality: N/A;   LEFT HEART CATHETERIZATION WITH CORONARY ANGIOGRAM N/A 09/25/2011   Procedure: LEFT HEART CATHETERIZATION WITH CORONARY ANGIOGRAM;   Surgeon: Thurmon Fair, MD;  Location: [facility] CATH LAB;  Service: Cardiovascular;  Laterality: N/A;   multiple bladder surgeries to remove mesh     ROTATOR CUFF REPAIR Right  stent to groin Left 08/2014   left leg   TENDON REPAIR Right 07/10/2017   Procedure: RIGHT PERONEAL TENDON REPAIR;  Surgeon: Park Liter, DPM;  Location: [facility] OR;  Service: Podiatry;  Laterality: Right;   TENDON REPAIR Right 05/27/2019   Procedure: PERONEAL TENDON REPAIR x2;  Surgeon: Park Liter, DPM;  Location: [facility] ORS;  Service: Podiatry;  Laterality: Right;   Social History   Occupational History   Not on file  Tobacco Use   Smoking status: Never   Smokeless tobacco: Never  Vaping Use   Vaping Use: Never used  Substance and Sexual Activity   Alcohol use: No   Drug use: No   Sexual activity: Not on file

## 2023-03-11 NOTE — Telephone Encounter (Signed)
Approved for Monovisc-left knee Buy and bill Covered @ 80% until OOP is met then covered at 100% No prior auth required

## 2023-03-28 ENCOUNTER — Ambulatory Visit (INDEPENDENT_AMBULATORY_CARE_PROVIDER_SITE_OTHER): Payer: PPO | Admitting: Family

## 2023-03-28 ENCOUNTER — Encounter: Payer: Self-pay | Admitting: Family

## 2023-03-28 VITALS — BP 122/58 | HR 66 | Temp 97.1°F | Ht 65.5 in | Wt 176.0 lb

## 2023-03-28 DIAGNOSIS — R1031 Right lower quadrant pain: Secondary | ICD-10-CM

## 2023-03-28 DIAGNOSIS — I110 Hypertensive heart disease with heart failure: Secondary | ICD-10-CM

## 2023-03-28 DIAGNOSIS — E039 Hypothyroidism, unspecified: Secondary | ICD-10-CM | POA: Diagnosis not present

## 2023-03-28 DIAGNOSIS — I5032 Chronic diastolic (congestive) heart failure: Secondary | ICD-10-CM | POA: Diagnosis not present

## 2023-03-28 DIAGNOSIS — E785 Hyperlipidemia, unspecified: Secondary | ICD-10-CM | POA: Diagnosis not present

## 2023-03-28 NOTE — Progress Notes (Signed)
Provider: Richarda Blade FNP-C   Lanaysia Fritchman, [name], NP  Patient Care Team: Rozanna Cormany, [name], NP as PCP - General (Family Medicine) Croitoru, Rachelle Hora, MD as PCP - Cardiology (Cardiology) Thurmon Fair, MD as Consulting Physician (Cardiology) Cherlyn Roberts, MD as Referring Physician (Dermatology) Park Liter, DPM (Inactive) as Consulting Physician (Podiatry) [place], [name], OD (Optometry) Jerrell Mylar, MD as Referring Physician (Gynecology)  Extended Emergency Contact Information Primary Emergency Contact: Tangney,[name] Home Phone: [phone] Mobile Phone: [phone] Relation: Son Secondary Emergency Contact: [facility] Phone: [phone] Mobile Phone: [phone] Relation: Friend  Code Status:  Full Code  Goals of care: Advanced Directive information    02/08/2023    2:23 PM  Advanced Directives  Does Patient Have a Medical Advance Directive? Yes  Type of Advance Directive Out of facility DNR (pink MOST or yellow form)  Does patient want to make changes to medical advance directive? No - Patient declined     Chief Complaint  Patient presents with   Acute Visit    Blood pressure concerns. High fall risk     HPI:  Pt is a 89 y.o. female seen today for follow up high blood pressure.states did not check B/p this morning.   States feels overwhelmed from caring for Husband who has dementia sometimes verbally abusive.Tries not to cry when he is present.usually does her puzzles,word book and needle work which helps her.  She complains of right groin pain.states worst when she sits down then tries to get up.unable to walk after getting up from sitting position.pain ongoing for several days.No weakness on right leg.Has previous injury to right ankle with numbness of toes.   States scheduled for MRI for left knee next Thursday 04/04/2023.she was seen by orthopedic after she heard a pop followed  by pain on her left knee.   Past Medical History:  Diagnosis Date   Arthritis    right knee   Cancer (HCC) yrs ago   skin cancer removed from face   Chronic diastolic CHF (congestive heart failure) (HCC)    Chronic venous insufficiency    LEA VENOUS, 10/17/2011 - mild reflux in bilateral common femoral veins   CKD (chronic kidney disease), stage III (HCC)    Coronary artery disease    a. s/p PCI/BMS to prox LAD and balloon angioplasty to mLAD with suboptimal result in 2000. b. Abnl nuc 2012, cath 09/2011 - showed that the overall territory of potential ischemia was small and attributable to a moderately diseased small second diagonal artery. Med rx.   Dyspnea    with activity   Headache    History of blood transfusion 2017   Hyperlipidemia    Hypertension    Hypertensive heart disease    Hypothyroidism    Obesity    Pneumonia    several times   Stroke [facility])    pt. states she had "light stroke" in sept. 1980   Urinary incontinence    Past Surgical History:  Procedure Laterality Date   ABDOMINAL HYSTERECTOMY  1970's   complete   ANKLE ARTHROSCOPY Right 07/10/2017   Procedure: ANKLE ARTHROSCOPY;  Surgeon: Park Liter, DPM;  Location: [facility] OR;  Service: Podiatry;  Laterality: Right;   BACK SURGERY  07/26/2016   cervical neck surgery, [facility]   BALLOON DILATION N/A 12/01/2021   Procedure: BALLOON DILATION;  Surgeon: Jeani Hawking, MD;  Location: [facility] ENDOSCOPY;  Service:  Gastroenterology;  Laterality: N/A;   BLADDER SURGERY  2010   WITH MESH  bladder tach   bunion removal surgery Bilateral 15 yrs ago   CARDIAC CATHETERIZATION Left 09/25/2011   Medical management   CARDIAC CATHETERIZATION Left 03/25/2001   Normal LV function, LAD residual narrowing of less than 10%, normal ramus intermediate, circumflex, and RCA,    CARDIAC CATHETERIZATION  09/04/1999   LAD, 3x72mm Tetra stent resulting in a reduction of the 80% stenosis to 0% residual   CARDIAC CATHETERIZATION  N/A 01/26/2015   Procedure: Right/Left Heart Cath and Coronary Angiography;  Surgeon: Lennette Bihari, MD;  Location: [facility] INVASIVE CV LAB;  Service: Cardiovascular;  Laterality: N/A;   CARDIAC CATHETERIZATION N/A 01/27/2015   Procedure: Intravascular Pressure Wire/FFR Study;  Surgeon: Kathleene Hazel, MD;  Location: [facility] INVASIVE CV LAB;  Service: Cardiovascular;  Laterality: N/A;   CARDIAC CATHETERIZATION N/A 01/27/2015   Procedure: Right Heart Cath;  Surgeon: Kathleene Hazel, MD;  Location: [facility] INVASIVE CV LAB;  Service: Cardiovascular;  Laterality: N/A;   CHOLECYSTECTOMY     COLONOSCOPY WITH PROPOFOL N/A 03/07/2017   Procedure: COLONOSCOPY WITH PROPOFOL;  Surgeon: Charna Elizabeth, MD;  Location: [facility] ENDOS[facility];  Service: Endoscopy;  Laterality: N/A;   CORONARY PRESSURE/FFR STUDY N/A 04/05/2017   Procedure: Intravascular Pressure Wire/FFR Study;  Surgeon: Swaziland, Peter M, MD;  Location: [facility] INVASIVE CV LAB;  Service: Cardiovascular;  Laterality: N/A;   CORONARY STENT PLACEMENT     LAD x 1   ESOPHAGOGASTRODUODENOSCOPY (EGD) WITH PROPOFOL N/A 12/01/2021   Procedure: ESOPHAGOGASTRODUODENOSCOPY (EGD) WITH PROPOFOL;  Surgeon: Jeani Hawking, MD;  Location: [facility];  Service: Gastroenterology;  Laterality: N/A;   ganglion cyst removal  yrs ago   x 2   ILIAC VEIN ANGIOPLASTY / STENTING  02/15/2015   IVC FILTER INSERTION  2016   IVC FILTER REMOVAL  02/15/2015   at [facility]   LEFT HEART CATH AND CORONARY ANGIOGRAPHY N/A 04/05/2017   Procedure: Left Heart Cath and Coronary Angiography;  Surgeon: Swaziland, Peter M, MD;  Location: [facility] INVASIVE CV LAB;  Service: Cardiovascular;  Laterality: N/A;   LEFT HEART CATHETERIZATION WITH CORONARY ANGIOGRAM N/A 09/25/2011   Procedure: LEFT HEART CATHETERIZATION WITH CORONARY ANGIOGRAM;  Surgeon: Thurmon Fair, MD;  Location: [facility] CATH LAB;  Service: Cardiovascular;  Laterality: N/A;   multiple bladder surgeries to remove mesh     ROTATOR CUFF REPAIR Right    stent to  groin Left 08/2014   left leg   TENDON REPAIR Right 07/10/2017   Procedure: RIGHT PERONEAL TENDON REPAIR;  Surgeon: Park Liter, DPM;  Location: [facility] OR;  Service: Podiatry;  Laterality: Right;   TENDON REPAIR Right 05/27/2019   Procedure: PERONEAL TENDON REPAIR x2;  Surgeon: Park Liter, DPM;  Location: [facility] ORS;  Service: Podiatry;  Laterality: Right;    Allergies  Allergen Reactions   Atorvastatin Anaphylaxis and Other (See Comments)    Patient does not remember; caused pneumonia- took her off of it per patient   Penicillins Anaphylaxis, Hives, Swelling and Other (See Comments)    Tongue swelling Did it involve swelling of the face/tongue/throat, SOB, or low BP? Yes Did it involve sudden or severe rash/hives, skin peeling, or any reaction on the inside of your mouth or nose? Yes Did you need to seek medical attention at a hospital or doctor's office? Yes When did it last happen?  34 or 88 years old    If all above answers are "NO", may proceed with  cephalosporin use.    Shellfish Allergy Anaphylaxis, Nausea And Vomiting and Other (See Comments)    Severe nausea and vomiting   Aminophylline Itching, Swelling, Rash and Other (See Comments)    Pt experienced burning urination, itching, redness/rash, and swelling of genitals after given this medication via IV push.   Iodinated Contrast Media Swelling and Other (See Comments)    FLUSHING, also; LLE became swollen   Latex Other (See Comments)    Causes blisters   Oxybutynin Chloride Other (See Comments)    "blisters"   Propoxyphene Other (See Comments)    Unknown reaction   Vancomycin Hives and Other (See Comments)    06/02/2018- Hives arm, back and chest   Adhesive [Tape] Rash and Other (See Comments)    Paper tape is ok   Betadine [Povidone Iodine] Rash   Ciprofloxacin Hives   Codeine Nausea And Vomiting    .   Ranolazine Other (See Comments)    Constipation  .    Allergies as of 03/28/2023       Reactions    Atorvastatin Anaphylaxis, Other (See Comments)   Patient does not remember; caused pneumonia- took her off of it per patient   Penicillins Anaphylaxis, Hives, Swelling, Other (See Comments)   Tongue swelling Did it involve swelling of the face/tongue/throat, SOB, or low BP? Yes Did it involve sudden or severe rash/hives, skin peeling, or any reaction on the inside of your mouth or nose? Yes Did you need to seek medical attention at a hospital or doctor's office? Yes When did it last happen?  67 or 89 years old    If all above answers are "NO", may proceed with cephalosporin use.   Shellfish Allergy Anaphylaxis, Nausea And Vomiting, Other (See Comments)   Severe nausea and vomiting   Aminophylline Itching, Swelling, Rash, Other (See Comments)   Pt experienced burning urination, itching, redness/rash, and swelling of genitals after given this medication via IV push.   Iodinated Contrast Media Swelling, Other (See Comments)   FLUSHING, also; LLE became swollen   Latex Other (See Comments)   Causes blisters   Oxybutynin Chloride Other (See Comments)   "blisters"   Propoxyphene Other (See Comments)   Unknown reaction   Vancomycin Hives, Other (See Comments)   06/02/2018- Hives arm, back and chest   Adhesive [tape] Rash, Other (See Comments)   Paper tape is ok   Betadine [povidone Iodine] Rash   Ciprofloxacin Hives   Codeine Nausea And Vomiting   .   Ranolazine Other (See Comments)   Constipation .        Medication List        Accurate as of March 28, 2023  2:13 PM. If you have any questions, ask your nurse or doctor.          ascorbic acid 500 MG tablet Commonly known as: VITAMIN C Take 500 mg by mouth daily.   cholecalciferol 25 MCG (1000 UNIT) tablet Commonly known as: VITAMIN D3 Take 1,000 Units by mouth daily.   ezetimibe 10 MG tablet Commonly known as: ZETIA Take 1 tablet (10 mg total) by mouth every morning.   isosorbide mononitrate 30 MG 24 hr  tablet Commonly known as: IMDUR Take 1 tablet (30 mg total) by mouth 2 (two) times daily.   ketoconazole 2 % shampoo Commonly known as: NIZORAL Apply 1 application  topically daily as needed for irritation (or itching- SHAMPOO).   latanoprost 0.005 % ophthalmic solution Commonly known as: XALATAN Place 1  drop into both eyes at bedtime.   levothyroxine 75 MCG tablet Commonly known as: SYNTHROID Take 1 tablet (75 mcg total) by mouth daily before breakfast.   nystatin cream Commonly known as: MYCOSTATIN Apply 1 Application topically 2 (two) times daily.   OVER THE COUNTER MEDICATION Take 1 capsule by mouth daily. OmegaXL joint and muscle support   potassium chloride SA 20 MEQ tablet Commonly known as: KLOR-CON M TAKE ONE TABLET BY MOUTH TWICE DAILY   pravastatin 80 MG tablet Commonly known as: PRAVACHOL Take 80 mg by mouth at bedtime.   sertraline 25 MG tablet Commonly known as: ZOLOFT TAKE ONE TABLET BY MOUTH ONCE DAILY   torsemide 100 MG tablet Commonly known as: DEMADEX 195 lbs OR less ONE-HALF tab IN THE MORNING & IN THE EVENING. 196lbs OR higher ONE tab IN THE MORNING & ONE-HALF tab IN THE EVENING   Tylenol 8 Hour Arthritis Pain 650 MG CR tablet Generic drug: acetaminophen Take 650 mg by mouth every 8 (eight) hours as needed for pain.   VITAMIN B-12 PO Take 1 tablet by mouth daily.        Review of Systems  Constitutional:  Negative for appetite change, chills, fatigue, fever and unexpected weight change.  Eyes:  Negative for pain, discharge, redness, itching and visual disturbance.  Respiratory:  Positive for shortness of breath. Negative for cough, chest tightness and wheezing.   Cardiovascular:  Negative for chest pain, palpitations and leg swelling.  Gastrointestinal:  Negative for abdominal distention, abdominal pain, blood in stool, constipation, diarrhea, nausea and vomiting.  Musculoskeletal:  Positive for arthralgias and gait problem. Negative for  back pain, joint swelling, myalgias, neck pain and neck stiffness.       Right groin pain  Left knee pain   Skin:  Negative for color change, pallor, rash and wound.  Neurological:  Negative for dizziness, speech difficulty, weakness, light-headedness and headaches.       Numbness on toes   Psychiatric/Behavioral:  Negative for agitation, behavioral problems, confusion, hallucinations and sleep disturbance. The patient is not nervous/anxious.        Care giver burden     Immunization History  Administered Date(s) Administered   PFIZER(Purple Top)SARS-COV-2 Vaccination 11/11/2019, 11/13/2019, 12/08/2019   Pneumococcal Conjugate-13 06/14/2014   Pneumococcal Polysaccharide-23 10/26/2013, 03/30/2020   Tdap 12/19/2015, 11/12/2022   Pertinent  Health Maintenance Due  Topic Date Due   DEXA SCAN  Completed   INFLUENZA VACCINE  Discontinued      11/01/2022    3:02 PM 02/08/2023   11:09 AM 02/08/2023    2:22 PM 03/04/2023    1:05 PM 03/28/2023    1:36 PM  Fall Risk  Falls in the past year? 1 1 1 1 1   Was there an injury with Fall? 1 0 0 1 1  Fall Risk Category Calculator 2 1 1 3 3   Patient at Risk for Falls Due to History of fall(s) History of fall(s);Impaired balance/gait;Impaired mobility History of fall(s);Impaired balance/gait History of fall(s) History of fall(s);Impaired balance/gait  Fall risk Follow up Falls evaluation completed Falls evaluation completed;Education provided;Falls prevention discussed Falls evaluation completed;Education provided;Falls prevention discussed Falls evaluation completed Falls evaluation completed   Functional Status Survey:    Vitals:   03/28/23 1335  BP: (!) 122/58  Pulse: 66  Temp: (!) 97.1 F (36.2 C)  TempSrc: Temporal  SpO2: 93%  Weight: 176 lb (79.8 kg)  Height: 5' 5.5" (1.664 m)   Body mass index is 28.84 kg/m. Physical  Exam Vitals reviewed.  Constitutional:      General: She is not in acute distress.    Appearance: Normal  appearance. She is normal weight. She is not ill-appearing or diaphoretic.  HENT:     Head: Normocephalic.     Right Ear: Tympanic membrane, ear canal and external ear normal. There is no impacted cerumen.     Left Ear: Tympanic membrane, ear canal and external ear normal. There is no impacted cerumen.     Nose: Nose normal. No congestion or rhinorrhea.     Mouth/Throat:     Mouth: Mucous membranes are moist.     Pharynx: Oropharynx is clear. No oropharyngeal exudate or posterior oropharyngeal erythema.  Eyes:     General: No scleral icterus.       Right eye: No discharge.        Left eye: No discharge.     Extraocular Movements: Extraocular movements intact.     Conjunctiva/sclera: Conjunctivae normal.     Pupils: Pupils are equal, round, and reactive to light.  Neck:     Vascular: No carotid bruit.  Cardiovascular:     Rate and Rhythm: Normal rate and regular rhythm.     Pulses: Normal pulses.     Heart sounds: Normal heart sounds. No murmur heard.    No friction rub. No gallop.  Pulmonary:     Effort: Pulmonary effort is normal. No respiratory distress.     Breath sounds: Normal breath sounds. No wheezing, rhonchi or rales.  Chest:     Chest wall: No tenderness.  Abdominal:     General: Bowel sounds are normal. There is no distension.     Palpations: Abdomen is soft. There is no mass.     Tenderness: There is no abdominal tenderness. There is no right CVA tenderness, left CVA tenderness, guarding or rebound.  Musculoskeletal:        General: No swelling. Normal range of motion.     Cervical back: Normal range of motion. No rigidity or tenderness.     Right knee: Normal.     Left knee: No swelling, effusion, erythema, ecchymosis or crepitus. Normal range of motion. Tenderness present over the lateral joint line.     Right lower leg: No edema.     Left lower leg: No edema.     Comments: Right groin area tenderness with difficult ambulation   Lymphadenopathy:     Cervical: No  cervical adenopathy.  Skin:    General: Skin is warm and dry.     Coloration: Skin is not pale.     Findings: No bruising, erythema, lesion or rash.  Neurological:     Mental Status: She is alert and oriented to person, place, and time.     Cranial Nerves: No cranial nerve deficit.     Sensory: No sensory deficit.     Motor: No weakness.     Coordination: Coordination normal.     Gait: Gait abnormal.  Psychiatric:        Mood and Affect: Mood normal.        Speech: Speech normal.        Behavior: Behavior normal.        Thought Content: Thought content normal.        Judgment: Judgment normal.     Labs reviewed: Recent Labs    11/15/22 0453 12/11/22 1459 02/04/23 1156  NA 137 143 141  K 3.8 3.4* 3.8  CL 100 97* 98  CO2 30  34* 30*  GLUCOSE 98 86 97  BUN 15 14 12   CREATININE 0.74 0.76 0.74  CALCIUM 8.5* 9.7 9.8  MG 2.0  --   --    Recent Labs    09/04/22 0938 11/12/22 1915 11/13/22 0624  AST 32 40 30  ALT 15 17 16   ALKPHOS  --  79 71  BILITOT 1.4* 1.2 1.2  PROT 6.2 5.6* 5.7*  ALBUMIN  --  2.9* 2.8*   Recent Labs    11/12/22 1915 11/13/22 0624 11/15/22 0453 02/08/23 1449  WBC 7.4 5.9 5.7 5.3  NEUTROABS 6.2  --  3.6 2,995  HGB 13.4 13.0 12.4 14.2  HCT 41.4 39.5 37.8 42.3  MCV 102.5* 100.5* 100.5* 97.2  PLT 79* 73* 78* 110*   Lab Results  Component Value Date   TSH 0.20 (L) 03/04/2023   Lab Results  Component Value Date   HGBA1C 6.1 (H) 01/26/2015   Lab Results  Component Value Date   CHOL 125 02/04/2023   HDL 64 02/04/2023   LDLCALC 44 02/04/2023   TRIG 90 02/04/2023   CHOLHDL 2.0 02/04/2023    Significant Diagnostic Results in last 30 days:  No results found.  Assessment/Plan  1. Hypertensive heart disease with chronic diastolic congestive heart failure (HCC) B/p well controlled this visit seems to be related to care giver burden caring for Husband who has dementia has been verbally abusive.   - TSH; Future - COMPLETE METABOLIC PANEL  WITH GFR; Future - CBC with Differential/Platelet; Future  2. Chronic diastolic CHF (congestive heart failure) (HCC) No signs of fluid overload -Continue on torsemide and potassium chloride -Continue on Imdur - COMPLETE METABOLIC PANEL WITH GFR; Future - CBC with Differential/Platelet; Future  3. Acquired hypothyroidism Lab Results  Component Value Date   TSH 0.20 (L) 03/04/2023   - COMPLETE METABOLIC PANEL WITH GFR; Future - CBC with Differential/Platelet; Future  4. Dyslipidemia Has improved -Continue on pravastatin -Dietary modification and exercise as tolerated - Lipid panel; Future  5. Right groin pain New onset of groin pain for the past 2 to 3 weeks worse with getting up from sitting to standing position.  Will obtain imaging -Continue on over-the-counter analgesic as needed - Please get right hip and pelvic X-ray at [facility] imaging at [address] [facility] then will call you with results. - DG HIP UNILAT W OR W/O PELVIS 2-3 VIEWS RIGHT; Future  Family/ staff Communication: Reviewed plan of care with patient verbalized understanding  Labs/tests ordered:  - DG HIP UNILAT W OR W/O PELVIS 2-3 VIEWS RIGHT; Future  Next Appointment : Return in about 5 months (around 08/28/2023) for Fasting labs in 6 months prior to visit.   [name], NP

## 2023-03-29 DIAGNOSIS — S52512A Displaced fracture of left radial styloid process, initial encounter for closed fracture: Secondary | ICD-10-CM | POA: Diagnosis not present

## 2023-03-29 DIAGNOSIS — R200 Anesthesia of skin: Secondary | ICD-10-CM | POA: Diagnosis not present

## 2023-03-29 DIAGNOSIS — R202 Paresthesia of skin: Secondary | ICD-10-CM | POA: Diagnosis not present

## 2023-04-04 ENCOUNTER — Other Ambulatory Visit: Payer: PPO

## 2023-04-04 ENCOUNTER — Ambulatory Visit
Admission: RE | Admit: 2023-04-04 | Discharge: 2023-04-04 | Disposition: A | Discharge status: Home / Self Care | Payer: PPO | Source: Ambulatory Visit | Attending: Family | Admitting: Family

## 2023-04-04 DIAGNOSIS — R1031 Right lower quadrant pain: Secondary | ICD-10-CM

## 2023-04-04 DIAGNOSIS — M25562 Pain in left knee: Secondary | ICD-10-CM | POA: Diagnosis not present

## 2023-04-04 DIAGNOSIS — M79662 Pain in left lower leg: Secondary | ICD-10-CM | POA: Diagnosis not present

## 2023-04-04 DIAGNOSIS — M79605 Pain in left leg: Secondary | ICD-10-CM

## 2023-04-04 DIAGNOSIS — M1611 Unilateral primary osteoarthritis, right hip: Secondary | ICD-10-CM | POA: Diagnosis not present

## 2023-04-04 DIAGNOSIS — R600 Localized edema: Secondary | ICD-10-CM | POA: Diagnosis not present

## 2023-04-09 ENCOUNTER — Other Ambulatory Visit: Payer: Self-pay | Admitting: Family

## 2023-04-09 ENCOUNTER — Telehealth: Payer: Self-pay

## 2023-04-09 DIAGNOSIS — E876 Hypokalemia: Secondary | ICD-10-CM | POA: Diagnosis not present

## 2023-04-09 DIAGNOSIS — F411 Generalized anxiety disorder: Secondary | ICD-10-CM

## 2023-04-09 DIAGNOSIS — I5032 Chronic diastolic (congestive) heart failure: Secondary | ICD-10-CM | POA: Diagnosis not present

## 2023-04-09 DIAGNOSIS — Z6829 Body mass index (BMI) 29.0-29.9, adult: Secondary | ICD-10-CM | POA: Diagnosis not present

## 2023-04-09 DIAGNOSIS — Z515 Encounter for palliative care: Secondary | ICD-10-CM | POA: Diagnosis not present

## 2023-04-09 DIAGNOSIS — N1831 Chronic kidney disease, stage 3a: Secondary | ICD-10-CM | POA: Diagnosis not present

## 2023-04-09 MED ORDER — LEVOTHYROXINE SODIUM 75 MCG PO TABS: 75.0000 ug | ORAL_TABLET | Freq: Every day | ORAL | 1 refills | Status: DC

## 2023-04-09 NOTE — Telephone Encounter (Signed)
Noted  

## 2023-04-09 NOTE — Telephone Encounter (Signed)
Tonya with [facility] called to request verbal orders for physical therapy due to pain in right lower extremity that patient expresses as a 10/10 (per [place]). Patient is awaiting radiology results on scan performed on 04/04/23 and is aware that the radiology department said it can take 7-10 days before results are available.   Verbal orders were given as requested to evaluate and treat for physical therapy  per [facility] standing protocol. Message forwarded to [name], [name], NP as a Lorain Childes

## 2023-04-15 ENCOUNTER — Telehealth: Payer: Self-pay

## 2023-04-15 NOTE — Telephone Encounter (Signed)
Please schedule OV for patient to review scan.

## 2023-04-15 NOTE — Telephone Encounter (Signed)
-----   Message from [facility] sent at 04/15/2023 10:57 AM EDT ----- Need follow-up appointment, not urgent

## 2023-04-15 NOTE — Progress Notes (Signed)
Need follow-up appointment, not urgent

## 2023-04-18 ENCOUNTER — Telehealth: Payer: Self-pay

## 2023-04-18 NOTE — Telephone Encounter (Signed)
Schedule lab appointment to recheck TSH level and evaluate low Heart rate

## 2023-04-18 NOTE — Telephone Encounter (Signed)
Jill Preston with [facility] called to report low heart rate. She stated that patient's heart rate was between 44-45. She also stated that patients oxygen was between 94%-95%. She states that oxygen level is usually between 96%-99%.   Message sent to Richarda Blade, NP

## 2023-04-19 NOTE — Telephone Encounter (Signed)
Outgoing call placed to patient,but no answer. I left message for patient to call office when available.

## 2023-04-22 ENCOUNTER — Other Ambulatory Visit: Payer: Self-pay

## 2023-04-22 DIAGNOSIS — I110 Hypertensive heart disease with heart failure: Secondary | ICD-10-CM

## 2023-04-22 NOTE — Telephone Encounter (Signed)
Patient scheduled for TSH follow up lab work. Patient has appointment for 04/23/23.

## 2023-04-23 ENCOUNTER — Other Ambulatory Visit: Payer: PPO

## 2023-04-23 DIAGNOSIS — I5032 Chronic diastolic (congestive) heart failure: Secondary | ICD-10-CM

## 2023-04-23 DIAGNOSIS — E039 Hypothyroidism, unspecified: Secondary | ICD-10-CM

## 2023-04-23 DIAGNOSIS — I110 Hypertensive heart disease with heart failure: Secondary | ICD-10-CM

## 2023-04-23 DIAGNOSIS — E785 Hyperlipidemia, unspecified: Secondary | ICD-10-CM | POA: Diagnosis not present

## 2023-04-24 ENCOUNTER — Ambulatory Visit: Payer: PPO | Attending: Cardiovascular Disease | Admitting: Cardiovascular Disease

## 2023-04-24 ENCOUNTER — Encounter: Payer: Self-pay | Admitting: Cardiovascular Disease

## 2023-04-24 VITALS — BP 132/52 | HR 72 | Ht 65.0 in | Wt 177.2 lb

## 2023-04-24 DIAGNOSIS — R072 Precordial pain: Secondary | ICD-10-CM

## 2023-04-24 DIAGNOSIS — E663 Overweight: Secondary | ICD-10-CM | POA: Diagnosis not present

## 2023-04-24 DIAGNOSIS — I5032 Chronic diastolic (congestive) heart failure: Secondary | ICD-10-CM

## 2023-04-24 DIAGNOSIS — I25118 Atherosclerotic heart disease of native coronary artery with other forms of angina pectoris: Secondary | ICD-10-CM | POA: Diagnosis not present

## 2023-04-24 DIAGNOSIS — E782 Mixed hyperlipidemia: Secondary | ICD-10-CM

## 2023-04-24 DIAGNOSIS — I10 Essential (primary) hypertension: Secondary | ICD-10-CM

## 2023-04-24 MED ORDER — TORSEMIDE 100 MG PO TABS: 50.0000 mg | ORAL_TABLET | Freq: Every day | ORAL | 3 refills | Status: DC

## 2023-04-24 MED ORDER — ISOSORBIDE MONONITRATE ER 30 MG PO TB24: 30.0000 mg | ORAL_TABLET | Freq: Every day | ORAL | Status: DC

## 2023-04-24 NOTE — Patient Instructions (Signed)
Medication Instructions:  Your physician has recommended you make the following change in your medication:  1) DECREASE Imdur (isosorbide) to once daily - if after one week you do not have any reoccurrence of chest pain you can discontinue.  2) DECREASE torsemide to 50 mg daily  *If you need a refill on your cardiac medications before your next appointment, please call your pharmacy*  Lab Work: IN THREE WEEKS: BMET and BNP  No appointment needed, lab here at the office is open Monday-Friday from 8AM to 4PM and closed daily for lunch from 12:45-1:45.    Follow-Up: At [facility], you and your health needs are our priority.  As part of our continuing mission to provide you with exceptional heart care, we have created designated Provider Care Teams.  These Care Teams include your primary Cardiologist (physician) and Advanced Practice Providers (APPs -  Physician Assistants and Nurse Practitioners) who all work together to provide you with the care you need, when you need it.   Your next appointment:   6 months  Provider:   Thurmon Fair, MD

## 2023-04-24 NOTE — Progress Notes (Signed)
Patient ID: AVO VOCI, female   DOB: [birth year], 89 y.o.   MRN: [id number]    Cardiology Office Note    Date:  04/27/2023   ID:  Jill Preston, DOB [birth year], MRN [id number]  PCP:  [name], NP  Cardiologist:   Thurmon Fair, MD   Chief Complaint  Patient presents with   Congestive Heart Failure     History of Present Illness:  Jill Preston is a 89 y.o. female returning in follow-up for CAD and CHF.  Has a moderate stenosis of the left main coronary artery, not  hemodynamically significant by FFR analysis in 2016 and again in 2018.  She has chronic diastolic heart failure that responds well to diuretics, but excessive diuresis has occasionally led to acute kidney injury and severe hypokalemia.  She is doing well from a cardiac symptom point of view, but is less physically active.  She hit her head and had a concussion and required 8 staples to her scalp.  She also had fractures in her left scapular and left radius, managed conservatively.  She has had a lot of pain in her right leg, so bad that it is hard for her to move and she has been using a wheelchair.  She has had x-rays that were reportedly okay, planning for a visit with orthopedics and possible MRI later this week.  It was felt that her fall was related to "soft blood pressure" so amlodipine, spironolactone were stopped.  She is lost weight and now only weighs 177 pounds, almost 10 pounds less than at her previous appointment.  She has been taking torsemide 50 mg only once daily and only as needed when she develops swelling, this happens about 3 times a week.  She has not had orthopnea or PND.  She does not have any problems with skin ulcers or weeping from the skin.  She has not had any chest pain at rest or with activities.  She remains under a lot of emotional and physical stress due to her husband's dementia.  He is up throughout most of the night.  He talks to himself in the mirror at night.  Recent  metabolic parameters from 02/04/2023 look great with an HDL of 64 and LDL of 44.  Creatinine was 0.74 and potassium was normal at 3.8.  TSH was a little suppressed in June, but is in normal range as of yesterday.  Her biggest problem remains the stress surrounding caring for her husband Jill Preston and his progressive dementia.  This has been compounded by recurrent falls, spinal fracture, recent prolonged hospitalization with urinary tract infection related sepsis, DVT/PE and development of sacral ulcers with a prolonged stay in a nursing home.  He is now back home  .  She has a long history of coronary disease. She underwent proximal LAD bare-metal stenting and balloon angioplasty of the mid LAD in 2013. She has had persistent ischemia in the territory of the diagonal artery on nuclear stress test performed over the last few years. Coronary angiography performed May 2016 showed a widely patent LAD stent, 60% ostial circumflex stenosis, 20-30% right coronary artery stenosis. There was concern about possible left coronary artery stenosis but fractional flow reserve was normal (0.96 at baseline, 0.91 during intravenous adenosine infusion). She had good anginal response to Ranexa but developed severe constipation and could not tolerate the medication. She has been intolerant to beta blockers due to bradycardia. She has preserved left ventricular systolic function but has had episodes  of acute exacerbation of diastolic heart failure attributed to hypertensive heart disease. December 2015, she was critically ill with an acute left iliofemoral DVT with anticoagulation complicated by retroperitoneal hematoma and hemorrhagic shock, requiring placement of an inferior vena cava filter. The filter was removed in June 2016. Coronary angiography in July 2018 showed unchanged anatomy of the eccentric stenosis in the left main coronary artery with noncritical FFR (0.89 in the LAD, 0.86 in the LCx).  Intravascular Pressure  Wire/FFR Study  Left Heart Cath and Coronary Angiography  Conclusion     LM lesion, 70 %stenosed. Mid LAD lesion, 0 %stenosed at site of prior stent. Ost Cx lesion, 60 %stenosed. Prox RCA-1 lesion, 20 %stenosed. Prox RCA-2 lesion, 30 %stenosed. The left ventricular systolic function is normal. LV end diastolic pressure is normal. The left ventricular ejection fraction is 55-65% by visual estimate.   1. 70% eccentric distal left main stenosis. Angiographically similar to 2016. FFR 0.89 into the LAD and 0.86 into the LCx suggesting that this lesion is not flow limiting.  2. Otherwise nonobstructive CAD. Patent stent in LAD. 3. Normal LV function 4. Normal LVEDP   Plan: recommend continued medical therapy.     Past Medical History:  Diagnosis Date   Arthritis    right knee   Cancer (HCC) yrs ago   skin cancer removed from face   Chronic diastolic CHF (congestive heart failure) (HCC)    Chronic venous insufficiency    LEA VENOUS, 10/17/2011 - mild reflux in bilateral common femoral veins   CKD (chronic kidney disease), stage III (HCC)    Coronary artery disease    a. s/p PCI/BMS to prox LAD and balloon angioplasty to mLAD with suboptimal result in 2000. b. Abnl nuc 2012, cath 09/2011 - showed that the overall territory of potential ischemia was small and attributable to a moderately diseased small second diagonal artery. Med rx.   Dyspnea    with activity   Headache    History of blood transfusion 2017   Hyperlipidemia    Hypertension    Hypertensive heart disease    Hypothyroidism    Obesity    Pneumonia    several times   Stroke [facility])    pt. states she had "light stroke" in sept. 1980   Urinary incontinence     Past Surgical History:  Procedure Laterality Date   ABDOMINAL HYSTERECTOMY  1970's   complete   ANKLE ARTHROSCOPY Right 07/10/2017   Procedure: ANKLE ARTHROSCOPY;  Surgeon: Park Liter, DPM;  Location: [facility] OR;  Service: Podiatry;  Laterality: Right;    BACK SURGERY  07/26/2016   cervical neck surgery, [facility]   BALLOON DILATION N/A 12/01/2021   Procedure: BALLOON DILATION;  Surgeon: Jeani Hawking, MD;  Location: [facility];  Service: Gastroenterology;  Laterality: N/A;   BLADDER SURGERY  2010   WITH MESH  bladder tach   bunion removal surgery Bilateral 15 yrs ago   CARDIAC CATHETERIZATION Left 09/25/2011   Medical management   CARDIAC CATHETERIZATION Left 03/25/2001   Normal LV function, LAD residual narrowing of less than 10%, normal ramus intermediate, circumflex, and RCA,    CARDIAC CATHETERIZATION  09/04/1999   LAD, 3x21mm Tetra stent resulting in a reduction of the 80% stenosis to 0% residual   CARDIAC CATHETERIZATION N/A 01/26/2015   Procedure: Right/Left Heart Cath and Coronary Angiography;  Surgeon: Lennette Bihari, MD;  Location: [facility] INVASIVE CV LAB;  Service: Cardiovascular;  Laterality: N/A;   CARDIAC CATHETERIZATION N/A  01/27/2015   Procedure: Intravascular Pressure Wire/FFR Study;  Surgeon: Kathleene Hazel, MD;  Location: [facility] INVASIVE CV LAB;  Service: Cardiovascular;  Laterality: N/A;   CARDIAC CATHETERIZATION N/A 01/27/2015   Procedure: Right Heart Cath;  Surgeon: Kathleene Hazel, MD;  Location: [facility] INVASIVE CV LAB;  Service: Cardiovascular;  Laterality: N/A;   CHOLECYSTECTOMY     COLONOSCOPY WITH PROPOFOL N/A 03/07/2017   Procedure: COLONOSCOPY WITH PROPOFOL;  Surgeon: Charna Elizabeth, MD;  Location: [facility];  Service: Endoscopy;  Laterality: N/A;   CORONARY PRESSURE/FFR STUDY N/A 04/05/2017   Procedure: Intravascular Pressure Wire/FFR Study;  Surgeon: Swaziland, Peter M, MD;  Location: [facility] INVASIVE CV LAB;  Service: Cardiovascular;  Laterality: N/A;   CORONARY STENT PLACEMENT     LAD x 1   ESOPHAGOGASTRODUODENOSCOPY (EGD) WITH PROPOFOL N/A 12/01/2021   Procedure: ESOPHAGOGASTRODUODENOSCOPY (EGD) WITH PROPOFOL;  Surgeon: Jeani Hawking, MD;  Location: [facility];  Service: Gastroenterology;  Laterality:  N/A;   ganglion cyst removal  yrs ago   x 2   ILIAC VEIN ANGIOPLASTY / STENTING  02/15/2015   IVC FILTER INSERTION  2016   IVC FILTER REMOVAL  02/15/2015   at [facility]   LEFT HEART CATH AND CORONARY ANGIOGRAPHY N/A 04/05/2017   Procedure: Left Heart Cath and Coronary Angiography;  Surgeon: Swaziland, Peter M, MD;  Location: [facility] INVASIVE CV LAB;  Service: Cardiovascular;  Laterality: N/A;   LEFT HEART CATHETERIZATION WITH CORONARY ANGIOGRAM N/A 09/25/2011   Procedure: LEFT HEART CATHETERIZATION WITH CORONARY ANGIOGRAM;  Surgeon: Thurmon Fair, MD;  Location: [facility] CATH LAB;  Service: Cardiovascular;  Laterality: N/A;   multiple bladder surgeries to remove mesh     ROTATOR CUFF REPAIR Right    stent to groin Left 08/2014   left leg   TENDON REPAIR Right 07/10/2017   Procedure: RIGHT PERONEAL TENDON REPAIR;  Surgeon: Park Liter, DPM;  Location: [facility] OR;  Service: Podiatry;  Laterality: Right;   TENDON REPAIR Right 05/27/2019   Procedure: PERONEAL TENDON REPAIR x2;  Surgeon: Park Liter, DPM;  Location: [facility] ORS;  Service: Podiatry;  Laterality: Right;    Outpatient Medications Prior to Visit  Medication Sig Dispense Refill   cholecalciferol (VITAMIN D3) 25 MCG (1000 UNIT) tablet Take 1,000 Units by mouth daily.      Cyanocobalamin (VITAMIN B-12 PO) Take 1 tablet by mouth daily.     ezetimibe (ZETIA) 10 MG tablet Take 1 tablet (10 mg total) by mouth every morning. 90 tablet 3   ketoconazole (NIZORAL) 2 % shampoo Apply 1 application  topically daily as needed for irritation (or itching- SHAMPOO).     latanoprost (XALATAN) 0.005 % ophthalmic solution Place 1 drop into both eyes at bedtime.     levothyroxine (SYNTHROID) 75 MCG tablet Take 1 tablet (75 mcg total) by mouth daily before breakfast. 90 tablet 1   nystatin cream (MYCOSTATIN) Apply 1 Application topically 2 (two) times daily. 30 g 0   OVER THE COUNTER MEDICATION Take 1 capsule by mouth daily. OmegaXL joint and muscle support     potassium  chloride SA (KLOR-CON M) 20 MEQ tablet TAKE ONE TABLET BY MOUTH TWICE DAILY 180 tablet 3   pravastatin (PRAVACHOL) 80 MG tablet Take 80 mg by mouth at bedtime.     sertraline (ZOLOFT) 25 MG tablet TAKE ONE TABLET BY MOUTH ONCE DAILY 90 tablet 1   TYLENOL 8 HOUR ARTHRITIS PAIN 650 MG CR tablet Take 650 mg by mouth every 8 (eight) hours as needed for  pain.     vitamin C (ASCORBIC ACID) 500 MG tablet Take 500 mg by mouth daily.     isosorbide mononitrate (IMDUR) 30 MG 24 hr tablet Take 1 tablet (30 mg total) by mouth 2 (two) times daily. 180 tablet 3   torsemide (DEMADEX) 100 MG tablet 195 lbs OR less ONE-HALF tab IN THE MORNING & IN THE EVENING. 196lbs OR higher ONE tab IN THE MORNING & ONE-HALF tab IN THE EVENING 45 tablet 6   No facility-administered medications prior to visit.     Allergies:   Atorvastatin, Penicillins, Shellfish allergy, Aminophylline, Iodinated contrast media, Latex, Oxybutynin chloride, Propoxyphene, Vancomycin, Adhesive [tape], Betadine [povidone iodine], Ciprofloxacin, Codeine, and Ranolazine   Social History   Socioeconomic History   Marital status: Married    Spouse name: Not on file   Number of children: Not on file   Years of education: Not on file   Highest education level: Not on file  Occupational History   Not on file  Tobacco Use   Smoking status: Never   Smokeless tobacco: Never  Vaping Use   Vaping status: Never Used  Substance and Sexual Activity   Alcohol use: No   Drug use: No   Sexual activity: Not on file  Other Topics Concern   Not on file  Social History Narrative   Per [facility] New Patient Packet Abstracted on 03/06/2021      Diet: Left blank       Caffeine: No      Married, if yes what year: Yes, 1955      Do you live in a house, apartment, assisted living, condo, trailer, ect: House      Is it one or more stories: 1 story      How many persons live in your home? 2      Pets: No      Highest level or education completed: 12 th  grade      Current/Past profession: Research officer, political party       Exercise:          Yes       Type and how often: Walking          Living Will: Yes   DNR: No   POA/HPOA: Yes      Functional Status:   Do you have difficulty bathing or dressing yourself? Left Blank    Do you have difficulty preparing food or eating?Left Blank   Do you have difficulty managing your medications?Left Blank   Do you have difficulty managing your finances?Left Blank   Do you have difficulty affording your medications?Left Blank   Social Determinants of Health   Financial Resource Strain: Not on file  Food Insecurity: Food Insecurity Present (11/13/2022)   Hunger Vital Sign    Worried About Running Out of Food in the Last Year: Sometimes true    Ran Out of Food in the Last Year: Sometimes true  Transportation Needs: No Transportation Needs (11/13/2022)   PRAPARE - Administrator, Civil Service (Medical): No    Lack of Transportation (Non-Medical): No  Physical Activity: Not on file  Stress: Not on file  Social Connections: Not on file     Family History:  The patient's family history includes Brain cancer in her brother; Cancer in her mother and sister; Diabetes in her brother; Heart attack in her brother; Heart disease in her brother, brother, brother, father, sister, and sister; High blood pressure in her brother, brother,  and sister.   ROS:   Please see the history of present illness.    ROS All other systems are reviewed and are negative.   PHYSICAL EXAM:   VS:  BP (!) 132/52 (BP Location: Left Arm, Patient Position: Sitting, Cuff Size: Large)   Pulse 72   Ht 5\' 5"  (1.651 m)   Wt 177 lb 3.2 oz (80.4 kg)   SpO2 94%   BMI 29.49 kg/m      General: Alert, oriented x3, no distress, overweight, no longer obese.  In wheelchair. Head: no evidence of trauma, PERRL, EOMI, no exophtalmos or lid lag, no myxedema, no xanthelasma; normal ears, nose and oropharynx Neck: normal jugular  venous pulsations and no hepatojugular reflux; brisk carotid pulses without delay and no carotid bruits Chest: clear to auscultation, no signs of consolidation by percussion or palpation, normal fremitus, symmetrical and full respiratory excursions Cardiovascular: normal position and quality of the apical impulse, regular rhythm, normal first and second heart sounds, no murmurs, rubs or gallops Abdomen: no tenderness or distention, no masses by palpation, no abnormal pulsatility or arterial bruits, normal bowel sounds, no hepatosplenomegaly Extremities: no clubbing, cyanosis or edema; 2+ radial, ulnar and brachial pulses bilaterally; 2+ right femoral, posterior tibial and dorsalis pedis pulses; 2+ left femoral, posterior tibial and dorsalis pedis pulses; no subclavian or femoral bruits Neurological: grossly nonfocal Psych: Normal mood and affect    Wt Readings from Last 3 Encounters:  04/24/23 177 lb 3.2 oz (80.4 kg)  03/28/23 176 lb (79.8 kg)  03/04/23 180 lb 9.6 oz (81.9 kg)      Studies/Labs Reviewed:   EKG:  EKG is not ordered today. Personally reviewed the tracing from 11/13/2022 which shows normal sinus rhythm is a normal tracing.  Recent Labs: 11/13/2022: ALT 16 11/15/2022: Magnesium 2.0 02/04/2023: BUN 12; Creatinine, Ser 0.74; Potassium 3.8; Sodium 141 02/08/2023: Hemoglobin 14.2; Platelets 110 04/23/2023: TSH 0.61   Lipid Panel    Component Value Date/Time   CHOL 125 02/04/2023 1156   TRIG 90 02/04/2023 1156   HDL 64 02/04/2023 1156   CHOLHDL 2.0 02/04/2023 1156   CHOLHDL 3.1 09/04/2022 0938   VLDL 48 (H) 06/27/2015 1336   LDLCALC 44 02/04/2023 1156   LDLCALC 87 09/04/2022 0938      Labs 03/23/2020 Total cholesterol 145, HDL 55, LDL 69, triglycerides 118 TSH 0.886  09/21/2020 Total cholesterol 122, HDL 56, LDL 47, triglycerides 101  09/05/2021 Cholesterol 136, HDL 69, LDL 51, triglycerides 78  29/56/2130 Cholesterol 139, HDL 71, LDL 52, triglycerides  82  09/04/2022 Cholesterol 162, HDL 52, LDL 87, triglycerides 131    CATH 04/05/2017  LM lesion, 70 %stenosed. Mid LAD lesion, 0 %stenosed at site of prior stent. Ost Cx lesion, 60 %stenosed. Prox RCA-1 lesion, 20 %stenosed. Prox RCA-2 lesion, 30 %stenosed. The left ventricular systolic function is normal. LV end diastolic pressure is normal. The left ventricular ejection fraction is 55-65% by visual estimate.   1. 70% eccentric distal left main stenosis. Angiographically similar to 2016. FFR 0.89 into the LAD and 0.86 into the LCx suggesting that this lesion is not flow limiting.  2. Otherwise nonobstructive CAD. Patent stent in LAD. 3. Normal LV function 4. Normal LVEDP   Plan: recommend continued medical therapy.    ASSESSMENT:    1. Chronic diastolic heart failure (HCC)   2. Coronary artery disease of native artery of native heart with stable angina pectoris (HCC)   3. Essential hypertension   4. Mixed hyperlipidemia  5. Overweight (BMI 25.0-29.9)        PLAN:  In order of problems listed above:  CHF: Clinically euvolemic, NYHA functional class I-II (currently limited by leg pain).  Appears to be clinically euvolemic NYHA functional class II.  Need to adjust her "estimated dry weight" to 175-180 pounds (on our office scale; she typically weighs about 5 pounds less on her home scale.  Doing a good job avoiding sodium excess.  Since she is now taking unopposed loop diuretics need to make sure her potassium is okay.  Previous attempts at aggressive diuresis using metolazone has led to acute kidney injury on a couple of occasions.  We have had to avoid SGLT2 inhibitors due to cost.  Baseline BNP seems to be 100-124, has been as high as 230 in the past. CAD: She has not had angina in several years now.  She continues to take long-acting nitrates as her only antianginal (she is intolerant to beta-blockers due to bradycardia and ranolazine caused severe constipation,  amlodipine worsened edema).  She has a remote history of stent to the LAD more than 10 years ago with a "jailed" diagonal that probably is responsible for small area of anterolateral ischemia.   She has a known moderate stenosis in the left main coronary artery that was not significant by FFR performed as recently as 2018.  Nuclear stress testing would not be useful to evaluate this due to the likelihood of balanced ischemia.  If necessary, would follow this up with coronary CT angiography.  However I do not think she would be a candidate for bypass surgery anymore, with advancing age and worsening functional status.   HTN: She has gradually lost about 50 pounds in the last 4 years.  Systolic blood pressure is normal and her diastolic blood pressure is relatively low.  Will decrease her isosorbide mononitrate to just 30 mg once daily.  Her amlodipine and spironolactone were stopped after the fall in March.  Previously there seemed to be  evidence of left subclavian stenosis, but on the most read send assessment with duplex ultrasound the pressures were equal in the 2 upper extremities. Hyperlipidemia: Her lipid parameters are in desirable range on current medications (pravastatin plus ezetimibe).  Improvement can also be attributed to weight loss.  She did not tolerate atorvastatin.   Overweight: No longer obese, now in overweight range. Right leg pain: Preventing her from moving/walking.  She is going to see orthopedics later this week. CKD 2: She has had elevated creatinine in the past likely due to overdiuresis, most recent creatinine is normal at 0.74, corresponding to a GFR of 70 or higher. Neurological complaints: She had some complaints that might have represented a TIA earlier in 2023, but I wonder whether they were more related to anxiety and frustration.  No recent problems to suggest TIA or stroke.  Repeat carotid duplex ultrasound performed 11/03/2021 shows widely patent carotids and antegrade flow  in the vertebral arteries bilaterally, without significant plaque . Family/social stressors: Bob's dementia has had serious impact on his own physical health and on her emotional health.  She is doing the best she can.  She does have some home nursing visits that are helping; she does not want him to go to a nursing home.   Medication Adjustments/Labs and Tests Ordered: Current medicines are reviewed at length with the patient today.  Concerns regarding medicines are outlined above.  Medication changes, Labs and Tests ordered today are listed in the Patient Instructions below. Patient  Instructions  Medication Instructions:  Your physician has recommended you make the following change in your medication:  1) DECREASE Imdur (isosorbide) to once daily - if after one week you do not have any reoccurrence of chest pain you can discontinue.  2) DECREASE torsemide to 50 mg daily  *If you need a refill on your cardiac medications before your next appointment, please call your pharmacy*  Lab Work: IN THREE WEEKS: BMET and BNP  No appointment needed, lab here at the office is open Monday-Friday from 8AM to 4PM and closed daily for lunch from 12:45-1:45.    Follow-Up: At [facility], you and your health needs are our priority.  As part of our continuing mission to provide you with exceptional heart care, we have created designated Provider Care Teams.  These Care Teams include your primary Cardiologist (physician) and Advanced Practice Providers (APPs -  Physician Assistants and Nurse Practitioners) who all work together to provide you with the care you need, when you need it.   Your next appointment:   6 months  Provider:   Thurmon Fair, MD            Signed, Thurmon Fair, MD  04/27/2023 4:44 PM    [facility] [address] [place], [place], [place]  [address] Phone: [phone]; Fax: [phone]

## 2023-04-26 ENCOUNTER — Other Ambulatory Visit: Payer: Self-pay | Admitting: Surgical

## 2023-04-26 ENCOUNTER — Ambulatory Visit: Payer: PPO | Admitting: Surgical

## 2023-04-26 ENCOUNTER — Encounter: Payer: Self-pay | Admitting: Family Medicine

## 2023-04-26 ENCOUNTER — Ambulatory Visit (HOSPITAL_COMMUNITY): Admission: RE | Admit: 2023-04-26 | Payer: PPO | Source: Ambulatory Visit

## 2023-04-26 ENCOUNTER — Ambulatory Visit (INDEPENDENT_AMBULATORY_CARE_PROVIDER_SITE_OTHER): Payer: PPO

## 2023-04-26 DIAGNOSIS — S32591A Other specified fracture of right pubis, initial encounter for closed fracture: Secondary | ICD-10-CM | POA: Diagnosis not present

## 2023-04-26 DIAGNOSIS — M25551 Pain in right hip: Secondary | ICD-10-CM

## 2023-04-26 DIAGNOSIS — S32511A Fracture of superior rim of right pubis, initial encounter for closed fracture: Secondary | ICD-10-CM | POA: Diagnosis not present

## 2023-04-26 DIAGNOSIS — M7061 Trochanteric bursitis, right hip: Secondary | ICD-10-CM | POA: Diagnosis not present

## 2023-04-26 DIAGNOSIS — S72001A Fracture of unspecified part of neck of right femur, initial encounter for closed fracture: Secondary | ICD-10-CM | POA: Diagnosis not present

## 2023-04-27 ENCOUNTER — Encounter: Payer: Self-pay | Admitting: Surgical

## 2023-04-27 ENCOUNTER — Telehealth: Payer: Self-pay | Admitting: Surgical

## 2023-04-27 ENCOUNTER — Encounter: Payer: Self-pay | Admitting: Cardiovascular Disease

## 2023-04-27 NOTE — Telephone Encounter (Signed)
Called patient several times in order to discuss MRI results.  Cannot get a hold of her.  Left a voicemail message.  Then I called her son Jill Preston and discussed the results with him and asked that he passed the message on to [name].  Reiterated to limit weightbearing is much as possible just as we spoke about in the clinic.  She will need to come into the office on Monday in order to get some lab work and discussed the findings.  Need to check vitamin D.  We also need to set up DEXA scan and likely have her follow-up with [name] for evaluation of osteoporosis and multiple stress fracture/stress reactions.

## 2023-04-27 NOTE — Progress Notes (Signed)
Office Visit Note   Patient: Jill Preston           Date of Birth: 11/18/1932           MRN: [id number] Visit Date: 04/26/2023 Requested by: [name], NP [address] [place],  [place] [address] PCP: [name], NP  Subjective: Chief Complaint  Patient presents with   Left Preston - Pain    MRI left tib fib review    HPI: Jill Preston is a 89 y.o. female who presents to the office reporting left Preston and right hip pain.  Patient is here to review left tib-fib MRI demonstrating no stress fracture or stress reaction.  Her main complaint today is actually her right hip pain.  Localizes pain to the groin.  She states that this pain has been present for over a month.  It began when she did a lap of walking around her house and she noticed after she sat down she had severe groin pain and difficulty getting up from a seated position.  For the next 2 to 3 weeks she remained in a wheelchair.  Over the last week she has been able to progress to using a walker though she feels continued groin pain especially worse with weightbearing.  She denies any recent falls or injuries aside from a fall directly onto her buttocks back in February but she did not have onset of pain at that point.  Denies any increase in her typical back pain.  No radicular pain.  No numbness or tingling.  No history of prior hip surgery but she has had prior back surgery.  No chest pain or shortness of breath.  No fevers or chills.   Has seen her PCP with x-rays ordered that turned out to be negative.              ROS: All systems reviewed are negative as they relate to the chief complaint within the history of present illness.  Patient denies fevers or chills.  Assessment & Plan: Visit Diagnoses:  1. Pain in right hip     Plan: Patient is an 89 year old female who presents for evaluation of right hip pain primarily today.  We did review her left tib-fib MRI demonstrating no stress fracture or stress reaction.   Main concern is the right hip pain localizing to the groin that has present for about 4 to 6 weeks.  No fall or injury that she can recall.  Has had negative radiographs by her PCP and today based on her severe groin pain and difficulty ambulating and the continued nature of this pain, new radiographs were obtained today demonstrating no evidence of fracture or dislocation.  No significant arthritis to explain her symptoms.  Ordered stat MRI of the right hip to evaluate for occult hip fracture which showed several findings at the time of this dictation including primarily a nondisplaced dress fracture of the right superior pubic ramus with a lot of marrow edema in the acetabulum.  Also has multiple other nondisplaced stress fractures.  None of these appear to be operative but encourage patient to essentially become nonweightbearing.  Her social situation is complicated by living at home alone with her husband who has Alzheimer's and she has to care for him.  Reached out to patient multiple times to discuss her findings but could not get a hold of her.  Did eventually reach out to her son and discussed the findings with him and he  stated that he would let his mother know with the results and also that she needs to return to the office on Monday for blood work and to discuss next steps.  No recent DEXA scan that is available in the chart which she will probably need with all of these stress injuries.  Plan to check her vitamin D level with blood work.  Likely have her follow-up with [name] to evaluate and treat osteoporosis.    Follow-Up Instructions: No follow-ups on file.   Orders:  Orders Placed This Encounter  Procedures   XR HIP UNILAT W OR W/O PELVIS 2-3 VIEWS RIGHT   MR Hip Right w/o contrast   No orders of the defined types were placed in this encounter.     Procedures: No procedures performed   Clinical Data: No additional findings.  Objective: Vital Signs: There were  no vitals taken for this visit.  Physical Exam:  Constitutional: Patient appears well-developed HEENT:  Head: Normocephalic Eyes:EOM are normal Neck: Normal range of motion Cardiovascular: Normal rate Pulmonary/chest: Effort normal Neurologic: Patient is alert Skin: Skin is warm Psychiatric: Patient has normal mood and affect  Ortho Exam: Ortho exam demonstrates right hip with increased pain with FADIR sign and Stinchfield sign.  She has intact hip flexion, quadricep, hamstring, dorsiflexion, plantarflexion bilaterally without weakness.  No calf tenderness.  Negative Homans' sign.  No effusion in the right Preston.  Small effusion noted in the left Preston.  She is able to ambulate and place weight on her right leg though she ambulates with moderate antalgia.  Specialty Comments:  No specialty comments available.  Imaging: MR Hip Right w/o contrast  Result Date: 04/26/2023 CLINICAL DATA:  Right hip pain. EXAM: MR OF THE RIGHT HIP WITHOUT CONTRAST TECHNIQUE: Multiplanar, multisequence MR imaging was performed. No intravenous contrast was administered. COMPARISON:  None Available. FINDINGS: Bones: Nondisplaced fracture of the right superior pubic ramus-acetabular junction with severe surrounding bone marrow edema. Nondisplaced fracture of the right inferior pubic ramus. Severe bone marrow edema in the left superior and inferior pubic rami concerning for stress reaction with possible nondisplaced fracture of the left inferior pubic ramus. Marrow edema in the right greater trochanter with a subtle linear component concerning for an isolated right greater trochanteric fracture without displacement and without extension into the intertrochanteric region. No dislocation. No periosteal reaction or bone destruction. No aggressive osseous lesion. Focal marrow edema in the right sacral ala concerning for stress reaction. Mild osteoarthritis of bilateral SI joints. Posterior lumbar interbody fusion at L4-5.  Articular cartilage and labrum Articular cartilage: High-grade partial-thickness cartilage loss of the right femoral head and acetabulum. Labrum:  Right labral degeneration with a superior labral tear. Joint or bursal effusion Joint effusion: Small right hip joint effusion. No left hip joint effusion. No SI joint effusion. Bursae: Small amount of fluid in the greater trochanter bursa bilaterally. Muscles and tendons Flexors: Normal. Extensors: Normal. Abductors: Normal. Adductors: Mild edema in the right operator externus muscle consistent with muscle strain. Gluteals: Mild muscle strain in the right gluteus minimus and medius muscles. Hamstrings: Normal. Other findings No pelvic free fluid. No fluid collection or hematoma. No inguinal lymphadenopathy. No inguinal hernia. IMPRESSION: 1. Acute nondisplaced fracture of the right superior pubic ramus-acetabular junction with severe surrounding bone marrow edema. 2. Acute nondisplaced fracture of the right inferior pubic ramus. 3. Severe bone marrow edema in the left superior and inferior pubic rami concerning for stress reaction with possible nondisplaced fracture of the left inferior  pubic ramus. 4. Marrow edema in the right greater trochanter with a subtle linear component concerning for an isolated right greater trochanteric fracture without displacement and without extension into the intertrochanteric region. 5. Focal marrow edema in the right sacral ala concerning for stress reaction. 6. Moderate osteoarthritis of the right hip. Right labral degeneration with a superior labral tear. 7. Mild greater trochanteric bursitis bilaterally. Electronically Signed   By: Elige Ko M.D.   On: 04/26/2023 18:29     PMFS History: Patient Active Problem List   Diagnosis Date Noted   Class 2 severe obesity due to excess calories with serious comorbidity and body mass index (BMI) of 35.0 to 35.9 in adult [facility]) 03/04/2023   Concussion 11/13/2022   Head injury due to  trauma 11/12/2022   Fall 10/25/2022   Hypokalemia 03/08/2022   Sick sinus syndrome (HCC) 03/07/2022   Hordeolum externum (stye) 05/16/2021   Chronic otitis media with serous effusion 05/16/2021   Thrombocytopenia (HCC) 04/17/2021   PAD (peripheral artery disease) (HCC) 08/01/2018   Herniated intervertebral disc of lumbar spine 06/02/2018   Bilateral leg pain 01/27/2017   Cervical spine degeneration 10/23/2015   Hypersomnolence 10/23/2015   S/P IVC filter 02/15/2015   Angina pectoris (HCC) 01/26/2015   CAD in native artery    Ischemic chest pain (HCC)    Edema of left lower extremity 09/16/2014   Acute deep vein thrombosis of left lower extremity (HCC) 09/14/2014   Anemia associated with acute blood loss 09/14/2014   Subclavian artery stenosis, left (HCC) 08/04/2014   Chronic diastolic CHF (congestive heart failure) (HCC) 07/16/2014   Essential hypertension 07/08/2014   CKD (chronic kidney disease), stage III (HCC)    Hypertensive heart disease    Chronic venous insufficiency    Metatarsal deformity 06/24/2013   CAD - LAD stent 12/00. Patent '02 and 1/13, moderate D2 managed medically 02/26/2013   Dyslipidemia 02/26/2013   DJD (degenerative joint disease)- back 02/26/2013   Asthma 08/06/2012   Hyperlipidemia 08/05/2012   Lichen sclerosus 06/03/2012   Rectocele 06/03/2012   Recurrent UTI 06/03/2012   Urge urinary incontinence 06/03/2012   Voiding dysfunction 06/03/2012   Hypothyroidism 07/23/2011   Past Medical History:  Diagnosis Date   Arthritis    right Preston   Cancer (HCC) yrs ago   skin cancer removed from face   Chronic diastolic CHF (congestive heart failure) (HCC)    Chronic venous insufficiency    LEA VENOUS, 10/17/2011 - mild reflux in bilateral common femoral veins   CKD (chronic kidney disease), stage III (HCC)    Coronary artery disease    a. s/p PCI/BMS to prox LAD and balloon angioplasty to mLAD with suboptimal result in 2000. b. Abnl nuc 2012, cath 09/2011  - showed that the overall territory of potential ischemia was small and attributable to a moderately diseased small second diagonal artery. Med rx.   Dyspnea    with activity   Headache    History of blood transfusion 2017   Hyperlipidemia    Hypertension    Hypertensive heart disease    Hypothyroidism    Obesity    Pneumonia    several times   Stroke [facility])    pt. states she had "light stroke" in sept. 1980   Urinary incontinence     Family History  Problem Relation Age of Onset   Cancer Mother    Heart disease Father    Heart disease Sister    High blood pressure Sister  Heart disease Sister    Cancer Sister    Heart disease Brother    Diabetes Brother    Brain cancer Brother    High blood pressure Brother    Heart disease Brother    Heart attack Brother    Heart disease Brother    High blood pressure Brother     Past Surgical History:  Procedure Laterality Date   ABDOMINAL HYSTERECTOMY  1970's   complete   ANKLE ARTHROSCOPY Right 07/10/2017   Procedure: ANKLE ARTHROSCOPY;  Surgeon: Park Liter, DPM;  Location: [facility] OR;  Service: Podiatry;  Laterality: Right;   BACK SURGERY  07/26/2016   cervical neck surgery, [facility]   BALLOON DILATION N/A 12/01/2021   Procedure: BALLOON DILATION;  Surgeon: Jeani Hawking, MD;  Location: [facility] ENDOSCOPY;  Service: Gastroenterology;  Laterality: N/A;   BLADDER SURGERY  2010   WITH MESH  bladder tach   bunion removal surgery Bilateral 15 yrs ago   CARDIAC CATHETERIZATION Left 09/25/2011   Medical management   CARDIAC CATHETERIZATION Left 03/25/2001   Normal LV function, LAD residual narrowing of less than 10%, normal ramus intermediate, circumflex, and RCA,    CARDIAC CATHETERIZATION  09/04/1999   LAD, 3x99mm Tetra stent resulting in a reduction of the 80% stenosis to 0% residual   CARDIAC CATHETERIZATION N/A 01/26/2015   Procedure: Right/Left Heart Cath and Coronary Angiography;  Surgeon: Lennette Bihari, MD;   Location: [facility] INVASIVE CV LAB;  Service: Cardiovascular;  Laterality: N/A;   CARDIAC CATHETERIZATION N/A 01/27/2015   Procedure: Intravascular Pressure Wire/FFR Study;  Surgeon: Kathleene Hazel, MD;  Location: [facility] INVASIVE CV LAB;  Service: Cardiovascular;  Laterality: N/A;   CARDIAC CATHETERIZATION N/A 01/27/2015   Procedure: Right Heart Cath;  Surgeon: Kathleene Hazel, MD;  Location: [facility] INVASIVE CV LAB;  Service: Cardiovascular;  Laterality: N/A;   CHOLECYSTECTOMY     COLONOSCOPY WITH PROPOFOL N/A 03/07/2017   Procedure: COLONOSCOPY WITH PROPOFOL;  Surgeon: Charna Elizabeth, MD;  Location: [facility];  Service: Endoscopy;  Laterality: N/A;   CORONARY PRESSURE/FFR STUDY N/A 04/05/2017   Procedure: Intravascular Pressure Wire/FFR Study;  Surgeon: Swaziland, Peter M, MD;  Location: [facility] INVASIVE CV LAB;  Service: Cardiovascular;  Laterality: N/A;   CORONARY STENT PLACEMENT     LAD x 1   ESOPHAGOGASTRODUODENOSCOPY (EGD) WITH PROPOFOL N/A 12/01/2021   Procedure: ESOPHAGOGASTRODUODENOSCOPY (EGD) WITH PROPOFOL;  Surgeon: Jeani Hawking, MD;  Location: [facility];  Service: Gastroenterology;  Laterality: N/A;   ganglion cyst removal  yrs ago   x 2   ILIAC VEIN ANGIOPLASTY / STENTING  02/15/2015   IVC FILTER INSERTION  2016   IVC FILTER REMOVAL  02/15/2015   at [facility]   LEFT HEART CATH AND CORONARY ANGIOGRAPHY N/A 04/05/2017   Procedure: Left Heart Cath and Coronary Angiography;  Surgeon: Swaziland, Peter M, MD;  Location: [facility] INVASIVE CV LAB;  Service: Cardiovascular;  Laterality: N/A;   LEFT HEART CATHETERIZATION WITH CORONARY ANGIOGRAM N/A 09/25/2011   Procedure: LEFT HEART CATHETERIZATION WITH CORONARY ANGIOGRAM;  Surgeon: Thurmon Fair, MD;  Location: [facility] CATH LAB;  Service: Cardiovascular;  Laterality: N/A;   multiple bladder surgeries to remove mesh     ROTATOR CUFF REPAIR Right    stent to groin Left 08/2014   left leg   TENDON REPAIR Right 07/10/2017   Procedure: RIGHT PERONEAL TENDON REPAIR;   Surgeon: Park Liter, DPM;  Location: [facility] OR;  Service: Podiatry;  Laterality: Right;   TENDON REPAIR Right  05/27/2019   Procedure: PERONEAL TENDON REPAIR x2;  Surgeon: Park Liter, DPM;  Location: [facility] ORS;  Service: Podiatry;  Laterality: Right;   Social History   Occupational History   Not on file  Tobacco Use   Smoking status: Never   Smokeless tobacco: Never  Vaping Use   Vaping status: Never Used  Substance and Sexual Activity   Alcohol use: No   Drug use: No   Sexual activity: Not on file

## 2023-04-29 ENCOUNTER — Encounter: Payer: Self-pay | Admitting: Family Medicine

## 2023-04-29 ENCOUNTER — Ambulatory Visit: Payer: PPO | Admitting: Surgical

## 2023-04-29 ENCOUNTER — Telehealth: Payer: Self-pay | Admitting: Orthopedic Surgery

## 2023-04-29 DIAGNOSIS — M25551 Pain in right hip: Secondary | ICD-10-CM

## 2023-04-29 NOTE — Telephone Encounter (Signed)
scheduled

## 2023-04-29 NOTE — Telephone Encounter (Signed)
Patient called advised Dr. August Saucer asked her to call for results of her MRI and to schedule an appointment today. The number to contact patient is [phone]

## 2023-04-29 NOTE — Telephone Encounter (Signed)
Yeah she needs to have appt today.  Think it would be best for someone to drive her to the office and she needs a wheelchair when she gets here because she needs to be nonweightbearing as much as possible

## 2023-04-30 LAB — VITAMIN D 25 HYDROXY (VIT D DEFICIENCY, FRACTURES): Vit D, 25-Hydroxy: 52 ng/mL (ref 30–100)

## 2023-05-03 ENCOUNTER — Encounter: Payer: Self-pay | Admitting: Orthopedic Surgery

## 2023-05-03 NOTE — Progress Notes (Signed)
Office Visit Note   Patient: Jill Preston           Date of Birth: [birth year]           MRN: [id number] Visit Date: 04/29/2023 Requested by: [name], NP [address] [place],  [place] [address] PCP: [name], NP  Subjective: Chief Complaint  Patient presents with   Other     Review MRI scan    HPI: Jill Preston is a 89 y.o. female who presents to the office for MRI review. Patient denies any changes in symptoms.  Continues to complain mainly of right hip pain that is worse with ambulation.  Denies any chest pain or shortness of breath.  No calf pain today.  She is here today in a wheelchair.  MRI results revealed: MR Hip Right w/o contrast  Result Date: 04/26/2023 CLINICAL DATA:  Right hip pain. EXAM: MR OF THE RIGHT HIP WITHOUT CONTRAST TECHNIQUE: Multiplanar, multisequence MR imaging was performed. No intravenous contrast was administered. COMPARISON:  None Available. FINDINGS: Bones: Nondisplaced fracture of the right superior pubic ramus-acetabular junction with severe surrounding bone marrow edema. Nondisplaced fracture of the right inferior pubic ramus. Severe bone marrow edema in the left superior and inferior pubic rami concerning for stress reaction with possible nondisplaced fracture of the left inferior pubic ramus. Marrow edema in the right greater trochanter with a subtle linear component concerning for an isolated right greater trochanteric fracture without displacement and without extension into the intertrochanteric region. No dislocation. No periosteal reaction or bone destruction. No aggressive osseous lesion. Focal marrow edema in the right sacral ala concerning for stress reaction. Mild osteoarthritis of bilateral SI joints. Posterior lumbar interbody fusion at L4-5. Articular cartilage and labrum Articular cartilage: High-grade partial-thickness cartilage loss of the right femoral head and acetabulum. Labrum:  Right labral degeneration with a  superior labral tear. Joint or bursal effusion Joint effusion: Small right hip joint effusion. No left hip joint effusion. No SI joint effusion. Bursae: Small amount of fluid in the greater trochanter bursa bilaterally. Muscles and tendons Flexors: Normal. Extensors: Normal. Abductors: Normal. Adductors: Mild edema in the right operator externus muscle consistent with muscle strain. Gluteals: Mild muscle strain in the right gluteus minimus and medius muscles. Hamstrings: Normal. Other findings No pelvic free fluid. No fluid collection or hematoma. No inguinal lymphadenopathy. No inguinal hernia. IMPRESSION: 1. Acute nondisplaced fracture of the right superior pubic ramus-acetabular junction with severe surrounding bone marrow edema. 2. Acute nondisplaced fracture of the right inferior pubic ramus. 3. Severe bone marrow edema in the left superior and inferior pubic rami concerning for stress reaction with possible nondisplaced fracture of the left inferior pubic ramus. 4. Marrow edema in the right greater trochanter with a subtle linear component concerning for an isolated right greater trochanteric fracture without displacement and without extension into the intertrochanteric region. 5. Focal marrow edema in the right sacral ala concerning for stress reaction. 6. Moderate osteoarthritis of the right hip. Right labral degeneration with a superior labral tear. 7. Mild greater trochanteric bursitis bilaterally. Electronically Signed   By: Elige Ko M.D.   On: 04/26/2023 18:29   MR TIBIA FIBULA LEFT WO CONTRAST  Result Date: 04/14/2023 CLINICAL DATA:  Persistent left knee and lower leg pain since falling 4 months ago. No previous relevant surgery. Evaluate for stress reaction in tibia. EXAM: MRI OF LOWER LEFT EXTREMITY WITHOUT CONTRAST TECHNIQUE: Multiplanar, multisequence MR imaging of the left lower leg was performed. No  intravenous contrast was administered. COMPARISON:  Left knee radiographs 11/28/2022  FINDINGS: Bones/Joint/Cartilage Both lower legs are included on the coronal images. Images extend from the left knee through the distal lower leg. The ankles are incompletely visualized. There is no evidence of acute fracture, dislocation or periosteal reaction. Mildly heterogeneous T2 signal in both proximal tibias attributed to hematopoietic marrow. No suspicious T1 signal. Mild degenerative changes at both knees without significant joint effusions. Ligaments Incomplete visualization of the knees. The collateral ligaments appear intact. Muscles and Tendons Unremarkable. Soft tissues Nonspecific subcutaneous edema within both lower legs appears symmetric. No focal fluid collection or foreign body identified. IMPRESSION: 1. No evidence of acute fracture or stress reaction in either lower leg. 2. Nonspecific subcutaneous edema within both lower legs, symmetric. 3. Mild degenerative changes at both knees. Electronically Signed   By: Carey Bullocks M.D.   On: 04/14/2023 13:50                 ROS: All systems reviewed are negative as they relate to the chief complaint within the history of present illness.  Patient denies fevers or chills.  Assessment & Plan: Visit Diagnoses:  1. Pain in right hip     Plan: Jill Preston is a 89 y.o. female who presents to the office for review of right hip MRI.  Reviewed MRI demonstrating multiple areas of nondisplaced stress fractures and stress reactions responsible for her atraumatic hip pain over the last 4 to 6 weeks.  She has been compliant with nonweightbearing since we spoke on the phone over the weekend.  We will plan to arrange a DEXA scan and check her vitamin D which was found to be in normal limits.  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  She will let us know if she has any symptoms of chest pain/shortness of breath/calf pain that seem new for her.  We will also have her follow-up with [place]  [facility] PA-C for evaluation and treatment of her osteoporosis which is likely the underlying culprit to all of these stress injuries to her pelvis.  Spoke with her cardiologist Dr. [name] who stated that aspirin would be okay for her.  Follow-up with Mary-anne after DEXA scan.  Patient understands that she needs to be nonweightbearing is much as possible.  Currently none of the fractures appear operative at this time.  Follow-Up Instructions: No follow-ups on file.   Orders:  Orders Placed This Encounter  Procedures   DG BONE DENSITY (DXA)   Vitamin D (25 hydroxy)   Ambulatory referral to Orthopedic Surgery   No orders of the defined types were placed in this encounter.     Procedures: No procedures performed   Clinical Data: No additional findings.  Objective: Vital Signs: There were no vitals taken for this visit.  Physical Exam:  Constitutional: Patient appears well-developed HEENT:  Head: Normocephalic Eyes:EOM are normal Neck: Normal range of motion Cardiovascular: Normal rate Pulmonary/chest: Effort normal Neurologic: Patient is alert Skin: Skin is warm Psychiatric: Patient has normal mood and affect  Ortho Exam: Ortho exam demonstrates no change compared with prior exam.  No calf tenderness.  Negative Homans' sign.  Specialty Comments:  No specialty comments available.  Imaging: No results found.   PMFS History: Patient Active Problem List   Diagnosis Date Noted   Class 2 severe obesity due to excess calories with serious comorbidity and body mass index (BMI) of 35.0  to 35.9 in adult [facility] 03/04/2023   Concussion 11/13/2022   Head injury due to trauma 11/12/2022   Fall 10/25/2022   Hypokalemia 03/08/2022   Sick sinus syndrome (HCC) 03/07/2022   Hordeolum externum (stye) 05/16/2021   Chronic otitis media with serous effusion 05/16/2021   Thrombocytopenia (HCC) 04/17/2021   PAD (peripheral artery disease) (HCC) 08/01/2018   Herniated intervertebral  disc of lumbar spine 06/02/2018   Bilateral leg pain 01/27/2017   Cervical spine degeneration 10/23/2015   Hypersomnolence 10/23/2015   S/P IVC filter 02/15/2015   Angina pectoris (HCC) 01/26/2015   CAD in native artery    Ischemic chest pain (HCC)    Edema of left lower extremity 09/16/2014   Acute deep vein thrombosis of left lower extremity (HCC) 09/14/2014   Anemia associated with acute blood loss 09/14/2014   Subclavian artery stenosis, left (HCC) 08/04/2014   Chronic diastolic CHF (congestive heart failure) (HCC) 07/16/2014   Essential hypertension 07/08/2014   CKD (chronic kidney disease), stage III (HCC)    Hypertensive heart disease    Chronic venous insufficiency    Metatarsal deformity 06/24/2013   CAD - LAD stent 12/00. Patent '02 and 1/13, moderate D2 managed medically 02/26/2013   Dyslipidemia 02/26/2013   DJD (degenerative joint disease)- back 02/26/2013   Asthma 08/06/2012   Hyperlipidemia 08/05/2012   Lichen sclerosus 06/03/2012   Rectocele 06/03/2012   Recurrent UTI 06/03/2012   Urge urinary incontinence 06/03/2012   Voiding dysfunction 06/03/2012   Hypothyroidism 07/23/2011   Past Medical History:  Diagnosis Date   Arthritis    right knee   Cancer (HCC) yrs ago   skin cancer removed from face   Chronic diastolic CHF (congestive heart failure) (HCC)    Chronic venous insufficiency    LEA VENOUS, 10/17/2011 - mild reflux in bilateral common femoral veins   CKD (chronic kidney disease), stage III (HCC)    Coronary artery disease    a. s/p PCI/BMS to prox LAD and balloon angioplasty to mLAD with suboptimal result in 2000. b. Abnl nuc 2012, cath 09/2011 - showed that the overall territory of potential ischemia was small and attributable to a moderately diseased small second diagonal artery. Med rx.   Dyspnea    with activity   Headache    History of blood transfusion 2017   Hyperlipidemia    Hypertension    Hypertensive heart disease    Hypothyroidism     Obesity    Pneumonia    several times   Stroke [facility])    pt. states she had "light stroke" in sept. 1980   Urinary incontinence     Family History  Problem Relation Age of Onset   Cancer Mother    Heart disease Father    Heart disease Sister    High blood pressure Sister    Heart disease Sister    Cancer Sister    Heart disease Brother    Diabetes Brother    Brain cancer Brother    High blood pressure Brother    Heart disease Brother    Heart attack Brother    Heart disease Brother    High blood pressure Brother     Past Surgical History:  Procedure Laterality Date   ABDOMINAL HYSTERECTOMY  1970's   complete   ANKLE ARTHROSCOPY Right 07/10/2017   Procedure: ANKLE ARTHROSCOPY;  Surgeon: Park Liter, DPM;  Location: [facility] OR;  Service: Podiatry;  Laterality: Right;   BACK SURGERY  07/26/2016   cervical neck  surgery, [facility]   BALLOON DILATION N/A 12/01/2021   Procedure: BALLOON DILATION;  Surgeon: Jeani Hawking, MD;  Location: [facility];  Service: Gastroenterology;  Laterality: N/A;   BLADDER SURGERY  2010   WITH MESH  bladder tach   bunion removal surgery Bilateral 15 yrs ago   CARDIAC CATHETERIZATION Left 09/25/2011   Medical management   CARDIAC CATHETERIZATION Left 03/25/2001   Normal LV function, LAD residual narrowing of less than 10%, normal ramus intermediate, circumflex, and RCA,    CARDIAC CATHETERIZATION  09/04/1999   LAD, 3x64mm Tetra stent resulting in a reduction of the 80% stenosis to 0% residual   CARDIAC CATHETERIZATION N/A 01/26/2015   Procedure: Right/Left Heart Cath and Coronary Angiography;  Surgeon: Lennette Bihari, MD;  Location: [facility] INVASIVE CV LAB;  Service: Cardiovascular;  Laterality: N/A;   CARDIAC CATHETERIZATION N/A 01/27/2015   Procedure: Intravascular Pressure Wire/FFR Study;  Surgeon: Kathleene Hazel, MD;  Location: [facility] INVASIVE CV LAB;  Service: Cardiovascular;  Laterality: N/A;   CARDIAC CATHETERIZATION N/A 01/27/2015    Procedure: Right Heart Cath;  Surgeon: Kathleene Hazel, MD;  Location: [facility] INVASIVE CV LAB;  Service: Cardiovascular;  Laterality: N/A;   CHOLECYSTECTOMY     COLONOSCOPY WITH PROPOFOL N/A 03/07/2017   Procedure: COLONOSCOPY WITH PROPOFOL;  Surgeon: Charna Elizabeth, MD;  Location: [facility] ENDOSCOPY;  Service: Endoscopy;  Laterality: N/A;   CORONARY PRESSURE/FFR STUDY N/A 04/05/2017   Procedure: Intravascular Pressure Wire/FFR Study;  Surgeon: Swaziland, Peter M, MD;  Location: [facility] INVASIVE CV LAB;  Service: Cardiovascular;  Laterality: N/A;   CORONARY STENT PLACEMENT     LAD x 1   ESOPHAGOGASTRODUODENOSCOPY (EGD) WITH PROPOFOL N/A 12/01/2021   Procedure: ESOPHAGOGASTRODUODENOSCOPY (EGD) WITH PROPOFOL;  Surgeon: Jeani Hawking, MD;  Location: [facility];  Service: Gastroenterology;  Laterality: N/A;   ganglion cyst removal  yrs ago   x 2   ILIAC VEIN ANGIOPLASTY / STENTING  02/15/2015   IVC FILTER INSERTION  2016   IVC FILTER REMOVAL  02/15/2015   at [facility]   LEFT HEART CATH AND CORONARY ANGIOGRAPHY N/A 04/05/2017   Procedure: Left Heart Cath and Coronary Angiography;  Surgeon: Swaziland, Peter M, MD;  Location: [facility] INVASIVE CV LAB;  Service: Cardiovascular;  Laterality: N/A;   LEFT HEART CATHETERIZATION WITH CORONARY ANGIOGRAM N/A 09/25/2011   Procedure: LEFT HEART CATHETERIZATION WITH CORONARY ANGIOGRAM;  Surgeon: Thurmon Fair, MD;  [facility] CATH [facility];  Service: Cardiovascular;  Laterality: N/A;   multiple bladder surgeries to remove mesh     ROTATOR CUFF REPAIR Right    stent to groin Left 08/2014   left leg   TENDON REPAIR Right 07/10/2017   Procedure: RIGHT PERONEAL TENDON REPAIR;  Surgeon: Park Liter, DPM;  Location: [facility];  Service: Podiatry;  Laterality: Right;   TENDON REPAIR Right 05/27/2019   Procedure: PERONEAL TENDON REPAIR x2;  Surgeon: Park Liter, DPM;  Location: [facility] ORS;  Service: Podiatry;  Laterality: Right;   Social History   Occupational History   Not on file   Tobacco Use   Smoking status: Never   Smokeless tobacco: Never  Vaping Use   Vaping status: Never Used  Substance and Sexual Activity   Alcohol use: No   Drug use: No   Sexual activity: Not on file

## 2023-05-08 DIAGNOSIS — G5603 Carpal tunnel syndrome, bilateral upper limbs: Secondary | ICD-10-CM | POA: Diagnosis not present

## 2023-05-10 DIAGNOSIS — M859 Disorder of bone density and structure, unspecified: Secondary | ICD-10-CM | POA: Diagnosis not present

## 2023-05-14 ENCOUNTER — Encounter: Payer: Self-pay | Admitting: Physician Assistant

## 2023-05-14 ENCOUNTER — Ambulatory Visit: Payer: PPO | Admitting: Physician Assistant

## 2023-05-14 DIAGNOSIS — I5032 Chronic diastolic (congestive) heart failure: Secondary | ICD-10-CM | POA: Diagnosis not present

## 2023-05-14 DIAGNOSIS — Z780 Asymptomatic menopausal state: Secondary | ICD-10-CM | POA: Diagnosis not present

## 2023-05-14 DIAGNOSIS — I10 Essential (primary) hypertension: Secondary | ICD-10-CM | POA: Diagnosis not present

## 2023-05-14 DIAGNOSIS — M8580 Other specified disorders of bone density and structure, unspecified site: Secondary | ICD-10-CM | POA: Diagnosis not present

## 2023-05-14 DIAGNOSIS — I25118 Atherosclerotic heart disease of native coronary artery with other forms of angina pectoris: Secondary | ICD-10-CM | POA: Diagnosis not present

## 2023-05-14 NOTE — Progress Notes (Signed)
Office Visit Note   Patient: Jill Preston           Date of Birth: Apr 19, 1933           MRN: [id number] Visit Date: 05/14/2023              Requested by: [name], NP [address] [place],  [place] [address] PCP: [name], NP  No chief complaint on file.     HPI: This is a pleasant 89 year old woman who was referred to myself for evaluation for possible osteoporosis treatment.  She has a history of multiple fractures.  She was followed by lip.  He recommended referral for osteoporosis treatment.  She is the caregiver for her husband.  She states she does not walk much but uses wheelchair intermittently.  She did see endocrinology a few days ago and they are planning on doing a DEXA and have recommended most likely she will be a good candidate for Reclast  Assessment & Plan: Visit Diagnoses: Osteoporosis  Plan: I had a discussion with the patient today.  She has already engaged in treatment with endocrinology.  We did talk about lifestyle things she could do such as some weight training through [employer].  Also could consider home safety check making sure she eliminates any potential risks of fall at her home.  Talked about appropriate diet and vitamin D and calcium intake.  She will follow-up with me if needed otherwise she will continue to be followed by endocrinology you have already engaged with her and have a plan.  Follow-Up Instructions: No follow-ups on file.   Ortho Exam  Patient is alert, oriented, no adenopathy, well-dressed, normal affect, normal respiratory effort. Ambulates with the assistance of a walker  Imaging: No results found. No images are attached to the encounter.  Labs: Lab Results  Component Value Date   HGBA1C 6.1 (H) 01/26/2015   REPTSTATUS 07/22/2011 FINAL 07/21/2011   CULT  07/21/2011    Multiple bacterial morphotypes present, none predominant. Suggest appropriate recollection if clinically indicated.     Lab Results   Component Value Date   ALBUMIN 2.8 (L) 11/13/2022   ALBUMIN 2.9 (L) 11/12/2022   ALBUMIN 2.9 (L) 03/09/2022    Lab Results  Component Value Date   MG 2.0 11/15/2022   MG 2.1 03/08/2022   MG 1.9 04/11/2020   Lab Results  Component Value Date   VD25OH 52 04/29/2023    No results found for: "PREALBUMIN"    Latest Ref Rng & Units 02/08/2023    2:49 PM 11/15/2022    4:53 AM 11/13/2022    6:24 AM  CBC EXTENDED  WBC 3.8 - 10.8 Thousand/uL 5.3  5.7  5.9   RBC 3.80 - 5.10 Million/uL 4.35  3.76  3.93   Hemoglobin 11.7 - 15.5 g/dL 46.9  62.9  52.8   HCT 35.0 - 45.0 % 42.3  37.8  39.5   Platelets 140 - 400 Thousand/uL 110  78  73   NEUT# 1,500 - 7,800 cells/uL 2,995  3.6    Lymph# 850 - 3,900 cells/uL 1,479  1.1       There is no height or weight on file to calculate BMI.  Orders:  No orders of the defined types were placed in this encounter.  No orders of the defined types were placed in this encounter.    Procedures: No procedures performed  Clinical Data: No additional findings.  ROS:  All other systems negative,  except as noted in the HPI. Review of Systems  Objective: Vital Signs: There were no vitals taken for this visit.  Specialty Comments:  No specialty comments available.  PMFS History: Patient Active Problem List   Diagnosis Date Noted   Class 2 severe obesity due to excess calories with serious comorbidity and body mass index (BMI) of 35.0 to 35.9 in adult [facility]) 03/04/2023   Concussion 11/13/2022   Head injury due to trauma 11/12/2022   Fall 10/25/2022   Hypokalemia 03/08/2022   Sick sinus syndrome (HCC) 03/07/2022   Hordeolum externum (stye) 05/16/2021   Chronic otitis media with serous effusion 05/16/2021   Thrombocytopenia (HCC) 04/17/2021   PAD (peripheral artery disease) (HCC) 08/01/2018   Herniated intervertebral disc of lumbar spine 06/02/2018   Bilateral leg pain 01/27/2017   Cervical spine degeneration 10/23/2015   Hypersomnolence  10/23/2015   S/P IVC filter 02/15/2015   Angina pectoris (HCC) 01/26/2015   CAD in native artery    Ischemic chest pain (HCC)    Edema of left lower extremity 09/16/2014   Acute deep vein thrombosis of left lower extremity (HCC) 09/14/2014   Anemia associated with acute blood loss 09/14/2014   Subclavian artery stenosis, left (HCC) 08/04/2014   Chronic diastolic CHF (congestive heart failure) (HCC) 07/16/2014   Essential hypertension 07/08/2014   CKD (chronic kidney disease), stage III (HCC)    Hypertensive heart disease    Chronic venous insufficiency    Metatarsal deformity 06/24/2013   CAD - LAD stent 12/00. Patent '02 and 1/13, moderate D2 managed medically 02/26/2013   Dyslipidemia 02/26/2013   DJD (degenerative joint disease)- back 02/26/2013   Asthma 08/06/2012   Hyperlipidemia 08/05/2012   Lichen sclerosus 06/03/2012   Rectocele 06/03/2012   Recurrent UTI 06/03/2012   Urge urinary incontinence 06/03/2012   Voiding dysfunction 06/03/2012   Hypothyroidism 07/23/2011   Past Medical History:  Diagnosis Date   Arthritis    right knee   Cancer (HCC) yrs ago   skin cancer removed from face   Chronic diastolic CHF (congestive heart failure) (HCC)    Chronic venous insufficiency    LEA VENOUS, 10/17/2011 - mild reflux in bilateral common femoral veins   CKD (chronic kidney disease), stage III (HCC)    Coronary artery disease    a. s/p PCI/BMS to prox LAD and balloon angioplasty to mLAD with suboptimal result in 2000. b. Abnl nuc 2012, cath 09/2011 - showed that the overall territory of potential ischemia was small and attributable to a moderately diseased small second diagonal artery. Med rx.   Dyspnea    with activity   Headache    History of blood transfusion 2017   Hyperlipidemia    Hypertension    Hypertensive heart disease    Hypothyroidism    Obesity    Pneumonia    several times   Stroke [facility])    pt. states she had "light stroke" in sept. 1980   Urinary  incontinence     Family History  Problem Relation Age of Onset   Cancer Mother    Heart disease Father    Heart disease Sister    High blood pressure Sister    Heart disease Sister    Cancer Sister    Heart disease Brother    Diabetes Brother    Brain cancer Brother    High blood pressure Brother    Heart disease Brother    Heart attack Brother    Heart disease Brother  High blood pressure Brother     Past Surgical History:  Procedure Laterality Date   ABDOMINAL HYSTERECTOMY  1970's   complete   ANKLE ARTHROSCOPY Right 07/10/2017   Procedure: ANKLE ARTHROSCOPY;  Surgeon: Park Liter, DPM;  Location: [facility];  Service: Podiatry;  Laterality: Right;   BACK SURGERY  07/26/2016   cervical neck surgery, [facility]   BALLOON DILATION N/A 12/01/2021   Procedure: BALLOON DILATION;  Surgeon: Jeani Hawking, MD;  [facility];  Service: Gastroenterology;  Laterality: N/A;   BLADDER SURGERY  2010   WITH MESH  bladder tach   bunion removal surgery Bilateral 15 yrs ago   CARDIAC CATHETERIZATION Left 09/25/2011   Medical management   CARDIAC CATHETERIZATION Left 03/25/2001   Normal LV function, LAD residual narrowing of less than 10%, normal ramus intermediate, circumflex, and RCA,    CARDIAC CATHETERIZATION  09/04/1999   LAD, 3x21mm Tetra stent resulting in a reduction of the 80% stenosis to 0% residual   CARDIAC CATHETERIZATION N/A 01/26/2015   Procedure: Right/Left Heart Cath and Coronary Angiography;  Surgeon: Lennette Bihari, MD;  [facility] INVASIVE CV LAB;  Service: Cardiovascular;  Laterality: N/A;   CARDIAC CATHETERIZATION N/A 01/27/2015   Procedure: Intravascular Pressure Wire/FFR Study;  Surgeon: Kathleene Hazel, MD;  Location: [facility] INVASIVE CV LAB;  Service: Cardiovascular;  Laterality: N/A;   CARDIAC CATHETERIZATION N/A 01/27/2015   Procedure: Right Heart Cath;  Surgeon: Kathleene Hazel, MD;  Location: [facility] INVASIVE CV LAB;  Service:  Cardiovascular;  Laterality: N/A;   CHOLECYSTECTOMY     COLONOSCOPY WITH PROPOFOL N/A 03/07/2017   Procedure: COLONOSCOPY WITH PROPOFOL;  Surgeon: Charna Elizabeth, MD;  Location[facility];  Service: Endoscopy;  Laterality: N/A;   CORONARY PRESSURE/FFR STUDY N/A 04/05/2017   Procedure: Intravascular Pressure Wire/FFR Study;  Surgeon: Swaziland, Peter M, MD;  Location: [facility] INVASIVE CV LAB;  Service: Cardiovascular;  Laterality: N/A;   CORONARY STENT PLACEMENT     LAD x 1   ESOPHAGOGASTRODUODENOSCOPY (EGD) WITH PROPOFOL N/A 12/01/2021   Procedure: ESOPHAGOGASTRODUODENOSCOPY (EGD) WITH PROPOFOL;  Surgeon: Jeani Hawking, MD;  Location[facility];  Service: Gastroenterology;  Laterality: N/A;   ganglion cyst removal  yrs ago   x 2   ILIAC VEIN ANGIOPLASTY / STENTING  02/15/2015   IVC FILTER INSERTION  2016   IVC FILTER REMOVAL  02/15/2015   at [facility]   LEFT HEART CATH AND CORONARY ANGIOGRAPHY N/A 04/05/2017   Procedure: Left Heart Cath and Coronary Angiography;  Surgeon: Swaziland, Peter M, MD;  Location: [facility] INVASIVE CV LAB;  Service: Cardiovascular;  Laterality: N/A;   LEFT HEART CATHETERIZATION WITH CORONARY ANGIOGRAM N/A 09/25/2011   Procedure: LEFT HEART CATHETERIZATION WITH CORONARY ANGIOGRAM;  Surgeon: Thurmon Fair, MD;  Location[facility] CATH LAB;  Service: Cardiovascular;  Laterality: N/A;   multiple bladder surgeries to remove mesh     ROTATOR CUFF REPAIR Right    stent to groin Left 08/2014   left leg   TENDON REPAIR Right 07/10/2017   Procedure: RIGHT PERONEAL TENDON REPAIR;  Surgeon: Park Liter, DPM;  Location: [facility] OR;  Service: Podiatry;  Laterality: Right;   TENDON REPAIR Right 05/27/2019   Procedure: PERONEAL TENDON REPAIR x2;  Surgeon: Park Liter, DPM;  Location: [facility] ORS;  Service: Podiatry;  Laterality: Right;   Social History   Occupational History   Not on file  Tobacco Use   Smoking status: Never   Smokeless tobacco: Never  Vaping Use  Vaping status: Never Used   Substance and Sexual Activity   Alcohol use: No   Drug use: No   Sexual activity: Not on file

## 2023-05-15 DIAGNOSIS — Z961 Presence of intraocular lens: Secondary | ICD-10-CM | POA: Diagnosis not present

## 2023-05-15 DIAGNOSIS — H401131 Primary open-angle glaucoma, bilateral, mild stage: Secondary | ICD-10-CM | POA: Diagnosis not present

## 2023-05-15 LAB — BASIC METABOLIC PANEL
BUN/Creatinine Ratio: 24 (ref 12–28)
BUN: 19 mg/dL (ref 8–27)
CO2: 30 mmol/L — ABNORMAL HIGH (ref 20–29)
Calcium: 9.5 mg/dL (ref 8.7–10.3)
Chloride: 102 mmol/L (ref 96–106)
Creatinine, Ser: 0.8 mg/dL (ref 0.57–1.00)
Glucose: 83 mg/dL (ref 70–99)
Potassium: 3.9 mmol/L (ref 3.5–5.2)
Sodium: 141 mmol/L (ref 134–144)
eGFR: 71 mL/min/{1.73_m2} (ref >59)

## 2023-05-15 LAB — PRO B NATRIURETIC PEPTIDE: NT-Pro BNP: 509 pg/mL (ref 0–738)

## 2023-05-20 ENCOUNTER — Other Ambulatory Visit: Payer: Self-pay | Admitting: Family

## 2023-05-20 DIAGNOSIS — B3731 Acute candidiasis of vulva and vagina: Secondary | ICD-10-CM

## 2023-05-28 ENCOUNTER — Ambulatory Visit (INDEPENDENT_AMBULATORY_CARE_PROVIDER_SITE_OTHER): Payer: PPO | Admitting: Sports Medicine

## 2023-05-28 ENCOUNTER — Encounter: Payer: Self-pay | Admitting: Sports Medicine

## 2023-05-28 VITALS — BP 132/70 | HR 61 | Temp 97.6°F | Resp 16 | Ht 65.0 in | Wt 177.8 lb

## 2023-05-28 DIAGNOSIS — D696 Thrombocytopenia, unspecified: Secondary | ICD-10-CM | POA: Diagnosis not present

## 2023-05-28 DIAGNOSIS — M79604 Pain in right leg: Secondary | ICD-10-CM | POA: Diagnosis not present

## 2023-05-28 DIAGNOSIS — I5032 Chronic diastolic (congestive) heart failure: Secondary | ICD-10-CM | POA: Diagnosis not present

## 2023-05-28 NOTE — Progress Notes (Signed)
Careteam: Patient Care Team: [name], [name], NP as PCP - General (Family Medicine) Croitoru, Rachelle Hora, MD as PCP - Cardiology (Cardiology) Croitoru, Rachelle Hora, MD as Consulting Physician (Cardiology) Cherlyn Roberts, MD as Referring Physician (Dermatology) Park Liter, DPM (Inactive) as Consulting Physician (Podiatry) [place], [name], OD (Optometry) Jerrell Mylar, MD as Referring Physician (Gynecology)  PLACE OF SERVICE:  [facility]  Advanced Directive information Does Patient Have a Medical Advance Directive?: Yes, Type of Advance Directive: Out of facility DNR (pink MOST or yellow form), Does patient want to make changes to medical advance directive?: No - Patient declined  Allergies  Allergen Reactions   Atorvastatin Anaphylaxis and Other (See Comments)    Patient does not remember; caused pneumonia- took her off of it per patient   Penicillins Anaphylaxis, Hives, Swelling and Other (See Comments)    Tongue swelling Did it involve swelling of the face/tongue/throat, SOB, or low BP? Yes Did it involve sudden or severe rash/hives, skin peeling, or any reaction on the inside of your mouth or nose? Yes Did you need to seek medical attention at a hospital or doctor's office? Yes When did it last happen?  18 or 89 years old    If all above answers are "NO", may proceed with cephalosporin use.    Shellfish Allergy Anaphylaxis, Nausea And Vomiting and Other (See Comments)    Severe nausea and vomiting   Aminophylline Itching, Swelling, Rash and Other (See Comments)    Pt experienced burning urination, itching, redness/rash, and swelling of genitals after given this medication via IV push.   Iodinated Contrast Media Swelling and Other (See Comments)    FLUSHING, also; LLE became swollen   Latex Other (See Comments)    Causes blisters   Oxybutynin Chloride Other (See Comments)    "blisters"   Propoxyphene Other (See Comments)    Unknown reaction   Vancomycin Hives and  Other (See Comments)    06/02/2018- Hives arm, back and chest   Adhesive [Tape] Rash and Other (See Comments)    Paper tape is ok   Betadine [Povidone Iodine] Rash   Ciprofloxacin Hives   Codeine Nausea And Vomiting    .   Ranolazine Other (See Comments)    Constipation  .    Chief Complaint  Patient presents with   Acute Visit    Patient has concerns about right leg/foot.     HPI: Patient is a 89 y.o. female with a past medical history of CAD, CHF, hypertension presents to clinic complaining of right leg pain Patient reports that she woke up this morning with bruising on her right shin bone. 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  Ambulates with a walker Reports chronic h/o Rt foot pain , no swelling noted C/o mild pain when walking for long distances  CBC    Component Value Date/Time   WBC 5.3 02/08/2023 1449   RBC 4.35 02/08/2023 1449   HGB 14.2 02/08/2023 1449   HGB 15.0 04/14/2021 1541   HGB 14.8 04/03/2021 1233   HCT 42.3 02/08/2023 1449   HCT 46.6 04/03/2021 1233   PLT 110 (L) 02/08/2023 1449   PLT 133 (L) 04/14/2021 1541   PLT 128 (L) 04/03/2021 1233   MCV 97.2 02/08/2023 1449   MCV 100 (H) 04/03/2021 1233   MCH  32.6 02/08/2023 1449   MCHC 33.6 02/08/2023 1449   RDW 11.8 02/08/2023 1449   RDW 12.5 04/03/2021 1233   LYMPHSABS 1,479 02/08/2023 1449   LYMPHSABS 1.7 12/12/2017 1321   MONOABS 0.5 11/15/2022 0453   EOSABS 302 02/08/2023 1449   EOSABS 0.3 12/12/2017 1321   BASOSABS 53 02/08/2023 1449   BASOSABS 0.1 12/12/2017 1321    H/O CHF  Reports no change in her breathing  No pedal oedema Followed with cardiology recently    Review of Systems:  Review of Systems  Constitutional:  Negative for chills and fever.  HENT:  Negative for congestion and sore throat.   Eyes:  Negative for double  vision.  Respiratory:  Negative for cough, sputum production and shortness of breath.   Cardiovascular:  Negative for chest pain, palpitations and leg swelling.  Gastrointestinal:  Negative for abdominal pain, heartburn and nausea.  Genitourinary:  Negative for dysuria, frequency and hematuria.  Musculoskeletal:  Positive for joint pain. Negative for back pain, falls and myalgias.  Skin:        bruises  Neurological:  Negative for dizziness, sensory change and focal weakness.    Past Medical History:  Diagnosis Date   Arthritis    right knee   Cancer (HCC) yrs ago   skin cancer removed from face   Chronic diastolic CHF (congestive heart failure) (HCC)    Chronic venous insufficiency    LEA VENOUS, 10/17/2011 - mild reflux in bilateral common femoral veins   CKD (chronic kidney disease), stage III (HCC)    Coronary artery disease    a. s/p PCI/BMS to prox LAD and balloon angioplasty to mLAD with suboptimal result in 2000. b. Abnl nuc 2012, cath 09/2011 - showed that the overall territory of potential ischemia was small and attributable to a moderately diseased small second diagonal artery. Med rx.   Dyspnea    with activity   Headache    History of blood transfusion 2017   Hyperlipidemia    Hypertension    Hypertensive heart disease    Hypothyroidism    Obesity    Pneumonia    several times   Stroke [facility])    pt. states she had "light stroke" in sept. 1980   Urinary incontinence    Past Surgical History:  Procedure Laterality Date   ABDOMINAL HYSTERECTOMY  1970's   complete   ANKLE ARTHROSCOPY Right 07/10/2017   Procedure: ANKLE ARTHROSCOPY;  Surgeon: Park Liter, DPM;  Location: [facility] OR;  Service: Podiatry;  Laterality: Right;   BACK SURGERY  07/26/2016   cervical neck surgery, [facility]   BALLOON DILATION N/A 12/01/2021   Procedure: BALLOON DILATION;  Surgeon: Jeani Hawking, MD;  Location: [facility] ENDOSCOPY;  Service: Gastroenterology;  Laterality: N/A;    BLADDER SURGERY  2010   WITH MESH  bladder tach   bunion removal surgery Bilateral 15 yrs ago   CARDIAC CATHETERIZATION Left 09/25/2011   Medical management   CARDIAC CATHETERIZATION Left 03/25/2001   Normal LV function, LAD residual narrowing of less than 10%, normal ramus intermediate, circumflex, and RCA,    CARDIAC CATHETERIZATION  09/04/1999   LAD, 3x55mm Tetra stent resulting in a reduction of the 80% stenosis to 0% residual   CARDIAC CATHETERIZATION N/A 01/26/2015   Procedure: Right/Left Heart Cath and Coronary Angiography;  Surgeon: Lennette Bihari, MD;  Location: [facility] INVASIVE CV LAB;  Service: Cardiovascular;  Laterality: N/A;   CARDIAC CATHETERIZATION N/A 01/27/2015   Procedure: Intravascular Pressure Wire/FFR  Study;  Surgeon: Kathleene Hazel, MD;  Location: [facility] INVASIVE CV LAB;  Service: Cardiovascular;  Laterality: N/A;   CARDIAC CATHETERIZATION N/A 01/27/2015   Procedure: Right Heart Cath;  Surgeon: Kathleene Hazel, MD;  Location: [facility] INVASIVE CV LAB;  Service: Cardiovascular;  Laterality: N/A;   CHOLECYSTECTOMY     COLONOSCOPY WITH PROPOFOL N/A 03/07/2017   Procedure: COLONOSCOPY WITH PROPOFOL;  Surgeon: Charna Elizabeth, MD;  Location: [facility] END[facility];  Service: Endoscopy;  Laterality: N/A;   CORONARY PRESSURE/FFR STUDY N/A 04/05/2017   Procedure: Intravascular Pressure Wire/FFR Study;  Surgeon: Swaziland, Peter M, MD;  Location: [facility] INVASIVE CV LAB;  Service: Cardiovascular;  Laterality: N/A;   CORONARY STENT PLACEMENT     LAD x 1   ESOPHAGOGASTRODUODENOSCOPY (EGD) WITH PROPOFOL N/A 12/01/2021   Procedure: ESOPHAGOGASTRODUODENOSCOPY (EGD) WITH PROPOFOL;  Surgeon: Jeani Hawking, MD;  Location: [facility];  Service: Gastroenterology;  Laterality: N/A;   ganglion cyst removal  yrs ago   x 2   ILIAC VEIN ANGIOPLASTY / STENTING  02/15/2015   IVC FILTER INSERTION  2016   IVC FILTER REMOVAL  02/15/2015   at [facility]   LEFT HEART CATH AND CORONARY ANGIOGRAPHY N/A 04/05/2017   Procedure: Left  Heart Cath and Coronary Angiography;  Surgeon: Swaziland, Peter M, MD;  Location: [facility] INVASIVE CV LAB;  Service: Cardiovascular;  Laterality: N/A;   LEFT HEART CATHETERIZATION WITH CORONARY ANGIOGRAM N/A 09/25/2011   Procedure: LEFT HEART CATHETERIZATION WITH CORONARY ANGIOGRAM;  Surgeon: Thurmon Fair, MD;  Location: [facility] CATH LAB;  Service: Cardiovascular;  Laterality: N/A;   multiple bladder surgeries to remove mesh     ROTATOR CUFF REPAIR Right    stent to groin Left 08/2014   left leg   TENDON REPAIR Right 07/10/2017   Procedure: RIGHT PERONEAL TENDON REPAIR;  Surgeon: Park Liter, DPM;  Location: [facility] OR;  Service: Podiatry;  Laterality: Right;   TENDON REPAIR Right 05/27/2019   Procedure: PERONEAL TENDON REPAIR x2;  Surgeon: Park Liter, DPM;  Location: [facility] ORS;  Service: Podiatry;  Laterality: Right;   Social History:   reports that she has never smoked. She has never used smokeless tobacco. She reports that she does not drink alcohol and does not use drugs.  Family History  Problem Relation Age of Onset   Cancer Mother    Heart disease Father    Heart disease Sister    High blood pressure Sister    Heart disease Sister    Cancer Sister    Heart disease Brother    Diabetes Brother    Brain cancer Brother    High blood pressure Brother    Heart disease Brother    Heart attack Brother    Heart disease Brother    High blood pressure Brother     Medications: Patient's Medications  New Prescriptions   No medications on file  Previous Medications   CHOLECALCIFEROL (VITAMIN D3) 25 MCG (1000 UNIT) TABLET    Take 1,000 Units by mouth daily.    CYANOCOBALAMIN (VITAMIN B-12 PO)    Take 1 tablet by mouth daily.   EZETIMIBE (ZETIA) 10 MG TABLET    Take 1 tablet (10 mg total) by mouth every morning.   ISOSORBIDE MONONITRATE (IMDUR) 30 MG 24 HR TABLET    Take 1 tablet (30 mg total) by mouth daily.   KETOCONAZOLE (NIZORAL) 2 % SHAMPOO    Apply 1 application  topically daily as needed  for irritation (or itching- SHAMPOO).   LATANOPROST (  XALATAN) 0.005 % OPHTHALMIC SOLUTION    Place 1 drop into both eyes at bedtime.   LEVOTHYROXINE (SYNTHROID) 75 MCG TABLET    Take 1 tablet (75 mcg total) by mouth daily before breakfast.   NYSTATIN CREAM (MYCOSTATIN)    APPLY 1 APPLICATION TOPICALLY TWICE DAILY *REFILL REQUEST*   OVER THE COUNTER MEDICATION    Take 1 capsule by mouth daily. OmegaXL joint and muscle support   POTASSIUM CHLORIDE SA (KLOR-CON M) 20 MEQ TABLET    TAKE ONE TABLET BY MOUTH TWICE DAILY   PRAVASTATIN (PRAVACHOL) 80 MG TABLET    Take 80 mg by mouth at bedtime.   SERTRALINE (ZOLOFT) 25 MG TABLET    TAKE ONE TABLET BY MOUTH ONCE DAILY   TORSEMIDE (DEMADEX) 100 MG TABLET    Take 0.5 tablets (50 mg total) by mouth daily.   TYLENOL 8 HOUR ARTHRITIS PAIN 650 MG CR TABLET    Take 650 mg by mouth every 8 (eight) hours as needed for pain.   VITAMIN C (ASCORBIC ACID) 500 MG TABLET    Take 500 mg by mouth daily.  Modified Medications   No medications on file  Discontinued Medications   No medications on file    Physical Exam:  Vitals:   05/28/23 1003  BP: 132/70  Pulse: 61  Resp: 16  Temp: 97.6 F (36.4 C)  SpO2: 94%  Weight: 177 lb 12.8 oz (80.6 kg)  Height: 5\' 5"  (1.651 m)   Body mass index is 29.59 kg/m. Wt Readings from Last 3 Encounters:  05/28/23 177 lb 12.8 oz (80.6 kg)  04/24/23 177 lb 3.2 oz (80.4 kg)  03/28/23 176 lb (79.8 kg)    Physical Exam Constitutional:      Appearance: Normal appearance.  Cardiovascular:     Rate and Rhythm: Normal rate and regular rhythm.     Pulses: Normal pulses.     Heart sounds: Normal heart sounds.  Pulmonary:     Effort: No respiratory distress.     Breath sounds: No stridor. No wheezing or rales.  Abdominal:     General: Bowel sounds are normal. There is no distension.     Palpations: Abdomen is soft.     Tenderness: There is no abdominal tenderness. There is no guarding.  Musculoskeletal:     Comments: Rt  foot - non tender , rom wnl  Rt leg - 4cm bruise on ant shin bone  Tender to palpation   Pedal pulse intact   Neurological:     Mental Status: She is alert.     Labs reviewed: Basic Metabolic Panel: Recent Labs    09/04/22 0938 11/12/22 1915 11/15/22 0453 12/11/22 1459 02/04/23 1156 03/04/23 1448 04/23/23 1125 05/14/23 1102  NA 142   < > 137 143 141  --   --  141  K 3.4*   < > 3.8 3.4* 3.8  --   --  3.9  CL 101   < > 100 97* 98  --   --  102  CO2 33*   < > 30 34* 30*  --   --  30*  GLUCOSE 101*   < > 98 86 97  --   --  83  BUN 18   < > 15 14 12   --   --  19  CREATININE 0.82   < > 0.74 0.76 0.74  --   --  0.80  CALCIUM 9.5   < > 8.5* 9.7 9.8  --   --  9.5  MG  --   --  2.0  --   --   --   --   --   TSH 0.08*  --   --   --   --  0.20* 0.61  --    < > = values in this interval not displayed.   Liver Function Tests: Recent Labs    09/04/22 0938 11/12/22 1915 11/13/22 0624  AST 32 40 30  ALT 15 17 16   ALKPHOS  --  79 71  BILITOT 1.4* 1.2 1.2  PROT 6.2 5.6* 5.7*  ALBUMIN  --  2.9* 2.8*   No results for input(s): "LIPASE", "AMYLASE" in the last 8760 hours. No results for input(s): "AMMONIA" in the last 8760 hours. CBC: Recent Labs    11/12/22 1915 11/13/22 0624 11/15/22 0453 02/08/23 1449  WBC 7.4 5.9 5.7 5.3  NEUTROABS 6.2  --  3.6 2,995  HGB 13.4 13.0 12.4 14.2  HCT 41.4 39.5 37.8 42.3  MCV 102.5* 100.5* 100.5* 97.2  PLT 79* 73* 78* 110*   Lipid Panel: Recent Labs    09/04/22 0938 02/04/23 1156  CHOL 162 125  HDL 52 64  LDLCALC 87 44  TRIG 131 90  CHOLHDL 3.1 2.0   TSH: Recent Labs    09/04/22 0938 03/04/23 1448 04/23/23 1125  TSH 0.08* 0.20* 0.61   A1C: Lab Results  Component Value Date   HGBA1C 6.1 (H) 01/26/2015     Assessment/Plan   1. Thrombocytopenia (HCC) Pt reports easy bruising  Denies bleeding from gums, hematuria, bloody or dark stools - CBC With Differential/Platelet - Hepatic Function Panel  2. Leg pain,  anterior, right Will get x ray Rt tibia - DG Tibia/Fibula Right; Future   Chronic diastolic heart failure Lungs clear  No pedal odema Cont with zetia, imdur, demadex  No follow-ups on file.: follow up with PCP

## 2023-05-29 ENCOUNTER — Other Ambulatory Visit: Payer: Self-pay | Admitting: Sports Medicine

## 2023-05-29 DIAGNOSIS — D696 Thrombocytopenia, unspecified: Secondary | ICD-10-CM

## 2023-05-29 LAB — HEPATIC FUNCTION PANEL
AG Ratio: 1.3 (calc) (ref 1.0–2.5)
ALT: 19 U/L (ref 6–29)
AST: 29 U/L (ref 10–35)
Albumin: 3.5 g/dL — ABNORMAL LOW (ref 3.6–5.1)
Alkaline phosphatase (APISO): 96 U/L (ref 37–153)
Bilirubin, Direct: 0.3 mg/dL — ABNORMAL HIGH (ref 0.0–0.2)
Globulin: 2.6 g/dL (ref 1.9–3.7)
Indirect Bilirubin: 0.9 mg/dL (ref 0.2–1.2)
Total Bilirubin: 1.2 mg/dL (ref 0.2–1.2)
Total Protein: 6.1 g/dL (ref 6.1–8.1)

## 2023-05-29 LAB — CBC WITH DIFFERENTIAL/PLATELET
Absolute Monocytes: 448 {cells}/uL (ref 200–950)
Basophils Absolute: 38 {cells}/uL (ref 0–200)
Basophils Relative: 0.7 %
Eosinophils Absolute: 167 {cells}/uL (ref 15–500)
Eosinophils Relative: 3.1 %
HCT: 43.9 % (ref 35.0–45.0)
Hemoglobin: 14.7 g/dL (ref 11.7–15.5)
Lymphs Abs: 1080 {cells}/uL (ref 850–3900)
MCH: 33.3 pg — ABNORMAL HIGH (ref 27.0–33.0)
MCHC: 33.5 g/dL (ref 32.0–36.0)
MCV: 99.5 fL (ref 80.0–100.0)
MPV: 11.1 fL (ref 7.5–12.5)
Monocytes Relative: 8.3 %
Neutro Abs: 3667 {cells}/uL (ref 1500–7800)
Neutrophils Relative %: 67.9 %
Platelets: 94 10*3/uL — ABNORMAL LOW (ref 140–400)
RBC: 4.41 10*6/uL (ref 3.80–5.10)
RDW: 12.5 % (ref 11.0–15.0)
Total Lymphocyte: 20 %
WBC: 5.4 10*3/uL (ref 3.8–10.8)

## 2023-05-30 ENCOUNTER — Telehealth: Payer: Self-pay | Admitting: Sports Medicine

## 2023-05-30 DIAGNOSIS — D696 Thrombocytopenia, unspecified: Secondary | ICD-10-CM

## 2023-05-30 NOTE — Telephone Encounter (Signed)
Below is Dr.Veludandi's response:  Venita Sheffield, MD  [facility] Pool27 minutes ago (9:04 AM)    I placed a referral yesterday

## 2023-05-30 NOTE — Telephone Encounter (Signed)
-----   Message from [facility] D sent at 05/29/2023  4:05 PM EDT ----- Called patient and lab results were discussed and understood. Patient agreeable to see Hematologist. Please place referral. Message routed back to Dr.Alise Calais

## 2023-05-31 ENCOUNTER — Telehealth: Payer: Self-pay | Admitting: Physician Assistant

## 2023-06-02 ENCOUNTER — Other Ambulatory Visit: Payer: Self-pay | Admitting: Physician Assistant

## 2023-06-02 DIAGNOSIS — D696 Thrombocytopenia, unspecified: Secondary | ICD-10-CM

## 2023-06-04 ENCOUNTER — Inpatient Hospital Stay: Payer: PPO

## 2023-06-04 ENCOUNTER — Inpatient Hospital Stay: Payer: PPO | Admitting: Physician Assistant

## 2023-06-04 ENCOUNTER — Inpatient Hospital Stay: Payer: PPO | Attending: Physician Assistant

## 2023-06-04 VITALS — BP 168/75 | HR 60 | Temp 98.1°F | Resp 13 | Wt 177.3 lb

## 2023-06-04 DIAGNOSIS — K7460 Unspecified cirrhosis of liver: Secondary | ICD-10-CM | POA: Diagnosis not present

## 2023-06-04 DIAGNOSIS — D732 Chronic congestive splenomegaly: Secondary | ICD-10-CM | POA: Insufficient documentation

## 2023-06-04 DIAGNOSIS — D696 Thrombocytopenia, unspecified: Secondary | ICD-10-CM

## 2023-06-04 LAB — CBC WITH DIFFERENTIAL (CANCER CENTER ONLY)
Abs Immature Granulocytes: 0.01 10*3/uL (ref 0.00–0.07)
Basophils Absolute: 0 10*3/uL (ref 0.0–0.1)
Basophils Relative: 0 %
Eosinophils Absolute: 0.1 10*3/uL (ref 0.0–0.5)
Eosinophils Relative: 3 %
HCT: 43.6 % (ref 36.0–46.0)
Hemoglobin: 14.4 g/dL (ref 12.0–15.0)
Immature Granulocytes: 0 %
Lymphocytes Relative: 22 %
Lymphs Abs: 1.1 10*3/uL (ref 0.7–4.0)
MCH: 33.6 pg (ref 26.0–34.0)
MCHC: 33 g/dL (ref 30.0–36.0)
MCV: 101.6 fL — ABNORMAL HIGH (ref 80.0–100.0)
Monocytes Absolute: 0.4 10*3/uL (ref 0.1–1.0)
Monocytes Relative: 8 %
Neutro Abs: 3.2 10*3/uL (ref 1.7–7.7)
Neutrophils Relative %: 67 %
Platelet Count: 75 10*3/uL — ABNORMAL LOW (ref 150–400)
RBC: 4.29 MIL/uL (ref 3.87–5.11)
RDW: 14.1 % (ref 11.5–15.5)
Smear Review: DECREASED
WBC Count: 4.8 10*3/uL (ref 4.0–10.5)
nRBC: 0 % (ref 0.0–0.2)

## 2023-06-04 LAB — CMP (CANCER CENTER ONLY)
ALT: 20 U/L (ref 0–44)
AST: 32 U/L (ref 15–41)
Albumin: 3.4 g/dL — ABNORMAL LOW (ref 3.5–5.0)
Alkaline Phosphatase: 119 U/L (ref 38–126)
Anion gap: 6 (ref 5–15)
BUN: 19 mg/dL (ref 8–23)
CO2: 33 mmol/L — ABNORMAL HIGH (ref 22–32)
Calcium: 9.2 mg/dL (ref 8.9–10.3)
Chloride: 103 mmol/L (ref 98–111)
Creatinine: 0.85 mg/dL (ref 0.44–1.00)
GFR, Estimated: >60 mL/min (ref >60)
Glucose, Bld: 116 mg/dL — ABNORMAL HIGH (ref 70–99)
Potassium: 3.3 mmol/L — ABNORMAL LOW (ref 3.5–5.1)
Sodium: 142 mmol/L (ref 135–145)
Total Bilirubin: 1.3 mg/dL — ABNORMAL HIGH (ref 0.3–1.2)
Total Protein: 6.2 g/dL — ABNORMAL LOW (ref 6.5–8.1)

## 2023-06-04 LAB — VITAMIN B12: Vitamin B-12: 441 pg/mL (ref 180–914)

## 2023-06-04 LAB — FOLATE: Folate: 12.1 ng/mL (ref >5.9)

## 2023-06-04 LAB — IMMATURE PLATELET FRACTION: Immature Platelet Fraction: 3.9 % (ref 1.2–8.6)

## 2023-06-04 NOTE — Progress Notes (Signed)
[name] [phone]   [phone]  PROGRESS NOTE  Patient Care Team: [name], [name], NP as PCP - General (Family Medicine) Croitoru, Rachelle Hora, MD as PCP - Cardiology (Cardiology) Croitoru, Rachelle Hora, MD as Consulting Physician (Cardiology) Cherlyn Roberts, MD as Referring Physician (Dermatology) Park Liter, DPM (Inactive) as Consulting Physician (Podiatry) [place], [name], OD (Optometry) Jerrell Mylar, MD as Referring Physician (Gynecology)  Hematological/Oncological History 1) Prior labs: -08/25/2020: WBC 6.1, Hgb 15.1, Plt 140 (L) -09/16/2020: WBC 3.9 (L), 15.5 (H), Plt 91 (L) -02/27/2021: WBC 4.7, Hgb 15.9, Plt 126 (L) -04/03/2021: WBC 4.9, Hgb 14.8, Plt 128 (L)  2) 04/14/2021: Establish care wit Georga Kaufmann PA-C  CHIEF COMPLAINTS/PURPOSE OF CONSULTATION:  "Thrombocytopenia"  HISTORY OF PRESENTING ILLNESS:  Jill Preston 89 y.o. female returns for a follow up for thrombocytopenia. She was last seen on 04/14/2021.   On exam today, [name] reports she continues to bruise easily but denies any overt signs of bleeding. She reports that she noticed a large bruise on her right leg last week without any known trauma. She uses a walker to assist with ambulation. She denies any nausea, vomiting or bowel habit changes. Patient has chronic lower extremity neuropathy that does affect her balance. She denies any fevers, chills, night sweats, shortness of breath, chest pain or cough. She has no other complaints. Rest of the 10 point ROS is below.   MEDICAL HISTORY:  Past Medical History:  Diagnosis Date   Arthritis    right knee   Cancer (HCC) yrs ago   skin cancer removed from face   Chronic diastolic CHF (congestive heart failure) (HCC)    Chronic venous insufficiency    LEA VENOUS, 10/17/2011 - mild reflux in bilateral common femoral veins   CKD (chronic kidney disease), stage III (HCC)    Coronary artery disease    a. s/p  PCI/BMS to prox LAD and balloon angioplasty to mLAD with suboptimal result in 2000. b. Abnl nuc 2012, cath 09/2011 - showed that the overall territory of potential ischemia was small and attributable to a moderately diseased small second diagonal artery. Med rx.   Dyspnea    with activity   Headache    History of blood transfusion 2017   Hyperlipidemia    Hypertension    Hypertensive heart disease    Hypothyroidism    Obesity    Pneumonia    several times   Stroke [facility])    pt. states she had "light stroke" in sept. 1980   Urinary incontinence     SURGICAL HISTORY: Past Surgical History:  Procedure Laterality Date   ABDOMINAL HYSTERECTOMY  1970's   complete   ANKLE ARTHROSCOPY Right 07/10/2017   Procedure: ANKLE ARTHROSCOPY;  Surgeon: Park Liter, DPM;  Location: [facility] OR;  Service: Podiatry;  Laterality: Right;   BACK SURGERY  07/26/2016   cervical neck surgery, [facility]   BALLOON DILATION N/A 12/01/2021   Procedure: BALLOON DILATION;  Surgeon: Jeani Hawking, MD;  Location: [facility] ENDOSCOPY;  Service: Gastroenterology;  Laterality: N/A;   BLADDER SURGERY  2010   WITH MESH  bladder tach   bunion removal surgery Bilateral 15 yrs ago   CARDIAC CATHETERIZATION Left 09/25/2011   Medical management   CARDIAC CATHETERIZATION Left 03/25/2001   Normal LV function, LAD residual narrowing of less than 10%, normal ramus intermediate, circumflex, and RCA,    CARDIAC CATHETERIZATION  09/04/1999   LAD, 3x79mm Tetra stent resulting in a reduction of  the 80% stenosis to 0% residual   CARDIAC CATHETERIZATION N/A 01/26/2015   Procedure: Right/Left Heart Cath and Coronary Angiography;  Surgeon: Lennette Bihari, MD;  Location: [facility] INVASIVE CV LAB;  Service: Cardiovascular;  Laterality: N/A;   CARDIAC CATHETERIZATION N/A 01/27/2015   Procedure: Intravascular Pressure Wire/FFR Study;  Surgeon: Kathleene Hazel, MD;  Location: [facility] INVASIVE CV LAB;  Service: Cardiovascular;  Laterality: N/A;    CARDIAC CATHETERIZATION N/A 01/27/2015   Procedure: Right Heart Cath;  Surgeon: Kathleene Hazel, MD;  Location: [facility] INVASIVE CV LAB;  Service: Cardiovascular;  Laterality: N/A;   CHOLECYSTECTOMY     COLONOSCOPY WITH PROPOFOL N/A 03/07/2017   Procedure: COLONOSCOPY WITH PROPOFOL;  Surgeon: Charna Elizabeth, MD;  Location: [facility];  Service: Endoscopy;  Laterality: N/A;   CORONARY PRESSURE/FFR STUDY N/A 04/05/2017   Procedure: Intravascular Pressure Wire/FFR Study;  Surgeon: Swaziland, Peter M, MD;  Location: [facility] INVASIVE CV LAB;  Service: Cardiovascular;  Laterality: N/A;   CORONARY STENT PLACEMENT     LAD x 1   ESOPHAGOGASTRODUODENOSCOPY (EGD) WITH PROPOFOL N/A 12/01/2021   Procedure: ESOPHAGOGASTRODUODENOSCOPY (EGD) WITH PROPOFOL;  Surgeon: Jeani Hawking, MD;  Location: [facility];  Service: Gastroenterology;  Laterality: N/A;   ganglion cyst removal  yrs ago   x 2   ILIAC VEIN ANGIOPLASTY / STENTING  02/15/2015   IVC FILTER INSERTION  2016   IVC FILTER REMOVAL  02/15/2015   at [facility]   LEFT HEART CATH AND CORONARY ANGIOGRAPHY N/A 04/05/2017   Procedure: Left Heart Cath and Coronary Angiography;  Surgeon: Swaziland, Peter M, MD;  Location: [facility] INVASIVE CV LAB;  Service: Cardiovascular;  Laterality: N/A;   LEFT HEART CATHETERIZATION WITH CORONARY ANGIOGRAM N/A 09/25/2011   Procedure: LEFT HEART CATHETERIZATION WITH CORONARY ANGIOGRAM;  Surgeon: Thurmon Fair, MD;  Location: [facility] CATH LAB;  Service: Cardiovascular;  Laterality: N/A;   multiple bladder surgeries to remove mesh     ROTATOR CUFF REPAIR Right    stent to groin Left 08/2014   left leg   TENDON REPAIR Right 07/10/2017   Procedure: RIGHT PERONEAL TENDON REPAIR;  Surgeon: Park Liter, DPM;  Location: [facility] OR;  Service: Podiatry;  Laterality: Right;   TENDON REPAIR Right 05/27/2019   Procedure: PERONEAL TENDON REPAIR x2;  Surgeon: Park Liter, DPM;  Location: [facility] ORS;  Service: Podiatry;  Laterality: Right;    SOCIAL  HISTORY: Social History   Socioeconomic History   Marital status: Married    Spouse name: Not on file   Number of children: Not on file   Years of education: Not on file   Highest education level: Not on file  Occupational History   Not on file  Tobacco Use   Smoking status: Never   Smokeless tobacco: Never  Vaping Use   Vaping status: Never Used  Substance and Sexual Activity   Alcohol use: No   Drug use: No   Sexual activity: Not on file  Other Topics Concern   Not on file  Social History Narrative   Per [facility] New Patient Packet Abstracted on 03/06/2021      Diet: Left blank       Caffeine: No      Married, if yes what year: Yes, 1955      Do you live in a house, apartment, assisted living, condo, trailer, ect: House      Is it one or more stories: 1 story      How many persons live in your home?  2      Pets: No      Highest level or education completed: 12 th grade      Current/Past profession: Research officer, political party       Exercise:          Yes       Type and how often: Walking          Living Will: Yes   DNR: No   POA/HPOA: Yes      Functional Status:   Do you have difficulty bathing or dressing yourself? Left Blank    Do you have difficulty preparing food or eating?Left Blank   Do you have difficulty managing your medications?Left Blank   Do you have difficulty managing your finances?Left Blank   Do you have difficulty affording your medications?Left Blank   Social Determinants of Health   Financial Resource Strain: Not on file  Food Insecurity: Food Insecurity Present (11/13/2022)   Hunger Vital Sign    Worried About Running Out of Food in the Last Year: Sometimes true    Ran Out of Food in the Last Year: Sometimes true  Transportation Needs: No Transportation Needs (11/13/2022)   PRAPARE - Administrator, Civil Service (Medical): No    Lack of Transportation (Non-Medical): No  Physical Activity: Not on file  Stress: Not on file   Social Connections: Not on file  Intimate Partner Violence: Not At Risk (11/13/2022)   Humiliation, Afraid, Rape, and Kick questionnaire    Fear of Current or Ex-Partner: No    Emotionally Abused: No    Physically Abused: No    Sexually Abused: No    FAMILY HISTORY: Family History  Problem Relation Age of Onset   Cancer Mother    Heart disease Father    Heart disease Sister    High blood pressure Sister    Heart disease Sister    Cancer Sister    Heart disease Brother    Diabetes Brother    Brain cancer Brother    High blood pressure Brother    Heart disease Brother    Heart attack Brother    Heart disease Brother    High blood pressure Brother     ALLERGIES:  is allergic to atorvastatin, penicillins, shellfish allergy, aminophylline, iodinated contrast media, latex, oxybutynin chloride, propoxyphene, vancomycin, adhesive [tape], betadine [povidone iodine], ciprofloxacin, codeine, and ranolazine.  MEDICATIONS:  Current Outpatient Medications  Medication Sig Dispense Refill   cholecalciferol (VITAMIN D3) 25 MCG (1000 UNIT) tablet Take 1,000 Units by mouth daily.      Cyanocobalamin (VITAMIN B-12 PO) Take 1 tablet by mouth daily.     ezetimibe (ZETIA) 10 MG tablet Take 1 tablet (10 mg total) by mouth every morning. 90 tablet 3   isosorbide mononitrate (IMDUR) 30 MG 24 hr tablet Take 1 tablet (30 mg total) by mouth daily.     ketoconazole (NIZORAL) 2 % shampoo Apply 1 application  topically daily as needed for irritation (or itching- SHAMPOO).     latanoprost (XALATAN) 0.005 % ophthalmic solution Place 1 drop into both eyes at bedtime.     levothyroxine (SYNTHROID) 75 MCG tablet Take 1 tablet (75 mcg total) by mouth daily before breakfast. 90 tablet 1   nystatin cream (MYCOSTATIN) APPLY 1 APPLICATION TOPICALLY TWICE DAILY *REFILL REQUEST* 30 g 2   OVER THE COUNTER MEDICATION Take 1 capsule by mouth daily. OmegaXL joint and muscle support     potassium chloride SA (KLOR-CON M)  20  MEQ tablet TAKE ONE TABLET BY MOUTH TWICE DAILY 180 tablet 3   pravastatin (PRAVACHOL) 80 MG tablet Take 80 mg by mouth at bedtime.     sertraline (ZOLOFT) 25 MG tablet TAKE ONE TABLET BY MOUTH ONCE DAILY 90 tablet 1   torsemide (DEMADEX) 100 MG tablet Take 0.5 tablets (50 mg total) by mouth daily. 45 tablet 3   TYLENOL 8 HOUR ARTHRITIS PAIN 650 MG CR tablet Take 650 mg by mouth every 8 (eight) hours as needed for pain.     vitamin C (ASCORBIC ACID) 500 MG tablet Take 500 mg by mouth daily.     No current facility-administered medications for this visit.    REVIEW OF SYSTEMS:   Constitutional: ( - ) fevers, ( - )  chills , ( - ) night sweats Eyes: ( - ) blurriness of vision, ( - ) double vision, ( - ) watery eyes Ears, nose, mouth, throat, and face: ( - ) mucositis, ( - ) sore throat Respiratory: ( - ) cough, ( - ) dyspnea, ( - ) wheezes Cardiovascular: ( - ) palpitation, ( - ) chest discomfort, ( - ) lower extremity swelling Gastrointestinal:  ( - ) nausea, ( - ) heartburn, ( - ) change in bowel habits Skin: ( - ) abnormal skin rashes Lymphatics: ( - ) new lymphadenopathy, ( + ) easy bruising Neurological: ( - ) numbness, ( - ) tingling, ( - ) new weaknesses Behavioral/Psych: ( - ) mood change, ( - ) new changes  All other systems were reviewed with the patient and are negative.  PHYSICAL EXAMINATION: ECOG PERFORMANCE STATUS: 1 - Symptomatic but completely ambulatory  Vitals:   06/04/23 1100  BP: (!) 168/75  Pulse: 60  Resp: 13  Temp: 98.1 F (36.7 C)  SpO2: 96%   Filed Weights   06/04/23 1100  Weight: 177 lb 4.8 oz (80.4 kg)    GENERAL: well appearing female in NAD  SKIN: skin color, texture, turgor are normal, no rashes or significant lesions. Ecchymosis noted in arms and legs.  EYES: conjunctiva are pink and non-injected, sclera clear LUNGS: clear to auscultation and percussion with normal breathing effort HEART: regular rate & rhythm and no murmurs and no lower  extremity edema Musculoskeletal: no cyanosis of digits and no clubbing  PSYCH: alert & oriented x 3, fluent speech NEURO: no focal motor/sensory deficits  LABORATORY DATA:  I have reviewed the data as listed    Latest Ref Rng & Units 06/04/2023   10:27 AM 05/28/2023   10:29 AM 02/08/2023    2:49 PM  CBC  WBC 4.0 - 10.5 K/uL 4.8  5.4  5.3   Hemoglobin 12.0 - 15.0 g/dL 87.5  64.3  32.9   Hematocrit 36.0 - 46.0 % 43.6  43.9  42.3   Platelets 150 - 400 K/uL 75  94  110        Latest Ref Rng & Units 06/04/2023   10:27 AM 05/28/2023   10:29 AM 05/14/2023   11:02 AM  CMP  Glucose 70 - 99 mg/dL 518   83   BUN 8 - 23 mg/dL 19   19   Creatinine 8.41 - 1.00 mg/dL 6.60   6.30   Sodium 160 - 145 mmol/L 142   141   Potassium 3.5 - 5.1 mmol/L 3.3   3.9   Chloride 98 - 111 mmol/L 103   102   CO2 22 - 32 mmol/L 33   30  Calcium 8.9 - 10.3 mg/dL 9.2   9.5   Total Protein 6.5 - 8.1 g/dL 6.2  6.1    Total Bilirubin 0.3 - 1.2 mg/dL 1.3  1.2    Alkaline Phos 38 - 126 U/L 119     AST 15 - 41 U/L 32  29    ALT 0 - 44 U/L 20  19       RADIOGRAPHIC STUDIES: No images were reviewed during this visit.   ASSESSMENT & PLAN Jill Preston is a 89 y.o. female who presents to the clinic for evaluation for thrombocytopenia.  #Thrombocytopenia: --Secondary to cirrhosis with splenomegaly seen again on CT scan from 11/12/2022. --Labs shows platelet levels oscillate between 70-120K over the last several years. Today it is 75K.  --Patient does bruise easily but no signs of bleeding.  --Will check for other etiologies including nutritional deficiencies and immature platelet fraction.  --No indication for bone marrow biopsy at this time as low concern for underlying bone marrow disorder.  --RTC in 1 year with repeat labs (placeholder) but okay to have platelet counts monitored by PCP.   Orders Placed This Encounter  Procedures   Vitamin B12    Standing Status:   Future    Number of Occurrences:   1     Standing Expiration Date:   06/03/2024   Methylmalonic acid, serum    Standing Status:   Future    Number of Occurrences:   1    Standing Expiration Date:   06/03/2024   Folate, Serum    Standing Status:   Future    Number of Occurrences:   1    Standing Expiration Date:   06/03/2024   Immature Platelet Fraction    Standing Status:   Future    Number of Occurrences:   1    Standing Expiration Date:   06/03/2024    All questions were answered. The patient knows to call the clinic with any problems, questions or concerns.  I have spent a total of 30 minutes minutes of face-to-face and non-face-to-face time, preparing to see the patient, performing a medically appropriate examination, counseling and educating the patient, ordering tests,  documenting clinical information in the electronic health record, and care coordination.   Georga Kaufmann, PA-C Department of Hematology/Oncology [facility] Phone: [phone]

## 2023-06-06 LAB — METHYLMALONIC ACID, SERUM: Methylmalonic Acid, Quantitative: 200 nmol/L (ref 0–378)

## 2023-06-10 ENCOUNTER — Telehealth: Payer: Self-pay

## 2023-06-10 NOTE — Telephone Encounter (Signed)
Pt advised with VU

## 2023-06-11 DIAGNOSIS — E349 Endocrine disorder, unspecified: Secondary | ICD-10-CM | POA: Diagnosis not present

## 2023-06-11 DIAGNOSIS — M8588 Other specified disorders of bone density and structure, other site: Secondary | ICD-10-CM | POA: Diagnosis not present

## 2023-06-11 DIAGNOSIS — R29890 Loss of height: Secondary | ICD-10-CM | POA: Diagnosis not present

## 2023-06-11 DIAGNOSIS — Z1231 Encounter for screening mammogram for malignant neoplasm of breast: Secondary | ICD-10-CM | POA: Diagnosis not present

## 2023-06-11 LAB — HM DEXA SCAN

## 2023-06-11 LAB — HM MAMMOGRAPHY

## 2023-06-17 DIAGNOSIS — M180 Bilateral primary osteoarthritis of first carpometacarpal joints: Secondary | ICD-10-CM | POA: Diagnosis not present

## 2023-06-17 DIAGNOSIS — G5603 Carpal tunnel syndrome, bilateral upper limbs: Secondary | ICD-10-CM | POA: Diagnosis not present

## 2023-06-20 ENCOUNTER — Telehealth: Payer: Self-pay | Admitting: Cardiovascular Disease

## 2023-06-20 NOTE — Telephone Encounter (Signed)
*  STAT* If patient is at the pharmacy, call can be transferred to refill team.   1. Which medications need to be refilled? (please list name of each medication and dose if known) levothyroxine (SYNTHROID) 75 MCG tablet   2. Which pharmacy/location (including street and city if local pharmacy) is medication to be sent to?  [id number] - [place], [place] - [address]      3. Do they need a 30 day or 90 day supply? 90 day    Pt is out of this medication

## 2023-06-21 MED ORDER — LEVOTHYROXINE SODIUM 75 MCG PO TABS: 75.0000 ug | ORAL_TABLET | Freq: Every day | ORAL | 1 refills | Status: DC

## 2023-06-25 ENCOUNTER — Ambulatory Visit
Admission: RE | Admit: 2023-06-25 | Discharge: 2023-06-25 | Disposition: A | Discharge status: Home / Self Care | Payer: PPO | Source: Ambulatory Visit | Attending: Sports Medicine | Admitting: Sports Medicine

## 2023-06-25 ENCOUNTER — Encounter: Payer: Self-pay | Admitting: Sports Medicine

## 2023-06-25 ENCOUNTER — Ambulatory Visit (INDEPENDENT_AMBULATORY_CARE_PROVIDER_SITE_OTHER): Payer: HMO | Admitting: Sports Medicine

## 2023-06-25 VITALS — BP 130/64 | HR 62 | Temp 96.8°F | Resp 17 | Ht 65.0 in | Wt 177.8 lb

## 2023-06-25 DIAGNOSIS — G8929 Other chronic pain: Secondary | ICD-10-CM

## 2023-06-25 DIAGNOSIS — I10 Essential (primary) hypertension: Secondary | ICD-10-CM | POA: Diagnosis not present

## 2023-06-25 DIAGNOSIS — M25572 Pain in left ankle and joints of left foot: Secondary | ICD-10-CM | POA: Diagnosis not present

## 2023-06-25 DIAGNOSIS — M25571 Pain in right ankle and joints of right foot: Secondary | ICD-10-CM | POA: Diagnosis not present

## 2023-06-25 DIAGNOSIS — M7989 Other specified soft tissue disorders: Secondary | ICD-10-CM | POA: Diagnosis not present

## 2023-06-25 DIAGNOSIS — D696 Thrombocytopenia, unspecified: Secondary | ICD-10-CM

## 2023-06-25 MED ORDER — GABAPENTIN 300 MG PO CAPS
300.0000 mg | ORAL_CAPSULE | Freq: Every day | ORAL | 1 refills | Status: AC
Start: 2023-06-25 — End: ?

## 2023-06-25 MED ORDER — GABAPENTIN 300 MG PO CAPS
300.0000 mg | ORAL_CAPSULE | Freq: Every day | ORAL | 1 refills | Status: DC
Start: 2023-06-25 — End: 2023-06-25

## 2023-06-25 NOTE — Progress Notes (Signed)
Careteam: Patient Care Team: [name], [name], NP as PCP - General (Family Medicine) Croitoru, Rachelle Hora, MD as PCP - Cardiology (Cardiology) Croitoru, Rachelle Hora, MD as Consulting Physician (Cardiology) Cherlyn Roberts, MD as Referring Physician (Dermatology) Park Liter, DPM (Inactive) as Consulting Physician (Podiatry) Burund[place], [name], OD (Optometry) Jerrell Mylar, MD as Referring Physician (Gynecology)  PLACE OF SERVICE:  [facility]  Advanced Directive information Does Patient Have a Medical Advance Directive?: Yes, Type of Advance Directive: Out of facility DNR (pink MOST or yellow form), Does patient want to make changes to medical advance directive?: No - Patient declined  Allergies  Allergen Reactions   Atorvastatin Anaphylaxis and Other (See Comments)    Patient does not remember; caused pneumonia- took her off of it per patient   Penicillins Anaphylaxis, Hives, Swelling and Other (See Comments)    Tongue swelling Did it involve swelling of the face/tongue/throat, SOB, or low BP? Yes Did it involve sudden or severe rash/hives, skin peeling, or any reaction on the inside of your mouth or nose? Yes Did you need to seek medical attention at a hospital or doctor's office? Yes When did it last happen?  59 or 89 years old    If all above answers are "NO", may proceed with cephalosporin use.    Shellfish Allergy Anaphylaxis, Nausea And Vomiting and Other (See Comments)    Severe nausea and vomiting   Aminophylline Itching, Swelling, Rash and Other (See Comments)    Pt experienced burning urination, itching, redness/rash, and swelling of genitals after given this medication via IV push.   Iodinated Contrast Media Swelling and Other (See Comments)    FLUSHING, also; LLE became swollen   Latex Other (See Comments)    Causes blisters   Oxybutynin Chloride Other (See Comments)    "blisters"   Propoxyphene Other (See Comments)    Unknown reaction   Vancomycin Hives and  Other (See Comments)    06/02/2018- Hives arm, back and chest   Adhesive [Tape] Rash and Other (See Comments)    Paper tape is ok   Betadine [Povidone Iodine] Rash   Ciprofloxacin Hives   Codeine Nausea And Vomiting    .   Ranolazine Other (See Comments)    Constipation  .    Chief Complaint  Patient presents with   Acute Visit    Patient complains of breakout on right leg that's painful.     HPI: Patient is a 89 y.o. female presented to clinic for acute visit  BILATERAL ANKLE PAIN  Location- both her fett Duration -since 2 yrs but got worse in the last in the last 1 month  Reports a fall 3 weeks ago, lost her balance when coming inside the house but did not hit her head Aggravating factors- walking on concrete Relieving factors- pain meds, cream Medications tried- tylenol, otc cream Mobility- yes, uses walker all the time Completed physical therapy Falls- yes , 3 weeks ago   Bruising  Denies signs of bleeding States she bruises easily, she has one on Rt lower leg  Saw hematology and recommended to monitor for signs of bleeding and follow up in a yr    Review of Systems:  Review of Systems  Constitutional:  Negative for chills and fever.  HENT:  Negative for congestion and sore throat.   Eyes:  Negative for double vision.  Respiratory:  Negative for cough, sputum production and shortness of breath.   Cardiovascular:  Negative for chest pain, palpitations and leg  swelling.  Gastrointestinal:  Negative for abdominal pain, heartburn and nausea.  Genitourinary:  Negative for dysuria, frequency and hematuria.  Musculoskeletal:  Positive for joint pain. Negative for falls and myalgias.  Neurological:  Negative for dizziness, sensory change and focal weakness.  Endo/Heme/Allergies:  Bruises/bleeds easily.    Past Medical History:  Diagnosis Date   Arthritis    right knee   Cancer (HCC) yrs ago   skin cancer removed from face   Chronic diastolic CHF (congestive  heart failure) (HCC)    Chronic venous insufficiency    LEA VENOUS, 10/17/2011 - mild reflux in bilateral common femoral veins   CKD (chronic kidney disease), stage III (HCC)    Coronary artery disease    a. s/p PCI/BMS to prox LAD and balloon angioplasty to mLAD with suboptimal result in 2000. b. Abnl nuc 2012, cath 09/2011 - showed that the overall territory of potential ischemia was small and attributable to a moderately diseased small second diagonal artery. Med rx.   Dyspnea    with activity   Headache    History of blood transfusion 2017   Hyperlipidemia    Hypertension    Hypertensive heart disease    Hypothyroidism    Obesity    Pneumonia    several times   Stroke [facility])    pt. states she had "light stroke" in sept. 1980   Urinary incontinence    Past Surgical History:  Procedure Laterality Date   ABDOMINAL HYSTERECTOMY  1970's   complete   ANKLE ARTHROSCOPY Right 07/10/2017   Procedure: ANKLE ARTHROSCOPY;  Surgeon: Park Liter, DPM;  Location: [facility] OR;  Service: Podiatry;  Laterality: Right;   BACK SURGERY  07/26/2016   cervical neck surgery, [place] [facility]   BALLOON DILATION N/A 12/01/2021   Procedure: BALLOON DILATION;  Surgeon: Jeani Hawking, MD;  Location: [facility];  Service: Gastroenterology;  Laterality: N/A;   BLADDER SURGERY  2010   WITH MESH  bladder tach   bunion removal surgery Bilateral 15 yrs ago   CARDIAC CATHETERIZATION Left 09/25/2011   Medical management   CARDIAC CATHETERIZATION Left 03/25/2001   Normal LV function, LAD residual narrowing of less than 10%, normal ramus intermediate, circumflex, and RCA,    CARDIAC CATHETERIZATION  09/04/1999   LAD, 3x1mm Tetra stent resulting in a reduction of the 80% stenosis to 0% residual   CARDIAC CATHETERIZATION N/A 01/26/2015   Procedure: Right/Left Heart Cath and Coronary Angiography;  Surgeon: Lennette Bihari, MD;  Location: [facility] INVASIVE CV LAB;  Service: Cardiovascular;  Laterality: N/A;   CARDIAC  CATHETERIZATION N/A 01/27/2015   Procedure: Intravascular Pressure Wire/FFR Study;  Surgeon: Kathleene Hazel, MD;  Location: [facility] INVASIVE CV LAB;  Service: Cardiovascular;  Laterality: N/A;   CARDIAC CATHETERIZATION N/A 01/27/2015   Procedure: Right Heart Cath;  Surgeon: Kathleene Hazel, MD;  Location: [facility] INVASIVE CV LAB;  Service: Cardiovascular;  Laterality: N/A;   CHOLECYSTECTOMY     COLONOSCOPY WITH PROPOFOL N/A 03/07/2017   Procedure: COLONOSCOPY WITH PROPOFOL;  Surgeon: Charna Elizabeth, MD;  Location: [facility];  Service: Endoscopy;  Laterality: N/A;   CORONARY PRESSURE/FFR STUDY N/A 04/05/2017   Procedure: Intravascular Pressure Wire/FFR Study;  Surgeon: Swaziland, Peter M, MD;  Location: [facility] INVASIVE CV LAB;  Service: Cardiovascular;  Laterality: N/A;   CORONARY STENT PLACEMENT     LAD x 1   ESOPHAGOGASTRODUODENOSCOPY (EGD) WITH PROPOFOL N/A 12/01/2021   Procedure: ESOPHAGOGASTRODUODENOSCOPY (EGD) WITH PROPOFOL;  Surgeon: Jeani Hawking, MD;  Location:  [facility];  Service: Gastroenterology;  Laterality: N/A;   ganglion cyst removal  yrs ago   x 2   ILIAC VEIN ANGIOPLASTY / STENTING  02/15/2015   IVC FILTER INSERTION  2016   IVC FILTER REMOVAL  02/15/2015   at [facility]   LEFT HEART CATH AND CORONARY ANGIOGRAPHY N/A 04/05/2017   Procedure: Left Heart Cath and Coronary Angiography;  Surgeon: Swaziland, Peter M, MD;  Location: [facility] INVASIVE CV LAB;  Service: Cardiovascular;  Laterality: N/A;   LEFT HEART CATHETERIZATION WITH CORONARY ANGIOGRAM N/A 09/25/2011   Procedure: LEFT HEART CATHETERIZATION WITH CORONARY ANGIOGRAM;  Surgeon: Thurmon Fair, MD;  Location: [facility] CATH LAB;  Service: Cardiovascular;  Laterality: N/A;   multiple bladder surgeries to remove mesh     ROTATOR CUFF REPAIR Right    stent to groin Left 08/2014   left leg   TENDON REPAIR Right 07/10/2017   Procedure: RIGHT PERONEAL TENDON REPAIR;  Surgeon: Park Liter, DPM;  Location: [facility] OR;  Service: Podiatry;  Laterality:  Right;   TENDON REPAIR Right 05/27/2019   Procedure: PERONEAL TENDON REPAIR x2;  Surgeon: Park Liter, DPM;  Location: [facility];  Service: Podiatry;  Laterality: Right;   Social History:   reports that she has never smoked. She has never used smokeless tobacco. She reports that she does not drink alcohol and does not use drugs.  Family History  Problem Relation Age of Onset   Cancer Mother    Heart disease Father    Heart disease Sister    High blood pressure Sister    Heart disease Sister    Cancer Sister    Heart disease Brother    Diabetes Brother    Brain cancer Brother    High blood pressure Brother    Heart disease Brother    Heart attack Brother    Heart disease Brother    High blood pressure Brother     Medications: Patient's Medications  New Prescriptions   No medications on file  Previous Medications   CHOLECALCIFEROL (VITAMIN D3) 25 MCG (1000 UNIT) TABLET    Take 1,000 Units by mouth daily.    CYANOCOBALAMIN (VITAMIN B-12 PO)    Take 1 tablet by mouth daily.   EZETIMIBE (ZETIA) 10 MG TABLET    Take 1 tablet (10 mg total) by mouth every morning.   ISOSORBIDE MONONITRATE (IMDUR) 30 MG 24 HR TABLET    Take 1 tablet (30 mg total) by mouth daily.   KETOCONAZOLE (NIZORAL) 2 % SHAMPOO    Apply 1 application  topically daily as needed for irritation (or itching- SHAMPOO).   LATANOPROST (XALATAN) 0.005 % OPHTHALMIC SOLUTION    Place 1 drop into both eyes at bedtime.   LEVOTHYROXINE (SYNTHROID) 75 MCG TABLET    Take 1 tablet (75 mcg total) by mouth daily before breakfast.   NYSTATIN CREAM (MYCOSTATIN)    APPLY 1 APPLICATION TOPICALLY TWICE DAILY *REFILL REQUEST*   OVER THE COUNTER MEDICATION    Take 1 capsule by mouth daily. OmegaXL joint and muscle support   POTASSIUM CHLORIDE SA (KLOR-CON M) 20 MEQ TABLET    TAKE ONE TABLET BY MOUTH TWICE DAILY   PRAVASTATIN (PRAVACHOL) 80 MG TABLET    Take 80 mg by mouth at bedtime.   SERTRALINE (ZOLOFT) 25 MG TABLET    TAKE ONE TABLET  BY MOUTH ONCE DAILY   TORSEMIDE (DEMADEX) 100 MG TABLET    Take 0.5 tablets (50 mg total) by mouth daily.  TYLENOL 8 HOUR ARTHRITIS PAIN 650 MG CR TABLET    Take 650 mg by mouth every 8 (eight) hours as needed for pain.   VITAMIN C (ASCORBIC ACID) 500 MG TABLET    Take 500 mg by mouth daily.  Modified Medications   No medications on file  Discontinued Medications   No medications on file    Physical Exam:  Vitals:   06/25/23 1111  BP: (!) 150/64  Pulse: 62  Resp: 17  Temp: (!) 96.8 F (36 C)  SpO2: 96%  Weight: 177 lb 12.8 oz (80.6 kg)  Height: 5\' 5"  (1.651 m)   Body mass index is 29.59 kg/m. Wt Readings from Last 3 Encounters:  06/25/23 177 lb 12.8 oz (80.6 kg)  06/04/23 177 lb 4.8 oz (80.4 kg)  05/28/23 177 lb 12.8 oz (80.6 kg)    Physical Exam Constitutional:      Appearance: Normal appearance.  HENT:     Head: Normocephalic and atraumatic.  Cardiovascular:     Rate and Rhythm: Normal rate and regular rhythm.     Heart sounds: No murmur heard. Pulmonary:     Effort: Pulmonary effort is normal. No respiratory distress.     Breath sounds: Normal breath sounds. No wheezing.  Abdominal:     General: Bowel sounds are normal. There is no distension.     Tenderness: There is no abdominal tenderness. There is no guarding or rebound.  Musculoskeletal:        General: No swelling.     Comments: Bil ankles - no erythema, no redness, no swelling Tenderness at the base of the lower leg  Rom intact  Pedal pulses intact both feet   Skin:    Findings: Bruising present.  Neurological:     Mental Status: She is alert. Mental status is at baseline.     Sensory: No sensory deficit.     Motor: No weakness.      Labs reviewed: Basic Metabolic Panel: Recent Labs    09/04/22 0938 11/12/22 1915 11/15/22 0453 12/11/22 1459 02/04/23 1156 03/04/23 1448 04/23/23 1125 05/14/23 1102 06/04/23 1027  NA 142   < > 137   < > 141  --   --  141 142  K 3.4*   < > 3.8   < > 3.8   --   --  3.9 3.3*  CL 101   < > 100   < > 98  --   --  102 103  CO2 33*   < > 30   < > 30*  --   --  30* 33*  GLUCOSE 101*   < > 98   < > 97  --   --  83 116*  BUN 18   < > 15   < > 12  --   --  19 19  CREATININE 0.82   < > 0.74   < > 0.74  --   --  0.80 0.85  CALCIUM 9.5   < > 8.5*   < > 9.8  --   --  9.5 9.2  MG  --   --  2.0  --   --   --   --   --   --   TSH 0.08*  --   --   --   --  0.20* 0.61  --   --    < > = values in this interval not displayed.   Liver Function Tests: Recent Labs  11/12/22 1915 11/13/22 0624 05/28/23 1029 06/04/23 1027  AST 40 30 29 32  ALT 17 16 19 20   ALKPHOS 79 71  --  119  BILITOT 1.2 1.2 1.2 1.3*  PROT 5.6* 5.7* 6.1 6.2*  ALBUMIN 2.9* 2.8*  --  3.4*   No results for input(s): "LIPASE", "AMYLASE" in the last 8760 hours. No results for input(s): "AMMONIA" in the last 8760 hours. CBC: Recent Labs    02/08/23 1449 05/28/23 1029 06/04/23 1027  WBC 5.3 5.4 4.8  NEUTROABS 2,995 3,667 3.2  HGB 14.2 14.7 14.4  HCT 42.3 43.9 43.6  MCV 97.2 99.5 101.6*  PLT 110* 94* 75*   Lipid Panel: Recent Labs    09/04/22 0938 02/04/23 1156  CHOL 162 125  HDL 52 64  LDLCALC 87 44  TRIG 131 90  CHOLHDL 3.1 2.0   TSH: Recent Labs    09/04/22 0938 03/04/23 1448 04/23/23 1125  TSH 0.08* 0.20* 0.61   A1C: Lab Results  Component Value Date   HGBA1C 6.1 (H) 01/26/2015     Assessment/Plan   1. Chronic pain of both ankles  Instructed to take tylenol 650 mg q6 prn  Take gabapentin 300 mg ta bedtime - DG Ankle Complete Left; Future - DG Ankle Complete Right; Future - gabapentin (NEURONTIN) 300 MG capsule; Take 1 capsule (300 mg total) by mouth at bedtime.  Dispense: 90 capsule; Refill: 1  2. Essential hypertension  Repeat bp improved 130/64  3. Thrombocytopenia (HCC)  Monitor for signs of bleeding   No follow-ups on file.: follow up with PCP

## 2023-06-28 ENCOUNTER — Encounter: Payer: Self-pay | Admitting: Family

## 2023-06-28 DIAGNOSIS — R921 Mammographic calcification found on diagnostic imaging of breast: Secondary | ICD-10-CM | POA: Diagnosis not present

## 2023-06-28 LAB — HM MAMMOGRAPHY

## 2023-06-30 ENCOUNTER — Emergency Department (HOSPITAL_COMMUNITY)
Admission: EM | Admit: 2023-06-30 | Discharge: 2023-06-30 | Disposition: A | Discharge status: Home / Self Care | Payer: PPO | Attending: Emergency Medicine | Admitting: Emergency Medicine

## 2023-06-30 ENCOUNTER — Encounter (HOSPITAL_COMMUNITY): Payer: Self-pay

## 2023-06-30 ENCOUNTER — Emergency Department (HOSPITAL_COMMUNITY): Payer: PPO

## 2023-06-30 ENCOUNTER — Other Ambulatory Visit: Payer: Self-pay

## 2023-06-30 DIAGNOSIS — I10 Essential (primary) hypertension: Secondary | ICD-10-CM | POA: Insufficient documentation

## 2023-06-30 DIAGNOSIS — I160 Hypertensive urgency: Secondary | ICD-10-CM | POA: Insufficient documentation

## 2023-06-30 DIAGNOSIS — Z91040 Latex allergy status: Secondary | ICD-10-CM | POA: Diagnosis not present

## 2023-06-30 DIAGNOSIS — M6250 Muscle wasting and atrophy, not elsewhere classified, unspecified site: Secondary | ICD-10-CM | POA: Insufficient documentation

## 2023-06-30 DIAGNOSIS — R519 Headache, unspecified: Secondary | ICD-10-CM

## 2023-06-30 DIAGNOSIS — Z79899 Other long term (current) drug therapy: Secondary | ICD-10-CM | POA: Diagnosis not present

## 2023-06-30 DIAGNOSIS — D329 Benign neoplasm of meninges, unspecified: Secondary | ICD-10-CM | POA: Diagnosis not present

## 2023-06-30 LAB — CBC WITH DIFFERENTIAL/PLATELET
Abs Immature Granulocytes: 0 10*3/uL (ref 0.00–0.07)
Basophils Absolute: 0 10*3/uL (ref 0.0–0.1)
Basophils Relative: 1 %
Eosinophils Absolute: 0.1 10*3/uL (ref 0.0–0.5)
Eosinophils Relative: 2 %
HCT: 45.1 % (ref 36.0–46.0)
Hemoglobin: 14.6 g/dL (ref 12.0–15.0)
Immature Granulocytes: 0 %
Lymphocytes Relative: 22 %
Lymphs Abs: 1 10*3/uL (ref 0.7–4.0)
MCH: 33.5 pg (ref 26.0–34.0)
MCHC: 32.4 g/dL (ref 30.0–36.0)
MCV: 103.4 fL — ABNORMAL HIGH (ref 80.0–100.0)
Monocytes Absolute: 0.3 10*3/uL (ref 0.1–1.0)
Monocytes Relative: 7 %
Neutro Abs: 3 10*3/uL (ref 1.7–7.7)
Neutrophils Relative %: 68 %
Platelets: 78 10*3/uL — ABNORMAL LOW (ref 150–400)
RBC: 4.36 MIL/uL (ref 3.87–5.11)
RDW: 13.7 % (ref 11.5–15.5)
WBC: 4.4 10*3/uL (ref 4.0–10.5)
nRBC: 0 % (ref 0.0–0.2)

## 2023-06-30 LAB — COMPREHENSIVE METABOLIC PANEL
ALT: 23 U/L (ref 0–44)
AST: 42 U/L — ABNORMAL HIGH (ref 15–41)
Albumin: 3.2 g/dL — ABNORMAL LOW (ref 3.5–5.0)
Alkaline Phosphatase: 91 U/L (ref 38–126)
Anion gap: 11 (ref 5–15)
BUN: 17 mg/dL (ref 8–23)
CO2: 29 mmol/L (ref 22–32)
Calcium: 9.3 mg/dL (ref 8.9–10.3)
Chloride: 100 mmol/L (ref 98–111)
Creatinine, Ser: 0.79 mg/dL (ref 0.44–1.00)
GFR, Estimated: >60 mL/min (ref >60)
Glucose, Bld: 88 mg/dL (ref 70–99)
Potassium: 4.7 mmol/L (ref 3.5–5.1)
Sodium: 140 mmol/L (ref 135–145)
Total Bilirubin: 1.3 mg/dL — ABNORMAL HIGH (ref 0.3–1.2)
Total Protein: 5.7 g/dL — ABNORMAL LOW (ref 6.5–8.1)

## 2023-06-30 LAB — BRAIN NATRIURETIC PEPTIDE: B Natriuretic Peptide: 349.7 pg/mL — ABNORMAL HIGH (ref 0.0–100.0)

## 2023-06-30 MED ORDER — METOCLOPRAMIDE HCL 5 MG/ML IJ SOLN
10.0000 mg | Freq: Once | INTRAMUSCULAR | Status: AC
  Administered 2023-06-30: 10 mg via INTRAVENOUS
  Filled 2023-06-30: qty 2

## 2023-06-30 MED ORDER — GADOBUTROL 1 MMOL/ML IV SOLN
7.0000 mL | Freq: Once | INTRAVENOUS | Status: AC | PRN
  Administered 2023-06-30: 7 mL via INTRAVENOUS

## 2023-06-30 NOTE — ED Triage Notes (Signed)
Pt arrived POV from home c/o a fall backwards back in March 2024. Pt states it left a gash and she spent some time here but since then she has had headaches but had gradually gotten worse recently. Pt's BP is extremely elevated.

## 2023-06-30 NOTE — Discharge Instructions (Addendum)
As discussed and is very important follow-up with your physician tomorrow via telephone and in person in the next few days.  Be sure to discuss today's emergency department evaluation, and your ongoing headaches.  Notably, your initial blood pressure in the emergency department was greater than 200/100.  You should discuss appropriate adjustments to your medication regimen.  Return here for concerning changes in your condition.

## 2023-06-30 NOTE — ED Provider Notes (Signed)
[place] EMERGENCY DEPARTMENT AT [facility] Provider Note   CSN: [id number] Arrival date & time: 06/30/23  1311     History  Chief Complaint  Patient presents with   Headache    Jill Preston is a 89 y.o. female.  HPI Patient presents with family members to assist with history.  Patient has history of fall earlier this year.  Since that time she has had some headaches, but none consistently.  Now over the past 3 days she developed severe right parietal and occipital headache.  There is been new occasional blurry vision.  No obvious precipitant, additional fall.  Today with worsening headache while at church, she became concerned, and now presents for evaluation.  No extremity weakness, no discoordination, no speech difficulty, no confusion. Patient is coming by her grandson and her husband both of whom help with the history.    Home Medications Prior to Admission medications   Medication Sig Start Date End Date Taking? Authorizing Provider  ezetimibe (ZETIA) 10 MG tablet Take 1 tablet (10 mg total) by mouth every morning. 01/10/23  Yes Croitoru, Mihai, MD  gabapentin (NEURONTIN) 300 MG capsule Take 1 capsule (300 mg total) by mouth at bedtime. 06/25/23  Yes Venita Sheffield, MD  isosorbide mononitrate (IMDUR) 30 MG 24 hr tablet Take 1 tablet (30 mg total) by mouth daily. 04/24/23  Yes Croitoru, Mihai, MD  latanoprost (XALATAN) 0.005 % ophthalmic solution Place 1 drop into both eyes at bedtime. 10/10/22  Yes [provider]  levothyroxine (SYNTHROID) 75 MCG tablet Take 1 tablet (75 mcg total) by mouth daily before breakfast. 06/21/23  Yes Croitoru, Mihai, MD  nystatin cream (MYCOSTATIN) APPLY 1 APPLICATION TOPICALLY TWICE DAILY *REFILL REQUEST* Patient taking differently: 1 Application 2 (two) times daily as needed (itch). 05/21/23  Yes [name], [name], NP  OVER THE COUNTER MEDICATION Take 1 capsule by mouth daily. OmegaXL joint and muscle support   Yes  [provider]  potassium chloride SA (KLOR-CON M) 20 MEQ tablet TAKE ONE TABLET BY MOUTH TWICE DAILY 02/01/23  Yes Croitoru, Mihai, MD  pravastatin (PRAVACHOL) 80 MG tablet Take 80 mg by mouth at bedtime.   Yes [provider]  sertraline (ZOLOFT) 25 MG tablet TAKE ONE TABLET BY MOUTH ONCE DAILY 04/09/23  Yes [name], [name], NP  torsemide (DEMADEX) 100 MG tablet Take 0.5 tablets (50 mg total) by mouth daily. 04/24/23  Yes Croitoru, Mihai, MD  TYLENOL 8 HOUR ARTHRITIS PAIN 650 MG CR tablet Take 650 mg by mouth every 8 (eight) hours as needed for pain.   Yes [provider]      Allergies    Atorvastatin, Penicillins, Shellfish allergy, Aminophylline, Iodinated contrast media, Latex, Oxybutynin chloride, Propoxyphene, Vancomycin, Adhesive [tape], Betadine [povidone iodine], Ciprofloxacin, Codeine, and Ranolazine    Review of Systems   Review of Systems  Physical Exam Updated Vital Signs BP (!) 169/66   Pulse (!) 56   Temp 98.3 F (36.8 C) (Oral)   Resp 19   Ht 5\' 5"  (1.651 m)   Wt 79.4 kg   SpO2 (!) 85%   BMI 29.12 kg/m  Physical Exam Vitals and nursing note reviewed.  Constitutional:      General: She is not in acute distress.    Appearance: She is well-developed.  HENT:     Head: Normocephalic and atraumatic.  Eyes:     Conjunctiva/sclera: Conjunctivae normal.  Cardiovascular:     Rate and Rhythm: Normal rate and regular rhythm.  Pulmonary:     Effort: Pulmonary effort is normal. No respiratory distress.     Breath sounds: Normal breath sounds. No stridor.  Abdominal:     General: There is no distension.  Skin:    General: Skin is warm and dry.  Neurological:     Mental Status: She is alert and oriented to person, place, and time.     Cranial Nerves: No cranial nerve deficit, dysarthria or facial asymmetry.     Motor: Atrophy present. No weakness, tremor or abnormal muscle tone.  Psychiatric:        Mood and Affect: Mood normal.     ED  Results / Procedures / Treatments   Labs (all labs ordered are listed, but only abnormal results are displayed) Labs Reviewed  BRAIN NATRIURETIC PEPTIDE - Abnormal; Notable for the following components:      Result Value   B Natriuretic Peptide 349.7 (*)    All other components within normal limits  COMPREHENSIVE METABOLIC PANEL - Abnormal; Notable for the following components:   Total Protein 5.7 (*)    Albumin 3.2 (*)    AST 42 (*)    Total Bilirubin 1.3 (*)    All other components within normal limits  CBC WITH DIFFERENTIAL/PLATELET - Abnormal; Notable for the following components:   MCV 103.4 (*)    Platelets 78 (*)    All other components within normal limits  URINALYSIS, ROUTINE W REFLEX MICROSCOPIC    EKG None  Radiology MR Brain W and Wo Contrast  Result Date: 06/30/2023 CLINICAL DATA:  Headache, new onset (Age >= 51y) abnl CT, new HA. EXAM: MRI HEAD WITHOUT AND WITH CONTRAST TECHNIQUE: Multiplanar, multiecho pulse sequences of the brain and surrounding structures were obtained without and with intravenous contrast. CONTRAST:  7mL GADAVIST GADOBUTROL 1 MMOL/ML IV SOLN COMPARISON:  Head CT 06/30/2023. FINDINGS: Brain: No acute infarct or hemorrhage. Mild chronic small-vessel disease for age. Hydrocephalus or extra-axial collection. 8 mm enhancing, dural-based mass along the left aspect of the falx (axial image 39 series 10), most consistent with a meningioma. No significant mass effect or midline shift. No foci of abnormal susceptibility. Vascular: Normal flow voids and vessel enhancement. Skull and upper cervical spine: Normal marrow signal and enhancement. Sinuses/Orbits: No acute findings. Other: None. IMPRESSION: 1. No acute intracranial process. 2. Small left parafalcine meningioma, measuring up to 8 mm. No significant mass effect. Electronically Signed   By: Orvan Falconer M.D.   On: 06/30/2023 18:12   CT Head Wo Contrast  Result Date: 06/30/2023 CLINICAL DATA:   Headache, new onset. Right posterior parietal headache. EXAM: CT HEAD WITHOUT CONTRAST TECHNIQUE: Contiguous axial images were obtained from the base of the skull through the vertex without intravenous contrast. RADIATION DOSE REDUCTION: This exam was performed according to the departmental dose-optimization program which includes automated exposure control, adjustment of the mA and/or kV according to patient size and/or use of iterative reconstruction technique. COMPARISON:  Head CT 12/05/2022. FINDINGS: Brain: No acute hemorrhage. Unchanged mild chronic small-vessel disease. Cortical gray-white differentiation is otherwise preserved. Prominence of the ventricles and sulci within expected range for age. No hydrocephalus or extra-axial collection. 8 mm hyperdense mass along the left aspect of the falx (axial image 25 series 3), likely meningioma. No mass effect or midline shift. Vascular: No hyperdense vessel or unexpected calcification. Skull: Normal. Negative for fracture or focal lesion. Sinuses/Orbits: No acute finding. Other: None. IMPRESSION: 1. No acute intracranial abnormality. 2. 8 mm hyperdense mass along the left  aspect of the falx, likely meningioma. This could be confirmed with contrast-enhanced MRI. Electronically Signed   By: Orvan Falconer M.D.   On: 06/30/2023 16:04    Procedures Procedures    Medications Ordered in ED Medications  metoCLOPramide (REGLAN) injection 10 mg (10 mg Intravenous Given 06/30/23 1508)  gadobutrol (GADAVIST) 1 MMOL/ML injection 7 mL (7 mLs Intravenous Contrast Given 06/30/23 1740)    ED Course/ Medical Decision Making/ A&P                                 Medical Decision Making Elderly female with trauma in the distant past presents with new headache.  Patient is awake, alert, neurologically unremarkable, but given her headache, with prior trauma, suspicion for this contributing her headache versus spontaneous bleed, given patient's elevated hypertension.   Other considerations include hypertensive urgency.  Patient is unaware of what her medications are, received antihypertensives, continuous monitoring after my evaluation. Cardiac 60 sinus normal Pulse ox 95% borderline   Amount and/or Complexity of Data Reviewed Independent Historian: spouse External Data Reviewed: notes. Labs: ordered. Decision-making details documented in ED Course. Radiology: ordered and independent interpretation performed. Decision-making details documented in ED Course. ECG/medicine tests: ordered and independent interpretation performed. Decision-making details documented in ED Course.  Risk Prescription drug management. Decision regarding hospitalization. Diagnosis or treatment significantly limited by social determinants of health.   Update: Patient feeling better, blood pressure now less than 170 systolic after meds.  CT reviewed, abnormal, MRI ordered.  6:34 PM Patient continues to feel better, now coming by daughter-in-law, and her husband.  MRI reviewed, no notable findings.  Suspicion for either posttraumatic headache which she has had since her falls several months ago versus exacerbation secondary to hypertensive urgency.  No evidence for intracranial abnormality, no ongoing chest pain or other complaints, no evidence for endorgan damage, patient comfortable with following up with her physician tomorrow via telephone for close outpatient follow-up.        Final Clinical Impression(s) / ED Diagnoses Final diagnoses:  Bad headache  Hypertensive urgency    Rx / DC Orders ED Discharge Orders     None         Gerhard Munch, MD 06/30/23 [phone]

## 2023-06-30 NOTE — ED Notes (Signed)
Pt transported to MRI 

## 2023-07-01 ENCOUNTER — Telehealth: Payer: Self-pay | Admitting: Cardiovascular Disease

## 2023-07-01 NOTE — Telephone Encounter (Signed)
Spoke with patient and she states she went to ED because her SBP was over 200 and she would like for you to review the notes and provide any recommendations if you have any.  She is unable to check her BP currently. She states she does feel better.

## 2023-07-01 NOTE — Telephone Encounter (Signed)
  The patient mentioned that she visited the emergency department yesterday due to her blood pressure being in the 200s. She underwent a brain scan to check for any bleeding and reported that all results were normal. She kindly requests that Dr. Salena Saner review the results and provide any recommendations he may have

## 2023-07-03 NOTE — Telephone Encounter (Signed)
Appt scheduled for 10/24 with Joni Reining, NP. Patient was still taking Spironolactone and Amlodipine. Advised patient she was suppose to stop those medications after she was discharged from the hospital in March. Patient will stop those medications.

## 2023-07-08 NOTE — Progress Notes (Unsigned)
Cardiology Office Note:  .   Date:  07/11/2023  ID:  Jill Preston, DOB 1933-03-06, MRN [id number] PCP: [name], NP  [facility] Providers Cardiologist:  Dr.Croitoru  }   History of Present Illness: .   Jill Preston is a 89 y.o. female history of CAD with moderate stenosis of the left main coronary artery nonhemodynamically significant by FFR analysis in 2018, chronic diastolic heart failure, last seen by Dr. [name] on 04/24/2023.  No changes in medications or plan testing at this time.  It was noted that the patient could not tolerate metolazone as this caused significant chronic kidney disease worsening.  She presents today with complaints of worsening hypertension.   Seen in the ED on 06/30/2023 for headache and elevated blood pressure.  This patient has a history of prior concussion in March 2024 and continues to have intermittent headaches from that time.  She was ruled out for CVA or any intracranial abnormality.  She continues to have significant amount of life stressor and talks at length about this.  They do have a home health aide that is there during the day that assist both the patient and her husband throughout the day and make sure medications are taken.  She has had no further complaints of headaches and blood pressures been better controlled since ED visit.   ROS: Denies chest pain, dizziness, dyspnea on exertion, but does have generalized fatigue.  Studies Reviewed: Marland Kitchen       CATH 04/05/2017   LM lesion, 70 %stenosed. Mid LAD lesion, 0 %stenosed at site of prior stent. Ost Cx lesion, 60 %stenosed. Prox RCA-1 lesion, 20 %stenosed. Prox RCA-2 lesion, 30 %stenosed. The left ventricular systolic function is normal. LV end diastolic pressure is normal. The left ventricular ejection fraction is 55-65% by visual estimate.   1. 70% eccentric distal left main stenosis. Angiographically similar to 2016. FFR 0.89 into the LAD and 0.86 into the LCx  suggesting that this lesion is not flow limiting.  2. Otherwise nonobstructive CAD. Patent stent in LAD. 3. Normal LV function 4. Normal LVEDP      Physical Exam:   VS:  BP 120/60 (BP Location: Left Arm, Patient Position: Sitting, Cuff Size: Large)   Pulse 63   Ht 5' 5.5" (1.664 m)   Wt 178 lb (80.7 kg)   SpO2 96%   BMI 29.17 kg/m    Wt Readings from Last 3 Encounters:  07/11/23 178 lb (80.7 kg)  06/30/23 175 lb (79.4 kg)  06/25/23 177 lb 12.8 oz (80.6 kg)    GEN: Well nourished, well developed in no acute distress NECK: No JVD; No carotid bruits CARDIAC: RRR, no murmurs, rubs, gallops RESPIRATORY:  Clear to auscultation without rales, wheezing or rhonchi  ABDOMEN: Soft, non-tender, non-distended EXTREMITIES:  No edema; No deformity   ASSESSMENT AND PLAN: .    Hypertension: She is having labile blood pressures with associated headaches when elevated.  She has been seen recently in the ED with elevated blood pressure and headache.  She is medically compliant and does have a home health aide helping her to make sure the medications are taken correctly.  Today blood pressure is very well-controlled.  Will not make any medication changes at this time.  She is to keep up with her blood pressure and write it down after taking medications in the morning.  On follow-up she should bring these results.  She is to call for any new symptoms.  2.  Significant life stressors: Talks at length about her home life with her husband with dementia and her chronic headaches.  She states that she is going to talk to her primary care physician about an antianxiety medication as this has been helpful to her husband when he becomes irritable.  I have encouraged her to follow-up with her PCP and make this discussion a priority.  3.  Hypercholesterolemia: Remains on pravastatin 80 mg at bedtime.  Labs are followed by PCP.    4.  Deconditioning: Uses a walker for ambulation.  Is not very active  currently.  5.  Chronic diastolic CHF: Remains on torsemide 25 mg daily.  No evidence of volume overload.  Continue low-sodium diet.      Signed, Bettey Mare. Liborio Nixon, ANP, AACC

## 2023-07-09 DIAGNOSIS — I10 Essential (primary) hypertension: Secondary | ICD-10-CM | POA: Diagnosis not present

## 2023-07-10 ENCOUNTER — Other Ambulatory Visit: Payer: Self-pay | Admitting: Radiology

## 2023-07-10 DIAGNOSIS — D241 Benign neoplasm of right breast: Secondary | ICD-10-CM | POA: Diagnosis not present

## 2023-07-10 DIAGNOSIS — N6489 Other specified disorders of breast: Secondary | ICD-10-CM | POA: Diagnosis not present

## 2023-07-10 DIAGNOSIS — N6315 Unspecified lump in the right breast, overlapping quadrants: Secondary | ICD-10-CM | POA: Diagnosis not present

## 2023-07-10 LAB — HM MAMMOGRAPHY

## 2023-07-11 ENCOUNTER — Encounter: Payer: Self-pay | Admitting: Adult Health

## 2023-07-11 ENCOUNTER — Telehealth: Payer: Self-pay

## 2023-07-11 ENCOUNTER — Ambulatory Visit: Payer: PPO | Attending: Adult Health | Admitting: Adult Health

## 2023-07-11 VITALS — BP 120/60 | HR 63 | Ht 65.5 in | Wt 178.0 lb

## 2023-07-11 DIAGNOSIS — I2510 Atherosclerotic heart disease of native coronary artery without angina pectoris: Secondary | ICD-10-CM

## 2023-07-11 DIAGNOSIS — E7800 Pure hypercholesterolemia, unspecified: Secondary | ICD-10-CM

## 2023-07-11 DIAGNOSIS — N1831 Chronic kidney disease, stage 3a: Secondary | ICD-10-CM

## 2023-07-11 DIAGNOSIS — I495 Sick sinus syndrome: Secondary | ICD-10-CM | POA: Diagnosis not present

## 2023-07-11 DIAGNOSIS — I10 Essential (primary) hypertension: Secondary | ICD-10-CM

## 2023-07-11 LAB — SURGICAL PATHOLOGY

## 2023-07-11 NOTE — Telephone Encounter (Signed)
Patient states she went to the cardiologist today and was told to call her PCP because she has been stressed. Patient states she is taking care of her husband and does not want to keep getting stressed and worked up. She states she was given a medication before but does not recall the name. I attempted to schedule a appointment but she declined.

## 2023-07-11 NOTE — Telephone Encounter (Signed)
Called and patient states she will wait until appointment on Monday to discuss

## 2023-07-11 NOTE — Patient Instructions (Signed)
Medication Instructions:  No Changes *If you need a refill on your cardiac medications before your next appointment, please call your pharmacy*   Lab Work: No Labs If you have labs (blood work) drawn today and your tests are completely normal, you will receive your results only by: MyChart Message (if you have MyChart) OR A paper copy in the mail If you have any lab test that is abnormal or we need to change your treatment, we will call you to review the results.   Testing/Procedures: No Testing   Follow-Up: At [facility], you and your health needs are our priority.  As part of our continuing mission to provide you with exceptional heart care, we have created designated Provider Care Teams.  These Care Teams include your primary Cardiologist (physician) and Advanced Practice Providers (APPs -  Physician Assistants and Nurse Practitioners) who all work together to provide you with the care you need, when you need it.  We recommend signing up for the patient portal called "MyChart".  Sign up information is provided on this After Visit Summary.  MyChart is used to connect with patients for Virtual Visits (Telemedicine).  Patients are able to view lab/test results, encounter notes, upcoming appointments, etc.  Non-urgent messages can be sent to your provider as well.   To learn more about what you can do with MyChart, go to [email or web address].    Your next appointment:   6 month(s)  Provider:   Mihai Croitoru, MD   

## 2023-07-11 NOTE — Telephone Encounter (Signed)
May schedule appointment to evaluate.

## 2023-07-15 ENCOUNTER — Encounter: Payer: PPO | Admitting: Family

## 2023-07-15 ENCOUNTER — Encounter: Payer: Self-pay | Admitting: Family

## 2023-07-15 NOTE — Progress Notes (Unsigned)
  This service is provided via telemedicine  No vital signs collected/recorded due to the encounter was a telemedicine visit.   Location of patient (ex: home, work):  Home  Patient consents to a telephone visit:    Location of the provider (ex: office, home):  Office  Name of any referring provider:  Kyriaki Moder, [name], NP   Names of all persons participating in the telemedicine service and their role in the encounter:  Ethelean Bianca (patient); Porsha McClurkin,CMA; Mikisha Roseland C, NP   Time spent on call:  10 minutes

## 2023-07-17 ENCOUNTER — Encounter: Payer: Self-pay | Admitting: Adult Health

## 2023-07-17 ENCOUNTER — Ambulatory Visit (INDEPENDENT_AMBULATORY_CARE_PROVIDER_SITE_OTHER): Payer: HMO | Admitting: Adult Health

## 2023-07-17 VITALS — BP 138/76 | HR 60 | Resp 20 | Ht 65.5 in | Wt 183.4 lb

## 2023-07-17 DIAGNOSIS — Z0000 Encounter for general adult medical examination without abnormal findings: Secondary | ICD-10-CM | POA: Diagnosis not present

## 2023-07-17 NOTE — Progress Notes (Signed)
Subjective:   Jill Preston is a 89 y.o. female who presents for Medicare Annual (Subsequent) preventive examination.  Visit Complete: In person  Patient Medicare AWV questionnaire was completed by the patient on 07/17/23; I have confirmed that all information answered by patient is correct and no changes since this date.  Cardiac Risk Factors include: advanced age (>27men, >72 women);hypertension     Objective:    Today's Vitals   07/17/23 0940 07/17/23 0944 07/17/23 1014  BP: (!) 144/74 138/76   Pulse: 60    Resp: 20    SpO2: 96%    Weight: 183 lb 6.4 oz (83.2 kg)    Height: 5' 5.5" (1.664 m)    PainSc:  0-No pain 0-No pain   Body mass index is 30.06 kg/m.     07/17/2023    9:47 AM 07/15/2023    1:06 PM 06/25/2023   11:18 AM 05/28/2023   10:07 AM 02/08/2023    2:23 PM 02/08/2023   11:09 AM 11/13/2022    1:26 AM  Advanced Directives  Does Patient Have a Medical Advance Directive? Yes Yes Yes Yes Yes Yes   Type of Advance Directive Out of facility DNR (pink MOST or yellow form) Out of facility DNR (pink MOST or yellow form) Out of facility DNR (pink MOST or yellow form) Out of facility DNR (pink MOST or yellow form) Out of facility DNR (pink MOST or yellow form) Out of facility DNR (pink MOST or yellow form)   Does patient want to make changes to medical advance directive? No - Patient declined No - Patient declined No - Patient declined No - Patient declined No - Patient declined No - Patient declined   Would patient like information on creating a medical advance directive?       No - Patient declined  Pre-existing out of facility DNR order (yellow form or pink MOST form) Yellow form placed in chart (order not valid for inpatient use) Pink MOST form placed in chart (order not valid for inpatient use)         Current Medications (verified) Outpatient Encounter Medications as of 07/17/2023  Medication Sig   ezetimibe (ZETIA) 10 MG tablet Take 1 tablet (10 mg total) by  mouth every morning.   gabapentin (NEURONTIN) 300 MG capsule Take 1 capsule (300 mg total) by mouth at bedtime.   isosorbide mononitrate (IMDUR) 30 MG 24 hr tablet Take 1 tablet (30 mg total) by mouth daily.   latanoprost (XALATAN) 0.005 % ophthalmic solution Place 1 drop into both eyes at bedtime.   levothyroxine (SYNTHROID) 75 MCG tablet Take 1 tablet (75 mcg total) by mouth daily before breakfast.   nystatin cream (MYCOSTATIN) APPLY 1 APPLICATION TOPICALLY TWICE DAILY *REFILL REQUEST* (Patient taking differently: 1 Application 2 (two) times daily as needed (itch).)   OVER THE COUNTER MEDICATION Take 1 capsule by mouth daily. OmegaXL joint and muscle support   potassium chloride SA (KLOR-CON M) 20 MEQ tablet TAKE ONE TABLET BY MOUTH TWICE DAILY   pravastatin (PRAVACHOL) 80 MG tablet Take 80 mg by mouth at bedtime.   sertraline (ZOLOFT) 25 MG tablet TAKE ONE TABLET BY MOUTH ONCE DAILY   torsemide (DEMADEX) 100 MG tablet Take 0.5 tablets (50 mg total) by mouth daily.   TYLENOL 8 HOUR ARTHRITIS PAIN 650 MG CR tablet Take 650 mg by mouth every 8 (eight) hours as needed for pain.   No facility-administered encounter medications on file as of 07/17/2023.    Allergies (  verified) Atorvastatin, Penicillins, Shellfish allergy, Aminophylline, Iodinated contrast media, Latex, Oxybutynin chloride, Propoxyphene, Vancomycin, Adhesive [tape], Betadine [povidone iodine], Ciprofloxacin, Codeine, and Ranolazine   History: Past Medical History:  Diagnosis Date   Arthritis    right knee   Cancer (HCC) yrs ago   skin cancer removed from face   Chronic diastolic CHF (congestive heart failure) (HCC)    Chronic venous insufficiency    LEA VENOUS, 10/17/2011 - mild reflux in bilateral common femoral veins   CKD (chronic kidney disease), stage III (HCC)    Coronary artery disease    a. s/p PCI/BMS to prox LAD and balloon angioplasty to mLAD with suboptimal result in 2000. b. Abnl nuc 2012, cath 09/2011 - showed  that the overall territory of potential ischemia was small and attributable to a moderately diseased small second diagonal artery. Med rx.   Dyspnea    with activity   Headache    History of blood transfusion 2017   Hyperlipidemia    Hypertension    Hypertensive heart disease    Hypothyroidism    Obesity    Pneumonia    several times   Stroke [facility])    pt. states she had "light stroke" in sept. 1980   Urinary incontinence    Past Surgical History:  Procedure Laterality Date   ABDOMINAL HYSTERECTOMY  1970's   complete   ANKLE ARTHROSCOPY Right 07/10/2017   Procedure: ANKLE ARTHROSCOPY;  Surgeon: Park Liter, DPM;  Location: [facility] OR;  Service: Podiatry;  Laterality: Right;   BACK SURGERY  07/26/2016   cervical neck surgery, [facility]   BALLOON DILATION N/A 12/01/2021   Procedure: BALLOON DILATION;  Surgeon: Jeani Hawking, MD;  Location: [facility] ENDOSCOPY;  Service: Gastroenterology;  Laterality: N/A;   BLADDER SURGERY  2010   WITH MESH  bladder tach   bunion removal surgery Bilateral 15 yrs ago   CARDIAC CATHETERIZATION Left 09/25/2011   Medical management   CARDIAC CATHETERIZATION Left 03/25/2001   Normal LV function, LAD residual narrowing of less than 10%, normal ramus intermediate, circumflex, and RCA,    CARDIAC CATHETERIZATION  09/04/1999   LAD, 3x40mm Tetra stent resulting in a reduction of the 80% stenosis to 0% residual   CARDIAC CATHETERIZATION N/A 01/26/2015   Procedure: Right/Left Heart Cath and Coronary Angiography;  Surgeon: Lennette Bihari, MD;  Location: [facility] INVASIVE CV LAB;  Service: Cardiovascular;  Laterality: N/A;   CARDIAC CATHETERIZATION N/A 01/27/2015   Procedure: Intravascular Pressure Wire/FFR Study;  Surgeon: Kathleene Hazel, MD;  Location: [facility] INVASIVE CV LAB;  Service: Cardiovascular;  Laterality: N/A;   CARDIAC CATHETERIZATION N/A 01/27/2015   Procedure: Right Heart Cath;  Surgeon: Kathleene Hazel, MD;  Location: [facility] INVASIVE CV LAB;   Service: Cardiovascular;  Laterality: N/A;   CHOLECYSTECTOMY     COLONOSCOPY WITH PROPOFOL N/A 03/07/2017   Procedure: COLONOSCOPY WITH PROPOFOL;  Surgeon: Charna Elizabeth, MD;  Location: [facility];  Service: Endoscopy;  Laterality: N/A;   CORONARY PRESSURE/FFR STUDY N/A 04/05/2017   Procedure: Intravascular Pressure Wire/FFR Study;  Surgeon: Swaziland, Peter M, MD;  Location: [facility] INVASIVE CV LAB;  Service: Cardiovascular;  Laterality: N/A;   CORONARY STENT PLACEMENT     LAD x 1   ESOPHAGOGASTRODUODENOSCOPY (EGD) WITH PROPOFOL N/A 12/01/2021   Procedure: ESOPHAGOGASTRODUODENOSCOPY (EGD) WITH PROPOFOL;  Surgeon: Jeani Hawking, MD;  Location: [facility];  Service: Gastroenterology;  Laterality: N/A;   ganglion cyst removal  yrs ago   x 2   ILIAC VEIN ANGIOPLASTY /  STENTING  02/15/2015   IVC FILTER INSERTION  2016   IVC FILTER REMOVAL  02/15/2015   at [facility]   LEFT HEART CATH AND CORONARY ANGIOGRAPHY N/A 04/05/2017   Procedure: Left Heart Cath and Coronary Angiography;  Surgeon: Swaziland, Peter M, MD;  Location: [facility] INVASIVE CV LAB;  Service: Cardiovascular;  Laterality: N/A;   LEFT HEART CATHETERIZATION WITH CORONARY ANGIOGRAM N/A 09/25/2011   Procedure: LEFT HEART CATHETERIZATION WITH CORONARY ANGIOGRAM;  Surgeon: Thurmon Fair, MD;  Location: [facility] CATH LAB;  Service: Cardiovascular;  Laterality: N/A;   multiple bladder surgeries to remove mesh     ROTATOR CUFF REPAIR Right    stent to groin Left 08/2014   left leg   TENDON REPAIR Right 07/10/2017   Procedure: RIGHT PERONEAL TENDON REPAIR;  Surgeon: Park Liter, DPM;  Location: [facility] OR;  Service: Podiatry;  Laterality: Right;   TENDON REPAIR Right 05/27/2019   Procedure: PERONEAL TENDON REPAIR x2;  Surgeon: Park Liter, DPM;  Location: [facility] ORS;  Service: Podiatry;  Laterality: Right;   Family History  Problem Relation Age of Onset   Cancer Mother    Heart disease Father    Heart disease Sister    High blood pressure Sister    Heart disease  Sister    Cancer Sister    Heart disease Brother    Diabetes Brother    Brain cancer Brother    High blood pressure Brother    Heart disease Brother    Heart attack Brother    Heart disease Brother    High blood pressure Brother    Social History   Socioeconomic History   Marital status: Married    Spouse name: Not on file   Number of children: Not on file   Years of education: Not on file   Highest education level: Not on file  Occupational History   Not on file  Tobacco Use   Smoking status: Never   Smokeless tobacco: Never  Vaping Use   Vaping status: Never Used  Substance and Sexual Activity   Alcohol use: No   Drug use: No   Sexual activity: Not on file  Other Topics Concern   Not on file  Social History Narrative   Per [facility] New Patient Packet Abstracted on 03/06/2021      Diet: Left blank       Caffeine: No      Married, if yes what year: Yes, 1955      Do you live in a house, apartment, assisted living, condo, trailer, ect: House      Is it one or more stories: 1 story      How many persons live in your home? 2      Pets: No      Highest level or education completed: 12 th grade      Current/Past profession: Research officer, political party       Exercise:          Yes       Type and how often: Walking          Living Will: Yes   DNR: No   POA/HPOA: Yes      Functional Status:   Do you have difficulty bathing or dressing yourself? Left Blank    Do you have difficulty preparing food or eating?Left Blank   Do you have difficulty managing your medications?Left Blank   Do you have difficulty managing your finances?Left Blank   Do you have  difficulty affording your medications?Left Blank   Social Determinants of Health   Financial Resource Strain: Not on file  Food Insecurity: Food Insecurity Present (11/13/2022)   Hunger Vital Sign    Worried About Running Out of Food in the Last Year: Sometimes true    Ran Out of Food in the Last Year: Sometimes true   Transportation Needs: No Transportation Needs (11/13/2022)   PRAPARE - Administrator, Civil Service (Medical): No    Lack of Transportation (Non-Medical): No  Physical Activity: Not on file  Stress: Not on file  Social Connections: Not on file    Tobacco Counseling Counseling given: Not Answered   Clinical Intake:  Pre-visit preparation completed: No  Pain : No/denies pain Pain Score: 0-No pain     BMI - recorded: 30.06 Nutritional Status: BMI 25 -29 Overweight Nutritional Risks: None Diabetes: No  How often do you need to have someone help you when you read instructions, pamphlets, or other written materials from your doctor or pharmacy?: 3 - Sometimes What is the last grade level you completed in school?: GED  Interpreter Needed?: No  Information entered by :: Ayush Boulet Medina-Vargas DNP   Activities of Daily Living    07/17/2023    9:46 AM 07/15/2023    1:04 PM  In your present state of health, do you have any difficulty performing the following activities:  Hearing? 0 0  Vision? 0 0  Difficulty concentrating or making decisions? 0 1  Walking or climbing stairs? 1 1  Dressing or bathing? 0 0  Doing errands, shopping? 0 0  Preparing Food and eating ? N N  Using the Toilet? N N  In the past six months, have you accidently leaked urine? N N  Do you have problems with loss of bowel control? N N  Managing your Medications? N N  Managing your Finances? N N  Housekeeping or managing your Housekeeping? N N    Patient Care Team: [name], [name], NP as PCP - General (Family Medicine) Croitoru, Rachelle Hora, MD as PCP - Cardiology (Cardiology) Croitoru, Rachelle Hora, MD as Consulting Physician (Cardiology) Cherlyn Roberts, MD as Referring Physician (Dermatology) Park Liter, DPM (Inactive) as Consulting Physician (Podiatry) [place], [name], OD (Optometry) Jerrell Mylar, MD as Referring Physician (Gynecology)  Indicate any recent Medical Services you  may have received from other than [facility] providers in the past year (date may be approximate).     Assessment:   This is a routine wellness examination for Jill Preston.  Hearing/Vision screen Hearing Screening - Comments:: No hearing concerns Vision Screening - Comments:: No vision concerns   Goals Addressed             This Visit's Progress    Exercise 3x per week (30 min per time)       -  will do stretching with a band while sitting down -  will walk everyday      Depression Screen    07/17/2023    9:48 AM 07/15/2023    1:08 PM 12/11/2022    1:28 PM 09/04/2022    8:51 AM 06/21/2022   10:18 AM 06/15/2021   11:02 AM 04/07/2020   11:24 AM  PHQ 2/9 Scores  PHQ - 2 Score 0 0 0 0 0 0 0  PHQ- 9 Score 0 0 0        Fall Risk    07/17/2023    9:47 AM 07/15/2023    1:08 PM 06/25/2023   11:17  AM 05/28/2023   10:07 AM 03/28/2023    1:36 PM  Fall Risk   Falls in the past year? 1 1 1 1 1   Number falls in past yr: 0 0 0 0 1  Injury with Fall? 0 1 1 1 1   Risk for fall due to : History of fall(s) History of fall(s) History of fall(s);Impaired balance/gait;Impaired mobility Impaired balance/gait;Impaired mobility History of fall(s);Impaired balance/gait  Follow up Falls evaluation completed Falls evaluation completed;Education provided Falls evaluation completed;Education provided;Falls prevention discussed Falls evaluation completed;Education provided;Falls prevention discussed Falls evaluation completed    MEDICARE RISK AT HOME:    TIMED UP AND GO:  Was the test performed?  No    Cognitive Function:    07/17/2023    9:48 AM  MMSE - Mini Mental State Exam  Orientation to time 5  Orientation to Place 5  Registration 3  Attention/ Calculation 5  Recall 2  Language- name 2 objects 2  Language- repeat 1  Language- follow 3 step command 3  Language- read & follow direction 1  Write a sentence 0  Copy design 0  Total score 27        07/15/2023    1:08 PM 06/21/2022    10:20 AM 06/15/2021   10:59 AM  6CIT Screen  What Year? 0 points 0 points 0 points  What month? 0 points 0 points 0 points  What time? 0 points 0 points 0 points  Count back from 20 0 points 0 points 0 points  Months in reverse 0 points 0 points 0 points  Repeat phrase 0 points 2 points 4 points  Total Score 0 points 2 points 4 points    Immunizations Immunization History  Administered Date(s) Administered   PFIZER(Purple Top)SARS-COV-2 Vaccination 11/11/2019, 11/13/2019, 12/08/2019   Pneumococcal Conjugate-13 06/14/2014   Pneumococcal Polysaccharide-23 10/26/2013, 03/30/2020   Tdap 12/19/2015, 11/12/2022    TDAP status: Up to date  Flu Vaccine status: Declined, Education has been provided regarding the importance of this vaccine but patient still declined. Advised may receive this vaccine at local pharmacy or Health Dept. Aware to provide a copy of the vaccination record if obtained from local pharmacy or Health Dept. Verbalized acceptance and understanding.  Pneumococcal vaccine status: Up to date  Covid-19 vaccine status: Information provided on how to obtain vaccines.   Qualifies for Shingles Vaccine? Yes   Zostavax completed No   Shingrix Completed?: No.    Education has been provided regarding the importance of this vaccine. Patient has been advised to call insurance company to determine out of pocket expense if they have not yet received this vaccine. Advised may also receive vaccine at local pharmacy or Health Dept. Verbalized acceptance and understanding.  Screening Tests Health Maintenance  Topic Date Due   Medicare Annual Wellness (AWV)  07/16/2024   DTaP/Tdap/Td (3 - Td or Tdap) 11/12/2032   Pneumonia Vaccine 63+ Years old  Completed   DEXA SCAN  Completed   HPV VACCINES  Aged Out   INFLUENZA VACCINE  Discontinued   COVID-19 Vaccine  Discontinued   Zoster Vaccines- Shingrix  Discontinued    Health Maintenance  There are no preventive care reminders to  display for this patient.   Colorectal cancer screening: Type of screening: Colonoscopy. Completed 03/07/17. Repeat every PRN years  Mammogram status: Completed 07/10/23. Repeat every year  Bone Density status: Completed 06/11/23. Results reflect: Bone density results: OSTEOPENIA. Repeat every NO years.  Lung Cancer Screening: (Low Dose CT Chest recommended  if Age 59-80 years, 20 pack-year currently smoking OR have quit w/in 15years.) does not qualify.   Lung Cancer Screening Referral: No  Additional Screening:  Hepatitis C Screening: does not qualify; Completed Not completed  Vision Screening: Recommended annual ophthalmology exams for early detection of glaucoma and other disorders of the eye. Is the patient up to date with their annual eye exam?  Yes  Who is the provider or what is the name of the office in which the patient attends annual eye exams? Dr. Dorthey Sawyer If pt is not established with a provider, would they like to be referred to a provider to establish care? No .   Dental Screening: Recommended annual dental exams for proper oral hygiene  Diabetic Foot Exam: Diabetic Foot Exam: Overdue, Pt has been advised about the importance in completing this exam. Pt is scheduled for diabetic foot exam on next visit.  Community Resource Referral / Chronic Care Management: CRR required this visit?  No   CCM required this visit?  No     Plan:     I have personally reviewed and noted the following in the patient's chart:   Medical and social history Use of alcohol, tobacco or illicit drugs  Current medications and supplements including opioid prescriptions. Patient is not currently taking opioid prescriptions. Functional ability and status Nutritional status Physical activity Advanced directives List of other physicians Hospitalizations, surgeries, and ER visits in previous 12 months Vitals Screenings to include cognitive, depression, and falls Referrals and  appointments  In addition, I have reviewed and discussed with patient certain preventive protocols, quality metrics, and best practice recommendations. A written personalized care plan for preventive services as well as general preventive health recommendations were provided to patient.     Jill Ekstein Medina-Vargas, NP   07/17/2023   After Visit Summary: (In Person-Printed) AVS printed and given to the patient  Nurse Notes: needs to be done annually.

## 2023-07-17 NOTE — Patient Instructions (Signed)
  Jill Preston , Thank you for taking time to come for your Medicare Wellness Visit. I appreciate your ongoing commitment to your health goals. Please review the following plan we discussed and let me know if I can assist you in the future.   These are the goals we discussed:  Goals      Exercise 3x per week (30 min per time)     -  will do stretching with a band while sitting down -  will walk everyday        This is a list of the screening recommended for you and due dates:  Health Maintenance  Topic Date Due   Medicare Annual Wellness Visit  07/16/2024   DTaP/Tdap/Td vaccine (3 - Td or Tdap) 11/12/2032   Pneumonia Vaccine  Completed   DEXA scan (bone density measurement)  Completed   HPV Vaccine  Aged Out   Flu Shot  Discontinued   COVID-19 Vaccine  Discontinued   Zoster (Shingles) Vaccine  Discontinued

## 2023-07-19 NOTE — Progress Notes (Signed)
-    left ankle imaging negative for fracture, showed degenerative changes.

## 2023-08-08 ENCOUNTER — Ambulatory Visit (INDEPENDENT_AMBULATORY_CARE_PROVIDER_SITE_OTHER): Payer: HMO | Admitting: Adult Health

## 2023-08-08 ENCOUNTER — Encounter: Payer: Self-pay | Admitting: Adult Health

## 2023-08-08 VITALS — BP 128/78 | HR 63 | Temp 97.7°F | Resp 18 | Ht 65.5 in | Wt 180.0 lb

## 2023-08-08 DIAGNOSIS — E785 Hyperlipidemia, unspecified: Secondary | ICD-10-CM

## 2023-08-08 DIAGNOSIS — H409 Unspecified glaucoma: Secondary | ICD-10-CM | POA: Diagnosis not present

## 2023-08-08 DIAGNOSIS — G629 Polyneuropathy, unspecified: Secondary | ICD-10-CM | POA: Diagnosis not present

## 2023-08-08 DIAGNOSIS — I5032 Chronic diastolic (congestive) heart failure: Secondary | ICD-10-CM

## 2023-08-08 MED ORDER — LATANOPROST 0.005 % OP SOLN: 1.0000 [drp] | Freq: Every day | OPHTHALMIC | 2 refills | Status: DC

## 2023-08-08 MED ORDER — PRAVASTATIN SODIUM 80 MG PO TABS
80.0000 mg | ORAL_TABLET | Freq: Every day | ORAL | 1 refills | Status: AC
Start: 2023-08-08 — End: ?

## 2023-08-08 MED ORDER — POTASSIUM CHLORIDE CRYS ER 20 MEQ PO TBCR
20.0000 meq | EXTENDED_RELEASE_TABLET | Freq: Two times a day (BID) | ORAL | 3 refills | Status: DC
Start: 2023-08-08 — End: 2023-10-07

## 2023-08-08 NOTE — Progress Notes (Signed)
[facility]  Provider:  Kenard Gower DNP  Code Status:  DNR  Goals of Care:     07/17/2023    9:47 AM  Advanced Directives  Does Patient Have a Medical Advance Directive? Yes  Type of Advance Directive Out of facility DNR (pink MOST or yellow form)  Does patient want to make changes to medical advance directive? No - Patient declined  Pre-existing out of facility DNR order (yellow form or pink MOST form) Yellow form placed in chart (order not valid for inpatient use)     Chief Complaint  Patient presents with   Medication Management    medication issues     HPI: Patient is a 89 y.o. female seen today for an acute visit for medication issues. Her pharmacy was sold out and she wants some of her medications refilled to another pharmacy.  Neuropathy  -  right thumb painful, uses wrist brace with thumb support, takes Gabapentin  Glaucoma of both eyes, unspecified glaucoma type -  no pai in eyes, no change in vision, takes Latanoprost eye drops  Dyslipidemia -  chol 125, triglycerides 131, LDL 44, takes Zetia and Pravastatin  Chronic diastolic CHF (congestive heart failure) (HCC)-  no shortness of breath, takes Torsemide   Wt Readings from Last 3 Encounters:  08/08/23 180 lb (81.6 kg)  07/17/23 183 lb 6.4 oz (83.2 kg)  07/11/23 178 lb (80.7 kg)     Past Medical History:  Diagnosis Date   Arthritis    right knee   Cancer (HCC) yrs ago   skin cancer removed from face   Chronic diastolic CHF (congestive heart failure) (HCC)    Chronic venous insufficiency    LEA VENOUS, 10/17/2011 - mild reflux in bilateral common femoral veins   CKD (chronic kidney disease), stage III (HCC)    Coronary artery disease    a. s/p PCI/BMS to prox LAD and balloon angioplasty to mLAD with suboptimal result in 2000. b. Abnl nuc 2012, cath 09/2011 - showed that the overall territory of potential ischemia was small and attributable to a moderately diseased small second diagonal artery.  Med rx.   Dyspnea    with activity   Headache    History of blood transfusion 2017   Hyperlipidemia    Hypertension    Hypertensive heart disease    Hypothyroidism    Obesity    Pneumonia    several times   Stroke [facility])    pt. states she had "light stroke" in sept. 1980   Urinary incontinence     Past Surgical History:  Procedure Laterality Date   ABDOMINAL HYSTERECTOMY  1970's   complete   ANKLE ARTHROSCOPY Right 07/10/2017   Procedure: ANKLE ARTHROSCOPY;  Surgeon: Park Liter, DPM;  Location: [facility] OR;  Service: Podiatry;  Laterality: Right;   BACK SURGERY  07/26/2016   cervical neck surgery, [facility]   BALLOON DILATION N/A 12/01/2021   Procedure: BALLOON DILATION;  Surgeon: Jeani Hawking, MD;  Location: [facility] ENDOSCOPY;  Service: Gastroenterology;  Laterality: N/A;   BLADDER SURGERY  2010   WITH MESH  bladder tach   bunion removal surgery Bilateral 15 yrs ago   CARDIAC CATHETERIZATION Left 09/25/2011   Medical management   CARDIAC CATHETERIZATION Left 03/25/2001   Normal LV function, LAD residual narrowing of less than 10%, normal ramus intermediate, circumflex, and RCA,    CARDIAC CATHETERIZATION  09/04/1999   LAD, 3x21mm Tetra stent resulting in a reduction of the 80% stenosis  to 0% residual   CARDIAC CATHETERIZATION N/A 01/26/2015   Procedure: Right/Left Heart Cath and Coronary Angiography;  Surgeon: Lennette Bihari, MD;  Location: [facility] INVASIVE CV LAB;  Service: Cardiovascular;  Laterality: N/A;   CARDIAC CATHETERIZATION N/A 01/27/2015   Procedure: Intravascular Pressure Wire/FFR Study;  Surgeon: Kathleene Hazel, MD;  Location: [facility] INVASIVE CV LAB;  Service: Cardiovascular;  Laterality: N/A;   CARDIAC CATHETERIZATION N/A 01/27/2015   Procedure: Right Heart Cath;  Surgeon: Kathleene Hazel, MD;  Location: [facility] INVASIVE CV LAB;  Service: Cardiovascular;  Laterality: N/A;   CHOLECYSTECTOMY     COLONOSCOPY WITH PROPOFOL N/A 03/07/2017   Procedure:  COLONOSCOPY WITH PROPOFOL;  Surgeon: Charna Elizabeth, MD;  Location: [facility];  Service: Endoscopy;  Laterality: N/A;   CORONARY PRESSURE/FFR STUDY N/A 04/05/2017   Procedure: Intravascular Pressure Wire/FFR Study;  Surgeon: Swaziland, Peter M, MD;  Location: [facility] INVASIVE CV LAB;  Service: Cardiovascular;  Laterality: N/A;   CORONARY STENT PLACEMENT     LAD x 1   ESOPHAGOGASTRODUODENOSCOPY (EGD) WITH PROPOFOL N/A 12/01/2021   Procedure: ESOPHAGOGASTRODUODENOSCOPY (EGD) WITH PROPOFOL;  Surgeon: Jeani Hawking, MD;  Location: [facility];  Service: Gastroenterology;  Laterality: N/A;   ganglion cyst removal  yrs ago   x 2   ILIAC VEIN ANGIOPLASTY / STENTING  02/15/2015   IVC FILTER INSERTION  2016   IVC FILTER REMOVAL  02/15/2015   at [facility]   LEFT HEART CATH AND CORONARY ANGIOGRAPHY N/A 04/05/2017   Procedure: Left Heart Cath and Coronary Angiography;  Surgeon: Swaziland, Peter M, MD;  Location: [facility] INVASIVE CV LAB;  Service: Cardiovascular;  Laterality: N/A;   LEFT HEART CATHETERIZATION WITH CORONARY ANGIOGRAM N/A 09/25/2011   Procedure: LEFT HEART CATHETERIZATION WITH CORONARY ANGIOGRAM;  Surgeon: Thurmon Fair, MD;  Location: [facility] CATH LAB;  Service: Cardiovascular;  Laterality: N/A;   multiple bladder surgeries to remove mesh     ROTATOR CUFF REPAIR Right    stent to groin Left 08/2014   left leg   TENDON REPAIR Right 07/10/2017   Procedure: RIGHT PERONEAL TENDON REPAIR;  Surgeon: Park Liter, DPM;  Location: [facility] OR;  Service: Podiatry;  Laterality: Right;   TENDON REPAIR Right 05/27/2019   Procedure: PERONEAL TENDON REPAIR x2;  Surgeon: Park Liter, DPM;  Location: [facility] ORS;  Service: Podiatry;  Laterality: Right;    Allergies  Allergen Reactions   Atorvastatin Anaphylaxis and Other (See Comments)    Patient does not remember; caused pneumonia- took her off of it per patient   Penicillins Anaphylaxis, Hives, Swelling and Other (See Comments)    Tongue swelling Did it involve swelling of  the face/tongue/throat, SOB, or low BP? Yes Did it involve sudden or severe rash/hives, skin peeling, or any reaction on the inside of your mouth or nose? Yes Did you need to seek medical attention at a hospital or doctor's office? Yes When did it last happen?  62 or 89 years old    If all above answers are "NO", may proceed with cephalosporin use.    Shellfish Allergy Anaphylaxis, Nausea And Vomiting and Other (See Comments)    Severe nausea and vomiting   Aminophylline Itching, Swelling, Rash and Other (See Comments)    Pt experienced burning urination, itching, redness/rash, and swelling of genitals after given this medication via IV push.   Iodinated Contrast Media Swelling and Other (See Comments)    FLUSHING, also; LLE became swollen   Latex Other (See Comments)    Causes blisters  Oxybutynin Chloride Other (See Comments)    "blisters"   Propoxyphene Other (See Comments)    Unknown reaction   Vancomycin Hives and Other (See Comments)    06/02/2018- Hives arm, back and chest   Adhesive [Tape] Rash and Other (See Comments)    Paper tape is ok   Betadine [Povidone Iodine] Rash   Ciprofloxacin Hives   Codeine Nausea And Vomiting    .   Ranolazine Other (See Comments)    Constipation  .    Outpatient Encounter Medications as of 08/08/2023  Medication Sig   ezetimibe (ZETIA) 10 MG tablet Take 1 tablet (10 mg total) by mouth every morning.   gabapentin (NEURONTIN) 300 MG capsule Take 1 capsule (300 mg total) by mouth at bedtime.   isosorbide mononitrate (IMDUR) 30 MG 24 hr tablet Take 1 tablet (30 mg total) by mouth daily.   latanoprost (XALATAN) 0.005 % ophthalmic solution Place 1 drop into both eyes at bedtime.   levothyroxine (SYNTHROID) 75 MCG tablet Take 1 tablet (75 mcg total) by mouth daily before breakfast.   nystatin cream (MYCOSTATIN) APPLY 1 APPLICATION TOPICALLY TWICE DAILY *REFILL REQUEST* (Patient taking differently: 1 Application 2 (two) times daily as needed  (itch).)   OVER THE COUNTER MEDICATION Take 1 capsule by mouth daily. OmegaXL joint and muscle support   potassium chloride SA (KLOR-CON M) 20 MEQ tablet TAKE ONE TABLET BY MOUTH TWICE DAILY   pravastatin (PRAVACHOL) 80 MG tablet Take 80 mg by mouth at bedtime.   sertraline (ZOLOFT) 25 MG tablet TAKE ONE TABLET BY MOUTH ONCE DAILY   torsemide (DEMADEX) 100 MG tablet Take 0.5 tablets (50 mg total) by mouth daily.   TYLENOL 8 HOUR ARTHRITIS PAIN 650 MG CR tablet Take 650 mg by mouth every 8 (eight) hours as needed for pain.   No facility-administered encounter medications on file as of 08/08/2023.    Review of Systems:  Review of Systems  Constitutional:  Negative for appetite change, chills, fatigue and fever.  HENT:  Negative for congestion, hearing loss, rhinorrhea and sore throat.   Eyes: Negative.   Respiratory:  Negative for cough, shortness of breath and wheezing.   Cardiovascular:  Negative for chest pain, palpitations and leg swelling.  Gastrointestinal:  Negative for abdominal pain, constipation, diarrhea, nausea and vomiting.  Genitourinary:  Negative for dysuria.  Musculoskeletal:  Negative for arthralgias, back pain and myalgias.  Skin:  Negative for color change, rash and wound.  Neurological:  Negative for dizziness, weakness and headaches.  Psychiatric/Behavioral:  Negative for behavioral problems. The patient is not nervous/anxious.     Health Maintenance  Topic Date Due   Medicare Annual Wellness (AWV)  07/16/2024   DTaP/Tdap/Td (3 - Td or Tdap) 11/12/2032   Pneumonia Vaccine 60+ Years old  Completed   DEXA SCAN  Completed   HPV VACCINES  Aged Out   INFLUENZA VACCINE  Discontinued   COVID-19 Vaccine  Discontinued   Zoster Vaccines- Shingrix  Discontinued    Physical Exam: Vitals:   08/08/23 0922  BP: 128/78  Pulse: 63  Resp: 18  Temp: 97.7 F (36.5 C)  SpO2: 93%  Weight: 180 lb (81.6 kg)  Height: 5' 5.5" (1.664 m)   Body mass index is 29.5  kg/m. Physical Exam Constitutional:      Appearance: She is obese.  HENT:     Head: Normocephalic and atraumatic.     Nose: Nose normal.     Mouth/Throat:     Mouth: Mucous  membranes are moist.  Eyes:     Conjunctiva/sclera: Conjunctivae normal.  Cardiovascular:     Rate and Rhythm: Normal rate and regular rhythm.  Pulmonary:     Effort: Pulmonary effort is normal.     Breath sounds: Normal breath sounds.  Abdominal:     General: Bowel sounds are normal.     Palpations: Abdomen is soft.  Musculoskeletal:        General: Normal range of motion.     Cervical back: Normal range of motion.  Skin:    General: Skin is warm and dry.  Neurological:     General: No focal deficit present.     Mental Status: She is alert and oriented to person, place, and time.  Psychiatric:        Mood and Affect: Mood normal.        Behavior: Behavior normal.        Thought Content: Thought content normal.        Judgment: Judgment normal.     Labs reviewed: Basic Metabolic Panel: Recent Labs    09/04/22 0938 11/12/22 1915 11/15/22 0453 12/11/22 1459 03/04/23 1448 04/23/23 1125 05/14/23 1102 06/04/23 1027 06/30/23 1501  NA 142   < > 137   < >  --   --  141 142 140  K 3.4*   < > 3.8   < >  --   --  3.9 3.3* 4.7  CL 101   < > 100   < >  --   --  102 103 100  CO2 33*   < > 30   < >  --   --  30* 33* 29  GLUCOSE 101*   < > 98   < >  --   --  83 116* 88  BUN 18   < > 15   < >  --   --  19 19 17   CREATININE 0.82   < > 0.74   < >  --   --  0.80 0.85 0.79  CALCIUM 9.5   < > 8.5*   < >  --   --  9.5 9.2 9.3  MG  --   --  2.0  --   --   --   --   --   --   TSH 0.08*  --   --   --  0.20* 0.61  --   --   --    < > = values in this interval not displayed.   Liver Function Tests: Recent Labs    11/13/22 0624 05/28/23 1029 06/04/23 1027 06/30/23 1501  AST 30 29 32 42*  ALT 16 19 20 23   ALKPHOS 71  --  119 91  BILITOT 1.2 1.2 1.3* 1.3*  PROT 5.7* 6.1 6.2* 5.7*  ALBUMIN 2.8*  --  3.4*  3.2*   No results for input(s): "LIPASE", "AMYLASE" in the last 8760 hours. No results for input(s): "AMMONIA" in the last 8760 hours. CBC: Recent Labs    05/28/23 1029 06/04/23 1027 06/30/23 1501  WBC 5.4 4.8 4.4  NEUTROABS 3,667 3.2 3.0  HGB 14.7 14.4 14.6  HCT 43.9 43.6 45.1  MCV 99.5 101.6* 103.4*  PLT 94* 75* 78*   Lipid Panel: Recent Labs    09/04/22 0938 02/04/23 1156  CHOL 162 125  HDL 52 64  LDLCALC 87 44  TRIG 131 90  CHOLHDL 3.1 2.0   Lab Results  Component Value Date  HGBA1C 6.1 (H) 01/26/2015    Procedures since last visit: No results found.  Assessment/Plan  1. Neuropathy -  stable -  continue Gabapentin  2. Glaucoma of both eyes, unspecified glaucoma type -  no change in vision - latanoprost (XALATAN) 0.005 % ophthalmic solution; Place 1 drop into both eyes at bedtime.  Dispense: 2.5 mL; Refill: 2  3. Dyslipidemia Lab Results  Component Value Date   CHOL 125 02/04/2023   HDL 64 02/04/2023   LDLCALC 44 02/04/2023   TRIG 90 02/04/2023   CHOLHDL 2.0 02/04/2023    -  continue Pravastatin and Zetia - pravastatin (PRAVACHOL) 80 MG tablet; Take 1 tablet (80 mg total) by mouth at bedtime.  Dispense: 90 tablet; Refill: 1  4. Chronic diastolic CHF (congestive heart failure) (HCC) -  no SOB -  continue Torsemide, Imdur, and KCL - potassium chloride SA (KLOR-CON M) 20 MEQ tablet; Take 1 tablet (20 mEq total) by mouth 2 (two) times daily.  Dispense: 180 tablet; Refill: 3    Labs/tests ordered:   None  Next appt:  09/20/2023

## 2023-08-23 ENCOUNTER — Encounter: Payer: Self-pay | Admitting: Family

## 2023-08-23 ENCOUNTER — Telehealth (INDEPENDENT_AMBULATORY_CARE_PROVIDER_SITE_OTHER): Payer: HMO | Admitting: Family

## 2023-08-23 DIAGNOSIS — R42 Dizziness and giddiness: Secondary | ICD-10-CM | POA: Diagnosis not present

## 2023-08-23 MED ORDER — MECLIZINE HCL 12.5 MG PO TABS
12.5000 mg | ORAL_TABLET | Freq: Three times a day (TID) | ORAL | 0 refills | Status: DC | PRN
Start: 2023-08-23 — End: 2023-09-17

## 2023-08-23 NOTE — Progress Notes (Signed)
This service is provided via telemedicine  No vital signs collected/recorded due to the encounter was a telemedicine visit.   Location of patient (ex: home, work):  Home  Patient consents to a telephone visit:    Location of the provider (ex: office, home):  Work  Name of any referring provider:  Yasenia Reedy, [name], NP   Names of all persons participating in the telemedicine service and their role in the encounter:  Rosiland Oz; Judithann Sauger McClurkin,CMA; Azariya Freeman,NP  Time spent on call: 6 minutes    Location:      Place of Service:    Provider: Mell Mellott FNP-C  Masahiro Iglesia, [name], NP  Patient Care Team: Takiah Maiden, [name], NP as PCP - General (Family Medicine) Croitoru, Rachelle Hora, MD as PCP - Cardiology (Cardiology) Croitoru, Rachelle Hora, MD as Consulting Physician (Cardiology) Cherlyn Roberts, MD as Referring Physician (Dermatology) Park Liter, DPM (Inactive) as Consulting Physician (Podiatry) Burundi, [name], OD (Optometry) Jerrell Mylar, MD as Referring Physician (Gynecology)  Extended Emergency Contact Information Primary Emergency Contact: Smithson,[name] Home Phone: [phone] Mobile Phone: [phone] Relation: Son Secondary Emergency Contact: [facility] Phone: [phone] Mobile Phone: [phone] Relation: Friend  Code Status:  Full Code  Goals of care: Advanced Directive information    07/17/2023    9:47 AM  Advanced Directives  Does Patient Have a Medical Advance Directive? Yes  Type of Advance Directive Out of facility DNR (pink MOST or yellow form)  Does patient want to make changes to medical advance directive? No - Patient declined  Pre-existing out of facility DNR order (yellow form or pink MOST form) Yellow form placed in chart (order not valid for inpatient use)     Chief Complaint  Patient presents with   Acute Visit    Patient reports vertigo today and states it  is worse than usual. She reports vomiting.     HPI:  Pt is a 89 y.o. female seen today for an acute visit for evaluation of worsening dizziness since this morning.States tried to get out of bed to use the bedside commode but became  swimmy headed.she wet the floor and just sat back on the bed.Later had an episode of nausea and vomited.she did not eat her breakfast but has drunk Ginger ale which has helped to settle her stomach.Rolling in bed or moving head or eyes worsen the the dizziness.she denies any recent infection, headache,vision changes,fatigue,chest tightness,palpitation,chest pain or shortness of breath.   Has had similar symptoms in the past which responded well to meclizine.     Past Medical History:  Diagnosis Date   Arthritis    right knee   Cancer (HCC) yrs ago   skin cancer removed from face   Chronic diastolic CHF (congestive heart failure) (HCC)    Chronic venous insufficiency    LEA VENOUS, 10/17/2011 - mild reflux in bilateral common femoral veins   CKD (chronic kidney disease), stage III (HCC)    Coronary artery disease    a. s/p PCI/BMS to prox LAD and balloon angioplasty to mLAD with suboptimal result in 2000. b. Abnl nuc 2012, cath 09/2011 - showed that the overall territory of potential ischemia was small and attributable to a moderately diseased small second diagonal artery. Med rx.   Dyspnea    with activity   Headache    History of blood transfusion 2017   Hyperlipidemia    Hypertension  Hypertensive heart disease    Hypothyroidism    Obesity    Pneumonia    several times   Stroke [facility])    pt. states she had "light stroke" in sept. 1980   Urinary incontinence    Past Surgical History:  Procedure Laterality Date   ABDOMINAL HYSTERECTOMY  1970's   complete   ANKLE ARTHROSCOPY Right 07/10/2017   Procedure: ANKLE ARTHROSCOPY;  Surgeon: Park Liter, DPM;  Location: [facility] OR;  Service: Podiatry;  Laterality: Right;   BACK SURGERY  07/26/2016   cervical  neck surgery, [facility]   BALLOON DILATION N/A 12/01/2021   Procedure: BALLOON DILATION;  Surgeon: Jeani Hawking, MD;  Location: [facility] ENDOSCOPY;  Service: Gastroenterology;  Laterality: N/A;   BLADDER SURGERY  2010   WITH MESH  bladder tach   bunion removal surgery Bilateral 15 yrs ago   CARDIAC CATHETERIZATION Left 09/25/2011   Medical management   CARDIAC CATHETERIZATION Left 03/25/2001   Normal LV function, LAD residual narrowing of less than 10%, normal ramus intermediate, circumflex, and RCA,    CARDIAC CATHETERIZATION  09/04/1999   LAD, 3x26mm Tetra stent resulting in a reduction of the 80% stenosis to 0% residual   CARDIAC CATHETERIZATION N/A 01/26/2015   Procedure: Right/Left Heart Cath and Coronary Angiography;  Surgeon: Lennette Bihari, MD;  Location: [facility] INVASIVE CV LAB;  Service: Cardiovascular;  Laterality: N/A;   CARDIAC CATHETERIZATION N/A 01/27/2015   Procedure: Intravascular Pressure Wire/FFR Study;  Surgeon: Kathleene Hazel, MD;  Location: [facility] INVASIVE CV LAB;  Service: Cardiovascular;  Laterality: N/A;   CARDIAC CATHETERIZATION N/A 01/27/2015   Procedure: Right Heart Cath;  Surgeon: Kathleene Hazel, MD;  Location: [facility] INVASIVE CV LAB;  Service: Cardiovascular;  Laterality: N/A;   CHOLECYSTECTOMY     COLONOSCOPY WITH PROPOFOL N/A 03/07/2017   Procedure: COLONOSCOPY WITH PROPOFOL;  Surgeon: Charna Elizabeth, MD;  Location: [facility] ENDOS[facility];  Service: Endoscopy;  Laterality: N/A;   CORONARY PRESSURE/FFR STUDY N/A 04/05/2017   Procedure: Intravascular Pressure Wire/FFR Study;  Surgeon: Swaziland, Peter M, MD;  Location: [facility] INVASIVE CV LAB;  Service: Cardiovascular;  Laterality: N/A;   CORONARY STENT PLACEMENT     LAD x 1   ESOPHAGOGASTRODUODENOSCOPY (EGD) WITH PROPOFOL N/A 12/01/2021   Procedure: ESOPHAGOGASTRODUODENOSCOPY (EGD) WITH PROPOFOL;  Surgeon: Jeani Hawking, MD;  Location: [facility];  Service: Gastroenterology;  Laterality: N/A;   ganglion cyst removal  yrs  ago   x 2   ILIAC VEIN ANGIOPLASTY / STENTING  02/15/2015   IVC FILTER INSERTION  2016   IVC FILTER REMOVAL  02/15/2015   at [facility]   LEFT HEART CATH AND CORONARY ANGIOGRAPHY N/A 04/05/2017   Procedure: Left Heart Cath and Coronary Angiography;  Surgeon: Swaziland, Peter M, MD;  Location: [facility] INVASIVE CV LAB;  Service: Cardiovascular;  Laterality: N/A;   LEFT HEART CATHETERIZATION WITH CORONARY ANGIOGRAM N/A 09/25/2011   Procedure: LEFT HEART CATHETERIZATION WITH CORONARY ANGIOGRAM;  Surgeon: Thurmon Fair, MD;  Location: [facility] CATH LAB;  Service: Cardiovascular;  Laterality: N/A;   multiple bladder surgeries to remove mesh     ROTATOR CUFF REPAIR Right    stent to groin Left 08/2014   left leg   TENDON REPAIR Right 07/10/2017   Procedure: RIGHT PERONEAL TENDON REPAIR;  Surgeon: Park Liter, DPM;  Location: [facility] OR;  Service: Podiatry;  Laterality: Right;   TENDON REPAIR Right 05/27/2019   Procedure: PERONEAL TENDON REPAIR x2;  Surgeon: Park Liter, DPM;  Location: [facility] ORS;  Service: Podiatry;  Laterality: Right;    Allergies  Allergen Reactions   Atorvastatin Anaphylaxis and Other (See Comments)    Patient does not remember; caused pneumonia- took her off of it per patient   Penicillins Anaphylaxis, Hives, Swelling and Other (See Comments)    Tongue swelling Did it involve swelling of the face/tongue/throat, SOB, or low BP? Yes Did it involve sudden or severe rash/hives, skin peeling, or any reaction on the inside of your mouth or nose? Yes Did you need to seek medical attention at a hospital or doctor's office? Yes When did it last happen?  24 or 89 years old    If all above answers are "NO", may proceed with cephalosporin use.    Shellfish Allergy Anaphylaxis, Nausea And Vomiting and Other (See Comments)    Severe nausea and vomiting   Aminophylline Itching, Swelling, Rash and Other (See Comments)    Pt experienced burning urination, itching, redness/rash, and swelling of genitals  after given this medication via IV push.   Iodinated Contrast Media Swelling and Other (See Comments)    FLUSHING, also; LLE became swollen   Latex Other (See Comments)    Causes blisters   Oxybutynin Chloride Other (See Comments)    "blisters"   Propoxyphene Other (See Comments)    Unknown reaction   Vancomycin Hives and Other (See Comments)    06/02/2018- Hives arm, back and chest   Adhesive [Tape] Rash and Other (See Comments)    Paper tape is ok   Betadine [Povidone Iodine] Rash   Ciprofloxacin Hives   Codeine Nausea And Vomiting    .   Ranolazine Other (See Comments)    Constipation  .    Outpatient Encounter Medications as of 08/23/2023  Medication Sig   ezetimibe (ZETIA) 10 MG tablet Take 1 tablet (10 mg total) by mouth every morning.   gabapentin (NEURONTIN) 300 MG capsule Take 1 capsule (300 mg total) by mouth at bedtime.   isosorbide mononitrate (IMDUR) 30 MG 24 hr tablet Take 1 tablet (30 mg total) by mouth daily.   latanoprost (XALATAN) 0.005 % ophthalmic solution Place 1 drop into both eyes at bedtime.   levothyroxine (SYNTHROID) 75 MCG tablet Take 1 tablet (75 mcg total) by mouth daily before breakfast.   nystatin cream (MYCOSTATIN) APPLY 1 APPLICATION TOPICALLY TWICE DAILY *REFILL REQUEST* (Patient taking differently: 1 Application 2 (two) times daily as needed (itch).)   OVER THE COUNTER MEDICATION Take 1 capsule by mouth daily. OmegaXL joint and muscle support   potassium chloride SA (KLOR-CON M) 20 MEQ tablet Take 1 tablet (20 mEq total) by mouth 2 (two) times daily.   pravastatin (PRAVACHOL) 80 MG tablet Take 1 tablet (80 mg total) by mouth at bedtime.   sertraline (ZOLOFT) 25 MG tablet TAKE ONE TABLET BY MOUTH ONCE DAILY   torsemide (DEMADEX) 100 MG tablet Take 0.5 tablets (50 mg total) by mouth daily.   TYLENOL 8 HOUR ARTHRITIS PAIN 650 MG CR tablet Take 650 mg by mouth every 8 (eight) hours as needed for pain.   No facility-administered encounter medications  on file as of 08/23/2023.    Review of Systems  Constitutional:  Negative for appetite change, chills, fatigue, fever and unexpected weight change.  HENT:  Negative for congestion, ear discharge, ear pain, facial swelling, hearing loss, nosebleeds, postnasal drip, rhinorrhea, sinus pressure, sinus pain, sneezing, sore throat, tinnitus and trouble swallowing.   Eyes:  Negative for pain, discharge, redness, itching and visual disturbance.  Respiratory:  Negative for cough, chest tightness, shortness of breath and wheezing.   Cardiovascular:  Negative for chest pain, palpitations and leg swelling.  Gastrointestinal:  Negative for abdominal distention, abdominal pain, blood in stool, constipation, diarrhea, nausea and vomiting.  Genitourinary:  Negative for difficulty urinating, dysuria, flank pain, frequency and urgency.  Musculoskeletal:  Positive for arthralgias and gait problem. Negative for back pain, joint swelling, myalgias, neck pain and neck stiffness.  Skin:  Negative for color change, pallor, rash and wound.  Neurological:  Positive for dizziness. Negative for syncope, speech difficulty, weakness, light-headedness, numbness and headaches.  Hematological:  Does not bruise/bleed easily.    Immunization History  Administered Date(s) Administered   PFIZER(Purple Top)SARS-COV-2 Vaccination 11/11/2019, 11/13/2019, 12/08/2019   Pneumococcal Conjugate-13 06/14/2014   Pneumococcal Polysaccharide-23 10/26/2013, 03/30/2020   Tdap 12/19/2015, 11/12/2022   Pertinent  Health Maintenance Due  Topic Date Due   DEXA SCAN  Completed   INFLUENZA VACCINE  Discontinued      06/25/2023   11:17 AM 07/15/2023    1:08 PM 07/17/2023    9:47 AM 08/08/2023    9:21 AM 08/23/2023   11:11 AM  Fall Risk  Falls in the past year? 1 1 1  0 0  Was there an injury with Fall? 1 1 0 0 1  Fall Risk Category Calculator 2 2 1  0 1  Patient at Risk for Falls Due to History of fall(s);Impaired balance/gait;Impaired  mobility History of fall(s) History of fall(s) History of fall(s) History of fall(s)  Fall risk Follow up Falls evaluation completed;Education provided;Falls prevention discussed Falls evaluation completed;Education provided Falls evaluation completed Falls evaluation completed Falls evaluation completed   Functional Status Survey:    There were no vitals filed for this visit. There is no height or weight on file to calculate BMI. Physical Exam Constitutional:      General: She is not in acute distress.    Appearance: She is not ill-appearing.  Pulmonary:     Effort: Pulmonary effort is normal. No respiratory distress.  Neurological:     Mental Status: She is alert. Mental status is at baseline.     Gait: Gait abnormal.  Psychiatric:        Mood and Affect: Mood normal.        Behavior: Behavior normal.     Labs reviewed: Recent Labs    11/15/22 0453 12/11/22 1459 05/14/23 1102 06/04/23 1027 06/30/23 1501  NA 137   < > 141 142 140  K 3.8   < > 3.9 3.3* 4.7  CL 100   < > 102 103 100  CO2 30   < > 30* 33* 29  GLUCOSE 98   < > 83 116* 88  BUN 15   < > 19 19 17   CREATININE 0.74   < > 0.80 0.85 0.79  CALCIUM 8.5*   < > 9.5 9.2 9.3  MG 2.0  --   --   --   --    < > = values in this interval not displayed.   Recent Labs    11/13/22 0624 05/28/23 1029 06/04/23 1027 06/30/23 1501  AST 30 29 32 42*  ALT 16 19 20 23   ALKPHOS 71  --  119 91  BILITOT 1.2 1.2 1.3* 1.3*  PROT 5.7* 6.1 6.2* 5.7*  ALBUMIN 2.8*  --  3.4* 3.2*   Recent Labs    05/28/23 1029 06/04/23 1027 06/30/23 1501  WBC 5.4 4.8 4.4  NEUTROABS 3,667 3.2 3.0  HGB  14.7 14.4 14.6  HCT 43.9 43.6 45.1  MCV 99.5 101.6* 103.4*  PLT 94* 75* 78*   Lab Results  Component Value Date   TSH 0.61 04/23/2023   Lab Results  Component Value Date   HGBA1C 6.1 (H) 01/26/2015   Lab Results  Component Value Date   CHOL 125 02/04/2023   HDL 64 02/04/2023   LDLCALC 44 02/04/2023   TRIG 90 02/04/2023   CHOLHDL  2.0 02/04/2023    Significant Diagnostic Results in last 30 days:  No results found.  Assessment/Plan Vertigo Worsen with eye or head movement. Start on meclizine as below  - advised to notify provider or go to ED if symptoms worsen or fail to improve  - meclizine (ANTIVERT) 12.5 MG tablet; Take 1 tablet (12.5 mg total) by mouth 3 (three) times daily as needed for dizziness.  Dispense: 30 tablet; Refill: 0  Family/ staff Communication: Reviewed plan of care with patient verbalized understanding   Labs/tests ordered: None   Next Appointment: Return if symptoms worsen or fail to improve.  I connected with  [name] on 08/23/23 by a video enabled telemedicine application and verified that I am speaking with the correct person using two identifiers.   I discussed the limitations of evaluation and management by telemedicine. The patient expressed understanding and agreed to proceed.   Spent 11 minutes of face to face with patient  >50% time spent counseling; reviewing medical record; tests; labs; documenting and developing future plan of care.   [name], NP

## 2023-08-26 ENCOUNTER — Ambulatory Visit: Payer: PPO | Admitting: Family

## 2023-08-27 ENCOUNTER — Other Ambulatory Visit: Payer: Self-pay

## 2023-08-27 ENCOUNTER — Telehealth: Payer: Self-pay | Admitting: Cardiovascular Disease

## 2023-08-27 DIAGNOSIS — F411 Generalized anxiety disorder: Secondary | ICD-10-CM

## 2023-08-27 MED ORDER — TORSEMIDE 100 MG PO TABS: ORAL_TABLET | ORAL | Status: DC

## 2023-08-27 MED ORDER — AMLODIPINE BESYLATE 2.5 MG PO TABS: 2.5000 mg | ORAL_TABLET | Freq: Every day | ORAL | 3 refills | Status: DC

## 2023-08-27 MED ORDER — SERTRALINE HCL 25 MG PO TABS
25.0000 mg | ORAL_TABLET | Freq: Every day | ORAL | 3 refills | Status: DC
  Filled 2024-02-24 – 2024-04-10 (×2): qty 90, 90d supply, fill #0
  Filled 2024-07-02: qty 90, 90d supply, fill #1

## 2023-08-27 NOTE — Telephone Encounter (Signed)
  Patient is asking if she is supposed to still be taking Amlodipine 2.5 mg. It is not on her list but patient states she has been taking it. Please advise.

## 2023-08-27 NOTE — Telephone Encounter (Signed)
Please prescribe the amlodipine 2.5 mg daily and add to list

## 2023-08-27 NOTE — Telephone Encounter (Signed)
Spoke to patient she stated she has been taking Amlodipine 2.5 mg daily.Stated she needs a refill.Advised Amlodipine is not on medication list.Advised I will send message to Dr.Croitoru for clarification.

## 2023-08-27 NOTE — Telephone Encounter (Signed)
Patient called requesting a refill on zoloft and amlodipine. Patient was informed that we will refill zoloft however, the amlodipine is not on her medication list and has not been on there since February or March of this year.  Patient will call the specialist and find out about amlodipine. Patient will call back to update her medication list if it is determined that she is suppose to remain on it. Patient states she never stopped taking amlodipine.

## 2023-08-27 NOTE — Telephone Encounter (Signed)
Spoke to patient Dr.Croitoru's advice given.Amlodipine 2.5 mg refill sent to your pharmacy.

## 2023-08-28 NOTE — Telephone Encounter (Signed)
Not on amlodipine on previous visit.Patient to verify with specialist then update med list.

## 2023-09-17 ENCOUNTER — Other Ambulatory Visit: Payer: Self-pay | Admitting: Family

## 2023-09-17 DIAGNOSIS — H0102A Squamous blepharitis right eye, upper and lower eyelids: Secondary | ICD-10-CM | POA: Diagnosis not present

## 2023-09-17 DIAGNOSIS — H401131 Primary open-angle glaucoma, bilateral, mild stage: Secondary | ICD-10-CM | POA: Diagnosis not present

## 2023-09-17 DIAGNOSIS — H0102B Squamous blepharitis left eye, upper and lower eyelids: Secondary | ICD-10-CM | POA: Diagnosis not present

## 2023-09-17 DIAGNOSIS — R42 Dizziness and giddiness: Secondary | ICD-10-CM

## 2023-09-17 DIAGNOSIS — Z961 Presence of intraocular lens: Secondary | ICD-10-CM | POA: Diagnosis not present

## 2023-09-17 NOTE — Telephone Encounter (Signed)
Pharmacy requested refill.  ?Pended Rx and sent to Dinah for approval.  ?

## 2023-09-20 ENCOUNTER — Encounter: Payer: Self-pay | Admitting: Family

## 2023-09-20 ENCOUNTER — Ambulatory Visit (INDEPENDENT_AMBULATORY_CARE_PROVIDER_SITE_OTHER): Payer: HMO | Admitting: Family

## 2023-09-20 VITALS — BP 116/72 | HR 62 | Temp 97.9°F | Resp 17 | Ht 65.0 in | Wt 184.2 lb

## 2023-09-20 DIAGNOSIS — G8929 Other chronic pain: Secondary | ICD-10-CM | POA: Diagnosis not present

## 2023-09-20 DIAGNOSIS — M25571 Pain in right ankle and joints of right foot: Secondary | ICD-10-CM

## 2023-09-20 DIAGNOSIS — I5032 Chronic diastolic (congestive) heart failure: Secondary | ICD-10-CM | POA: Diagnosis not present

## 2023-09-20 DIAGNOSIS — E785 Hyperlipidemia, unspecified: Secondary | ICD-10-CM

## 2023-09-20 DIAGNOSIS — E039 Hypothyroidism, unspecified: Secondary | ICD-10-CM | POA: Diagnosis not present

## 2023-09-20 DIAGNOSIS — I10 Essential (primary) hypertension: Secondary | ICD-10-CM | POA: Diagnosis not present

## 2023-09-20 DIAGNOSIS — G629 Polyneuropathy, unspecified: Secondary | ICD-10-CM

## 2023-09-20 NOTE — Progress Notes (Signed)
 Provider: Roxan Plough FNP-C   Brittish Bolinger, Roxan BROCKS, NP  Patient Care Team: Jelina Paulsen, Roxan BROCKS, NP as PCP - General (Family Medicine) Croitoru, Jerel, MD as PCP - Cardiology (Cardiology) Croitoru, Jerel, MD as Consulting Physician (Cardiology) Ivin Kocher, MD as Referring Physician (Dermatology) Gretel Ozell PARAS, DPM (Inactive) as Consulting Physician (Podiatry) Oman, [name], OD (Optometry) Alvia Dorothyann LABOR, MD as Referring Physician (Gynecology)  Extended Emergency Contact Information Primary Emergency Contact: Gribble,Lewis Danya [place], [place] Home Phone: [phone] Mobile Phone: [phone] Relation: Son Secondary Emergency Contact: [facility] Phone: [phone] Mobile Phone: [phone] Relation: Friend  Code Status:  Full Code  Goals of care: Advanced Directive information    09/20/2023   10:33 AM  Advanced Directives  Does Patient Have a Medical Advance Directive? Yes  Type of Estate Agent of [place];Living will  Does patient want to make changes to medical advance directive? No - Patient declined  Copy of Healthcare Power of Attorney in Chart? Yes - validated most recent copy scanned in chart (See row information)     Chief Complaint  Patient presents with   Medical Management of Chronic Issues    6 month follow up     HPI:  Pt is a 89 y.o. female seen today for 6 months follow up for medical management of chronic diseases.  States has been doing well.No recent fall episode. Appetite is good.Adds protein supplement daily to her breakfast.  Total protein 6.2,Alb 3.4 ( 06/04/2023)   She complains of right ankle pain.states more painful with weight bearing.Has previous tendon tear.states would like to follow up with her Orthopedic last seen 05/14/2023.  States husband gets up sometimes at night confused but able to tell him to go back to sleep.Has a care giver that assist. Son has also  been taking to medical visit.   Past Medical History:  Diagnosis Date   Arthritis    right knee   Cancer (HCC) yrs ago   skin cancer removed from face   Chronic diastolic CHF (congestive heart failure) (HCC)    Chronic venous insufficiency    LEA VENOUS, 10/17/2011 - mild reflux in bilateral common femoral veins   CKD (chronic kidney disease), stage III (HCC)    Coronary artery disease    a. s/p PCI/BMS to prox LAD and balloon angioplasty to mLAD with suboptimal result in 2000. b. Abnl nuc 2012, cath 09/2011 - showed that the overall territory of potential ischemia was small and attributable to a moderately diseased small second diagonal artery. Med rx.   Dyspnea    with activity   Headache    History of blood transfusion 2017   Hyperlipidemia    Hypertension    Hypertensive heart disease    Hypothyroidism    Obesity    Pneumonia    several times   Stroke [facility])    pt. states she had light stroke in sept. 1980   Urinary incontinence    Past Surgical History:  Procedure Laterality Date   ABDOMINAL HYSTERECTOMY  1970's   complete   ANKLE ARTHROSCOPY Right 07/10/2017   Procedure: ANKLE ARTHROSCOPY;  Surgeon: Gretel Ozell PARAS, DPM;  Location: [facility] OR;  Service: Podiatry;  Laterality: Right;   BACK SURGERY  07/26/2016   cervical neck surgery, [facility]   BALLOON DILATION N/A 12/01/2021   Procedure: BALLOON DILATION;  Surgeon: Rollin Dover, MD;  Location: [facility] ENDOSCOPY;  Service: Gastroenterology;  Laterality: N/A;   BLADDER SURGERY  2010   WITH MESH  bladder tach   bunion removal surgery Bilateral 15 yrs ago   CARDIAC CATHETERIZATION Left 09/25/2011   Medical management   CARDIAC CATHETERIZATION Left 03/25/2001   Normal LV function, LAD residual narrowing of less than 10%, normal ramus intermediate, circumflex, and RCA,    CARDIAC CATHETERIZATION  09/04/1999   LAD, 3x55mm Tetra stent resulting in a reduction of the 80% stenosis to 0% residual   CARDIAC  CATHETERIZATION N/A 01/26/2015   Procedure: Right/Left Heart Cath and Coronary Angiography;  Surgeon: Debby DELENA Sor, MD;  Location: [facility] INVASIVE CV LAB;  Service: Cardiovascular;  Laterality: N/A;   CARDIAC CATHETERIZATION N/A 01/27/2015   Procedure: Intravascular Pressure Wire/FFR Study;  Surgeon: Lonni JONETTA Cash, MD;  Location: [facility] INVASIVE CV LAB;  Service: Cardiovascular;  Laterality: N/A;   CARDIAC CATHETERIZATION N/A 01/27/2015   Procedure: Right Heart Cath;  Surgeon: Lonni JONETTA Cash, MD;  Location: [facility] INVASIVE CV LAB;  Service: Cardiovascular;  Laterality: N/A;   CHOLECYSTECTOMY     COLONOSCOPY WITH PROPOFOL  N/A 03/07/2017   Procedure: COLONOSCOPY WITH PROPOFOL ;  Surgeon: Kristie Lamprey, MD;  Location: [facility] END[facility];  Service: Endoscopy;  Laterality: N/A;   CORONARY PRESSURE/FFR STUDY N/A 04/05/2017   Procedure: Intravascular Pressure Wire/FFR Study;  Surgeon: Jordan, Peter M, MD;  Location: [facility] INVASIVE CV LAB;  Service: Cardiovascular;  Laterality: N/A;   CORONARY STENT PLACEMENT     LAD x 1   ESOPHAGOGASTRODUODENOSCOPY (EGD) WITH PROPOFOL  N/A 12/01/2021   Procedure: ESOPHAGOGASTRODUODENOSCOPY (EGD) WITH PROPOFOL ;  Surgeon: Rollin Dover, MD;  Location: [facility];  Service: Gastroenterology;  Laterality: N/A;   ganglion cyst removal  yrs ago   x 2   ILIAC VEIN ANGIOPLASTY / STENTING  02/15/2015   IVC FILTER INSERTION  2016   IVC FILTER REMOVAL  02/15/2015   at [facility]   LEFT HEART CATH AND CORONARY ANGIOGRAPHY N/A 04/05/2017   Procedure: Left Heart Cath and Coronary Angiography;  Surgeon: Jordan, Peter M, MD;  Location: [facility] INVASIVE CV LAB;  Service: Cardiovascular;  Laterality: N/A;   LEFT HEART CATHETERIZATION WITH CORONARY ANGIOGRAM N/A 09/25/2011   Procedure: LEFT HEART CATHETERIZATION WITH CORONARY ANGIOGRAM;  Surgeon: Jerel Balding, MD;  Location: [facility] CATH LAB;  Service: Cardiovascular;  Laterality: N/A;   multiple bladder surgeries to remove mesh     ROTATOR CUFF REPAIR Right     stent to groin Left 08/2014   left leg   TENDON REPAIR Right 07/10/2017   Procedure: RIGHT PERONEAL TENDON REPAIR;  Surgeon: Gretel Ozell PARAS, DPM;  Location: [facility] OR;  Service: Podiatry;  Laterality: Right;   TENDON REPAIR Right 05/27/2019   Procedure: PERONEAL TENDON REPAIR x2;  Surgeon: Gretel Ozell PARAS, DPM;  Location: [facility] ORS;  Service: Podiatry;  Laterality: Right;    Allergies  Allergen Reactions   Atorvastatin Anaphylaxis and Other (See Comments)    Patient does not remember; caused pneumonia- took her off of it per patient   Penicillins Anaphylaxis, Hives, Swelling and Other (See Comments)    Tongue swelling Did it involve swelling of the face/tongue/throat, SOB, or low BP? Yes Did it involve sudden or severe rash/hives, skin peeling, or any reaction on the inside of your mouth or nose? Yes Did you need to seek medical attention at a hospital or doctor's office? Yes When did it last happen?  42 or 89 years old    If all above answers are NO, may proceed with cephalosporin use.  Shellfish Allergy Anaphylaxis, Nausea And Vomiting and Other (See Comments)    Severe nausea and vomiting   Aminophylline  Itching, Swelling, Rash and Other (See Comments)    Pt experienced burning urination, itching, redness/rash, and swelling of genitals after given this medication via IV push.   Iodinated Contrast Media Swelling and Other (See Comments)    FLUSHING, also; LLE became swollen   Latex Other (See Comments)    Causes blisters   Oxybutynin Chloride Other (See Comments)    blisters   Propoxyphene Other (See Comments)    Unknown reaction   Vancomycin  Hives and Other (See Comments)    06/02/2018- Hives arm, back and chest   Adhesive [Tape] Rash and Other (See Comments)    Paper tape is ok   Betadine [Povidone Iodine] Rash   Ciprofloxacin Hives   Codeine  Nausea And Vomiting    .   Ranolazine  Other (See Comments)    Constipation  .    Allergies as of 09/20/2023       Reactions    Atorvastatin Anaphylaxis, Other (See Comments)   Patient does not remember; caused pneumonia- took her off of it per patient   Penicillins Anaphylaxis, Hives, Swelling, Other (See Comments)   Tongue swelling Did it involve swelling of the face/tongue/throat, SOB, or low BP? Yes Did it involve sudden or severe rash/hives, skin peeling, or any reaction on the inside of your mouth or nose? Yes Did you need to seek medical attention at a hospital or doctor's office? Yes When did it last happen?  21 or 89 years old    If all above answers are NO, may proceed with cephalosporin use.   Shellfish Allergy Anaphylaxis, Nausea And Vomiting, Other (See Comments)   Severe nausea and vomiting   Aminophylline  Itching, Swelling, Rash, Other (See Comments)   Pt experienced burning urination, itching, redness/rash, and swelling of genitals after given this medication via IV push.   Iodinated Contrast Media Swelling, Other (See Comments)   FLUSHING, also; LLE became swollen   Latex Other (See Comments)   Causes blisters   Oxybutynin Chloride Other (See Comments)   blisters   Propoxyphene Other (See Comments)   Unknown reaction   Vancomycin  Hives, Other (See Comments)   06/02/2018- Hives arm, back and chest   Adhesive [tape] Rash, Other (See Comments)   Paper tape is ok   Betadine [povidone Iodine] Rash   Ciprofloxacin Hives   Codeine  Nausea And Vomiting   .   Ranolazine  Other (See Comments)   Constipation .        Medication List        Accurate as of September 20, 2023 10:46 AM. If you have any questions, ask your nurse or doctor.          amLODipine  2.5 MG tablet Commonly known as: NORVASC  Take 1 tablet (2.5 mg total) by mouth daily.   ezetimibe  10 MG tablet Commonly known as: ZETIA  Take 1 tablet (10 mg total) by mouth every morning.   gabapentin  300 MG capsule Commonly known as: NEURONTIN  Take 1 capsule (300 mg total) by mouth at bedtime.   isosorbide  mononitrate 30 MG 24  hr tablet Commonly known as: IMDUR  Take 1 tablet (30 mg total) by mouth daily.   latanoprost  0.005 % ophthalmic solution Commonly known as: XALATAN  Place 1 drop into both eyes at bedtime.   levothyroxine  75 MCG tablet Commonly known as: SYNTHROID  Take 1 tablet (75 mcg total) by mouth daily before breakfast.   meclizine   12.5 MG tablet Commonly known as: ANTIVERT  TAKE 1 TABLET(12.5 MG) BY MOUTH THREE TIMES DAILY AS NEEDED FOR DIZZINESS   nystatin  cream Commonly known as: MYCOSTATIN  APPLY 1 APPLICATION TOPICALLY TWICE DAILY *REFILL REQUEST* What changed: See the new instructions.   OVER THE COUNTER MEDICATION Take 1 capsule by mouth daily. OmegaXL joint and muscle support   potassium chloride  SA 20 MEQ tablet Commonly known as: KLOR-CON  M Take 1 tablet (20 mEq total) by mouth 2 (two) times daily.   pravastatin  80 MG tablet Commonly known as: PRAVACHOL  Take 1 tablet (80 mg total) by mouth at bedtime.   sertraline  25 MG tablet Commonly known as: ZOLOFT  Take 1 tablet (25 mg total) by mouth daily.   torsemide  100 MG tablet Commonly known as: DEMADEX  Take 1/2 tablet ( 50 mg ) twice a day   Tylenol  8 Hour Arthritis Pain 650 MG CR tablet Generic drug: acetaminophen  Take 650 mg by mouth every 8 (eight) hours as needed for pain.        Review of Systems  Constitutional:  Negative for appetite change, chills, fatigue, fever and unexpected weight change.  HENT:  Negative for congestion, dental problem, ear discharge, ear pain, hearing loss, nosebleeds, postnasal drip, rhinorrhea, sinus pressure, sinus pain, sneezing, sore throat, tinnitus and trouble swallowing.   Eyes:  Negative for pain, discharge, redness, itching and visual disturbance.  Respiratory:  Negative for cough, chest tightness, shortness of breath and wheezing.   Cardiovascular:  Negative for chest pain, palpitations and leg swelling.  Gastrointestinal:  Negative for abdominal distention, abdominal pain, blood in  stool, constipation, diarrhea, nausea and vomiting.  Endocrine: Negative for cold intolerance, heat intolerance, polydipsia, polyphagia and polyuria.  Genitourinary:  Negative for difficulty urinating, dysuria, flank pain, frequency and urgency.  Musculoskeletal:  Positive for arthralgias and gait problem. Negative for back pain, joint swelling, myalgias, neck pain and neck stiffness.  Skin:  Negative for color change, pallor, rash and wound.  Neurological:  Negative for dizziness, syncope, speech difficulty, weakness, light-headedness, numbness and headaches.  Hematological:  Does not bruise/bleed easily.  Psychiatric/Behavioral:  Negative for agitation, behavioral problems, confusion, hallucinations and sleep disturbance. The patient is not nervous/anxious.     Immunization History  Administered Date(s) Administered   PFIZER(Purple Top)SARS-COV-2 Vaccination 11/11/2019, 11/13/2019, 12/08/2019   Pneumococcal Conjugate-13 06/14/2014   Pneumococcal Polysaccharide-23 10/26/2013, 03/30/2020   Tdap 12/19/2015, 11/12/2022   Pertinent  Health Maintenance Due  Topic Date Due   INFLUENZA VACCINE  12/16/2023 (Originally 04/18/2023)   DEXA SCAN  Completed      07/15/2023    1:08 PM 07/17/2023    9:47 AM 08/08/2023    9:21 AM 08/23/2023   11:11 AM 09/20/2023   10:31 AM  Fall Risk  Falls in the past year? 1 1 0 0 0  Was there an injury with Fall? 1 0 0 1 0  Fall Risk Category Calculator 2 1 0 1 0  Patient at Risk for Falls Due to History of fall(s) History of fall(s) History of fall(s) History of fall(s)   Fall risk Follow up Falls evaluation completed;Education provided Falls evaluation completed Falls evaluation completed Falls evaluation completed    Functional Status Survey:    Vitals:   09/20/23 1034  BP: 116/72  Pulse: 62  Resp: 17  Temp: 97.9 F (36.6 C)  SpO2: 94%  Weight: 184 lb 3.2 oz (83.6 kg)  Height: 5' 5 (1.651 m)   Body mass index is 30.65 kg/m. Physical  Exam Vitals  reviewed.  Constitutional:      General: She is not in acute distress.    Appearance: Normal appearance. She is obese. She is not ill-appearing or diaphoretic.  HENT:     Head: Normocephalic.     Right Ear: Tympanic membrane, ear canal and external ear normal. There is no impacted cerumen.     Left Ear: Tympanic membrane, ear canal and external ear normal. There is no impacted cerumen.     Nose: Nose normal. No congestion or rhinorrhea.     Mouth/Throat:     Mouth: Mucous membranes are moist.     Pharynx: Oropharynx is clear. No oropharyngeal exudate or posterior oropharyngeal erythema.  Eyes:     General: No scleral icterus.       Right eye: No discharge.        Left eye: No discharge.     Extraocular Movements: Extraocular movements intact.     Conjunctiva/sclera: Conjunctivae normal.     Pupils: Pupils are equal, round, and reactive to light.  Neck:     Vascular: No carotid bruit.  Cardiovascular:     Rate and Rhythm: Normal rate and regular rhythm.     Pulses: Normal pulses.     Heart sounds: Normal heart sounds. No murmur heard.    No friction rub. No gallop.  Pulmonary:     Effort: Pulmonary effort is normal. No respiratory distress.     Breath sounds: Normal breath sounds. No wheezing, rhonchi or rales.  Chest:     Chest wall: No tenderness.  Abdominal:     General: Bowel sounds are normal. There is no distension.     Palpations: Abdomen is soft. There is no mass.     Tenderness: There is no abdominal tenderness. There is no right CVA tenderness, left CVA tenderness, guarding or rebound.  Musculoskeletal:        General: No swelling. Normal range of motion.     Cervical back: Normal range of motion. No rigidity or tenderness.     Right lower leg: No edema.     Left lower leg: No edema.     Right ankle: No swelling or ecchymosis. Tenderness present over the lateral malleolus. Normal range of motion. Normal pulse.     Right Achilles Tendon: Tenderness  present.     Left ankle: Normal.     Left Achilles Tendon: Normal.  Lymphadenopathy:     Cervical: No cervical adenopathy.  Skin:    General: Skin is warm and dry.     Coloration: Skin is not pale.     Findings: No bruising, erythema, lesion or rash.  Neurological:     Mental Status: She is alert. Mental status is at baseline.     Cranial Nerves: No cranial nerve deficit.     Sensory: Sensory deficit present.     Motor: No weakness.     Coordination: Coordination normal.     Gait: Gait abnormal.     Comments: Decreased monofilament sensation to both feet   Psychiatric:        Mood and Affect: Mood normal.        Speech: Speech normal.        Behavior: Behavior normal.        Thought Content: Thought content normal.        Judgment: Judgment normal.     Labs reviewed: Recent Labs    11/15/22 0453 12/11/22 1459 05/14/23 1102 06/04/23 1027 06/30/23 1501  NA 137   < >  141 142 140  K 3.8   < > 3.9 3.3* 4.7  CL 100   < > 102 103 100  CO2 30   < > 30* 33* 29  GLUCOSE 98   < > 83 116* 88  BUN 15   < > 19 19 17   CREATININE 0.74   < > 0.80 0.85 0.79  CALCIUM 8.5*   < > 9.5 9.2 9.3  MG 2.0  --   --   --   --    < > = values in this interval not displayed.   Recent Labs    11/13/22 0624 05/28/23 1029 06/04/23 1027 06/30/23 1501  AST 30 29 32 42*  ALT 16 19 20 23   ALKPHOS 71  --  119 91  BILITOT 1.2 1.2 1.3* 1.3*  PROT 5.7* 6.1 6.2* 5.7*  ALBUMIN  2.8*  --  3.4* 3.2*   Recent Labs    05/28/23 1029 06/04/23 1027 06/30/23 1501  WBC 5.4 4.8 4.4  NEUTROABS 3,667 3.2 3.0  HGB 14.7 14.4 14.6  HCT 43.9 43.6 45.1  MCV 99.5 101.6* 103.4*  PLT 94* 75* 78*   Lab Results  Component Value Date   TSH 0.61 04/23/2023   Lab Results  Component Value Date   HGBA1C 6.1 (H) 01/26/2015   Lab Results  Component Value Date   CHOL 125 02/04/2023   HDL 64 02/04/2023   LDLCALC 44 02/04/2023   TRIG 90 02/04/2023   CHOLHDL 2.0 02/04/2023    Significant Diagnostic Results  in last 30 days:  No results found.  Assessment/Plan 1. Essential hypertension (Primary) B/p well controlled  -  continue on Amlodipine  and Imdur   - CBC with Differential/Platelet - COMPLETE METABOLIC PANEL WITH GFR  2. Neuropathy Decreased monofilament sensation to both feet.No ulceration.pulse intact. - advised to avoid walking bare feet - continue on gabapentin    3. Chronic diastolic CHF (congestive heart failure) (HCC) No signs of fluid overload  - continue on imdur  and Torsemide   - continue to follow up with Cardiologist  - CBC with Differential/Platelet - COMPLETE METABOLIC PANEL WITH GFR  4. Acquired hypothyroidism Lab Results  Component Value Date   TSH 0.61 04/23/2023  Continue on levothyroxine   - TSH  5. Chronic pain of right ankle Right achilles tendon tender to palpation.No erythema or swelling noted.Pain elicited with weight bearing.seen by [facility] for right ankle tendon tear in 2024.will refer for evaluation.  - Ambulatory referral to Orthopedic Surgery  6. Dyslipidemia Continue on pravastatin  and ezetimibe   - Lipid Panel  Family/ staff Communication: Reviewed plan of care with patient verbalized understanding   Labs/tests ordered:  - CBC with Differential/Platelet - COMPLETE METABOLIC PANEL WITH GFR - TSH  Next Appointment : Return in about 6 months (around 03/19/2024) for medical mangement of chronic issues..   Eneida Evers C Lennis Rader, NP

## 2023-09-21 LAB — COMPLETE METABOLIC PANEL WITH GFR
AG Ratio: 1.6 (calc) (ref 1.0–2.5)
ALT: 17 U/L (ref 6–29)
AST: 32 U/L (ref 10–35)
Albumin: 3.5 g/dL — ABNORMAL LOW (ref 3.6–5.1)
Alkaline phosphatase (APISO): 85 U/L (ref 37–153)
BUN: 18 mg/dL (ref 7–25)
CO2: 34 mmol/L — ABNORMAL HIGH (ref 20–32)
Calcium: 9 mg/dL (ref 8.6–10.4)
Chloride: 101 mmol/L (ref 98–110)
Creat: 0.94 mg/dL (ref 0.60–0.95)
Globulin: 2.2 g/dL (ref 1.9–3.7)
Glucose, Bld: 102 mg/dL (ref 65–139)
Potassium: 3.2 mmol/L — ABNORMAL LOW (ref 3.5–5.3)
Sodium: 143 mmol/L (ref 135–146)
Total Bilirubin: 1.2 mg/dL (ref 0.2–1.2)
Total Protein: 5.7 g/dL — ABNORMAL LOW (ref 6.1–8.1)
eGFR: 58 mL/min/{1.73_m2} — ABNORMAL LOW (ref >=60)

## 2023-09-21 LAB — CBC WITH DIFFERENTIAL/PLATELET
Absolute Lymphocytes: 1005 {cells}/uL (ref 850–3900)
Absolute Monocytes: 370 {cells}/uL (ref 200–950)
Basophils Absolute: 40 {cells}/uL (ref 0–200)
Basophils Relative: 0.8 %
Eosinophils Absolute: 220 {cells}/uL (ref 15–500)
Eosinophils Relative: 4.4 %
HCT: 43.6 % (ref 35.0–45.0)
Hemoglobin: 14.4 g/dL (ref 11.7–15.5)
MCH: 33.7 pg — ABNORMAL HIGH (ref 27.0–33.0)
MCHC: 33 g/dL (ref 32.0–36.0)
MCV: 102.1 fL — ABNORMAL HIGH (ref 80.0–100.0)
MPV: 11.6 fL (ref 7.5–12.5)
Monocytes Relative: 7.4 %
Neutro Abs: 3365 {cells}/uL (ref 1500–7800)
Neutrophils Relative %: 67.3 %
Platelets: 100 10*3/uL — ABNORMAL LOW (ref 140–400)
RBC: 4.27 10*6/uL (ref 3.80–5.10)
RDW: 11.8 % (ref 11.0–15.0)
Total Lymphocyte: 20.1 %
WBC: 5 10*3/uL (ref 3.8–10.8)

## 2023-09-21 LAB — LIPID PANEL
Cholesterol: 122 mg/dL (ref <200)
HDL: 63 mg/dL (ref >=50)
LDL Cholesterol (Calc): 45 mg/dL
Non-HDL Cholesterol (Calc): 59 mg/dL (ref <130)
Total CHOL/HDL Ratio: 1.9 (calc) (ref <5.0)
Triglycerides: 59 mg/dL (ref <150)

## 2023-09-21 LAB — TSH: TSH: 1.44 m[IU]/L (ref 0.40–4.50)

## 2023-09-27 ENCOUNTER — Other Ambulatory Visit: Payer: Self-pay

## 2023-09-27 DIAGNOSIS — E876 Hypokalemia: Secondary | ICD-10-CM

## 2023-09-27 DIAGNOSIS — R7989 Other specified abnormal findings of blood chemistry: Secondary | ICD-10-CM

## 2023-09-27 MED ORDER — ENSURE HIGH PROTEIN PO LIQD: 1.0000 | Freq: Every day | ORAL | Status: AC

## 2023-09-27 MED ORDER — VITAMIN B-12 1000 MCG PO TABS
1000.0000 ug | ORAL_TABLET | Freq: Every day | ORAL | Status: AC
Start: 2023-09-27 — End: ?

## 2023-09-30 ENCOUNTER — Ambulatory Visit: Payer: PPO | Admitting: Surgical

## 2023-09-30 ENCOUNTER — Encounter: Payer: Self-pay | Admitting: Orthopedic Surgery

## 2023-09-30 ENCOUNTER — Other Ambulatory Visit: Payer: Self-pay

## 2023-09-30 ENCOUNTER — Ambulatory Visit: Payer: HMO | Admitting: Orthopedic Surgery

## 2023-09-30 DIAGNOSIS — M25571 Pain in right ankle and joints of right foot: Secondary | ICD-10-CM | POA: Diagnosis not present

## 2023-09-30 DIAGNOSIS — M25572 Pain in left ankle and joints of left foot: Secondary | ICD-10-CM

## 2023-09-30 NOTE — Progress Notes (Signed)
 Office Visit Note   Patient: Jill Preston           Date of Birth: 1933/01/22           MRN: [id number] Visit Date: 09/30/2023 Requested by: Leonarda Roxan BROCKS, NP [address] [place],  [place] [address] PCP: Leonarda Roxan BROCKS, NP  Subjective: Chief Complaint  Patient presents with   Other     Bilateral ankle pain and swelling    HPI: KEM PARCHER is a 89 y.o. female who presents to the office reporting bilateral foot and ankle pain right worse than left.  She has a history of tendon surgery on the right-hand side many years ago.  Also has a history of back surgery with Dr. Mavis done several years ago.  Describes chronic pain in the leg with some radicular component.  She also reports some swelling in the right leg which she states has been there for years.  She reports some toe numbness as well as well as decreased balance.  She states she has a lot of low back pain.  She moves around with a walker.  She uses flexion rub cream which helps her..                ROS: All systems reviewed are negative as they relate to the chief complaint within the history of present illness.  Patient denies fevers or chills.  Assessment & Plan: Visit Diagnoses:  1. Bilateral ankle pain, unspecified chronicity     Plan: Impression is mild arthritis in both ankles with longstanding slight lean more swelling in the right leg than left but no calf tenderness and no Homans.  She has pretty reasonable hip flexion strength.  I think she may be getting some neuropathy as well as referred pain from radiculopathy in her back.  Overall all the ankle tendons are functional and intact and she has very good pulses.  Talked about physical therapy but she wants to hold off on that for now.  She will follow-up as needed.  Follow-Up Instructions: No follow-ups on file.   Orders:  Orders Placed This Encounter  Procedures   XR Ankle Complete Right   XR Ankle Complete Left   No orders of the defined types  were placed in this encounter.     Procedures: No procedures performed   Clinical Data: No additional findings.  Objective: Vital Signs: There were no vitals taken for this visit.  Physical Exam:  Constitutional: Patient appears well-developed HEENT:  Head: Normocephalic Eyes:EOM are normal Neck: Normal range of motion Cardiovascular: Normal rate Pulmonary/chest: Effort normal Neurologic: Patient is alert Skin: Skin is warm Psychiatric: Patient has normal mood and affect  Ortho Exam: Exam demonstrates palpable pedal pulses with slight heel cord contractures on both feet but no plantar ulcers or calluses present.  She has mildly decreased sensation in the right foot compared to the left but that is on the dorsal and plantar aspect.  She does have protective sensation bilaterally.  Palpable intact nontender anterior to posterior to peroneal and Achilles tendons bilaterally.  Very good eversion and inversion and plantarflexion strength.  Dorsiflexion strength also symmetric.  Well-healed surgical incision over the peroneal tendons.  No calf tenderness negative Homans today.  No muscle wasting or atrophy.  Specialty Comments:  No specialty comments available.  Imaging: No results found.   PMFS History: Patient Active Problem List   Diagnosis Date Noted   Concussion 11/13/2022   Head injury due to  trauma 11/12/2022   Fall 10/25/2022   Hypokalemia 03/08/2022   Sick sinus syndrome (HCC) 03/07/2022   Hordeolum externum (stye) 05/16/2021   Chronic otitis media with serous effusion 05/16/2021   Thrombocytopenia (HCC) 04/17/2021   PAD (peripheral artery disease) (HCC) 08/01/2018   Herniated intervertebral disc of lumbar spine 06/02/2018   Bilateral leg pain 01/27/2017   Cervical spine degeneration 10/23/2015   Hypersomnolence 10/23/2015   S/P IVC filter 02/15/2015   Angina pectoris (HCC) 01/26/2015   CAD in native artery    Ischemic chest pain (HCC)    Edema of left lower  extremity 09/16/2014   Acute deep vein thrombosis of left lower extremity (HCC) 09/14/2014   Anemia associated with acute blood loss 09/14/2014   Subclavian artery stenosis, left (HCC) 08/04/2014   Chronic diastolic CHF (congestive heart failure) (HCC) 07/16/2014   Essential hypertension 07/08/2014   CKD (chronic kidney disease), stage III (HCC)    Hypertensive heart disease    Chronic venous insufficiency    Metatarsal deformity 06/24/2013   CAD - LAD stent 12/00. Patent '02 and 1/13, moderate D2 managed medically 02/26/2013   Dyslipidemia 02/26/2013   DJD (degenerative joint disease)- back 02/26/2013   Asthma 08/06/2012   Hyperlipidemia 08/05/2012   Lichen sclerosus 06/03/2012   Rectocele 06/03/2012   Recurrent UTI 06/03/2012   Urge urinary incontinence 06/03/2012   Voiding dysfunction 06/03/2012   Hypothyroidism 07/23/2011   Past Medical History:  Diagnosis Date   Arthritis    right knee   Cancer (HCC) yrs ago   skin cancer removed from face   Chronic diastolic CHF (congestive heart failure) (HCC)    Chronic venous insufficiency    LEA VENOUS, 10/17/2011 - mild reflux in bilateral common femoral veins   CKD (chronic kidney disease), stage III (HCC)    Coronary artery disease    a. s/p PCI/BMS to prox LAD and balloon angioplasty to mLAD with suboptimal result in 2000. b. Abnl nuc 2012, cath 09/2011 - showed that the overall territory of potential ischemia was small and attributable to a moderately diseased small second diagonal artery. Med rx.   Dyspnea    with activity   Headache    History of blood transfusion 2017   Hyperlipidemia    Hypertension    Hypertensive heart disease    Hypothyroidism    Obesity    Pneumonia    several times   Stroke [facility])    pt. states she had light stroke in sept. 1980   Urinary incontinence     Family History  Problem Relation Age of Onset   Cancer Mother    Heart disease Father    Heart disease Sister    High blood pressure  Sister    Heart disease Sister    Cancer Sister    Heart disease Brother    Diabetes Brother    Brain cancer Brother    High blood pressure Brother    Heart disease Brother    Heart attack Brother    Heart disease Brother    High blood pressure Brother     Past Surgical History:  Procedure Laterality Date   ABDOMINAL HYSTERECTOMY  1970's   complete   ANKLE ARTHROSCOPY Right 07/10/2017   Procedure: ANKLE ARTHROSCOPY;  Surgeon: Gretel Ozell PARAS, DPM;  Location: [facility] OR;  Service: Podiatry;  Laterality: Right;   BACK SURGERY  07/26/2016   cervical neck surgery,  surgical center   BALLOON DILATION N/A 12/01/2021   Procedure: BALLOON DILATION;  Surgeon: Rollin Dover, MD;  Location: [facility];  Service: Gastroenterology;  Laterality: N/A;   BLADDER SURGERY  2010   WITH MESH  bladder tach   bunion removal surgery Bilateral 15 yrs ago   CARDIAC CATHETERIZATION Left 09/25/2011   Medical management   CARDIAC CATHETERIZATION Left 03/25/2001   Normal LV function, LAD residual narrowing of less than 10%, normal ramus intermediate, circumflex, and RCA,    CARDIAC CATHETERIZATION  09/04/1999   LAD, 3x42mm Tetra stent resulting in a reduction of the 80% stenosis to 0% residual   CARDIAC CATHETERIZATION N/A 01/26/2015   Procedure: Right/Left Heart Cath and Coronary Angiography;  Surgeon: Debby DELENA Sor, MD;  Location: [facility]AS[facility];  Service: Cardiovascular;  Laterality: N/A;   CARDIAC CATHETERIZATION N/A 01/27/2015   Procedure: Intravascular Pressure Wire/FFR Study;  Surgeon: Lonni JONETTA Cash, MD;  Location: [facility] INVASIVE CV LAB;  Service: Cardiovascular;  Laterality: N/A;   CARDIAC CATHETERIZATION N/A 01/27/2015   Procedure: Right Heart Cath;  Surgeon: Lonni JONETTA Cash, MD;  Location: [facility] INVASIVE CV LAB;  Service: Cardiovascular;  Laterality: N/A;   CHOLECYSTECTOMY     COLONOSCOPY WITH PROPOFOL  N/A 03/07/2017   Procedure: COLONOSCOPY WITH PROPOFOL ;  Surgeon: Kristie Lamprey, MD;   Location: [facility];  Service: Endoscopy;  Laterality: N/A;   CORONARY PRESSURE/FFR STUDY N/A 04/05/2017   Procedure: Intravascular Pressure Wire/FFR Study;  Surgeon: Jordan, Peter M, MD;  Location: [facility] INVASIVE CV LAB;  Service: Cardiovascular;  Laterality: N/A;   CORONARY STENT PLACEMENT     LAD x 1   ESOPHAGOGASTRODUODENOSCOPY (EGD) WITH PROPOFOL  N/A 12/01/2021   Procedure: ESOPHAGOGASTRODUODENOSCOPY (EGD) WITH PROPOFOL ;  Surgeon: Rollin Dover, MD;  Location: [facility];  Service: Gastroenterology;  Laterality: N/A;   ganglion cyst removal  yrs ago   x 2   ILIAC VEIN ANGIOPLASTY / STENTING  02/15/2015   IVC FILTER INSERTION  2016   IVC FILTER REMOVAL  02/15/2015   at [facility]   LEFT HEART CATH AND CORONARY ANGIOGRAPHY N/A 04/05/2017   Procedure: Left Heart Cath and Coronary Angiography;  Surgeon: Jordan, Peter M, MD;  Location: [facility] INVASIVE CV LAB;  Service: Cardiovascular;  Laterality: N/A;   LEFT HEART CATHETERIZATION WITH CORONARY ANGIOGRAM N/A 09/25/2011   Procedure: LEFT HEART CATHETERIZATION WITH CORONARY ANGIOGRAM;  Surgeon: Jerel Balding, MD;  Location: [facility] CATH LAB;  Service: Cardiovascular;  Laterality: N/A;   multiple bladder surgeries to remove mesh     ROTATOR CUFF REPAIR Right    stent to groin Left 08/2014   left leg   TENDON REPAIR Right 07/10/2017   Procedure: RIGHT PERONEAL TENDON REPAIR;  Surgeon: Gretel Ozell PARAS, DPM;  Location: [facility] OR;  Service: Podiatry;  Laterality: Right;   TENDON REPAIR Right 05/27/2019   Procedure: PERONEAL TENDON REPAIR x2;  Surgeon: Gretel Ozell PARAS, DPM;  Location: [facility];  Service: Podiatry;  Laterality: Right;   Social History   Occupational History   Not on file  Tobacco Use   Smoking status: Never   Smokeless tobacco: Never  Vaping Use   Vaping status: Never Used  Substance and Sexual Activity   Alcohol use: No   Drug use: No   Sexual activity: Not on file

## 2023-10-04 ENCOUNTER — Other Ambulatory Visit: Payer: PPO

## 2023-10-04 ENCOUNTER — Other Ambulatory Visit: Payer: Self-pay

## 2023-10-04 DIAGNOSIS — E876 Hypokalemia: Secondary | ICD-10-CM

## 2023-10-05 LAB — BASIC METABOLIC PANEL WITH GFR
BUN: 16 mg/dL (ref 7–25)
CO2: 31 mmol/L (ref 20–32)
Calcium: 8.9 mg/dL (ref 8.6–10.4)
Chloride: 100 mmol/L (ref 98–110)
Creat: 0.84 mg/dL (ref 0.60–0.95)
Glucose, Bld: 89 mg/dL (ref 65–139)
Potassium: 3.4 mmol/L — ABNORMAL LOW (ref 3.5–5.3)
Sodium: 140 mmol/L (ref 135–146)
eGFR: 67 mL/min/{1.73_m2} (ref >=60)

## 2023-10-07 ENCOUNTER — Other Ambulatory Visit: Payer: Self-pay

## 2023-10-07 DIAGNOSIS — I5032 Chronic diastolic (congestive) heart failure: Secondary | ICD-10-CM

## 2023-11-08 ENCOUNTER — Telehealth: Payer: PPO | Admitting: Nurse Practitioner

## 2023-11-08 ENCOUNTER — Encounter: Payer: Self-pay | Admitting: Nurse Practitioner

## 2023-11-08 DIAGNOSIS — R0981 Nasal congestion: Secondary | ICD-10-CM

## 2023-11-08 DIAGNOSIS — R5383 Other fatigue: Secondary | ICD-10-CM | POA: Diagnosis not present

## 2023-11-08 NOTE — Progress Notes (Signed)
Careteam: Patient Care Team: [name], [name], NP as PCP - General (Family Medicine) Croitoru, Rachelle Hora, MD as PCP - Cardiology (Cardiology) Croitoru, Rachelle Hora, MD as Consulting Physician (Cardiology) Cherlyn Roberts, MD as Referring Physician (Dermatology) Park Liter, DPM (Inactive) as Consulting Physician (Podiatry) Burundi, [name], OD (Optometry) Jerrell Mylar, MD as Referring Physician (Gynecology)  Advanced Directive information    Allergies  Allergen Reactions   Atorvastatin Anaphylaxis and Other (See Comments)    Patient does not remember; caused pneumonia- took her off of it per patient   Penicillins Anaphylaxis, Hives, Swelling and Other (See Comments)    Tongue swelling Did it involve swelling of the face/tongue/throat, SOB, or low BP? Yes Did it involve sudden or severe rash/hives, skin peeling, or any reaction on the inside of your mouth or nose? Yes Did you need to seek medical attention at a hospital or doctor's office? Yes When did it last happen?  47 or 89 years old    If all above answers are "NO", may proceed with cephalosporin use.    Shellfish Allergy Anaphylaxis, Nausea And Vomiting and Other (See Comments)    Severe nausea and vomiting   Aminophylline Itching, Swelling, Rash and Other (See Comments)    Pt experienced burning urination, itching, redness/rash, and swelling of genitals after given this medication via IV push.   Iodinated Contrast Media Swelling and Other (See Comments)    FLUSHING, also; LLE became swollen   Latex Other (See Comments)    Causes blisters   Oxybutynin Chloride Other (See Comments)    "blisters"   Propoxyphene Other (See Comments)    Unknown reaction   Vancomycin Hives and Other (See Comments)    06/02/2018- Hives arm, back and chest   Adhesive [Tape] Rash and Other (See Comments)    Paper tape is ok   Betadine [Povidone Iodine] Rash   Ciprofloxacin Hives   Codeine Nausea And Vomiting    .   Ranolazine Other  (See Comments)    Constipation  .    Chief Complaint  Patient presents with   URI    Possible URI     HPI: Patient is a 89 y.o. female  Discussed the use of AI scribe software for clinical note transcription with the patient, who gave verbal consent to proceed.  History of Present Illness   Jill Preston is an 89 year old female who presents with sinus congestion and fatigue.   She has been experiencing severe sinus congestion for at least a week, with drainage between her eyes, nose, and ears, and constant postnasal drip. She frequently spits out congestion but has no chest congestion, cough, or fever. No headaches or dizziness are reported. She has not used nasal rinses or washes and is not taking any medications for these symptoms.  She reports significant fatigue, feeling overtired and wanting to sleep constantly, persisting for about a month. This fatigue may be related to her caregiving responsibilities for her husband, who has dementia and often wakes up in the middle of the night.  She is HOH Review of Systems:  Review of Systems  Constitutional:  Positive for malaise/fatigue. Negative for chills, fever and weight loss.  HENT:  Positive for congestion and sore throat. Negative for tinnitus.   Respiratory:  Positive for cough. Negative for sputum production and shortness of breath.   Cardiovascular:  Negative for chest pain, palpitations and leg swelling.  Gastrointestinal:  Negative for abdominal pain, constipation, diarrhea and heartburn.  Genitourinary:  Negative for dysuria, frequency and urgency.  Musculoskeletal:  Positive for falls. Negative for back pain, joint pain and myalgias.  Skin: Negative.   Psychiatric/Behavioral:  Negative for depression and memory loss. The patient does not have insomnia.     Past Medical History:  Diagnosis Date   Arthritis    right knee   Cancer (HCC) yrs ago   skin cancer removed from face   Chronic diastolic CHF (congestive  heart failure) (HCC)    Chronic venous insufficiency    LEA VENOUS, 10/17/2011 - mild reflux in bilateral common femoral veins   CKD (chronic kidney disease), stage III (HCC)    Coronary artery disease    a. s/p PCI/BMS to prox LAD and balloon angioplasty to mLAD with suboptimal result in 2000. b. Abnl nuc 2012, cath 09/2011 - showed that the overall territory of potential ischemia was small and attributable to a moderately diseased small second diagonal artery. Med rx.   Dyspnea    with activity   Headache    History of blood transfusion 2017   Hyperlipidemia    Hypertension    Hypertensive heart disease    Hypothyroidism    Obesity    Pneumonia    several times   Stroke [facility])    pt. states she had "light stroke" in sept. 1980   Urinary incontinence    Past Surgical History:  Procedure Laterality Date   ABDOMINAL HYSTERECTOMY  1970's   complete   ANKLE ARTHROSCOPY Right 07/10/2017   Procedure: ANKLE ARTHROSCOPY;  Surgeon: Park Liter, DPM;  Location: [facility] OR;  Service: Podiatry;  Laterality: Right;   BACK SURGERY  07/26/2016   cervical neck surgery, [facility]   BALLOON DILATION N/A 12/01/2021   Procedure: BALLOON DILATION;  Surgeon: Jeani Hawking, MD;  Location: [facility] ENDOSCOPY;  Service: Gastroenterology;  Laterality: N/A;   BLADDER SURGERY  2010   WITH MESH  bladder tach   bunion removal surgery Bilateral 15 yrs ago   CARDIAC CATHETERIZATION Left 09/25/2011   Medical management   CARDIAC CATHETERIZATION Left 03/25/2001   Normal LV function, LAD residual narrowing of less than 10%, normal ramus intermediate, circumflex, and RCA,    CARDIAC CATHETERIZATION  09/04/1999   LAD, 3x58mm Tetra stent resulting in a reduction of the 80% stenosis to 0% residual   CARDIAC CATHETERIZATION N/A 01/26/2015   Procedure: Right/Left Heart Cath and Coronary Angiography;  Surgeon: Lennette Bihari, MD;  Location: [facility] INVASIVE CV LAB;  Service: Cardiovascular;  Laterality: N/A;   CARDIAC  CATHETERIZATION N/A 01/27/2015   Procedure: Intravascular Pressure Wire/FFR Study;  Surgeon: Kathleene Hazel, MD;  Location: [facility] INVASIVE CV LAB;  Service: Cardiovascular;  Laterality: N/A;   CARDIAC CATHETERIZATION N/A 01/27/2015   Procedure: Right Heart Cath;  Surgeon: Kathleene Hazel, MD;  Location: [facility] INVASIVE CV LAB;  Service: Cardiovascular;  Laterality: N/A;   CHOLECYSTECTOMY     COLONOSCOPY WITH PROPOFOL N/A 03/07/2017   Procedure: COLONOSCOPY WITH PROPOFOL;  Surgeon: Charna Elizabeth, MD;  Location: [facility];  Service: Endoscopy;  Laterality: N/A;   CORONARY PRESSURE/FFR STUDY N/A 04/05/2017   Procedure: Intravascular Pressure Wire/FFR Study;  Surgeon: Swaziland, Peter M, MD;  Location: [facility] INVASIVE CV LAB;  Service: Cardiovascular;  Laterality: N/A;   CORONARY STENT PLACEMENT     LAD x 1   ESOPHAGOGASTRODUODENOSCOPY (EGD) WITH PROPOFOL N/A 12/01/2021   Procedure: ESOPHAGOGASTRODUODENOSCOPY (EGD) WITH PROPOFOL;  Surgeon: Jeani Hawking, MD;  Location: [facility];  Service: Gastroenterology;  Laterality: N/A;  ganglion cyst removal  yrs ago   x 2   ILIAC VEIN ANGIOPLASTY / STENTING  02/15/2015   IVC FILTER INSERTION  2016   IVC FILTER REMOVAL  02/15/2015   at [facility]   LEFT HEART CATH AND CORONARY ANGIOGRAPHY N/A 04/05/2017   Procedure: Left Heart Cath and Coronary Angiography;  Surgeon: Swaziland, Peter M, MD;  Location: [facility] INVASIVE CV LAB;  Service: Cardiovascular;  Laterality: N/A;   LEFT HEART CATHETERIZATION WITH CORONARY ANGIOGRAM N/A 09/25/2011   Procedure: LEFT HEART CATHETERIZATION WITH CORONARY ANGIOGRAM;  Surgeon: Thurmon Fair, MD;  Location: [facility] CATH LAB;  Service: Cardiovascular;  Laterality: N/A;   multiple bladder surgeries to remove mesh     ROTATOR CUFF REPAIR Right    stent to groin Left 08/2014   left leg   TENDON REPAIR Right 07/10/2017   Procedure: RIGHT PERONEAL TENDON REPAIR;  Surgeon: Park Liter, DPM;  Location: [facility] OR;  Service: Podiatry;  Laterality:  Right;   TENDON REPAIR Right 05/27/2019   Procedure: PERONEAL TENDON REPAIR x2;  Surgeon: Park Liter, DPM;  Location: [facility] ORS;  Service: Podiatry;  Laterality: Right;   Social History:   reports that she has never smoked. She has never used smokeless tobacco. She reports that she does not drink alcohol and does not use drugs.  Family History  Problem Relation Age of Onset   Cancer Mother    Heart disease Father    Heart disease Sister    High blood pressure Sister    Heart disease Sister    Cancer Sister    Heart disease Brother    Diabetes Brother    Brain cancer Brother    High blood pressure Brother    Heart disease Brother    Heart attack Brother    Heart disease Brother    High blood pressure Brother     Medications: Patient's Medications  New Prescriptions   No medications on file  Previous Medications   AMLODIPINE (NORVASC) 2.5 MG TABLET    Take 1 tablet (2.5 mg total) by mouth daily.   CYANOCOBALAMIN (VITAMIN B12) 1000 MCG TABLET    Take 1 tablet (1,000 mcg total) by mouth daily.   EZETIMIBE (ZETIA) 10 MG TABLET    Take 1 tablet (10 mg total) by mouth every morning.   GABAPENTIN (NEURONTIN) 300 MG CAPSULE    Take 1 capsule (300 mg total) by mouth at bedtime.   ISOSORBIDE MONONITRATE (IMDUR) 30 MG 24 HR TABLET    Take 1 tablet (30 mg total) by mouth daily.   LATANOPROST (XALATAN) 0.005 % OPHTHALMIC SOLUTION    Place 1 drop into both eyes at bedtime.   LEVOTHYROXINE (SYNTHROID) 75 MCG TABLET    Take 1 tablet (75 mcg total) by mouth daily before breakfast.   MECLIZINE (ANTIVERT) 12.5 MG TABLET    TAKE 1 TABLET(12.5 MG) BY MOUTH THREE TIMES DAILY AS NEEDED FOR DIZZINESS   NUTRITIONAL SUPPLEMENTS (ENSURE HIGH PROTEIN) LIQD    Take 1 Can by mouth daily.   NYSTATIN CREAM (MYCOSTATIN)    APPLY 1 APPLICATION TOPICALLY TWICE DAILY *REFILL REQUEST*   OVER THE COUNTER MEDICATION    Take 1 capsule by mouth daily. OmegaXL joint and muscle support   POTASSIUM CHLORIDE SA  (KLOR-CON M) 20 MEQ TABLET    Take 40 mEq by mouth 2 (two) times daily.   PRAVASTATIN (PRAVACHOL) 80 MG TABLET    Take 1 tablet (80 mg total) by mouth at bedtime.  SERTRALINE (ZOLOFT) 25 MG TABLET    Take 1 tablet (25 mg total) by mouth daily.   TORSEMIDE (DEMADEX) 100 MG TABLET    Take 1/2 tablet ( 50 mg ) twice a day   TYLENOL 8 HOUR ARTHRITIS PAIN 650 MG CR TABLET    Take 650 mg by mouth every 8 (eight) hours as needed for pain.  Modified Medications   No medications on file  Discontinued Medications   No medications on file    Physical Exam:  There were no vitals filed for this visit. There is no height or weight on file to calculate BMI. Wt Readings from Last 3 Encounters:  09/20/23 184 lb 3.2 oz (83.6 kg)  08/08/23 180 lb (81.6 kg)  07/17/23 183 lb 6.4 oz (83.2 kg)    Physical Exam Constitutional:      Appearance: Normal appearance.  Pulmonary:     Effort: Pulmonary effort is normal.  Neurological:     Mental Status: She is alert. Mental status is at baseline.  Psychiatric:        Mood and Affect: Mood normal.     Labs reviewed: Basic Metabolic Panel: Recent Labs    11/15/22 0453 12/11/22 1459 03/04/23 1448 04/23/23 1125 05/14/23 1102 06/30/23 1501 09/20/23 1118 10/04/23 0933  NA 137   < >  --   --    < > 140 143 140  K 3.8   < >  --   --    < > 4.7 3.2* 3.4*  CL 100   < >  --   --    < > 100 101 100  CO2 30   < >  --   --    < > 29 34* 31  GLUCOSE 98   < >  --   --    < > 88 102 89  BUN 15   < >  --   --    < > 17 18 16   CREATININE 0.74   < >  --   --    < > 0.79 0.94 0.84  CALCIUM 8.5*   < >  --   --    < > 9.3 9.0 8.9  MG 2.0  --   --   --   --   --   --   --   TSH  --   --  0.20* 0.61  --   --  1.44  --    < > = values in this interval not displayed.   Liver Function Tests: Recent Labs    11/13/22 0624 05/28/23 1029 06/04/23 1027 06/30/23 1501 09/20/23 1118  AST 30   < > 32 42* 32  ALT 16   < > 20 23 17   ALKPHOS 71  --  119 91  --    BILITOT 1.2   < > 1.3* 1.3* 1.2  PROT 5.7*   < > 6.2* 5.7* 5.7*  ALBUMIN 2.8*  --  3.4* 3.2*  --    < > = values in this interval not displayed.   No results for input(s): "LIPASE", "AMYLASE" in the last 8760 hours. No results for input(s): "AMMONIA" in the last 8760 hours. CBC: Recent Labs    06/04/23 1027 06/30/23 1501 09/20/23 1118  WBC 4.8 4.4 5.0  NEUTROABS 3.2 3.0 3,365  HGB 14.4 14.6 14.4  HCT 43.6 45.1 43.6  MCV 101.6* 103.4* 102.1*  PLT 75* 78* 100*   Lipid Panel: Recent Labs  02/04/23 1156 09/20/23 1118  CHOL 125 122  HDL 64 63  LDLCALC 44 45  TRIG 90 59  CHOLHDL 2.0 1.9   TSH: Recent Labs    03/04/23 1448 04/23/23 1125 09/20/23 1118  TSH 0.20* 0.61 1.44   A1C: Lab Results  Component Value Date   HGBA1C 6.1 (H) 01/26/2015     Assessment/Plan Assessment and Plan    Sinus congestion  Chronic sinus congestion with postnasal drip for approximately one month. No fever, chills, or chest congestion. Fatigue reported, possibly related to chronic symptoms and caregiving responsibilities. -Start over-the-counter Claritin 10mg  daily for postnasal drip. -Start over-the-counter Flonase nasal spray, one squirt in each nostril twice daily. -If symptoms do not improve, schedule an in-office appointment for further evaluation.  Fatigue Chronic fatigue reported, possibly related to chronic upper respiratory symptoms and caregiving responsibilities for a spouse with dementia. -Continue to monitor symptoms. -Consider seeking additional support for caregiving responsibilities if fatigue persists or worsens.    Janene Harvey. Biagio Borg  [facility] [phone]    Virtual Visit via video  I connected with patient on 11/08/23 at  1:20 PM EST by mychart and verified that I am speaking with the correct person using two identifiers.  Location: Patient: home Provider: [facility]   I discussed the limitations, risks, security and privacy  concerns of performing an evaluation and management service by telephone and the availability of in person appointments. I also discussed with the patient that there may be a patient responsible charge related to this service. The patient expressed understanding and agreed to proceed.   I discussed the assessment and treatment plan with the patient. The patient was provided an opportunity to ask questions and all were answered. The patient agreed with the plan and demonstrated an understanding of the instructions.   The patient was advised to call back or seek an in-person evaluation if the symptoms worsen or if the condition fails to improve as anticipated.  I provided 15 minutes of non-face-to-face time during this encounter.  Janene Harvey. Biagio Borg Avs printed and mailed

## 2023-11-08 NOTE — Patient Instructions (Addendum)
To take Loratadine or cetrizine (generic for Claritin or zyrtec) 10 mg by mouth daily for allergies/post nasal drip   Start flonase 1 spray into both nares twice daily as needed congestion  Nasal saline throughout the day as needed

## 2023-11-08 NOTE — Progress Notes (Signed)
This service is provided via telemedicine  No vital signs collected/recorded due to the encounter was a telemedicine visit.   Location of patient (ex: home, work):  Home   Patient consents to a telephone visit:  Yes, 11/08/23   Location of the provider (ex: office, home):  [facility]  Name of any referring provider:  [name], [name], NP   Names of all persons participating in the telemedicine service and their role in the encounter: Jethro Radke B/CMA, Sharon Seller NP,  and patient  Time spent on call:  11 minutes

## 2023-11-08 NOTE — Progress Notes (Signed)
I connected with  Jill Preston on 11/08/23 by a video enabled telemedicine application and verified that I am speaking with the correct person using two identifiers.   I discussed the limitations of evaluation and management by telemedicine. The patient expressed understanding and agreed to proceed.

## 2023-11-11 ENCOUNTER — Telehealth: Payer: Self-pay | Admitting: Emergency Medicine

## 2023-11-11 NOTE — Telephone Encounter (Signed)
 Patient stated she fell four times over the weekend and would like an appointment

## 2023-11-12 ENCOUNTER — Emergency Department (HOSPITAL_COMMUNITY): Payer: PPO

## 2023-11-12 ENCOUNTER — Inpatient Hospital Stay (HOSPITAL_COMMUNITY)
Admission: EM | Admit: 2023-11-12 | Discharge: 2023-11-16 | DRG: 312 | Disposition: A | Discharge status: Home Health | Payer: PPO | Attending: Internal Medicine | Admitting: Internal Medicine

## 2023-11-12 ENCOUNTER — Other Ambulatory Visit: Payer: Self-pay

## 2023-11-12 ENCOUNTER — Encounter (HOSPITAL_COMMUNITY): Payer: Self-pay

## 2023-11-12 DIAGNOSIS — H8110 Benign paroxysmal vertigo, unspecified ear: Secondary | ICD-10-CM | POA: Diagnosis present

## 2023-11-12 DIAGNOSIS — R29898 Other symptoms and signs involving the musculoskeletal system: Secondary | ICD-10-CM | POA: Diagnosis not present

## 2023-11-12 DIAGNOSIS — E861 Hypovolemia: Secondary | ICD-10-CM | POA: Diagnosis present

## 2023-11-12 DIAGNOSIS — Z66 Do not resuscitate: Secondary | ICD-10-CM | POA: Diagnosis present

## 2023-11-12 DIAGNOSIS — I739 Peripheral vascular disease, unspecified: Secondary | ICD-10-CM | POA: Diagnosis present

## 2023-11-12 DIAGNOSIS — I130 Hypertensive heart and chronic kidney disease with heart failure and stage 1 through stage 4 chronic kidney disease, or unspecified chronic kidney disease: Secondary | ICD-10-CM | POA: Diagnosis present

## 2023-11-12 DIAGNOSIS — E871 Hypo-osmolality and hyponatremia: Secondary | ICD-10-CM | POA: Diagnosis present

## 2023-11-12 DIAGNOSIS — I25118 Atherosclerotic heart disease of native coronary artery with other forms of angina pectoris: Secondary | ICD-10-CM | POA: Diagnosis not present

## 2023-11-12 DIAGNOSIS — R531 Weakness: Principal | ICD-10-CM

## 2023-11-12 DIAGNOSIS — Z881 Allergy status to other antibiotic agents status: Secondary | ICD-10-CM

## 2023-11-12 DIAGNOSIS — Z79890 Hormone replacement therapy (postmenopausal): Secondary | ICD-10-CM

## 2023-11-12 DIAGNOSIS — R296 Repeated falls: Secondary | ICD-10-CM | POA: Diagnosis not present

## 2023-11-12 DIAGNOSIS — E161 Other hypoglycemia: Secondary | ICD-10-CM | POA: Diagnosis not present

## 2023-11-12 DIAGNOSIS — E876 Hypokalemia: Secondary | ICD-10-CM | POA: Diagnosis not present

## 2023-11-12 DIAGNOSIS — Z79899 Other long term (current) drug therapy: Secondary | ICD-10-CM

## 2023-11-12 DIAGNOSIS — Z880 Allergy status to penicillin: Secondary | ICD-10-CM

## 2023-11-12 DIAGNOSIS — Z91013 Allergy to seafood: Secondary | ICD-10-CM

## 2023-11-12 DIAGNOSIS — R42 Dizziness and giddiness: Secondary | ICD-10-CM

## 2023-11-12 DIAGNOSIS — I951 Orthostatic hypotension: Principal | ICD-10-CM | POA: Diagnosis present

## 2023-11-12 DIAGNOSIS — R9431 Abnormal electrocardiogram [ECG] [EKG]: Secondary | ICD-10-CM | POA: Diagnosis present

## 2023-11-12 DIAGNOSIS — Z955 Presence of coronary angioplasty implant and graft: Secondary | ICD-10-CM

## 2023-11-12 DIAGNOSIS — T07XXXA Unspecified multiple injuries, initial encounter: Secondary | ICD-10-CM

## 2023-11-12 DIAGNOSIS — Z91040 Latex allergy status: Secondary | ICD-10-CM

## 2023-11-12 DIAGNOSIS — Z6831 Body mass index (BMI) 31.0-31.9, adult: Secondary | ICD-10-CM

## 2023-11-12 DIAGNOSIS — I10 Essential (primary) hypertension: Secondary | ICD-10-CM | POA: Diagnosis not present

## 2023-11-12 DIAGNOSIS — R410 Disorientation, unspecified: Secondary | ICD-10-CM | POA: Diagnosis not present

## 2023-11-12 DIAGNOSIS — Z885 Allergy status to narcotic agent status: Secondary | ICD-10-CM

## 2023-11-12 DIAGNOSIS — Z8673 Personal history of transient ischemic attack (TIA), and cerebral infarction without residual deficits: Secondary | ICD-10-CM

## 2023-11-12 DIAGNOSIS — W19XXXA Unspecified fall, initial encounter: Secondary | ICD-10-CM | POA: Diagnosis present

## 2023-11-12 DIAGNOSIS — T148XXA Other injury of unspecified body region, initial encounter: Secondary | ICD-10-CM | POA: Diagnosis present

## 2023-11-12 DIAGNOSIS — J45909 Unspecified asthma, uncomplicated: Secondary | ICD-10-CM | POA: Diagnosis present

## 2023-11-12 DIAGNOSIS — Z833 Family history of diabetes mellitus: Secondary | ICD-10-CM

## 2023-11-12 DIAGNOSIS — I5032 Chronic diastolic (congestive) heart failure: Secondary | ICD-10-CM | POA: Diagnosis present

## 2023-11-12 DIAGNOSIS — I672 Cerebral atherosclerosis: Secondary | ICD-10-CM | POA: Diagnosis not present

## 2023-11-12 DIAGNOSIS — Z888 Allergy status to other drugs, medicaments and biological substances status: Secondary | ICD-10-CM

## 2023-11-12 DIAGNOSIS — Z808 Family history of malignant neoplasm of other organs or systems: Secondary | ICD-10-CM

## 2023-11-12 DIAGNOSIS — B9620 Unspecified Escherichia coli [E. coli] as the cause of diseases classified elsewhere: Secondary | ICD-10-CM | POA: Diagnosis present

## 2023-11-12 DIAGNOSIS — N1831 Chronic kidney disease, stage 3a: Secondary | ICD-10-CM | POA: Diagnosis present

## 2023-11-12 DIAGNOSIS — N3000 Acute cystitis without hematuria: Secondary | ICD-10-CM | POA: Diagnosis present

## 2023-11-12 DIAGNOSIS — E860 Dehydration: Secondary | ICD-10-CM | POA: Diagnosis present

## 2023-11-12 DIAGNOSIS — E039 Hypothyroidism, unspecified: Secondary | ICD-10-CM | POA: Diagnosis present

## 2023-11-12 DIAGNOSIS — R7989 Other specified abnormal findings of blood chemistry: Secondary | ICD-10-CM | POA: Diagnosis not present

## 2023-11-12 DIAGNOSIS — E669 Obesity, unspecified: Secondary | ICD-10-CM | POA: Diagnosis present

## 2023-11-12 DIAGNOSIS — I2510 Atherosclerotic heart disease of native coronary artery without angina pectoris: Secondary | ICD-10-CM | POA: Diagnosis present

## 2023-11-12 DIAGNOSIS — D696 Thrombocytopenia, unspecified: Secondary | ICD-10-CM | POA: Diagnosis not present

## 2023-11-12 DIAGNOSIS — Z91041 Radiographic dye allergy status: Secondary | ICD-10-CM

## 2023-11-12 DIAGNOSIS — Z90710 Acquired absence of both cervix and uterus: Secondary | ICD-10-CM

## 2023-11-12 DIAGNOSIS — Z9049 Acquired absence of other specified parts of digestive tract: Secondary | ICD-10-CM

## 2023-11-12 DIAGNOSIS — K7460 Unspecified cirrhosis of liver: Secondary | ICD-10-CM | POA: Diagnosis present

## 2023-11-12 DIAGNOSIS — Z85828 Personal history of other malignant neoplasm of skin: Secondary | ICD-10-CM

## 2023-11-12 DIAGNOSIS — Z8249 Family history of ischemic heart disease and other diseases of the circulatory system: Secondary | ICD-10-CM

## 2023-11-12 DIAGNOSIS — R161 Splenomegaly, not elsewhere classified: Secondary | ICD-10-CM | POA: Diagnosis present

## 2023-11-12 DIAGNOSIS — E785 Hyperlipidemia, unspecified: Secondary | ICD-10-CM | POA: Diagnosis present

## 2023-11-12 LAB — COMPREHENSIVE METABOLIC PANEL
ALT: 36 U/L (ref 0–44)
AST: 92 U/L — ABNORMAL HIGH (ref 15–41)
Albumin: 2.5 g/dL — ABNORMAL LOW (ref 3.5–5.0)
Alkaline Phosphatase: 70 U/L (ref 38–126)
Anion gap: 15 (ref 5–15)
BUN: 21 mg/dL (ref 8–23)
CO2: 29 mmol/L (ref 22–32)
Calcium: 8.5 mg/dL — ABNORMAL LOW (ref 8.9–10.3)
Chloride: 84 mmol/L — ABNORMAL LOW (ref 98–111)
Creatinine, Ser: 1.01 mg/dL — ABNORMAL HIGH (ref 0.44–1.00)
GFR, Estimated: 54 mL/min — ABNORMAL LOW (ref >60)
Glucose, Bld: 106 mg/dL — ABNORMAL HIGH (ref 70–99)
Potassium: 3.1 mmol/L — ABNORMAL LOW (ref 3.5–5.1)
Sodium: 128 mmol/L — ABNORMAL LOW (ref 135–145)
Total Bilirubin: 1.7 mg/dL — ABNORMAL HIGH (ref 0.0–1.2)
Total Protein: 5.5 g/dL — ABNORMAL LOW (ref 6.5–8.1)

## 2023-11-12 LAB — CBC WITH DIFFERENTIAL/PLATELET
Abs Immature Granulocytes: 0.02 10*3/uL (ref 0.00–0.07)
Basophils Absolute: 0 10*3/uL (ref 0.0–0.1)
Basophils Relative: 0 %
Eosinophils Absolute: 0.1 10*3/uL (ref 0.0–0.5)
Eosinophils Relative: 1 %
HCT: 43.6 % (ref 36.0–46.0)
Hemoglobin: 15.1 g/dL — ABNORMAL HIGH (ref 12.0–15.0)
Immature Granulocytes: 0 %
Lymphocytes Relative: 17 %
Lymphs Abs: 0.9 10*3/uL (ref 0.7–4.0)
MCH: 33.3 pg (ref 26.0–34.0)
MCHC: 34.6 g/dL (ref 30.0–36.0)
MCV: 96 fL (ref 80.0–100.0)
Monocytes Absolute: 0.5 10*3/uL (ref 0.1–1.0)
Monocytes Relative: 9 %
Neutro Abs: 3.6 10*3/uL (ref 1.7–7.7)
Neutrophils Relative %: 73 %
Platelets: 56 10*3/uL — ABNORMAL LOW (ref 150–400)
RBC: 4.54 MIL/uL (ref 3.87–5.11)
RDW: 13.3 % (ref 11.5–15.5)
WBC: 5 10*3/uL (ref 4.0–10.5)
nRBC: 0 % (ref 0.0–0.2)

## 2023-11-12 LAB — URINALYSIS, ROUTINE W REFLEX MICROSCOPIC
Glucose, UA: NEGATIVE mg/dL
Ketones, ur: NEGATIVE mg/dL
Nitrite: NEGATIVE
Protein, ur: 100 mg/dL — AB
Specific Gravity, Urine: 1.01 (ref 1.005–1.030)
pH: 6.5 (ref 5.0–8.0)

## 2023-11-12 LAB — TROPONIN I (HIGH SENSITIVITY)
Troponin I (High Sensitivity): 26 ng/L — ABNORMAL HIGH (ref <18)
Troponin I (High Sensitivity): 28 ng/L — ABNORMAL HIGH (ref <18)

## 2023-11-12 LAB — RESP PANEL BY RT-PCR (RSV, FLU A&B, COVID)  RVPGX2
Influenza A by PCR: NEGATIVE
Influenza B by PCR: NEGATIVE
Resp Syncytial Virus by PCR: NEGATIVE
SARS Coronavirus 2 by RT PCR: NEGATIVE

## 2023-11-12 LAB — URINALYSIS, MICROSCOPIC (REFLEX): WBC, UA: >50 WBC/hpf (ref 0–5)

## 2023-11-12 LAB — I-STAT CG4 LACTIC ACID, ED
Lactic Acid, Venous: 1.6 mmol/L (ref 0.5–1.9)
Lactic Acid, Venous: 1.1 mmol/L (ref 0.5–1.9)

## 2023-11-12 LAB — BRAIN NATRIURETIC PEPTIDE: B Natriuretic Peptide: 230.7 pg/mL — ABNORMAL HIGH (ref 0.0–100.0)

## 2023-11-12 MED ORDER — LEVOTHYROXINE SODIUM 75 MCG PO TABS
75.0000 ug | ORAL_TABLET | Freq: Every day | ORAL | Status: DC
  Administered 2023-11-13 – 2023-11-16 (×4): 75 ug via ORAL
  Filled 2023-11-12 (×4): qty 1

## 2023-11-12 MED ORDER — SODIUM CHLORIDE 0.9% FLUSH
3.0000 mL | Freq: Two times a day (BID) | INTRAVENOUS | Status: DC
  Administered 2023-11-13 – 2023-11-16 (×8): 3 mL via INTRAVENOUS

## 2023-11-12 MED ORDER — ACETAMINOPHEN 650 MG RE SUPP: 650.0000 mg | Freq: Four times a day (QID) | RECTAL | Status: DC | PRN

## 2023-11-12 MED ORDER — SODIUM CHLORIDE 0.9 % IV BOLUS
500.0000 mL | Freq: Once | INTRAVENOUS | Status: AC
  Administered 2023-11-12: 500 mL via INTRAVENOUS

## 2023-11-12 MED ORDER — ACETAMINOPHEN 325 MG PO TABS
650.0000 mg | ORAL_TABLET | Freq: Four times a day (QID) | ORAL | Status: DC | PRN
  Administered 2023-11-13 – 2023-11-15 (×4): 650 mg via ORAL
  Filled 2023-11-12 (×4): qty 2

## 2023-11-12 MED ORDER — PRAVASTATIN SODIUM 40 MG PO TABS
80.0000 mg | ORAL_TABLET | Freq: Every day | ORAL | Status: DC
  Administered 2023-11-12 – 2023-11-15 (×4): 80 mg via ORAL
  Filled 2023-11-12 (×4): qty 2

## 2023-11-12 MED ORDER — POTASSIUM CHLORIDE CRYS ER 20 MEQ PO TBCR
40.0000 meq | EXTENDED_RELEASE_TABLET | Freq: Once | ORAL | Status: AC
  Administered 2023-11-12: 40 meq via ORAL
  Filled 2023-11-12: qty 2

## 2023-11-12 MED ORDER — SODIUM CHLORIDE 0.9 % IV SOLN
1.0000 g | Freq: Once | INTRAVENOUS | Status: AC
  Administered 2023-11-12: 1 g via INTRAVENOUS
  Filled 2023-11-12: qty 10

## 2023-11-12 MED ORDER — SODIUM CHLORIDE 0.9 % IV SOLN: 1.0000 g | INTRAVENOUS | Status: DC

## 2023-11-12 MED ORDER — LACTATED RINGERS IV SOLN: INTRAVENOUS | Status: AC

## 2023-11-12 MED ORDER — LATANOPROST 0.005 % OP SOLN
1.0000 [drp] | Freq: Every day | OPHTHALMIC | Status: DC
  Administered 2023-11-13 – 2023-11-15 (×4): 1 [drp] via OPHTHALMIC
  Filled 2023-11-12: qty 2.5

## 2023-11-12 MED ORDER — SENNOSIDES-DOCUSATE SODIUM 8.6-50 MG PO TABS: 1.0000 | ORAL_TABLET | Freq: Every evening | ORAL | Status: DC | PRN

## 2023-11-12 MED ORDER — SERTRALINE HCL 25 MG PO TABS
25.0000 mg | ORAL_TABLET | Freq: Every day | ORAL | Status: DC
  Administered 2023-11-13 – 2023-11-16 (×4): 25 mg via ORAL
  Filled 2023-11-12 (×4): qty 1

## 2023-11-12 NOTE — ED Provider Triage Note (Signed)
 Emergency Medicine Provider Triage Evaluation Note  Jill Preston , a 89 y.o. female  was evaluated in triage.  Pt complains of weakness and falls since weekend. Hot cold episodes past day. Falls due to general weakness of lower extremities, not syncope and not lateralizing weakness. Has headache, no confusion, no anticoagulats  Review of Systems  Positive: weakness Negative: Chest pain or abdominal pain  Physical Exam  BP (!) 91/58   Pulse (!) 58   Temp 98.6 F (37 C) (Oral)   Resp 20   Ht 5\' 5"  (1.651 m)   Wt 77.1 kg   SpO2 96%   BMI 28.29 kg/m  Gen:   Awake, no distress, clear MS Resp:  Normal effort, CTA MSK:   Moves extremities without difficulty, no focal weakness Other:  Pale  Medical Decision Making  Medically screening exam initiated at 2:01 PM.  Appropriate orders placed.  Jill Preston was informed that the remainder of the evaluation will be completed by another provider, this initial triage assessment does not replace that evaluation, and the importance of remaining in the ED until their evaluation is complete.     Arby Barrette, MD 11/12/23 [phone]

## 2023-11-12 NOTE — H&P (Incomplete)
 Marland Kitchen

## 2023-11-12 NOTE — ED Provider Notes (Addendum)
 [facility] AT [facility] Provider Note   CSN: [id number] Arrival date & time: 11/12/23  1345     History  Chief Complaint  Patient presents with   Weakness    Jill Preston is a 89 y.o. female.  Patient presents to the emergency department after having multiple falls over the past 5 days.  Patient's family member reports that patient has fallen 5 times since Saturday.  Patient has multiple bruises all over her body.  Patient reports that she has hit her head.  Patient reports that she has been weak and she feels like her legs are giving out.  Patient had the flu 3 weeks ago and has had decreased appetite since.  Patient has not been drinking a normal amount of fluids.  Patient reports feeling dizzy when she stands up and feels like she is losing her balance.  Patient denies any cough or congestion she is not having any shortness of breath.  Patient denies any chest pain.  She denies headache.  The history is provided by the patient and a caregiver.  Weakness Severity:  Moderate Timing:  Constant Progression:  Worsening Chronicity:  New Relieved by:  Nothing Worsened by:  Nothing Associated symptoms: dizziness   Associated symptoms: no nausea        Home Medications Prior to Admission medications   Medication Sig Start Date End Date Taking? Authorizing Provider  amLODipine (NORVASC) 2.5 MG tablet Take 1 tablet (2.5 mg total) by mouth daily. 08/27/23 11/25/23  Croitoru, Mihai, MD  cyanocobalamin (VITAMIN B12) 1000 MCG tablet Take 1 tablet (1,000 mcg total) by mouth daily. 09/27/23   [name], [name], NP  ezetimibe (ZETIA) 10 MG tablet Take 1 tablet (10 mg total) by mouth every morning. 01/10/23   Croitoru, Mihai, MD  gabapentin (NEURONTIN) 300 MG capsule Take 1 capsule (300 mg total) by mouth at bedtime. 06/25/23   Venita Sheffield, MD  isosorbide mononitrate (IMDUR) 30 MG 24 hr tablet Take 1 tablet (30 mg total) by mouth daily. 04/24/23    Croitoru, Mihai, MD  latanoprost (XALATAN) 0.005 % ophthalmic solution Place 1 drop into both eyes at bedtime. 08/08/23   Medina-Vargas, Monina C, NP  levothyroxine (SYNTHROID) 75 MCG tablet Take 1 tablet (75 mcg total) by mouth daily before breakfast. 06/21/23   Croitoru, Mihai, MD  meclizine (ANTIVERT) 12.5 MG tablet TAKE 1 TABLET(12.5 MG) BY MOUTH THREE TIMES DAILY AS NEEDED FOR DIZZINESS 09/17/23   [name], [name], NP  Nutritional Supplements (ENSURE HIGH PROTEIN) LIQD Take 1 Can by mouth daily. 09/27/23   [name], [name], NP  nystatin cream (MYCOSTATIN) APPLY 1 APPLICATION TOPICALLY TWICE DAILY *REFILL REQUEST* Patient taking differently: 1 Application 2 (two) times daily as needed (itch). 05/21/23   [name], [name], NP  OVER THE COUNTER MEDICATION Take 1 capsule by mouth daily. OmegaXL joint and muscle support    [provider]  potassium chloride SA (KLOR-CON M) 20 MEQ tablet Take 40 mEq by mouth 2 (two) times daily.    [provider]  pravastatin (PRAVACHOL) 80 MG tablet Take 1 tablet (80 mg total) by mouth at bedtime. 08/08/23   Medina-Vargas, Monina C, NP  sertraline (ZOLOFT) 25 MG tablet Take 1 tablet (25 mg total) by mouth daily. 08/27/23   [name], [name], NP  torsemide (DEMADEX) 100 MG tablet Take 1/2 tablet ( 50 mg ) twice a day 08/27/23   Croitoru, Mihai, MD  TYLENOL 8 HOUR ARTHRITIS PAIN 650 MG  CR tablet Take 650 mg by mouth every 8 (eight) hours as needed for pain.    [provider]      Allergies    Atorvastatin, Penicillins, Shellfish allergy, Aminophylline, Iodinated contrast media, Latex, Oxybutynin chloride, Propoxyphene, Vancomycin, Adhesive [tape], Betadine [povidone iodine], Ciprofloxacin, Codeine, and Ranolazine    Review of Systems   Review of Systems  Gastrointestinal:  Negative for nausea.  Neurological:  Positive for dizziness and weakness.  All other systems reviewed and are negative.   Physical Exam Updated Vital Signs BP (!)  130/111   Pulse 64   Temp 98.6 F (37 C) (Tympanic)   Resp 15   Ht 5\' 5"  (1.651 m)   Wt 77.1 kg   SpO2 90%   BMI 28.29 kg/m  Physical Exam Vitals and nursing note reviewed.  Constitutional:      Appearance: She is well-developed.  HENT:     Head: Normocephalic.     Right Ear: Tympanic membrane normal.     Left Ear: Tympanic membrane normal.     Nose: Nose normal.     Mouth/Throat:     Mouth: Mucous membranes are moist.  Eyes:     Pupils: Pupils are equal, round, and reactive to light.  Cardiovascular:     Rate and Rhythm: Normal rate and regular rhythm.  Pulmonary:     Effort: Pulmonary effort is normal.     Breath sounds: Normal breath sounds.  Abdominal:     General: Abdomen is flat. There is no distension.  Musculoskeletal:        General: Normal range of motion.     Cervical back: Normal range of motion.  Skin:    General: Skin is warm.     Comments: Multiple dark bruises scattered legs back abdomen.  Neurological:     General: No focal deficit present.     Mental Status: She is alert and oriented to person, place, and time.  Psychiatric:        Mood and Affect: Mood normal.     ED Results / Procedures / Treatments   Labs (all labs ordered are listed, but only abnormal results are displayed) Labs Reviewed  COMPREHENSIVE METABOLIC PANEL - Abnormal; Notable for the following components:      Result Value   Sodium 128 (*)    Potassium 3.1 (*)    Chloride 84 (*)    Glucose, Bld 106 (*)    Creatinine, Ser 1.01 (*)    Calcium 8.5 (*)    Total Protein 5.5 (*)    Albumin 2.5 (*)    AST 92 (*)    Total Bilirubin 1.7 (*)    GFR, Estimated 54 (*)    All other components within normal limits  BRAIN NATRIURETIC PEPTIDE - Abnormal; Notable for the following components:   B Natriuretic Peptide 230.7 (*)    All other components within normal limits  CBC WITH DIFFERENTIAL/PLATELET - Abnormal; Notable for the following components:   Hemoglobin 15.1 (*)     Platelets 56 (*)    All other components within normal limits  URINALYSIS, ROUTINE W REFLEX MICROSCOPIC - Abnormal; Notable for the following components:   APPearance TURBID (*)    Hgb urine dipstick LARGE (*)    Bilirubin Urine SMALL (*)    Protein, ur 100 (*)    Leukocytes,Ua LARGE (*)    All other components within normal limits  URINALYSIS, MICROSCOPIC (REFLEX) - Abnormal; Notable for the following components:   Bacteria, UA  MANY (*)    All other components within normal limits  TROPONIN I (HIGH SENSITIVITY) - Abnormal; Notable for the following components:   Troponin I (High Sensitivity) 26 (*)    All other components within normal limits  RESP PANEL BY RT-PCR (RSV, FLU A&B, COVID)  [id number]  URINE CULTURE  I-STAT CG4 LACTIC ACID, ED  I-STAT CG4 LACTIC ACID, ED  TROPONIN I (HIGH SENSITIVITY)    EKG EKG Interpretation Date/Time:  Tuesday November 12 2023 18:12:46 EST Ventricular Rate:  70 PR Interval:  175 QRS Duration:  99 QT Interval:  479 QTC Calculation: 517 R Axis:   56  Text Interpretation: Sinus rhythm Atrial premature complex Abnormal R-wave progression, early transition Prolonged QT interval Confirmed by Virgina Norfolk [phone]) on 11/12/2023 6:31:29 PM  Radiology DG Chest 2 View Result Date: 11/12/2023 CLINICAL DATA:  Weakness. EXAM: CHEST - 2 VIEW COMPARISON:  11/12/2022. FINDINGS: Bilateral lung fields are clear. Bilateral costophrenic angles are clear. Stable cardio-mediastinal silhouette. No acute osseous abnormalities. The soft tissues are within normal limits. IMPRESSION: No active cardiopulmonary disease. Electronically Signed   By: Jules Schick M.D.   On: 11/12/2023 16:31   CT Head Wo Contrast Result Date: 11/12/2023 CLINICAL DATA:  Bilateral leg weakness for 1 week, increased falls and dizziness EXAM: CT HEAD WITHOUT CONTRAST TECHNIQUE: Contiguous axial images were obtained from the base of the skull through the vertex without intravenous contrast. RADIATION  DOSE REDUCTION: This exam was performed according to the departmental dose-optimization program which includes automated exposure control, adjustment of the mA and/or kV according to patient size and/or use of iterative reconstruction technique. COMPARISON:  06/30/2023 FINDINGS: Brain: Stable chronic small-vessel ischemic changes throughout the periventricular white matter and basal ganglia. No evidence of acute infarct or hemorrhage. Lateral ventricles and midline structures are grossly unremarkable. There is a stable 7 mm posterior falcine meningioma, without mass effect. No acute extra-axial fluid collections. Vascular: Stable atherosclerosis.  No hyperdense vessel. Skull: Normal. Negative for fracture or focal lesion. Sinuses/Orbits: No acute finding. Other: None. IMPRESSION: 1. No acute intracranial process. 2. Stable chronic small-vessel ischemic changes throughout the white matter and basal ganglia. 3. Stable small left posterior para falcine meningioma measuring 7 mm. No mass effect. Electronically Signed   By: Sharlet Salina M.D.   On: 11/12/2023 16:05    Procedures Procedures    Medications Ordered in ED Medications  sodium chloride 0.9 % bolus 500 mL (500 mLs Intravenous New Bag/Given 11/12/23 1821)    ED Course/ Medical Decision Making/ A&P                                 Medical Decision Making Patient complains of multiple falls.  Patient has multiple bruises all over her body.  Family member reports that she is fallen 5 times since Saturday.  Patient had the flu 3 weeks ago and has been weak since  Amount and/or Complexity of Data Reviewed Independent Historian:     Details: Patient is here with a family member who is supportive. External Data Reviewed: notes.    Details: Primary care notes reviewed Labs: ordered. Decision-making details documented in ED Course.    Details: Labs ordered reviewed and interpreted.  Patient has a sodium of 128 potassium of 3.1.  Patient's  bilirubin is 1.7 BNP is 230. Radiology: ordered and independent interpretation performed. Decision-making details documented in ED Course.    Details: Chest x-ray shows no  acute cardiopulmonary disease. CT head shows no acute intracranial process. ECG/medicine tests: ordered and independent interpretation performed. Decision-making details documented in ED Course.    Details: EKG ordered reviewed and interpreted.  EKG shows ventricular rate of 70 Discussion of management or test interpretation with external provider(s): Hospitalist consulted for admission.  Risk Prescription drug management. Decision regarding hospitalization. Risk Details: Patient given IV fluids 500 cc.  Patient's labs reviewed.  Patient has had a history of low sodium and potassium.  Patient is given 40 mEq of potassium p.o.           Final Clinical Impression(s) / ED Diagnoses Final diagnoses:  Weakness  Multiple falls  Multiple contusions  Hyponatremia  Hypokalemia    Rx / DC Orders ED Discharge Orders     None         Osie Cheeks 11/12/23 2113    Elson Areas, PA-C 11/12/23 2148    Virgina Norfolk, DO 11/12/23 2256

## 2023-11-12 NOTE — ED Triage Notes (Signed)
 Pt BIB EMS from home. C/O bilateral leg weakness for a week. Pt states increased falls and dizziness. Axox4. VSS.

## 2023-11-13 ENCOUNTER — Encounter (HOSPITAL_COMMUNITY): Payer: Self-pay | Admitting: Family Medicine

## 2023-11-13 ENCOUNTER — Observation Stay (HOSPITAL_COMMUNITY): Payer: PPO

## 2023-11-13 DIAGNOSIS — Z955 Presence of coronary angioplasty implant and graft: Secondary | ICD-10-CM | POA: Diagnosis not present

## 2023-11-13 DIAGNOSIS — D696 Thrombocytopenia, unspecified: Secondary | ICD-10-CM | POA: Diagnosis not present

## 2023-11-13 DIAGNOSIS — E785 Hyperlipidemia, unspecified: Secondary | ICD-10-CM | POA: Diagnosis not present

## 2023-11-13 DIAGNOSIS — R42 Dizziness and giddiness: Secondary | ICD-10-CM | POA: Diagnosis not present

## 2023-11-13 DIAGNOSIS — I130 Hypertensive heart and chronic kidney disease with heart failure and stage 1 through stage 4 chronic kidney disease, or unspecified chronic kidney disease: Secondary | ICD-10-CM | POA: Diagnosis not present

## 2023-11-13 DIAGNOSIS — Z8249 Family history of ischemic heart disease and other diseases of the circulatory system: Secondary | ICD-10-CM | POA: Diagnosis not present

## 2023-11-13 DIAGNOSIS — T148XXA Other injury of unspecified body region, initial encounter: Secondary | ICD-10-CM | POA: Diagnosis not present

## 2023-11-13 DIAGNOSIS — R29898 Other symptoms and signs involving the musculoskeletal system: Secondary | ICD-10-CM | POA: Diagnosis not present

## 2023-11-13 DIAGNOSIS — E669 Obesity, unspecified: Secondary | ICD-10-CM | POA: Diagnosis not present

## 2023-11-13 DIAGNOSIS — I951 Orthostatic hypotension: Secondary | ICD-10-CM

## 2023-11-13 DIAGNOSIS — K7460 Unspecified cirrhosis of liver: Secondary | ICD-10-CM | POA: Diagnosis not present

## 2023-11-13 DIAGNOSIS — I2510 Atherosclerotic heart disease of native coronary artery without angina pectoris: Secondary | ICD-10-CM | POA: Diagnosis not present

## 2023-11-13 DIAGNOSIS — I10 Essential (primary) hypertension: Secondary | ICD-10-CM | POA: Diagnosis not present

## 2023-11-13 DIAGNOSIS — W19XXXA Unspecified fall, initial encounter: Secondary | ICD-10-CM | POA: Diagnosis present

## 2023-11-13 DIAGNOSIS — R296 Repeated falls: Secondary | ICD-10-CM | POA: Diagnosis not present

## 2023-11-13 DIAGNOSIS — I5032 Chronic diastolic (congestive) heart failure: Secondary | ICD-10-CM | POA: Diagnosis not present

## 2023-11-13 DIAGNOSIS — E871 Hypo-osmolality and hyponatremia: Secondary | ICD-10-CM | POA: Diagnosis not present

## 2023-11-13 DIAGNOSIS — Z66 Do not resuscitate: Secondary | ICD-10-CM | POA: Diagnosis not present

## 2023-11-13 DIAGNOSIS — Z79890 Hormone replacement therapy (postmenopausal): Secondary | ICD-10-CM | POA: Diagnosis not present

## 2023-11-13 DIAGNOSIS — I25118 Atherosclerotic heart disease of native coronary artery with other forms of angina pectoris: Secondary | ICD-10-CM | POA: Diagnosis not present

## 2023-11-13 DIAGNOSIS — R531 Weakness: Secondary | ICD-10-CM | POA: Diagnosis not present

## 2023-11-13 DIAGNOSIS — N3000 Acute cystitis without hematuria: Secondary | ICD-10-CM | POA: Diagnosis not present

## 2023-11-13 DIAGNOSIS — N1831 Chronic kidney disease, stage 3a: Secondary | ICD-10-CM | POA: Diagnosis not present

## 2023-11-13 DIAGNOSIS — E039 Hypothyroidism, unspecified: Secondary | ICD-10-CM | POA: Diagnosis not present

## 2023-11-13 DIAGNOSIS — R7989 Other specified abnormal findings of blood chemistry: Secondary | ICD-10-CM | POA: Diagnosis not present

## 2023-11-13 DIAGNOSIS — Z833 Family history of diabetes mellitus: Secondary | ICD-10-CM | POA: Diagnosis not present

## 2023-11-13 DIAGNOSIS — E876 Hypokalemia: Secondary | ICD-10-CM | POA: Diagnosis not present

## 2023-11-13 DIAGNOSIS — I672 Cerebral atherosclerosis: Secondary | ICD-10-CM | POA: Diagnosis not present

## 2023-11-13 DIAGNOSIS — Z85828 Personal history of other malignant neoplasm of skin: Secondary | ICD-10-CM | POA: Diagnosis not present

## 2023-11-13 DIAGNOSIS — J45909 Unspecified asthma, uncomplicated: Secondary | ICD-10-CM | POA: Diagnosis not present

## 2023-11-13 DIAGNOSIS — Z79899 Other long term (current) drug therapy: Secondary | ICD-10-CM | POA: Diagnosis not present

## 2023-11-13 DIAGNOSIS — E860 Dehydration: Secondary | ICD-10-CM | POA: Diagnosis not present

## 2023-11-13 LAB — CBC
HCT: 39.9 % (ref 36.0–46.0)
Hemoglobin: 13.8 g/dL (ref 12.0–15.0)
MCH: 33.7 pg (ref 26.0–34.0)
MCHC: 34.6 g/dL (ref 30.0–36.0)
MCV: 97.3 fL (ref 80.0–100.0)
Platelets: 44 10*3/uL — ABNORMAL LOW (ref 150–400)
RBC: 4.1 MIL/uL (ref 3.87–5.11)
RDW: 13.6 % (ref 11.5–15.5)
WBC: 3.5 10*3/uL — ABNORMAL LOW (ref 4.0–10.5)
nRBC: 0 % (ref 0.0–0.2)

## 2023-11-13 LAB — COMPREHENSIVE METABOLIC PANEL
ALT: 28 U/L (ref 0–44)
AST: 60 U/L — ABNORMAL HIGH (ref 15–41)
Albumin: 2.2 g/dL — ABNORMAL LOW (ref 3.5–5.0)
Alkaline Phosphatase: 59 U/L (ref 38–126)
Anion gap: 10 (ref 5–15)
BUN: 21 mg/dL (ref 8–23)
CO2: 30 mmol/L (ref 22–32)
Calcium: 8.2 mg/dL — ABNORMAL LOW (ref 8.9–10.3)
Chloride: 92 mmol/L — ABNORMAL LOW (ref 98–111)
Creatinine, Ser: 0.95 mg/dL (ref 0.44–1.00)
GFR, Estimated: 58 mL/min — ABNORMAL LOW (ref >60)
Glucose, Bld: 106 mg/dL — ABNORMAL HIGH (ref 70–99)
Potassium: 2.7 mmol/L — CL (ref 3.5–5.1)
Sodium: 132 mmol/L — ABNORMAL LOW (ref 135–145)
Total Bilirubin: 0.9 mg/dL (ref 0.0–1.2)
Total Protein: 5 g/dL — ABNORMAL LOW (ref 6.5–8.1)

## 2023-11-13 LAB — ECHOCARDIOGRAM COMPLETE
Area-P 1/2: 2.55 cm2
Height: 65 in
S' Lateral: 3 cm
Weight: 2720 [oz_av]

## 2023-11-13 LAB — MAGNESIUM: Magnesium: 1.6 mg/dL — ABNORMAL LOW (ref 1.7–2.4)

## 2023-11-13 MED ORDER — POTASSIUM CHLORIDE CRYS ER 20 MEQ PO TBCR
40.0000 meq | EXTENDED_RELEASE_TABLET | ORAL | Status: AC
  Administered 2023-11-13 (×3): 40 meq via ORAL
  Filled 2023-11-13 (×3): qty 2

## 2023-11-13 MED ORDER — LACTATED RINGERS IV SOLN: INTRAVENOUS | Status: AC

## 2023-11-13 MED ORDER — VITAMIN B-12 1000 MCG PO TABS
1000.0000 ug | ORAL_TABLET | Freq: Every day | ORAL | Status: DC
  Administered 2023-11-13 – 2023-11-16 (×4): 1000 ug via ORAL
  Filled 2023-11-13 (×4): qty 1

## 2023-11-13 MED ORDER — ENSURE ENLIVE PO LIQD
474.0000 mL | Freq: Two times a day (BID) | ORAL | Status: DC
Start: 2023-11-13 — End: 2023-11-16
  Administered 2023-11-13 – 2023-11-16 (×6): 474 mL via ORAL
  Filled 2023-11-13: qty 474

## 2023-11-13 MED ORDER — MAGNESIUM SULFATE 4 GM/100ML IV SOLN
4.0000 g | Freq: Once | INTRAVENOUS | Status: AC
Start: 2023-11-13 — End: 2023-11-13
  Administered 2023-11-13: 4 g via INTRAVENOUS
  Filled 2023-11-13: qty 100

## 2023-11-13 MED ORDER — SODIUM CHLORIDE 0.9 % IV SOLN
2.0000 g | INTRAVENOUS | Status: DC
  Administered 2023-11-13: 2 g via INTRAVENOUS
  Filled 2023-11-13: qty 20

## 2023-11-13 MED ORDER — EZETIMIBE 10 MG PO TABS
10.0000 mg | ORAL_TABLET | Freq: Every morning | ORAL | Status: DC
  Administered 2023-11-13 – 2023-11-16 (×4): 10 mg via ORAL
  Filled 2023-11-13 (×4): qty 1

## 2023-11-13 MED ORDER — LACTATED RINGERS IV SOLN: INTRAVENOUS | Status: DC

## 2023-11-13 NOTE — Evaluation (Signed)
 Occupational Therapy Evaluation Patient Details Name: Jill Preston MRN: [id number] DOB: 12-18-1932 Today's Date: 11/13/2023   History of Present Illness   Pt is 89 yo presenting to [facility] ED on 2/25 due to general weakness, lightheaded on standing and multiple falls. PMH: HTN, HLD, CAD, Chronic HFpEF, CKD III and hypothyroidism.     Clinical Impressions Pt feeling good, c/o pain to R side of head 7/10 at rest, able to fully participate in therapy. Pt O2 remained above 93-95 throughout session on RA. Pt lives at home with husband who uses a cane, PLOF mod I with RW at home, has PCA M-F 9-4pm for LB dressing, bathing, meals, chores, children assist on weekends. Pt currently close to baseline, able to perform ADLs and mobility with set up/CGA, no LOB, good endurance, slight decreased in BP when sitting, no other symptoms, Pt feeling good, BP improved when standing and performing OOB, O2 93-95% on RA. Pt is eager to return home, states she was receiving HHPT/OT, would like to continue. At this time recommending HHOT as Pt has significant support at home and all DME needed to remain safe/independent, and not too keen on going to a postacute center for therapy. Pt will be seen acutely to monitor consistency with OOB activity, BP had dropped earlier today with PT.      If plan is discharge home, recommend the following:   A little help with walking and/or transfers;A little help with bathing/dressing/bathroom;Assistance with cooking/housework;Assist for transportation;Help with stairs or ramp for entrance     Functional Status Assessment   Patient has had a recent decline in their functional status and demonstrates the ability to make significant improvements in function in a reasonable and predictable amount of time.     Equipment Recommendations   None recommended by OT     Recommendations for Other Services         Precautions/Restrictions   Precautions Precautions:  Fall Restrictions Weight Bearing Restrictions Per Provider Order: No     Mobility Bed Mobility Overal bed mobility: Needs Assistance Bed Mobility: Supine to Sit, Sit to Supine     Supine to sit: Min assist Sit to supine: Contact guard assist   General bed mobility comments: min A to power sitting up, CGA to return to bed    Transfers Overall transfer level: Needs assistance Equipment used: Rolling walker (2 wheels) Transfers: Sit to/from Stand, Bed to chair/wheelchair/BSC Sit to Stand: Contact guard assist     Step pivot transfers: Contact guard assist     General transfer comment: CGA for safety, no LOB      Balance Overall balance assessment: Needs assistance Sitting-balance support: No upper extremity supported, Feet supported Sitting balance-Leahy Scale: Fair Sitting balance - Comments: EOB ADLs   Standing balance support: Bilateral upper extremity supported, During functional activity Standing balance-Leahy Scale: Fair Standing balance comment: able to stand unassisted at sink performing hand hygiene, able to stand bend over toilet for several minutes performing hygiene, no LOB.                           ADL either performed or assessed with clinical judgement   ADL Overall ADL's : Needs assistance/impaired;At baseline Eating/Feeding: Independent   Grooming: Supervision/safety;Standing   Upper Body Bathing: Set up;Sitting   Lower Body Bathing: Moderate assistance;Sitting/lateral leans   Upper Body Dressing : Set up;Sitting   Lower Body Dressing: Moderate assistance;Sit to/from stand   Toilet Transfer: Contact guard assist;Rolling  walker (2 wheels)   Toileting- Clothing Manipulation and Hygiene: Set up;Contact guard assist       Functional mobility during ADLs: Contact guard assist;Rolling walker (2 wheels) General ADL Comments: Pt likely at baseline, set up/CGA for ambulation and ADLs, able to stand unsupported at toilet performing  hygiene, needs help with sock/shoes at baseline from PCA and children.     Vision Baseline Vision/History: 1 Wears glasses Ability to See in Adequate Light: 1 Impaired Patient Visual Report: No change from baseline       Perception         Praxis         Pertinent Vitals/Pain Pain Assessment Pain Assessment: 0-10 Pain Score: 7  Pain Location: pain in the R temporal and L occipital areas. Pt reports hitting her head when she fell. Pain Descriptors / Indicators: Aching Pain Intervention(s): Monitored during session     Extremity/Trunk Assessment Upper Extremity Assessment Upper Extremity Assessment: Overall WFL for tasks assessed       Cervical / Trunk Assessment Cervical / Trunk Assessment: Kyphotic   Communication Communication Communication: Impaired Factors Affecting Communication: Hearing impaired   Cognition Arousal: Alert Behavior During Therapy: WFL for tasks assessed/performed Cognition: No apparent impairments                               Following commands: Intact       Cueing  General Comments   Cueing Techniques: Verbal cues      Exercises     Shoulder Instructions      Home Living Family/patient expects to be discharged to:: Private residence Living Arrangements: Spouse/significant other Available Help at Discharge: Family;Personal care attendant (M-F caretiver 9-4 pm) Type of Home: House Home Access: Stairs to enter;Ramped entrance Entrance Stairs-Number of Steps: 8 Entrance Stairs-Rails: Can reach both Home Layout: One level     Bathroom Shower/Tub: Tub/shower unit;Walk-in shower   Bathroom Toilet: Handicapped height     Home Equipment: Tub bench;Rolling Environmental consultant (2 wheels);Standard Walker;Rollator (4 wheels);Wheelchair - manual;Hand held shower head   Additional Comments: Lives with elderly husband who was just ill, PCA assists M-F 9-4, children assist on weekends.      Prior Functioning/Environment Prior Level  of Function : Independent/Modified Independent;History of Falls (last six months)             Mobility Comments: Pt reports that she uses a RW. Reports 5 falls over the weekend before that no falls in the past year. ADLs Comments: family helps with transportation    OT Problem List: Decreased strength;Impaired balance (sitting and/or standing);Pain   OT Treatment/Interventions: Self-care/ADL training;Therapeutic exercise;Energy conservation;DME and/or AE instruction;Therapeutic activities;Patient/family education;Balance training      OT Goals(Current goals can be found in the care plan section)   Acute Rehab OT Goals Patient Stated Goal: to return home OT Goal Formulation: With patient Time For Goal Achievement: 11/28/23 Potential to Achieve Goals: Good   OT Frequency:  Min 2X/week    Co-evaluation              AM-PAC OT "6 Clicks" Daily Activity     Outcome Measure Help from another person eating meals?: None Help from another person taking care of personal grooming?: A Little Help from another person toileting, which includes using toliet, bedpan, or urinal?: A Little Help from another person bathing (including washing, rinsing, drying)?: A Little Help from another person to put on and taking off regular upper  body clothing?: A Little Help from another person to put on and taking off regular lower body clothing?: A Little 6 Click Score: 19   End of Session Equipment Utilized During Treatment: Gait belt;Rolling walker (2 wheels) Nurse Communication: Mobility status  Activity Tolerance: Patient tolerated treatment well Patient left: in bed;with call bell/phone within reach;with bed alarm set  OT Visit Diagnosis: Unsteadiness on feet (R26.81);Other abnormalities of gait and mobility (R26.89);Muscle weakness (generalized) (M62.81);Pain Pain - Right/Left: Right Pain - part of body:  (head)                Time: 0454-0981 OT Time Calculation (min): 37 min Charges:   OT General Charges $OT Visit: 1 Visit OT Evaluation $OT Eval Low Complexity: 1 Low OT Treatments $Self Care/Home Management : 8-22 mins  [place], OTR/L   Alexis Goodell 11/13/2023, 4:44 PM

## 2023-11-13 NOTE — Progress Notes (Signed)
 PROGRESS NOTE    Jill Preston  VWU:[id number] DOB: Jan 31, 1933 DOA: 11/12/2023 PCP: [name], NP    Chief Complaint  Patient presents with   Weakness    Brief Narrative:  Patient pleasant 89 year old female history of hypertension, hyperlipidemia, CAD, chronic HFpEF, CKD 3A, hypothyroidism presented generalized weakness, lightheadedness on standing with multiple falls.  Patient noted to have systolic blood pressures in the 90s on presentation.  Patient also with complaints of suprapubic pain, urinalysis concerning for UTI.  Patient placed on IV antibiotics, IV fluids, supportive care.   Assessment & Plan:   Principal Problem:   Orthostatic hypotension Active Problems:   Frequent falls   CAD - LAD stent 12/00. Patent '02 and 1/13, moderate D2 managed medically   Essential hypertension   Chronic diastolic CHF (congestive heart failure) (HCC)   PAD (peripheral artery disease) (HCC)   Asthma   Thrombocytopenia (HCC)   Hypokalemia   Hyponatremia   Acute cystitis   Hypomagnesemia  #1 orthostatic hypotension -Patient presenting with generalized weakness, lightheadedness with positional changes, with recurrent falls noted to be on diuretics of Demadex as well as Imdur and Norvasc prior to admission. -Patient noted with recent poor oral intake. -Patient with elevated troponins however flattened. -Continue IV fluids for another 24 hours and repeat orthostatics in the AM.  2.  Frequent falls -Likely secondary to orthostatic hypotension in the setting of UTI and electrolyte abnormalities. -IV fluids, continue Parikh IV antibiotics pending urine cultures, replace electrolytes. -PT/OT.  3.  UTI -Urine cultures pending. -Increase IV Rocephin to 2 g daily.  4.  Elevated troponins -Troponins elevated but flat in, patient asymptomatic denies any chest pain or significant shortness of breath. -2D echo obtained with a EF of 55 to 60%,NWMA, grade 2 DD, mild to moderately  dilated left atrial size, mild MVR. -Supportive care.  5.  Hypokalemia/hypomagnesemia -Potassium noted at 3.1 on presentation with repeat at 2.7 this morning.  Magnesium at 1.6. -Patient noted to be on diuretics prior to admission. -Magnesium sulfate 4 g IV x 1. -K-Dur 40 mEq p.o. every 4 hours x 3 doses. -Repeat labs in the AM.  6.  Hypothyroidism -Continue home dose Synthroid.  7.  Hyponatremia -Likely secondary to hypovolemic hyponatremia. -Improving with hydration.  8.  Hypertension -Patient noted to have systolic blood pressures in the 90s on presentation, noted to have orthostatic hypotension. -Continue to hold antihypertensive medications.  9.  CKD stage IIIa -Stable. -Monitor with hydration.  10.  CAD -Stable. -Asymptomatic. -Cardiac enzymes noted to be elevated but flattened. -2D echo obtained with a EF of 55 to 60%,NWMA, grade 2 DD, mild to moderately dilated left atrial size, mild MVR.  11.  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 -Patient with no overt bleeding. -Follow.    DVT prophylaxis: SCDs Code Status: DNR limited  Family Communication: Updated patient and niece at bedside. Disposition: TBD  Status is: Inpatient The patient will require care spanning > 2 midnights and should be moved to inpatient because: Severity of illness   Consultants:  None  Procedures:  CT Head 11/12/2023 Chest x-ray 11/12/2023 2D echo 11/13/2023  Antimicrobials:  Anti-infectives (From admission, onward)    Start     Dose/Rate Route Frequency Ordered Stop   11/13/23 2200  cefTRIAXone (ROCEPHIN) 1 g in sodium chloride 0.9 % 100 mL IVPB  Status:  Discontinued        1 g 200 mL/hr over 30 Minutes Intravenous Every 24 hours 11/12/23 2156  11/13/23 1504   11/13/23 2200  cefTRIAXone (ROCEPHIN) 2 g in sodium chloride 0.9 % 100 mL IVPB        2 g 200 mL/hr over 30 Minutes Intravenous Every 24 hours 11/13/23 1504     11/12/23 2200   cefTRIAXone (ROCEPHIN) 1 g in sodium chloride 0.9 % 100 mL IVPB        1 g 200 mL/hr over 30 Minutes Intravenous  Once 11/12/23 2146 11/13/23 0243         Subjective: Patient laying in bed.  Niece at bedside.  Still with some complaints of lightheadedness and dizziness from supine to standing position however improved since admission.  Denies any chest pain or shortness of breath.  States suprapubic abdominal pain improving.  Tolerating some breakfast this morning.  Denied any nausea or vomiting or diarrhea prior to admission.  Objective: Vitals:   11/13/23 0700 11/13/23 0900 11/13/23 1030 11/13/23 1100  BP: 120/60 (!) 103/48 106/78 116/77  Pulse: (!) 53  61 (!) 56  Resp: 15  (!) 21 19  Temp:      TempSrc:      SpO2: 92%  92% 92%  Weight:      Height:        Intake/Output Summary (Last 24 hours) at 11/13/2023 1515 Last data filed at 11/13/2023 1000 Gross per 24 hour  Intake 3 ml  Output --  Net 3 ml   Filed Weights   11/12/23 1348  Weight: 77.1 kg    Examination:  General exam: Appears calm and comfortable.  Dry mucous membranes. Respiratory system: Clear to auscultation. Respiratory effort normal. Cardiovascular system: S1 & S2 heard, RRR. No JVD, murmurs, rubs, gallops or clicks. No pedal edema. Gastrointestinal system: Abdomen is nondistended, soft and nontender. No organomegaly or masses felt. Normal bowel sounds heard. Central nervous system: Alert and oriented. No focal neurological deficits. Extremities: Symmetric 5 x 5 power. Skin: No rashes, lesions or ulcers Psychiatry: Judgement and insight appear normal. Mood & affect appropriate.     Data Reviewed: I have personally reviewed following labs and imaging studies  CBC: Recent Labs  Lab 11/12/23 1407 11/13/23 0651  WBC 5.0 3.5*  NEUTROABS 3.6  --   HGB 15.1* 13.8  HCT 43.6 39.9  MCV 96.0 97.3  PLT 56* 44*    Basic Metabolic Panel: Recent Labs  Lab 11/12/23 1407 11/13/23 0651  NA 128* 132*  K  3.1* 2.7*  CL 84* 92*  CO2 29 30  GLUCOSE 106* 106*  BUN 21 21  CREATININE 1.01* 0.95  CALCIUM 8.5* 8.2*  MG  --  1.6*    GFR: Estimated Creatinine Clearance: 42 mL/min (by C-G formula based on SCr of 0.95 mg/dL).  Liver Function Tests: Recent Labs  Lab 11/12/23 1407 11/13/23 0651  AST 92* 60*  ALT 36 28  ALKPHOS 70 59  BILITOT 1.7* 0.9  PROT 5.5* 5.0*  ALBUMIN 2.5* 2.2*    CBG: No results for input(s): "GLUCAP" in the last 168 hours.   Recent Results (from the past 240 hours)  Resp panel by RT-PCR (RSV, Flu A&B, Covid) Anterior Nasal Swab     Status: None   Collection Time: 11/12/23  2:08 PM   Specimen: Anterior Nasal Swab  Result Value Ref Range Status   SARS Coronavirus 2 by RT PCR NEGATIVE NEGATIVE Final   Influenza A by PCR NEGATIVE NEGATIVE Final   Influenza B by PCR NEGATIVE NEGATIVE Final    Comment: (NOTE) The Xpert  Xpress SARS-CoV-2/FLU/RSV plus assay is intended as an aid in the diagnosis of influenza from Nasopharyngeal swab specimens and should not be used as a sole basis for treatment. Nasal washings and aspirates are unacceptable for Xpert Xpress SARS-CoV-2/FLU/RSV testing.  Fact Sheet for Patients: BloggerCourse.com  Fact Sheet for Healthcare Providers: SeriousBroker.it  This test is not yet approved or cleared by the [place] FDA and has been authorized for detection and/or diagnosis of SARS-CoV-2 by FDA under an Emergency Use Authorization (EUA). This EUA will remain in effect (meaning this test can be used) for the duration of the COVID-19 declaration under Section 564(b)(1) of the Act, 21 U.S.C. section 360bbb-3(b)(1), unless the authorization is terminated or revoked.     Resp Syncytial Virus by PCR NEGATIVE NEGATIVE Final    Comment: (NOTE) Fact Sheet for Patients: BloggerCourse.com  Fact Sheet for Healthcare  Providers: SeriousBroker.it  This test is not yet approved or cleared by the [place] FDA and has been authorized for detection and/or diagnosis of SARS-CoV-2 by FDA under an Emergency Use Authorization (EUA). This EUA will remain in effect (meaning this test can be used) for the duration of the COVID-19 declaration under Section 564(b)(1) of the Act, 21 U.S.C. section 360bbb-3(b)(1), unless the authorization is terminated or revoked.  Performed at [facility], [address], [place], [place] [address]          Radiology Studies: ECHOCARDIOGRAM COMPLETE Result Date: 11/13/2023    ECHOCARDIOGRAM REPORT   Patient Name:   Jill Preston Date of Exam: 11/13/2023 Medical Rec #:  [id number]         Height:       65.0 in Accession #:    [id number]        Weight:       170.0 lb Date of Birth:  January 14, 1933        BSA:          1.846 m Patient Age:    89 years          BP:           111/57 mmHg Patient Gender: F                 HR:           59 bpm. Exam Location:  Inpatient Procedure: 2D Echo, Color Doppler and Cardiac Doppler (Both Spectral and Color            Flow Doppler were utilized during procedure). Indications:    Elevated Troponins  History:        Patient has prior history of Echocardiogram examinations, most                 recent 04/22/2020. CHF, CAD; Risk Factors:Hypertension and                 Dyslipidemia.  Sonographer:    Irving Burton Senior RDCS Referring Phys: [id number] Leontine Radman V Karo Rog IMPRESSIONS  1. Left ventricular ejection fraction, by estimation, is 55 to 60%. The left ventricle has normal function. The left ventricle has no regional wall motion abnormalities. Left ventricular diastolic parameters are consistent with Grade II diastolic dysfunction (pseudonormalization).  2. Right ventricular systolic function is normal. The right ventricular size is normal. Tricuspid regurgitation signal is inadequate for assessing PA pressure.  3. Left atrial size was  mild to moderately dilated.  4. The mitral valve is normal in structure. Mild mitral valve regurgitation.  5. The aortic valve is tricuspid.  There is mild calcification of the aortic valve. Aortic valve regurgitation is not visualized.  6. The inferior vena cava is normal in size with <50% respiratory variability, suggesting right atrial pressure of 8 mmHg. FINDINGS  Left Ventricle: Left ventricular ejection fraction, by estimation, is 55 to 60%. The left ventricle has normal function. The left ventricle has no regional wall motion abnormalities. Strain imaging was not performed. The left ventricular internal cavity  size was normal in size. There is no left ventricular hypertrophy. Left ventricular diastolic parameters are consistent with Grade II diastolic dysfunction (pseudonormalization). Right Ventricle: The right ventricular size is normal. No increase in right ventricular wall thickness. Right ventricular systolic function is normal. Tricuspid regurgitation signal is inadequate for assessing PA pressure. Left Atrium: Left atrial size was mild to moderately dilated. Right Atrium: Right atrial size was normal in size. Pericardium: There is no evidence of pericardial effusion. Presence of epicardial fat layer. Mitral Valve: The mitral valve is normal in structure. Mild mitral annular calcification. Mild mitral valve regurgitation. Tricuspid Valve: The tricuspid valve is normal in structure. Tricuspid valve regurgitation is trivial. Aortic Valve: The aortic valve is tricuspid. There is mild calcification of the aortic valve. Aortic valve regurgitation is not visualized. Pulmonic Valve: The pulmonic valve was normal in structure. Pulmonic valve regurgitation is trivial. Aorta: The aortic root and ascending aorta are structurally normal, with no evidence of dilitation. Venous: The inferior vena cava is normal in size with less than 50% respiratory variability, suggesting right atrial pressure of 8 mmHg. IAS/Shunts:  No atrial level shunt detected by color flow Doppler. Additional Comments: 3D imaging was not performed.  LEFT VENTRICLE PLAX 2D LVIDd:         4.40 cm   Diastology LVIDs:         3.00 cm   LV e' medial:    7.46 cm/s LV PW:         0.70 cm   LV E/e' medial:  11.4 LV IVS:        0.80 cm   LV e' lateral:   8.08 cm/s LVOT diam:     1.90 cm   LV E/e' lateral: 10.5 LV SV:         86 LV SV Index:   47 LVOT Area:     2.84 cm  RIGHT VENTRICLE RV S prime:     10.70 cm/s TAPSE (M-mode): 1.8 cm LEFT ATRIUM             Index        RIGHT ATRIUM           Index LA diam:        3.70 cm 2.00 cm/m   RA Area:     13.80 cm LA Vol (A2C):   81.6 ml 44.20 ml/m  RA Volume:   31.90 ml  17.28 ml/m LA Vol (A4C):   61.5 ml 33.31 ml/m LA Biplane Vol: 70.8 ml 38.35 ml/m  AORTIC VALVE LVOT Vmax:   137.00 cm/s LVOT Vmean:  89.000 cm/s LVOT VTI:    0.305 m  AORTA Ao Root diam: 2.90 cm Ao Asc diam:  2.90 cm MITRAL VALVE MV Area (PHT): 2.55 cm    SHUNTS MV Decel Time: 298 msec    Systemic VTI:  0.30 m MV E velocity: 85.20 cm/s  Systemic Diam: 1.90 cm MV A velocity: 46.30 cm/s MV E/A ratio:  1.84 Arvilla Meres MD Electronically signed by Arvilla Meres MD Signature Date/Time: 11/13/2023/2:50:30 PM  Final    DG Chest 2 View Result Date: 11/12/2023 CLINICAL DATA:  Weakness. EXAM: CHEST - 2 VIEW COMPARISON:  11/12/2022. FINDINGS: Bilateral lung fields are clear. Bilateral costophrenic angles are clear. Stable cardio-mediastinal silhouette. No acute osseous abnormalities. The soft tissues are within normal limits. IMPRESSION: No active cardiopulmonary disease. Electronically Signed   By: Jules Schick M.D.   On: 11/12/2023 16:31   CT Head Wo Contrast Result Date: 11/12/2023 CLINICAL DATA:  Bilateral leg weakness for 1 week, increased falls and dizziness EXAM: CT HEAD WITHOUT CONTRAST TECHNIQUE: Contiguous axial images were obtained from the base of the skull through the vertex without intravenous contrast. RADIATION DOSE REDUCTION:  This exam was performed according to the departmental dose-optimization program which includes automated exposure control, adjustment of the mA and/or kV according to patient size and/or use of iterative reconstruction technique. COMPARISON:  06/30/2023 FINDINGS: Brain: Stable chronic small-vessel ischemic changes throughout the periventricular white matter and basal ganglia. No evidence of acute infarct or hemorrhage. Lateral ventricles and midline structures are grossly unremarkable. There is a stable 7 mm posterior falcine meningioma, without mass effect. No acute extra-axial fluid collections. Vascular: Stable atherosclerosis.  No hyperdense vessel. Skull: Normal. Negative for fracture or focal lesion. Sinuses/Orbits: No acute finding. Other: None. IMPRESSION: 1. No acute intracranial process. 2. Stable chronic small-vessel ischemic changes throughout the white matter and basal ganglia. 3. Stable small left posterior para falcine meningioma measuring 7 mm. No mass effect. Electronically Signed   By: Sharlet Salina M.D.   On: 11/12/2023 16:05        Scheduled Meds:  cyanocobalamin  1,000 mcg Oral Daily   ezetimibe  10 mg Oral q morning   feeding supplement  474 mL Oral BID   latanoprost  1 drop Both Eyes QHS   levothyroxine  75 mcg Oral QAC breakfast   potassium chloride  40 mEq Oral Q4H   pravastatin  80 mg Oral QHS   sertraline  25 mg Oral Daily   sodium chloride flush  3 mL Intravenous Q12H   Continuous Infusions:  cefTRIAXone (ROCEPHIN)  IV     lactated ringers 100 mL/hr at 11/13/23 1119     LOS: 0 days    Time spent: 40 minutes    Ramiro Harvest, MD [facility]   To contact the attending provider between 7A-7P or the covering provider during after hours 7P-7A, please log into the web site [email or web address] and access using universal [place] password for that web site. If you do not have the password, please call the hospital operator.  11/13/2023, 3:15 PM

## 2023-11-13 NOTE — Evaluation (Signed)
 Physical Therapy Evaluation Patient Details Name: Jill Preston MRN: [id number] DOB: 27-Apr-1933 Today's Date: 11/13/2023  History of Present Illness  Pt is 89 yo presenting to [facility] ED on 2/25 due to general weakness, lightheaded on standing and multiple falls. PMH: HTN, HLD, CAD, Chronic HFpEF, CKD III and hypothyroidism.  Clinical Impression  Pt is presenting below baseline level of functioning. Prior to hospitalization pt was mod I. Currently pt is CGA for bed mobility and Min A for sit to stand with RW. Pt is reporting dizziness with transitions and BP is dropping from sitting to standing. Unable to progress gait due to symptomatic BP drop. Due to pt current functional status, home set up and available assistance at home recommending skilled physical therapy services < 3 hours/day in order to address strength, balance and functional mobility to decrease risk for falls, injury, immobility, skin break down and re-hospitalization.        If plan is discharge home, recommend the following: A little help with walking and/or transfers;Assist for transportation;Help with stairs or ramp for entrance;Assistance with cooking/housework   Can travel by private vehicle   No    Equipment Recommendations BSC/3in1     Functional Status Assessment Patient has had a recent decline in their functional status and demonstrates the ability to make significant improvements in function in a reasonable and predictable amount of time.     Precautions / Restrictions Precautions Precautions: Fall Restrictions Weight Bearing Restrictions Per Provider Order: No      Mobility  Bed Mobility Overal bed mobility: Needs Assistance Bed Mobility: Sit to Supine, Supine to Sit     Supine to sit: Contact guard Sit to supine: Min assist   General bed mobility comments: CGA for getting to EOB and Min A for sitting to supine with LE    Transfers Overall transfer level: Needs assistance Equipment used:  Rolling walker (2 wheels) Transfers: Sit to/from Stand Sit to Stand: Contact guard assist           General transfer comment: CGA for safety pt was orthostatic.    Ambulation/Gait   General Gait Details: did not ambulate due to blood pressures.     Balance Overall balance assessment: Needs assistance Sitting-balance support: Bilateral upper extremity supported, Feet supported, Feet unsupported Sitting balance-Leahy Scale: Fair Sitting balance - Comments: sitting edge of stretcher.   Standing balance support: Bilateral upper extremity supported, Reliant on assistive device for balance, During functional activity Standing balance-Leahy Scale: Poor Standing balance comment: Min A for standing balance Moderate UE support on RW to maintain upright posture.         Pertinent Vitals/Pain Pain Assessment Pain Assessment: 0-10 Pain Score: 9  Pain Location: pain in the R temporal and L occipital areas. Pt reports hitting her head when she fell. And at IV site. (getting potassium) Pain Descriptors / Indicators: Aching, Burning Pain Intervention(s): Monitored during session, Limited activity within patient's tolerance    Home Living Family/patient expects to be discharged to:: Private residence Living Arrangements: Spouse/significant other Available Help at Discharge: Family;Personal care attendant (M-F caretiver 9-4 pm) Type of Home: House Home Access: Stairs to enter;Ramped entrance Entrance Stairs-Rails: Can reach both Entrance Stairs-Number of Steps: 8   Home Layout: One level Home Equipment: Tub bench;Rolling Walker (2 wheels);Standard Walker;Rollator (4 wheels);Wheelchair - manual;Hand held shower head Additional Comments: Lives with elderly husband who was just ill.    Prior Function Prior Level of Function : Independent/Modified Independent;History of Falls (last six months)  Mobility Comments: Pt reports that she uses a RW. Reports 5 falls over the  weekend before that no falls in the past year. ADLs Comments: family helps with transportation     Extremity/Trunk Assessment   Upper Extremity Assessment Upper Extremity Assessment: Defer to OT evaluation    Lower Extremity Assessment Lower Extremity Assessment: Generalized weakness    Cervical / Trunk Assessment Cervical / Trunk Assessment: Kyphotic  Communication   Communication Communication: Impaired Factors Affecting Communication: Hearing impaired    Cognition Arousal: Alert Behavior During Therapy: WFL for tasks assessed/performed   PT - Cognitive impairments: Problem solving, Safety/Judgement, Attention     PT - Cognition Comments: history of concussion Following commands: Intact       Cueing Cueing Techniques: Verbal cues     General Comments General comments (skin integrity, edema, etc.): 121/63 supine, 128/67 sitting, 106/78 standing. Strange pattern of bruises on the back with most on the R side slightly larger diameter than a quarter; perfect circles.        Assessment/Plan    PT Assessment Patient needs continued PT services  PT Problem List Decreased strength;Decreased balance;Decreased mobility;Decreased activity tolerance;Pain       PT Treatment Interventions DME instruction;Therapeutic activities;Gait training;Therapeutic exercise;Balance training;Functional mobility training;Patient/family education;Neuromuscular re-education    PT Goals (Current goals can be found in the Care Plan section)  Acute Rehab PT Goals Patient Stated Goal: to improve mobility, get pain out of head and feel less dizzy PT Goal Formulation: With patient Time For Goal Achievement: 11/27/23 Potential to Achieve Goals: Fair    Frequency Min 1X/week        AM-PAC PT "6 Clicks" Mobility  Outcome Measure Help needed turning from your back to your side while in a flat bed without using bedrails?: A Little Help needed moving from lying on your back to sitting on the  side of a flat bed without using bedrails?: A Little Help needed moving to and from a bed to a chair (including a wheelchair)?: A Little Help needed standing up from a chair using your arms (e.g., wheelchair or bedside chair)?: A Little Help needed to walk in hospital room?: A Lot Help needed climbing 3-5 steps with a railing? : A Lot 6 Click Score: 16    End of Session Equipment Utilized During Treatment: Gait belt;Oxygen Activity Tolerance: Patient tolerated treatment well;Other (comment) (limited due to blood pressure) Patient left: in bed;with call bell/phone within reach;with family/visitor present Nurse Communication: Mobility status PT Visit Diagnosis: Other abnormalities of gait and mobility (R26.89)    Time: 1610-9604 PT Time Calculation (min) (ACUTE ONLY): 38 min   Charges:   PT Evaluation $PT Eval Low Complexity: 1 Low PT Treatments $Therapeutic Activity: 23-37 mins PT General Charges $$ ACUTE PT VISIT: 1 Visit         Harrel Carina, DPT, CLT  Acute Rehabilitation Services Office: [phone] (Secure chat preferred)   Claudia Desanctis 11/13/2023, 12:31 PM

## 2023-11-13 NOTE — ED Notes (Signed)
 Right arm elevated and ice pack applied for IV infiltration per pharmacy

## 2023-11-13 NOTE — Plan of Care (Signed)

## 2023-11-13 NOTE — Progress Notes (Signed)
 Echocardiogram 2D Echocardiogram has been performed.  Warren Lacy Antione Obar RDCS 11/13/2023, 2:27 PM

## 2023-11-13 NOTE — ED Notes (Signed)
 Notified provider of critical potassium

## 2023-11-14 DIAGNOSIS — I25118 Atherosclerotic heart disease of native coronary artery with other forms of angina pectoris: Secondary | ICD-10-CM | POA: Diagnosis not present

## 2023-11-14 DIAGNOSIS — I951 Orthostatic hypotension: Secondary | ICD-10-CM | POA: Diagnosis not present

## 2023-11-14 DIAGNOSIS — R296 Repeated falls: Secondary | ICD-10-CM | POA: Diagnosis not present

## 2023-11-14 LAB — CBC
HCT: 40.5 % (ref 36.0–46.0)
Hemoglobin: 13.6 g/dL (ref 12.0–15.0)
MCH: 33.1 pg (ref 26.0–34.0)
MCHC: 33.6 g/dL (ref 30.0–36.0)
MCV: 98.5 fL (ref 80.0–100.0)
Platelets: 54 10*3/uL — ABNORMAL LOW (ref 150–400)
RBC: 4.11 MIL/uL (ref 3.87–5.11)
RDW: 13.6 % (ref 11.5–15.5)
WBC: 3.5 10*3/uL — ABNORMAL LOW (ref 4.0–10.5)
nRBC: 0 % (ref 0.0–0.2)

## 2023-11-14 LAB — COMPREHENSIVE METABOLIC PANEL
ALT: 28 U/L (ref 0–44)
AST: 50 U/L — ABNORMAL HIGH (ref 15–41)
Albumin: 2.1 g/dL — ABNORMAL LOW (ref 3.5–5.0)
Alkaline Phosphatase: 66 U/L (ref 38–126)
Anion gap: 9 (ref 5–15)
BUN: 20 mg/dL (ref 8–23)
CO2: 30 mmol/L (ref 22–32)
Calcium: 9.1 mg/dL (ref 8.9–10.3)
Chloride: 97 mmol/L — ABNORMAL LOW (ref 98–111)
Creatinine, Ser: 0.73 mg/dL (ref 0.44–1.00)
GFR, Estimated: >60 mL/min (ref >60)
Glucose, Bld: 105 mg/dL — ABNORMAL HIGH (ref 70–99)
Potassium: 4.3 mmol/L (ref 3.5–5.1)
Sodium: 136 mmol/L (ref 135–145)
Total Bilirubin: 0.6 mg/dL (ref 0.0–1.2)
Total Protein: 4.9 g/dL — ABNORMAL LOW (ref 6.5–8.1)

## 2023-11-14 LAB — MAGNESIUM: Magnesium: 1.9 mg/dL (ref 1.7–2.4)

## 2023-11-14 MED ORDER — SODIUM CHLORIDE 0.9 % IV SOLN
1.0000 g | INTRAVENOUS | Status: DC
  Administered 2023-11-14: 1 g via INTRAVENOUS
  Filled 2023-11-14: qty 10

## 2023-11-14 NOTE — Progress Notes (Signed)
 PROGRESS NOTE    Jill Preston  VWU:[id number] DOB: 1933/05/23 DOA: 11/12/2023 PCP: [name], NP    Chief Complaint  Patient presents with   Weakness    Brief Narrative:  Patient pleasant 89 year old female history of hypertension, hyperlipidemia, CAD, chronic HFpEF, CKD 3A, hypothyroidism presented generalized weakness, lightheadedness on standing with multiple falls.  Patient noted to have systolic blood pressures in the 90s on presentation.  Patient also with complaints of suprapubic pain, urinalysis concerning for UTI.  Patient placed on IV antibiotics, IV fluids, supportive care.   Assessment & Plan:   Principal Problem:   Orthostatic hypotension Active Problems:   Frequent falls   CAD - LAD stent 12/00. Patent '02 and 1/13, moderate D2 managed medically   Essential hypertension   Chronic diastolic CHF (congestive heart failure) (HCC)   PAD (peripheral artery disease) (HCC)   Asthma   Thrombocytopenia (HCC)   Hypokalemia   Hyponatremia   Acute cystitis   Hypomagnesemia  #1 orthostatic hypotension -Patient presenting with generalized weakness, lightheadedness with positional changes, with recurrent falls noted to be on diuretics of Demadex as well as Imdur and Norvasc prior to admission. -Patient noted with recent poor oral intake. -Patient with elevated troponins however flattened. -Improved with hydration.   -Continue to hold Imdur, Norvasc, Demadex.   2.  Frequent falls -Likely secondary to orthostatic hypotension in the setting of UTI and electrolyte abnormalities. -IV fluids, continue IV antibiotics pending urine cultures, replace electrolytes. -PT/OT.  3.  UTI -Preliminary urine cultures with > 100,000 colonies of GNR.  -Continue IV Rocephin.   4.  Elevated troponins -Troponins elevated but flat in, patient asymptomatic denies any chest pain or significant shortness of breath. -2D echo obtained with a EF of 55 to 60%,NWMA, grade 2 DD, mild to  moderately dilated left atrial size, mild MVR. -Supportive care.  5.  Hypokalemia/hypomagnesemia -Potassium noted at 3.1 on presentation with repeat at 2.7. Magnesium at 1.6. -Patient noted to be on diuretics prior to admission. -Electrolytes repleted, potassium of 4.3 this morning, magnesium at 1.9.  -Repeat labs in the AM.  6.  Hypothyroidism -Synthroid.   7.  Hyponatremia -Likely secondary to hypovolemic hyponatremia. -Improved with hydration.    8.  Hypertension -Patient noted to have systolic blood pressures in the 90s on presentation, noted to have orthostatic hypotension. -BP improved with hydration. -Continue to hold antihypertensive medications.   9.  CKD stage IIIa -Stable. -Monitor with hydration.  10.  CAD -Stable. -Asymptomatic. -Cardiac enzymes noted to be elevated but flattened. -2D echo obtained with a EF of 55 to 60%,NWMA, grade 2 DD, mild to moderately dilated left atrial size, mild MVR. -Continue to hold cardiac medications as patient had presented with orthostatic hypotension.   11.  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 -Patient with no overt bleeding. -Count slowly trending up.    DVT prophylaxis: SCDs Code Status: DNR limited  Family Communication: Updated patient.  No family at bedside. Disposition: TBD  Status is: Inpatient The patient will require care spanning > 2 midnights and should be moved to inpatient because: Severity of illness   Consultants:  None  Procedures:  CT Head 11/12/2023 Chest x-ray 11/12/2023 2D echo 11/13/2023  Antimicrobials:  Anti-infectives (From admission, onward)    Start     Dose/Rate Route Frequency Ordered Stop   11/14/23 2200  cefTRIAXone (ROCEPHIN) 1 g in sodium chloride 0.9 % 100 mL IVPB        1 g  200 mL/hr over 30 Minutes Intravenous Every 24 hours 11/14/23 0648     11/13/23 2200  cefTRIAXone (ROCEPHIN) 1 g in sodium chloride 0.9 % 100 mL IVPB  Status:   Discontinued        1 g 200 mL/hr over 30 Minutes Intravenous Every 24 hours 11/12/23 2156 11/13/23 1504   11/13/23 2200  cefTRIAXone (ROCEPHIN) 2 g in sodium chloride 0.9 % 100 mL IVPB  Status:  Discontinued        2 g 200 mL/hr over 30 Minutes Intravenous Every 24 hours 11/13/23 1504 11/14/23 0648   11/12/23 2200  cefTRIAXone (ROCEPHIN) 1 g in sodium chloride 0.9 % 100 mL IVPB        1 g 200 mL/hr over 30 Minutes Intravenous  Once 11/12/23 2146 11/13/23 0243         Subjective: Patient sitting on bedside commode.  States overall feels significantly better than on admission.  States lightheadedness and dizziness have improved significantly.  Slept well after receiving Tylenol last night.  Suprapubic abdominal pain improved per patient.   Objective: Vitals:   11/13/23 2050 11/13/23 2054 11/13/23 2056 11/14/23 0715  BP: (!) 132/55 (!) 109/57 130/60 138/61  Pulse: 68 80 70 63  Resp: 16 18  18   Temp: 97.9 F (36.6 C)   (!) 97.4 F (36.3 C)  TempSrc:    Oral  SpO2: 97% 96%  96%  Weight:      Height:        Intake/Output Summary (Last 24 hours) at 11/14/2023 1046 Last data filed at 11/14/2023 0445 Gross per 24 hour  Intake 2218.91 ml  Output --  Net 2218.91 ml   Filed Weights   11/12/23 1348  Weight: 77.1 kg    Examination:  General exam: NAD.  Respiratory system: Lungs clear to auscultation.  No crackles rhonchi.  Fair air movement.  Speaking in full sentences.  Cardiovascular system.  RRR no m/r/g. No LE edema. Gastrointestinal system: Abdomen is soft, nontender, nondistended bowel sounds.  No rebound.  No guarding.  Central nervous system: Alert and oriented. No focal neurological deficits. Extremities: Symmetric 5 x 5 power. Skin: No rashes, lesions or ulcers Psychiatry: Judgement and insight appear normal. Mood & affect appropriate.     Data Reviewed: I have personally reviewed following labs and imaging studies  CBC: Recent Labs  Lab 11/12/23 1407  11/13/23 0651 11/14/23 0703  WBC 5.0 3.5* 3.5*  NEUTROABS 3.6  --   --   HGB 15.1* 13.8 13.6  HCT 43.6 39.9 40.5  MCV 96.0 97.3 98.5  PLT 56* 44* 54*    Basic Metabolic Panel: Recent Labs  Lab 11/12/23 1407 11/13/23 0651 11/14/23 0703  NA 128* 132* 136  K 3.1* 2.7* 4.3  CL 84* 92* 97*  CO2 29 30 30   GLUCOSE 106* 106* 105*  BUN 21 21 20   CREATININE 1.01* 0.95 0.73  CALCIUM 8.5* 8.2* 9.1  MG  --  1.6* 1.9    GFR: Estimated Creatinine Clearance: 49.9 mL/min (by C-G formula based on SCr of 0.73 mg/dL).  Liver Function Tests: Recent Labs  Lab 11/12/23 1407 11/13/23 0651 11/14/23 0703  AST 92* 60* 50*  ALT 36 28 28  ALKPHOS 70 59 66  BILITOT 1.7* 0.9 0.6  PROT 5.5* 5.0* 4.9*  ALBUMIN 2.5* 2.2* 2.1*    CBG: No results for input(s): "GLUCAP" in the last 168 hours.   Recent Results (from the past 240 hours)  Resp panel by RT-PCR (  RSV, Flu A&B, Covid) Anterior Nasal Swab     Status: None   Collection Time: 11/12/23  2:08 PM   Specimen: Anterior Nasal Swab  Result Value Ref Range Status   SARS Coronavirus 2 by RT PCR NEGATIVE NEGATIVE Final   Influenza A by PCR NEGATIVE NEGATIVE Final   Influenza B by PCR NEGATIVE NEGATIVE Final    Comment: (NOTE) The Xpert Xpress SARS-CoV-2/FLU/RSV plus assay is intended as an aid in the diagnosis of influenza from Nasopharyngeal swab specimens and should not be used as a sole basis for treatment. Nasal washings and aspirates are unacceptable for Xpert Xpress SARS-CoV-2/FLU/RSV testing.  Fact Sheet for Patients: BloggerCourse.com  Fact Sheet for Healthcare Providers: SeriousBroker.it  This test is not yet approved or cleared by the [place] FDA and has been authorized for detection and/or diagnosis of SARS-CoV-2 by FDA under an Emergency Use Authorization (EUA). This EUA will remain in effect (meaning this test can be used) for the duration of the COVID-19  declaration under Section 564(b)(1) of the Act, 21 U.S.C. section 360bbb-3(b)(1), unless the authorization is terminated or revoked.     Resp Syncytial Virus by PCR NEGATIVE NEGATIVE Final    Comment: (NOTE) Fact Sheet for Patients: BloggerCourse.com  Fact Sheet for Healthcare Providers: SeriousBroker.it  This test is not yet approved or cleared by the [place] FDA and has been authorized for detection and/or diagnosis of SARS-CoV-2 by FDA under an Emergency Use Authorization (EUA). This EUA will remain in effect (meaning this test can be used) for the duration of the COVID-19 declaration under Section 564(b)(1) of the Act, 21 U.S.C. section 360bbb-3(b)(1), unless the authorization is terminated or revoked.  Performed at [facility], [address] [place], [place] [address]   Urine Culture     Status: Abnormal (Preliminary result)   Collection Time: 11/12/23  8:36 PM   Specimen: Urine, Clean Catch  Result Value Ref Range Status   Specimen Description URINE, CLEAN CATCH  Final   Special Requests Immunocompromised  Final   Culture (A)  Final    >=100,000 COLONIES/mL GRAM NEGATIVE RODS SUSCEPTIBILITIES TO FOLLOW Performed at [facility], [address], [place], [place] [address]    Report Status PENDING  Incomplete         Radiology Studies: ECHOCARDIOGRAM COMPLETE Result Date: 11/13/2023    ECHOCARDIOGRAM REPORT   Patient Name:   Jill Preston Date of Exam: 11/13/2023 Medical Rec #:  [id number]         Height:       65.0 in Accession #:    [id number]        Weight:       170.0 lb Date of Birth:  1933/02/10        BSA:          1.846 m Patient Age:    89 years          BP:           111/57 mmHg Patient Gender: F                 HR:           59 bpm. Exam Location:  Inpatient Procedure: 2D Echo, Color Doppler and Cardiac Doppler (Both Spectral and Color            Flow Doppler were utilized during  procedure). Indications:    Elevated Troponins  History:        Patient  has prior history of Echocardiogram examinations, most                 recent 04/22/2020. CHF, CAD; Risk Factors:Hypertension and                 Dyslipidemia.  Sonographer:    Irving Burton Senior RDCS Referring Phys: [id number] Jonaya Freshour V Cambren Helm IMPRESSIONS  1. Left ventricular ejection fraction, by estimation, is 55 to 60%. The left ventricle has normal function. The left ventricle has no regional wall motion abnormalities. Left ventricular diastolic parameters are consistent with Grade II diastolic dysfunction (pseudonormalization).  2. Right ventricular systolic function is normal. The right ventricular size is normal. Tricuspid regurgitation signal is inadequate for assessing PA pressure.  3. Left atrial size was mild to moderately dilated.  4. The mitral valve is normal in structure. Mild mitral valve regurgitation.  5. The aortic valve is tricuspid. There is mild calcification of the aortic valve. Aortic valve regurgitation is not visualized.  6. The inferior vena cava is normal in size with <50% respiratory variability, suggesting right atrial pressure of 8 mmHg. FINDINGS  Left Ventricle: Left ventricular ejection fraction, by estimation, is 55 to 60%. The left ventricle has normal function. The left ventricle has no regional wall motion abnormalities. Strain imaging was not performed. The left ventricular internal cavity  size was normal in size. There is no left ventricular hypertrophy. Left ventricular diastolic parameters are consistent with Grade II diastolic dysfunction (pseudonormalization). Right Ventricle: The right ventricular size is normal. No increase in right ventricular wall thickness. Right ventricular systolic function is normal. Tricuspid regurgitation signal is inadequate for assessing PA pressure. Left Atrium: Left atrial size was mild to moderately dilated. Right Atrium: Right atrial size was normal in size. Pericardium: There is  no evidence of pericardial effusion. Presence of epicardial fat layer. Mitral Valve: The mitral valve is normal in structure. Mild mitral annular calcification. Mild mitral valve regurgitation. Tricuspid Valve: The tricuspid valve is normal in structure. Tricuspid valve regurgitation is trivial. Aortic Valve: The aortic valve is tricuspid. There is mild calcification of the aortic valve. Aortic valve regurgitation is not visualized. Pulmonic Valve: The pulmonic valve was normal in structure. Pulmonic valve regurgitation is trivial. Aorta: The aortic root and ascending aorta are structurally normal, with no evidence of dilitation. Venous: The inferior vena cava is normal in size with less than 50% respiratory variability, suggesting right atrial pressure of 8 mmHg. IAS/Shunts: No atrial level shunt detected by color flow Doppler. Additional Comments: 3D imaging was not performed.  LEFT VENTRICLE PLAX 2D LVIDd:         4.40 cm   Diastology LVIDs:         3.00 cm   LV e' medial:    7.46 cm/s LV PW:         0.70 cm   LV E/e' medial:  11.4 LV IVS:        0.80 cm   LV e' lateral:   8.08 cm/s LVOT diam:     1.90 cm   LV E/e' lateral: 10.5 LV SV:         86 LV SV Index:   47 LVOT Area:     2.84 cm  RIGHT VENTRICLE RV S prime:     10.70 cm/s TAPSE (M-mode): 1.8 cm LEFT ATRIUM             Index        RIGHT ATRIUM  Index LA diam:        3.70 cm 2.00 cm/m   RA Area:     13.80 cm LA Vol (A2C):   81.6 ml 44.20 ml/m  RA Volume:   31.90 ml  17.28 ml/m LA Vol (A4C):   61.5 ml 33.31 ml/m LA Biplane Vol: 70.8 ml 38.35 ml/m  AORTIC VALVE LVOT Vmax:   137.00 cm/s LVOT Vmean:  89.000 cm/s LVOT VTI:    0.305 m  AORTA Ao Root diam: 2.90 cm Ao Asc diam:  2.90 cm MITRAL VALVE MV Area (PHT): 2.55 cm    SHUNTS MV Decel Time: 298 msec    Systemic VTI:  0.30 m MV E velocity: 85.20 cm/s  Systemic Diam: 1.90 cm MV A velocity: 46.30 cm/s MV E/A ratio:  1.84 Arvilla Meres MD Electronically signed by Arvilla Meres MD Signature  Date/Time: 11/13/2023/2:50:30 PM    Final    DG Chest 2 View Result Date: 11/12/2023 CLINICAL DATA:  Weakness. EXAM: CHEST - 2 VIEW COMPARISON:  11/12/2022. FINDINGS: Bilateral lung fields are clear. Bilateral costophrenic angles are clear. Stable cardio-mediastinal silhouette. No acute osseous abnormalities. The soft tissues are within normal limits. IMPRESSION: No active cardiopulmonary disease. Electronically Signed   By: Jules Schick M.D.   On: 11/12/2023 16:31   CT Head Wo Contrast Result Date: 11/12/2023 CLINICAL DATA:  Bilateral leg weakness for 1 week, increased falls and dizziness EXAM: CT HEAD WITHOUT CONTRAST TECHNIQUE: Contiguous axial images were obtained from the base of the skull through the vertex without intravenous contrast. RADIATION DOSE REDUCTION: This exam was performed according to the departmental dose-optimization program which includes automated exposure control, adjustment of the mA and/or kV according to patient size and/or use of iterative reconstruction technique. COMPARISON:  06/30/2023 FINDINGS: Brain: Stable chronic small-vessel ischemic changes throughout the periventricular white matter and basal ganglia. No evidence of acute infarct or hemorrhage. Lateral ventricles and midline structures are grossly unremarkable. There is a stable 7 mm posterior falcine meningioma, without mass effect. No acute extra-axial fluid collections. Vascular: Stable atherosclerosis.  No hyperdense vessel. Skull: Normal. Negative for fracture or focal lesion. Sinuses/Orbits: No acute finding. Other: None. IMPRESSION: 1. No acute intracranial process. 2. Stable chronic small-vessel ischemic changes throughout the white matter and basal ganglia. 3. Stable small left posterior para falcine meningioma measuring 7 mm. No mass effect. Electronically Signed   By: Sharlet Salina M.D.   On: 11/12/2023 16:05        Scheduled Meds:  cyanocobalamin  1,000 mcg Oral Daily   ezetimibe  10 mg Oral q  morning   feeding supplement  474 mL Oral BID   latanoprost  1 drop Both Eyes QHS   levothyroxine  75 mcg Oral QAC breakfast   pravastatin  80 mg Oral QHS   sertraline  25 mg Oral Daily   sodium chloride flush  3 mL Intravenous Q12H   Continuous Infusions:  cefTRIAXone (ROCEPHIN)  IV       LOS: 1 day    Time spent: 35 minutes    Ramiro Harvest, MD [facility]   To contact the attending provider between 7A-7P or the covering provider during after hours 7P-7A, please log into the web site [email or web address] and access using universal [place] password for that web site. If you do not have the password, please call the hospital operator.  11/14/2023, 10:46 AM

## 2023-11-14 NOTE — Progress Notes (Signed)
 Mobility Specialist: Progress Note   11/14/23 1249  Mobility  Activity Ambulated with assistance in room  Level of Assistance Contact guard assist, steadying assist  Assistive Device Front wheel walker  Distance Ambulated (ft) 60 ft  Activity Response Tolerated well  Mobility Referral Yes  Mobility visit 1 Mobility  Mobility Specialist Start Time (ACUTE ONLY) 1024  Mobility Specialist Stop Time (ACUTE ONLY) 1105  Mobility Specialist Time Calculation (min) (ACUTE ONLY) 41 min    Pre Mobility: BP 95/61 During Mobility: BP 104/68 Post Mobility: BP 101/74  Pt was agreeable to mobility session - received in bed. Cg for bed mobility, minA for STS, CG for ambulation. C/o throbbing headache. SpO2 WFL on 1LO2. Upon standing, pt stated she felt a little lightheaded but denies any dizziness - BP 101/74. Left in bed with all needs met, call bell in reach.   Maurene Capes Mobility Specialist Please contact via SecureChat or Rehab office at [phone]

## 2023-11-14 NOTE — Plan of Care (Signed)
 Pt alert x4, LW-20g, 2L-02, reg diet, x1 standby with walker,

## 2023-11-14 NOTE — TOC Initial Note (Signed)
 Transition of Care [facility]) - Initial/Assessment Note    Patient Details  Name: Jill Preston MRN: [id number] Date of Birth: 26-Oct-1932  [facility] of [facility]) CM/SW Contact:    Keron Neenan A [place], LCSW Phone Number: 11/14/2023, 10:48 AM  Clinical Narrative:                  CSW met with pt at bedside to discuss recommendation for SNF. She stated that she would rather go home. She said that [facility] [employer] is assisting pt with PT coming to home a few times a week to provide services. She said she has been to [facility] before, but would prefer to go home when she is medically stable. She stated that if she needed to pay out of pocket for personal care services, she would be willing to do that if needed for additional support. She stated that she would discuss with her spouse and children her decision. She stated that if she was unable to go home than she would do rehab at [facility] but wanted to explore all possible options for home first.   Depending on pt's progress, home versus SNF.  RNCM notified of possible home with home health services.   TOC will continue to follow.  Expected Discharge Plan: Home w Home Health Services Barriers to Discharge: Continued Medical Work up   Patient Goals and CMS Choice Patient states their goals for this hospitalization and ongoing recovery are:: wanna go home          Expected Discharge Plan and Services In-house Referral: Clinical Social Work     Living arrangements for the past 2 months: Single Family Home                                      Prior Living Arrangements/Services Living arrangements for the past 2 months: Single Family Home Lives with:: Spouse          Need for Family Participation in Patient Care: Yes (Comment) Care giver support system in place?: Yes (comment) (pt's son and grandchildren) Current home services: Home OT, Meals on wheels    Activities of Daily Living   ADL Screening (condition at time of  admission) Independently performs ADLs?: Yes (appropriate for developmental age) Is the patient deaf or have difficulty hearing?: No Does the patient have difficulty seeing, even when wearing glasses/contacts?: No Does the patient have difficulty concentrating, remembering, or making decisions?: No  Permission Sought/Granted                  Emotional Assessment Appearance:: Appears stated age Attitude/Demeanor/Rapport: Engaged Affect (typically observed): Pleasant Orientation: : Oriented to Self, Oriented to Place, Oriented to  Time, Oriented to Situation Alcohol / Substance Use: Not Applicable Psych Involvement: No (comment)  Admission diagnosis:  Hypokalemia [E87.6] Hyponatremia [E87.1] Weakness [R53.1] Multiple contusions [T07.XXXA] Frequent falls [R29.6] Multiple falls [R29.6] Patient Active Problem List   Diagnosis Date Noted   Hypomagnesemia 11/13/2023   Orthostatic hypotension 11/13/2023   Frequent falls 11/12/2023   Hyponatremia 11/12/2023   Acute cystitis 11/12/2023   Concussion 11/13/2022   Head injury due to trauma 11/12/2022   Fall 10/25/2022   Hypokalemia 03/08/2022   Sick sinus syndrome (HCC) 03/07/2022   Hordeolum externum (stye) 05/16/2021   Chronic otitis media with serous effusion 05/16/2021   Thrombocytopenia (HCC) 04/17/2021   PAD (peripheral artery disease) (HCC) 08/01/2018   Herniated intervertebral disc  of lumbar spine 06/02/2018   Bilateral leg pain 01/27/2017   Cervical spine degeneration 10/23/2015   Hypersomnolence 10/23/2015   S/P IVC filter 02/15/2015   Angina pectoris (HCC) 01/26/2015   CAD in native artery    Ischemic chest pain (HCC)    Edema of left lower extremity 09/16/2014   Acute deep vein thrombosis of left lower extremity (HCC) 09/14/2014   Anemia associated with acute blood loss 09/14/2014   Subclavian artery stenosis, left (HCC) 08/04/2014   Chronic diastolic CHF (congestive heart failure) (HCC) 07/16/2014   Essential  hypertension 07/08/2014   CKD (chronic kidney disease), stage III (HCC)    Hypertensive heart disease    Chronic venous insufficiency    Metatarsal deformity 06/24/2013   CAD - LAD stent 12/00. Patent '02 and 1/13, moderate D2 managed medically 02/26/2013   Dyslipidemia 02/26/2013   DJD (degenerative joint disease)- back 02/26/2013   Asthma 08/06/2012   Hyperlipidemia 08/05/2012   Lichen sclerosus 06/03/2012   Rectocele 06/03/2012   Recurrent UTI 06/03/2012   Urge urinary incontinence 06/03/2012   Voiding dysfunction 06/03/2012   Hypothyroidism 07/23/2011   PCP:  [name], NP Pharmacy:   [facility] [phone] [place], [place] - [address] AT [facility] [address] [place] [address] Phone: [phone] Fax: [phone]     Social Drivers of Health (SDOH) Social History: SDOH Screenings   Food Insecurity: Food Insecurity Present (11/13/2023)  Housing: Low Risk  (11/13/2023)  Transportation Needs: No Transportation Needs (11/13/2023)  Utilities: Not At Risk (11/13/2023)  Depression (PHQ2-9): Low Risk  (11/08/2023)  Social Connections: Unknown (11/13/2023)  Tobacco Use: Low Risk  (11/13/2023)   SDOH Interventions: Food Insecurity Interventions: Patient Declined (Pt states that she receives Meels on Wheels)   Readmission Risk Interventions    11/14/2023   10:41 AM  Readmission Risk Prevention Plan  Post Dischage Appt Complete  Medication Screening Complete  Transportation Screening Complete

## 2023-11-14 NOTE — Progress Notes (Addendum)
 Transition of Care [facility]) - Inpatient Brief Assessment   Patient Details  Name: Jill Preston MRN: [id number] Date of Birth: 04-25-1933  Transition of Care [facility]) CM/SW Contact:    Janae Bridgeman, RN Phone Number: 11/14/2023, 3:02 PM   Clinical Narrative: Cm met with the patient and spouse at the bedside and patient states that she wants to return home with home health and not admit to SNF for rehabilitation services.  The patient states that she lives with her spouse at the home and has LTC policy that pays for home health nursing assistant at the home Monday- Friday from 9 am to 4 pm each week through [facility].  I called the patient's son and he confirms that the plan is for patient to return home with home health services when she is medically stable for discharge.  The patient was offered Medicare choice regarding home health services and patient states that she has had [facility] home [facility] in the past and would like them to return for services.  I called Amy, RNCM with [facility] and acceptance is currently pending at this time.  MD placed home health orders for PT/OT.  DME at the home includes 3:1, tub bench, Rolator, RW, and WC.  No other DME is needed.  Patient states that she has 3 sons and grandchildren and plans to have family provide transportation to home via car when stable for discharge.  11/14/2023 [facility] for home health PT, OT.   Transition of Care Asessment: Insurance and Status: Insurance coverage has been reviewed Patient has primary care physician: Yes Home environment has been reviewed: from home with spouse Prior level of function:: family assistance, RW/Rolator Prior/Current Home Services: Current home services (Has personal care Monday - Friday through [facility] from 9 am to 4 pm) Social Drivers of Health Review: SDOH reviewed needs interventions Readmission risk has been reviewed: Yes Transition of care needs:  transition of care needs identified, TOC will continue to follow

## 2023-11-15 DIAGNOSIS — R410 Disorientation, unspecified: Secondary | ICD-10-CM

## 2023-11-15 DIAGNOSIS — I5032 Chronic diastolic (congestive) heart failure: Secondary | ICD-10-CM

## 2023-11-15 DIAGNOSIS — I739 Peripheral vascular disease, unspecified: Secondary | ICD-10-CM

## 2023-11-15 DIAGNOSIS — I951 Orthostatic hypotension: Secondary | ICD-10-CM | POA: Diagnosis not present

## 2023-11-15 DIAGNOSIS — R296 Repeated falls: Secondary | ICD-10-CM | POA: Diagnosis not present

## 2023-11-15 DIAGNOSIS — R42 Dizziness and giddiness: Secondary | ICD-10-CM

## 2023-11-15 DIAGNOSIS — I25118 Atherosclerotic heart disease of native coronary artery with other forms of angina pectoris: Secondary | ICD-10-CM | POA: Diagnosis not present

## 2023-11-15 LAB — CBC
HCT: 41.6 % (ref 36.0–46.0)
Hemoglobin: 14 g/dL (ref 12.0–15.0)
MCH: 33.8 pg (ref 26.0–34.0)
MCHC: 33.7 g/dL (ref 30.0–36.0)
MCV: 100.5 fL — ABNORMAL HIGH (ref 80.0–100.0)
Platelets: 71 10*3/uL — ABNORMAL LOW (ref 150–400)
RBC: 4.14 MIL/uL (ref 3.87–5.11)
RDW: 13.9 % (ref 11.5–15.5)
WBC: 4.5 10*3/uL (ref 4.0–10.5)
nRBC: 0 % (ref 0.0–0.2)

## 2023-11-15 LAB — COMPREHENSIVE METABOLIC PANEL
ALT: 28 U/L (ref 0–44)
AST: 47 U/L — ABNORMAL HIGH (ref 15–41)
Albumin: 2.2 g/dL — ABNORMAL LOW (ref 3.5–5.0)
Alkaline Phosphatase: 66 U/L (ref 38–126)
Anion gap: 11 (ref 5–15)
BUN: 18 mg/dL (ref 8–23)
CO2: 29 mmol/L (ref 22–32)
Calcium: 9.1 mg/dL (ref 8.9–10.3)
Chloride: 95 mmol/L — ABNORMAL LOW (ref 98–111)
Creatinine, Ser: 0.83 mg/dL (ref 0.44–1.00)
GFR, Estimated: >60 mL/min (ref >60)
Glucose, Bld: 139 mg/dL — ABNORMAL HIGH (ref 70–99)
Potassium: 4.1 mmol/L (ref 3.5–5.1)
Sodium: 135 mmol/L (ref 135–145)
Total Bilirubin: 0.6 mg/dL (ref 0.0–1.2)
Total Protein: 5.3 g/dL — ABNORMAL LOW (ref 6.5–8.1)

## 2023-11-15 LAB — RPR: RPR Ser Ql: NONREACTIVE

## 2023-11-15 LAB — URINE CULTURE: Culture: >=100000 — AB

## 2023-11-15 LAB — AMMONIA: Ammonia: 27 umol/L (ref 9–35)

## 2023-11-15 LAB — MAGNESIUM: Magnesium: 1.7 mg/dL (ref 1.7–2.4)

## 2023-11-15 LAB — VITAMIN B12: Vitamin B-12: 2208 pg/mL — ABNORMAL HIGH (ref 180–914)

## 2023-11-15 LAB — FOLATE: Folate: 11.8 ng/mL (ref >5.9)

## 2023-11-15 MED ORDER — CEFADROXIL 500 MG PO CAPS
500.0000 mg | ORAL_CAPSULE | Freq: Two times a day (BID) | ORAL | Status: DC
  Administered 2023-11-15 – 2023-11-16 (×3): 500 mg via ORAL
  Filled 2023-11-15 (×3): qty 1

## 2023-11-15 MED ORDER — MAGNESIUM SULFATE 2 GM/50ML IV SOLN
2.0000 g | Freq: Once | INTRAVENOUS | Status: AC
  Administered 2023-11-15: 2 g via INTRAVENOUS
  Filled 2023-11-15: qty 50

## 2023-11-15 MED ORDER — MECLIZINE HCL 12.5 MG PO TABS
12.5000 mg | ORAL_TABLET | Freq: Three times a day (TID) | ORAL | Status: DC
  Administered 2023-11-15 – 2023-11-16 (×4): 12.5 mg via ORAL
  Filled 2023-11-15 (×6): qty 1

## 2023-11-15 NOTE — Progress Notes (Signed)
 Occupational Therapy Treatment Patient Details Name: Jill Preston MRN: [id number] DOB: [birth year] Today's Date: 11/15/2023   History of present illness Pt is 89 yo presenting to [facility] ED on 2/25 due to general weakness, lightheaded on standing and multiple falls. PMH: HTN, HLD, CAD, Chronic HFpEF, CKD III and hypothyroidism.   OT comments  Patient with progress towards goals working on ADL management and assessment of orthostatics. Patient complaining of significant headache on R side of face (medicated prior) and some double vision. Patient's vision assessed, but OT could not replicate double vision, with patient stating it waxes and wanes. Patient with stable BP, with significant pain when attempting on either arm. Patient completing ADLs sitting EOB, and highly distracted due to multiple phone calls. Of note, patient apparently called family early this morning, tearful and thinking she had had "two new mini-strokes" per patient's family member who was present at the end of the session (questionable delirium but patient appears otherwise cognitively intact). OT recommending HHOT for patient only if patient has 24/7 support at home. If patient does not have the support, she would benefit from a short rehab stint prior to returning home.       If plan is discharge home, recommend the following:  A little help with walking and/or transfers;A little help with bathing/dressing/bathroom;Assistance with cooking/housework;Assist for transportation;Help with stairs or ramp for entrance   Equipment Recommendations  None recommended by OT    Recommendations for Other Services      Precautions / Restrictions Precautions Precautions: Fall Restrictions Weight Bearing Restrictions Per Provider Order: No       Mobility Bed Mobility Overal bed mobility: Needs Assistance Bed Mobility: Supine to Sit, Sit to Supine     Supine to sit: Contact guard Sit to supine: Contact guard assist   General  bed mobility comments: CGA to rise to EOB, cues for technique, slow and effortful, denies dizziness. Effortful raising LEs into bed but did not require assistance, cues only for technique.    Transfers Overall transfer level: Needs assistance Equipment used: 1 person hand held assist Transfers: Sit to/from Stand Sit to Stand: Min assist           General transfer comment: Min A to come into standing with HHA     Balance Overall balance assessment: Needs assistance Sitting-balance support: No upper extremity supported, Feet supported Sitting balance-Leahy Scale: Fair Sitting balance - Comments: EOB stable   Standing balance support: During functional activity, Single extremity supported Standing balance-Leahy Scale: Poor Standing balance comment: e                           ADL either performed or assessed with clinical judgement   ADL Overall ADL's : Needs assistance/impaired;At baseline     Grooming: Wash/dry hands;Wash/dry face;Set up;Sitting                   Toilet Transfer: Minimal assistance Toilet Transfer Details (indicate cue type and reason): simulated with sit<>stand         Functional mobility during ADLs: Contact guard assist General ADL Comments: Patient with progress towards goals working on ADL management and assessment of orthostatics. Patient complaining of significant headache on R side of face (medicated prior) and some double vision. Patient's vision assessed, but OT could not replicate double vision, with patient stating it waxes and wanes. Patient with stable BP, with significant pain when attempting on either arm. Patient completing ADLs sitting EOB,  and highly distracted due to multiple phone calls. Of note, patient apparently called family early this morning, tearful and thinking she had had "two new mini-strokes" per patient's family member who was present at the end of the session (questionable delirium but patient appears  otherwise cognitively intact). OT recommending HHOT for patient only if patient has 24/7 support at home. If patient does not have the support, she would benefit from a short rehab stint prior to returning home.    Extremity/Trunk Assessment              Occupational psychologist Communication: Impaired Factors Affecting Communication: Hearing impaired   Cognition Arousal: Alert Behavior During Therapy: WFL for tasks assessed/performed Cognition: Difficult to assess             OT - Cognition Comments: Patient alert and appropriate, however per family patient called family early i the morning in a panic saying she had had two mini strokes, though MRI has been ordered or compelted                 Following commands: Intact        Cueing   Cueing Techniques: Verbal cues  Exercises      Shoulder Instructions       General Comments Pt and family report hx of vertigo; hx of treatment by PT for BPPV it sounds like. Epley and horiziontal roll tests performed after quick assessment and hx of symptoms (started after a fall last march, worse with position changes in bed., brief, vertigo, but also has been told a result of concussion.) Pt was postive for Rt posterior canalithiasis with <10 second upbeat rt rotational nystagmus + subjective vertigo. She declines treatment at this time but would like foollow up at home by HHPT.    Pertinent Vitals/ Pain       Pain Assessment Faces Pain Scale: Hurts little more Pain Location: pain in the R temporal and L occipital areas. Pt reports hitting her head when she fell. Pain Descriptors / Indicators: Aching  Home Living                                          Prior Functioning/Environment              Frequency  Min 2X/week        Progress Toward Goals  OT Goals(current goals can now be found in the care plan section)  Progress towards OT goals:  Progressing toward goals  Acute Rehab OT Goals Patient Stated Goal: to get better OT Goal Formulation: With patient/family Time For Goal Achievement: 11/28/23 Potential to Achieve Goals: Good  Plan      Co-evaluation                 AM-PAC OT "6 Clicks" Daily Activity     Outcome Measure   Help from another person eating meals?: None Help from another person taking care of personal grooming?: A Little Help from another person toileting, which includes using toliet, bedpan, or urinal?: A Little Help from another person bathing (including washing, rinsing, drying)?: A Little Help from another person to put on and taking off regular upper body clothing?: A Little Help from another person to put on and taking off regular lower body clothing?: A Little  6 Click Score: 19    End of Session    OT Visit Diagnosis: Unsteadiness on feet (R26.81);Other abnormalities of gait and mobility (R26.89);Muscle weakness (generalized) (M62.81);Pain Pain - Right/Left: Right Pain - part of body:  (Head)   Activity Tolerance Patient tolerated treatment well   Patient Left in bed;with call bell/phone within reach;with bed alarm set;with family/visitor present   Nurse Communication Mobility status        Time: 6962-9528 OT Time Calculation (min): 33 min  Charges: OT General Charges $OT Visit: 1 Visit OT Treatments $Self Care/Home Management : 23-37 mins  Pollyann Glen E. Kharizma Lesnick, OTR/L Acute Rehabilitation Services [phone]   Cherlyn Cushing 11/15/2023, 3:27 PM

## 2023-11-15 NOTE — Progress Notes (Addendum)
 Physical Therapy Treatment Patient Details Name: Jill Preston MRN: [id number] DOB: 04/14/1933 Today's Date: 11/15/2023   History of Present Illness Pt is 89 yo presenting to [facility] ED on 2/25 due to general weakness, lightheaded on standing and multiple falls. PMH: HTN, HLD, CAD, Chronic HFpEF, CKD III and hypothyroidism.    PT Comments  Tolerated treatment well. Denies dizziness with gait and mobility during session, able to ambulate and transfer from multiple surfaces at [facility]. Family present during assessment and help provide additional hx. Pt discussed issues with vertigo lying in be since last march when she hit her head, and reportedly had treatment for BPPV. After assessment, dixhallpike performed and was positive for right posterior canalithiasis (BPPV). Declined treatment but agreeable to have HHPT follow-up at d/c. Extensive education on safety, symptom awareness, AD use, and our recs will be for HHPT with 24/7 supervision as she has stated that can be arranged. Will follow and progress acutely. Patient will continue to benefit from skilled physical therapy services to further improve independence with functional mobility.    If plan is discharge home, recommend the following: A little help with walking and/or transfers;Assist for transportation;Help with stairs or ramp for entrance;Assistance with cooking/housework   Can travel by private vehicle     Yes  Equipment Recommendations  BSC/3in1    Recommendations for Other Services       Precautions / Restrictions Precautions Precautions: Fall Restrictions Weight Bearing Restrictions Per Provider Order: No     Mobility  Bed Mobility Overal bed mobility: Needs Assistance Bed Mobility: Supine to Sit, Sit to Supine     Supine to sit: Contact guard Sit to supine: Contact guard assist   General bed mobility comments: CGA to rise to EOB, cues for technique, slow and effortful, denies dizziness. Effortful raising LEs into bed  but did not require assistance, cues only for technique.    Transfers Overall transfer level: Needs assistance Equipment used: Rolling walker (2 wheels) Transfers: Sit to/from Stand Sit to Stand: Contact guard assist           General transfer comment: CGA for safety, cues for hand placement. Performed from bed and toilet. Both effortful but stabilizes once upright with RW support. Cues throughout.    Ambulation/Gait Ambulation/Gait assistance: Contact guard assist Gait Distance (Feet): 15 Feet (x2) Assistive device: Rolling walker (2 wheels) Gait Pattern/deviations: Step-through pattern, Decreased stride length       General Gait Details: Minor instability, no dizziness after standing for about 1 minute. Able to ambulate to restroom due to urinary urgency with RW for support. CGA for safety. No overt buckling. Cues for RW placement when navigating in congested area of restroom.   Stairs             Wheelchair Mobility     Tilt Bed    Modified Rankin (Stroke Patients Only)       Balance Overall balance assessment: Needs assistance Sitting-balance support: No upper extremity supported, Feet supported Sitting balance-Leahy Scale: Fair Sitting balance - Comments: EOB stable   Standing balance support: During functional activity, Single extremity supported Standing balance-Leahy Scale: Poor Standing balance comment: Held RW throughout session for support. Was able to hold bathroom rail while performing peri-care/hygiene                            Communication Communication Communication: Impaired Factors Affecting Communication: Hearing impaired  Cognition Arousal: Alert Behavior During Therapy: [facility] for tasks  assessed/performed   PT - Cognitive impairments: Problem solving, Safety/Judgement, Attention                       PT - Cognition Comments: history of concussion Following commands: Intact      Cueing Cueing Techniques:  Verbal cues  Exercises      General Comments General comments (skin integrity, edema, etc.): Pt and family report hx of vertigo; hx of treatment by PT for BPPV it sounds like. Epley and horiziontal roll tests performed after quick assessment and hx of symptoms (started after a fall last march, worse with position changes in bed., brief, vertigo, but also has been told a result of concussion.) Pt was postive for Rt posterior canalithiasis with <10 second upbeat rt rotational nystagmus + subjective vertigo. She declines treatment at this time but would like foollow up at home by HHPT.      Pertinent Vitals/Pain Pain Assessment Pain Assessment: Faces Faces Pain Scale: Hurts little more Pain Location: pain in the R temporal and L occipital areas. Pt reports hitting her head when she fell. Pain Descriptors / Indicators: Aching Pain Intervention(s): Monitored during session, Repositioned    Home Living                          Prior Function            PT Goals (current goals can now be found in the care plan section) Acute Rehab PT Goals Patient Stated Goal: to improve mobility, get pain out of head and feel less dizzy PT Goal Formulation: With patient Time For Goal Achievement: 11/27/23 Potential to Achieve Goals: Fair Progress towards PT goals: Progressing toward goals    Frequency    Min 1X/week      PT Plan      Co-evaluation              AM-PAC PT "6 Clicks" Mobility   Outcome Measure  Help needed turning from your back to your side while in a flat bed without using bedrails?: A Little Help needed moving from lying on your back to sitting on the side of a flat bed without using bedrails?: A Little Help needed moving to and from a bed to a chair (including a wheelchair)?: A Little Help needed standing up from a chair using your arms (e.g., wheelchair or bedside chair)?: A Little Help needed to walk in hospital room?: A Little Help needed climbing 3-5  steps with a railing? : A Lot 6 Click Score: 17    End of Session Equipment Utilized During Treatment: Gait belt Activity Tolerance: Patient tolerated treatment well Patient left: in bed;with call bell/phone within reach;with family/visitor present;with bed alarm set   PT Visit Diagnosis: Other abnormalities of gait and mobility (R26.89)     Time: 0981-1914 PT Time Calculation (min) (ACUTE ONLY): 36 min  Charges:    $Gait Training: 8-22 mins $Therapeutic Activity: 8-22 mins PT General Charges $$ ACUTE PT VISIT: 1 Visit                     Kathlyn Sacramento, PT, DPT [facility]  Rehabilitation Services Physical Therapist Office: [phone] Website: [email or web address]    Berton Mount 11/15/2023, 1:33 PM

## 2023-11-15 NOTE — Progress Notes (Signed)
 PROGRESS NOTE    Jill Preston  UJW:[id number] DOB: 1933-03-15 DOA: 11/12/2023 PCP: [name], NP    Chief Complaint  Patient presents with   Weakness    Brief Narrative:  Patient pleasant 89 year old female history of hypertension, hyperlipidemia, CAD, chronic HFpEF, CKD 3A, hypothyroidism presented generalized weakness, lightheadedness on standing with multiple falls.  Patient noted to have systolic blood pressures in the 90s on presentation.  Patient also with complaints of suprapubic pain, urinalysis concerning for UTI.  Patient placed on IV antibiotics, IV fluids, supportive care.   Assessment & Plan:   Principal Problem:   Orthostatic hypotension Active Problems:   Frequent falls   CAD - LAD stent 12/00. Patent '02 and 1/13, moderate D2 managed medically   Essential hypertension   Chronic diastolic CHF (congestive heart failure) (HCC)   PAD (peripheral artery disease) (HCC)   Asthma   Thrombocytopenia (HCC)   Hypokalemia   Hyponatremia   Acute cystitis   Hypomagnesemia   Vertigo   Confusion  #1 orthostatic hypotension -Patient presenting with generalized weakness, lightheadedness with positional changes, with recurrent falls noted to be on diuretics of Demadex as well as Imdur and Norvasc prior to admission. -Patient noted with recent poor oral intake. -Patient with elevated troponins however flattened. -Improved with hydration.   -Continue to hold Imdur, Norvasc, Demadex.   2.  Frequent falls -Likely secondary to orthostatic hypotension in the setting of UTI and electrolyte abnormalities. -Patient hydrated with IV fluids.   -Electrolytes repleted.   -Patient seen by PT and patient will have home health set up on discharge. -Also need PT vestibular eval.  3.  E. coli UTI -Urine cultures with > 100,000 colonies of E. Coli and pansensitive. -Change IV Rocephin to oral cefadroxil to complete a 5-7-day course of treatment.   4.  Elevated  troponins -Troponins elevated but flat in, patient asymptomatic denies any chest pain or significant shortness of breath. -2D echo obtained with a EF of 55 to 60%,NWMA, grade 2 DD, mild to moderately dilated left atrial size, mild MVR. -Supportive care.  5.  Hypokalemia/hypomagnesemia -Potassium noted at 3.1 on presentation with repeat at 2.7. Magnesium at 1.6. -Patient noted to be on diuretics prior to admission. -Electrolytes repleted, potassium of 4.1 this morning, magnesium at 1.7.  -Magnesium sulfate 2 g IV x 1. -Repeat labs in the AM.  6.  Hypothyroidism -Continue Synthroid.   7.  Hyponatremia -Likely secondary to hypovolemic hyponatremia. -Resolved with hydration.   8.  Hypertension -Patient noted to have systolic blood pressures in the 90s on presentation, noted to have orthostatic hypotension. -BP improved with hydration. -Continue to hold antihypertensive medications.   9.  CKD stage IIIa -Stable.  10.  CAD -Stable. -Asymptomatic. -Cardiac enzymes noted to be elevated but flattened. -2D echo obtained with a EF of 55 to 60%,NWMA, grade 2 DD, mild to moderately dilated left atrial size, mild MVR. -Continue to hold cardiac medications as patient had presented with orthostatic hypotension.  -May need to resume cardiac medications on discharge at half home dose and resume 2 to 3 days postdischarge.  11.  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 -Patient with no overt bleeding. -Platelet count improving daily.  12.  Vertigo/BPPV -Patient with complaints of spinning sensation when laying down. -Patient endorses history of vertigo and noted to be on meclizine as needed per med rec. -Patient assessed by PT and was positive for BPPV. -Placed on meclizine 12.5 mg p.o. 3 times daily  scheduled. -Will need home health PT for vestibular.  13.  Confusion -Per niece patient noted to have bouts of confusion overnight and was calling  family members. -Patient noted to have head CT done on admission which was negative for any acute abnormalities. -Patient with no focal neurological deficits. -Check a vitamin B12, ammonia, folate, RPR. -TSH done on 09/20/2023 within normal limits at 1.44. -Follow.    DVT prophylaxis: SCDs Code Status: DNR limited  Family Communication: Updated patient and niece at bedside. Disposition: Home with home health hopefully in the next 24 hours if improved clinically.  Status is: Inpatient The patient will require care spanning > 2 midnights and should be moved to inpatient because: Severity of illness   Consultants:  None  Procedures:  CT Head 11/12/2023 Chest x-ray 11/12/2023 2D echo 11/13/2023  Antimicrobials:  Anti-infectives (From admission, onward)    Start     Dose/Rate Route Frequency Ordered Stop   11/15/23 1100  cefadroxil (DURICEF) capsule 500 mg        500 mg Oral 2 times daily 11/15/23 1014 11/19/23 0959   11/14/23 2200  cefTRIAXone (ROCEPHIN) 1 g in sodium chloride 0.9 % 100 mL IVPB  Status:  Discontinued        1 g 200 mL/hr over 30 Minutes Intravenous Every 24 hours 11/14/23 0648 11/15/23 1014   11/13/23 2200  cefTRIAXone (ROCEPHIN) 1 g in sodium chloride 0.9 % 100 mL IVPB  Status:  Discontinued        1 g 200 mL/hr over 30 Minutes Intravenous Every 24 hours 11/12/23 2156 11/13/23 1504   11/13/23 2200  cefTRIAXone (ROCEPHIN) 2 g in sodium chloride 0.9 % 100 mL IVPB  Status:  Discontinued        2 g 200 mL/hr over 30 Minutes Intravenous Every 24 hours 11/13/23 1504 11/14/23 0648   11/12/23 2200  cefTRIAXone (ROCEPHIN) 1 g in sodium chloride 0.9 % 100 mL IVPB        1 g 200 mL/hr over 30 Minutes Intravenous  Once 11/12/23 2146 11/13/23 0243         Subjective: Patient laying in bed.  Denies any chest pain or shortness of breath.  States abdominal pain has improved.  Patient with complaints of spinning sensation when she lays flat in the bed.  Patient states does  have a history of vertigo.  Patient with no focal neurological deficits.  Niece at bedside.  Per niece patient with some bouts of confusion overnight.  Objective: Vitals:   11/14/23 2003 11/15/23 0510 11/15/23 0813 11/15/23 1245  BP: 139/63 131/61 (!) 119/49 133/63  Pulse: 72 66 65 (!) 59  Resp: 18 17 19 18   Temp: 98.3 F (36.8 C) 98.6 F (37 C) 98.6 F (37 C) 98.3 F (36.8 C)  TempSrc: Oral Oral Oral Oral  SpO2: 90% 93% 95% 91%  Weight:      Height:        Intake/Output Summary (Last 24 hours) at 11/15/2023 1440 Last data filed at 11/14/2023 2156 Gross per 24 hour  Intake 100 ml  Output --  Net 100 ml   Filed Weights   11/12/23 1348  Weight: 77.1 kg    Examination:  General exam: NAD.  Respiratory system: CTAB.  No wheezes, no crackles, no rhonchi.  Fair air movement.  Speaking in full sentences.  Cardiovascular system.  Regular rate rhythm no murmurs rubs or gallops.  No JVD.  No lower extremity edema.  Gastrointestinal system: Abdomen is soft,  nontender, nondistended, positive bowel sounds.  No rebound.  No guarding.  Central nervous system: Alert and oriented. No focal neurological deficits. Extremities: Symmetric 5 x 5 power. Skin: No rashes, lesions or ulcers Psychiatry: Judgement and insight appear normal. Mood & affect appropriate.     Data Reviewed: I have personally reviewed following labs and imaging studies  CBC: Recent Labs  Lab 11/12/23 1407 11/13/23 0651 11/14/23 0703 11/15/23 0738  WBC 5.0 3.5* 3.5* 4.5  NEUTROABS 3.6  --   --   --   HGB 15.1* 13.8 13.6 14.0  HCT 43.6 39.9 40.5 41.6  MCV 96.0 97.3 98.5 100.5*  PLT 56* 44* 54* 71*    Basic Metabolic Panel: Recent Labs  Lab 11/12/23 1407 11/13/23 0651 11/14/23 0703 11/15/23 0738  NA 128* 132* 136 135  K 3.1* 2.7* 4.3 4.1  CL 84* 92* 97* 95*  CO2 29 30 30 29   GLUCOSE 106* 106* 105* 139*  BUN 21 21 20 18   CREATININE 1.01* 0.95 0.73 0.83  CALCIUM 8.5* 8.2* 9.1 9.1  MG  --  1.6* 1.9  1.7    GFR: Estimated Creatinine Clearance: 48.1 mL/min (by C-G formula based on SCr of 0.83 mg/dL).  Liver Function Tests: Recent Labs  Lab 11/12/23 1407 11/13/23 0651 11/14/23 0703 11/15/23 0738  AST 92* 60* 50* 47*  ALT 36 28 28 28   ALKPHOS 70 59 66 66  BILITOT 1.7* 0.9 0.6 0.6  PROT 5.5* 5.0* 4.9* 5.3*  ALBUMIN 2.5* 2.2* 2.1* 2.2*    CBG: No results for input(s): "GLUCAP" in the last 168 hours.   Recent Results (from the past 240 hours)  Resp panel by RT-PCR (RSV, Flu A&B, Covid) Anterior Nasal Swab     Status: None   Collection Time: 11/12/23  2:08 PM   Specimen: Anterior Nasal Swab  Result Value Ref Range Status   SARS Coronavirus 2 by RT PCR NEGATIVE NEGATIVE Final   Influenza A by PCR NEGATIVE NEGATIVE Final   Influenza B by PCR NEGATIVE NEGATIVE Final    Comment: (NOTE) The Xpert Xpress SARS-CoV-2/FLU/RSV plus assay is intended as an aid in the diagnosis of influenza from Nasopharyngeal swab specimens and should not be used as a sole basis for treatment. Nasal washings and aspirates are unacceptable for Xpert Xpress SARS-CoV-2/FLU/RSV testing.  Fact Sheet for Patients: BloggerCourse.com  Fact Sheet for Healthcare Providers: SeriousBroker.it  This test is not yet approved or cleared by the [place] FDA and has been authorized for detection and/or diagnosis of SARS-CoV-2 by FDA under an Emergency Use Authorization (EUA). This EUA will remain in effect (meaning this test can be used) for the duration of the COVID-19 declaration under Section 564(b)(1) of the Act, 21 U.S.C. section 360bbb-3(b)(1), unless the authorization is terminated or revoked.     Resp Syncytial Virus by PCR NEGATIVE NEGATIVE Final    Comment: (NOTE) Fact Sheet for Patients: BloggerCourse.com  Fact Sheet for Healthcare Providers: SeriousBroker.it  This test is not yet approved  or cleared by the [place] FDA and has been authorized for detection and/or diagnosis of SARS-CoV-2 by FDA under an Emergency Use Authorization (EUA). This EUA will remain in effect (meaning this test can be used) for the duration of the COVID-19 declaration under Section 564(b)(1) of the Act, 21 U.S.C. section 360bbb-3(b)(1), unless the authorization is terminated or revoked.  Performed at [facility], [address], [place], [place] [address]   Urine Culture     Status: Abnormal  Collection Time: 11/12/23  8:36 PM   Specimen: Urine, Clean Catch  Result Value Ref Range Status   Specimen Description URINE, CLEAN CATCH  Final   Special Requests   Final    Immunocompromised Performed at [facility], [address], [place], [place] [address]    Culture >=100,000 COLONIES/mL ESCHERICHIA COLI (A)  Final   Report Status 11/15/2023 FINAL  Final   Organism ID, Bacteria ESCHERICHIA COLI (A)  Final      Susceptibility   Escherichia coli - MIC*    AMPICILLIN <=2 SENSITIVE Sensitive     CEFAZOLIN <=4 SENSITIVE Sensitive     CEFEPIME <=0.12 SENSITIVE Sensitive     CEFTRIAXONE <=0.25 SENSITIVE Sensitive     CIPROFLOXACIN <=0.25 SENSITIVE Sensitive     GENTAMICIN <=1 SENSITIVE Sensitive     IMIPENEM <=0.25 SENSITIVE Sensitive     NITROFURANTOIN <=16 SENSITIVE Sensitive     TRIMETH/SULFA <=20 SENSITIVE Sensitive     AMPICILLIN/SULBACTAM <=2 SENSITIVE Sensitive     PIP/TAZO <=4 SENSITIVE Sensitive ug/mL    * >=100,000 COLONIES/mL ESCHERICHIA COLI         Radiology Studies: No results found.       Scheduled Meds:  cefadroxil  500 mg Oral BID   cyanocobalamin  1,000 mcg Oral Daily   ezetimibe  10 mg Oral q morning   feeding supplement  474 mL Oral BID   latanoprost  1 drop Both Eyes QHS   levothyroxine  75 mcg Oral QAC breakfast   meclizine  12.5 mg Oral TID   pravastatin  80 mg Oral QHS   sertraline  25 mg Oral Daily   sodium chloride flush  3 mL  Intravenous Q12H   Continuous Infusions:     LOS: 2 days    Time spent: 35 minutes    Ramiro Harvest, MD [facility]   To contact the attending provider between 7A-7P or the covering provider during after hours 7P-7A, please log into the web site [email or web address] and access using universal [place] password for that web site. If you do not have the password, please call the hospital operator.  11/15/2023, 2:40 PM

## 2023-11-15 NOTE — TOC Progression Note (Signed)
 Transition of Care [facility]) - Progression Note    Patient Details  Name: Jill Preston MRN: [id number] Date of Birth: 07/07/1933  [facility] CM/SW Contact  Janae Bridgeman, RN Phone Number: 11/15/2023, 2:45 PM  Clinical Narrative:    CM spoke with PT and vestibular PT is recommended in the home due to patient's vertigo.  MD added Meclizine today and I called Amy, RNCM with [facility] and she will send the recent PT note to the follow up therapist in the community and [facility] plans to provide Outpatient vestibular PT in the home when patient returns.   Expected Discharge Plan: Home w Home Health Services Barriers to Discharge: Continued Medical Work up  Expected Discharge Plan and Services In-house Referral: Clinical Social Work     Living arrangements for the past 2 months: Single Family Home                                       Social Determinants of Health (SDOH) Interventions SDOH Screenings   Food Insecurity: Food Insecurity Present (11/13/2023)  Housing: Low Risk  (11/13/2023)  Transportation Needs: No Transportation Needs (11/13/2023)  Utilities: Not At Risk (11/13/2023)  Depression (PHQ2-9): Low Risk  (11/08/2023)  Social Connections: Unknown (11/13/2023)  Tobacco Use: Low Risk  (11/13/2023)    Readmission Risk Interventions    11/14/2023    2:56 PM 11/14/2023   10:41 AM  Readmission Risk Prevention Plan  Post Dischage Appt Complete Complete  Medication Screening Complete Complete  Transportation Screening Complete Complete

## 2023-11-15 NOTE — Plan of Care (Signed)

## 2023-11-15 NOTE — Care Management Important Message (Signed)
 Important Message  Patient Details  Name: Jill Preston MRN: [id number] Date of Birth: [birth year]   Important Message Given:  Yes - Medicare IM     Dorena Bodo 11/15/2023, 3:24 PM

## 2023-11-16 ENCOUNTER — Other Ambulatory Visit (HOSPITAL_COMMUNITY): Payer: Self-pay

## 2023-11-16 DIAGNOSIS — I951 Orthostatic hypotension: Secondary | ICD-10-CM | POA: Diagnosis not present

## 2023-11-16 DIAGNOSIS — I25118 Atherosclerotic heart disease of native coronary artery with other forms of angina pectoris: Secondary | ICD-10-CM | POA: Diagnosis not present

## 2023-11-16 DIAGNOSIS — E876 Hypokalemia: Secondary | ICD-10-CM | POA: Diagnosis not present

## 2023-11-16 DIAGNOSIS — D696 Thrombocytopenia, unspecified: Secondary | ICD-10-CM | POA: Diagnosis not present

## 2023-11-16 LAB — BASIC METABOLIC PANEL
Anion gap: 10 (ref 5–15)
BUN: 18 mg/dL (ref 8–23)
CO2: 30 mmol/L (ref 22–32)
Calcium: 9.5 mg/dL (ref 8.9–10.3)
Chloride: 98 mmol/L (ref 98–111)
Creatinine, Ser: 0.86 mg/dL (ref 0.44–1.00)
GFR, Estimated: >60 mL/min (ref >60)
Glucose, Bld: 204 mg/dL — ABNORMAL HIGH (ref 70–99)
Potassium: 4.7 mmol/L (ref 3.5–5.1)
Sodium: 138 mmol/L (ref 135–145)

## 2023-11-16 LAB — CBC
HCT: 42.1 % (ref 36.0–46.0)
Hemoglobin: 14.3 g/dL (ref 12.0–15.0)
MCH: 33.8 pg (ref 26.0–34.0)
MCHC: 34 g/dL (ref 30.0–36.0)
MCV: 99.5 fL (ref 80.0–100.0)
Platelets: 85 10*3/uL — ABNORMAL LOW (ref 150–400)
RBC: 4.23 MIL/uL (ref 3.87–5.11)
RDW: 13.9 % (ref 11.5–15.5)
WBC: 4.7 10*3/uL (ref 4.0–10.5)
nRBC: 0 % (ref 0.0–0.2)

## 2023-11-16 LAB — GLUCOSE, CAPILLARY
Glucose-Capillary: 133 mg/dL — ABNORMAL HIGH (ref 70–99)
Glucose-Capillary: 136 mg/dL — ABNORMAL HIGH (ref 70–99)

## 2023-11-16 LAB — MAGNESIUM: Magnesium: 1.7 mg/dL (ref 1.7–2.4)

## 2023-11-16 MED ORDER — POTASSIUM CHLORIDE CRYS ER 20 MEQ PO TBCR: 20.0000 meq | EXTENDED_RELEASE_TABLET | Freq: Two times a day (BID) | ORAL | Status: DC

## 2023-11-16 MED ORDER — MECLIZINE HCL 12.5 MG PO TABS
ORAL_TABLET | ORAL | 0 refills | Status: DC
  Filled 2023-11-16: qty 30, 10d supply, fill #0

## 2023-11-16 MED ORDER — ISOSORBIDE MONONITRATE ER 30 MG PO TB24: 15.0000 mg | ORAL_TABLET | Freq: Every day | ORAL | Status: DC

## 2023-11-16 MED ORDER — TORSEMIDE 20 MG PO TABS
20.0000 mg | ORAL_TABLET | Freq: Every day | ORAL | 0 refills | Status: DC
  Filled 2023-11-16: qty 30, 30d supply, fill #0

## 2023-11-16 MED ORDER — CEFADROXIL 500 MG PO CAPS
500.0000 mg | ORAL_CAPSULE | Freq: Two times a day (BID) | ORAL | 0 refills | Status: AC
Start: 2023-11-16 — End: 2023-11-19
  Filled 2023-11-16: qty 6, 3d supply, fill #0

## 2023-11-16 MED ORDER — AMLODIPINE BESYLATE 2.5 MG PO TABS: 2.5000 mg | ORAL_TABLET | Freq: Every day | ORAL | Status: DC

## 2023-11-16 NOTE — Plan of Care (Signed)

## 2023-11-16 NOTE — Progress Notes (Signed)
 Mobility Specialist: Progress Note   11/16/23 1238  Mobility  Activity Ambulated with assistance in room  Level of Assistance Contact guard assist, steadying assist  Assistive Device Front wheel walker  Distance Ambulated (ft) 40 ft  Activity Response Tolerated well  Mobility Referral Yes  Mobility visit 1 Mobility  Mobility Specialist Start Time (ACUTE ONLY) 0909  Mobility Specialist Stop Time (ACUTE ONLY) 0924  Mobility Specialist Time Calculation (min) (ACUTE ONLY) 15 min    Sitting EOB: BP 108/83 Standing: BP 96/54  Pt was agreeable to mobility session - received in bed. MinA for bed mobility to assist with trunk elevation. Light minA for STS, CG for ambulation. No complaints of pain, dizziness, or lightheadedness. Ambulated to the room door then to the chair. Left in chair with all needs met, call bell in reach.   Maurene Capes Mobility Specialist Please contact via SecureChat or Rehab office at [phone]

## 2023-11-16 NOTE — Discharge Summary (Addendum)
 Physician Discharge Summary  Jill Preston:[id number] DOB: 08/04/1933 DOA: 11/12/2023  PCP: Jill Bookman, NP  Admit date: 11/12/2023 Discharge date: 11/16/2023  Time spent: 60 minutes  Recommendations for Outpatient Follow-up:  Follow-up with [name], [name], NP in 2 weeks.  On follow-up patient need a basic metabolic profile, magnesium level checked to follow-up on electrolytes and renal function.  Patient needs CBC done to follow-up on counts.  Patient's blood pressure need to be reassessed on follow-up. Follow-up with Dr. Royann Preston, cardiology in 1 week for further reassessment and management of cardiac medications and doses adjusted/decreased during the hospitalization.   Discharge Diagnoses:  Principal Problem:   Orthostatic hypotension Active Problems:   Frequent falls   CAD - LAD stent 12/00. Patent '02 and 1/13, moderate D2 managed medically   Essential hypertension   Chronic diastolic CHF (congestive heart failure) (HCC)   PAD (peripheral artery disease) (HCC)   Asthma   Thrombocytopenia (HCC)   Hypokalemia   Hyponatremia   Acute cystitis   Hypomagnesemia   Vertigo   Confusion   Discharge Condition: Stable and improved.  Diet recommendation: Heart healthy  Filed Weights   11/12/23 1348 11/16/23 0750  Weight: 77.1 kg 85.5 kg    History of present illness:  HPI per Dr. Darliss Preston is a 89 y.o. female with medical history significant for hypertension, hyperlipidemia, CAD, chronic HFpEF, CKD 3A, and hypothyroidism, now presenting with generalized weakness, lightheadedness on standing, and multiple falls related to this.   Patient reportedly had an upper respiratory illness that she has recovered from for the most part, but has continued to experience poor appetite, and has grown progressively weak in general, is now lightheaded whenever she sits up or stands, and has had multiple falls due to this.  She is very tender in the suprapubic area and  wonders whether she has a bladder infection.    Patient feels well while supine, but becomes lightheaded when she sits up or stands and sometimes feels as though she is about to pass out.  She reports nearly passing out when she stood up with the EMS just prior to arrival in the ED.  There has not been any chest pain or palpitations and no focal numbness or weakness.   ED Course: Upon arrival to the ED, patient is found to be afebrile and saturating low 90s on room air with systolic blood pressure in the 90s.  Labs are most notable for sodium 128, potassium 3.1, creatinine 1.01, albumin 2.5, AST 92, normal WBC, platelets 56,000, normal lactic acid, and negative respiratory virus panel.   Patient was given 500 mL of normal saline, 40 mEq potassium, and 1 g IV Rocephin in the ED.  Hospital Course:  #1 orthostatic hypotension -Patient presented with generalized weakness, lightheadedness with positional changes, with recurrent falls noted to be on diuretics of Demadex as well as Imdur and Norvasc prior to admission. -Patient noted with recent poor oral intake. -Patient with elevated troponins however flattened. -Improved with hydration.   -Patient's Imdur, Norvasc and Demadex were held during the hospitalization and will be resumed at half home dose 3 days postdischarge.   -Orthostatic hypotension resolved by day of discharge.     2.  Frequent falls -Likely secondary to orthostatic hypotension in the setting of UTI and electrolyte abnormalities. -Patient hydrated with IV fluids.   -Electrolytes repleted.   -Patient seen by PT and patient will have home health set up on discharge. -Also need PT vestibular  eval.   3.  E. coli UTI -Urine cultures with > 100,000 colonies of E. Coli and pansensitive. -Patient initially placed on IV Rocephin and transition to cefadroxil and be discharged on 3 more days of oral cefadroxil to complete a 7-day course of treatment.    4.  Elevated troponins -Troponins  elevated but flat in, patient asymptomatic denies any chest pain or significant shortness of breath. -2D echo obtained with a EF of 55 to 60%,NWMA, grade 2 DD, mild to moderately dilated left atrial size, mild MVR. -Supportive care. -Outpatient follow-up with primary cardiologist.   5.  Hypokalemia/hypomagnesemia -Potassium noted at 3.1 on presentation with repeat at 2.7. Magnesium at 1.6. -Patient noted to be on diuretics prior to admission. -Electrolytes repleted during the hospitalization. -Outpatient follow-up.   6.  Hypothyroidism -Patient maintained on home regimen Synthroid.     7.  Hyponatremia -Likely secondary to hypovolemic hyponatremia. -Resolved with hydration.    8.  Hypertension -Patient noted to have systolic blood pressures in the 90s on presentation, noted to have orthostatic hypotension. -BP improved with hydration. -Patient's antihypertensive medications were held.   -Patient Imdur will be resumed at half home dose of 15 mg daily, torsemide 20 mg daily 3 days postdischarge.   -Outpatient follow-up with cardiology and PCP.    9.  CKD stage IIIa -Stable.   10.  CAD -Stable. -Asymptomatic. -Cardiac enzymes noted to be elevated but flattened. -2D echo obtained with a EF of 55 to 60%,NWMA, grade 2 DD, mild to moderately dilated left atrial size, mild MVR. -Patient's cardiac medications were held during the hospitalization that patient had presented with orthostatic hypotension.   -Cardiac medications will be resumed 3 days postdischarge at half home dose.  Patient's Imdur will be decreased to 15 mg daily and torsemide decreased to 20 mg daily.  Patient's Norvasc will be resumed at 2.5 mg daily.   -Close outpatient follow-up with primary cardiologist for further medication management and evaluation.     11.  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 -Patient with no overt bleeding. -Platelet count improving  daily. -Outpatient follow-up with PCP   12.  Vertigo/BPPV -Patient with complaints of spinning sensation when laying down. -Patient endorses history of vertigo and noted to be on meclizine as needed per med rec. -Patient assessed by PT and was positive for BPPV. -Placed on meclizine 12.5 mg p.o. 3 times daily scheduled. -Home health PT for vestibular evaluation ordered.   -Patient improved clinically by day of discharge will be discharged home on meclizine 12.5 mg p.o. 3 times daily x 5 days and then as needed.   -Outpatient follow-up with PCP.    13.  Confusion -Per niece patient noted to have bouts of confusion in the evening of 11/14/2023 and was calling family members.  -Patient noted to have head CT done on admission which was negative for any acute abnormalities. -Patient with no focal neurological deficits. -Vitamin B12 levels within normal limits, ammonia levels within normal limits, folate levels within normal limits, RPR nonreactive.  -TSH done on 09/20/2023 within normal limits at 1.44. -Patient improved clinically and was back to baseline by day of discharge.      Procedures: CT Head 11/12/2023 Chest x-ray 11/12/2023 2D echo 11/13/2023  Consultations: None  Discharge Exam: Vitals:   11/15/23 1912 11/16/23 0445  BP: 138/66 105/76  Pulse: 66 62  Resp: 16 18  Temp: 98.5 F (36.9 Preston) 98 F (36.7 Preston)  SpO2: 92% 93%  General: NAD Cardiovascular: RRR no murmurs rubs or gallops.  No JVD.  No lower extremity edema. Respiratory: Clear to auscultation bilaterally.  No wheezes, no crackles, no rhonchi.  Fair air movement.  Discharge Instructions   Discharge Instructions     Diet - low sodium heart healthy   Complete by: As directed    Increase activity slowly   Complete by: As directed       Allergies as of 11/16/2023       Reactions   Atorvastatin Anaphylaxis, Other (See Comments)   Patient does not remember; caused pneumonia- took her off of it per patient    Penicillins Anaphylaxis, Hives, Swelling, Other (See Comments)   Tongue swelling Did it involve swelling of the face/tongue/throat, SOB, or low BP? Yes Did it involve sudden or severe rash/hives, skin peeling, or any reaction on the inside of your mouth or nose? Yes Did you need to seek medical attention at a hospital or doctor's office? Yes When did it last happen?  71 or 89 years old    If all above answers are "NO", may proceed with cephalosporin use.   Shellfish Allergy Anaphylaxis, Nausea And Vomiting, Other (See Comments)   Severe nausea and vomiting   Aminophylline Itching, Swelling, Rash, Other (See Comments)   Pt experienced burning urination, itching, redness/rash, and swelling of genitals after given this medication via IV push.   Iodinated Contrast Media Swelling, Other (See Comments)   FLUSHING, also; LLE became swollen   Latex Other (See Comments)   Causes blisters   Oxybutynin Chloride Other (See Comments)   "blisters"   Propoxyphene Other (See Comments)   Unknown reaction   Vancomycin Hives, Other (See Comments)   06/02/2018- Hives arm, back and chest   Adhesive [tape] Rash, Other (See Comments)   Paper tape is ok   Betadine [povidone Iodine] Rash   Ciprofloxacin Hives   Codeine Nausea And Vomiting   .   Ranolazine Other (See Comments)   Constipation .        Medication List     TAKE these medications    amLODipine 2.5 MG tablet Commonly known as: NORVASC Take 1 tablet (2.5 mg total) by mouth daily. Start taking on: November 19, 2023 What changed: These instructions start on November 19, 2023. If you are unsure what to do until then, ask your doctor or other care provider.   cefadroxil 500 MG capsule Commonly known as: DURICEF Take 1 capsule (500 mg total) by mouth 2 (two) times daily for 3 days.   cyanocobalamin 1000 MCG tablet Commonly known as: VITAMIN B12 Take 1 tablet (1,000 mcg total) by mouth daily.   Ensure High Protein Liqd Take 1 Can by mouth  daily. What changed:  how much to take when to take this   ezetimibe 10 MG tablet Commonly known as: ZETIA Take 1 tablet (10 mg total) by mouth every morning.   gabapentin 300 MG capsule Commonly known as: NEURONTIN Take 1 capsule (300 mg total) by mouth at bedtime.   isosorbide mononitrate 30 MG 24 hr tablet Commonly known as: IMDUR Take 0.5 tablets (15 mg total) by mouth daily. Start taking on: November 19, 2023 What changed:  how much to take These instructions start on November 19, 2023. If you are unsure what to do until then, ask your doctor or other care provider.   latanoprost 0.005 % ophthalmic solution Commonly known as: XALATAN Place 1 drop into both eyes at bedtime.   levothyroxine 75 MCG  tablet Commonly known as: SYNTHROID Take 1 tablet (75 mcg total) by mouth daily before breakfast.   meclizine 12.5 MG tablet Commonly known as: ANTIVERT Take 1 tablet (12.5 mg total) by mouth 3 (three) times daily for 5 days, THEN 1 tablet (12.5 mg total) 3 (three) times daily as needed. Start taking on: November 16, 2023 What changed: See the new instructions.   nystatin cream Commonly known as: MYCOSTATIN APPLY 1 APPLICATION TOPICALLY TWICE DAILY *REFILL REQUEST* What changed: See the new instructions.   OVER THE COUNTER MEDICATION Take 1 capsule by mouth daily. OmegaXL joint and muscle support   potassium chloride SA 20 MEQ tablet Commonly known as: KLOR-CON M Take 1 tablet (20 mEq total) by mouth 2 (two) times daily. Start taking on: November 19, 2023 What changed: These instructions start on November 19, 2023. If you are unsure what to do until then, ask your doctor or other care provider.   pravastatin 80 MG tablet Commonly known as: PRAVACHOL Take 1 tablet (80 mg total) by mouth at bedtime.   sertraline 25 MG tablet Commonly known as: ZOLOFT Take 1 tablet (25 mg total) by mouth daily.   torsemide 20 MG tablet Commonly known as: DEMADEX Take 1 tablet (20 mg total) by mouth  daily. Start taking on: November 19, 2023 What changed:  medication strength how much to take how to take this when to take this additional instructions These instructions start on November 19, 2023. If you are unsure what to do until then, ask your doctor or other care provider.               Durable Medical Equipment  (From admission, onward)           Start     Ordered   11/14/23 1032  For home use only DME 3 n 1  Once        11/14/23 1031           Allergies  Allergen Reactions   Atorvastatin Anaphylaxis and Other (See Comments)    Patient does not remember; caused pneumonia- took her off of it per patient   Penicillins Anaphylaxis, Hives, Swelling and Other (See Comments)    Tongue swelling Did it involve swelling of the face/tongue/throat, SOB, or low BP? Yes Did it involve sudden or severe rash/hives, skin peeling, or any reaction on the inside of your mouth or nose? Yes Did you need to seek medical attention at a hospital or doctor's office? Yes When did it last happen?  77 or 89 years old    If all above answers are "NO", may proceed with cephalosporin use.    Shellfish Allergy Anaphylaxis, Nausea And Vomiting and Other (See Comments)    Severe nausea and vomiting   Aminophylline Itching, Swelling, Rash and Other (See Comments)    Pt experienced burning urination, itching, redness/rash, and swelling of genitals after given this medication via IV push.   Iodinated Contrast Media Swelling and Other (See Comments)    FLUSHING, also; LLE became swollen   Latex Other (See Comments)    Causes blisters   Oxybutynin Chloride Other (See Comments)    "blisters"   Propoxyphene Other (See Comments)    Unknown reaction   Vancomycin Hives and Other (See Comments)    06/02/2018- Hives arm, back and chest   Adhesive [Tape] Rash and Other (See Comments)    Paper tape is ok   Betadine [Povidone Iodine] Rash   Ciprofloxacin Hives   Codeine  Nausea And Vomiting    .    Ranolazine Other (See Comments)    Constipation  .    Follow-up Information     [facility].   Why: [facility] will be providing home health services.  They will start services in the next 24-48 hours. Contact information: [address] [place] [address] [phone]         [name], [name], NP. Schedule an appointment as soon as possible for a visit in 2 week(s).   Specialty: Family Medicine Contact information: [address] [place] [address] [phone]         Croitoru, Rachelle Hora, MD. Schedule an appointment as soon as possible for a visit in 1 week(s).   Specialty: Cardiology Why: Reassess medications and for medication adjustment and management Contact information: [address] [place] [address] [phone]                  The results of significant diagnostics from this hospitalization (including imaging, microbiology, ancillary and laboratory) are listed below for reference.    Significant Diagnostic Studies: ECHOCARDIOGRAM COMPLETE Result Date: 11/13/2023    ECHOCARDIOGRAM REPORT   Patient Name:   Jill Preston Date of Exam: 11/13/2023 Medical Rec #:  [id number]         Height:       65.0 in Accession #:    [id number]        Weight:       170.0 lb Date of Birth:  05/31/1933        BSA:          1.846 m Patient Age:    89 years          BP:           111/57 mmHg Patient Gender: F                 HR:           59 bpm. Exam Location:  Inpatient Procedure: 2D Echo, Color Doppler and Cardiac Doppler (Both Spectral and Color            Flow Doppler were utilized during procedure). Indications:    Elevated Troponins  History:        Patient has prior history of Echocardiogram examinations, most                 recent 04/22/2020. CHF, CAD; Risk Factors:Hypertension and                 Dyslipidemia.  Sonographer:    Irving Burton Senior RDCS Referring Phys: [id number] Hamblin V Finneus Kaneshiro  IMPRESSIONS  1. Left ventricular ejection fraction, by estimation, is 55 to 60%. The left ventricle has normal function. The left ventricle has no regional wall motion abnormalities. Left ventricular diastolic parameters are consistent with Grade II diastolic dysfunction (pseudonormalization).  2. Right ventricular systolic function is normal. The right ventricular size is normal. Tricuspid regurgitation signal is inadequate for assessing PA pressure.  3. Left atrial size was mild to moderately dilated.  4. The mitral valve is normal in structure. Mild mitral valve regurgitation.  5. The aortic valve is tricuspid. There is mild calcification of the aortic valve. Aortic valve regurgitation is not visualized.  6. The inferior vena cava is normal in size with <50% respiratory variability, suggesting right atrial pressure of 8 mmHg. FINDINGS  Left Ventricle: Left ventricular ejection fraction, by estimation, is  55 to 60%. The left ventricle has normal function. The left ventricle has no regional wall motion abnormalities. Strain imaging was not performed. The left ventricular internal cavity  size was normal in size. There is no left ventricular hypertrophy. Left ventricular diastolic parameters are consistent with Grade II diastolic dysfunction (pseudonormalization). Right Ventricle: The right ventricular size is normal. No increase in right ventricular wall thickness. Right ventricular systolic function is normal. Tricuspid regurgitation signal is inadequate for assessing PA pressure. Left Atrium: Left atrial size was mild to moderately dilated. Right Atrium: Right atrial size was normal in size. Pericardium: There is no evidence of pericardial effusion. Presence of epicardial fat layer. Mitral Valve: The mitral valve is normal in structure. Mild mitral annular calcification. Mild mitral valve regurgitation. Tricuspid Valve: The tricuspid valve is normal in structure. Tricuspid valve regurgitation is trivial. Aortic  Valve: The aortic valve is tricuspid. There is mild calcification of the aortic valve. Aortic valve regurgitation is not visualized. Pulmonic Valve: The pulmonic valve was normal in structure. Pulmonic valve regurgitation is trivial. Aorta: The aortic root and ascending aorta are structurally normal, with no evidence of dilitation. Venous: The inferior vena cava is normal in size with less than 50% respiratory variability, suggesting right atrial pressure of 8 mmHg. IAS/Shunts: No atrial level shunt detected by color flow Doppler. Additional Comments: 3D imaging was not performed.  LEFT VENTRICLE PLAX 2D LVIDd:         4.40 cm   Diastology LVIDs:         3.00 cm   LV e' medial:    7.46 cm/s LV PW:         0.70 cm   LV E/e' medial:  11.4 LV IVS:        0.80 cm   LV e' lateral:   8.08 cm/s LVOT diam:     1.90 cm   LV E/e' lateral: 10.5 LV SV:         86 LV SV Index:   47 LVOT Area:     2.84 cm  RIGHT VENTRICLE RV S prime:     10.70 cm/s TAPSE (M-mode): 1.8 cm LEFT ATRIUM             Index        RIGHT ATRIUM           Index LA diam:        3.70 cm 2.00 cm/m   RA Area:     13.80 cm LA Vol (A2C):   81.6 ml 44.20 ml/m  RA Volume:   31.90 ml  17.28 ml/m LA Vol (A4C):   61.5 ml 33.31 ml/m LA Biplane Vol: 70.8 ml 38.35 ml/m  AORTIC VALVE LVOT Vmax:   137.00 cm/s LVOT Vmean:  89.000 cm/s LVOT VTI:    0.305 m  AORTA Ao Root diam: 2.90 cm Ao Asc diam:  2.90 cm MITRAL VALVE MV Area (PHT): 2.55 cm    SHUNTS MV Decel Time: 298 msec    Systemic VTI:  0.30 m MV E velocity: 85.20 cm/s  Systemic Diam: 1.90 cm MV A velocity: 46.30 cm/s MV E/A ratio:  1.84 Arvilla Meres MD Electronically signed by Arvilla Meres MD Signature Date/Time: 11/13/2023/2:50:30 PM    Final    DG Chest 2 View Result Date: 11/12/2023 CLINICAL DATA:  Weakness. EXAM: CHEST - 2 VIEW COMPARISON:  11/12/2022. FINDINGS: Bilateral lung fields are clear. Bilateral costophrenic angles are clear. Stable cardio-mediastinal silhouette. No acute osseous  abnormalities. The soft  tissues are within normal limits. IMPRESSION: No active cardiopulmonary disease. Electronically Signed   By: Jules Schick M.D.   On: 11/12/2023 16:31   CT Head Wo Contrast Result Date: 11/12/2023 CLINICAL DATA:  Bilateral leg weakness for 1 week, increased falls and dizziness EXAM: CT HEAD WITHOUT CONTRAST TECHNIQUE: Contiguous axial images were obtained from the base of the skull through the vertex without intravenous contrast. RADIATION DOSE REDUCTION: This exam was performed according to the departmental dose-optimization program which includes automated exposure control, adjustment of the mA and/or kV according to patient size and/or use of iterative reconstruction technique. COMPARISON:  06/30/2023 FINDINGS: Brain: Stable chronic small-vessel ischemic changes throughout the periventricular white matter and basal ganglia. No evidence of acute infarct or hemorrhage. Lateral ventricles and midline structures are grossly unremarkable. There is a stable 7 mm posterior falcine meningioma, without mass effect. No acute extra-axial fluid collections. Vascular: Stable atherosclerosis.  No hyperdense vessel. Skull: Normal. Negative for fracture or focal lesion. Sinuses/Orbits: No acute finding. Other: None. IMPRESSION: 1. No acute intracranial process. 2. Stable chronic small-vessel ischemic changes throughout the white matter and basal ganglia. 3. Stable small left posterior para falcine meningioma measuring 7 mm. No mass effect. Electronically Signed   By: Sharlet Salina M.D.   On: 11/12/2023 16:05    Microbiology: Recent Results (from the past 240 hours)  Resp panel by RT-PCR (RSV, Flu A&B, Covid) Anterior Nasal Swab     Status: None   Collection Time: 11/12/23  2:08 PM   Specimen: Anterior Nasal Swab  Result Value Ref Range Status   SARS Coronavirus 2 by RT PCR NEGATIVE NEGATIVE Final   Influenza A by PCR NEGATIVE NEGATIVE Final   Influenza B by PCR NEGATIVE NEGATIVE Final     Comment: (NOTE) The Xpert Xpress SARS-CoV-2/FLU/RSV plus assay is intended as an aid in the diagnosis of influenza from Nasopharyngeal swab specimens and should not be used as a sole basis for treatment. Nasal washings and aspirates are unacceptable for Xpert Xpress SARS-CoV-2/FLU/RSV testing.  Fact Sheet for Patients: BloggerCourse.com  Fact Sheet for Healthcare Providers: SeriousBroker.it  This test is not yet approved or cleared by the [place] FDA and has been authorized for detection and/or diagnosis of SARS-CoV-2 by FDA under an Emergency Use Authorization (EUA). This EUA will remain in effect (meaning this test can be used) for the duration of the COVID-19 declaration under Section 564(b)(1) of the Act, [place] section 360bbb-3(b)(1), unless the authorization is terminated or revoked.     Resp Syncytial Virus by PCR NEGATIVE NEGATIVE Final    Comment: (NOTE) Fact Sheet for Patients: BloggerCourse.com  Fact Sheet for Healthcare Providers: SeriousBroker.it  This test is not yet approved or cleared by the [place] FDA and has been authorized for detection and/or diagnosis of SARS-CoV-2 by FDA under an Emergency Use Authorization (EUA). This EUA will remain in effect (meaning this test can be used) for the duration of the COVID-19 declaration under Section 564(b)(1) of the Act, [place] section 360bbb-3(b)(1), unless the authorization is terminated or revoked.  Performed at [facility], [address] [place], [place] [address]   Urine Culture     Status: Abnormal   Collection Time: 11/12/23  8:36 PM   Specimen: Urine, Clean Catch  Result Value Ref Range Status   Specimen Description URINE, CLEAN CATCH  Final   Special Requests   Final    Immunocompromised Performed at [facility], [address], [place],  [place] [address]    Culture  >=100,000 COLONIES/mL ESCHERICHIA COLI (A)  Final   Report Status 11/15/2023 FINAL  Final   Organism ID, Bacteria ESCHERICHIA COLI (A)  Final      Susceptibility   Escherichia coli - MIC*    AMPICILLIN <=2 SENSITIVE Sensitive     CEFAZOLIN <=4 SENSITIVE Sensitive     CEFEPIME <=0.12 SENSITIVE Sensitive     CEFTRIAXONE <=0.25 SENSITIVE Sensitive     CIPROFLOXACIN <=0.25 SENSITIVE Sensitive     GENTAMICIN <=1 SENSITIVE Sensitive     IMIPENEM <=0.25 SENSITIVE Sensitive     NITROFURANTOIN <=16 SENSITIVE Sensitive     TRIMETH/SULFA <=20 SENSITIVE Sensitive     AMPICILLIN/SULBACTAM <=2 SENSITIVE Sensitive     PIP/TAZO <=4 SENSITIVE Sensitive ug/mL    * >=100,000 COLONIES/mL ESCHERICHIA COLI     Labs: Basic Metabolic Panel: Recent Labs  Lab 11/12/23 1407 11/13/23 0651 11/14/23 0703 11/15/23 0738 11/16/23 1025  NA 128* 132* 136 135 138  K 3.1* 2.7* 4.3 4.1 4.7  CL 84* 92* 97* 95* 98  CO2 29 30 30 29 30   GLUCOSE 106* 106* 105* 139* 204*  BUN 21 21 20 18 18   CREATININE 1.01* 0.95 0.73 0.83 0.86  CALCIUM 8.5* 8.2* 9.1 9.1 9.5  MG  --  1.6* 1.9 1.7 1.7   Liver Function Tests: Recent Labs  Lab 11/12/23 1407 11/13/23 0651 11/14/23 0703 11/15/23 0738  AST 92* 60* 50* 47*  ALT 36 28 28 28   ALKPHOS 70 59 66 66  BILITOT 1.7* 0.9 0.6 0.6  PROT 5.5* 5.0* 4.9* 5.3*  ALBUMIN 2.5* 2.2* 2.1* 2.2*   No results for input(s): "LIPASE", "AMYLASE" in the last 168 hours. Recent Labs  Lab 11/15/23 1314  AMMONIA 27   CBC: Recent Labs  Lab 11/12/23 1407 11/13/23 0651 11/14/23 0703 11/15/23 0738 11/16/23 0840  WBC 5.0 3.5* 3.5* 4.5 4.7  NEUTROABS 3.6  --   --   --   --   HGB 15.1* 13.8 13.6 14.0 14.3  HCT 43.6 39.9 40.5 41.6 42.1  MCV 96.0 97.3 98.5 100.5* 99.5  PLT 56* 44* 54* 71* 85*   Cardiac Enzymes: No results for input(s): "CKTOTAL", "CKMB", "CKMBINDEX", "TROPONINI" in the last 168 hours. BNP: BNP (last 3 results) Recent Labs    06/30/23 1501 11/12/23 1424  BNP  349.7* 230.7*    ProBNP (last 3 results) Recent Labs    05/14/23 1102  PROBNP 509    CBG: Recent Labs  Lab 11/16/23 0837  GLUCAP 133*       Signed:  Ramiro Harvest MD.  [facility] 11/16/2023, 11:10 AM

## 2023-11-18 ENCOUNTER — Telehealth: Payer: Self-pay

## 2023-11-18 NOTE — Transitions of Care (Post Inpatient/ED Visit) (Signed)
 11/18/2023  Name: Jill Preston MRN: [id number] DOB: [birth year]  Today's TOC FU Call Status: Today's TOC FU Call Status:: Successful TOC FU Call Completed TOC FU Call Complete Date: 11/18/23 Patient's Name and Date of Birth confirmed.  Transition Care Management Follow-up Telephone Call Date of Discharge: 11/16/23 Discharge Facility: [facility]) Type of Discharge: Inpatient Admission Primary Inpatient Discharge Diagnosis:: Orthostatic hypotension How have you been since you were released from the hospital?: Better Any questions or concerns?: No (Patient denies)  Items Reviewed: Did you receive and understand the discharge instructions provided?: Yes Medications obtained,verified, and reconciled?: No Medications Not Reviewed Reasons:: Other: (patient states her son manages medications and is not currently with her - states son put medications in pill box) Any new allergies since your discharge?: No Dietary orders reviewed?: Yes Type of Diet Ordered:: Heart healthy Do you have support at home?: Yes People in Home: spouse, child(ren), adult Name of Support/Comfort Primary Source: Husband, son, paid caregiver, Pam help as needed  Medications Reviewed Today: Medications Reviewed Today     Reviewed by Jessy Oto, RN (Registered Nurse) on 11/18/23 at 1317  Med List Status: <None>   Medication Order Taking? Sig Documenting Provider Last Dose Status Informant  amLODipine (NORVASC) 2.5 MG tablet [id number]  Take 1 tablet (2.5 mg total) by mouth daily. Rodolph Bong, MD  Active   cefadroxil (DURICEF) 500 MG capsule [id number]  Take 1 capsule (500 mg total) by mouth 2 (two) times daily for 3 days. Rodolph Bong, MD  Active   cyanocobalamin (VITAMIN B12) 1000 MCG tablet [id number] No Take 1 tablet (1,000 mcg total) by mouth daily. [name], [name], NP Past Week Active Self, Pharmacy Records  ezetimibe (ZETIA) 10 MG tablet [id number] No Take 1 tablet (10 mg total) by  mouth every morning. Croitoru, Mihai, MD Past Week Active Self, Pharmacy Records  gabapentin (NEURONTIN) 300 MG capsule [id number] No Take 1 capsule (300 mg total) by mouth at bedtime. Venita Sheffield, MD Past Week Active Self, Pharmacy Records  isosorbide mononitrate (IMDUR) 30 MG 24 hr tablet [id number]  Take 0.5 tablets (15 mg total) by mouth daily. Rodolph Bong, MD  Active   latanoprost (XALATAN) 0.005 % ophthalmic solution [id number] No Place 1 drop into both eyes at bedtime. Medina-Vargas, Margit Banda, NP Past Week Active Self, Pharmacy Records  levothyroxine (SYNTHROID) 75 MCG tablet [id number] No Take 1 tablet (75 mcg total) by mouth daily before breakfast. Croitoru, Mihai, MD Past Week Active Self, Pharmacy Records           Med Note Nedra Hai, NICOLE   Tue Nov 12, 2023 10:38 PM) Patient takes at 0100  meclizine (ANTIVERT) 12.5 MG tablet [id number]  Take 1 tablet (12.5 mg total) by mouth 3 (three) times daily for 5 days, THEN 1 tablet (12.5 mg total) 3 (three) times daily as needed. Rodolph Bong, MD  Active   Nutritional Supplements (ENSURE HIGH PROTEIN) LIQD [id number] No Take 1 Can by mouth daily.  Patient taking differently: Take 2 Cans by mouth 2 (two) times daily.   [name], [name], NP Past Week Active Self, Pharmacy Records  nystatin cream (MYCOSTATIN) [id number] No APPLY 1 APPLICATION TOPICALLY TWICE DAILY *REFILL REQUEST*  Patient taking differently: 1 Application 2 (two) times daily as needed (itch).   [name], [name], NP Past Week Active Self, Pharmacy Records  OVER THE COUNTER MEDICATION [id number] No Take 1 capsule by mouth daily. OmegaXL joint and muscle support [provider] Past  Week Active Self, Pharmacy Records  potassium chloride SA (KLOR-CON M) 20 MEQ tablet [id number]  Take 1 tablet (20 mEq total) by mouth 2 (two) times daily. Rodolph Bong, MD  Active   pravastatin (PRAVACHOL) 80 MG tablet [id number] No Take 1 tablet (80 mg total) by mouth at bedtime.  Medina-Vargas, Monina C, NP Past Week Active Self, Pharmacy Records  sertraline (ZOLOFT) 25 MG tablet [id number] No Take 1 tablet (25 mg total) by mouth daily. [name], [name], NP Past Week Active Self, Pharmacy Records  torsemide (DEMADEX) 20 MG tablet [id number]  Take 1 tablet (20 mg total) by mouth daily. Rodolph Bong, MD  Active             Home Care and Equipment/Supplies: Were Home Health Services Ordered?: Yes Name of Home Health Agency:: [facility] Has Agency set up a time to come to your home?: No EMR reviewed for Home Health Orders: Orders present/patient has not received call (refer to CM for follow-up) (CM placed conference call to  [facility] [phone] - They are going to call son) Any new equipment or medical supplies ordered?: No  Functional Questionnaire: Do you need assistance with bathing/showering or dressing?: Yes (Paid Caregiver is helping) Do you need assistance with meal preparation?: Yes (Patient gets MOW) Do you need assistance with eating?: No Do you need assistance with getting out of bed/getting out of a chair/moving?: Yes (Patient has someone with her getting out of bed to wheelchair) Do you have difficulty managing or taking your medications?: Yes (Patient states son and daughter in law are managing medications)  Follow up appointments reviewed: PCP Follow-up appointment confirmed?: Yes Date of PCP follow-up appointment?: 11/19/23 Follow-up Provider: [name], [facility] Follow-up appointment confirmed?: Yes Date of Specialist follow-up appointment?: 01/07/24 (Patient states she has called to have it rescheduled) Follow-Up Specialty Provider:: Cardiology, Mihai Croitoru Do you need transportation to your follow-up appointment?:  ('Patient states her son will take her to MD appoitnment. patient states paid caregiver takes her to appointments as well) Do you understand care options if your  condition(s) worsen?: Yes-patient verbalized understanding  SDOH Interventions Today    Flowsheet Row Most Recent Value  SDOH Interventions   Food Insecurity Interventions Intervention Not Indicated  Housing Interventions Intervention Not Indicated  Transportation Interventions Intervention Not Indicated  Utilities Interventions Intervention Not Indicated       Goals Addressed             This Visit's Progress    Patient Stated       Current Barriers:  Unable to complete medication reconciliation related to son is managing medications and was not present (patient states he has updated her pill box)  RNCM Clinical Goal(s):  Patient will work with the Care Management team over the next 30 days to address Transition of Care Barriers: Medication Management Patient will take all medications exactly as prescribed and patient or son will call provider for medication related questions as evidenced by patient/son report and health record Patient will attend all scheduled medical appointments: PCP appointment 11/19/23 and will follow up with cardiology to move up current appointment as evidenced by patient/son report and record Patient /Caregiver/Son will notify this [facility] The RN if they do not hear from Home health agency Patient will use walker or wheelchair until she feels stronger and steady and given okay by Home Health due to recent falls Patient will report any signs/symptoms UTI Patient will change positions slowly and  will report any ongoing or worsening vertigo   Interventions:  Transitions of Care:  New goal. Doctor Visits  - discussed the importance of doctor visits PCP appointment 11/19/23 and need to scheduled cardiology appointment within 1 week of discharge (patient states she callled and left message) Contacted Health RN/OT/PT - [facility] [phone] and requested they call to advise patient's son when they will be starting care Reviewed Signs and symptoms of  UTI and when to call MD Assessed patient's home status - Patient has paid caregiver, Pam 5 days a week, husband (who has dementia) is in the home, Son, Lelon Huh is staying nights and his wife is helping as needed. Discussed TOC program with friend/neighbor, Gilda who got on the phone - Rod Holler states she will give son this RN's name/number    Falls Interventions:  (Status:  New goal.) Short Term Goal  Advised patient of importance of notifying provider of falls *Assessed for signs and symptoms of orthostatic hypotension *Educated to report any falls to PCP   Patient Goals/Self-Care Activities: Participate in Transition of Care Program/Attend [facility] scheduled calls Notify RN Care Manager of TOC call rescheduling needs Take all medications as prescribed - son is managing and was not present to review during call Attend all scheduled provider appointments - PCP appointment is scheduled for 11/19/23 - Patient has already called to reschedule cardiology appointment (should see in 1 week) Report any falls with or without injury to MD  Report any worsening vertigo or new signs of confusion or symptoms related to UTI  Follow Up Plan:  Telephone follow up appointment with care management team member scheduled for:  11/26/23 1pm  The patient has been provided with contact information for the care management team and has been advised to call with any health related questions or concerns.           Hilbert Odor RN, CCM [place]  VBCI-Population Health RN Care Manager [phone]

## 2023-11-19 ENCOUNTER — Encounter: Payer: Self-pay | Admitting: Family

## 2023-11-19 ENCOUNTER — Ambulatory Visit (INDEPENDENT_AMBULATORY_CARE_PROVIDER_SITE_OTHER): Payer: PPO | Admitting: Family

## 2023-11-19 VITALS — BP 110/62 | HR 70 | Temp 97.7°F | Resp 17 | Ht 65.0 in | Wt 189.8 lb

## 2023-11-19 DIAGNOSIS — I10 Essential (primary) hypertension: Secondary | ICD-10-CM

## 2023-11-19 DIAGNOSIS — E039 Hypothyroidism, unspecified: Secondary | ICD-10-CM | POA: Diagnosis not present

## 2023-11-19 DIAGNOSIS — E876 Hypokalemia: Secondary | ICD-10-CM

## 2023-11-19 DIAGNOSIS — E871 Hypo-osmolality and hyponatremia: Secondary | ICD-10-CM

## 2023-11-19 DIAGNOSIS — I5032 Chronic diastolic (congestive) heart failure: Secondary | ICD-10-CM

## 2023-11-19 NOTE — Progress Notes (Signed)
 Provider: Richarda Blade FNP-C  Yani Coventry, [name], NP  Patient Care Team: Karel Turpen, [name], NP as PCP - General (Family Medicine) Croitoru, Rachelle Hora, MD as PCP - Cardiology (Cardiology) Thurmon Fair, MD as Consulting Physician (Cardiology) Cherlyn Roberts, MD as Referring Physician (Dermatology) Park Liter, DPM (Inactive) as Consulting Physician (Podiatry) Burund[place], [name], OD (Optometry) Jerrell Mylar, MD as Referring Physician (Gynecology)  Extended Emergency Contact Information Primary Emergency Contact: Wimberly,[name] Home Phone: [phone] Mobile Phone: [phone] Relation: Son Secondary Emergency Contact: [facility] Phone: [phone] Mobile Phone: [phone] Relation: Friend  Code Status:  Full Code  Goals of care: Advanced Directive information    11/19/2023   10:32 AM  Advanced Directives  Does Patient Have a Medical Advance Directive? Yes  Type of Estate agent of [place];Living will  Does patient want to make changes to medical advance directive? No - Patient declined  Copy of Healthcare Power of Attorney in Chart? Yes - validated most recent copy scanned in chart (See row information)     Chief Complaint  Patient presents with   Acute Visit    Larey Seat over weekend.    Discussed the use of AI scribe software for clinical note transcription with the patient, who gave verbal consent to proceed.  History of Present Illness   Jill Preston is an 89 year old female who presents with weakness and fatigue following a recent fall and electrolyte imbalances. She is accompanied by her caregiver, Massie Bougie.  She experienced weakness and fatigue following a fall over the past weekend, which led to her hospitalization from Monday to Wednesday. During her hospital stay, she was confused and unaware of her surroundings for the first two days. Her laboratory results  showed hyponatremia with sodium levels at 128 mEq/L and hypokalemia with potassium levels at 3.1 mEq/L. She also had orthostatic hypotension with blood pressure readings in the nineties over sixty. She was treated with IV antibiotics for a urinary tract infection and discharged with oral antibiotics, which she is unsure if she completed.  She experiences dizziness, vertigo, and confusion, particularly when lying down. She has been prescribed medication for these symptoms, but she is unsure if she has taken it. A CT scan during her hospital stay was negative, and her vitamin B12 and ammonia levels were normal.  She has experienced multiple falls, including one where she fell face forward in the living room, resulting in bruises and tenderness, particularly behind her ear and beside her eye. She uses a walker.  She reports a significant weight gain of 20 pounds during her hospital stay, from 170 pounds to 190 pounds. Her appetite has been poor, and she is eating smaller meals. She mentions having an egg sandwich with bacon and a biscuit for breakfast.  She has a history of liver cirrhosis and splenomegaly, which contribute to her abdominal distension. No recent bleeding, but she notes that her bowel movements occur unexpectedly when she attempts to urinate, attributing this to the antibiotics she received.    Past Medical History:  Diagnosis Date   Arthritis    right knee   Cancer (HCC) yrs ago   skin cancer removed from face   Chronic diastolic CHF (congestive heart failure) (HCC)    Chronic venous insufficiency    LEA VENOUS, 10/17/2011 - mild reflux in bilateral common femoral veins   CKD (chronic kidney disease), stage III (HCC)    Coronary  artery disease    a. s/p PCI/BMS to prox LAD and balloon angioplasty to mLAD with suboptimal result in 2000. b. Abnl nuc 2012, cath 09/2011 - showed that the overall territory of potential ischemia was small and attributable to a moderately diseased small  second diagonal artery. Med rx.   Dyspnea    with activity   Headache    History of blood transfusion 2017   Hyperlipidemia    Hypertension    Hypertensive heart disease    Hypothyroidism    Obesity    Pneumonia    several times   Stroke [facility])    pt. states she had "light stroke" in sept. 1980   Urinary incontinence    Past Surgical History:  Procedure Laterality Date   ABDOMINAL HYSTERECTOMY  1970's   complete   ANKLE ARTHROSCOPY Right 07/10/2017   Procedure: ANKLE ARTHROSCOPY;  Surgeon: Park Liter, DPM;  Location: [facility] OR;  Service: Podiatry;  Laterality: Right;   BACK SURGERY  07/26/2016   cervical neck surgery, [facility]   BALLOON DILATION N/A 12/01/2021   Procedure: BALLOON DILATION;  Surgeon: Jeani Hawking, MD;  Location: [facility] ENDOSCOPY;  Service: Gastroenterology;  Laterality: N/A;   BLADDER SURGERY  2010   WITH MESH  bladder tach   bunion removal surgery Bilateral 15 yrs ago   CARDIAC CATHETERIZATION Left 09/25/2011   Medical management   CARDIAC CATHETERIZATION Left 03/25/2001   Normal LV function, LAD residual narrowing of less than 10%, normal ramus intermediate, circumflex, and RCA,    CARDIAC CATHETERIZATION  09/04/1999   LAD, 3x57mm Tetra stent resulting in a reduction of the 80% stenosis to 0% residual   CARDIAC CATHETERIZATION N/A 01/26/2015   Procedure: Right/Left Heart Cath and Coronary Angiography;  Surgeon: Lennette Bihari, MD;  Location: [facility] INVASIVE CV LAB;  Service: Cardiovascular;  Laterality: N/A;   CARDIAC CATHETERIZATION N/A 01/27/2015   Procedure: Intravascular Pressure Wire/FFR Study;  Surgeon: Kathleene Hazel, MD;  Location: [facility] INVASIVE CV LAB;  Service: Cardiovascular;  Laterality: N/A;   CARDIAC CATHETERIZATION N/A 01/27/2015   Procedure: Right Heart Cath;  Surgeon: Kathleene Hazel, MD;  Location: [facility] INVASIVE CV LAB;  Service: Cardiovascular;  Laterality: N/A;   CHOLECYSTECTOMY     COLONOSCOPY WITH PROPOFOL N/A 03/07/2017    Procedure: COLONOSCOPY WITH PROPOFOL;  Surgeon: Charna Elizabeth, MD;  Location: [facility] ENDOS[facility];  Service: Endoscopy;  Laterality: N/A;   CORONARY PRESSURE/FFR STUDY N/A 04/05/2017   Procedure: Intravascular Pressure Wire/FFR Study;  Surgeon: Swaziland, Peter M, MD;  Location: [facility] INVASIVE CV LAB;  Service: Cardiovascular;  Laterality: N/A;   CORONARY STENT PLACEMENT     LAD x 1   ESOPHAGOGASTRODUODENOSCOPY (EGD) WITH PROPOFOL N/A 12/01/2021   Procedure: ESOPHAGOGASTRODUODENOSCOPY (EGD) WITH PROPOFOL;  Surgeon: Jeani Hawking, MD;  Location: [facility];  Service: Gastroenterology;  Laterality: N/A;   ganglion cyst removal  yrs ago   x 2   ILIAC VEIN ANGIOPLASTY / STENTING  02/15/2015   IVC FILTER INSERTION  2016   IVC FILTER REMOVAL  02/15/2015   at [facility]   LEFT HEART CATH AND CORONARY ANGIOGRAPHY N/A 04/05/2017   Procedure: Left Heart Cath and Coronary Angiography;  Surgeon: Swaziland, Peter M, MD;  Location: [facility] INVASIVE CV LAB;  Service: Cardiovascular;  Laterality: N/A;   LEFT HEART CATHETERIZATION WITH CORONARY ANGIOGRAM N/A 09/25/2011   Procedure: LEFT HEART CATHETERIZATION WITH CORONARY ANGIOGRAM;  Surgeon: Thurmon Fair, MD;  Location: [facility] CATH LAB;  Service: Cardiovascular;  Laterality: N/A;  multiple bladder surgeries to remove mesh     ROTATOR CUFF REPAIR Right    stent to groin Left 08/2014   left leg   TENDON REPAIR Right 07/10/2017   Procedure: RIGHT PERONEAL TENDON REPAIR;  Surgeon: Park Liter, DPM;  Location: [facility] OR;  Service: Podiatry;  Laterality: Right;   TENDON REPAIR Right 05/27/2019   Procedure: PERONEAL TENDON REPAIR x2;  Surgeon: Park Liter, DPM;  Location: [facility] ORS;  Service: Podiatry;  Laterality: Right;    Allergies  Allergen Reactions   Atorvastatin Anaphylaxis and Other (See Comments)    Patient does not remember; caused pneumonia- took her off of it per patient   Penicillins Anaphylaxis, Hives, Swelling and Other (See Comments)    Tongue swelling Did it involve  swelling of the face/tongue/throat, SOB, or low BP? Yes Did it involve sudden or severe rash/hives, skin peeling, or any reaction on the inside of your mouth or nose? Yes Did you need to seek medical attention at a hospital or doctor's office? Yes When did it last happen?  18 or 89 years old    If all above answers are "NO", may proceed with cephalosporin use.    Shellfish Allergy Anaphylaxis, Nausea And Vomiting and Other (See Comments)    Severe nausea and vomiting   Aminophylline Itching, Swelling, Rash and Other (See Comments)    Pt experienced burning urination, itching, redness/rash, and swelling of genitals after given this medication via IV push.   Iodinated Contrast Media Swelling and Other (See Comments)    FLUSHING, also; LLE became swollen   Latex Other (See Comments)    Causes blisters   Oxybutynin Chloride Other (See Comments)    "blisters"   Propoxyphene Other (See Comments)    Unknown reaction   Vancomycin Hives and Other (See Comments)    06/02/2018- Hives arm, back and chest   Adhesive [Tape] Rash and Other (See Comments)    Paper tape is ok   Betadine [Povidone Iodine] Rash   Ciprofloxacin Hives   Codeine Nausea And Vomiting    .   Ranolazine Other (See Comments)    Constipation  .    Outpatient Encounter Medications as of 11/19/2023  Medication Sig   amLODipine (NORVASC) 2.5 MG tablet Take 1 tablet (2.5 mg total) by mouth daily.   cefadroxil (DURICEF) 500 MG capsule Take 1 capsule (500 mg total) by mouth 2 (two) times daily for 3 days.   cyanocobalamin (VITAMIN B12) 1000 MCG tablet Take 1 tablet (1,000 mcg total) by mouth daily.   ezetimibe (ZETIA) 10 MG tablet Take 1 tablet (10 mg total) by mouth every morning.   gabapentin (NEURONTIN) 300 MG capsule Take 1 capsule (300 mg total) by mouth at bedtime.   isosorbide mononitrate (IMDUR) 30 MG 24 hr tablet Take 0.5 tablets (15 mg total) by mouth daily.   latanoprost (XALATAN) 0.005 % ophthalmic solution Place 1  drop into both eyes at bedtime.   levothyroxine (SYNTHROID) 75 MCG tablet Take 1 tablet (75 mcg total) by mouth daily before breakfast.   meclizine (ANTIVERT) 12.5 MG tablet Take 1 tablet (12.5 mg total) by mouth 3 (three) times daily for 5 days, THEN 1 tablet (12.5 mg total) 3 (three) times daily as needed.   Nutritional Supplements (ENSURE HIGH PROTEIN) LIQD Take 1 Can by mouth daily. (Patient taking differently: Take 2 Cans by mouth 2 (two) times daily.)   nystatin cream (MYCOSTATIN) APPLY 1 APPLICATION TOPICALLY TWICE DAILY *REFILL REQUEST* (Patient taking differently:  1 Application 2 (two) times daily as needed (itch).)   OVER THE COUNTER MEDICATION Take 1 capsule by mouth daily. OmegaXL joint and muscle support   potassium chloride SA (KLOR-CON M) 20 MEQ tablet Take 1 tablet (20 mEq total) by mouth 2 (two) times daily.   pravastatin (PRAVACHOL) 80 MG tablet Take 1 tablet (80 mg total) by mouth at bedtime.   sertraline (ZOLOFT) 25 MG tablet Take 1 tablet (25 mg total) by mouth daily.   torsemide (DEMADEX) 20 MG tablet Take 1 tablet (20 mg total) by mouth daily.   No facility-administered encounter medications on file as of 11/19/2023.    Review of Systems  Constitutional:  Negative for appetite change, chills, fatigue, fever and unexpected weight change.  HENT:  Negative for congestion, ear discharge, ear pain, hearing loss, nosebleeds, postnasal drip, rhinorrhea, sinus pressure, sinus pain, sneezing, sore throat, tinnitus and trouble swallowing.   Eyes:  Negative for pain, discharge, redness, itching and visual disturbance.  Respiratory:  Negative for cough, chest tightness, shortness of breath and wheezing.   Cardiovascular:  Negative for chest pain, palpitations and leg swelling.  Gastrointestinal:  Negative for abdominal distention, abdominal pain, blood in stool, constipation, diarrhea, nausea and vomiting.  Endocrine: Negative for cold intolerance, heat intolerance, polydipsia,  polyphagia and polyuria.  Genitourinary:  Negative for difficulty urinating, dysuria, flank pain, frequency and urgency.  Musculoskeletal:  Positive for arthralgias and gait problem. Negative for back pain, joint swelling, myalgias, neck pain and neck stiffness.  Skin:  Negative for color change, pallor, rash and wound.       Bruise on the back and right hand   Neurological:  Negative for syncope, speech difficulty, weakness, light-headedness, numbness and headaches.       Chronic vertigo   Hematological:  Does not bruise/bleed easily.  Psychiatric/Behavioral:  Negative for agitation, behavioral problems, confusion, hallucinations, self-injury, sleep disturbance and suicidal ideas. The patient is not nervous/anxious.     Immunization History  Administered Date(s) Administered   PFIZER(Purple Top)SARS-COV-2 Vaccination 11/11/2019, 11/13/2019, 12/08/2019   Pneumococcal Conjugate-13 06/14/2014   Pneumococcal Polysaccharide-23 10/26/2013, 03/30/2020   Tdap 12/19/2015, 11/12/2022   Pertinent  Health Maintenance Due  Topic Date Due   INFLUENZA VACCINE  12/16/2023 (Originally 04/18/2023)   DEXA SCAN  Completed      08/08/2023    9:21 AM 08/23/2023   11:11 AM 09/20/2023   10:31 AM 11/08/2023   12:49 PM 11/19/2023   10:31 AM  Fall Risk  Falls in the past year? 0 0 0 1 1  Was there an injury with Fall? 0 1 0 1 1  Fall Risk Category Calculator 0 1 0 3 2  Patient at Risk for Falls Due to History of fall(s) History of fall(s)  History of fall(s);Impaired balance/gait;Impaired mobility No Fall Risks  Fall risk Follow up Falls evaluation completed Falls evaluation completed  Falls evaluation completed Falls evaluation completed;Education provided;Falls prevention discussed   Functional Status Survey:    Vitals:   11/19/23 1033  BP: 110/62  Pulse: 70  Resp: 17  Temp: 97.7 F (36.5 C)  SpO2: 95%  Weight: 189 lb 12.8 oz (86.1 kg)  Height: 5\' 5"  (1.651 m)   Body mass index is 31.58  kg/m. Physical Exam  VITALS: T- 97.7, P- 70, BP- 110/62, SaO2- 95% MEASUREMENTS: Weight- 189.8. GENERAL: Alert, cooperative, well developed, no acute distress. HEENT: Normocephalic, normal oropharynx, moist mucous membranes, tenderness behind ear and beside eye, no visible bruising. CHEST: Clear to auscultation bilaterally,  no wheezes, rhonchi, or crackles. CARDIOVASCULAR: Normal heart rate and rhythm, S1 and S2 normal without murmurs. ABDOMEN: Tenderness in lower quadrants, soft, distended, with organomegaly, normal bowel sounds. EXTREMITIES: Bruising on legs, no cyanosis or edema. NEUROLOGICAL: Cranial nerves grossly intact, moves all extremities without gross motor or sensory deficit. SKIN: Purple bruise on lower back and dark bruise on dorsal right hand  PSYCHIATRY/BEHAVIORAL: Mood stable Alert and oriented x 3     Labs reviewed: Recent Labs    11/14/23 0703 11/15/23 0738 11/16/23 1025  NA 136 135 138  K 4.3 4.1 4.7  CL 97* 95* 98  CO2 30 29 30   GLUCOSE 105* 139* 204*  BUN 20 18 18   CREATININE 0.73 0.83 0.86  CALCIUM 9.1 9.1 9.5  MG 1.9 1.7 1.7   Recent Labs    11/13/23 0651 11/14/23 0703 11/15/23 0738  AST 60* 50* 47*  ALT 28 28 28   ALKPHOS 59 66 66  BILITOT 0.9 0.6 0.6  PROT 5.0* 4.9* 5.3*  ALBUMIN 2.2* 2.1* 2.2*   Recent Labs    06/30/23 1501 09/20/23 1118 11/12/23 1407 11/13/23 0651 11/14/23 0703 11/15/23 0738 11/16/23 0840  WBC 4.4 5.0 5.0   < > 3.5* 4.5 4.7  NEUTROABS 3.0 3,365 3.6  --   --   --   --   HGB 14.6 14.4 15.1*   < > 13.6 14.0 14.3  HCT 45.1 43.6 43.6   < > 40.5 41.6 42.1  MCV 103.4* 102.1* 96.0   < > 98.5 100.5* 99.5  PLT 78* 100* 56*   < > 54* 71* 85*   < > = values in this interval not displayed.   Lab Results  Component Value Date   TSH 1.44 09/20/2023   Lab Results  Component Value Date   HGBA1C 6.1 (H) 01/26/2015   Lab Results  Component Value Date   CHOL 122 09/20/2023   HDL 63 09/20/2023   LDLCALC 45 09/20/2023    TRIG 59 09/20/2023   CHOLHDL 1.9 09/20/2023    Significant Diagnostic Results in last 30 days:  ECHOCARDIOGRAM COMPLETE Result Date: 11/13/2023    ECHOCARDIOGRAM REPORT   Patient Name:   DEKAYLA PRESTRIDGE Date of Exam: 11/13/2023 Medical Rec #:  [id number]         Height:       65.0 in Accession #:    [id number]        Weight:       170.0 lb Date of Birth:  [birth year]        BSA:          1.846 m Patient Age:    89 years          BP:           111/57 mmHg Patient Gender: F                 HR:           59 bpm. Exam Location:  Inpatient Procedure: 2D Echo, Color Doppler and Cardiac Doppler (Both Spectral and Color            Flow Doppler were utilized during procedure). Indications:    Elevated Troponins  History:        Patient has prior history of Echocardiogram examinations, most                 recent 04/22/2020. CHF, CAD; Risk Factors:Hypertension and  Dyslipidemia.  Sonographer:    Irving Burton Senior RDCS Referring Phys: [id number] DANIEL V THOMPSON IMPRESSIONS  1. Left ventricular ejection fraction, by estimation, is 55 to 60%. The left ventricle has normal function. The left ventricle has no regional wall motion abnormalities. Left ventricular diastolic parameters are consistent with Grade II diastolic dysfunction (pseudonormalization).  2. Right ventricular systolic function is normal. The right ventricular size is normal. Tricuspid regurgitation signal is inadequate for assessing PA pressure.  3. Left atrial size was mild to moderately dilated.  4. The mitral valve is normal in structure. Mild mitral valve regurgitation.  5. The aortic valve is tricuspid. There is mild calcification of the aortic valve. Aortic valve regurgitation is not visualized.  6. The inferior vena cava is normal in size with <50% respiratory variability, suggesting right atrial pressure of 8 mmHg. FINDINGS  Left Ventricle: Left ventricular ejection fraction, by estimation, is 55 to 60%. The left ventricle has normal function.  The left ventricle has no regional wall motion abnormalities. Strain imaging was not performed. The left ventricular internal cavity  size was normal in size. There is no left ventricular hypertrophy. Left ventricular diastolic parameters are consistent with Grade II diastolic dysfunction (pseudonormalization). Right Ventricle: The right ventricular size is normal. No increase in right ventricular wall thickness. Right ventricular systolic function is normal. Tricuspid regurgitation signal is inadequate for assessing PA pressure. Left Atrium: Left atrial size was mild to moderately dilated. Right Atrium: Right atrial size was normal in size. Pericardium: There is no evidence of pericardial effusion. Presence of epicardial fat layer. Mitral Valve: The mitral valve is normal in structure. Mild mitral annular calcification. Mild mitral valve regurgitation. Tricuspid Valve: The tricuspid valve is normal in structure. Tricuspid valve regurgitation is trivial. Aortic Valve: The aortic valve is tricuspid. There is mild calcification of the aortic valve. Aortic valve regurgitation is not visualized. Pulmonic Valve: The pulmonic valve was normal in structure. Pulmonic valve regurgitation is trivial. Aorta: The aortic root and ascending aorta are structurally normal, with no evidence of dilitation. Venous: The inferior vena cava is normal in size with less than 50% respiratory variability, suggesting right atrial pressure of 8 mmHg. IAS/Shunts: No atrial level shunt detected by color flow Doppler. Additional Comments: 3D imaging was not performed.  LEFT VENTRICLE PLAX 2D LVIDd:         4.40 cm   Diastology LVIDs:         3.00 cm   LV e' medial:    7.46 cm/s LV PW:         0.70 cm   LV E/e' medial:  11.4 LV IVS:        0.80 cm   LV e' lateral:   8.08 cm/s LVOT diam:     1.90 cm   LV E/e' lateral: 10.5 LV SV:         86 LV SV Index:   47 LVOT Area:     2.84 cm  RIGHT VENTRICLE RV S prime:     10.70 cm/s TAPSE (M-mode): 1.8 cm  LEFT ATRIUM             Index        RIGHT ATRIUM           Index LA diam:        3.70 cm 2.00 cm/m   RA Area:     13.80 cm LA Vol (A2C):   81.6 ml 44.20 ml/m  RA Volume:   31.90 ml  17.28  ml/m LA Vol (A4C):   61.5 ml 33.31 ml/m LA Biplane Vol: 70.8 ml 38.35 ml/m  AORTIC VALVE LVOT Vmax:   137.00 cm/s LVOT Vmean:  89.000 cm/s LVOT VTI:    0.305 m  AORTA Ao Root diam: 2.90 cm Ao Asc diam:  2.90 cm MITRAL VALVE MV Area (PHT): 2.55 cm    SHUNTS MV Decel Time: 298 msec    Systemic VTI:  0.30 m MV E velocity: 85.20 cm/s  Systemic Diam: 1.90 cm MV A velocity: 46.30 cm/s MV E/A ratio:  1.84 Arvilla Meres MD Electronically signed by Arvilla Meres MD Signature Date/Time: 11/13/2023/2:50:30 PM    Final    DG Chest 2 View Result Date: 11/12/2023 CLINICAL DATA:  Weakness. EXAM: CHEST - 2 VIEW COMPARISON:  11/12/2022. FINDINGS: Bilateral lung fields are clear. Bilateral costophrenic angles are clear. Stable cardio-mediastinal silhouette. No acute osseous abnormalities. The soft tissues are within normal limits. IMPRESSION: No active cardiopulmonary disease. Electronically Signed   By: Jules Schick M.D.   On: 11/12/2023 16:31   CT Head Wo Contrast Result Date: 11/12/2023 CLINICAL DATA:  Bilateral leg weakness for 1 week, increased falls and dizziness EXAM: CT HEAD WITHOUT CONTRAST TECHNIQUE: Contiguous axial images were obtained from the base of the skull through the vertex without intravenous contrast. RADIATION DOSE REDUCTION: This exam was performed according to the departmental dose-optimization program which includes automated exposure control, adjustment of the mA and/or kV according to patient size and/or use of iterative reconstruction technique. COMPARISON:  06/30/2023 FINDINGS: Brain: Stable chronic small-vessel ischemic changes throughout the periventricular white matter and basal ganglia. No evidence of acute infarct or hemorrhage. Lateral ventricles and midline structures are grossly unremarkable.  There is a stable 7 mm posterior falcine meningioma, without mass effect. No acute extra-axial fluid collections. Vascular: Stable atherosclerosis.  No hyperdense vessel. Skull: Normal. Negative for fracture or focal lesion. Sinuses/Orbits: No acute finding. Other: None. IMPRESSION: 1. No acute intracranial process. 2. Stable chronic small-vessel ischemic changes throughout the white matter and basal ganglia. 3. Stable small left posterior para falcine meningioma measuring 7 mm. No mass effect. Electronically Signed   By: Sharlet Salina M.D.   On: 11/12/2023 16:05    Assessment/Plan   Hyponatremia and Hypokalemia Hospitalized due to hyponatremia (128 mEq/L) and hypokalemia (3.1 mEq/L), contributing to weakness, orthostatic hypotension, fall, and confusion. Sodium and potassium levels are below normal ranges (135-145 mEq/L and 3.5-5.0 mEq/L, respectively). - Recheck sodium, potassium, and magnesium levels  Orthostatic Hypotension/Hypertension  Orthostatic hypotension contributed to her fall. Blood pressure was low at the hospital (90s/60s) and is currently 110/62. Potentially related to medication regimen and electrolyte imbalances. - Continue home blood pressure medications as prescribed  Confusion Experienced confusion during hospital stay. CT scan was negative. Vitamin B12 deficiency, ammonia levels, and latent syphilis were ruled out. Confusion may be related to electrolyte imbalances and UTI. - Monitor mental status and re-evaluate if confusion persists - confusion has resolved   Urinary Tract Infection (UTI) Diagnosed with UTI during hospital stay, treated with IV and oral antibiotics. Unclear if oral antibiotic course was completed. UTIs can contribute to confusion and weakness, especially in the elderly. - Ensure completion of oral antibiotics for UTI - Consider checking for any remaining symptoms of UTI  Liver Cirrhosis with Splenomegaly Liver cirrhosis with splenomegaly contributes  to low platelet count and abdominal distension. Requires ongoing monitoring. - Monitor liver function and platelet count with blood work  Vertigo Experiences vertigo, particularly when lying down. Prescribed meclizine  for symptomatic relief. Vertigo is bothersome, especially at night. - Continue meclizine as needed for vertigo   Hypothyroidism  Continue on Levothyroxine   Family/ staff Communication: Reviewed plan of care with patient and care giver verbalized understanding   Labs/tests ordered:  - CBC with Differential/Platelet - CMP with eGFR([facility]) - Magnesium level    Next Appointment: Return if symptoms worsen or fail to improve.   Total time: minutes. Greater than 50% of total time spent doing patient education regarding hypokalemia,hyponatremia,vertigo,HTN,liver Cirrhosis,confusion ,UTI and health maintenance including symptom/medication management.   [name], NP

## 2023-11-20 ENCOUNTER — Ambulatory Visit: Attending: Cardiology | Admitting: Cardiology

## 2023-11-20 ENCOUNTER — Encounter: Payer: Self-pay | Admitting: Cardiology

## 2023-11-20 VITALS — BP 108/44 | HR 85 | Ht 65.5 in | Wt 192.0 lb

## 2023-11-20 DIAGNOSIS — E7800 Pure hypercholesterolemia, unspecified: Secondary | ICD-10-CM | POA: Diagnosis not present

## 2023-11-20 DIAGNOSIS — I5032 Chronic diastolic (congestive) heart failure: Secondary | ICD-10-CM

## 2023-11-20 DIAGNOSIS — I2510 Atherosclerotic heart disease of native coronary artery without angina pectoris: Secondary | ICD-10-CM | POA: Diagnosis not present

## 2023-11-20 DIAGNOSIS — I10 Essential (primary) hypertension: Secondary | ICD-10-CM | POA: Diagnosis not present

## 2023-11-20 DIAGNOSIS — I951 Orthostatic hypotension: Secondary | ICD-10-CM

## 2023-11-20 DIAGNOSIS — R296 Repeated falls: Secondary | ICD-10-CM | POA: Diagnosis not present

## 2023-11-20 LAB — CBC WITH DIFFERENTIAL/PLATELET
Absolute Lymphocytes: 968 {cells}/uL (ref 850–3900)
Absolute Monocytes: 437 {cells}/uL (ref 200–950)
Basophils Absolute: 47 {cells}/uL (ref 0–200)
Basophils Relative: 0.8 %
Eosinophils Absolute: 159 {cells}/uL (ref 15–500)
Eosinophils Relative: 2.7 %
HCT: 42.2 % (ref 35.0–45.0)
Hemoglobin: 14.1 g/dL (ref 11.7–15.5)
MCH: 33.7 pg — ABNORMAL HIGH (ref 27.0–33.0)
MCHC: 33.4 g/dL (ref 32.0–36.0)
MCV: 100.7 fL — ABNORMAL HIGH (ref 80.0–100.0)
MPV: 10.3 fL (ref 7.5–12.5)
Monocytes Relative: 7.4 %
Neutro Abs: 4289 {cells}/uL (ref 1500–7800)
Neutrophils Relative %: 72.7 %
Platelets: 175 10*3/uL (ref 140–400)
RBC: 4.19 10*6/uL (ref 3.80–5.10)
RDW: 12.6 % (ref 11.0–15.0)
Total Lymphocyte: 16.4 %
WBC: 5.9 10*3/uL (ref 3.8–10.8)

## 2023-11-20 LAB — COMPLETE METABOLIC PANEL WITH GFR
AG Ratio: 1.3 (calc) (ref 1.0–2.5)
ALT: 26 U/L (ref 6–29)
AST: 42 U/L — ABNORMAL HIGH (ref 10–35)
Albumin: 3 g/dL — ABNORMAL LOW (ref 3.6–5.1)
Alkaline phosphatase (APISO): 81 U/L (ref 37–153)
BUN/Creatinine Ratio: 20 (calc) (ref 6–22)
BUN: 20 mg/dL (ref 7–25)
CO2: 31 mmol/L (ref 20–32)
Calcium: 9.1 mg/dL (ref 8.6–10.4)
Chloride: 103 mmol/L (ref 98–110)
Creat: 0.99 mg/dL — ABNORMAL HIGH (ref 0.60–0.95)
Globulin: 2.3 g/dL (ref 1.9–3.7)
Glucose, Bld: 116 mg/dL (ref 65–139)
Potassium: 4.3 mmol/L (ref 3.5–5.3)
Sodium: 141 mmol/L (ref 135–146)
Total Bilirubin: 0.9 mg/dL (ref 0.2–1.2)
Total Protein: 5.3 g/dL — ABNORMAL LOW (ref 6.1–8.1)
eGFR: 55 mL/min/{1.73_m2} — ABNORMAL LOW (ref >=60)

## 2023-11-20 LAB — MAGNESIUM: Magnesium: 1.8 mg/dL (ref 1.5–2.5)

## 2023-11-20 NOTE — Progress Notes (Signed)
 Cardiology Office Note:  .   Date:  11/20/2023  ID:  Jill Preston, DOB 07-09-1933, MRN [id number] PCP: [name], NP  [facility] Providers Cardiologist:  Thurmon Fair, MD  History of Present Illness: .   Jill Preston is a 89 y.o. female with past medical history of CAD, chronic diastolic heart failure, chronic kidney disease, sick sinus syndrome, hypertension, hyperlipidemia, deconditioning.  Patient is followed by Dr. [name], presents today for a hospital follow-up appointment.  Patient is a same-day add-on appointment  Patient previously underwent right/left heart catheterization on 01/26/2015 that showed 70% stenosis in the left main, 60% stenosis in the ostial circumflex, 20% - 30% stenosis in proximal RCA.  In the light of the focal distal calcified left main stenosis with ostial circumflex encroachment, recommended surgical consultation for possible CABG.  Underwent coronary pressure/FFR study on 01/27/15 that showed moderate distal left main artery stenosis with FFR assessment suggesting that the lesion was not flow-limiting.  There is also moderate ostial circumflex stenosis.  Patient again underwent left heart catheterization on 04/06/2027 that again showed 70% eccentric distal left main stenosis, angiographically similar to 2016.  FFR 0.89 into the LAD and 0.86 into the circumflex, suggesting that the lesion is not flow-limiting.  There was otherwise nonobstructive CAD and a patent stent in the LAD.  Echocardiogram in 04/2020 showed EF 60-65% with no regional wall motion abnormalities, grade 2 diastolic dysfunction, normal RV systolic function.  Carotid ultrasounds in 10/2021 showed no significant carotid disease.  Patient was last seen by cardiology on 07/11/2023.  At that time, patient was under a lot of stress.  She had labile blood pressures associated with headaches.  She had recently been seen in the ED with elevated blood pressure and headache.  BP was  well-controlled when seen in clinic.  Patient remained on torsemide 25 mg daily, there is no evidence of volume overload.  Patient was deconditioned, had been using a walker for ambulation.  Patient was recently admitted from 2/25 - 3/1 for orthostatic hypotension, frequent falls.  She had reportedly had an upper respiratory infection a few days prior, and she presented complaining of generalized weakness, lightheadedness upon standing and multiple falls related to this.  Upon arrival to the ED, patient was afebrile, systolic BP was in the 90s. She was found to have a UTI.  She was given 500 mL normal saline and admitted for orthostatic hypotension, frequent falls, UTI.  Due to low blood pressure, her Imdur, Norvasc, and Demadex were held during this admission.  On discharge, there resumed at half their initial doses.  It was suspected that her frequent falls were likely due to orthostatic hypotension in the setting of UTI and electrolyte abnormalities.  Patient was hydrated with IV fluids and treated with antibiotics.  She underwent echocardiogram 11/13/2023 that showed EF 55-60% with grade 2 diastolic dysfunction, normal RV systolic function, mild-moderate atrial dilation, mild mitral valve regurgitation. She was discharged on Imdur 15 mg daily, torsemide 20 mg daily, Norvasc 2.5 mg daily.  Patient was also found to have vertigo/BPPV during her admission.  Was started on meclizine 12.5 mg 3 times daily.  Patient was seen by her primary care provider yesterday.  Patient reported dizziness, vertigo, confusion.  She has been prescribed meclizine for her dizziness, but she was unsure if she was taking it.  Patient noted a significant weight gain to 20 pounds during her hospital stay.  Her appetite had been poor, and she had even been  eating smaller meals.  Her PCP checked lab work which showed K normal at 4.3, creatinine normal at 0.99, mag 1.8, sodium 141.  Hemoglobin 14.1, WBC 5.9.  BP at that time was  110/62.  Today, patient presents as a same day add-on for a hospital follow up appointment. She was recently admitted with frequent falls, weakness, and dizziness. Reports that she was told she had low potassium levels, high salt levels, and a urinary tract infection (UTI) in the hospital. The patient has been experiencing dizziness, particularly when lying down, and has been taking meclizine for this symptom. The patient has not had any falls since being discharged from the hospital. Denies dizziness upon standing. The patient's blood pressure is on the lower end of normal. The patient also has swelling in the ankles. The patient has no issues with breathing at night, but experiences shortness of breath when talking too much. The patient also reports a sore stomach and has been on antibiotics for her UTI. She denies chest pain, syncope, near syncope. Denies abdominal distention, orthopnea.   ROS: Reports dizziness when laying down. Denies chest pain, syncope, near syncope, abdminal distention, orthopnea. Does get short of breath when speaking   Studies Reviewed: .   Cardiac Studies & Procedures   ______________________________________________________________________________________________ CARDIAC CATHETERIZATION  CARDIAC CATHETERIZATION 04/05/2017  Narrative  LM lesion, 70 %stenosed.  Mid LAD lesion, 0 %stenosed at site of prior stent.  Ost Cx lesion, 60 %stenosed.  Prox RCA-1 lesion, 20 %stenosed.  Prox RCA-2 lesion, 30 %stenosed.  The left ventricular systolic function is normal.  LV end diastolic pressure is normal.  The left ventricular ejection fraction is 55-65% by visual estimate.  1. 70% eccentric distal left main stenosis. Angiographically similar to 2016. FFR 0.89 into the LAD and 0.86 into the LCx suggesting that this lesion is not flow limiting. 2. Otherwise nonobstructive CAD. Patent stent in LAD. 3. Normal LV function 4. Normal LVEDP  Plan: recommend continued  medical therapy.  Findings Coronary Findings Diagnostic  Dominance: Right  Left Main The lesion is eccentric. The lesion is calcified. Pressure wire/FFR was performed on the lesion. FFR: 0.86.  Left Anterior Descending Mid LAD lesion with no stenosis was previously treated.  Left Circumflex  First Obtuse Marginal Branch  Right Coronary Artery  Intervention  No interventions have been documented.   CARDIAC CATHETERIZATION  CARDIAC CATHETERIZATION 01/27/2015  Narrative  Ost Cx lesion, 60% stenosed.  LM lesion, 50% stenosed.  1. Moderate distal left main artery stenosis with fractional flow reserve assessment suggesting the lesion is not flow limiting. (FFR=0.91) 2. Moderate ostial Circumflex stenosis  Recommendations: Medical management of CAD. I would not pursue revascularization strategies at this time. Her chest pressure has been present continuously for months with no relief suggesting it is not cardiac related.  Findings Coronary Findings Diagnostic  Dominance: Right  Left Main discrete .   Pressure wire/FFR was performed on the lesion.  FFR: 0.91.  Left Anterior Descending Previously placed Mid LAD stent (unknown type) is patent.  Left Circumflex discrete .  Intervention  No interventions have been documented.   STRESS TESTS  MYOCARDIAL PERFUSION IMAGING 11/20/2016  Narrative  The left ventricular ejection fraction is normal (55-65%).  Nuclear stress EF: 62%.  T wave inversion of 4 mm was noted during stress in the II, III, aVF, V6, V5 and V4 leads. T wave inversion persisted.  There was no ST segment deviation noted during stress.  This is a low risk study.  No  reversible ischemia. RV uptake noted which could suggest elevated PA pressure. LVEF 62% with normal wall motion. This is a low risk study.   ECHOCARDIOGRAM  ECHOCARDIOGRAM COMPLETE 11/13/2023  Narrative ECHOCARDIOGRAM REPORT    Patient Name:   Jill Preston Date of Exam:  11/13/2023 Medical Rec #:  [id number]         Height:       65.0 in Accession #:    [id number]        Weight:       170.0 lb Date of Birth:  21-Dec-1932        BSA:          1.846 m Patient Age:    89 years          BP:           111/57 mmHg Patient Gender: F                 HR:           59 bpm. Exam Location:  Inpatient  Procedure: 2D Echo, Color Doppler and Cardiac Doppler (Both Spectral and Color Flow Doppler were utilized during procedure).  Indications:    Elevated Troponins  History:        Patient has prior history of Echocardiogram examinations, most recent 04/22/2020. CHF, CAD; Risk Factors:Hypertension and Dyslipidemia.  Sonographer:    Irving Burton Senior RDCS Referring Phys: [id number] DANIEL V THOMPSON  IMPRESSIONS   1. Left ventricular ejection fraction, by estimation, is 55 to 60%. The left ventricle has normal function. The left ventricle has no regional wall motion abnormalities. Left ventricular diastolic parameters are consistent with Grade II diastolic dysfunction (pseudonormalization). 2. Right ventricular systolic function is normal. The right ventricular size is normal. Tricuspid regurgitation signal is inadequate for assessing PA pressure. 3. Left atrial size was mild to moderately dilated. 4. The mitral valve is normal in structure. Mild mitral valve regurgitation. 5. The aortic valve is tricuspid. There is mild calcification of the aortic valve. Aortic valve regurgitation is not visualized. 6. The inferior vena cava is normal in size with <50% respiratory variability, suggesting right atrial pressure of 8 mmHg.  FINDINGS Left Ventricle: Left ventricular ejection fraction, by estimation, is 55 to 60%. The left ventricle has normal function. The left ventricle has no regional wall motion abnormalities. Strain imaging was not performed. The left ventricular internal cavity size was normal in size. There is no left ventricular hypertrophy. Left ventricular diastolic parameters  are consistent with Grade II diastolic dysfunction (pseudonormalization).  Right Ventricle: The right ventricular size is normal. No increase in right ventricular wall thickness. Right ventricular systolic function is normal. Tricuspid regurgitation signal is inadequate for assessing PA pressure.  Left Atrium: Left atrial size was mild to moderately dilated.  Right Atrium: Right atrial size was normal in size.  Pericardium: There is no evidence of pericardial effusion. Presence of epicardial fat layer.  Mitral Valve: The mitral valve is normal in structure. Mild mitral annular calcification. Mild mitral valve regurgitation.  Tricuspid Valve: The tricuspid valve is normal in structure. Tricuspid valve regurgitation is trivial.  Aortic Valve: The aortic valve is tricuspid. There is mild calcification of the aortic valve. Aortic valve regurgitation is not visualized.  Pulmonic Valve: The pulmonic valve was normal in structure. Pulmonic valve regurgitation is trivial.  Aorta: The aortic root and ascending aorta are structurally normal, with no evidence of dilitation.  Venous: The inferior vena cava is normal in size with less than  50% respiratory variability, suggesting right atrial pressure of 8 mmHg.  IAS/Shunts: No atrial level shunt detected by color flow Doppler.  Additional Comments: 3D imaging was not performed.   LEFT VENTRICLE PLAX 2D LVIDd:         4.40 cm   Diastology LVIDs:         3.00 cm   LV e' medial:    7.46 cm/s LV PW:         0.70 cm   LV E/e' medial:  11.4 LV IVS:        0.80 cm   LV e' lateral:   8.08 cm/s LVOT diam:     1.90 cm   LV E/e' lateral: 10.5 LV SV:         86 LV SV Index:   47 LVOT Area:     2.84 cm   RIGHT VENTRICLE RV S prime:     10.70 cm/s TAPSE (M-mode): 1.8 cm  LEFT ATRIUM             Index        RIGHT ATRIUM           Index LA diam:        3.70 cm 2.00 cm/m   RA Area:     13.80 cm LA Vol (A2C):   81.6 ml 44.20 ml/m  RA Volume:    31.90 ml  17.28 ml/m LA Vol (A4C):   61.5 ml 33.31 ml/m LA Biplane Vol: 70.8 ml 38.35 ml/m AORTIC VALVE LVOT Vmax:   137.00 cm/s LVOT Vmean:  89.000 cm/s LVOT VTI:    0.305 m  AORTA Ao Root diam: 2.90 cm Ao Asc diam:  2.90 cm  MITRAL VALVE MV Area (PHT): 2.55 cm    SHUNTS MV Decel Time: 298 msec    Systemic VTI:  0.30 m MV E velocity: 85.20 cm/s  Systemic Diam: 1.90 cm MV A velocity: 46.30 cm/s MV E/A ratio:  1.84  Arvilla Meres MD Electronically signed by Arvilla Meres MD Signature Date/Time: 11/13/2023/2:50:30 PM    Final          ______________________________________________________________________________________________      Risk Assessment/Calculations:             Physical Exam:   VS:  There were no vitals taken for this visit.   Wt Readings from Last 3 Encounters:  11/19/23 189 lb 12.8 oz (86.1 kg)  11/16/23 188 lb 7.9 oz (85.5 kg)  09/20/23 184 lb 3.2 oz (83.6 kg)    GEN: Elderly female, sitting upright in the chair. No acute distress.  NECK: No JVD CARDIAC: RRR, no murmurs, rubs, gallops. Radial pulses 2+bilaterally  RESPIRATORY:  Crackles in bilateral lung bases. Normal WOB on room air  ABDOMEN: Soft, non-tender, non-distended EXTREMITIES:  Trace edema in BLE; No deformity   ASSESSMENT AND PLAN: .    Hypotension Orthostatic Hypotension Frequent Falls  - Patient recently admitted from 2/25-3/1 after she presented with generalized weakness, lightheadedness upon standing, and multiple falls related to this. She had poor appetite and was found to have UTI. Systolic BP was in the 90s in the ED - Discharged on 1/2 of her usual BP regiment- amlodipine 2.5 mg daily, imdur 15 mg daily, torsemide 20 mg daily  - BP continues to be low-normal. Stop amlodipine - Continue imdur as this is helping prevent chest pain. Continue torsemide as this is managing her fluid  - If BP continues to be low, may need to stop imdur  as well - Patient lives with  her husband with dementia. They do have a caretaker during the day, but she is essentially alone at nights. I am concerned that if she develops hypovolemia/low BP again, she could have an unwitnessed fall  - Arranged close follow up to monitor BP   Chronic Diastolic Heart Failure  Mild MR  Hypokalemia and hyponatremia  - Most recent echocardiogram from 11/13/23 showed EF 55-60%, no regional wall motion abnormalities, grade II DD, normal RV function, mild MR - Patient reports gaining about 20 lbs during/since her recent admission. Usual dry weight is reportedly 170 lbs, now up to 190. On exam, she has normal WOB, crackles in lung bases, and trace lower extremity edema. Reports having some shortness of breath when speaking, but no shortness of breath noted during our discussion  - When in the hospital, patient had presented with K 2.7, sodium 132. With her low BP, recent electrolyte abnormalities and hypovolemia, I am hesitant to over-diurese her as above  - Ordered BNP. Yesterday, PCP checked labwork. K 4.3, Na 141, creatinine 0.99 - Continue torsemide 20 mg daily. If BP elevated above her baseline (around 200-300), may consider increasing torsemide dose briefly  - Continue potassium 20 meq daily   Coronary Artery disease  - Patient has remote history of stating of the LAD >10 years ago. Also has known moderate stenosis in the LM that has not been significant by FFR In 2018 - Patient denies chest pain  - Continue imdur, pravastatin. Not on BB due to history of bradycardia   HLD  - Continue pravastatin      Dispo: Follow up next week with APP to follow BP   Signed, Jonita Albee, PA-C

## 2023-11-20 NOTE — Patient Instructions (Signed)
 Medication Instructions:  Stop Amlodipine *If you need a refill on your cardiac medications before your next appointment, please call your pharmacy*  Lab Work: Today we are going to redraw a BNP If you have labs (blood work) drawn today and your tests are completely normal, you will receive your results only by: MyChart Message (if you have MyChart) OR A paper copy in the mail If you have any lab test that is abnormal or we need to change your treatment, we will call you to review the results.  Testing/Procedures: No testing  Follow-Up: At [facility], you and your health needs are our priority.  As part of our continuing mission to provide you with exceptional heart care, we have created designated Provider Care Teams.  These Care Teams include your primary Cardiologist (physician) and Advanced Practice Providers (APPs -  Physician Assistants and Nurse Practitioners) who all work together to provide you with the care you need, when you need it.  We recommend signing up for the patient portal called "MyChart".  Sign up information is provided on this After Visit Summary.  MyChart is used to connect with patients for Virtual Visits (Telemedicine).  Patients are able to view lab/test results, encounter notes, upcoming appointments, etc.  Non-urgent messages can be sent to your provider as well.   To learn more about what you can do with MyChart, go to [email or web address].    Your next appointment:   1 week(s)  Provider:   Any APP

## 2023-11-21 LAB — BRAIN NATRIURETIC PEPTIDE: BNP: 238.2 pg/mL — ABNORMAL HIGH (ref 0.0–100.0)

## 2023-11-22 DIAGNOSIS — I739 Peripheral vascular disease, unspecified: Secondary | ICD-10-CM | POA: Diagnosis not present

## 2023-11-22 DIAGNOSIS — I5032 Chronic diastolic (congestive) heart failure: Secondary | ICD-10-CM | POA: Diagnosis not present

## 2023-11-22 DIAGNOSIS — I951 Orthostatic hypotension: Secondary | ICD-10-CM | POA: Diagnosis not present

## 2023-11-22 DIAGNOSIS — I130 Hypertensive heart and chronic kidney disease with heart failure and stage 1 through stage 4 chronic kidney disease, or unspecified chronic kidney disease: Secondary | ICD-10-CM | POA: Diagnosis not present

## 2023-11-22 DIAGNOSIS — D696 Thrombocytopenia, unspecified: Secondary | ICD-10-CM | POA: Diagnosis not present

## 2023-11-22 DIAGNOSIS — N1831 Chronic kidney disease, stage 3a: Secondary | ICD-10-CM | POA: Diagnosis not present

## 2023-11-22 DIAGNOSIS — I495 Sick sinus syndrome: Secondary | ICD-10-CM | POA: Diagnosis not present

## 2023-11-22 DIAGNOSIS — I2510 Atherosclerotic heart disease of native coronary artery without angina pectoris: Secondary | ICD-10-CM | POA: Diagnosis not present

## 2023-11-22 DIAGNOSIS — E876 Hypokalemia: Secondary | ICD-10-CM | POA: Diagnosis not present

## 2023-11-22 DIAGNOSIS — J45909 Unspecified asthma, uncomplicated: Secondary | ICD-10-CM | POA: Diagnosis not present

## 2023-11-25 ENCOUNTER — Ambulatory Visit: Admitting: Emergency Medicine

## 2023-11-25 ENCOUNTER — Telehealth: Payer: Self-pay

## 2023-11-25 ENCOUNTER — Telehealth: Payer: Self-pay | Admitting: *Deleted

## 2023-11-25 NOTE — Telephone Encounter (Signed)
 Notify provider for any changes or go to ED

## 2023-11-25 NOTE — Telephone Encounter (Signed)
 Copied from CRM [id number]. Topic: Clinical - Home Health Verbal Orders >> Nov 25, 2023  9:07 AM Alfred Levins wrote: Caller/Agency: Gretchen with [facility]  Callback Number: [phone] Service Requested: Physical Therapy Frequency: 2w4, 1w4  Any new concerns about the patient? Yes   ... When I showed up patient had just had a fall in the bathroom (November 22, 2023  5pm)  the caregiver and I helped her up, there were no injuries and no complaints    Forwarded to [employer] as FYI

## 2023-11-25 NOTE — Telephone Encounter (Signed)
 Left message to call back

## 2023-11-25 NOTE — Telephone Encounter (Signed)
-----   Message from Jonita Albee sent at 11/22/2023 11:21 AM EST ----- Please tell patient that her BNP (marker of extra fluid in her system) is mildly elevated. This means that there is some extra fluid in her system. If she notices worsening swelling in her lower extremities, weight gain of greater than 3 lbs in one day or 5 lbs in one week, she can take an extra dose of toresmide 20 mg daily. If she is going to take additional torsemide, she needs to be aware of her BP. Make position changes slowly  We can reassess when she is seen this week  Thanks KJ

## 2023-11-26 ENCOUNTER — Other Ambulatory Visit: Payer: Self-pay

## 2023-11-26 ENCOUNTER — Telehealth: Payer: Self-pay

## 2023-11-26 NOTE — Patient Instructions (Signed)
 Visit Information  Thank you for taking time to visit with me today. Please don't hesitate to contact me if I can be of assistance to you before our next scheduled telephone appointment.  Our next appointment is by telephone on 12/04/23 at 11am  Following is a copy of your care plan:   Goals Addressed               This Visit's Progress     TOC Care Plan - Patient will report no readmissions in the next 30 days (pt-stated)        Current Barriers:  Unable to complete medication reconciliation related to son is managing medications and was not present (patient states he has updated her pill box) 11/26/23 son/daughter in law not present - patient reports she is unable to review medications but knows her son/daughter in law understand them and are putting them in pill box and reminder her to take them - Confirmed Amlodipine was discontinued   RNCM Clinical Goal(s):  Patient will work with the Care Management team over the next 30 days to address Transition of Care Barriers: Medication Management 11/26/23 Ongoing TOC calls for 30 days weekly  Patient will take all medications exactly as prescribed and patient or son will call provider for medication related questions as evidenced by patient/son report and health record Patient will attend all scheduled medical appointments: PCP appointment 11/19/23 and will follow up with cardiology to move up current appointment as evidenced by patient/son report and record 11/26/23 Per patient and health record, patient had PCP appointment 11/19/23 and cardiology appointment 11/20/23 - Amlodipine was discontinued  Patient /Caregiver/Son will notify this [facility] RN if they do not hear from Home health agency - 11/26/23 patient reports Home health PT was in the home today and she walked "all over the house" and states PT changed some items in her bathroom so she would not have to bend down so far Patient will use walker or wheelchair until she feels stronger and steady and  given okay by Home Health due to recent falls - 11/26/23 Patient confirms  Patient will report any signs/symptoms UTI 11/26/23 Patient denies Patient will change positions slowly and will report any ongoing or worsening vertigo 11/26/23 reinforced with patient   Interventions:  Transitions of Care:  Goal on track:  Yes. Doctor Visits  - discussed the importance of doctor visits PCP appointment 11/19/23 and need to scheduled cardiology appointment within 1 week of discharge (patient states she callled and left message) 11/26/23 completed Contacted Health RN/OT/PT - [facility] [phone] and requested they call to advise patient's son when they will be starting care - 11/26/23 patient reports care was started Reviewed Signs and symptoms of UTI and when to call MD - 11/26/23 Reinforced  Assessed patient's home status - Patient has paid caregiver, Pam 5 days a week, husband (who has dementia) is in the home, Son, Lelon Huh is staying nights and his wife is helping as needed. 11/26/23 no change Discussed TOC program with friend/neighbor, Gilda who got on the phone - Rod Holler states she will give son this RN's name/number - 11/26/23 confirmed   Falls Interventions:  (Status:  Goal on track:  Yes.) Short Term Goal  Advised patient of importance of notifying provider of falls 11/26/23 reinforced *Assessed for signs and symptoms of orthostatic hypotension3/11/25 reinforced *Educated to report any falls to PCP 11/26/23 reinforced - Patient states she doesn't want to have any more falls and is being careful  Patient  Goals/Self-Care Activities: 11/26/23 Ongoing for 30 days weekly follow up  Participate in Transition of Care Program/Attend [facility] scheduled calls Notify RN Care Manager of [facility] call rescheduling needs Take all medications as prescribed - son is managing and was not present to review during call Attend all scheduled provider appointments - PCP appointment is scheduled for 11/19/23 - Patient has  already called to reschedule cardiology appointment (should see in 1 week) - 11/26/23 patient and record confirm appointments were completed Report any falls with or without injury to MD  Report any worsening vertigo or new signs of confusion or symptoms related to UTI  Follow Up Plan:  Telephone follow up appointment with care management team member scheduled for:  12/04/23 11am  The patient has been provided with contact information for the care management team and has been advised to call with any health related questions or concerns.          The patient verbalized understanding of instructions, educational materials, and care plan provided today and DECLINED offer to receive copy of patient instructions, educational materials, and care plan.   Telephone follow up appointment with care management team member scheduled for:12/04/23 11am The patient has been provided with contact information for the care management team and has been advised to call with any health related questions or concerns.   Please call the care guide team at [phone] if you need to cancel or reschedule your appointment.   Please call the Suicide and Crisis Lifeline: 988 call the [place] National Suicide Prevention Lifeline: 603-520-2104 or TTY: 971-600-1067 TTY 651-600-5997) to talk to a trained counselor call 1-800-273-TALK (toll free, 24 hour hotline) call the [facility]: [phone] if you are experiencing a Mental Health or Behavioral Health Crisis or need someone to talk to.  Hilbert Odor RN, CCM [place]  VBCI-Population Health RN Care Manager [phone]

## 2023-11-26 NOTE — Telephone Encounter (Signed)
Son was returning call for results. Please advise  

## 2023-11-26 NOTE — Telephone Encounter (Signed)
 Patient identification verified by 2 forms. Marilynn Rail, RN    Called and spoke to patient son Gweneth Fritter provider message below  [place] verbalized understanding, no questions at this time

## 2023-11-26 NOTE — Patient Outreach (Signed)
 Care Management  Transitions of Care Program Transitions of Care Post-discharge week 2   11/26/2023 Name: Jill Preston MRN: [id number] DOB: 09-22-1932  Subjective: Jill Preston is a 89 y.o. year old female who is a primary care patient of [name], [name], NP. The Care Management team Engaged with patient Engaged with patient by telephone to assess and address transitions of care needs.   Consent to Services:  Patient was given information about care management services, agreed to services, and gave verbal consent to participate.   Assessment:    Patient states she is doing well and states home health was present today and walker her around the house and adjusted things in her bathroom so she would not have to bend so far. Patient states son and daughter in law continue to put medications in pill box and reminder her to take her medications. Patient was unable to review medications with this [facility] RN but states she knows her son is filling them correctly and confirmed Amlodipine was stopped. TOC RN encouraged patient to check BP related medication change. Patient reported weight today of 183 lbs and has come down from 186.5 and 184lbs.. Patient agrees to continue to monitor weight and record findings and states she will begin monitoring BPs and record those as well         SDOH Interventions    Flowsheet Row Telephone from 11/18/2023 in [place] [facility] to Hosp-Admission (Discharged) from 11/12/2023 in [facility] [facility] ED to Hosp-Admission (Discharged) from 11/12/2022 in [place] [facility]  SDOH Interventions     Food Insecurity Interventions Intervention Not Indicated Patient Declined  [Pt states that she receives Meels on Wheels] --  Housing Interventions Intervention Not Indicated -- Intervention Not Indicated  Transportation Interventions Intervention Not Indicated -- --  Utilities Interventions Intervention Not Indicated -- --         Goals Addressed               This Visit's Progress     TOC Care Plan - Patient will report no readmissions in the next 30 days (pt-stated)        Current Barriers:  Unable to complete medication reconciliation related to son is managing medications and was not present (patient states he has updated her pill box) 11/26/23 son/daughter in law not present - patient reports she is unable to review medications but knows her son/daughter in law understand them and are putting them in pill box and reminder her to take them - Confirmed Amlodipine was discontinued   RNCM Clinical Goal(s):  Patient will work with the Care Management team over the next 30 days to address Transition of Care Barriers: Medication Management 11/26/23 Ongoing TOC calls for 30 days weekly  Patient will take all medications exactly as prescribed and patient or son will call provider for medication related questions as evidenced by patient/son report and health record Patient will attend all scheduled medical appointments: PCP appointment 11/19/23 and will follow up with cardiology to move up current appointment as evidenced by patient/son report and record 11/26/23 Per patient and health record, patient had PCP appointment 11/19/23 and cardiology appointment 11/20/23 - Amlodipine was discontinued  Patient /Caregiver/Son will notify this [facility] RN if they do not hear from Home health agency - 11/26/23 patient reports Home health PT was in the home today and she walked "all over the house" and states PT changed some items in her bathroom so she  would not have to bend down so far Patient will use walker or wheelchair until she feels stronger and steady and given okay by Home Health due to recent falls - 11/26/23 Patient confirms  Patient will report any signs/symptoms UTI 11/26/23 Patient denies Patient will change positions slowly and will report any ongoing or worsening vertigo 11/26/23 reinforced with  patient   Interventions:  Transitions of Care:  Goal on track:  Yes. Doctor Visits  - discussed the importance of doctor visits PCP appointment 11/19/23 and need to scheduled cardiology appointment within 1 week of discharge (patient states she callled and left message) 11/26/23 completed Contacted Health RN/OT/PT - [facility] [phone] and requested they call to advise patient's son when they will be starting care - 11/26/23 patient reports care was started Reviewed Signs and symptoms of UTI and when to call MD - 11/26/23 Reinforced  Assessed patient's home status - Patient has paid caregiver, Pam 5 days a week, husband (who has dementia) is in the home, Son, Lelon Huh is staying nights and his wife is helping as needed. 11/26/23 no change Discussed TOC program with friend/neighbor, Gilda who got on the phone - Rod Holler states she will give son this RN's name/number - 11/26/23 confirmed   Falls Interventions:  (Status:  Goal on track:  Yes.) Short Term Goal  Advised patient of importance of notifying provider of falls 11/26/23 reinforced *Assessed for signs and symptoms of orthostatic hypotension3/11/25 reinforced *Educated to report any falls to PCP 11/26/23 reinforced - Patient states she doesn't want to have any more falls and is being careful  Patient Goals/Self-Care Activities: 11/26/23 Ongoing for 30 days weekly follow up  Participate in Transition of Care Program/Attend [facility] scheduled calls Notify RN Care Manager of TOC call rescheduling needs Take all medications as prescribed - son is managing and was not present to review during call Attend all scheduled provider appointments - PCP appointment is scheduled for 11/19/23 - Patient has already called to reschedule cardiology appointment (should see in 1 week) - 11/26/23 patient and record confirm appointments were completed Report any falls with or without injury to MD  Report any worsening vertigo or new signs of confusion or  symptoms related to UTI  Follow Up Plan:  Telephone follow up appointment with care management team member scheduled for:  12/04/23 11am  The patient has been provided with contact information for the care management team and has been advised to call with any health related questions or concerns.          Plan: Telephone follow up appointment with care management team member scheduled for: 12/04/23 The patient has been provided with contact information for the care management team and has been advised to call with any health related questions or concerns.   Hilbert Odor RN, CCM [place]  VBCI-Population Health RN Care Manager [phone]

## 2023-11-29 ENCOUNTER — Encounter: Payer: Self-pay | Admitting: Emergency Medicine

## 2023-11-29 ENCOUNTER — Ambulatory Visit: Attending: Emergency Medicine | Admitting: Emergency Medicine

## 2023-11-29 VITALS — BP 120/60 | HR 72 | Ht 65.0 in | Wt 184.0 lb

## 2023-11-29 DIAGNOSIS — I5032 Chronic diastolic (congestive) heart failure: Secondary | ICD-10-CM | POA: Diagnosis not present

## 2023-11-29 DIAGNOSIS — I951 Orthostatic hypotension: Secondary | ICD-10-CM | POA: Diagnosis not present

## 2023-11-29 DIAGNOSIS — I2510 Atherosclerotic heart disease of native coronary artery without angina pectoris: Secondary | ICD-10-CM | POA: Diagnosis not present

## 2023-11-29 DIAGNOSIS — R296 Repeated falls: Secondary | ICD-10-CM | POA: Diagnosis not present

## 2023-11-29 DIAGNOSIS — E785 Hyperlipidemia, unspecified: Secondary | ICD-10-CM

## 2023-11-29 NOTE — Progress Notes (Signed)
 Cardiology Office Note:    Date:  11/29/2023  ID:  Jill Preston, DOB 05-05-1933, MRN [id number] PCP: [name], NP  [facility] Providers Cardiologist:  Thurmon Fair, MD       Patient Profile:      Chief Complaint: 1 week follow-up for  History of Present Illness:  Jill Preston is a 89 y.o. female with visit-pertinent history of coronary artery disease, chronic diastolic heart failure, chronic kidney disease, sick sinus syndrome, hypertension, hyperlipidemia, deconditioning.  Patient previously underwent right/left heart catheterization on 01/2015 that showed 70% stenosis in the left main, 60% stenosis in the ostial circumflex, 20% - 30% stenosis in proximal RCA.  Recommended surgical consultation for possible CABG.  She underwent coronary pressure/FFR study on 01/2015 that showed moderate distal left main artery stenosis with FFR suggesting that the lesion was not flow-limiting.  The patient again underwent left heart catheterization on 03/2017 that again showed 70% eccentric distal left main stenosis, angiographically similar to the 2016.  FFR 0.89 into the LAD and 0.86 into the circumflex, suggesting lesions nonflow limiting.  There is otherwise nonobstructive CAD and a patent stent in the LAD.  Echocardiogram in 04/2020 showed LVEF 60-65%, no RWMA, grade 2 DD, normal RV function.  Carotid ultrasounds and 10/2021 showed no significant carotid disease.  Patient was recently admitted from 2/25-11/16/2023 for orthostatic hypotension, frequent falls.  She was found to have a UTI.  She was given 500 mL NS bolus and admitted.  Due to low blood pressure her Imdur, Norvasc, Demadex were held during this admission.  On discharge noted meds were resumed after initial doses amlodipine 2.5 mg daily, Imdur 15 mg daily, torsemide 20 mg daily.  It was suspected that her frequent falls are likely due to orthostatic hypotension in the setting of UTI.  She had her echocardiogram 11/13/2023 that  showed LVEF 55 to 60%, grade 2 DD, normal RV function, mild/moderate aortic dilation, mild mitral valve regurgitation.  She was last seen in office on 11/20/2023.  Patient reports gaining about 20 pounds during/since her recent admission.  Usual dry weight is reportedly 170 pounds now up to 190.  She had noted some SOB.  Her torsemide was not increased and continued at 20 mg daily due to the concern of hypovolemia/hypotension that could lead to an unwitnessed fall.  Blood pressure continues to be low normal.  Her amlodipine was discontinued.  BNP was ordered showing 238.2.  She was to follow-up in 1 week for blood pressure check.   Discussed the use of AI scribe software for clinical note transcription with the patient, who gave verbal consent to proceed.  Today the patient presents with her caregiver for one week follow-up.  Since the last visit, the patient has lost about ten pounds of fluid weight and reports improved breathing.  She notes her leg swelling has vastly improved and she is now able to walk around without pain.  The patient is diligent about monitoring her weight and blood pressure daily.  Her blood pressure at home has been gradually improving with average BPs 120s over 70s.  She has denied any falls, lightheadedness, dizziness, presyncope since her last visit.  She notes that her son handles her medications primarily and puts them in a pillbox for her.  She does report significant at home stress due to taking care of her husband who has dementia.  She notes that their 70th year wedding anniversary is coming up.  Her caregiver does help her  out with this as well.  Overall the patient is feeling much improved.  Her DOE is better and she is no longer experiencing any SOB at rest.  She is without any exertional angina, chest pains, orthopnea, PND, palpitations, lightheadedness, dizziness, syncope, near syncope hematochezia, melena.    Review of systems:  Please see the history of present  illness. All other systems are reviewed and otherwise negative.     Home Medications:    Current Meds  Medication Sig   cyanocobalamin (VITAMIN B12) 1000 MCG tablet Take 1 tablet (1,000 mcg total) by mouth daily.   ezetimibe (ZETIA) 10 MG tablet Take 1 tablet (10 mg total) by mouth every morning.   gabapentin (NEURONTIN) 300 MG capsule Take 1 capsule (300 mg total) by mouth at bedtime.   isosorbide mononitrate (IMDUR) 30 MG 24 hr tablet Take 0.5 tablets (15 mg total) by mouth daily.   latanoprost (XALATAN) 0.005 % ophthalmic solution Place 1 drop into both eyes at bedtime.   levothyroxine (SYNTHROID) 75 MCG tablet Take 1 tablet (75 mcg total) by mouth daily before breakfast.   meclizine (ANTIVERT) 12.5 MG tablet Take 1 tablet (12.5 mg total) by mouth 3 (three) times daily for 5 days, THEN 1 tablet (12.5 mg total) 3 (three) times daily as needed.   Nutritional Supplements (ENSURE HIGH PROTEIN) LIQD Take 1 Can by mouth daily. (Patient taking differently: Take 2 Cans by mouth 2 (two) times daily.)   nystatin cream (MYCOSTATIN) APPLY 1 APPLICATION TOPICALLY TWICE DAILY *REFILL REQUEST* (Patient taking differently: 1 Application 2 (two) times daily as needed (itch).)   OVER THE COUNTER MEDICATION Take 1 capsule by mouth daily. OmegaXL joint and muscle support   potassium chloride SA (KLOR-CON M) 20 MEQ tablet Take 1 tablet (20 mEq total) by mouth 2 (two) times daily.   pravastatin (PRAVACHOL) 80 MG tablet Take 1 tablet (80 mg total) by mouth at bedtime.   sertraline (ZOLOFT) 25 MG tablet Take 1 tablet (25 mg total) by mouth daily.   torsemide (DEMADEX) 20 MG tablet Take 1 tablet (20 mg total) by mouth daily.   Studies Reviewed:       Echocardiogram 11/13/2023 1. Left ventricular ejection fraction, by estimation, is 55 to 60%. The  left ventricle has normal function. The left ventricle has no regional  wall motion abnormalities. Left ventricular diastolic parameters are  consistent with Grade II  diastolic  dysfunction (pseudonormalization).   2. Right ventricular systolic function is normal. The right ventricular  size is normal. Tricuspid regurgitation signal is inadequate for assessing  PA pressure.   3. Left atrial size was mild to moderately dilated.   4. The mitral valve is normal in structure. Mild mitral valve  regurgitation.   5. The aortic valve is tricuspid. There is mild calcification of the  aortic valve. Aortic valve regurgitation is not visualized.   6. The inferior vena cava is normal in size with <50% respiratory  variability, suggesting right atrial pressure of 8 mmHg.  Risk Assessment/Calculations:             Physical Exam:   VS:  BP 120/60 (BP Location: Right Arm, Patient Position: Sitting, Cuff Size: Normal)   Pulse 72   Ht 5\' 5"  (1.651 m)   Wt 184 lb (83.5 kg)   SpO2 92%   BMI 30.62 kg/m    Wt Readings from Last 3 Encounters:  11/29/23 184 lb (83.5 kg)  11/20/23 192 lb (87.1 kg)  11/19/23 189 lb 12.8  oz (86.1 kg)    GEN: Well nourished, well developed in no acute distress NECK: No JVD; No carotid bruits CARDIAC: RRR, no murmurs, rubs, gallops RESPIRATORY:  Clear to auscultation without rales, wheezing or rhonchi  ABDOMEN: Soft, non-tender, non-distended EXTREMITIES:  No edema; No acute deformity      Assessment and Plan:  Hypotension / Orthostatic Hypotension / Frequent Falls  Patient recently admitted from 2/25-3/1 for weakness, falls, hypotension and discharged on half of her usual BP regimen - amlodipine 2.5 mg daily, imdur 15 mg daily, torsemide 20 mg daily  BP continued to be low-normal on during last OV and amlodipine was discontinued -Today her blood pressure is under excellent control at 120/60. -Her home blood pressure ranges 120s/60s and she has been without any symptoms concerning for hypotension or orthostatic hypotension.  She has been without any falls. -Continue Imdur 15 mg daily and torsemide 20 mg daily   Chronic Diastolic  Heart Failure  Most recent echocardiogram from 11/13/23 showed EF 55-60%, no RWMA, grade II DD, normal RV function, mild MR During last OV patient reported gaining about 20 lbs during/since her recent admission, up to 190 LBS. -Today she is clinically euvolemic and well compensated, NYHA class II -Estimated dry weight 175-180 lbs -Over the past week patient has lost 6 pounds of water weight and is currently weighing 184 lbs.  I am pleased with the amount that she has diuresed as she is currently without signs of overt heart failure with much improved DOE -PE revealed that are lungs clear to auscultation bilaterally and no LE edema -We spoke about if she notices worsening swelling in her lower extremities, weight gain of greater than 3 lbs in one day or 5 lbs in one week, she can take an extra dose of toresmide 20 mg daily - Continue torsemide 20 mg daily - Continue potassium 20 meq daily  -Wear compression stockings and limit sodium intake   Coronary Artery disease  S/p remote history of stenting of the LAD >10 years ago with a "jailed" diagonal is probably responsible for small area of anterolateral ischemia.  Has known moderate stenosis in the LM that has not been significant by FFR In 2018 -Today patient is without any anginal symptoms, no indication for further ischemic evaluation at this time - Continue imdur, pravastatin, zetia. Not on BB due to history of bradycardia    Hyperlipidemia LDL 45 on 1/25 -Continue pravastatin 80 mg daily and Zetia 10 mg daily             Dispo:  Return in about 3 months (around 02/29/2024).  Signed, Denyce Robert, NP

## 2023-11-29 NOTE — Patient Instructions (Signed)
 Medication Instructions:  NO CHANGES     Lab Work: NONE    Testing/Procedures: NONE   Follow-Up: At [facility], you and your health needs are our priority.  As part of our continuing mission to provide you with exceptional heart care, we have created designated Provider Care Teams.  These Care Teams include your primary Cardiologist (physician) and Advanced Practice Providers (APPs -  Physician Assistants and Nurse Practitioners) who all work together to provide you with the care you need, when you need it.  We recommend signing up for the patient portal called "MyChart".  Sign up information is provided on this After Visit Summary.  MyChart is used to connect with patients for Virtual Visits (Telemedicine).  Patients are able to view lab/test results, encounter notes, upcoming appointments, etc.  Non-urgent messages can be sent to your provider as well.   To learn more about what you can do with MyChart, go to [email or web address].    Your next appointment:   3 MONTHS  Provider:   Thurmon Fair, MD OR Rise Paganini, [place]  Other Instructions:

## 2023-12-04 ENCOUNTER — Telehealth: Payer: Self-pay

## 2023-12-04 ENCOUNTER — Other Ambulatory Visit: Payer: Self-pay

## 2023-12-04 NOTE — Progress Notes (Signed)
 This encounter was created in error - please disregard.

## 2023-12-04 NOTE — Addendum Note (Signed)
 Addended by: Hilbert Odor A on: 12/04/2023 12:58 PM   Modules accepted: Orders, Level of Service

## 2023-12-04 NOTE — Patient Outreach (Signed)
 Care Management  Transitions of Care Program Transitions of Care Post-discharge week 3  12/04/2023 Name: Jill Preston MRN: [id number] DOB: 1933-04-23  Subjective: Jill Preston is a 89 y.o. year old female who is a primary care patient of [name], [name], NP. The Care Management team was unable to reach the patient by phone to assess and address transitions of care needs.   Plan: Additional outreach attempts will be made to reach the patient enrolled in the [facility] Program (Post Inpatient/ED Visit).  Hilbert Odor RN, CCM [place]  VBCI-Population Health RN Care Manager [phone]

## 2023-12-05 ENCOUNTER — Telehealth: Payer: Self-pay

## 2023-12-05 ENCOUNTER — Ambulatory Visit: Payer: Self-pay | Admitting: Family

## 2023-12-05 NOTE — Telephone Encounter (Signed)
 Left message on voicemail for patient to return call when available

## 2023-12-05 NOTE — Telephone Encounter (Signed)
 Ilean China M  [facility] Clinical21 minutes ago (3:27 PM)   HA Fyi, see note   Albertson, [name] M21 minutes ago (3:27 PM)   HA   Chief Complaint: pain with urination Symptoms: burning    Disposition: [] ED /[] Urgent Care (no appt availability in office) / [x] Appointment(In office/virtual)/ []  [facility]/ [] Home Care/ [] Refused Recommended Disposition /[] [facility]/ []  Follow-up with PCP Additional Notes: Pt complains of painful urination for over a month now. The burning sensation has increased. Pt has lower back pain. Pt mentioned, 'i have some red dots down there." RN asked if blood and pt is not sure. No fever. Pt has app tomorrow morning at 0940. RN gave care advice and pt verbalized understanding.                       Reason for Disposition  Side (flank) or lower back pain present  Answer Assessment - Initial Assessment Questions 1. SYMPTOM: "What's the main symptom you're concerned about?" (e.g., frequency, incontinence)     Burning while urination  2. ONSET: "When did the  burning   start?"     Last month  3. PAIN: "Is there any pain?" If Yes, ask: "How bad is it?" (Scale: 1-10; mild, moderate, severe)     Yes, some pain  4. CAUSE: "What do you think is causing the symptoms?"     Possible UTI 5. OTHER SYMPTOMS: "Do you have any other symptoms?" (e.g., blood in urine, fever, flank pain, pain with urination)     Possible blood, back pain  Protocols used: Urinary Symptoms-A-AH         Message sent to [name], [name], NP

## 2023-12-05 NOTE — Telephone Encounter (Signed)
 Noted.

## 2023-12-05 NOTE — Patient Outreach (Signed)
 Care Management  Transitions of Care Program Transitions of Care Post-discharge week 3   12/05/2023 Name: Jill Preston MRN: [id number] DOB: 03-31-1933  Subjective: Jill Preston is a 89 y.o. year old female who is a primary care patient of [name], [name], NP. The Care Management team Engaged with patient Engaged with patient by telephone to assess and address transitions of care needs.   Consent to Services:  Patient was given information about care management services, agreed to services, and gave verbal consent to participate.   Assessment:     Patient confirmed seeing cardiology 11/29/23. Patient states son/daughter in law continue to manage medications without difficulty and patient unable to review medications during call. Patient reported sliding to the floor today when her knees gave out and stated a 2nd time her husband helped her to the floor - both times when her knees gave out. Patient denied actually fall or injury - PCP office was notified. Patient also reported burning with urination - PCP office was notified of this as well via conference call to office with this RN and patient. Patient states the only time she is experiencing the dizziness is at night when she goes from laying to standing. Encouraged patient to continue to work with Home Health and change positions slowly.       SDOH Interventions    Flowsheet Row Telephone from 11/18/2023 in [facility] to Hosp-Admission (Discharged) from 11/12/2023 in [facility] to Hosp-Admission (Discharged) from 11/12/2022 in [place] [facility]  SDOH Interventions     Food Insecurity Interventions Intervention Not Indicated Patient Declined  [Pt states that she receives [employer] on Wheels] --  Housing Interventions Intervention Not Indicated -- Intervention Not Indicated  Transportation Interventions Intervention Not Indicated -- --  Utilities Interventions Intervention  Not Indicated -- --        Goals Addressed               This Visit's Progress     TOC Care Plan - Patient will report no readmissions in the next 30 days (pt-stated)        Current Barriers:  Unable to complete medication reconciliation related to son is managing medications and was not present (patient states he has updated her pill box) 12/05/23 son/daughter in law not present - patient reports she is unable to review medications but knows her son/daughter in law understand them and are putting them in pill box and reminder her to take them  RNCM Clinical Goal(s):  Patient will work with the Care Management team over the next 30 days to address Transition of Care Barriers: Medication Management 12/05/23 Ongoing TOC calls for 30 days weekly  Patient will take all medications exactly as prescribed and patient or son will call provider for medication related questions as evidenced by patient/son report and health record Patient will attend all scheduled medical appointments: PCP appointment 11/19/23 and will follow up with cardiology to move up current appointment as evidenced by patient/son report and record 12/05/23  Patient /Caregiver/Son will notify this [facility] RN if they do not hear from Home health agency - 12/05/23 patient reports  home health is still coming  Patient will use walker or wheelchair until she feels stronger and steady and given okay by Home Health due to recent falls - 12/05/23 Patient confirms  Patient will report any signs/symptoms UTI 12/05/23 patient reported burning with urination - PCP notified Patient will change positions  slowly and will report any ongoing or worsening vertigo 12/05/23 reinforced with patient   Interventions:  Transitions of Care:  Goal on track:  Yes. Doctor Visits  - discussed the importance of doctor visits PCP appointment - 12/05/23 patient confirmed she also had cardiology appointment 11/29/23  Contacted Health RN/OT/PT - [facility] [phone] and requested they call to advise patient's son when they will be starting care - 12/05/23 patient reports home care continues Reviewed Signs and symptoms of UTI and when to call MD - 12/05/23 Patient reports burning with urination and PCP notified Assessed patient's home status - Patient has paid caregiver, Pam 5 days a week, husband (who has dementia) is in the home, Son, Lelon Huh is staying nights and his wife is helping as needed. 12/05/23 no change   Falls Interventions:  (Status:  Goal on track:  Yes.) Short Term Goal  Advised patient of importance of notifying provider of falls 12/05/23 reinforced - patient reported she slid to floor once and husband helped her down another time related to "my knees gave out" - PCP notified *Assessed for signs and symptoms of orthostatic hypotension 12/05/23 patient denies symptoms expect at night when changing positions from laying down to getting up *Educated to report any falls to PCP 12/05/23 reinforced - patient restates not a fall but slid to floor and husband helped her to the floor when knees gave out. = PCP notified 12/05/23  Patient Goals/Self-Care Activities: 12/05/23 Ongoing for 30 days weekly follow up  Participate in Transition of Care Program/Attend [facility] scheduled calls Notify RN Care Manager of TOC call rescheduling needs Take all medications as prescribed - son is managing and was not present to review during call - 12/05/23 patient reports son/daughter in law continue to manage medications/fill pill box Attend all scheduled provider appointments - patient confirmed 11/29/23 cardiology appointment Report any falls with or without injury to MD  Report any worsening vertigo or new signs of confusion or symptoms related to UTI - 12/05/23 patient reports burning with urination   Follow Up Plan:  Telephone follow up appointment with care management team member scheduled for:  12/13/23 10am  The patient has been provided with contact  information for the care management team and has been advised to call with any health related questions or concerns.          Plan: Telephone follow up appointment with care management team member scheduled for: 12/13/23 10am The patient has been provided with contact information for the care management team and has been advised to call with any health related questions or concerns.   Hilbert Odor RN, CCM [place]  VBC[facility]-Population Health RN Care Manager [phone]

## 2023-12-05 NOTE — Telephone Encounter (Signed)
  Chief Complaint: pain with urination Symptoms: burning   Disposition: [] ED /[] Urgent Care (no appt availability in office) / [x] Appointment(In office/virtual)/ []  [facility]/ [] Home Care/ [] Refused Recommended Disposition /[] [facility]/ []  Follow-up with PCP Additional Notes: Pt complains of painful urination for over a month now. The burning sensation has increased. Pt has lower back pain. Pt mentioned, 'i have some red dots down there." RN asked if blood and pt is not sure. No fever. Pt has app tomorrow morning at 0940. RN gave care advice and pt verbalized understanding.             Reason for Disposition  Side (flank) or lower back pain present  Answer Assessment - Initial Assessment Questions 1. SYMPTOM: "What's the main symptom you're concerned about?" (e.g., frequency, incontinence)     Burning while urination  2. ONSET: "When did the  burning   start?"     Last month  3. PAIN: "Is there any pain?" If Yes, ask: "How bad is it?" (Scale: 1-10; mild, moderate, severe)     Yes, some pain  4. CAUSE: "What do you think is causing the symptoms?"     Possible UTI 5. OTHER SYMPTOMS: "Do you have any other symptoms?" (e.g., blood in urine, fever, flank pain, pain with urination)     Possible blood, back pain  Protocols used: Urinary Symptoms-A-AH

## 2023-12-05 NOTE — Telephone Encounter (Signed)
 Will obtain urine specimen during visit.

## 2023-12-06 ENCOUNTER — Telehealth: Payer: Self-pay

## 2023-12-06 ENCOUNTER — Ambulatory Visit: Admitting: Adult Health

## 2023-12-06 ENCOUNTER — Encounter: Payer: Self-pay | Admitting: Adult Health

## 2023-12-06 VITALS — BP 128/78 | HR 60 | Temp 97.3°F | Resp 20 | Ht 65.0 in | Wt 184.0 lb

## 2023-12-06 DIAGNOSIS — I25118 Atherosclerotic heart disease of native coronary artery with other forms of angina pectoris: Secondary | ICD-10-CM

## 2023-12-06 DIAGNOSIS — F339 Major depressive disorder, recurrent, unspecified: Secondary | ICD-10-CM

## 2023-12-06 DIAGNOSIS — R300 Dysuria: Secondary | ICD-10-CM

## 2023-12-06 DIAGNOSIS — I5032 Chronic diastolic (congestive) heart failure: Secondary | ICD-10-CM | POA: Diagnosis not present

## 2023-12-06 LAB — POCT URINALYSIS DIPSTICK
Glucose, UA: NEGATIVE
Ketones, UA: NEGATIVE
Nitrite, UA: 0
Protein, UA: POSITIVE — AB
Spec Grav, UA: 1.01 (ref 1.010–1.025)
Urobilinogen, UA: NEGATIVE U/dL — AB
pH, UA: 7 (ref 5.0–8.0)

## 2023-12-06 NOTE — Telephone Encounter (Signed)
 Patient was seen today By Arrie Eastern -vargus,NP urine specimen collected usually takes 3 days to result.No available results for review.

## 2023-12-06 NOTE — Telephone Encounter (Signed)
Message routed to PCP [name], [name], NP  

## 2023-12-06 NOTE — Telephone Encounter (Signed)
 Noted.

## 2023-12-06 NOTE — Progress Notes (Signed)
 [facility] clinic  Provider:  Kenard Gower DNP  Code Status:  Limited DNR  Goals of Care:     12/06/2023    9:47 AM  Advanced Directives  Does Patient Have a Medical Advance Directive? Yes  Type of Advance Directive Healthcare Power of Attorney  Does patient want to make changes to medical advance directive? No - Patient declined  Copy of Healthcare Power of Attorney in Chart? Yes - validated most recent copy scanned in chart (See row information)     Chief Complaint  Patient presents with   Urinary Tract Infection    Burning while urination   Discussed the use of AI scribe software for clinical note transcription with the patient, who gave verbal consent to proceed.  HPI: Patient is a 89 y.o. female who presents with a burning sensation during urination. She is accompanied by a caregiver.  She has been experiencing a burning sensation during urination for approximately two weeks. There is no visible hematuria, but she describes the sensation as 'really thick.' She was treated for a urinary tract infection with antibiotics following a hospitalization a couple of weeks ago. No fever or chills are present, but she mentions dribbling and uses pull-ups and pads to manage urinary incontinence. She also experiences pruritus and describes a whitish discharge when urinating.  She has a history of chronic diastolic congestive heart failure and is currently taking Demadex (torsemide) 20 mg daily. There are no acute symptoms of heart failure exacerbation reported during this visit. No shortness of breath.  She has coronary artery disease and is taking Imdur 30 mg (half tablet daily) and pravastatin 80 mg daily, along with Zetia 10 mg daily for hyperlipidemia management. No current chest pain is reported.  She has a history of depression, which was exacerbated while caring for her husband. She is currently taking Zoloft 25 mg daily. No current feelings of depression are reported.  She  mentions a history of falls but reports no injuries from the last two incidents. She experiences pain in her middle lower back, which she associates with a previously placed rod. No recent injuries from falls are reported.  She engages in activities such as reading the Bible, watching the news, and crafting tissue box holders. She lives in [place] with her son and his daughter.        Past Medical History:  Diagnosis Date   Arthritis    right knee   Cancer (HCC) yrs ago   skin cancer removed from face   Chronic diastolic CHF (congestive heart failure) (HCC)    Chronic venous insufficiency    LEA VENOUS, 10/17/2011 - mild reflux in bilateral common femoral veins   CKD (chronic kidney disease), stage III (HCC)    Coronary artery disease    a. s/p PCI/BMS to prox LAD and balloon angioplasty to mLAD with suboptimal result in 2000. b. Abnl nuc 2012, cath 09/2011 - showed that the overall territory of potential ischemia was small and attributable to a moderately diseased small second diagonal artery. Med rx.   Dyspnea    with activity   Headache    History of blood transfusion 2017   Hyperlipidemia    Hypertension    Hypertensive heart disease    Hypothyroidism    Obesity    Pneumonia    several times   Stroke [facility])    pt. states she had "light stroke" in sept. 1980   Urinary incontinence     Past Surgical History:  Procedure  Laterality Date   ABDOMINAL HYSTERECTOMY  1970's   complete   ANKLE ARTHROSCOPY Right 07/10/2017   Procedure: ANKLE ARTHROSCOPY;  Surgeon: Park Liter, DPM;  Location: [facility] OR;  Service: Podiatry;  Laterality: Right;   BACK SURGERY  07/26/2016   cervical neck surgery, [facility]   BALLOON DILATION N/A 12/01/2021   Procedure: BALLOON DILATION;  Surgeon: Jeani Hawking, MD;  Location: [facility] ENDOSCOPY;  Service: Gastroenterology;  Laterality: N/A;   BLADDER SURGERY  2010   WITH MESH  bladder tach   bunion removal surgery Bilateral 15 yrs ago    CARDIAC CATHETERIZATION Left 09/25/2011   Medical management   CARDIAC CATHETERIZATION Left 03/25/2001   Normal LV function, LAD residual narrowing of less than 10%, normal ramus intermediate, circumflex, and RCA,    CARDIAC CATHETERIZATION  09/04/1999   LAD, 3x41mm Tetra stent resulting in a reduction of the 80% stenosis to 0% residual   CARDIAC CATHETERIZATION N/A 01/26/2015   Procedure: Right/Left Heart Cath and Coronary Angiography;  Surgeon: Lennette Bihari, MD;  Location: [facility] INVASIVE CV LAB;  Service: Cardiovascular;  Laterality: N/A;   CARDIAC CATHETERIZATION N/A 01/27/2015   Procedure: Intravascular Pressure Wire/FFR Study;  Surgeon: Kathleene Hazel, MD;  Location: [facility] INVASIVE CV LAB;  Service: Cardiovascular;  Laterality: N/A;   CARDIAC CATHETERIZATION N/A 01/27/2015   Procedure: Right Heart Cath;  Surgeon: Kathleene Hazel, MD;  Location: [facility] INVASIVE CV LAB;  Service: Cardiovascular;  Laterality: N/A;   CHOLECYSTECTOMY     COLONOSCOPY WITH PROPOFOL N/A 03/07/2017   Procedure: COLONOSCOPY WITH PROPOFOL;  Surgeon: Charna Elizabeth, MD;  Location: [facility];  Service: Endoscopy;  Laterality: N/A;   CORONARY PRESSURE/FFR STUDY N/A 04/05/2017   Procedure: Intravascular Pressure Wire/FFR Study;  Surgeon: Swaziland, Peter M, MD;  Location: [facility] INVASIVE CV LAB;  Service: Cardiovascular;  Laterality: N/A;   CORONARY STENT PLACEMENT     LAD x 1   ESOPHAGOGASTRODUODENOSCOPY (EGD) WITH PROPOFOL N/A 12/01/2021   Procedure: ESOPHAGOGASTRODUODENOSCOPY (EGD) WITH PROPOFOL;  Surgeon: Jeani Hawking, MD;  Location: [facility];  Service: Gastroenterology;  Laterality: N/A;   ganglion cyst removal  yrs ago   x 2   ILIAC VEIN ANGIOPLASTY / STENTING  02/15/2015   IVC FILTER INSERTION  2016   IVC FILTER REMOVAL  02/15/2015   at [facility]   LEFT HEART CATH AND CORONARY ANGIOGRAPHY N/A 04/05/2017   Procedure: Left Heart Cath and Coronary Angiography;  Surgeon: Swaziland, Peter M, MD;  Location: [facility] INVASIVE CV  LAB;  Service: Cardiovascular;  Laterality: N/A;   LEFT HEART CATHETERIZATION WITH CORONARY ANGIOGRAM N/A 09/25/2011   Procedure: LEFT HEART CATHETERIZATION WITH CORONARY ANGIOGRAM;  Surgeon: Thurmon Fair, MD;  Location: [facility] CATH LAB;  Service: Cardiovascular;  Laterality: N/A;   multiple bladder surgeries to remove mesh     ROTATOR CUFF REPAIR Right    stent to groin Left 08/2014   left leg   TENDON REPAIR Right 07/10/2017   Procedure: RIGHT PERONEAL TENDON REPAIR;  Surgeon: Park Liter, DPM;  Location: [facility] OR;  Service: Podiatry;  Laterality: Right;   TENDON REPAIR Right 05/27/2019   Procedure: PERONEAL TENDON REPAIR x2;  Surgeon: Park Liter, DPM;  Location: [facility] ORS;  Service: Podiatry;  Laterality: Right;    Allergies  Allergen Reactions   Atorvastatin Anaphylaxis and Other (See Comments)    Patient does not remember; caused pneumonia- took her off of it per patient   Penicillins Anaphylaxis, Hives, Swelling and Other (See Comments)  Tongue swelling Did it involve swelling of the face/tongue/throat, SOB, or low BP? Yes Did it involve sudden or severe rash/hives, skin peeling, or any reaction on the inside of your mouth or nose? Yes Did you need to seek medical attention at a hospital or doctor's office? Yes When did it last happen?  58 or 89 years old    If all above answers are "NO", may proceed with cephalosporin use.    Shellfish Allergy Anaphylaxis, Nausea And Vomiting and Other (See Comments)    Severe nausea and vomiting   Aminophylline Itching, Swelling, Rash and Other (See Comments)    Pt experienced burning urination, itching, redness/rash, and swelling of genitals after given this medication via IV push.   Iodinated Contrast Media Swelling and Other (See Comments)    FLUSHING, also; LLE became swollen   Latex Other (See Comments)    Causes blisters   Oxybutynin Chloride Other (See Comments)    "blisters"   Propoxyphene Other (See Comments)    Unknown reaction    Vancomycin Hives and Other (See Comments)    06/02/2018- Hives arm, back and chest   Adhesive [Tape] Rash and Other (See Comments)    Paper tape is ok   Betadine [Povidone Iodine] Rash   Ciprofloxacin Hives   Codeine Nausea And Vomiting    .   Ranolazine Other (See Comments)    Constipation  .    Outpatient Encounter Medications as of 12/06/2023  Medication Sig   cyanocobalamin (VITAMIN B12) 1000 MCG tablet Take 1 tablet (1,000 mcg total) by mouth daily.   ezetimibe (ZETIA) 10 MG tablet Take 1 tablet (10 mg total) by mouth every morning.   gabapentin (NEURONTIN) 300 MG capsule Take 1 capsule (300 mg total) by mouth at bedtime.   isosorbide mononitrate (IMDUR) 30 MG 24 hr tablet Take 0.5 tablets (15 mg total) by mouth daily.   latanoprost (XALATAN) 0.005 % ophthalmic solution Place 1 drop into both eyes at bedtime.   levothyroxine (SYNTHROID) 75 MCG tablet Take 1 tablet (75 mcg total) by mouth daily before breakfast.   meclizine (ANTIVERT) 12.5 MG tablet Take 1 tablet (12.5 mg total) by mouth 3 (three) times daily for 5 days, THEN 1 tablet (12.5 mg total) 3 (three) times daily as needed.   Nutritional Supplements (ENSURE HIGH PROTEIN) LIQD Take 1 Can by mouth daily. (Patient taking differently: Take 2 Cans by mouth 2 (two) times daily.)   nystatin cream (MYCOSTATIN) APPLY 1 APPLICATION TOPICALLY TWICE DAILY *REFILL REQUEST* (Patient taking differently: 1 Application 2 (two) times daily as needed (itch).)   OVER THE COUNTER MEDICATION Take 1 capsule by mouth daily. OmegaXL joint and muscle support   potassium chloride SA (KLOR-CON M) 20 MEQ tablet Take 1 tablet (20 mEq total) by mouth 2 (two) times daily.   pravastatin (PRAVACHOL) 80 MG tablet Take 1 tablet (80 mg total) by mouth at bedtime.   sertraline (ZOLOFT) 25 MG tablet Take 1 tablet (25 mg total) by mouth daily.   torsemide (DEMADEX) 20 MG tablet Take 1 tablet (20 mg total) by mouth daily.   No facility-administered encounter  medications on file as of 12/06/2023.    Review of Systems:  Review of Systems  Constitutional:  Negative for appetite change, chills, fatigue and fever.  HENT:  Negative for congestion, hearing loss, rhinorrhea and sore throat.   Eyes: Negative.   Respiratory:  Negative for cough, shortness of breath and wheezing.   Cardiovascular:  Negative for chest pain, palpitations  and leg swelling.  Gastrointestinal:  Negative for abdominal pain, constipation, diarrhea, nausea and vomiting.  Genitourinary:  Positive for dysuria.  Musculoskeletal:  Negative for arthralgias, back pain and myalgias.  Skin:  Negative for color change, rash and wound.  Neurological:  Negative for dizziness, weakness and headaches.  Psychiatric/Behavioral:  Negative for behavioral problems. The patient is not nervous/anxious.     Health Maintenance  Topic Date Due   INFLUENZA VACCINE  12/16/2023 (Originally 04/18/2023)   Medicare Annual Wellness (AWV)  07/16/2024   DTaP/Tdap/Td (3 - Td or Tdap) 11/12/2032   Pneumonia Vaccine 34+ Years old  Completed   DEXA SCAN  Completed   HPV VACCINES  Aged Out   COVID-19 Vaccine  Discontinued   Zoster Vaccines- Shingrix  Discontinued    Physical Exam: Vitals:   12/06/23 0944  BP: 128/78  Pulse: 60  Resp: 20  Temp: (!) 97.3 F (36.3 C)  SpO2: 92%  Weight: 184 lb (83.5 kg)  Height: 5\' 5"  (1.651 m)   Body mass index is 30.62 kg/m. Physical Exam Constitutional:      General: She is not in acute distress.    Appearance: She is obese.  HENT:     Head: Normocephalic and atraumatic.     Nose: Nose normal.     Mouth/Throat:     Mouth: Mucous membranes are moist.  Eyes:     Conjunctiva/sclera: Conjunctivae normal.  Cardiovascular:     Rate and Rhythm: Normal rate and regular rhythm.  Pulmonary:     Effort: Pulmonary effort is normal.     Breath sounds: Normal breath sounds.  Abdominal:     General: Bowel sounds are normal.     Palpations: Abdomen is soft.   Musculoskeletal:        General: Normal range of motion.     Cervical back: Normal range of motion.  Skin:    General: Skin is warm and dry.  Neurological:     Mental Status: She is alert.  Psychiatric:        Mood and Affect: Mood normal.        Behavior: Behavior normal.        Thought Content: Thought content normal.        Judgment: Judgment normal.     Labs reviewed: Basic Metabolic Panel: Recent Labs    03/04/23 1448 04/23/23 1125 05/14/23 1102 09/20/23 1118 10/04/23 0933 11/15/23 0738 11/16/23 1025 11/19/23 1117  NA  --   --    < > 143   < > 135 138 141  K  --   --    < > 3.2*   < > 4.1 4.7 4.3  CL  --   --    < > 101   < > 95* 98 103  CO2  --   --    < > 34*   < > 29 30 31   GLUCOSE  --   --    < > 102   < > 139* 204* 116  BUN  --   --    < > 18   < > 18 18 20   CREATININE  --   --    < > 0.94   < > 0.83 0.86 0.99*  CALCIUM  --   --    < > 9.0   < > 9.1 9.5 9.1  MG  --   --   --   --    < > 1.7 1.7 1.8  TSH 0.20* 0.61  --  1.44  --   --   --   --    < > = values in this interval not displayed.   Liver Function Tests: Recent Labs    11/13/23 0651 11/14/23 0703 11/15/23 0738 11/19/23 1117  AST 60* 50* 47* 42*  ALT 28 28 28 26   ALKPHOS 59 66 66  --   BILITOT 0.9 0.6 0.6 0.9  PROT 5.0* 4.9* 5.3* 5.3*  ALBUMIN 2.2* 2.1* 2.2*  --    No results for input(s): "LIPASE", "AMYLASE" in the last 8760 hours. Recent Labs    11/15/23 1314  AMMONIA 27   CBC: Recent Labs    09/20/23 1118 11/12/23 1407 11/13/23 0651 11/15/23 0738 11/16/23 0840 11/19/23 1117  WBC 5.0 5.0   < > 4.5 4.7 5.9  NEUTROABS 3,365 3.6  --   --   --  4,289  HGB 14.4 15.1*   < > 14.0 14.3 14.1  HCT 43.6 43.6   < > 41.6 42.1 42.2  MCV 102.1* 96.0   < > 100.5* 99.5 100.7*  PLT 100* 56*   < > 71* 85* 175   < > = values in this interval not displayed.   Lipid Panel: Recent Labs    02/04/23 1156 09/20/23 1118  CHOL 125 122  HDL 64 63  LDLCALC 44 45  TRIG 90 59  CHOLHDL 2.0 1.9    Lab Results  Component Value Date   HGBA1C 6.1 (H) 01/26/2015    Procedures since last visit: ECHOCARDIOGRAM COMPLETE Result Date: 11/13/2023    ECHOCARDIOGRAM REPORT   Patient Name:   Jill Preston Date of Exam: 11/13/2023 Medical Rec #:  [id number]         Height:       65.0 in Accession #:    [id number]        Weight:       170.0 lb Date of Birth:  [birth year]        BSA:          1.846 m Patient Age:    89 years          BP:           111/57 mmHg Patient Gender: F                 HR:           59 bpm. Exam Location:  Inpatient Procedure: 2D Echo, Color Doppler and Cardiac Doppler (Both Spectral and Color            Flow Doppler were utilized during procedure). Indications:    Elevated Troponins  History:        Patient has prior history of Echocardiogram examinations, most                 recent 04/22/2020. CHF, CAD; Risk Factors:Hypertension and                 Dyslipidemia.  Sonographer:    Irving Burton Senior RDCS Referring Phys: [id number] DANIEL V THOMPSON IMPRESSIONS  1. Left ventricular ejection fraction, by estimation, is 55 to 60%. The left ventricle has normal function. The left ventricle has no regional wall motion abnormalities. Left ventricular diastolic parameters are consistent with Grade II diastolic dysfunction (pseudonormalization).  2. Right ventricular systolic function is normal. The right ventricular size is normal. Tricuspid regurgitation signal is inadequate for assessing PA pressure.  3. Left atrial size was mild to moderately  dilated.  4. The mitral valve is normal in structure. Mild mitral valve regurgitation.  5. The aortic valve is tricuspid. There is mild calcification of the aortic valve. Aortic valve regurgitation is not visualized.  6. The inferior vena cava is normal in size with <50% respiratory variability, suggesting right atrial pressure of 8 mmHg. FINDINGS  Left Ventricle: Left ventricular ejection fraction, by estimation, is 55 to 60%. The left ventricle has normal function.  The left ventricle has no regional wall motion abnormalities. Strain imaging was not performed. The left ventricular internal cavity  size was normal in size. There is no left ventricular hypertrophy. Left ventricular diastolic parameters are consistent with Grade II diastolic dysfunction (pseudonormalization). Right Ventricle: The right ventricular size is normal. No increase in right ventricular wall thickness. Right ventricular systolic function is normal. Tricuspid regurgitation signal is inadequate for assessing PA pressure. Left Atrium: Left atrial size was mild to moderately dilated. Right Atrium: Right atrial size was normal in size. Pericardium: There is no evidence of pericardial effusion. Presence of epicardial fat layer. Mitral Valve: The mitral valve is normal in structure. Mild mitral annular calcification. Mild mitral valve regurgitation. Tricuspid Valve: The tricuspid valve is normal in structure. Tricuspid valve regurgitation is trivial. Aortic Valve: The aortic valve is tricuspid. There is mild calcification of the aortic valve. Aortic valve regurgitation is not visualized. Pulmonic Valve: The pulmonic valve was normal in structure. Pulmonic valve regurgitation is trivial. Aorta: The aortic root and ascending aorta are structurally normal, with no evidence of dilitation. Venous: The inferior vena cava is normal in size with less than 50% respiratory variability, suggesting right atrial pressure of 8 mmHg. IAS/Shunts: No atrial level shunt detected by color flow Doppler. Additional Comments: 3D imaging was not performed.  LEFT VENTRICLE PLAX 2D LVIDd:         4.40 cm   Diastology LVIDs:         3.00 cm   LV e' medial:    7.46 cm/s LV PW:         0.70 cm   LV E/e' medial:  11.4 LV IVS:        0.80 cm   LV e' lateral:   8.08 cm/s LVOT diam:     1.90 cm   LV E/e' lateral: 10.5 LV SV:         86 LV SV Index:   47 LVOT Area:     2.84 cm  RIGHT VENTRICLE RV S prime:     10.70 cm/s TAPSE (M-mode): 1.8 cm  LEFT ATRIUM             Index        RIGHT ATRIUM           Index LA diam:        3.70 cm 2.00 cm/m   RA Area:     13.80 cm LA Vol (A2C):   81.6 ml 44.20 ml/m  RA Volume:   31.90 ml  17.28 ml/m LA Vol (A4C):   61.5 ml 33.31 ml/m LA Biplane Vol: 70.8 ml 38.35 ml/m  AORTIC VALVE LVOT Vmax:   137.00 cm/s LVOT Vmean:  89.000 cm/s LVOT VTI:    0.305 m  AORTA Ao Root diam: 2.90 cm Ao Asc diam:  2.90 cm MITRAL VALVE MV Area (PHT): 2.55 cm    SHUNTS MV Decel Time: 298 msec    Systemic VTI:  0.30 m MV E velocity: 85.20 cm/s  Systemic Diam: 1.90 cm MV A  velocity: 46.30 cm/s MV E/A ratio:  1.84 Arvilla Meres MD Electronically signed by Arvilla Meres MD Signature Date/Time: 11/13/2023/2:50:30 PM    Final    DG Chest 2 View Result Date: 11/12/2023 CLINICAL DATA:  Weakness. EXAM: CHEST - 2 VIEW COMPARISON:  11/12/2022. FINDINGS: Bilateral lung fields are clear. Bilateral costophrenic angles are clear. Stable cardio-mediastinal silhouette. No acute osseous abnormalities. The soft tissues are within normal limits. IMPRESSION: No active cardiopulmonary disease. Electronically Signed   By: Jules Schick M.D.   On: 11/12/2023 16:31   CT Head Wo Contrast Result Date: 11/12/2023 CLINICAL DATA:  Bilateral leg weakness for 1 week, increased falls and dizziness EXAM: CT HEAD WITHOUT CONTRAST TECHNIQUE: Contiguous axial images were obtained from the base of the skull through the vertex without intravenous contrast. RADIATION DOSE REDUCTION: This exam was performed according to the departmental dose-optimization program which includes automated exposure control, adjustment of the mA and/or kV according to patient size and/or use of iterative reconstruction technique. COMPARISON:  06/30/2023 FINDINGS: Brain: Stable chronic small-vessel ischemic changes throughout the periventricular white matter and basal ganglia. No evidence of acute infarct or hemorrhage. Lateral ventricles and midline structures are grossly unremarkable.  There is a stable 7 mm posterior falcine meningioma, without mass effect. No acute extra-axial fluid collections. Vascular: Stable atherosclerosis.  No hyperdense vessel. Skull: Normal. Negative for fracture or focal lesion. Sinuses/Orbits: No acute finding. Other: None. IMPRESSION: 1. No acute intracranial process. 2. Stable chronic small-vessel ischemic changes throughout the white matter and basal ganglia. 3. Stable small left posterior para falcine meningioma measuring 7 mm. No mass effect. Electronically Signed   By: Sharlet Salina M.D.   On: 11/12/2023 16:05    Assessment/Plan  1. Dysuria (Primary) -  Urinary incontinence may contribute to recurrent UTIs. Possible yeast infection indicated by itching and white discharge. - Send urine for culture and sensitivity. - Advise frequent pad changes. - Instruct timed bathroom visits every two hours - POC Urinalysis Dipstick - Urine Culture  2. Chronic diastolic CHF (congestive heart failure) (HCC) -  Managed with Demadex 20 mg daily. No new symptoms.  3. Coronary artery disease involving native coronary artery of native heart with other form of angina pectoris (HCC) -  Managed with Imdur 30 mg daily, Pravastatin and Zetia. No current chest pain.  4. Recurrent major depressive disorder, remission status unspecified (HCC) -  Previously exacerbated by caregiving stress. No current symptoms on Zoloft 25 mg daily.    General Health Maintenance Engages in cognitive activities to maintain function. - Advise to avoid sweets and drink water with no sugar. - Encourage cognitive activities.      Labs/tests ordered:   POC urine dipstick, urine culture   Return if symptoms worsen or fail to improve.  Kenard Gower, NP

## 2023-12-06 NOTE — Telephone Encounter (Signed)
 Copied from CRM [id number]. Topic: Clinical - Home Health Verbal Orders >> Dec 06, 2023 11:45 AM Alfred Levins wrote: Caller/Agency: Leah with [facility] Callback Number: [phone] Service Requested: Physical Therapy Any new concerns about the patient? Yes....   1. Need results of urine culture,  2. She had two minor falls 12/05/23, please call and let me know if there is any changes in the way I should care for pt Jill Preston .    3. Her current weight is 180. And that is 2 lbs out of her parameters.   Please advise

## 2023-12-07 LAB — URINE CULTURE
MICRO NUMBER:: 16233199
Result:: NO GROWTH
SPECIMEN QUALITY:: ADEQUATE

## 2023-12-08 ENCOUNTER — Telehealth: Payer: Self-pay | Admitting: Physician Assistant

## 2023-12-08 MED ORDER — TORSEMIDE 20 MG PO TABS
20.0000 mg | ORAL_TABLET | Freq: Every day | ORAL | 11 refills | Status: DC
Start: 2023-12-08 — End: 2024-01-28

## 2023-12-08 NOTE — Telephone Encounter (Signed)
 Pt ran out of Torsemide 5 days ago. She is not short of breath but does note some leg edema and weight increase of 9 lbs. Refill sent to her pharmacy. Spoke with pharmacist. She is requesting refill too soon. It appears that the Rx earlier this month was sent to [facility] pharmacy.   Advised pt to take Torsemide 20 mg once daily starting today. If her weight/edema does not improve, she should call our office.  Tereso Newcomer, PA-C    12/08/2023 10:40 AM

## 2023-12-09 ENCOUNTER — Other Ambulatory Visit: Payer: Self-pay | Admitting: Adult Health

## 2023-12-09 DIAGNOSIS — B3731 Acute candidiasis of vulva and vagina: Secondary | ICD-10-CM

## 2023-12-09 MED ORDER — FLUCONAZOLE 150 MG PO TABS: 150.0000 mg | ORAL_TABLET | Freq: Once | ORAL | 0 refills | Status: AC

## 2023-12-09 NOTE — Progress Notes (Signed)
 Urine culture showed no growth, no UTI

## 2023-12-11 ENCOUNTER — Telehealth: Payer: Self-pay

## 2023-12-11 NOTE — Telephone Encounter (Signed)
 Copied from CRM [id number]. Topic: General - Other >> Dec 11, 2023 11:33 AM Maree Krabbe H wrote: Reason for CRM: Leah from [facility] is calling about the results of the patients fall and test results, she stated she has not received a callback, Leahs callback number is [phone].   Call returned to [facility] to inform her that patient was seen on 12/06/23 and final urine culture showed no UTI (this was discussed with the patient). Patient did mentioned that she was still having symptoms (see results note) and it appears that Monina sent over diflucan, nothing else to follow at this time. Leah verbalized understanding.

## 2023-12-13 ENCOUNTER — Other Ambulatory Visit: Payer: Self-pay

## 2023-12-13 ENCOUNTER — Telehealth: Payer: Self-pay

## 2023-12-13 NOTE — Patient Outreach (Signed)
 Care Management  Transitions of Care Program Transitions of Care Post-discharge week 4   12/13/2023 Name: Jill Preston MRN: [id number] DOB: 26-Nov-1932  Subjective: Jill Preston is a 89 y.o. year old female who is a primary care patient of [name], [name], NP. The Care Management team Engaged with patient Engaged with patient by telephone to assess and address transitions of care needs.   Consent to Services:  Patient was given information about care management services, agreed to services, and gave verbal consent to participate.   Assessment:     Patient reports she is feeling better after taking Prescribed Diflucan and denies any further burning or itching. Patient states she is taking her medications as son puts them in the pill box and states she does have the Torsemide and is taking this medication and reports weight today at 173.5lbs and BP 138/86 HR 71. Patient states he is feeling much better. Patient continues to decline full medication review stating her son and daughter in law manage this. TOC RN discussed patient's report of running out of Torsemide and would like to review medications with son and patient said she did not feel this was needed and son and daughter in law both work and son has an appointment today as well. TOC RN educated patient on importance of calling prior to running out of her medications and patient verbalized understanding. Patient reports she feels she is getting stronger and her brother in law brought her a small machine that she uses to exercise her legs and she is still working with home health PT. Patient agreed to another [facility] follow up call - scheduled 12/20/23 at 11am      SDOH Interventions    Flowsheet Row Telephone from 11/18/2023 in [place] [facility] ED to Hosp-Admission (Discharged) from 11/12/2023 in [place] [facility] to Hosp-Admission (Discharged) from 11/12/2022 in [place] [facility]  SDOH  Interventions     Food Insecurity Interventions Intervention Not Indicated Patient Declined  [Pt states that she receives Meels on Wheels] --  Housing Interventions Intervention Not Indicated -- Intervention Not Indicated  Transportation Interventions Intervention Not Indicated -- --  Utilities Interventions Intervention Not Indicated -- --        Goals Addressed               This Visit's Progress     TOC Care Plan - Patient will report no readmissions in the next 30 days (pt-stated)        Current Barriers:  Unable to complete medication reconciliation related to son is managing medications and was not present (patient states he has updated her pill box) 12/13/23 son/daughter in law not present - patient reports she is unable to review medications but knows her son/daughter in law understand them and are putting them in pill box and reminder her to take them - Reviewed with patient that she called triage regarding Torsemide and patient states she was out of the medication and now has the medication. Reviewed with patient importance of getting medication refilled before running out  RNCM Clinical Goal(s):  Patient will work with the Care Management team over the next 30 days to address Transition of Care Barriers: Medication Management 12/13/23 Ongoing TOC calls for 30 days weekly  Patient will take all medications exactly as prescribed and patient or son will call provider for medication related questions as evidenced by patient/son report and health record Patient will attend all scheduled medical  appointments: PCP appointment completed and follow up done 12/06/23 Patient /Caregiver/Son will notify this [facility] RN if they do not hear from Home health agency - 12/13/23 patient reports  home health is still coming - patient states PT was there yesterday Patient will use walker or wheelchair until she feels stronger and steady and given okay by Home Health due to recent falls - 12/13/23 Patient  states she still has some dizziness - patient states she has started sitting down to change her shirt Patient will report any signs/symptoms UTI 12/05/23 patient reported burning with urination - PCP notified (12/13/23 patient reports she took the prescribed Diflucan and issue has resolved-urine test negative for UTI) Patient will change positions slowly and will report any ongoing or worsening vertigo 12/13/23 reinforced with patient   Interventions:  Transitions of Care:  Goal on track:  Yes. Doctor Visits  - discussed the importance of doctor visits PCP appointment - 12/05/23 patient confirmed she also had cardiology appointment 11/29/23 - completed Contacted Health RN/OT/PT - [facility] [phone] and requested they call to advise patient's son when they will be starting care - 12/13/23 patient reports home care continues Reviewed Signs and symptoms of UTI and when to call MD - 12/05/23 Patient reports burning with urination and PCP notified - 12/13/23 addressed with Diflucan - resolved Assessed patient's home status - Patient has paid caregiver, Pam 5 days a week, husband (who has dementia) is in the home, Son, Lelon Huh is staying nights and his wife is helping as needed. 12/13/23 no change   Falls Interventions:  (Status:  Goal on track:  Yes.) Short Term Goal  Advised patient of importance of notifying provider of falls 12/05/23 reinforced - patient reported she slid to floor once and husband helped her down another time related to "my knees gave out" - PCP notified - (12/13/23 patient reports she now has chair lift - states she is making changes like sitting down when pulling shirt over her head) *Assessed for signs and symptoms of orthostatic hypotension 12/13/23 patient denies symptoms except at night when changing positions from laying down to getting up- patient reports she is using a small leg exercise equipment her brother in law gave her *Educated to report any falls to PCP  12/05/23 reinforced - patient restates not a fall but slid to floor and husband helped her to the floor when knees gave out. = PCP notified 12/05/23 - addressed  Patient Goals/Self-Care Activities: 12/13/23 Ongoing for 30 days weekly follow up  Participate in Transition of Care Program/Attend [facility] scheduled calls Notify RN Care Manager of TOC call rescheduling needs Take all medications as prescribed - son is managing and was not present to review during call - 12/13/23 patient reports son/daughter in law continue to manage medications/fill pill box Attend all scheduled provider appointments - patient confirmed 11/29/23 cardiology appointment Report any falls with or without injury to MD  Report any worsening vertigo or new signs of confusion or symptoms related to UTI - 12/05/23 patient reports burning with urination  - resolved - took diflucan and reports no further issues  Follow Up Plan:  Telephone follow up appointment with care management team member scheduled for:  12/20/23 11am  The patient has been provided with contact information for the care management team and has been advised to call with any health related questions or concerns.          Plan: Telephone follow up appointment with care management team member scheduled for: 12/20/23  11am The patient has been provided with contact information for the care management team and has been advised to call with any health related questions or concerns.   Hilbert Odor RN, CCM [place]  VBCI-Population Health RN Care Manager [phone]

## 2023-12-19 ENCOUNTER — Telehealth: Payer: Self-pay

## 2023-12-19 NOTE — Telephone Encounter (Signed)
 Copied from CRM [id number]. Topic: Clinical - Medical Advice >> Dec 19, 2023  1:48 PM Suzette B wrote: Reason for CRM: Elmira from [facility] has called in regard to patient with weight monitoring. She stated the weight for each visit thus far has shown between 176 or less and the perimeter had been set at 182 to 192, she is needing to know if their should be an update on the perimeter of what the patient has been showing or should it be kept at the current perimeter. Please call back at [phone], she states it is a secure line so if you call and there's no answer please leave a message. Please Advise  Message sent to [name], [name], NP

## 2023-12-19 NOTE — Telephone Encounter (Signed)
 Patient's weight parameter managed by Cardiologist.Please send message to cardiologist office.

## 2023-12-19 NOTE — Telephone Encounter (Signed)
 Copied from CRM [id number]. Topic: Clinical - Medical Advice >> Dec 19, 2023  1:48 PM Suzette B wrote: Reason for CRM: Elmira from [facility] has called in regard to patient with weight monitoring. She stated the weight for each visit thus far has shown between 176 or less and the perimeter had been set at 182 to 192, she is needing to know if their should be an update on the perimeter of what the patient has been showing or should it be kept at the current perimeter. Please call back at [phone], she states it is a secure line so if you call and there's no answer please leave a message. Please Advise   Message sent to the Cardiologist Croitiru, Rachelle Hora, MD per [name], [name], NP request.

## 2023-12-19 NOTE — Telephone Encounter (Signed)
 P;ease reset the parameter for weight gain at 180 lb

## 2023-12-20 ENCOUNTER — Telehealth: Payer: Self-pay

## 2023-12-20 ENCOUNTER — Other Ambulatory Visit: Payer: Self-pay

## 2023-12-20 NOTE — Patient Outreach (Signed)
 Care Management  Transitions of Care Program Transitions of Care Post-discharge Week 5    12/20/2023 Name: Jill Preston MRN: [id number] DOB: 1933/04/01  Subjective: Jill Preston is a 89 y.o. year old female who is a primary care patient of Preston, Jill C, NP. The Care Management team Engaged with patient Engaged with patient by telephone to assess and address transitions of care needs.   Consent to Services:  Patient was given information about care management services, agreed to services, and gave verbal consent to participate.   Assessment:    Patient reported return of vaginal redness and TOC RN  requested CG, caregiver to assess vaginal area - CG, Jill Preston reports redness but states this still looks much better than before, denies discharge and states she feels the redness is due to patient not changing wet pad promptly - Educated patient and caregiver, GC to try timed voiding to limit wet pads and to change pads as soon as she wets them, encouraged patient/caregiver to notify MD with ongoing or worsening symptoms and that [facility] RN is getting to send a message to make MD aware -patient /caregiver deny need for further calls and feel they understand when to call MD with any issues and states patient has next PCP appointment 01/06/24 - patient reports weight stable at 173lbs      SDOH Interventions    Flowsheet Row Telephone from 11/18/2023 in [place] [facility] ED to Hosp-Admission (Discharged) from 11/12/2023 in [place] [facility] to Hosp-Admission (Discharged) from 11/12/2022 in [place] [facility]  SDOH Interventions     Food Insecurity Interventions Intervention Not Indicated Patient Declined  [Pt states that she receives Meels on Wheels] --  Housing Interventions Intervention Not Indicated -- Intervention Not Indicated  Transportation Interventions Intervention Not Indicated -- --  Utilities Interventions Intervention Not Indicated -- --         Goals Addressed               This Visit's Progress     TOC Care Plan - Patient will report no readmissions in the next 30 days (pt-stated)        Current Barriers: 12/20/23 TOC 30 day program completed - patient agreeable to program closure Unable to complete medication reconciliation related to son is managing medications and was not present (patient states he has updated her pill box) (12/20/23 patient continues to report son/daughter in law not present - patient reports she is unable to review medications but knows her son/daughter in law understand them and are putting them in pill box and reminder her to take them)  RNCM Clinical Goal(s): 12/20/23 TOC 30 day program completed - patient agreeable to program closure Patient will work with the Care Management team over the next 30 days to address Transition of Care Barriers: Medication Management 12/20/23 TOC 30 day program completed - patient agreeable to program closure and states her son and daughter are managing her medications Patient will take all medications exactly as prescribed and patient or son will call provider for medication related questions as evidenced by patient/son report and health record Patient will attend all scheduled medical appointments: PCP appointment completed and follow up done 12/06/23 Patient /Caregiver/Son will notify this [facility] RN if they do not hear from Home health agency - 12/20/23 patient reports  home health is still coming and agrees to [facility] program closure Patient will use walker or wheelchair until she feels stronger and steady and given  okay by Home Health due to recent falls - 12/20/23 Patient states she still has some dizziness but denies any falls in the past week  Patient will report any signs/symptoms UTI 12/05/23 patient reported burning with urination - PCP notified (12/13/23 patient reports she took the prescribed Diflucan and issue has resolved-urine test negative for UTI) (12/20/23 Patient reports she  has been cleaning vaginal area without drying and was educated on importance of drying vagina and educated that yeast grows in dark, moist areas - CG, Jill Preston was present and checked patient's vagina and reports redness but not raw and not bleeding, no drainage - Educated patient on changing pads immediately after wetting - CG states she feels it's the wetness from urine that is irritating the vagina - message sent to PCP Patient will change positions slowly and will report any ongoing or worsening vertigo 12/20/23 Patient reports no recent falls and she continues to change positions slowly.   Interventions:  Transitions of Care:  Goal Met  Yes.12/20/23 TOC 30 day program completed - patient agreeable to program closure Doctor Visits  - discussed the importance of doctor visits PCP appointment - 12/05/23 patient confirmed she also had cardiology appointment 11/29/23 - completed Contacted Health RN/OT/PT - [facility] [phone] and requested they call to advise patient's son when they will be starting care - 12/13/23 patient reports home care continues Reviewed Signs and symptoms of UTI and when to call MD - 12/05/23 Patient reports burning with urination and PCP notified - 12/13/23 addressed with Diflucan - resolved - 12/20/23 Patient/CG report redness and CG feels this is related more to urine on pad on skin - educated to change when wet and timed voiding - CG states this is still much better than before Assessed patient's home status - Patient has paid caregiver, Jill Preston 5 days a week, husband (who has dementia) is in the home, Son, Jill Preston is staying nights and his wife is helping as needed. 12/20/23 no change   Falls Interventions:  (Status:  Goal Met.) Short Term Goal  12/20/23 TOC 30 day program completed - patient agreeable to program closure  Advised patient of importance of notifying provider of falls 12/05/23 reinforced - patient reported she slid to floor once and husband helped her down another  time related to "my knees gave out" - PCP notified - (12/13/23 patient reports she now has chair lift - states she is making changes like sitting down when pulling shirt over her head) 12/20/23 patient denied further falls *Assessed for signs and symptoms of orthostatic hypotension 12/13/23 patient denies symptoms except at night when changing positions from laying down to getting up- patient reports she is using a small leg exercise equipment her brother in law gave her 12/20/23 Patient reports doing better *Educated to report any falls to PCP 12/05/23 reinforced - patient restates not a fall but slid to floor and husband helped her to the floor when knees gave out. = PCP notified 12/05/23 - addressed - 12/20/23 patient denied further falls  Patient Goals/Self-Care Activities: 12/20/23 TOC 30 day program completed - patient agreeable to program closure  Participate in Transition of Care Program/Attend [facility] scheduled calls Notify RN Care Manager of TOC call rescheduling needs Take all medications as prescribed - son is managing and was not present to review during call - 12/13/23 patient reports son/daughter in law continue to manage medications/fill pill box Attend all scheduled provider appointments - patient confirmed 11/29/23 cardiology appointment - completed Report any falls with  or without injury to MD  Report any worsening vertigo or new signs of confusion or symptoms related to UTI - 12/05/23 patient reports burning with urination  - resolved - took diflucan and reports no further issues 12/20/23 reviewed with patient and requested CG to assess vaginal area - CG, Jill Preston reports redness but states this still looks much better than before, denies discharge and states she feels the redness is due to patient not changing wet pad promptly - Educated patient and caregiver, GC to try timed voiding to limit wet pads and to change pads as soon as she wets them, encouraged patient/caregiver to notify MD with ongoing or  worsening symptoms and that [facility] RN is getting to send a message to make MD aware -patient /caregiver deny need for further calls and feel they understand when to call MD with any issues and states patient has next PCP appointment 01/06/24 - patient reports weight stable at 173lbs  Follow Up Plan:  12/20/23 TOC 30 day program completed - patient agreeable to program closure The patient has been provided with contact information for the care management team and has been advised to call with any health related questions or concerns.          Plan: TOC 30 day program closure  The patient has been provided with contact information for the care management team and has been advised to call with any health related questions or concerns.   Hilbert Odor RN, CCM [place]  VBCI-Population Health RN Care Manager [phone]

## 2023-12-20 NOTE — Telephone Encounter (Signed)
 Left a message on secure line to Elmira from [facility]

## 2023-12-23 ENCOUNTER — Telehealth: Payer: Self-pay | Admitting: *Deleted

## 2023-12-23 NOTE — Telephone Encounter (Signed)
 Copied from CRM [id number]. Topic: Clinical - Medical Advice >> Dec 20, 2023  4:29 PM Philippa Chester F wrote: Reason for CRM:   Patient is returning Rhonda's call in reference to a call she missed earlier today. Please call the patient back in your earliest convenience.    Callback Number: [phone]      Printed message and given to Pamelia Hoit to return call.

## 2023-12-26 ENCOUNTER — Telehealth: Payer: Self-pay | Admitting: Cardiovascular Disease

## 2023-12-26 NOTE — Telephone Encounter (Signed)
 Patient identification verified by 2 forms. Marilynn Rail, RN    Called and spoke to patient  Patient states:   -this morning BP was 101/53 Hr: 70  -Also this morning BP was 109/78  -at 12:30pm BP was 130/60  -she had some lightheaded/dizziness this morning   -symptoms have resolved   -sometimes symptoms return after getting up and walking   -Takes Imdur 30 mg daily  Advised patient:   -should be taking 15 mg of imdur, that could be causing dizziness symptoms   -changes positions slowly   -keep 4/22 OV for follow up  Patient verbalized understanding, no questions at this time

## 2023-12-26 NOTE — Telephone Encounter (Signed)
 Pt c/o BP issue: STAT if pt c/o blurred vision, one-sided weakness or slurred speech  1. What are your last 5 BP readings? 101/53, 109/78 and 130/60  2. Are you having any other symptoms (ex. Dizziness, headache, blurred vision, passed out)? No  3. What is your BP issue? Pt is requesting a callback due to her having concerns about her BP fluctuating. Please advise

## 2024-01-07 ENCOUNTER — Ambulatory Visit: Payer: PPO | Attending: Cardiovascular Disease | Admitting: Cardiovascular Disease

## 2024-01-07 ENCOUNTER — Encounter: Payer: Self-pay | Admitting: Cardiovascular Disease

## 2024-01-07 VITALS — BP 108/56 | HR 67 | Ht 65.0 in | Wt 181.0 lb

## 2024-01-07 DIAGNOSIS — E663 Overweight: Secondary | ICD-10-CM | POA: Diagnosis not present

## 2024-01-07 DIAGNOSIS — I25118 Atherosclerotic heart disease of native coronary artery with other forms of angina pectoris: Secondary | ICD-10-CM | POA: Diagnosis not present

## 2024-01-07 DIAGNOSIS — I10 Essential (primary) hypertension: Secondary | ICD-10-CM

## 2024-01-07 DIAGNOSIS — E785 Hyperlipidemia, unspecified: Secondary | ICD-10-CM

## 2024-01-07 DIAGNOSIS — I5032 Chronic diastolic (congestive) heart failure: Secondary | ICD-10-CM | POA: Diagnosis not present

## 2024-01-07 DIAGNOSIS — N182 Chronic kidney disease, stage 2 (mild): Secondary | ICD-10-CM | POA: Diagnosis not present

## 2024-01-07 NOTE — Progress Notes (Signed)
 Patient ID: Jill Preston, female   DOB: 1932/10/28, 89 y.o.   MRN: [id number]    Cardiology Office Note    Date:  01/07/2024   ID:  Jill Preston, DOB 09-Jan-1933, MRN [id number]  PCP:  Estil Heman, NP  Cardiologist:   Luana Rumple, MD   Chief Complaint  Patient presents with   Congestive Heart Failure        Coronary Artery Disease     History of Present Illness:  Jill Preston is a 89 y.o. female returning in follow-up for CAD and CHF.  Has a moderate stenosis of the left main coronary artery, not  hemodynamically significant by FFR analysis in 2016 and again in 2018.  She has chronic diastolic heart failure that responds well to diuretics, but excessive diuresis has occasionally led to acute kidney injury and severe hypokalemia.  She was hospitalized in late February for orthostatic hypotension tension in the setting of hypovolemia following an upper respiratory tract infection.  She received intravenous fluids and antibiotics.  She was also felt to have a component of benign positional vertigo and was started on some meclizine .  Possible discharge weight was 188 pounds, substantially higher than our previously estimated "dry weight".  She has not had any more falls since her last appointment.  She does occasionally get "woozy" when standing up in the morning".  She has not had any problems with severe leg edema, but both ankles do swell towards the end of the day.  She has not had any chest pain at rest or with activity (sometimes she has to pull up her husband Darrow End to get him from the bed to the wheelchair or to the body).  Emotionally, she is doing a lot better now that she has a day-time caregiver in the house helping her out with her husband.  She is close to the upper limit of our previously estimated dry weight of 180 pounds.  She does not have orthopnea or PND.  She has not had sufficient edema in her legs to cause weeping wounds, ulcers or blisters.  Recent  metabolic parameters from 02/04/2023 look great with an HDL of 64 and LDL of 44.  Labs checked last month at her appointment with Dr. Maria Shiner showed a creatinine of 0.9 and potassium of 3.3, BNP 238 (a little higher than her baseline which is around 120).  Her echocardiogram performed 11/13/2023 showed LVEF 55-60% and pseudonormalization of the mitral inflow, moderately dilated left atrium, no significant valvular abnormalities.  Her biggest problem remains the stress surrounding caring for her husband Darrow End and his progressive dementia.  This has been compounded by recurrent falls, spinal fracture, recent prolonged hospitalization with urinary tract infection related sepsis, DVT/PE and development of sacral ulcers with a prolonged stay in a nursing home.  He is now back home  .  She has a long history of coronary disease. She underwent proximal LAD bare-metal stenting and balloon angioplasty of the mid LAD in 2013. She has had persistent ischemia in the territory of the diagonal artery on nuclear stress test performed over the last few years. Coronary angiography performed May 2016 showed a widely patent LAD stent, 60% ostial circumflex stenosis, 20-30% right coronary artery stenosis. There was concern about possible left coronary artery stenosis but fractional flow reserve was normal (0.96 at baseline, 0.91 during intravenous adenosine  infusion). She had good anginal response to Ranexa  but developed severe constipation and could not tolerate the medication. She has been intolerant to  beta blockers due to bradycardia. She has preserved left ventricular systolic function but has had episodes of acute exacerbation of diastolic heart failure attributed to hypertensive heart disease. December 2015, she was critically ill with an acute left iliofemoral DVT with anticoagulation complicated by retroperitoneal hematoma and hemorrhagic shock, requiring placement of an inferior vena cava filter. The filter was removed  in June 2016. Coronary angiography in July 2018 showed unchanged anatomy of the eccentric stenosis in the left main coronary artery with noncritical FFR (0.89 in the LAD, 0.86 in the LCx).  Intravascular Pressure Wire/FFR Study  Left Heart Cath and Coronary Angiography  Conclusion     LM lesion, 70 %stenosed. Mid LAD lesion, 0 %stenosed at site of prior stent. Ost Cx lesion, 60 %stenosed. Prox RCA-1 lesion, 20 %stenosed. Prox RCA-2 lesion, 30 %stenosed. The left ventricular systolic function is normal. LV end diastolic pressure is normal. The left ventricular ejection fraction is 55-65% by visual estimate.   1. 70% eccentric distal left main stenosis. Angiographically similar to 2016. FFR 0.89 into the LAD and 0.86 into the LCx suggesting that this lesion is not flow limiting.  2. Otherwise nonobstructive CAD. Patent stent in LAD. 3. Normal LV function 4. Normal LVEDP   Plan: recommend continued medical therapy.     Past Medical History:  Diagnosis Date   Arthritis    right knee   Cancer (HCC) yrs ago   skin cancer removed from face   Chronic diastolic CHF (congestive heart failure) (HCC)    Chronic venous insufficiency    LEA VENOUS, 10/17/2011 - mild reflux in bilateral common femoral veins   CKD (chronic kidney disease), stage III (HCC)    Coronary artery disease    a. s/p PCI/BMS to prox LAD and balloon angioplasty to mLAD with suboptimal result in 2000. b. Abnl nuc 2012, cath 09/2011 - showed that the overall territory of potential ischemia was small and attributable to a moderately diseased small second diagonal artery. Med rx.   Dyspnea    with activity   Headache    History of blood transfusion 2017   Hyperlipidemia    Hypertension    Hypertensive heart disease    Hypothyroidism    Obesity    Pneumonia    several times   Stroke [facility])    pt. states she had "light stroke" in sept. 1980   Urinary incontinence     Past Surgical History:  Procedure Laterality  Date   ABDOMINAL HYSTERECTOMY  1970's   complete   ANKLE ARTHROSCOPY Right 07/10/2017   Procedure: ANKLE ARTHROSCOPY;  Surgeon: Camilo Cella, DPM;  Location: [facility] OR;  Service: Podiatry;  Laterality: Right;   BACK SURGERY  07/26/2016   cervical neck surgery, [facility]   BALLOON DILATION N/A 12/01/2021   Procedure: BALLOON DILATION;  Surgeon: Alvis Jourdain, MD;  Location: [facility] ENDOSCOPY;  Service: Gastroenterology;  Laterality: N/A;   BLADDER SURGERY  2010   WITH MESH  bladder tach   bunion removal surgery Bilateral 15 yrs ago   CARDIAC CATHETERIZATION Left 09/25/2011   Medical management   CARDIAC CATHETERIZATION Left 03/25/2001   Normal LV function, LAD residual narrowing of less than 10%, normal ramus intermediate, circumflex, and RCA,    CARDIAC CATHETERIZATION  09/04/1999   LAD, 3x30mm Tetra stent resulting in a reduction of the 80% stenosis to 0% residual   CARDIAC CATHETERIZATION N/A 01/26/2015   Procedure: Right/Left Heart Cath and Coronary Angiography;  Surgeon: Millicent Ally, MD;  Location: [facility] INVASIVE CV LAB;  Service: Cardiovascular;  Laterality: N/A;   CARDIAC CATHETERIZATION N/A 01/27/2015   Procedure: Intravascular Pressure Wire/FFR Study;  Surgeon: Odie Benne, MD;  Location: [facility] INVASIVE CV LAB;  Service: Cardiovascular;  Laterality: N/A;   CARDIAC CATHETERIZATION N/A 01/27/2015   Procedure: Right Heart Cath;  Surgeon: Odie Benne, MD;  Location: [facility] INVASIVE CV LAB;  Service: Cardiovascular;  Laterality: N/A;   CHOLECYSTECTOMY     COLONOSCOPY WITH PROPOFOL  N/A 03/07/2017   Procedure: COLONOSCOPY WITH PROPOFOL ;  Surgeon: Tami Falcon, MD;  Location: [facility];  Service: Endoscopy;  Laterality: N/A;   CORONARY PRESSURE/FFR STUDY N/A 04/05/2017   Procedure: Intravascular Pressure Wire/FFR Study;  Surgeon: Swaziland, Peter M, MD;  Location: [facility] INVASIVE CV LAB;  Service: Cardiovascular;  Laterality: N/A;   CORONARY STENT PLACEMENT     LAD x 1    ESOPHAGOGASTRODUODENOSCOPY (EGD) WITH PROPOFOL  N/A 12/01/2021   Procedure: ESOPHAGOGASTRODUODENOSCOPY (EGD) WITH PROPOFOL ;  Surgeon: Alvis Jourdain, MD;  Location: [facility];  Service: Gastroenterology;  Laterality: N/A;   ganglion cyst removal  yrs ago   x 2   ILIAC VEIN ANGIOPLASTY / STENTING  02/15/2015   IVC FILTER INSERTION  2016   IVC FILTER REMOVAL  02/15/2015   at [facility]   LEFT HEART CATH AND CORONARY ANGIOGRAPHY N/A 04/05/2017   Procedure: Left Heart Cath and Coronary Angiography;  Surgeon: Swaziland, Peter M, MD;  Location: [facility] INVASIVE CV LAB;  Service: Cardiovascular;  Laterality: N/A;   LEFT HEART CATHETERIZATION WITH CORONARY ANGIOGRAM N/A 09/25/2011   Procedure: LEFT HEART CATHETERIZATION WITH CORONARY ANGIOGRAM;  Surgeon: Luana Rumple, MD;  Location: [facility] CATH LAB;  Service: Cardiovascular;  Laterality: N/A;   multiple bladder surgeries to remove mesh     ROTATOR CUFF REPAIR Right    stent to groin Left 08/2014   left leg   TENDON REPAIR Right 07/10/2017   Procedure: RIGHT PERONEAL TENDON REPAIR;  Surgeon: Camilo Cella, DPM;  Location: [facility] OR;  Service: Podiatry;  Laterality: Right;   TENDON REPAIR Right 05/27/2019   Procedure: PERONEAL TENDON REPAIR x2;  Surgeon: Camilo Cella, DPM;  Location: [facility] ORS;  Service: Podiatry;  Laterality: Right;    Outpatient Medications Prior to Visit  Medication Sig Dispense Refill   amLODipine  (NORVASC ) 2.5 MG tablet Take 2.5 mg by mouth daily.     cyanocobalamin  (VITAMIN B12) 1000 MCG tablet Take 1 tablet (1,000 mcg total) by mouth daily.     ezetimibe  (ZETIA ) 10 MG tablet Take 1 tablet (10 mg total) by mouth every morning. 90 tablet 3   gabapentin  (NEURONTIN ) 300 MG capsule Take 1 capsule (300 mg total) by mouth at bedtime. 90 capsule 1   latanoprost  (XALATAN ) 0.005 % ophthalmic solution Place 1 drop into both eyes at bedtime. 2.5 mL 2   levothyroxine  (SYNTHROID ) 75 MCG tablet Take 1 tablet (75 mcg total) by mouth daily before breakfast. 90  tablet 1   meclizine  (ANTIVERT ) 12.5 MG tablet Take 1 tablet (12.5 mg total) by mouth 3 (three) times daily for 5 days, THEN 1 tablet (12.5 mg total) 3 (three) times daily as needed. 30 tablet 0   Nutritional Supplements (ENSURE HIGH PROTEIN) LIQD Take 1 Can by mouth daily. (Patient taking differently: Take 2 Cans by mouth 2 (two) times daily.)     OVER THE COUNTER MEDICATION Take 1 capsule by mouth daily. OmegaXL joint and muscle support     potassium chloride  SA (KLOR-CON  M) 20 MEQ tablet Take  1 tablet (20 mEq total) by mouth 2 (two) times daily.     pravastatin  (PRAVACHOL ) 80 MG tablet Take 1 tablet (80 mg total) by mouth at bedtime. 90 tablet 1   sertraline  (ZOLOFT ) 25 MG tablet Take 1 tablet (25 mg total) by mouth daily. 90 tablet 3   torsemide  (DEMADEX ) 20 MG tablet Take 1 tablet (20 mg total) by mouth daily. 30 tablet 11   isosorbide  mononitrate (IMDUR ) 30 MG 24 hr tablet Take 0.5 tablets (15 mg total) by mouth daily.     nystatin  cream (MYCOSTATIN ) APPLY 1 APPLICATION TOPICALLY TWICE DAILY *REFILL REQUEST* (Patient not taking: Reported on 01/07/2024) 30 g 2   No facility-administered medications prior to visit.     Allergies:   Atorvastatin, Penicillins, Shellfish allergy, Aminophylline , Iodinated contrast media, Latex, Oxybutynin chloride, Propoxyphene, Vancomycin , Adhesive [tape], Betadine [povidone iodine], Ciprofloxacin, Codeine , and Ranolazine    Family History:  The patient's family history includes Brain cancer in her brother; Cancer in her mother and sister; Diabetes in her brother; Heart attack in her brother; Heart disease in her brother, brother, brother, father, sister, and sister; High blood pressure in her brother, brother, and sister.   ROS:   Please see the history of present illness.    ROS All other systems are reviewed and are negative.   PHYSICAL EXAM:   VS:  BP (!) 108/56 (BP Location: Left Arm, Patient Position: Sitting, Cuff Size: Large)   Pulse 67   Ht 5\' 5"   (1.651 m)   Wt 181 lb (82.1 kg)   SpO2 93%   BMI 30.12 kg/m      General: Alert, oriented x3, no distress, overweight/borderline Head: no evidence of trauma, PERRL, EOMI, no exophtalmos or lid lag, no myxedema, no xanthelasma; normal ears, nose and oropharynx Neck: normal jugular venous pulsations and no hepatojugular reflux; brisk carotid pulses without delay and no carotid bruits Chest: clear to auscultation, no signs of consolidation by percussion or palpation, normal fremitus, symmetrical and full respiratory excursions Cardiovascular: normal position and quality of the apical impulse, regular rhythm, normal first and second heart sounds, no murmurs, rubs or gallops Abdomen: no tenderness or distention, no masses by palpation, no abnormal pulsatility or arterial bruits, normal bowel sounds, no hepatosplenomegaly Extremities: no clubbing, cyanosis or edema; 2+ radial, ulnar and brachial pulses bilaterally; 2+ right femoral, posterior tibial and dorsalis pedis pulses; 2+ left femoral, posterior tibial and dorsalis pedis pulses; no subclavian or femoral bruits Neurological: grossly nonfocal Psych: Normal mood and affect    Wt Readings from Last 3 Encounters:  01/07/24 181 lb (82.1 kg)  12/06/23 184 lb (83.5 kg)  11/29/23 184 lb (83.5 kg)      Studies/Labs Reviewed:   EKG:  EKG is not ordered today. Personally reviewed the tracing from 11/20/2023 which shows normal sinus rhythm and is a normal tracing.    EKG Interpretation Date/Time:    Ventricular Rate:    PR Interval:    QRS Duration:    QT Interval:    QTC Calculation:   R Axis:      Text Interpretation:           Recent Labs: 05/14/2023: NT-Pro BNP 509 09/20/2023: TSH 1.44 11/19/2023: ALT 26; BUN 20; Creat 0.99; Hemoglobin 14.1; Magnesium  1.8; Platelets 175; Potassium 4.3; Sodium 141 11/20/2023: BNP 238.2   Lipid Panel    Component Value Date/Time   CHOL 122 09/20/2023 1118   CHOL 125 02/04/2023 1156   TRIG 59  09/20/2023 1118  HDL 63 09/20/2023 1118   HDL 64 02/04/2023 1156   CHOLHDL 1.9 09/20/2023 1118   VLDL 48 (H) 06/27/2015 1336   LDLCALC 45 09/20/2023 1118      Labs 03/23/2020 Total cholesterol 145, HDL 55, LDL 69, triglycerides 118 TSH 0.886  09/21/2020 Total cholesterol 122, HDL 56, LDL 47, triglycerides 101  09/05/2021 Cholesterol 136, HDL 69, LDL 51, triglycerides 78  13/04/6577 Cholesterol 139, HDL 71, LDL 52, triglycerides 82  46/96/2952 Cholesterol 162, HDL 52, LDL 87, triglycerides 131    CATH 04/05/2017  LM lesion, 70 %stenosed. Mid LAD lesion, 0 %stenosed at site of prior stent. Ost Cx lesion, 60 %stenosed. Prox RCA-1 lesion, 20 %stenosed. Prox RCA-2 lesion, 30 %stenosed. The left ventricular systolic function is normal. LV end diastolic pressure is normal. The left ventricular ejection fraction is 55-65% by visual estimate.   1. 70% eccentric distal left main stenosis. Angiographically similar to 2016. FFR 0.89 into the LAD and 0.86 into the LCx suggesting that this lesion is not flow limiting.  2. Otherwise nonobstructive CAD. Patent stent in LAD. 3. Normal LV function 4. Normal LVEDP   Plan: recommend continued medical therapy.    ASSESSMENT:    1. Chronic diastolic heart failure (HCC)   2. Coronary artery disease of native artery of native heart with stable angina pectoris (HCC)   3. Essential hypertension   4. Hyperlipidemia LDL goal <70   5. Overweight (BMI 25.0-29.9)   6. CKD (chronic kidney disease) stage 2, GFR 60-89 ml/min         PLAN:  In order of problems listed above:  CHF: Clinically she appears to be euvolemic.  NYHA functional class II.  But still maintain upper limit to her "dry weight" of around 180 pounds on her home scale (our office scale overestimates this by about 5 pounds).    Previous attempts at aggressive diuresis using metolazone  has led to acute kidney injury on a couple of occasions.  We have had to avoid SGLT2  inhibitors due to cost.  Baseline BNP seems to be 100-124, has been as high as 230 in the past and was around 230 a month ago. CAD: Has not had angina pectoris in several years.  Will stop the long-acting nitrates and continue the amlodipine  2.5 mg as her only antianginal (she is intolerant to beta-blockers due to bradycardia and ranolazine  caused severe constipation).  She has a remote history of stent to the LAD more than 10 years ago with a "jailed" diagonal that probably is responsible for small area of anterolateral ischemia.   She has a known moderate stenosis in the left main coronary artery that was not significant by FFR performed as recently as 2018.  Nuclear stress testing would not be useful to evaluate this due to the likelihood of balanced ischemia.  If necessary, would follow this up with coronary CT angiography.  However I do not think she would be a candidate for bypass surgery anymore, with advancing age and worsening functional status.   HTN: Following substantial weight loss she no longer requires antihypertensive medications.  In fact her diastolic blood pressure is quite low and we are continuing the amlodipine  only for it antianginal benefit. Hyperlipidemia: All lipid parameters are excellent on current medications (pravastatin  plus ezetimibe ).  She did not tolerate atorvastatin.   Overweight: No longer obese, now in overweight range. CKD 2: Recent creatinine 0.99 similar to her baseline. Neurological complaints: She had some complaints that might have represented a TIA earlier  in 2023, but I wonder whether they were more related to anxiety and frustration.  No recent problems to suggest TIA or stroke.  Repeat carotid duplex ultrasound performed 11/03/2021 shows widely patent carotids and antegrade flow in the vertebral arteries bilaterally, without significant plaque . Family/social stressors: Bob's dementia has had serious impact on his own physical health and on her emotional health.   She is doing the best she can and the situation has improved since she has a full-time caregiver during the day.  She does have some home nursing visits that are helping; she does not want him to go to a nursing home.   Medication Adjustments/Labs and Tests Ordered: Current medicines are reviewed at length with the patient today.  Concerns regarding medicines are outlined above.  Medication changes, Labs and Tests ordered today are listed in the Patient Instructions below. Patient Instructions  Medication Instructions:  Stop Isosorbide  Continue all other medications *If you need a refill on your cardiac medications before your next appointment, please call your pharmacy*  Lab Work: None ordered  Testing/Procedures: None ordered  Follow-Up: At [facility], you and your health needs are our priority.  As part of our continuing mission to provide you with exceptional heart care, our providers are all part of one team.  This team includes your primary Cardiologist (physician) and Advanced Practice Providers or APPs (Physician Assistants and Nurse Practitioners) who all work together to provide you with the care you need, when you need it.  Your next appointment:  6 months   Call in July to schedule Oct appointment     Provider:  Dr.Mitsue Peery   We recommend signing up for the patient portal called "MyChart".  Sign up information is provided on this After Visit Summary.  MyChart is used to connect with patients for Virtual Visits (Telemedicine).  Patients are able to view lab/test results, encounter notes, upcoming appointments, etc.  Non-urgent messages can be sent to your provider as well.   To learn more about what you can do with MyChart, go to [email or web address].         1st Floor: - Lobby - Registration  - Pharmacy  - Lab - Cafe  2nd Floor: - PV Lab - Diagnostic Testing (echo, CT, nuclear med)  3rd Floor: - Vacant  4th Floor: - TCTS (cardiothoracic  surgery) - AFib Clinic - Structural Heart Clinic - Vascular Surgery  - Vascular Ultrasound  5th Floor: - HeartCare Cardiology (general and EP) - Clinical Pharmacy for coumadin, hypertension, lipid, weight-loss medications, and med management appointments    Valet parking services will be available as well.          Signed, Luana Rumple, MD  01/07/2024 10:16 AM    [facility] [address] [place], [place], [place]  [address] Phone: [phone]; Fax: [phone]

## 2024-01-07 NOTE — Patient Instructions (Signed)
 Medication Instructions:  Stop Isosorbide  Continue all other medications *If you need a refill on your cardiac medications before your next appointment, please call your pharmacy*  Lab Work: None ordered  Testing/Procedures: None ordered  Follow-Up: At [facility], you and your health needs are our priority.  As part of our continuing mission to provide you with exceptional heart care, our providers are all part of one team.  This team includes your primary Cardiologist (physician) and Advanced Practice Providers or APPs (Physician Assistants and Nurse Practitioners) who all work together to provide you with the care you need, when you need it.  Your next appointment:  6 months   Call in July to schedule Oct appointment     Provider:  Dr.Croitoru   We recommend signing up for the patient portal called "MyChart".  Sign up information is provided on this After Visit Summary.  MyChart is used to connect with patients for Virtual Visits (Telemedicine).  Patients are able to view lab/test results, encounter notes, upcoming appointments, etc.  Non-urgent messages can be sent to your provider as well.   To learn more about what you can do with MyChart, go to [email or web address].         1st Floor: - Lobby - Registration  - Pharmacy  - Lab - Cafe  2nd Floor: - PV Lab - Diagnostic Testing (echo, CT, nuclear med)  3rd Floor: - Vacant  4th Floor: - TCTS (cardiothoracic surgery) - AFib Clinic - Structural Heart Clinic - Vascular Surgery  - Vascular Ultrasound  5th Floor: - HeartCare Cardiology (general and EP) - Clinical Pharmacy for coumadin, hypertension, lipid, weight-loss medications, and med management appointments    Valet parking services will be available as well.

## 2024-01-10 ENCOUNTER — Other Ambulatory Visit: Payer: Self-pay | Admitting: Adult Health

## 2024-01-10 ENCOUNTER — Other Ambulatory Visit: Payer: Self-pay | Admitting: Cardiovascular Disease

## 2024-01-10 DIAGNOSIS — H409 Unspecified glaucoma: Secondary | ICD-10-CM

## 2024-01-10 NOTE — Telephone Encounter (Signed)
 Pt's pharmacy is requesting a refill on medication levothyroxine . Would Dr. Alvis Ba like to refill this non cardiac medication? Please address

## 2024-01-10 NOTE — Telephone Encounter (Signed)
*  STAT* If patient is at the pharmacy, call can be transferred to refill team.   1. Which medications need to be refilled? (please list name of each medication and dose if known)   levothyroxine  (SYNTHROID ) 75 MCG tablet   2. Would you like to learn more about the convenience, safety, & potential cost savings by using the [facility]?   3. Are you open to using the [facility] (Type [facility]. ).  4. Which pharmacy/location (including street and city if local pharmacy) is medication to be sent to?  [id number] - [place], [place] - [address]   5. Do they need a 30 day or 90 day supply?   90 day  Patient stated she is completely out of this medication.

## 2024-01-13 ENCOUNTER — Other Ambulatory Visit (HOSPITAL_COMMUNITY): Payer: Self-pay

## 2024-01-13 MED ORDER — LEVOTHYROXINE SODIUM 75 MCG PO TABS
75.0000 ug | ORAL_TABLET | Freq: Every day | ORAL | 1 refills | Status: DC
  Filled 2024-01-13: qty 90, 90d supply, fill #0

## 2024-01-13 NOTE — Telephone Encounter (Signed)
 Pt is requesting a refill on medication levothyroxine . Would Dr. Alvis Ba like to refill this non cardiac medication? Please address

## 2024-01-13 NOTE — Telephone Encounter (Signed)
 Please refill for 90 days and ask her to get future refills from PCP.

## 2024-01-15 ENCOUNTER — Other Ambulatory Visit (HOSPITAL_COMMUNITY): Payer: Self-pay

## 2024-01-15 ENCOUNTER — Other Ambulatory Visit: Payer: Self-pay

## 2024-01-15 MED ORDER — LEVOTHYROXINE SODIUM 75 MCG PO TABS
75.0000 ug | ORAL_TABLET | Freq: Every day | ORAL | 1 refills | Status: DC
  Filled 2024-02-21 – 2024-04-15 (×3): qty 90, 90d supply, fill #0

## 2024-01-15 NOTE — Telephone Encounter (Signed)
 Pt's medication was resent to pt's preferred pharmacy. Confirmation received.

## 2024-01-21 ENCOUNTER — Ambulatory Visit: Payer: Self-pay

## 2024-01-21 DIAGNOSIS — H0102B Squamous blepharitis left eye, upper and lower eyelids: Secondary | ICD-10-CM | POA: Diagnosis not present

## 2024-01-21 DIAGNOSIS — H401131 Primary open-angle glaucoma, bilateral, mild stage: Secondary | ICD-10-CM | POA: Diagnosis not present

## 2024-01-21 DIAGNOSIS — H0102A Squamous blepharitis right eye, upper and lower eyelids: Secondary | ICD-10-CM | POA: Diagnosis not present

## 2024-01-21 DIAGNOSIS — Z961 Presence of intraocular lens: Secondary | ICD-10-CM | POA: Diagnosis not present

## 2024-01-21 NOTE — Telephone Encounter (Signed)
  Chief Complaint: Vaginal bleeding Symptoms: see above Frequency: 1 time Pertinent Negatives: Patient denies new weakness Disposition: [] ED /[] Urgent Care (no appt availability in office) / [] Appointment(In office/virtual)/ []  [facility]/ [x] Home Care/ [] Refused Recommended Disposition /[] [facility]/ [x]  Follow-up with PCP Additional Notes: Patient called in stating she noticed blood on her pad today when she went to the restroom. Patient is recovering from a yeast infection. Patient noticed some redness as well in the vaginal area. Patient advised to continue monitoring and if this occurs through the night and into tomorrow to contact the nurse triage line for further evaluation.    Copied from CRM [id number]. Topic: Clinical - Red Word Triage >> Jan 21, 2024  4:25 PM Julie Oddi wrote: Red Word that prompted transfer to Nurse Triage: Blood in diaper/vagina Additional Information  Commented on: All Negative - Home Care    1st occurrence, recovering from  yeast infection  Answer Assessment - Initial Assessment Questions 1. AMOUNT: "Describe the bleeding that you are having."    - SPOTTING: spotting, or pinkish / brownish mucous discharge; does not fill panty liner or pad    - MILD:  less than 1 pad / hour; less than patient's usual menstrual bleeding   - MODERATE: 1-2 pads / hour; 1 menstrual cup every 6 hours; small-medium blood clots (e.g., pea, grape, small coin)   - SEVERE: soaking 2 or more pads/hour for 2 or more hours; 1 menstrual cup every 2 hours; bleeding not contained by pads or continuous red blood from vagina; large blood clots (e.g., golf ball, large coin)      1 occurrence  2. ONSET: "When did the bleeding begin?" "Is it continuing now?"     Today 5. ABDOMEN PAIN: "Do you have any pain?" "How bad is the pain?"  (e.g., Scale 1-10; mild, moderate, or severe)   - MILD (1-3): doesn't interfere with normal activities, abdomen soft and not tender to touch    -  MODERATE (4-7): interferes with normal activities or awakens from sleep, abdomen tender to touch    - SEVERE (8-10): excruciating pain, doubled over, unable to do any normal activities      Pelvic area - moderate 8. HORMONE MEDICINES: "Are you taking any hormone medicines, prescription or over-the-counter?" (e.g., birth control pills, estrogen)     No 9. BLOOD THINNER MEDICINES: "Do you take any blood thinners?" (e.g., Coumadin / warfarin, Pradaxa / dabigatran, aspirin )     No 10. CAUSE: "What do you think is causing the bleeding?" (e.g., recent gyn surgery, recent gyn procedure; known bleeding disorder, cervical cancer, polycystic ovarian disease, fibroids)         No 11. HEMODYNAMIC STATUS: "Are you weak or feeling lightheaded?" If Yes, ask: "Can you stand and walk normally?"        No newness 12. OTHER SYMPTOMS: "What other symptoms are you having with the bleeding?" (e.g., passed tissue, vaginal discharge, fever, menstrual-type cramps)       Yeast infection  Protocols used: Vaginal Bleeding - Abnormal-A-AH

## 2024-01-21 NOTE — Telephone Encounter (Signed)
 In agreement with plan

## 2024-01-21 NOTE — Telephone Encounter (Signed)
 Message routed to Camilo Cella, NP due to PCP [name], Elijio Guadeloupe, NP being out of office.

## 2024-01-22 ENCOUNTER — Telehealth: Payer: Self-pay | Admitting: Cardiovascular Disease

## 2024-01-22 NOTE — Telephone Encounter (Signed)
 Pt c/o swelling/edema: STAT if pt has developed SOB within 24 hours  If swelling, where is the swelling located?   Feet and legs  How much weight have you gained and in what time span?   Yes  Have you gained 2 pounds in a day or 5 pounds in a week?   More than 5 pounds in a week - today 188 pounds, last Friday - 182-183  Do you have a log of your daily weights (if so, list)?   Yes  Are you currently taking a fluid pill?   Yes  Are you currently SOB?   When up and moving a lot  Have you traveled recently in a car or plane for an extended period of time?   No  Patient called to report she has developed a hacking cough in addition to the weight gain.

## 2024-01-22 NOTE — Telephone Encounter (Signed)
 Patient identification verified by 2 forms. Sims Duck, RN     Called and spoke to patient  Patient states:  - Started having hacking cough this morning. Woke up this morning with the cough.  - Last weighed 182 fridyay 5/2. Now 188 today.  - Taking torsemide  20 mg daily and potassium chloride  20 meq bid as prescribed.  - SOB present with and without activity.   - Cough is dry. No mucus. Nose is running as well.    Patient denies:  - dizziness, chest pain, n/v, vision changes, muscle weakness,              Interventions/Plan: - Patient instructed to increase torsemide  to 40 mg daily and continue 20 mg BID of potassium chloride . She will also check daily weights.  - F/U appt scheduled with DOD Dr. Carolynne Citron next week.   Reviewed ED warning signs/precautions  Patient agrees with plan, no questions at this time

## 2024-01-28 ENCOUNTER — Ambulatory Visit: Attending: Internal Medicine | Admitting: Internal Medicine

## 2024-01-28 ENCOUNTER — Encounter: Payer: Self-pay | Admitting: Internal Medicine

## 2024-01-28 VITALS — BP 124/60 | HR 64 | Ht 65.5 in | Wt 186.0 lb

## 2024-01-28 DIAGNOSIS — I5033 Acute on chronic diastolic (congestive) heart failure: Secondary | ICD-10-CM

## 2024-01-28 MED ORDER — POTASSIUM CHLORIDE CRYS ER 20 MEQ PO TBCR: 20.0000 meq | EXTENDED_RELEASE_TABLET | Freq: Two times a day (BID) | ORAL | Status: DC

## 2024-01-28 MED ORDER — TORSEMIDE 20 MG PO TABS: 20.0000 mg | ORAL_TABLET | Freq: Every day | ORAL | 11 refills | Status: DC

## 2024-01-28 NOTE — Addendum Note (Signed)
 Addended by: Cobe Viney N on: 01/28/2024 09:11 AM   Modules accepted: Orders

## 2024-01-28 NOTE — Patient Instructions (Addendum)
 Medication Instructions:  Your physician has recommended you make the following change in your medication:  Take torsemide  20mg  1 tablet daily, except on Tuesday and Friday you will take 2 tablets (40mg  total). On Tuesday and Friday please also take 1 tablet additional Klor-con .  Lab Work: None ordered.  You may go to any [facility] Location for your lab work:  [facility] - [address] ([facility]) - [address] [phone] [address]  [place] - [address]   [place]  - [address]   [place] - [address] - [address] [employer]  - [address] [place] - [address] ([facility]   If you have labs (blood work) drawn today and your tests are completely normal, you will receive your results only by: [employer] (if you have MyChart)  If you have any lab test that is abnormal or we need to change your treatment, we will call you or send a MyChart message to review the results.  Testing/Procedures: None ordered.  Follow-Up: At [facility], you and your health needs are our priority.  As part of our continuing mission to provide you with exceptional heart care, we have created designated Provider Care Teams.  These Care Teams include your primary Cardiologist (physician) and Advanced Practice Providers (APPs -  Physician Assistants and Nurse Practitioners) who all work together to provide you with the care you need, when you need it.  Your next appointment:   2/3 weeks   The format for your next appointment:   In Person  Provider:   Dr Alvis Ba

## 2024-01-28 NOTE — Progress Notes (Signed)
 HPI Jill Preston returns today for followup. She is a pleasant 89 yo woman with a h/o CAD and HFPEF, and chronic renal insufficiency. She had been out of her torsemide , and gained weight and then took 2 torsemide  on 5/7, lost 10 lbs and since has only taken one tab daily and her weight has gradually gone back up. She has dyspnea with any activity.  Allergies  Allergen Reactions   Atorvastatin Anaphylaxis and Other (See Comments)    Patient does not remember; caused pneumonia- took her off of it per patient   Penicillins Anaphylaxis, Hives, Swelling and Other (See Comments)    Tongue swelling Did it involve swelling of the face/tongue/throat, SOB, or low BP? Yes Did it involve sudden or severe rash/hives, skin peeling, or any reaction on the inside of your mouth or nose? Yes Did you need to seek medical attention at a hospital or doctor's office? Yes When did it last happen?  39 or 89 years old    If all above answers are "NO", may proceed with cephalosporin use.    Shellfish Allergy Anaphylaxis, Nausea And Vomiting and Other (See Comments)    Severe nausea and vomiting   Aminophylline  Itching, Swelling, Rash and Other (See Comments)    Pt experienced burning urination, itching, redness/rash, and swelling of genitals after given this medication via IV push.   Iodinated Contrast Media Swelling and Other (See Comments)    FLUSHING, also; LLE became swollen   Latex Other (See Comments)    Causes blisters   Oxybutynin Chloride Other (See Comments)    "blisters"   Propoxyphene Other (See Comments)    Unknown reaction   Vancomycin  Hives and Other (See Comments)    06/02/2018- Hives arm, back and chest   Adhesive [Tape] Rash and Other (See Comments)    Paper tape is ok   Betadine [Povidone Iodine] Rash   Ciprofloxacin Hives   Codeine  Nausea And Vomiting    .   Ranolazine  Other (See Comments)    Constipation  .     Current Outpatient Medications  Medication Sig Dispense  Refill   amLODipine  (NORVASC ) 2.5 MG tablet Take 2.5 mg by mouth daily.     cyanocobalamin  (VITAMIN B12) 1000 MCG tablet Take 1 tablet (1,000 mcg total) by mouth daily.     ezetimibe  (ZETIA ) 10 MG tablet Take 1 tablet (10 mg total) by mouth every morning. 90 tablet 3   gabapentin  (NEURONTIN ) 300 MG capsule Take 1 capsule (300 mg total) by mouth at bedtime. 90 capsule 1   latanoprost  (XALATAN ) 0.005 % ophthalmic solution INSTILL 1 DROP IN BOTH EYES AT BEDTIME 2.5 mL 2   levothyroxine  (SYNTHROID ) 75 MCG tablet Take 1 tablet (75 mcg total) by mouth daily before breakfast. 90 tablet 1   meclizine  (ANTIVERT ) 12.5 MG tablet Take 1 tablet (12.5 mg total) by mouth 3 (three) times daily for 5 days, THEN 1 tablet (12.5 mg total) 3 (three) times daily as needed. 30 tablet 0   Nutritional Supplements (ENSURE HIGH PROTEIN) LIQD Take 1 Can by mouth daily. (Patient taking differently: Take 2 Cans by mouth 2 (two) times daily.)     OVER THE COUNTER MEDICATION Take 1 capsule by mouth daily. OmegaXL joint and muscle support     potassium chloride  SA (KLOR-CON  M) 20 MEQ tablet Take 1 tablet (20 mEq total) by mouth 2 (two) times daily.     pravastatin  (PRAVACHOL ) 80 MG tablet Take 1 tablet (80 mg total)  by mouth at bedtime. 90 tablet 1   sertraline  (ZOLOFT ) 25 MG tablet Take 1 tablet (25 mg total) by mouth daily. 90 tablet 3   torsemide  (DEMADEX ) 20 MG tablet Take 1 tablet (20 mg total) by mouth daily. 30 tablet 11   No current facility-administered medications for this visit.     Past Medical History:  Diagnosis Date   Arthritis    right knee   Cancer (HCC) yrs ago   skin cancer removed from face   Chronic diastolic CHF (congestive heart failure) (HCC)    Chronic venous insufficiency    LEA VENOUS, 10/17/2011 - mild reflux in bilateral common femoral veins   CKD (chronic kidney disease), stage III (HCC)    Coronary artery disease    a. s/p PCI/BMS to prox LAD and balloon angioplasty to mLAD with suboptimal  result in 2000. b. Abnl nuc 2012, cath 09/2011 - showed that the overall territory of potential ischemia was small and attributable to a moderately diseased small second diagonal artery. Med rx.   Dyspnea    with activity   Headache    History of blood transfusion 2017   Hyperlipidemia    Hypertension    Hypertensive heart disease    Hypothyroidism    Obesity    Pneumonia    several times   Stroke [facility])    pt. states she had "light stroke" in sept. 1980   Urinary incontinence     ROS:   All systems reviewed and negative except as noted in the HPI.   Past Surgical History:  Procedure Laterality Date   ABDOMINAL HYSTERECTOMY  1970's   complete   ANKLE ARTHROSCOPY Right 07/10/2017   Procedure: ANKLE ARTHROSCOPY;  Surgeon: Camilo Cella, DPM;  Location: [facility] OR;  Service: Podiatry;  Laterality: Right;   BACK SURGERY  07/26/2016   cervical neck surgery, [facility]   BALLOON DILATION N/A 12/01/2021   Procedure: BALLOON DILATION;  Surgeon: Alvis Jourdain, MD;  Location: [facility] ENDOSCOPY;  Service: Gastroenterology;  Laterality: N/A;   BLADDER SURGERY  2010   WITH MESH  bladder tach   bunion removal surgery Bilateral 15 yrs ago   CARDIAC CATHETERIZATION Left 09/25/2011   Medical management   CARDIAC CATHETERIZATION Left 03/25/2001   Normal LV function, LAD residual narrowing of less than 10%, normal ramus intermediate, circumflex, and RCA,    CARDIAC CATHETERIZATION  09/04/1999   LAD, 3x33mm Tetra stent resulting in a reduction of the 80% stenosis to 0% residual   CARDIAC CATHETERIZATION N/A 01/26/2015   Procedure: Right/Left Heart Cath and Coronary Angiography;  Surgeon: Millicent Ally, MD;  Location: [facility] INVASIVE CV LAB;  Service: Cardiovascular;  Laterality: N/A;   CARDIAC CATHETERIZATION N/A 01/27/2015   Procedure: Intravascular Pressure Wire/FFR Study;  Surgeon: Odie Benne, MD;  Location: [facility] INVASIVE CV LAB;  Service: Cardiovascular;  Laterality: N/A;   CARDIAC  CATHETERIZATION N/A 01/27/2015   Procedure: Right Heart Cath;  Surgeon: Odie Benne, MD;  Location: [facility] INVASIVE CV LAB;  Service: Cardiovascular;  Laterality: N/A;   CHOLECYSTECTOMY     COLONOSCOPY WITH PROPOFOL  N/A 03/07/2017   Procedure: COLONOSCOPY WITH PROPOFOL ;  Surgeon: Tami Falcon, MD;  Location: [facility];  Service: Endoscopy;  Laterality: N/A;   CORONARY PRESSURE/FFR STUDY N/A 04/05/2017   Procedure: Intravascular Pressure Wire/FFR Study;  Surgeon: Swaziland, Peter M, MD;  Location: [facility] INVASIVE CV LAB;  Service: Cardiovascular;  Laterality: N/A;   CORONARY STENT PLACEMENT     LAD  x 1   ESOPHAGOGASTRODUODENOSCOPY (EGD) WITH PROPOFOL  N/A 12/01/2021   Procedure: ESOPHAGOGASTRODUODENOSCOPY (EGD) WITH PROPOFOL ;  Surgeon: Alvis Jourdain, MD;  Location: [facility];  Service: Gastroenterology;  Laterality: N/A;   ganglion cyst removal  yrs ago   x 2   ILIAC VEIN ANGIOPLASTY / STENTING  02/15/2015   IVC FILTER INSERTION  2016   IVC FILTER REMOVAL  02/15/2015   at [facility]   LEFT HEART CATH AND CORONARY ANGIOGRAPHY N/A 04/05/2017   Procedure: Left Heart Cath and Coronary Angiography;  Surgeon: Swaziland, Peter M, MD;  Location: [facility] INVASIVE CV LAB;  Service: Cardiovascular;  Laterality: N/A;   LEFT HEART CATHETERIZATION WITH CORONARY ANGIOGRAM N/A 09/25/2011   Procedure: LEFT HEART CATHETERIZATION WITH CORONARY ANGIOGRAM;  Surgeon: Luana Rumple, MD;  Location: [facility] CATH LAB;  Service: Cardiovascular;  Laterality: N/A;   multiple bladder surgeries to remove mesh     ROTATOR CUFF REPAIR Right    stent to groin Left 08/2014   left leg   TENDON REPAIR Right 07/10/2017   Procedure: RIGHT PERONEAL TENDON REPAIR;  Surgeon: Camilo Cella, DPM;  Location: [facility] OR;  Service: Podiatry;  Laterality: Right;   TENDON REPAIR Right 05/27/2019   Procedure: PERONEAL TENDON REPAIR x2;  Surgeon: Camilo Cella, DPM;  Location: [facility] ORS;  Service: Podiatry;  Laterality: Right;     Family History  Problem  Relation Age of Onset   Cancer Mother    Heart disease Father    Heart disease Sister    High blood pressure Sister    Heart disease Sister    Cancer Sister    Heart disease Brother    Diabetes Brother    Brain cancer Brother    High blood pressure Brother    Heart disease Brother    Heart attack Brother    Heart disease Brother    High blood pressure Brother      Social History   Socioeconomic History   Marital status: Married    Spouse name: Not on file   Number of children: Not on file   Years of education: Not on file   Highest education level: Not on file  Occupational History   Not on file  Tobacco Use   Smoking status: Never   Smokeless tobacco: Never  Vaping Use   Vaping status: Never Used  Substance and Sexual Activity   Alcohol use: No   Drug use: No   Sexual activity: Not on file  Other Topics Concern   Not on file  Social History Narrative   Per [facility] New Patient Packet Abstracted on 03/06/2021      Diet: Left blank       Caffeine: No      Married, if yes what year: Yes, 1955      Do you live in a house, apartment, assisted living, condo, trailer, ect: House      Is it one or more stories: 1 story      How many persons live in your home? 2      Pets: No      Highest level or education completed: 12 th grade      Current/Past profession: Research officer, political party       Exercise:          Yes       Type and how often: Walking          Living Will: Yes   DNR: No   POA/HPOA: Yes  Functional Status:   Do you have difficulty bathing or dressing yourself? Left Blank    Do you have difficulty preparing food or eating?Left Blank   Do you have difficulty managing your medications?Left Blank   Do you have difficulty managing your finances?Left Blank   Do you have difficulty affording your medications?Left Blank   Social Drivers of Health   Financial Resource Strain: Not on file  Food Insecurity: No Food Insecurity (11/18/2023)   Hunger Vital  Sign    Worried About Running Out of Food in the Last Year: Never true    Ran Out of Food in the Last Year: Never true  Recent Concern: Food Insecurity - Food Insecurity Present (11/13/2023)   Hunger Vital Sign    Worried About Running Out of Food in the Last Year: Sometimes true    Ran Out of Food in the Last Year: Sometimes true  Transportation Needs: No Transportation Needs (11/18/2023)   PRAPARE - Administrator, Civil Service (Medical): No    Lack of Transportation (Non-Medical): No  Physical Activity: Not on file  Stress: Not on file  Social Connections: Unknown (11/13/2023)   Social Connection and Isolation Panel [NHANES]    Frequency of Communication with Friends and Family: More than three times a week    Frequency of Social Gatherings with Friends and Family: Three times a week    Attends Religious Services: Patient declined    Active Member of Clubs or Organizations: No    Attends Banker Meetings: Never    Marital Status: Married  Catering manager Violence: Not At Risk (11/18/2023)   Humiliation, Afraid, Rape, and Kick questionnaire    Fear of Current or Ex-Partner: No    Emotionally Abused: No    Physically Abused: No    Sexually Abused: No     BP 124/60   Pulse 64   Ht 5' 5.5" (1.664 m)   Wt 186 lb (84.4 kg)   SpO2 95%   BMI 30.48 kg/m   Physical Exam:  Well appearing NAD HEENT: Unremarkable Neck:  No JVD, no thyromegally Lymphatics:  No adenopathy Back:  No CVA tenderness Lungs:  basilar rales, no wheezes HEART:  Regular rate rhythm, no murmurs, no rubs, no clicks Abd:  soft, positive bowel sounds, no organomegally, no rebound, no guarding Ext:  2 plus pulses, no edema, no cyanosis, no clubbing Skin:  No rashes no nodules Neuro:  CN II through XII intact, motor grossly intact  Assess/Plan: Acute on chronic diastolic heart failure - I have recommended she take 40 mg of torsemide  on Tues/Friday and 20 mg daily the other days. She  will increase her potassium each of those days as well.  CAD - she denies anginal symptoms.   Jill Brand Solomon Skowronek,MD

## 2024-01-29 ENCOUNTER — Other Ambulatory Visit: Payer: Self-pay

## 2024-01-29 ENCOUNTER — Telehealth: Payer: Self-pay | Admitting: Internal Medicine

## 2024-01-29 MED ORDER — POTASSIUM CHLORIDE CRYS ER 20 MEQ PO TBCR: 20.0000 meq | EXTENDED_RELEASE_TABLET | Freq: Two times a day (BID) | ORAL | 3 refills | Status: DC

## 2024-01-29 MED ORDER — TORSEMIDE 20 MG PO TABS: 20.0000 mg | ORAL_TABLET | Freq: Every day | ORAL | 3 refills | Status: DC

## 2024-01-29 NOTE — Telephone Encounter (Signed)
*  STAT* If patient is at the pharmacy, call can be transferred to refill team.   1. Which medications need to be refilled? (please list name of each medication and dose if known) [id number]   2. Would you like to learn more about the convenience, safety, & potential cost savings by using the [facility]?    3. Are you open to using the [facility] (Type [facility]. ).   4. Which pharmacy/location (including street and city if local pharmacy) is medication to be sent to? [id number] - [place], [place] - [address]    5. Do they need a 30 day or 90 day supply? 90 day

## 2024-01-29 NOTE — Telephone Encounter (Signed)
 Nou with [facility] is following up. She says she received a call from patient this morning, who was frantic because she's out of Torsemide . Nou called son and he informed her that he picked up a 30 day supply yesterday. New Rx for a 90 day supply may need to be cancelled. Son is POA and handling  patient's care going forward. Nou suggests contacting son to clarify dose + instructions. She would like a call back to clarify also. Please advise.

## 2024-01-29 NOTE — Telephone Encounter (Signed)
 RX sent in on 01/28/24. Verified by pharmacy. Picked up on 01/28/24 as well. Called pt and pt is adament that she doesn't have enough to last through the week.

## 2024-01-29 NOTE — Telephone Encounter (Signed)
 Called Pt and could not leave message.  Wrong amount of medication was sent to pharmacy. Correct amount has been sent of both torsemide  and potassium chloride .

## 2024-01-30 NOTE — Telephone Encounter (Signed)
 Spoke with Nou. Nou stated the patient is losing her memory and her son is taking over care. Confirmed [facility] phone number. Will make sure he is contacted moving forward.

## 2024-02-04 ENCOUNTER — Telehealth: Payer: Self-pay | Admitting: Internal Medicine

## 2024-02-04 NOTE — Telephone Encounter (Signed)
 Spoke with Jill Preston who confirms weight gain of 7 pounds over 3 day period.  Complaining of swelling mostly in feet and legs with mild shortness of breath and hacky, non-productive cough.  Jill Preston states she saw Dr Carolynne Citron last week who had her to increase Torsemide  20mg  - daily with 2 tablets (40mg ) on Tuesday's and Friday's.  Jill Preston reports she has not had an increase in urination with extra Torsemide .  Current BP 155/87 with HR - 67.  Jill Preston following a low sodium diet. Jill Preston advised will forward to Dr Alvis Ba for review and further recommendation.  Jill Preston verbalizes understanding and agrees with current plan.

## 2024-02-04 NOTE — Telephone Encounter (Signed)
 Pt c/o swelling: STAT is pt has developed SOB within 24 hours  How much weight have you gained and in what time span?  7 lbs in 3 days  If swelling, where is the swelling located?  Legs, feet   Are you currently taking a fluid pill?  Yes   Are you currently SOB?  No   Do you have a log of your daily weights (if so, list)?  5/20: 190  5/19: 187  5/18: 185   5/17: 183    Have you gained 3 pounds in a day or 5 pounds in a week?  3 lbs since yesterday + 7 lbs since Saturday.  Have you traveled recently?  No

## 2024-02-04 NOTE — Telephone Encounter (Signed)
 Let's do torsemide  60 mg daily for the next 2 days. Let us  know how that worked on Friday afternoon, please

## 2024-02-05 NOTE — Telephone Encounter (Signed)
 Spoke with Jill Preston and advised of recommendation per Dr Alvis Ba as below.  Jill Preston verbalizes understanding and agrees with current plan.  Jill Preston thanked Charity fundraiser for the call.

## 2024-02-07 ENCOUNTER — Other Ambulatory Visit (HOSPITAL_COMMUNITY): Payer: Self-pay

## 2024-02-07 MED ORDER — TORSEMIDE 20 MG PO TABS
20.0000 mg | ORAL_TABLET | Freq: Every day | ORAL | 3 refills | Status: DC
  Filled 2024-02-07: qty 120, 40d supply, fill #0

## 2024-02-07 NOTE — Telephone Encounter (Signed)
 Spoke with patient and she states the fluid has come off. She is feeling better no SOB. She would like to know if you wanted her to continue with the increase in her torsemide . Did inforn patient provider recommendation was only for 2 days. She verbalized understanding. Will forward to provider.

## 2024-02-07 NOTE — Telephone Encounter (Signed)
 Patient called she stated as requested by Dr. Alvis Ba to report on the status of her medication change.  Patient stated she did not lose any fluid in her leg until last night.  Patient stated she weighed 190 pounds on Tuesday when she started; 189 pounds on Wednesday and Thursday and today 184 pounds.  Patient wants to know if she should stay on the 2 extra fluid pills and extra potassium and should she take any medication at night time.

## 2024-02-07 NOTE — Addendum Note (Signed)
 Addended by: Juda Norse on: 02/07/2024 12:44 PM   Modules accepted: Orders

## 2024-02-07 NOTE — Telephone Encounter (Signed)
 Patient is aware of provider recommendations she verbalized understanding.  Med list updated

## 2024-02-07 NOTE — Telephone Encounter (Signed)
 I would continue weighing every day. If her weight is 185 lb or less, she should take the usual torsemide  dose of 20 mg once daily.  If her weight is over 185 lb, take the 60 mg dose once daily. Pleaase change the prescription on her medicine llist to reflect that.

## 2024-02-11 ENCOUNTER — Telehealth: Payer: Self-pay | Admitting: Cardiovascular Disease

## 2024-02-11 MED ORDER — TORSEMIDE 20 MG PO TABS
20.0000 mg | ORAL_TABLET | Freq: Every day | ORAL | 3 refills | Status: DC
Start: 2024-02-11 — End: 2024-02-18

## 2024-02-11 NOTE — Telephone Encounter (Signed)
 Called pt and pt stated that she was requesting a refill on medication torsemide . I sent pt's medication to pt's preferred pharmacy as requested. Confirmation received.

## 2024-02-11 NOTE — Telephone Encounter (Signed)
*  STAT* If patient is at the pharmacy, call can be transferred to refill team.   1. Which medications need to be refilled? (please list name of each medication and dose if known)  Furosemide  20 MG  2. Which pharmacy/location (including street and city if local pharmacy) is medication to be sent to?  [id number] - [place], [place] - [address]  3. Do they need a 30 day or 90 day supply?  90 day supply

## 2024-02-13 ENCOUNTER — Ambulatory Visit: Payer: Self-pay

## 2024-02-13 NOTE — Telephone Encounter (Signed)
 Copied From CRM [id number]. Reason for Triage: 2 months vaginal area has been sore , and there is a rash and when blotted with a cloth the area there is blood , when check with a mirror there are 2 little sores that are red and raw , patient call back number [phone]  Chief Complaint: Vaginal symptoms Symptoms: Pain, itching, bleeding, redness Frequency: 2 months, worsened yesterday Pertinent Negatives: Patient denies relief Disposition: [] ED /[] Urgent Care (no appt availability in office) / [x] Appointment(In office/virtual)/ []  [facility]/ [] Home Care/ [] Refused Recommended Disposition /[] [facility]/ []  Follow-up with PCP Additional Notes: Patient called in to report vaginal symptoms. Patient stated she has been dealing with pain and a yeast infection for about 2 months, but noticed bleeding yesterday. Patient stated the bleeding is very mild and she is able to blot it up with a tissue. Advised patient to be seen within 24 hours, per protocol. Scheduled patient in office with PCP tomorrow morning. Provided care advice and instructed patient to call back if symptoms worsen. Patient complied.   Reason for Disposition  MODERATE-SEVERE itching (i.e., interferes with school, work, or sleep)  Answer Assessment - Initial Assessment Questions 1. SYMPTOM: "What's the main symptom you're concerned about?" (e.g., pain, itching, dryness)     Vaginal pain and vaginal bleeding 2. LOCATION: "Where is the symptoms located?" (e.g., inside/outside, left/right)     Inside and outside of vagina 3. ONSET: "When did the vaginal symptoms start?"     At least 2 months, bleeding started yesterday  4. PAIN: "Is there any pain?" If Yes, ask: "How bad is it?" (Scale: 1-10; mild, moderate, severe)   -  MILD (1-3): Doesn't interfere with normal activities.    -  MODERATE (4-7): Interferes with normal activities (e.g., work or school) or awakens from sleep.     -  SEVERE (8-10): Excruciating  pain, unable to do any normal activities.     Rates pain a 5 5. ITCHING: "Is there any itching?" If Yes, ask: "How bad is it?" (Scale: 1-10; mild, moderate, severe)     Yes 6. CAUSE: "What do you think is causing the discharge?" "Have you had the same problem before? What happened then?"     Unsure- states she has been dealing with an ongoing yeast infection 7. OTHER SYMPTOMS: "Do you have any other symptoms?" (e.g., fever, itching, vaginal bleeding, pain with urination, injury to genital area, vaginal foreign body)     "Not much bleeding"/"able to blot with a tissue", lower abdominal/vaginal pain, states she can feel "a lump" when she has a bowel movement, redness, raw spots inside vagina, inside of vagina burns when she urinates  Protocols used: Vaginal Symptoms-A-AH

## 2024-02-13 NOTE — Telephone Encounter (Signed)
 Patient was scheduled an appointment with [facility] 5/30

## 2024-02-14 ENCOUNTER — Ambulatory Visit (INDEPENDENT_AMBULATORY_CARE_PROVIDER_SITE_OTHER): Admitting: Family

## 2024-02-14 ENCOUNTER — Encounter: Payer: Self-pay | Admitting: Family

## 2024-02-14 ENCOUNTER — Telehealth: Payer: Self-pay

## 2024-02-14 VITALS — BP 130/74 | HR 64 | Temp 97.8°F | Ht 65.5 in | Wt 191.6 lb

## 2024-02-14 DIAGNOSIS — R300 Dysuria: Secondary | ICD-10-CM | POA: Diagnosis not present

## 2024-02-14 DIAGNOSIS — B3731 Acute candidiasis of vulva and vagina: Secondary | ICD-10-CM | POA: Diagnosis not present

## 2024-02-14 DIAGNOSIS — B372 Candidiasis of skin and nail: Secondary | ICD-10-CM

## 2024-02-14 DIAGNOSIS — K5901 Slow transit constipation: Secondary | ICD-10-CM | POA: Diagnosis not present

## 2024-02-14 LAB — POCT URINALYSIS DIPSTICK
Bilirubin, UA: NEGATIVE
Glucose, UA: NEGATIVE
Ketones, UA: NEGATIVE
Nitrite, UA: NEGATIVE
Protein, UA: POSITIVE — AB
Spec Grav, UA: 1.01 (ref 1.010–1.025)
Urobilinogen, UA: 0.2 U/dL
pH, UA: 7 (ref 5.0–8.0)

## 2024-02-14 MED ORDER — PSYLLIUM 28.3 % PO POWD: 1.0000 | Freq: Every day | ORAL | 5 refills | Status: AC

## 2024-02-14 MED ORDER — FLUCONAZOLE 100 MG PO TABS: 100.0000 mg | ORAL_TABLET | ORAL | 0 refills | Status: AC

## 2024-02-14 MED ORDER — NYSTATIN 100000 UNIT/GM EX CREA
1.0000 | TOPICAL_CREAM | Freq: Two times a day (BID) | CUTANEOUS | 3 refills | Status: AC
  Filled 2024-02-21 – 2024-05-23 (×5): qty 30, 15d supply, fill #0
  Filled 2024-06-03 – 2024-06-04 (×2): qty 30, 15d supply, fill #1
  Filled 2024-06-18: qty 30, 15d supply, fill #2

## 2024-02-14 NOTE — Telephone Encounter (Signed)
 Copied from CRM [id number]. Topic: Referral - Request for Referral >> Feb 13, 2024 11:16 AM Tisa Forester wrote: Did the patient discuss referral with their provider in the last year? Yes (If No - schedule appointment) (If Yes - send message)  Appointment offered? No  Type of order/referral and detailed reason for visit: request for a gynecology 2 months vaginal area has been sore , and there is a rash and when  blotted with a cloth the area there is blood , when check with a mirror there are 2 little sores that are red and raw  patient call back number [phone]   Preference of office, provider, location: N/A  If referral order, have you been seen by this specialty before? No (If Yes, this issue or another issue? When? Where?  Can we respond through MyChart? No

## 2024-02-14 NOTE — Progress Notes (Signed)
 Provider: Christean Courts FNP-C  Kevis Qu, Elijio Guadeloupe, NP  Patient Care Team: Cherylyn Sundby, Elijio Guadeloupe, NP as PCP - General (Family Medicine) Croitoru, Karyl Paget, MD as PCP - Cardiology (Cardiology) Luana Rumple, MD as Consulting Physician (Cardiology) Eduardo Grade, MD as Referring Physician (Dermatology) Camilo Cella, DPM (Inactive) as Consulting Physician (Podiatry) Bur[place], [name], OD [facility])  Extended Emergency Contact Information Primary Emergency Contact: Dede,Lewis "Keneth Pay, [place] Home Phone: [phone] Mobile Phone: [phone] Relation: Son Secondary Emergency Contact: [facility] Phone: [phone] Mobile Phone: [phone] Relation: Friend  Code Status:  Full Code  Goals of care: Advanced Directive information    12/06/2023    9:47 AM  Advanced Directives  Does Patient Have a Medical Advance Directive? Yes  Type of Advance Directive Healthcare Power of Attorney  Does patient want to make changes to medical advance directive? No - Patient declined  Copy of Healthcare Power of Attorney in Chart? Yes - validated most recent copy scanned in chart (See row information)     Chief Complaint  Patient presents with   Vaginal Pain    Patient has some bleeding.    Discussed the use of AI scribe software for clinical note transcription with the patient, who gave verbal consent to proceed.  History of Present Illness   Jill Preston is an 89 year old female who presents with a persistent yeast infection affecting the vaginal and anal areas.  She initially noticed the yeast infection in the vaginal area, which has since spread to the anal region. She experiences a burning sensation during urination and bleeding that soils her pad, though she is uncertain of the bleeding's origin. Her husband has observed the affected area and noted its severity.  She has been using Desitin cream without relief and has previously  used nystatin  cream. She notes soreness around the anal area and experiences frequent urination with an odor.  Her bowel movements are small, hard balls, indicating constipation. She has not been using stool softeners or laxatives like Miralax. She has a history of intestinal issues following a hysterectomy, where her intestines began to prolapse into her vagina, which was temporarily managed by packing. Her stools are dark in color, which she attributes to her diet, including beet powder and an orange-flavored supplement added to her coffee.  She is the primary caregiver for her husband, [place], who has back issues and bed sores. She manages his condition with baby powder to keep dry and mentions needing gloves for caregiving tasks.    Past Medical History:  Diagnosis Date   Arthritis    right knee   Cancer (HCC) yrs ago   skin cancer removed from face   Chronic diastolic CHF (congestive heart failure) (HCC)    Chronic venous insufficiency    LEA VENOUS, 10/17/2011 - mild reflux in bilateral common femoral veins   CKD (chronic kidney disease), stage III (HCC)    Coronary artery disease    a. s/p PCI/BMS to prox LAD and balloon angioplasty to mLAD with suboptimal result in 2000. b. Abnl nuc 2012, cath 09/2011 - showed that the overall territory of potential ischemia was small and attributable to a moderately diseased small second diagonal artery. Med rx.   Dyspnea    with activity   Headache    History of blood transfusion 2017   Hyperlipidemia    Hypertension    Hypertensive heart disease    Hypothyroidism    Obesity  Pneumonia    several times   Stroke [facility])    pt. states she had "light stroke" in sept. 1980   Urinary incontinence    Past Surgical History:  Procedure Laterality Date   ABDOMINAL HYSTERECTOMY  1970's   complete   ANKLE ARTHROSCOPY Right 07/10/2017   Procedure: ANKLE ARTHROSCOPY;  Surgeon: Camilo Cella, DPM;  Location: [facility] OR;  Service: Podiatry;  Laterality:  Right;   BACK SURGERY  07/26/2016   cervical neck surgery, [facility]   BALLOON DILATION N/A 12/01/2021   Procedure: BALLOON DILATION;  Surgeon: Alvis Jourdain, MD;  Location: [facility] ENDOSCOPY;  Service: Gastroenterology;  Laterality: N/A;   BLADDER SURGERY  2010   WITH MESH  bladder tach   bunion removal surgery Bilateral 15 yrs ago   CARDIAC CATHETERIZATION Left 09/25/2011   Medical management   CARDIAC CATHETERIZATION Left 03/25/2001   Normal LV function, LAD residual narrowing of less than 10%, normal ramus intermediate, circumflex, and RCA,    CARDIAC CATHETERIZATION  09/04/1999   LAD, 3x77mm Tetra stent resulting in a reduction of the 80% stenosis to 0% residual   CARDIAC CATHETERIZATION N/A 01/26/2015   Procedure: Right/Left Heart Cath and Coronary Angiography;  Surgeon: Millicent Ally, MD;  Location: [facility] INVASIVE CV LAB;  Service: Cardiovascular;  Laterality: N/A;   CARDIAC CATHETERIZATION N/A 01/27/2015   Procedure: Intravascular Pressure Wire/FFR Study;  Surgeon: Odie Benne, MD;  Location: [facility] INVASIVE CV LAB;  Service: Cardiovascular;  Laterality: N/A;   CARDIAC CATHETERIZATION N/A 01/27/2015   Procedure: Right Heart Cath;  Surgeon: Odie Benne, MD;  Location: [facility] INVASIVE CV LAB;  Service: Cardiovascular;  Laterality: N/A;   CHOLECYSTECTOMY     COLONOSCOPY WITH PROPOFOL  N/A 03/07/2017   Procedure: COLONOSCOPY WITH PROPOFOL ;  Surgeon: Tami Falcon, MD;  Location: [facility];  Service: Endoscopy;  Laterality: N/A;   CORONARY PRESSURE/FFR STUDY N/A 04/05/2017   Procedure: Intravascular Pressure Wire/FFR Study;  Surgeon: Swaziland, Peter M, MD;  Location: [facility] INVASIVE CV LAB;  Service: Cardiovascular;  Laterality: N/A;   CORONARY STENT PLACEMENT     LAD x 1   ESOPHAGOGASTRODUODENOSCOPY (EGD) WITH PROPOFOL  N/A 12/01/2021   Procedure: ESOPHAGOGASTRODUODENOSCOPY (EGD) WITH PROPOFOL ;  Surgeon: Alvis Jourdain, MD;  Location: [facility];  Service: Gastroenterology;   Laterality: N/A;   ganglion cyst removal  yrs ago   x 2   ILIAC VEIN ANGIOPLASTY / STENTING  02/15/2015   IVC FILTER INSERTION  2016   IVC FILTER REMOVAL  02/15/2015   at [facility]   LEFT HEART CATH AND CORONARY ANGIOGRAPHY N/A 04/05/2017   Procedure: Left Heart Cath and Coronary Angiography;  Surgeon: Swaziland, Peter M, MD;  Location[facility] INVASIVE CV LAB;  Service: Cardiovascular;  Laterality: N/A;   LEFT HEART CATHETERIZATION WITH CORONARY ANGIOGRAM N/A 09/25/2011   Procedure: LEFT HEART CATHETERIZATION WITH CORONARY ANGIOGRAM;  Surgeon: Luana Rumple, MD;  Location: [facility] CATH LAB;  Service: Cardiovascular;  Laterality: N/A;   multiple bladder surgeries to remove mesh     ROTATOR CUFF REPAIR Right    stent to groin Left 08/2014   left leg   TENDON REPAIR Right 07/10/2017   Procedure: RIGHT PERONEAL TENDON REPAIR;  Surgeon: Camilo Cella, DPM;  Location: [facility] OR;  Service: Podiatry;  Laterality: Right;   TENDON REPAIR Right 05/27/2019   Procedure: PERONEAL TENDON REPAIR x2;  Surgeon: Camilo Cella, DPM;  Location: [facility] ORS;  Service: Podiatry;  Laterality: Right;    Allergies  Allergen Reactions  Atorvastatin Anaphylaxis and Other (See Comments)    Patient does not remember; caused pneumonia- took her off of it per patient   Penicillins Anaphylaxis, Hives, Swelling and Other (See Comments)    Tongue swelling Did it involve swelling of the face/tongue/throat, SOB, or low BP? Yes Did it involve sudden or severe rash/hives, skin peeling, or any reaction on the inside of your mouth or nose? Yes Did you need to seek medical attention at a hospital or doctor's office? Yes When did it last happen?  51 or 89 years old    If all above answers are "NO", may proceed with cephalosporin use.    Shellfish Allergy Anaphylaxis, Nausea And Vomiting and Other (See Comments)    Severe nausea and vomiting   Aminophylline  Itching, Swelling, Rash and Other (See Comments)    Pt experienced burning urination,  itching, redness/rash, and swelling of genitals after given this medication via IV push.   Iodinated Contrast Media Swelling and Other (See Comments)    FLUSHING, also; LLE became swollen   Latex Other (See Comments)    Causes blisters   Oxybutynin Chloride Other (See Comments)    "blisters"   Propoxyphene Other (See Comments)    Unknown reaction   Vancomycin  Hives and Other (See Comments)    06/02/2018- Hives arm, back and chest   Adhesive [Tape] Rash and Other (See Comments)    Paper tape is ok   Betadine [Povidone Iodine] Rash   Ciprofloxacin Hives   Codeine  Nausea And Vomiting    .   Ranolazine  Other (See Comments)    Constipation  .    Outpatient Encounter Medications as of 02/14/2024  Medication Sig   amLODipine  (NORVASC ) 2.5 MG tablet Take 2.5 mg by mouth daily.   cyanocobalamin  (VITAMIN B12) 1000 MCG tablet Take 1 tablet (1,000 mcg total) by mouth daily.   ezetimibe  (ZETIA ) 10 MG tablet Take 1 tablet (10 mg total) by mouth every morning.   fluconazole  (DIFLUCAN ) 100 MG tablet Take 1 tablet (100 mg total) by mouth once a week.   gabapentin  (NEURONTIN ) 300 MG capsule Take 1 capsule (300 mg total) by mouth at bedtime.   latanoprost  (XALATAN ) 0.005 % ophthalmic solution INSTILL 1 DROP IN BOTH EYES AT BEDTIME   levothyroxine  (SYNTHROID ) 75 MCG tablet Take 1 tablet (75 mcg total) by mouth daily before breakfast.   meclizine  (ANTIVERT ) 12.5 MG tablet Take 1 tablet (12.5 mg total) by mouth 3 (three) times daily for 5 days, THEN 1 tablet (12.5 mg total) 3 (three) times daily as needed.   Nutritional Supplements (ENSURE HIGH PROTEIN) LIQD Take 1 Can by mouth daily. (Patient taking differently: Take 2 Cans by mouth 2 (two) times daily.)   nystatin  cream (MYCOSTATIN ) Apply 1 Application topically 2 (two) times daily. Apply to affected area on abdominal folds and peri-area   OVER THE COUNTER MEDICATION Take 1 capsule by mouth daily. OmegaXL joint and muscle support   potassium chloride   SA (KLOR-CON  M) 20 MEQ tablet Take 1 tablet (20 mEq total) by mouth 2 (two) times daily. On Tuesday and Friday take 1 additional tablet in the morning.   pravastatin  (PRAVACHOL ) 80 MG tablet Take 1 tablet (80 mg total) by mouth at bedtime.   Psyllium 28.3 % POWD Take 1 Package by mouth daily.   sertraline  (ZOLOFT ) 25 MG tablet Take 1 tablet (25 mg total) by mouth daily.   torsemide  (DEMADEX ) 20 MG tablet Take 1 tablet (20 mg total) by mouth daily. If weight  is above 185 pounds take 3 tablets (60 mg total).   No facility-administered encounter medications on file as of 02/14/2024.    Review of Systems  Constitutional:  Negative for appetite change, chills, fatigue, fever and unexpected weight change.  Eyes:  Negative for pain, discharge, redness, itching and visual disturbance.  Respiratory:  Negative for cough, chest tightness, shortness of breath and wheezing.   Cardiovascular:  Negative for chest pain, palpitations and leg swelling.  Gastrointestinal:  Positive for constipation. Negative for abdominal distention, abdominal pain, blood in stool, diarrhea, nausea and vomiting.  Genitourinary:  Positive for dysuria. Negative for difficulty urinating, flank pain, frequency and urgency.  Musculoskeletal:  Positive for arthralgias and gait problem. Negative for back pain, joint swelling, myalgias, neck pain and neck stiffness.  Skin:  Positive for rash. Negative for color change, pallor and wound.    Immunization History  Administered Date(s) Administered   PFIZER(Purple Top)SARS-COV-2 Vaccination 11/11/2019, 11/13/2019, 12/08/2019   Pneumococcal Conjugate-13 06/14/2014   Pneumococcal Polysaccharide-23 10/26/2013, 03/30/2020   Tdap 12/19/2015, 11/12/2022   Pertinent  Health Maintenance Due  Topic Date Due   INFLUENZA VACCINE  04/17/2024   DEXA SCAN  Completed      08/23/2023   11:11 AM 09/20/2023   10:31 AM 11/08/2023   12:49 PM 11/19/2023   10:31 AM 12/06/2023    9:46 AM  Fall Risk   Falls in the past year? 0 0 1 1 1   Was there an injury with Fall? 1 0 1 1 0  Fall Risk Category Calculator 1 0 3 2 1   Patient at Risk for Falls Due to History of fall(s)  History of fall(s);Impaired balance/gait;Impaired mobility No Fall Risks No Fall Risks  Fall risk Follow up Falls evaluation completed  Falls evaluation completed Falls evaluation completed;Education provided;Falls prevention discussed Falls evaluation completed   Functional Status Survey:    Vitals:   02/14/24 0935  BP: 130/74  Pulse: 64  Temp: 97.8 F (36.6 C)  SpO2: 92%  Weight: 191 lb 9.6 oz (86.9 kg)  Height: 5' 5.5" (1.664 m)   Body mass index is 31.4 kg/m. Physical Exam GENERAL: Alert, cooperative, well developed, no acute distress. HEENT: Normocephalic, normal oropharynx, moist mucous membranes. CHEST: Clear to auscultation bilaterally, no wheezes, rhonchi, or crackles. CARDIOVASCULAR: Normal heart rate and rhythm, S1 and S2 normal without murmurs. ABDOMEN: Soft, non-tender, non-distended, without organomegaly, normal bowel sounds. GENITOURINARY: Left groin erythema, right groin clear. Vaginal area with white discoloration and bleeding.Chaperone Kriss Peter, CMA present throughout the exam   RECTAL: Perianal area whitish, less erythematous than groin. EXTREMITIES: No cyanosis or edema. NEUROLOGICAL: Cranial nerves grossly intact, moves all extremities without gross motor or sensory deficit.   Labs reviewed: Recent Labs    11/15/23 0738 11/16/23 1025 11/19/23 1117  NA 135 138 141  K 4.1 4.7 4.3  CL 95* 98 103  CO2 29 30 31   GLUCOSE 139* 204* 116  BUN 18 18 20   CREATININE 0.83 0.86 0.99*  CALCIUM 9.1 9.5 9.1  MG 1.7 1.7 1.8   Recent Labs    11/13/23 0651 11/14/23 0703 11/15/23 0738 11/19/23 1117  AST 60* 50* 47* 42*  ALT 28 28 28 26   ALKPHOS 59 66 66  --   BILITOT 0.9 0.6 0.6 0.9  PROT 5.0* 4.9* 5.3* 5.3*  ALBUMIN  2.2* 2.1* 2.2*  --    Recent Labs    09/20/23 1118  11/12/23 1407 11/13/23 0651 11/15/23 0738 11/16/23 0840 11/19/23 1117  WBC 5.0  5.0   < > 4.5 4.7 5.9  NEUTROABS 3,365 3.6  --   --   --  4,289  HGB 14.4 15.1*   < > 14.0 14.3 14.1  HCT 43.6 43.6   < > 41.6 42.1 42.2  MCV 102.1* 96.0   < > 100.5* 99.5 100.7*  PLT 100* 56*   < > 71* 85* 175   < > = values in this interval not displayed.   Lab Results  Component Value Date   TSH 1.44 09/20/2023   Lab Results  Component Value Date   HGBA1C 6.1 (H) 01/26/2015   Lab Results  Component Value Date   CHOL 122 09/20/2023   HDL 63 09/20/2023   LDLCALC 45 09/20/2023   TRIG 59 09/20/2023   CHOLHDL 1.9 09/20/2023    Significant Diagnostic Results in last 30 days:  No results found.  Assessment/Plan  Candidiasis of genitalia and skin  Chronic yeast infection affecting the vaginal and rectal areas, with symptoms of burning during urination, bleeding, and soreness. Examination shows redness and whitish areas extending from the vagina to the rectum. Chaperone Kriss Peter, CMA present throughout the exam  The condition has not improved with Desitin cream, necessitating both topical and oral antifungal treatment. - Prescribe nystatin  cream for topical application to the affected areas. - Prescribe an oral antifungal pill to be taken once a week for one month. - Instruct to wash the affected area thoroughly before applying the cream. - Advise to avoid scratching and to apply the cream instead. - Discuss the importance of keeping the area dry to prevent further infection.  Urinary tract infection (UTI) Possible urinary tract infection indicated by burning during urination and frequent urination. Urine specimen needed for further evaluation. - Obtain urine specimen for urinalysis and culture. - Perform urine dipstick test in the office.  Constipation Chronic constipation with bowel movements described as small, hard balls. Reports straining during defecation and dark stools, possibly  influenced by dietary intake, including beet powder. No current use of stool softeners or laxatives. - Recommend trying Metamucil or Miralax to help with bowel movements. - Advise increasing dietary fiber intake, including vegetables. - Discuss the potential effects of beet powder on stool color.   Family/ staff Communication: Reviewed plan of care with patient verbalized understanding   Labs/tests ordered:  - Urine Culture; Future - POC Urinalysis Dipstick   Next Appointment: Return if symptoms worsen or fail to improve.   Total time: 25 minutes. Greater than 50% of total time spent doing patient education regarding candidiasis,constipation UTI,health maintenance including symptom/medication management.   Estil Heman, NP

## 2024-02-14 NOTE — Telephone Encounter (Signed)
Seen at the office today 

## 2024-02-14 NOTE — Telephone Encounter (Signed)
Message routed to PCP [name], [name], NP  

## 2024-02-15 LAB — URINE CULTURE
MICRO NUMBER:: 16521361
SPECIMEN QUALITY:: ADEQUATE

## 2024-02-18 ENCOUNTER — Ambulatory Visit: Payer: Self-pay | Admitting: Family

## 2024-02-18 ENCOUNTER — Encounter: Payer: Self-pay | Admitting: Physician Assistant

## 2024-02-18 ENCOUNTER — Ambulatory Visit: Attending: Physician Assistant | Admitting: Physician Assistant

## 2024-02-18 VITALS — BP 140/60 | HR 51 | Ht 65.5 in | Wt 195.8 lb

## 2024-02-18 DIAGNOSIS — I5033 Acute on chronic diastolic (congestive) heart failure: Secondary | ICD-10-CM

## 2024-02-18 DIAGNOSIS — I2510 Atherosclerotic heart disease of native coronary artery without angina pectoris: Secondary | ICD-10-CM

## 2024-02-18 DIAGNOSIS — E785 Hyperlipidemia, unspecified: Secondary | ICD-10-CM

## 2024-02-18 DIAGNOSIS — I10 Essential (primary) hypertension: Secondary | ICD-10-CM

## 2024-02-18 MED ORDER — TORSEMIDE 20 MG PO TABS: ORAL_TABLET | ORAL | 1 refills | Status: DC

## 2024-02-18 NOTE — Progress Notes (Unsigned)
 Cardiology Office Note   Date:  02/20/2024  ID:  KONI KANNAN, DOB 1933-04-13, MRN [id number] PCP: Estil Heman, NP  [facility] Providers Cardiologist:  Luana Rumple, MD     History of Present Illness Jill Preston is a 89 y.o. female with past medical history of chronic diastolic heart failure, CKD, sick sinus syndrome, hypertension, hyperlipidemia and CAD.  Patient previously underwent left and right heart cath in May 2016 that showed 70% lesion in the left main, 60% lesion in the ostial left circumflex, 20 to 30% stenosis in proximal RCA.  Surgical consultation was recommended for possible CABG.  She underwent a coronary CTA with FFR in May 2016 that showed moderate distal left main stenosis was not flow-limiting.  Patient again underwent left heart cath in July 2018 that showed 70% distal left main lesion, angiographically similar to 2016.  FFR was 0.89 into LAD and 0.86 into the left circumflex artery suggesting the lesion was not flow-limiting.  Patient had otherwise nonobstructive CAD and a patent stent in the LAD.  Echocardiogram in August 2021 showed EF 60 to 65%, no regional wall motion abnormality, grade 2 DD.  Carotid ultrasound showed no significant carotid artery disease.  Patient was admitted in March 2025 with orthostatic hypotension and frequent fall.  She was found to have a urinary tract infection and was given IV fluid.  Due to low blood pressure, her Imdur , amlodipine  and torsemide  were held during the admission.  On discharge, meds were resumed.  It was suspected that her frequent falls are likely due to orthostatic hypotension in the setting of UTI.  Echocardiogram obtained on 11/13/2023 showed EF 55 to 60%, grade 2 DD, normal RV function, mild to moderate aortic dilatation, mild MR.  Patient was most recently seen by Dr. Alvis Ba on 01/07/2024 at which time she appears to be euvolemic.  She was seen by Dr. Carolynne Citron on 01/28/2024 and mentions with Dr. Carolynne Citron that  she has been out of her torsemide  and gained some weight back.  She had dyspnea on exertion.  Therefore she was restarted on the torsemide  with the instruction of taking 40 mg of torsemide  on Tuesday and Friday and 20 mg daily all the other days.   Patient presents today for follow-up.  Her weight is up.  She says she has not taken her diuretic for over 4 days since she ran out.  A new prescription has been sent to her pharmacy.  I instructed the patient to take 2 tablet of torsemide  today and tomorrow before going back to the previous instruction of taking 40 mg on Tuesday and Friday and 20 mg daily on all the other days.  She is aware to take extra dose of potassium on the days she take extra dose of torsemide .  I will see her back in 2 weeks for reassessment.  She is describing lower extremity edema and orthopnea.  She denies any PND.  On physical exam, she has mild crackles in bilateral bases and a trace lower extremity edema.  Patient has been instructed to call cardiology service if her weight continue to go up.  ROS:   Patient complains of lower extremity edema and orthopnea.  She denies of any PND.  Studies Reviewed      Cardiac Studies & Procedures   ______________________________________________________________________________________________ CARDIAC CATHETERIZATION  CARDIAC CATHETERIZATION 04/05/2017  Conclusion  LM lesion, 70 %stenosed.  Mid LAD lesion, 0 %stenosed at site of prior stent.  Ost Cx lesion, 60 %stenosed.  Prox RCA-1 lesion, 20 %stenosed.  Prox RCA-2 lesion, 30 %stenosed.  The left ventricular systolic function is normal.  LV end diastolic pressure is normal.  The left ventricular ejection fraction is 55-65% by visual estimate.  1. 70% eccentric distal left main stenosis. Angiographically similar to 2016. FFR 0.89 into the LAD and 0.86 into the LCx suggesting that this lesion is not flow limiting. 2. Otherwise nonobstructive CAD. Patent stent in LAD. 3.  Normal LV function 4. Normal LVEDP  Plan: recommend continued medical therapy.  Findings Coronary Findings Diagnostic  Dominance: Right  Left Main The lesion is eccentric. The lesion is calcified. Pressure wire/FFR was performed on the lesion. FFR: 0.86.  Left Anterior Descending Mid LAD lesion with no stenosis was previously treated.  Left Circumflex  First Obtuse Marginal Branch  Right Coronary Artery  Intervention  No interventions have been documented.   CARDIAC CATHETERIZATION  CARDIAC CATHETERIZATION 01/27/2015  Conclusion  Ost Cx lesion, 60% stenosed.  LM lesion, 50% stenosed.  1. Moderate distal left main artery stenosis with fractional flow reserve assessment suggesting the lesion is not flow limiting. (FFR=0.91) 2. Moderate ostial Circumflex stenosis  Recommendations: Medical management of CAD. I would not pursue revascularization strategies at this time. Her chest pressure has been present continuously for months with no relief suggesting it is not cardiac related.  Findings Coronary Findings Diagnostic  Dominance: Right  Left Main discrete .   Pressure wire/FFR was performed on the lesion.  FFR: 0.91.  Left Anterior Descending Previously placed Mid LAD stent (unknown type) is patent.  Left Circumflex discrete .  Intervention  No interventions have been documented.   STRESS TESTS  MYOCARDIAL PERFUSION IMAGING 11/20/2016  Narrative  The left ventricular ejection fraction is normal (55-65%).  Nuclear stress EF: 62%.  T wave inversion of 4 mm was noted during stress in the II, III, aVF, V6, V5 and V4 leads. T wave inversion persisted.  There was no ST segment deviation noted during stress.  This is a low risk study.  No reversible ischemia. RV uptake noted which could suggest elevated PA pressure. LVEF 62% with normal wall motion. This is a low risk study.   ECHOCARDIOGRAM  ECHOCARDIOGRAM COMPLETE  11/13/2023  Narrative ECHOCARDIOGRAM REPORT    Patient Name:   Jill Preston Date of Exam: 11/13/2023 Medical Rec #:  [id number]         Height:       65.0 in Accession #:    [id number]        Weight:       170.0 lb Date of Birth:  10/01/1932        BSA:          1.846 m Patient Age:    89 years          BP:           111/57 mmHg Patient Gender: F                 HR:           59 bpm. Exam Location:  Inpatient  Procedure: 2D Echo, Color Doppler and Cardiac Doppler (Both Spectral and Color Flow Doppler were utilized during procedure).  Indications:    Elevated Troponins  History:        Patient has prior history of Echocardiogram examinations, most recent 04/22/2020. CHF, CAD; Risk Factors:Hypertension and Dyslipidemia.  Sonographer:    Sherline Distel Senior RDCS Referring Phys: [id number] Marijo Shove THOMPSON  IMPRESSIONS  1. Left ventricular ejection fraction, by estimation, is 55 to 60%. The left ventricle has normal function. The left ventricle has no regional wall motion abnormalities. Left ventricular diastolic parameters are consistent with Grade II diastolic dysfunction (pseudonormalization). 2. Right ventricular systolic function is normal. The right ventricular size is normal. Tricuspid regurgitation signal is inadequate for assessing PA pressure. 3. Left atrial size was mild to moderately dilated. 4. The mitral valve is normal in structure. Mild mitral valve regurgitation. 5. The aortic valve is tricuspid. There is mild calcification of the aortic valve. Aortic valve regurgitation is not visualized. 6. The inferior vena cava is normal in size with <50% respiratory variability, suggesting right atrial pressure of 8 mmHg.  FINDINGS Left Ventricle: Left ventricular ejection fraction, by estimation, is 55 to 60%. The left ventricle has normal function. The left ventricle has no regional wall motion abnormalities. Strain imaging was not performed. The left ventricular internal cavity size  was normal in size. There is no left ventricular hypertrophy. Left ventricular diastolic parameters are consistent with Grade II diastolic dysfunction (pseudonormalization).  Right Ventricle: The right ventricular size is normal. No increase in right ventricular wall thickness. Right ventricular systolic function is normal. Tricuspid regurgitation signal is inadequate for assessing PA pressure.  Left Atrium: Left atrial size was mild to moderately dilated.  Right Atrium: Right atrial size was normal in size.  Pericardium: There is no evidence of pericardial effusion. Presence of epicardial fat layer.  Mitral Valve: The mitral valve is normal in structure. Mild mitral annular calcification. Mild mitral valve regurgitation.  Tricuspid Valve: The tricuspid valve is normal in structure. Tricuspid valve regurgitation is trivial.  Aortic Valve: The aortic valve is tricuspid. There is mild calcification of the aortic valve. Aortic valve regurgitation is not visualized.  Pulmonic Valve: The pulmonic valve was normal in structure. Pulmonic valve regurgitation is trivial.  Aorta: The aortic root and ascending aorta are structurally normal, with no evidence of dilitation.  Venous: The inferior vena cava is normal in size with less than 50% respiratory variability, suggesting right atrial pressure of 8 mmHg.  IAS/Shunts: No atrial level shunt detected by color flow Doppler.  Additional Comments: 3D imaging was not performed.   LEFT VENTRICLE PLAX 2D LVIDd:         4.40 cm   Diastology LVIDs:         3.00 cm   LV e' medial:    7.46 cm/s LV PW:         0.70 cm   LV E/e' medial:  11.4 LV IVS:        0.80 cm   LV e' lateral:   8.08 cm/s LVOT diam:     1.90 cm   LV E/e' lateral: 10.5 LV SV:         86 LV SV Index:   47 LVOT Area:     2.84 cm   RIGHT VENTRICLE RV S prime:     10.70 cm/s TAPSE (M-mode): 1.8 cm  LEFT ATRIUM             Index        RIGHT ATRIUM           Index LA diam:         3.70 cm 2.00 cm/m   RA Area:     13.80 cm LA Vol (A2C):   81.6 ml 44.20 ml/m  RA Volume:   31.90 ml  17.28 ml/m LA Vol (A4C):   61.5 ml  33.31 ml/m LA Biplane Vol: 70.8 ml 38.35 ml/m AORTIC VALVE LVOT Vmax:   137.00 cm/s LVOT Vmean:  89.000 cm/s LVOT VTI:    0.305 m  AORTA Ao Root diam: 2.90 cm Ao Asc diam:  2.90 cm  MITRAL VALVE MV Area (PHT): 2.55 cm    SHUNTS MV Decel Time: 298 msec    Systemic VTI:  0.30 m MV E velocity: 85.20 cm/s  Systemic Diam: 1.90 cm MV A velocity: 46.30 cm/s MV E/A ratio:  1.84  Jules Oar MD Electronically signed by Jules Oar MD Signature Date/Time: 11/13/2023/2:50:30 PM    Final          ______________________________________________________________________________________________      Risk Assessment/Calculations          Physical Exam VS:  BP (!) 140/60   Pulse (!) 51   Ht 5' 5.5" (1.664 m)   Wt 195 lb 12.8 oz (88.8 kg)   SpO2 94%   BMI 32.09 kg/m    Wt Readings from Last 3 Encounters:  02/18/24 195 lb 12.8 oz (88.8 kg)  02/14/24 191 lb 9.6 oz (86.9 kg)  01/28/24 186 lb (84.4 kg)    GEN: Well nourished, well developed in no acute distress NECK: No JVD; No carotid bruits CARDIAC: RRR, no murmurs, rubs, gallops RESPIRATORY: Mild intermittent bibasilar crackle mild intermittent bibasilar crackle ABDOMEN: Soft, non-tender, non-distended EXTREMITIES: Trace bilateral lower extremity edema; No deformity   ASSESSMENT AND PLAN  Acute on chronic diastolic heart failure: She ran out of her medication for 4 days, I asked her to resume her torsemide .  She will take 40 mg today and tomorrow then change to 40 mg on Tuesday and Friday and 20 mg on all the other days.  CAD: Denies any chest pain  Hypertension: Blood pressure mildly elevated, however patient is clearly volume overloaded.  I decided to hold off on adjusting her blood pressure medication until she can be adequately diuresed.  Hyperlipidemia: On  pravastatin        Dispo: Follow-up in 2 weeks for reassessment  Signed, Amariana Mirando, PA

## 2024-02-18 NOTE — Patient Instructions (Signed)
 Medication Instructions:  TAKE 2 TABLETS OF TORSEMIDE  TODAY AND THEN GO BACK TO 40 MG ON TUESDAYS AND FRIDAYS AND 20 MG THE REST OF THE DAYS IN THE WEEK *If you need a refill on your cardiac medications before your next appointment, please call your pharmacy*  Lab Work: BMP IN 1 WEEK If you have labs (blood work) drawn today and your tests are completely normal, you will receive your results only by: MyChart Message (if you have MyChart) OR A paper copy in the mail If you have any lab test that is abnormal or we need to change your treatment, we will call you to review the results.  Testing/Procedures: NO TESTING  Follow-Up: At [facility], you and your health needs are our priority.  As part of our continuing mission to provide you with exceptional heart care, our providers are all part of one team.  This team includes your primary Cardiologist (physician) and Advanced Practice Providers or APPs (Physician Assistants and Nurse Practitioners) who all work together to provide you with the care you need, when you need it.  Your next appointment:   2 week(s)  Provider:   Ervin Heath, PA  We recommend signing up for the patient portal called "MyChart".  Sign up information is provided on this After Visit Summary.  MyChart is used to connect with patients for Virtual Visits (Telemedicine).  Patients are able to view lab/test results, encounter notes, upcoming appointments, etc.  Non-urgent messages can be sent to your provider as well.   To learn more about what you can do with MyChart, go to [email or web address].   Other Instructions GIVE OFFICE A CALL IF WEIGHT CONTINUES TO GO UP.

## 2024-02-19 ENCOUNTER — Other Ambulatory Visit (HOSPITAL_COMMUNITY): Payer: Self-pay

## 2024-02-20 ENCOUNTER — Other Ambulatory Visit (HOSPITAL_COMMUNITY): Payer: Self-pay

## 2024-02-21 ENCOUNTER — Other Ambulatory Visit (HOSPITAL_COMMUNITY): Payer: Self-pay

## 2024-02-21 ENCOUNTER — Other Ambulatory Visit: Payer: Self-pay

## 2024-02-21 ENCOUNTER — Other Ambulatory Visit (HOSPITAL_BASED_OUTPATIENT_CLINIC_OR_DEPARTMENT_OTHER): Payer: Self-pay

## 2024-02-21 MED ORDER — TORSEMIDE 20 MG PO TABS: 20.0000 mg | ORAL_TABLET | Freq: Every day | ORAL | 3 refills | Status: DC

## 2024-02-21 MED ORDER — TORSEMIDE 20 MG PO TABS
20.0000 mg | ORAL_TABLET | Freq: Every day | ORAL | 11 refills | Status: DC
  Filled 2024-02-21 – 2024-02-24 (×2): qty 30, 30d supply, fill #0

## 2024-02-21 MED ORDER — TORSEMIDE 20 MG PO TABS: 20.0000 mg | ORAL_TABLET | ORAL | 3 refills | Status: DC

## 2024-02-21 MED ORDER — LATANOPROST 0.005 % OP SOLN
1.0000 [drp] | Freq: Every day | OPHTHALMIC | 3 refills | Status: AC
  Filled 2024-02-21: qty 7.5, 75d supply, fill #0
  Filled 2024-05-11: qty 7.5, 75d supply, fill #1
  Filled 2024-07-18: qty 7.5, 75d supply, fill #2
  Filled 2024-10-01: qty 7.5, 75d supply, fill #3

## 2024-02-21 MED ORDER — AMLODIPINE BESYLATE 2.5 MG PO TABS
2.5000 mg | ORAL_TABLET | Freq: Every day | ORAL | 3 refills | Status: DC
  Filled 2024-02-21: qty 90, 90d supply, fill #0

## 2024-02-21 NOTE — Telephone Encounter (Signed)
 Noted

## 2024-02-24 ENCOUNTER — Other Ambulatory Visit: Payer: Self-pay

## 2024-02-24 ENCOUNTER — Other Ambulatory Visit (HOSPITAL_COMMUNITY): Payer: Self-pay

## 2024-02-24 ENCOUNTER — Telehealth: Payer: Self-pay | Admitting: Cardiovascular Disease

## 2024-02-24 MED ORDER — EZETIMIBE 10 MG PO TABS
10.0000 mg | ORAL_TABLET | Freq: Every morning | ORAL | 3 refills | Status: AC
  Filled 2024-02-24: qty 90, 90d supply, fill #0
  Filled 2024-05-18: qty 90, 90d supply, fill #1
  Filled 2024-08-16: qty 90, 90d supply, fill #2

## 2024-02-24 MED ORDER — POTASSIUM CHLORIDE CRYS ER 20 MEQ PO TBCR
20.0000 meq | EXTENDED_RELEASE_TABLET | Freq: Two times a day (BID) | ORAL | 3 refills | Status: DC
  Filled 2024-02-24: qty 225, 90d supply, fill #0

## 2024-02-24 MED FILL — Latanoprost Ophth Soln 0.005%: OPHTHALMIC | 50 days supply | Qty: 2.5 | Fill #0 | Status: CN

## 2024-02-24 NOTE — Telephone Encounter (Signed)
 Pt's medications were sent to pt's pharmacy as requested. Confirmation received.

## 2024-02-24 NOTE — Telephone Encounter (Signed)
*  STAT* If patient is at the pharmacy, call can be transferred to refill team.   1. Which medications need to be refilled? (please list name of each medication and dose if known) ezetimibe  (ZETIA ) 10 MG tablet  potassium chloride  SA (KLOR-CON  M) 20 MEQ tablet    2. Would you like to learn more about the convenience, safety, & potential cost savings by using the [facility]?    3. Are you open to using the [facility] (Type [facility].  ).   4. Which pharmacy/location (including street and city if local pharmacy) is medication to be sent to? [place] [facility]    5. Do they need a 30 day or 90 day supply? 90 day

## 2024-02-25 ENCOUNTER — Telehealth: Payer: Self-pay | Admitting: Cardiovascular Disease

## 2024-02-25 ENCOUNTER — Other Ambulatory Visit (HOSPITAL_COMMUNITY): Payer: Self-pay

## 2024-02-25 DIAGNOSIS — I5033 Acute on chronic diastolic (congestive) heart failure: Secondary | ICD-10-CM | POA: Diagnosis not present

## 2024-02-25 LAB — BASIC METABOLIC PANEL WITH GFR
BUN/Creatinine Ratio: 17 (ref 12–28)
BUN: 15 mg/dL (ref 8–27)
CO2: 24 mmol/L (ref 20–29)
Calcium: 9.2 mg/dL (ref 8.7–10.3)
Chloride: 103 mmol/L (ref 96–106)
Creatinine, Ser: 0.89 mg/dL (ref 0.57–1.00)
Glucose: 144 mg/dL — ABNORMAL HIGH (ref 70–99)
Potassium: 4.1 mmol/L (ref 3.5–5.2)
Sodium: 141 mmol/L (ref 134–144)
eGFR: 62 mL/min/{1.73_m2} (ref >59)

## 2024-02-25 NOTE — Telephone Encounter (Signed)
 Called pt and left message informing pt that her medications were already sent to pt's pharmacy as requested and that pt would have to contact PCP for a refill on medication Gabapentin . I advised the pt that if she has any other problems, questions or concerns, to give our office a call back.

## 2024-02-25 NOTE — Telephone Encounter (Signed)
*  STAT* If patient is at the pharmacy, call can be transferred to refill team.   1. Which medications need to be refilled? (please list name of each medication and dose if known) new prescriptions for Gabapentin , Torsemide , Ezetimibe , and Potassium   2. Would you like to learn more about the convenience, safety, & potential cost savings by using the [facility]?     3. Are you open to using the [facility] (Type [facility]. .   4. Which pharmacy/location (including street and city if local pharmacy) is medication to be sent to? [facility] [place], [place]   5. Do they need a 30 day or 90 day supply? 90 days and refills- please call today- out of medicine

## 2024-02-26 ENCOUNTER — Ambulatory Visit: Payer: Self-pay | Admitting: Physician Assistant

## 2024-03-02 ENCOUNTER — Ambulatory Visit: Admitting: Emergency Medicine

## 2024-03-04 ENCOUNTER — Ambulatory Visit: Attending: Cardiology | Admitting: Physician Assistant

## 2024-03-04 ENCOUNTER — Other Ambulatory Visit: Payer: Self-pay

## 2024-03-04 ENCOUNTER — Encounter: Payer: Self-pay | Admitting: Physician Assistant

## 2024-03-04 ENCOUNTER — Other Ambulatory Visit (HOSPITAL_COMMUNITY): Payer: Self-pay

## 2024-03-04 VITALS — BP 130/73 | HR 79 | Ht 65.5 in | Wt 190.2 lb

## 2024-03-04 DIAGNOSIS — I2510 Atherosclerotic heart disease of native coronary artery without angina pectoris: Secondary | ICD-10-CM

## 2024-03-04 DIAGNOSIS — I10 Essential (primary) hypertension: Secondary | ICD-10-CM | POA: Diagnosis not present

## 2024-03-04 DIAGNOSIS — E785 Hyperlipidemia, unspecified: Secondary | ICD-10-CM

## 2024-03-04 DIAGNOSIS — I5032 Chronic diastolic (congestive) heart failure: Secondary | ICD-10-CM

## 2024-03-04 MED ORDER — TORSEMIDE 20 MG PO TABS
40.0000 mg | ORAL_TABLET | Freq: Every day | ORAL | 1 refills | Status: DC
  Filled 2024-03-04: qty 180, 90d supply, fill #0
  Filled 2024-03-14: qty 60, 30d supply, fill #0
  Filled 2024-03-14: qty 180, 90d supply, fill #0
  Filled 2024-04-10 – 2024-04-13 (×2): qty 60, 30d supply, fill #1
  Filled 2024-05-11 – 2024-05-12 (×2): qty 60, 30d supply, fill #2
  Filled 2024-06-08: qty 60, 30d supply, fill #3
  Filled 2024-07-05: qty 60, 30d supply, fill #4
  Filled 2024-08-03: qty 60, 30d supply, fill #5

## 2024-03-04 MED ORDER — POTASSIUM CHLORIDE CRYS ER 20 MEQ PO TBCR
EXTENDED_RELEASE_TABLET | ORAL | 1 refills | Status: DC
  Filled 2024-03-04: qty 270, fill #0
  Filled 2024-05-11: qty 270, 90d supply, fill #0
  Filled 2024-08-02: qty 270, 90d supply, fill #1

## 2024-03-04 MED ORDER — PRAVASTATIN SODIUM 80 MG PO TABS
80.0000 mg | ORAL_TABLET | Freq: Every day | ORAL | 3 refills | Status: AC
  Filled 2024-03-04: qty 90, 90d supply, fill #0
  Filled 2024-05-26: qty 90, 90d supply, fill #1
  Filled 2024-08-24: qty 90, 90d supply, fill #2

## 2024-03-04 NOTE — Progress Notes (Signed)
 Cardiology Office Note   Date:  03/04/2024  ID:  Jill Preston, DOB [birth year], MRN [id number] PCP: Leonarda Roxan BROCKS, NP  [facility] Providers Cardiologist:  Jerel Balding, MD     History of Present Illness Jill Preston is a 89 y.o. female with past medical history of chronic diastolic heart failure, CKD, sick sinus syndrome, hypertension, hyperlipidemia and CAD.  Patient previously underwent left and right heart cath in May 2016 that showed 70% lesion in the left main, 60% lesion in the ostial left circumflex, 20 to 30% stenosis in proximal RCA.  Surgical consultation was recommended for possible CABG.  She underwent a coronary CTA with FFR in May 2016 that showed moderate distal left main stenosis was not flow-limiting.  Patient again underwent left heart cath in July 2018 that showed 70% distal left main lesion, angiographically similar to 2016.  FFR was 0.89 into LAD and 0.86 into the left circumflex artery suggesting the lesion was not flow-limiting.  Patient had otherwise nonobstructive CAD and patent stent in the LAD.  Echocardiogram in August 2021 showed EF 60 to 65%, no regional wall motion abnormality, grade 2 DD.  Carotid ultrasound showed no significant carotid artery disease.  Patient was admitted in March 2025 with orthostatic hypotension and frequent fall.  She was found to have a urinary tract infection and was given IV fluid.  Due to low blood pressure, her Imdur , amlodipine  and torsemide  were held during the admission.  On discharge, meds were resumed.  It was suspected that her frequent falls are likely due to orthostatic hypotension in the setting of UTI.  Echocardiogram obtained on 11/13/2023 showed EF 55 to 60%, grade 2 DD, normal RV function, mild to moderate aortic dilatation, mild MR.  Patient was most recently seen by Dr. Balding on 01/07/2024 at which time she appears to be euvolemic.  She was seen by Dr. Waddell on 01/28/2024 and mentions with Dr. Waddell that  she has been out of her torsemide  and gained some weight back.  She had dyspnea on exertion.  Therefore she was restarted on the torsemide  with the instruction of taking 40 mg of torsemide  on Tuesday and Friday and 20 mg daily all the other days.   I last saw the patient on 03/16/2024, her weight was up after missing her diuretic for 4 days.  I instructed the patient to take 2 tablet of torsemide  for 2 days before going back to the previous regiment of 40 mg on Tuesday and Friday and 20 mg on all the other days.  She presents today for reassessment.  Based on her home diary, her weight is still up around 190, it did go down to 185.  She is still coughing every time she tried to lay down.  She has been coughing for about a month, she describes the cough as a nonproductive cough.  She denies any fever or chill.  I will obtain a chest x-ray to make sure she does not have any pulmonary issue.  On physical exam, she has 1+ pitting edema in bilateral ankle, I will increase her torsemide  to 40 mg daily.  I will also increase her potassium chloride  to 40 mEq a.m. and 20 mEq p.m.  Physical exam, she has bibasilar crackles.  She denies any recent chest discomfort.  I plan to see the patient back in 2 weeks.  She will have a basic metabolic panel today and also in 1 week.    ROS:   Patient has  lower extremity edema, she denies any orthopnea or PND.  She denies any chest pain.  Studies Reviewed      Cardiac Studies & Procedures   ______________________________________________________________________________________________ CARDIAC CATHETERIZATION  CARDIAC CATHETERIZATION 04/05/2017  Conclusion  LM lesion, 70 %stenosed.  Mid LAD lesion, 0 %stenosed at site of prior stent.  Ost Cx lesion, 60 %stenosed.  Prox RCA-1 lesion, 20 %stenosed.  Prox RCA-2 lesion, 30 %stenosed.  The left ventricular systolic function is normal.  LV end diastolic pressure is normal.  The left ventricular ejection fraction  is 55-65% by visual estimate.  1. 70% eccentric distal left main stenosis. Angiographically similar to 2016. FFR 0.89 into the LAD and 0.86 into the LCx suggesting that this lesion is not flow limiting. 2. Otherwise nonobstructive CAD. Patent stent in LAD. 3. Normal LV function 4. Normal LVEDP  Plan: recommend continued medical therapy.  Findings Coronary Findings Diagnostic  Dominance: Right  Left Main The lesion is eccentric. The lesion is calcified. Pressure wire/FFR was performed on the lesion. FFR: 0.86.  Left Anterior Descending Mid LAD lesion with no stenosis was previously treated.  Left Circumflex  First Obtuse Marginal Branch  Right Coronary Artery  Intervention  No interventions have been documented.   CARDIAC CATHETERIZATION  CARDIAC CATHETERIZATION 01/27/2015  Conclusion  Ost Cx lesion, 60% stenosed.  LM lesion, 50% stenosed.  1. Moderate distal left main artery stenosis with fractional flow reserve assessment suggesting the lesion is not flow limiting. (FFR=0.91) 2. Moderate ostial Circumflex stenosis  Recommendations: Medical management of CAD. I would not pursue revascularization strategies at this time. Her chest pressure has been present continuously for months with no relief suggesting it is not cardiac related.  Findings Coronary Findings Diagnostic  Dominance: Right  Left Main discrete .   Pressure wire/FFR was performed on the lesion.  FFR: 0.91.  Left Anterior Descending Previously placed Mid LAD stent (unknown type) is patent.  Left Circumflex discrete .  Intervention  No interventions have been documented.   STRESS TESTS  MYOCARDIAL PERFUSION IMAGING 11/20/2016  Narrative  The left ventricular ejection fraction is normal (55-65%).  Nuclear stress EF: 62%.  T wave inversion of 4 mm was noted during stress in the II, III, aVF, V6, V5 and V4 leads. T wave inversion persisted.  There was no ST segment deviation noted during  stress.  This is a low risk study.  No reversible ischemia. RV uptake noted which could suggest elevated PA pressure. LVEF 62% with normal wall motion. This is a low risk study.   ECHOCARDIOGRAM  ECHOCARDIOGRAM COMPLETE 11/13/2023  Narrative ECHOCARDIOGRAM REPORT    Patient Name:   AISLYN HAYSE Date of Exam: 11/13/2023 Medical Rec #:  [id number]         Height:       65.0 in Accession #:    [id number]        Weight:       170.0 lb Date of Birth:  08-15-1933        BSA:          1.846 m Patient Age:    89 years          BP:           111/57 mmHg Patient Gender: F                 HR:           59 bpm. Exam Location:  Inpatient  Procedure: 2D Echo, Color Doppler and  Cardiac Doppler (Both Spectral and Color Flow Doppler were utilized during procedure).  Indications:    Elevated Troponins  History:        Patient has prior history of Echocardiogram examinations, most recent 04/22/2020. CHF, CAD; Risk Factors:Hypertension and Dyslipidemia.  Sonographer:    Damien Senior RDCS Referring Phys: [id number] DANIEL V THOMPSON  IMPRESSIONS   1. Left ventricular ejection fraction, by estimation, is 55 to 60%. The left ventricle has normal function. The left ventricle has no regional wall motion abnormalities. Left ventricular diastolic parameters are consistent with Grade II diastolic dysfunction (pseudonormalization). 2. Right ventricular systolic function is normal. The right ventricular size is normal. Tricuspid regurgitation signal is inadequate for assessing PA pressure. 3. Left atrial size was mild to moderately dilated. 4. The mitral valve is normal in structure. Mild mitral valve regurgitation. 5. The aortic valve is tricuspid. There is mild calcification of the aortic valve. Aortic valve regurgitation is not visualized. 6. The inferior vena cava is normal in size with <50% respiratory variability, suggesting right atrial pressure of 8 mmHg.  FINDINGS Left Ventricle: Left  ventricular ejection fraction, by estimation, is 55 to 60%. The left ventricle has normal function. The left ventricle has no regional wall motion abnormalities. Strain imaging was not performed. The left ventricular internal cavity size was normal in size. There is no left ventricular hypertrophy. Left ventricular diastolic parameters are consistent with Grade II diastolic dysfunction (pseudonormalization).  Right Ventricle: The right ventricular size is normal. No increase in right ventricular wall thickness. Right ventricular systolic function is normal. Tricuspid regurgitation signal is inadequate for assessing PA pressure.  Left Atrium: Left atrial size was mild to moderately dilated.  Right Atrium: Right atrial size was normal in size.  Pericardium: There is no evidence of pericardial effusion. Presence of epicardial fat layer.  Mitral Valve: The mitral valve is normal in structure. Mild mitral annular calcification. Mild mitral valve regurgitation.  Tricuspid Valve: The tricuspid valve is normal in structure. Tricuspid valve regurgitation is trivial.  Aortic Valve: The aortic valve is tricuspid. There is mild calcification of the aortic valve. Aortic valve regurgitation is not visualized.  Pulmonic Valve: The pulmonic valve was normal in structure. Pulmonic valve regurgitation is trivial.  Aorta: The aortic root and ascending aorta are structurally normal, with no evidence of dilitation.  Venous: The inferior vena cava is normal in size with less than 50% respiratory variability, suggesting right atrial pressure of 8 mmHg.  IAS/Shunts: No atrial level shunt detected by color flow Doppler.  Additional Comments: 3D imaging was not performed.   LEFT VENTRICLE PLAX 2D LVIDd:         4.40 cm   Diastology LVIDs:         3.00 cm   LV e' medial:    7.46 cm/s LV PW:         0.70 cm   LV E/e' medial:  11.4 LV IVS:        0.80 cm   LV e' lateral:   8.08 cm/s LVOT diam:     1.90 cm   LV  E/e' lateral: 10.5 LV SV:         86 LV SV Index:   47 LVOT Area:     2.84 cm   RIGHT VENTRICLE RV S prime:     10.70 cm/s TAPSE (M-mode): 1.8 cm  LEFT ATRIUM             Index  RIGHT ATRIUM           Index LA diam:        3.70 cm 2.00 cm/m   RA Area:     13.80 cm LA Vol (A2C):   81.6 ml 44.20 ml/m  RA Volume:   31.90 ml  17.28 ml/m LA Vol (A4C):   61.5 ml 33.31 ml/m LA Biplane Vol: 70.8 ml 38.35 ml/m AORTIC VALVE LVOT Vmax:   137.00 cm/s LVOT Vmean:  89.000 cm/s LVOT VTI:    0.305 m  AORTA Ao Root diam: 2.90 cm Ao Asc diam:  2.90 cm  MITRAL VALVE MV Area (PHT): 2.55 cm    SHUNTS MV Decel Time: 298 msec    Systemic VTI:  0.30 m MV E velocity: 85.20 cm/s  Systemic Diam: 1.90 cm MV A velocity: 46.30 cm/s MV E/A ratio:  1.84  Toribio Fuel MD Electronically signed by Toribio Fuel MD Signature Date/Time: 11/13/2023/2:50:30 PM    Final          ______________________________________________________________________________________________      Risk Assessment/Calculations           Physical Exam VS:  BP 130/73   Pulse 79   Ht 5' 5.5 (1.664 m)   Wt 190 lb 3.2 oz (86.3 kg)   SpO2 95%   BMI 31.17 kg/m    Wt Readings from Last 3 Encounters:  03/04/24 190 lb 3.2 oz (86.3 kg)  02/18/24 195 lb 12.8 oz (88.8 kg)  02/14/24 191 lb 9.6 oz (86.9 kg)    GEN: Well nourished, well developed in no acute distress NECK: No JVD; No carotid bruits CARDIAC: RRR, no murmurs, rubs, gallops RESPIRATORY:  Clear to auscultation without rales, wheezing or rhonchi  ABDOMEN: Soft, non-tender, non-distended EXTREMITIES:  No edema; No deformity   ASSESSMENT AND PLAN  Chronic diastolic heart failure: Patient continued to have lower extremity edema, will increase torsemide  to 40 mg daily and increase potassium supplement accordingly.  CAD: Denies any chest pain  Hypertension: Blood pressure stable  Hyperlipidemia: Continue pravastatin  and Zetia .         Dispo: Follow-up in 2 weeks  Signed, Sabrin Dunlevy, PA

## 2024-03-04 NOTE — Patient Instructions (Signed)
 Medication Instructions:  INCREASE TORSEMIDE  TO 40 MG DAILY  INCREASE POTASSIUM CHLORIDE  TO 2 TABLETS (40 MEQ) IN THE AM AND 1 TABLET (20 MEQ) IN THE PM   *If you need a refill on your cardiac medications before your next appointment, please call your pharmacy*  Lab Work: BMET TODAY   BMET IN 1 WEEK If you have labs (blood work) drawn today and your tests are completely normal, you will receive your results only by: MyChart Message (if you have MyChart) OR A paper copy in the mail If you have any lab test that is abnormal or we need to change your treatment, we will call you to review the results.  Testing/Procedures: The chest X ray has been ordered. Will be performed at [address]   Follow-Up: At [facility], you and your health needs are our priority.  As part of our continuing mission to provide you with exceptional heart care, our providers are all part of one team.  This team includes your primary Cardiologist (physician) and Advanced Practice Providers or APPs (Physician Assistants and Nurse Practitioners) who all work together to provide you with the care you need, when you need it.  Your next appointment:   2-3 week(s)  Provider:   Ervin Heath, PA    We recommend signing up for the patient portal called MyChart.  Sign up information is provided on this After Visit Summary.  MyChart is used to connect with patients for Virtual Visits (Telemedicine).  Patients are able to view lab/test results, encounter notes, upcoming appointments, etc.  Non-urgent messages can be sent to your provider as well.   To learn more about what you can do with MyChart, go to [email or web address].

## 2024-03-05 ENCOUNTER — Ambulatory Visit: Payer: Self-pay | Admitting: Physician Assistant

## 2024-03-05 ENCOUNTER — Other Ambulatory Visit (HOSPITAL_COMMUNITY): Payer: Self-pay

## 2024-03-05 LAB — BASIC METABOLIC PANEL WITH GFR
BUN/Creatinine Ratio: 17 (ref 12–28)
BUN: 15 mg/dL (ref 8–27)
CO2: 25 mmol/L (ref 20–29)
Calcium: 9.1 mg/dL (ref 8.7–10.3)
Chloride: 107 mmol/L — ABNORMAL HIGH (ref 96–106)
Creatinine, Ser: 0.9 mg/dL (ref 0.57–1.00)
Glucose: 125 mg/dL — ABNORMAL HIGH (ref 70–99)
Potassium: 4.4 mmol/L (ref 3.5–5.2)
Sodium: 147 mmol/L — ABNORMAL HIGH (ref 134–144)
eGFR: 61 mL/min/{1.73_m2} (ref >59)

## 2024-03-05 NOTE — Progress Notes (Signed)
Stable renal function and electrolyte

## 2024-03-09 ENCOUNTER — Telehealth: Payer: Self-pay | Admitting: Cardiovascular Disease

## 2024-03-09 ENCOUNTER — Other Ambulatory Visit (HOSPITAL_COMMUNITY): Payer: Self-pay

## 2024-03-09 NOTE — Telephone Encounter (Signed)
 Called Pharmacy and they received RX. Informed me that the RX was sent out on 03/04/24 and to have the Pt contact if issues continue. I called Pt and she told me that her RX hasn't arrived. I told the Pt to call the Pharmacy and that they would help to look in to it. She verbalized understanding.

## 2024-03-09 NOTE — Telephone Encounter (Signed)
*  STAT* If patient is at the pharmacy, call can be transferred to refill team.   1. Which medications need to be refilled? (please list name of each medication and dose if known). pravastatin  (PRAVACHOL ) 80 MG tablet  2. Which pharmacy/location (including street and city if local pharmacy) is medication to be sent to? [place] [facility]  3. Do they need a 30 day or 90 day supply?   90 day supply  Patient says the pharmacy didn't receive the prescription.

## 2024-03-11 DIAGNOSIS — I5032 Chronic diastolic (congestive) heart failure: Secondary | ICD-10-CM | POA: Diagnosis not present

## 2024-03-12 LAB — BASIC METABOLIC PANEL WITH GFR
BUN/Creatinine Ratio: 16 (ref 12–28)
BUN: 15 mg/dL (ref 8–27)
CO2: 26 mmol/L (ref 20–29)
Calcium: 9.2 mg/dL (ref 8.7–10.3)
Chloride: 101 mmol/L (ref 96–106)
Creatinine, Ser: 0.93 mg/dL (ref 0.57–1.00)
Glucose: 136 mg/dL — ABNORMAL HIGH (ref 70–99)
Potassium: 4 mmol/L (ref 3.5–5.2)
Sodium: 144 mmol/L (ref 134–144)
eGFR: 59 mL/min/{1.73_m2} — ABNORMAL LOW (ref >59)

## 2024-03-14 ENCOUNTER — Other Ambulatory Visit (HOSPITAL_COMMUNITY): Payer: Self-pay

## 2024-03-16 ENCOUNTER — Other Ambulatory Visit: Payer: Self-pay

## 2024-03-18 ENCOUNTER — Ambulatory Visit (HOSPITAL_COMMUNITY)
Admission: RE | Admit: 2024-03-18 | Discharge: 2024-03-18 | Disposition: A | Discharge status: Home / Self Care | Source: Ambulatory Visit | Attending: Cardiology | Admitting: Cardiology

## 2024-03-18 DIAGNOSIS — R059 Cough, unspecified: Secondary | ICD-10-CM | POA: Diagnosis not present

## 2024-03-18 DIAGNOSIS — I5032 Chronic diastolic (congestive) heart failure: Secondary | ICD-10-CM | POA: Insufficient documentation

## 2024-03-23 ENCOUNTER — Ambulatory Visit: Payer: PPO | Admitting: Family

## 2024-03-23 NOTE — Progress Notes (Signed)
 Normal chest x-ray.

## 2024-03-23 NOTE — Progress Notes (Signed)
 No acute disease, suspect the crackles I heard in the base on the lung related to mild scarring of the lung base, but this is a chronic change, not acute finding.

## 2024-03-25 ENCOUNTER — Ambulatory Visit (INDEPENDENT_AMBULATORY_CARE_PROVIDER_SITE_OTHER): Admitting: Adult Health

## 2024-03-25 ENCOUNTER — Encounter: Payer: Self-pay | Admitting: Adult Health

## 2024-03-25 VITALS — BP 130/80 | HR 64 | Temp 97.7°F | Resp 20 | Ht 65.5 in | Wt 189.4 lb

## 2024-03-25 DIAGNOSIS — E039 Hypothyroidism, unspecified: Secondary | ICD-10-CM | POA: Diagnosis not present

## 2024-03-25 DIAGNOSIS — I5032 Chronic diastolic (congestive) heart failure: Secondary | ICD-10-CM | POA: Diagnosis not present

## 2024-03-25 DIAGNOSIS — I739 Peripheral vascular disease, unspecified: Secondary | ICD-10-CM | POA: Diagnosis not present

## 2024-03-25 NOTE — Progress Notes (Signed)
 Location:  [facility]   Place of Service:   psc clinic   CODE STATUS:   Allergies  Allergen Reactions   Atorvastatin Anaphylaxis and Other (See Comments)    Patient does not remember; caused pneumonia- took her off of it per patient   Penicillins Anaphylaxis, Hives, Swelling and Other (See Comments)    Tongue swelling Did it involve swelling of the face/tongue/throat, SOB, or low BP? Yes Did it involve sudden or severe rash/hives, skin peeling, or any reaction on the inside of your mouth or nose? Yes Did you need to seek medical attention at a hospital or doctor's office? Yes When did it last happen?  21 or 89 years old    If all above answers are NO, may proceed with cephalosporin use.    Shellfish Allergy Anaphylaxis, Nausea And Vomiting and Other (See Comments)    Severe nausea and vomiting   Aminophylline  Itching, Swelling, Rash and Other (See Comments)    Pt experienced burning urination, itching, redness/rash, and swelling of genitals after given this medication via IV push.   Iodinated Contrast Media Swelling and Other (See Comments)    FLUSHING, also; LLE became swollen   Latex Other (See Comments)    Causes blisters   Oxybutynin Chloride Other (See Comments)    blisters   Propoxyphene Other (See Comments)    Unknown reaction   Vancomycin  Hives and Other (See Comments)    06/02/2018- Hives arm, back and chest   Adhesive [Tape] Rash and Other (See Comments)    Paper tape is ok   Betadine [Povidone Iodine] Rash   Ciprofloxacin Hives   Codeine  Nausea And Vomiting    .   Ranolazine  Other (See Comments)    Constipation  .    Chief Complaint  Patient presents with   Medical Management of Chronic Issues    6 Month follow up.    HPI:  She has been doing well without complaints or concerns. She continues to ambulate throughout her home. She does have some help to come to the house. She is without recent falls. Her weight remains stable. She is taking  her medications as prescribed.   Past Medical History:  Diagnosis Date   Arthritis    right knee   Cancer (HCC) yrs ago   skin cancer removed from face   Chronic diastolic CHF (congestive heart failure) (HCC)    Chronic venous insufficiency    LEA VENOUS, 10/17/2011 - mild reflux in bilateral common femoral veins   CKD (chronic kidney disease), stage III (HCC)    Coronary artery disease    a. s/p PCI/BMS to prox LAD and balloon angioplasty to mLAD with suboptimal result in 2000. b. Abnl nuc 2012, cath 09/2011 - showed that the overall territory of potential ischemia was small and attributable to a moderately diseased small second diagonal artery. Med rx.   Dyspnea    with activity   Headache    History of blood transfusion 2017   Hyperlipidemia    Hypertension    Hypertensive heart disease    Hypothyroidism    Obesity    Pneumonia    several times   Stroke [facility])    pt. states she had light stroke in sept. 1980   Urinary incontinence     Past Surgical History:  Procedure Laterality Date   ABDOMINAL HYSTERECTOMY  1970's   complete   ANKLE ARTHROSCOPY Right 07/10/2017   Procedure: ANKLE ARTHROSCOPY;  Surgeon: Gretel Ozell PARAS, DPM;  Location: [facility] OR;  Service: Podiatry;  Laterality: Right;   BACK SURGERY  07/26/2016   cervical neck surgery, [facility]   BALLOON DILATION N/A 12/01/2021   Procedure: BALLOON DILATION;  Surgeon: Rollin Dover, MD;  Location: [facility] ENDOSCOPY;  Service: Gastroenterology;  Laterality: N/A;   BLADDER SURGERY  2010   WITH MESH  bladder tach   bunion removal surgery Bilateral 15 yrs ago   CARDIAC CATHETERIZATION Left 09/25/2011   Medical management   CARDIAC CATHETERIZATION Left 03/25/2001   Normal LV function, LAD residual narrowing of less than 10%, normal ramus intermediate, circumflex, and RCA,    CARDIAC CATHETERIZATION  09/04/1999   LAD, 3x47mm Tetra stent resulting in a reduction of the 80% stenosis to 0% residual   CARDIAC  CATHETERIZATION N/A 01/26/2015   Procedure: Right/Left Heart Cath and Coronary Angiography;  Surgeon: Debby DELENA Sor, MD;  Location: [facility] INVASIVE CV LAB;  Service: Cardiovascular;  Laterality: N/A;   CARDIAC CATHETERIZATION N/A 01/27/2015   Procedure: Intravascular Pressure Wire/FFR Study;  Surgeon: Lonni JONETTA Cash, MD;  Location: [facility] INVASIVE CV LAB;  Service: Cardiovascular;  Laterality: N/A;   CARDIAC CATHETERIZATION N/A 01/27/2015   Procedure: Right Heart Cath;  Surgeon: Lonni JONETTA Cash, MD;  Location: [facility] INVASIVE CV LAB;  Service: Cardiovascular;  Laterality: N/A;   CHOLECYSTECTOMY     COLONOSCOPY WITH PROPOFOL  N/A 03/07/2017   Procedure: COLONOSCOPY WITH PROPOFOL ;  Surgeon: Kristie Lamprey, MD;  Location: [facility];  Service: Endoscopy;  Laterality: N/A;   CORONARY PRESSURE/FFR STUDY N/A 04/05/2017   Procedure: Intravascular Pressure Wire/FFR Study;  Surgeon: Swaziland, Peter M, MD;  Location: [facility] INVASIVE CV LAB;  Service: Cardiovascular;  Laterality: N/A;   CORONARY STENT PLACEMENT     LAD x 1   ESOPHAGOGASTRODUODENOSCOPY (EGD) WITH PROPOFOL  N/A 12/01/2021   Procedure: ESOPHAGOGASTRODUODENOSCOPY (EGD) WITH PROPOFOL ;  Surgeon: Rollin Dover, MD;  Location: [facility];  Service: Gastroenterology;  Laterality: N/A;   ganglion cyst removal  yrs ago   x 2   ILIAC VEIN ANGIOPLASTY / STENTING  02/15/2015   IVC FILTER INSERTION  2016   IVC FILTER REMOVAL  02/15/2015   at [facility]   LEFT HEART CATH AND CORONARY ANGIOGRAPHY N/A 04/05/2017   Procedure: Left Heart Cath and Coronary Angiography;  Surgeon: Swaziland, Peter M, MD;  Location: [facility] INVASIVE CV LAB;  Service: Cardiovascular;  Laterality: N/A;   LEFT HEART CATHETERIZATION WITH CORONARY ANGIOGRAM N/A 09/25/2011   Procedure: LEFT HEART CATHETERIZATION WITH CORONARY ANGIOGRAM;  Surgeon: Jerel Balding, MD;  Location: [facility] CATH LAB;  Service: Cardiovascular;  Laterality: N/A;   multiple bladder surgeries to remove mesh     ROTATOR CUFF REPAIR Right     stent to groin Left 08/2014   left leg   TENDON REPAIR Right 07/10/2017   Procedure: RIGHT PERONEAL TENDON REPAIR;  Surgeon: Gretel Ozell PARAS, DPM;  Location: [facility] OR;  Service: Podiatry;  Laterality: Right;   TENDON REPAIR Right 05/27/2019   Procedure: PERONEAL TENDON REPAIR x2;  Surgeon: Gretel Ozell PARAS, DPM;  Location: [facility] ORS;  Service: Podiatry;  Laterality: Right;    Social History   Socioeconomic History   Marital status: Married    Spouse name: Not on file   Number of children: Not on file   Years of education: Not on file   Highest education level: Not on file  Occupational History   Not on file  Tobacco Use   Smoking status: Never   Smokeless tobacco: Never  Vaping Use  Vaping status: Never Used  Substance and Sexual Activity   Alcohol use: No   Drug use: No   Sexual activity: Not on file  Other Topics Concern   Not on file  Social History Narrative   Per [facility] New Patient Packet Abstracted on 03/06/2021      Diet: Left blank       Caffeine: No      Married, if yes what year: Yes, 1955      Do you live in a house, apartment, assisted living, condo, trailer, ect: House      Is it one or more stories: 1 story      How many persons live in your home? 2      Pets: No      Highest level or education completed: 12 th grade      Current/Past profession: Research officer, political party       Exercise:          Yes       Type and how often: Walking          Living Will: Yes   DNR: No   POA/HPOA: Yes      Functional Status:   Do you have difficulty bathing or dressing yourself? Left Blank    Do you have difficulty preparing food or eating?Left Blank   Do you have difficulty managing your medications?Left Blank   Do you have difficulty managing your finances?Left Blank   Do you have difficulty affording your medications?Left Blank   Social Drivers of Health   Financial Resource Strain: Not on file  Food Insecurity: No Food Insecurity (11/18/2023)   Hunger Vital  Sign    Worried About Running Out of Food in the Last Year: Never true    Ran Out of Food in the Last Year: Never true  Recent Concern: Food Insecurity - Food Insecurity Present (11/13/2023)   Hunger Vital Sign    Worried About Running Out of Food in the Last Year: Sometimes true    Ran Out of Food in the Last Year: Sometimes true  Transportation Needs: No Transportation Needs (11/18/2023)   PRAPARE - Administrator, Civil Service (Medical): No    Lack of Transportation (Non-Medical): No  Physical Activity: Not on file  Stress: Not on file  Social Connections: Unknown (11/13/2023)   Social Connection and Isolation Panel    Frequency of Communication with Friends and Family: More than three times a week    Frequency of Social Gatherings with Friends and Family: Three times a week    Attends Religious Services: Patient declined    Active Member of Clubs or Organizations: No    Attends Banker Meetings: Never    Marital Status: Married  Catering manager Violence: Not At Risk (11/18/2023)   Humiliation, Afraid, Rape, and Kick questionnaire    Fear of Current or Ex-Partner: No    Emotionally Abused: No    Physically Abused: No    Sexually Abused: No   Family History  Problem Relation Age of Onset   Cancer Mother    Heart disease Father    Heart disease Sister    High blood pressure Sister    Heart disease Sister    Cancer Sister    Heart disease Brother    Diabetes Brother    Brain cancer Brother    High blood pressure Brother    Heart disease Brother    Heart attack Brother    Heart  disease Brother    High blood pressure Brother       VITAL SIGNS BP 130/80   Pulse 64   Temp 97.7 F (36.5 C)   Resp 20   Ht 5' 5.5 (1.664 m)   Wt 189 lb 6.4 oz (85.9 kg)   SpO2 94%   BMI 31.04 kg/m   Outpatient Encounter Medications as of 03/25/2024  Medication Sig   amLODipine  (NORVASC ) 2.5 MG tablet Take 2.5 mg by mouth daily.   cyanocobalamin  (VITAMIN B12)  1000 MCG tablet Take 1 tablet (1,000 mcg total) by mouth daily.   ezetimibe  (ZETIA ) 10 MG tablet Take 1 tablet (10 mg total) by mouth every morning.   gabapentin  (NEURONTIN ) 300 MG capsule Take 1 capsule (300 mg total) by mouth at bedtime.   latanoprost  (XALATAN ) 0.005 % ophthalmic solution Place 1 drop into both eyes at bedtime.   levothyroxine  (SYNTHROID ) 75 MCG tablet Take 1 tablet (75 mcg total) by mouth daily before breakfast.   meclizine  (ANTIVERT ) 12.5 MG tablet Take 1 tablet (12.5 mg total) by mouth 3 (three) times daily for 5 days, THEN 1 tablet (12.5 mg total) 3 (three) times daily as needed.   Nutritional Supplements (ENSURE HIGH PROTEIN) LIQD Take 1 Can by mouth daily.   nystatin  cream (MYCOSTATIN ) Apply 1 Application topically 2 (two) times daily. Apply to affected area on abdominal folds and peri-area   OVER THE COUNTER MEDICATION Take 1 capsule by mouth daily. OmegaXL joint and muscle support   potassium chloride  SA (KLOR-CON  M) 20 MEQ tablet 2 tablets in the morning, 1 tablet in the afternoon   pravastatin  (PRAVACHOL ) 80 MG tablet Take 1 tablet (80 mg total) by mouth at bedtime.   Psyllium 28.3 % POWD Follow Package Directions.   sertraline  (ZOLOFT ) 25 MG tablet Take 1 tablet (25 mg total) by mouth daily.   torsemide  (DEMADEX ) 20 MG tablet Take 2 tablets (40 mg total) by mouth daily.   No facility-administered encounter medications on file as of 03/25/2024.     SIGNIFICANT DIAGNOSTIC EXAMS  Review of Systems  Constitutional:  Negative for malaise/fatigue.  Respiratory:  Negative for cough and shortness of breath.   Cardiovascular:  Positive for leg swelling. Negative for chest pain and palpitations.  Gastrointestinal:  Negative for abdominal pain, constipation and heartburn.  Musculoskeletal:  Negative for back pain, joint pain and myalgias.  Skin: Negative.   Neurological:  Negative for dizziness.  Psychiatric/Behavioral:  The patient is not nervous/anxious.    Physical  Exam Constitutional:      General: She is not in acute distress.    Appearance: She is well-developed. She is not diaphoretic.  Neck:     Thyroid : No thyromegaly.  Cardiovascular:     Rate and Rhythm: Normal rate and regular rhythm.     Pulses: Normal pulses.     Heart sounds: Normal heart sounds.  Pulmonary:     Effort: Pulmonary effort is normal. No respiratory distress.     Breath sounds: Normal breath sounds.  Abdominal:     General: Bowel sounds are normal. There is no distension.     Palpations: Abdomen is soft.     Tenderness: There is no abdominal tenderness.  Musculoskeletal:        General: Normal range of motion.     Cervical back: Neck supple.     Right lower leg: Edema present.     Left lower leg: Edema present.  Lymphadenopathy:     Cervical: No cervical adenopathy.  Skin:    General: Skin is warm and dry.  Neurological:     Mental Status: She is alert and oriented to person, place, and time.  Psychiatric:        Mood and Affect: Mood normal.      ASSESSMENT/ PLAN:  TODAY  PAD (Peripheral arterial disease) Chronic diastolic CHF (congestive heart failure) Acquired hypothyroidism.   Will continue her current regimen She is up to date on her health maintenance She will need to return to clinic in 6 months.    Barnie Seip NP [facility]  call [phone]

## 2024-03-26 ENCOUNTER — Ambulatory Visit: Attending: Physician Assistant | Admitting: Physician Assistant

## 2024-03-26 ENCOUNTER — Encounter: Payer: Self-pay | Admitting: Physician Assistant

## 2024-03-26 VITALS — BP 116/69 | HR 64 | Ht 65.0 in | Wt 190.2 lb

## 2024-03-26 DIAGNOSIS — E785 Hyperlipidemia, unspecified: Secondary | ICD-10-CM | POA: Diagnosis not present

## 2024-03-26 DIAGNOSIS — I2510 Atherosclerotic heart disease of native coronary artery without angina pectoris: Secondary | ICD-10-CM

## 2024-03-26 DIAGNOSIS — I10 Essential (primary) hypertension: Secondary | ICD-10-CM

## 2024-03-26 DIAGNOSIS — I5032 Chronic diastolic (congestive) heart failure: Secondary | ICD-10-CM | POA: Diagnosis not present

## 2024-03-26 NOTE — Progress Notes (Signed)
 Cardiology Office Note   Date:  03/26/2024  ID:  Jill Preston, DOB 15-Dec-1932, MRN [id number] PCP: Leonarda Roxan BROCKS, NP  [facility] Providers Cardiologist:  Jerel Balding, MD     History of Present Illness Jill Preston is a 89 y.o. female with past medical history of chronic diastolic heart failure, CKD, sick sinus syndrome, hypertension, hyperlipidemia and CAD. Patient previously underwent left and right heart cath in May 2016 that showed 70% lesion in the left main, 60% lesion in the ostial left circumflex, 20 to 30% stenosis in proximal RCA. Surgical consultation was recommended for possible CABG. She underwent a coronary CTA with FFR in May 2016 that showed moderate distal left main stenosis was not flow-limiting. Patient again underwent left heart cath in July 2018 that showed 70% distal left main lesion, angiographically similar to 2016. FFR was 0.89 into LAD and 0.86 into the left circumflex artery suggesting the lesion was not flow-limiting. Patient had otherwise nonobstructive CAD and patent stent in the LAD. Echocardiogram in August 2021 showed EF 60 to 65%, no regional wall motion abnormality, grade 2 DD. Carotid ultrasound showed no significant carotid artery disease.   Patient was admitted in March 2025 with orthostatic hypotension and frequent fall. She was found to have a urinary tract infection and was given IV fluid. Due to low blood pressure, her Imdur , amlodipine  and torsemide  were held during the admission. On discharge, meds were resumed. It was suspected that her frequent falls are likely due to orthostatic hypotension in the setting of UTI. Echocardiogram obtained on 11/13/2023 showed EF 55 to 60%, grade 2 DD, normal RV function, mild to moderate aortic dilatation, mild MR. Patient was most recently seen by Dr. Balding on 01/07/2024 at which time she appears to be euvolemic. She was seen by Dr. Waddell on 01/28/2024 and mentions with Dr. Waddell that she has been  out of her torsemide  and gained some weight back. She had dyspnea on exertion. Therefore she was restarted on the torsemide  with the instruction of taking 40 mg of torsemide  on Tuesday and Friday and 20 mg daily all the other days.   I saw the patient on 03/16/2024, her weight was up after missing her diuretic for 4 days.  I instructed the patient to take 2 tablets of torsemide  for 2 days before going back to the previous regiment of 40 mg Tuesday and Friday and 20 mg all the other days.  Her weight did come down to 185 after the diuresis.  She continued to cough every time she tried to lay down.  She has 1+ pitting edema to bilateral lower extremity, I ultimately decided to increase her torsemide  to 40 mg daily.  I also decreased her potassium supplement to 40 mEq a.m. added 20 mEq p.m.[name]  Chest x-ray showed no acute finding.  Patient presents today for follow-up.  She continues to have shortness of breath and weight gain.  She never started on the 40 mg daily of torsemide , instead she is still taking torsemide  at 20 mg daily except for 40 mg every Tuesday and Friday.  I asked her to increase torsemide  to 40 mg daily.  During the last office visit, I also increased her potassium chloride  to 40 mEq a.m. and 20 mEq p.m.  Exam, she has trace amount of lower extremity edema.  She will need basic metabolic panel in 1 to 2 weeks.  I plan to see the patient back in 4 to 6 weeks.  ROS:  Patient complains of shortness of breath, abdominal bloating and also coughing when laying down.  She denies any chest pain.  Studies Reviewed      Cardiac Studies & Procedures   ______________________________________________________________________________________________ CARDIAC CATHETERIZATION  CARDIAC CATHETERIZATION 04/05/2017  Conclusion  LM lesion, 70 %stenosed.  Mid LAD lesion, 0 %stenosed at site of prior stent.  Ost Cx lesion, 60 %stenosed.  Prox RCA-1 lesion, 20 %stenosed.  Prox RCA-2 lesion, 30  %stenosed.  The left ventricular systolic function is normal.  LV end diastolic pressure is normal.  The left ventricular ejection fraction is 55-65% by visual estimate.  1. 70% eccentric distal left main stenosis. Angiographically similar to 2016. FFR 0.89 into the LAD and 0.86 into the LCx suggesting that this lesion is not flow limiting. 2. Otherwise nonobstructive CAD. Patent stent in LAD. 3. Normal LV function 4. Normal LVEDP  Plan: recommend continued medical therapy.  Findings Coronary Findings Diagnostic  Dominance: Right  Left Main The lesion is eccentric. The lesion is calcified. Pressure wire/FFR was performed on the lesion. FFR: 0.86.  Left Anterior Descending Mid LAD lesion with no stenosis was previously treated.  Left Circumflex  First Obtuse Marginal Branch  Right Coronary Artery  Intervention  No interventions have been documented.   CARDIAC CATHETERIZATION  CARDIAC CATHETERIZATION 01/27/2015  Conclusion  Ost Cx lesion, 60% stenosed.  LM lesion, 50% stenosed.  1. Moderate distal left main artery stenosis with fractional flow reserve assessment suggesting the lesion is not flow limiting. (FFR=0.91) 2. Moderate ostial Circumflex stenosis  Recommendations: Medical management of CAD. I would not pursue revascularization strategies at this time. Her chest pressure has been present continuously for months with no relief suggesting it is not cardiac related.  Findings Coronary Findings Diagnostic  Dominance: Right  Left Main discrete .   Pressure wire/FFR was performed on the lesion.  FFR: 0.91.  Left Anterior Descending Previously placed Mid LAD stent (unknown type) is patent.  Left Circumflex discrete .  Intervention  No interventions have been documented.   STRESS TESTS  MYOCARDIAL PERFUSION IMAGING 11/20/2016  Interpretation Summary  The left ventricular ejection fraction is normal (55-65%).  Nuclear stress EF: 62%.  T wave  inversion of 4 mm was noted during stress in the II, III, aVF, V6, V5 and V4 leads. T wave inversion persisted.  There was no ST segment deviation noted during stress.  This is a low risk study.  No reversible ischemia. RV uptake noted which could suggest elevated PA pressure. LVEF 62% with normal wall motion. This is a low risk study.   ECHOCARDIOGRAM  ECHOCARDIOGRAM COMPLETE 11/13/2023  Narrative ECHOCARDIOGRAM REPORT    Patient Name:   Jill Preston Date of Exam: 11/13/2023 Medical Rec #:  [id number]         Height:       65.0 in Accession #:    [id number]        Weight:       170.0 lb Date of Birth:  [birth year]        BSA:          1.846 m Patient Age:    89 years          BP:           111/57 mmHg Patient Gender: F                 HR:           59 bpm. Exam Location:  Inpatient  Procedure: 2D Echo, Color Doppler and Cardiac Doppler (Both Spectral and Color Flow Doppler were utilized during procedure).  Indications:    Elevated Troponins  History:        Patient has prior history of Echocardiogram examinations, most recent 04/22/2020. CHF, CAD; Risk Factors:Hypertension and Dyslipidemia.  Sonographer:    Damien Senior RDCS Referring Phys: [id number] DANIEL V THOMPSON  IMPRESSIONS   1. Left ventricular ejection fraction, by estimation, is 55 to 60%. The left ventricle has normal function. The left ventricle has no regional wall motion abnormalities. Left ventricular diastolic parameters are consistent with Grade II diastolic dysfunction (pseudonormalization). 2. Right ventricular systolic function is normal. The right ventricular size is normal. Tricuspid regurgitation signal is inadequate for assessing PA pressure. 3. Left atrial size was mild to moderately dilated. 4. The mitral valve is normal in structure. Mild mitral valve regurgitation. 5. The aortic valve is tricuspid. There is mild calcification of the aortic valve. Aortic valve regurgitation is not visualized. 6.  The inferior vena cava is normal in size with <50% respiratory variability, suggesting right atrial pressure of 8 mmHg.  FINDINGS Left Ventricle: Left ventricular ejection fraction, by estimation, is 55 to 60%. The left ventricle has normal function. The left ventricle has no regional wall motion abnormalities. Strain imaging was not performed. The left ventricular internal cavity size was normal in size. There is no left ventricular hypertrophy. Left ventricular diastolic parameters are consistent with Grade II diastolic dysfunction (pseudonormalization).  Right Ventricle: The right ventricular size is normal. No increase in right ventricular wall thickness. Right ventricular systolic function is normal. Tricuspid regurgitation signal is inadequate for assessing PA pressure.  Left Atrium: Left atrial size was mild to moderately dilated.  Right Atrium: Right atrial size was normal in size.  Pericardium: There is no evidence of pericardial effusion. Presence of epicardial fat layer.  Mitral Valve: The mitral valve is normal in structure. Mild mitral annular calcification. Mild mitral valve regurgitation.  Tricuspid Valve: The tricuspid valve is normal in structure. Tricuspid valve regurgitation is trivial.  Aortic Valve: The aortic valve is tricuspid. There is mild calcification of the aortic valve. Aortic valve regurgitation is not visualized.  Pulmonic Valve: The pulmonic valve was normal in structure. Pulmonic valve regurgitation is trivial.  Aorta: The aortic root and ascending aorta are structurally normal, with no evidence of dilitation.  Venous: The inferior vena cava is normal in size with less than 50% respiratory variability, suggesting right atrial pressure of 8 mmHg.  IAS/Shunts: No atrial level shunt detected by color flow Doppler.  Additional Comments: 3D imaging was not performed.   LEFT VENTRICLE PLAX 2D LVIDd:         4.40 cm   Diastology LVIDs:         3.00 cm   LV  e' medial:    7.46 cm/s LV PW:         0.70 cm   LV E/e' medial:  11.4 LV IVS:        0.80 cm   LV e' lateral:   8.08 cm/s LVOT diam:     1.90 cm   LV E/e' lateral: 10.5 LV SV:         86 LV SV Index:   47 LVOT Area:     2.84 cm   RIGHT VENTRICLE RV S prime:     10.70 cm/s TAPSE (M-mode): 1.8 cm  LEFT ATRIUM             Index  RIGHT ATRIUM           Index LA diam:        3.70 cm 2.00 cm/m   RA Area:     13.80 cm LA Vol (A2C):   81.6 ml 44.20 ml/m  RA Volume:   31.90 ml  17.28 ml/m LA Vol (A4C):   61.5 ml 33.31 ml/m LA Biplane Vol: 70.8 ml 38.35 ml/m AORTIC VALVE LVOT Vmax:   137.00 cm/s LVOT Vmean:  89.000 cm/s LVOT VTI:    0.305 m  AORTA Ao Root diam: 2.90 cm Ao Asc diam:  2.90 cm  MITRAL VALVE MV Area (PHT): 2.55 cm    SHUNTS MV Decel Time: 298 msec    Systemic VTI:  0.30 m MV E velocity: 85.20 cm/s  Systemic Diam: 1.90 cm MV A velocity: 46.30 cm/s MV E/A ratio:  1.84  Toribio Fuel MD Electronically signed by Toribio Fuel MD Signature Date/Time: 11/13/2023/2:50:30 PM    Final          ______________________________________________________________________________________________      Risk Assessment/Calculations           Physical Exam VS:  BP 116/69 (BP Location: Left Arm, Patient Position: Sitting, Cuff Size: Large)   Pulse 64   Ht 5' 5 (1.651 m)   Wt 190 lb 3.2 oz (86.3 kg)   SpO2 93%   BMI 31.65 kg/m        Wt Readings from Last 3 Encounters:  03/26/24 190 lb 3.2 oz (86.3 kg)  03/25/24 189 lb 6.4 oz (85.9 kg)  03/04/24 190 lb 3.2 oz (86.3 kg)    GEN: Well nourished, well developed in no acute distress NECK: No JVD; No carotid bruits CARDIAC: RRR, no murmurs, rubs, gallops RESPIRATORY:  Clear to auscultation without rales, wheezing or rhonchi  ABDOMEN: Soft, non-tender, non-distended EXTREMITIES:  No edema; No deformity   ASSESSMENT AND PLAN  Chronic diastolic heart failure: Patient is mildly volume overloaded.   During the last office visit, I instructed her to increase torsemide  to 40 mg daily and increase potassium supplement to 40 mEq a.m. and 20 mEq p.m., it appears she never increased torsemide  dosage, I have instructed her to increase the torsemide  again.  Will obtain basic metabolic panel in 1 to 2 weeks to make sure she is not dehydrated.  CAD: Denies any recent chest pain  Hypertension: Blood pressure well-controlled  Hyperlipidemia: On Zetia  and pravastatin .        Dispo: Follow-up in 4 to 6 weeks.  Signed, [name] Ford, PA

## 2024-03-26 NOTE — Patient Instructions (Signed)
 Medication Instructions:  TAKE TORSEMIDE  AND POTASSIUM AS INSTRUCTED AT PREVIOUS OFFICE VISIT *If you need a refill on your cardiac medications before your next appointment, please call your pharmacy*  Lab Work: BMET IN 1-2 WEEKS If you have labs (blood work) drawn today and your tests are completely normal, you will receive your results only by: MyChart Message (if you have MyChart) OR A paper copy in the mail If you have any lab test that is abnormal or we need to change your treatment, we will call you to review the results.  Testing/Procedures: NO TESTING  Follow-Up: At [facility], you and your health needs are our priority.  As part of our continuing mission to provide you with exceptional heart care, our providers are all part of one team.  This team includes your primary Cardiologist (physician) and Advanced Practice Providers or APPs (Physician Assistants and Nurse Practitioners) who all work together to provide you with the care you need, when you need it.  Your next appointment:   4-6 week(s)  Provider:   [name] Ford, PA

## 2024-03-27 ENCOUNTER — Telehealth: Payer: Self-pay | Admitting: Cardiovascular Disease

## 2024-03-27 NOTE — Telephone Encounter (Signed)
 Spoke with the patient who states that she was told yesterday to increase her torsemide  to 40 mg (2 tablets). She states that she does not feel good and she is holding on to fluid. Today is the first day she has taken 2 tablets. I advised the patient that it may take several days for her to notice a difference after increasing her dose. If she does not see a change over the weekend she will give us  a call back.

## 2024-03-27 NOTE — Telephone Encounter (Signed)
 Pt would like a c/b from a nurse she states at her last appt too many changes were made that she doesn't agree with. Please advise

## 2024-04-10 ENCOUNTER — Other Ambulatory Visit (HOSPITAL_COMMUNITY): Payer: Self-pay

## 2024-04-10 ENCOUNTER — Other Ambulatory Visit: Payer: Self-pay

## 2024-04-15 ENCOUNTER — Other Ambulatory Visit (HOSPITAL_COMMUNITY): Payer: Self-pay

## 2024-04-17 ENCOUNTER — Other Ambulatory Visit (HOSPITAL_COMMUNITY): Payer: Self-pay

## 2024-04-17 ENCOUNTER — Telehealth: Payer: Self-pay | Admitting: Cardiovascular Disease

## 2024-04-17 ENCOUNTER — Ambulatory Visit: Payer: Self-pay

## 2024-04-17 NOTE — Telephone Encounter (Signed)
 Pt c/o medication issue:  1. Name of Medication: levothyroxine  (SYNTHROID ) 75 MCG tablet   2. How are you currently taking this medication (dosage and times per day)? As written   3. Are you having a reaction (difficulty breathing--STAT)? No  4. What is your medication issue? Pt states this medication caused her to stay up all night and it caused her body to turn red all over.

## 2024-04-17 NOTE — Telephone Encounter (Signed)
 FYI Only or Action Required?: Action required by provider: update on patient condition.  Patient was last seen in primary care on 03/25/2024 by Landy Barnie RAMAN, NP.  Called Nurse Triage reporting Medication Reaction.  Symptoms began yesterday at 1pm after taking new bottle of levothyroxine  at 12:30.  Interventions attempted: OTC medications: Benadryl .  Symptoms are: whole body blood red rash (slowly returning to normal skin tone today with some small areas of discoloration still on arms), restlessness gradually improving with Benadryl .  Triage Disposition: See HCP Within 4 Hours (Or PCP Triage)  Patient/caregiver understands and will follow disposition?: No            Copied from CRM #[id number]. Topic: Clinical - Medical Advice >> Apr 17, 2024 11:01 AM Diannia H wrote: Reason for CRM: Patient got a new bottle on yesterday of her levothyroxine  (SYNTHROID ) 75 MCG tablet and about 30 minutes after taking it she started to developed redness in her skin. She checked her blood 154/72 with a 57 heart rate. This morning she checked it again and it was blood pressure was 121/65 and 65 heart rate. She is wanting to know if she needs to keep taking it, get a new bottle or what. Could you assist? Patients callback number is [phone]. Reason for Disposition  [1] Purple or blood-colored rash (spots or dots) AND [2] no fever AND [3] sounds well to triager  Answer Assessment - Initial Assessment Questions Advised patient to call pharmacist as well to make sure wrong medication was not given.  1. APPEARANCE of RASH: What does the rash look like? (e.g., spots, blisters, raised areas, skin peeling, scaly)     Blood red flat rash. Improved some after Benadryl . She states caused a shiny appearance on her legs.  2. SIZE: How big are the spots? (e.g., tip of pen, eraser, coin; inches, centimeters)     Small, like a pea.  3. LOCATION: Where is the rash located?     Whole body, even to  fingertips. Worse on arms and legs.  4. COLOR: What color is the rash? (Note: It is difficult to assess rash color in people with darker-colored skin. When this situation occurs, simply ask the caller to describe what they see.)     She states her arms still look blood red today, she states she is not quite back to her skin color but states there looks like brown spots or bruising on her arms.  5. ONSET: When did the rash begin?     30 minutes after taking her new bottle of levothyroxine  (12:30pm yesterday).  6. FEVER: Do you have a fever? If Yes, ask: What is your temperature, how was it measured, and when did it start?     No.  7. ITCHING: Does the rash itch? If Yes, ask: How bad is the itch? (Scale 1-10; or mild, moderate, severe)     Last night she states it was itchy but denies any itchiness today.  8. CAUSE: What do you think is causing the rash?     She thinks it was from her new bottle of levothyroxine .  9. NEW MEDICINES: What new medicines are you taking? (e.g., name of antibiotic) When did you start taking this medication?.     Started a new bottle of levothyroxine  yesterday.  10. OTHER SYMPTOMS: Do you have any other symptoms? (e.g., sore throat, fever, joint pain)       Patient states she couldn't lie still in bed, caused her to feel restless. Patient  denies difficulty breathing, throat/tongue/facial swelling, hoarseness, cough  11. PREGNANCY: Is there any chance you are pregnant? When was your last menstrual period?       N/A.  Protocols used: Rash - Widespread On Drugs-A-AH

## 2024-04-17 NOTE — Telephone Encounter (Signed)
 Jill Krabbe, RN to [facility]  (Selected Message)     04/17/24 12:06 PM Please review and advise- patient called her cardiologist and was advised to call PCP for advice. Patient can not come in today for visit as she doesn't drive and her family is out of town til tomorrow, no ride available. See triage note for symptoms. Advised patient to also call her pharmacist to confirm no recalls on medication and that correct medication was dispensed.

## 2024-04-17 NOTE — Telephone Encounter (Signed)
 Message routed to covering provider Jereld Delude, NP

## 2024-04-17 NOTE — Telephone Encounter (Signed)
 Spoke patient she stated she picked up Levothyroxine  75 mcg prescription yesterday.Stated 30 min after taking she turned red all over and felt nervous.Stated this has never happened.Advised she should call PCP.She stated she has a upcoming appointment with PA.She wants to change to a different PA.Appointment scheduled with Damien Braver NP 8/13 at 1:55 pm.

## 2024-04-19 NOTE — Telephone Encounter (Signed)
 Patient needs to be seen at [facility] or in urgent care to be assessed.

## 2024-04-20 ENCOUNTER — Other Ambulatory Visit (HOSPITAL_COMMUNITY): Payer: Self-pay

## 2024-04-20 ENCOUNTER — Ambulatory Visit (INDEPENDENT_AMBULATORY_CARE_PROVIDER_SITE_OTHER): Admitting: Orthopedic Surgery

## 2024-04-20 ENCOUNTER — Encounter: Payer: Self-pay | Admitting: Orthopedic Surgery

## 2024-04-20 ENCOUNTER — Ambulatory Visit: Admitting: Orthopedic Surgery

## 2024-04-20 VITALS — BP 124/60 | HR 66 | Temp 97.6°F | Resp 16 | Ht 65.0 in | Wt 184.0 lb

## 2024-04-20 DIAGNOSIS — R102 Pelvic and perineal pain: Secondary | ICD-10-CM

## 2024-04-20 DIAGNOSIS — B3731 Acute candidiasis of vulva and vagina: Secondary | ICD-10-CM

## 2024-04-20 DIAGNOSIS — N3941 Urge incontinence: Secondary | ICD-10-CM | POA: Diagnosis not present

## 2024-04-20 MED ORDER — FLUCONAZOLE 100 MG PO TABS
100.0000 mg | ORAL_TABLET | Freq: Two times a day (BID) | ORAL | 0 refills | Status: AC
  Filled 2024-04-20: qty 14, 7d supply, fill #0

## 2024-04-20 MED ORDER — NYSTATIN 100000 UNIT/GM EX CREA
1.0000 | TOPICAL_CREAM | Freq: Two times a day (BID) | CUTANEOUS | 2 refills | Status: DC
  Filled 2024-04-20: qty 30, 15d supply, fill #0
  Filled 2024-05-11: qty 30, 15d supply, fill #1
  Filled 2024-07-03: qty 30, 15d supply, fill #2

## 2024-04-20 NOTE — Progress Notes (Signed)
 Careteam: Patient Care Team: [name], Roxan BROCKS, NP as PCP - General (Family Medicine) Croitoru, Jerel, MD as PCP - Cardiology (Cardiology) Francyne Jerel, MD as Consulting Physician (Cardiology) Ivin Kocher, MD as Referring Physician (Dermatology) Gretel Ozell PARAS, DPM (Inactive) as Consulting Physician (Podiatry) [place], [name], OD (Optometry)  Seen by: Greig Cluster, AGNP-C  PLACE OF SERVICE:  [facility]  Advanced Directive information Does Patient Have a Medical Advance Directive?: Yes, Type of Advance Directive: Healthcare Power of [place];Living will, Does patient want to make changes to medical advance directive?: No - Patient declined  Allergies  Allergen Reactions   Atorvastatin Anaphylaxis and Other (See Comments)    Patient does not remember; caused pneumonia- took her off of it per patient   Penicillins Anaphylaxis, Hives, Swelling and Other (See Comments)    Tongue swelling Did it involve swelling of the face/tongue/throat, SOB, or low BP? Yes Did it involve sudden or severe rash/hives, skin peeling, or any reaction on the inside of your mouth or nose? Yes Did you need to seek medical attention at a hospital or doctor's office? Yes When did it last happen?  61 or 89 years old    If all above answers are NO, may proceed with cephalosporin use.    Shellfish Allergy Anaphylaxis, Nausea And Vomiting and Other (See Comments)    Severe nausea and vomiting   Aminophylline  Itching, Swelling, Rash and Other (See Comments)    Pt experienced burning urination, itching, redness/rash, and swelling of genitals after given this medication via IV push.   Iodinated Contrast Media Swelling and Other (See Comments)    FLUSHING, also; LLE became swollen   Latex Other (See Comments)    Causes blisters   Oxybutynin Chloride Other (See Comments)    blisters   Propoxyphene Other (See Comments)    Unknown reaction   Vancomycin  Hives and Other (See Comments)    06/02/2018- Hives  arm, back and chest   Adhesive [Tape] Rash and Other (See Comments)    Paper tape is ok   Betadine [Povidone Iodine] Rash   Ciprofloxacin Hives   Codeine  Nausea And Vomiting    .   Ranolazine  Other (See Comments)    Constipation  .    Chief Complaint  Patient presents with   Vaginal Bleeding    Patient complains of red discharge in vaginal area. She is concerned because mother had vaginal cancer.     HPI: Patient is a 89 y.o. female seen today for acute visit due to vaginal pain and bleeding.   Discussed the use of AI scribe software for clinical note transcription with the patient, who gave verbal consent to proceed.  History of Present Illness    She has been experiencing a rash in the groin area for approximately one month ago, which has extended to the vaginal folds. Her husband noticed 'two little sores' to inner vaginal folds. She is experiencing significant pain when sitting, especially with a pad on, and occasional burning during urination. No history of sexually transmitted infections and no similar symptoms in the past.  She underwent a complete hysterectomy in 1975 due to signs of cancer, during which all reproductive organs were removed. She has been using prescription nystatin  to help with yeast. She also has a history of a significant perineal scar from childbirth in 1965, which causes discomfort when sitting.  She experiences urinary incontinence, using a large number of pads daily. She also feels constantly tired and lacking energy, attributing this to  her age and caregiving responsibilities for her husband.  Her family history includes a mother who had cervical cancer.      Review of Systems:  Review of Systems  Constitutional: Negative.   Respiratory: Negative.    Cardiovascular: Negative.   Genitourinary:  Positive for dysuria. Negative for hematuria.  Psychiatric/Behavioral: Negative.      Past Medical History:  Diagnosis Date   Arthritis    right  knee   Cancer (HCC) yrs ago   skin cancer removed from face   Chronic diastolic CHF (congestive heart failure) (HCC)    Chronic venous insufficiency    LEA VENOUS, 10/17/2011 - mild reflux in bilateral common femoral veins   CKD (chronic kidney disease), stage III (HCC)    Coronary artery disease    a. s/p PCI/BMS to prox LAD and balloon angioplasty to mLAD with suboptimal result in 2000. b. Abnl nuc 2012, cath 09/2011 - showed that the overall territory of potential ischemia was small and attributable to a moderately diseased small second diagonal artery. Med rx.   Dyspnea    with activity   Headache    History of blood transfusion 2017   Hyperlipidemia    Hypertension    Hypertensive heart disease    Hypothyroidism    Obesity    Pneumonia    several times   Stroke [facility])    pt. states she had light stroke in sept. 1980   Urinary incontinence    Past Surgical History:  Procedure Laterality Date   ABDOMINAL HYSTERECTOMY  1970's   complete   ANKLE ARTHROSCOPY Right 07/10/2017   Procedure: ANKLE ARTHROSCOPY;  Surgeon: Gretel Ozell PARAS, DPM;  Location: [facility] OR;  Service: Podiatry;  Laterality: Right;   BACK SURGERY  07/26/2016   cervical neck surgery, [facility]   BALLOON DILATION N/A 12/01/2021   Procedure: BALLOON DILATION;  Surgeon: Rollin Dover, MD;  Location: [facility] ENDOSCOPY;  Service: Gastroenterology;  Laterality: N/A;   BLADDER SURGERY  2010   WITH MESH  bladder tach   bunion removal surgery Bilateral 15 yrs ago   CARDIAC CATHETERIZATION Left 09/25/2011   Medical management   CARDIAC CATHETERIZATION Left 03/25/2001   Normal LV function, LAD residual narrowing of less than 10%, normal ramus intermediate, circumflex, and RCA,    CARDIAC CATHETERIZATION  09/04/1999   LAD, 3x79mm Tetra stent resulting in a reduction of the 80% stenosis to 0% residual   CARDIAC CATHETERIZATION N/A 01/26/2015   Procedure: Right/Left Heart Cath and Coronary Angiography;  Surgeon: Debby DELENA Sor, MD;  Location: [facility] INVASIVE CV LAB;  Service: Cardiovascular;  Laterality: N/A;   CARDIAC CATHETERIZATION N/A 01/27/2015   Procedure: Intravascular Pressure Wire/FFR Study;  Surgeon: Lonni JONETTA Cash, MD;  Location: [facility] INVASIVE CV LAB;  Service: Cardiovascular;  Laterality: N/A;   CARDIAC CATHETERIZATION N/A 01/27/2015   Procedure: Right Heart Cath;  Surgeon: Lonni JONETTA Cash, MD;  Location: [facility] INVASIVE CV LAB;  Service: Cardiovascular;  Laterality: N/A;   CHOLECYSTECTOMY     COLONOSCOPY WITH PROPOFOL  N/A 03/07/2017   Procedure: COLONOSCOPY WITH PROPOFOL ;  Surgeon: Kristie Lamprey, MD;  Location: [facility];  Service: Endoscopy;  Laterality: N/A;   CORONARY PRESSURE/FFR STUDY N/A 04/05/2017   Procedure: Intravascular Pressure Wire/FFR Study;  Surgeon: Swaziland, Peter M, MD;  Location: [facility] INVASIVE CV LAB;  Service: Cardiovascular;  Laterality: N/A;   CORONARY STENT PLACEMENT     LAD x 1   ESOPHAGOGASTRODUODENOSCOPY (EGD) WITH PROPOFOL  N/A 12/01/2021   Procedure: ESOPHAGOGASTRODUODENOSCOPY (  EGD) WITH PROPOFOL ;  Surgeon: Rollin Dover, MD;  Location: [facility];  Service: Gastroenterology;  Laterality: N/A;   ganglion cyst removal  yrs ago   x 2   ILIAC VEIN ANGIOPLASTY / STENTING  02/15/2015   IVC FILTER INSERTION  2016   IVC FILTER REMOVAL  02/15/2015   at [facility]   LEFT HEART CATH AND CORONARY ANGIOGRAPHY N/A 04/05/2017   Procedure: Left Heart Cath and Coronary Angiography;  Surgeon: Swaziland, Peter M, MD;  Location: [facility] INVASIVE CV LAB;  Service: Cardiovascular;  Laterality: N/A;   LEFT HEART CATHETERIZATION WITH CORONARY ANGIOGRAM N/A 09/25/2011   Procedure: LEFT HEART CATHETERIZATION WITH CORONARY ANGIOGRAM;  Surgeon: Jerel Balding, MD;  Location: [facility] CATH LAB;  Service: Cardiovascular;  Laterality: N/A;   multiple bladder surgeries to remove mesh     ROTATOR CUFF REPAIR Right    stent to groin Left 08/2014   left leg   TENDON REPAIR Right 07/10/2017   Procedure: RIGHT PERONEAL  TENDON REPAIR;  Surgeon: Gretel Ozell PARAS, DPM;  Location: [facility] OR;  Service: Podiatry;  Laterality: Right;   TENDON REPAIR Right 05/27/2019   Procedure: PERONEAL TENDON REPAIR x2;  Surgeon: Gretel Ozell PARAS, DPM;  Location: [facility] ORS;  Service: Podiatry;  Laterality: Right;   Social History:   reports that she has never smoked. She has never used smokeless tobacco. She reports that she does not drink alcohol and does not use drugs.  Family History  Problem Relation Age of Onset   Cancer Mother    Heart disease Father    Heart disease Sister    High blood pressure Sister    Heart disease Sister    Cancer Sister    Heart disease Brother    Diabetes Brother    Brain cancer Brother    High blood pressure Brother    Heart disease Brother    Heart attack Brother    Heart disease Brother    High blood pressure Brother     Medications: Patient's Medications  New Prescriptions   FLUCONAZOLE  (DIFLUCAN ) 100 MG TABLET    Take 1 tablet (100 mg total) by mouth 2 (two) times daily for 7 days.   NYSTATIN  CREAM (MYCOSTATIN )    Apply 1 Application topically 2 (two) times daily. Apply to vaginal and groin folds twice daily until resolved.  Previous Medications   AMLODIPINE  (NORVASC ) 2.5 MG TABLET    Take 2.5 mg by mouth daily.   CYANOCOBALAMIN  (VITAMIN B12) 1000 MCG TABLET    Take 1 tablet (1,000 mcg total) by mouth daily.   EZETIMIBE  (ZETIA ) 10 MG TABLET    Take 1 tablet (10 mg total) by mouth every morning.   GABAPENTIN  (NEURONTIN ) 300 MG CAPSULE    Take 1 capsule (300 mg total) by mouth at bedtime.   LATANOPROST  (XALATAN ) 0.005 % OPHTHALMIC SOLUTION    Place 1 drop into both eyes at bedtime.   LEVOTHYROXINE  (SYNTHROID ) 75 MCG TABLET    Take 1 tablet (75 mcg total) by mouth daily before breakfast.   MECLIZINE  (ANTIVERT ) 12.5 MG TABLET    Take 1 tablet (12.5 mg total) by mouth 3 (three) times daily for 5 days, THEN 1 tablet (12.5 mg total) 3 (three) times daily as needed.   NUTRITIONAL SUPPLEMENTS  (ENSURE HIGH PROTEIN) LIQD    Take 1 Can by mouth daily.   NYSTATIN  CREAM (MYCOSTATIN )    Apply 1 Application topically 2 (two) times daily. Apply to affected area on abdominal folds and peri-area  OVER THE COUNTER MEDICATION    Take 1 capsule by mouth daily. OmegaXL joint and muscle support   POTASSIUM CHLORIDE  SA (KLOR-CON  M) 20 MEQ TABLET    2 tablets in the morning, 1 tablet in the afternoon   PRAVASTATIN  (PRAVACHOL ) 80 MG TABLET    Take 1 tablet (80 mg total) by mouth at bedtime.   PSYLLIUM 28.3 % POWD    Follow Package Directions.   SERTRALINE  (ZOLOFT ) 25 MG TABLET    Take 1 tablet (25 mg total) by mouth daily.   TORSEMIDE  (DEMADEX ) 20 MG TABLET    Take 2 tablets (40 mg total) by mouth daily.  Modified Medications   No medications on file  Discontinued Medications   No medications on file    Physical Exam:  Vitals:   04/20/24 1246  BP: 124/60  Pulse: 66  Resp: 16  Temp: 97.6 F (36.4 C)  SpO2: 92%  Weight: 184 lb (83.5 kg)  Height: 5' 5 (1.651 m)   Body mass index is 30.62 kg/m. Wt Readings from Last 3 Encounters:  04/20/24 184 lb (83.5 kg)  03/26/24 190 lb 3.2 oz (86.3 kg)  03/25/24 189 lb 6.4 oz (85.9 kg)    Physical Exam Vitals reviewed. Exam conducted with a chaperone present.  HENT:     Head: Normocephalic.  Eyes:     General:        Right eye: No discharge.        Left eye: No discharge.  Cardiovascular:     Rate and Rhythm: Normal rate and regular rhythm.     Pulses: Normal pulses.     Heart sounds: Normal heart sounds.  Pulmonary:     Effort: Pulmonary effort is normal.     Breath sounds: Normal breath sounds.  Abdominal:     Palpations: Abdomen is soft.  Genitourinary:    Labia:        Right: Tenderness and lesion present. No rash.        Left: Tenderness and lesion present. No rash.      Comments: Excoriated skin to inner vaginal folds, R>L, no discharge, trace blood, mild tenderness during exam Musculoskeletal:        General: Normal range  of motion.     Cervical back: Neck supple.  Skin:    General: Skin is warm.     Capillary Refill: Capillary refill takes less than 2 seconds.     Comments: Excoriated skin to groin folds, L>R  Neurological:     General: No focal deficit present.     Mental Status: She is alert and oriented to person, place, and time.     Gait: Gait abnormal.  Psychiatric:        Mood and Affect: Mood normal.     Labs reviewed: Basic Metabolic Panel: Recent Labs    04/23/23 1125 05/14/23 1102 09/20/23 1118 10/04/23 0933 11/15/23 0738 11/16/23 1025 11/19/23 1117 02/25/24 1055 03/04/24 1117 03/11/24 0947  NA  --    < > 143   < > 135 138 141 141 147* 144  K  --    < > 3.2*   < > 4.1 4.7 4.3 4.1 4.4 4.0  CL  --    < > 101   < > 95* 98 103 103 107* 101  CO2  --    < > 34*   < > 29 30 31 24 25 26   GLUCOSE  --    < > 102   < >  139* 204* 116 144* 125* 136*  BUN  --    < > 18   < > 18 18 20 15 15 15   CREATININE  --    < > 0.94   < > 0.83 0.86 0.99* 0.89 0.90 0.93  CALCIUM  --    < > 9.0   < > 9.1 9.5 9.1 9.2 9.1 9.2  MG  --   --   --    < > 1.7 1.7 1.8  --   --   --   TSH 0.61  --  1.44  --   --   --   --   --   --   --    < > = values in this interval not displayed.   Liver Function Tests: Recent Labs    11/13/23 0651 11/14/23 0703 11/15/23 0738 11/19/23 1117  AST 60* 50* 47* 42*  ALT 28 28 28 26   ALKPHOS 59 66 66  --   BILITOT 0.9 0.6 0.6 0.9  PROT 5.0* 4.9* 5.3* 5.3*  ALBUMIN  2.2* 2.1* 2.2*  --    No results for input(s): LIPASE, AMYLASE in the last 8760 hours. Recent Labs    11/15/23 1314  AMMONIA 27   CBC: Recent Labs    09/20/23 1118 11/12/23 1407 11/13/23 0651 11/15/23 0738 11/16/23 0840 11/19/23 1117  WBC 5.0 5.0   < > 4.5 4.7 5.9  NEUTROABS 3,365 3.6  --   --   --  4,289  HGB 14.4 15.1*   < > 14.0 14.3 14.1  HCT 43.6 43.6   < > 41.6 42.1 42.2  MCV 102.1* 96.0   < > 100.5* 99.5 100.7*  PLT 100* 56*   < > 71* 85* 175   < > = values in this interval not  displayed.   Lipid Panel: Recent Labs    09/20/23 1118  CHOL 122  HDL 63  LDLCALC 45  TRIG 59  CHOLHDL 1.9   TSH: Recent Labs    04/23/23 1125 09/20/23 1118  TSH 0.61 1.44   A1C: Lab Results  Component Value Date   HGBA1C 6.1 (H) 01/26/2015     Assessment/Plan 1. Vaginal pain (Primary) - onset 1 month ago - inner vaginal folds appear excoriated, no discharge, trace blood and tenderness - ? Candida versus vaginal atrophy - will try oral fluconazole  and nystatin  cream applications - recommend follow up in 1 week - fluconazole  (DIFLUCAN ) 100 MG tablet; Take 1 tablet (100 mg total) by mouth 2 (two) times daily for 7 days.  Dispense: 14 tablet; Refill: 0 - nystatin  cream (MYCOSTATIN ); Apply 1 Application topically 2 (two) times daily. Apply to vaginal and groin folds twice daily until resolved.  Dispense: 30 g; Refill: 2  2. Yeast infection of the vagina - see above  3. Urge urinary incontinence - uses pads - discussed frequent pad changes  Total time: 26 minutes. Greater than 50% of total time spent doing patient education regarding vaginal pain, yeast infection and incontinence including symptom/medication management.     Next appt: Visit date not found  Welford Christmas Gil BODILY  [facility] [phone]

## 2024-04-20 NOTE — Telephone Encounter (Signed)
 I called patient this morning and she states that's she took her blood pressure this morning it was 105/47 with 67 heart rate, then she checked again it was 108/62 with 62 heart rate, and one last time this morning 131/72 with 65 heart rate. She states that she feels much better now and doesn't need to be seen in the office for that. However she states that she's been experiencing some red vaginal discharge. She states that her mother had cervical cancer and she just wants someone to take a look and make sure that everything is fine.I have added patient to schedule with Greig Cluster, NP today 04/20/2024 at 3:20pm.

## 2024-04-20 NOTE — Patient Instructions (Addendum)
 Suspect area caused by yeast  Apply nystatin  cream twice daily to groin and vaginal folds  Take oral fluconazole  twice daily x 7 days  Follow up with Dinah in 1 week

## 2024-04-28 ENCOUNTER — Ambulatory Visit: Admitting: Physician Assistant

## 2024-04-29 ENCOUNTER — Telehealth: Payer: Self-pay

## 2024-04-29 ENCOUNTER — Encounter: Payer: Self-pay | Admitting: Nurse Practitioner

## 2024-04-29 ENCOUNTER — Ambulatory Visit (INDEPENDENT_AMBULATORY_CARE_PROVIDER_SITE_OTHER): Admitting: Family

## 2024-04-29 ENCOUNTER — Encounter: Payer: Self-pay | Admitting: Family

## 2024-04-29 ENCOUNTER — Ambulatory Visit: Attending: Nurse Practitioner | Admitting: Cardiology

## 2024-04-29 VITALS — BP 128/76 | HR 73 | Ht 65.5 in | Wt 182.0 lb

## 2024-04-29 VITALS — BP 122/66 | HR 69 | Temp 97.8°F | Resp 18 | Ht 65.0 in | Wt 182.6 lb

## 2024-04-29 DIAGNOSIS — N182 Chronic kidney disease, stage 2 (mild): Secondary | ICD-10-CM | POA: Diagnosis not present

## 2024-04-29 DIAGNOSIS — I2510 Atherosclerotic heart disease of native coronary artery without angina pectoris: Secondary | ICD-10-CM

## 2024-04-29 DIAGNOSIS — M65311 Trigger thumb, right thumb: Secondary | ICD-10-CM | POA: Diagnosis not present

## 2024-04-29 DIAGNOSIS — B3731 Acute candidiasis of vulva and vagina: Secondary | ICD-10-CM

## 2024-04-29 DIAGNOSIS — I5032 Chronic diastolic (congestive) heart failure: Secondary | ICD-10-CM

## 2024-04-29 DIAGNOSIS — E782 Mixed hyperlipidemia: Secondary | ICD-10-CM | POA: Diagnosis not present

## 2024-04-29 DIAGNOSIS — I10 Essential (primary) hypertension: Secondary | ICD-10-CM

## 2024-04-29 NOTE — Progress Notes (Signed)
 Provider: Roxan Plough FNP-C  Angelyn Osterberg, Roxan BROCKS, NP  Patient Care Team: Judea Riches, Roxan BROCKS, NP as PCP - General (Family Medicine) Croitoru, Jerel, MD as PCP - Cardiology (Cardiology) Francyne Jerel, MD as Consulting Physician (Cardiology) Ivin Kocher, MD as Referring Physician (Dermatology) Gretel Ozell PARAS, DPM (Inactive) as Consulting Physician (Podiatry) [place], [name], OD [facility])  Extended Emergency Contact Information Primary Emergency Contact: Donzetta Ezzard Danya [place], [place] Home Phone: [phone] Mobile Phone: [phone] Relation: Son Secondary Emergency Contact: [facility] Phone: [phone] Mobile Phone: [phone] Relation: Friend  Code Status:  Full Code  Goals of care: Advanced Directive information    04/20/2024   12:58 PM  Advanced Directives  Does Patient Have a Medical Advance Directive? Yes  Type of Estate agent of [place];Living will  Does patient want to make changes to medical advance directive? No - Patient declined  Copy of Healthcare Power of Attorney in Chart? No - copy requested     Chief Complaint  Patient presents with   Follow-up    1 Week follow up    Discussed the use of AI scribe software for clinical note transcription with the patient, who gave verbal consent to proceed.  History of Present Illness   Jill Preston is an 89 year old female who presents with persistent vaginal yeast infection and fluid retention.  She is following up one week after a previous visit where she was treated for a vaginal yeast infection. Symptoms persist despite treatment with Diflucan , taken twice daily for seven days, and nystatin  cream, applied twice daily to the groin and vaginal area. She continues to use the cream every morning and night. Her skin was breaking, and the cream has been more helpful than anything else.  She experiences fluid retention, which has been  difficult to manage. She coughs all night and day, particularly when lying down, and uses a long, soft pillow to elevate her head. Her legs are swollen, with bruising and breaking out, as reported by the patient. She monitors her weight closely, noting fluctuations between 179 and 187 pounds, and adjusts her diuretic medication accordingly. She was previously taking one diuretic pill at night and one in the morning, but her regimen was changed to one pill every morning and two on Tuesdays and Fridays, with none at night. This change led to an increase in her weight, prompting her to take an extra pill when her feet swell.  She has a history of a fall in February last year, resulting in a tear in her hand and ongoing issues with her thumb, described as a 'trigger finger'. This condition affects her ability to use her phone. She has not seen an orthopedic specialist for this issue yet.  Her social history includes her husband, who has Alzheimer's disease and is declining rapidly. She provides care for him, ensuring he drinks protein supplements three times a day and encouraging him to eat small, frequent meals. He enjoys sweets and snacks like peanut butter crackers and oatmeal cookies. She also engages him in activities like puzzles and card games to stimulate his mind.   Past Medical History:  Diagnosis Date   Arthritis    right knee   Cancer (HCC) yrs ago   skin cancer removed from face   Chronic diastolic CHF (congestive heart failure) (HCC)    Chronic venous insufficiency    LEA VENOUS, 10/17/2011 - mild reflux in bilateral common  femoral veins   CKD (chronic kidney disease), stage III (HCC)    Coronary artery disease    a. s/p PCI/BMS to prox LAD and balloon angioplasty to mLAD with suboptimal result in 2000. b. Abnl nuc 2012, cath 09/2011 - showed that the overall territory of potential ischemia was small and attributable to a moderately diseased small second diagonal artery. Med rx.   Dyspnea     with activity   Headache    History of blood transfusion 2017   Hyperlipidemia    Hypertension    Hypertensive heart disease    Hypothyroidism    Obesity    Pneumonia    several times   Stroke [facility])    pt. states she had light stroke in sept. 1980   Urinary incontinence    Past Surgical History:  Procedure Laterality Date   ABDOMINAL HYSTERECTOMY  1970's   complete   ANKLE ARTHROSCOPY Right 07/10/2017   Procedure: ANKLE ARTHROSCOPY;  Surgeon: Gretel Ozell PARAS, DPM;  Location: [facility] OR;  Service: Podiatry;  Laterality: Right;   BACK SURGERY  07/26/2016   cervical neck surgery, [facility]   BALLOON DILATION N/A 12/01/2021   Procedure: BALLOON DILATION;  Surgeon: Rollin Dover, MD;  Location: [facility] ENDOSCOPY;  Service: Gastroenterology;  Laterality: N/A;   BLADDER SURGERY  2010   WITH MESH  bladder tach   bunion removal surgery Bilateral 15 yrs ago   CARDIAC CATHETERIZATION Left 09/25/2011   Medical management   CARDIAC CATHETERIZATION Left 03/25/2001   Normal LV function, LAD residual narrowing of less than 10%, normal ramus intermediate, circumflex, and RCA,    CARDIAC CATHETERIZATION  09/04/1999   LAD, 3x69mm Tetra stent resulting in a reduction of the 80% stenosis to 0% residual   CARDIAC CATHETERIZATION N/A 01/26/2015   Procedure: Right/Left Heart Cath and Coronary Angiography;  Surgeon: Debby DELENA Sor, MD;  Location: [facility] INVASIVE CV LAB;  Service: Cardiovascular;  Laterality: N/A;   CARDIAC CATHETERIZATION N/A 01/27/2015   Procedure: Intravascular Pressure Wire/FFR Study;  Surgeon: Lonni JONETTA Cash, MD;  Location: [facility] INVASIVE CV LAB;  Service: Cardiovascular;  Laterality: N/A;   CARDIAC CATHETERIZATION N/A 01/27/2015   Procedure: Right Heart Cath;  Surgeon: Lonni JONETTA Cash, MD;  Location[facility] INVASIVE CV LAB;  Service: Cardiovascular;  Laterality: N/A;   CHOLECYSTECTOMY     COLONOSCOPY WITH PROPOFOL  N/A 03/07/2017   Procedure: COLONOSCOPY WITH PROPOFOL ;   Surgeon: Kristie Lamprey, MD;  Location: [facility];  Service: Endoscopy;  Laterality: N/A;   CORONARY PRESSURE/FFR STUDY N/A 04/05/2017   Procedure: Intravascular Pressure Wire/FFR Study;  Surgeon: Swaziland, Peter M, MD;  Location: [facility] INVASIVE CV LAB;  Service: Cardiovascular;  Laterality: N/A;   CORONARY STENT PLACEMENT     LAD x 1   ESOPHAGOGASTRODUODENOSCOPY (EGD) WITH PROPOFOL  N/A 12/01/2021   Procedure: ESOPHAGOGASTRODUODENOSCOPY (EGD) WITH PROPOFOL ;  Surgeon: Rollin Dover, MD;  Location: [facility];  Service: Gastroenterology;  Laterality: N/A;   ganglion cyst removal  yrs ago   x 2   ILIAC VEIN ANGIOPLASTY / STENTING  02/15/2015   IVC FILTER INSERTION  2016   IVC FILTER REMOVAL  02/15/2015   at [facility]   LEFT HEART CATH AND CORONARY ANGIOGRAPHY N/A 04/05/2017   Procedure: Left Heart Cath and Coronary Angiography;  Surgeon: Swaziland, Peter M, MD;  Location: [facility] INVASIVE CV LAB;  Service: Cardiovascular;  Laterality: N/A;   LEFT HEART CATHETERIZATION WITH CORONARY ANGIOGRAM N/A 09/25/2011   Procedure: LEFT HEART CATHETERIZATION WITH CORONARY ANGIOGRAM;  Surgeon:  Jerel Balding, MD;  Location: [facility] CATH LAB;  Service: Cardiovascular;  Laterality: N/A;   multiple bladder surgeries to remove mesh     ROTATOR CUFF REPAIR Right    stent to groin Left 08/2014   left leg   TENDON REPAIR Right 07/10/2017   Procedure: RIGHT PERONEAL TENDON REPAIR;  Surgeon: Gretel Ozell PARAS, DPM;  Location: [facility] OR;  Service: Podiatry;  Laterality: Right;   TENDON REPAIR Right 05/27/2019   Procedure: PERONEAL TENDON REPAIR x2;  Surgeon: Gretel Ozell PARAS, DPM;  Location: [facility] ORS;  Service: Podiatry;  Laterality: Right;    Allergies  Allergen Reactions   Atorvastatin Anaphylaxis and Other (See Comments)    Patient does not remember; caused pneumonia- took her off of it per patient   Penicillins Anaphylaxis, Hives, Swelling and Other (See Comments)    Tongue swelling Did it involve swelling of the face/tongue/throat, SOB,  or low BP? Yes Did it involve sudden or severe rash/hives, skin peeling, or any reaction on the inside of your mouth or nose? Yes Did you need to seek medical attention at a hospital or doctor's office? Yes When did it last happen?  59 or 89 years old    If all above answers are NO, may proceed with cephalosporin use.    Shellfish Allergy Anaphylaxis, Nausea And Vomiting and Other (See Comments)    Severe nausea and vomiting   Aminophylline  Itching, Swelling, Rash and Other (See Comments)    Pt experienced burning urination, itching, redness/rash, and swelling of genitals after given this medication via IV push.   Iodinated Contrast Media Swelling and Other (See Comments)    FLUSHING, also; LLE became swollen   Latex Other (See Comments)    Causes blisters   Oxybutynin Chloride Other (See Comments)    blisters   Propoxyphene Other (See Comments)    Unknown reaction   Vancomycin  Hives and Other (See Comments)    06/02/2018- Hives arm, back and chest   Adhesive [Tape] Rash and Other (See Comments)    Paper tape is ok   Betadine [Povidone Iodine] Rash   Ciprofloxacin Hives   Codeine  Nausea And Vomiting    .   Ranolazine  Other (See Comments)    Constipation  .    Outpatient Encounter Medications as of 04/29/2024  Medication Sig   amLODipine  (NORVASC ) 2.5 MG tablet Take 2.5 mg by mouth daily.   cyanocobalamin  (VITAMIN B12) 1000 MCG tablet Take 1 tablet (1,000 mcg total) by mouth daily.   ezetimibe  (ZETIA ) 10 MG tablet Take 1 tablet (10 mg total) by mouth every morning.   fluconazole  (DIFLUCAN ) 100 MG tablet Take 1 tablet (100 mg total) by mouth 2 (two) times daily for 7 days.   gabapentin  (NEURONTIN ) 300 MG capsule Take 1 capsule (300 mg total) by mouth at bedtime.   latanoprost  (XALATAN ) 0.005 % ophthalmic solution Place 1 drop into both eyes at bedtime.   levothyroxine  (SYNTHROID ) 75 MCG tablet Take 1 tablet (75 mcg total) by mouth daily before breakfast.   Nutritional  Supplements (ENSURE HIGH PROTEIN) LIQD Take 1 Can by mouth daily.   nystatin  cream (MYCOSTATIN ) Apply 1 Application topically 2 (two) times daily. Apply to affected area on abdominal folds and peri-area   nystatin  cream (MYCOSTATIN ) Apply 1 Application topically 2 (two) times daily. Apply to vaginal and groin folds twice daily until resolved.   OVER THE COUNTER MEDICATION Take 1 capsule by mouth daily. OmegaXL joint and muscle support   potassium chloride  SA (KLOR-CON   M) 20 MEQ tablet 2 tablets in the morning, 1 tablet in the afternoon   pravastatin  (PRAVACHOL ) 80 MG tablet Take 1 tablet (80 mg total) by mouth at bedtime.   Psyllium 28.3 % POWD Follow Package Directions.   sertraline  (ZOLOFT ) 25 MG tablet Take 1 tablet (25 mg total) by mouth daily.   torsemide  (DEMADEX ) 20 MG tablet Take 2 tablets (40 mg total) by mouth daily.   [DISCONTINUED] meclizine  (ANTIVERT ) 12.5 MG tablet Take 1 tablet (12.5 mg total) by mouth 3 (three) times daily for 5 days, THEN 1 tablet (12.5 mg total) 3 (three) times daily as needed. (Patient not taking: No sig reported)   No facility-administered encounter medications on file as of 04/29/2024.    Review of Systems  Constitutional:  Negative for appetite change, chills, fatigue, fever and unexpected weight change.  Eyes:  Negative for pain, discharge, redness, itching and visual disturbance.  Respiratory:  Negative for cough, chest tightness, shortness of breath and wheezing.   Cardiovascular:  Negative for chest pain, palpitations and leg swelling.  Gastrointestinal:  Negative for abdominal distention, abdominal pain, blood in stool, constipation, diarrhea, nausea and vomiting.  Endocrine: Negative for cold intolerance, heat intolerance, polydipsia, polyphagia and polyuria.  Genitourinary:  Negative for difficulty urinating, dysuria, flank pain, frequency and urgency.  Musculoskeletal:  Negative for arthralgias, back pain, gait problem, joint swelling, myalgias,  neck pain and neck stiffness.  Skin:  Negative for color change, pallor, rash and wound.       Skin fold redness has improved   Neurological:  Negative for dizziness, syncope, speech difficulty, weakness, light-headedness, numbness and headaches.  Hematological:  Does not bruise/bleed easily.  Psychiatric/Behavioral:  Negative for agitation, behavioral problems, confusion, hallucinations, self-injury, sleep disturbance and suicidal ideas. The patient is not nervous/anxious.     Immunization History  Administered Date(s) Administered   PFIZER(Purple Top)SARS-COV-2 Vaccination 11/11/2019, 11/13/2019, 12/08/2019   Pneumococcal Conjugate-13 06/14/2014   Pneumococcal Polysaccharide-23 10/26/2013, 03/30/2020   Tdap 12/19/2015, 11/12/2022   Pertinent  Health Maintenance Due  Topic Date Due   INFLUENZA VACCINE  12/15/2024 (Originally 04/17/2024)   DEXA SCAN  Completed      09/20/2023   10:31 AM 11/08/2023   12:49 PM 11/19/2023   10:31 AM 12/06/2023    9:46 AM 03/25/2024   10:54 AM  Fall Risk  Falls in the past year? 0 1 1 1  0  Was there an injury with Fall? 0 1 1 0 0  Fall Risk Category Calculator 0 3 2 1  0  Patient at Risk for Falls Due to  History of fall(s);Impaired balance/gait;Impaired mobility No Fall Risks No Fall Risks No Fall Risks  Fall risk Follow up  Falls evaluation completed Falls evaluation completed;Education provided;Falls prevention discussed Falls evaluation completed Falls evaluation completed   Functional Status Survey:    Vitals:   04/29/24 1024  BP: 122/66  Pulse: 69  Resp: 18  Temp: 97.8 F (36.6 C)  SpO2: 94%  Weight: 182 lb 9.6 oz (82.8 kg)  Height: 5' 5 (1.651 m)   Body mass index is 30.39 kg/m. Physical Exam  VITALS: T- 97.8, P- 69, BP- 122/66, SaO2- 94% MEASUREMENTS: Weight- 182.6. GENERAL: Alert, cooperative, well developed, no acute distress. HEENT: Normocephalic, normal oropharynx, moist mucous membranes. CHEST: Clear to auscultation bilaterally,  no wheezes, rhonchi, or crackles. CARDIOVASCULAR: Normal heart rate and rhythm, S1 and S2 normal without murmurs. ABDOMEN: Soft, non-tender, non-distended, without organomegaly, normal bowel sounds. EXTREMITIES: No cyanosis or edema. NEUROLOGICAL: Cranial nerves  grossly intact, moves all extremities without gross motor or sensory deficit.  SKIN: No rash,no lesion or erythema   PSYCHIATRY/BEHAVIORAL: Mood stable    Labs reviewed: Recent Labs    11/15/23 0738 11/16/23 1025 11/19/23 1117 02/25/24 1055 03/04/24 1117 03/11/24 0947  NA 135 138 141 141 147* 144  K 4.1 4.7 4.3 4.1 4.4 4.0  CL 95* 98 103 103 107* 101  CO2 29 30 31 24 25 26   GLUCOSE 139* 204* 116 144* 125* 136*  BUN 18 18 20 15 15 15   CREATININE 0.83 0.86 0.99* 0.89 0.90 0.93  CALCIUM 9.1 9.5 9.1 9.2 9.1 9.2  MG 1.7 1.7 1.8  --   --   --    Recent Labs    11/13/23 0651 11/14/23 0703 11/15/23 0738 11/19/23 1117  AST 60* 50* 47* 42*  ALT 28 28 28 26   ALKPHOS 59 66 66  --   BILITOT 0.9 0.6 0.6 0.9  PROT 5.0* 4.9* 5.3* 5.3*  ALBUMIN  2.2* 2.1* 2.2*  --    Recent Labs    09/20/23 1118 11/12/23 1407 11/13/23 0651 11/15/23 0738 11/16/23 0840 11/19/23 1117  WBC 5.0 5.0   < > 4.5 4.7 5.9  NEUTROABS 3,365 3.6  --   --   --  4,289  HGB 14.4 15.1*   < > 14.0 14.3 14.1  HCT 43.6 43.6   < > 41.6 42.1 42.2  MCV 102.1* 96.0   < > 100.5* 99.5 100.7*  PLT 100* 56*   < > 71* 85* 175   < > = values in this interval not displayed.   Lab Results  Component Value Date   TSH 1.44 09/20/2023   Lab Results  Component Value Date   HGBA1C 6.1 (H) 01/26/2015   Lab Results  Component Value Date   CHOL 122 09/20/2023   HDL 63 09/20/2023   LDLCALC 45 09/20/2023   TRIG 59 09/20/2023   CHOLHDL 1.9 09/20/2023    Significant Diagnostic Results in last 30 days:  No results found.  Assessment/Plan  Candidal intertrigo (groin and vaginal yeast infection) Persistent groin and vaginal yeast infection despite treatment with  Diflucan  and nystatin  cream. Symptoms have not cleared completely. - Continue nystatin  cream application twice daily - Ensure groin and vaginal area is kept dry - Consider another course of oral antifungal if symptoms worsen after a month, noting the risk of kidney strain with oral antifungals  Edema of lower extremities Chronic edema of lower extremities with associated cough, particularly when lying down, suggesting fluid retention. Weight fluctuations noted, with recent increase. - Monitor weight closely - Adjust diuretic dosage based on weight changes - Perform chair exercises to aid fluid management  Chronic bilateral knee pain Chronic bilateral knee pain with previous injury. Pain management includes topical gels. - Continue using topical pain relief gels - Perform exercises to maintain joint function  Trigger finger, right thumb Trigger finger in the right thumb causing pain and functional limitation. - Refer to orthopedic specialist for evaluation and potential cortisone injection  Alzheimer's disease (caregiver support) Caregiver support for spouse with Alzheimer's disease. Discussed strategies to encourage eating and drinking, including small frequent meals and protein supplements. - Encourage small frequent meals and protein supplements - Maintain hydration and nutrition - Continue caregiver support strategies   Family/ staff Communication: Reviewed plan of care with patient verbalized understanding   Labs/tests ordered: None   Next Appointment: Return if symptoms worsen or fail to improve.   Total time:  26 minutes. Greater than 50% of total time spent doing patient education regarding Candida,Leg edema,chronic knee pain,Right Thumb trigger finger,chronic back pain,health maintenance including symptom/medication management.   Roxan JAYSON Plough, NP

## 2024-04-29 NOTE — Telephone Encounter (Signed)
 Lmom to see if pt was taking OTC Asprin 81 mg. Pt was seen in office today by Thom Sluder. Waiting on a return call.

## 2024-04-29 NOTE — Progress Notes (Signed)
 Cardiology Office Note:  .   Date:  04/29/2024  ID:  Jill Preston, DOB 19-Apr-1933, MRN [id number] PCP: Jill Roxan BROCKS, NP  [facility] Providers Cardiologist:  Jill Balding, MD {  History of Present Illness: .   Jill Preston is a 89 y.o. female with history of CAD with remote bare-metal stent of LAD 2013, chronic HFpEF, hypertension, hyperlipidemia, PAD, DVT, chronic thrombocytopenia, hypothyroidism, CVA     CAD BMS to LAD 2013 Right left heart catheterization 01/2015 with MVD, focal calcified left main stenosis 70% with ostial circumflex encroachment, CABG was recommended.  However FFR was not flow-limiting in the left main.  Patent LAD stent.  Medical management recommended Left heart catheterization 03/2017 70% stenosis again in distal left main, unchanged from 2016.  Negative FFR.  Patent stent in LAD. EF normal 04/2020,10/2023.  Orthostatic hypotension Admitted 11/2023 for this and falls.  Found to have UTI.  Echo normal.  Felt to be a mixture of acute infection, possibly component of BPPV and started on meclizine . Imdur  previously stopped  Sick sinus syndrome I see this listed but I do not see any documentation from cardiology about this  Other Responded to Ranexa  but developed severe constipation Reportedly intolerant to beta-blockers due to bradycardia 08/2014 had acute left iliofemoral DVT complicated by retroperitoneal hematoma and hemorrhagic shock requiring IVC filter.  IVC filter removed 2016  Social history  She has a Engineer, structural. Husband has dementia.  Significantly influenced by this.     Patient with history of known left main disease, negative by FFR, that has been stable on cardiac catheterization with otherwise nonobstructive disease and patent LAD stent.  More recently she has been having issues with DOE in which her torsemide  dosing has been changed on multiple occasions the past 4+ months  Also had been reporting missing doses which  complicated things.  She initially was on torsemide  20 mg daily.  This was changed to  torsemide  20 mg twice daily.  This was then changed to 40 mg only on Tuesday and Friday with 20 mg the rest of the other days.  Then changed to 40 mg daily.  She was last seen 03/2024 reporting shortness of breath and weight gain.  She had not been taking torsemide  40 mg daily and was only taking the 40 mg tablet Tuesday Friday. Jill Preston had reiterated the plan for 40 mg daily.  She immediately called the office the day after her visit reporting that she did not agree with the changes and requested to be seen by a different provider.  In the interim she was seen by her PCP and being treated for yeast infection.  Today patient presents for follow-up.  She reports that she has been taking torsemide  20 mg twice daily which she feels is a stable regimen for her.  Sometimes she will reduce her nightly dose in half if she feels she does not need the full tablet.  She has been weighing herself every day and weights have been persistently around 180-182.  Not having any significant shortness of breath, orthopnea, or exertional dyspnea.  Not have any chest pain or significant fatigue.  She ambulates with a walker does okay with this.  Reports no falls.  No dizziness or orthostatic symptoms.  She has self discontinued her meclizine  as she no longer has had issues with dizziness.  Of note her son also helps her with her medications.   ROS: Denies: Chest pain, shortness of breath, orthopnea, peripheral edema, palpitations,  decreased exercise intolerance, fatigue, lightheadedness.   Studies Reviewed: .         Risk Assessment/Calculations:             Physical Exam:   VS:  BP 128/76   Pulse 73   Ht 5' 5.5 (1.664 m)   Wt 182 lb (82.6 kg)   SpO2 94%   BMI 29.83 kg/m    Wt Readings from Last 3 Encounters:  04/29/24 182 lb (82.6 kg)  04/29/24 182 lb 9.6 oz (82.8 kg)  04/20/24 184 lb (83.5 kg)    GEN: Well nourished, well  developed in no acute distress NECK: No JVD; No carotid bruits CARDIAC: RRR, no murmurs, rubs, gallops RESPIRATORY:  Clear to auscultation without rales, wheezing or rhonchi  ABDOMEN: Soft, non-tender, non-distended EXTREMITIES:  No edema; No deformity   ASSESSMENT AND PLAN: .    Chronic HFpEF - EF 55 to 60%, G2 DD, normal RV.  Mild MR For the past several months many adjustments have been made to her torsemide .  She has settled on torsemide  20 mg twice daily which I think is appropriate. She looks overall euvolemic on exam with only mild peripheral edema.  Lungs sound clear and has no JVD.  Weight has been stable and around 182 pounds. Continue with torsemide  20 mg twice daily.  Also taking potassium supplementation with this.  Sometimes she will alter her nightly dose and potassium accordingly. Very diligent about her weights, continue to encourage this.  She knows to call if she has significant weight gain. Avoid SGLT2 inhibitor with yeast infections and urinary issues.  In the future could consider spironolactone  but given all these acute adjustments would not make any changes now. I think dry weight is around 180-182.  CAD Hyperlipidemia She has known left main stenosis around 70% negative by FFR that has been stable on heart catheterization in 2016 and most recently 2018.  Also with patent LAD stent.  No anginal complaints or equivalents.  Stable disease. I do not see that she is on aspirin  on her med list but she should be, we will call and ensure that she is taking this.   Continue with Zetia , pravastatin  80 mg.  LDL is 45 09/2023.  Goal less than 55 but may be a little bit more liberal given advanced age.  Did not tolerate atorvastatin. Per Jill Preston can follow this with coronary CT angiography in the future if necessary.  Not felt to be bypass surgery candidate though.  Hypertension 128/76.   Continue amlodipine  2.5 mg  She also reported that she feels that part of her  intestines are slipping out.  Suspect that she has a cystocele something of the sort.  Recommended that she follow-up with PCP    Dispo: 67-month follow-up with Jill Preston to ensure she is maintaining euvolemia on current torsemide  dosing.  Signed, Thom LITTIE Sluder, PA-C

## 2024-04-29 NOTE — Patient Instructions (Signed)
 Medication Instructions:  Your physician recommends that you continue on your current medications as directed. Please refer to the Current Medication list given to you today.  *If you need a refill on your cardiac medications before your next appointment, please call your pharmacy*  Lab Work: NONE ordered at this time of appointment   Testing/Procedures: NONE ordered at this time of appointment   Follow-Up: At [facility], you and your health needs are our priority.  As part of our continuing mission to provide you with exceptional heart care, our providers are all part of one team.  This team includes your primary Cardiologist (physician) and Advanced Practice Providers or APPs (Physician Assistants and Nurse Practitioners) who all work together to provide you with the care you need, when you need it.  Your next appointment:   4 month(s)  Provider:   Jerel Balding, MD    We recommend signing up for the patient portal called MyChart.  Sign up information is provided on this After Visit Summary.  MyChart is used to connect with patients for Virtual Visits (Telemedicine).  Patients are able to view lab/test results, encounter notes, upcoming appointments, etc.  Non-urgent messages can be sent to your provider as well.   To learn more about what you can do with MyChart, go to [email or web address].

## 2024-05-08 DIAGNOSIS — M859 Disorder of bone density and structure, unspecified: Secondary | ICD-10-CM | POA: Diagnosis not present

## 2024-05-08 DIAGNOSIS — M810 Age-related osteoporosis without current pathological fracture: Secondary | ICD-10-CM | POA: Diagnosis not present

## 2024-05-11 ENCOUNTER — Other Ambulatory Visit (HOSPITAL_COMMUNITY): Payer: Self-pay

## 2024-05-11 ENCOUNTER — Other Ambulatory Visit: Payer: Self-pay | Admitting: Cardiovascular Disease

## 2024-05-11 ENCOUNTER — Other Ambulatory Visit: Payer: Self-pay

## 2024-05-11 MED FILL — Amlodipine Besylate Tab 2.5 MG (Base Equivalent): ORAL | 90 days supply | Qty: 90 | Fill #0 | Status: AC

## 2024-05-12 ENCOUNTER — Other Ambulatory Visit: Payer: Self-pay

## 2024-05-14 ENCOUNTER — Other Ambulatory Visit (HOSPITAL_COMMUNITY): Payer: Self-pay

## 2024-05-19 ENCOUNTER — Other Ambulatory Visit (HOSPITAL_COMMUNITY): Payer: Self-pay

## 2024-05-19 ENCOUNTER — Telehealth: Payer: Self-pay

## 2024-05-19 DIAGNOSIS — E2839 Other primary ovarian failure: Secondary | ICD-10-CM

## 2024-05-19 NOTE — Telephone Encounter (Signed)
 Please resend order to [facility].

## 2024-05-19 NOTE — Telephone Encounter (Signed)
 Copied from CRM #[id number]. Topic: General - Other >> May 19, 2024 11:26 AM Mercer PEDLAR wrote: Reason for CRM: Patient is calling regarding her bone density test, she stated that she was told that her insurance will not cover it at [facility], [address] and she is requesting for the order to be sent to a different facility which is closer to her house.

## 2024-05-19 NOTE — Addendum Note (Signed)
 Addended byBETHA LEONARDA BURDOCK C on: 05/19/2024 01:05 PM   Modules accepted: Orders

## 2024-05-20 ENCOUNTER — Other Ambulatory Visit (HOSPITAL_COMMUNITY): Payer: Self-pay

## 2024-05-23 ENCOUNTER — Other Ambulatory Visit (HOSPITAL_COMMUNITY): Payer: Self-pay

## 2024-05-25 ENCOUNTER — Other Ambulatory Visit: Payer: Self-pay

## 2024-05-26 ENCOUNTER — Other Ambulatory Visit (HOSPITAL_COMMUNITY): Payer: Self-pay

## 2024-05-26 ENCOUNTER — Other Ambulatory Visit: Payer: Self-pay | Admitting: Hematology and Oncology

## 2024-05-26 ENCOUNTER — Inpatient Hospital Stay: Payer: PPO | Attending: Hematology and Oncology

## 2024-05-26 DIAGNOSIS — D696 Thrombocytopenia, unspecified: Secondary | ICD-10-CM | POA: Insufficient documentation

## 2024-05-26 DIAGNOSIS — K7460 Unspecified cirrhosis of liver: Secondary | ICD-10-CM | POA: Insufficient documentation

## 2024-05-26 DIAGNOSIS — B3732 Chronic candidiasis of vulva and vagina: Secondary | ICD-10-CM | POA: Diagnosis not present

## 2024-05-26 DIAGNOSIS — N939 Abnormal uterine and vaginal bleeding, unspecified: Secondary | ICD-10-CM | POA: Insufficient documentation

## 2024-05-26 LAB — CMP (CANCER CENTER ONLY)
ALT: 15 U/L (ref 0–44)
AST: 33 U/L (ref 15–41)
Albumin: 3.6 g/dL (ref 3.5–5.0)
Alkaline Phosphatase: 87 U/L (ref 38–126)
Anion gap: 5 (ref 5–15)
BUN: 12 mg/dL (ref 8–23)
CO2: 33 mmol/L — ABNORMAL HIGH (ref 22–32)
Calcium: 9 mg/dL (ref 8.9–10.3)
Chloride: 104 mmol/L (ref 98–111)
Creatinine: 0.87 mg/dL (ref 0.44–1.00)
GFR, Estimated: >60 mL/min (ref >60)
Glucose, Bld: 84 mg/dL (ref 70–99)
Potassium: 3.7 mmol/L (ref 3.5–5.1)
Sodium: 142 mmol/L (ref 135–145)
Total Bilirubin: 1.1 mg/dL (ref 0.0–1.2)
Total Protein: 6.3 g/dL — ABNORMAL LOW (ref 6.5–8.1)

## 2024-05-26 LAB — CBC WITH DIFFERENTIAL (CANCER CENTER ONLY)
Abs Immature Granulocytes: 0 K/uL (ref 0.00–0.07)
Basophils Absolute: 0 K/uL (ref 0.0–0.1)
Basophils Relative: 1 %
Eosinophils Absolute: 0.3 K/uL (ref 0.0–0.5)
Eosinophils Relative: 6 %
HCT: 41.7 % (ref 36.0–46.0)
Hemoglobin: 14.1 g/dL (ref 12.0–15.0)
Immature Granulocytes: 0 %
Lymphocytes Relative: 27 %
Lymphs Abs: 1.1 K/uL (ref 0.7–4.0)
MCH: 33.7 pg (ref 26.0–34.0)
MCHC: 33.8 g/dL (ref 30.0–36.0)
MCV: 99.8 fL (ref 80.0–100.0)
Monocytes Absolute: 0.3 K/uL (ref 0.1–1.0)
Monocytes Relative: 8 %
Neutro Abs: 2.3 K/uL (ref 1.7–7.7)
Neutrophils Relative %: 58 %
Platelet Count: 79 K/uL — ABNORMAL LOW (ref 150–400)
RBC: 4.18 MIL/uL (ref 3.87–5.11)
RDW: 13.3 % (ref 11.5–15.5)
WBC Count: 4 K/uL (ref 4.0–10.5)
nRBC: 0 % (ref 0.0–0.2)

## 2024-05-27 ENCOUNTER — Ambulatory Visit: Admitting: Orthopedic Surgery

## 2024-06-01 DIAGNOSIS — H401131 Primary open-angle glaucoma, bilateral, mild stage: Secondary | ICD-10-CM | POA: Diagnosis not present

## 2024-06-01 DIAGNOSIS — H0102B Squamous blepharitis left eye, upper and lower eyelids: Secondary | ICD-10-CM | POA: Diagnosis not present

## 2024-06-01 DIAGNOSIS — Z961 Presence of intraocular lens: Secondary | ICD-10-CM | POA: Diagnosis not present

## 2024-06-01 DIAGNOSIS — H0102A Squamous blepharitis right eye, upper and lower eyelids: Secondary | ICD-10-CM | POA: Diagnosis not present

## 2024-06-02 ENCOUNTER — Other Ambulatory Visit: Payer: Self-pay | Admitting: Hematology and Oncology

## 2024-06-02 DIAGNOSIS — D696 Thrombocytopenia, unspecified: Secondary | ICD-10-CM

## 2024-06-03 ENCOUNTER — Ambulatory Visit: Payer: PPO | Admitting: Hematology and Oncology

## 2024-06-03 ENCOUNTER — Telehealth: Payer: Self-pay | Admitting: Hematology and Oncology

## 2024-06-03 ENCOUNTER — Other Ambulatory Visit: Payer: Self-pay

## 2024-06-03 ENCOUNTER — Other Ambulatory Visit (HOSPITAL_COMMUNITY): Payer: Self-pay

## 2024-06-04 NOTE — Progress Notes (Signed)
 [facility] [phone]   [phone]  PROGRESS NOTE  I connected with [name]  on 06/05/2024 by telephone visit and verified that I am speaking with the correct person using two identifiers.   I discussed the limitations, risks, security and privacy concerns of performing an evaluation and management service by telemedicine and the availability of in-person appointments. I also discussed with the patient that there may be a patient responsible charge related to this service. The patient expressed understanding and agreed to proceed.  Patient's location: Home Provider's location: Office   Patient Care Team: [name], Roxan BROCKS, NP as PCP - General (Family Medicine) Croitoru, Jerel, MD as PCP - Cardiology (Cardiology) Croitoru, Jerel, MD as Consulting Physician (Cardiology) Ivin Kocher, MD as Referring Physician (Dermatology) Gretel Ozell PARAS, DPM (Inactive) as Consulting Physician (Podiatry) Burundi, [name], OD (Optometry)  Hematological/Oncological History 1) Prior labs: -08/25/2020: WBC 6.1, Hgb 15.1, Plt 140 (L) -09/16/2020: WBC 3.9 (L), 15.5 (H), Plt 91 (L) -02/27/2021: WBC 4.7, Hgb 15.9, Plt 126 (L) -04/03/2021: WBC 4.9, Hgb 14.8, Plt 128 (L)  2) 04/14/2021: Establish care wit Johnston Police PA-C  CHIEF COMPLAINTS/PURPOSE OF CONSULTATION:  Thrombocytopenia  HISTORY OF PRESENTING ILLNESS:  Jill Preston 89 y.o. female returns for a follow up for thrombocytopenia. She was last seen on 06/04/2023.  On exam today, Ms. [name] reports she is doing well without any changes to her health since last visit.  She reports her energy and appetite are overall unchanged.  She denies nausea, vomiting or bowel habit changes.  She denies any pain at this time.  She denies any signs of overt bleeding including hematochezia or melena.  However, patient has noticed some vaginal bleeding mainly when she wipes.  She has history of chronic vaginal yeast  infection.  Patient denies fevers, chills, night sweats, shortness of breath, chest pain or cough.  She has no other complaints.  Rest of the 10 point ROS as below.  MEDICAL HISTORY:  Past Medical History:  Diagnosis Date   Arthritis    right knee   Cancer (HCC) yrs ago   skin cancer removed from face   Chronic diastolic CHF (congestive heart failure) (HCC)    Chronic venous insufficiency    LEA VENOUS, 10/17/2011 - mild reflux in bilateral common femoral veins   CKD (chronic kidney disease), stage III (HCC)    Coronary artery disease    a. s/p PCI/BMS to prox LAD and balloon angioplasty to mLAD with suboptimal result in 2000. b. Abnl nuc 2012, cath 09/2011 - showed that the overall territory of potential ischemia was small and attributable to a moderately diseased small second diagonal artery. Med rx.   Dyspnea    with activity   Headache    History of blood transfusion 2017   Hyperlipidemia    Hypertension    Hypertensive heart disease    Hypothyroidism    Obesity    Pneumonia    several times   Stroke [facility])    pt. states she had light stroke in sept. 1980   Urinary incontinence     SURGICAL HISTORY: Past Surgical History:  Procedure Laterality Date   ABDOMINAL HYSTERECTOMY  1970's   complete   ANKLE ARTHROSCOPY Right 07/10/2017   Procedure: ANKLE ARTHROSCOPY;  Surgeon: Gretel Ozell PARAS, DPM;  Location: [facility] OR;  Service: Podiatry;  Laterality: Right;   BACK SURGERY  07/26/2016   cervical neck surgery, [facility]   BALLOON DILATION N/A 12/01/2021  Procedure: BALLOON DILATION;  Surgeon: Rollin Dover, MD;  Location: [facility];  Service: Gastroenterology;  Laterality: N/A;   BLADDER SURGERY  2010   WITH MESH  bladder tach   bunion removal surgery Bilateral 15 yrs ago   CARDIAC CATHETERIZATION Left 09/25/2011   Medical management   CARDIAC CATHETERIZATION Left 03/25/2001   Normal LV function, LAD residual narrowing of less than 10%, normal ramus  intermediate, circumflex, and RCA,    CARDIAC CATHETERIZATION  09/04/1999   LAD, 3x59mm Tetra stent resulting in a reduction of the 80% stenosis to 0% residual   CARDIAC CATHETERIZATION N/A 01/26/2015   Procedure: Right/Left Heart Cath and Coronary Angiography;  Surgeon: Debby DELENA Sor, MD;  Location: [facility] INVASIVE CV LAB;  Service: Cardiovascular;  Laterality: N/A;   CARDIAC CATHETERIZATION N/A 01/27/2015   Procedure: Intravascular Pressure Wire/FFR Study;  Surgeon: Lonni JONETTA Cash, MD;  Location: [facility] INVASIVE CV LAB;  Service: Cardiovascular;  Laterality: N/A;   CARDIAC CATHETERIZATION N/A 01/27/2015   Procedure: Right Heart Cath;  Surgeon: Lonni JONETTA Cash, MD;  Location: [facility] INVASIVE CV LAB;  Service: Cardiovascular;  Laterality: N/A;   CHOLECYSTECTOMY     COLONOSCOPY WITH PROPOFOL  N/A 03/07/2017   Procedure: COLONOSCOPY WITH PROPOFOL ;  Surgeon: Kristie Lamprey, MD;  Location: [facility];  Service: Endoscopy;  Laterality: N/A;   CORONARY PRESSURE/FFR STUDY N/A 04/05/2017   Procedure: Intravascular Pressure Wire/FFR Study;  Surgeon: Swaziland, Peter M, MD;  Location: [facility] INVASIVE CV LAB;  Service: Cardiovascular;  Laterality: N/A;   CORONARY STENT PLACEMENT     LAD x 1   ESOPHAGOGASTRODUODENOSCOPY (EGD) WITH PROPOFOL  N/A 12/01/2021   Procedure: ESOPHAGOGASTRODUODENOSCOPY (EGD) WITH PROPOFOL ;  Surgeon: Rollin Dover, MD;  Location: [facility];  Service: Gastroenterology;  Laterality: N/A;   ganglion cyst removal  yrs ago   x 2   ILIAC VEIN ANGIOPLASTY / STENTING  02/15/2015   IVC FILTER INSERTION  2016   IVC FILTER REMOVAL  02/15/2015   at [facility]   LEFT HEART CATH AND CORONARY ANGIOGRAPHY N/A 04/05/2017   Procedure: Left Heart Cath and Coronary Angiography;  Surgeon: Swaziland, Peter M, MD;  Location: [facility] INVASIVE CV LAB;  Service: Cardiovascular;  Laterality: N/A;   LEFT HEART CATHETERIZATION WITH CORONARY ANGIOGRAM N/A 09/25/2011   Procedure: LEFT HEART CATHETERIZATION WITH CORONARY ANGIOGRAM;   Surgeon: Jerel Balding, MD;  Location: [facility] CATH LAB;  Service: Cardiovascular;  Laterality: N/A;   multiple bladder surgeries to remove mesh     ROTATOR CUFF REPAIR Right    stent to groin Left 08/2014   left leg   TENDON REPAIR Right 07/10/2017   Procedure: RIGHT PERONEAL TENDON REPAIR;  Surgeon: Gretel Ozell PARAS, DPM;  Location: [facility];  Service: Podiatry;  Laterality: Right;   TENDON REPAIR Right 05/27/2019   Procedure: PERONEAL TENDON REPAIR x2;  Surgeon: Gretel Ozell PARAS, DPM;  Location: [facility];  Service: Podiatry;  Laterality: Right;    SOCIAL HISTORY: Social History   Socioeconomic History   Marital status: Married    Spouse name: Not on file   Number of children: Not on file   Years of education: Not on file   Highest education level: Not on file  Occupational History   Not on file  Tobacco Use   Smoking status: Never   Smokeless tobacco: Never  Vaping Use   Vaping status: Never Used  Substance and Sexual Activity   Alcohol use: No   Drug use: No   Sexual activity: Not on file  Other  Topics Concern   Not on file  Social History Narrative   Per [facility] New Patient Packet Abstracted on 03/06/2021      Diet: Left blank       Caffeine: No      Married, if yes what year: Yes, 1955      Do you live in a house, apartment, assisted living, condo, trailer, ect: House      Is it one or more stories: 1 story      How many persons live in your home? 2      Pets: No      Highest level or education completed: 12 th grade      Current/Past profession: Research officer, political party       Exercise:          Yes       Type and how often: Walking          Living Will: Yes   DNR: No   POA/HPOA: Yes      Functional Status:   Do you have difficulty bathing or dressing yourself? Left Blank    Do you have difficulty preparing food or eating?Left Blank   Do you have difficulty managing your medications?Left Blank   Do you have difficulty managing your finances?Left Blank   Do you  have difficulty affording your medications?Left Blank   Social Drivers of Health   Financial Resource Strain: Not on file  Food Insecurity: Low Risk  (05/08/2024)   Received from [facility]   Hunger Vital Sign    Within the past 12 months, you worried that your food would run out before you got money to buy more: Never true    Within the past 12 months, the food you bought just didn't last and you didn't have money to get more. : Never true  Transportation Needs: No Transportation Needs (05/08/2024)   Received from [facility]    In the past 12 months, has lack of reliable transportation kept you from medical appointments, meetings, work or from getting things needed for daily living? : No  Physical Activity: Not on file  Stress: Not on file  Social Connections: Unknown (11/13/2023)   Social Connection and Isolation Panel    Frequency of Communication with Friends and Family: More than three times a week    Frequency of Social Gatherings with Friends and Family: Three times a week    Attends Religious Services: Patient declined    Active Member of Clubs or Organizations: No    Attends Banker Meetings: Never    Marital Status: Married  Catering manager Violence: Not At Risk (11/18/2023)   Humiliation, Afraid, Rape, and Kick questionnaire    Fear of Current or Ex-Partner: No    Emotionally Abused: No    Physically Abused: No    Sexually Abused: No    FAMILY HISTORY: Family History  Problem Relation Age of Onset   Cancer Mother    Heart disease Father    Heart disease Sister    High blood pressure Sister    Heart disease Sister    Cancer Sister    Heart disease Brother    Diabetes Brother    Brain cancer Brother    High blood pressure Brother    Heart disease Brother    Heart attack Brother    Heart disease Brother    High blood pressure Brother     ALLERGIES:  is allergic to atorvastatin, penicillins, shellfish  allergy, aminophylline ,  iodinated contrast media, latex, oxybutynin chloride, propoxyphene, vancomycin , adhesive [tape], betadine [povidone iodine], ciprofloxacin, codeine , and ranolazine .  MEDICATIONS:  Current Outpatient Medications  Medication Sig Dispense Refill   amLODipine  (NORVASC ) 2.5 MG tablet Take 1 tablet (2.5 mg total) by mouth daily. 90 tablet 0   cyanocobalamin  (VITAMIN B12) 1000 MCG tablet Take 1 tablet (1,000 mcg total) by mouth daily.     ezetimibe  (ZETIA ) 10 MG tablet Take 1 tablet (10 mg total) by mouth every morning. 90 tablet 3   gabapentin  (NEURONTIN ) 300 MG capsule Take 1 capsule (300 mg total) by mouth at bedtime. 90 capsule 1   latanoprost  (XALATAN ) 0.005 % ophthalmic solution Place 1 drop into both eyes at bedtime. 7.5 mL 3   levothyroxine  (SYNTHROID ) 75 MCG tablet Take 1 tablet (75 mcg total) by mouth daily before breakfast. 90 tablet 1   Nutritional Supplements (ENSURE HIGH PROTEIN) LIQD Take 1 Can by mouth daily.     nystatin  cream (MYCOSTATIN ) Apply 1 Application topically 2 (two) times daily. Apply to affected area on abdominal folds and peri-area 30 g 3   nystatin  cream (MYCOSTATIN ) Apply 1 Application topically 2 (two) times daily. Apply to vaginal and groin folds twice daily until resolved. 30 g 2   OVER THE COUNTER MEDICATION Take 1 capsule by mouth daily. OmegaXL joint and muscle support     potassium chloride  SA (KLOR-CON  M) 20 MEQ tablet Take 2 tablets by mouth in the morning and 1 tablet in the afternoon 270 tablet 1   pravastatin  (PRAVACHOL ) 80 MG tablet Take 1 tablet (80 mg total) by mouth at bedtime. 90 tablet 3   Psyllium 28.3 % POWD Follow Package Directions. 861 g 5   sertraline  (ZOLOFT ) 25 MG tablet Take 1 tablet (25 mg total) by mouth daily. 90 tablet 3   torsemide  (DEMADEX ) 20 MG tablet Take 2 tablets (40 mg total) by mouth daily. 180 tablet 1   No current facility-administered medications for this visit.    REVIEW OF SYSTEMS:   Constitutional: ( - ) fevers, ( - )   chills , ( - ) night sweats Eyes: ( - ) blurriness of vision, ( - ) double vision, ( - ) watery eyes Ears, nose, mouth, throat, and face: ( - ) mucositis, ( - ) sore throat Respiratory: ( - ) cough, ( - ) dyspnea, ( - ) wheezes Cardiovascular: ( - ) palpitation, ( - ) chest discomfort, ( - ) lower extremity swelling Gastrointestinal:  ( - ) nausea, ( - ) heartburn, ( - ) change in bowel habits Skin: ( - ) abnormal skin rashes Lymphatics: ( - ) new lymphadenopathy, ( + ) easy bruising Neurological: ( - ) numbness, ( - ) tingling, ( - ) new weaknesses Behavioral/Psych: ( - ) mood change, ( - ) new changes  All other systems were reviewed with the patient and are negative.  PHYSICAL EXAMINATION: Not performed due to virtual visit.   LABORATORY DATA:  I have reviewed the data as listed    Latest Ref Rng & Units 05/26/2024   11:02 AM 11/19/2023   11:17 AM 11/16/2023    8:40 AM  CBC  WBC 4.0 - 10.5 K/uL 4.0  5.9  4.7   Hemoglobin 12.0 - 15.0 g/dL 85.8  85.8  85.6   Hematocrit 36.0 - 46.0 % 41.7  42.2  42.1   Platelets 150 - 400 K/uL 79  175  85  Latest Ref Rng & Units 05/26/2024   11:02 AM 03/11/2024    9:47 AM 03/04/2024   11:17 AM  CMP  Glucose 70 - 99 mg/dL 84  863  874   BUN 8 - 23 mg/dL 12  15  15    Creatinine 0.44 - 1.00 mg/dL 9.12  9.06  9.09   Sodium 135 - 145 mmol/L 142  144  147   Potassium 3.5 - 5.1 mmol/L 3.7  4.0  4.4   Chloride 98 - 111 mmol/L 104  101  107   CO2 22 - 32 mmol/L 33  26  25   Calcium 8.9 - 10.3 mg/dL 9.0  9.2  9.1   Total Protein 6.5 - 8.1 g/dL 6.3     Total Bilirubin 0.0 - 1.2 mg/dL 1.1     Alkaline Phos 38 - 126 U/L 87     AST 15 - 41 U/L 33     ALT 0 - 44 U/L 15        RADIOGRAPHIC STUDIES: No images were reviewed during this visit.   ASSESSMENT & PLAN SHAMARIA KAVAN is a 89 y.o. female who presents to the clinic for evaluation for thrombocytopenia.  #Thrombocytopenia: --Secondary to cirrhosis with splenomegaly seen again on CT scan  from 11/12/2022. --Labs from 06/04/2023 showed no evidence of vitamin B12 or folate deficiencies along with normal immature platelet fraction. --Labs shows platelet levels oscillate between 40-85K over the last several years.  --Reviewed labs from 05/26/2024 with platelet count of 79K which is her baseline.  --Patient does bruise easily but no signs of bleeding.  --No indication for bone marrow biopsy at this time as low concern for underlying bone marrow disorder.  --RTC in 1 year with repeat labs   #Chronic vaginal yeast infection #Vaginal bleeding with wiping: --Referral to gynecology placed today.    No orders of the defined types were placed in this encounter.   All questions were answered. The patient knows to call the clinic with any problems, questions or concerns.  I have spent a total of 20 minutes minutes of non-face-to-face time, preparing to see the patient, performing a medically appropriate examination, counseling and educating the patient, ordering tests,  documenting clinical information in the electronic health record, and care coordination.   Johnston Police, PA-C Department of Hematology/Oncology [facility] Phone: [phone]

## 2024-06-05 ENCOUNTER — Inpatient Hospital Stay: Admitting: Physician Assistant

## 2024-06-05 DIAGNOSIS — D696 Thrombocytopenia, unspecified: Secondary | ICD-10-CM | POA: Diagnosis not present

## 2024-06-05 DIAGNOSIS — N939 Abnormal uterine and vaginal bleeding, unspecified: Secondary | ICD-10-CM | POA: Diagnosis not present

## 2024-06-11 ENCOUNTER — Encounter: Payer: Self-pay | Admitting: Family

## 2024-06-15 ENCOUNTER — Other Ambulatory Visit: Payer: Self-pay

## 2024-06-15 ENCOUNTER — Ambulatory Visit: Admitting: Orthopedic Surgery

## 2024-06-15 ENCOUNTER — Other Ambulatory Visit (INDEPENDENT_AMBULATORY_CARE_PROVIDER_SITE_OTHER): Payer: Self-pay

## 2024-06-15 DIAGNOSIS — M65311 Trigger thumb, right thumb: Secondary | ICD-10-CM | POA: Diagnosis not present

## 2024-06-15 DIAGNOSIS — M79641 Pain in right hand: Secondary | ICD-10-CM

## 2024-06-15 DIAGNOSIS — M79642 Pain in left hand: Secondary | ICD-10-CM

## 2024-06-15 MED ORDER — LIDOCAINE HCL 1 % IJ SOLN
1.0000 mL | INTRAMUSCULAR | Status: AC | PRN
  Administered 2024-06-15: 1 mL

## 2024-06-15 MED ORDER — BETAMETHASONE SOD PHOS & ACET 6 (3-3) MG/ML IJ SUSP
6.0000 mg | INTRAMUSCULAR | Status: AC | PRN
  Administered 2024-06-15: 6 mg via INTRA_ARTICULAR

## 2024-06-15 NOTE — Progress Notes (Signed)
 Jill Preston - 89 y.o. female MRN [id number]  Date of birth: 05-26-1933  Office Visit Note: Visit Date: 06/15/2024 PCP: Leonarda Roxan BROCKS, NP Referred by: Leonarda Roxan BROCKS, NP  Subjective: No chief complaint on file.  HPI: Jill Preston is a pleasant 89 y.o. female who presents today for bilateral hand pain, right thumb trigger digit. She states she had a fall 1 year ago and the pain has been present ever since.  She does have some ongoing soreness at the basilar aspects of the thumbs bilaterally as well.  She states that she has notable clicking and locking of the right thumb, often requiring manual correction.  Has not undergone any prior treatments for the right thumb triggering.  She is here today for specific hand surgical evaluation.  Pertinent ROS were reviewed with the patient and found to be negative unless otherwise specified above in HPI.   Visit Reason: bilateral hand Duration of symptoms: 1+ year Hand dominance: left Occupation: retired Diabetic: No Smoking: No Heart/Lung History: heart Blood Thinners: none  Prior Testing/EMG: none Injections (Date): none Treatments: none Prior Surgery: none    Assessment & Plan: Visit Diagnoses:  1. Trigger thumb, right thumb   2. Bilateral hand pain     Plan: Extensive discussion was had with the patient today regarding her right thumb trigger digit.  We discussed the etiology and pathophysiology of stenosing tenosynovitis.  We discussed conservative versus surgical treatment modalities.  From a conservative standpoint, we discussed activity modification, splinting, therapy and injections.  From a surgical standpoint, we discussed the possibility for trigger digit release as well as all risk and benefits associated.  Given that she has not trialed conservative treatments, patient is appropriate candidate for cortisone injection to the right thumb finger A1 pulley for symptom relief.  Risks and benefit of the cortisone  injection were discussed in detail, patient agreed to proceed.  Injection was provided today without issue, patient will return in approximate 6 weeks time for a recheck.   I did also discussed with her her underlying bilateral thumb CMC arthritis.  However, given that her symptoms are relatively mild at this point despite radiographic workup, we could continue with observation for the time being.  She expressed full understanding.  Follow-up: No follow-ups on file.   Meds & Orders: No orders of the defined types were placed in this encounter.   Orders Placed This Encounter  Procedures   XR Wrist Complete Right   XR Hand Complete Left     Procedures: Hand/UE Inj: R thumb A1 for trigger finger on 06/15/2024 7:09 PM Indications: pain Details: 25 G needle, volar approach Medications: 1 mL lidocaine  1 %; 6 mg betamethasone  acetate-betamethasone  sodium phosphate  6 (3-3) MG/ML Procedure, treatment alternatives, risks and benefits explained, specific risks discussed. Consent was given by the patient. Patient was prepped and draped in the usual sterile fashion.          Clinical History: No specialty comments available.  She reports that she has never smoked. She has never used smokeless tobacco. No results for input(s): HGBA1C, LABURIC in the last 8760 hours.  Objective:   Vital Signs: There were no vitals taken for this visit.  Physical Exam  Gen: Well-appearing, in no acute distress; non-toxic CV: Regular Rate. Well-perfused. Warm.  Resp: Breathing unlabored on room air; no wheezing. Psych: Fluid speech in conversation; appropriate affect; normal thought process  Ortho Exam Right hand: - Palpable nodule at the A1 pulley of the thumb,  associated tenderness - Notable clicking with deep flexion of the thumb, there is evidence of significant locking with deep flexion - Sensation intact distally, hand remains warm well-perfused\  Imaging: XR Hand Complete Left Result Date:  06/15/2024 X-rays of the left hand demonstrate diffuse small joint arthritis throughout the DIP and PIPs in particular of the index through small finger.  Diffuse bone mineralization is seen.  Thumb degenerative changes at the [facility] interval are also appreciated.  XR Wrist Complete Right Result Date: 06/15/2024 X-rays of the right wrist demonstrate significant thumb degenerative changes at the [facility] interval with joint space narrowing osteophyte formation.  There is associated STT arthritis seen as well.  Notable bone demineralization is seen diffusely.   Past Medical/Family/Surgical/Social History: Medications & Allergies reviewed per EMR, new medications updated. Patient Active Problem List   Diagnosis Date Noted   Vertigo 11/15/2023   Confusion 11/15/2023   Hypomagnesemia 11/13/2023   Orthostatic hypotension 11/13/2023   Frequent falls 11/12/2023   Hyponatremia 11/12/2023   Concussion 11/13/2022   Head injury due to trauma 11/12/2022   Fall 10/25/2022   Sick sinus syndrome (HCC) 03/07/2022   Hordeolum externum (stye) 05/16/2021   Chronic otitis media with serous effusion 05/16/2021   Thrombocytopenia 04/17/2021   PAD (peripheral artery disease) 08/01/2018   Herniated intervertebral disc of lumbar spine 06/02/2018   Bilateral leg pain 01/27/2017   Cervical spine degeneration 10/23/2015   Hypersomnolence 10/23/2015   S/P IVC filter 02/15/2015   Angina pectoris 01/26/2015   CAD in native artery    Ischemic chest pain    Edema of left lower extremity 09/16/2014   Acute deep vein thrombosis of left lower extremity (HCC) 09/14/2014   Anemia associated with acute blood loss 09/14/2014   Subclavian artery stenosis, left 08/04/2014   Chronic diastolic CHF (congestive heart failure) (HCC) 07/16/2014   Essential hypertension 07/08/2014   CKD (chronic kidney disease), stage III (HCC)    Hypertensive heart disease    Chronic venous insufficiency    Metatarsal deformity 06/24/2013   CAD -  LAD stent 12/00. Patent '02 and 1/13, moderate D2 managed medically 02/26/2013   Dyslipidemia 02/26/2013   DJD (degenerative joint disease)- back 02/26/2013   Asthma 08/06/2012   Hyperlipidemia 08/05/2012   Lichen sclerosus 06/03/2012   Rectocele 06/03/2012   Recurrent UTI 06/03/2012   Urge urinary incontinence 06/03/2012   Voiding dysfunction 06/03/2012   Hypothyroidism 07/23/2011   Past Medical History:  Diagnosis Date   Arthritis    right knee   Cancer (HCC) yrs ago   skin cancer removed from face   Chronic diastolic CHF (congestive heart failure) (HCC)    Chronic venous insufficiency    LEA VENOUS, 10/17/2011 - mild reflux in bilateral common femoral veins   CKD (chronic kidney disease), stage III (HCC)    Coronary artery disease    a. s/p PCI/BMS to prox LAD and balloon angioplasty to mLAD with suboptimal result in 2000. b. Abnl nuc 2012, cath 09/2011 - showed that the overall territory of potential ischemia was small and attributable to a moderately diseased small second diagonal artery. Med rx.   Dyspnea    with activity   Headache    History of blood transfusion 2017   Hyperlipidemia    Hypertension    Hypertensive heart disease    Hypothyroidism    Obesity    Pneumonia    several times   Stroke [facility])    pt. states she had light stroke in sept. 1980  Urinary incontinence    Family History  Problem Relation Age of Onset   Cancer Mother    Heart disease Father    Heart disease Sister    High blood pressure Sister    Heart disease Sister    Cancer Sister    Heart disease Brother    Diabetes Brother    Brain cancer Brother    High blood pressure Brother    Heart disease Brother    Heart attack Brother    Heart disease Brother    High blood pressure Brother    Past Surgical History:  Procedure Laterality Date   ABDOMINAL HYSTERECTOMY  1970's   complete   ANKLE ARTHROSCOPY Right 07/10/2017   Procedure: ANKLE ARTHROSCOPY;  Surgeon: Gretel Ozell PARAS, DPM;   Location: [facility] OR;  Service: Podiatry;  Laterality: Right;   BACK SURGERY  07/26/2016   cervical neck surgery, [facility]   BALLOON DILATION N/A 12/01/2021   Procedure: BALLOON DILATION;  Surgeon: Rollin Dover, MD;  Location: [facility] ENDOSCOPY;  Service: Gastroenterology;  Laterality: N/A;   BLADDER SURGERY  2010   WITH MESH  bladder tach   bunion removal surgery Bilateral 15 yrs ago   CARDIAC CATHETERIZATION Left 09/25/2011   Medical management   CARDIAC CATHETERIZATION Left 03/25/2001   Normal LV function, LAD residual narrowing of less than 10%, normal ramus intermediate, circumflex, and RCA,    CARDIAC CATHETERIZATION  09/04/1999   LAD, 3x52mm Tetra stent resulting in a reduction of the 80% stenosis to 0% residual   CARDIAC CATHETERIZATION N/A 01/26/2015   Procedure: Right/Left Heart Cath and Coronary Angiography;  Surgeon: Debby DELENA Sor, MD;  Location: [facility] INVASIVE CV LAB;  Service: Cardiovascular;  Laterality: N/A;   CARDIAC CATHETERIZATION N/A 01/27/2015   Procedure: Intravascular Pressure Wire/FFR Study;  Surgeon: Lonni JONETTA Cash, MD;  Location: [facility] INVASIVE CV LAB;  Service: Cardiovascular;  Laterality: N/A;   CARDIAC CATHETERIZATION N/A 01/27/2015   Procedure: Right Heart Cath;  Surgeon: Lonni JONETTA Cash, MD;  Location: [facility] INVASIVE CV LAB;  Service: Cardiovascular;  Laterality: N/A;   CHOLECYSTECTOMY     COLONOSCOPY WITH PROPOFOL  N/A 03/07/2017   Procedure: COLONOSCOPY WITH PROPOFOL ;  Surgeon: Kristie Lamprey, MD;  Location: [facility];  Service: Endoscopy;  Laterality: N/A;   CORONARY PRESSURE/FFR STUDY N/A 04/05/2017   Procedure: Intravascular Pressure Wire/FFR Study;  Surgeon: Swaziland, Peter M, MD;  Location: [facility] INVASIVE CV LAB;  Service: Cardiovascular;  Laterality: N/A;   CORONARY STENT PLACEMENT     LAD x 1   ESOPHAGOGASTRODUODENOSCOPY (EGD) WITH PROPOFOL  N/A 12/01/2021   Procedure: ESOPHAGOGASTRODUODENOSCOPY (EGD) WITH PROPOFOL ;  Surgeon: Rollin Dover, MD;   Location: [facility];  Service: Gastroenterology;  Laterality: N/A;   ganglion cyst removal  yrs ago   x 2   ILIAC VEIN ANGIOPLASTY / STENTING  02/15/2015   IVC FILTER INSERTION  2016   IVC FILTER REMOVAL  02/15/2015   at [facility]   LEFT HEART CATH AND CORONARY ANGIOGRAPHY N/A 04/05/2017   Procedure: Left Heart Cath and Coronary Angiography;  Surgeon: Swaziland, Peter M, MD;  Location: [facility] INVASIVE CV LAB;  Service: Cardiovascular;  Laterality: N/A;   LEFT HEART CATHETERIZATION WITH CORONARY ANGIOGRAM N/A 09/25/2011   Procedure: LEFT HEART CATHETERIZATION WITH CORONARY ANGIOGRAM;  Surgeon: Jerel Balding, MD;  Location: [facility] CATH LAB;  Service: Cardiovascular;  Laterality: N/A;   multiple bladder surgeries to remove mesh     ROTATOR CUFF REPAIR Right    stent to groin Left 08/2014  left leg   TENDON REPAIR Right 07/10/2017   Procedure: RIGHT PERONEAL TENDON REPAIR;  Surgeon: Gretel Ozell PARAS, DPM;  Location: [facility] OR;  Service: Podiatry;  Laterality: Right;   TENDON REPAIR Right 05/27/2019   Procedure: PERONEAL TENDON REPAIR x2;  Surgeon: Gretel Ozell PARAS, DPM;  Location: [facility] ORS;  Service: Podiatry;  Laterality: Right;   Social History   Occupational History   Not on file  Tobacco Use   Smoking status: Never   Smokeless tobacco: Never  Vaping Use   Vaping status: Never Used  Substance and Sexual Activity   Alcohol use: No   Drug use: No   Sexual activity: Not on file    Ulyess Muto Afton Alderton, M.D. [facility], Hand Surgery

## 2024-06-17 DIAGNOSIS — Z1231 Encounter for screening mammogram for malignant neoplasm of breast: Secondary | ICD-10-CM | POA: Diagnosis not present

## 2024-06-17 DIAGNOSIS — M8589 Other specified disorders of bone density and structure, multiple sites: Secondary | ICD-10-CM | POA: Diagnosis not present

## 2024-06-17 LAB — HM DEXA SCAN

## 2024-06-17 LAB — HM MAMMOGRAPHY

## 2024-06-18 ENCOUNTER — Encounter: Payer: Self-pay | Admitting: Family

## 2024-06-19 ENCOUNTER — Ambulatory Visit: Payer: Self-pay | Admitting: Family

## 2024-07-07 ENCOUNTER — Other Ambulatory Visit: Payer: Self-pay | Admitting: Cardiovascular Disease

## 2024-07-09 NOTE — Telephone Encounter (Signed)
 Pt of Dr. Francyne. Does Dr. Francyne want to refill or send this to PCP? Please advise.

## 2024-07-16 ENCOUNTER — Other Ambulatory Visit (HOSPITAL_COMMUNITY): Payer: Self-pay

## 2024-07-16 ENCOUNTER — Other Ambulatory Visit: Payer: Self-pay | Admitting: Family

## 2024-07-17 ENCOUNTER — Telehealth: Payer: Self-pay

## 2024-07-17 ENCOUNTER — Other Ambulatory Visit (HOSPITAL_COMMUNITY): Payer: Self-pay

## 2024-07-17 MED ORDER — LEVOTHYROXINE SODIUM 75 MCG PO TABS
75.0000 ug | ORAL_TABLET | Freq: Every day | ORAL | 1 refills | Status: AC
  Filled 2024-07-17: qty 90, 90d supply, fill #0
  Filled 2024-10-08: qty 90, 90d supply, fill #1

## 2024-07-17 NOTE — Telephone Encounter (Signed)
 Patient wanted to know if we received her Bine density and Mammogram info. Informed her we received it and someone called and spoke with her on 06/22/2024. Patient also wants to discuss something personal regarding her husband on Monday at appointment.

## 2024-07-20 ENCOUNTER — Encounter: Payer: Self-pay | Admitting: Family

## 2024-07-20 ENCOUNTER — Ambulatory Visit: Payer: PPO | Admitting: Family

## 2024-07-20 DIAGNOSIS — Z0000 Encounter for general adult medical examination without abnormal findings: Secondary | ICD-10-CM | POA: Diagnosis not present

## 2024-07-20 NOTE — Telephone Encounter (Signed)
 FYI : Patient seen today.discussed patient's inguinal hernia but stated was advised by specialist to monitor no surgery recommended for now.

## 2024-07-20 NOTE — Patient Instructions (Signed)
 [name] , Thank you for taking time to come for your Medicare Wellness Visit. I appreciate your ongoing commitment to your health goals. Please review the following plan we discussed and let me know if I can assist you in the future.   Screening recommendations/referrals: Colonoscopy : N/A  Mammogram : N/A  Bone Density : Up to date  Recommended yearly ophthalmology/optometry visit for glaucoma screening and checkup Recommended yearly dental visit for hygiene and checkup  Vaccinations: Influenza vaccine- due annually in September/October Pneumococcal vaccine : Up to date  Tdap vaccine : Up to date  Shingles vaccine : advised to get shingles vaccine at the pharmacy     Advanced directives: Yes   Conditions/risks identified:   Next appointment: 1  year    Preventive Care 37 Years and Older, Female Preventive care refers to lifestyle choices and visits with your health care provider that can promote health and wellness. What does preventive care include? A yearly physical exam. This is also called an annual well check. Dental exams once or twice a year. Routine eye exams. Ask your health care provider how often you should have your eyes checked. Personal lifestyle choices, including: Daily care of your teeth and gums. Regular physical activity. Eating a healthy diet. Avoiding tobacco and drug use. Limiting alcohol use. Practicing safe sex. Taking low-dose aspirin  every day. Taking vitamin and mineral supplements as recommended by your health care provider. What happens during an annual well check? The services and screenings done by your health care provider during your annual well check will depend on your age, overall health, lifestyle risk factors, and family history of disease. Counseling  Your health care provider may ask you questions about your: Alcohol use. Tobacco use. Drug use. Emotional well-being. Home and relationship well-being. Sexual activity. Eating  habits. History of falls. Memory and ability to understand (cognition). Work and work astronomer. Reproductive health. Screening  You may have the following tests or measurements: Height, weight, and BMI. Blood pressure. Lipid and cholesterol levels. These may be checked every 5 years, or more frequently if you are over 35 years old. Skin check. Lung cancer screening. You may have this screening every year starting at age 27 if you have a 30-pack-year history of smoking and currently smoke or have quit within the past 15 years. Fecal occult blood test (FOBT) of the stool. You may have this test every year starting at age 40. Flexible sigmoidoscopy or colonoscopy. You may have a sigmoidoscopy every 5 years or a colonoscopy every 10 years starting at age 16. Hepatitis C blood test. Hepatitis B blood test. Sexually transmitted disease (STD) testing. Diabetes screening. This is done by checking your blood sugar (glucose) after you have not eaten for a while (fasting). You may have this done every 1-3 years. Bone density scan. This is done to screen for osteoporosis. You may have this done starting at age 23. Mammogram. This may be done every 1-2 years. Talk to your health care provider about how often you should have regular mammograms. Talk with your health care provider about your test results, treatment options, and if necessary, the need for more tests. Vaccines  Your health care provider may recommend certain vaccines, such as: Influenza vaccine. This is recommended every year. Tetanus, diphtheria, and acellular pertussis (Tdap, Td) vaccine. You may need a Td booster every 10 years. Zoster vaccine. You may need this after age 53. Pneumococcal 13-valent conjugate (PCV13) vaccine. One dose is recommended after age 36. Pneumococcal polysaccharide (  PPSV23) vaccine. One dose is recommended after age 77. Talk to your health care provider about which screenings and vaccines you need and how  often you need them. This information is not intended to replace advice given to you by your health care provider. Make sure you discuss any questions you have with your health care provider. Document Released: 09/30/2015 Document Revised: 05/23/2016 Document Reviewed: 07/05/2015 Elsevier Interactive Patient Education  2017 [employer].  Fall Prevention in the Home Falls can cause injuries. They can happen to people of all ages. There are many things you can do to make your home safe and to help prevent falls. What can I do on the outside of my home? Regularly fix the edges of walkways and driveways and fix any cracks. Remove anything that might make you trip as you walk through a door, such as a raised step or threshold. Trim any bushes or trees on the path to your home. Use bright outdoor lighting. Clear any walking paths of anything that might make someone trip, such as rocks or tools. Regularly check to see if handrails are loose or broken. Make sure that both sides of any steps have handrails. Any raised decks and porches should have guardrails on the edges. Have any leaves, snow, or ice cleared regularly. Use sand or salt on walking paths during winter. Clean up any spills in your garage right away. This includes oil or grease spills. What can I do in the bathroom? Use night lights. Install grab bars by the toilet and in the tub and shower. Do not use towel bars as grab bars. Use non-skid mats or decals in the tub or shower. If you need to sit down in the shower, use a plastic, non-slip stool. Keep the floor dry. Clean up any water that spills on the floor as soon as it happens. Remove soap buildup in the tub or shower regularly. Attach bath mats securely with double-sided non-slip rug tape. Do not have throw rugs and other things on the floor that can make you trip. What can I do in the bedroom? Use night lights. Make sure that you have a light by your bed that is easy to  reach. Do not use any sheets or blankets that are too big for your bed. They should not hang down onto the floor. Have a firm chair that has side arms. You can use this for support while you get dressed. Do not have throw rugs and other things on the floor that can make you trip. What can I do in the kitchen? Clean up any spills right away. Avoid walking on wet floors. Keep items that you use a lot in easy-to-reach places. If you need to reach something above you, use a strong step stool that has a grab bar. Keep electrical cords out of the way. Do not use floor polish or wax that makes floors slippery. If you must use wax, use non-skid floor wax. Do not have throw rugs and other things on the floor that can make you trip. What can I do with my stairs? Do not leave any items on the stairs. Make sure that there are handrails on both sides of the stairs and use them. Fix handrails that are broken or loose. Make sure that handrails are as long as the stairways. Check any carpeting to make sure that it is firmly attached to the stairs. Fix any carpet that is loose or worn. Avoid having throw rugs at the top or  bottom of the stairs. If you do have throw rugs, attach them to the floor with carpet tape. Make sure that you have a light switch at the top of the stairs and the bottom of the stairs. If you do not have them, ask someone to add them for you. What else can I do to help prevent falls? Wear shoes that: Do not have high heels. Have rubber bottoms. Are comfortable and fit you well. Are closed at the toe. Do not wear sandals. If you use a stepladder: Make sure that it is fully opened. Do not climb a closed stepladder. Make sure that both sides of the stepladder are locked into place. Ask someone to hold it for you, if possible. Clearly mark and make sure that you can see: Any grab bars or handrails. First and last steps. Where the edge of each step is. Use tools that help you move  around (mobility aids) if they are needed. These include: Canes. Walkers. Scooters. Crutches. Turn on the lights when you go into a dark area. Replace any light bulbs as soon as they burn out. Set up your furniture so you have a clear path. Avoid moving your furniture around. If any of your floors are uneven, fix them. If there are any pets around you, be aware of where they are. Review your medicines with your doctor. Some medicines can make you feel dizzy. This can increase your chance of falling. Ask your doctor what other things that you can do to help prevent falls. This information is not intended to replace advice given to you by your health care provider. Make sure you discuss any questions you have with your health care provider. Document Released: 06/30/2009 Document Revised: 02/09/2016 Document Reviewed: 10/08/2014 Elsevier Interactive Patient Education  2017 [employer].

## 2024-07-20 NOTE — Telephone Encounter (Signed)
 Noted

## 2024-07-20 NOTE — Progress Notes (Addendum)
 Subjective:   Jill Preston is a 89 y.o. female who presents for a Medicare Annual Wellness Visit.  Allergies (verified) Atorvastatin, Penicillins, Shellfish allergy, Aminophylline , Iodinated contrast media, Latex, Oxybutynin chloride, Propoxyphene, Vancomycin , Adhesive [tape], Betadine [povidone iodine], Ciprofloxacin, Codeine , and Ranolazine    History: Past Medical History:  Diagnosis Date   Arthritis    right knee   Cancer (HCC) yrs ago   skin cancer removed from face   Chronic diastolic CHF (congestive heart failure) (HCC)    Chronic venous insufficiency    LEA VENOUS, 10/17/2011 - mild reflux in bilateral common femoral veins   CKD (chronic kidney disease), stage III (HCC)    Coronary artery disease    a. s/p PCI/BMS to prox LAD and balloon angioplasty to mLAD with suboptimal result in 2000. b. Abnl nuc 2012, cath 09/2011 - showed that the overall territory of potential ischemia was small and attributable to a moderately diseased small second diagonal artery. Med rx.   Dyspnea    with activity   Headache    History of blood transfusion 2017   Hyperlipidemia    Hypertension    Hypertensive heart disease    Hypothyroidism    Obesity    Pneumonia    several times   Stroke [facility]    pt. states she had light stroke in sept. 1980   Urinary incontinence    Past Surgical History:  Procedure Laterality Date   ABDOMINAL HYSTERECTOMY  1970's   complete   ANKLE ARTHROSCOPY Right 07/10/2017   Procedure: ANKLE ARTHROSCOPY;  Surgeon: Gretel Ozell PARAS, DPM;  [facility];  Service: Podiatry;  Laterality: Right;   BACK SURGERY  07/26/2016   cervical neck surgery, [facility]   BALLOON DILATION N/A 12/01/2021   Procedure: BALLOON DILATION;  Surgeon: Rollin Dover, MD;  [facility];  Service: Gastroenterology;  Laterality: N/A;   BLADDER SURGERY  2010   WITH MESH  bladder tach   bunion removal surgery Bilateral 15 yrs ago   CARDIAC CATHETERIZATION  Left 09/25/2011   Medical management   CARDIAC CATHETERIZATION Left 03/25/2001   Normal LV function, LAD residual narrowing of less than 10%, normal ramus intermediate, circumflex, and RCA,    CARDIAC CATHETERIZATION  09/04/1999   LAD, 3x42mm Tetra stent resulting in a reduction of the 80% stenosis to 0% residual   CARDIAC CATHETERIZATION N/A 01/26/2015   Procedure: Right/Left Heart Cath and Coronary Angiography;  Surgeon: Debby DELENA Sor, MD;  [facility] INVAS[facility] CV LAB;  Service: Cardiovascular;  Laterality: N/A;   CARDIAC CATHETERIZATION N/A 01/27/2015   Procedure: Intravascular Pressure Wire/FFR Study;  Surgeon: Lonni JONETTA Cash, MD;  Location: [facility] INVASIVE CV LAB;  Service: Cardiovascular;  Laterality: N/A;   CARDIAC CATHETERIZATION N/A 01/27/2015   Procedure: Right Heart Cath;  Surgeon: Lonni JONETTA Cash, MD;  Location: [facility] INVASIVE CV LAB;  Service: Cardiovascular;  Laterality: N/A;   CHOLECYSTECTOMY     COLONOSCOPY WITH PROPOFOL  N/A 03/07/2017   Procedure: COLONOSCOPY WITH PROPOFOL ;  Surgeon: Kristie Lamprey, MD;  [facility];  Service: Endoscopy;  Laterality: N/A;   CORONARY PRESSURE/FFR STUDY N/A 04/05/2017   Procedure: Intravascular Pressure Wire/FFR Study;  Surgeon: Jordan, Peter M, MD;  Location: [facility] INVASIVE CV LAB;  Service: Cardiovascular;  Laterality: N/A;   CORONARY STENT PLACEMENT     LAD x 1   ESOPHAGOGASTRODUODENOSCOPY (EGD) WITH PROPOFOL  N/A 12/01/2021   Procedure: ESOPHAGOGASTRODUODENOSCOPY (EGD) WITH PROPOFOL ;  Surgeon: Rollin Dover, MD;  [facility];  Service:  Gastroenterology;  Laterality: N/A;   ganglion cyst removal  yrs ago   x 2   ILIAC VEIN ANGIOPLASTY / STENTING  02/15/2015   IVC FILTER INSERTION  2016   IVC FILTER REMOVAL  02/15/2015   at [facility]   LEFT HEART CATH AND CORONARY ANGIOGRAPHY N/A 04/05/2017   Procedure: Left Heart Cath and Coronary Angiography;  Surgeon: Jordan, Peter M, MD;  Location: [facility] INVASIVE CV LAB;  Service:  Cardiovascular;  Laterality: N/A;   LEFT HEART CATHETERIZATION WITH CORONARY ANGIOGRAM N/A 09/25/2011   Procedure: LEFT HEART CATHETERIZATION WITH CORONARY ANGIOGRAM;  Surgeon: Jerel Balding, MD;  [facility] CATH [facility];  Service: Cardiovascular;  Laterality: N/A;   multiple bladder surgeries to remove mesh     ROTATOR CUFF REPAIR Right    stent to groin Left 08/2014   left leg   TENDON REPAIR Right 07/10/2017   Procedure: RIGHT PERONEAL TENDON REPAIR;  Surgeon: Gretel Ozell PARAS, DPM;  [facility];  Service: Podiatry;  Laterality: Right;   TENDON REPAIR Right 05/27/2019   Procedure: PERONEAL TENDON REPAIR x2;  Surgeon: Gretel Ozell PARAS, DPM;  [facility];  Service: Podiatry;  Laterality: Right;   Family History  Problem Relation Age of Onset   Cancer Mother    Heart disease Father    Heart disease Sister    High blood pressure Sister    Heart disease Sister    Cancer Sister    Heart disease Brother    Diabetes Brother    Brain cancer Brother    High blood pressure Brother    Heart disease Brother    Heart attack Brother    Heart disease Brother    High blood pressure Brother    Social History   Occupational History   Not on file  Tobacco Use   Smoking status: Never   Smokeless tobacco: Never  Vaping Use   Vaping status: Never Used  Substance and Sexual Activity   Alcohol use: No   Drug use: No   Sexual activity: Not on file   Tobacco Counseling Counseling given: Not Answered  SDOH Screenings   Food Insecurity: Low Risk  (05/08/2024)   Received from [facility]  Housing: Low Risk  (05/08/2024)   Received from [facility]  Transportation Needs: No Transportation Needs (05/08/2024)   Received from [facility]  Utilities: Low Risk  (05/08/2024)   Received from [facility]  Depression (PHQ2-9): Low Risk  (07/20/2024)  Social Connections: Unknown (11/13/2023)  Stress: No Stress Concern Present (07/20/2024)  Tobacco Use: Low Risk  (07/20/2024)  Health  Literacy: Inadequate Health Literacy (07/26/2024)   Depression Screen    07/20/2024   10:17 AM 03/25/2024   10:55 AM 12/06/2023    9:46 AM 11/19/2023   10:32 AM 11/08/2023   12:49 PM 09/20/2023   10:31 AM 08/08/2023    9:22 AM  PHQ 2/9 Scores  PHQ - 2 Score 0 0 0 0 0 0 0     Goals Addressed             This Visit's Progress    Patient Stated       Stay healthy        Visit info / Clinical Intake: Medicare Wellness Visit Type:: Subsequent Annual Wellness Visit Medicare Wellness Visit Mode:: In-person (required for WTM) Interpreter Needed?: No Pre-visit prep was completed: no AWV questionnaire completed by patient prior to visit?: no Living arrangements:: lives with spouse/significant other Patient's Overall Health Status Rating: good  Typical amount of pain: some Does pain affect daily life?: (!) yes Are you currently prescribed opioids?: no  Dietary Habits and Nutritional Risks How many meals a day?: 3 (sometimes snacks in between) Eats fruit and vegetables daily?: yes Most meals are obtained by: having others provide food (meals on wheels and fix sometimes) Diabetic:: no  Functional Status Activities of Daily Living (to include ambulation/medication): (!) Needs Assist Feeding: Independent Dressing/Grooming: Independent Bathing: Independent Toileting: Independent Transfer: Independent Ambulation: Independent with device- listed below Home Assistive Devices/Equipment: Walker (specify Type); Beside Commode Medication Administration: Needs assistance (comment) Is this a change from baseline?: Pre-admission baseline Home Management: Needs assistance (comment) Manage your own finances?: yes (son assist) Primary transportation is: facility / other Concerns about vision?: no *vision screening is required for WTM* Concerns about hearing?: (!) yes Uses hearing aids?: no Hear whispered voice?: (!) no *in-person visit only*  Fall Screening Falls in the past year?:  0 Number of falls in past year: 0 Was there an injury with Fall?: 0 Fall Risk Category Calculator: 0 Patient Fall Risk Level: Low Fall Risk  Fall Risk Patient at Risk for Falls Due to: History of fall(s) Fall risk Follow up: Falls evaluation completed  Home and Transportation Safety: All rugs have non-skid backing?: yes All stairs or steps have railings?: yes Grab bars in the bathtub or shower?: (!) no Have non-skid surface in bathtub or shower?: yes Good home lighting?: yes Regular seat belt use?: yes Hospital stays in the last year:: (!) yes How many hospital stays:: 1 Reason: Generalized weakness  Cognitive Assessment Difficulty concentrating, remembering, or making decisions? : yes (Remembering) What year is it?: 0 points What month is it?: 0 points Give patient an address phrase to remember (5 components): [address] [place]  About what time is it?: 0 points Count backwards from 20 to 1: 0 points Say the months of the year in reverse: 0 points Repeat the address phrase from earlier: 4 points 6 CIT Score: 4 points  Advance Directives (For Healthcare) Does Patient Have a Medical Advance Directive?: No Does patient want to make changes to medical advance directive?: No - Patient declined Type of Advance Directive: Healthcare Power of [place]; Living will Copy of Healthcare Power of Attorney in Chart?: No - copy requested Copy of Living Will in Chart?: No - copy requested Would patient like information on creating a medical advance directive?: No - Patient declined        Objective:    There were no vitals filed for this visit. There is no height or weight on file to calculate BMI.  Current Medications (verified) Outpatient Encounter Medications as of 07/20/2024  Medication Sig   amLODipine  (NORVASC ) 2.5 MG tablet Take 1 tablet (2.5 mg total) by mouth daily.   cyanocobalamin  (VITAMIN B12) 1000 MCG tablet Take 1 tablet (1,000 mcg total) by mouth  daily.   ezetimibe  (ZETIA ) 10 MG tablet Take 1 tablet (10 mg total) by mouth every morning.   gabapentin  (NEURONTIN ) 300 MG capsule Take 1 capsule (300 mg total) by mouth at bedtime.   latanoprost  (XALATAN ) 0.005 % ophthalmic solution Place 1 drop into both eyes at bedtime.   levothyroxine  (SYNTHROID ) 75 MCG tablet Take 1 tablet (75 mcg total) by mouth daily before breakfast.   Nutritional Supplements (ENSURE HIGH PROTEIN) LIQD Take 1 Can by mouth daily.   nystatin  cream (MYCOSTATIN ) Apply 1 Application topically 2 (two) times daily. Apply to vaginal and groin folds twice daily until resolved.  OVER THE COUNTER MEDICATION Take 1 capsule by mouth daily. OmegaXL joint and muscle support   potassium chloride  SA (KLOR-CON  M) 20 MEQ tablet Take 2 tablets by mouth in the morning and 1 tablet in the afternoon   pravastatin  (PRAVACHOL ) 80 MG tablet Take 1 tablet (80 mg total) by mouth at bedtime.   Psyllium 28.3 % POWD Follow Package Directions.   sertraline  (ZOLOFT ) 25 MG tablet Take 1 tablet (25 mg total) by mouth daily.   torsemide  (DEMADEX ) 20 MG tablet Take 2 tablets (40 mg total) by mouth daily.   nystatin  cream (MYCOSTATIN ) Apply 1 Application topically 2 (two) times daily. Apply to affected area on abdominal folds and peri-area (Patient not taking: Reported on 07/20/2024)   No facility-administered encounter medications on file as of 07/20/2024.   Hearing/Vision screen Hearing Screening - Comments:: Some hearing issues. Vision Screening - Comments:: Eye exam [facility] Last month  Immunizations and Health Maintenance Health Maintenance  Topic Date Due   Influenza Vaccine  12/15/2024 (Originally 04/17/2024)   Medicare Annual Wellness (AWV)  07/20/2025   DTaP/Tdap/Td (3 - Td or Tdap) 11/12/2032   Pneumococcal Vaccine: 50+ Years  Completed   DEXA SCAN  Completed   Meningococcal B Vaccine  Aged Out   COVID-19 Vaccine  Discontinued   Zoster Vaccines- Shingrix  Discontinued         Assessment/Plan:  This is a routine wellness examination for [place].  Patient Care Team: Mardell Cragg, Roxan BROCKS, NP as PCP - General (Family Medicine) Croitoru, Jerel, MD as PCP - Cardiology (Cardiology) Francyne Jerel, MD as Consulting Physician (Cardiology) Ivin Kocher, MD as Referring Physician (Dermatology) Gretel Ozell PARAS, DPM (Inactive) as Consulting Physician (Podiatry) Oman, [name], OD (Optometry)  I have personally reviewed and noted the following in the patient's chart:   Medical and social history Use of alcohol, tobacco or illicit drugs  Current medications and supplements including opioid prescriptions. Functional ability and status Nutritional status Physical activity Advanced directives List of other physicians Hospitalizations, surgeries, and ER visits in previous 12 months Vitals Screenings to include cognitive, depression, and falls Referrals and appointments  No orders of the defined types were placed in this encounter.  In addition, I have reviewed and discussed with patient certain preventive protocols, quality metrics, and best practice recommendations. A written personalized care plan for preventive services as well as general preventive health recommendations were provided to patient.   Roxan BROCKS Charly Holcomb, NP   07/26/2024   Return in 1 year (on 07/20/2025) for Annual wellness visit.  After Visit Summary: (In Person-Printed) AVS printed and given to the patient  Nurse Notes: Declined Influenza vaccine due to allergies

## 2024-07-25 NOTE — Progress Notes (Unsigned)
 Jill Preston - 89 y.o. female MRN [id number]  Date of birth: 1933-01-05  Office Visit Note: Visit Date: 07/27/2024 PCP: Leonarda Roxan BROCKS, NP Referred by: Leonarda Roxan BROCKS, NP  Subjective: No chief complaint on file.  HPI: Jill Preston is a pleasant 89 y.o. female who presents today for follow-up of ongoing right thumb basal joint pain and associated thumb trigger digit.  Underwent injection to the right thumb trigger digit at her most recent visit with notable relief of her symptoms.  Does have ongoing soreness and pain at the basilar aspect of the thumb and wrist region with radiation into the forearm.  She has well-known CMC arthritis of the right basilar joint of the thumb.  She is here today for specific hand surgical evaluation.  Pertinent ROS were reviewed with the patient and found to be negative unless otherwise specified above in HPI.      Assessment & Plan: Visit Diagnoses:  1. Arthritis of carpometacarpal (CMC) joint of right thumb      Plan: We discussed at length her right thumb basilar joint pain today.  Previous x-rays were reviewed which do show significant arthritic change at the thumb CMC interval.  We discussed conservative versus surgical treatment modalities.  She does have prior thumb spica braces which she can continue utilizing as needed.  States that she has undergone prior injection with minimal relief in the future and is not interested in repeat injection today.  I did discuss with her the possibility of [facility] arthroplasty in the future should her symptoms continue to progress.  She did express interest in this procedure potentially in the future, however she does have some ongoing family obligations that she needs to attend to.  She will follow-up as needed moving forward.  Follow-up: No follow-ups on file.   Meds & Orders: No orders of the defined types were placed in this encounter.   No orders of the defined types were placed in this  encounter.    Procedures: No procedures performed      Clinical History: No specialty comments available.  She reports that she has never smoked. She has never used smokeless tobacco. No results for input(s): HGBA1C, LABURIC in the last 8760 hours.  Objective:   Vital Signs: There were no vitals taken for this visit.  Physical Exam  Gen: Well-appearing, in no acute distress; non-toxic CV: Regular Rate. Well-perfused. Warm.  Resp: Breathing unlabored on room air; no wheezing. Psych: Fluid speech in conversation; appropriate affect; normal thought process  Ortho Exam General: Patient is well appearing and in no distress.    Skin and Muscle: No skin changes are apparent to upper extremities.    Range of Motion and Palpation Tests: Significant tenderness over the bilateral thumb CMC articulation is observed, positive grind for pain, positive crepitus.   Finklestein test negative bilateral  Right thumb with appropriate range of motion of the IP joint, no residual clicking or locking   Neurologic, Vascular, Motor: Sensation is intact to light touch in the median/radial/ulnar distributions. Fingers pink and well perfused.  Capillary refill is brisk.     Imaging: No results found.   Past Medical/Family/Surgical/Social History: Medications & Allergies reviewed per EMR, new medications updated. Patient Active Problem List   Diagnosis Date Noted   Vertigo 11/15/2023   Confusion 11/15/2023   Hypomagnesemia 11/13/2023   Orthostatic hypotension 11/13/2023   Frequent falls 11/12/2023   Hyponatremia 11/12/2023   Concussion 11/13/2022   Head injury due to trauma  11/12/2022   Fall 10/25/2022   Sick sinus syndrome (HCC) 03/07/2022   Hordeolum externum (stye) 05/16/2021   Chronic otitis media with serous effusion 05/16/2021   Thrombocytopenia 04/17/2021   PAD (peripheral artery disease) 08/01/2018   Herniated intervertebral disc of lumbar spine 06/02/2018   Bilateral leg  pain 01/27/2017   Cervical spine degeneration 10/23/2015   Hypersomnolence 10/23/2015   S/P IVC filter 02/15/2015   Angina pectoris 01/26/2015   CAD in native artery    Ischemic chest pain    Edema of left lower extremity 09/16/2014   Acute deep vein thrombosis of left lower extremity (HCC) 09/14/2014   Anemia associated with acute blood loss 09/14/2014   Subclavian artery stenosis, left 08/04/2014   Chronic diastolic CHF (congestive heart failure) (HCC) 07/16/2014   Essential hypertension 07/08/2014   CKD (chronic kidney disease), stage III (HCC)    Hypertensive heart disease    Chronic venous insufficiency    Metatarsal deformity 06/24/2013   CAD - LAD stent 12/00. Patent '02 and 1/13, moderate D2 managed medically 02/26/2013   Dyslipidemia 02/26/2013   DJD (degenerative joint disease)- back 02/26/2013   Asthma 08/06/2012   Hyperlipidemia 08/05/2012   Lichen sclerosus 06/03/2012   Rectocele 06/03/2012   Recurrent UTI 06/03/2012   Urge urinary incontinence 06/03/2012   Voiding dysfunction 06/03/2012   Hypothyroidism 07/23/2011   Past Medical History:  Diagnosis Date   Arthritis    right knee   Cancer (HCC) yrs ago   skin cancer removed from face   Chronic diastolic CHF (congestive heart failure) (HCC)    Chronic venous insufficiency    LEA VENOUS, 10/17/2011 - mild reflux in bilateral common femoral veins   CKD (chronic kidney disease), stage III (HCC)    Coronary artery disease    a. s/p PCI/BMS to prox LAD and balloon angioplasty to mLAD with suboptimal result in 2000. b. Abnl nuc 2012, cath 09/2011 - showed that the overall territory of potential ischemia was small and attributable to a moderately diseased small second diagonal artery. Med rx.   Dyspnea    with activity   Headache    History of blood transfusion 2017   Hyperlipidemia    Hypertension    Hypertensive heart disease    Hypothyroidism    Obesity    Pneumonia    several times   Stroke [facility])    pt.  states she had light stroke in sept. 1980   Urinary incontinence    Family History  Problem Relation Age of Onset   Cancer Mother    Heart disease Father    Heart disease Sister    High blood pressure Sister    Heart disease Sister    Cancer Sister    Heart disease Brother    Diabetes Brother    Brain cancer Brother    High blood pressure Brother    Heart disease Brother    Heart attack Brother    Heart disease Brother    High blood pressure Brother    Past Surgical History:  Procedure Laterality Date   ABDOMINAL HYSTERECTOMY  1970's   complete   ANKLE ARTHROSCOPY Right 07/10/2017   Procedure: ANKLE ARTHROSCOPY;  Surgeon: Gretel Ozell PARAS, DPM;  [facility];  Service: Podiatry;  Laterality: Right;   BACK SURGERY  07/26/2016   cervical neck surgery, [facility]   BALLOON DILATION N/A 12/01/2021   Procedure: BALLOON DILATION;  Surgeon: Rollin Dover, MD;  [facility];  Service: Gastroenterology;  Laterality: N/A;   BLADDER SURGERY  2010   WITH MESH  bladder tach   bunion removal surgery Bilateral 15 yrs ago   CARDIAC CATHETERIZATION Left 09/25/2011   Medical management   CARDIAC CATHETERIZATION Left 03/25/2001   Normal LV function, LAD residual narrowing of less than 10%, normal ramus intermediate, circumflex, and RCA,    CARDIAC CATHETERIZATION  09/04/1999   LAD, 3x36mm Tetra stent resulting in a reduction of the 80% stenosis to 0% residual   CARDIAC CATHETERIZATION N/A 01/26/2015   Procedure: Right/Left Heart Cath and Coronary Angiography;  Surgeon: Debby DELENA Sor, MD;  [facility] INVAS[facility] CV LAB;  Service: Cardiovascular;  Laterality: N/A;   CARDIAC CATHETERIZATION N/A 01/27/2015   Procedure: Intravascular Pressure Wire/FFR Study;  Surgeon: Lonni JONETTA Cash, MD;  Location: [facility] INVASIVE CV LAB;  Service: Cardiovascular;  Laterality: N/A;   CARDIAC CATHETERIZATION N/A 01/27/2015   Procedure: Right Heart Cath;  Surgeon: Lonni JONETTA Cash,  MD;  Location: [facility] INVASIVE CV LAB;  Service: Cardiovascular;  Laterality: N/A;   CHOLECYSTECTOMY     COLONOSCOPY WITH PROPOFOL  N/A 03/07/2017   Procedure: COLONOSCOPY WITH PROPOFOL ;  Surgeon: Kristie Lamprey, MD;  [facility];  Service: Endoscopy;  Laterality: N/A;   CORONARY PRESSURE/FFR STUDY N/A 04/05/2017   Procedure: Intravascular Pressure Wire/FFR Study;  Surgeon: Jordan, Peter M, MD;  Location: [facility] INVASIVE CV LAB;  Service: Cardiovascular;  Laterality: N/A;   CORONARY STENT PLACEMENT     LAD x 1   ESOPHAGOGASTRODUODENOSCOPY (EGD) WITH PROPOFOL  N/A 12/01/2021   Procedure: ESOPHAGOGASTRODUODENOSCOPY (EGD) WITH PROPOFOL ;  Surgeon: Rollin Dover, MD;  [facility];  Service: Gastroenterology;  Laterality: N/A;   ganglion cyst removal  yrs ago   x 2   ILIAC VEIN ANGIOPLASTY / STENTING  02/15/2015   IVC FILTER INSERTION  2016   IVC FILTER REMOVAL  02/15/2015   at [facility]   LEFT HEART CATH AND CORONARY ANGIOGRAPHY N/A 04/05/2017   Procedure: Left Heart Cath and Coronary Angiography;  Surgeon: Jordan, Peter M, MD;  Location: [facility] INVASIVE CV LAB;  Service: Cardiovascular;  Laterality: N/A;   LEFT HEART CATHETERIZATION WITH CORONARY ANGIOGRAM N/A 09/25/2011   Procedure: LEFT HEART CATHETERIZATION WITH CORONARY ANGIOGRAM;  Surgeon: Jerel Balding, MD;  [facility] C[facility];  Service: Cardiovascular;  Laterality: N/A;   multiple bladder surgeries to remove mesh     ROTATOR CUFF REPAIR Right    stent to groin Left 08/2014   left leg   TENDON REPAIR Right 07/10/2017   Procedure: RIGHT PERONEAL TENDON REPAIR;  Surgeon: Gretel Ozell PARAS, DPM;  [facility];  Service: Podiatry;  Laterality: Right;   TENDON REPAIR Right 05/27/2019   Procedure: PERONEAL TENDON REPAIR x2;  Surgeon: Gretel Ozell PARAS, DPM;  [facility];  Service: Podiatry;  Laterality: Right;   Social History   Occupational History   Not on file  Tobacco Use   Smoking status: Never   Smokeless tobacco: Never   Vaping Use   Vaping status: Never Used  Substance and Sexual Activity   Alcohol use: No   Drug use: No   Sexual activity: Not on file    Gitty Osterlund Afton Alderton, M.D.  [facility]

## 2024-07-27 ENCOUNTER — Ambulatory Visit: Admitting: Orthopedic Surgery

## 2024-07-27 DIAGNOSIS — M1811 Unilateral primary osteoarthritis of first carpometacarpal joint, right hand: Secondary | ICD-10-CM | POA: Diagnosis not present

## 2024-08-12 ENCOUNTER — Telehealth: Payer: Self-pay | Admitting: Cardiovascular Disease

## 2024-08-12 NOTE — Telephone Encounter (Signed)
 Pt c/o swelling/edema: STAT if pt has developed SOB within 24 hours  If swelling, where is the swelling located? Swelling in legs  How much weight have you gained and in what time span? Yes, five pounds in two days   Have you gained 2 pounds in a day or 5 pounds in a week? Yes   Do you have a log of your daily weights (if so, list)? Yes   Are you currently taking a fluid pill? Yes   Are you currently SOB? No   Have you traveled recently in a car or plane for an extended period of time? No    Nou, Nurse Case Manager with [employer], calling on behalf of the patient. Pt has been experiencing irregular weight gain and has some swelling in lower legs. Nou is requesting c/b go to patient.   Please advise.

## 2024-08-12 NOTE — Telephone Encounter (Signed)
 Call to patient to discuss symptoms per request of Nou, nurse case manager with [employer]. Patient has visit with Dr. Francyne scheduled for next week, 08/21/24 (Friday). No answer, left detailed message per DPR asking patient to call our office to discuss symptoms and how much torsemide  she has taken today. Also advised patient our office would be closed tomorrow and Friday and to go to ER or urgent care for evaluation if she feels her leg swelling is worsening or if she develops SOB.

## 2024-08-17 ENCOUNTER — Other Ambulatory Visit: Payer: Self-pay | Admitting: Cardiovascular Disease

## 2024-08-17 ENCOUNTER — Other Ambulatory Visit (HOSPITAL_COMMUNITY): Payer: Self-pay

## 2024-08-17 ENCOUNTER — Other Ambulatory Visit: Payer: Self-pay

## 2024-08-17 DIAGNOSIS — I10 Essential (primary) hypertension: Secondary | ICD-10-CM

## 2024-08-17 MED ORDER — AMLODIPINE BESYLATE 2.5 MG PO TABS
2.5000 mg | ORAL_TABLET | Freq: Every day | ORAL | 1 refills | Status: AC
  Filled 2024-08-17: qty 90, 90d supply, fill #0

## 2024-08-21 ENCOUNTER — Other Ambulatory Visit (HOSPITAL_COMMUNITY): Payer: Self-pay

## 2024-08-21 ENCOUNTER — Encounter: Payer: Self-pay | Admitting: Cardiovascular Disease

## 2024-08-21 ENCOUNTER — Ambulatory Visit: Attending: Cardiovascular Disease | Admitting: Cardiovascular Disease

## 2024-08-21 VITALS — BP 124/42 | HR 48 | Ht 65.5 in | Wt 187.2 lb

## 2024-08-21 DIAGNOSIS — E663 Overweight: Secondary | ICD-10-CM | POA: Diagnosis not present

## 2024-08-21 DIAGNOSIS — N182 Chronic kidney disease, stage 2 (mild): Secondary | ICD-10-CM | POA: Diagnosis not present

## 2024-08-21 DIAGNOSIS — I5032 Chronic diastolic (congestive) heart failure: Secondary | ICD-10-CM

## 2024-08-21 DIAGNOSIS — I2510 Atherosclerotic heart disease of native coronary artery without angina pectoris: Secondary | ICD-10-CM

## 2024-08-21 DIAGNOSIS — E782 Mixed hyperlipidemia: Secondary | ICD-10-CM

## 2024-08-21 DIAGNOSIS — I10 Essential (primary) hypertension: Secondary | ICD-10-CM | POA: Diagnosis not present

## 2024-08-21 MED ORDER — POTASSIUM CHLORIDE CRYS ER 20 MEQ PO TBCR
EXTENDED_RELEASE_TABLET | ORAL | 3 refills | Status: AC
  Filled 2024-08-21: qty 300, fill #0

## 2024-08-21 MED ORDER — MAGNESIUM OXIDE 400 MG PO TABS: 400.0000 mg | ORAL_TABLET | Freq: Every day | ORAL | Status: AC

## 2024-08-21 MED ORDER — TORSEMIDE 20 MG PO TABS
ORAL_TABLET | ORAL | 3 refills | Status: AC
  Filled 2024-08-21: qty 60, 30d supply, fill #0
  Filled 2024-08-22: qty 300, 30d supply, fill #0
  Filled 2024-08-26: qty 300, 17d supply, fill #0
  Filled 2024-08-27: qty 300, 30d supply, fill #0
  Filled 2024-09-19: qty 300, 30d supply, fill #1
  Filled 2024-10-19: qty 300, 30d supply, fill #2

## 2024-08-21 NOTE — Patient Instructions (Addendum)
 Medication Instructions:  Torsemide - 40 mg every Monday, Wednesday, and Friday 60 mg every Tuesday, Thursday, Saturday, and Sunday  On the days that you take 60 mg of Torsemide , you will take an extra 20 meq of Potassium= 2 tablets in the morning and 2 in the afternoon  Magnesium  Oxide 400 mg daily  *If you need a refill on your cardiac medications before your next appointment, please call your pharmacy*  Lab Work: None ordered If you have labs (blood work) drawn today and your tests are completely normal, you will receive your results only by: MyChart Message (if you have MyChart) OR A paper copy in the mail If you have any lab test that is abnormal or we need to change your treatment, we will call you to review the results.  Testing/Procedures: None ordered  Follow-Up: At [facility], you and your health needs are our priority.  As part of our continuing mission to provide you with exceptional heart care, our providers are all part of one team.  This team includes your primary Cardiologist (physician) and Advanced Practice Providers or APPs (Physician Assistants and Nurse Practitioners) who all work together to provide you with the care you need, when you need it.  Your next appointment:   6 month(s)  Provider:   Jerel Balding, MD    We recommend signing up for the patient portal called MyChart.  Sign up information is provided on this After Visit Summary.  MyChart is used to connect with patients for Virtual Visits (Telemedicine).  Patients are able to view lab/test results, encounter notes, upcoming appointments, etc.  Non-urgent messages can be sent to your provider as well.   To learn more about what you can do with MyChart, go to [email or web address].

## 2024-08-21 NOTE — Progress Notes (Unsigned)
 Patient ID: Jill Preston, female   DOB: March 31, 1933, 89 y.o.   MRN: [id number]    Cardiology Office Note    Date:  08/23/2024   ID:  Jill Preston, DOB June 07, 1933, MRN [id number]  PCP:  Leonarda Roxan BROCKS, NP  Cardiologist:   Jerel Balding, MD   Chief Complaint  Patient presents with   Coronary Artery Disease   Congestive Heart Failure     History of Present Illness:  [name] is a 89 y.o. female returning in follow-up for CAD and CHF.  Has a moderate stenosis of the left main coronary artery, not  hemodynamically significant by FFR analysis in 2016 and again in 2018.  She has chronic diastolic heart failure that responds well to diuretics, but excessive diuresis has occasionally led to acute kidney injury and severe hypokalemia.  She was hospitalized in late February for orthostatic hypotension in the setting of hypovolemia following an upper respiratory tract infection.  Discharge weight was 188 pounds, substantially higher than our previously estimated dry weight.  Since then she has not had major health issues.  Today her weight is 187 pounds.  She does not have a lot of edema and despite being sedentary does not appear to have significant shortness of breath.  She has not had any falls.  Edema is more obvious towards the end of the day but is limited to the ankles.  Biggest problem remains her husband Bob's worsening dementia, but they have a daytime caregiver that has made a big difference for both of them.  Metabolic parameters from earlier this year are all in target range with LDL cholesterol 45, HDL 63 and creatinine 0.87 and potassium 3.7.  Her BNP in March was 238, after her hospitalization.  Over the last 3-4 years for BNP has been in the 79-230 range.  Her echocardiogram performed 11/13/2023 showed LVEF 55-60% and pseudonormalization of the mitral inflow, moderately dilated left atrium, no significant valvular abnormalities.  Her biggest problem remains the  stress surrounding caring for her husband Octaviano and his progressive dementia.  This has been compounded by recurrent falls, spinal fracture, recent prolonged hospitalization with urinary tract infection related sepsis, DVT/PE and development of sacral ulcers with a prolonged stay in a nursing home.  He is now back home  .  She has a long history of coronary disease. She underwent proximal LAD bare-metal stenting and balloon angioplasty of the mid LAD in 2013. She has had persistent ischemia in the territory of the diagonal artery on nuclear stress test performed over the last few years. Coronary angiography performed May 2016 showed a widely patent LAD stent, 60% ostial circumflex stenosis, 20-30% right coronary artery stenosis. There was concern about possible left coronary artery stenosis but fractional flow reserve was normal (0.96 at baseline, 0.91 during intravenous adenosine  infusion). She had good anginal response to Ranexa  but developed severe constipation and could not tolerate the medication. She has been intolerant to beta blockers due to bradycardia. She has preserved left ventricular systolic function but has had episodes of acute exacerbation of diastolic heart failure attributed to hypertensive heart disease. December 2015, she was critically ill with an acute left iliofemoral DVT with anticoagulation complicated by retroperitoneal hematoma and hemorrhagic shock, requiring placement of an inferior vena cava filter. The filter was removed in June 2016. Coronary angiography in July 2018 showed unchanged anatomy of the eccentric stenosis in the left main coronary artery with noncritical FFR (0.89 in the LAD, 0.86 in the LCx).  Intravascular Pressure Wire/FFR Study  Left Heart Cath and Coronary Angiography  Conclusion     LM lesion, 70 %stenosed. Mid LAD lesion, 0 %stenosed at site of prior stent. Ost Cx lesion, 60 %stenosed. Prox RCA-1 lesion, 20 %stenosed. Prox RCA-2 lesion, 30  %stenosed. The left ventricular systolic function is normal. LV end diastolic pressure is normal. The left ventricular ejection fraction is 55-65% by visual estimate.   1. 70% eccentric distal left main stenosis. Angiographically similar to 2016. FFR 0.89 into the LAD and 0.86 into the LCx suggesting that this lesion is not flow limiting.  2. Otherwise nonobstructive CAD. Patent stent in LAD. 3. Normal LV function 4. Normal LVEDP   Plan: recommend continued medical therapy.     Past Medical History:  Diagnosis Date   Arthritis    right knee   Cancer (HCC) yrs ago   skin cancer removed from face   Chronic diastolic CHF (congestive heart failure) (HCC)    Chronic venous insufficiency    LEA VENOUS, 10/17/2011 - mild reflux in bilateral common femoral veins   CKD (chronic kidney disease), stage III (HCC)    Coronary artery disease    a. s/p PCI/BMS to prox LAD and balloon angioplasty to mLAD with suboptimal result in 2000. b. Abnl nuc 2012, cath 09/2011 - showed that the overall territory of potential ischemia was small and attributable to a moderately diseased small second diagonal artery. Med rx.   Dyspnea    with activity   Headache    History of blood transfusion 2017   Hyperlipidemia    Hypertension    Hypertensive heart disease    Hypothyroidism    Obesity    Pneumonia    several times   Stroke [facility])    pt. states she had light stroke in sept. 1980   Urinary incontinence     Past Surgical History:  Procedure Laterality Date   ABDOMINAL HYSTERECTOMY  1970's   complete   ANKLE ARTHROSCOPY Right 07/10/2017   Procedure: ANKLE ARTHROSCOPY;  Surgeon: Gretel Ozell PARAS, DPM;  Location: [facility] OR;  Service: Podiatry;  Laterality: Right;   BACK SURGERY  07/26/2016   cervical neck surgery, [facility]   BALLOON DILATION N/A 12/01/2021   Procedure: BALLOON DILATION;  Surgeon: Rollin Dover, MD;  Location: [facility] END[facility];  Service: Gastroenterology;  Laterality: N/A;    BLADDER SURGERY  2010   WITH MESH  bladder tach   bunion removal surgery Bilateral 15 yrs ago   CARDIAC CATHETERIZATION Left 09/25/2011   Medical management   CARDIAC CATHETERIZATION Left 03/25/2001   Normal LV function, LAD residual narrowing of less than 10%, normal ramus intermediate, circumflex, and RCA,    CARDIAC CATHETERIZATION  09/04/1999   LAD, 3x70mm Tetra stent resulting in a reduction of the 80% stenosis to 0% residual   CARDIAC CATHETERIZATION N/A 01/26/2015   Procedure: Right/Left Heart Cath and Coronary Angiography;  Surgeon: Debby DELENA Sor, MD;  Location: [facility] INVASIVE CV LAB;  Service: Cardiovascular;  Laterality: N/A;   CARDIAC CATHETERIZATION N/A 01/27/2015   Procedure: Intravascular Pressure Wire/FFR Study;  Surgeon: Lonni JONETTA Cash, MD;  Location: [facility] INVASIVE CV LAB;  Service: Cardiovascular;  Laterality: N/A;   CARDIAC CATHETERIZATION N/A 01/27/2015   Procedure: Right Heart Cath;  Surgeon: Lonni JONETTA Cash, MD;  Location: [facility] CV LAB;  Service: Cardiovascular;  Laterality: N/A;   CHOLECYSTECTOMY     COLONOSCOPY WITH PROPOFOL  N/A 03/07/2017   Procedure: COLONOSCOPY WITH PROPOFOL ;  Surgeon: Kristie Lamprey,  MD;  Location: [facility];  Service: Endoscopy;  Laterality: N/A;   CORONARY PRESSURE/FFR STUDY N/A 04/05/2017   Procedure: Intravascular Pressure Wire/FFR Study;  Surgeon: Jordan, Peter M, MD;  Location: [facility] INVASIVE CV LAB;  Service: Cardiovascular;  Laterality: N/A;   CORONARY STENT PLACEMENT     LAD x 1   ESOPHAGOGASTRODUODENOSCOPY (EGD) WITH PROPOFOL  N/A 12/01/2021   Procedure: ESOPHAGOGASTRODUODENOSCOPY (EGD) WITH PROPOFOL ;  Surgeon: Rollin Dover, MD;  Location: [facility];  Service: Gastroenterology;  Laterality: N/A;   ganglion cyst removal  yrs ago   x 2   ILIAC VEIN ANGIOPLASTY / STENTING  02/15/2015   IVC FILTER INSERTION  2016   IVC FILTER REMOVAL  02/15/2015   at [facility]   LEFT HEART CATH AND CORONARY ANGIOGRAPHY N/A 04/05/2017   Procedure:  Left Heart Cath and Coronary Angiography;  Surgeon: Jordan, Peter M, MD;  Location: [facility] INVASIVE CV LAB;  Service: Cardiovascular;  Laterality: N/A;   LEFT HEART CATHETERIZATION WITH CORONARY ANGIOGRAM N/A 09/25/2011   Procedure: LEFT HEART CATHETERIZATION WITH CORONARY ANGIOGRAM;  Surgeon: Jerel Balding, MD;  Location: [facility] CATH LAB;  Service: Cardiovascular;  Laterality: N/A;   multiple bladder surgeries to remove mesh     ROTATOR CUFF REPAIR Right    stent to groin Left 08/2014   left leg   TENDON REPAIR Right 07/10/2017   Procedure: RIGHT PERONEAL TENDON REPAIR;  Surgeon: Gretel Ozell PARAS, DPM;  Location: [facility] OR;  Service: Podiatry;  Laterality: Right;   TENDON REPAIR Right 05/27/2019   Procedure: PERONEAL TENDON REPAIR x2;  Surgeon: Gretel Ozell PARAS, DPM;  Location: [facility] ORS;  Service: Podiatry;  Laterality: Right;    Outpatient Medications Prior to Visit  Medication Sig Dispense Refill   amLODipine  (NORVASC ) 2.5 MG tablet Take 1 tablet (2.5 mg total) by mouth daily. 90 tablet 1   cyanocobalamin  (VITAMIN B12) 1000 MCG tablet Take 1 tablet (1,000 mcg total) by mouth daily.     ezetimibe  (ZETIA ) 10 MG tablet Take 1 tablet (10 mg total) by mouth every morning. 90 tablet 3   gabapentin  (NEURONTIN ) 300 MG capsule Take 1 capsule (300 mg total) by mouth at bedtime. 90 capsule 1   latanoprost  (XALATAN ) 0.005 % ophthalmic solution Place 1 drop into both eyes at bedtime. 7.5 mL 3   levothyroxine  (SYNTHROID ) 75 MCG tablet Take 1 tablet (75 mcg total) by mouth daily before breakfast. 90 tablet 1   Nutritional Supplements (ENSURE HIGH PROTEIN) LIQD Take 1 Can by mouth daily.     nystatin  cream (MYCOSTATIN ) Apply 1 Application topically 2 (two) times daily. Apply to affected area on abdominal folds and peri-area 30 g 3   OVER THE COUNTER MEDICATION Take 1 capsule by mouth daily. OmegaXL joint and muscle support     pravastatin  (PRAVACHOL ) 80 MG tablet Take 1 tablet (80 mg total) by mouth at bedtime. 90 tablet  3   Psyllium 28.3 % POWD Follow Package Directions. 861 g 5   sertraline  (ZOLOFT ) 25 MG tablet Take 1 tablet (25 mg total) by mouth daily. 90 tablet 3   potassium chloride  SA (KLOR-CON  M) 20 MEQ tablet Take 2 tablets by mouth in the morning and 1 tablet in the afternoon 270 tablet 1   torsemide  (DEMADEX ) 20 MG tablet Take 2 tablets (40 mg total) by mouth daily. 180 tablet 1   nystatin  cream (MYCOSTATIN ) Apply 1 Application topically 2 (two) times daily. Apply to vaginal and groin folds twice daily until resolved. 30 g 2  No facility-administered medications prior to visit.     Allergies:   Atorvastatin, Penicillins, Shellfish allergy, Aminophylline , Iodinated contrast media, Latex, Oxybutynin chloride, Propoxyphene, Vancomycin , Adhesive [tape], Betadine [povidone iodine], Ciprofloxacin, Codeine , and Ranolazine    Family History:  The patient's family history includes Brain cancer in her brother; Cancer in her mother and sister; Diabetes in her brother; Heart attack in her brother; Heart disease in her brother, brother, brother, father, sister, and sister; High blood pressure in her brother, brother, and sister.   ROS:   Please see the history of present illness.    ROS All other systems are reviewed and are negative.   PHYSICAL EXAM:   VS:  BP (!) 124/42 (BP Location: Left Arm, Patient Position: Sitting, Cuff Size: Large)   Pulse (!) 48   Ht 5' 5.5 (1.664 m)   Wt 187 lb 3.2 oz (84.9 kg)   SpO2 92%   BMI 30.68 kg/m      General: Alert, oriented x3, no distress, borderline obese Head: no evidence of trauma, PERRL, EOMI, no exophtalmos or lid lag, no myxedema, no xanthelasma; normal ears, nose and oropharynx Neck: normal jugular venous pulsations and no hepatojugular reflux; brisk carotid pulses without delay and no carotid bruits Chest: clear to auscultation, no signs of consolidation by percussion or palpation, normal fremitus, symmetrical and full respiratory  excursions Cardiovascular: normal position and quality of the apical impulse, regular rhythm, normal first and second heart sounds, no murmurs, rubs or gallops Abdomen: no tenderness or distention, no masses by palpation, no abnormal pulsatility or arterial bruits, normal bowel sounds, no hepatosplenomegaly Extremities:  1+ symmetrical ankle and pedal edema Neurological: grossly nonfocal Psych: Normal mood and affect    Wt Readings from Last 3 Encounters:  08/21/24 187 lb 3.2 oz (84.9 kg)  04/29/24 182 lb (82.6 kg)  04/29/24 182 lb 9.6 oz (82.8 kg)      Studies/Labs Reviewed:   EKG:   EKG Interpretation Date/Time:  Friday August 21 2024 09:57:14 EST Ventricular Rate:  48 PR Interval:  200 QRS Duration:  92 QT Interval:  472 QTC Calculation: 421 R Axis:   28  Text Interpretation: Sinus bradycardia with Premature supraventricular complexes When compared with ECG of 20-Nov-2023 11:19, Premature supraventricular complexes are now Present Confirmed by Tyrail Grandfield ([id number]) on 08/21/2024 10:18:48 AM         Recent Labs: 09/20/2023: TSH 1.44 11/19/2023: Magnesium  1.8 11/20/2023: BNP 238.2 05/26/2024: ALT 15; BUN 12; Creatinine 0.87; Hemoglobin 14.1; Platelet Count 79; Potassium 3.7; Sodium 142   Lipid Panel    Component Value Date/Time   CHOL 122 09/20/2023 1118   CHOL 125 02/04/2023 1156   TRIG 59 09/20/2023 1118   HDL 63 09/20/2023 1118   HDL 64 02/04/2023 1156   CHOLHDL 1.9 09/20/2023 1118   VLDL 48 (H) 06/27/2015 1336   LDLCALC 45 09/20/2023 1118      Labs 03/23/2020 Total cholesterol 145, HDL 55, LDL 69, triglycerides 118 TSH 0.886  09/21/2020 Total cholesterol 122, HDL 56, LDL 47, triglycerides 101  09/05/2021 Cholesterol 136, HDL 69, LDL 51, triglycerides 78  93/78/7976 Cholesterol 139, HDL 71, LDL 52, triglycerides 82  87/80/7976 Cholesterol 162, HDL 52, LDL 87, triglycerides 131    CATH 04/05/2017  LM lesion, 70 %stenosed. Mid LAD lesion, 0  %stenosed at site of prior stent. Ost Cx lesion, 60 %stenosed. Prox RCA-1 lesion, 20 %stenosed. Prox RCA-2 lesion, 30 %stenosed. The left ventricular systolic function is normal. LV end diastolic pressure is normal.  The left ventricular ejection fraction is 55-65% by visual estimate.   1. 70% eccentric distal left main stenosis. Angiographically similar to 2016. FFR 0.89 into the LAD and 0.86 into the LCx suggesting that this lesion is not flow limiting.  2. Otherwise nonobstructive CAD. Patent stent in LAD. 3. Normal LV function 4. Normal LVEDP   Plan: recommend continued medical therapy.    ASSESSMENT:    1. Chronic heart failure with preserved ejection fraction (HFpEF) (HCC)   2. Coronary artery disease involving native coronary artery of native heart without angina pectoris   3. Essential hypertension   4. Mixed hyperlipidemia   5. Overweight (BMI 25.0-29.9)   6. CKD (chronic kidney disease) stage 2, GFR 60-89 ml/min         PLAN:  In order of problems listed above:  CHF: NYHA functional class II.  Clinically euvolemic.  At the upper limit of what we have estimated to be her dry weight (180-188 pounds; (our office scale overestimates this by about 5 pounds compared to her home scale).  Previous attempts at aggressive diuresis using metolazone  has led to acute kidney injury on a couple of occasions.  We have had to avoid SGLT2 inhibitors due to cost.  Baseline BNP seems to be 100-124, has been as high as 230 in the past and was around 230 earlier this year when she was not particular short of breath. CAD: It has been several years since she has complained of chest pain.  Her antianginal regimen currently consists of amlodipine  2.5 mg once daily as the only therapy (she is intolerant to beta-blockers due to bradycardia and ranolazine  caused severe constipation).  She has a remote history of stent to the LAD more than 10 years ago with a jailed diagonal that probably is  responsible for small area of anterolateral ischemia.   She has a known moderate stenosis in the left main coronary artery that was not significant by FFR performed as recently as 2018.  Nuclear stress testing would not be useful to evaluate this due to the likelihood of balanced ischemia.  If necessary, would follow this up with coronary CT angiography.  However I do not think she would be a candidate for bypass surgery anymore, with advancing age and worsening functional status.   HTN: After losing a lot of weight her blood pressure is normal.  In fact her diastolic blood pressure continues to be quite low.  She is on amlodipine  for antianginal benefit.  May have to discontinue this if she has symptomatic hypotension. Hyperlipidemia: Excellent lipid parameters on the current regimen (pravastatin  plus ezetimibe ).  She did not tolerate atorvastatin.   Overweight: Borderline obese based on current weight. CKD 2: Stable creatinine around 0.9-1.0. Neurological complaints: She had some complaints that might have represented a TIA earlier in 2023, but I wonder whether they were more related to anxiety and frustration.  No recent problems to suggest TIA or stroke.  Repeat carotid duplex ultrasound performed 11/03/2021 shows widely patent carotids and antegrade flow in the vertebral arteries bilaterally, without significant plaque . Family/social stressors: Bob's dementia has had serious impact on his own physical health and on her emotional health.  She is doing the best she can and the situation has improved since she has a full-time caregiver during the day.  She does have some home nursing visits that are helping; she does not want him to go to a nursing home.   Medication Adjustments/Labs and Tests Ordered: Current medicines are reviewed  at length with the patient today.  Concerns regarding medicines are outlined above.  Medication changes, Labs and Tests ordered today are listed in the Patient Instructions  below. Patient Instructions  Medication Instructions:  Torsemide - 40 mg every Monday, Wednesday, and Friday 60 mg every Tuesday, Thursday, Saturday, and Sunday  On the days that you take 60 mg of Torsemide , you will take an extra 20 meq of Potassium= 2 tablets in the morning and 2 in the afternoon  Magnesium  Oxide 400 mg daily  *If you need a refill on your cardiac medications before your next appointment, please call your pharmacy*  Lab Work: None ordered If you have labs (blood work) drawn today and your tests are completely normal, you will receive your results only by: MyChart Message (if you have MyChart) OR A paper copy in the mail If you have any lab test that is abnormal or we need to change your treatment, we will call you to review the results.  Testing/Procedures: None ordered  Follow-Up: At [facility], you and your health needs are our priority.  As part of our continuing mission to provide you with exceptional heart care, our providers are all part of one team.  This team includes your primary Cardiologist (physician) and Advanced Practice Providers or APPs (Physician Assistants and Nurse Practitioners) who all work together to provide you with the care you need, when you need it.  Your next appointment:   6 month(s)  Provider:   Jerel Balding, MD    We recommend signing up for the patient portal called MyChart.  Sign up information is provided on this After Visit Summary.  MyChart is used to connect with patients for Virtual Visits (Telemedicine).  Patients are able to view lab/test results, encounter notes, upcoming appointments, etc.  Non-urgent messages can be sent to your provider as well.   To learn more about what you can do with MyChart, go to [email or web address].          Signed, Jerel Balding, MD  08/23/2024 1:15 PM    [facility] [address] [place], [place], [place]  [address] Phone: [phone]; Fax: [phone]

## 2024-08-22 ENCOUNTER — Other Ambulatory Visit (HOSPITAL_COMMUNITY): Payer: Self-pay

## 2024-08-26 ENCOUNTER — Other Ambulatory Visit: Payer: Self-pay

## 2024-08-27 ENCOUNTER — Other Ambulatory Visit: Payer: Self-pay

## 2024-08-27 ENCOUNTER — Other Ambulatory Visit (HOSPITAL_COMMUNITY): Payer: Self-pay

## 2024-09-25 ENCOUNTER — Ambulatory Visit: Payer: Self-pay | Admitting: Family

## 2024-09-25 ENCOUNTER — Other Ambulatory Visit: Payer: Self-pay

## 2024-09-25 ENCOUNTER — Encounter: Payer: Self-pay | Admitting: Family

## 2024-09-25 VITALS — BP 122/60 | HR 56 | Temp 98.0°F | Resp 18 | Ht 65.5 in | Wt 188.2 lb

## 2024-09-25 DIAGNOSIS — E782 Mixed hyperlipidemia: Secondary | ICD-10-CM | POA: Diagnosis not present

## 2024-09-25 DIAGNOSIS — F411 Generalized anxiety disorder: Secondary | ICD-10-CM | POA: Diagnosis not present

## 2024-09-25 DIAGNOSIS — E039 Hypothyroidism, unspecified: Secondary | ICD-10-CM | POA: Diagnosis not present

## 2024-09-25 DIAGNOSIS — K5901 Slow transit constipation: Secondary | ICD-10-CM | POA: Diagnosis not present

## 2024-09-25 DIAGNOSIS — I739 Peripheral vascular disease, unspecified: Secondary | ICD-10-CM

## 2024-09-25 DIAGNOSIS — I10 Essential (primary) hypertension: Secondary | ICD-10-CM | POA: Diagnosis not present

## 2024-09-25 DIAGNOSIS — I5032 Chronic diastolic (congestive) heart failure: Secondary | ICD-10-CM | POA: Diagnosis not present

## 2024-09-25 MED ORDER — TRIAMCINOLONE ACETONIDE 0.1 % EX CREA
1.0000 | TOPICAL_CREAM | Freq: Every day | CUTANEOUS | 1 refills | Status: AC
  Filled 2024-09-25: qty 30, 30d supply, fill #0

## 2024-09-25 NOTE — Progress Notes (Signed)
 "  Provider: Lakendrick Paradis FNP-C   Tequan Redmon, Roxan BROCKS, NP  Patient Care Team: Seng Larch, Roxan BROCKS, NP as PCP - General (Family Medicine) Croitoru, Jerel, MD as PCP - Cardiology (Cardiology) Croitoru, Jerel, MD as Consulting Physician (Cardiology) Ivin Kocher, MD as Referring Physician (Dermatology) Gretel Ozell PARAS, DPM (Inactive) as Consulting Physician (Podiatry) Oman, [name], OD [facility])  Extended Emergency Contact Information Primary Emergency Contact: Creekmore,Lewis Danya [place], [place] Home Phone: [phone] Mobile Phone: [phone] Relation: Son Secondary Emergency Contact: [facility] Phone: [phone] Mobile Phone: [phone] Relation: Friend  Code Status: Full code Goals of care: Advanced Directive information    09/25/2024   11:03 AM  Advanced Directives  Does Patient Have a Medical Advance Directive? No  Would patient like information on creating a medical advance directive? No - Patient declined     Chief Complaint  Patient presents with   Medical Management of Chronic Issues    6 Month follow up.    History of Present Illness   Jill Preston is an 89 year old female with neuropathy who presents with nighttime foot numbness and cramps. She is accompanied by Wilburt Kava, who appears to have dementia.  She experiences numbness and cramps in both feet at night, requiring the use of a roll-on product for relief, sometimes twice a night. She is currently on gabapentin  300 mg at bedtime for neuropathy. She suspects the issue may be related to tendon problems, believing she might be broken.  Two years ago, she had a fall in February, resulting in a bone-on-bone condition in her right wrist, causing pain that sometimes radiates up her arm. She uses a roll-on product for pain relief, especially in the morning.  She experiences urinary incontinence, often unable to reach the bathroom in time, leading to wetting  the floor. She uses nighttime pads both day and night, changing them four to five times daily.  She reports shortness of breath and cramps when bending over. She monitors her weight at home.  She has a history of low platelets and reports epistaxis and bleeding from her gums, especially after a fall. She notices blood when blowing her nose and experiences sharp pain from her sinus area to her eye.  Her current medications include amlodipine  2.5 mg daily, Zetia  10 mg for cholesterol, gabapentin  300 mg at bedtime for neuropathy, levothyroxine  75 mg on an empty stomach, magnesium  400 mg, potassium pills, pravastatin , Zoloft  for anxiety and depression, and desonide as a fluid pill. She also uses nystatin  cream under her breast and a roll-on product for pain relief.  She receives Meals on Wheels and consumes a bowl of Cheerios every morning, adding Ensure to her coffee and cereal. She engages in activities like making pocketbooks out of plastic and yarn.    Past Medical History:  Diagnosis Date   Arthritis    right knee   Cancer (HCC) yrs ago   skin cancer removed from face   Chronic diastolic CHF (congestive heart failure) (HCC)    Chronic venous insufficiency    LEA VENOUS, 10/17/2011 - mild reflux in bilateral common femoral veins   CKD (chronic kidney disease), stage III (HCC)    Coronary artery disease    a. s/p PCI/BMS to prox LAD and balloon angioplasty to mLAD with suboptimal result in 2000. b. Abnl nuc 2012, cath 09/2011 - showed that the overall territory of potential ischemia was small and attributable to a  moderately diseased small second diagonal artery. Med rx.   Dyspnea    with activity   Headache    History of blood transfusion 2017   Hyperlipidemia    Hypertension    Hypertensive heart disease    Hypothyroidism    Obesity    Pneumonia    several times   Stroke [facility])    pt. states she had light stroke in sept. 1980   Urinary incontinence    Past Surgical History:   Procedure Laterality Date   ABDOMINAL HYSTERECTOMY  1970's   complete   ANKLE ARTHROSCOPY Right 07/10/2017   Procedure: ANKLE ARTHROSCOPY;  Surgeon: Gretel Ozell PARAS, DPM;  Location: [facility] OR;  Service: Podiatry;  Laterality: Right;   BACK SURGERY  07/26/2016   cervical neck surgery, [facility]   BALLOON DILATION N/A 12/01/2021   Procedure: BALLOON DILATION;  Surgeon: Rollin Dover, MD;  Location: [facility] ENDOSCOPY;  Service: Gastroenterology;  Laterality: N/A;   BLADDER SURGERY  2010   WITH MESH  bladder tach   bunion removal surgery Bilateral 15 yrs ago   CARDIAC CATHETERIZATION Left 09/25/2011   Medical management   CARDIAC CATHETERIZATION Left 03/25/2001   Normal LV function, LAD residual narrowing of less than 10%, normal ramus intermediate, circumflex, and RCA,    CARDIAC CATHETERIZATION  09/04/1999   LAD, 3x72mm Tetra stent resulting in a reduction of the 80% stenosis to 0% residual   CARDIAC CATHETERIZATION N/A 01/26/2015   Procedure: Right/Left Heart Cath and Coronary Angiography;  Surgeon: Debby DELENA Sor, MD;  Location: [facility] INVASIVE CV LAB;  Service: Cardiovascular;  Laterality: N/A;   CARDIAC CATHETERIZATION N/A 01/27/2015   Procedure: Intravascular Pressure Wire/FFR Study;  Surgeon: Lonni JONETTA Cash, MD;  Location: [facility] INVASIVE CV LAB;  Service: Cardiovascular;  Laterality: N/A;   CARDIAC CATHETERIZATION N/A 01/27/2015   Procedure: Right Heart Cath;  Surgeon: Lonni JONETTA Cash, MD;  Location: [facility] INVASIVE CV LAB;  Service: Cardiovascular;  Laterality: N/A;   CHOLECYSTECTOMY     COLONOSCOPY WITH PROPOFOL  N/A 03/07/2017   Procedure: COLONOSCOPY WITH PROPOFOL ;  Surgeon: Kristie Lamprey, MD;  Location: [facility];  Service: Endoscopy;  Laterality: N/A;   CORONARY PRESSURE/FFR STUDY N/A 04/05/2017   Procedure: Intravascular Pressure Wire/FFR Study;  Surgeon: Jordan, Peter M, MD;  Location: [facility] INVASIVE CV LAB;  Service: Cardiovascular;  Laterality: N/A;   CORONARY STENT  PLACEMENT     LAD x 1   ESOPHAGOGASTRODUODENOSCOPY (EGD) WITH PROPOFOL  N/A 12/01/2021   Procedure: ESOPHAGOGASTRODUODENOSCOPY (EGD) WITH PROPOFOL ;  Surgeon: Rollin Dover, MD;  Location: [facility];  Service: Gastroenterology;  Laterality: N/A;   ganglion cyst removal  yrs ago   x 2   ILIAC VEIN ANGIOPLASTY / STENTING  02/15/2015   IVC FILTER INSERTION  2016   IVC FILTER REMOVAL  02/15/2015   at [facility]   LEFT HEART CATH AND CORONARY ANGIOGRAPHY N/A 04/05/2017   Procedure: Left Heart Cath and Coronary Angiography;  Surgeon: Jordan, Peter M, MD;  Location: [facility] INVASIVE CV LAB;  Service: Cardiovascular;  Laterality: N/A;   LEFT HEART CATHETERIZATION WITH CORONARY ANGIOGRAM N/A 09/25/2011   Procedure: LEFT HEART CATHETERIZATION WITH CORONARY ANGIOGRAM;  Surgeon: Jerel Balding, MD;  Location: [facility] CATH LAB;  Service: Cardiovascular;  Laterality: N/A;   multiple bladder surgeries to remove mesh     ROTATOR CUFF REPAIR Right    stent to groin Left 08/2014   left leg   TENDON REPAIR Right 07/10/2017   Procedure: RIGHT PERONEAL TENDON REPAIR;  Surgeon:  Gretel Ozell PARAS, DPM;  Location: [facility] OR;  Service: Podiatry;  Laterality: Right;   TENDON REPAIR Right 05/27/2019   Procedure: PERONEAL TENDON REPAIR x2;  Surgeon: Gretel Ozell PARAS, DPM;  Location: [facility] ORS;  Service: Podiatry;  Laterality: Right;    Allergies[1]  Allergies as of 09/25/2024       Reactions   Atorvastatin Anaphylaxis, Other (See Comments)   Patient does not remember; caused pneumonia- took her off of it per patient   Penicillins Anaphylaxis, Hives, Swelling, Other (See Comments)   Tongue swelling Did it involve swelling of the face/tongue/throat, SOB, or low BP? Yes Did it involve sudden or severe rash/hives, skin peeling, or any reaction on the inside of your mouth or nose? Yes Did you need to seek medical attention at a hospital or doctor's office? Yes When did it last happen?  12 or 89 years old    If all above answers are NO, may  proceed with cephalosporin use.   Shellfish Allergy Anaphylaxis, Nausea And Vomiting, Other (See Comments)   Severe nausea and vomiting   Aminophylline  Itching, Swelling, Rash, Other (See Comments)   Pt experienced burning urination, itching, redness/rash, and swelling of genitals after given this medication via IV push.   Iodinated Contrast Media Swelling, Other (See Comments)   FLUSHING, also; LLE became swollen   Latex Other (See Comments)   Causes blisters   Oxybutynin Chloride Other (See Comments)   blisters   Propoxyphene Other (See Comments)   Unknown reaction   Vancomycin  Hives, Other (See Comments)   06/02/2018- Hives arm, back and chest   Adhesive [tape] Rash, Other (See Comments)   Paper tape is ok   Betadine [povidone Iodine] Rash   Ciprofloxacin Hives   Codeine  Nausea And Vomiting   .   Ranolazine  Other (See Comments)   Constipation .        Medication List        Accurate as of September 25, 2024 11:59 PM. If you have any questions, ask your nurse or doctor.          amLODipine  2.5 MG tablet Commonly known as: NORVASC  Take 1 tablet (2.5 mg total) by mouth daily.   cyanocobalamin  1000 MCG tablet Commonly known as: VITAMIN B12 Take 1 tablet (1,000 mcg total) by mouth daily.   Ensure High Protein Liqd Take 1 Can by mouth daily.   ezetimibe  10 MG tablet Commonly known as: ZETIA  Take 1 tablet (10 mg total) by mouth every morning.   gabapentin  300 MG capsule Commonly known as: NEURONTIN  Take 1 capsule (300 mg total) by mouth at bedtime.   latanoprost  0.005 % ophthalmic solution Commonly known as: XALATAN  Place 1 drop into both eyes at bedtime.   levothyroxine  75 MCG tablet Commonly known as: SYNTHROID  Take 1 tablet (75 mcg total) by mouth daily before breakfast.   magnesium  oxide 400 MG tablet Commonly known as: MAG-OX Take 1 tablet (400 mg total) by mouth daily.   nystatin  cream Commonly known as: MYCOSTATIN  Apply 1 Application topically 2  (two) times daily. Apply to affected area on abdominal folds and peri-area   OVER THE COUNTER MEDICATION Take 1 capsule by mouth daily. OmegaXL joint and muscle support   potassium chloride  SA 20 MEQ tablet Commonly known as: KLOR-CON  M Take 2 tablets by mouth in the morning and 1 tablet in the afternoon on Monday, Wednesday, and Friday (when you take torsemide  40 mg) Take 2 tablets in the morning and 2 tablets in the  afternoon on Tuesday, Thursday, Saturday, and Sunday (when you take torsemide  60 mg)   pravastatin  80 MG tablet Commonly known as: PRAVACHOL  Take 1 tablet (80 mg total) by mouth at bedtime.   Psyllium 28.3 % Powd Follow Package Directions. What changed:  when to take this reasons to take this   sertraline  25 MG tablet Commonly known as: ZOLOFT  Take 1 tablet (25 mg total) by mouth daily.   torsemide  20 MG tablet Commonly known as: DEMADEX  Take 2 tablets (40 mg) by mouth every Monday, Wednesday, and Friday and 3 tablets (60 mg) every Tuesday, Thursday, Saturday, and Sunday.   triamcinolone  cream 0.1 % Commonly known as: KENALOG  Apply 1 Application topically daily.        Review of Systems  Constitutional:  Negative for appetite change, chills, fatigue, fever and unexpected weight change.  HENT:  Positive for hearing loss and nosebleeds. Negative for congestion, dental problem, ear discharge, ear pain, postnasal drip, rhinorrhea, sinus pressure, sinus pain, sneezing, sore throat, tinnitus and trouble swallowing.   Eyes:  Negative for pain, discharge, redness, itching and visual disturbance.  Respiratory:  Negative for cough, chest tightness, shortness of breath and wheezing.   Cardiovascular:  Negative for chest pain, palpitations and leg swelling.  Gastrointestinal:  Negative for abdominal distention, abdominal pain, blood in stool, constipation, diarrhea, nausea and vomiting.  Endocrine: Negative for cold intolerance, heat intolerance, polydipsia, polyphagia  and polyuria.  Genitourinary:  Negative for difficulty urinating, dysuria, flank pain, frequency and urgency.  Musculoskeletal:  Positive for arthralgias and gait problem. Negative for back pain, joint swelling, myalgias, neck pain and neck stiffness.       Chronic right wrist pain   Skin:  Negative for color change, pallor, rash and wound.  Neurological:  Positive for numbness. Negative for dizziness, syncope, speech difficulty, weakness, light-headedness and headaches.       Numbness worst on feet  Hematological:  Does not bruise/bleed easily.  Psychiatric/Behavioral:  Negative for agitation, behavioral problems, confusion, hallucinations, self-injury, sleep disturbance and suicidal ideas. The patient is not nervous/anxious.     Immunization History  Administered Date(s) Administered   PFIZER(Purple Top)SARS-COV-2 Vaccination 11/11/2019, 11/13/2019, 12/08/2019   Pneumococcal Conjugate-13 06/14/2014   Pneumococcal Polysaccharide-23 10/26/2013, 03/30/2020   Tdap 12/19/2015, 11/12/2022   Pertinent  Health Maintenance Due  Topic Date Due   Influenza Vaccine  12/15/2024 (Originally 04/17/2024)   Bone Density Scan  Completed      12/06/2023    9:46 AM 03/25/2024   10:54 AM 07/20/2024   10:21 AM 07/20/2024   11:08 AM 09/25/2024   11:03 AM  Fall Risk  Falls in the past year? 1 0 0 0 0  Was there an injury with Fall? 0  0  0  0  0  Fall Risk Category Calculator 1 0 0 0 0  Patient at Risk for Falls Due to No Fall Risks No Fall Risks No Fall Risks History of fall(s) No Fall Risks  Fall risk Follow up Falls evaluation completed Falls evaluation completed Falls evaluation completed Falls evaluation completed Falls evaluation completed     Data saved with a previous flowsheet row definition   Functional Status Survey:    Vitals:   09/25/24 1108  BP: 122/60  Pulse: (!) 56  Resp: 18  Temp: 98 F (36.7 C)  SpO2: 97%  Weight: 188 lb 3.2 oz (85.4 kg)  Height: 5' 5.5 (1.664 m)   Body mass  index is 30.84 kg/m. Physical Exam  VITALS: T-  98.0, P- 56, BP- 122/60, SaO2- 97% MEASUREMENTS: Weight- 188, BMI- 30.0. GENERAL: Alert, cooperative, well developed, no acute distress. HEENT: Normocephalic, normal oropharynx, moist mucous membranes, cerumen accumulation in both ears, no sinus tenderness. NECK: Supple. CHEST: Clear to auscultation bilaterally, no wheezes, rhonchi, or crackles. CARDIOVASCULAR: Normal heart rate and rhythm, S1 and S2 normal without murmurs. ABDOMEN: Soft, non-tender, non-distended, without organomegaly, normal bowel sounds. EXTREMITIES: No cyanosis or edema, no pain or tenderness in extremities. NEUROLOGICAL: Cranial nerves grossly intact, moves all extremities without gross motor or sensory deficit.  Unsteady gait ambulates with rolling walker with seat SKIN: No rash or wounds.  PSYCHIATRY/BEHAVIORAL: Mood stable    Labs reviewed: Recent Labs    11/15/23 0738 11/16/23 1025 11/19/23 1117 02/25/24 1055 03/11/24 0947 05/26/24 1102 09/25/24 1216  NA 135 138 141   < > 144 142 145  K 4.1 4.7 4.3   < > 4.0 3.7 3.8  CL 95* 98 103   < > 101 104 103  CO2 29 30 31    < > 26 33* 33*  GLUCOSE 139* 204* 116   < > 136* 84 73  BUN 18 18 20    < > 15 12 19   CREATININE 0.83 0.86 0.99*   < > 0.93 0.87 0.79  CALCIUM 9.1 9.5 9.1   < > 9.2 9.0 9.0  MG 1.7 1.7 1.8  --   --   --   --    < > = values in this interval not displayed.   Recent Labs    11/14/23 0703 11/15/23 0738 11/19/23 1117 05/26/24 1102 09/25/24 1216  AST 50* 47* 42* 33 29  ALT 28 28 26 15 14   ALKPHOS 66 66  --  87  --   BILITOT 0.6 0.6 0.9 1.1 0.8  PROT 4.9* 5.3* 5.3* 6.3* 5.9*  ALBUMIN  2.1* 2.2*  --  3.6  --    Recent Labs    11/19/23 1117 05/26/24 1102 09/25/24 1216  WBC 5.9 4.0 4.9  NEUTROABS 4,289 2.3 3,053  HGB 14.1 14.1 13.7  HCT 42.2 41.7 41.6  MCV 100.7* 99.8 101.2  PLT 175 79* 96*   Lab Results  Component Value Date   TSH 6.73 (H) 09/25/2024   Lab Results  Component  Value Date   HGBA1C 6.1 (H) 01/26/2015   Lab Results  Component Value Date   CHOL 129 09/25/2024   HDL 60 09/25/2024   LDLCALC 49 09/25/2024   TRIG 114 09/25/2024   CHOLHDL 2.2 09/25/2024    Significant Diagnostic Results in last 30 days:  No results found.  Assessment/Plan  Chronic diastolic heart failure Reports shortness of breath, likely due to fluid retention. No signs of fluid overload  - Monitor weight at home and adjust diuretic dosage if weight increases. - Encouraged use of knee-high diabetic socks to manage leg swelling. - Continue on torsemide  20 mg tablet - Keep legs elevated when seated to keep swelling down  - check weight at least three times per week and notify provider for any abrupt weight gain > 3 lbs  - Reduce salt intake in diet   Essential hypertension Blood pressure is well-controlled at 122/60 mmHg. - Continue with dietary modification and exercise as tolerated - Continue on amlodipine  2.5 mg daily  Hypothyroidism Continues on levothyroxine  75 mcg daily, taken on an empty stomach. - Ordered thyroid  function tests.  Mixed hyperlipidemia Currently on Zetia  10 mg and pravastatin . - Ordered lipid panel.  Generalized anxiety disorder -Stable -  Continues on Zoloft  for anxiety and depression.  Urinary incontinence Experiences urinary incontinence, using nighttime pads 4-5 times a day. No use of pull-ups. - Skin care advised to prevent skin breakdown  Intertrigo Using nystatin  cream under the breast, which has cleared up the condition. Uses another cream as needed to maintain clearance. -Keep skin dry - Continue using nystatin  cream as needed.  Cerumen impaction Ears are filling with wax, obstructing view of the eardrum. - Use ear drops for four days and return for ear flushing.  General health maintenance Discussed dietary modifications to manage weight and improve overall health. Encouraged to reduce intake of pasta, white rice, and  potatoes, and increase protein and vegetable intake. Discussed the importance of maintaining weight to avoid mobility issues. - Encouraged dietary modifications to reduce intake of pasta, white rice, and potatoes. - Encouraged increased intake of protein and vegetables. - Continue protein supplement (Ensure) and consider switching to a multivitamin if weight gain is a concern.   Family/ staff Communication: Reviewed plan of care with patient verbalized understanding  Labs/tests ordered:  - CBC with Differential/Platelet - CMP with eGFR([facility]) - TSH - Lipid panel  Next Appointment : Return in about 6 months (around 03/25/2025) for medical mangement of chronic issues.[name]   Spent 30 minutes of Face to face and non-face to face with patient  >50% time spent counseling; reviewing medical record; tests; labs; documentation and developing future plan of care.   Dajuan Turnley C Taylin Leder, NP       [1]  Allergies Allergen Reactions   Atorvastatin Anaphylaxis and Other (See Comments)    Patient does not remember; caused pneumonia- took her off of it per patient   Penicillins Anaphylaxis, Hives, Swelling and Other (See Comments)    Tongue swelling Did it involve swelling of the face/tongue/throat, SOB, or low BP? Yes Did it involve sudden or severe rash/hives, skin peeling, or any reaction on the inside of your mouth or nose? Yes Did you need to seek medical attention at a hospital or doctor's office? Yes When did it last happen?  23 or 89 years old    If all above answers are NO, may proceed with cephalosporin use.    Shellfish Allergy Anaphylaxis, Nausea And Vomiting and Other (See Comments)    Severe nausea and vomiting   Aminophylline  Itching, Swelling, Rash and Other (See Comments)    Pt experienced burning urination, itching, redness/rash, and swelling of genitals after given this medication via IV push.   Iodinated Contrast Media Swelling and Other (See Comments)    FLUSHING, also; LLE  became swollen   Latex Other (See Comments)    Causes blisters   Oxybutynin Chloride Other (See Comments)    blisters   Propoxyphene Other (See Comments)    Unknown reaction   Vancomycin  Hives and Other (See Comments)    06/02/2018- Hives arm, back and chest   Adhesive [Tape] Rash and Other (See Comments)    Paper tape is ok   Betadine [Povidone Iodine] Rash   Ciprofloxacin Hives   Codeine  Nausea And Vomiting    .   Ranolazine  Other (See Comments)    Constipation  .   "

## 2024-09-26 LAB — LIPID PANEL
Cholesterol: 129 mg/dL
HDL: 60 mg/dL
LDL Cholesterol (Calc): 49 mg/dL
Non-HDL Cholesterol (Calc): 69 mg/dL
Total CHOL/HDL Ratio: 2.2 (calc)
Triglycerides: 114 mg/dL

## 2024-09-26 LAB — CBC WITH DIFFERENTIAL/PLATELET
Absolute Lymphocytes: 1088 {cells}/uL (ref 850–3900)
Absolute Monocytes: 392 {cells}/uL (ref 200–950)
Basophils Absolute: 29 {cells}/uL (ref 0–200)
Basophils Relative: 0.6 %
Eosinophils Absolute: 338 {cells}/uL (ref 15–500)
Eosinophils Relative: 6.9 %
HCT: 41.6 % (ref 35.9–46.0)
Hemoglobin: 13.7 g/dL (ref 11.7–15.5)
MCH: 33.3 pg — ABNORMAL HIGH (ref 27.0–33.0)
MCHC: 32.9 g/dL (ref 31.6–35.4)
MCV: 101.2 fL (ref 81.4–101.7)
MPV: 11.3 fL (ref 7.5–12.5)
Monocytes Relative: 8 %
Neutro Abs: 3053 {cells}/uL (ref 1500–7800)
Neutrophils Relative %: 62.3 %
Platelets: 96 Thousand/uL — ABNORMAL LOW (ref 140–400)
RBC: 4.11 Million/uL (ref 3.80–5.10)
RDW: 12.3 % (ref 11.0–15.0)
Total Lymphocyte: 22.2 %
WBC: 4.9 Thousand/uL (ref 3.8–10.8)

## 2024-09-26 LAB — COMPLETE METABOLIC PANEL WITHOUT GFR
AG Ratio: 1.7 (calc) (ref 1.0–2.5)
ALT: 14 U/L (ref 6–29)
AST: 29 U/L (ref 10–35)
Albumin: 3.7 g/dL (ref 3.6–5.1)
Alkaline phosphatase (APISO): 97 U/L (ref 37–153)
BUN: 19 mg/dL (ref 7–25)
CO2: 33 mmol/L — ABNORMAL HIGH (ref 20–32)
Calcium: 9 mg/dL (ref 8.6–10.4)
Chloride: 103 mmol/L (ref 98–110)
Creat: 0.79 mg/dL (ref 0.60–0.95)
Globulin: 2.2 g/dL (ref 1.9–3.7)
Glucose, Bld: 73 mg/dL (ref 65–139)
Potassium: 3.8 mmol/L (ref 3.5–5.3)
Sodium: 145 mmol/L (ref 135–146)
Total Bilirubin: 0.8 mg/dL (ref 0.2–1.2)
Total Protein: 5.9 g/dL — ABNORMAL LOW (ref 6.1–8.1)

## 2024-09-26 LAB — TSH: TSH: 6.73 m[IU]/L — ABNORMAL HIGH (ref 0.40–4.50)

## 2024-09-30 ENCOUNTER — Other Ambulatory Visit: Payer: Self-pay

## 2024-09-30 ENCOUNTER — Other Ambulatory Visit: Payer: Self-pay | Admitting: Family

## 2024-09-30 DIAGNOSIS — F411 Generalized anxiety disorder: Secondary | ICD-10-CM

## 2024-09-30 MED ORDER — SERTRALINE HCL 25 MG PO TABS
25.0000 mg | ORAL_TABLET | Freq: Every day | ORAL | 3 refills | Status: AC
  Filled 2024-09-30: qty 90, 90d supply, fill #0

## 2024-10-01 ENCOUNTER — Ambulatory Visit: Payer: Self-pay | Admitting: Family

## 2024-10-01 DIAGNOSIS — E039 Hypothyroidism, unspecified: Secondary | ICD-10-CM

## 2024-10-01 NOTE — Telephone Encounter (Signed)
 Copied from CRM #[id number]. Topic: Clinical - Lab/Test Results >> Oct 01, 2024  1:03 PM Jill Preston wrote: Reason for CRM: Patient is returning phone call regarding the lab work, patient stated she is still taking the Levothyroxine  how Dinah described, patient is okay with follow up labs in 8 weeks, please notify the patient when the orders are in for scheduling. Patient had no further questions.

## 2024-10-01 NOTE — Addendum Note (Signed)
 Addended by: RENELLA KEEN on: 10/01/2024 01:42 PM   Modules accepted: Orders

## 2024-10-09 ENCOUNTER — Other Ambulatory Visit: Payer: Self-pay

## 2024-10-13 ENCOUNTER — Other Ambulatory Visit: Payer: Self-pay

## 2024-10-15 ENCOUNTER — Other Ambulatory Visit: Payer: Self-pay

## 2024-11-26 ENCOUNTER — Other Ambulatory Visit

## 2025-03-25 ENCOUNTER — Ambulatory Visit: Admitting: Family

## 2025-06-01 ENCOUNTER — Other Ambulatory Visit

## 2025-06-11 ENCOUNTER — Ambulatory Visit: Admitting: Hematology and Oncology

## 2025-07-22 ENCOUNTER — Ambulatory Visit: Admitting: Family
